# Patient Record
Sex: Female | Born: 1935 | Race: White | Hispanic: No | Marital: Married | Smoking: Former smoker
Health system: Southern US, Community
[De-identification: ages and names within clinical notes are randomized; demographics above are authoritative.]

## PROBLEM LIST (undated history)

## (undated) DIAGNOSIS — E039 Hypothyroidism, unspecified: Secondary | ICD-10-CM

## (undated) DIAGNOSIS — K589 Irritable bowel syndrome without diarrhea: Secondary | ICD-10-CM

## (undated) DIAGNOSIS — K649 Unspecified hemorrhoids: Secondary | ICD-10-CM

## (undated) DIAGNOSIS — N3281 Overactive bladder: Secondary | ICD-10-CM

## (undated) DIAGNOSIS — M1990 Unspecified osteoarthritis, unspecified site: Secondary | ICD-10-CM

## (undated) DIAGNOSIS — M7120 Synovial cyst of popliteal space [Baker], unspecified knee: Secondary | ICD-10-CM

## (undated) DIAGNOSIS — E785 Hyperlipidemia, unspecified: Secondary | ICD-10-CM

## (undated) DIAGNOSIS — K635 Polyp of colon: Secondary | ICD-10-CM

## (undated) DIAGNOSIS — S83209A Unspecified tear of unspecified meniscus, current injury, unspecified knee, initial encounter: Secondary | ICD-10-CM

## (undated) DIAGNOSIS — I509 Heart failure, unspecified: Secondary | ICD-10-CM

## (undated) DIAGNOSIS — K219 Gastro-esophageal reflux disease without esophagitis: Secondary | ICD-10-CM

## (undated) DIAGNOSIS — R011 Cardiac murmur, unspecified: Secondary | ICD-10-CM

## (undated) DIAGNOSIS — M461 Sacroiliitis, not elsewhere classified: Secondary | ICD-10-CM

## (undated) DIAGNOSIS — M47816 Spondylosis without myelopathy or radiculopathy, lumbar region: Secondary | ICD-10-CM

## (undated) DIAGNOSIS — I341 Nonrheumatic mitral (valve) prolapse: Secondary | ICD-10-CM

## (undated) DIAGNOSIS — K2970 Gastritis, unspecified, without bleeding: Secondary | ICD-10-CM

## (undated) DIAGNOSIS — N952 Postmenopausal atrophic vaginitis: Secondary | ICD-10-CM

## (undated) DIAGNOSIS — D219 Benign neoplasm of connective and other soft tissue, unspecified: Secondary | ICD-10-CM

## (undated) DIAGNOSIS — E041 Nontoxic single thyroid nodule: Secondary | ICD-10-CM

## (undated) DIAGNOSIS — E079 Disorder of thyroid, unspecified: Secondary | ICD-10-CM

## (undated) DIAGNOSIS — J45909 Unspecified asthma, uncomplicated: Secondary | ICD-10-CM

## (undated) HISTORY — DX: Synovial cyst of popliteal space (Baker), unspecified knee: M71.20

## (undated) HISTORY — DX: Unspecified tear of unspecified meniscus, current injury, unspecified knee, initial encounter: S83.209A

## (undated) HISTORY — DX: Nonrheumatic mitral (valve) prolapse: I34.1

## (undated) HISTORY — DX: Benign neoplasm of connective and other soft tissue, unspecified: D21.9

## (undated) HISTORY — DX: Polyp of colon: K63.5

## (undated) HISTORY — PX: EYE SURGERY: SHX253

## (undated) HISTORY — DX: Gastritis, unspecified, without bleeding: K29.70

## (undated) HISTORY — DX: Gastro-esophageal reflux disease without esophagitis: K21.9

## (undated) HISTORY — DX: Unspecified hemorrhoids: K64.9

## (undated) HISTORY — DX: Spondylosis without myelopathy or radiculopathy, lumbar region: M47.816

## (undated) HISTORY — DX: Overactive bladder: N32.81

## (undated) HISTORY — PX: TONSILLECTOMY: SUR1361

## (undated) HISTORY — DX: Postmenopausal atrophic vaginitis: N95.2

## (undated) HISTORY — DX: Cardiac murmur, unspecified: R01.1

## (undated) HISTORY — DX: Irritable bowel syndrome, unspecified: K58.9

## (undated) HISTORY — DX: Hyperlipidemia, unspecified: E78.5

## (undated) HISTORY — DX: Unspecified osteoarthritis, unspecified site: M19.90

## (undated) HISTORY — DX: Sacroiliitis, not elsewhere classified: M46.1

## (undated) HISTORY — DX: Nontoxic single thyroid nodule: E04.1

## (undated) HISTORY — PX: CATARACT EXTRACTION, BILATERAL: SHX1313

## (undated) HISTORY — DX: Disorder of thyroid, unspecified: E07.9

---

## 1993-10-05 ENCOUNTER — Encounter (INDEPENDENT_AMBULATORY_CARE_PROVIDER_SITE_OTHER): Payer: Self-pay | Admitting: *Deleted

## 1998-03-11 ENCOUNTER — Encounter (INDEPENDENT_AMBULATORY_CARE_PROVIDER_SITE_OTHER): Payer: Self-pay | Admitting: *Deleted

## 1999-01-12 ENCOUNTER — Other Ambulatory Visit: Admission: RE | Admit: 1999-01-12 | Discharge: 1999-01-12 | Payer: Self-pay | Admitting: Obstetrics and Gynecology

## 1999-04-21 ENCOUNTER — Ambulatory Visit (HOSPITAL_COMMUNITY): Admission: RE | Admit: 1999-04-21 | Discharge: 1999-04-21 | Payer: Self-pay | Admitting: Gastroenterology

## 1999-04-21 ENCOUNTER — Encounter: Payer: Self-pay | Admitting: Gastroenterology

## 1999-06-11 ENCOUNTER — Encounter (INDEPENDENT_AMBULATORY_CARE_PROVIDER_SITE_OTHER): Payer: Self-pay | Admitting: *Deleted

## 2000-01-01 ENCOUNTER — Other Ambulatory Visit: Admission: RE | Admit: 2000-01-01 | Discharge: 2000-01-01 | Payer: Self-pay | Admitting: Obstetrics and Gynecology

## 2000-03-17 ENCOUNTER — Encounter: Admission: RE | Admit: 2000-03-17 | Discharge: 2000-03-17 | Payer: Self-pay | Admitting: Obstetrics and Gynecology

## 2000-03-17 ENCOUNTER — Encounter: Payer: Self-pay | Admitting: Obstetrics and Gynecology

## 2001-01-03 ENCOUNTER — Other Ambulatory Visit: Admission: RE | Admit: 2001-01-03 | Discharge: 2001-01-03 | Payer: Self-pay | Admitting: Obstetrics and Gynecology

## 2001-03-20 ENCOUNTER — Encounter: Payer: Self-pay | Admitting: Obstetrics and Gynecology

## 2001-03-20 ENCOUNTER — Encounter: Admission: RE | Admit: 2001-03-20 | Discharge: 2001-03-20 | Payer: Self-pay | Admitting: Obstetrics and Gynecology

## 2001-06-27 ENCOUNTER — Encounter (INDEPENDENT_AMBULATORY_CARE_PROVIDER_SITE_OTHER): Payer: Self-pay | Admitting: Gastroenterology

## 2002-01-08 ENCOUNTER — Other Ambulatory Visit: Admission: RE | Admit: 2002-01-08 | Discharge: 2002-01-08 | Payer: Self-pay | Admitting: Obstetrics and Gynecology

## 2002-03-22 ENCOUNTER — Encounter: Admission: RE | Admit: 2002-03-22 | Discharge: 2002-03-22 | Payer: Self-pay | Admitting: Obstetrics and Gynecology

## 2002-03-22 ENCOUNTER — Encounter: Payer: Self-pay | Admitting: Obstetrics and Gynecology

## 2002-08-24 ENCOUNTER — Encounter (INDEPENDENT_AMBULATORY_CARE_PROVIDER_SITE_OTHER): Payer: Self-pay | Admitting: *Deleted

## 2002-08-24 ENCOUNTER — Encounter: Admission: RE | Admit: 2002-08-24 | Discharge: 2002-08-24 | Payer: Self-pay | Admitting: Gastroenterology

## 2002-08-24 ENCOUNTER — Encounter: Payer: Self-pay | Admitting: Gastroenterology

## 2002-09-04 ENCOUNTER — Encounter (INDEPENDENT_AMBULATORY_CARE_PROVIDER_SITE_OTHER): Payer: Self-pay | Admitting: Gastroenterology

## 2002-10-22 ENCOUNTER — Encounter: Admission: RE | Admit: 2002-10-22 | Discharge: 2002-10-22 | Payer: Self-pay | Admitting: Neurology

## 2002-10-22 ENCOUNTER — Encounter: Payer: Self-pay | Admitting: Neurology

## 2002-11-15 ENCOUNTER — Encounter: Admission: RE | Admit: 2002-11-15 | Discharge: 2002-12-13 | Payer: Self-pay | Admitting: Neurology

## 2003-03-12 ENCOUNTER — Other Ambulatory Visit: Admission: RE | Admit: 2003-03-12 | Discharge: 2003-03-12 | Payer: Self-pay | Admitting: Obstetrics and Gynecology

## 2003-03-26 ENCOUNTER — Encounter: Payer: Self-pay | Admitting: Obstetrics and Gynecology

## 2003-03-26 ENCOUNTER — Encounter: Admission: RE | Admit: 2003-03-26 | Discharge: 2003-03-26 | Payer: Self-pay | Admitting: Obstetrics and Gynecology

## 2003-04-25 ENCOUNTER — Observation Stay (HOSPITAL_COMMUNITY): Admission: EM | Admit: 2003-04-25 | Discharge: 2003-04-25 | Payer: Self-pay | Admitting: Emergency Medicine

## 2003-04-25 ENCOUNTER — Encounter: Payer: Self-pay | Admitting: Emergency Medicine

## 2004-03-17 ENCOUNTER — Other Ambulatory Visit: Admission: RE | Admit: 2004-03-17 | Discharge: 2004-03-17 | Payer: Self-pay | Admitting: Obstetrics and Gynecology

## 2004-03-31 ENCOUNTER — Encounter: Admission: RE | Admit: 2004-03-31 | Discharge: 2004-03-31 | Payer: Self-pay | Admitting: Obstetrics and Gynecology

## 2004-10-19 ENCOUNTER — Ambulatory Visit: Payer: Self-pay | Admitting: Internal Medicine

## 2004-12-16 ENCOUNTER — Ambulatory Visit: Payer: Self-pay | Admitting: Gastroenterology

## 2005-01-08 ENCOUNTER — Ambulatory Visit (HOSPITAL_COMMUNITY): Admission: RE | Admit: 2005-01-08 | Discharge: 2005-01-08 | Payer: Self-pay | Admitting: Gastroenterology

## 2005-01-08 ENCOUNTER — Encounter (INDEPENDENT_AMBULATORY_CARE_PROVIDER_SITE_OTHER): Payer: Self-pay | Admitting: Gastroenterology

## 2005-04-02 ENCOUNTER — Ambulatory Visit: Payer: Self-pay | Admitting: Internal Medicine

## 2005-04-06 ENCOUNTER — Ambulatory Visit: Payer: Self-pay | Admitting: Internal Medicine

## 2005-04-20 ENCOUNTER — Encounter: Admission: RE | Admit: 2005-04-20 | Discharge: 2005-04-20 | Payer: Self-pay | Admitting: Obstetrics and Gynecology

## 2005-05-03 ENCOUNTER — Ambulatory Visit: Payer: Self-pay | Admitting: Internal Medicine

## 2005-05-07 ENCOUNTER — Ambulatory Visit: Payer: Self-pay | Admitting: Internal Medicine

## 2005-05-10 ENCOUNTER — Ambulatory Visit: Payer: Self-pay | Admitting: Internal Medicine

## 2005-05-10 ENCOUNTER — Ambulatory Visit: Payer: Self-pay | Admitting: Cardiology

## 2005-05-13 ENCOUNTER — Ambulatory Visit: Payer: Self-pay | Admitting: Endocrinology

## 2005-05-17 ENCOUNTER — Ambulatory Visit: Payer: Self-pay | Admitting: Endocrinology

## 2005-05-17 ENCOUNTER — Ambulatory Visit: Payer: Self-pay | Admitting: Internal Medicine

## 2005-06-07 ENCOUNTER — Ambulatory Visit: Payer: Self-pay | Admitting: Endocrinology

## 2005-06-07 ENCOUNTER — Ambulatory Visit: Payer: Self-pay | Admitting: Internal Medicine

## 2005-06-07 ENCOUNTER — Other Ambulatory Visit: Admission: RE | Admit: 2005-06-07 | Discharge: 2005-06-07 | Payer: Self-pay | Admitting: Obstetrics and Gynecology

## 2005-06-14 ENCOUNTER — Ambulatory Visit: Payer: Self-pay | Admitting: Internal Medicine

## 2005-06-30 ENCOUNTER — Ambulatory Visit: Payer: Self-pay | Admitting: Gastroenterology

## 2005-09-29 ENCOUNTER — Ambulatory Visit: Payer: Self-pay | Admitting: Internal Medicine

## 2005-10-04 ENCOUNTER — Ambulatory Visit: Payer: Self-pay | Admitting: Internal Medicine

## 2006-05-11 ENCOUNTER — Encounter: Admission: RE | Admit: 2006-05-11 | Discharge: 2006-05-11 | Payer: Self-pay | Admitting: Obstetrics and Gynecology

## 2006-05-30 ENCOUNTER — Ambulatory Visit: Payer: Self-pay | Admitting: Internal Medicine

## 2006-06-08 ENCOUNTER — Other Ambulatory Visit: Admission: RE | Admit: 2006-06-08 | Discharge: 2006-06-08 | Payer: Self-pay | Admitting: Obstetrics and Gynecology

## 2006-06-08 ENCOUNTER — Ambulatory Visit: Payer: Self-pay | Admitting: Internal Medicine

## 2006-07-14 ENCOUNTER — Ambulatory Visit: Payer: Self-pay | Admitting: *Deleted

## 2006-08-12 ENCOUNTER — Encounter (INDEPENDENT_AMBULATORY_CARE_PROVIDER_SITE_OTHER): Payer: Self-pay | Admitting: *Deleted

## 2006-08-12 ENCOUNTER — Ambulatory Visit (HOSPITAL_BASED_OUTPATIENT_CLINIC_OR_DEPARTMENT_OTHER): Admission: RE | Admit: 2006-08-12 | Discharge: 2006-08-12 | Payer: Self-pay | Admitting: Plastic Surgery

## 2006-09-14 ENCOUNTER — Ambulatory Visit: Payer: Self-pay | Admitting: Gastroenterology

## 2006-09-26 ENCOUNTER — Ambulatory Visit: Payer: Self-pay | Admitting: Internal Medicine

## 2006-11-10 ENCOUNTER — Ambulatory Visit: Payer: Self-pay | Admitting: Gastroenterology

## 2006-11-10 LAB — HM COLONOSCOPY

## 2006-12-05 ENCOUNTER — Ambulatory Visit: Payer: Self-pay | Admitting: Internal Medicine

## 2006-12-05 LAB — CONVERTED CEMR LAB
BUN: 19 mg/dL (ref 6–23)
Chol/HDL Ratio, serum: 2.6
Cholesterol: 135 mg/dL (ref 0–200)
Creatinine, Ser: 0.7 mg/dL (ref 0.4–1.2)
Glucose, Bld: 97 mg/dL (ref 70–99)
HDL: 51.1 mg/dL (ref >39.0)
Hgb A1c MFr Bld: 6 % (ref 4.6–6.0)
LDL Cholesterol: 63 mg/dL (ref 0–99)
Potassium: 3.9 meq/L (ref 3.5–5.1)
Triglyceride fasting, serum: 105 mg/dL (ref 0–149)
VLDL: 21 mg/dL (ref 0–40)

## 2006-12-09 ENCOUNTER — Ambulatory Visit: Payer: Self-pay | Admitting: Internal Medicine

## 2006-12-12 ENCOUNTER — Ambulatory Visit: Payer: Self-pay | Admitting: Internal Medicine

## 2006-12-12 LAB — CONVERTED CEMR LAB
ALT: 31 units/L (ref 0–40)
AST: 34 units/L (ref 0–37)
Bilirubin Urine: NEGATIVE
Hemoglobin, Urine: NEGATIVE
Ketones, ur: NEGATIVE mg/dL
Leukocytes, UA: NEGATIVE
Nitrite: NEGATIVE
Specific Gravity, Urine: 1.02 (ref 1.000–1.03)
TSH: 3.89 microintl units/mL (ref 0.35–5.50)
Total Bilirubin: 0.7 mg/dL (ref 0.3–1.2)
Total Protein, Urine: NEGATIVE mg/dL
Urine Glucose: NEGATIVE mg/dL
Urobilinogen, UA: 0.2 (ref 0.0–1.0)
Vit D, 1,25-Dihydroxy: 54 (ref 20–57)
pH: 6 (ref 5.0–8.0)

## 2006-12-15 ENCOUNTER — Ambulatory Visit: Payer: Self-pay | Admitting: *Deleted

## 2007-03-28 ENCOUNTER — Ambulatory Visit: Payer: Self-pay | Admitting: *Deleted

## 2007-03-30 ENCOUNTER — Ambulatory Visit: Payer: Self-pay

## 2007-04-26 ENCOUNTER — Ambulatory Visit: Payer: Self-pay | Admitting: *Deleted

## 2007-04-26 LAB — CONVERTED CEMR LAB
ALT: 22 units/L (ref 0–40)
AST: 26 units/L (ref 0–37)
Albumin: 3.6 g/dL (ref 3.5–5.2)
Alkaline Phosphatase: 39 units/L (ref 39–117)
Bilirubin, Direct: 0.1 mg/dL (ref 0.0–0.3)
Cholesterol: 178 mg/dL (ref 0–200)
HDL: 47.4 mg/dL (ref >39.0)
LDL Cholesterol: 111 mg/dL — ABNORMAL HIGH (ref 0–99)
Total Bilirubin: 0.6 mg/dL (ref 0.3–1.2)
Total CHOL/HDL Ratio: 3.8
Total Protein: 7 g/dL (ref 6.0–8.3)
Triglycerides: 96 mg/dL (ref 0–149)
VLDL: 19 mg/dL (ref 0–40)

## 2007-05-03 ENCOUNTER — Ambulatory Visit: Payer: Self-pay | Admitting: *Deleted

## 2007-05-17 ENCOUNTER — Encounter: Admission: RE | Admit: 2007-05-17 | Discharge: 2007-05-17 | Payer: Self-pay | Admitting: Obstetrics and Gynecology

## 2007-06-14 ENCOUNTER — Other Ambulatory Visit: Admission: RE | Admit: 2007-06-14 | Discharge: 2007-06-14 | Payer: Self-pay | Admitting: Obstetrics and Gynecology

## 2007-08-03 ENCOUNTER — Ambulatory Visit: Payer: Self-pay | Admitting: Internal Medicine

## 2007-08-03 LAB — CONVERTED CEMR LAB
ALT: 29 units/L (ref 0–35)
AST: 28 units/L (ref 0–37)
Albumin: 3.8 g/dL (ref 3.5–5.2)
Alkaline Phosphatase: 41 units/L (ref 39–117)
Bilirubin, Direct: 0.1 mg/dL (ref 0.0–0.3)
Cholesterol: 164 mg/dL (ref 0–200)
HDL: 50.1 mg/dL (ref >39.0)
LDL Cholesterol: 98 mg/dL (ref 0–99)
Total Bilirubin: 0.7 mg/dL (ref 0.3–1.2)
Total CHOL/HDL Ratio: 3.3
Total Protein: 7.5 g/dL (ref 6.0–8.3)
Triglycerides: 82 mg/dL (ref 0–149)
VLDL: 16 mg/dL (ref 0–40)

## 2007-08-08 ENCOUNTER — Ambulatory Visit: Payer: Self-pay | Admitting: Internal Medicine

## 2007-08-08 LAB — CONVERTED CEMR LAB
ALT: 26 units/L (ref 0–35)
AST: 26 units/L (ref 0–37)
Albumin: 4 g/dL (ref 3.5–5.2)
Alkaline Phosphatase: 47 units/L (ref 39–117)
BUN: 21 mg/dL (ref 6–23)
Basophils Absolute: 0 10*3/uL (ref 0.0–0.1)
Basophils Relative: 0.6 % (ref 0.0–1.0)
Bilirubin Urine: NEGATIVE
Bilirubin, Direct: 0.1 mg/dL (ref 0.0–0.3)
CO2: 31 meq/L (ref 19–32)
Calcium: 9.8 mg/dL (ref 8.4–10.5)
Chloride: 102 meq/L (ref 96–112)
Creatinine, Ser: 0.7 mg/dL (ref 0.4–1.2)
Crystals: NEGATIVE
Eosinophils Absolute: 0.1 10*3/uL (ref 0.0–0.6)
Eosinophils Relative: 1.8 % (ref 0.0–5.0)
FSH: 60 milliintl units/mL
GFR calc Af Amer: 107 mL/min
GFR calc non Af Amer: 88 mL/min
Glucose, Bld: 91 mg/dL (ref 70–99)
HCT: 37.7 % (ref 36.0–46.0)
Hemoglobin, Urine: NEGATIVE
Hemoglobin: 13 g/dL (ref 12.0–15.0)
Ketones, ur: NEGATIVE mg/dL
Leukocytes, UA: NEGATIVE
Lymphocytes Relative: 25.7 % (ref 12.0–46.0)
MCHC: 34.5 g/dL (ref 30.0–36.0)
MCV: 92 fL (ref 78.0–100.0)
Monocytes Absolute: 0.6 10*3/uL (ref 0.2–0.7)
Monocytes Relative: 9.4 % (ref 3.0–11.0)
Mucus, UA: NEGATIVE
Neutro Abs: 4.4 10*3/uL (ref 1.4–7.7)
Neutrophils Relative %: 62.5 % (ref 43.0–77.0)
Nitrite: NEGATIVE
Platelets: 292 10*3/uL (ref 150–400)
Potassium: 3.9 meq/L (ref 3.5–5.1)
RBC / HPF: NONE SEEN
RBC: 4.1 M/uL (ref 3.87–5.11)
RDW: 12.2 % (ref 11.5–14.6)
Sed Rate: 23 mm/hr (ref 0–25)
Sodium: 141 meq/L (ref 135–145)
Specific Gravity, Urine: 1.015 (ref 1.000–1.03)
TSH: 4.18 microintl units/mL (ref 0.35–5.50)
Total Bilirubin: 0.7 mg/dL (ref 0.3–1.2)
Total Protein, Urine: NEGATIVE mg/dL
Total Protein: 7.7 g/dL (ref 6.0–8.3)
Urine Glucose: NEGATIVE mg/dL
Urobilinogen, UA: 0.2 (ref 0.0–1.0)
WBC, UA: NONE SEEN cells/hpf
WBC: 6.8 10*3/uL (ref 4.5–10.5)
pH: 6.5 (ref 5.0–8.0)

## 2007-09-13 ENCOUNTER — Ambulatory Visit: Payer: Self-pay | Admitting: Internal Medicine

## 2007-09-20 ENCOUNTER — Ambulatory Visit: Payer: Self-pay

## 2007-09-29 ENCOUNTER — Ambulatory Visit: Payer: Self-pay | Admitting: Internal Medicine

## 2007-11-20 ENCOUNTER — Encounter: Payer: Self-pay | Admitting: Internal Medicine

## 2007-11-20 DIAGNOSIS — M810 Age-related osteoporosis without current pathological fracture: Secondary | ICD-10-CM

## 2007-11-20 DIAGNOSIS — K219 Gastro-esophageal reflux disease without esophagitis: Secondary | ICD-10-CM

## 2007-11-20 DIAGNOSIS — E739 Lactose intolerance, unspecified: Secondary | ICD-10-CM

## 2007-11-20 DIAGNOSIS — E785 Hyperlipidemia, unspecified: Secondary | ICD-10-CM | POA: Insufficient documentation

## 2007-11-20 HISTORY — DX: Lactose intolerance, unspecified: E73.9

## 2007-11-20 HISTORY — DX: Gastro-esophageal reflux disease without esophagitis: K21.9

## 2007-11-20 HISTORY — DX: Age-related osteoporosis without current pathological fracture: M81.0

## 2008-01-01 ENCOUNTER — Encounter: Payer: Self-pay | Admitting: Internal Medicine

## 2008-02-20 ENCOUNTER — Telehealth: Payer: Self-pay | Admitting: Internal Medicine

## 2008-02-21 ENCOUNTER — Ambulatory Visit: Payer: Self-pay | Admitting: Internal Medicine

## 2008-02-21 DIAGNOSIS — R61 Generalized hyperhidrosis: Secondary | ICD-10-CM | POA: Insufficient documentation

## 2008-02-21 LAB — CONVERTED CEMR LAB
ALT: 27 units/L (ref 0–35)
AST: 28 units/L (ref 0–37)
Albumin: 3.8 g/dL (ref 3.5–5.2)
Alkaline Phosphatase: 38 units/L — ABNORMAL LOW (ref 39–117)
BUN: 18 mg/dL (ref 6–23)
Bilirubin, Direct: 0.1 mg/dL (ref 0.0–0.3)
CO2: 30 meq/L (ref 19–32)
Calcium: 9.1 mg/dL (ref 8.4–10.5)
Chloride: 107 meq/L (ref 96–112)
Cholesterol: 144 mg/dL (ref 0–200)
Creatinine, Ser: 0.8 mg/dL (ref 0.4–1.2)
GFR calc Af Amer: 91 mL/min
GFR calc non Af Amer: 76 mL/min
Glucose, Bld: 97 mg/dL (ref 70–99)
HDL: 43.8 mg/dL (ref >39.0)
LDL Cholesterol: 81 mg/dL (ref 0–99)
Potassium: 4.5 meq/L (ref 3.5–5.1)
Sodium: 141 meq/L (ref 135–145)
TSH: 3.98 microintl units/mL (ref 0.35–5.50)
Total Bilirubin: 0.7 mg/dL (ref 0.3–1.2)
Total CHOL/HDL Ratio: 3.3
Total Protein: 7.2 g/dL (ref 6.0–8.3)
Triglycerides: 94 mg/dL (ref 0–149)
VLDL: 19 mg/dL (ref 0–40)

## 2008-02-25 DIAGNOSIS — M1990 Unspecified osteoarthritis, unspecified site: Secondary | ICD-10-CM

## 2008-02-25 HISTORY — DX: Unspecified osteoarthritis, unspecified site: M19.90

## 2008-03-20 ENCOUNTER — Ambulatory Visit: Payer: Self-pay | Admitting: Sports Medicine

## 2008-03-20 DIAGNOSIS — M76829 Posterior tibial tendinitis, unspecified leg: Secondary | ICD-10-CM | POA: Insufficient documentation

## 2008-03-20 HISTORY — DX: Posterior tibial tendinitis, unspecified leg: M76.829

## 2008-03-29 ENCOUNTER — Ambulatory Visit: Payer: Self-pay | Admitting: Sports Medicine

## 2008-04-10 ENCOUNTER — Ambulatory Visit: Payer: Self-pay | Admitting: Internal Medicine

## 2008-05-20 ENCOUNTER — Encounter: Admission: RE | Admit: 2008-05-20 | Discharge: 2008-05-20 | Payer: Self-pay | Admitting: Obstetrics and Gynecology

## 2008-05-30 ENCOUNTER — Encounter: Payer: Self-pay | Admitting: Internal Medicine

## 2008-06-17 ENCOUNTER — Other Ambulatory Visit: Admission: RE | Admit: 2008-06-17 | Discharge: 2008-06-17 | Payer: Self-pay | Admitting: Obstetrics and Gynecology

## 2008-08-21 ENCOUNTER — Ambulatory Visit: Payer: Self-pay | Admitting: Internal Medicine

## 2008-08-22 ENCOUNTER — Ambulatory Visit: Payer: Self-pay | Admitting: Internal Medicine

## 2008-08-23 LAB — CONVERTED CEMR LAB
ALT: 26 units/L (ref 0–35)
AST: 30 units/L (ref 0–37)
Albumin: 3.9 g/dL (ref 3.5–5.2)
Alkaline Phosphatase: 43 units/L (ref 39–117)
BUN: 18 mg/dL (ref 6–23)
Basophils Absolute: 0 10*3/uL (ref 0.0–0.1)
Basophils Relative: 0.4 % (ref 0.0–3.0)
Bilirubin Urine: NEGATIVE
Bilirubin, Direct: 0.2 mg/dL (ref 0.0–0.3)
CO2: 30 meq/L (ref 19–32)
Calcium: 9.2 mg/dL (ref 8.4–10.5)
Chloride: 107 meq/L (ref 96–112)
Cholesterol: 175 mg/dL (ref 0–200)
Creatinine, Ser: 0.8 mg/dL (ref 0.4–1.2)
Eosinophils Absolute: 0.2 10*3/uL (ref 0.0–0.7)
Eosinophils Relative: 3.1 % (ref 0.0–5.0)
GFR calc Af Amer: 91 mL/min
GFR calc non Af Amer: 75 mL/min
Glucose, Bld: 93 mg/dL (ref 70–99)
HCT: 40.6 % (ref 36.0–46.0)
HDL: 53.8 mg/dL (ref >39.0)
Hemoglobin, Urine: NEGATIVE
Hemoglobin: 13.9 g/dL (ref 12.0–15.0)
Ketones, ur: NEGATIVE mg/dL
LDL Cholesterol: 100 mg/dL — ABNORMAL HIGH (ref 0–99)
Leukocytes, UA: NEGATIVE
Lymphocytes Relative: 15.8 % (ref 12.0–46.0)
MCHC: 34.1 g/dL (ref 30.0–36.0)
MCV: 92.6 fL (ref 78.0–100.0)
Monocytes Absolute: 0.7 10*3/uL (ref 0.1–1.0)
Monocytes Relative: 10.5 % (ref 3.0–12.0)
Neutro Abs: 4.5 10*3/uL (ref 1.4–7.7)
Neutrophils Relative %: 70.2 % (ref 43.0–77.0)
Nitrite: NEGATIVE
Platelets: 275 10*3/uL (ref 150–400)
Potassium: 4.4 meq/L (ref 3.5–5.1)
RBC: 4.39 M/uL (ref 3.87–5.11)
RDW: 13 % (ref 11.5–14.6)
Sodium: 142 meq/L (ref 135–145)
Specific Gravity, Urine: <=1.005 (ref 1.000–1.03)
TSH: 4.06 microintl units/mL (ref 0.35–5.50)
Total Bilirubin: 0.8 mg/dL (ref 0.3–1.2)
Total CHOL/HDL Ratio: 3.3
Total Protein, Urine: NEGATIVE mg/dL
Total Protein: 7.1 g/dL (ref 6.0–8.3)
Triglycerides: 105 mg/dL (ref 0–149)
Urine Glucose: NEGATIVE mg/dL
Urobilinogen, UA: 0.2 (ref 0.0–1.0)
VLDL: 21 mg/dL (ref 0–40)
WBC: 6.4 10*3/uL (ref 4.5–10.5)
pH: 7 (ref 5.0–8.0)

## 2008-09-03 DIAGNOSIS — K2940 Chronic atrophic gastritis without bleeding: Secondary | ICD-10-CM

## 2008-09-03 DIAGNOSIS — K2980 Duodenitis without bleeding: Secondary | ICD-10-CM | POA: Insufficient documentation

## 2008-09-03 HISTORY — DX: Chronic atrophic gastritis without bleeding: K29.40

## 2008-09-03 HISTORY — DX: Duodenitis without bleeding: K29.80

## 2008-09-04 ENCOUNTER — Ambulatory Visit: Payer: Self-pay | Admitting: Internal Medicine

## 2008-09-04 DIAGNOSIS — Z860100 Personal history of colon polyps, unspecified: Secondary | ICD-10-CM

## 2008-09-04 DIAGNOSIS — K589 Irritable bowel syndrome without diarrhea: Secondary | ICD-10-CM | POA: Insufficient documentation

## 2008-09-04 HISTORY — DX: Personal history of colon polyps, unspecified: Z86.0100

## 2008-09-04 HISTORY — DX: Irritable bowel syndrome, unspecified: K58.9

## 2008-10-01 ENCOUNTER — Encounter: Payer: Self-pay | Admitting: Internal Medicine

## 2008-10-23 ENCOUNTER — Ambulatory Visit: Payer: Self-pay | Admitting: Obstetrics and Gynecology

## 2008-10-25 ENCOUNTER — Ambulatory Visit: Payer: Self-pay | Admitting: Internal Medicine

## 2008-11-04 ENCOUNTER — Ambulatory Visit: Payer: Self-pay

## 2008-11-04 ENCOUNTER — Encounter: Payer: Self-pay | Admitting: Internal Medicine

## 2008-12-19 ENCOUNTER — Telehealth: Payer: Self-pay | Admitting: Internal Medicine

## 2008-12-19 ENCOUNTER — Ambulatory Visit: Payer: Self-pay | Admitting: Internal Medicine

## 2008-12-19 DIAGNOSIS — H9209 Otalgia, unspecified ear: Secondary | ICD-10-CM | POA: Insufficient documentation

## 2008-12-19 DIAGNOSIS — J309 Allergic rhinitis, unspecified: Secondary | ICD-10-CM | POA: Insufficient documentation

## 2009-02-12 ENCOUNTER — Ambulatory Visit: Payer: Self-pay | Admitting: Internal Medicine

## 2009-02-13 LAB — CONVERTED CEMR LAB
ALT: 30 units/L (ref 0–35)
AST: 31 units/L (ref 0–37)
Albumin: 3.9 g/dL (ref 3.5–5.2)
Alkaline Phosphatase: 45 units/L (ref 39–117)
BUN: 19 mg/dL (ref 6–23)
Basophils Absolute: 0.1 10*3/uL (ref 0.0–0.1)
Basophils Relative: 1.1 % (ref 0.0–3.0)
Bilirubin Urine: NEGATIVE
Bilirubin, Direct: 0.1 mg/dL (ref 0.0–0.3)
CO2: 30 meq/L (ref 19–32)
Calcium: 9.2 mg/dL (ref 8.4–10.5)
Chloride: 104 meq/L (ref 96–112)
Cholesterol: 174 mg/dL (ref 0–200)
Creatinine, Ser: 0.7 mg/dL (ref 0.4–1.2)
Eosinophils Absolute: 0.1 10*3/uL (ref 0.0–0.7)
Eosinophils Relative: 2.7 % (ref 0.0–5.0)
GFR calc non Af Amer: 87.77 mL/min (ref >60)
Glucose, Bld: 111 mg/dL — ABNORMAL HIGH (ref 70–99)
HCT: 39.5 % (ref 36.0–46.0)
HDL: 47.4 mg/dL (ref >39.00)
Hemoglobin, Urine: NEGATIVE
Hemoglobin: 13.4 g/dL (ref 12.0–15.0)
Ketones, ur: NEGATIVE mg/dL
LDL Cholesterol: 109 mg/dL — ABNORMAL HIGH (ref 0–99)
Leukocytes, UA: NEGATIVE
Lymphocytes Relative: 21.1 % (ref 12.0–46.0)
Lymphs Abs: 1.1 10*3/uL (ref 0.7–4.0)
MCHC: 33.8 g/dL (ref 30.0–36.0)
MCV: 93.9 fL (ref 78.0–100.0)
Monocytes Absolute: 0.5 10*3/uL (ref 0.1–1.0)
Monocytes Relative: 9.9 % (ref 3.0–12.0)
Neutro Abs: 3.3 10*3/uL (ref 1.4–7.7)
Neutrophils Relative %: 65.2 % (ref 43.0–77.0)
Nitrite: NEGATIVE
Platelets: 234 10*3/uL (ref 150.0–400.0)
Potassium: 4.6 meq/L (ref 3.5–5.1)
RBC: 4.21 M/uL (ref 3.87–5.11)
RDW: 12.3 % (ref 11.5–14.6)
Sodium: 140 meq/L (ref 135–145)
Specific Gravity, Urine: 1.02 (ref 1.000–1.030)
TSH: 3.88 microintl units/mL (ref 0.35–5.50)
Total Bilirubin: 0.6 mg/dL (ref 0.3–1.2)
Total CHOL/HDL Ratio: 4
Total Protein, Urine: NEGATIVE mg/dL
Total Protein: 7.3 g/dL (ref 6.0–8.3)
Triglycerides: 88 mg/dL (ref 0.0–149.0)
Urine Glucose: NEGATIVE mg/dL
Urobilinogen, UA: 0.2 (ref 0.0–1.0)
VLDL: 17.6 mg/dL (ref 0.0–40.0)
WBC: 5.1 10*3/uL (ref 4.5–10.5)
pH: 5.5 (ref 5.0–8.0)

## 2009-02-19 ENCOUNTER — Ambulatory Visit: Payer: Self-pay | Admitting: Internal Medicine

## 2009-02-19 DIAGNOSIS — R05 Cough: Secondary | ICD-10-CM | POA: Insufficient documentation

## 2009-02-19 DIAGNOSIS — R059 Cough, unspecified: Secondary | ICD-10-CM | POA: Insufficient documentation

## 2009-02-27 ENCOUNTER — Encounter: Payer: Self-pay | Admitting: Internal Medicine

## 2009-05-21 ENCOUNTER — Encounter: Admission: RE | Admit: 2009-05-21 | Discharge: 2009-05-21 | Payer: Self-pay | Admitting: Obstetrics and Gynecology

## 2009-05-29 ENCOUNTER — Encounter: Admission: RE | Admit: 2009-05-29 | Discharge: 2009-05-29 | Payer: Self-pay | Admitting: Obstetrics and Gynecology

## 2009-07-15 ENCOUNTER — Telehealth: Payer: Self-pay | Admitting: Internal Medicine

## 2009-07-21 ENCOUNTER — Ambulatory Visit: Payer: Self-pay | Admitting: Internal Medicine

## 2009-07-21 DIAGNOSIS — R143 Flatulence: Secondary | ICD-10-CM

## 2009-07-21 DIAGNOSIS — R142 Eructation: Secondary | ICD-10-CM | POA: Insufficient documentation

## 2009-07-21 DIAGNOSIS — R141 Gas pain: Secondary | ICD-10-CM

## 2009-09-09 ENCOUNTER — Telehealth: Payer: Self-pay | Admitting: Internal Medicine

## 2009-09-29 ENCOUNTER — Encounter: Payer: Self-pay | Admitting: Internal Medicine

## 2009-10-02 ENCOUNTER — Encounter: Admission: RE | Admit: 2009-10-02 | Discharge: 2009-10-02 | Payer: Self-pay | Admitting: Neurosurgery

## 2009-10-03 ENCOUNTER — Encounter: Payer: Self-pay | Admitting: Internal Medicine

## 2009-10-27 ENCOUNTER — Encounter: Payer: Self-pay | Admitting: Cardiovascular Disease

## 2009-10-27 ENCOUNTER — Ambulatory Visit: Payer: Self-pay

## 2009-10-27 DIAGNOSIS — M79609 Pain in unspecified limb: Secondary | ICD-10-CM | POA: Insufficient documentation

## 2010-02-04 ENCOUNTER — Other Ambulatory Visit: Admission: RE | Admit: 2010-02-04 | Discharge: 2010-02-04 | Payer: Self-pay | Admitting: Obstetrics and Gynecology

## 2010-02-04 ENCOUNTER — Ambulatory Visit: Payer: Self-pay | Admitting: Obstetrics and Gynecology

## 2010-02-17 ENCOUNTER — Ambulatory Visit: Payer: Self-pay | Admitting: Obstetrics and Gynecology

## 2010-02-24 ENCOUNTER — Ambulatory Visit: Payer: Self-pay | Admitting: Internal Medicine

## 2010-02-26 ENCOUNTER — Ambulatory Visit: Payer: Self-pay | Admitting: Internal Medicine

## 2010-02-27 LAB — CONVERTED CEMR LAB
ALT: 21 units/L (ref 0–35)
AST: 26 units/L (ref 0–37)
Albumin: 3.9 g/dL (ref 3.5–5.2)
Alkaline Phosphatase: 42 units/L (ref 39–117)
BUN: 14 mg/dL (ref 6–23)
Basophils Absolute: 0.1 10*3/uL (ref 0.0–0.1)
Basophils Relative: 1 % (ref 0.0–3.0)
Bilirubin Urine: NEGATIVE
Bilirubin, Direct: 0.1 mg/dL (ref 0.0–0.3)
CO2: 28 meq/L (ref 19–32)
Calcium: 9 mg/dL (ref 8.4–10.5)
Chloride: 103 meq/L (ref 96–112)
Cholesterol: 158 mg/dL (ref 0–200)
Creatinine, Ser: 0.7 mg/dL (ref 0.4–1.2)
Eosinophils Absolute: 0.2 10*3/uL (ref 0.0–0.7)
Eosinophils Relative: 2.8 % (ref 0.0–5.0)
GFR calc non Af Amer: 87.51 mL/min (ref >60)
Glucose, Bld: 101 mg/dL — ABNORMAL HIGH (ref 70–99)
HCT: 41.4 % (ref 36.0–46.0)
HDL: 56.8 mg/dL (ref >39.00)
Hemoglobin, Urine: NEGATIVE
Hemoglobin: 13.7 g/dL (ref 12.0–15.0)
Ketones, ur: NEGATIVE mg/dL
LDL Cholesterol: 85 mg/dL (ref 0–99)
Leukocytes, UA: NEGATIVE
Lymphocytes Relative: 19.3 % (ref 12.0–46.0)
Lymphs Abs: 1.2 10*3/uL (ref 0.7–4.0)
MCHC: 33 g/dL (ref 30.0–36.0)
MCV: 95 fL (ref 78.0–100.0)
Monocytes Absolute: 0.8 10*3/uL (ref 0.1–1.0)
Monocytes Relative: 12.5 % — ABNORMAL HIGH (ref 3.0–12.0)
Neutro Abs: 3.7 10*3/uL (ref 1.4–7.7)
Neutrophils Relative %: 64.4 % (ref 43.0–77.0)
Nitrite: NEGATIVE
Platelets: 264 10*3/uL (ref 150.0–400.0)
Potassium: 3.8 meq/L (ref 3.5–5.1)
RBC: 4.36 M/uL (ref 3.87–5.11)
RDW: 12.2 % (ref 11.5–14.6)
Sodium: 139 meq/L (ref 135–145)
Specific Gravity, Urine: 1.02 (ref 1.000–1.030)
TSH: 6.08 microintl units/mL — ABNORMAL HIGH (ref 0.35–5.50)
Total Bilirubin: 0.6 mg/dL (ref 0.3–1.2)
Total CHOL/HDL Ratio: 3
Total Protein, Urine: NEGATIVE mg/dL
Total Protein: 7 g/dL (ref 6.0–8.3)
Triglycerides: 82 mg/dL (ref 0.0–149.0)
Urine Glucose: NEGATIVE mg/dL
Urobilinogen, UA: 0.2 (ref 0.0–1.0)
VLDL: 16.4 mg/dL (ref 0.0–40.0)
WBC: 6 10*3/uL (ref 4.5–10.5)
pH: 5.5 (ref 5.0–8.0)

## 2010-03-03 ENCOUNTER — Ambulatory Visit: Payer: Self-pay | Admitting: Internal Medicine

## 2010-03-03 DIAGNOSIS — E039 Hypothyroidism, unspecified: Secondary | ICD-10-CM | POA: Insufficient documentation

## 2010-03-11 ENCOUNTER — Telehealth: Payer: Self-pay | Admitting: Internal Medicine

## 2010-03-18 ENCOUNTER — Ambulatory Visit: Payer: Self-pay | Admitting: Internal Medicine

## 2010-03-18 ENCOUNTER — Telehealth (INDEPENDENT_AMBULATORY_CARE_PROVIDER_SITE_OTHER): Payer: Self-pay | Admitting: *Deleted

## 2010-04-09 ENCOUNTER — Ambulatory Visit: Payer: Self-pay | Admitting: Internal Medicine

## 2010-04-14 ENCOUNTER — Ambulatory Visit: Payer: Self-pay | Admitting: Internal Medicine

## 2010-04-15 LAB — CONVERTED CEMR LAB
Free T4: 1.2 ng/dL (ref 0.6–1.6)
T3, Free: 2.6 pg/mL (ref 2.3–4.2)
TSH: 3.26 microintl units/mL (ref 0.35–5.50)

## 2010-05-05 ENCOUNTER — Telehealth: Payer: Self-pay | Admitting: Internal Medicine

## 2010-05-06 ENCOUNTER — Telehealth: Payer: Self-pay | Admitting: Internal Medicine

## 2010-06-02 ENCOUNTER — Ambulatory Visit: Payer: Self-pay | Admitting: Internal Medicine

## 2010-06-03 ENCOUNTER — Encounter: Admission: RE | Admit: 2010-06-03 | Discharge: 2010-06-03 | Payer: Self-pay | Admitting: Endocrinology

## 2010-06-03 LAB — HM MAMMOGRAPHY

## 2010-06-10 ENCOUNTER — Encounter: Payer: Self-pay | Admitting: Internal Medicine

## 2010-08-05 ENCOUNTER — Ambulatory Visit: Payer: Self-pay | Admitting: Obstetrics and Gynecology

## 2010-08-17 ENCOUNTER — Ambulatory Visit: Payer: Self-pay | Admitting: Obstetrics and Gynecology

## 2010-08-25 ENCOUNTER — Ambulatory Visit: Payer: Self-pay | Admitting: Internal Medicine

## 2010-08-25 LAB — CONVERTED CEMR LAB
ALT: 24 units/L (ref 0–35)
AST: 28 units/L (ref 0–37)
Albumin: 4.1 g/dL (ref 3.5–5.2)
Alkaline Phosphatase: 43 units/L (ref 39–117)
BUN: 18 mg/dL (ref 6–23)
Bilirubin, Direct: 0.1 mg/dL (ref 0.0–0.3)
CO2: 28 meq/L (ref 19–32)
Calcium: 9 mg/dL (ref 8.4–10.5)
Chloride: 107 meq/L (ref 96–112)
Cholesterol: 172 mg/dL (ref 0–200)
Creatinine, Ser: 0.8 mg/dL (ref 0.4–1.2)
GFR calc non Af Amer: 76 mL/min (ref >60)
Glucose, Bld: 94 mg/dL (ref 70–99)
HDL: 54.6 mg/dL (ref >39.00)
LDL Cholesterol: 99 mg/dL (ref 0–99)
Potassium: 4.5 meq/L (ref 3.5–5.1)
Sodium: 140 meq/L (ref 135–145)
TSH: 5.79 microintl units/mL — ABNORMAL HIGH (ref 0.35–5.50)
Total Bilirubin: 0.8 mg/dL (ref 0.3–1.2)
Total CHOL/HDL Ratio: 3
Total Protein: 7.2 g/dL (ref 6.0–8.3)
Triglycerides: 91 mg/dL (ref 0.0–149.0)
VLDL: 18.2 mg/dL (ref 0.0–40.0)

## 2010-09-02 ENCOUNTER — Ambulatory Visit: Payer: Self-pay | Admitting: Internal Medicine

## 2010-09-02 ENCOUNTER — Telehealth: Payer: Self-pay | Admitting: Internal Medicine

## 2010-09-02 DIAGNOSIS — N3281 Overactive bladder: Secondary | ICD-10-CM | POA: Insufficient documentation

## 2010-09-02 DIAGNOSIS — N318 Other neuromuscular dysfunction of bladder: Secondary | ICD-10-CM | POA: Insufficient documentation

## 2010-09-02 HISTORY — DX: Overactive bladder: N32.81

## 2010-11-11 ENCOUNTER — Encounter: Payer: Self-pay | Admitting: Internal Medicine

## 2010-11-11 ENCOUNTER — Ambulatory Visit: Payer: Self-pay

## 2010-11-11 ENCOUNTER — Ambulatory Visit (HOSPITAL_COMMUNITY)
Admission: RE | Admit: 2010-11-11 | Discharge: 2010-11-11 | Payer: Self-pay | Source: Home / Self Care | Attending: Internal Medicine | Admitting: Internal Medicine

## 2010-12-02 ENCOUNTER — Telehealth: Payer: Self-pay | Admitting: Internal Medicine

## 2010-12-20 ENCOUNTER — Encounter: Payer: Self-pay | Admitting: Neurosurgery

## 2010-12-31 NOTE — Assessment & Plan Note (Signed)
Summary: YEARLY CK-UP/AWARE OF FEE/LEF   Vital Signs:  Patient Profile:   75 Years Old Female Temp:     96.8 degrees F oral Pulse rate:   76 / minute BP sitting:   109 / 67  (left arm)  Vitals Entered By: Tora Perches (February 21, 2008 3:36 PM)             Is Patient Diabetic? No     Chief Complaint:  preventive Care  (pt refused weight).  History of Present Illness: The patient presents for a follow up of  hyperlipidemia, GERD. F/u on sweating.     Current Allergies (reviewed today): PRILOSEC ZANTAC  Past Medical History:    Reviewed history from 11/20/2007 and no changes required:       GERD       Osteoporosis       Hyperlipidemia       Osteoarthritis   Family History:    Reviewed history and no changes required:       M CAD  Social History:    Reviewed history and no changes required:       Married       Former Smoker       Regular exercise-yes   Risk Factors:  Tobacco use:  quit Exercise:  yes   Review of Systems  The patient denies fever, weight loss, chest pain, dyspnea on exhertion, peripheral edema, prolonged cough, abdominal pain, hematuria, difficulty walking, depression, unusual weight change, abnormal bleeding, and enlarged lymph nodes.         GERD   Physical Exam  General:     Well-developed,well-nourished,in no acute distress; alert,appropriate and cooperative throughout examination Head:     Normocephalic and atraumatic without obvious abnormalities. No apparent alopecia or balding. Eyes:     No corneal or conjunctival inflammation noted. EOMI. Perrla. Funduscopic exam benign, without hemorrhages, exudates or papilledema. Vision grossly normal. Ears:     External ear exam shows no significant lesions or deformities.  Otoscopic examination reveals clear canals, tympanic membranes are intact bilaterally without bulging, retraction, inflammation or discharge. Hearing is grossly normal bilaterally. Nose:     External nasal  examination shows no deformity or inflammation. Nasal mucosa are pink and moist without lesions or exudates. Mouth:     Oral mucosa and oropharynx without lesions or exudates.  Teeth in good repair. Neck:     No deformities, masses, or tenderness noted. Lungs:     Normal respiratory effort, chest expands symmetrically. Lungs are clear to auscultation, no crackles or wheezes. Heart:     Normal rate and regular rhythm. S1 and S2 normal without gallop, murmur, click, rub or other extra sounds. Abdomen:     Bowel sounds positive,abdomen soft and non-tender without masses, organomegaly or hernias noted. Msk:     No deformity or scoliosis noted of thoracic or lumbar spine.   Neurologic:     No cranial nerve deficits noted. Station and gait are normal. Plantar reflexes are down-going bilaterally. DTRs are symmetrical throughout. Sensory, motor and coordinative functions appear intact. Skin:     Intact without suspicious lesions or rashes Psych:     Cognition and judgment appear intact. Alert and cooperative with normal attention span and concentration. No apparent delusions, illusions, hallucinations    Impression & Recommendations:  Problem # 1:  DYSLIPIDEMIA (ICD-272.4) Assessment: Improved  Her updated medication list for this problem includes:    Zocor 20 Mg Tabs (Simvastatin) .[name]..[name] Two times a day   Problem #  2:  SWEATING (ICD-780.8) Assessment: Improved Poss menopausal - try soy suppl.  Problem # 3:  GERD (ICD-530.81) Assessment: Deteriorated  The following medications were removed from the medication list:    Zantac 75 75 Mg Tabs (Ranitidine hcl) .[name]... At bedtime  Her updated medication list for this problem includes:    Aciphex 20 Mg Tbec (Rabeprazole sodium) ..... Once daily or    Protonix 40 Mg Tbec (Pantoprazole sodium) ..... Once daily  The following medications were removed from the medication list:    Zantac 75 75 Mg Tabs (Ranitidine hcl) .[name]... At bedtime  Her  updated medication list for this problem includes:    Aciphex 20 Mg Tbec (Rabeprazole sodium) ..... Once daily    Protonix 40 Mg Tbec (Pantoprazole sodium) ..... Once daily   Complete Medication List: 1)  Folic Acid 1 Mg Tabs (Folic acid) .... Once daily 2)  Allerx Df30 4-2.5 & 8-2.5 Mg Misc (Chlorpheniramine-methscop) .... Two times a day 3)  Atrovent Hfa 17 Mcg/act Aers (Ipratropium bromide hfa) 4)  Aciphex 20 Mg Tbec (Rabeprazole sodium) .... Once daily 5)  Aspirin 81 Mg Tabs (Aspirin) .... Once daily 6)  Zocor 20 Mg Tabs (Simvastatin) .... Two times a day 7)  Calcium 600 1500 Mg Tabs (Calcium carbonate) .... Once daily 8)  Vitamin D 1000 Unit Tabs (Cholecalciferol) .... Once daily 9)  Celebrex 200 Mg Caps (Celecoxib) .... Once daily 10)  Protonix 40 Mg Tbec (Pantoprazole sodium) .... Once daily   Patient Instructions: 1)  Please schedule a follow-up appointment in 6 months for wellness. 2)  BMP prior to visit, ICD-9: v70.0 3)  Hepatic Panel prior to visit, ICD-9: 4)  Lipid Panel prior to visit, ICD-9: 5)  TSH prior to visit, ICD-9: 6)  CBC w/ Diff prior to visit, ICD-9: 7)  Urine-dip prior to visit, ICD-9: 8)  Increse Soy intake    Prescriptions: PROTONIX 40 MG  TBEC (PANTOPRAZOLE SODIUM) once daily  #30 x 12   Entered and Authorized by:   [name] MD   Signed by:   [name] MD on 02/21/2008   Method used:   Print then Give to Patient   RxID:   [id number]  ]

## 2010-12-31 NOTE — Progress Notes (Signed)
Summary: ear ache  Phone Note Call from Patient   Summary of Call: pt called and left vm that she had a horrible ear ache in her left ear all night and wants to be squeezed in for an appt because she is going out of the country tomorrow. Dr. Posey Rea had a cancelation at 10 am so pt was put in that slot. Called pt back and she is aware states she will be here. Initial call taken by: Windell Norfolk,  December 19, 2008 8:15 AM

## 2010-12-31 NOTE — Assessment & Plan Note (Signed)
Summary: NP PER KRAMER/ORTHOTICS/NM    Vital Signs:  Patient Profile:   75 Years Old Female Pulse rate:   81 / minute BP sitting:   120 / 78  Vitals Entered By: Lillia Pauls CMA (March 20, 2008 11:18 AM)                 Chief Complaint:  NP PER KRAMER WITH R ARCH PAIN X 75 YEAR.  History of Present Illness: 75 y/o F, referred by Dr. Thurston Hole for eval of medial right foot pain, tendinopathy.  pain began 2-3 months ago and has prevented patient from walking--  she is eager to resume walking regimen.  Has been seeing [facility], where she's having iontophoresis, U/S  (just concluded 12 sessions) was given HEP with heel cord stretching, inversion/eversion theraband, admits to poor compliance  activity: pilates, bicycle, elliptical, and resistance training mult times weekly. wants to get back to walking.   x-rays, MRI done at [facility]-- per patient report, no stress fracture, but chronic post tib tendon changes at insertion.    Current Allergies: PRILOSEC ZANTAC  Past Medical History:    GERD    Osteoporosis    Hyperlipidemia    Osteoarthritis        celebrex 200 mg daily, some improvement for foot pain.        see intake sheet for full social/medical hx.      Physical Exam  General:     NAD Msk:     LE: mild global decrease in core/hip strength nl hip ROM, no pain on passive IR/ER nl knee ROM, no signif varus/valgus deformity  bilat feet: insp: forefoot breakdown L>R, hallux valgus L, splay toe and lateral rotational deformity of 4/5th toes. early morton's callus. pt with pes planus L > R nl calcaneal alignment  focal ttp at medial cuneiform, 1st MTP joint, and posterior along course of post tib tendon. no tendinous ttp at ankle level    Impression & Recommendations:  Problem # 1:  TIBIALIS TENDINITIS (ICD-726.72) recommend trial of custom orthotics suspect posterior tibialis tendinopathy 2ary to overpronation with walking exercise.  continue HEP per PT,  and cross training. this should allow return to walking  orthotics fabrication: pt positioned in orthotic stand in subtalar neutral position. molded to red size 8 orthotic blank.  Blue 1/4" EVA base applied, and ground to proper size/alignment.  due to patient's time constraints, unable to do fit analysis-- pt to return for fit analysis on Friday, 01 May at 1:30 pm   Orders: [facility]- [facility] [phone])   Complete Medication List: 1)  Folic Acid 1 Mg Tabs (Folic acid) .... Once daily 2)  Allerx Df30 4-2.5 & 8-2.5 Mg Misc (Chlorpheniramine-methscop) .... Two times a day 3)  Atrovent Hfa 17 Mcg/act Aers (Ipratropium bromide hfa) 4)  Aciphex 20 Mg Tbec (Rabeprazole sodium) .... Once daily 5)  Aspirin 81 Mg Tabs (Aspirin) .... Once daily 6)  Zocor 20 Mg Tabs (Simvastatin) .... Two times a day 7)  Calcium 600 1500 Mg Tabs (Calcium carbonate) .... Once daily 8)  Vitamin D 1000 Unit Tabs (Cholecalciferol) .... Once daily 9)  Celebrex 200 Mg Caps (Celecoxib) .... Once daily 10)  Protonix 40 Mg Tbec (Pantoprazole sodium) .... Once daily     ]

## 2010-12-31 NOTE — Assessment & Plan Note (Signed)
Summary: LAB   Vital Signs:  Patient Profile:   75 Years Old Female Temp:     97 degrees F oral BP sitting:   132 / 80  (left arm)  Vitals Entered By: Tora Perches (August 21, 2008 10:52 AM)                 Chief Complaint:  Multiple medical problems or concerns.  History of Present Illness: The patient presents for a wellness examination     Current Allergies (reviewed today): PRILOSEC ZANTAC  Past Medical History:    Reviewed history from 03/20/2008 and no changes required:       GERD       Osteoporosis       Hyperlipidemia       Osteoarthritis              celebrex 200 mg daily, some improvement for foot pain.              see intake sheet for full social/medical hx.   Family History:    Reviewed history from 02/21/2008 and no changes required:       M CAD  Social History:    Reviewed history from 02/21/2008 and no changes required:       Married       Former Smoker       Regular exercise-yes    Review of Systems  The patient denies anorexia, fever, weight loss, weight gain, vision loss, decreased hearing, hoarseness, chest pain, syncope, dyspnea on exertion, peripheral edema, prolonged cough, headaches, hemoptysis, abdominal pain, melena, hematochezia, severe indigestion/heartburn, hematuria, incontinence, genital sores, muscle weakness, suspicious skin lesions, transient blindness, difficulty walking, depression, unusual weight change, abnormal bleeding, enlarged lymph nodes, angioedema, and breast masses.         PAP 2 mo ago   Physical Exam  General:     Well-developed,well-nourished,in no acute distress; alert,appropriate and cooperative throughout examination Head:     Normocephalic and atraumatic without obvious abnormalities. No apparent alopecia or balding. Eyes:     No corneal or conjunctival inflammation noted. EOMI. Perrla. Funduscopic exam benign, without hemorrhages, exudates or papilledema. Vision grossly normal. Ears:  External ear exam shows no significant lesions or deformities.  Otoscopic examination reveals clear canals, tympanic membranes are intact bilaterally without bulging, retraction, inflammation or discharge. Hearing is grossly normal bilaterally. Nose:     External nasal examination shows no deformity or inflammation. Nasal mucosa are pink and moist without lesions or exudates. Mouth:     Oral mucosa and oropharynx without lesions or exudates.  Teeth in good repair. Neck:     No deformities, masses, or tenderness noted. Lungs:     Normal respiratory effort, chest expands symmetrically. Lungs are clear to auscultation, no crackles or wheezes. Heart:     Normal rate and regular rhythm. S1 and S2 normal without gallop, murmur, click, rub or other extra sounds. Abdomen:     Bowel sounds positive,abdomen soft and non-tender without masses, organomegaly or hernias noted. Msk:     No deformity or scoliosis noted of thoracic or lumbar spine.   Extremities:     No clubbing, cyanosis, edema, or deformity noted with normal full range of motion of all joints.   Neurologic:     No cranial nerve deficits noted. Station and gait are normal. Plantar reflexes are down-going bilaterally. DTRs are symmetrical throughout. Sensory, motor and coordinative functions appear intact. Skin:     Intact without suspicious lesions or rashes Cervical  Nodes:     No lymphadenopathy noted Inguinal Nodes:     No significant adenopathy Psych:     Cognition and judgment appear intact. Alert and cooperative with normal attention span and concentration. No apparent delusions, illusions, hallucinations    Impression & Recommendations:  Problem # 1:  WELL ADULT EXAM (ICD-V70.0) Assessment: Unchanged Take less asa due to bruising  OK to d/c folic acid Flu shot Zpac for travel Get labs Had an EKG in 2009 and a CXR in 2008  Problem # 2:  SWEATING (ICD-780.8) Assessment: Comment Only Resolved  Problem # 3:   OSTEOARTHRITIS (ICD-715.90) Assessment: Improved  Her updated medication list for this problem includes:    Aspirin 81 Mg Tabs (Aspirin) ..... Once daily    Celebrex 200 Mg Caps (Celecoxib) ..... Once daily   Problem # 4:  DYSLIPIDEMIA (ICD-272.4) Assessment: Improved  Her updated medication list for this problem includes:    Zocor 20 Mg Tabs (Simvastatin) .[name]..[name] Two times a day   Complete Medication List: 1)  Folic Acid 1 Mg Tabs (Folic acid) .... Once daily 2)  Allerx Df30 4-2.5 & 8-2.5 Mg Misc (Chlorpheniramine-methscop) .... Two times a day 3)  Atrovent Hfa 17 Mcg/act Aers (Ipratropium bromide hfa) 4)  Aciphex 20 Mg Tbec (Rabeprazole sodium) .... Once daily 5)  Aspirin 81 Mg Tabs (Aspirin) .... Once daily 6)  Zocor 20 Mg Tabs (Simvastatin) .... Two times a day 7)  Calcium 600 1500 Mg Tabs (Calcium carbonate) .... Once daily 8)  Vitamin D 1000 Unit Tabs (Cholecalciferol) .... Once daily 9)  Celebrex 200 Mg Caps (Celecoxib) .... Once daily  Other Orders: Future Orders: Flu Vaccine 53yrs + (81191) ... 08/23/2008 Administration Flu vaccine ([id number]) ... 08/23/2008   Patient Instructions: 1)  Please schedule a follow-up appointment in 6 months. 2)  Labs tomorrow: 3)  v70.0  272.0 995.2 4)  BMP prior to visit, ICD-9: 5)  Hepatic Panel prior to visit, ICD-9: 6)  Lipid Panel prior to visit, ICD-9: 7)  TSH prior to visit, ICD-9: 8)  CBC w/ Diff prior to visit, ICD-9: 9)  Urine-dip prior to visit, ICD-9:   Prescriptions: ZITHROMAX Z-PAK 250 MG  TABS (AZITHROMYCIN) Use as directed  #1 x 0   Entered and Authorized by:   [name] MD   Signed by:   [name] MD on 08/21/2008   Method used:   Electronically to        [facility]* (retail)       [address]       [place], [place]  [address]       Ph: [phone]       Fax: [phone]   RxID:   [id number]  Flu Vaccine Consent Questions     Do you have a history of severe allergic  reactions to this vaccine? no    Any prior history of allergic reactions to egg and/or gelatin? no    Do you have a sensitivity to the preservative Thimersol? no    Do you have a past history of Guillan-Barre Syndrome? no    Do you currently have an acute febrile illness? no    Have you ever had a severe reaction to latex? no    Vaccine information given and explained to patient? yes    Are you currently pregnant? no    Lot Number:[id number]   Site Given  Left Deltoid [id number]  ]     .medflu

## 2010-12-31 NOTE — Progress Notes (Signed)
Summary: pt wants echo results- [facility]   Phone Note Call from Patient   Caller: Patient [phone] Reason for Call: Talk to Nurse, Lab or Test Results Summary of Call: pt calling for echo results Initial call taken by: Glynda Jaeger,  December 02, 2010 9:59 AM  Follow-up for Phone Call        [facility] for echo results. Whitney Maeola Sarah RN  December 02, 2010 10:06 AM  patient aware of test results. Whitney Maeola Sarah RN  December 02, 2010 10:59 AM  Follow-up by: Whitney Maeola Sarah RN,  December 02, 2010 10:06 AM

## 2010-12-31 NOTE — Progress Notes (Signed)
  Phone Note Call from Patient Call back at [phone]   Caller: Patient Summary of Call: Patient called stating she is travelling to [place] and will need an rx for zpak sent in for her to have. She request this to be sent to [facility] Initial call taken by: Rock Nephew CMA,  May 05, 2010 10:56 AM  Follow-up for Phone Call        ok Follow-up by: [name] MD,  May 05, 2010 1:03 PM  Additional Follow-up for Phone Call Additional follow up Details #1::        Patient notified.Marland KitchenMarland KitchenAlvy Beal Archie CMA  May 05, 2010 1:42 PM     New/Updated Medications: ZITHROMAX Z-PAK 250 MG TABS (AZITHROMYCIN) as dirrected Prescriptions: ZITHROMAX Z-PAK 250 MG TABS (AZITHROMYCIN) as dirrected  #1 x 0   Entered and Authorized by:   [name] MD   Signed by:   Rock Nephew CMA on 05/05/2010   Method used:   Electronically to        [facility]* (retail)       [address]       [place], [place]  [id number]       Ph: [phone]       Fax: [phone]   RxID:   [id number]

## 2010-12-31 NOTE — Progress Notes (Signed)
Summary: triage   Phone Note Call from Patient Call back at [phone]   Caller: Patient Call For: Alexey Rhoads Reason for Call: Talk to Nurse Summary of Call: Patient states that everytime she eats she goes to the bathroom, feels bloated, does not have a lot of appetite, states that she's not feeling right. Altamese Deguire would like to be seen before first available appt 9-20 Initial call taken by: Tawni Levy,  July 15, 2009 9:25 AM  Follow-up for Phone Call        Given appt. with Dr.Aser Nylund for 07/21/09. Follow-up by: Teryl Lucy RN,  July 15, 2009 10:33 AM

## 2010-12-31 NOTE — Letter (Signed)
Summary: [facility]  [facility]   Imported By: Sherian Rein 11/05/2009 11:57:40  _____________________________________________________________________  External Attachment:    Type:   Image     Comment:   External Document

## 2010-12-31 NOTE — Assessment & Plan Note (Signed)
Summary: ear pain/triage a/ cd   Vital Signs:  Patient Profile:   75 Years Old Female Temp:     97.5 degrees F oral Pulse rate:   88 / minute BP sitting:   132 / 82  (left arm)  Vitals Entered By: Tora Perches (December 19, 2008 10:19 AM)                 PCP:  Sonda Primes, MD  Chief Complaint:  multiple medical problems or concerns    pt refused to weigh/vg.  History of Present Illness: Pain in L ear since last night severe at times and lancinating off and on; better now    Prior Medications Reviewed Using: Patient Recall  Current Allergies (reviewed today): PRILOSEC  Past Medical History:    Reviewed history from 03/20/2008 and no changes required:       GERD       Osteoporosis       Hyperlipidemia       Osteoarthritis       celebrex 200 mg daily, some improvement for foot pain.       see intake sheet for full social/medical hx.       Allergic rhinitis     Review of Systems  The patient denies fever and prolonged cough.     Physical Exam  General:     Well-developed,well-nourished,in no acute distress; alert,appropriate and cooperative throughout examination Head:     Normocephalic and atraumatic without obvious abnormalities. No apparent alopecia or balding. Eyes:     No corneal or conjunctival inflammation noted. EOMI. Perrla. Funduscopic exam benign, without hemorrhages, exudates or papilledema. Vision grossly normal. Ears:     External ear exam shows no significant lesions or deformities.  Otoscopic examination reveals clear canals, tympanic membranes are intact bilaterally without bulging, retraction, inflammation or discharge. Hearing is grossly normal bilaterally. Nose:     External nasal examination shows no deformity or inflammation. Nasal mucosa are pink and moist without lesions or exudates. Mouth:     Oral mucosa and oropharynx without lesions or exudates.  Teeth in good repair. Lungs:     Normal respiratory effort, chest expands  symmetrically. Lungs are clear to auscultation, no crackles or wheezes. Neurologic:     No cranial nerve deficits noted. Station and gait are normal. Plantar reflexes are down-going bilaterally. DTRs are symmetrical throughout. Sensory, motor and coordinative functions appear intact.    Impression & Recommendations:  Problem # 1:  EAR PAIN (ICD-388.70) L poss trigem. neuralgia Assessment: New Since she is leaving tomorrow Her updated medication list for this problem includes:    Zithromax Z-pak 250 Mg Tabs (Azithromycin) ..... Use as directed if sinus symptoms Acyclovir x 7 d if rash Advil 2 three times a day pc prn  Her updated medication list for this problem includes:    Zithromax Z-pak 250 Mg Tabs (Azithromycin) ..... Use as directed   Complete Medication List: 1)  Folic Acid 1 Mg Tabs (Folic acid) .... Once daily 2)  Allerx Df30 4-2.5 & 8-2.5 Mg Misc (Chlorpheniramine-methscop) .... Two times a day 3)  Atrovent Hfa 17 Mcg/act Aers (Ipratropium bromide hfa) 4)  Aspirin 81 Mg Tabs (Aspirin) .... Once daily 5)  Zocor 20 Mg Tabs (Simvastatin) .... Two times a day 6)  Calcium 600 1500 Mg Tabs (Calcium carbonate) .... Once daily 7)  Vitamin D 1000 Unit Tabs (Cholecalciferol) .... Once daily 8)  Celebrex 200 Mg Caps (Celecoxib) .... Once daily 9)  Kapidex 60 Mg  Cpdr (Dexlansoprazole) .... Take 1 p.o. q.d. 10)  Rhinocort Aqua 32 Mcg/act Susp (Budesonide (nasal)) .[name].. 1-2 spr each nostr qd 11)  Zithromax Z-pak 250 Mg Tabs (Azithromycin) .... Use as directed 12)  Acyclovir 800 Mg Tabs (Acyclovir) .[name].. 1 by mouth 5 times a day   Patient Instructions: 1)  Call if you are not better in a reasonable ammount of time or if worse.    Prescriptions: ACYCLOVIR 800 MG TABS (ACYCLOVIR) 1 by mouth 5 times a day  #35 x 0   Entered and Authorized by:   [name] MD   Signed by:   [name] MD on 12/19/2008   Method used:   Print then Give to Patient   RxID:    [id number] ZITHROMAX Z-PAK 250 MG  TABS (AZITHROMYCIN) Use as directed  #1 x 0   Entered and Authorized by:   [name] MD   Signed by:   [name] MD on 12/19/2008   Method used:   Print then Give to Patient   RxID:   [id number] RHINOCORT AQUA 32 MCG/ACT SUSP (BUDESONIDE (NASAL)) 1-2 spr each nostr qd  #1 x 6   Entered and Authorized by:   [name] MD   Signed by:   [name] MD on 12/19/2008   Method used:   Print then Give to Patient   RxID:   [id number] ACYCLOVIR 800 MG TABS (ACYCLOVIR) 1 by mouth 5 times a day  #35 x 0   Entered and Authorized by:   [name] MD   Signed by:   [name] MD on 12/19/2008   Method used:   Electronically to        [facility]* (retail)       [address]       [place], [place]  [address]       Ph: [phone]       Fax: [phone]   RxID:   [id number] ZITHROMAX Z-PAK 250 MG  TABS (AZITHROMYCIN) Use as directed  #1 x 0   Entered and Authorized by:   [name] MD   Signed by:   [name] MD on 12/19/2008   Method used:   Electronically to        [facility]* (retail)       [address]       [place], [place]  [id number]       Ph: [phone]       Fax: [phone]   RxID:   [id number] RHINOCORT AQUA 32 MCG/ACT SUSP (BUDESONIDE (NASAL)) 1-2 spr each nostr qd  #1 x 6   Entered and Authorized by:   [name] MD   Signed by:   [name] MD on 12/19/2008   Method used:   Electronically to        [facility]* (retail)       [address]       [place], [place]  [id number]       Ph: [phone]       Fax: [phone]   RxID:   [id number]

## 2010-12-31 NOTE — Progress Notes (Signed)
Summary: LABS Tomorrow  Phone Note Call from Patient Call back at Home Phone [phone]   Summary of Call: Patient is requesting to come in tomorrow am for labs prior to afternoon apt. Initial call taken by: Lamar Sprinkles,  February 20, 2008 11:16 AM  Follow-up for Phone Call        OK Lipids, hepat panel, tsh 272.0 Follow-up by: [name] MD,  February 20, 2008 1:27 PM  Additional Follow-up for Phone Call Additional follow up Details #1::        Lf mess for pt, in IDX Additional Follow-up by: Lamar Sprinkles,  February 20, 2008 3:42 PM

## 2010-12-31 NOTE — Procedures (Signed)
Summary: Colonoscopy   Colonoscopy  Procedure date:  11/10/2006  Findings:      Results: Diverticulosis.       Location:  [facility].    Procedures Next Due Date:    Colonoscopy: 10/2014 Patient Name: [name], [name] MRN:  Procedure Procedures: Colonoscopy CPT: 16109.  Personnel: Endoscopist: Ulyess Mort, MD.  Exam Location: Exam performed in Outpatient Clinic. Outpatient  Patient Consent: Procedure, Alternatives, Risks and Benefits discussed, consent obtained, from patient. Consent was obtained by the RN.  Indications Symptoms: Abdominal pain / bloating.  Surveillance of: Adenomatous Polyp(s).  History  Current Medications: Patient is not currently taking Coumadin.  Pre-Exam Physical: Performed Sep 04, 2002. Entire physical exam was normal. Abdominal exam, Extremity exam, Mental status exam WNL.  Comments: Pt. history reviewed/updated, physical exam performed prior to initiation of sedation? Exam Exam: Extent of exam reached: Cecum, extent intended: Cecum.  The cecum was identified by appendiceal orifice and IC valve. Colon retroflexion performed. Images taken. ASA Classification: II. Tolerance: good.  Monitoring: Pulse and BP monitoring, Oximetry used. Supplemental O2 given.  Colon Prep Prep results: fair, adequate exam.  Sedation Meds: Patient assessed and found to be appropriate for moderate (conscious) sedation. Fentanyl 75 mcg. given IV. Versed 9 mg. given IV.  Findings - DIVERTICULOSIS: Descending Colon to Sigmoid Colon. ICD9: Diverticulosis, Colon: 562.10. Comments: minnimal.  - NOT SEEN ON EXAM: Cecum to Rectum. Polyps, AVM's, Colitis, Tumors, Melanosis, Crohn's, Hemorrhoids,   Assessment Abnormal examination, see findings above.  Diagnoses: 562.10: Diverticulosis, Colon.   Events  Unplanned Interventions: No intervention was required.  Unplanned Events: There were no complications. Plans Medication Plan: Continue current  medications.  Patient Education: Patient given standard instructions for: Diverticulosis. Patient instructed to get routine colonoscopy every 8 years.  Disposition: After procedure patient sent to recovery. After recovery patient sent home.    cc: Linda Hedges. Plotnikov, MD   This report was created from the original endoscopy report, which was reviewed and signed by the above listed endoscopist.

## 2010-12-31 NOTE — Assessment & Plan Note (Signed)
Summary: 6 MOS F/U  CD   Vital Signs:  Patient profile:   75 year old female Height:      62 inches Temp:     97.5 degrees F oral Pulse rate:   75 / minute BP sitting:   120 / 80  (left arm)  Vitals Entered By: Tora Perches (February 19, 2009 10:53 AM) Is Patient Diabetic? No Comments pt refuse to weigh/vg   Primary Care Provider:  Sonda Primes, MD   History of Present Illness: The patient presents for a follow up of hypertension, OA, hyperlipidemia   Problems Prior to Update: 1)  Allergic Rhinitis  (ICD-477.9) 2)  Ear Pain  (ICD-388.70) 3)  Personal Hx Colonic Polyps  (ICD-V12.72) 4)  Irritable Bowel Syndrome  (ICD-564.1) 5)  Gastritis, Chronic  (ICD-535.10) 6)  Duodenitis, Without Hemorrhage  (ICD-535.60) 7)  Well Adult Exam  (ICD-V70.0) 8)  Tibialis Tendinitis  (ICD-726.72) 9)  Osteoarthritis  (ICD-715.90) 10)  Hyperlipidemia  (ICD-272.4) 11)  Sweating  (ICD-780.8) 12)  Dyslipidemia  (ICD-272.4) 13)  Lactose Intolerance  (ICD-271.3) 14)  Osteoporosis  (ICD-733.00) 15)  Gerd  (ICD-530.81)  Medications Prior to Update: 1)  Folic Acid 1 Mg  Tabs (Folic Acid) .... Once Daily 2)  Allerx Df30 4-2.5 & 8-2.5 Mg  Misc (Chlorpheniramine-Methscop) .... Two Times A Day 3)  Atrovent Hfa 17 Mcg/act  Aers (Ipratropium Bromide Hfa) 4)  Aspirin 81 Mg  Tabs (Aspirin) .... Once Daily 5)  Zocor 20 Mg  Tabs (Simvastatin) .... Two Times A Day 6)  Calcium 600 1500 Mg  Tabs (Calcium Carbonate) .... Once Daily 7)  Vitamin D 1000 Unit  Tabs (Cholecalciferol) .... Once Daily 8)  Celebrex 200 Mg  Caps (Celecoxib) .... Once Daily 9)  Kapidex 60 Mg Cpdr (Dexlansoprazole) .... Take 1 P.o. Q.d. 10)  Rhinocort Aqua 32 Mcg/act Susp (Budesonide (Nasal)) .[name].. 1-2 Spr Each Nostr Qd 11)  Acyclovir 800 Mg Tabs (Acyclovir) .[name].. 1 By Mouth 5 Times A Day  Allergies: 1)  Prilosec  Family History:    Reviewed history from 09/03/2008 and no changes required:       M CAD       Family History of  Pancreatic Cancer:Father  Social History:    Reviewed history from 02/21/2008 and no changes required:       Married       Former Smoker       Regular exercise-yes  Review of Systems  The patient denies fever, chest pain, syncope, dyspnea on exertion, and abdominal pain.    Physical Exam  General:  Well-developed,well-nourished,in no acute distress; alert,appropriate and cooperative throughout examination Ears:  External ear exam shows no significant lesions or deformities.  Otoscopic examination reveals clear canals, tympanic membranes are intact bilaterally without bulging, retraction, inflammation or discharge. Hearing is grossly normal bilaterally. Nose:  External nasal examination shows no deformity or inflammation. Nasal mucosa are pink and moist without lesions or exudates. Mouth:  Oral mucosa and oropharynx without lesions or exudates.  Teeth in good repair. Neck:  No deformities, masses, or tenderness noted. Lungs:  Normal respiratory effort, chest expands symmetrically. Lungs are clear to auscultation, no crackles or wheezes. Heart:  Regular rate and rhythm; no murmurs, rubs,  or bruits. Abdomen:  Soft, nontender and nondistended. No masses, hepatosplenomegaly or hernias noted. Normal bowel sounds. Msk:  Symmetrical with no gross deformities. Normal posture. Neurologic:  No cranial nerve deficits noted. Station and gait are normal. Plantar reflexes are down-going bilaterally. DTRs are  symmetrical throughout. Sensory, motor and coordinative functions appear intact. Skin:  Intact without significant lesions or rashes. Psych:  Alert and cooperative. Normal mood and affect.   Impression & Recommendations:  Problem # 1:  EAR PAIN (ICD-388.70) trigeminal neuralgia Assessment Comment Only Better  Problem # 2:  HYPERLIPIDEMIA (ICD-272.4) Assessment: Improved  Her updated medication list for this problem includes:    Zocor 20 Mg Tabs (Simvastatin) .[name]..[name] Two times a day   Problem # 3:  OSTEOARTHRITIS (ICD-715.90) Assessment: Unchanged  Her updated medication list for this problem includes:    Aspirin 81 Mg Tabs (Aspirin) ..... Once daily    Celebrex 200 Mg Caps (Celecoxib) ..... Once daily  Problem # 4:  GERD (ICD-530.81) Assessment: Unchanged  Her updated medication list for this problem includes:    Kapidex 60 Mg Cpdr (Dexlansoprazole) .[name]... Take 1 p.o. q.d.  Complete Medication List: 1)  Folic Acid 1 Mg Tabs (Folic acid) .... Once daily 2)  Allerx Df30 4-2.5 & 8-2.5 Mg Misc (Chlorpheniramine-methscop) .... Two times a day 3)  Atrovent Hfa 17 Mcg/act Aers (Ipratropium bromide hfa) 4)  Aspirin 81 Mg Tabs (Aspirin) .... Once daily 5)  Zocor 20 Mg Tabs (Simvastatin) .... Two times a day 6)  Calcium 600 1500 Mg Tabs (Calcium carbonate) .... Once daily 7)  Vitamin D 1000 Unit Tabs (Cholecalciferol) .... Once daily 8)  Celebrex 200 Mg Caps (Celecoxib) .... Once daily 9)  Kapidex 60 Mg Cpdr (Dexlansoprazole) .... Take 1 p.o. q.d. 10)  Rhinocort Aqua 32 Mcg/act Susp (Budesonide (nasal)) .[name].. 1-2 spr each nostr qd  Other Orders: T-2 View CXR, Same Day (71020.5TC)  Patient Instructions: 1)  Please schedule a follow-up appointment in 6 months well w/labs. Prescriptions: RHINOCORT AQUA 32 MCG/ACT SUSP (BUDESONIDE (NASAL)) 1-2 spr each nostr qd  #90 x 3   Entered and Authorized by:   [name] MD   Signed by:   [name] MD on 02/19/2009   Method used:   Print then Give to Patient   RxID:   [id number] KAPIDEX 60 MG CPDR (DEXLANSOPRAZOLE) Take 1 p.o. q.d.  #90 x 3   Entered and Authorized by:   [name] MD   Signed by:   [name] MD on 02/19/2009   Method used:   Print then Give to Patient   RxID:   [id number] ZOCOR 20 MG  TABS (SIMVASTATIN) two times a day  #90 x 3   Entered and Authorized by:   [name] MD   Signed by:   [name] MD on 02/19/2009   Method used:   Print then  Give to Patient   RxID:   [id number] ATROVENT HFA 17 MCG/ACT  AERS (IPRATROPIUM BROMIDE HFA)   #3 x 3   Entered and Authorized by:   [name] MD   Signed by:   [name] MD on 02/19/2009   Method used:   Print then Give to Patient   RxID:   [id number] QQVZDG DF30 4-2.5 & 8-2.5 MG  MISC (CHLORPHENIRAMINE-METHSCOP) two times a day  #180 x 3   Entered and Authorized by:   [name] MD   Signed by:   [name] MD on 02/19/2009   Method used:   Print then Give to Patient   RxID:   [id number] FOLIC ACID 1 MG  TABS (FOLIC ACID) once daily  #90 x 3   Entered and Authorized by:   Georgina Quint  Dalila Arca MD   Signed by:   [name] MD on 02/19/2009   Method used:   Print then Give to Patient   RxID:   [id number]

## 2010-12-31 NOTE — Assessment & Plan Note (Signed)
Summary: f/u per pt/#/cd   Vital Signs:  Patient profile:   75 year old female Height:      62 inches O2 Sat:      95 % on Room air Temp:     97.8 degrees F oral Pulse rate:   73 / minute BP sitting:   120 / 70  (left arm) Cuff size:   regular  Vitals Entered By: Lucious Groves (Apr 14, 2010 9:37 AM)  O2 Flow:  Room air CC: F/U ov and discuss synthroid./kb Is Patient Diabetic? No   Primary Care Provider:  Sula Soda, MD   CC:  F/U ov and discuss synthroid./kb.  History of Present Illness: F/u abn thyroid tests, dyslipidemia and GERD. Feeling much better afteershe stopped thyroid med  Current Medications (verified): 1)  Folic Acid 1 Mg  Tabs (Folic Acid) .... Once Daily 2)  Atrovent Hfa 17 Mcg/act  Aers (Ipratropium Bromide Hfa) 3)  Aspirin 81 Mg  Tabs (Aspirin) .... Once Daily 4)  Simvastatin 20 Mg Tabs (Simvastatin) .... Two Tablets Daily 5)  Calcium 600 1500 Mg  Tabs (Calcium Carbonate) .... Once Daily 6)  Vitamin D 1000 Unit  Tabs (Cholecalciferol) .... Once Daily 7)  Rhinocort Aqua 32 Mcg/act Susp (Budesonide (Nasal)) .[name].. 1-2 Spr Each Nostr Qd 8)  Aciphex 20 Mg Tbec (Rabeprazole Sodium) .... One Tablet By Mouth Once Daily 9)  Zyrtec Allergy 10 Mg Caps (Cetirizine Hcl) .... One Tablet By Mouth Once Daily 10)  Pennsaid 1.5 % Soln (Diclofenac Sodium) .... 3-5 Gtt To Skin Three Times Daily 11)  Synthroid 25 Mcg Tabs (Levothyroxine Sodium) .[name].. 1 By Mouth Once Daily For Thyroid 12)  Voltaren 1 % Gel (Diclofenac Sodium) .... As Needed For 3 Weeks  Allergies (verified): 1)  Prilosec  Past History:  Past Medical History: GERD Osteoporosis Hyperlipidemia Osteoarthritis celebrex 200 mg daily, some improvement for foot pain. see intake sheet for full social/medical hx. Allergic rhinitis L Baker cyst Dr Lequita Halt Torn menisci B kneees Falsely elev TSH ( no Hypothyroidism) 2011  Review of Systems  The patient denies fever, chest pain, dyspnea on exertion, abdominal  pain, and depression.    Physical Exam  General:  Well developed, well nourished, no acute distress. Nose:  No deformity, discharge,  or lesions. Mouth:  No deformity or lesions, dentition normal. Lungs:  Normal respiratory effort, chest expands symmetrically. Lungs are clear to auscultation, no crackles or wheezes. Heart:  Normal rate and regular rhythm. S1 and S2 normal without gallop, murmur, click, rub or other extra sounds. Abdomen:  Bowel sounds positive,abdomen soft and non-tender without masses, organomegaly or hernias noted. Msk:  No deformity or scoliosis noted of thoracic or lumbar spine.   Extremities:  No clubbing, cyanosis, edema, or deformity noted with normal full range of motion of all joints.   Neurologic:  No cranial nerve deficits noted. Station and gait are normal. Plantar reflexes are down-going bilaterally. DTRs are symmetrical throughout. Sensory, motor and coordinative functions appear intact. Skin:  Intact without suspicious lesions or rashes Psych:  Cognition and judgment appear intact. Alert and cooperative with normal attention span and concentration. No apparent delusions, illusions, hallucinations   Impression & Recommendations:  Problem # 1:  HYPOTHYROIDISM (ICD-244.9) - false pos test Assessment Comment Only The labs were reviewed with the patient.  The following medications were removed from the medication list:    Synthroid 25 Mcg Tabs (Levothyroxine sodium) .[name]... 1 by mouth once daily for thyroid  Problem # 2:  DYSLIPIDEMIA (ICD-272.4) Assessment: Comment Only  Her updated medication list for this problem includes:    Simvastatin 20 Mg Tabs (Simvastatin) .[name]..[name] Two tablets daily  Labs Reviewed: SGOT: 26 (02/26/2010)   SGPT: 21 (02/26/2010)   HDL:56.80 (02/26/2010), 47.40 (02/12/2009)  LDL:85 (02/26/2010), 109 (84/69/6295)  Chol:158 (02/26/2010), 174 (02/12/2009)  Trig:82.0 (02/26/2010), 88.0 (02/12/2009)  Problem # 3:  GERD  (ICD-530.81) Assessment: Unchanged  Her updated medication list for this problem includes:    Aciphex 20 Mg Tbec (Rabeprazole sodium) ..... One tablet by mouth once daily  Complete Medication List: 1)  Folic Acid 1 Mg Tabs (Folic acid) .... Once daily 2)  Atrovent Hfa 17 Mcg/act Aers (Ipratropium bromide hfa) 3)  Aspirin 81 Mg Tabs (Aspirin) .... Once daily 4)  Simvastatin 20 Mg Tabs (Simvastatin) .... Two tablets daily 5)  Calcium 600 1500 Mg Tabs (Calcium carbonate) .... Once daily 6)  Vitamin D 1000 Unit Tabs (Cholecalciferol) .... Once daily 7)  Rhinocort Aqua 32 Mcg/act Susp (Budesonide (nasal)) .[name].. 1-2 spr each nostr qd 8)  Aciphex 20 Mg Tbec (Rabeprazole sodium) .... One tablet by mouth once daily 9)  Zyrtec Allergy 10 Mg Caps (Cetirizine hcl) .... One tablet by mouth once daily 10)  Pennsaid 1.5 % Soln (Diclofenac sodium) .... 3-5 gtt to skin three times daily 11)  Voltaren 1 % Gel (Diclofenac sodium) .... As needed for 3 weeks 12)  Lorazepam 0.5 Mg Tabs (Lorazepam) .[name].. 1 by mouth two times a day as needed anxiety  Other Orders: TD Toxoids IM 7 YR + (28413) Admin 1st Vaccine (24401) Admin 1st Vaccine [facility]) [phone])  Patient Instructions: 1)  Please schedule a follow-up appointment in 6 months. 2)  TSH prior to visit, ICD-9: 3)  FT3, FT4  995.20 4)  Hepatic Panel prior to visit, ICD-9: 5)  Lipid Panel prior to visit, ICD-9: 272.20 Prescriptions: LORAZEPAM 0.5 MG TABS (LORAZEPAM) 1 by mouth two times a day as needed anxiety  #180 x 1   Entered and Authorized by:   [name] MD   Signed by:   [name] MD on 04/14/2010   Method used:   Print then Give to Patient   RxID:   [id number] SIMVASTATIN 20 MG TABS (SIMVASTATIN) two tablets daily  #180 x 3   Entered and Authorized by:   [name] MD   Signed by:   [name] MD on 04/14/2010   Method used:   Electronically to        [facility]* (retail)       [address]       [place], [place]  [id number]       Ph: [phone]       Fax: [phone]   RxID:   [id number]    Tetanus/Td Vaccine    Vaccine Type: Td    Site: right deltoid    Mfr: Sanofi Pasteur    Dose: 0.5 ml    Route: IM    Given by: Lucious Groves    Exp. Date: 12/12/2011    Lot #: [id number]    VIS given: 10/17/07 version given Apr 14, 2010.

## 2010-12-31 NOTE — Progress Notes (Signed)
Summary: LABS  Phone Note Call from Patient   Summary of Call: Very tired on Synthroid Initial call taken by: [name] MD,  March 18, 2010 5:49 PM  Follow-up for Phone Call        Stop Synthroid (she is aware). Check TSH, FT3, FT4 in mid May 244.8 Follow-up by: [name] MD,  March 18, 2010 5:49 PM  Additional Follow-up for Phone Call Additional follow up Details #1::        Please put labs in [place], [place] Additional Follow-up by: Lamar Sprinkles, CMA,  March 18, 2010 5:52 PM    Additional Follow-up for Phone Call Additional follow up Details #2::    orders in [facility]. Follow-up by: Verdell Face,  March 19, 2010 8:11 AM

## 2010-12-31 NOTE — Assessment & Plan Note (Signed)
Summary: 6 month follow-lb   Vital Signs:  Patient profile:   75 year old female Height:      62 inches Temp:     98.4 degrees F oral Pulse rate:   85 / minute Pulse rhythm:   regular BP sitting:   102 / 60  (left arm) Cuff size:   regular  Vitals Entered By: Lanier Prude, CMA(AAMA) (September 02, 2010 9:23 AM) CC: 6 mo f/u Is Patient Diabetic? No   Primary Care Provider:  Sula Soda, MD   CC:  6 mo f/u.  History of Present Illness: C/o urinary urgency at night only Dr Eda Paschal did [place] and UA - given Gala Murdoch She lost 10 lbs on diet F/u MVP  Current Medications (verified): 1)  Atrovent Hfa 17 Mcg/act  Aers (Ipratropium Bromide Hfa) 2)  Aspirin 81 Mg  Tabs (Aspirin) .... Once Daily 3)  Simvastatin 20 Mg Tabs (Simvastatin) .... Two Tablets Daily 4)  Calcium 600 1500 Mg  Tabs (Calcium Carbonate) .... Once Daily 5)  Vitamin D 1000 Unit  Tabs (Cholecalciferol) .... Once Daily 6)  Rhinocort Aqua 32 Mcg/act Susp (Budesonide (Nasal)) .[name].. 1-2 Spr Each Nostr As Needed 7)  Aciphex 20 Mg Tbec (Rabeprazole Sodium) .... One Tablet By Mouth Once Daily 8)  Zyrtec Allergy 10 Mg Caps (Cetirizine Hcl) .... One Tablet By Mouth Once Daily 9)  Pennsaid 1.5 % Soln (Diclofenac Sodium) .... 3-5 Gtt To Skin Three Times Daily As Needed 10)  Voltaren 1 % Gel (Diclofenac Sodium) .... As Needed 11)  Lorazepam 0.5 Mg Tabs (Lorazepam) .[name].. 1 By Mouth Two Times A Day As Needed Anxiety  Allergies (verified): 1)  Prilosec  Past History:  Past Surgical History: Last updated: 09/04/2008 Unremarkable  Family History: Last updated: 07/21/2009 M CAD Family History of Pancreatic Cancer:Father No FH of Colon Cancer:  Social History: Last updated: 07/21/2009 Married Former Smoker Regular exercise-yes Occupation: Retired   Past Medical History: 1. GERD 2. Osteoporosis 3. Hyperlipidemia 4. Osteoarthritis 5. celebrex 200 mg daily, some improvement for foot pain. 6. see intake sheet for full  social/medical hx. 7. Allergic rhinitis 8. L Baker cyst Dr Lequita Halt 9. Torn menisci B kneees 10. Falsely elev TSH ( no Hypothyroidism) 2011 11. Mitral Valve Prolapse 12. Cataract L>R Dr Dione Booze 13 Overactive bladder  Review of Systems  The patient denies fever, chest pain, and dyspnea on exertion.         Lost wt on diet - 10 lbs  Physical Exam  General:  Well developed, well nourished, no acute distress. Ears:  External ear exam shows no significant lesions or deformities.  Otoscopic examination reveals clear canals, tympanic membranes are intact bilaterally without bulging, retraction, inflammation or discharge. Hearing is grossly normal bilaterally. Nose:  No deformity, discharge,  or lesions. Mouth:  No deformity or lesions, dentition normal. Lungs:  Normal respiratory effort, chest expands symmetrically. Lungs are clear to auscultation, no crackles or wheezes. Heart:  Normal rate and regular rhythm. S1 and S2 normal without gallop, murmur, click, rub or other extra sounds. Abdomen:  Bowel sounds positive,abdomen soft and non-tender without masses, organomegaly or hernias noted. Msk:  No deformity or scoliosis noted of thoracic or lumbar spine.   Extremities:  No clubbing, cyanosis, edema, or deformity noted with normal full range of motion of all joints.   Neurologic:  No cranial nerve deficits noted. Station and gait are normal. Plantar reflexes are down-going bilaterally. DTRs are symmetrical throughout. Sensory, motor and coordinative functions appear  intact. Skin:  Intact without suspicious lesions or rashes Psych:  Cognition and judgment appear intact. Alert and cooperative with normal attention span and concentration. No apparent delusions, illusions, hallucinations   Impression & Recommendations:  Problem # 1:  HYPOTHYROIDISM (ICD-244.9) not on Rx Assessment Unchanged Euthyroid and intol of Thyroxin - will monitor  Problem # 2:  OVERACTIVE BLADDER  (ICD-596.51) Assessment: New She found 1 advil at hs was curing her symptoms - OK to cont  Problem # 3:  HYPERLIPIDEMIA (ICD-272.4) Assessment: Improved  Her updated medication list for this problem includes:    Simvastatin 20 Mg Tabs (Simvastatin) .[name]..[name] Two tablets daily  Problem # 4:  ALLERGIC RHINITIS (ICD-477.9) Assessment: Unchanged  Her updated medication list for this problem includes:    Rhinocort Aqua 32 Mcg/act Susp (Budesonide (nasal)) .[name]... 1-2 spr each nostr as needed    Zyrtec Allergy 10 Mg Caps (Cetirizine hcl) ..... One tablet by mouth once daily  Problem # 5:  MITRAL VALVE DISORDERS (ICD-424.0) Assessment: Unchanged No need for antibiotic prophylaxis Her updated medication list for this problem includes:    Aspirin 81 Mg Tabs (Aspirin) ..... Once daily  Complete Medication List: 1)  Atrovent Hfa 17 Mcg/act Aers (Ipratropium bromide hfa) 2)  Aspirin 81 Mg Tabs (Aspirin) .... Once daily 3)  Simvastatin 20 Mg Tabs (Simvastatin) .... Two tablets daily 4)  Calcium 600 1500 Mg Tabs (Calcium carbonate) .... Once daily 5)  Vitamin D 1000 Unit Tabs (Cholecalciferol) .... Once daily 6)  Rhinocort Aqua 32 Mcg/act Susp (Budesonide (nasal)) .[name].. 1-2 spr each nostr as needed 7)  Aciphex 20 Mg Tbec (Rabeprazole sodium) .... One tablet by mouth once daily 8)  Zyrtec Allergy 10 Mg Caps (Cetirizine hcl) .... One tablet by mouth once daily 9)  Pennsaid 1.5 % Soln (Diclofenac sodium) .... 3-5 gtt to skin three times daily as needed 10)  Voltaren 1 % Gel (Diclofenac sodium) .... As needed 11)  Lorazepam 0.5 Mg Tabs (Lorazepam) .[name].. 1 by mouth two times a day as needed anxiety  Other Orders: Flu Vaccine 72yrs + MEDICARE PATIENTS (F6213) Administration Flu vaccine - MCR (Y8657)  Patient Instructions: 1)  Please schedule a follow-up appointment in 6 months well w/labs. Prescriptions: LORAZEPAM 0.5 MG TABS (LORAZEPAM) 1 by mouth two times a day as needed anxiety  #180 x 1   Entered  and Authorized by:   [name] MD   Signed by:   [name] MD on 09/02/2010   Method used:   Print then Give to Patient   RxID:   [id number] RHINOCORT AQUA 32 MCG/ACT SUSP (BUDESONIDE (NASAL)) 1-2 spr each nostr as needed  #1 x 6   Entered and Authorized by:   [name] MD   Signed by:   [name] MD on 09/02/2010   Method used:   Print then Give to Patient   RxID:   [id number]    Flu Vaccine Consent Questions     Do you have a history of severe allergic reactions to this vaccine? no    Any prior history of allergic reactions to egg and/or gelatin? no    Do you have a sensitivity to the preservative Thimersol? no    Do you have a past history of Guillan-Barre Syndrome? no    Do you currently have an acute febrile illness? no    Have you ever had a severe reaction to latex? no    Vaccine information given and explained to patient?  yes    Are you currently pregnant? no    Lot Number:[id number]   Exp Date:05/29/2011   Site Given  Left Deltoid IM Lanier Prude, [facility])  September 02, 2010 9:27 AM

## 2010-12-31 NOTE — Progress Notes (Signed)
Summary: soon appt than 7/27   Phone Note Call from Patient Call back at Home Phone [phone]   Caller: Patient Reason for Call: Talk to Nurse Summary of Call: pt on the bumplist for dr. Gala Romney, pt offer to come in the office on 6/23 pt refused. next avail will be 7/27. pt very upset" states she shouldn't have to wait that long to see dr. Gala Romney . would like a call back Initial call taken by: Lorne Skeens,  May 06, 2010 3:48 PM  Follow-up for Phone Call        appt sch for 7/5 Follow-up by: Meredith Staggers, RN,  May 07, 2010 11:19 AM

## 2010-12-31 NOTE — Assessment & Plan Note (Signed)
Summary: /Dr. Doreatha Martin pt. before/cl    History of Present Illness Visit Type: new patient Primary GI MD: Yancey Flemings MD Primary Provider: Sonda Primes, MD Chief Complaint: acid reflux History of Present Illness:   75 year old female, former patient of Dr. Victorino Dike, who presents today regarding reflux disease. She has been followed by Dr. Corinda Gubler for gastroesophageal reflux disease as well as irritable bowel syndrome. She has undergone prior colonoscopy and upper endoscopy. Her last colonoscopy, for history of colon polyps was performed in December of 2007. Diverticulosis noted. Followup in 8 years recommended. Her last upper endoscopy was performed in October of 2003. Mild duodenitis and chronic gastritis noted. A 3 cm hiatal hernia. Otherwise normal. Testing for Helicobacter pylori was negative. She reports to me that she has been doing good on b.i.d. AcipHex. However, due to rising cost she switched to over-the-counter Prilosec. Once or twice daily this was ineffective. She has also been using twice daily H2 receptor antagonists without success. When resuming AcipHex b.i.d., her symptoms were controlled. She will use antacids on occasion. She has had some water brash. No dysphagia or significant nocturnal symptoms. No bowel pain. No active problems with that irritable bowel. Recent complete physical examination with Dr. Posey Rea was negative. Screening laboratories and routine laboratories unremarkable.             Prior Medications Reviewed Using: List Brought by Patient  Updated Prior Medication List: FOLIC ACID 1 MG  TABS (FOLIC ACID) once daily ALLERX DF30 4-2.5 & 8-2.5 MG  MISC (CHLORPHENIRAMINE-METHSCOP) two times a day ATROVENT HFA 17 MCG/ACT  AERS (IPRATROPIUM BROMIDE HFA)  ACIPHEX 20 MG  TBEC (RABEPRAZOLE SODIUM) once daily ASPIRIN 81 MG  TABS (ASPIRIN) once daily ZOCOR 20 MG  TABS (SIMVASTATIN) two times a day CALCIUM 600 1500 MG  TABS (CALCIUM CARBONATE) once daily VITAMIN  D 1000 UNIT  TABS (CHOLECALCIFEROL) once daily CELEBREX 200 MG  CAPS (CELECOXIB) once daily ZANTAC 150 MG TABS (RANITIDINE HCL) once daily  Current Allergies (reviewed today): PRILOSEC  Past Medical History:    Reviewed history from 03/20/2008 and no changes required:       GERD       Osteoporosis       Hyperlipidemia       Osteoarthritis              celebrex 200 mg daily, some improvement for foot pain.              see intake sheet for full social/medical hx.  Past Surgical History:    Unremarkable   Family History:    Reviewed history from 09/03/2008 and no changes required:       M CAD       Family History of Pancreatic Cancer:Father  Social History:    Reviewed history from 02/21/2008 and no changes required:       Married       Former Smoker       Regular exercise-yes     Vital Signs:  Patient Profile:   75 Years Old Female Pulse rate:   100 / minute Pulse rhythm:   regular BP sitting:   112 / 68  (left arm)  Vitals Entered By: June McMurray CMA (September 04, 2008 11:16 AM)                  Physical Exam  General:     Well developed, well nourished, no acute distress. Head:     Normocephalic and atraumatic.  Eyes:     PERRLA, no icterus. Nose:     No deformity, discharge,  or lesions. Mouth:     No deformity or lesions, dentition normal. Neck:     Supple; no masses or thyromegaly. Lungs:     Clear throughout to auscultation. Heart:     Regular rate and rhythm; no murmurs, rubs,  or bruits. Abdomen:     Soft, nontender and nondistended. No masses, hepatosplenomegaly or hernias noted. Normal bowel sounds. Msk:     Symmetrical with no gross deformities. Normal posture. Pulses:     Normal pulses noted. Extremities:     No clubbing, cyanosis, edema or deformities noted. Neurologic:     Alert and  oriented x4;  grossly normal neurologically. Skin:     Intact without significant lesions or rashes. Psych:     Alert and cooperative. Normal  mood and affect.    Impression & Recommendations:  Problem # 1:  GERD (ICD-530.81) chronic gastroesophageal reflux disease. Recent exacerbation after changing proton pump inhibitor therapy. No worrisome or alarm features. Normal esophagus on prior endoscopy.  Plan: #1. Trial of Kapidex 60 mg daily. Multiple samples have been provided to cover at least one month #2. List of all known proton pump inhibitors as well as generic alternatives. She has been asked to check with her insurance company regarding variable costs as we hope to find the most cost effective proton pump inhibitor that controls her symptoms. #3. Discussion and accompanying literature on reflux disease as well as reflux precautions and reflux diet all provided  Problem # 2:  IRRITABLE BOWEL SYNDROME (ICD-564.1) stable  Problem # 3:  PERSONAL HX COLONIC POLYPS (ICD-V12.72) due for routine followup in 2015   Patient Instructions: 1)  Kapidex samples given to patient with instructions go call office if she needs Rx.  2)  GI reflux brochures given.    ]

## 2010-12-31 NOTE — Progress Notes (Signed)
Summary: updates  Phone Note Call from Patient   Caller: Patient Summary of Call: Patient notified of xray results and states that she would like to know when her last conoloscopy was ans when she will be due again? Per EMR flowsheet, last screening was 11/09/2006 and due again 10/2014. Please confirm Thanks Initial call taken by: Rock Nephew CMA,  March 11, 2010 2:10 PM  Follow-up for Phone Call        Colon due 2015 Pneumovax due 2012 Follow-up by: [name] MD,  March 12, 2010 8:26 AM  Additional Follow-up for Phone Call Additional follow up Details #1::        Pt informed  Additional Follow-up by: Lamar Sprinkles, CMA,  March 12, 2010 10:43 AM

## 2010-12-31 NOTE — Progress Notes (Signed)
Summary: cartact surgery on 10/10   Phone Note Call from Patient Call back at Home Phone [phone]   Caller: Patient Reason for Call: Talk to Nurse Summary of Call: pt having cartact surgery on 10/10. should pt take 4 antibiotic  prior to surgery.  Initial call taken by: Lorne Skeens,  September 02, 2010 12:41 PM  Follow-up for Phone Call        per Dr Gala Romney does not need antibiotics, pt is aware Meredith Staggers, RN  September 02, 2010 6:43 PM

## 2010-12-31 NOTE — Consult Note (Signed)
Summary: [facility]   Imported By: Maryln Gottron 01/17/2008 13:18:12  _____________________________________________________________________  External Attachment:    Type:   Image     Comment:   External Document

## 2010-12-31 NOTE — Assessment & Plan Note (Signed)
Summary: Gas / GERD / Change in bowels    History of Present Illness Visit Type: follow up  Primary GI MD: Yancey Flemings MD Primary Provider: Sula Soda, MD  Requesting Provider: self Chief Complaint: Pt states diarrhea and loss of appetite. History of Present Illness:   [name] is a 75 year old with hyperlipidemia, osteoporosis, GERD, and irritable bowel. She presents today with complaints of increased intestinal gas, altered bowel habits, and reflux. She tells me that she has had increased intestinal gas over the past few months. This is manifested mostly as flatus. Some increased bloating as well. Also, her bowels have been alternating between soft consistency and constipation. She denies weight loss, GI bleeding, or abdominal pain. Finally, she has noted some increased regurgitation. She has self medicated with Pepto-Bismol which helps. She had previously been on Kapidex and changed herself  to AcipHex (which she had been on previously). She feels that this has helped. She states her symptoms have been better over the past few days. She also has some health related anxiety reported to me that an acquaintance, age 31, died of esophageal cancer. She inquires about her last colonoscopy and endoscopy as well as followup dates. Last colonoscopy was in 2007 with followup recommended in 2015. Her last upper endoscopy was in 2003. No Barrett's esophagus.. Reflux type symptoms or regurgitation is clearly worse with dietary indiscretion and clearly better when adherent reflux precautions.   GI Review of Systems    Reports acid reflux, belching, bloating, and  loss of appetite.      Denies abdominal pain, chest pain, dysphagia with liquids, dysphagia with solids, heartburn, nausea, vomiting, vomiting blood, weight loss, and  weight gain.      Reports change in bowel habits, constipation, diarrhea, and  irritable bowel syndrome.     Denies anal fissure, black tarry stools, diverticulosis, fecal  incontinence, heme positive stool, hemorrhoids, jaundice, light color stool, liver problems, rectal bleeding, and  rectal pain.    Current Medications (verified): 1)  Folic Acid 1 Mg  Tabs (Folic Acid) .... Once Daily 2)  Atrovent Hfa 17 Mcg/act  Aers (Ipratropium Bromide Hfa) 3)  Aspirin 81 Mg  Tabs (Aspirin) .... Once Daily 4)  Simvastatin 20 Mg Tabs (Simvastatin) .... Two Tablets Daily 5)  Calcium 600 1500 Mg  Tabs (Calcium Carbonate) .... Once Daily 6)  Vitamin D 1000 Unit  Tabs (Cholecalciferol) .... Once Daily 7)  Rhinocort Aqua 32 Mcg/act Susp (Budesonide (Nasal)) .[name].. 1-2 Spr Each Nostr Qd 8)  Aciphex 20 Mg Tbec (Rabeprazole Sodium) .... One Tablet By Mouth Once Daily 9)  Zyrtec Allergy 10 Mg Caps (Cetirizine Hcl) .... One Tablet By Mouth Once Daily  Allergies (verified): 1)  Prilosec  Past History:  Past Medical History: Reviewed history from 12/19/2008 and no changes required. GERD Osteoporosis Hyperlipidemia Osteoarthritis celebrex 200 mg daily, some improvement for foot pain. see intake sheet for full social/medical hx. Allergic rhinitis  Past Surgical History: Reviewed history from 09/04/2008 and no changes required. Unremarkable  Family History: M CAD Family History of Pancreatic Cancer:Father No FH of Colon Cancer:  Social History: Married Former Smoker Regular exercise-yes Occupation: Retired   Review of Systems       sinus allergies, joint aches,. All other systems reviewed and reported as negative  Vital Signs:  Patient profile:   75 year old female Height:      62 inches Pulse rate:   72 / minute Pulse rhythm:   regular BP sitting:   118 /  80  (left arm) Cuff size:   regular  Vitals Entered By: Ok Anis CMA (July 21, 2009 2:10 PM)   Physical Exam  General:  Well developed, well nourished, no acute distress. Head:  Normocephalic and atraumatic. Eyes:  PERRLA, no icterus. Nose:  No deformity, discharge,  or lesions. Mouth:  No  deformity or lesions, dentition normal. Neck:  Supple; no masses or thyromegaly. Lungs:  Clear throughout to auscultation. Heart:  Regular rate and rhythm; no murmurs, rubs,  or bruits. Abdomen:  Soft, nontender and nondistended. No masses, hepatosplenomegaly or hernias noted. Normal bowel sounds. Msk:  Symmetrical with no gross deformities. Normal posture. Pulses:  Normal pulses noted. Extremities:  No clubbing, cyanosis, edema or deformities noted. Neurologic:  Alert and  oriented x4;   Skin:  Intact without significant lesions or rashes. Cervical Nodes:  No significant cervical adenopathy.no supraclavicular adenopathy Psych:  Alert and cooperative. Normal mood and affect.   Impression & Recommendations:  Problem # 1:  IRRITABLE BOWEL SYNDROME (ICD-564.1) Problems today with bloating and alternating bowel habits most consistent with exacerbation of known irritable bowel syndrome. No worrisome features by history or physical exam. Nonspecifically better.  Plan #1. Reassurance #2. Empiric trial of probiotic align one daily for 2 weeks. Samples given  Problem # 2:  FLATULENCE-GAS-BLOATING (ICD-787.3) increased intestinal gas.  Plan #1. Gas and flatulence dietary sheet #2. Probiotic trial  Problem # 3:  GERD (ICD-530.81) Heartburn controlled with proton pump inhibitor therapy. She feels that there a list GI symptoms with AcipHex.  Plan #1 prescribed AcipHex 20 mg daily. Also, samples given. #2. Reflux precautions with attention to dietary measures #3. Followup p.r.n.  Weight was not documented today as patient declined; BMI could not be calculated.  Patient Instructions: 1)  Please pick up Aciphex at your pharmacy. 2)  Samples of Align given.  Take one daily for 2 weeks. 3)  The medication list was reviewed and reconciled.  All changed / newly prescribed medications were explained.  A complete medication list was provided to the patient / caregiver. Prescriptions: ACIPHEX  20 MG TBEC (RABEPRAZOLE SODIUM) one tablet by mouth once daily  #30 x 11   Entered by:   Ashok Cordia RN   Authorized by:   Hilarie Fredrickson MD   Signed by:   Ashok Cordia RN on 07/21/2009   Method used:   Electronically to        [facility]* (retail)       [address]       [place], [place]  [address]       Ph: [phone]       Fax: [phone]   RxID:   [id number]   Appended Document: Gas / GERD / Change in bowels ADDENDUM: Review of outside laboratories from March 2010 reveal normal CBC and comprehensive metabolic panel

## 2010-12-31 NOTE — Procedures (Signed)
Summary: EGD   EGD  Procedure date:  09/04/2002  Findings:      Findings: Duodenitis  Findings: Gastritis  Location: [facility]   Patient Name: [name], [name] MRN:  Procedure Procedures: Panendoscopy (EGD) CPT: 43235.    with biopsy(s)/brushing(s). CPT: D1846139.  Personnel: Endoscopist: Ulyess Mort, MD.  Exam Location: Exam performed in Outpatient Clinic. Outpatient  Patient Consent: Procedure, Alternatives, Risks and Benefits discussed, consent obtained, from patient. Consent was obtained by the RN.  Indications  Evaluation of Suspected: Upper GI bleed.  Symptoms: Nausea. Dyspepsia, Abdominal pain,  History  Pre-Exam Physical: Performed Sep 04, 2002  Abdominal exam, Extremity exam, Mental status exam WNL.  Exam Exam Info: Maximum depth of insertion Duodenum, intended Duodenum. Vocal cords visualized. Gastric retroflexion performed. Images taken. ASA Classification: II. Tolerance: good.  Sedation Meds: Patient assessed and found to be appropriate for moderate (conscious) sedation. Fentanyl 50 mcg. given IV. Versed 7 mg. given IV. Cetacaine Spray 2 sprays given aerosolized.  Monitoring: BP and pulse monitoring done. Oximetry used. Supplemental O2 given  Findings - HIATAL HERNIA: 3 cms. in length. ICD9: GERD: 530.81. - MUCOSAL ABNORMALITY: Body to Antrum. Granular mucosa. RUT done, results pending. ICD9: Gastritis, Chronic: 535.10. Comment: minnimal.  - MUCOSAL ABNORMALITY: Duodenal Bulb to Jejunum. Granular mucosa. Biopsy/Mucosal Abn taken. ICD9: Duodenitis without Hemorrhage: 535. 60.   Assessment Abnormal examination, see findings above.  Diagnoses: 530.81: GERD.  535.60: Duodenitis without Hemorrhage.  535.10: Gastritis, Chronic.   Events  Unplanned Intervention: No unplanned interventions were required.  Unplanned Events: There were no complications. Plans Medication(s): Continue current medications. PPI: Esomeprazole/Nexium 40  mg   Patient Education: Patient given standard instructions for: Hiatal Hernia. Mucosal Abnormality.  Disposition: After procedure patient sent to recovery. After recovery patient sent home.    cc: Linda Hedges. Plotnikov,MD    This report was created from the original endoscopy report, which was reviewed and signed by the above listed endoscopist.

## 2010-12-31 NOTE — Miscellaneous (Signed)
Summary: Orders Update  Clinical Lists Changes  Medications: Added new medication of KAPIDEX 60 MG CPDR (DEXLANSOPRAZOLE) Take 1 p.o. q.d. - Signed Rx of KAPIDEX 60 MG CPDR (DEXLANSOPRAZOLE) Take 1 p.o. q.d.;  #30 x 11;  Signed;  Entered by: Teryl Lucy RN;  Authorized by: Hilarie Fredrickson MD;  Method used: Electronically to [facility]*, [address] [place], [place]  [id number], Ph: [phone], Fax: [phone]    Prescriptions: KAPIDEX 60 MG CPDR (DEXLANSOPRAZOLE) Take 1 p.o. q.d.  #30 x 11   Entered by:   Teryl Lucy RN   Authorized by:   Hilarie Fredrickson MD   Signed by:   Teryl Lucy RN on 10/01/2008   Method used:   Electronically to        [facility]* (retail)       [address]       [place], [place]  [id number]       Ph: [phone]       Fax: [phone]   RxID:   [id number]

## 2010-12-31 NOTE — Assessment & Plan Note (Signed)
Summary: 1:30 SM appt per Dr. Hurman Horn     History of Present Illness: patient returns for orthotic fit testing  orthotics: Left with lower arch molding than Right, as expected given pt with pes planus L > R.  Medial forefoot posting 1/8" EVA applied on left.  Will try to make modifications to her tolerance.  she really wants to return to walking program and to lessen shin pain.     Current Allergies: PRILOSEC ZANTAC      Physical Exam  Msk:     with orthotic, pt has correction of pronation tendency, adequate calcaneal/midfoot alignment, actually in slight supination on L.  feels balanced in these orthotics.    Impression & Recommendations:  Problem # 1:  TIBIALIS TENDINITIS (ICD-726.72) we will add metatarsal pads, medium, given significant forefoot breakdown.  Pt tolerates this addition.  Return as needed for orthotic adjustment. pt voices understanding. Orders: Est Level 1- [facility] (04540)   Complete Medication List: 1)  Folic Acid 1 Mg Tabs (Folic acid) .... Once daily 2)  Allerx Df30 4-2.5 & 8-2.5 Mg Misc (Chlorpheniramine-methscop) .... Two times a day 3)  Atrovent Hfa 17 Mcg/act Aers (Ipratropium bromide hfa) 4)  Aciphex 20 Mg Tbec (Rabeprazole sodium) .... Once daily 5)  Aspirin 81 Mg Tabs (Aspirin) .... Once daily 6)  Zocor 20 Mg Tabs (Simvastatin) .... Two times a day 7)  Calcium 600 1500 Mg Tabs (Calcium carbonate) .... Once daily 8)  Vitamin D 1000 Unit Tabs (Cholecalciferol) .... Once daily 9)  Celebrex 200 Mg Caps (Celecoxib) .... Once daily 10)  Protonix 40 Mg Tbec (Pantoprazole sodium) .... Once daily     ]

## 2010-12-31 NOTE — Miscellaneous (Signed)
Summary: Orders Update  Clinical Lists Changes  Problems: Added new problem of CALF PAIN, LEFT (ICD-729.5) Orders: Added new Test order of Venous Duplex Lower Extremity (Venous Duplex Lower) - Signed

## 2010-12-31 NOTE — Miscellaneous (Signed)
Summary: celebrex   Clinical Lists Changes  Medications: Rx of CELEBREX 200 MG  CAPS (CELECOXIB) once daily;  #30 x 6;  Signed;  Entered by: Tora Perches;  Authorized by: [name] MD;  Method used: Electronic    Prescriptions: CELEBREX 200 MG  CAPS (CELECOXIB) once daily  #30 x 6   Entered by:   Tora Perches   Authorized by:   [name] MD   Signed by:   Tora Perches on 05/30/2008   Method used:   Electronically sent to ...       [facility]*       [address]       [place], [place]  [id number]       Ph: [phone]       Fax: [phone]   RxID:   [id number]

## 2010-12-31 NOTE — Assessment & Plan Note (Signed)
Summary: 9:15  Medications Added RHINOCORT AQUA 32 MCG/ACT SUSP (BUDESONIDE (NASAL)) 1-2 spr each nostr as needed PENNSAID 1.5 % SOLN (DICLOFENAC SODIUM) 3-5 gtt to skin three times daily as needed VOLTAREN 1 % GEL (DICLOFENAC SODIUM) as needed      Allergies Added:   Visit Type:  Follow-up Referring Provider:  n/a Primary Provider:  Sula Soda, MD   CC:  no complaints.  History of Present Illness: [name] is a 75 year old woman with a history of hyperlipidemia, mitral valve prolapse with mild mitral regurgitation (echo 12/109).  She has undergone CT angiogram at the [facility] few years ago which was totally normal.    She returns today for routine followup.  She is doing great.  She continues to walk, exercise with a trainer and do Pilates without any chest pain or dyspnea.  She has not had any palpitations.   Current Medications (verified): 1)  Atrovent Hfa 17 Mcg/act  Aers (Ipratropium Bromide Hfa) 2)  Aspirin 81 Mg  Tabs (Aspirin) .... Once Daily 3)  Simvastatin 20 Mg Tabs (Simvastatin) .... Two Tablets Daily 4)  Calcium 600 1500 Mg  Tabs (Calcium Carbonate) .... Once Daily 5)  Vitamin D 1000 Unit  Tabs (Cholecalciferol) .... Once Daily 6)  Rhinocort Aqua 32 Mcg/act Susp (Budesonide (Nasal)) .[name].. 1-2 Spr Each Nostr As Needed 7)  Aciphex 20 Mg Tbec (Rabeprazole Sodium) .... One Tablet By Mouth Once Daily 8)  Zyrtec Allergy 10 Mg Caps (Cetirizine Hcl) .... One Tablet By Mouth Once Daily 9)  Pennsaid 1.5 % Soln (Diclofenac Sodium) .... 3-5 Gtt To Skin Three Times Daily As Needed 10)  Voltaren 1 % Gel (Diclofenac Sodium) .... As Needed 11)  Lorazepam 0.5 Mg Tabs (Lorazepam) .[name].. 1 By Mouth Two Times A Day As Needed Anxiety  Allergies (verified): 1)  Prilosec  Past History:  Past Medical History: Last updated: 05/25/2010 1. GERD 2. Osteoporosis 3. Hyperlipidemia 4. Osteoarthritis 5. celebrex 200 mg daily, some improvement for foot pain. 6. see intake sheet for full  social/medical hx. 7. Allergic rhinitis 8. L Baker cyst Dr Lequita Halt 9. Torn menisci B kneees 10. Falsely elev TSH ( no Hypothyroidism) 2011 11. Mitral Valve Prolapse  Review of Systems       As per HPI and past medical history; otherwise all systems negative.   Vital Signs:  Patient profile:   75 year old female Height:      62 inches Pulse rate:   82 / minute BP sitting:   125 / 78  (left arm) Cuff size:   regular  Vitals Entered By: Burnett Kanaris, CNA (June 02, 2010 9:40 AM)  Physical Exam  General:  Well appearing. no resp difficulty HEENT: normal Neck: supple. no JVD. Carotids 2+ bilat; no bruits. No lymphadenopathy or thryomegaly appreciated. Cor: PMI nondisplaced. Regular rate & rhythm. No rubs, gallops, murmur. No click. Lungs: clear Abdomen: soft, nontender, nondistended. No hepatosplenomegaly. No bruits or masses. Good bowel sounds. Extremities: no cyanosis, clubbing, rash, edema Neuro: alert & orientedx3, cranial nerves grossly intact. moves all 4 extremities w/o difficulty. affect pleasant  Ears:  deformity of R pinna.     Impression & Recommendations:  Problem # 1:  MITRAL VALVE DISORDERS (ICD-424.0) Doing well. No symptoms. No significant murmur on exam. Due for q2 year echo in December.   Orders: Echocardiogram (Echo)  Patient Instructions: 1)  Your physician recommends that you schedule a follow-up appointment in: 12 months 2)  Your physician has requested that you have  an echocardiogram.  Echocardiography is a painless test that uses sound waves to create images of your heart. It provides your doctor with information about the size and shape of your heart and how well your heart's chambers and valves are working.  This procedure takes approximately one hour. There are no restrictions for this procedure. To be done in 6 months

## 2010-12-31 NOTE — Assessment & Plan Note (Signed)
Summary: yearly   stc   Vital Signs:  Patient profile:   75 year old female Height:      62 inches O2 Sat:      92 % on Room air Temp:     97.4 degrees F oral Pulse rate:   102 / minute BP sitting:   110 / 72  (left arm) Cuff size:   regular  Vitals Entered By: Lucious Groves (March 03, 2010 8:50 AM)  O2 Flow:  Room air CC: CPX--c/o neck pain and tightness./kb Is Patient Diabetic? No Pain Assessment Patient in pain? yes     Location: neck Intensity: 2 Type: sharp   Primary Care Provider:  Sula Soda, MD   CC:  CPX--c/o neck pain and tightness./kb.  History of Present Illness: The patient presents for a wellness examination Diet: Heart healthy   Physical activity - active.  Depression/mood screen: Negative.  Hearing: Intact  bilateral.  Visual Acuity: Grossly normal w/glasses.  ADL's: capable. Fall risk: None.  Home safety: good.  End of LifePlanning/Advanced directive - Full code. I agree. Discussed the need for AD.    Current Medications (verified): 1)  Folic Acid 1 Mg  Tabs (Folic Acid) .... Once Daily 2)  Atrovent Hfa 17 Mcg/act  Aers (Ipratropium Bromide Hfa) 3)  Aspirin 81 Mg  Tabs (Aspirin) .... Once Daily 4)  Simvastatin 20 Mg Tabs (Simvastatin) .... Two Tablets Daily 5)  Calcium 600 1500 Mg  Tabs (Calcium Carbonate) .... Once Daily 6)  Vitamin D 1000 Unit  Tabs (Cholecalciferol) .... Once Daily 7)  Rhinocort Aqua 32 Mcg/act Susp (Budesonide (Nasal)) .[name].. 1-2 Spr Each Nostr Qd 8)  Aciphex 20 Mg Tbec (Rabeprazole Sodium) .... One Tablet By Mouth Once Daily 9)  Zyrtec Allergy 10 Mg Caps (Cetirizine Hcl) .... One Tablet By Mouth Once Daily 10)  Pennsaid 1.5 % Soln (Diclofenac Sodium) .... 3-5 Gtt To Skin Three Times Daily  Allergies (verified): 1)  Prilosec  Past History:  Past Surgical History: Last updated: 09/04/2008 Unremarkable  Family History: Last updated: 07/21/2009 M CAD Family History of Pancreatic Cancer:Father No FH of Colon  Cancer:  Social History: Last updated: 07/21/2009 Married Former Smoker Regular exercise-yes Occupation: Retired   Past Medical History: GERD Osteoporosis Hyperlipidemia Osteoarthritis celebrex 200 mg daily, some improvement for foot pain. see intake sheet for full social/medical hx. Allergic rhinitis L Baker cyst Dr Lequita Halt Torn menisci B kneees Hypothyroidism 2011  Review of Systems  The patient denies anorexia, fever, weight loss, weight gain, vision loss, decreased hearing, hoarseness, chest pain, syncope, dyspnea on exertion, peripheral edema, prolonged cough, headaches, hemoptysis, abdominal pain, melena, hematochezia, severe indigestion/heartburn, hematuria, incontinence, genital sores, muscle weakness, suspicious skin lesions, transient blindness, difficulty walking, depression, unusual weight change, abnormal bleeding, enlarged lymph nodes, angioedema, and breast masses.    Physical Exam  General:  Well developed, well nourished, no acute distress. Ears:  External ear exam shows no significant lesions or deformities.  Otoscopic examination reveals clear canals, tympanic membranes are intact bilaterally without bulging, retraction, inflammation or discharge. Hearing is grossly normal bilaterally. Nose:  No deformity, discharge,  or lesions. Mouth:  No deformity or lesions, dentition normal.   Impression & Recommendations:  Problem # 1:  PHYSICAL EXAMINATION (ICD-V70.0) Assessment New Health and age related issues were discussed. Available screening tests and vaccinations were discussed as well. Healthy life style including good diet and execise was discussed. The labs were reviewed with the patient. Colon due 2015. Pneumovax due 2012 Orders:  EKG w/ Interpretation (93000) T-2 View CXR, Same Day (71020.5TC)  Problem # 2:  CALF PAIN, LEFT (ICD-729.5) Assessment: Comment Only  Orders: EKG w/ Interpretation (93000) T-2 View CXR, Same Day (71020.5TC)  Problem # 3:   HYPERLIPIDEMIA (ICD-272.4)  Her updated medication list for this problem includes:    Simvastatin 20 Mg Tabs (Simvastatin) .[name]..[name] Two tablets daily  Problem # 4:  GERD (ICD-530.81)  Her updated medication list for this problem includes:    Aciphex 20 Mg Tbec (Rabeprazole sodium) ..... One tablet by mouth once daily  Problem # 5:  HYPOTHYROIDISM (ICD-244.9) Assessment: New  Her updated medication list for this problem includes:    Synthroid 25 Mcg Tabs (Levothyroxine sodium) .[name]... 1 by mouth once daily for thyroid  Orders: EKG w/ Interpretation (93000) T-2 View CXR, Same Day (71020.5TC)  Complete Medication List: 1)  Folic Acid 1 Mg Tabs (Folic acid) .... Once daily 2)  Atrovent Hfa 17 Mcg/act Aers (Ipratropium bromide hfa) 3)  Aspirin 81 Mg Tabs (Aspirin) .... Once daily 4)  Simvastatin 20 Mg Tabs (Simvastatin) .... Two tablets daily 5)  Calcium 600 1500 Mg Tabs (Calcium carbonate) .... Once daily 6)  Vitamin D 1000 Unit Tabs (Cholecalciferol) .... Once daily 7)  Rhinocort Aqua 32 Mcg/act Susp (Budesonide (nasal)) .[name].. 1-2 spr each nostr qd 8)  Aciphex 20 Mg Tbec (Rabeprazole sodium) .... One tablet by mouth once daily 9)  Zyrtec Allergy 10 Mg Caps (Cetirizine hcl) .... One tablet by mouth once daily 10)  Pennsaid 1.5 % Soln (Diclofenac sodium) .... 3-5 gtt to skin three times daily 11)  Synthroid 25 Mcg Tabs (Levothyroxine sodium) .[name].. 1 by mouth once daily for thyroid  Patient Instructions: 1)  Please schedule a follow-up appointment in 6 months. 2)  TSH prior to visit, ICD-9: 244.8 in 1 months  3)  BMP prior to visit, ICD-9: in 6 mo 4)  Hepatic Panel prior to visit, ICD-9: 5)  Lipid Panel prior to visit, ICD-9: 6)  TSH prior to visit, ICD-9: Prescriptions: SYNTHROID 25 MCG TABS (LEVOTHYROXINE SODIUM) 1 by mouth once daily for thyroid  #90 x 3   Entered and Authorized by:   [name] MD   Signed by:   [name] MD on 03/03/2010   Method used:    Electronically to        [facility]* (retail)       [address]       [place], [place]  [id number]       Ph: [phone]       Fax: [phone]   RxID:   [id number]

## 2011-01-05 ENCOUNTER — Telehealth (INDEPENDENT_AMBULATORY_CARE_PROVIDER_SITE_OTHER): Payer: Self-pay | Admitting: *Deleted

## 2011-01-13 ENCOUNTER — Telehealth: Payer: Self-pay | Admitting: Internal Medicine

## 2011-01-14 NOTE — Progress Notes (Signed)
   Phone Note Call from Patient   Summary of Call: Requesting a refill on Aciphex given 2 refills and scheduled r.o.v.appt as she has not been in office since 06/2009. Initial call taken by: Teryl Lucy RN,  January 05, 2011 9:32 AM    Prescriptions: ACIPHEX 20 MG TBEC (RABEPRAZOLE SODIUM) one tablet by mouth once daily  #30 x 1   Entered by:   Teryl Lucy RN   Authorized by:   Hilarie Fredrickson MD   Signed by:   Teryl Lucy RN on 01/05/2011   Method used:   Electronically to        [facility]* (retail)       [address]       [place], [place]  [id number]       Ph: [phone]       Fax: [phone]   RxID:   [id number]

## 2011-01-20 NOTE — Progress Notes (Signed)
Summary: sooner appt   Phone Note Call from Patient Call back at Home Phone [phone]   Caller: Patient Call For: Dr Marina Goodell Reason for Call: Talk to Nurse Summary of Call: patient wants to be seen sooner than her appt 3-6 for reflux. Initial call taken by: Tawni Levy,  January 13, 2011 4:08 PM  Follow-up for Phone Call        Patient states she wants to be seen sooner than her 02/02/11 appointment for reflux. Offered appointment with [facility]level but she wants to see Dr. Marina Goodell. Dr. Marina Goodell has no openings. Note sent to Dr. Marina Goodell to see when to schedule patient. Follow-up by: Selinda Michaels RN,  January 13, 2011 4:21 PM  Additional Follow-up for Phone Call Additional follow up Details #1::        I can work her in [facility] Feb 24 @11 :30 am.  Additional Follow-up by: Hilarie Fredrickson MD,  January 13, 2011 5:00 PM    Additional Follow-up for Phone Call Additional follow up Details #2::    Patient aware of appointment date and time. Follow-up by: Selinda Michaels RN,  January 14, 2011 8:23 AM

## 2011-01-22 ENCOUNTER — Encounter: Payer: Self-pay | Admitting: Internal Medicine

## 2011-01-22 ENCOUNTER — Ambulatory Visit (INDEPENDENT_AMBULATORY_CARE_PROVIDER_SITE_OTHER): Payer: Medicare Other | Admitting: Internal Medicine

## 2011-01-22 DIAGNOSIS — K219 Gastro-esophageal reflux disease without esophagitis: Secondary | ICD-10-CM

## 2011-01-22 DIAGNOSIS — R141 Gas pain: Secondary | ICD-10-CM

## 2011-01-22 DIAGNOSIS — R143 Flatulence: Secondary | ICD-10-CM

## 2011-01-26 NOTE — Assessment & Plan Note (Signed)
Summary: [name]  and gas    History of Present Illness Visit Type: Follow-up Visit Primary GI MD: Yancey Flemings MD Primary Provider: Sonda Primes, MD Requesting Provider: self Chief Complaint: Patient c/o 3 weeks worsened GERD. She does state that as long as she is careful about what she eats, her reflux becomes better. There has been excessive belching and bloating as well as chest discomfort. Patient also c/o increased urgency to have BM and this occurs after eating. History of Present Illness:    75 year old with hyperlipidemia, osteoporosis, GERD,  and irritable bowel syndrome. She presents today with a chief complaint of worsening reflux , change in bowel habits, and increased intestinal gas. Last seen in August of 2010 for similar complaints. She tells me that she was vacationing in [place] for about one month. Significant dietary indiscretion. Worsening regurgitation, belching, and occasional pyrosis. Has been using AcipHex 20 mg daily occasionally needs additional dosage or Pepcid AC. Previous treatment with probiotic helpful with regards to gas. She request of refill of her prescription. Also with postprandial urgency in the morning. No fevers, weight loss, dysphagia, melena, hematochezia, or abdominal pain. Last colonoscopy was in 2007. Followup recommended in 2015. Last upper endoscopy in 2003 did not show Barrett's. She has been doing somewhat better recently with improvement in her diet   GI Review of Systems    Reports acid reflux, belching, bloating, chest pain, and  heartburn.      Denies abdominal pain, dysphagia with liquids, dysphagia with solids, loss of appetite, nausea, vomiting, vomiting blood, weight loss, and  weight gain.      Reports change in bowel habits and  irritable bowel syndrome.     Denies anal fissure, black tarry stools, constipation, diarrhea, diverticulosis, fecal incontinence, heme positive stool, hemorrhoids, jaundice, light color stool, liver problems, rectal  bleeding, and  rectal pain.    Current Medications (verified): 1)  Ipratropium Bromide Nasal Spray .... Use 2 Sprays Once-Twice Daily As Needed 2)  Aspirin 81 Mg  Tabs (Aspirin) .... Once Daily 3)  Simvastatin 20 Mg Tabs (Simvastatin) .... Two Tablets Daily 4)  Calcium 600 1500 Mg  Tabs (Calcium Carbonate) .... Once Daily 5)  Vitamin D 400 Unit Caps (Cholecalciferol) .... Take 1 Capsule By Mouth Once Daily 6)  Rhinocort Aqua 32 Mcg/act Susp (Budesonide (Nasal)) .[name].. 1-2 Spr Each Nostr As Needed 7)  Aciphex 20 Mg Tbec (Rabeprazole Sodium) .... One Tablet By Mouth Once Daily 8)  Zyrtec Allergy 10 Mg Caps (Cetirizine Hcl) .... One Tablet By Mouth Once Daily 9)  Pennsaid 1.5 % Soln (Diclofenac Sodium) .... 3-5 Gtt To Skin Three Times Daily As Needed 10)  Voltaren 1 % Gel (Diclofenac Sodium) .... As Needed 11)  Pepcid 20 Mg Tabs (Famotidine) .... Take 1 Tablet By Mouth Once Daily As Needed 12)  Gas-X Extra Strength 125 Mg Caps (Simethicone) .... Take 1-2 Capsules By Mouth As Needed  Allergies (verified): 1)  ! * Polysporin/neosporin 2)  Prilosec  Past History:  Past Medical History: Reviewed history from 09/02/2010 and no changes required. 1. GERD 2. Osteoporosis 3. Hyperlipidemia 4. Osteoarthritis 5. celebrex 200 mg daily, some improvement for foot pain. 6. see intake sheet for full social/medical hx. 7. Allergic rhinitis 8. L Baker cyst Dr Lequita Halt 9. Torn menisci B kneees 10. Falsely elev TSH ( no Hypothyroidism) 2011 11. Mitral Valve Prolapse 12. Cataract L>R Dr Dione Booze 13 Overactive bladder  Past Surgical History: Bilateral Cataract Extraction  Family History: M CAD Family History of  Pancreatic Cancer:Father No FH of Colon Cancer: Family History of Heart Disease: Mother  Social History: Married Former Smoker-stopped 40 years ago Regular exercise-yes Occupation: Retired  Alcohol Use - yes-socially Daily Caffeine Use  Review of Systems       The patient complains  of anxiety-new, change in vision, and night sweats.  The patient denies allergy/sinus, anemia, arthritis/joint pain, back pain, blood in urine, breast changes/lumps, confusion, cough, coughing up blood, depression-new, fainting, fatigue, fever, headaches-new, hearing problems, heart murmur, heart rhythm changes, itching, menstrual pain, muscle pains/cramps, nosebleeds, pregnancy symptoms, shortness of breath, skin rash, sleeping problems, sore throat, swelling of feet/legs, swollen lymph glands, thirst - excessive , urination - excessive , urination changes/pain, urine leakage, vision changes, and voice change.    Vital Signs:  Patient profile:   75 year old female Height:      62 inches Pulse rate:   84 / minute Pulse rhythm:   regular BP sitting:   120 / 70  (left arm)  Vitals Entered By: Lamona Curl CMA Duncan Dull) (January 22, 2011 11:46 AM) CC: Patient c/o 3 weeks worsened GERD. She does state that as long as she is careful about what she eats, her reflux becomes better. There has been excessive belching and bloating as well as chest discomfort. Patient also c/o increased urgency to have BM and this occurs after eating. Comments Patient refused weight check today.   Physical Exam  General:  Well developed, well nourished, no acute distress. Head:  Normocephalic and atraumatic. Eyes:   strabismus. No icterus Nose:  No deformity, discharge,  or lesions. Mouth:  No deformity or lesions. Neck:  Supple; no masses or thyromegaly. Lungs:  Clear throughout to auscultation. Heart:  Regular rate and rhythm; no murmurs, rubs,  or bruits. Abdomen:  Soft, nontender and nondistended. No masses, hepatosplenomegaly or hernias noted. Normal bowel sounds. Msk:  Symmetrical with no gross deformities. Normal posture. Pulses:  Normal pulses noted. Extremities:   no edema Neurologic:  Alert and  oriented x4. Skin:  Intact without significant lesions or rashes. Psych:  Alert and cooperative. Normal  mood and affect.   Impression & Recommendations:  Problem # 1:  GERD (ICD-530.81)  worse with dietary indiscretion.   plan  #1. reflux precautions  #2. refill AcipHex 20 mg daily  Problem # 2:  FLATULENCE-GAS-BLOATING (ICD-787.3)  no worrisome or alarm features. Chronic.   Plan : #1. probiotic Align one daily for 2 weeks.  May use on demand. 4 weeks samples given  Problem # 3:  IRRITABLE BOWEL SYNDROME (ICD-564.1)  ongoing. Reassurance.  Patient Instructions: 1)  Aciphex #90 x 3 RFs sent to [facility] 2)  Samples of Align given for you to take 1 by mouth once daily  3)  Copy sent to : Sonda Primes, MD 4)  The medication list was reviewed and reconciled.  All changed / newly prescribed medications were explained.  A complete medication list was provided to the patient / caregiver. Prescriptions: ACIPHEX 20 MG TBEC (RABEPRAZOLE SODIUM) one tablet by mouth once daily  #90 x 3   Entered by:   Milford Cage NCMA   Authorized by:   Hilarie Fredrickson MD   Signed by:   Milford Cage NCMA on 01/22/2011   Method used:   Electronically to        [facility]* (retail)       [address]       [place], [place]  [id number]  Ph: [phone]       Fax: [phone]   RxID:   [id number]

## 2011-02-02 ENCOUNTER — Ambulatory Visit: Payer: Self-pay | Admitting: Internal Medicine

## 2011-02-04 NOTE — Procedures (Signed)
Summary: Upper Endoscopy/[facility]  Upper Endoscopy/[facility]   Imported By: Sherian Rein 01/25/2011 14:37:35  _____________________________________________________________________  External Attachment:    Type:   Image     Comment:   External Document

## 2011-02-04 NOTE — Procedures (Signed)
Summary: [facility]   Imported By: Sherian Rein 01/25/2011 14:34:04  _____________________________________________________________________  External Attachment:    Type:   Image     Comment:   External Document

## 2011-02-04 NOTE — Procedures (Signed)
Summary: [facility]   Imported By: Sherian Rein 01/25/2011 14:35:17  _____________________________________________________________________  External Attachment:    Type:   Image     Comment:   External Document

## 2011-02-10 ENCOUNTER — Encounter: Payer: Self-pay | Admitting: Obstetrics and Gynecology

## 2011-02-25 ENCOUNTER — Encounter (INDEPENDENT_AMBULATORY_CARE_PROVIDER_SITE_OTHER): Payer: Medicare Other | Admitting: Obstetrics and Gynecology

## 2011-02-25 DIAGNOSIS — M899 Disorder of bone, unspecified: Secondary | ICD-10-CM

## 2011-02-25 DIAGNOSIS — M949 Disorder of cartilage, unspecified: Secondary | ICD-10-CM

## 2011-02-25 DIAGNOSIS — D259 Leiomyoma of uterus, unspecified: Secondary | ICD-10-CM

## 2011-02-25 DIAGNOSIS — N949 Unspecified condition associated with female genital organs and menstrual cycle: Secondary | ICD-10-CM

## 2011-02-25 DIAGNOSIS — N951 Menopausal and female climacteric states: Secondary | ICD-10-CM

## 2011-02-25 DIAGNOSIS — N952 Postmenopausal atrophic vaginitis: Secondary | ICD-10-CM

## 2011-03-04 DIAGNOSIS — Z1211 Encounter for screening for malignant neoplasm of colon: Secondary | ICD-10-CM

## 2011-03-10 ENCOUNTER — Other Ambulatory Visit: Payer: Self-pay | Admitting: Plastic Surgery

## 2011-03-15 ENCOUNTER — Encounter: Payer: Self-pay | Admitting: Internal Medicine

## 2011-03-15 ENCOUNTER — Ambulatory Visit (INDEPENDENT_AMBULATORY_CARE_PROVIDER_SITE_OTHER): Payer: Medicare Other | Admitting: Internal Medicine

## 2011-03-15 DIAGNOSIS — Z0000 Encounter for general adult medical examination without abnormal findings: Secondary | ICD-10-CM | POA: Insufficient documentation

## 2011-03-15 DIAGNOSIS — E782 Mixed hyperlipidemia: Secondary | ICD-10-CM

## 2011-03-15 DIAGNOSIS — M8580 Other specified disorders of bone density and structure, unspecified site: Secondary | ICD-10-CM

## 2011-03-15 DIAGNOSIS — K219 Gastro-esophageal reflux disease without esophagitis: Secondary | ICD-10-CM

## 2011-03-15 DIAGNOSIS — E785 Hyperlipidemia, unspecified: Secondary | ICD-10-CM

## 2011-03-15 HISTORY — DX: Mixed hyperlipidemia: E78.2

## 2011-03-15 MED ORDER — RANITIDINE HCL 300 MG PO TABS: 300.0000 mg | ORAL_TABLET | Freq: Every day | ORAL | Status: DC

## 2011-03-15 NOTE — Assessment & Plan Note (Signed)
On Rx 

## 2011-03-15 NOTE — Assessment & Plan Note (Signed)
The patient is here for annual Medicare wellness examination and management of other chronic and acute problems.   The risk factors are reflected in the social history.  The roster of all physicians providing medical care to patient - is listed in the Snapshot section of the chart.  Activities of daily living:  The patient is 100% inedpendent in all ADLs: dressing, toileting, feeding as well as independent mobility  Home safety : The patient has smoke detectors in the home. They wear seatbelts.No firearms at home ( firearms are present in the home, kept in a safe fashion). There is no violence in the home.   There is no risks for hepatitis, STDs or HIV. There is no   history of blood transfusion. They have no travel history to infectious disease endemic areas of the world.  The patient has (has not) seen their dentist in the last six month. They have (not) seen their eye doctor in the last year. They deny (admit to) any hearing difficulty and have not had audiologic testing in the last year.  They do not  have excessive sun exposure. Discussed the need for sun protection: hats, long sleeves and use of sunscreen if there is significant sun exposure.   Diet: the importance of a healthy diet is discussed. They do have a healthy (unhealthy-high fat/fast food) diet.  The patient has a regular exercise program: ______1_ , _h___duration, __5___per week.  The benefits of regular aerobic exercise were discussed.  Depression screen: there are no signs or vegative symptoms of depression- irritability, change in appetite, anhedonia, sadness/tearfullness.  Cognitive assessment: the patient manages all their financial and personal affairs and is actively engaged. They could relate day,date,year and events; recalled 3/3 objects at 3 minutes; performed clock-face test normally.  The following portions of the patient's history were reviewed and updated as appropriate: allergies, current medications, past family  history, past medical history,  past surgical history, past social history  and problem list.  Vision, hearing, body mass index were assessed and reviewed.   During the course of the visit the patient was educated and counseled about appropriate screening and preventive services including : fall prevention , diabetes screening, nutrition counseling, colorectal cancer screening, and recommended immunizations.

## 2011-03-15 NOTE — Assessment & Plan Note (Signed)
Will add Ranitidine at hs

## 2011-03-15 NOTE — Progress Notes (Signed)
Subjective:    Patient ID: [name], female    DOB: Jun 16, 1936, 75 y.o.   MRN: [id number]  HPI  The patient is here for a wellness exam. The patient has been doing well overall without major physical or psychological issues going on lately. The patient needs to address her chronic hypertension that has been well controlled with medicines; to address chronic  hyperlipidemia controlled with medicines as well; and  GERD, controlled with medical treatment and diet.   Review of Systems  Constitutional: Negative.  Negative for fever, chills, diaphoresis, activity change, appetite change, fatigue and unexpected weight change.  HENT: Negative for hearing loss, ear pain, nosebleeds, congestion, sore throat, facial swelling, rhinorrhea, sneezing, mouth sores, trouble swallowing, neck pain, neck stiffness, postnasal drip, sinus pressure and tinnitus.   Eyes: Negative for pain, discharge, redness, itching and visual disturbance.  Respiratory: Negative for cough, chest tightness, shortness of breath, wheezing and stridor.   Cardiovascular: Negative for chest pain, palpitations and leg swelling.  Gastrointestinal: Negative for nausea, diarrhea, constipation, blood in stool, abdominal distention, anal bleeding and rectal pain.       GERD sx's  Genitourinary: Negative for dysuria, urgency, frequency, hematuria, flank pain, vaginal bleeding, vaginal discharge, difficulty urinating, genital sores and pelvic pain.  Musculoskeletal: Negative for back pain, joint swelling, arthralgias and gait problem.  Skin: Negative.  Negative for rash.  Neurological: Negative for dizziness, tremors, seizures, syncope, speech difficulty, weakness, numbness and headaches.  Hematological: Negative for adenopathy. Does not bruise/bleed easily.  Psychiatric/Behavioral: Negative for suicidal ideas, behavioral problems, sleep disturbance, dysphoric mood and decreased concentration. The patient is not nervous/anxious.          Objective:   Physical Exam  Constitutional: She appears well-developed and well-nourished. No distress.  HENT:  Head: Normocephalic.  Right Ear: External ear normal.  Left Ear: External ear normal.  Nose: Nose normal.  Mouth/Throat: Oropharynx is clear and moist.  Eyes: Conjunctivae are normal. Pupils are equal, round, and reactive to light. Right eye exhibits no discharge. Left eye exhibits no discharge.  Neck: Normal range of motion. Neck supple. No JVD present. No tracheal deviation present. No thyromegaly present.  Cardiovascular: Normal rate, regular rhythm and normal heart sounds.   Pulmonary/Chest: No stridor. No respiratory distress. She has no wheezes.  Abdominal: Soft. Bowel sounds are normal. She exhibits no distension and no mass. There is no tenderness. There is no rebound and no guarding.  Musculoskeletal: She exhibits no edema and no tenderness.  Lymphadenopathy:    She has no cervical adenopathy.  Neurological: She displays normal reflexes. No cranial nerve deficit. She exhibits normal muscle tone. Coordination normal.  Skin: No rash noted. No erythema.  Psychiatric: She has a normal mood and affect. Her behavior is normal. Judgment and thought content normal.          Assess ment & Plan:  GERD Will add Ranitidine at hs  Well adult exam The patient is here for annual Medicare wellness examination and management of other chronic and acute problems.   The risk factors are reflected in the social history.  The roster of all physicians providing medical care to patient - is listed in the Snapshot section of the chart.  Activities of daily living:  The patient is 100% inedpendent in all ADLs: dressing, toileting, feeding as well as independent mobility  Home safety : The patient has smoke detectors in the home. They wear seatbelts.No firearms at home ( firearms are present in the home, kept in  a safe fashion). There is no violence in the home.   There is no  risks for hepatitis, STDs or HIV. There is no   history of blood transfusion. They have no travel history to infectious disease endemic areas of the world.  The patient has (has not) seen their dentist in the last six month. They have (not) seen their eye doctor in the last year. They deny (admit to) any hearing difficulty and have not had audiologic testing in the last year.  They do not  have excessive sun exposure. Discussed the need for sun protection: hats, long sleeves and use of sunscreen if there is significant sun exposure.   Diet: the importance of a healthy diet is discussed. They do have a healthy (unhealthy-high fat/fast food) diet.  The patient has a regular exercise program: ______1_ , _h___duration, __5___per week.  The benefits of regular aerobic exercise were discussed.  Depression screen: there are no signs or vegative symptoms of depression- irritability, change in appetite, anhedonia, sadness/tearfullness.  Cognitive assessment: the patient manages all their financial and personal affairs and is actively engaged. They could relate day,date,year and events; recalled 3/3 objects at 3 minutes; performed clock-face test normally.  The following portions of the patient's history were reviewed and updated as appropriate: allergies, current medications, past family history, past medical history,  past surgical history, past social history  and problem list.  Vision, hearing, body mass index were assessed and reviewed.   During the course of the visit the patient was educated and counseled about appropriate screening and preventive services including : fall prevention , diabetes screening, nutrition counseling, colorectal cancer screening, and recommended immunizations.   Hyperlipemia, mixed On Rx

## 2011-03-16 ENCOUNTER — Other Ambulatory Visit (INDEPENDENT_AMBULATORY_CARE_PROVIDER_SITE_OTHER): Payer: Medicare Other | Admitting: Internal Medicine

## 2011-03-16 ENCOUNTER — Other Ambulatory Visit (INDEPENDENT_AMBULATORY_CARE_PROVIDER_SITE_OTHER): Payer: Medicare Other

## 2011-03-16 DIAGNOSIS — E785 Hyperlipidemia, unspecified: Secondary | ICD-10-CM

## 2011-03-16 DIAGNOSIS — M8580 Other specified disorders of bone density and structure, unspecified site: Secondary | ICD-10-CM

## 2011-03-16 DIAGNOSIS — Z0000 Encounter for general adult medical examination without abnormal findings: Secondary | ICD-10-CM

## 2011-03-16 LAB — CBC WITH DIFFERENTIAL/PLATELET
Basophils Absolute: 0.1 10*3/uL (ref 0.0–0.1)
Basophils Relative: 0.7 % (ref 0.0–3.0)
Eosinophils Absolute: 0.1 10*3/uL (ref 0.0–0.7)
Eosinophils Relative: 1.9 % (ref 0.0–5.0)
HCT: 39.5 % (ref 36.0–46.0)
Hemoglobin: 13.5 g/dL (ref 12.0–15.0)
Lymphocytes Relative: 23.9 % (ref 12.0–46.0)
Lymphs Abs: 1.7 10*3/uL (ref 0.7–4.0)
MCHC: 34.1 g/dL (ref 30.0–36.0)
MCV: 93.9 fl (ref 78.0–100.0)
Monocytes Absolute: 0.6 10*3/uL (ref 0.1–1.0)
Monocytes Relative: 8 % (ref 3.0–12.0)
Neutro Abs: 4.6 10*3/uL (ref 1.4–7.7)
Neutrophils Relative %: 65.5 % (ref 43.0–77.0)
Platelets: 251 10*3/uL (ref 150.0–400.0)
RBC: 4.2 Mil/uL (ref 3.87–5.11)
RDW: 13.7 % (ref 11.5–14.6)
WBC: 7 10*3/uL (ref 4.5–10.5)

## 2011-03-16 LAB — TSH: TSH: 3.23 u[IU]/mL (ref 0.35–5.50)

## 2011-03-16 LAB — LIPID PANEL
Cholesterol: 190 mg/dL (ref 0–200)
HDL: 69.7 mg/dL (ref >39.00)
LDL Cholesterol: 105 mg/dL — ABNORMAL HIGH (ref 0–99)
Total CHOL/HDL Ratio: 3
Triglycerides: 75 mg/dL (ref 0.0–149.0)
VLDL: 15 mg/dL (ref 0.0–40.0)

## 2011-03-16 LAB — COMPREHENSIVE METABOLIC PANEL
ALT: 29 U/L (ref 0–35)
AST: 27 U/L (ref 0–37)
Albumin: 3.9 g/dL (ref 3.5–5.2)
Alkaline Phosphatase: 42 U/L (ref 39–117)
BUN: 18 mg/dL (ref 6–23)
CO2: 30 mEq/L (ref 19–32)
Calcium: 9.2 mg/dL (ref 8.4–10.5)
Chloride: 104 mEq/L (ref 96–112)
Creatinine, Ser: 0.7 mg/dL (ref 0.4–1.2)
GFR: 93.38 mL/min (ref >60.00)
Glucose, Bld: 88 mg/dL (ref 70–99)
Potassium: 4.5 mEq/L (ref 3.5–5.1)
Sodium: 140 mEq/L (ref 135–145)
Total Bilirubin: 0.3 mg/dL (ref 0.3–1.2)
Total Protein: 7.4 g/dL (ref 6.0–8.3)

## 2011-03-16 LAB — URINALYSIS, ROUTINE W REFLEX MICROSCOPIC
Bilirubin Urine: NEGATIVE
Hgb urine dipstick: NEGATIVE
Ketones, ur: NEGATIVE
Nitrite: NEGATIVE
Specific Gravity, Urine: 1.015 (ref 1.000–1.030)
Total Protein, Urine: NEGATIVE
Urine Glucose: NEGATIVE
Urobilinogen, UA: 0.2 (ref 0.0–1.0)
pH: 6 (ref 5.0–8.0)

## 2011-03-17 ENCOUNTER — Telehealth: Payer: Self-pay | Admitting: Internal Medicine

## 2011-03-17 LAB — VITAMIN D 25 HYDROXY (VIT D DEFICIENCY, FRACTURES): Vit D, 25-Hydroxy: 41 ng/mL (ref 30–89)

## 2011-03-17 NOTE — Telephone Encounter (Signed)
[name] , please, inform the patient:  the labs are ok  Please, mail the labs to pt  Thank you !

## 2011-03-18 NOTE — Telephone Encounter (Signed)
Pt informed/ copies mailed.  

## 2011-03-22 ENCOUNTER — Telehealth: Payer: Self-pay | Admitting: *Deleted

## 2011-03-22 NOTE — Telephone Encounter (Signed)
Patient requesting call from MD. She is upset over some of the results she just received in the mail.

## 2011-03-23 NOTE — Telephone Encounter (Signed)
Called. Left a VM. Labs OK

## 2011-04-07 ENCOUNTER — Other Ambulatory Visit: Payer: Self-pay | Admitting: Internal Medicine

## 2011-04-13 NOTE — Assessment & Plan Note (Signed)
[facility]                            CARDIOLOGY OFFICE NOTE   NAME:COHENZinnia, Tindall                          MRN:          [id number]  DATE:04/10/2008                            DOB:          May 16, 1936    PRIMARY CARE PHYSICIAN:  Georgina Quint. Plotnikov, M.D.   INTERVAL HISTORY:  Gwynneth is a delightful 75 year old woman with a  history of hyperlipidemia, mitral valve prolapse with only trivial  mitral regurgitation.  She has undergone a CT angiogram at the [facility] recently which was totally normal.  She returns today for routine  follow-up.  She is doing just great.  She is walking multiple days a  week and also doing palates with a Systems analyst.  She has not had  any symptoms.  No chest pain or shortness of breath.  No palpitations.  No heart failure.  She is not having any problems with her Zocor. 10.   REVIEW OF SYSTEMS:  Review of systems is notable for some mild arthritis  but otherwise all systems negative.  She is having some night sweats at  night similar to her previous perimenopausal symptoms.   CURRENT MEDICATIONS:  1. Ipratropium nasal spray.  2. Folic acid.  3. Aciphex.  4. Zantac 75 at bedtime.  5. Aspirin 81 a day.  6. Boniva.  7. Simvastatin 20 b.i.d.  8. Calcium.  9. Vitamin D.  10.Celebrex 200.   PHYSICAL EXAMINATION:  GENERAL:  She is vigorous-appearing.  Looks much  younger than her stated age.  Ambulates around the clinic without  respiratory difficulty.  VITAL SIGNS:  Blood pressure is 112/68, heart rate 85, weights 122.  HEENT is normal.  NECK:  Supple.  No JVD.  Carotid 2+ bilaterally bruits.  There is no  lymphadenopathy or thyromegaly.  CARDIAC:  PMI is nondisplaced.  Regular rate and rhythm.  No murmurs,  rubs or gallops.  I did not hear a click.  LUNGS:  Clear.  ABDOMEN:  Soft, nontender, nondistended.  No hepatosplenomegaly, no  bruits, no masses.  Good bowel sounds.  EXTREMITIES:  Warm with  cyanosis,  clubbing or edema.  No rash.  NEURO:  Alert and x3.  Cranial nerves II-XII intact.  Moves all four  extremities without difficulty.  Affect is very pleasant.   LABORATORY DATA:  Normal sinus rhythm at rate 82, no ST-T wave  abnormalities.  A total cholesterol is 144, HDL is 44, LDL is 81.  Triglycerides are 94.  LFTs are normal.   ASSESSMENT/PLAN:  1. Mitral valve prolapse.  This is just minimal.  She is doing well      without any significant palpitations.  We will get an      echocardiogram about every 2 to 3 years to follow.  Chronic      hyperlipidemia.  LDL is at goal.  Continue current therapy.  Will      see her every 6 to 9 months for routine follow-up.  2. Night sweats.  These sound like perimenopausal symptoms.  We did      discuss the  possibility of starting something like Effexor but this      be followed by Dr. Posey Rea.     Bevelyn Buckles. Bensimhon, MD  Electronically Signed    DRB/MedQ  DD: 04/10/2008  DT: 04/10/2008  Job #: [id number]

## 2011-04-13 NOTE — Assessment & Plan Note (Signed)
[facility]                            CARDIOLOGY OFFICE NOTE   NAME:[name], [name]                          MRN:          [id number]  DATE:05/03/2007                            DOB:          04-05-1936    HISTORY OF PRESENT ILLNESS:  [name] is a very pleasant 75 year old  white married female with strong family history of coronary artery  disease, hyperlipidemia, as well as mitral valve prolapse. In February  2004, the patient had chest pain and negative Cardiolite. Because of  recurrent pain, she subsequently underwent angiography, which revealed  normal coronary arteries. She has also been evaluated last year at the  [facility] where CT angiogram was normal. She was seen on March 28, 2007 with atypical chest pain. The symptoms did awaken her from sleep.  She underwent a stress Myoview, which was normal and she is now feeling  well. It should also be noted that she has significant hyperlipidemia  with LDL elevation up to 195. Started on Vytorin 10/20. She was  concerned about the Vytorin after recent reports and we changed it to  Simvastatin 20 and recent LDL was 111.   MEDICATIONS:  As previously outlined.   PHYSICAL EXAMINATION:  VITAL SIGNS:  Blood pressure 102/64, pulse 76.  CARDIOVASCULAR:  Normal sinus rhythm.  GENERAL:  Appearance normal.  LUNGS:  Clear.   IMPRESSION:  1. No evidence of coronary artery disease.  2. Hyperlipidemia with recent elevation with change in medicine from      Vytorin 10/20 to Simvastatin 20.   PLAN:  We plan to increase her Simvastatin to 40. Will check her lipid  and LFT's in 3 months and have her see Dr. Nicholes [name] for continued  followup in 6 months.     [name] Cranker, MD, [name]  Electronically Signed    EJL/MedQ  DD: 05/03/2007  DT: 05/03/2007  Job #: [id number]

## 2011-04-13 NOTE — Assessment & Plan Note (Signed)
[facility]                            CARDIOLOGY OFFICE NOTE   NAME:COHENNorabelle, Kondo                          MRN:          [id number]  DATE:10/25/2008                            DOB:          1936/10/01    PRIMARY CARE PHYSICIAN:  Georgina Quint. Plotnikov, MD   HISTORY:  [name] is a 75 year old woman with a history of hyperlipidemia,  mitral valve prolapse with trivial mitral regurgitation.  She has  undergone CT angiogram at the [facility] few years ago which was  totally normal.  She returns today for routine followup.  She is doing  great.  She continues to walk and do parodies without any chest pain or  dyspnea.  She has not had any palpitations.   CURRENT MEDICATION:  AlleRx, Atrovent nasal spray, folic acid, aspirin  81, simvastatin 20 b.i.d., Celebrex 200 a day, calcium and vitamin D.   PHYSICAL EXAMINATION:  GENERAL:  She is vigorous appearing, looks much  younger than stated age, ambulates around the clinic without respiratory  difficulty.  HEENT:  Normal.  NECK:  Supple.  No JVD.  Carotids are 2+ bilaterally without bruits.  There is no lymphadenopathy or thyromegaly.  CARDIAC:  PMI is nondisplaced.  She has a regular rate and rhythm, very  soft systolic murmur at the apex.  LUNGS:  Clear.  ABDOMEN:  Soft, nontender, nondistended, no hepatosplenomegaly, no  bruits, no masses.  Good bowel sounds.  EXTREMITIES:  Warm with no cyanosis, clubbing, or edema.  No rash.  NEURO:  Alert and oriented x3.  Cranial nerves II through XII are  intact.  Moves all 4 extremities without difficulty.  Affect is  pleasant.   EKG shows normal sinus rhythm at a rate of 87.  No ST-T wave  abnormalities.   ASSESSMENT AND PLAN:  1. Mitral valve prolapse.  We will get echocardiogram to reevaluate      the valve.  This has always been mild.  We will follow every 2      years.  2. Hyperlipidemia.  It is followed by Dr. Posey Rea.  Continue statin.     Bevelyn Buckles.  Bensimhon, MD  Electronically Signed    DRB/MedQ  DD: 10/25/2008  DT: 10/25/2008  Job #: [id number]   cc:   Georgina Quint. Plotnikov, MD

## 2011-04-13 NOTE — Assessment & Plan Note (Signed)
[facility]                            CARDIOLOGY OFFICE NOTE   NAME:COHENMarveen, Donlon                          MRN:          [id number]  DATE:09/13/2007                            DOB:          04-15-1936    INTERVAL HISTORY:  [name] is a delightful 75 year old woman  previously followed by Dr. Corinda Gubler.  She has a history of hyperlipidemia  and mitral valve prolapse with only a trivial mitral regurgitation.  She  has recently been to the [facility] where she had a CT angiogram which  was totally normal.  She returns today just for routine followup.   She has been doing great.  She walks 3 miles four to five times a week,  does Pilates and exercises with a personal trainer, and is feeling quite  well.  No chest pain or shortness of breath.  She is interested in  decreasing her dose of Zocor if possible back to 20 mg a day.  She  denies any significant myalgias, however.   CURRENT MEDICATIONS:  1. Ipratropium nasal spray.  2. Folic acid.  3. AcipHex.  4. Zantac 75 a day.  5. Aspirin 81 a day.  6. Boniva every month.  7. Align.  8. Simvastatin 20 b.i.d.  9. Calcium.  10.Vitamin D.   PHYSICAL EXAMINATION:  She is vigorous-appearing, in no acute distress.  Ambulates around the clinic without any respiratory difficulty.  Blood  pressure is 114/78, heart rate is 83.  She refused to be weighed.  HEENT:  Normal.  NECK:  Supple.  No JVD.  Carotids are 2+ bilaterally without any bruits.  There is no lymphadenopathy or thyromegaly.  CARDIAC:  PMI is not displaced.  She has a regular rate and rhythm.  No  murmurs, rubs, or gallops.  I do not hear a click.  LUNGS:  Clear.  ABDOMEN:  Soft, nontender, nondistended.  There is no  hepatosplenomegaly, no bruits, no masses, good bowel sounds.  EXTREMITIES:  Warm with no cyanosis, clubbing or edema.  No rash.  NEUROLOGIC:  She is alert and oriented x3.  Cranial nerves II-XII are  intact.  Moves all four  extremities without difficulty.  Affect is very  pleasant.   EKG shows normal sinus rhythm at a rate of 83 with no ST-T wave  abnormalities, normal axis and intervals.  In September, total  cholesterol 164, triglycerides 82, HDL 50 and LDL 98, down from 111.   ASSESSMENT AND PLAN:  1. Mitral valve prolapse.  This is stable.  Will get a routine      followup echocardiogram as she has not had one in about 5 years.  2. Hyperlipidemia.  Goal LDL is under 100.  Will keep her Zocor just      where it is at.   DISPOSITION:  We will see her back in 6 months for a routine followup.     Bevelyn Buckles. Bensimhon, MD  Electronically Signed    DRB/MedQ  DD: 09/13/2007  DT: 09/14/2007  Job #: [id number]

## 2011-04-16 NOTE — H&P (Signed)
NAME:  [name], [name]                           ACCOUNT NO.:  [id number]   MEDICAL RECORD NO.:  [id number]                   PATIENT TYPE:  INP   LOCATION:  1824                                 FACILITY:  [facility]   PHYSICIAN:  Olga Millers, M.D.                DATE OF BIRTH:  December 27, 1935   DATE OF ADMISSION:  04/25/2003  DATE OF DISCHARGE:                                HISTORY & PHYSICAL   HISTORY OF PRESENT ILLNESS:  [name] is a very pleasant 75 year old female  with a past medical history of question mitral valve prolapse,  hyperlipidemia, gastroesophageal reflux disease, and arthritis who presents  with chest pain.  She has no prior cardiac history.  Over the past two  weeks, she complains of pain in her left breast area that occurs with  exertion and is relieved with rest.  It does not radiate nor is it  associated with nausea, vomiting, shortness of breath, or diaphoresis.  The  pain is not related to position or food.  It typically lasts about 10  minutes and resolves with rest.  She did apparently have a nuclear study in  the office on Apr 19, 2003, that showed no ischemia.  She was seen by Dr.  Glennon Hamilton on Apr 23, 2003, and she was scheduled to see Shelby Dubin in the  [facility], and also an echocardiogram was ordered.  It was felt that if  she had recurrent exertional chest discomfort that cardiac catheterization  would be indicated.  She did have pain this morning and is now pain-free in  the emergency room.   MEDICATIONS:  1. Folate.  2. Allegra.  3. Nexium.  4. Lipitor.  5. Fosamax.  6. Calcium.   ALLERGIES:  She is allergic to [facility] and PENICILLIN.   PAST MEDICAL HISTORY:  1. Significant for hyperlipidemia, but there is no diabetes mellitus or     hypertension.  2. She also has gastroesophageal reflux disease.  3. Arthritis.   PAST SURGICAL HISTORY:  She has had a prior tonsillectomy.   FAMILY HISTORY:  Positive for coronary artery disease.   SOCIAL HISTORY:  She does not smoke, but she does consume alcohol.   REVIEW OF SYSTEMS:  She denies any headaches or fevers or chills.  There is  no productive cough or hemoptysis.  There is no dysphagia, odynophagia,  melena, or hematochezia.  There is no dysuria or hematuria.  There is no  rash or seizure activity.  She denies any orthopnea, PND, or pedal edema.  The remainder of systems is negative.   PHYSICAL EXAMINATION:  VITAL SIGNS:  Her physical exam today shows a blood  pressure of 133/63, and her pulse is 82.  She is afebrile.  GENERAL:  She is well developed and well nourished, in no acute distress.  Her skin is warm and dry.  She does not appear to be  depressed.  There is no  peripheral clubbing.  HEENT:  Unremarkable with no abnormalities.  NECK:  Supple and moves bilaterally, and no bruits are noted.  There is no  jugular venous distention or thyromegaly.  CHEST:  Clear to auscultation in all fields.  CARDIOVASCULAR EXAM:  Reveals a regular rate and rhythm, normal S1 and S2.  There are no murmurs, rubs, or gallops noted.  ABDOMINAL EXAM:  Nontender, nondistended.  Positive bowel sounds.  No  hepatosplenomegaly.  No masses appreciated.  There is no abdominal bruit.  She has 2+ femoral pulses bilaterally and no bruits.  EXTREMITIES:  She has no edema that I can palpate.  No cords.  She has 2+  dorsalis pedis pulses bilaterally.  NEUROLOGICAL EXAM:  Grossly intact.   LABORATORY DATA:  Her electrocardiogram shows a normal sinus rhythm at a  rate of 69.  The axes are normal.  There are no ST changes.  Her chest x-ray  shows no active disease.  Her labs are pending at the time of this  dictation.   DIAGNOSES:  1. Chest pain.  2. Recent negative nuclear study.  3. Hyperlipidemia.  4. Gastroesophageal reflux disease.  5. History of arthritis.   PLAN:  [name] presents with recurrent chest pain that sounds concerning  for coronary artery disease.  Her symptoms occur with  exertion and are  relieved with rest.  As per Dr. Gabriel Rung, we will plan to proceed with a cardiac  catheterization; the risks and benefits have been discussed, and she agrees  to proceed.  We will also cycle enzymes to exclude ischemia.  We will also  treat with aspirin, heparin, nitroglycerin, and low-dose beta blockade.  She  will need followup liver functions and cholesterol panel in the future, as  Dr. Gabriel Rung recently initiated her on a statin.  We will also plan to proceed  with an echocardiogram as an outpatient, per Dr. Gabriel Rung, to rule out mitral  valve prolapse.                                               Olga Millers, M.D.    BC/MEDQ  D:  04/25/2003  T:  04/25/2003  Job:  [id number]

## 2011-04-16 NOTE — Assessment & Plan Note (Signed)
[facility]                            CARDIOLOGY OFFICE NOTE   NAME:Carstens, CELESTINA                          MRN:          [id number]  DATE:03/28/2007                            DOB:          11-May-1936    [name] is a very pleasant 75 year old white married female with a  strong family history of coronary artery disease and hyperlipidemia as  well as mitral valve prolapse.   In February 2004, the patient had chest pain and negative Cardiolite.   Because of recurrent chest pain, she had subsequent coronary  angiography, which revealed normal coronary arteries.   She was also evaluated last year at the [facility], where CT angiogram  apparently was normal.   Over the past 5 days, she has had several episodes of left arm tingling  and pain radiating up to the left shoulder; there has been no chest  discomfort. Prior symptoms were described more as a chest pressure.  She  has had no associated shortness of breath, diaphoresis, dizziness,  nausea.  She has had marked hyperlipidemia with LDL falling from 193 to  63 on Vytorin 10/20.   PHYSICAL EXAMINATION:  Blood pressure 126/84, pulse 84, normal sinus  rhythm.  GENERAL APPEARANCE:  Normal.  JVP is not elevated.  Carotid pulses are palpable and equal without  bruits.  LUNGS:  Are clear.  CARDIAC:  No murmur.  EXTREMITIES:  Normal.   EKG is normal.   IMPRESSION:  Atypical chest pain with previously normal coronary  angiography and CT angiography.   Symptoms are somewhat concerning because she has been awakened from  sleep with this.   I have suggested the patient have a stress Myoview.  Should she have  recurrent symptoms, I recommended hospitalization.   I plan to see her for followup in a few weeks.     Cecil Cranker, MD, [facility]  Electronically Signed    EJL/MedQ  DD: 03/28/2007  DT: 03/29/2007  Job #: [id number]

## 2011-04-16 NOTE — Cardiovascular Report (Signed)
NAME:  [name], [name]                           ACCOUNT NO.:  [id number]   MEDICAL RECORD NO.:  [id number]                   PATIENT TYPE:  INP   LOCATION:  1824                                 FACILITY:  [facility]   PHYSICIAN:  Charlies Constable, M.D.                  DATE OF BIRTH:  1936/11/27   DATE OF PROCEDURE:  DATE OF DISCHARGE:  04/25/2003                              CARDIAC CATHETERIZATION   CLINICAL HISTORY:  The patient is 75 years old, and has no prior history of  known heart disease, but she does have a positive family history for  coronary heart disease, and recently-diagnosed hyperlipidemia.  She has  developed left-sided chest pain with walking.  Dr. Corinda Gubler did a Cardiolite  scan which showed no evidence of ischemia.  Because of persistent symptoms  which recurred today while she was walking, she was seen in the emergency  room and admitted for evaluation of angiography.   PROCEDURE:  The procedure was performed via the right femoral artery with an  arterial sheath and  6-French preformed coronary catheters.  A front-walled  arterial punch was performed and Omnipaque contrast was used.  A distal  aortogram was performed to rule out abdominal aortic aneurysm.  The right  femoral artery was closed with Perclose at the end of the procedure.  The  patient tolerated the procedure well, and left the laboratory in  satisfactory condition.   RESULTS:  1. Left main coronary artery:  The left main coronary artery is free of     significant disease.  2. Left anterior descending:  The left descending artery gave rise to two     large septal perforators and three diagonal branches.  These and the left     anterior descending proper are free of significant disease.  3. Circumflex artery:  The circumflex artery gave rise to an intermediate     branch, two marginal branches, and a posterolateral branch.  These are     free of significant disease.   RIGHT CORONARY ARTERY:  A  moderate-sized vessel that gave rise to a conus  branch, two right ventricular branches, a posterior descending and a small  posterolateral branch.  These vessels were free of significant disease.   LEFT VENTRICULOGRAM:  The left ventriculogram was performed in the ROA  projection.  It showed good wall motion with no areas of hypokinesis.  The  estimated ejection fraction was 60%.  There was prominent mitral valve  prolapse without regurgitation.   DISTAL AORTOGRAM:  A distal aortogram was performed which showed patent  renal arteries and no significant aortoiliac obstruction.   The aortic pressure was 133/67 with a mean of 96, and left ventricular  pressure was 133/14.    CONCLUSION:  Normal coronary angiography and left ventricular wall motion.   RECOMMENDATION:  Reassurance.  Charlies Constable, M.D.    BB/MEDQ  D:  04/25/2003  T:  04/26/2003  Job:  [id number]   cc:   Cardiopulmonary lab   Georgina Quint. Plotnikov, M.D. [facility]

## 2011-04-16 NOTE — Op Note (Signed)
NAME:  [name], [name] NO.:  [id number]   MEDICAL RECORD NO.:  [id number]          PATIENT TYPE:  AMB   LOCATION:  DSC                          FACILITY:  [facility]   PHYSICIAN:  Etter Sjogren, M.D.     DATE OF BIRTH:  [birth year]   DATE OF PROCEDURE:  08/12/2006  DATE OF DISCHARGE:                                 OPERATIVE REPORT   PREOPERATIVE DIAGNOSES:  1. 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  2. Vascular lesion of the right lower neck.   POSTOPERATIVE DIAGNOSES:  1. 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  2. Vascular lesion of the right lower neck.  3. Complicated open wound, right hip, 2.5 cm.  4. Complicated open wound of the neck, greater than 1.0 cm.   PROCEDURES PERFORMED:  1. Excision of squamous cell carcinoma, greater than 1.0 cm, right hip.  2. Excision of vascular lesion, right lower neck.  3. Complex wound closure, right hip, 2.5 cm.  4. Complex wound collector, right lower neck, greater than 1.0 cm.   SURGEON:  Etter Sjogren, M.D.   ANESTHESIA:  Xylocaine 1% with epinephrine, plus bicarbonate.   CLINICAL NOTE:  A 75 year old woman, who has squamous cell carcinoma,  biopsied by her dermatologist, and referred for a wider excision of this  area.  This is located on the right hip.  She also has a vascular lesion of  the right lower neck that the dermatologist felt should be excised.  The  nature of the procedure and the risks were discussed, and she understood  these and wished to proceed.   DESCRIPTION OF PROCEDURE:  The patient was placed supine.  The area around  the previous biopsy site of the right hip was marked with generous margins  of normal skin and oriented transversely.  The vascular lesion on the right  lower neck was also marked transversely for excision.  The markings having  been placed, Betadine prep, draped with sterile drapes.  Satisfactory local  anesthesia achieved, and the  excisions performed.  A separate scalpel was  used for the lesions.  The wounds were irrigated thoroughly and a layered  closure using 4-0 Monocryl with interrupted, inverted deep sutures and deep  dermal sutures, and a 4-0 Monocryl running subcuticular suture.  Steri-  Strips and a sterile dressing were applied.  Tolerated it well.  Recheck in  the office in 10 days.     Etter Sjogren, M.D.  Electronically Signed    DB/MEDQ  D:  08/12/2006  T:  08/12/2006  Job:  [id number]

## 2011-04-16 NOTE — Assessment & Plan Note (Signed)
[facility]                            CARDIOLOGY OFFICE NOTE   NAME:[name], [name]                          MRN:          [id number]  DATE:12/15/2006                            DOB:          November 03, 1936    Ms. [name] is a very pleasant 75 year old white, married female with  strong family history of coronary artery disease and hyperlipidemia as  well as mitral valve prolapse.  In 2004 the patient had chest pain and a  negative Cardiolite.  Because of recurrent pain subsequent coronary  angiography revealed normal coronary arteries.  She was also evaluated  at the [facility] [place]  She is feeling quite well with no cardiac symptoms.  She has been on Vytorin 10/20 with a marked reduction in LDL from 193  down to 63, total from 250 to 135.   OTHER MEDICATIONS:  1. AcipHex.  2. Folic acid.  3. Vitamins.  4. Aspirin 81 mg.   Blood pressure is 102/72, pulse 80, normal sinus rhythm.  GENERAL APPEARANCE:  Normal, JVP not elevated, carotid pumps were  palpable and equal without bruits.  LUNGS:  Clear.  CARDIO:  Reveals a systolic sound but no murmur.  ABDOMINAL:  Unremarkable.  EXTREMITIES:  Normal.   IMPRESSION:  1. Hyperlipidemia responding to Vytorin 10/22.  2. Strong family history of coronary disease.  3. Mitral valve prolapse with mitral regurgitation.   The patient is concerned about the Vytorin and I suggested we change her  to the simvastatin 20.  We will plan to check her lipids and LFTs in 3  months.  I will see her back at that time and then refer her to one of  my associates.  Should note that her EKG today is within normal limits.     Cecil Cranker, MD, [facility]  Electronically Signed    EJL/MedQ  DD: 12/15/2006  DT: 12/15/2006  Job #: [id number]

## 2011-04-16 NOTE — Assessment & Plan Note (Signed)
[facility]                              CARDIOLOGY OFFICE NOTE   NAME:COHENMiliani, Deike                          MRN:          [id number]  DATE:07/14/2006                            DOB:          1936-01-23    HISTORY OF PRESENT ILLNESS:  Ms. Sublette is a pleasant 75 year old white  married female with a strong family history of coronary artery disease and  hyperlipidemia.  In 2004, she had some typical chest pain and a Cardiolite  was negative.  Subsequent coronary angiography revealed normal coronary  arteries and mitral valve prolapse.  She had mitral valve auscultation and 2-  D echocardiogram revealed some thickening in her mitral valve and mitral  valve prolapse.  She had not been seen in 2 years.  She had been feeling  quite well.  The reason for her visit at this time is because of an LDL of  195.  She had been on Vytorin in the past but because of some leg aches not  severe this was discontinued.   MEDICATIONS:  1. Aciphex.  2. Folic acid.  3. Zantac 75 mg.  4. __________ spray.  5. Actonel.   LABORATORY DATA:  Recent lipids reveal a total cholesterol of 250, HDL of  51, triglycerides 914, LDL 193.  Renal profile was normal.  LFTs were  normal.   PHYSICAL EXAMINATION:  VITAL SIGNS:  Blood pressure 113/77, pulse normal  sinus rhythm.  GENERAL:  Normal appearance.  NECK:  Carotid pulses palpable and equal without bruits.  LUNGS:  Clear.  CARDIAC:  Exam is unremarkable.  She has had a click audible in the past.   IMPRESSION:  1. Hyperlipidemia.  2. Strong family history of coronary disease.  3. Mitral valve prolapse with significant mitral regurgitation.   PLAN:  I have suggested that the patient restarted Vytorin at 10/20.  She  may have been on 1/2 tablet a day in the past but I think we will try the  10/20 as the LDL is higher.  Also, she will start aspirin 81 mg.  We will  recheck the lipids and LFTS in 6 weeks.   I should note  that her EKG today is within normal limits.  We will see her  back in 4 months.                              Cecil Cranker, MD, [facility]    EJL/MedQ  DD:  07/14/2006  DT:  07/14/2006  Job #:  [id number]

## 2011-05-05 ENCOUNTER — Other Ambulatory Visit: Payer: Self-pay | Admitting: Internal Medicine

## 2011-05-05 NOTE — Telephone Encounter (Signed)
Ok to Rf? Med is not active 

## 2011-05-06 ENCOUNTER — Telehealth: Payer: Self-pay | Admitting: *Deleted

## 2011-05-06 NOTE — Telephone Encounter (Signed)
rec rf req for Lorazepam 0.5mg  1 po bid # 180. Last filled 01-28-11. Ok to Rf?

## 2011-05-06 NOTE — Telephone Encounter (Signed)
OK to fill this prescription with additional refills x1 Thank you!  

## 2011-05-07 MED ORDER — LORAZEPAM 0.5 MG PO TABS: 0.5000 mg | ORAL_TABLET | Freq: Two times a day (BID) | ORAL | Status: DC

## 2011-05-13 ENCOUNTER — Other Ambulatory Visit: Payer: Self-pay | Admitting: Obstetrics and Gynecology

## 2011-05-13 DIAGNOSIS — Z1231 Encounter for screening mammogram for malignant neoplasm of breast: Secondary | ICD-10-CM

## 2011-06-16 ENCOUNTER — Telehealth: Payer: Self-pay | Admitting: Internal Medicine

## 2011-06-16 ENCOUNTER — Ambulatory Visit
Admission: RE | Admit: 2011-06-16 | Discharge: 2011-06-16 | Disposition: A | Discharge status: Home / Self Care | Payer: Medicare Other | Source: Ambulatory Visit | Attending: Obstetrics and Gynecology | Admitting: Obstetrics and Gynecology

## 2011-06-16 DIAGNOSIS — Z1231 Encounter for screening mammogram for malignant neoplasm of breast: Secondary | ICD-10-CM

## 2011-06-16 NOTE — Telephone Encounter (Signed)
Pt called to set up her appt. And he does not have anything before December.  She is upset that it is this long for an appt.

## 2011-06-29 ENCOUNTER — Telehealth: Payer: Self-pay | Admitting: *Deleted

## 2011-06-29 NOTE — Telephone Encounter (Signed)
Request additional labs to be ordered for 09/07/11 OV: TIBC, T3, T4, Hepatic Panel [in addition to regularly ordered TSH, Vit D, UA, Lipid, CMP, CBC w/diff]

## 2011-06-30 NOTE — Telephone Encounter (Signed)
OK w/me, however, Mx Timberman needs to be aware that some of them may not be covered by [facility] and should sign a waiver. Thx.

## 2011-07-07 NOTE — Telephone Encounter (Signed)
Spoke w/pt's wife will discuss w/DB and call back by early next week w/appts

## 2011-07-07 NOTE — Telephone Encounter (Signed)
Pt very upset about the wait for bensimhon for her and her husband Madie Reno, they were seen, July and June respectively of last year, does not want to wait until January, although they aren't having any problems at this time, upset too that she left a message 7-18 regarding this and was not called back, requesting a call today

## 2011-07-15 NOTE — Telephone Encounter (Signed)
appt sch for her and her husband on 8/28 at 11:30 and 11:45 in HF clinic

## 2011-07-15 NOTE — Telephone Encounter (Signed)
Pt returning to call back to heather regarding appt for her / husband.

## 2011-07-26 DIAGNOSIS — N760 Acute vaginitis: Secondary | ICD-10-CM | POA: Insufficient documentation

## 2011-07-26 DIAGNOSIS — N952 Postmenopausal atrophic vaginitis: Secondary | ICD-10-CM | POA: Insufficient documentation

## 2011-07-27 ENCOUNTER — Ambulatory Visit (HOSPITAL_COMMUNITY)
Admission: RE | Admit: 2011-07-27 | Discharge: 2011-07-27 | Disposition: A | Discharge status: Home / Self Care | Payer: Medicare Other | Source: Ambulatory Visit | Attending: Internal Medicine | Admitting: Internal Medicine

## 2011-07-27 VITALS — BP 142/70 | HR 67

## 2011-07-27 DIAGNOSIS — I059 Rheumatic mitral valve disease, unspecified: Secondary | ICD-10-CM

## 2011-07-27 DIAGNOSIS — I10 Essential (primary) hypertension: Secondary | ICD-10-CM

## 2011-07-27 HISTORY — DX: Essential (primary) hypertension: I10

## 2011-07-27 MED ORDER — AMOXICILLIN 500 MG PO CAPS: ORAL_CAPSULE | ORAL | Status: DC

## 2011-07-27 NOTE — Progress Notes (Signed)
Encounter addended by: Noralee Space, RN on: 07/27/2011  3:08 PM<BR>     Documentation filed: Patient Instructions Section, Orders

## 2011-07-27 NOTE — Assessment & Plan Note (Signed)
Echo reviewed. Very mild MVP/MR. We discussed SBE prophylaxis and updated guidelines however she would like to continue her SBE prophylaxis.

## 2011-07-27 NOTE — Assessment & Plan Note (Signed)
BP slightly up  -likely due to prednisone. Will continue to watch BP at home if remains up we can start low-dose anti-hypertensive.

## 2011-07-27 NOTE — Patient Instructions (Signed)
Your physician wants you to follow-up in:  6 months. You will receive a reminder letter in the mail two months in advance. If you don't receive a letter, please call our office to schedule the follow-up appointment.   

## 2011-07-27 NOTE — Progress Notes (Signed)
HPI:  Lanora is a 75 year old woman with a history of hyperlipidemia, mild mitral valve prolapse with mild mitral regurgitation (echo 12/11).  She has undergone CT angiogram at the [facility] few years ago which was totally normal.    She returns today for routine followup.  She is doing great.  She continues to walk, exercise with a trainer and do Pilates without any chest pain or dyspnea.  She has not had any palpitations. Recently has had shingles for 2.5 weeks and been taking prednisone. BP mildly elevated with prednisone but was well controlled prior.   Lipids from TC 190 TG 75 HDL 70 LDL 105    ROS: All systems negative except as listed in HPI, PMH and Problem List.  Past Medical History  Diagnosis Date  . GERD (gastroesophageal reflux disease)   . Osteoporosis   . Hyperlipidemia   . OA (osteoarthritis)   . Allergic rhinitis   . Baker's cyst     Left-Dr. Aluisio  . Torn meniscus     bilateral  . MVP (mitral valve prolapse)   . Cataract     Dr. Dione Booze  . Overactive bladder   . Atrophic vaginitis   . Fibroid     Current Outpatient Prescriptions  Medication Sig Dispense Refill  . aspirin 81 MG tablet Take 81 mg by mouth daily.        . Calcium Carbonate-Vitamin D (CALCIUM-VITAMIN D) 500-200 MG-UNIT per tablet Take 3 tablets by mouth 2 (two) times daily with a meal.        . Cetirizine HCl (ZYRTEC ALLERGY) 10 MG CAPS Take 1 capsule by mouth daily.        . Cholecalciferol (VITAMIN D) 400 UNITS capsule Take 400 Units by mouth daily.        [name] estradiol (ESTRACE) 0.1 MG/GM vaginal cream Place 1 g vaginally daily.        [name] gabapentin (NEURONTIN) 300 MG capsule Take 300 mg by mouth 3 (three) times daily.        [name] ipratropium (ATROVENT) 0.06 % nasal spray 2 sprays by Nasal route 2 (two) times daily at 10 AM and 5 PM.        . LORazepam (ATIVAN) 0.5 MG tablet TAKE 1 TABLET TWICE DAILYAS NEEDED FOR ANXIETY.  60 tablet  2  . mometasone (NASONEX) 50 MCG/ACT nasal spray Place 2 sprays  into the nose daily as needed.        . predniSONE (DELTASONE) 20 MG tablet Take 20 mg by mouth daily. Tapering dose       . Probiotic Product (ALIGN) 4 MG CAPS Take 1 capsule by mouth daily.        . RABEprazole (ACIPHEX) 20 MG tablet Take 20 mg by mouth daily.        . ranitidine (ZANTAC) 300 MG tablet Take 1 tablet (300 mg total) by mouth at bedtime.  90 tablet  3  . ZOCOR 20 MG tablet TAKE (2) TABLETS DAILY.  180 each  2  . budesonide (RHINOCORT AQUA) 32 MCG/ACT nasal spray 1-2 sprays by Nasal route daily as needed.        . Diclofenac Sodium (PENNSAID) 1.5 % SOLN Place 3-5 drops onto the skin 3 (three) times daily as needed.        . diclofenac sodium (VOLTAREN) 1 % GEL Apply 1 application topically as needed.        [name] LORazepam (ATIVAN) 0.5 MG tablet Take 1 tablet (0.5 mg total) by  mouth 2 (two) times daily.  180 tablet  1  . simethicone (MYLICON) 125 MG chewable tablet Chew 125-250 mg by mouth as needed.           PHYSICAL EXAM: Filed Vitals:   07/27/11 1213  BP: 142/70  Pulse: 67   General:  Well appearing. No resp difficulty HEENT: normal Neck: supple. JVP flat. Carotids 2+ bilaterally; no bruits. No lymphadenopathy or thryomegaly appreciated. Cor: PMI normal. Regular rate & rhythm. No rubs, gallops or murmurs. Lungs: clear Abdomen: soft, nontender, nondistended. No hepatosplenomegaly. No bruits or masses. Good bowel sounds. Extremities: no cyanosis, clubbing, rash, edema Neuro: alert & orientedx3, cranial nerves grossly intact. Moves all 4 extremities w/o difficulty. Affect pleasant.      ASSESSMENT & PLAN:

## 2011-07-28 ENCOUNTER — Telehealth (HOSPITAL_COMMUNITY): Payer: Self-pay | Admitting: *Deleted

## 2011-07-28 NOTE — Telephone Encounter (Signed)
Pt called this AM to report her BP was elevated at 153/80 and she was concerned, Dr Gala Romney is aware and doesn't want to make any changes at this time, pt is aware and continue to monitor will let me know next week if it is still elevated

## 2011-08-04 ENCOUNTER — Encounter: Payer: Self-pay | Admitting: Obstetrics and Gynecology

## 2011-08-04 ENCOUNTER — Other Ambulatory Visit: Payer: Medicare Other

## 2011-08-04 ENCOUNTER — Other Ambulatory Visit: Payer: Self-pay

## 2011-08-04 ENCOUNTER — Ambulatory Visit (INDEPENDENT_AMBULATORY_CARE_PROVIDER_SITE_OTHER): Payer: Medicare Other | Admitting: Obstetrics and Gynecology

## 2011-08-04 VITALS — BP 148/76

## 2011-08-04 DIAGNOSIS — N859 Noninflammatory disorder of uterus, unspecified: Secondary | ICD-10-CM

## 2011-08-04 DIAGNOSIS — D219 Benign neoplasm of connective and other soft tissue, unspecified: Secondary | ICD-10-CM

## 2011-08-04 DIAGNOSIS — N6019 Diffuse cystic mastopathy of unspecified breast: Secondary | ICD-10-CM

## 2011-08-04 DIAGNOSIS — D259 Leiomyoma of uterus, unspecified: Secondary | ICD-10-CM

## 2011-08-04 DIAGNOSIS — N83339 Acquired atrophy of ovary and fallopian tube, unspecified side: Secondary | ICD-10-CM

## 2011-08-04 DIAGNOSIS — D251 Intramural leiomyoma of uterus: Secondary | ICD-10-CM

## 2011-08-04 DIAGNOSIS — N6459 Other signs and symptoms in breast: Secondary | ICD-10-CM

## 2011-08-04 NOTE — Progress Notes (Signed)
The patient came to see me today both for an ultrasound of her breast exam. The reason for the ultrasound was abdominal bloating and persistent urinary frequency. She was given Gala Murdoch last year but has elected not to take it. On ultrasound today she has an anteverted uterus with 2 small myomas. One is 2.5 cm and one is 1.6 cm. They are stable from previous ultrasounds. Her endometrial echo is slightly enlarged with a significant amount of fluid retention within the endometrial cavity probably due to cervical stenosis. There is no intrauterine lesion. She's had no vaginal bleeding. Her right ovary is normal. Her left ovary has a thin wall echo-free cystic area of 7 mm. It is negative for power flow Doppler. Her cul-de-sac is free of fluid.  Breasts were carefully examined both in the sitting and lying position. There are no masses, or skin changes. She is up-to-date on mammograms.  Assessment: 1. Fibroids 2. Urinary frequency 3. Small cyst left ovary 4. Fibrocystic breasts without mass. 5. Slightly elevated blood pressure today.  Plan: 1. Patient reassured about her ultrasound 2. Patient reassured about her breast exam.3. Patient to follow up with her cardiologist regarding her blood pressure. She just finished taking steroids for herpes zoster and that may be the cause of the above.

## 2011-08-17 ENCOUNTER — Other Ambulatory Visit (HOSPITAL_COMMUNITY): Payer: Self-pay | Admitting: Otolaryngology

## 2011-08-17 DIAGNOSIS — E041 Nontoxic single thyroid nodule: Secondary | ICD-10-CM

## 2011-08-24 ENCOUNTER — Ambulatory Visit (HOSPITAL_COMMUNITY)
Admission: RE | Admit: 2011-08-24 | Discharge: 2011-08-24 | Disposition: A | Discharge status: Home / Self Care | Payer: Medicare Other | Source: Ambulatory Visit | Attending: Otolaryngology | Admitting: Otolaryngology

## 2011-08-24 DIAGNOSIS — E041 Nontoxic single thyroid nodule: Secondary | ICD-10-CM

## 2011-08-24 DIAGNOSIS — E049 Nontoxic goiter, unspecified: Secondary | ICD-10-CM | POA: Insufficient documentation

## 2011-09-01 ENCOUNTER — Other Ambulatory Visit (INDEPENDENT_AMBULATORY_CARE_PROVIDER_SITE_OTHER): Payer: Medicare Other

## 2011-09-01 ENCOUNTER — Other Ambulatory Visit: Payer: Self-pay | Admitting: *Deleted

## 2011-09-01 DIAGNOSIS — E039 Hypothyroidism, unspecified: Secondary | ICD-10-CM

## 2011-09-01 DIAGNOSIS — E785 Hyperlipidemia, unspecified: Secondary | ICD-10-CM

## 2011-09-01 DIAGNOSIS — I10 Essential (primary) hypertension: Secondary | ICD-10-CM

## 2011-09-01 LAB — CBC WITH DIFFERENTIAL/PLATELET
Basophils Absolute: 0 10*3/uL (ref 0.0–0.1)
Basophils Relative: 0.8 % (ref 0.0–3.0)
Eosinophils Absolute: 0.2 10*3/uL (ref 0.0–0.7)
Eosinophils Relative: 3.5 % (ref 0.0–5.0)
HCT: 40.1 % (ref 36.0–46.0)
Hemoglobin: 13.4 g/dL (ref 12.0–15.0)
Lymphocytes Relative: 27.3 % (ref 12.0–46.0)
Lymphs Abs: 1.4 10*3/uL (ref 0.7–4.0)
MCHC: 33.4 g/dL (ref 30.0–36.0)
MCV: 95.9 fl (ref 78.0–100.0)
Monocytes Absolute: 0.6 10*3/uL (ref 0.1–1.0)
Monocytes Relative: 12.5 % — ABNORMAL HIGH (ref 3.0–12.0)
Neutro Abs: 2.8 10*3/uL (ref 1.4–7.7)
Neutrophils Relative %: 55.9 % (ref 43.0–77.0)
Platelets: 256 10*3/uL (ref 150.0–400.0)
RBC: 4.18 Mil/uL (ref 3.87–5.11)
RDW: 15 % — ABNORMAL HIGH (ref 11.5–14.6)
WBC: 5 10*3/uL (ref 4.5–10.5)

## 2011-09-01 LAB — BASIC METABOLIC PANEL
BUN: 18 mg/dL (ref 6–23)
CO2: 27 mEq/L (ref 19–32)
Calcium: 8.9 mg/dL (ref 8.4–10.5)
Chloride: 107 mEq/L (ref 96–112)
Creatinine, Ser: 0.7 mg/dL (ref 0.4–1.2)
GFR: 83.02 mL/min (ref >60.00)
Glucose, Bld: 103 mg/dL — ABNORMAL HIGH (ref 70–99)
Potassium: 4 mEq/L (ref 3.5–5.1)
Sodium: 141 mEq/L (ref 135–145)

## 2011-09-01 LAB — LIPID PANEL
Cholesterol: 157 mg/dL (ref 0–200)
HDL: 48.3 mg/dL (ref >39.00)
LDL Cholesterol: 85 mg/dL (ref 0–99)
Total CHOL/HDL Ratio: 3
Triglycerides: 118 mg/dL (ref 0.0–149.0)
VLDL: 23.6 mg/dL (ref 0.0–40.0)

## 2011-09-01 LAB — TSH: TSH: 3.6 u[IU]/mL (ref 0.35–5.50)

## 2011-09-02 ENCOUNTER — Encounter: Payer: Self-pay | Admitting: Obstetrics and Gynecology

## 2011-09-07 ENCOUNTER — Ambulatory Visit (INDEPENDENT_AMBULATORY_CARE_PROVIDER_SITE_OTHER): Payer: Medicare Other | Admitting: Internal Medicine

## 2011-09-07 ENCOUNTER — Encounter: Payer: Self-pay | Admitting: Internal Medicine

## 2011-09-07 DIAGNOSIS — E039 Hypothyroidism, unspecified: Secondary | ICD-10-CM

## 2011-09-07 DIAGNOSIS — Z0000 Encounter for general adult medical examination without abnormal findings: Secondary | ICD-10-CM

## 2011-09-07 DIAGNOSIS — K219 Gastro-esophageal reflux disease without esophagitis: Secondary | ICD-10-CM

## 2011-09-07 DIAGNOSIS — B029 Zoster without complications: Secondary | ICD-10-CM

## 2011-09-07 DIAGNOSIS — E782 Mixed hyperlipidemia: Secondary | ICD-10-CM

## 2011-09-07 DIAGNOSIS — M810 Age-related osteoporosis without current pathological fracture: Secondary | ICD-10-CM

## 2011-09-07 DIAGNOSIS — I10 Essential (primary) hypertension: Secondary | ICD-10-CM

## 2011-09-07 HISTORY — DX: Zoster without complications: B02.9

## 2011-09-07 MED ORDER — BUDESONIDE 32 MCG/ACT NA SUSP: 1.0000 | Freq: Every day | NASAL | Status: DC | PRN

## 2011-09-07 MED ORDER — AZITHROMYCIN 250 MG PO TABS: ORAL_TABLET | ORAL | Status: AC

## 2011-09-07 MED ORDER — RABEPRAZOLE SODIUM 20 MG PO TBEC: 20.0000 mg | DELAYED_RELEASE_TABLET | Freq: Every day | ORAL | Status: DC

## 2011-09-07 MED ORDER — AZELASTINE HCL 0.15 % NA SOLN: 1.0000 | NASAL | Status: DC

## 2011-09-07 NOTE — Assessment & Plan Note (Signed)
Continue with current prescription therapy as reflected on the Med list.  

## 2011-09-07 NOTE — Progress Notes (Signed)
  Subjective:    Patient ID: [name], female    DOB: [birth year], 75 y.o.   MRN: [id number]  HPI  The patient presents for a follow-up of  chronic hypertension, chronic dyslipidemia, type 2 diabetes controlled with medicines    Review of Systems  Constitutional: Negative for chills, activity change, appetite change, fatigue and unexpected weight change.  HENT: Negative for congestion, mouth sores and sinus pressure.   Eyes: Negative for visual disturbance.  Respiratory: Negative for cough and chest tightness.   Gastrointestinal: Negative for nausea and abdominal pain.  Genitourinary: Negative for frequency, difficulty urinating and vaginal pain.  Musculoskeletal: Negative for back pain and gait problem.  Skin: Negative for pallor and rash.  Neurological: Negative for dizziness, tremors, weakness, numbness and headaches.  Psychiatric/Behavioral: Negative for confusion and sleep disturbance.       Objective:   Physical Exam  Constitutional: She appears well-developed and well-nourished. No distress.  HENT:  Head: Normocephalic.  Right Ear: External ear normal.  Left Ear: External ear normal.  Nose: Nose normal.  Mouth/Throat: Oropharynx is clear and moist.  Eyes: Conjunctivae are normal. Pupils are equal, round, and reactive to light. Right eye exhibits no discharge. Left eye exhibits no discharge.  Neck: Normal range of motion. Neck supple. No JVD present. No tracheal deviation present. No thyromegaly present.  Cardiovascular: Normal rate, regular rhythm and normal heart sounds.   Pulmonary/Chest: No stridor. No respiratory distress. She has no wheezes.  Abdominal: Soft. Bowel sounds are normal. She exhibits no distension and no mass. There is no tenderness. There is no rebound and no guarding.  Musculoskeletal: She exhibits no edema and no tenderness.  Lymphadenopathy:    She has no cervical adenopathy.  Neurological: She displays normal reflexes. No cranial nerve deficit.  She exhibits normal muscle tone. Coordination normal.  Skin: No rash noted. No erythema.  Psychiatric: She has a normal mood and affect. Her behavior is normal. Judgment and thought content normal.     Lab Results  Component Value Date   WBC 5.0 09/01/2011   HGB 13.4 09/01/2011   HCT 40.1 09/01/2011   PLT 256.0 09/01/2011   GLUCOSE 103* 09/01/2011   CHOL 157 09/01/2011   TRIG 118.0 09/01/2011   HDL 48.30 09/01/2011   LDLCALC 85 09/01/2011   ALT 29 03/16/2011   AST 27 03/16/2011   NA 141 09/01/2011   K 4.0 09/01/2011   CL 107 09/01/2011   CREATININE 0.7 09/01/2011   BUN 18 09/01/2011   CO2 27 09/01/2011   TSH 3.60 09/01/2011   HGBA1C 6.0 12/05/2006       Assessment & Plan:

## 2011-09-07 NOTE — Assessment & Plan Note (Signed)
On Vit D 

## 2011-09-07 NOTE — Assessment & Plan Note (Signed)
Zoster was treated

## 2011-09-08 ENCOUNTER — Ambulatory Visit: Payer: Medicare Other | Admitting: Internal Medicine

## 2011-10-01 ENCOUNTER — Encounter (INDEPENDENT_AMBULATORY_CARE_PROVIDER_SITE_OTHER): Payer: Self-pay | Admitting: Surgery

## 2011-10-04 ENCOUNTER — Encounter (INDEPENDENT_AMBULATORY_CARE_PROVIDER_SITE_OTHER): Payer: Self-pay | Admitting: Surgery

## 2011-10-04 ENCOUNTER — Ambulatory Visit (INDEPENDENT_AMBULATORY_CARE_PROVIDER_SITE_OTHER): Payer: Medicare Other | Admitting: Surgery

## 2011-10-04 VITALS — BP 114/82 | HR 76 | Temp 99.0°F | Resp 16 | Ht 62.5 in | Wt 130.6 lb

## 2011-10-04 DIAGNOSIS — E042 Nontoxic multinodular goiter: Secondary | ICD-10-CM

## 2011-10-04 HISTORY — DX: Nontoxic multinodular goiter: E04.2

## 2011-10-04 MED ORDER — SYNTHROID 50 MCG PO TABS: 50.0000 ug | ORAL_TABLET | Freq: Every day | ORAL | Status: DC

## 2011-10-04 NOTE — Progress Notes (Signed)
Chief Complaint  Patient presents with  . Thyroid Nodule    bilateral thyroid nodules - referral by Dr. Ermalinda Barrios    HISTORY: Patient is a 75 year old white female referred by her ENT for newly diagnosed thyroid nodules. Patient was found on physical exam to have a small thyroid nodule in September 2012. She underwent thyroid ultrasound showing a normal-sized thyroid gland with the right lobe measuring 3.8 cm and left lobe measuring 3.0 cm. The thyroid parenchyma was inhomogeneous. 3 nodules were noted with the largest being in the right superior pole measuring 10 mm in size. Recent thyroid function tests show a TSH level of 3.60. Patient has never been on thyroid medication. She has never had any prior head or neck surgery.  There is no family history of thyroid disease and specifically no history of thyroid cancer. There is no family history of other endocrine operative.  Patient does note significant fatigue recently. She is also noted mild depression.   Past Medical History  Diagnosis Date  . GERD (gastroesophageal reflux disease)   . Osteoporosis   . Hyperlipidemia   . OA (osteoarthritis)   . Allergic rhinitis   . Baker's cyst     Left-Dr. Aluisio  . Torn meniscus     bilateral  . MVP (mitral valve prolapse)   . Cataract     Dr. Dione Booze  . Overactive bladder   . Atrophic vaginitis   . Fibroid   . Thyroid nodule   . Heart murmur      Current Outpatient Prescriptions  Medication Sig Dispense Refill  . amoxicillin (AMOXIL) 500 MG capsule Take 4 tabs 1 hour prior to dental work  4 capsule  6  . aspirin 81 MG tablet Take 81 mg by mouth daily.        . Azelastine HCl (ASTEPRO) 0.15 % SOLN Place 1 Act into the nose 1 day or 1 dose.  30 mL  11  . budesonide (RHINOCORT AQUA) 32 MCG/ACT nasal spray Place 1-2 sprays into the nose daily as needed.  1 Bottle  11  . Calcium Carbonate-Vitamin D (CALCIUM-VITAMIN D) 500-200 MG-UNIT per tablet Take 3 tablets by mouth 2 (two) times daily  with a meal.        . Cetirizine HCl (ZYRTEC ALLERGY) 10 MG CAPS Take 1 capsule by mouth daily.        . Cholecalciferol (VITAMIN D) 400 UNITS capsule Take 400 Units by mouth daily.        . diclofenac sodium (VOLTAREN) 1 % GEL Apply 1 application topically as needed.        [name] estradiol (ESTRACE) 0.1 MG/GM vaginal cream Place 1 g vaginally daily.        [name] ipratropium (ATROVENT) 0.06 % nasal spray 2 sprays by Nasal route 2 (two) times daily at 10 AM and 5 PM.        . LORazepam (ATIVAN) 0.5 MG tablet Take 1 tablet (0.5 mg total) by mouth 2 (two) times daily.  180 tablet  1  . Probiotic Product (ALIGN) 4 MG CAPS Take 1 capsule by mouth daily.        . RABEprazole (ACIPHEX) 20 MG tablet Take 1 tablet (20 mg total) by mouth daily.  90 tablet  3  . ranitidine (ZANTAC) 300 MG tablet Take 1 tablet (300 mg total) by mouth at bedtime.  90 tablet  3  . ZOCOR 20 MG tablet TAKE (2) TABLETS DAILY.  180 each  2  . SYNTHROID  50 MCG tablet Take 1 tablet (50 mcg total) by mouth daily.  30 tablet  6     Allergies  Allergen Reactions  . Omeprazole     REACTION: did not work     Family History  Problem Relation Age of Onset  . Heart disease Mother   . Hypertension Mother   . Osteoporosis Mother   . Cancer Father     Pancreatic  . Coronary artery disease Other      History   Social History  . Marital Status: Married    Spouse Name: N/A    Number of Children: N/A  . Years of Education: N/A   Occupational History  . retired    Social History Main Topics  . Smoking status: Former Games developer  . Smokeless tobacco: None  . Alcohol Use: Yes  . Drug Use: No  . Sexually Active: Yes    Birth Control/ Protection: Post-menopausal   Other Topics Concern  . None   Social History Narrative   Regular exercise-yesDaily caffeine use     REVIEW OF SYSTEMS - PERTINENT POSITIVES ONLY: Denies tremors. Denies palpitations. Does note moderate fatigue. Does note mild depression.   EXAM: Filed Vitals:     10/04/11 1347  BP: 114/82  Pulse: 76  Temp: 99 F (37.2 C)  Resp: 16    HEENT: normocephalic; pupils equal and reactive; sclerae clear; dentition good; mucous membranes moist NECK:  Slightly nodular right thyroid lobe without discrete or dominant mass; isthmus is normal; left lobe without dominant nodule; symmetric on extension; no palpable anterior or posterior cervical lymphadenopathy; no supraclavicular masses; no tenderness CHEST: clear to auscultation bilaterally without rales, rhonchi, or wheezes CARDIAC: regular rate and rhythm without significant murmur; peripheral pulses are full EXT:  non-tender without edema; no deformity NEURO: no gross focal deficits; no sign of tremor   LABORATORY RESULTS: See E-Chart for most recent results   RADIOLOGY RESULTS: See E-Chart or I-Site for most recent results   IMPRESSION: #1 bilateral small thyroid nodules without worrisome features #2 borderline hypothyroidism #3 probable Hashimoto's thyroiditis   PLAN: The patient and I discussed all of the above findings. I think she would benefit from low dose thyroid hormone replacement as treatment for her underlying hypothyroidism and has suppression for her bilateral thyroid nodules. At this point her nodules are small and without any worrisome findings and do not require fine needle aspiration biopsy. She does require observation and we will repeat a thyroid ultrasound in 6 months.  I'm going to prescribe Synthroid 50 mcg daily. We will check her TSH level in 6 weeks.  The patient will return for followup in 6 months.   Velora Heckler, MD, FACS General & Endocrine Surgery [facility]      Visit Diagnoses: 1. Multinodular goiter (nontoxic)     Primary Care Physician: Sonda Primes, MD, MD

## 2011-11-10 ENCOUNTER — Other Ambulatory Visit (INDEPENDENT_AMBULATORY_CARE_PROVIDER_SITE_OTHER): Payer: Self-pay | Admitting: Surgery

## 2011-11-11 LAB — TSH: TSH: 2.631 u[IU]/mL (ref 0.350–4.500)

## 2011-12-15 ENCOUNTER — Other Ambulatory Visit: Payer: Self-pay | Admitting: Internal Medicine

## 2011-12-15 ENCOUNTER — Telehealth (HOSPITAL_COMMUNITY): Payer: Self-pay | Admitting: *Deleted

## 2011-12-15 NOTE — Telephone Encounter (Signed)
[name] requested a cholesterol level be put in for her for the first week of Feburary.  She has other blood work that she will be doing, she wants to be sure that her cholesterol is checked as well. Thanks!

## 2011-12-15 NOTE — Telephone Encounter (Signed)
Spoke w/pt Dr Posey Rea has put in for her labs including lipid, she will have done

## 2011-12-31 ENCOUNTER — Other Ambulatory Visit: Payer: Self-pay | Admitting: *Deleted

## 2011-12-31 MED ORDER — SIMVASTATIN 20 MG PO TABS: 40.0000 mg | ORAL_TABLET | Freq: Every day | ORAL | Status: DC

## 2012-01-07 ENCOUNTER — Telehealth: Payer: Self-pay | Admitting: Internal Medicine

## 2012-01-07 ENCOUNTER — Other Ambulatory Visit (INDEPENDENT_AMBULATORY_CARE_PROVIDER_SITE_OTHER): Payer: Medicare Other

## 2012-01-07 DIAGNOSIS — E042 Nontoxic multinodular goiter: Secondary | ICD-10-CM

## 2012-01-07 DIAGNOSIS — Z0000 Encounter for general adult medical examination without abnormal findings: Secondary | ICD-10-CM | POA: Diagnosis not present

## 2012-01-07 DIAGNOSIS — E559 Vitamin D deficiency, unspecified: Secondary | ICD-10-CM | POA: Diagnosis not present

## 2012-01-07 LAB — HEPATIC FUNCTION PANEL
ALT: 22 U/L (ref 0–35)
AST: 23 U/L (ref 0–37)
Albumin: 3.7 g/dL (ref 3.5–5.2)
Alkaline Phosphatase: 36 U/L — ABNORMAL LOW (ref 39–117)
Bilirubin, Direct: 0.1 mg/dL (ref 0.0–0.3)
Total Bilirubin: 0.6 mg/dL (ref 0.3–1.2)
Total Protein: 7 g/dL (ref 6.0–8.3)

## 2012-01-07 LAB — CBC WITH DIFFERENTIAL/PLATELET
Basophils Absolute: 0 10*3/uL (ref 0.0–0.1)
Basophils Relative: 0.5 % (ref 0.0–3.0)
Eosinophils Absolute: 0.2 10*3/uL (ref 0.0–0.7)
Eosinophils Relative: 4.1 % (ref 0.0–5.0)
HCT: 39.7 % (ref 36.0–46.0)
Hemoglobin: 13.6 g/dL (ref 12.0–15.0)
Lymphocytes Relative: 26.2 % (ref 12.0–46.0)
Lymphs Abs: 1.5 10*3/uL (ref 0.7–4.0)
MCHC: 34.3 g/dL (ref 30.0–36.0)
MCV: 91.7 fl (ref 78.0–100.0)
Monocytes Absolute: 0.6 10*3/uL (ref 0.1–1.0)
Monocytes Relative: 10.9 % (ref 3.0–12.0)
Neutro Abs: 3.3 10*3/uL (ref 1.4–7.7)
Neutrophils Relative %: 58.3 % (ref 43.0–77.0)
Platelets: 250 10*3/uL (ref 150.0–400.0)
RBC: 4.33 Mil/uL (ref 3.87–5.11)
RDW: 13.5 % (ref 11.5–14.6)
WBC: 5.7 10*3/uL (ref 4.5–10.5)

## 2012-01-07 LAB — BASIC METABOLIC PANEL
BUN: 18 mg/dL (ref 6–23)
CO2: 29 mEq/L (ref 19–32)
Calcium: 9.1 mg/dL (ref 8.4–10.5)
Chloride: 104 mEq/L (ref 96–112)
Creatinine, Ser: 0.7 mg/dL (ref 0.4–1.2)
GFR: 82.94 mL/min (ref >60.00)
Glucose, Bld: 101 mg/dL — ABNORMAL HIGH (ref 70–99)
Potassium: 4.1 mEq/L (ref 3.5–5.1)
Sodium: 139 mEq/L (ref 135–145)

## 2012-01-07 LAB — URINALYSIS
Bilirubin Urine: NEGATIVE
Hgb urine dipstick: NEGATIVE
Ketones, ur: NEGATIVE
Leukocytes, UA: NEGATIVE
Nitrite: NEGATIVE
Specific Gravity, Urine: 1.02 (ref 1.000–1.030)
Total Protein, Urine: NEGATIVE
Urine Glucose: NEGATIVE
Urobilinogen, UA: 0.2 (ref 0.0–1.0)
pH: 6 (ref 5.0–8.0)

## 2012-01-07 LAB — LIPID PANEL
Cholesterol: 169 mg/dL (ref 0–200)
HDL: 51.6 mg/dL (ref >39.00)
LDL Cholesterol: 97 mg/dL (ref 0–99)
Total CHOL/HDL Ratio: 3
Triglycerides: 104 mg/dL (ref 0.0–149.0)
VLDL: 20.8 mg/dL (ref 0.0–40.0)

## 2012-01-07 LAB — TSH: TSH: 3.65 u[IU]/mL (ref 0.35–5.50)

## 2012-01-07 NOTE — Telephone Encounter (Signed)
OK to fill this prescription with additional refills x3 Thank you!  

## 2012-01-07 NOTE — Telephone Encounter (Signed)
Pt req rx Lorazepam 0.5mg 

## 2012-01-08 LAB — VITAMIN D 25 HYDROXY (VIT D DEFICIENCY, FRACTURES): Vit D, 25-Hydroxy: 45 ng/mL (ref 30–89)

## 2012-01-10 MED ORDER — LORAZEPAM 0.5 MG PO TABS: 0.5000 mg | ORAL_TABLET | Freq: Two times a day (BID) | ORAL | Status: DC

## 2012-01-25 ENCOUNTER — Other Ambulatory Visit: Payer: Self-pay | Admitting: Internal Medicine

## 2012-02-02 DIAGNOSIS — M779 Enthesopathy, unspecified: Secondary | ICD-10-CM | POA: Diagnosis not present

## 2012-02-02 DIAGNOSIS — L84 Corns and callosities: Secondary | ICD-10-CM | POA: Diagnosis not present

## 2012-02-03 ENCOUNTER — Ambulatory Visit (HOSPITAL_COMMUNITY)
Admission: RE | Admit: 2012-02-03 | Discharge: 2012-02-03 | Disposition: A | Discharge status: Home / Self Care | Payer: Medicare Other | Source: Ambulatory Visit | Attending: Internal Medicine | Admitting: Internal Medicine

## 2012-02-03 VITALS — BP 106/64 | HR 75

## 2012-02-03 DIAGNOSIS — I059 Rheumatic mitral valve disease, unspecified: Secondary | ICD-10-CM | POA: Insufficient documentation

## 2012-02-03 DIAGNOSIS — M810 Age-related osteoporosis without current pathological fracture: Secondary | ICD-10-CM | POA: Insufficient documentation

## 2012-02-03 DIAGNOSIS — E782 Mixed hyperlipidemia: Secondary | ICD-10-CM | POA: Diagnosis not present

## 2012-02-03 DIAGNOSIS — M1990 Unspecified osteoarthritis, unspecified site: Secondary | ICD-10-CM | POA: Diagnosis not present

## 2012-02-03 DIAGNOSIS — Z79899 Other long term (current) drug therapy: Secondary | ICD-10-CM | POA: Diagnosis not present

## 2012-02-03 DIAGNOSIS — Z7982 Long term (current) use of aspirin: Secondary | ICD-10-CM | POA: Insufficient documentation

## 2012-02-03 DIAGNOSIS — I10 Essential (primary) hypertension: Secondary | ICD-10-CM | POA: Insufficient documentation

## 2012-02-03 NOTE — Assessment & Plan Note (Signed)
Looks good. Continue simvastatin. Can stop ASA.

## 2012-02-03 NOTE — Assessment & Plan Note (Signed)
Blood pressure well controlled. Continue current regimen.  

## 2012-02-03 NOTE — Assessment & Plan Note (Signed)
Trivial murmur on exam. Repeat echo next year.

## 2012-02-03 NOTE — Progress Notes (Signed)
HPI:  [name] is a 76 year old woman with a history of hyperlipidemia, mild mitral valve prolapse with mild mitral regurgitation (echo 12/11).  She has undergone CT angiogram at the [facility] few years ago which was totally normal.    She returns today for routine followup.  She is doing great.  She continues to walk, exercise with a trainer and do Pilates without any chest pain or dyspnea.  She has not had any palpitations. BP well controlled. Having pain in low back. Bruises a lot with ASA.   Lab Results  Component Value Date   CHOL 169 01/07/2012   HDL 51.60 01/07/2012   LDLCALC 97 01/07/2012   TRIG 104.0 01/07/2012   CHOLHDL 3 01/07/2012    ROS: All systems negative except as listed in HPI, PMH and Problem List.  Past Medical History  Diagnosis Date  . GERD (gastroesophageal reflux disease)   . Osteoporosis   . Hyperlipidemia   . OA (osteoarthritis)   . Allergic rhinitis   . Baker's cyst     Left-Dr. Aluisio  . Torn meniscus     bilateral  . MVP (mitral valve prolapse)   . Cataract     Dr. Dione Booze  . Overactive bladder   . Atrophic vaginitis   . Fibroid   . Thyroid nodule   . Heart murmur     Current Outpatient Prescriptions  Medication Sig Dispense Refill  . ACIPHEX 20 MG tablet TAKE 1 TABLET ONCE DAILY.  90 each  3  . amoxicillin (AMOXIL) 500 MG capsule Take 4 tabs 1 hour prior to dental work  4 capsule  6  . aspirin 81 MG tablet Take 81 mg by mouth daily.        . Azelastine HCl (ASTEPRO) 0.15 % SOLN Place 1 Act into the nose 1 day or 1 dose.  30 mL  11  . budesonide (RHINOCORT AQUA) 32 MCG/ACT nasal spray Place 1-2 sprays into the nose daily as needed.  1 Bottle  11  . Calcium Carbonate-Vitamin D (CALCIUM-VITAMIN D) 500-200 MG-UNIT per tablet Take 3 tablets by mouth 2 (two) times daily with a meal.        . Cetirizine HCl (ZYRTEC ALLERGY) 10 MG CAPS Take 1 capsule by mouth daily.        . Cholecalciferol (VITAMIN D) 400 UNITS capsule Take 400 Units by mouth daily.        .  diclofenac sodium (VOLTAREN) 1 % GEL Apply 1 application topically as needed.        [name] estradiol (ESTRACE) 0.1 MG/GM vaginal cream Place 1 g vaginally daily.        [name] ipratropium (ATROVENT) 0.06 % nasal spray Place 2 sprays into the nose 2 (two) times daily.  45 mL  1  . LORazepam (ATIVAN) 0.5 MG tablet Take 1 tablet (0.5 mg total) by mouth 2 (two) times daily.  180 tablet  3  . Probiotic Product (ALIGN) 4 MG CAPS Take 1 capsule by mouth daily.        . ranitidine (ZANTAC) 300 MG tablet Take 1 tablet (300 mg total) by mouth at bedtime.  90 tablet  3  . simvastatin (ZOCOR) 20 MG tablet Take 2 tablets (40 mg total) by mouth daily.  180 tablet  3  . SYNTHROID 50 MCG tablet Take 1 tablet (50 mcg total) by mouth daily.  30 tablet  6     PHYSICAL EXAM: Filed Vitals:   02/03/12 1148  BP: 106/64  Pulse: 75   General:  Well appearing. No resp difficulty HEENT: normal Neck: supple. JVP flat. Carotids 2+ bilaterally; no bruits. No lymphadenopathy or thryomegaly appreciated. Cor: PMI normal. Regular rate & rhythm. No rubs, gallops. Trivial murmur at apex.  Lungs: clear Abdomen: soft, nontender, nondistended. No hepatosplenomegaly. No bruits or masses. Good bowel sounds. Extremities: no cyanosis, clubbing, rash, edema Neuro: alert & orientedx3, cranial nerves grossly intact. Moves all 4 extremities w/o difficulty. Affect pleasant.      ASSESSMENT & PLAN:

## 2012-02-03 NOTE — Patient Instructions (Signed)
Your physician has requested that you have an echocardiogram. Echocardiography is a painless test that uses sound waves to create images of your heart. It provides your doctor with information about the size and shape of your heart and how well your heart's chambers and valves are working. This procedure takes approximately one hour. There are no restrictions for this procedure.  NEEDS IN 1 YEAR  We will contact you in 1 year to schedule your next appointment.

## 2012-02-04 ENCOUNTER — Telehealth: Payer: Self-pay | Admitting: *Deleted

## 2012-02-04 MED ORDER — IPRATROPIUM BROMIDE 0.06 % NA SOLN: 2.0000 | Freq: Two times a day (BID) | NASAL | Status: DC

## 2012-02-04 NOTE — Telephone Encounter (Signed)
Rec fax from pharmacy stating pt has been using Atrovent nasal spray- 2 prays in a nostril tid instead of bid. She would like a Rf for this sig. Please advise.

## 2012-02-04 NOTE — Telephone Encounter (Signed)
Patient informed. 

## 2012-02-04 NOTE — Telephone Encounter (Signed)
Done escript 

## 2012-03-01 ENCOUNTER — Other Ambulatory Visit (INDEPENDENT_AMBULATORY_CARE_PROVIDER_SITE_OTHER): Payer: Medicare Other

## 2012-03-01 ENCOUNTER — Other Ambulatory Visit: Payer: Self-pay | Admitting: Internal Medicine

## 2012-03-01 DIAGNOSIS — M1990 Unspecified osteoarthritis, unspecified site: Secondary | ICD-10-CM

## 2012-03-01 DIAGNOSIS — E785 Hyperlipidemia, unspecified: Secondary | ICD-10-CM

## 2012-03-01 DIAGNOSIS — E039 Hypothyroidism, unspecified: Secondary | ICD-10-CM

## 2012-03-01 DIAGNOSIS — I10 Essential (primary) hypertension: Secondary | ICD-10-CM

## 2012-03-01 DIAGNOSIS — H04129 Dry eye syndrome of unspecified lacrimal gland: Secondary | ICD-10-CM | POA: Diagnosis not present

## 2012-03-01 DIAGNOSIS — E559 Vitamin D deficiency, unspecified: Secondary | ICD-10-CM | POA: Diagnosis not present

## 2012-03-01 DIAGNOSIS — E782 Mixed hyperlipidemia: Secondary | ICD-10-CM | POA: Diagnosis not present

## 2012-03-01 DIAGNOSIS — Z0000 Encounter for general adult medical examination without abnormal findings: Secondary | ICD-10-CM

## 2012-03-01 DIAGNOSIS — H5000 Unspecified esotropia: Secondary | ICD-10-CM | POA: Diagnosis not present

## 2012-03-01 DIAGNOSIS — Z961 Presence of intraocular lens: Secondary | ICD-10-CM | POA: Diagnosis not present

## 2012-03-01 LAB — HEPATIC FUNCTION PANEL
ALT: 23 U/L (ref 0–35)
AST: 27 U/L (ref 0–37)
Albumin: 4.1 g/dL (ref 3.5–5.2)
Alkaline Phosphatase: 41 U/L (ref 39–117)
Bilirubin, Direct: 0 mg/dL (ref 0.0–0.3)
Total Bilirubin: 0.5 mg/dL (ref 0.3–1.2)
Total Protein: 7.7 g/dL (ref 6.0–8.3)

## 2012-03-01 LAB — CBC WITH DIFFERENTIAL/PLATELET
Basophils Absolute: 0 10*3/uL (ref 0.0–0.1)
Basophils Relative: 0.8 % (ref 0.0–3.0)
Eosinophils Absolute: 0.2 10*3/uL (ref 0.0–0.7)
Eosinophils Relative: 2.6 % (ref 0.0–5.0)
HCT: 40.3 % (ref 36.0–46.0)
Hemoglobin: 13.6 g/dL (ref 12.0–15.0)
Lymphocytes Relative: 23.7 % (ref 12.0–46.0)
Lymphs Abs: 1.4 10*3/uL (ref 0.7–4.0)
MCHC: 33.7 g/dL (ref 30.0–36.0)
MCV: 93.9 fl (ref 78.0–100.0)
Monocytes Absolute: 0.6 10*3/uL (ref 0.1–1.0)
Monocytes Relative: 9.6 % (ref 3.0–12.0)
Neutro Abs: 3.7 10*3/uL (ref 1.4–7.7)
Neutrophils Relative %: 63.3 % (ref 43.0–77.0)
Platelets: 245 10*3/uL (ref 150.0–400.0)
RBC: 4.29 Mil/uL (ref 3.87–5.11)
RDW: 14.2 % (ref 11.5–14.6)
WBC: 5.9 10*3/uL (ref 4.5–10.5)

## 2012-03-01 LAB — BASIC METABOLIC PANEL
BUN: 20 mg/dL (ref 6–23)
CO2: 26 mEq/L (ref 19–32)
Calcium: 9.5 mg/dL (ref 8.4–10.5)
Chloride: 100 mEq/L (ref 96–112)
Creatinine, Ser: 0.7 mg/dL (ref 0.4–1.2)
GFR: 81.62 mL/min (ref >60.00)
Glucose, Bld: 85 mg/dL (ref 70–99)
Potassium: 5.1 mEq/L (ref 3.5–5.1)
Sodium: 139 mEq/L (ref 135–145)

## 2012-03-01 LAB — LIPID PANEL
Cholesterol: 177 mg/dL (ref 0–200)
HDL: 58.9 mg/dL (ref >39.00)
LDL Cholesterol: 99 mg/dL (ref 0–99)
Total CHOL/HDL Ratio: 3
Triglycerides: 96 mg/dL (ref 0.0–149.0)
VLDL: 19.2 mg/dL (ref 0.0–40.0)

## 2012-03-01 LAB — TSH: TSH: 2.28 u[IU]/mL (ref 0.35–5.50)

## 2012-03-02 LAB — VITAMIN D 25 HYDROXY (VIT D DEFICIENCY, FRACTURES): Vit D, 25-Hydroxy: 51 ng/mL (ref 30–89)

## 2012-03-03 ENCOUNTER — Telehealth: Payer: Self-pay | Admitting: Internal Medicine

## 2012-03-03 NOTE — Telephone Encounter (Signed)
Stacey, please, inform patient that all labs are normal   Please, mail the labs to the patient.    Thx  

## 2012-03-03 NOTE — Telephone Encounter (Signed)
Left detailed mess informing pt- copies mailed

## 2012-03-06 DIAGNOSIS — L84 Corns and callosities: Secondary | ICD-10-CM | POA: Diagnosis not present

## 2012-03-08 ENCOUNTER — Encounter: Payer: Self-pay | Admitting: Obstetrics and Gynecology

## 2012-03-08 ENCOUNTER — Ambulatory Visit (INDEPENDENT_AMBULATORY_CARE_PROVIDER_SITE_OTHER): Payer: Medicare Other | Admitting: Obstetrics and Gynecology

## 2012-03-08 ENCOUNTER — Ambulatory Visit (INDEPENDENT_AMBULATORY_CARE_PROVIDER_SITE_OTHER)
Admission: RE | Admit: 2012-03-08 | Discharge: 2012-03-08 | Disposition: A | Discharge status: Home / Self Care | Payer: Medicare Other | Source: Ambulatory Visit | Attending: Internal Medicine | Admitting: Internal Medicine

## 2012-03-08 ENCOUNTER — Ambulatory Visit (INDEPENDENT_AMBULATORY_CARE_PROVIDER_SITE_OTHER): Payer: Medicare Other | Admitting: Internal Medicine

## 2012-03-08 ENCOUNTER — Other Ambulatory Visit (HOSPITAL_COMMUNITY)
Admission: RE | Admit: 2012-03-08 | Discharge: 2012-03-08 | Disposition: A | Discharge status: Home / Self Care | Payer: Medicare Other | Source: Ambulatory Visit | Attending: Obstetrics and Gynecology | Admitting: Obstetrics and Gynecology

## 2012-03-08 ENCOUNTER — Encounter: Payer: Self-pay | Admitting: Internal Medicine

## 2012-03-08 VITALS — BP 98/68 | HR 80 | Temp 98.1°F | Resp 16

## 2012-03-08 VITALS — BP 112/60 | Ht 61.0 in | Wt 121.0 lb

## 2012-03-08 DIAGNOSIS — Z124 Encounter for screening for malignant neoplasm of cervix: Secondary | ICD-10-CM

## 2012-03-08 DIAGNOSIS — M549 Dorsalgia, unspecified: Secondary | ICD-10-CM

## 2012-03-08 DIAGNOSIS — R198 Other specified symptoms and signs involving the digestive system and abdomen: Secondary | ICD-10-CM

## 2012-03-08 DIAGNOSIS — R059 Cough, unspecified: Secondary | ICD-10-CM

## 2012-03-08 DIAGNOSIS — R143 Flatulence: Secondary | ICD-10-CM

## 2012-03-08 DIAGNOSIS — L299 Pruritus, unspecified: Secondary | ICD-10-CM | POA: Diagnosis not present

## 2012-03-08 DIAGNOSIS — D219 Benign neoplasm of connective and other soft tissue, unspecified: Secondary | ICD-10-CM

## 2012-03-08 DIAGNOSIS — R350 Frequency of micturition: Secondary | ICD-10-CM | POA: Diagnosis not present

## 2012-03-08 DIAGNOSIS — K219 Gastro-esophageal reflux disease without esophagitis: Secondary | ICD-10-CM

## 2012-03-08 DIAGNOSIS — M8580 Other specified disorders of bone density and structure, unspecified site: Secondary | ICD-10-CM

## 2012-03-08 DIAGNOSIS — R05 Cough: Secondary | ICD-10-CM

## 2012-03-08 DIAGNOSIS — D259 Leiomyoma of uterus, unspecified: Secondary | ICD-10-CM

## 2012-03-08 DIAGNOSIS — R141 Gas pain: Secondary | ICD-10-CM

## 2012-03-08 DIAGNOSIS — E039 Hypothyroidism, unspecified: Secondary | ICD-10-CM

## 2012-03-08 DIAGNOSIS — M949 Disorder of cartilage, unspecified: Secondary | ICD-10-CM | POA: Diagnosis not present

## 2012-03-08 DIAGNOSIS — N952 Postmenopausal atrophic vaginitis: Secondary | ICD-10-CM

## 2012-03-08 DIAGNOSIS — M899 Disorder of bone, unspecified: Secondary | ICD-10-CM

## 2012-03-08 DIAGNOSIS — I10 Essential (primary) hypertension: Secondary | ICD-10-CM

## 2012-03-08 DIAGNOSIS — E042 Nontoxic multinodular goiter: Secondary | ICD-10-CM

## 2012-03-08 DIAGNOSIS — R194 Change in bowel habit: Secondary | ICD-10-CM

## 2012-03-08 DIAGNOSIS — I341 Nonrheumatic mitral (valve) prolapse: Secondary | ICD-10-CM | POA: Insufficient documentation

## 2012-03-08 MED ORDER — TRIAMCINOLONE ACETONIDE 0.5 % EX CREA: TOPICAL_CREAM | Freq: Three times a day (TID) | CUTANEOUS | Status: DC

## 2012-03-08 MED ORDER — ESTRADIOL 0.1 MG/GM VA CREA: 1.0000 g | TOPICAL_CREAM | Freq: Every day | VAGINAL | Status: DC

## 2012-03-08 NOTE — Progress Notes (Signed)
Patient ID: [name], female   DOB: 1936/11/20, 76 y.o.   MRN: [id number]  Subjective:    Patient ID: [name], female    DOB: 28-Jun-1936, 76 y.o.   MRN: [id number]  HPI  The patient presents for a follow-up of  chronic hypertension, chronic dyslipidemia, type 2 diabetes controlled with medicines C/o itching in the mid-back  BP Readings from Last 3 Encounters:  03/08/12 98/68  02/03/12 106/64  10/04/11 114/82   Wt Readings from Last 3 Encounters:  10/04/11 130 lb 9.6 oz (59.24 kg)         Review of Systems  Constitutional: Negative for chills, activity change, appetite change, fatigue and unexpected weight change.  HENT: Negative for congestion, mouth sores and sinus pressure.   Eyes: Negative for visual disturbance.  Respiratory: Negative for cough and chest tightness.   Gastrointestinal: Negative for nausea and abdominal pain.  Genitourinary: Negative for frequency, difficulty urinating and vaginal pain.  Musculoskeletal: Negative for back pain and gait problem.  Skin: Negative for pallor and rash.  Neurological: Negative for dizziness, tremors, weakness, numbness and headaches.  Psychiatric/Behavioral: Negative for confusion and sleep disturbance.       Objective:   Physical Exam  Constitutional: She appears well-developed and well-nourished. No distress.  HENT:  Head: Normocephalic.  Right Ear: External ear normal.  Left Ear: External ear normal.  Nose: Nose normal.  Mouth/Throat: Oropharynx is clear and moist.  Eyes: Conjunctivae are normal. Pupils are equal, round, and reactive to light. Right eye exhibits no discharge. Left eye exhibits no discharge.  Neck: Normal range of motion. Neck supple. No JVD present. No tracheal deviation present. No thyromegaly present.       Mild goiter  Cardiovascular: Normal rate, regular rhythm and normal heart sounds.   Pulmonary/Chest: No stridor. No respiratory distress. She has no wheezes.  Abdominal: Soft. Bowel sounds  are normal. She exhibits no distension and no mass. There is no tenderness. There is no rebound and no guarding.  Musculoskeletal: She exhibits no edema and no tenderness.  Lymphadenopathy:    She has no cervical adenopathy.  Neurological: She displays normal reflexes. No cranial nerve deficit. She exhibits normal muscle tone. Coordination normal.  Skin: No rash noted. No erythema.       Somewhat dry skin on back; no rash  Psychiatric: She has a normal mood and affect. Her behavior is normal. Judgment and thought content normal.     Lab Results  Component Value Date   WBC 5.9 03/01/2012   HGB 13.6 03/01/2012   HCT 40.3 03/01/2012   PLT 245.0 03/01/2012   GLUCOSE 85 03/01/2012   CHOL 177 03/01/2012   TRIG 96.0 03/01/2012   HDL 58.90 03/01/2012   LDLCALC 99 03/01/2012   ALT 23 03/01/2012   AST 27 03/01/2012   NA 139 03/01/2012   K 5.1 03/01/2012   CL 100 03/01/2012   CREATININE 0.7 03/01/2012   BUN 20 03/01/2012   CO2 26 03/01/2012   TSH 2.28 03/01/2012   HGBA1C 6.0 12/05/2006       Assessment & Plan:

## 2012-03-08 NOTE — Patient Instructions (Signed)
Schedule  bone density in late July.

## 2012-03-08 NOTE — Progress Notes (Signed)
Patient came back to see me today for further follow up. We have treated [name] for a long period of time with biphosphonate's for osteopenia. She has been on drug holiday for over 4 years. [name] bone density was stable in 2011. She takes calcium and vitamin D. She has a Systems analyst. She has had no fractures. She is up-to-date on mammograms. She's having no vaginal bleeding. She is having no pelvic pain. She continues with Estrace cream for atrophic vaginitis with excellent results. We have watched [name] with small fibroids. She has a focal area in [name] back which itches. She's been aware of [name] over one year. She saw [name] dermatologist who saw no lesion and suggested she discussed it with [name] internist. She discussed it with [name] PCP today who put [name] on a steroid cream. He did not think she needed imaging for malignancy. She wanted my opinion. She does have some urinary frequency. She does not have dysuria, urgency or incontinence.  ROS: 12 system review done. Pertinent positives above. Other positives include hypothyroidism, mitral valve prolapse requiring antibiotics when she goes to the dentist, hyperlipidemia, lactose intolerance, multinodular goiter and GERD.  Physical examination:[name] present. HEENT within normal limits. Neck: Thyroid not large. No masses. Supraclavicular nodes: not enlarged. Breasts: Examined in both sitting and lying  position. No skin changes and no masses. Abdomen: Soft no guarding rebound or masses or hernia. Pelvic: External: Within normal limits. BUS: Within normal limits. Vaginal:within normal limits. Good estrogen effect. No evidence of cystocele rectocele or enterocele. Cervix: clean. Uterus: Normal size and shape. Adnexa: No masses. Rectovaginal exam: Confirmatory and negative. Extremities: Within normal limits. Skin: no lesion on back.  Assessment: #1. Atrophic vaginitis #2. Osteopenia on drug holiday #3. Fibroids nonpalpable today #4. Focal area of itching on  back.  Plan: Continue yearly mammograms. Recheck breast in 6 months. Bone density this summer. I told patient I did not think the itching was due to would GYN problem.

## 2012-03-09 ENCOUNTER — Telehealth: Payer: Self-pay | Admitting: Internal Medicine

## 2012-03-09 LAB — URINALYSIS W MICROSCOPIC + REFLEX CULTURE
Bacteria, UA: NONE SEEN
Bilirubin Urine: NEGATIVE
Casts: NONE SEEN
Crystals: NONE SEEN
Glucose, UA: NEGATIVE mg/dL
Hgb urine dipstick: NEGATIVE
Ketones, ur: NEGATIVE mg/dL
Leukocytes, UA: NEGATIVE
Nitrite: NEGATIVE
Protein, ur: NEGATIVE mg/dL
Specific Gravity, Urine: 1.017 (ref 1.005–1.030)
Squamous Epithelial / LPF: NONE SEEN
Urobilinogen, UA: 0.2 mg/dL (ref 0.0–1.0)
pH: 6 (ref 5.0–8.0)

## 2012-03-09 NOTE — Telephone Encounter (Signed)
Left message for pt to call back  °

## 2012-03-09 NOTE — Telephone Encounter (Signed)
[name], please, inform patient that her cxr was nl Thx

## 2012-03-10 NOTE — Telephone Encounter (Signed)
I assume no bleeding or unexplained anemia. If not, have her add a daily fiber supplement (citrucel or metamucil 1-2 tablespoons in 12 oz water or juice) and make a follow up appointment is this doesn't help.

## 2012-03-10 NOTE — Telephone Encounter (Signed)
Spoke with pt and she is aware of Dr. Lamar Sprinkles recommendations. She knows to call if this does not help.

## 2012-03-10 NOTE — Telephone Encounter (Signed)
Pt last OV with Dr. Marina Goodell 01/22/11, last colon with Dr. Corinda Gubler 10/2006-due for recall 10/2014. Pt states that she saw Dr. Posey Rea and she has had some changes in her bowel habits. Pt states her bowel movements are soft at times and then hard. She has a lot of bowel movements sometimes and then may have some constipation. Pt states Dr. Posey Rea mentioned that she may need to have the colon done sooner than her recall date. Dr. Marina Goodell please advise.

## 2012-03-10 NOTE — Telephone Encounter (Signed)
Pt informed

## 2012-03-11 ENCOUNTER — Encounter: Payer: Self-pay | Admitting: Internal Medicine

## 2012-03-11 DIAGNOSIS — L299 Pruritus, unspecified: Secondary | ICD-10-CM

## 2012-03-11 HISTORY — DX: Pruritus, unspecified: L29.9

## 2012-03-11 NOTE — Assessment & Plan Note (Signed)
Continue with current prescription therapy as reflected on the Med list.  

## 2012-03-11 NOTE — Assessment & Plan Note (Signed)
Chronic. Better on Gluten free diet OK to d/c Ranitidine

## 2012-03-11 NOTE — Assessment & Plan Note (Signed)
2013 she was started on Synthroid - tol well

## 2012-03-11 NOTE — Assessment & Plan Note (Signed)
Resolved on Gluten free diet

## 2012-03-11 NOTE — Assessment & Plan Note (Signed)
S/p derm eval. ?dry skin D/c ranitidine Triamc cream CXR was ok, labs

## 2012-03-11 NOTE — Assessment & Plan Note (Signed)
She was started on Synthroid - tolerating well now

## 2012-03-11 NOTE — Assessment & Plan Note (Signed)
CXR was ok 

## 2012-04-03 ENCOUNTER — Ambulatory Visit
Admission: RE | Admit: 2012-04-03 | Discharge: 2012-04-03 | Disposition: A | Discharge status: Home / Self Care | Payer: Medicare Other | Source: Ambulatory Visit | Attending: Surgery | Admitting: Surgery

## 2012-04-03 DIAGNOSIS — E042 Nontoxic multinodular goiter: Secondary | ICD-10-CM | POA: Diagnosis not present

## 2012-04-11 ENCOUNTER — Encounter: Payer: Self-pay | Admitting: Internal Medicine

## 2012-04-27 ENCOUNTER — Ambulatory Visit (INDEPENDENT_AMBULATORY_CARE_PROVIDER_SITE_OTHER): Payer: Medicare Other | Admitting: Surgery

## 2012-04-27 ENCOUNTER — Encounter (INDEPENDENT_AMBULATORY_CARE_PROVIDER_SITE_OTHER): Payer: Self-pay | Admitting: Surgery

## 2012-04-27 ENCOUNTER — Other Ambulatory Visit (INDEPENDENT_AMBULATORY_CARE_PROVIDER_SITE_OTHER): Payer: Self-pay

## 2012-04-27 VITALS — BP 90/56 | HR 80 | Temp 98.4°F | Resp 14 | Ht 62.0 in | Wt 122.6 lb

## 2012-04-27 DIAGNOSIS — E042 Nontoxic multinodular goiter: Secondary | ICD-10-CM | POA: Diagnosis not present

## 2012-04-27 MED ORDER — SYNTHROID 50 MCG PO TABS: 50.0000 ug | ORAL_TABLET | Freq: Every day | ORAL | Status: DC

## 2012-04-27 NOTE — Progress Notes (Signed)
Addended by: Joanette Gula on: 04/27/2012 02:32 PM   Modules accepted: Orders

## 2012-04-27 NOTE — Progress Notes (Signed)
Visit Diagnoses: 1. Multinodular goiter (nontoxic)     HISTORY: Patient returns for scheduled followup of bilateral thyroid nodules. Patient is currently taking Synthroid 50 mcg daily. Recent TSH level was normal at 2.28. She is not having any significant side effects.  Ultrasound performed Apr 03, 2012 demonstrates a stable thyroid gland. The gland is inhomogeneous. There appears to be a new nodule in the upper left lobe measuring 1.1 cm. This was relatively ill-defined. Other nodules have remained essentially stable in size. There are no worrisome features.  PERTINENT REVIEW OF SYSTEMS: Denies new mass or discomfort. Denies tremor. Denies palpitations. Denies dysphagia. Denies venous changes.  EXAM: HEENT: normocephalic; pupils equal and reactive; sclerae clear; dentition good; mucous membranes moist NECK:  No palpable nodules; symmetric on extension; no palpable anterior or posterior cervical lymphadenopathy; no supraclavicular masses; no tenderness CHEST: clear to auscultation bilaterally without rales, rhonchi, or wheezes CARDIAC: regular rate and rhythm without significant murmur; peripheral pulses are full EXT:  non-tender without edema; no deformity NEURO: no gross focal deficits; no sign of tremor   IMPRESSION: Bilateral thyroid nodules, dominant nodule left upper lobe 1.1 cm  PLAN: In view of the fact that there is a newly developing nodule in the left upper lobe, I am going to repeat her thyroid ultrasound in one year. She will remain on thyroid hormone supplementation and her present dosage. We will repeat her TSH level prior to her next office visit.  Velora Heckler, MD, FACS General & Endocrine Surgery [facility]

## 2012-05-04 DIAGNOSIS — L280 Lichen simplex chronicus: Secondary | ICD-10-CM | POA: Diagnosis not present

## 2012-05-04 DIAGNOSIS — D239 Other benign neoplasm of skin, unspecified: Secondary | ICD-10-CM | POA: Diagnosis not present

## 2012-05-04 DIAGNOSIS — L821 Other seborrheic keratosis: Secondary | ICD-10-CM | POA: Diagnosis not present

## 2012-05-04 DIAGNOSIS — Z85828 Personal history of other malignant neoplasm of skin: Secondary | ICD-10-CM | POA: Diagnosis not present

## 2012-05-04 DIAGNOSIS — D485 Neoplasm of uncertain behavior of skin: Secondary | ICD-10-CM | POA: Diagnosis not present

## 2012-05-17 ENCOUNTER — Other Ambulatory Visit: Payer: Self-pay | Admitting: Internal Medicine

## 2012-05-17 DIAGNOSIS — Z1231 Encounter for screening mammogram for malignant neoplasm of breast: Secondary | ICD-10-CM

## 2012-05-24 ENCOUNTER — Encounter: Payer: Self-pay | Admitting: Internal Medicine

## 2012-05-24 ENCOUNTER — Ambulatory Visit (INDEPENDENT_AMBULATORY_CARE_PROVIDER_SITE_OTHER): Payer: Medicare Other | Admitting: Internal Medicine

## 2012-05-24 VITALS — BP 90/60 | HR 76 | Ht 62.0 in | Wt 122.4 lb

## 2012-05-24 DIAGNOSIS — R140 Abdominal distension (gaseous): Secondary | ICD-10-CM

## 2012-05-24 DIAGNOSIS — K589 Irritable bowel syndrome without diarrhea: Secondary | ICD-10-CM | POA: Diagnosis not present

## 2012-05-24 DIAGNOSIS — R141 Gas pain: Secondary | ICD-10-CM | POA: Diagnosis not present

## 2012-05-24 DIAGNOSIS — K219 Gastro-esophageal reflux disease without esophagitis: Secondary | ICD-10-CM | POA: Diagnosis not present

## 2012-05-24 DIAGNOSIS — R142 Eructation: Secondary | ICD-10-CM | POA: Diagnosis not present

## 2012-05-24 DIAGNOSIS — Z86010 Personal history of colonic polyps: Secondary | ICD-10-CM | POA: Diagnosis not present

## 2012-05-24 MED ORDER — MOVIPREP 100 G PO SOLR: 1.0000 | Freq: Once | ORAL | Status: DC

## 2012-05-24 MED ORDER — LANSOPRAZOLE 30 MG PO CPDR: 30.0000 mg | DELAYED_RELEASE_CAPSULE | Freq: Every day | ORAL | Status: DC

## 2012-05-24 NOTE — Patient Instructions (Addendum)
  We have sent the following medications to your pharmacy for you to pick up at your convenience:  Prevacid  You have been scheduled for a colonoscopy with propofol. Please follow written instructions given to you at your visit today.  Please pick up your prep kit at the pharmacy within the next 1-3 days.

## 2012-05-24 NOTE — Progress Notes (Signed)
HISTORY OF PRESENT ILLNESS:  [name] is a 76 y.o. female with hyperlipidemia, osteoporosis, GERD, colon polyps,and irritable bowel syndrome. She presents today regarding her GI problems, alternating bowel habits, and requests colonoscopy. She was last seen in the office February 2012. See that dictation. For GERD she is continued on AcipHex. She states this helps. However, she complains about the cost and request an alternative therapy. Next, for problems with gas she was placed on probiotic Align. She states this resulted in too frequent of bowel movements. On the advice of her primary provider and cardiologist, she tells me that she began a gluten-free diet several months ago. On this diet she states she has had improved control of her reflux and less problems with gas. She is lost 10 pounds. In terms of her bowel habits. Alternate between hard stool balls and soft stools. Still with some chronic intermittent right-sided abdominal discomfort. Her last colonoscopy with Dr. Corinda Gubler was in December 2007. Diverticulosis but no polyps. Followup in 8 years recommended. No family history of colon cancer. Review of outside blood work from April 2013 finds a normal CBC, comprehensive metabolic panel, thyroid studies, and liver profile.  REVIEW OF SYSTEMS:  All non-GI ROS negative except for arthritis  Past Medical History  Diagnosis Date  . GERD (gastroesophageal reflux disease)   . Osteoporosis   . Hyperlipidemia   . OA (osteoarthritis)   . Allergic rhinitis   . Baker's cyst     Left-Dr. Aluisio  . Torn meniscus     bilateral  . MVP (mitral valve prolapse)     Antibiotics required for dental procedures  . Cataract     Dr. Dione Booze  . Overactive bladder   . Atrophic vaginitis   . Fibroid   . Thyroid nodule   . Heart murmur   . Thyroid disease   . IBS (irritable bowel syndrome)     Past Surgical History  Procedure Date  . Cataract extraction, bilateral   . Tonsillectomy     Social  History [name]  reports that she has quit smoking. She has never used smokeless tobacco. She reports that she drinks about 2.5 ounces of alcohol per week. She reports that she does not use illicit drugs.  family history includes Heart disease in her mother; Hypertension in her mother; Osteoporosis in her mother; and Pancreatic cancer in her father.  There is no history of Colon cancer.  Allergies  Allergen Reactions  . Omeprazole     REACTION: did not work       PHYSICAL EXAMINATION: Vital signs: BP 90/60  Pulse 76  Ht 5\' 2"  (1.575 m)  Wt 122 lb 6.4 oz (55.52 kg)  BMI 22.39 kg/m2  Constitutional: generally well-appearing, no acute distress Psychiatric: alert and oriented x3, cooperative Eyes: extraocular movements intact, anicteric, conjunctiva pink Mouth: oral pharynx moist, no lesions Neck: supple no lymphadenopathy Cardiovascular: heart regular rate and rhythm, no murmur Lungs: clear to auscultation bilaterally Abdomen: soft, nontender, nondistended, no obvious ascites, no peritoneal signs, normal bowel sounds, no organomegaly Rectal:for until colonoscopy Extremities: no lower extremity edema bilaterally Skin: no lesions on visible extremities Neuro: No focal deficits.   ASSESSMENT:  #1. GERD. Symptoms currently well controlled with AcipHex. Request alternative therapy due to cost #2. Irritable bowel syndrome. Ongoing problems with alternating bowel habits, discomfort, bloating. Seems to have had some improvement in problems with gas on gluten-free diet #3. History of colon polyps. Last colonoscopy 2007.   PLAN:  #1. Okay to continue  with gluten free diet if she wishes #2. List of all known PPIs provided. She may choose one. She would like to start with Prevacid . One-month supply prescribed #3. Surveillance colonoscopy.The nature of the procedure, as well as the risks, benefits, and alternatives were carefully and thoroughly reviewed with the patient. Ample time  for discussion and questions allowed. The patient understood, was satisfied, and agreed to proceed.  #4. Movi prep prescribed. Patient instructed on its use

## 2012-05-25 DIAGNOSIS — H5000 Unspecified esotropia: Secondary | ICD-10-CM | POA: Diagnosis not present

## 2012-06-08 DIAGNOSIS — L989 Disorder of the skin and subcutaneous tissue, unspecified: Secondary | ICD-10-CM | POA: Diagnosis not present

## 2012-06-08 DIAGNOSIS — T148XXA Other injury of unspecified body region, initial encounter: Secondary | ICD-10-CM | POA: Diagnosis not present

## 2012-06-21 ENCOUNTER — Other Ambulatory Visit: Payer: Self-pay | Admitting: Internal Medicine

## 2012-06-21 ENCOUNTER — Ambulatory Visit
Admission: RE | Admit: 2012-06-21 | Discharge: 2012-06-21 | Disposition: A | Discharge status: Home / Self Care | Payer: Medicare Other | Source: Ambulatory Visit | Attending: Internal Medicine | Admitting: Internal Medicine

## 2012-06-21 DIAGNOSIS — Z1231 Encounter for screening mammogram for malignant neoplasm of breast: Secondary | ICD-10-CM | POA: Diagnosis not present

## 2012-06-21 DIAGNOSIS — C44621 Squamous cell carcinoma of skin of unspecified upper limb, including shoulder: Secondary | ICD-10-CM | POA: Diagnosis not present

## 2012-06-21 DIAGNOSIS — D485 Neoplasm of uncertain behavior of skin: Secondary | ICD-10-CM | POA: Diagnosis not present

## 2012-06-21 DIAGNOSIS — L821 Other seborrheic keratosis: Secondary | ICD-10-CM | POA: Diagnosis not present

## 2012-06-28 ENCOUNTER — Encounter: Payer: Self-pay | Admitting: Internal Medicine

## 2012-06-28 ENCOUNTER — Ambulatory Visit (AMBULATORY_SURGERY_CENTER): Payer: Medicare Other | Admitting: Internal Medicine

## 2012-06-28 VITALS — BP 133/70 | HR 65 | Temp 97.1°F | Resp 25 | Ht 62.0 in | Wt 122.0 lb

## 2012-06-28 DIAGNOSIS — R141 Gas pain: Secondary | ICD-10-CM

## 2012-06-28 DIAGNOSIS — Z1211 Encounter for screening for malignant neoplasm of colon: Secondary | ICD-10-CM

## 2012-06-28 DIAGNOSIS — K219 Gastro-esophageal reflux disease without esophagitis: Secondary | ICD-10-CM

## 2012-06-28 DIAGNOSIS — K589 Irritable bowel syndrome without diarrhea: Secondary | ICD-10-CM

## 2012-06-28 DIAGNOSIS — R198 Other specified symptoms and signs involving the digestive system and abdomen: Secondary | ICD-10-CM | POA: Diagnosis not present

## 2012-06-28 DIAGNOSIS — Z86010 Personal history of colonic polyps: Secondary | ICD-10-CM

## 2012-06-28 MED ORDER — SODIUM CHLORIDE 0.9 % IV SOLN: 500.0000 mL | INTRAVENOUS | Status: DC

## 2012-06-28 NOTE — Patient Instructions (Addendum)
YOU HAD AN ENDOSCOPIC PROCEDURE TODAY AT THE [facility]: Refer to the procedure report that was given to you for any specific questions about what was found during the examination.  If the procedure report does not answer your questions, please call your gastroenterologist to clarify.  If you requested that your care partner not be given the details of your procedure findings, then the procedure report has been included in a sealed envelope for you to review at your convenience later.  YOU SHOULD EXPECT: Some feelings of bloating in the abdomen. Passage of more gas than usual.  Walking can help get rid of the air that was put into your GI tract during the procedure and reduce the bloating. If you had a lower endoscopy (such as a colonoscopy or flexible sigmoidoscopy) you may notice spotting of blood in your stool or on the toilet paper. If you underwent a bowel prep for your procedure, then you may not have a normal bowel movement for a few days.  DIET: Your first meal following the procedure should be a light meal and then it is ok to progress to your normal diet.  A half-sandwich or bowl of soup is an example of a good first meal.  Heavy or fried foods are harder to digest and may make you feel nauseous or bloated.  Likewise meals heavy in dairy and vegetables can cause extra gas to form and this can also increase the bloating.  Drink plenty of fluids but you should avoid alcoholic beverages for 24 hours.  ACTIVITY: Your care partner should take you home directly after the procedure.  You should plan to take it easy, moving slowly for the rest of the day.  You can resume normal activity the day after the procedure however you should NOT DRIVE or use heavy machinery for 24 hours (because of the sedation medicines used during the test).    SYMPTOMS TO REPORT IMMEDIATELY: A gastroenterologist can be reached at any hour.  During normal business hours, 8:30 AM to 5:00 PM Monday through Friday,  call [phone].  After hours and on weekends, please call the GI answering service at [phone] who will take a message and have the physician on call contact you.   Following lower endoscopy (colonoscopy or flexible sigmoidoscopy):  Excessive amounts of blood in the stool  Significant tenderness or worsening of abdominal pains  Swelling of the abdomen that is new, acute  Fever of 100F or higher  Following upper endoscopy (EGD)  Vomiting of blood or coffee ground material  New chest pain or pain under the shoulder blades  Painful or persistently difficult swallowing  New shortness of breath  Fever of 100F or higher  Black, tarry-looking stools  FOLLOW UP: If any biopsies were taken you will be contacted by phone or by letter within the next 1-3 weeks.  Call your gastroenterologist if you have not heard about the biopsies in 3 weeks.  Our staff will call the home number listed on your records the next business day following your procedure to check on you and address any questions or concerns that you may have at that time regarding the information given to you following your procedure. This is a courtesy call and so if there is no answer at the home number and we have not heard from you through the emergency physician on call, we will assume that you have returned to your regular daily activities without incident.  SIGNATURES/CONFIDENTIALITY: You and/or your care   partner have signed paperwork which will be entered into your electronic medical record.  These signatures attest to the fact that that the information above on your After Visit Summary has been reviewed and is understood.  Full responsibility of the confidentiality of this discharge information lies with you and/or your care-partner.  

## 2012-06-28 NOTE — Progress Notes (Signed)
Patient did not experience any of the following events: a burn prior to discharge; a fall within the facility; wrong site/side/patient/procedure/implant event; or a hospital transfer or hospital admission upon discharge from the facility. (G8907) Patient did not have preoperative order for IV antibiotic SSI prophylaxis. (G8918)  

## 2012-06-28 NOTE — Progress Notes (Signed)
Abdominal pressure by the tech to aid the scope advancement to reach the cecum. Maw   

## 2012-06-28 NOTE — Progress Notes (Signed)
16:08 Andrey Farmer, CRNA applied bag to assist breathing at 15 liter d/t pt hypoxic - swallow breaths.  Sats increased to above 90%. Maw  Dr. Florene Glen. maw  16:10 Per Andrey Farmer, CRNA pt back on nasal canula at 4 liter sats sats 96%. maw

## 2012-06-28 NOTE — Op Note (Signed)
[facility] [address] [place], [place]  [address]  COLONOSCOPY PROCEDURE REPORT  PATIENT:  [name], [name]  MR#:  [id number] BIRTHDATE:  [birth year], 75 yrs. old  GENDER:  female ENDOSCOPIST:  Wilhemina Bonito. Eda Keys, MD REF. BY:  Office PROCEDURE DATE:  06/28/2012 PROCEDURE:  Surveillance Colonoscopy ASA CLASS:  Class II INDICATIONS:  history of polyps, surveillance and high-risk screening ; multiple prior colonoscopies [facility]) dating back to 1994 (polyps); most recent 2007 (negative) MEDICATIONS:   MAC sedation, administered by CRNA, propofol (Diprivan) 220 mg IV  DESCRIPTION OF PROCEDURE:   After the risks benefits and alternatives of the procedure were thoroughly explained, informed consent was obtained.  Digital rectal exam was performed and revealed no abnormalities.   The LB CF-[id number] endoscope was introduced through the anus and advanced to the cecum, which was identified by both the appendix and ileocecal valve, without limitations.  The quality of the prep was good, using MoviPrep. The instrument was then slowly withdrawn as the colon was fully examined. <<PROCEDUREIMAGES>>  FINDINGS:  A few Scattered diverticula were found.  Otherwise normal colonoscopy without  polyps, masses, vascular ectasias, or inflammatory changes.   Retroflexed views in the rectum revealed internal hemorrhoids.    The time to cecum =  5:02  minutes. The scope was then withdrawn in  12:28  minutes from the cecum and the procedure completed.  COMPLICATIONS:  None  ENDOSCOPIC IMPRESSION: 1) Diverticula, a few scattered 2) Otherwise normal colonoscopy 3) Small Internal hemorrhoids  RECOMMENDATIONS: 1) Return to the care of your primary provider for your routine care. GI follow up as needed  ______________________________ Wilhemina Bonito. Eda Keys, MD  CC:  Linda Hedges. Plotnikov, MD;  The Patient  n. eSIGNED:   Wilhemina Bonito. Eda Keys at 06/28/2012 04:30 PM  Casilda Carls, [phone]

## 2012-06-28 NOTE — Progress Notes (Signed)
16:19 Andrey Farmer, CRNA hung 2nd bag of n.s. 0.9% 500 ml. Maw

## 2012-06-29 ENCOUNTER — Telehealth: Payer: Self-pay

## 2012-06-29 NOTE — Telephone Encounter (Signed)
Left a message on the pt's answering machine to call us if she has any questions or concerns. Maw   

## 2012-07-18 ENCOUNTER — Other Ambulatory Visit (INDEPENDENT_AMBULATORY_CARE_PROVIDER_SITE_OTHER): Payer: Medicare Other

## 2012-07-18 DIAGNOSIS — R05 Cough: Secondary | ICD-10-CM | POA: Diagnosis not present

## 2012-07-18 DIAGNOSIS — R059 Cough, unspecified: Secondary | ICD-10-CM

## 2012-07-18 DIAGNOSIS — M549 Dorsalgia, unspecified: Secondary | ICD-10-CM | POA: Diagnosis not present

## 2012-07-18 DIAGNOSIS — E039 Hypothyroidism, unspecified: Secondary | ICD-10-CM | POA: Diagnosis not present

## 2012-07-18 LAB — BASIC METABOLIC PANEL
BUN: 16 mg/dL (ref 6–23)
CO2: 30 mEq/L (ref 19–32)
Calcium: 9.2 mg/dL (ref 8.4–10.5)
Chloride: 104 mEq/L (ref 96–112)
Creatinine, Ser: 0.8 mg/dL (ref 0.4–1.2)
GFR: 72.42 mL/min (ref >60.00)
Glucose, Bld: 91 mg/dL (ref 70–99)
Potassium: 4.7 mEq/L (ref 3.5–5.1)
Sodium: 139 mEq/L (ref 135–145)

## 2012-07-18 LAB — HEPATIC FUNCTION PANEL
ALT: 21 U/L (ref 0–35)
AST: 25 U/L (ref 0–37)
Albumin: 3.9 g/dL (ref 3.5–5.2)
Alkaline Phosphatase: 36 U/L — ABNORMAL LOW (ref 39–117)
Bilirubin, Direct: 0.1 mg/dL (ref 0.0–0.3)
Total Bilirubin: 1 mg/dL (ref 0.3–1.2)
Total Protein: 7.3 g/dL (ref 6.0–8.3)

## 2012-07-18 LAB — URINALYSIS, ROUTINE W REFLEX MICROSCOPIC
Bilirubin Urine: NEGATIVE
Hgb urine dipstick: NEGATIVE
Ketones, ur: NEGATIVE
Nitrite: NEGATIVE
Specific Gravity, Urine: 1.01 (ref 1.000–1.030)
Total Protein, Urine: NEGATIVE
Urine Glucose: NEGATIVE
Urobilinogen, UA: 0.2 (ref 0.0–1.0)
pH: 7 (ref 5.0–8.0)

## 2012-07-18 LAB — TSH: TSH: 3.55 u[IU]/mL (ref 0.35–5.50)

## 2012-07-19 ENCOUNTER — Ambulatory Visit (INDEPENDENT_AMBULATORY_CARE_PROVIDER_SITE_OTHER): Payer: Medicare Other | Admitting: Internal Medicine

## 2012-07-19 ENCOUNTER — Encounter: Payer: Self-pay | Admitting: Internal Medicine

## 2012-07-19 VITALS — BP 100/60 | HR 84 | Temp 97.4°F | Resp 16 | Wt 120.0 lb

## 2012-07-19 DIAGNOSIS — I10 Essential (primary) hypertension: Secondary | ICD-10-CM

## 2012-07-19 DIAGNOSIS — E782 Mixed hyperlipidemia: Secondary | ICD-10-CM

## 2012-07-19 DIAGNOSIS — E785 Hyperlipidemia, unspecified: Secondary | ICD-10-CM | POA: Diagnosis not present

## 2012-07-19 DIAGNOSIS — K589 Irritable bowel syndrome without diarrhea: Secondary | ICD-10-CM

## 2012-07-19 DIAGNOSIS — E039 Hypothyroidism, unspecified: Secondary | ICD-10-CM

## 2012-07-19 DIAGNOSIS — Z0000 Encounter for general adult medical examination without abnormal findings: Secondary | ICD-10-CM

## 2012-07-19 DIAGNOSIS — K219 Gastro-esophageal reflux disease without esophagitis: Secondary | ICD-10-CM

## 2012-07-19 DIAGNOSIS — J309 Allergic rhinitis, unspecified: Secondary | ICD-10-CM | POA: Diagnosis not present

## 2012-07-19 DIAGNOSIS — L905 Scar conditions and fibrosis of skin: Secondary | ICD-10-CM | POA: Diagnosis not present

## 2012-07-19 DIAGNOSIS — C44621 Squamous cell carcinoma of skin of unspecified upper limb, including shoulder: Secondary | ICD-10-CM | POA: Diagnosis not present

## 2012-07-19 NOTE — Assessment & Plan Note (Signed)
Continue with current prescription therapy as reflected on the Med list.  

## 2012-07-19 NOTE — Assessment & Plan Note (Signed)
Continue with current prescription therapy as reflected on the Med list. If leg cramps - hold Simvastatin

## 2012-07-19 NOTE — Assessment & Plan Note (Signed)
Chronic  Marina Goodell MD, Wilhemina Bonito 2013 - much better on gluten free diet

## 2012-07-19 NOTE — Progress Notes (Signed)
   Subjective:    Patient ID: [name], female    DOB: 1936/11/26, 76 y.o.   MRN: [id number]  HPI  The patient presents for a follow-up of  chronic hypertension, chronic dyslipidemia, type 2 diabetes controlled with medicines   BP Readings from Last 3 Encounters:  07/19/12 100/60  06/28/12 133/70  05/24/12 90/60   Wt Readings from Last 3 Encounters:  07/19/12 120 lb (54.432 kg)  06/28/12 122 lb (55.339 kg)  05/24/12 122 lb 6.4 oz (55.52 kg)         Review of Systems  Constitutional: Negative for chills, activity change, appetite change, fatigue and unexpected weight change.  HENT: Negative for congestion, mouth sores and sinus pressure.   Eyes: Negative for visual disturbance.  Respiratory: Negative for cough and chest tightness.   Gastrointestinal: Negative for nausea and abdominal pain.  Genitourinary: Negative for frequency, difficulty urinating and vaginal pain.  Musculoskeletal: Negative for back pain and gait problem.  Skin: Negative for pallor and rash.  Neurological: Negative for dizziness, tremors, weakness, numbness and headaches.  Psychiatric/Behavioral: Negative for confusion and disturbed wake/sleep cycle.       Objective:   Physical Exam  Constitutional: She appears well-developed and well-nourished. No distress.  HENT:  Head: Normocephalic.  Right Ear: External ear normal.  Left Ear: External ear normal.  Nose: Nose normal.  Mouth/Throat: Oropharynx is clear and moist.  Eyes: Conjunctivae are normal. Pupils are equal, round, and reactive to light. Right eye exhibits no discharge. Left eye exhibits no discharge.  Neck: Normal range of motion. Neck supple. No JVD present. No tracheal deviation present. No thyromegaly present.       Mild goiter  Cardiovascular: Normal rate, regular rhythm and normal heart sounds.   Pulmonary/Chest: No stridor. No respiratory distress. She has no wheezes.  Abdominal: Soft. Bowel sounds are normal. She exhibits no  distension and no mass. There is no tenderness. There is no rebound and no guarding.  Musculoskeletal: She exhibits no edema and no tenderness.  Lymphadenopathy:    She has no cervical adenopathy.  Neurological: She displays normal reflexes. No cranial nerve deficit. She exhibits normal muscle tone. Coordination normal.  Skin: No rash noted. No erythema.       Somewhat dry skin on back; no rash  Psychiatric: She has a normal mood and affect. Her behavior is normal. Judgment and thought content normal.     Lab Results  Component Value Date   WBC 5.9 03/01/2012   HGB 13.6 03/01/2012   HCT 40.3 03/01/2012   PLT 245.0 03/01/2012   GLUCOSE 91 07/18/2012   CHOL 177 03/01/2012   TRIG 96.0 03/01/2012   HDL 58.90 03/01/2012   LDLCALC 99 03/01/2012   ALT 21 07/18/2012   AST 25 07/18/2012   NA 139 07/18/2012   K 4.7 07/18/2012   CL 104 07/18/2012   CREATININE 0.8 07/18/2012   BUN 16 07/18/2012   CO2 30 07/18/2012   TSH 3.55 07/18/2012   HGBA1C 6.0 12/05/2006       Assessment & Plan:

## 2012-07-19 NOTE — Assessment & Plan Note (Signed)
Chronic  Marina Goodell MD, Wilhemina Bonito 2013 - much better on gluten free diet - on Prevacid now

## 2012-07-20 ENCOUNTER — Ambulatory Visit (INDEPENDENT_AMBULATORY_CARE_PROVIDER_SITE_OTHER): Payer: Medicare Other

## 2012-07-20 DIAGNOSIS — M899 Disorder of bone, unspecified: Secondary | ICD-10-CM | POA: Diagnosis not present

## 2012-07-20 DIAGNOSIS — M949 Disorder of cartilage, unspecified: Secondary | ICD-10-CM

## 2012-07-20 DIAGNOSIS — M8580 Other specified disorders of bone density and structure, unspecified site: Secondary | ICD-10-CM

## 2012-07-28 ENCOUNTER — Encounter: Payer: Self-pay | Admitting: Obstetrics and Gynecology

## 2012-08-02 ENCOUNTER — Ambulatory Visit (INDEPENDENT_AMBULATORY_CARE_PROVIDER_SITE_OTHER): Payer: Medicare Other | Admitting: Internal Medicine

## 2012-08-02 ENCOUNTER — Telehealth: Payer: Self-pay | Admitting: Internal Medicine

## 2012-08-02 ENCOUNTER — Encounter: Payer: Self-pay | Admitting: Internal Medicine

## 2012-08-02 VITALS — BP 110/62 | HR 74 | Temp 97.5°F | Resp 16

## 2012-08-02 DIAGNOSIS — J069 Acute upper respiratory infection, unspecified: Secondary | ICD-10-CM

## 2012-08-02 MED ORDER — PROMETHAZINE-CODEINE 6.25-10 MG/5ML PO SYRP: 5.0000 mL | ORAL_SOLUTION | ORAL | Status: AC | PRN

## 2012-08-02 MED ORDER — AZITHROMYCIN 250 MG PO TABS: ORAL_TABLET | ORAL | Status: AC

## 2012-08-02 NOTE — Telephone Encounter (Signed)
Pt informed to be here at 11 am today.

## 2012-08-02 NOTE — Patient Instructions (Addendum)
Use over-the-counter  "cold" medicines  such as  "Afrin" nasal spray for nasal congestion as directed instead. Use" Delsym" or" Robitussin" cough syrup varietis for cough.  You can use plain "Tylenol" or "Advil" for fever, chills and achyness. Use Mucinex   "Common cold" symptoms are usually triggered by a virus.  The antibiotics are usually not necessary. On average, a" viral cold" illness would take 4-7 days to resolve. Please, make an appointment if you are not better or if you're worse.  VOICE REST

## 2012-08-02 NOTE — Telephone Encounter (Signed)
C/o URI sx's [name], pls work her in today Thx

## 2012-08-03 ENCOUNTER — Encounter: Payer: Self-pay | Admitting: Internal Medicine

## 2012-08-03 DIAGNOSIS — J069 Acute upper respiratory infection, unspecified: Secondary | ICD-10-CM

## 2012-08-03 HISTORY — DX: Acute upper respiratory infection, unspecified: J06.9

## 2012-08-03 MED ORDER — MUCINEX DM 30-600 MG PO TB12: ORAL_TABLET | ORAL | Status: DC

## 2012-08-07 ENCOUNTER — Encounter: Payer: Self-pay | Admitting: Internal Medicine

## 2012-08-07 ENCOUNTER — Ambulatory Visit (INDEPENDENT_AMBULATORY_CARE_PROVIDER_SITE_OTHER): Payer: Medicare Other | Admitting: Internal Medicine

## 2012-08-07 ENCOUNTER — Ambulatory Visit (INDEPENDENT_AMBULATORY_CARE_PROVIDER_SITE_OTHER)
Admission: RE | Admit: 2012-08-07 | Discharge: 2012-08-07 | Disposition: A | Discharge status: Home / Self Care | Payer: Medicare Other | Source: Ambulatory Visit | Attending: Internal Medicine | Admitting: Internal Medicine

## 2012-08-07 VITALS — BP 110/66 | HR 68 | Temp 97.1°F | Resp 16

## 2012-08-07 DIAGNOSIS — R05 Cough: Secondary | ICD-10-CM

## 2012-08-07 DIAGNOSIS — J309 Allergic rhinitis, unspecified: Secondary | ICD-10-CM

## 2012-08-07 DIAGNOSIS — R0789 Other chest pain: Secondary | ICD-10-CM | POA: Diagnosis not present

## 2012-08-07 DIAGNOSIS — J069 Acute upper respiratory infection, unspecified: Secondary | ICD-10-CM

## 2012-08-07 DIAGNOSIS — R059 Cough, unspecified: Secondary | ICD-10-CM | POA: Diagnosis not present

## 2012-08-07 MED ORDER — PREDNISONE 20 MG PO TABS: 20.0000 mg | ORAL_TABLET | Freq: Two times a day (BID) | ORAL | Status: AC

## 2012-08-07 NOTE — Assessment & Plan Note (Signed)
I think she has a post-viral URI cough, I will check a CXR today to see if she has PNA and she will start steroids for symptom relief

## 2012-08-07 NOTE — Assessment & Plan Note (Signed)
She will continue/complete her current meds and will try steroids for additional symptom relief

## 2012-08-07 NOTE — Patient Instructions (Signed)

## 2012-08-07 NOTE — Assessment & Plan Note (Signed)
She'll try steroids for relief of her symptoms

## 2012-08-07 NOTE — Progress Notes (Signed)
  Subjective:    Patient ID: [name], female    DOB: 1935-12-12, 76 y.o.   MRN: [id number]  Cough This is a recurrent problem. The current episode started in the past 7 days. The problem has been gradually improving. The problem occurs every few hours. The cough is productive of purulent sputum. Associated symptoms include nasal congestion, postnasal drip and rhinorrhea. Pertinent negatives include no chest pain, chills, ear congestion, ear pain, fever, headaches, heartburn, hemoptysis, myalgias, rash, sore throat, shortness of breath, sweats, weight loss or wheezing. Nothing aggravates the symptoms. She has tried OTC cough suppressant and prescription cough suppressant for the symptoms. The treatment provided mild relief. Her past medical history is significant for asthma and environmental allergies.      Review of Systems  Constitutional: Negative for fever, chills, weight loss, diaphoresis, activity change, appetite change, fatigue and unexpected weight change.  HENT: Positive for congestion, rhinorrhea, sneezing and postnasal drip. Negative for hearing loss, ear pain, nosebleeds, sore throat, facial swelling, trouble swallowing, neck pain, sinus pressure and tinnitus.   Eyes: Negative.   Respiratory: Positive for cough. Negative for apnea, hemoptysis, choking, chest tightness, shortness of breath, wheezing and stridor.   Cardiovascular: Negative for chest pain, palpitations and leg swelling.  Gastrointestinal: Negative.  Negative for heartburn.  Genitourinary: Negative.   Musculoskeletal: Negative for myalgias, back pain, joint swelling, arthralgias and gait problem.  Skin: Negative for color change, pallor, rash and wound.  Neurological: Negative.  Negative for headaches.  Hematological: Positive for environmental allergies. Negative for adenopathy. Does not bruise/bleed easily.  Psychiatric/Behavioral: Negative.        Objective:   Physical Exam  Vitals reviewed. Constitutional:  She is oriented to person, place, and time. She appears well-developed and well-nourished.  Non-toxic appearance. She does not have a sickly appearance. She does not appear ill. No distress.  HENT:  Head: Normocephalic and atraumatic.  Mouth/Throat: Oropharynx is clear and moist. No oropharyngeal exudate.  Eyes: Conjunctivae are normal. Right eye exhibits no discharge. Left eye exhibits no discharge. No scleral icterus.  Neck: Normal range of motion. Neck supple. No JVD present. No tracheal deviation present. No thyromegaly present.  Cardiovascular: Normal rate, regular rhythm, normal heart sounds and intact distal pulses.  Exam reveals no gallop and no friction rub.   No murmur heard. Pulmonary/Chest: Effort normal and breath sounds normal. No stridor. No respiratory distress. She has no wheezes. She has no rales. She exhibits no tenderness.  Abdominal: Soft. Bowel sounds are normal. She exhibits no distension and no mass. There is no tenderness. There is no rebound and no guarding.  Musculoskeletal: Normal range of motion. She exhibits no edema and no tenderness.  Lymphadenopathy:    She has no cervical adenopathy.  Neurological: She is oriented to person, place, and time.  Skin: Skin is warm and dry. No rash noted. She is not diaphoretic. No erythema. No pallor.  Psychiatric: She has a normal mood and affect. Her behavior is normal. Judgment and thought content normal.          Assessment & Plan:

## 2012-08-11 ENCOUNTER — Encounter: Payer: Self-pay | Admitting: Internal Medicine

## 2012-08-11 NOTE — Assessment & Plan Note (Signed)
Zpac Prom w/cod cough syr CXR if needed

## 2012-08-11 NOTE — Progress Notes (Signed)
   Subjective:    Patient ID: [name], female    DOB: 1936/10/06, 76 y.o.   MRN: [id number]  HPI   C/o URI sx's x 2-3 d - cough and hoarsness. C/o ST. No n/v  BP Readings from Last 3 Encounters:  08/07/12 110/66  08/02/12 110/62  07/19/12 100/60   Wt Readings from Last 3 Encounters:  07/19/12 120 lb (54.432 kg)  06/28/12 122 lb (55.339 kg)  05/24/12 122 lb 6.4 oz (55.52 kg)         Review of Systems  Constitutional: Negative for chills, activity change, appetite change, fatigue and unexpected weight change.  HENT: Negative for congestion, mouth sores and sinus pressure.   Eyes: Negative for visual disturbance.  Respiratory: Negative for cough and chest tightness.   Gastrointestinal: Negative for nausea and abdominal pain.  Genitourinary: Negative for frequency, difficulty urinating and vaginal pain.  Musculoskeletal: Negative for back pain and gait problem.  Skin: Negative for pallor and rash.  Neurological: Negative for dizziness, tremors, weakness, numbness and headaches.  Psychiatric/Behavioral: Negative for confusion and disturbed wake/sleep cycle.       Objective:   Physical Exam  Constitutional: She appears well-developed and well-nourished. No distress.  HENT:  Head: Normocephalic.  Right Ear: External ear normal.  Left Ear: External ear normal.  Nose: Nose normal.  Mouth/Throat: Oropharynx is clear and moist.  Eyes: Conjunctivae normal are normal. Pupils are equal, round, and reactive to light. Right eye exhibits no discharge. Left eye exhibits no discharge.  Neck: Normal range of motion. Neck supple. No JVD present. No tracheal deviation present. No thyromegaly present.       Mild goiter  Cardiovascular: Normal rate, regular rhythm and normal heart sounds.   Pulmonary/Chest: No stridor. No respiratory distress. She has no wheezes.  Abdominal: Soft. Bowel sounds are normal. She exhibits no distension and no mass. There is no tenderness. There is no  rebound and no guarding.  Musculoskeletal: She exhibits no edema and no tenderness.  Lymphadenopathy:    She has no cervical adenopathy.  Neurological: She displays normal reflexes. No cranial nerve deficit. She exhibits normal muscle tone. Coordination normal.  Skin: No rash noted. No erythema.       Somewhat dry skin on back; no rash  Psychiatric: She has a normal mood and affect. Her behavior is normal. Judgment and thought content normal.     Lab Results  Component Value Date   WBC 5.9 03/01/2012   HGB 13.6 03/01/2012   HCT 40.3 03/01/2012   PLT 245.0 03/01/2012   GLUCOSE 91 07/18/2012   CHOL 177 03/01/2012   TRIG 96.0 03/01/2012   HDL 58.90 03/01/2012   LDLCALC 99 03/01/2012   ALT 21 07/18/2012   AST 25 07/18/2012   NA 139 07/18/2012   K 4.7 07/18/2012   CL 104 07/18/2012   CREATININE 0.8 07/18/2012   BUN 16 07/18/2012   CO2 30 07/18/2012   TSH 3.55 07/18/2012   HGBA1C 6.0 12/05/2006       Assessment & Plan:

## 2012-08-14 ENCOUNTER — Encounter: Payer: Self-pay | Admitting: Internal Medicine

## 2012-08-14 ENCOUNTER — Telehealth: Payer: Self-pay | Admitting: Internal Medicine

## 2012-08-14 ENCOUNTER — Ambulatory Visit (INDEPENDENT_AMBULATORY_CARE_PROVIDER_SITE_OTHER): Payer: Medicare Other | Admitting: Internal Medicine

## 2012-08-14 VITALS — BP 122/82 | HR 80 | Temp 97.8°F | Resp 16 | Wt 120.0 lb

## 2012-08-14 DIAGNOSIS — J069 Acute upper respiratory infection, unspecified: Secondary | ICD-10-CM

## 2012-08-14 MED ORDER — PSEUDOEPHEDRINE HCL 30 MG PO TABS: 30.0000 mg | ORAL_TABLET | Freq: Two times a day (BID) | ORAL | Status: DC | PRN

## 2012-08-14 NOTE — Telephone Encounter (Signed)
Caller: Kaysha/Patient; Patient Name: [name]; PCP: Sonda Primes (Adults only); Best Callback Phone Number: [phone]. States that she was seen in office by Dr. Posey Rea 2 weeks ago for respiratory virus. Placed on Z-Pack. Came back to office on Monday, 08/07/12 and saw Dr. Yetta Barre for same symptoms and was placed on Prednisone. Throat is still sore with slight dry cough. States that all symptoms are the same as when last seen. Wants an appointment with Dr. Posey Rea for today. Afebrile. Sore throat is the worst symptom. All emergent symptoms per Sore Throat protocol ruled out. Home care advice given. Advised appointment within 24 hours per guidelines. Appointment scheduled for today at 1030AM with Dr. Posey Rea.

## 2012-08-14 NOTE — Assessment & Plan Note (Signed)
added Sudafed Last day of Predn taper today

## 2012-08-14 NOTE — Progress Notes (Signed)
   Subjective:    Patient ID: [name], female    DOB: 10-07-1936, 76 y.o.   MRN: [id number]  HPI  C/o ST, hoarseness -- overall 50 % better. C/o post-nasal drainage  Review of Systems  Constitutional: Negative for diaphoresis, activity change, appetite change, fatigue and unexpected weight change.  HENT: Positive for congestion, sore throat and voice change. Negative for hearing loss, nosebleeds, facial swelling, sneezing, trouble swallowing, neck pain, sinus pressure and tinnitus.   Eyes: Negative.   Respiratory: Negative for apnea, cough, choking, chest tightness and stridor.   Cardiovascular: Negative for palpitations and leg swelling.  Gastrointestinal: Negative.   Genitourinary: Negative.   Musculoskeletal: Negative for back pain, joint swelling, arthralgias and gait problem.  Skin: Negative for color change, pallor and wound.  Neurological: Negative.   Hematological: Negative for adenopathy. Does not bruise/bleed easily.  Psychiatric/Behavioral: Negative.        Objective:   Physical Exam  Vitals reviewed. Constitutional: She is oriented to person, place, and time. She appears well-developed and well-nourished.  Non-toxic appearance. She does not have a sickly appearance. She does not appear ill. No distress.  HENT:  Head: Normocephalic and atraumatic.  Mouth/Throat: Oropharynx is clear and moist. No oropharyngeal exudate.       Slight hoarsness and a slight erythema, no thrush  Eyes: Conjunctivae normal are normal. Right eye exhibits no discharge. Left eye exhibits no discharge. No scleral icterus.  Neck: Normal range of motion. Neck supple. No JVD present. No tracheal deviation present. No thyromegaly present.  Cardiovascular: Normal rate, regular rhythm, normal heart sounds and intact distal pulses.  Exam reveals no gallop and no friction rub.   No murmur heard. Pulmonary/Chest: Effort normal and breath sounds normal. No stridor. No respiratory distress. She has no  wheezes. She has no rales. She exhibits no tenderness.  Abdominal: Soft. Bowel sounds are normal. She exhibits no distension and no mass. There is no tenderness. There is no rebound and no guarding.  Musculoskeletal: Normal range of motion. She exhibits no edema and no tenderness.  Lymphadenopathy:    She has no cervical adenopathy.  Neurological: She is oriented to person, place, and time.  Skin: Skin is warm and dry. No rash noted. She is not diaphoretic. No erythema. No pallor.  Psychiatric: She has a normal mood and affect. Her behavior is normal. Judgment and thought content normal.          Assessment & Plan:

## 2012-08-21 ENCOUNTER — Telehealth: Payer: Self-pay | Admitting: Internal Medicine

## 2012-08-21 DIAGNOSIS — J392 Other diseases of pharynx: Secondary | ICD-10-CM

## 2012-08-21 NOTE — Telephone Encounter (Signed)
ST x 3 wks Willl sch ENT consult Done

## 2012-08-25 ENCOUNTER — Telehealth: Payer: Self-pay | Admitting: Internal Medicine

## 2012-08-25 NOTE — Telephone Encounter (Signed)
The ref has been in the works. Gavin Pound will call Valarie Farace

## 2012-08-25 NOTE — Telephone Encounter (Signed)
Caller: Jozi/Patient; Patient Name: [name]; PCP: Sonda Primes (Adults only); Best Callback Phone Number: [phone].  Patient calling in followup regarding referral to throat specialist.  States Dr. Posey Rea told her 08/21/12 that he was going to refer her to someone regarding ongoing hoarseness and sore throat,  but has not heard from office.  Declines new triage; states issue has been present x 4 weeks and wants referral as soon as possible.  Per Epic, Dr. Posey Rea has ordered the referral; RN unable to see if referral completed.  Info to office for staff follow up/callback.   May reach patient [phone].

## 2012-08-26 DIAGNOSIS — Z23 Encounter for immunization: Secondary | ICD-10-CM | POA: Diagnosis not present

## 2012-08-29 DIAGNOSIS — J387 Other diseases of larynx: Secondary | ICD-10-CM | POA: Diagnosis not present

## 2012-08-29 DIAGNOSIS — R490 Dysphonia: Secondary | ICD-10-CM | POA: Diagnosis not present

## 2012-08-29 DIAGNOSIS — K219 Gastro-esophageal reflux disease without esophagitis: Secondary | ICD-10-CM | POA: Diagnosis not present

## 2012-09-06 ENCOUNTER — Ambulatory Visit (INDEPENDENT_AMBULATORY_CARE_PROVIDER_SITE_OTHER): Payer: Medicare Other | Admitting: Obstetrics and Gynecology

## 2012-09-06 DIAGNOSIS — N6019 Diffuse cystic mastopathy of unspecified breast: Secondary | ICD-10-CM

## 2012-09-06 DIAGNOSIS — M8580 Other specified disorders of bone density and structure, unspecified site: Secondary | ICD-10-CM

## 2012-09-06 DIAGNOSIS — M899 Disorder of bone, unspecified: Secondary | ICD-10-CM

## 2012-09-06 DIAGNOSIS — M949 Disorder of cartilage, unspecified: Secondary | ICD-10-CM | POA: Diagnosis not present

## 2012-09-06 NOTE — Patient Instructions (Addendum)
Return in 6 months

## 2012-09-06 NOTE — Progress Notes (Signed)
Patient came back to see me today for followup breast exam. We have been seeing her every 6 months because of very lumpy breast tissue making self-exam very difficult. She currently is not having mastodynia. She has had no nipple discharge. She does not do self exam because of lumpy tissue. She had a normal mammogram this Summer. She just had a bone density. She is on drug holiday. Her bone density was stable. She works out daily. She takes calcium and vitamin D. She has had no fractures.  Exam: [name] present. Both her breasts were carefully examined in the sitting and lying position. There are no skin changes. There are no masses or dominant lesions.  Assessment: #1. Fibrocystic breasts #2. Osteopenia on drug holiday from biphosphonate's.  Plan: Continue yearly mammogram. Annual exam in 6 months. Continue drug holiday from biphosphonate's.

## 2012-09-07 DIAGNOSIS — M779 Enthesopathy, unspecified: Secondary | ICD-10-CM | POA: Diagnosis not present

## 2012-09-07 DIAGNOSIS — L84 Corns and callosities: Secondary | ICD-10-CM | POA: Diagnosis not present

## 2012-09-20 ENCOUNTER — Telehealth: Payer: Self-pay | Admitting: Internal Medicine

## 2012-09-20 MED ORDER — LORAZEPAM 0.5 MG PO TABS: 0.5000 mg | ORAL_TABLET | Freq: Two times a day (BID) | ORAL | Status: DC

## 2012-09-20 NOTE — Telephone Encounter (Signed)
Caller: [name]/Patient; Patient Name: [name]; PCP: Sonda Primes (Adults only); [facility] Callback Phone Number: [phone]; Call regarding: Lorazepam; last OV 08/14/12; said she was asked if she needed a new rx, but she thought she had plenty of refills; pharmacy has sent over 2 requests; said she is going out of town and needs a new rx before she leaves 09/21/12; uses [facility]

## 2012-09-20 NOTE — Telephone Encounter (Signed)
I phoned it in Thx

## 2012-12-29 ENCOUNTER — Other Ambulatory Visit: Payer: Self-pay | Admitting: Internal Medicine

## 2013-01-18 ENCOUNTER — Other Ambulatory Visit (INDEPENDENT_AMBULATORY_CARE_PROVIDER_SITE_OTHER): Payer: Medicare Other

## 2013-01-18 DIAGNOSIS — K219 Gastro-esophageal reflux disease without esophagitis: Secondary | ICD-10-CM | POA: Diagnosis not present

## 2013-01-18 DIAGNOSIS — K589 Irritable bowel syndrome without diarrhea: Secondary | ICD-10-CM | POA: Diagnosis not present

## 2013-01-18 DIAGNOSIS — J309 Allergic rhinitis, unspecified: Secondary | ICD-10-CM

## 2013-01-18 DIAGNOSIS — E039 Hypothyroidism, unspecified: Secondary | ICD-10-CM

## 2013-01-18 DIAGNOSIS — I10 Essential (primary) hypertension: Secondary | ICD-10-CM

## 2013-01-18 DIAGNOSIS — E782 Mixed hyperlipidemia: Secondary | ICD-10-CM

## 2013-01-18 DIAGNOSIS — Z0000 Encounter for general adult medical examination without abnormal findings: Secondary | ICD-10-CM

## 2013-01-18 DIAGNOSIS — E785 Hyperlipidemia, unspecified: Secondary | ICD-10-CM | POA: Diagnosis not present

## 2013-01-18 LAB — CBC WITH DIFFERENTIAL/PLATELET
Basophils Absolute: 0 10*3/uL (ref 0.0–0.1)
Basophils Relative: 1 % (ref 0.0–3.0)
Eosinophils Absolute: 0.2 10*3/uL (ref 0.0–0.7)
Eosinophils Relative: 5 % (ref 0.0–5.0)
HCT: 39.8 % (ref 36.0–46.0)
Hemoglobin: 13.5 g/dL (ref 12.0–15.0)
Lymphocytes Relative: 22.4 % (ref 12.0–46.0)
Lymphs Abs: 1.1 10*3/uL (ref 0.7–4.0)
MCHC: 33.8 g/dL (ref 30.0–36.0)
MCV: 92.8 fl (ref 78.0–100.0)
Monocytes Absolute: 0.5 10*3/uL (ref 0.1–1.0)
Monocytes Relative: 10.2 % (ref 3.0–12.0)
Neutro Abs: 3.1 10*3/uL (ref 1.4–7.7)
Neutrophils Relative %: 61.4 % (ref 43.0–77.0)
Platelets: 260 10*3/uL (ref 150.0–400.0)
RBC: 4.3 Mil/uL (ref 3.87–5.11)
RDW: 13.3 % (ref 11.5–14.6)
WBC: 5 10*3/uL (ref 4.5–10.5)

## 2013-01-18 LAB — URINALYSIS
Bilirubin Urine: NEGATIVE
Hgb urine dipstick: NEGATIVE
Ketones, ur: NEGATIVE
Leukocytes, UA: NEGATIVE
Nitrite: NEGATIVE
Specific Gravity, Urine: 1.015 (ref 1.000–1.030)
Total Protein, Urine: NEGATIVE
Urine Glucose: NEGATIVE
Urobilinogen, UA: 0.2 (ref 0.0–1.0)
pH: 6.5 (ref 5.0–8.0)

## 2013-01-18 LAB — COMPREHENSIVE METABOLIC PANEL
ALT: 26 U/L (ref 0–35)
AST: 30 U/L (ref 0–37)
Albumin: 3.9 g/dL (ref 3.5–5.2)
Alkaline Phosphatase: 41 U/L (ref 39–117)
BUN: 17 mg/dL (ref 6–23)
CO2: 28 mEq/L (ref 19–32)
Calcium: 9 mg/dL (ref 8.4–10.5)
Chloride: 106 mEq/L (ref 96–112)
Creatinine, Ser: 0.7 mg/dL (ref 0.4–1.2)
GFR: 85.4 mL/min (ref >60.00)
Glucose, Bld: 101 mg/dL — ABNORMAL HIGH (ref 70–99)
Potassium: 4.7 mEq/L (ref 3.5–5.1)
Sodium: 140 mEq/L (ref 135–145)
Total Bilirubin: 0.9 mg/dL (ref 0.3–1.2)
Total Protein: 7.2 g/dL (ref 6.0–8.3)

## 2013-01-18 LAB — LIPID PANEL
Cholesterol: 172 mg/dL (ref 0–200)
HDL: 61.8 mg/dL (ref >39.00)
LDL Cholesterol: 96 mg/dL (ref 0–99)
Total CHOL/HDL Ratio: 3
Triglycerides: 70 mg/dL (ref 0.0–149.0)
VLDL: 14 mg/dL (ref 0.0–40.0)

## 2013-01-18 LAB — TSH: TSH: 1.64 u[IU]/mL (ref 0.35–5.50)

## 2013-01-24 ENCOUNTER — Encounter: Payer: Self-pay | Admitting: Internal Medicine

## 2013-01-24 ENCOUNTER — Ambulatory Visit (INDEPENDENT_AMBULATORY_CARE_PROVIDER_SITE_OTHER): Payer: Medicare Other | Admitting: Internal Medicine

## 2013-01-24 VITALS — BP 110/66 | HR 68 | Temp 96.9°F | Resp 16 | Wt 121.0 lb

## 2013-01-24 DIAGNOSIS — E039 Hypothyroidism, unspecified: Secondary | ICD-10-CM | POA: Diagnosis not present

## 2013-01-24 DIAGNOSIS — G4700 Insomnia, unspecified: Secondary | ICD-10-CM

## 2013-01-24 DIAGNOSIS — K589 Irritable bowel syndrome without diarrhea: Secondary | ICD-10-CM | POA: Diagnosis not present

## 2013-01-24 DIAGNOSIS — R059 Cough, unspecified: Secondary | ICD-10-CM

## 2013-01-24 DIAGNOSIS — K219 Gastro-esophageal reflux disease without esophagitis: Secondary | ICD-10-CM | POA: Diagnosis not present

## 2013-01-24 DIAGNOSIS — R05 Cough: Secondary | ICD-10-CM | POA: Diagnosis not present

## 2013-01-24 DIAGNOSIS — R202 Paresthesia of skin: Secondary | ICD-10-CM

## 2013-01-24 DIAGNOSIS — R209 Unspecified disturbances of skin sensation: Secondary | ICD-10-CM

## 2013-01-24 HISTORY — DX: Insomnia, unspecified: G47.00

## 2013-01-24 MED ORDER — AZITHROMYCIN 250 MG PO TABS: ORAL_TABLET | ORAL | Status: DC

## 2013-01-24 MED ORDER — LORAZEPAM 0.5 MG PO TABS: 0.5000 mg | ORAL_TABLET | Freq: Two times a day (BID) | ORAL | Status: DC

## 2013-01-24 MED ORDER — AMOXICILLIN 500 MG PO CAPS: ORAL_CAPSULE | ORAL | Status: DC

## 2013-01-24 MED ORDER — EPINEPHRINE 0.3 MG/0.3ML IJ DEVI: 0.3000 mg | Freq: Once | INTRAMUSCULAR | Status: DC

## 2013-01-24 NOTE — Assessment & Plan Note (Signed)
Better  

## 2013-01-24 NOTE — Progress Notes (Signed)
   Subjective:    HPI  F/u bloating - resolved on gluten free diet F/u HTN, dyslipidemia, allergies  Wt Readings from Last 3 Encounters:  01/24/13 121 lb (54.885 kg)  08/14/12 120 lb (54.432 kg)  07/19/12 120 lb (54.432 kg)   BP Readings from Last 3 Encounters:  01/24/13 110/66  08/14/12 122/82  08/07/12 110/66       Review of Systems  Constitutional: Negative for diaphoresis, activity change, appetite change, fatigue and unexpected weight change.  HENT: Positive for congestion, sore throat and voice change. Negative for hearing loss, nosebleeds, facial swelling, sneezing, trouble swallowing, neck pain, sinus pressure and tinnitus.   Eyes: Negative.   Respiratory: Negative for apnea, cough, choking, chest tightness and stridor.   Cardiovascular: Negative for palpitations and leg swelling.  Gastrointestinal: Negative.   Genitourinary: Negative.   Musculoskeletal: Negative for back pain, joint swelling, arthralgias and gait problem.  Skin: Negative for color change, pallor and wound.  Neurological: Negative.   Hematological: Negative for adenopathy. Does not bruise/bleed easily.  Psychiatric/Behavioral: Negative.        Objective:   Physical Exam  Vitals reviewed. Constitutional: She is oriented to person, place, and time. She appears well-developed and well-nourished.  Non-toxic appearance. She does not have a sickly appearance. She does not appear ill. No distress.  HENT:  Head: Normocephalic and atraumatic.  Mouth/Throat: Oropharynx is clear and moist. No oropharyngeal exudate.  Slight hoarsness and a slight erythema, no thrush  Eyes: Conjunctivae are normal. Right eye exhibits no discharge. Left eye exhibits no discharge. No scleral icterus.  Neck: Normal range of motion. Neck supple. No JVD present. No tracheal deviation present. No thyromegaly present.  Cardiovascular: Normal rate, regular rhythm, normal heart sounds and intact distal pulses.  Exam reveals no  gallop and no friction rub.   No murmur heard. Pulmonary/Chest: Effort normal and breath sounds normal. No stridor. No respiratory distress. She has no wheezes. She has no rales. She exhibits no tenderness.  Abdominal: Soft. Bowel sounds are normal. She exhibits no distension and no mass. There is no tenderness. There is no rebound and no guarding.  Musculoskeletal: Normal range of motion. She exhibits no edema and no tenderness.  Lymphadenopathy:    She has no cervical adenopathy.  Neurological: She is oriented to person, place, and time.  Skin: Skin is warm and dry. No rash noted. She is not diaphoretic. No erythema. No pallor.  Psychiatric: She has a normal mood and affect. Her behavior is normal. Judgment and thought content normal.   Lab Results  Component Value Date   WBC 5.0 01/18/2013   HGB 13.5 01/18/2013   HCT 39.8 01/18/2013   PLT 260.0 01/18/2013   GLUCOSE 101* 01/18/2013   CHOL 172 01/18/2013   TRIG 70.0 01/18/2013   HDL 61.80 01/18/2013   LDLCALC 96 01/18/2013   ALT 26 01/18/2013   AST 30 01/18/2013   NA 140 01/18/2013   K 4.7 01/18/2013   CL 106 01/18/2013   CREATININE 0.7 01/18/2013   BUN 17 01/18/2013   CO2 28 01/18/2013   TSH 1.64 01/18/2013   HGBA1C 6.0 12/05/2006          Assessment & Plan:

## 2013-01-24 NOTE — Assessment & Plan Note (Signed)
Continue with current prescription therapy as reflected on the Med list.  

## 2013-01-24 NOTE — Patient Instructions (Addendum)
Malarone 

## 2013-01-24 NOTE — Assessment & Plan Note (Signed)
2013 - much better on gluten free diet

## 2013-02-23 ENCOUNTER — Ambulatory Visit (INDEPENDENT_AMBULATORY_CARE_PROVIDER_SITE_OTHER): Payer: Medicare Other | Admitting: Internal Medicine

## 2013-02-23 DIAGNOSIS — Z23 Encounter for immunization: Secondary | ICD-10-CM

## 2013-02-23 DIAGNOSIS — Z0000 Encounter for general adult medical examination without abnormal findings: Secondary | ICD-10-CM

## 2013-02-23 DIAGNOSIS — Z789 Other specified health status: Secondary | ICD-10-CM | POA: Diagnosis not present

## 2013-02-23 MED ORDER — ATOVAQUONE-PROGUANIL HCL 250-100 MG PO TABS: 1.0000 | ORAL_TABLET | Freq: Every day | ORAL | Status: DC

## 2013-02-23 MED ORDER — AZITHROMYCIN 250 MG PO TABS: ORAL_TABLET | ORAL | Status: DC

## 2013-02-23 NOTE — Progress Notes (Signed)
[facility] NOTE  RFV: pretravel counseling and vaccines for [place] Subjective:    Patient ID: [name], female    DOB: 01/10/1936, 77 y.o.   MRN: [id number]  HPI [name] is a 77yo F with hx of multinodular goiter, GERD, HLD who is going on a 2 wk trip with her husband, group tour to [place]. Itinerary includes flying out of [place] to [place] then [place] town leaving may 4th; Spend 4 days in [place] town, [place] days [place], and [place] [employer].  Previously had hav, hbv 23yrs ago tetanus in the last 60yrs; received flu vaccine last year Travelled to [place], [place], [place], [place]  [id number] Current Outpatient Prescriptions on File Prior to Visit  Medication Sig Dispense Refill  . amoxicillin (AMOXIL) 500 MG capsule Take 4 tabs 1 hour prior to dental work  4 capsule  6  . aspirin 81 MG tablet Take 81 mg by mouth daily.        . budesonide (RHINOCORT AQUA) 32 MCG/ACT nasal spray Place 1-2 sprays into the nose daily as needed.  1 Bottle  11  . Calcium Carbonate-Vitamin D (CALCIUM-VITAMIN D) 500-200 MG-UNIT per tablet Take 3 tablets by mouth daily.       . Cetirizine HCl (ZYRTEC ALLERGY) 10 MG CAPS Take 1 capsule by mouth daily.        . Cholecalciferol (VITAMIN D) 400 UNITS capsule Take 400 Units by mouth daily.        [name] Dextromethorphan-Guaifenesin (MUCINEX DM) 30-600 MG TB12 1 po bid prn  28 each  0  . EPINEPHrine (EPIPEN) 0.3 mg/0.3 mL DEVI Inject 0.3 mLs (0.3 mg total) into the muscle once.  1 Device  1  . estradiol (ESTRACE) 0.1 MG/GM vaginal cream Place 0.25 Applicatorfuls vaginally daily.  42.5 g  5  . ipratropium (ATROVENT) 0.06 % nasal spray Place 2 sprays into the nose 2 (two) times daily.  45 mL  2  . lansoprazole (PREVACID) 30 MG capsule       . LORazepam (ATIVAN) 0.5 MG tablet Take 1 tablet (0.5 mg total) by mouth 2 (two) times daily.  180 tablet  1  . mometasone (NASONEX) 50 MCG/ACT nasal spray Place 2 sprays into the nose daily.      .  mupirocin ointment (BACTROBAN) 2 %       . pseudoephedrine (SUDAFED) 30 MG tablet Take 1-2 tablets (30-60 mg total) by mouth 2 (two) times daily as needed for congestion.  60 tablet  1  . SALINE NASAL SPRAY NA Place into the nose.      . simvastatin (ZOCOR) 20 MG tablet TAKE (2) TABLETS DAILY.  180 tablet  2  . SYNTHROID 50 MCG tablet Take 1 tablet (50 mcg total) by mouth daily.  30 tablet  12  . triamcinolone cream (KENALOG) 0.5 % Apply topically 3 (three) times daily as needed.       No current facility-administered medications on file prior to visit.     Review of Systems     Objective:   Physical Exam        Assessment & Plan:  1) pre-travel vaccine = will give typhoid inj,   2) traveller's diarrhea = handout plus give rx for azithromycin  3) malaria proph = will start 2 days prior to [place] on [place] to take daily #13 tabs.

## 2013-03-01 ENCOUNTER — Telehealth: Payer: Self-pay | Admitting: Internal Medicine

## 2013-03-01 MED ORDER — DEXLANSOPRAZOLE 60 MG PO CPDR: 60.0000 mg | DELAYED_RELEASE_CAPSULE | Freq: Every day | ORAL | Status: DC

## 2013-03-01 NOTE — Telephone Encounter (Signed)
Pt states that she has been taking prevacid 30mg  BID for quite some time. States the medication is not working as well as it used to. Pt reports she is having some heartburn, a little reflux, and it feels like there is phlegm in the back of his throat. Pt wants to know if she can try something else for reflux. Please advise.

## 2013-03-01 NOTE — Telephone Encounter (Signed)
Dexilant 60 mg daily instead of Prevacid

## 2013-03-01 NOTE — Telephone Encounter (Signed)
Spoke with pt and she is aware, script sent to the pharmacy. 

## 2013-03-08 ENCOUNTER — Encounter (HOSPITAL_COMMUNITY): Payer: Self-pay | Admitting: Cardiology

## 2013-03-14 ENCOUNTER — Encounter: Payer: Self-pay | Admitting: Gynecology

## 2013-03-14 ENCOUNTER — Ambulatory Visit (INDEPENDENT_AMBULATORY_CARE_PROVIDER_SITE_OTHER): Payer: Medicare Other | Admitting: Gynecology

## 2013-03-14 VITALS — BP 110/70 | Ht 61.0 in | Wt 121.0 lb

## 2013-03-14 DIAGNOSIS — D259 Leiomyoma of uterus, unspecified: Secondary | ICD-10-CM | POA: Diagnosis not present

## 2013-03-14 DIAGNOSIS — M899 Disorder of bone, unspecified: Secondary | ICD-10-CM | POA: Diagnosis not present

## 2013-03-14 DIAGNOSIS — N952 Postmenopausal atrophic vaginitis: Secondary | ICD-10-CM | POA: Diagnosis not present

## 2013-03-14 DIAGNOSIS — M8580 Other specified disorders of bone density and structure, unspecified site: Secondary | ICD-10-CM

## 2013-03-14 MED ORDER — ESTRADIOL 0.1 MG/GM VA CREA: 1.0000 g | TOPICAL_CREAM | Freq: Every day | VAGINAL | Status: DC

## 2013-03-14 NOTE — Progress Notes (Signed)
[name] Jan 25, 1936 [id number]        77 y.o.  [id number] for followup exam.  Former patient of Dr. Eda Paschal. Several issues noted below.  Past medical history,surgical history, medications, allergies, family history and social history were all reviewed and documented in the EPIC chart. ROS:  Was performed and pertinent positives and negatives are included in the history.  Exam: Kim assistant Filed Vitals:   03/14/13 0939  BP: 110/70  Height: 5\' 1"  (1.549 m)  Weight: 121 lb (54.885 kg)   General appearance  Normal Skin grossly normal Head/Neck normal with no cervical or supraclavicular adenopathy thyroid normal Lungs  clear Cardiac RR, without RMG Abdominal  soft, nontender, without masses, organomegaly or hernia Breasts  examined lying and sitting without masses, retractions, discharge or axillary adenopathy. Pelvic  Ext/BUS/vagina  normal with atrophic changes  Cervix  normal with atrophic changes  Uterus  axial to anteverted, normal size, shape and contour, midline and mobile nontender   Adnexa  Without masses or tenderness    Anus and perineum  normal   Rectovaginal  normal sphincter tone without palpated masses or tenderness.    Assessment/Plan:  77 y.o. [id number] female for annual exam.   1. Atrophic vaginitis. Patient uses Estrace cream twice weekly and does well with this. I reviewed the issues of absorption and risks to include stroke heart attack DVT breast and uterine stimulation. Patient's comfortable with continuing and I refilled her times a year. Patient does report any vaginal bleeding. 2. Osteopenia. DEXA 06/2012 with T score -2.2. History of prior Fosamax use for a number of years but has been off of this now for 5 years. Bone density stable per Dr. Verl Dicker note. We'll plan repeat 2015 at 2 year interval. Increase calcium vitamin D reviewed. 3. History of leiomyoma. Ultrasound 2012 showed several small myomas the largest 26 mm. Exam today is normal. We'll continue  to monitor with yearly exams. 4. Pap smear 2013. No Pap smear done today. No history of abnormal Pap smears previously. Reviewed current screening guidelines options to stop screening altogether she is over the age of 29 versus less frequent screening intervals reviewed. We'll readdress on an annual basis. 5. Colonoscopy 2013. Repeat at their recommended interval. 6. Health maintenance. Sees Dr. Posey Rea routinely and no blood work was done. Followup in 6 months for breast exam as this is her routine due to a history of dense breasts and she prefers every 6 month exams.    Dara Lords MD, 10:17 AM 03/14/2013

## 2013-03-14 NOTE — Patient Instructions (Signed)
Followup in 6 months for breast examination. Otherwise 1 year for full examination

## 2013-03-15 LAB — URINALYSIS W MICROSCOPIC + REFLEX CULTURE
Bacteria, UA: NONE SEEN
Bilirubin Urine: NEGATIVE
Casts: NONE SEEN
Crystals: NONE SEEN
Glucose, UA: NEGATIVE mg/dL
Hgb urine dipstick: NEGATIVE
Ketones, ur: NEGATIVE mg/dL
Leukocytes, UA: NEGATIVE
Nitrite: NEGATIVE
Protein, ur: NEGATIVE mg/dL
Specific Gravity, Urine: 1.025 (ref 1.005–1.030)
Squamous Epithelial / LPF: NONE SEEN
Urobilinogen, UA: 0.2 mg/dL (ref 0.0–1.0)
pH: 5 (ref 5.0–8.0)

## 2013-03-26 DIAGNOSIS — L84 Corns and callosities: Secondary | ICD-10-CM | POA: Diagnosis not present

## 2013-03-26 DIAGNOSIS — M779 Enthesopathy, unspecified: Secondary | ICD-10-CM | POA: Diagnosis not present

## 2013-04-17 ENCOUNTER — Ambulatory Visit
Admission: RE | Admit: 2013-04-17 | Discharge: 2013-04-17 | Disposition: A | Discharge status: Home / Self Care | Payer: Medicare Other | Source: Ambulatory Visit | Attending: Surgery | Admitting: Surgery

## 2013-04-17 ENCOUNTER — Other Ambulatory Visit (INDEPENDENT_AMBULATORY_CARE_PROVIDER_SITE_OTHER): Payer: Self-pay | Admitting: Surgery

## 2013-04-17 DIAGNOSIS — E042 Nontoxic multinodular goiter: Secondary | ICD-10-CM | POA: Diagnosis not present

## 2013-04-18 LAB — TSH: TSH: 2.636 u[IU]/mL (ref 0.350–4.500)

## 2013-04-19 ENCOUNTER — Encounter (HOSPITAL_COMMUNITY): Payer: Self-pay

## 2013-04-19 ENCOUNTER — Ambulatory Visit (HOSPITAL_COMMUNITY)
Admission: RE | Admit: 2013-04-19 | Discharge: 2013-04-19 | Disposition: A | Discharge status: Home / Self Care | Payer: Medicare Other | Source: Ambulatory Visit | Attending: Internal Medicine | Admitting: Internal Medicine

## 2013-04-19 ENCOUNTER — Ambulatory Visit (HOSPITAL_BASED_OUTPATIENT_CLINIC_OR_DEPARTMENT_OTHER)
Admission: RE | Admit: 2013-04-19 | Discharge: 2013-04-19 | Disposition: A | Discharge status: Home / Self Care | Payer: Medicare Other | Source: Ambulatory Visit | Attending: Internal Medicine | Admitting: Internal Medicine

## 2013-04-19 ENCOUNTER — Other Ambulatory Visit (HOSPITAL_COMMUNITY): Payer: Self-pay | Admitting: Internal Medicine

## 2013-04-19 VITALS — BP 120/70 | HR 80 | Wt 124.8 lb

## 2013-04-19 DIAGNOSIS — I519 Heart disease, unspecified: Secondary | ICD-10-CM

## 2013-04-19 DIAGNOSIS — K219 Gastro-esophageal reflux disease without esophagitis: Secondary | ICD-10-CM | POA: Diagnosis not present

## 2013-04-19 DIAGNOSIS — I059 Rheumatic mitral valve disease, unspecified: Secondary | ICD-10-CM | POA: Insufficient documentation

## 2013-04-19 DIAGNOSIS — I359 Nonrheumatic aortic valve disorder, unspecified: Secondary | ICD-10-CM | POA: Insufficient documentation

## 2013-04-19 DIAGNOSIS — I350 Nonrheumatic aortic (valve) stenosis: Secondary | ICD-10-CM

## 2013-04-19 DIAGNOSIS — I10 Essential (primary) hypertension: Secondary | ICD-10-CM | POA: Diagnosis not present

## 2013-04-19 DIAGNOSIS — E782 Mixed hyperlipidemia: Secondary | ICD-10-CM | POA: Diagnosis not present

## 2013-04-19 DIAGNOSIS — I509 Heart failure, unspecified: Secondary | ICD-10-CM | POA: Diagnosis not present

## 2013-04-19 NOTE — Progress Notes (Signed)
  Echocardiogram 2D Echocardiogram has been performed.  [name] 04/19/2013, 10:43 AM

## 2013-04-19 NOTE — Assessment & Plan Note (Signed)
Reviewed echo with her. Appears very mild. Will repeat in 1 year.

## 2013-04-19 NOTE — Assessment & Plan Note (Signed)
Lipids look great. Continue simva and exercise.

## 2013-04-19 NOTE — Assessment & Plan Note (Signed)
Blood pressure well controlled. Continue current regimen.  

## 2013-04-19 NOTE — Progress Notes (Signed)
Patient ID: [name], female   DOB: 1936-10-02, 77 y.o.   MRN: [id number] HPI:  [name] is a 77 year old woman with a history of hyperlipidemia, mild mitral valve prolapse with mild mitral regurgitation (echo 12/11).  She has undergone CT angiogram at the [facility] few years ago which was totally normal.    She returns today for routine followup.  She just came back from [place] in [place]. [place]. She is doing great.  She continues to walk, exercise with a trainer and do Pilates without any chest pain or dyspnea.  She has not had any palpitations.  Preliminary echo today (i reviewed personally) EF 60% calcified AoV appears slighty worse than 2011 but still mild (gradient not available yet)  Lab Results  Component Value Date   CHOL 172 01/18/2013   HDL 61.80 01/18/2013   LDLCALC 96 01/18/2013   TRIG 70.0 01/18/2013   CHOLHDL 3 01/18/2013       ROS: All systems negative except as listed in HPI, PMH and Problem List.  Past Medical History  Diagnosis Date  . GERD (gastroesophageal reflux disease)   . Osteoporosis   . Hyperlipidemia   . OA (osteoarthritis)   . Allergic rhinitis   . Baker's cyst     Left-Dr. Aluisio  . Torn meniscus     bilateral  . MVP (mitral valve prolapse)     Antibiotics required for dental procedures  . Cataract     Dr. Dione Booze  . Overactive bladder   . Atrophic vaginitis   . Fibroid   . Thyroid nodule   . Heart murmur   . Thyroid disease   . IBS (irritable bowel syndrome)     Current Outpatient Prescriptions  Medication Sig Dispense Refill  . aspirin 81 MG tablet Take 81 mg by mouth daily.        [name] atovaquone-proguanil (MALARONE) 250-100 MG TABS Take 1 tablet by mouth daily. Start taking on 5/12, take 1 tab dailyl until complete with meal  13 tablet  0  . azithromycin (ZITHROMAX Z-PAK) 250 MG tablet As directed  6 each  0  . Calcium Carbonate-Vitamin D (CALCIUM-VITAMIN D) 500-200 MG-UNIT per tablet Take 3 tablets by mouth daily.       . Cetirizine HCl  (ZYRTEC ALLERGY) 10 MG CAPS Take 1 capsule by mouth daily.        . Cholecalciferol (VITAMIN D) 400 UNITS capsule Take 400 Units by mouth daily.        [name] dexlansoprazole (DEXILANT) 60 MG capsule Take 1 capsule (60 mg total) by mouth daily.  30 capsule  3  . Dextromethorphan-Guaifenesin (MUCINEX DM) 30-600 MG TB12 1 po bid prn  28 each  0  . EPINEPHrine (EPIPEN) 0.3 mg/0.3 mL DEVI Inject 0.3 mLs (0.3 mg total) into the muscle once.  1 Device  1  . estradiol (ESTRACE) 0.1 MG/GM vaginal cream Place 0.25 Applicatorfuls vaginally daily.  42.5 g  5  . LORazepam (ATIVAN) 0.5 MG tablet Take 1 tablet (0.5 mg total) by mouth 2 (two) times daily.  180 tablet  1  . mupirocin ointment (BACTROBAN) 2 %       . pseudoephedrine (SUDAFED) 30 MG tablet Take 1-2 tablets (30-60 mg total) by mouth 2 (two) times daily as needed for congestion.  60 tablet  1  . SALINE NASAL SPRAY NA Place into the nose.      . simvastatin (ZOCOR) 20 MG tablet TAKE (2) TABLETS DAILY.  180 tablet  2  .  SYNTHROID 50 MCG tablet Take 1 tablet (50 mcg total) by mouth daily.  30 tablet  12   No current facility-administered medications for this encounter.     PHYSICAL EXAM: Filed Vitals:   04/19/13 1000  BP: 120/70  Pulse: 80   General:  Well appearing. No resp difficulty HEENT: normal Neck: supple. JVP flat. Carotids 2+ bilaterally; no bruits. No lymphadenopathy or thryomegaly appreciated. Cor: PMI normal. Regular rate & rhythm. No rubs, gallops. Minimal AS murmur. S2 crisp Lungs: clear Abdomen: soft, nontender, nondistended. No hepatosplenomegaly. No bruits or masses. Good bowel sounds. Extremities: no cyanosis, clubbing, rash, edema Neuro: alert & orientedx3, cranial nerves grossly intact. Moves all 4 extremities w/o difficulty. Affect pleasant.   ASSESSMENT & PLAN:

## 2013-04-19 NOTE — Patient Instructions (Addendum)
We will contact you in 1 year to schedule your next appointment and echocardiogram  

## 2013-05-04 DIAGNOSIS — H532 Diplopia: Secondary | ICD-10-CM | POA: Diagnosis not present

## 2013-05-04 DIAGNOSIS — Z961 Presence of intraocular lens: Secondary | ICD-10-CM | POA: Diagnosis not present

## 2013-05-04 DIAGNOSIS — H04129 Dry eye syndrome of unspecified lacrimal gland: Secondary | ICD-10-CM | POA: Diagnosis not present

## 2013-05-08 ENCOUNTER — Ambulatory Visit (INDEPENDENT_AMBULATORY_CARE_PROVIDER_SITE_OTHER): Payer: Medicare Other | Admitting: Surgery

## 2013-05-08 ENCOUNTER — Encounter (INDEPENDENT_AMBULATORY_CARE_PROVIDER_SITE_OTHER): Payer: Self-pay | Admitting: Surgery

## 2013-05-08 VITALS — BP 102/63 | HR 66 | Temp 98.0°F

## 2013-05-08 DIAGNOSIS — E039 Hypothyroidism, unspecified: Secondary | ICD-10-CM

## 2013-05-08 DIAGNOSIS — E042 Nontoxic multinodular goiter: Secondary | ICD-10-CM | POA: Diagnosis not present

## 2013-05-08 NOTE — Patient Instructions (Signed)
Thyroid-Stimulating Hormone The amount of thyroid-stimulating hormone (TSH) or thyrotropin can be measured from a sample of blood. The TSH level can help diagnose thyroid gland or pituitary gland disorders, or monitor treatment of hypothyroidism and hyperthyroidism. TSH is produced by the pituitary gland, a tiny organ located below the brain. The pituitary gland is part of the body's feedback system to maintain stable levels of thyroid hormones released into the blood. Thyroid hormones help control the rate at which the body uses energy. The pituitary gland monitors the level of thyroid hormones released by the thyroid gland. The thyroid gland is a small butterfly-shaped gland that lies flat against the windpipe. If the thyroid gland does not release enough thyroid hormones, the pituitary gland detects the reduced thyroid hormone levels. The pituitary gland then makes more TSH to trigger the thyroid gland to produce more thyroid hormones. This increase in TSH is an effort to return the low thyroid hormones to normal levels. The increased TSH level is caused by the low thyroid hormone levels of an underactive thyroid (hypothyroidism). Symptoms of hypothyroidism include menstrual irregularities in women, weight gain, dry skin, constipation, cold intolerance, and fatigue. Rarely, a high TSH level can indicate a problem with the pituitary gland. A high TSH level could also occur when there is an insufficient level of thyroid hormone medication in individuals receiving that medication. If the thyroid gland releases too much thyroid hormones, the pituitary gland detects the increased thyroid hormone levels. The pituitary gland then makes less TSH to slow the thyroid gland from producing thyroid hormones. This decrease in TSH is an effort to return the increased thyroid hormones to normal levels. The decreased TSH level is caused by the excess thyroid hormone levels of an overactive thyroid gland (hyperthyroidism).  Symptoms associated with hyperthyroidism include rapid heart rate, weight loss, nervousness, hand tremors, irritated eyes, and difficulty sleeping. Rarely, a low TSH level can indicate a problem with the pituitary gland. PREPARATION FOR TEST No specific preparation is required for this blood test. A blood sample is obtained from a needle placed in a vein in your arm or from pricking the heel of an infant.  NORMAL FINDINGS  Adult: 0.5-5 milli-international Units/L (0.5-5 mIU/L)  Newborn: 3-20 milli-international Units/L (3-20 mIU/L)  Cord: 3-12 milli-international Units/L (3-12 mIU/L) Ranges for normal findings may vary among different laboratories and hospitals. You should always check with your doctor after having lab work or other tests done to discuss the meaning of your test results and whether your values are considered within normal limits. MEANING OF TEST  Your caregiver will go over the test results with you and discuss the importance and meaning of your results, as well as treatment options and the need for additional tests if necessary. OBTAINING THE TEST RESULTS It is your responsibility to obtain your test results. Ask the lab or department performing the test when and how you will get your results. Document Released: 12/10/2004 Document Revised: 02/07/2012 Document Reviewed: 10/27/2008 Exit[employer] Patient Information 2014 [employer].  

## 2013-05-08 NOTE — Progress Notes (Signed)
General Surgery [facility]  Visit Diagnoses: 1. Multiple thyroid nodules   2. HYPOTHYROIDISM     HISTORY: Patient is a 77 year old female who returns for follow-up of bilateral thyroid nodules. These have not been followed for several years it has remained clinically stable. At my request she underwent ultrasound on 04/17/2013. This was compared to her prior studies. There are bilateral thyroid nodules which all measuring less than 1 cm in size. These are stable to slightly decreased in size compared to prior studies. There are no worrisome features. There is no indication for biopsy.  TSH level is normal at 2.636 on her present dosage of Synthroid 50 mcg daily.  PERTINENT REVIEW OF SYSTEMS: Denies tremor. Denies palpitation. Denies compressive symptoms. Denies new masses.  EXAM: HEENT: normocephalic; pupils equal and reactive; sclerae clear; dentition good; mucous membranes moist NECK:  Slight bilateral thyroid nodularity without discrete or dominant mass; symmetric on extension; no palpable anterior or posterior cervical lymphadenopathy; no supraclavicular masses; no tenderness CHEST: clear to auscultation bilaterally without rales, rhonchi, or wheezes CARDIAC: regular rate and rhythm without significant murmur; peripheral pulses are full EXT:  non-tender without edema; no deformity NEURO: no gross focal deficits; no sign of tremor   IMPRESSION: Bilateral subcentimeter thyroid nodules, clinically stable  PLAN: The patient and I discussed the above findings and reviewed her most recent studies. I think at this point it is safe to allow her to continue follow-up with her primary care physician on an annual basis. She does not require additional ultrasound studies and less there is a change in her physical examination. She should have her TSH level monitored at least a newly. She will remain on her present dose of Synthroid.  There is no role at present for surgical  intervention. Patient will return for surgical care as needed.  Velora Heckler, MD, [facility] Office: [phone]

## 2013-05-16 DIAGNOSIS — L821 Other seborrheic keratosis: Secondary | ICD-10-CM | POA: Diagnosis not present

## 2013-05-16 DIAGNOSIS — I781 Nevus, non-neoplastic: Secondary | ICD-10-CM | POA: Diagnosis not present

## 2013-05-16 DIAGNOSIS — D239 Other benign neoplasm of skin, unspecified: Secondary | ICD-10-CM | POA: Diagnosis not present

## 2013-05-16 DIAGNOSIS — Z85828 Personal history of other malignant neoplasm of skin: Secondary | ICD-10-CM | POA: Diagnosis not present

## 2013-05-18 ENCOUNTER — Other Ambulatory Visit: Payer: Self-pay

## 2013-05-18 DIAGNOSIS — Z1231 Encounter for screening mammogram for malignant neoplasm of breast: Secondary | ICD-10-CM

## 2013-05-22 ENCOUNTER — Telehealth: Payer: Self-pay | Admitting: *Deleted

## 2013-05-22 NOTE — Telephone Encounter (Signed)
Kathlene November from Pharmacy called requesting additional medication for pt.  Pt forgot Lorazepam medication in [place] and she is now out of town.  Pt is requesting 15 additional tablets while she is gone.  Please advise Kathlene November at [phone]

## 2013-05-23 NOTE — Telephone Encounter (Signed)
Ok Thx 

## 2013-06-02 ENCOUNTER — Ambulatory Visit (INDEPENDENT_AMBULATORY_CARE_PROVIDER_SITE_OTHER): Payer: Medicare Other | Admitting: Family Medicine

## 2013-06-02 VITALS — BP 122/70 | HR 81 | Temp 97.9°F | Resp 18 | Ht 61.0 in | Wt 126.0 lb

## 2013-06-02 DIAGNOSIS — IMO0001 Reserved for inherently not codable concepts without codable children: Secondary | ICD-10-CM

## 2013-06-02 DIAGNOSIS — S40862A Insect bite (nonvenomous) of left upper arm, initial encounter: Secondary | ICD-10-CM

## 2013-06-02 DIAGNOSIS — W57XXXA Bitten or stung by nonvenomous insect and other nonvenomous arthropods, initial encounter: Secondary | ICD-10-CM

## 2013-06-02 MED ORDER — METHYLPREDNISOLONE ACETATE 80 MG/ML IJ SUSP
120.0000 mg | Freq: Once | INTRAMUSCULAR | Status: AC
  Administered 2013-06-02: 120 mg via INTRAMUSCULAR

## 2013-06-02 NOTE — Progress Notes (Signed)
[facility] [address], [place] [address] [phone]  Date:  06/02/2013   Name:  [name]   DOB:  December 14, 1935   MRN:  [id number]  PCP:  Sonda Primes, MD    Chief Complaint: Insect Bite   History of Present Illness:  [name] is a 77 y.o. very pleasant female patient who presents with the following:  Here today with an insect sting.  Shewas gardening yesterday when a yellowjacket stung her on the left arm.  She has developed some swelling, burning and pain around the area. No wheezing, hives, angioedema or SOB   No known history of osteoporosis, DM, etc.  She has been treated with IM depo- medrol in the past with success.   She is otherwise generally healthy, and has an epi- pen in her purse.   Patient Active Problem List   Diagnosis Date Noted  . Aortic stenosis 04/19/2013  . Insomnia 01/24/2013  . Cough 08/07/2012  . URI (upper respiratory infection) 08/03/2012  . Pruritus of skin 03/11/2012  . MVP (mitral valve prolapse)   . Multiple thyroid nodules 10/04/2011  . Herpes zoster 09/07/2011  . Essential hypertension, benign 07/27/2011  . Atrophic vaginitis   . Fibroid   . Well adult exam 03/15/2011  . Hyperlipemia, mixed 03/15/2011  . OVERACTIVE BLADDER 09/02/2010  . HYPOTHYROIDISM 03/03/2010  . CALF PAIN, LEFT 10/27/2009  . FLATULENCE-GAS-BLOATING 07/21/2009  . EAR PAIN 12/19/2008  . ALLERGIC RHINITIS 12/19/2008  . Irritable bowel syndrome 09/04/2008  . PERSONAL HX COLONIC POLYPS 09/04/2008  . GASTRITIS, CHRONIC 09/03/2008  . DUODENITIS, WITHOUT HEMORRHAGE 09/03/2008  . TIBIALIS TENDINITIS 03/20/2008  . OSTEOARTHRITIS 02/25/2008  . SWEATING 02/21/2008  . LACTOSE INTOLERANCE 11/20/2007  . Other and unspecified hyperlipidemia 11/20/2007  . GERD 11/20/2007  . OSTEOPOROSIS 11/20/2007    Past Medical History  Diagnosis Date  . GERD (gastroesophageal reflux disease)   . Osteoporosis   . Hyperlipidemia   . OA (osteoarthritis)   .  Allergic rhinitis   . Baker's cyst     Left-Dr. Aluisio  . Torn meniscus     bilateral  . MVP (mitral valve prolapse)     Antibiotics required for dental procedures  . Cataract     Dr. Dione Booze  . Overactive bladder   . Atrophic vaginitis   . Fibroid   . Thyroid nodule   . Heart murmur   . Thyroid disease   . IBS (irritable bowel syndrome)     Past Surgical History  Procedure Laterality Date  . Cataract extraction, bilateral    . Tonsillectomy      History  Substance Use Topics  . Smoking status: Former Games developer  . Smokeless tobacco: Never Used  . Alcohol Use: 2.5 oz/week    5 drink(s) per week     Comment: Socially    Family History  Problem Relation Age of Onset  . Heart disease Mother   . Hypertension Mother   . Osteoporosis Mother   . Congestive Heart Failure Mother   . Pancreatic cancer Father   . Cancer Father     Prostate  . Colon cancer Neg Hx   . Colon polyps Neg Hx   . Rectal cancer Neg Hx   . Stomach cancer Neg Hx   . Heart attack Brother     Allergies  Allergen Reactions  . Neosporin (Neomycin-Bacitracin Zn-Polymyx)     Medication list has been reviewed and updated.  Current Outpatient Prescriptions on File Prior  to Visit  Medication Sig Dispense Refill  . aspirin 81 MG tablet Take 81 mg by mouth daily.        . Calcium Carbonate-Vitamin D (CALCIUM-VITAMIN D) 500-200 MG-UNIT per tablet Take 3 tablets by mouth daily.       . simvastatin (ZOCOR) 20 MG tablet TAKE (2) TABLETS DAILY.  180 tablet  2  . SALINE NASAL SPRAY NA Place into the nose.      [name] SYNTHROID 50 MCG tablet Take 1 tablet (50 mcg total) by mouth daily.  30 tablet  12   No current facility-administered medications on file prior to visit.    Review of Systems:  As per HPI- otherwise negative.   Physical Examination: Filed Vitals:   06/02/13 1358  BP: 122/70  Pulse: 81  Temp: 97.9 F (36.6 C)  Resp: 18   Filed Vitals:   06/02/13 1358  Height: 5\' 1"  (1.549 m)  Weight:  126 lb (57.153 kg)   Body mass index is 23.82 kg/(m^2). Ideal Body Weight: Weight in (lb) to have BMI = 25: 132  GEN: WDWN, NAD, Non-toxic, A & O x 3 HEENT: Atraumatic, Normocephalic. Neck supple. No masses, No LAD.  No angioedema Ears and Nose: No external deformity. CV: RRR, No M/G/R. No JVD. No thrill. No extra heart sounds. PULM: CTA B, no wheezes, crackles, rhonchi. No retractions. No resp. distress. No accessory muscle use. EXTR: No c/c/e.  No hives  NEURO Normal gait.  PSYCH: Normally interactive. Conversant. Not depressed or anxious appearing.  Calm demeanor.  Left arm; over the lateral left mid- arm there is an area of swelling, warmth and tenderness.  It measures about 6 X4 inches.  The elbow, shoulder, wrist and hand are all normal   Discussed risks and benefits of depo- medrol; she would like to receive this treatment.  Reports she actually called her allergist who recommended that she be treated with depo- medrol 120 mg  Assessment and Plan: Insect bite of arm, left, initial encounter - Plan: methylPREDNISolone acetate (DEPO-MEDROL) injection 120 mg  Treat with depo- medrol.  Keep epi- pen handy. Topicals as needed for sx.  If not better please let us know.   See patient instructions for more details.     Signed Abbe Amsterdam, MD

## 2013-06-02 NOTE — Patient Instructions (Addendum)
Keep your epipen handy- if you need to use it also call [phone]!  You may continue to use topical medications and benadryl as needed.  If your sting area is not getting better- or if it is getting worse- please let me know,

## 2013-06-27 ENCOUNTER — Ambulatory Visit
Admission: RE | Admit: 2013-06-27 | Discharge: 2013-06-27 | Disposition: A | Discharge status: Home / Self Care | Payer: Medicare Other | Source: Ambulatory Visit

## 2013-06-27 DIAGNOSIS — Z1231 Encounter for screening mammogram for malignant neoplasm of breast: Secondary | ICD-10-CM

## 2013-06-29 ENCOUNTER — Other Ambulatory Visit: Payer: Self-pay | Admitting: Internal Medicine

## 2013-07-02 ENCOUNTER — Other Ambulatory Visit: Payer: Self-pay | Admitting: *Deleted

## 2013-07-02 NOTE — Telephone Encounter (Signed)
OK to fill this prescription with additional refills x5 Thank you!  

## 2013-07-02 NOTE — Addendum Note (Signed)
Addended by: [name] on: 07/02/2013 05:39 PM   Modules accepted: Orders, Medications

## 2013-07-02 NOTE — Telephone Encounter (Signed)
Re-done. See Rx pls Thx

## 2013-07-02 NOTE — Telephone Encounter (Signed)
Pt called requesting refill on Lorazepam.  Please advise

## 2013-07-02 NOTE — Telephone Encounter (Signed)
Lorazepam is not on Pt medication list.

## 2013-07-03 ENCOUNTER — Other Ambulatory Visit: Payer: Self-pay | Admitting: *Deleted

## 2013-07-03 ENCOUNTER — Telehealth (INDEPENDENT_AMBULATORY_CARE_PROVIDER_SITE_OTHER): Payer: Self-pay

## 2013-07-03 DIAGNOSIS — E042 Nontoxic multinodular goiter: Secondary | ICD-10-CM

## 2013-07-03 MED ORDER — LORAZEPAM 0.5 MG PO TABS
0.5000 mg | ORAL_TABLET | Freq: Two times a day (BID) | ORAL | Status: DC | PRN
Start: 2013-07-03 — End: 2013-07-25

## 2013-07-03 MED ORDER — LEVOTHYROXINE SODIUM 50 MCG PO TABS: 50.0000 ug | ORAL_TABLET | Freq: Every day | ORAL | Status: DC

## 2013-07-03 NOTE — Telephone Encounter (Signed)
Patient is asking for synthroid 50 mcg refill advised per Arline Asp smithey LPN patient is to be followed by her PCP for thyroid labs/medication. Patient aware

## 2013-07-03 NOTE — Telephone Encounter (Signed)
msg sent to Dr Gerrit Friends

## 2013-07-04 MED ORDER — LORAZEPAM 0.5 MG PO TABS: 0.5000 mg | ORAL_TABLET | Freq: Two times a day (BID) | ORAL | Status: DC | PRN

## 2013-07-09 ENCOUNTER — Ambulatory Visit (INDEPENDENT_AMBULATORY_CARE_PROVIDER_SITE_OTHER): Payer: Medicare Other | Admitting: Internal Medicine

## 2013-07-09 ENCOUNTER — Encounter: Payer: Self-pay | Admitting: Internal Medicine

## 2013-07-09 VITALS — BP 150/100 | HR 80 | Temp 97.4°F | Resp 16 | Wt 121.0 lb

## 2013-07-09 DIAGNOSIS — K589 Irritable bowel syndrome without diarrhea: Secondary | ICD-10-CM | POA: Diagnosis not present

## 2013-07-09 DIAGNOSIS — E039 Hypothyroidism, unspecified: Secondary | ICD-10-CM

## 2013-07-09 DIAGNOSIS — B029 Zoster without complications: Secondary | ICD-10-CM

## 2013-07-09 DIAGNOSIS — R51 Headache: Secondary | ICD-10-CM

## 2013-07-09 MED ORDER — VALACYCLOVIR HCL 1 G PO TABS: 1000.0000 mg | ORAL_TABLET | Freq: Two times a day (BID) | ORAL | Status: DC

## 2013-07-09 MED ORDER — GABAPENTIN 100 MG PO CAPS: 100.0000 mg | ORAL_CAPSULE | Freq: Three times a day (TID) | ORAL | Status: DC

## 2013-07-09 NOTE — Assessment & Plan Note (Signed)
Valtrex Gabapentin

## 2013-07-09 NOTE — Progress Notes (Signed)
   Subjective:    HPI  C/o HA x 5 d C/o R scalp pain and itching F/u bloating - resolved on gluten free diet F/u HTN, dyslipidemia, allergies  Wt Readings from Last 3 Encounters:  07/09/13 121 lb (54.885 kg)  06/02/13 126 lb (57.153 kg)  04/19/13 124 lb 12.8 oz (56.609 kg)   BP Readings from Last 3 Encounters:  07/09/13 150/100  06/02/13 122/70  05/08/13 102/63       Review of Systems  Constitutional: Negative for diaphoresis, activity change, appetite change, fatigue and unexpected weight change.  HENT: Positive for congestion, sore throat and voice change. Negative for hearing loss, nosebleeds, facial swelling, sneezing, trouble swallowing, neck pain, sinus pressure and tinnitus.   Eyes: Negative.   Respiratory: Negative for apnea, cough, choking, chest tightness and stridor.   Cardiovascular: Negative for palpitations and leg swelling.  Gastrointestinal: Negative.   Genitourinary: Negative.   Musculoskeletal: Negative for back pain, joint swelling, arthralgias and gait problem.  Skin: Negative for color change, pallor and wound.  Neurological: Negative.   Hematological: Negative for adenopathy. Does not bruise/bleed easily.  Psychiatric/Behavioral: Negative.        Objective:   Physical Exam  Vitals reviewed. Constitutional: She is oriented to person, place, and time. She appears well-developed and well-nourished.  Non-toxic appearance. She does not have a sickly appearance. She does not appear ill. No distress.  HENT:  Head: Normocephalic and atraumatic.  Mouth/Throat: Oropharynx is clear and moist. No oropharyngeal exudate.  Slight hoarsness and a slight erythema, no thrush  Eyes: Conjunctivae are normal. Right eye exhibits no discharge. Left eye exhibits no discharge. No scleral icterus.  Neck: Normal range of motion. Neck supple. No JVD present. No tracheal deviation present. No thyromegaly present.  Cardiovascular: Normal rate, regular rhythm, normal heart  sounds and intact distal pulses.  Exam reveals no gallop and no friction rub.   No murmur heard. Pulmonary/Chest: Effort normal and breath sounds normal. No stridor. No respiratory distress. She has no wheezes. She has no rales. She exhibits no tenderness.  Abdominal: Soft. Bowel sounds are normal. She exhibits no distension and no mass. There is no tenderness. There is no rebound and no guarding.  Musculoskeletal: Normal range of motion. She exhibits no edema and no tenderness.  Lymphadenopathy:    She has no cervical adenopathy.  Neurological: She is oriented to person, place, and time.  Skin: Skin is warm and dry. No rash noted. She is not diaphoretic. No erythema. No pallor.  Psychiatric: She has a normal mood and affect. Her behavior is normal. Judgment and thought content normal.  3 mm papule or an early blister on R forehead  Subjective:        Assessment & Plan:           Assessment & Plan:

## 2013-07-10 DIAGNOSIS — R519 Headache, unspecified: Secondary | ICD-10-CM

## 2013-07-10 HISTORY — DX: Headache, unspecified: R51.9

## 2013-07-10 NOTE — Assessment & Plan Note (Signed)
Poss due to H zoster Ibuprofen prn

## 2013-07-10 NOTE — Assessment & Plan Note (Signed)
much better on gluten free diet

## 2013-07-10 NOTE — Assessment & Plan Note (Signed)
Continue with current prescription therapy as reflected on the Med list.  

## 2013-07-11 DIAGNOSIS — B0230 Zoster ocular disease, unspecified: Secondary | ICD-10-CM | POA: Diagnosis not present

## 2013-07-11 DIAGNOSIS — Z961 Presence of intraocular lens: Secondary | ICD-10-CM | POA: Diagnosis not present

## 2013-07-11 DIAGNOSIS — H04129 Dry eye syndrome of unspecified lacrimal gland: Secondary | ICD-10-CM | POA: Diagnosis not present

## 2013-07-11 DIAGNOSIS — H1045 Other chronic allergic conjunctivitis: Secondary | ICD-10-CM | POA: Diagnosis not present

## 2013-07-16 ENCOUNTER — Telehealth: Payer: Self-pay | Admitting: Internal Medicine

## 2013-07-16 ENCOUNTER — Encounter: Payer: Self-pay | Admitting: Internal Medicine

## 2013-07-16 ENCOUNTER — Ambulatory Visit (INDEPENDENT_AMBULATORY_CARE_PROVIDER_SITE_OTHER): Payer: Medicare Other | Admitting: Internal Medicine

## 2013-07-16 ENCOUNTER — Ambulatory Visit: Payer: Medicare Other | Admitting: Internal Medicine

## 2013-07-16 ENCOUNTER — Other Ambulatory Visit (INDEPENDENT_AMBULATORY_CARE_PROVIDER_SITE_OTHER): Payer: Medicare Other

## 2013-07-16 VITALS — BP 120/72 | HR 76 | Temp 97.5°F | Resp 16

## 2013-07-16 DIAGNOSIS — B029 Zoster without complications: Secondary | ICD-10-CM

## 2013-07-16 DIAGNOSIS — H6120 Impacted cerumen, unspecified ear: Secondary | ICD-10-CM | POA: Insufficient documentation

## 2013-07-16 DIAGNOSIS — R51 Headache: Secondary | ICD-10-CM

## 2013-07-16 DIAGNOSIS — H6121 Impacted cerumen, right ear: Secondary | ICD-10-CM

## 2013-07-16 DIAGNOSIS — L299 Pruritus, unspecified: Secondary | ICD-10-CM

## 2013-07-16 HISTORY — DX: Impacted cerumen, right ear: H61.21

## 2013-07-16 LAB — CBC WITH DIFFERENTIAL/PLATELET
Basophils Absolute: 0 10*3/uL (ref 0.0–0.1)
Basophils Relative: 0.6 % (ref 0.0–3.0)
Eosinophils Absolute: 0.1 10*3/uL (ref 0.0–0.7)
Eosinophils Relative: 1.4 % (ref 0.0–5.0)
HCT: 40.3 % (ref 36.0–46.0)
Hemoglobin: 13.8 g/dL (ref 12.0–15.0)
Lymphocytes Relative: 17.1 % (ref 12.0–46.0)
Lymphs Abs: 1.1 10*3/uL (ref 0.7–4.0)
MCHC: 34.2 g/dL (ref 30.0–36.0)
MCV: 92.9 fl (ref 78.0–100.0)
Monocytes Absolute: 0.6 10*3/uL (ref 0.1–1.0)
Monocytes Relative: 9.2 % (ref 3.0–12.0)
Neutro Abs: 4.4 10*3/uL (ref 1.4–7.7)
Neutrophils Relative %: 71.7 % (ref 43.0–77.0)
Platelets: 281 10*3/uL (ref 150.0–400.0)
RBC: 4.34 Mil/uL (ref 3.87–5.11)
RDW: 13.8 % (ref 11.5–14.6)
WBC: 6.1 10*3/uL (ref 4.5–10.5)

## 2013-07-16 LAB — BASIC METABOLIC PANEL
BUN: 14 mg/dL (ref 6–23)
CO2: 26 mEq/L (ref 19–32)
Calcium: 9.3 mg/dL (ref 8.4–10.5)
Chloride: 105 mEq/L (ref 96–112)
Creatinine, Ser: 0.7 mg/dL (ref 0.4–1.2)
GFR: 94.44 mL/min (ref >60.00)
Glucose, Bld: 94 mg/dL (ref 70–99)
Potassium: 4.4 mEq/L (ref 3.5–5.1)
Sodium: 137 mEq/L (ref 135–145)

## 2013-07-16 LAB — HEPATIC FUNCTION PANEL
ALT: 28 U/L (ref 0–35)
AST: 27 U/L (ref 0–37)
Albumin: 3.9 g/dL (ref 3.5–5.2)
Alkaline Phosphatase: 36 U/L — ABNORMAL LOW (ref 39–117)
Bilirubin, Direct: 0.1 mg/dL (ref 0.0–0.3)
Total Bilirubin: 0.6 mg/dL (ref 0.3–1.2)
Total Protein: 7.3 g/dL (ref 6.0–8.3)

## 2013-07-16 LAB — SEDIMENTATION RATE: Sed Rate: 25 mm/hr — ABNORMAL HIGH (ref 0–22)

## 2013-07-16 NOTE — Assessment & Plan Note (Addendum)
No new rash. Finish Valtrex  After wax removal: poss 1-2 mm hemorragic vesicles at 4 and 5 o'clock of the ear canal

## 2013-07-16 NOTE — Telephone Encounter (Signed)
Received 1 page from [facility]. Sent to Dr. Posey Rea. 07/16/13/ss

## 2013-07-16 NOTE — Assessment & Plan Note (Addendum)
Poss H zoster 8/14 Labs  After wax removal: poss 1-2 mm hemorragic vesicles at 4 and 5 o'clock of the ear canal

## 2013-07-16 NOTE — Assessment & Plan Note (Signed)
Scalp - likely due to zoster 8/14 labs

## 2013-07-16 NOTE — Progress Notes (Signed)
   Subjective:    HPI  C/o HA x 5 d C/o R scalp pain and itching F/u bloating - resolved on gluten free diet F/u HTN, dyslipidemia, allergies  Wt Readings from Last 3 Encounters:  07/09/13 121 lb (54.885 kg)  06/02/13 126 lb (57.153 kg)  04/19/13 124 lb 12.8 oz (56.609 kg)   BP Readings from Last 3 Encounters:  07/16/13 120/72  07/09/13 150/100  06/02/13 122/70    Review of Systems  Constitutional: Negative for diaphoresis, activity change, appetite change, fatigue and unexpected weight change.  HENT: Positive for congestion, sore throat and voice change. Negative for hearing loss, nosebleeds, facial swelling, sneezing, trouble swallowing, neck pain, sinus pressure and tinnitus.   Eyes: Negative.   Respiratory: Negative for apnea, cough, choking, chest tightness and stridor.   Cardiovascular: Negative for palpitations and leg swelling.  Gastrointestinal: Negative.   Genitourinary: Negative.   Musculoskeletal: Negative for back pain, joint swelling, arthralgias and gait problem.  Skin: Negative for color change, pallor and wound.  Neurological: Negative.   Hematological: Negative for adenopathy. Does not bruise/bleed easily.  Psychiatric/Behavioral: Negative.        Objective:   Physical Exam  Vitals reviewed. Constitutional: She is oriented to person, place, and time. She appears well-developed and well-nourished.  Non-toxic appearance. She does not have a sickly appearance. She does not appear ill. No distress.  HENT:  Head: Normocephalic and atraumatic.  Mouth/Throat: Oropharynx is clear and moist. No oropharyngeal exudate.  Slight hoarsness and a slight erythema, no thrush  Eyes: Conjunctivae are normal. Right eye exhibits no discharge. Left eye exhibits no discharge. No scleral icterus.  Neck: Normal range of motion. Neck supple. No JVD present. No tracheal deviation present. No thyromegaly present.  Cardiovascular: Normal rate, regular rhythm, normal heart sounds  and intact distal pulses.  Exam reveals no gallop and no friction rub.   No murmur heard. Pulmonary/Chest: Effort normal and breath sounds normal. No stridor. No respiratory distress. She has no wheezes. She has no rales. She exhibits no tenderness.  Abdominal: Soft. Bowel sounds are normal. She exhibits no distension and no mass. There is no tenderness. There is no rebound and no guarding.  Musculoskeletal: Normal range of motion. She exhibits no edema and no tenderness.  Lymphadenopathy:    She has no cervical adenopathy.  Neurological: She is oriented to person, place, and time.  Skin: Skin is warm and dry. No rash noted. She is not diaphoretic. No erythema. No pallor.  Psychiatric: She has a normal mood and affect. Her behavior is normal. Judgment and thought content normal.  R ear wax. After wax removal: poss 1-2 mm hemorragic vesicles at 4 and 5 o'clock of the ear canal   Procedure Note :     Procedure :  Ear irrigation   Indication:  Cerumen impaction R   Risks, including pain, dizziness, eardrum perforation, bleeding, infection and others as well as benefits were explained to the patient in detail. Verbal consent was obtained and the patient agreed to proceed.    We used "The Elephant Ear Irrigation Device" filled with lukewarm water for irrigation. A large amount wax was recovered. Procedure has also required manual wax removal with an ear loop.   Tolerated well. Complications: None.   Postprocedure instructions :  Call if problems.    Subjective:        Assessment & Plan:           Assessment & Plan:

## 2013-07-16 NOTE — Assessment & Plan Note (Signed)
See procedure 

## 2013-07-25 ENCOUNTER — Ambulatory Visit (INDEPENDENT_AMBULATORY_CARE_PROVIDER_SITE_OTHER): Payer: Medicare Other | Admitting: Internal Medicine

## 2013-07-25 ENCOUNTER — Encounter: Payer: Self-pay | Admitting: Internal Medicine

## 2013-07-25 VITALS — BP 120/72 | HR 68 | Temp 97.5°F | Resp 16 | Ht 62.5 in

## 2013-07-25 DIAGNOSIS — Z0000 Encounter for general adult medical examination without abnormal findings: Secondary | ICD-10-CM

## 2013-07-25 DIAGNOSIS — R51 Headache: Secondary | ICD-10-CM

## 2013-07-25 DIAGNOSIS — E039 Hypothyroidism, unspecified: Secondary | ICD-10-CM | POA: Diagnosis not present

## 2013-07-25 DIAGNOSIS — B029 Zoster without complications: Secondary | ICD-10-CM

## 2013-07-25 MED ORDER — VALACYCLOVIR HCL 1 G PO TABS: 1000.0000 mg | ORAL_TABLET | Freq: Three times a day (TID) | ORAL | Status: DC

## 2013-07-25 MED ORDER — LORAZEPAM 1 MG PO TABS: 1.0000 mg | ORAL_TABLET | Freq: Two times a day (BID) | ORAL | Status: DC | PRN

## 2013-07-25 NOTE — Patient Instructions (Signed)
Contour pillow  

## 2013-07-25 NOTE — Assessment & Plan Note (Signed)
Continue with current prescription therapy as reflected on the Med list.  

## 2013-07-25 NOTE — Assessment & Plan Note (Signed)
Here for medicare wellness/physical  Diet: heart healthy  Physical activity: not sedentary, very active  Depression/mood screen: negative  Hearing: intact to whispered voice  Visual acuity: grossly normal, performs annual eye exam  ADLs: capable  Fall risk: none  Home safety: good  Cognitive evaluation: intact to orientation, naming, recall and repetition  EOL planning: adv directives, full code/ I agree  I have personally reviewed and have noted  1. The patient's medical and social history  2. Their use of alcohol, tobacco or illicit drugs  3. Their current medications and supplements  4. The patient's functional ability including ADL's, fall risks, home safety risks and hearing or visual impairment.  5. Diet and physical activities  6. Evidence for depression or mood disorders    Today patient counseled on age appropriate routine health concerns for screening and prevention, each reviewed and up to date or declined. Immunizations reviewed and up to date or declined. Labs ordered and reviewed. Risk factors for depression reviewed and negative. Hearing function and visual acuity are intact. ADLs screened and addressed as needed. Functional ability and level of safety reviewed and appropriate. Education, counseling and referrals performed based on assessed risks today. Patient provided with a copy of personalized plan for preventive services.       

## 2013-07-25 NOTE — Assessment & Plan Note (Signed)
Resolved

## 2013-07-25 NOTE — Progress Notes (Signed)
   Subjective:    HPI  The patient is here for a wellness exam. The patient has been doing well overall without major physical or psychological issues going on lately.  F/u HA - 90% better F/u R scalp pain and itching - better F/u bloating - resolved on gluten free diet F/u HTN, dyslipidemia, allergies  Wt Readings from Last 3 Encounters:  07/09/13 121 lb (54.885 kg)  06/02/13 126 lb (57.153 kg)  04/19/13 124 lb 12.8 oz (56.609 kg)   BP Readings from Last 3 Encounters:  07/25/13 120/72  07/16/13 120/72  07/09/13 150/100    Review of Systems  Constitutional: Negative for diaphoresis, activity change, appetite change, fatigue and unexpected weight change.  HENT: Positive for congestion, sore throat and voice change. Negative for hearing loss, nosebleeds, facial swelling, sneezing, trouble swallowing, neck pain, sinus pressure and tinnitus.   Eyes: Negative.   Respiratory: Negative for apnea, cough, choking, chest tightness and stridor.   Cardiovascular: Negative for palpitations and leg swelling.  Gastrointestinal: Negative.   Genitourinary: Negative.   Musculoskeletal: Negative for back pain, joint swelling, arthralgias and gait problem.  Skin: Negative for color change, pallor and wound.  Neurological: Negative.   Hematological: Negative for adenopathy. Does not bruise/bleed easily.  Psychiatric/Behavioral: Negative.        Objective:   Physical Exam  Vitals reviewed. Constitutional: She is oriented to person, place, and time. She appears well-developed and well-nourished.  Non-toxic appearance. She does not have a sickly appearance. She does not appear ill. No distress.  HENT:  Head: Normocephalic and atraumatic.  Mouth/Throat: Oropharynx is clear and moist. No oropharyngeal exudate.  Slight hoarsness and a slight erythema, no thrush  Eyes: Conjunctivae are normal. Right eye exhibits no discharge. Left eye exhibits no discharge. No scleral icterus.  Neck: Normal  range of motion. Neck supple. No JVD present. No tracheal deviation present. No thyromegaly present.  Cardiovascular: Normal rate, regular rhythm, normal heart sounds and intact distal pulses.  Exam reveals no gallop and no friction rub.   No murmur heard. Pulmonary/Chest: Effort normal and breath sounds normal. No stridor. No respiratory distress. She has no wheezes. She has no rales. She exhibits no tenderness.  Abdominal: Soft. Bowel sounds are normal. She exhibits no distension and no mass. There is no tenderness. There is no rebound and no guarding.  Musculoskeletal: Normal range of motion. She exhibits no edema and no tenderness.  Lymphadenopathy:    She has no cervical adenopathy.  Neurological: She is oriented to person, place, and time.  Skin: Skin is warm and dry. No rash noted. She is not diaphoretic. No erythema. No pallor.  Psychiatric: She has a normal mood and affect. Her behavior is normal. Judgment and thought content normal.   1-2 mm hemorragic vesicles at 4 and 5 o'clock of the ear canal - healing       Subjective:        Assessment & Plan:           Assessment & Plan:

## 2013-07-26 NOTE — Assessment & Plan Note (Signed)
Better  

## 2013-08-07 NOTE — Telephone Encounter (Signed)
Addressed on 8/27 Thx

## 2013-08-13 ENCOUNTER — Ambulatory Visit (INDEPENDENT_AMBULATORY_CARE_PROVIDER_SITE_OTHER): Payer: Medicare Other | Admitting: Internal Medicine

## 2013-08-13 ENCOUNTER — Encounter: Payer: Self-pay | Admitting: Internal Medicine

## 2013-08-13 VITALS — BP 120/70 | HR 80 | Temp 98.3°F | Resp 16

## 2013-08-13 DIAGNOSIS — J309 Allergic rhinitis, unspecified: Secondary | ICD-10-CM

## 2013-08-13 DIAGNOSIS — J069 Acute upper respiratory infection, unspecified: Secondary | ICD-10-CM | POA: Diagnosis not present

## 2013-08-13 MED ORDER — PREDNISONE 10 MG PO TABS: ORAL_TABLET | ORAL | Status: DC

## 2013-08-13 MED ORDER — PROMETHAZINE-CODEINE 6.25-10 MG/5ML PO SYRP: 5.0000 mL | ORAL_SOLUTION | ORAL | Status: DC | PRN

## 2013-08-13 MED ORDER — METHYLPREDNISOLONE ACETATE 80 MG/ML IJ SUSP
80.0000 mg | Freq: Once | INTRAMUSCULAR | Status: AC
  Administered 2013-08-13: 80 mg via INTRAMUSCULAR

## 2013-08-13 MED ORDER — AZITHROMYCIN 250 MG PO TABS: ORAL_TABLET | ORAL | Status: DC

## 2013-08-13 NOTE — Assessment & Plan Note (Signed)
Zpac Depomedrol Prom-cod

## 2013-08-13 NOTE — Assessment & Plan Note (Signed)
Worse Depomedrol 80 mg im 

## 2013-08-13 NOTE — Progress Notes (Signed)
   Subjective:    Cough This is a new problem. The current episode started in the past 7 days. The problem has been gradually worsening. The cough is non-productive. Associated symptoms include a sore throat and wheezing. The treatment provided no relief.      F/u HA - 90% better F/u R scalp pain and itching - better F/u bloating - resolved on gluten free diet F/u HTN, dyslipidemia, allergies  Wt Readings from Last 3 Encounters:  07/09/13 121 lb (54.885 kg)  06/02/13 126 lb (57.153 kg)  04/19/13 124 lb 12.8 oz (56.609 kg)   BP Readings from Last 3 Encounters:  08/13/13 120/70  07/25/13 120/72  07/16/13 120/72    Review of Systems  Constitutional: Negative for diaphoresis, activity change, appetite change, fatigue and unexpected weight change.  HENT: Positive for congestion, sore throat and voice change. Negative for hearing loss, nosebleeds, facial swelling, sneezing, trouble swallowing, neck pain, sinus pressure and tinnitus.   Eyes: Negative.   Respiratory: Positive for wheezing. Negative for apnea, cough, choking, chest tightness and stridor.   Cardiovascular: Negative for palpitations and leg swelling.  Gastrointestinal: Negative.   Genitourinary: Negative.   Musculoskeletal: Negative for back pain, joint swelling, arthralgias and gait problem.  Skin: Negative for color change, pallor and wound.  Neurological: Negative.   Hematological: Negative for adenopathy. Does not bruise/bleed easily.  Psychiatric/Behavioral: Negative.        Objective:   Physical Exam  Vitals reviewed. Constitutional: She is oriented to person, place, and time. She appears well-developed and well-nourished.  Non-toxic appearance. She does not have a sickly appearance. She does not appear ill. No distress.  HENT:  Head: Normocephalic and atraumatic.  Mouth/Throat: Oropharynx is clear and moist. No oropharyngeal exudate.  Slight hoarsness and a slight erythema, no thrush  Eyes: Conjunctivae  are normal. Right eye exhibits no discharge. Left eye exhibits no discharge. No scleral icterus.  Neck: Normal range of motion. Neck supple. No JVD present. No tracheal deviation present. No thyromegaly present.  Cardiovascular: Normal rate, regular rhythm, normal heart sounds and intact distal pulses.  Exam reveals no gallop and no friction rub.   No murmur heard. Pulmonary/Chest: Effort normal and breath sounds normal. No stridor. No respiratory distress. She has no wheezes. She has no rales. She exhibits no tenderness.  Abdominal: Soft. Bowel sounds are normal. She exhibits no distension and no mass. There is no tenderness. There is no rebound and no guarding.  Musculoskeletal: Normal range of motion. She exhibits no edema and no tenderness.  Lymphadenopathy:    She has no cervical adenopathy.  Neurological: She is oriented to person, place, and time.  Skin: Skin is warm and dry. No rash noted. She is not diaphoretic. No erythema. No pallor.  Psychiatric: She has a normal mood and affect. Her behavior is normal. Judgment and thought content normal.         Subjective:        Assessment & Plan:           Assessment & Plan:

## 2013-08-20 ENCOUNTER — Encounter: Payer: Self-pay | Admitting: Internal Medicine

## 2013-08-20 ENCOUNTER — Ambulatory Visit (INDEPENDENT_AMBULATORY_CARE_PROVIDER_SITE_OTHER)
Admission: RE | Admit: 2013-08-20 | Discharge: 2013-08-20 | Disposition: A | Discharge status: Home / Self Care | Payer: Medicare Other | Source: Ambulatory Visit | Attending: Internal Medicine | Admitting: Internal Medicine

## 2013-08-20 ENCOUNTER — Ambulatory Visit (INDEPENDENT_AMBULATORY_CARE_PROVIDER_SITE_OTHER): Payer: Medicare Other | Admitting: Internal Medicine

## 2013-08-20 VITALS — BP 124/74 | HR 88 | Temp 97.1°F | Resp 16

## 2013-08-20 DIAGNOSIS — R059 Cough, unspecified: Secondary | ICD-10-CM

## 2013-08-20 DIAGNOSIS — R05 Cough: Secondary | ICD-10-CM

## 2013-08-20 DIAGNOSIS — J069 Acute upper respiratory infection, unspecified: Secondary | ICD-10-CM | POA: Diagnosis not present

## 2013-08-20 DIAGNOSIS — J42 Unspecified chronic bronchitis: Secondary | ICD-10-CM | POA: Diagnosis not present

## 2013-08-20 MED ORDER — BUDESONIDE-FORMOTEROL FUMARATE 80-4.5 MCG/ACT IN AERO: 2.0000 | INHALATION_SPRAY | Freq: Two times a day (BID) | RESPIRATORY_TRACT | Status: DC

## 2013-08-20 MED ORDER — ALBUTEROL SULFATE HFA 108 (90 BASE) MCG/ACT IN AERS: 2.0000 | INHALATION_SPRAY | Freq: Four times a day (QID) | RESPIRATORY_TRACT | Status: DC | PRN

## 2013-08-20 NOTE — Progress Notes (Signed)
Patient ID: [name], female   DOB: December 27, 1935, 77 y.o.   MRN: [id number]   Subjective:    Cough This is a new problem. The current episode started in the past 7 days. The problem has been gradually worsening. The cough is non-productive. Associated symptoms include a sore throat and wheezing. The treatment provided no relief.    Wt Readings from Last 3 Encounters:  07/09/13 121 lb (54.885 kg)  06/02/13 126 lb (57.153 kg)  04/19/13 124 lb 12.8 oz (56.609 kg)   BP Readings from Last 3 Encounters:  08/20/13 124/74  08/13/13 120/70  07/25/13 120/72    Review of Systems  Constitutional: Negative for diaphoresis, activity change, appetite change, fatigue and unexpected weight change.  HENT: Positive for congestion, sore throat and voice change. Negative for hearing loss, nosebleeds, facial swelling, sneezing, trouble swallowing, neck pain, sinus pressure and tinnitus.   Eyes: Negative.   Respiratory: Positive for wheezing. Negative for apnea, cough, choking, chest tightness and stridor.   Cardiovascular: Negative for palpitations and leg swelling.  Gastrointestinal: Negative.   Genitourinary: Negative.   Musculoskeletal: Negative for back pain, joint swelling, arthralgias and gait problem.  Skin: Negative for color change, pallor and wound.  Neurological: Negative.   Hematological: Negative for adenopathy. Does not bruise/bleed easily.  Psychiatric/Behavioral: Negative.        Objective:   Physical Exam  Vitals reviewed. Constitutional: She is oriented to person, place, and time. She appears well-developed and well-nourished.  Non-toxic appearance. She does not have a sickly appearance. She does not appear ill. No distress.  HENT:  Head: Normocephalic and atraumatic.  Mouth/Throat: Oropharynx is clear and moist. No oropharyngeal exudate.  Slight hoarsness and a slight erythema, no thrush  Eyes: Conjunctivae are normal. Right eye exhibits no discharge. Left eye exhibits no  discharge. No scleral icterus.  Neck: Normal range of motion. Neck supple. No JVD present. No tracheal deviation present. No thyromegaly present.  Cardiovascular: Normal rate, regular rhythm, normal heart sounds and intact distal pulses.  Exam reveals no gallop and no friction rub.   No murmur heard. Pulmonary/Chest: Effort normal and breath sounds normal. No stridor. No respiratory distress. She has no wheezes. She has no rales. She exhibits no tenderness.  Abdominal: Soft. Bowel sounds are normal. She exhibits no distension and no mass. There is no tenderness. There is no rebound and no guarding.  Musculoskeletal: Normal range of motion. She exhibits no edema and no tenderness.  Lymphadenopathy:    She has no cervical adenopathy.  Neurological: She is oriented to person, place, and time.  Skin: Skin is warm and dry. No rash noted. She is not diaphoretic. No erythema. No pallor.  Psychiatric: She has a normal mood and affect. Her behavior is normal. Judgment and thought content normal.      I personally provided Symbicort inhaler use teaching. After the teaching patient was able to demonstrate it's use effectively. All questions were answered    Subjective:        Assessment & Plan:           Assessment & Plan:

## 2013-08-22 NOTE — Assessment & Plan Note (Addendum)
  Zpac Due to URI - some degree of tracheitis and bronchospasm are present Symbicort MDI added

## 2013-08-23 NOTE — Assessment & Plan Note (Signed)
Due to URI - some degree of tracheitis and bronchospasm are present Symbicort MDI added

## 2013-09-03 ENCOUNTER — Other Ambulatory Visit: Payer: Self-pay | Admitting: Internal Medicine

## 2013-09-03 DIAGNOSIS — Z23 Encounter for immunization: Secondary | ICD-10-CM | POA: Diagnosis not present

## 2013-09-03 MED ORDER — FLUTICASONE PROPIONATE 50 MCG/ACT NA SUSP: 2.0000 | Freq: Every day | NASAL | Status: DC

## 2013-09-12 ENCOUNTER — Ambulatory Visit (INDEPENDENT_AMBULATORY_CARE_PROVIDER_SITE_OTHER): Payer: Medicare Other | Admitting: Gynecology

## 2013-09-12 ENCOUNTER — Encounter: Payer: Self-pay | Admitting: Gynecology

## 2013-09-12 DIAGNOSIS — N6019 Diffuse cystic mastopathy of unspecified breast: Secondary | ICD-10-CM | POA: Diagnosis not present

## 2013-09-12 NOTE — Progress Notes (Signed)
Patient presents for breast exam. She has dense fibrocystic breasts and has been getting every 6 month breast exams per Dr. Eda Paschal.  Had routine mammography in July which was normal. Had questions about doing 3-D mammography.  Exam of [employer] Both breasts examined lying and sitting without masses retractions discharge adenopathy.  Assessment and plan: Dense breasts on six-month breast exam schedule as agreed upon by Dr. Eda Paschal. Normal exam today.  Return in 6 months for annual exam.  Recommend 3-D mammography when she gets her next mammogram. Reviewed that I could not guarantee that she did not have an early carcinoma at this point but did not feel was necessary to go in now for 3-D mammography.

## 2013-09-12 NOTE — Patient Instructions (Signed)
followup in 6 months for annual exam.

## 2013-09-13 ENCOUNTER — Telehealth: Payer: Self-pay | Admitting: Internal Medicine

## 2013-09-13 DIAGNOSIS — B029 Zoster without complications: Secondary | ICD-10-CM | POA: Diagnosis not present

## 2013-09-13 NOTE — Telephone Encounter (Signed)
Pt thinks her shingles is coming back.  Headache. Itching around eye, on forehead and ears. She wants a call.  Should she start taking the medicine again.

## 2013-09-13 NOTE — Telephone Encounter (Signed)
I'm sorry! Yes, please take Rx I'll make a ref for [name] to see an Infectious Disease doctor (Dr Ninetta Lights) for a consultation Thx

## 2013-09-13 NOTE — Telephone Encounter (Signed)
Left detailed mess informing pt of below.  

## 2013-09-13 NOTE — Telephone Encounter (Signed)
Attempted to call pt at 11:40 and message left.

## 2013-09-20 ENCOUNTER — Ambulatory Visit (INDEPENDENT_AMBULATORY_CARE_PROVIDER_SITE_OTHER): Payer: Medicare Other | Admitting: Internal Medicine

## 2013-09-20 ENCOUNTER — Encounter: Payer: Self-pay | Admitting: Internal Medicine

## 2013-09-20 VITALS — BP 136/90 | HR 77 | Temp 97.6°F

## 2013-09-20 DIAGNOSIS — J309 Allergic rhinitis, unspecified: Secondary | ICD-10-CM

## 2013-09-20 DIAGNOSIS — H9209 Otalgia, unspecified ear: Secondary | ICD-10-CM | POA: Diagnosis not present

## 2013-09-20 DIAGNOSIS — L299 Pruritus, unspecified: Secondary | ICD-10-CM

## 2013-09-20 DIAGNOSIS — B029 Zoster without complications: Secondary | ICD-10-CM | POA: Diagnosis not present

## 2013-09-20 MED ORDER — DESLORATADINE 5 MG PO TABS: 5.0000 mg | ORAL_TABLET | Freq: Every day | ORAL | Status: DC

## 2013-09-20 MED ORDER — VALACYCLOVIR HCL 500 MG PO TABS: 500.0000 mg | ORAL_TABLET | Freq: Two times a day (BID) | ORAL | Status: DC

## 2013-09-20 NOTE — Assessment & Plan Note (Signed)
S/p H zoster vaccination Relapsing 8/12 - on face 8/14 - R ear/face 10/14 ?  Re-start Gabapentin

## 2013-09-20 NOTE — Progress Notes (Signed)
   Subjective:    Headache  This is a recurrent problem. The current episode started in the past 7 days. The problem occurs intermittently. The pain is located in the right unilateral region. The pain is moderate. Associated symptoms include ear pain and a sore throat. Pertinent negatives include no back pain, coughing, hearing loss, neck pain, sinus pressure, tinnitus or weakness. The treatment provided mild relief.    C/o R HA x 7 d relapsed C/o R scalp pain and itching - relapsed F/u bloating - resolved on gluten free diet F/u HTN, dyslipidemia, allergies  Wt Readings from Last 3 Encounters:  07/09/13 121 lb (54.885 kg)  06/02/13 126 lb (57.153 kg)  04/19/13 124 lb 12.8 oz (56.609 kg)   BP Readings from Last 3 Encounters:  09/20/13 136/90  08/20/13 124/74  08/13/13 120/70    Review of Systems  Constitutional: Negative for diaphoresis, activity change, appetite change, fatigue and unexpected weight change.  HENT: Positive for congestion, ear pain, sore throat and voice change. Negative for facial swelling, hearing loss, nosebleeds, sinus pressure, sneezing, tinnitus and trouble swallowing.   Eyes: Negative.   Respiratory: Negative for apnea, cough, choking, chest tightness and stridor.   Cardiovascular: Negative for palpitations and leg swelling.  Gastrointestinal: Negative.   Genitourinary: Negative.   Musculoskeletal: Negative for arthralgias, back pain, gait problem, joint swelling and neck pain.  Skin: Negative for color change, pallor and wound.  Neurological: Positive for headaches. Negative for weakness.  Hematological: Negative for adenopathy. Does not bruise/bleed easily.  Psychiatric/Behavioral: Negative.        Objective:   Physical Exam  Vitals reviewed. Constitutional: She is oriented to person, place, and time. She appears well-developed and well-nourished.  Non-toxic appearance. She does not have a sickly appearance. She does not appear ill. No distress.   HENT:  Head: Normocephalic and atraumatic.  Mouth/Throat: Oropharynx is clear and moist. No oropharyngeal exudate.  Slight hoarsness and a slight erythema, no thrush  Eyes: Conjunctivae are normal. Right eye exhibits no discharge. Left eye exhibits no discharge. No scleral icterus.  Neck: Normal range of motion. Neck supple. No JVD present. No tracheal deviation present. No thyromegaly present.  Cardiovascular: Normal rate, regular rhythm, normal heart sounds and intact distal pulses.  Exam reveals no gallop and no friction rub.   No murmur heard. Pulmonary/Chest: Effort normal and breath sounds normal. No stridor. No respiratory distress. She has no wheezes. She has no rales. She exhibits no tenderness.  Abdominal: Soft. Bowel sounds are normal. She exhibits no distension and no mass. There is no tenderness. There is no rebound and no guarding.  Musculoskeletal: Normal range of motion. She exhibits no edema and no tenderness.  Lymphadenopathy:    She has no cervical adenopathy.  Neurological: She is oriented to person, place, and time.  Skin: Skin is warm and dry. No rash noted. She is not diaphoretic. No erythema. No pallor.  Psychiatric: She has a normal mood and affect. Her behavior is normal. Judgment and thought content normal.  No rash        Subjective:        Assessment & Plan:           Assessment & Plan:

## 2013-09-20 NOTE — Assessment & Plan Note (Addendum)
S/p H zoster vaccination Relapsing 8/12 - on face 8/14 - R ear/face 10/14 ? 

## 2013-09-20 NOTE — Progress Notes (Signed)
Patient refused to have weight taken today

## 2013-09-20 NOTE — Assessment & Plan Note (Signed)
S/p H zoster vaccination Relapsing 8/12 - on face 8/14 - R ear/face 10/14 ?

## 2013-09-20 NOTE — Assessment & Plan Note (Signed)
Allergy consult

## 2013-09-27 ENCOUNTER — Ambulatory Visit: Payer: Medicare Other | Admitting: Internal Medicine

## 2013-09-27 ENCOUNTER — Ambulatory Visit: Payer: Medicare Other | Admitting: Podiatry

## 2013-09-27 ENCOUNTER — Encounter: Payer: Self-pay | Admitting: Podiatry

## 2013-09-27 VITALS — BP 116/67 | HR 82 | Resp 12

## 2013-09-27 DIAGNOSIS — M779 Enthesopathy, unspecified: Secondary | ICD-10-CM | POA: Diagnosis not present

## 2013-09-27 DIAGNOSIS — L84 Corns and callosities: Secondary | ICD-10-CM

## 2013-09-27 MED ORDER — TRIAMCINOLONE ACETONIDE 10 MG/ML IJ SUSP
5.0000 mg | Freq: Once | INTRAMUSCULAR | Status: AC
Start: 2013-09-27 — End: 2013-09-27
  Administered 2013-09-27: 5 mg via INTRA_ARTICULAR

## 2013-09-30 NOTE — Progress Notes (Signed)
Subjective:     Patient ID: [name], female   DOB: 03-12-1936, 77 y.o.   MRN: [id number]  HPI patient presents complaining of pain between the fourth and fifth toes with fluid buildup inflammation and inability to take care of herself   Review of Systems     Objective:   Physical Exam  Nursing note and vitals reviewed. Constitutional: She is oriented to person, place, and time.  Cardiovascular: Intact distal pulses.   Musculoskeletal: Normal range of motion.  Neurological: She is oriented to person, place, and time.   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 Numbness occurred and debridement of lesion with no iatrogenic bleeding noted

## 2013-10-01 ENCOUNTER — Ambulatory Visit: Payer: Medicare Other | Admitting: Internal Medicine

## 2013-10-01 ENCOUNTER — Other Ambulatory Visit: Payer: Self-pay | Admitting: Internal Medicine

## 2013-10-01 DIAGNOSIS — B029 Zoster without complications: Secondary | ICD-10-CM | POA: Diagnosis not present

## 2013-10-02 ENCOUNTER — Ambulatory Visit (INDEPENDENT_AMBULATORY_CARE_PROVIDER_SITE_OTHER): Payer: Medicare Other | Admitting: Internal Medicine

## 2013-10-02 ENCOUNTER — Other Ambulatory Visit: Payer: Self-pay | Admitting: Internal Medicine

## 2013-10-02 ENCOUNTER — Encounter: Payer: Self-pay | Admitting: Internal Medicine

## 2013-10-02 VITALS — BP 124/79 | HR 84 | Temp 97.8°F | Ht 62.4 in | Wt 132.8 lb

## 2013-10-02 DIAGNOSIS — B0229 Other postherpetic nervous system involvement: Secondary | ICD-10-CM | POA: Diagnosis not present

## 2013-10-02 HISTORY — DX: Other postherpetic nervous system involvement: B02.29

## 2013-10-02 MED ORDER — GABAPENTIN 300 MG PO CAPS: 300.0000 mg | ORAL_CAPSULE | Freq: Three times a day (TID) | ORAL | Status: DC

## 2013-10-02 NOTE — Progress Notes (Signed)
Patient ID: [name], female   DOB: 07/14/1936, 77 y.o.   MRN: [id number]         [facility] for Infectious Disease  Reason for Consult: Postherpetic neuralgia Referring Physician: Dr. Sonda Primes  Patient Active Problem List   Diagnosis Date Noted  . Postherpetic neuralgia 10/02/2013    Priority: High  . Impacted cerumen of right ear 07/16/2013  . Headache(784.0) 07/10/2013  . Aortic stenosis 04/19/2013  . Insomnia 01/24/2013  . Cough 08/07/2012  . URI (upper respiratory infection) 08/03/2012  . Pruritus of skin 03/11/2012  . MVP (mitral valve prolapse)   . Multiple thyroid nodules 10/04/2011  . Herpes zoster 09/07/2011  . Essential hypertension, benign 07/27/2011  . Atrophic vaginitis   . Fibroid   . Well adult exam 03/15/2011  . Hyperlipemia, mixed 03/15/2011  . OVERACTIVE BLADDER 09/02/2010  . HYPOTHYROIDISM 03/03/2010  . CALF PAIN, LEFT 10/27/2009  . FLATULENCE-GAS-BLOATING 07/21/2009  . EAR PAIN 12/19/2008  . ALLERGIC RHINITIS 12/19/2008  . Irritable bowel syndrome 09/04/2008  . PERSONAL HX COLONIC POLYPS 09/04/2008  . GASTRITIS, CHRONIC 09/03/2008  . DUODENITIS, WITHOUT HEMORRHAGE 09/03/2008  . TIBIALIS TENDINITIS 03/20/2008  . OSTEOARTHRITIS 02/25/2008  . SWEATING 02/21/2008  . LACTOSE INTOLERANCE 11/20/2007  . Other and unspecified hyperlipidemia 11/20/2007  . GERD 11/20/2007  . OSTEOPOROSIS 11/20/2007    Patient's Medications  New Prescriptions   No medications on file  Previous Medications   ALBUTEROL (PROVENTIL HFA;VENTOLIN HFA) 108 (90 BASE) MCG/ACT INHALER    Inhale 2 puffs into the lungs every 6 (six) hours as needed for wheezing.   ASPIRIN 81 MG TABLET    Take 81 mg by mouth daily.     BUDESONIDE-FORMOTEROL (SYMBICORT) 80-4.5 MCG/ACT INHALER    Inhale 2 puffs into the lungs 2 (two) times daily.   CALCIUM CARBONATE-VITAMIN D (CALCIUM-VITAMIN D) 500-200 MG-UNIT PER TABLET    Take 3 tablets by mouth daily.    CHOLECALCIFEROL (VITAMIN D)  400 UNITS CAPSULE    Take 400 Units by mouth daily.   DESLORATADINE (CLARINEX) 5 MG TABLET    Take 1 tablet (5 mg total) by mouth daily.   DEXILANT 60 MG CAPSULE    TAKE (1) CAPSULE DAILY.   DEXTROMETHORPHAN-GUAIFENESIN (MUCINEX DM) 30-600 MG PER 12 HR TABLET    Take 1 tablet by mouth every 6 (six) hours.   FLUTICASONE (FLONASE) 50 MCG/ACT NASAL SPRAY    Place 2 sprays into the nose daily.   LEVOTHYROXINE (SYNTHROID, LEVOTHROID) 50 MCG TABLET    Take 1 tablet (50 mcg total) by mouth daily before breakfast.   LORAZEPAM (ATIVAN) 1 MG TABLET    Take 1-2 tablets (1-2 mg total) by mouth 2 (two) times daily as needed for anxiety.   SALINE NASAL SPRAY NA    Place into the nose.   SIMVASTATIN (ZOCOR) 20 MG TABLET    TAKE (2) TABLETS DAILY.  Modified Medications   Modified Medication Previous Medication   GABAPENTIN (NEURONTIN) 300 MG CAPSULE gabapentin (NEURONTIN) 300 MG capsule      Take 1 capsule (300 mg total) by mouth 3 (three) times daily.    Take 300 mg by mouth 3 (three) times daily.   Discontinued Medications   VALACYCLOVIR (VALTREX) 1000 MG TABLET    Take 1 tablet (1,000 mg total) by mouth 3 (three) times daily. For shingles   VALACYCLOVIR (VALTREX) 500 MG TABLET    Take 1 tablet (500 mg total) by mouth 2 (two) times daily.    Recommendations:  1. Continue gabapentin 2. Discontinue valacyclovir   Assessment: [name] has late onset postherpetic neuralgia. Her 2 recent bouts of headache and itching occurred without rash and this is not a true recurrence of shingles. Her improvement has been temporally related to taking gabapentin and I wouldn't urge her to continue this. I have given her written information about postherpetic neuralgia.   HPI: [name] is a 77 y.o. female with a remote history of chickenpox as a child. She received shingles vaccine many years ago but 2 years ago developed classic shingles in the right trigeminal nerve, branch 1 distribution. She had a vesicular rash over  the right side of her scalp involving her year. There was no eye involvement. She had headache and itching of the right side of her scalp and forehead at that time. She was treated with valacyclovir and her rash resolved within 2 weeks and the headache and itching resolved within one month. She had no further problems until August of this year when she had some return of similar headache and itching of her scalp. Sometimes the itching will involve the left side of her scalp. She has not noted any rash at all. She did change her products recently but that occurred after she had a recurrence of the pain and itching. She was started on gabapentin and valacyclovir and improved. She then stopped the medications and headache and itching recurred about one month ago. She restarted valacyclovir but did not notice any improvement. She started back on gabapentin and noted improvement in the headache and itching shortly thereafter.  Review of Systems: Constitutional: negative Eyes: negative Ears, nose, mouth, throat, and face: negative for hearing loss and tinnitus Respiratory: negative Cardiovascular: negative Gastrointestinal: negative Genitourinary:negative    Past Medical History  Diagnosis Date  . GERD (gastroesophageal reflux disease)   . Osteoporosis   . Hyperlipidemia   . OA (osteoarthritis)   . Allergic rhinitis   . Baker's cyst     Left-Dr. Aluisio  . Torn meniscus     bilateral  . MVP (mitral valve prolapse)     Antibiotics required for dental procedures  . Cataract     Dr. Dione Booze  . Overactive bladder   . Atrophic vaginitis   . Fibroid   . Thyroid nodule   . Heart murmur   . Thyroid disease   . IBS (irritable bowel syndrome)     History  Substance Use Topics  . Smoking status: Former Games developer  . Smokeless tobacco: Never Used  . Alcohol Use: 2.5 oz/week    5 drink(s) per week     Comment: Socially    Family History  Problem Relation Age of Onset  . Heart disease Mother     . Hypertension Mother   . Osteoporosis Mother   . Congestive Heart Failure Mother   . Pancreatic cancer Father   . Cancer Father     Prostate  . Colon cancer Neg Hx   . Colon polyps Neg Hx   . Rectal cancer Neg Hx   . Stomach cancer Neg Hx   . Heart attack Brother    Allergies  Allergen Reactions  . Neosporin [Neomycin-Bacitracin Zn-Polymyx]     OBJECTIVE: Blood pressure 124/79, pulse 84, temperature 97.8 F (36.6 C), temperature source Oral, height 5' 2.4" (1.585 m), weight 132 lb 12 oz (60.215 kg). General: She is well dressed and in no distress  Skin: No rash, erythema or other skin lesions Ears: Normal external canals bilaterally Lungs: Clear  Cor: Regular S1 and S2 with no murmurs   Microbiology: No results found for this or any previous visit (from the past 240 hour(s)).  Cliffton Asters, MD [facility]ious Disease [facility] [phone] pager   [phone] cell 10/02/2013, 10:00 AM

## 2013-10-03 ENCOUNTER — Telehealth: Payer: Self-pay | Admitting: *Deleted

## 2013-10-03 NOTE — Telephone Encounter (Signed)
Patient called and advised that the Gabapentin is not working for her. She says she still has the headache and itching and it is not getting any better and she thought Dr Orvan Falconer was going to give her something to make that all go away. Advised her that is one of the meds we usually use to treat shingles pain. She wants Dr Orvan Falconer to call her as she has more questions and I advised her I will tell him her concerns and I will call her back once he gives me an answer as it may be something as simple as adjusting the dose or if he feels that will not help she may need pain management but he will have to decide and he is not available this morning so it may be this afternoon but she will hear back.

## 2013-10-03 NOTE — Telephone Encounter (Signed)
I spoke with Mrs. Rohr today and return I prescribed gabapentin 300 mg to be taken 3 times daily. She has not started that yet. If that dose does not control her post herpetic neuralgia of symptoms there is leeway to increase up to 1800 mg daily.

## 2013-10-10 ENCOUNTER — Ambulatory Visit (INDEPENDENT_AMBULATORY_CARE_PROVIDER_SITE_OTHER): Payer: Medicare Other | Admitting: Podiatry

## 2013-10-10 ENCOUNTER — Encounter: Payer: Self-pay | Admitting: Podiatry

## 2013-10-10 VITALS — BP 121/73 | HR 73 | Resp 15 | Ht 62.0 in | Wt 124.0 lb

## 2013-10-10 DIAGNOSIS — L03039 Cellulitis of unspecified toe: Secondary | ICD-10-CM

## 2013-10-10 DIAGNOSIS — J3089 Other allergic rhinitis: Secondary | ICD-10-CM | POA: Diagnosis not present

## 2013-10-10 DIAGNOSIS — L02619 Cutaneous abscess of unspecified foot: Secondary | ICD-10-CM | POA: Diagnosis not present

## 2013-10-10 MED ORDER — AZITHROMYCIN 250 MG PO TABS: ORAL_TABLET | ORAL | Status: DC

## 2013-10-10 NOTE — Progress Notes (Signed)
Pt states since the last visit, when Dr Charlsie Merles cleaned the area out it has not closed up.

## 2013-10-10 NOTE — Progress Notes (Signed)
Subjective:     Patient ID: [name], female   DOB: Apr 28, 1936, 77 y.o.   MRN: [id number]  Toe Pain    patient states his toe is still hurting me it was red and it was swollen   Review of Systems     Objective:   Physical Exam  Nursing note and vitals reviewed. Constitutional: She is oriented to person, place, and time.  Cardiovascular: Intact distal pulses.   Musculoskeletal: Normal range of motion.  Neurological: She is oriented to person, place, and time.   Patient has a small opening left fourth interspace with keratotic tissue on the outside of the toe it is localized with no proximal erythema edema or lymph node distention    Assessment:     Trauma to the left fourth interspace with possible infection    Plan:     Reviewed condition and using sterile instruments debrided tissue. Advised on soaks and placed on Z-Pak and discuss possibility for surgery if this does not improve

## 2013-10-15 ENCOUNTER — Other Ambulatory Visit: Payer: Self-pay | Admitting: Dermatology

## 2013-10-15 DIAGNOSIS — D485 Neoplasm of uncertain behavior of skin: Secondary | ICD-10-CM | POA: Diagnosis not present

## 2013-10-15 DIAGNOSIS — L821 Other seborrheic keratosis: Secondary | ICD-10-CM | POA: Diagnosis not present

## 2013-10-15 DIAGNOSIS — L988 Other specified disorders of the skin and subcutaneous tissue: Secondary | ICD-10-CM | POA: Diagnosis not present

## 2013-10-23 NOTE — Telephone Encounter (Signed)
OK to fill this prescription with additional refills x5 Thank you!  

## 2013-10-26 NOTE — Telephone Encounter (Signed)
RX called into [place] 

## 2013-10-29 ENCOUNTER — Encounter: Payer: Self-pay | Admitting: Podiatry

## 2013-10-29 ENCOUNTER — Ambulatory Visit (INDEPENDENT_AMBULATORY_CARE_PROVIDER_SITE_OTHER): Payer: Medicare Other | Admitting: Podiatry

## 2013-10-29 ENCOUNTER — Ambulatory Visit (INDEPENDENT_AMBULATORY_CARE_PROVIDER_SITE_OTHER): Payer: Medicare Other

## 2013-10-29 VITALS — BP 131/72 | HR 70 | Resp 12

## 2013-10-29 DIAGNOSIS — R52 Pain, unspecified: Secondary | ICD-10-CM | POA: Diagnosis not present

## 2013-10-29 DIAGNOSIS — M779 Enthesopathy, unspecified: Secondary | ICD-10-CM | POA: Diagnosis not present

## 2013-10-29 DIAGNOSIS — M2040 Other hammer toe(s) (acquired), unspecified foot: Secondary | ICD-10-CM

## 2013-10-29 DIAGNOSIS — L84 Corns and callosities: Secondary | ICD-10-CM

## 2013-10-29 MED ORDER — TRIAMCINOLONE ACETONIDE 10 MG/ML IJ SUSP
10.0000 mg | Freq: Once | INTRAMUSCULAR | Status: AC
  Administered 2013-10-29: 10 mg

## 2013-10-31 ENCOUNTER — Ambulatory Visit: Payer: Medicare Other | Admitting: Podiatry

## 2013-10-31 ENCOUNTER — Other Ambulatory Visit: Payer: Self-pay | Admitting: Internal Medicine

## 2013-10-31 NOTE — Progress Notes (Signed)
Subjective:     Patient ID: [name], female   DOB: April 29, 1936, 77 y.o.   MRN: [id number]  HPI patient presents stating that my fourth and fifth toes are really bothering me and they did not seem to respond to the treatment that done so far. She has not noted any drainage swelling or redness   Review of Systems     Objective:   Physical Exam    neurovascular status intact with inflamed area between the fourth and fifth toe with no active drainage or indications of infection Assessment:     Chronic interspace lesion fourth interspace left which has failed so far to respond conservatively    Plan:     Reviewed her travel schedule and we can not do surgery on this until January. Today I did do a small capsular injection 1 mg Kenalog 5 mg Xylocaine and after complete numbness I debrided the area which seemed to give her relief. The tentative plan is to fix this the beginning of January depending on how it feels

## 2013-11-30 NOTE — Telephone Encounter (Signed)
No other info °

## 2013-12-07 ENCOUNTER — Ambulatory Visit: Payer: Medicare Other | Admitting: Podiatry

## 2013-12-07 ENCOUNTER — Ambulatory Visit (INDEPENDENT_AMBULATORY_CARE_PROVIDER_SITE_OTHER): Payer: Medicare Other

## 2013-12-07 ENCOUNTER — Encounter: Payer: Self-pay | Admitting: Podiatry

## 2013-12-07 VITALS — BP 126/60 | HR 73 | Resp 16

## 2013-12-07 DIAGNOSIS — M2040 Other hammer toe(s) (acquired), unspecified foot: Secondary | ICD-10-CM | POA: Diagnosis not present

## 2013-12-07 DIAGNOSIS — M79609 Pain in unspecified limb: Secondary | ICD-10-CM

## 2013-12-07 DIAGNOSIS — M79672 Pain in left foot: Secondary | ICD-10-CM

## 2013-12-07 DIAGNOSIS — L03039 Cellulitis of unspecified toe: Secondary | ICD-10-CM | POA: Diagnosis not present

## 2013-12-07 DIAGNOSIS — L02619 Cutaneous abscess of unspecified foot: Secondary | ICD-10-CM | POA: Diagnosis not present

## 2013-12-07 MED ORDER — CEPHALEXIN 500 MG PO CAPS: 500.0000 mg | ORAL_CAPSULE | Freq: Four times a day (QID) | ORAL | Status: DC

## 2013-12-07 NOTE — Progress Notes (Signed)
   Subjective:    Patient ID: [name], female    DOB: October 21, 1936, 78 y.o.   MRN: [id number]  HPI Comments: Been seeing dr Paulla Dolly for these toes and i think the fourth one has got worse, left #4 toe, ready to discuss surgery      Review of Systems     Objective:   Physical Exam        Assessment & Plan:

## 2013-12-09 NOTE — Progress Notes (Signed)
Subjective:     Patient ID: [name], female   DOB: Apr 10, 1936, 78 y.o.   MRN: [id number]  HPI patient presents stating that this area has started to hurt me again over the last few days and I know I'm going to need surgery on. States the fourth and fifth toes had been bothering her   Review of Systems     Objective:   Physical Exam Neurovascular status is intact and there is an area of breakdown in the fourth interspace left with mild edema of the fourth and fifth toes with no proximal edema erythema or lymph node distention noted    Assessment:     Probable sealing of this area creating infection which has been localized to the fourth and fifth toes    Plan:     X-ray taken is precautionary measure and today I did debride the area and opened it up with no area for me to culture and I then placed her on antibiotics cephalexin 500 mg 3 times a day for 10 days. I would like to do a partial soft tissue syndactylization with bone removal but want to make sure the area is clear first

## 2013-12-13 ENCOUNTER — Other Ambulatory Visit: Payer: Self-pay | Admitting: Internal Medicine

## 2013-12-13 DIAGNOSIS — L03039 Cellulitis of unspecified toe: Secondary | ICD-10-CM

## 2013-12-13 DIAGNOSIS — L02619 Cutaneous abscess of unspecified foot: Secondary | ICD-10-CM

## 2013-12-13 MED ORDER — AZITHROMYCIN 250 MG PO TABS
ORAL_TABLET | ORAL | Status: DC
Start: 2013-12-13 — End: 2014-03-20

## 2013-12-26 DIAGNOSIS — J209 Acute bronchitis, unspecified: Secondary | ICD-10-CM | POA: Diagnosis not present

## 2013-12-26 DIAGNOSIS — J3089 Other allergic rhinitis: Secondary | ICD-10-CM | POA: Diagnosis not present

## 2013-12-30 ENCOUNTER — Other Ambulatory Visit: Payer: Self-pay | Admitting: Internal Medicine

## 2014-01-09 ENCOUNTER — Encounter: Payer: Self-pay | Admitting: Internal Medicine

## 2014-01-09 ENCOUNTER — Ambulatory Visit: Payer: Medicare Other | Admitting: Internal Medicine

## 2014-01-09 ENCOUNTER — Ambulatory Visit (INDEPENDENT_AMBULATORY_CARE_PROVIDER_SITE_OTHER): Payer: Medicare Other | Admitting: Internal Medicine

## 2014-01-09 VITALS — BP 120/70 | HR 80 | Temp 97.4°F | Resp 16

## 2014-01-09 DIAGNOSIS — B029 Zoster without complications: Secondary | ICD-10-CM

## 2014-01-09 DIAGNOSIS — Z23 Encounter for immunization: Secondary | ICD-10-CM | POA: Diagnosis not present

## 2014-01-09 DIAGNOSIS — E039 Hypothyroidism, unspecified: Secondary | ICD-10-CM

## 2014-01-09 DIAGNOSIS — R51 Headache: Secondary | ICD-10-CM | POA: Diagnosis not present

## 2014-01-09 DIAGNOSIS — E041 Nontoxic single thyroid nodule: Secondary | ICD-10-CM

## 2014-01-09 DIAGNOSIS — K219 Gastro-esophageal reflux disease without esophagitis: Secondary | ICD-10-CM | POA: Diagnosis not present

## 2014-01-09 MED ORDER — LEVOTHYROXINE SODIUM 50 MCG PO TABS: 50.0000 ug | ORAL_TABLET | Freq: Every day | ORAL | Status: DC

## 2014-01-09 NOTE — Progress Notes (Signed)
Pre visit review using our clinic review tool, if applicable. No additional management support is needed unless otherwise documented below in the visit note. 

## 2014-01-09 NOTE — Progress Notes (Signed)
   Subjective:    Headache  This is a recurrent problem. The current episode started in the past 7 days. The problem occurs intermittently. The pain is located in the right unilateral region. The pain is moderate. Associated symptoms include ear pain and a sore throat. Pertinent negatives include no back pain, coughing, hearing loss, neck pain, sinus pressure, tinnitus or weakness. The treatment provided mild relief.     F/u R scalp pain and itching - relapsed F/u bloating - resolved on gluten free diet F/u HTN, dyslipidemia, allergies  Wt Readings from Last 3 Encounters:  10/10/13 124 lb (56.246 kg)  10/02/13 132 lb 12 oz (60.215 kg)  07/09/13 121 lb (54.885 kg)   BP Readings from Last 3 Encounters:  01/09/14 120/70  12/07/13 126/60  10/29/13 131/72    Review of Systems  Constitutional: Negative for diaphoresis, activity change, appetite change, fatigue and unexpected weight change.  HENT: Positive for congestion, ear pain, sore throat and voice change. Negative for facial swelling, hearing loss, nosebleeds, sinus pressure, sneezing, tinnitus and trouble swallowing.   Eyes: Negative.   Respiratory: Negative for apnea, cough, choking, chest tightness and stridor.   Cardiovascular: Negative for palpitations and leg swelling.  Gastrointestinal: Negative.   Genitourinary: Negative.   Musculoskeletal: Negative for arthralgias, back pain, gait problem, joint swelling and neck pain.  Skin: Negative for color change, pallor and wound.  Neurological: Positive for headaches. Negative for weakness.  Hematological: Negative for adenopathy. Does not bruise/bleed easily.  Psychiatric/Behavioral: Negative.        Objective:   Physical Exam  Vitals reviewed. Constitutional: She is oriented to person, place, and time. She appears well-developed and well-nourished.  Non-toxic appearance. She does not have a sickly appearance. She does not appear ill. No distress.  HENT:  Head:  Normocephalic and atraumatic.  Mouth/Throat: Oropharynx is clear and moist. No oropharyngeal exudate.  Slight hoarsness and a slight erythema, no thrush  Eyes: Conjunctivae are normal. Right eye exhibits no discharge. Left eye exhibits no discharge. No scleral icterus.  Neck: Normal range of motion. Neck supple. No JVD present. No tracheal deviation present. No thyromegaly present.  Cardiovascular: Normal rate, regular rhythm, normal heart sounds and intact distal pulses.  Exam reveals no gallop and no friction rub.   No murmur heard. Pulmonary/Chest: Effort normal and breath sounds normal. No stridor. No respiratory distress. She has no wheezes. She has no rales. She exhibits no tenderness.  Abdominal: Soft. Bowel sounds are normal. She exhibits no distension and no mass. There is no tenderness. There is no rebound and no guarding.  Musculoskeletal: Normal range of motion. She exhibits no edema and no tenderness.  Lymphadenopathy:    She has no cervical adenopathy.  Neurological: She is oriented to person, place, and time.  Skin: Skin is warm and dry. No rash noted. She is not diaphoretic. No erythema. No pallor.  Psychiatric: She has a normal mood and affect. Her behavior is normal. Judgment and thought content normal.  No rash   Subjective:        Assessment & Plan:           Assessment & Plan:

## 2014-01-09 NOTE — Assessment & Plan Note (Signed)
No relapse 

## 2014-01-09 NOTE — Assessment & Plan Note (Signed)
Continue with current diet prescription therapy as reflected on the Med list.

## 2014-01-09 NOTE — Assessment & Plan Note (Signed)
On Gabapentin 

## 2014-01-09 NOTE — Assessment & Plan Note (Signed)
Continue with current prescription therapy as reflected on the Med list. Labs  

## 2014-01-10 ENCOUNTER — Ambulatory Visit (INDEPENDENT_AMBULATORY_CARE_PROVIDER_SITE_OTHER): Payer: Medicare Other | Admitting: Podiatry

## 2014-01-10 ENCOUNTER — Encounter: Payer: Self-pay | Admitting: Podiatry

## 2014-01-10 ENCOUNTER — Ambulatory Visit: Payer: Medicare Other

## 2014-01-10 VITALS — BP 131/76 | HR 76 | Resp 16 | Ht 62.0 in | Wt 128.0 lb

## 2014-01-10 DIAGNOSIS — L851 Acquired keratosis [keratoderma] palmaris et plantaris: Secondary | ICD-10-CM

## 2014-01-10 DIAGNOSIS — K219 Gastro-esophageal reflux disease without esophagitis: Secondary | ICD-10-CM

## 2014-01-10 DIAGNOSIS — M2040 Other hammer toe(s) (acquired), unspecified foot: Secondary | ICD-10-CM | POA: Diagnosis not present

## 2014-01-10 DIAGNOSIS — E039 Hypothyroidism, unspecified: Secondary | ICD-10-CM

## 2014-01-10 DIAGNOSIS — R51 Headache: Secondary | ICD-10-CM | POA: Diagnosis not present

## 2014-01-10 DIAGNOSIS — B029 Zoster without complications: Secondary | ICD-10-CM | POA: Diagnosis not present

## 2014-01-10 LAB — HEPATIC FUNCTION PANEL
ALT: 30 U/L (ref 0–35)
AST: 26 U/L (ref 0–37)
Albumin: 3.8 g/dL (ref 3.5–5.2)
Alkaline Phosphatase: 44 U/L (ref 39–117)
Bilirubin, Direct: 0.1 mg/dL (ref 0.0–0.3)
Total Bilirubin: 1 mg/dL (ref 0.3–1.2)
Total Protein: 7.4 g/dL (ref 6.0–8.3)

## 2014-01-10 LAB — LIPID PANEL
Cholesterol: 185 mg/dL (ref 0–200)
HDL: 58.8 mg/dL (ref >39.00)
LDL Cholesterol: 105 mg/dL — ABNORMAL HIGH (ref 0–99)
Total CHOL/HDL Ratio: 3
Triglycerides: 105 mg/dL (ref 0.0–149.0)
VLDL: 21 mg/dL (ref 0.0–40.0)

## 2014-01-10 LAB — BASIC METABOLIC PANEL
BUN: 14 mg/dL (ref 6–23)
CO2: 26 mEq/L (ref 19–32)
Calcium: 8.9 mg/dL (ref 8.4–10.5)
Chloride: 103 mEq/L (ref 96–112)
Creatinine, Ser: 0.7 mg/dL (ref 0.4–1.2)
GFR: 82.49 mL/min (ref >60.00)
Glucose, Bld: 101 mg/dL — ABNORMAL HIGH (ref 70–99)
Potassium: 4.1 mEq/L (ref 3.5–5.1)
Sodium: 137 mEq/L (ref 135–145)

## 2014-01-10 LAB — URINALYSIS
Bilirubin Urine: NEGATIVE
Ketones, ur: NEGATIVE
Leukocytes, UA: NEGATIVE
Nitrite: NEGATIVE
Specific Gravity, Urine: 1.01 (ref 1.000–1.030)
Total Protein, Urine: NEGATIVE
Urine Glucose: NEGATIVE
Urobilinogen, UA: 0.2 (ref 0.0–1.0)
pH: 7.5 (ref 5.0–8.0)

## 2014-01-10 LAB — TSH: TSH: 2.14 u[IU]/mL (ref 0.35–5.50)

## 2014-01-10 NOTE — Progress Notes (Signed)
Subjective:     Patient ID: [name], female   DOB: [birth year], 78 y.o.   MRN: [id number]  HPI patient presents stating I am ready to get this foot fixed it has really been feeling better and is not infected and I think I have a chance to get this done. Patient states to be a couple weeks when she gets back into town but she would like to schedule surgery   Review of Systems     Objective:   Physical Exam Neurovascular status intact with keratotic lesion between the fourth and fifth toes left which has been chronic in nature with periods of breakdown    Assessment:     Chronic soft tissue lesion fourth interspace left foot hammertoe deformity fifth left    Plan:     Reviewed condition and allow patient to read a consent form explaining all possible complications with surgery and the fact there is no long-term guarantees as far as success and that we will do a soft tissue partial syndactylization along with arthroplasty the fifth toe after reading consent form she signed after review and is scheduled for outpatient surgery in the next 3 weeks. Understands total recovery. Can take up to 6 months for any kind of foot surgery

## 2014-01-10 NOTE — Progress Notes (Signed)
Pt presents for consultation surgery of left foot.

## 2014-01-11 ENCOUNTER — Telehealth: Payer: Self-pay | Admitting: *Deleted

## 2014-01-11 NOTE — Telephone Encounter (Signed)
Pt states she left the [facility] pamphlet and would like their phone number.  I gave [phone] to pt.

## 2014-01-15 ENCOUNTER — Encounter: Payer: Self-pay | Admitting: Internal Medicine

## 2014-01-17 ENCOUNTER — Telehealth: Payer: Self-pay | Admitting: *Deleted

## 2014-01-17 NOTE — Telephone Encounter (Signed)
PA for Lorazepam initiated, response within 24-48 hours.

## 2014-01-18 ENCOUNTER — Telehealth: Payer: Self-pay | Admitting: *Deleted

## 2014-01-18 NOTE — Telephone Encounter (Signed)
Lorazepam 1 mg PA is approved through 11/28/2014.

## 2014-02-04 ENCOUNTER — Encounter: Payer: Medicare Other | Admitting: Podiatry

## 2014-02-22 ENCOUNTER — Ambulatory Visit: Payer: Medicare Other | Admitting: Podiatry

## 2014-02-27 ENCOUNTER — Telehealth: Payer: Self-pay | Admitting: *Deleted

## 2014-02-27 ENCOUNTER — Other Ambulatory Visit: Payer: Self-pay | Admitting: Internal Medicine

## 2014-02-27 NOTE — Telephone Encounter (Signed)
I'm scheduled to have surgery on Tuesday.  I have questions I'd like to ask.  The nurse called from the surgical center and made me a little crazy.  I left her a message to call us back tomorrow.

## 2014-02-28 NOTE — Telephone Encounter (Signed)
Spoke to patient-she called this morning. She was concerned about the anesthesia. I informed her typically they do IV sedation with local anesthesia. But if she had any other detailed question regarding what type of anesthesia, she needed to call the surgical center.

## 2014-03-05 ENCOUNTER — Other Ambulatory Visit (HOSPITAL_COMMUNITY): Payer: Self-pay | Admitting: Cardiology

## 2014-03-05 ENCOUNTER — Telehealth (HOSPITAL_COMMUNITY): Payer: Self-pay

## 2014-03-05 DIAGNOSIS — M79609 Pain in unspecified limb: Secondary | ICD-10-CM | POA: Diagnosis not present

## 2014-03-05 DIAGNOSIS — I359 Nonrheumatic aortic valve disorder, unspecified: Secondary | ICD-10-CM

## 2014-03-05 NOTE — Telephone Encounter (Signed)
Called to confirm [name]'s appts for tomorrow at 9 and 10 for echos and 10 and 11 for clinic.  Mrs. Mandler called and asked [place] to confirm.  I left a message with the information.

## 2014-03-06 ENCOUNTER — Ambulatory Visit (HOSPITAL_COMMUNITY)
Admission: RE | Admit: 2014-03-06 | Discharge: 2014-03-06 | Disposition: A | Discharge status: Home / Self Care | Payer: Medicare Other | Source: Ambulatory Visit | Attending: Internal Medicine | Admitting: Internal Medicine

## 2014-03-06 ENCOUNTER — Ambulatory Visit (HOSPITAL_BASED_OUTPATIENT_CLINIC_OR_DEPARTMENT_OTHER)
Admission: RE | Admit: 2014-03-06 | Discharge: 2014-03-06 | Disposition: A | Discharge status: Home / Self Care | Payer: Medicare Other | Source: Ambulatory Visit | Attending: Internal Medicine | Admitting: Internal Medicine

## 2014-03-06 VITALS — BP 122/68 | HR 70

## 2014-03-06 DIAGNOSIS — I359 Nonrheumatic aortic valve disorder, unspecified: Secondary | ICD-10-CM | POA: Insufficient documentation

## 2014-03-06 DIAGNOSIS — I350 Nonrheumatic aortic (valve) stenosis: Secondary | ICD-10-CM

## 2014-03-06 DIAGNOSIS — I341 Nonrheumatic mitral (valve) prolapse: Secondary | ICD-10-CM

## 2014-03-06 DIAGNOSIS — I059 Rheumatic mitral valve disease, unspecified: Secondary | ICD-10-CM

## 2014-03-06 NOTE — Patient Instructions (Signed)
We will contact you in 1 year to schedule your next appointment and echocardiogram  

## 2014-03-06 NOTE — Progress Notes (Signed)
  Echocardiogram 2D Echocardiogram has been performed.  Alyse Low F Kawhi Diebold 03/06/2014, 10:35 AM

## 2014-03-06 NOTE — Addendum Note (Signed)
Encounter addended by: Scarlette Calico, RN on: 03/06/2014 11:27 AM<BR>     Documentation filed: Vitals Section

## 2014-03-06 NOTE — Addendum Note (Signed)
Encounter addended by: Scarlette Calico, RN on: 03/06/2014 11:15 AM<BR>     Documentation filed: Patient Instructions Section

## 2014-03-06 NOTE — Progress Notes (Signed)
Patient ID: [name], female   DOB: 1936-01-06, 78 y.o.   MRN: [id number]  HPI:  [name] is a 78 year old woman with a history of hyperlipidemia, mild mitral valve prolapse with mild mitral regurgitation (echo 12/11).  She has undergone CT [facility] about 10 years ago which was totally normal.   She returns today for routine followup.  Still exercising with trainer. Doing Pilates. No CP, SOB or edema.   Echo today (I reviewed personally) EF 60% mildly calcified AoV. Without AS. Mild MVP trivial MR  Lab Results  Component Value Date   CHOL 185 01/10/2014   HDL 58.80 01/10/2014   LDLCALC 105* 01/10/2014   TRIG 105.0 01/10/2014   CHOLHDL 3 01/10/2014    ROS: All systems negative except as listed in HPI, PMH and Problem List.  Past Medical History  Diagnosis Date  . GERD (gastroesophageal reflux disease)   . Osteoporosis   . Hyperlipidemia   . OA (osteoarthritis)   . Allergic rhinitis   . Baker's cyst     Left-Dr. Aluisio  . Torn meniscus     bilateral  . MVP (mitral valve prolapse)     Antibiotics required for dental procedures  . Cataract     Dr. Dione Booze  . Overactive bladder   . Atrophic vaginitis   . Fibroid   . Thyroid nodule   . Heart murmur   . Thyroid disease   . IBS (irritable bowel syndrome)     Current Outpatient Prescriptions  Medication Sig Dispense Refill  . albuterol (PROVENTIL HFA;VENTOLIN HFA) 108 (90 BASE) MCG/ACT inhaler Inhale 2 puffs into the lungs every 6 (six) hours as needed for wheezing.  1 Inhaler  3  . aspirin 81 MG tablet Take 81 mg by mouth daily.        [name] azithromycin (ZITHROMAX Z-PAK) 250 MG tablet Take as directed  6 each  0  . budesonide-formoterol (SYMBICORT) 80-4.5 MCG/ACT inhaler Inhale 2 puffs into the lungs 2 (two) times daily.  1 Inhaler  5  . Calcium Carbonate-Vitamin D (CALCIUM-VITAMIN D) 500-200 MG-UNIT per tablet Take 3 tablets by mouth daily.       . cephALEXin (KEFLEX) 500 MG capsule Take 1 capsule (500 mg total)  by mouth 4 (four) times daily.  30 capsule  1  . Cholecalciferol (VITAMIN D) 400 UNITS capsule Take 400 Units by mouth daily.      [name] desloratadine (CLARINEX) 5 MG tablet Take 1 tablet (5 mg total) by mouth daily.  1 tablet  11  . DEXILANT 60 MG capsule TAKE (1) CAPSULE DAILY.  30 capsule  3  . dextromethorphan-guaiFENesin (MUCINEX DM) 30-600 MG per 12 hr tablet Take 1 tablet by mouth every 6 (six) hours.      . fluticasone (FLONASE) 50 MCG/ACT nasal spray Place 2 sprays into the nose daily.  16 g  6  . gabapentin (NEURONTIN) 300 MG capsule Take 1 capsule (300 mg total) by mouth 3 (three) times daily.  90 capsule  11  . levothyroxine (SYNTHROID) 50 MCG tablet Take 1 tablet (50 mcg total) by mouth daily before breakfast.  30 tablet  11  . LORazepam (ATIVAN) 1 MG tablet Take 1-2 tablets (1-2 mg total) by mouth 2 (two) times daily as needed for anxiety.  100 tablet  5  . SALINE NASAL SPRAY NA Place into the nose.      . simvastatin (ZOCOR) 20 MG tablet TAKE (2) TABLETS DAILY.  180 tablet  1  No current facility-administered medications for this encounter.     PHYSICAL EXAM: There were no vitals filed for this visit. General:  Well appearing. No resp difficulty HEENT: normal Neck: supple. JVP flat. Carotids 2+ bilaterally; no bruits. No lymphadenopathy or thryomegaly appreciated. Cor: PMI normal. Regular rate & rhythm. No rubs, gallops. Minimal AS murmur. S2 crisp Lungs: clear Abdomen: soft, nontender, nondistended. No hepatosplenomegaly. No bruits or masses. Good bowel sounds. Extremities: no cyanosis, clubbing, rash, edema Neuro: alert & orientedx3, cranial nerves grossly intact. Moves all 4 extremities w/o difficulty. Affect pleasant.   ASSESSMENT & PLAN: 1. Aortic valve thickening  2. MVP, mild  Echo reviewed personally and discussed. Aortic valve is stable. No significant AS. MVP is very mild with trivial MR. Will continue to follow with yearly echos. Coniinue exercise. Lipids look  good.   Jerusalem Brownstein R Wave Calzada,MD 11:12 AM

## 2014-03-08 DIAGNOSIS — M25569 Pain in unspecified knee: Secondary | ICD-10-CM | POA: Diagnosis not present

## 2014-03-08 DIAGNOSIS — M5137 Other intervertebral disc degeneration, lumbosacral region: Secondary | ICD-10-CM | POA: Diagnosis not present

## 2014-03-11 ENCOUNTER — Encounter: Payer: Medicare Other | Admitting: Podiatry

## 2014-03-15 ENCOUNTER — Encounter: Payer: Medicare Other | Admitting: Gynecology

## 2014-03-18 ENCOUNTER — Ambulatory Visit (INDEPENDENT_AMBULATORY_CARE_PROVIDER_SITE_OTHER): Payer: Medicare Other | Admitting: Podiatry

## 2014-03-18 ENCOUNTER — Encounter: Payer: Self-pay | Admitting: Podiatry

## 2014-03-18 VITALS — BP 126/75 | HR 76 | Resp 18

## 2014-03-18 DIAGNOSIS — M2040 Other hammer toe(s) (acquired), unspecified foot: Secondary | ICD-10-CM | POA: Diagnosis not present

## 2014-03-18 NOTE — Progress Notes (Signed)
° °  Subjective:    Patient ID: [name], female    DOB: Mar 04, 1936, 78 y.o.   MRN: [id number]  HPI I just want to talk to Dr Paulla Dolly about how to take care of this left foot    Review of Systems     Objective:   Physical Exam        Assessment & Plan:

## 2014-03-18 NOTE — Progress Notes (Signed)
Subjective:     Patient ID: [name], female   DOB: [birth year], 78 y.o.   MRN: [id number]  HPI patient states that toes are doing better and just trying to prevent surgery at this time   Review of Systems     Objective:   Physical Exam Neurovascular status intact no health history changes noted with area between the fourth and fifth toes left keratotic but not broken down    Assessment:     Chronic interspace lesion left    Plan:     H&P performed and discussed continuation of conservative care with considerations for surgery if this gives her problems again patient wants to go this route and will be seen back as needed

## 2014-03-20 ENCOUNTER — Ambulatory Visit (INDEPENDENT_AMBULATORY_CARE_PROVIDER_SITE_OTHER): Payer: Medicare Other | Admitting: Gynecology

## 2014-03-20 ENCOUNTER — Encounter: Payer: Self-pay | Admitting: Gynecology

## 2014-03-20 VITALS — BP 120/76 | Ht 61.0 in

## 2014-03-20 DIAGNOSIS — M899 Disorder of bone, unspecified: Secondary | ICD-10-CM

## 2014-03-20 DIAGNOSIS — N6019 Diffuse cystic mastopathy of unspecified breast: Secondary | ICD-10-CM

## 2014-03-20 DIAGNOSIS — M949 Disorder of cartilage, unspecified: Secondary | ICD-10-CM | POA: Diagnosis not present

## 2014-03-20 DIAGNOSIS — N952 Postmenopausal atrophic vaginitis: Secondary | ICD-10-CM | POA: Diagnosis not present

## 2014-03-20 DIAGNOSIS — D251 Intramural leiomyoma of uterus: Secondary | ICD-10-CM | POA: Diagnosis not present

## 2014-03-20 DIAGNOSIS — M8580 Other specified disorders of bone density and structure, unspecified site: Secondary | ICD-10-CM

## 2014-03-20 MED ORDER — ESTRADIOL 0.1 MG/GM VA CREA: 1.0000 | TOPICAL_CREAM | VAGINAL | Status: DC

## 2014-03-20 NOTE — Patient Instructions (Signed)
Followup for bone density as scheduled. Followup in 6 months for breast exam. Followup sooner if any issues.

## 2014-03-20 NOTE — Progress Notes (Signed)
[name] 04-06-1936 [id number]        78 y.o.  [id number] for followup exam.  Several issues noted below.  Past medical history,surgical history, problem list, medications, allergies, family history and social history were all reviewed and documented as reviewed in the EPIC chart.  ROS:  12 system ROS performed with pertinent positives and negatives included in the history, assessment and plan.  Included Systems: General, HEENT, Neck, Cardiovascular, Pulmonary, Gastrointestinal, Genitourinary, Musculoskeletal, Dermatologic, Endocrine, Hematological, Neurologic, Psychiatric Additional significant findings : Seasonal allergy symptoms   Exam: Kim assistant Filed Vitals:   03/20/14 0901  BP: 120/76  Height: 5\' 1"  (1.549 m)   General appearance:  Normal affect, orientation and appearance. Skin: Grossly normal HEENT: Normal without gross oral lesions, cervical or supraclavicular adenopathy. Thyroid normal.  Lungs:  Clear without wheezing, rales or rhonchi Cardiac: RR, without RMG Abdominal:  Soft, nontender, without masses, guarding, rebound, organomegaly or hernia Breasts:  Examined lying and sitting without masses, retractions, discharge or axillary adenopathy. Pelvic:  Ext/BUS/vagina with generalized atrophic changes  Cervix atrophic  Uterus grossly normal size midline mobile nontender  Adnexa  Without masses or tenderness    Anus and perineum  Normal   Rectovaginal  Normal sphincter tone without palpated masses or tenderness.    Assessment/Plan:  78 y.o. [name] for followup exam.   1. Atrophic vaginitis/postmenopausal. Patient uses Estrace cream twice weekly and does well with this. I have reviewed the issues of absorption and risks to include stroke heart attack DVT breast and uterine stimulation. Patient's comfortable with continuing and I refilled her times a year. Not having significant hot flashes or night sweats. No vaginal bleeding. Patient knows to report any vaginal  bleeding. 2. Osteopenia. DEXA 06/2012 with T score -2.2. History of prior Fosamax use for a number of years but has been off of this now for 6 years. Repeat DEXA now and patient will followup for this. Increased calcium vitamin D reviewed. 3. History of leiomyoma. Ultrasound 2012 showed several small myomas the largest 26 mm. Exam today is normal. We'll continue to monitor with yearly exams. 4. Pap smear 2013. No Pap smear done today. No history of abnormal Pap smears previously. Reviewed current screening guidelines options to stop screening altogether as she is over the age of 12 versus less frequent screening intervals reviewed. Patient is uncomfortable with stopping altogether. We'll plan a repeat Pap smear next year at 3 year interval. 5. Mammography 05/2013. Plans 3-D mammogram this coming summer. SBE monthly review. She does have very dense breasts and is more comfortable with every 6 month exams. 6. Colonoscopy 2013. Repeat at their recommended interval. 7. Health maintenance. Sees Dr. Alain Marion routinely and no blood work was done. Followup in 6 months for breast exam.  Note: This document was prepared with digital dictation and possible smart phrase technology. Any transcriptional errors that result from this process are unintentional.   [name] Auerbach MD, 9:27 AM 03/20/2014

## 2014-03-21 LAB — URINALYSIS W MICROSCOPIC + REFLEX CULTURE
Bacteria, UA: NONE SEEN
Bilirubin Urine: NEGATIVE
Casts: NONE SEEN
Crystals: NONE SEEN
Glucose, UA: NEGATIVE mg/dL
Hgb urine dipstick: NEGATIVE
Ketones, ur: NEGATIVE mg/dL
Leukocytes, UA: NEGATIVE
Nitrite: NEGATIVE
Protein, ur: NEGATIVE mg/dL
Specific Gravity, Urine: 1.028 (ref 1.005–1.030)
Urobilinogen, UA: 0.2 mg/dL (ref 0.0–1.0)
pH: 5.5 (ref 5.0–8.0)

## 2014-03-28 DIAGNOSIS — Z961 Presence of intraocular lens: Secondary | ICD-10-CM | POA: Diagnosis not present

## 2014-04-03 ENCOUNTER — Ambulatory Visit
Admission: RE | Admit: 2014-04-03 | Discharge: 2014-04-03 | Disposition: A | Discharge status: Home / Self Care | Payer: Medicare Other | Source: Ambulatory Visit | Attending: Internal Medicine | Admitting: Internal Medicine

## 2014-04-03 DIAGNOSIS — E0789 Other specified disorders of thyroid: Secondary | ICD-10-CM | POA: Diagnosis not present

## 2014-04-12 ENCOUNTER — Telehealth (HOSPITAL_COMMUNITY): Payer: Self-pay | Admitting: Cardiology

## 2014-04-12 NOTE — Telephone Encounter (Signed)
Opened in error

## 2014-04-13 ENCOUNTER — Other Ambulatory Visit: Payer: Self-pay | Admitting: Internal Medicine

## 2014-05-14 ENCOUNTER — Encounter: Payer: Self-pay | Admitting: Podiatry

## 2014-05-14 NOTE — Progress Notes (Signed)
Dr Paulla Dolly performed left foot 5th toe hammertoe repair, and left foot webbing procedure on 03/05/2014.

## 2014-05-21 ENCOUNTER — Other Ambulatory Visit: Payer: Self-pay

## 2014-05-21 DIAGNOSIS — Z1231 Encounter for screening mammogram for malignant neoplasm of breast: Secondary | ICD-10-CM

## 2014-05-22 DIAGNOSIS — I781 Nevus, non-neoplastic: Secondary | ICD-10-CM | POA: Diagnosis not present

## 2014-05-22 DIAGNOSIS — L821 Other seborrheic keratosis: Secondary | ICD-10-CM | POA: Diagnosis not present

## 2014-05-22 DIAGNOSIS — D239 Other benign neoplasm of skin, unspecified: Secondary | ICD-10-CM | POA: Diagnosis not present

## 2014-05-22 DIAGNOSIS — Z85828 Personal history of other malignant neoplasm of skin: Secondary | ICD-10-CM | POA: Diagnosis not present

## 2014-05-28 ENCOUNTER — Telehealth: Payer: Self-pay | Admitting: *Deleted

## 2014-05-28 NOTE — Telephone Encounter (Signed)
Rf req for Lorazepam 1 mg 1-2 po bid prn. #100. Last filled 04/01/14 ok to Rf?

## 2014-05-28 NOTE — Telephone Encounter (Signed)
OK to fill this prescription with additional refills x1 Thank you!  

## 2014-05-29 MED ORDER — LORAZEPAM 1 MG PO TABS: 1.0000 mg | ORAL_TABLET | Freq: Two times a day (BID) | ORAL | Status: DC | PRN

## 2014-05-29 NOTE — Telephone Encounter (Signed)
Pt called requesting status of refill on her lorazepam. MD has ok refill will send to [facility]. Notified pt

## 2014-05-30 ENCOUNTER — Other Ambulatory Visit: Payer: Self-pay | Admitting: Gynecology

## 2014-05-30 ENCOUNTER — Ambulatory Visit (INDEPENDENT_AMBULATORY_CARE_PROVIDER_SITE_OTHER): Payer: Medicare Other

## 2014-05-30 ENCOUNTER — Telehealth: Payer: Self-pay | Admitting: *Deleted

## 2014-05-30 DIAGNOSIS — M810 Age-related osteoporosis without current pathological fracture: Secondary | ICD-10-CM

## 2014-05-30 DIAGNOSIS — M67919 Unspecified disorder of synovium and tendon, unspecified shoulder: Secondary | ICD-10-CM | POA: Diagnosis not present

## 2014-05-30 DIAGNOSIS — M25519 Pain in unspecified shoulder: Secondary | ICD-10-CM | POA: Diagnosis not present

## 2014-05-30 DIAGNOSIS — M542 Cervicalgia: Secondary | ICD-10-CM | POA: Diagnosis not present

## 2014-05-30 DIAGNOSIS — M8580 Other specified disorders of bone density and structure, unspecified site: Secondary | ICD-10-CM

## 2014-05-30 NOTE — Telephone Encounter (Signed)
Rf req for Lorazepam 1 mg 1-2 po bid prn # 100. Last filled 04/01/14. Ok to Rf?

## 2014-05-31 NOTE — Telephone Encounter (Signed)
Ok Thx 

## 2014-06-03 ENCOUNTER — Encounter: Payer: Self-pay | Admitting: Gynecology

## 2014-06-03 MED ORDER — LORAZEPAM 1 MG PO TABS: 1.0000 mg | ORAL_TABLET | Freq: Two times a day (BID) | ORAL | Status: DC | PRN

## 2014-06-03 NOTE — Telephone Encounter (Signed)
Done

## 2014-06-07 ENCOUNTER — Telehealth: Payer: Self-pay

## 2014-06-07 NOTE — Telephone Encounter (Signed)
Patient said she received bone density report by mail and she "doesn't understand anything about it".  She said she was told someone would call her about it.  She wants someone to call and explain it to her.

## 2014-06-11 ENCOUNTER — Telehealth: Payer: Self-pay | Admitting: *Deleted

## 2014-06-11 NOTE — Telephone Encounter (Signed)
(  pt aware you are out of the office)  Pt received her Dexa report and noticed it said Osteoporosis, she states her last dexa said osteopenia. Pt would like to know the following:  1. Where the osteoporosis is located?  2. What does significant increase in bone mineral density T-Score spine and hip mean?  3. Should she be doing anything about the osteoporosis (per dexa report it does say repeat in 2 years, I did explain this to patient)

## 2014-06-11 NOTE — Telephone Encounter (Signed)
I would be glad to discuss results in detail.  Recommend OV to discuss.

## 2014-06-11 NOTE — Telephone Encounter (Signed)
Pt informed with the below. 

## 2014-06-11 NOTE — Telephone Encounter (Signed)
Pt called with questions regarding bone density, I left message on pt voicemail to call.

## 2014-06-27 ENCOUNTER — Telehealth: Payer: Self-pay | Admitting: Internal Medicine

## 2014-06-27 ENCOUNTER — Other Ambulatory Visit: Payer: Self-pay | Admitting: Internal Medicine

## 2014-06-27 ENCOUNTER — Other Ambulatory Visit: Payer: Self-pay | Admitting: *Deleted

## 2014-06-27 MED ORDER — SIMVASTATIN 40 MG PO TABS: ORAL_TABLET | ORAL | Status: DC

## 2014-06-27 MED ORDER — IPRATROPIUM BROMIDE 0.06 % NA SOLN: 1.0000 | Freq: Three times a day (TID) | NASAL | Status: DC | PRN

## 2014-06-27 NOTE — Telephone Encounter (Signed)
See 06/11/14 telephone encounter KW CMA

## 2014-06-27 NOTE — Telephone Encounter (Signed)
Left msg on triage stating she is going out of town will need to get refill on her simvastatin & Ipratropium and dexilant. Dr. Henrene Pastor rx the dexilant might have to call them. Called pt no answer LMOM will send the simvastatin & nasal spray to [facility]. Pt will have to contact Dr. Henrene Pastor office to get dexilant...[name]

## 2014-06-27 NOTE — Telephone Encounter (Signed)
Dexilant rx sent to [facility]

## 2014-06-28 ENCOUNTER — Ambulatory Visit: Payer: Medicare Other | Admitting: Gynecology

## 2014-07-03 ENCOUNTER — Ambulatory Visit: Payer: Medicare Other

## 2014-07-16 DIAGNOSIS — Z961 Presence of intraocular lens: Secondary | ICD-10-CM | POA: Diagnosis not present

## 2014-07-16 DIAGNOSIS — H532 Diplopia: Secondary | ICD-10-CM | POA: Diagnosis not present

## 2014-07-16 DIAGNOSIS — H04129 Dry eye syndrome of unspecified lacrimal gland: Secondary | ICD-10-CM | POA: Diagnosis not present

## 2014-07-16 DIAGNOSIS — H1045 Other chronic allergic conjunctivitis: Secondary | ICD-10-CM | POA: Diagnosis not present

## 2014-07-16 DIAGNOSIS — H5000 Unspecified esotropia: Secondary | ICD-10-CM | POA: Diagnosis not present

## 2014-07-17 ENCOUNTER — Encounter: Payer: Self-pay | Admitting: Women's Health

## 2014-07-17 ENCOUNTER — Ambulatory Visit (INDEPENDENT_AMBULATORY_CARE_PROVIDER_SITE_OTHER): Payer: Medicare Other | Admitting: Women's Health

## 2014-07-17 ENCOUNTER — Ambulatory Visit
Admission: RE | Admit: 2014-07-17 | Discharge: 2014-07-17 | Disposition: A | Discharge status: Home / Self Care | Payer: Medicare Other | Source: Ambulatory Visit

## 2014-07-17 DIAGNOSIS — Z1231 Encounter for screening mammogram for malignant neoplasm of breast: Secondary | ICD-10-CM

## 2014-07-17 DIAGNOSIS — N390 Urinary tract infection, site not specified: Secondary | ICD-10-CM

## 2014-07-17 LAB — URINALYSIS W MICROSCOPIC + REFLEX CULTURE
Bilirubin Urine: NEGATIVE
Glucose, UA: NEGATIVE mg/dL
Hgb urine dipstick: NEGATIVE
Ketones, ur: NEGATIVE mg/dL
Leukocytes, UA: NEGATIVE
Nitrite: NEGATIVE
Protein, ur: NEGATIVE mg/dL
Specific Gravity, Urine: 1.015 (ref 1.005–1.030)
Urobilinogen, UA: 0.2 mg/dL (ref 0.0–1.0)
pH: 7 (ref 5.0–8.0)

## 2014-07-17 MED ORDER — PHENAZOPYRIDINE HCL 100 MG PO TABS: 100.0000 mg | ORAL_TABLET | Freq: Three times a day (TID) | ORAL | Status: DC | PRN

## 2014-07-17 MED ORDER — CIPROFLOXACIN HCL 250 MG PO TABS: 250.0000 mg | ORAL_TABLET | Freq: Two times a day (BID) | ORAL | Status: DC

## 2014-07-17 NOTE — Progress Notes (Signed)
Patient ID: [name], female   DOB: 10/18/1936, 78 y.o.   MRN: [id number] Presents with complaint of increased urinary frequency with low abdominal bladder pressure for greater than one week. Denies pain or burning with urination or pain at end of stream. Denies vaginal discharge. Fever. Postmenopausal  with no bleeding. Going to [place] on Friday and is fearful symptoms will increase while a way.  Exam: Appears well. UA negative. External genitalia within normal limits, speculum exam no visible erythema, discharge, atrophic. Bimanual no tenderness mostly bladder area. No CVAT  Urinary frequency with bladder pressure  Plan: Urine culture pending. Pyridium 100mg   3 times daily, prescription, proper use given and reviewed. Prescription for Cipro 250 twice daily for 3 days given, take only if symptoms increase.

## 2014-07-17 NOTE — Patient Instructions (Signed)

## 2014-07-18 ENCOUNTER — Telehealth: Payer: Self-pay

## 2014-07-18 ENCOUNTER — Other Ambulatory Visit: Payer: Self-pay | Admitting: Gynecology

## 2014-07-18 DIAGNOSIS — R928 Other abnormal and inconclusive findings on diagnostic imaging of breast: Secondary | ICD-10-CM

## 2014-07-18 LAB — URINE CULTURE
Colony Count: NO GROWTH
Organism ID, Bacteria: NO GROWTH

## 2014-07-18 NOTE — Telephone Encounter (Signed)
[name], I did not get this call before lunch. This was a note from [employer].  Not sure if triage got this call before lunch but pt is calling  Back and wanted to ask nancy what to do in regards  To the meds "cipro" she gave her yest....wanted to let nancy  Know she is feeling worse today with pressure   Headed out of town tomorrow and would like to talk to  [place] before taking this medication

## 2014-07-18 NOTE — Telephone Encounter (Signed)
Telephone call, states pressure sensation has increased, states minimal relief with Pyridium, pressure symptom returns immediately after voiding. Instructed to take Cipro 250 twice daily will start today, is going out of town tomorrow. Culture results not back. Instructed to call if symptoms persist.

## 2014-07-24 ENCOUNTER — Ambulatory Visit: Payer: Medicare Other | Admitting: Gynecology

## 2014-07-24 ENCOUNTER — Ambulatory Visit
Admission: RE | Admit: 2014-07-24 | Discharge: 2014-07-24 | Disposition: A | Discharge status: Home / Self Care | Payer: Medicare Other | Source: Ambulatory Visit | Attending: Gynecology | Admitting: Gynecology

## 2014-07-24 DIAGNOSIS — N6009 Solitary cyst of unspecified breast: Secondary | ICD-10-CM | POA: Diagnosis not present

## 2014-07-24 DIAGNOSIS — R928 Other abnormal and inconclusive findings on diagnostic imaging of breast: Secondary | ICD-10-CM

## 2014-07-25 ENCOUNTER — Other Ambulatory Visit: Payer: Self-pay | Admitting: Women's Health

## 2014-07-25 ENCOUNTER — Telehealth: Payer: Self-pay | Admitting: *Deleted

## 2014-07-25 DIAGNOSIS — R102 Pelvic and perineal pain: Secondary | ICD-10-CM

## 2014-07-25 NOTE — Telephone Encounter (Signed)
Please refer to urologist.

## 2014-07-25 NOTE — Telephone Encounter (Signed)
Appointment on 08/13/14 at 11:15 am with Dr.Tannebaum, notes faxed.

## 2014-07-25 NOTE — Telephone Encounter (Signed)
Pt was treat wit Cipro on 07/18/14 along with Pyridium c/o pressure and urgency. urine culture was negative, pt would like recommendations? Please advise

## 2014-07-25 NOTE — Telephone Encounter (Signed)
Kim called back and said the below the doctor is off. Pt appointment on 08/27/14 @ 3:30 pm

## 2014-07-26 ENCOUNTER — Telehealth: Payer: Self-pay | Admitting: *Deleted

## 2014-07-26 ENCOUNTER — Other Ambulatory Visit: Payer: Self-pay | Admitting: Women's Health

## 2014-07-26 DIAGNOSIS — R350 Frequency of micturition: Secondary | ICD-10-CM

## 2014-07-26 NOTE — Telephone Encounter (Signed)
Pt has rx, I left the below on pt voicemail to contact front desk to schedule.order placed.

## 2014-07-26 NOTE — Telephone Encounter (Signed)
Pt informed with appointment time below, pt is still having pressure and urgency asked if the pyridium will help with this?

## 2014-07-26 NOTE — Telephone Encounter (Signed)
Pyridium may help with symptoms, she was given a prescription at office visit, if she would like another prescription please call in for her pyridium 100 3 times daily #30. May be best to get gyn [place] also. Please have her schedule here.

## 2014-07-26 NOTE — Telephone Encounter (Signed)
Telephone call to review urinary frequency with pressure problem. Had a negative urine culture. States had relief after Cipro 250 for 3 days for several days but frequency with urinary pressure returned. Has scheduled GYN ultrasound for September 10, would like it to be earlier. Will leave a note for front desk to call if any [place] cancellations. Appointment with Dr. Phineas Real on September 3 to discuss DEXA, we'll recheck clean-catch UA.

## 2014-07-26 NOTE — Telephone Encounter (Signed)
Pt asked if you would call her on her cell# pt is fully aware you are the only provider here today. She has ultrasound scheduled on 08/08/14. I told her everything in previous telephone encounter and she still request to speak with you. Please advise

## 2014-08-01 ENCOUNTER — Ambulatory Visit (INDEPENDENT_AMBULATORY_CARE_PROVIDER_SITE_OTHER): Payer: Medicare Other | Admitting: Gynecology

## 2014-08-01 ENCOUNTER — Encounter: Payer: Self-pay | Admitting: Gynecology

## 2014-08-01 DIAGNOSIS — M810 Age-related osteoporosis without current pathological fracture: Secondary | ICD-10-CM | POA: Diagnosis not present

## 2014-08-01 NOTE — Progress Notes (Signed)
[name] [birth year] [id number]        78 y.o.  [id number] presents to discuss her recent bone density which shows osteoporosis T score -2.5 distal third of the radius. Statistically significant improvement at both hips and spine recognizing L1/L4 measured, L2/L3 discarded due to degenerative changes. Patient actively exercises several times weekly and is active throughout the week.  Past medical history,surgical history, problem list, medications, allergies, family history and social history were all reviewed and documented in the EPIC chart.  Directed ROS with pertinent positives and negatives documented in the history of present illness/assessment and plan.   Assessment/Plan:  78 y.o. [id number] with osteoporosis as measured in the distal third of the radius, osteopenia at both femoral necks although compared to prior bone densities improved. Issues as to whether to treat her based on the radius which is first time measured this year or to monitor and repeat DEXA in 2 years. Increased fracture risk reviewed. Risks versus benefits of treatment also discussed. Ultimately we both agree on monitoring at present checking her vitamin D level today and repeat her DEXA in 2 years.   Note: This document was prepared with digital dictation and possible smart phrase technology. Any transcriptional errors that result from this process are unintentional.   Anastasio Auerbach MD, 10:54 AM 08/01/2014

## 2014-08-01 NOTE — Patient Instructions (Signed)
Followup for annual exam April 2016

## 2014-08-02 ENCOUNTER — Ambulatory Visit (INDEPENDENT_AMBULATORY_CARE_PROVIDER_SITE_OTHER): Payer: Medicare Other | Admitting: Women's Health

## 2014-08-02 ENCOUNTER — Ambulatory Visit (INDEPENDENT_AMBULATORY_CARE_PROVIDER_SITE_OTHER): Payer: Medicare Other

## 2014-08-02 ENCOUNTER — Other Ambulatory Visit: Payer: Self-pay | Admitting: Women's Health

## 2014-08-02 DIAGNOSIS — R102 Pelvic and perineal pain: Secondary | ICD-10-CM

## 2014-08-02 DIAGNOSIS — N859 Noninflammatory disorder of uterus, unspecified: Secondary | ICD-10-CM

## 2014-08-02 DIAGNOSIS — D251 Intramural leiomyoma of uterus: Secondary | ICD-10-CM | POA: Diagnosis not present

## 2014-08-02 DIAGNOSIS — N9489 Other specified conditions associated with female genital organs and menstrual cycle: Secondary | ICD-10-CM | POA: Diagnosis not present

## 2014-08-02 DIAGNOSIS — N858 Other specified noninflammatory disorders of uterus: Secondary | ICD-10-CM

## 2014-08-02 DIAGNOSIS — N83339 Acquired atrophy of ovary and fallopian tube, unspecified side: Secondary | ICD-10-CM

## 2014-08-02 DIAGNOSIS — N83319 Acquired atrophy of ovary, unspecified side: Secondary | ICD-10-CM

## 2014-08-02 DIAGNOSIS — R350 Frequency of micturition: Secondary | ICD-10-CM | POA: Diagnosis not present

## 2014-08-02 LAB — URINALYSIS W MICROSCOPIC + REFLEX CULTURE
Bilirubin Urine: NEGATIVE
Glucose, UA: NEGATIVE mg/dL
Hgb urine dipstick: NEGATIVE
Ketones, ur: NEGATIVE mg/dL
Leukocytes, UA: NEGATIVE
Nitrite: NEGATIVE
Protein, ur: NEGATIVE mg/dL
Specific Gravity, Urine: 1.015 (ref 1.005–1.030)
Urobilinogen, UA: 0.2 mg/dL (ref 0.0–1.0)
pH: 8.5 — ABNORMAL HIGH (ref 5.0–8.0)

## 2014-08-02 LAB — VITAMIN D 25 HYDROXY (VIT D DEFICIENCY, FRACTURES): Vit D, 25-Hydroxy: 41 ng/mL (ref 30–89)

## 2014-08-02 NOTE — Progress Notes (Signed)
Patient ID: [name], female   DOB: 1936/01/29, 78 y.o.   MRN: [id number] Presents for ultrasound. Problem with persistent urinary/bladder pressure with nocturia with a negative urine. Pressure and nocturia are somewhat relieved. History of 2 small fibroids. Postmenopausal with no bleeding no HRT.  Exam: Appears well. UA negative Ultrasound: Transvaginal anteverted fibroid uterus 28 x 20 mm, 27 x 25 mm. Fluid-filled endometrium cavity 22 x 8 mm. Endometrium 5.2 mm. Right and left ovary normal, atrophic. Negative cul-de-sac. Ultrasound 2012 fibroids unchanged, endometrium had been 7 mm.  Bladder/urinary pressure Small fibroid uterus ultrasound unchanged  Plan: Keep scheduled appointment for followup with urologist 08/19/2014.

## 2014-08-05 ENCOUNTER — Encounter: Payer: Self-pay | Admitting: Women's Health

## 2014-08-05 DIAGNOSIS — R531 Weakness: Secondary | ICD-10-CM

## 2014-08-07 ENCOUNTER — Ambulatory Visit (INDEPENDENT_AMBULATORY_CARE_PROVIDER_SITE_OTHER): Payer: Medicare Other | Admitting: Podiatry

## 2014-08-07 ENCOUNTER — Encounter: Payer: Self-pay | Admitting: Podiatry

## 2014-08-07 DIAGNOSIS — L84 Corns and callosities: Secondary | ICD-10-CM

## 2014-08-07 DIAGNOSIS — M779 Enthesopathy, unspecified: Secondary | ICD-10-CM

## 2014-08-07 MED ORDER — TRIAMCINOLONE ACETONIDE 10 MG/ML IJ SUSP: 10.0000 mg | Freq: Once | INTRAMUSCULAR | Status: DC

## 2014-08-08 ENCOUNTER — Ambulatory Visit: Payer: Medicare Other | Admitting: Women's Health

## 2014-08-08 ENCOUNTER — Other Ambulatory Visit: Payer: Medicare Other

## 2014-08-08 NOTE — Progress Notes (Signed)
Subjective:     Patient ID: [name], female   DOB: July 14, 1936, 78 y.o.   MRN: [id number]  HPI patient presents with inflammation and pain between the fourth and fifth toes of the left foot with keratotic lesion formation that has become hard to wear shoe gear with   Review of Systems     Objective:   Physical Exam Neurovascular status intact muscle strength adequate with inflammation and pain in the fourth interspace left with fluid buildup noted and keratotic lesions that are painful in the fourth interspace    Assessment:     Chronic lesion formation with interphalangeal capsulitis fourth digit and fourth MPJ    Plan:     Injected the fourth MPJ 3 mg dexamethasone Kenalog 5 mg Xylocaine and then did deep debridement of lesions with no iatrogenic bleeding noted

## 2014-08-19 DIAGNOSIS — R350 Frequency of micturition: Secondary | ICD-10-CM

## 2014-08-19 DIAGNOSIS — R3989 Other symptoms and signs involving the genitourinary system: Secondary | ICD-10-CM | POA: Diagnosis not present

## 2014-08-19 DIAGNOSIS — R3915 Urgency of urination: Secondary | ICD-10-CM | POA: Diagnosis not present

## 2014-08-19 DIAGNOSIS — N390 Urinary tract infection, site not specified: Secondary | ICD-10-CM | POA: Diagnosis not present

## 2014-08-19 HISTORY — DX: Other symptoms and signs involving the genitourinary system: R39.89

## 2014-08-19 HISTORY — DX: Frequency of micturition: R35.0

## 2014-08-19 HISTORY — DX: Urgency of urination: R39.15

## 2014-08-26 ENCOUNTER — Ambulatory Visit (INDEPENDENT_AMBULATORY_CARE_PROVIDER_SITE_OTHER): Payer: Medicare Other | Admitting: Podiatry

## 2014-08-26 ENCOUNTER — Encounter: Payer: Self-pay | Admitting: Podiatry

## 2014-08-26 DIAGNOSIS — M2040 Other hammer toe(s) (acquired), unspecified foot: Secondary | ICD-10-CM | POA: Diagnosis not present

## 2014-08-26 DIAGNOSIS — L84 Corns and callosities: Secondary | ICD-10-CM

## 2014-08-26 MED ORDER — CEPHALEXIN 500 MG PO CAPS: 500.0000 mg | ORAL_CAPSULE | Freq: Four times a day (QID) | ORAL | Status: DC

## 2014-08-28 ENCOUNTER — Encounter: Payer: Self-pay | Admitting: Gastroenterology

## 2014-08-28 ENCOUNTER — Other Ambulatory Visit: Payer: Self-pay | Admitting: Internal Medicine

## 2014-08-28 NOTE — Progress Notes (Signed)
Subjective:     Patient ID: [name], female   DOB: 07/27/1936, 78 y.o.   MRN: [id number]  HPI patient points to between the fourth and fifth toes on the left foot stating that it has been sore between those toes and she's not sure what is causing   Review of Systems     Objective:   Physical Exam Neurovascular status intact with no other health history changes noted and inflammation with keratotic lesion fourth interspace left foot both on the fourth and fifth does noted    Assessment:     Keratotic lesion secondary to abnormal pressure between the fourth and fifth toes    Plan:     Debridement of tissue and advised on soaks wider shoes and reappoint if symptoms persist

## 2014-08-29 ENCOUNTER — Encounter: Payer: Self-pay | Admitting: Gastroenterology

## 2014-08-29 NOTE — Telephone Encounter (Signed)
Ok to Rf in PCP absence? 

## 2014-08-29 NOTE — Telephone Encounter (Signed)
Done hardcopy to robin  

## 2014-08-30 NOTE — Telephone Encounter (Signed)
Forwarded hardcopy to PCP's CMA.

## 2014-08-30 NOTE — Telephone Encounter (Signed)
Rx faxed to pharmacy  

## 2014-09-02 ENCOUNTER — Telehealth: Payer: Self-pay | Admitting: Internal Medicine

## 2014-09-02 ENCOUNTER — Other Ambulatory Visit (INDEPENDENT_AMBULATORY_CARE_PROVIDER_SITE_OTHER): Payer: Medicare Other

## 2014-09-02 ENCOUNTER — Other Ambulatory Visit: Payer: Self-pay | Admitting: *Deleted

## 2014-09-02 DIAGNOSIS — R531 Weakness: Secondary | ICD-10-CM

## 2014-09-02 DIAGNOSIS — I10 Essential (primary) hypertension: Secondary | ICD-10-CM

## 2014-09-02 DIAGNOSIS — Z0000 Encounter for general adult medical examination without abnormal findings: Secondary | ICD-10-CM

## 2014-09-02 DIAGNOSIS — E038 Other specified hypothyroidism: Secondary | ICD-10-CM

## 2014-09-02 LAB — CBC WITH DIFFERENTIAL/PLATELET
Basophils Absolute: 0 10*3/uL (ref 0.0–0.1)
Basophils Relative: 0.4 % (ref 0.0–3.0)
Eosinophils Absolute: 0.2 10*3/uL (ref 0.0–0.7)
Eosinophils Relative: 3.1 % (ref 0.0–5.0)
HCT: 40.7 % (ref 36.0–46.0)
Hemoglobin: 13.6 g/dL (ref 12.0–15.0)
Lymphocytes Relative: 19.4 % (ref 12.0–46.0)
Lymphs Abs: 1.2 10*3/uL (ref 0.7–4.0)
MCHC: 33.4 g/dL (ref 30.0–36.0)
MCV: 93.8 fl (ref 78.0–100.0)
Monocytes Absolute: 0.6 10*3/uL (ref 0.1–1.0)
Monocytes Relative: 9.7 % (ref 3.0–12.0)
Neutro Abs: 4 10*3/uL (ref 1.4–7.7)
Neutrophils Relative %: 67.4 % (ref 43.0–77.0)
Platelets: 287 10*3/uL (ref 150.0–400.0)
RBC: 4.34 Mil/uL (ref 3.87–5.11)
RDW: 13.9 % (ref 11.5–15.5)
WBC: 6 10*3/uL (ref 4.0–10.5)

## 2014-09-02 LAB — URINALYSIS, ROUTINE W REFLEX MICROSCOPIC
Bilirubin Urine: NEGATIVE
Hgb urine dipstick: NEGATIVE
Ketones, ur: NEGATIVE
Leukocytes, UA: NEGATIVE
Nitrite: NEGATIVE
RBC / HPF: NONE SEEN (ref 0–?)
Specific Gravity, Urine: 1.015 (ref 1.000–1.030)
Total Protein, Urine: NEGATIVE
Urine Glucose: NEGATIVE
Urobilinogen, UA: 0.2 (ref 0.0–1.0)
pH: 6.5 (ref 5.0–8.0)

## 2014-09-02 NOTE — Telephone Encounter (Signed)
Patient is requesting to be worked in Architectural technologist to go over labs.

## 2014-09-02 NOTE — Telephone Encounter (Signed)
appt is set for 09/03/14.

## 2014-09-02 NOTE — Telephone Encounter (Signed)
Ok Thx 

## 2014-09-03 ENCOUNTER — Encounter: Payer: Self-pay | Admitting: Internal Medicine

## 2014-09-03 ENCOUNTER — Telehealth: Payer: Self-pay | Admitting: Internal Medicine

## 2014-09-03 ENCOUNTER — Ambulatory Visit (INDEPENDENT_AMBULATORY_CARE_PROVIDER_SITE_OTHER): Payer: Medicare Other | Admitting: Internal Medicine

## 2014-09-03 VITALS — BP 140/72 | HR 83 | Temp 98.4°F | Resp 18 | Ht 60.0 in

## 2014-09-03 DIAGNOSIS — E039 Hypothyroidism, unspecified: Secondary | ICD-10-CM | POA: Diagnosis not present

## 2014-09-03 DIAGNOSIS — K2100 Gastro-esophageal reflux disease with esophagitis, without bleeding: Secondary | ICD-10-CM

## 2014-09-03 DIAGNOSIS — I10 Essential (primary) hypertension: Secondary | ICD-10-CM

## 2014-09-03 DIAGNOSIS — B029 Zoster without complications: Secondary | ICD-10-CM

## 2014-09-03 DIAGNOSIS — R5383 Other fatigue: Secondary | ICD-10-CM

## 2014-09-03 LAB — HEPATIC FUNCTION PANEL
ALT: 25 U/L (ref 0–35)
AST: 27 U/L (ref 0–37)
Albumin: 4.1 g/dL (ref 3.5–5.2)
Alkaline Phosphatase: 43 U/L (ref 39–117)
Bilirubin, Direct: 0.1 mg/dL (ref 0.0–0.3)
Total Bilirubin: 0.7 mg/dL (ref 0.2–1.2)
Total Protein: 7.6 g/dL (ref 6.0–8.3)

## 2014-09-03 LAB — BASIC METABOLIC PANEL
BUN: 16 mg/dL (ref 6–23)
CO2: 30 mEq/L (ref 19–32)
Calcium: 9.2 mg/dL (ref 8.4–10.5)
Chloride: 101 mEq/L (ref 96–112)
Creatinine, Ser: 0.8 mg/dL (ref 0.4–1.2)
GFR: 72.01 mL/min (ref >60.00)
Glucose, Bld: 51 mg/dL — ABNORMAL LOW (ref 70–99)
Potassium: 4 mEq/L (ref 3.5–5.1)
Sodium: 138 mEq/L (ref 135–145)

## 2014-09-03 NOTE — Telephone Encounter (Signed)
emmi emailed °

## 2014-09-03 NOTE — Progress Notes (Signed)
Pre visit review using our clinic review tool, if applicable. No additional management support is needed unless otherwise documented below in the visit note. 

## 2014-09-03 NOTE — Progress Notes (Signed)
   Subjective:    HPI  C/o fatigue x 3-4 weeks  F/u R scalp pain and itching - relapsed  F/u bloating - resolved on gluten free diet. He had a pelvic US. Now is having cystoscopy and kidney US F/u HTN, dyslipidemia, allergies  Wt Readings from Last 3 Encounters:  01/10/14 128 lb (58.06 kg)  10/10/13 124 lb (56.246 kg)  10/02/13 132 lb 12 oz (60.215 kg)   BP Readings from Last 3 Encounters:  09/03/14 140/72  03/20/14 120/76  03/18/14 126/75    Review of Systems  Constitutional: Negative for diaphoresis, activity change, appetite change, fatigue and unexpected weight change.  HENT: Positive for congestion and voice change. Negative for facial swelling, nosebleeds, sneezing and trouble swallowing.   Eyes: Negative.   Respiratory: Negative for apnea, choking, chest tightness and stridor.   Cardiovascular: Negative for palpitations and leg swelling.  Gastrointestinal: Negative.   Genitourinary: Negative.   Musculoskeletal: Negative for arthralgias, gait problem and joint swelling.  Skin: Negative for color change, pallor and wound.  Hematological: Negative for adenopathy. Does not bruise/bleed easily.  Psychiatric/Behavioral: Negative.        Objective:   Physical Exam  Vitals reviewed. Constitutional: She is oriented to person, place, and time. She appears well-developed and well-nourished.  Non-toxic appearance. She does not have a sickly appearance. She does not appear ill. No distress.  HENT:  Head: Normocephalic and atraumatic.  Mouth/Throat: Oropharynx is clear and moist. No oropharyngeal exudate.  Slight hoarsness and a slight erythema, no thrush  Eyes: Conjunctivae are normal. Right eye exhibits no discharge. Left eye exhibits no discharge. No scleral icterus.  Neck: Normal range of motion. Neck supple. No JVD present. No tracheal deviation present. No thyromegaly present.  Cardiovascular: Normal rate, regular rhythm, normal heart sounds and intact distal pulses.   Exam reveals no gallop and no friction rub.   No murmur heard. Pulmonary/Chest: Effort normal and breath sounds normal. No stridor. No respiratory distress. She has no wheezes. She has no rales. She exhibits no tenderness.  Abdominal: Soft. Bowel sounds are normal. She exhibits no distension and no mass. There is no tenderness. There is no rebound and no guarding.  Musculoskeletal: Normal range of motion. She exhibits no edema and no tenderness.  Lymphadenopathy:    She has no cervical adenopathy.  Neurological: She is oriented to person, place, and time.  Skin: Skin is warm and dry. No rash noted. She is not diaphoretic. No erythema. No pallor.  Psychiatric: She has a normal mood and affect. Her behavior is normal. Judgment and thought content normal.  No rash  Lab Results  Component Value Date   WBC 6.1 07/16/2013   HGB 13.8 07/16/2013   HCT 40.3 07/16/2013   PLT 281.0 07/16/2013   GLUCOSE 101* 01/10/2014   CHOL 185 01/10/2014   TRIG 105.0 01/10/2014   HDL 58.80 01/10/2014   LDLCALC 105* 01/10/2014   ALT 30 01/10/2014   AST 26 01/10/2014   NA 137 01/10/2014   K 4.1 01/10/2014   CL 103 01/10/2014   CREATININE 0.7 01/10/2014   BUN 14 01/10/2014   CO2 26 01/10/2014   TSH 2.14 01/10/2014   HGBA1C 6.0 12/05/2006     Subjective:        Assessment & Plan:           Assessment & Plan:

## 2014-09-03 NOTE — Patient Instructions (Signed)
Take Gabapentin at night only Take Tylenol as needed

## 2014-09-04 LAB — TSH: TSH: 1.55 u[IU]/mL (ref 0.35–4.50)

## 2014-09-05 ENCOUNTER — Ambulatory Visit: Payer: Medicare Other | Admitting: Internal Medicine

## 2014-09-05 DIAGNOSIS — R3989 Other symptoms and signs involving the genitourinary system: Secondary | ICD-10-CM | POA: Diagnosis not present

## 2014-09-05 DIAGNOSIS — R350 Frequency of micturition: Secondary | ICD-10-CM | POA: Diagnosis not present

## 2014-09-05 DIAGNOSIS — R3915 Urgency of urination: Secondary | ICD-10-CM | POA: Diagnosis not present

## 2014-09-08 DIAGNOSIS — R5383 Other fatigue: Secondary | ICD-10-CM | POA: Insufficient documentation

## 2014-09-08 HISTORY — DX: Other fatigue: R53.83

## 2014-09-08 NOTE — Assessment & Plan Note (Signed)
Labs ?post-viral

## 2014-09-08 NOTE — Assessment & Plan Note (Signed)
No relapse 

## 2014-09-08 NOTE — Assessment & Plan Note (Signed)
Continue with current prescription therapy as reflected on the Med list.  

## 2014-09-08 NOTE — Assessment & Plan Note (Signed)
Continue with current prescription therapy as reflected on the Med list. Labs  

## 2014-09-11 ENCOUNTER — Ambulatory Visit: Payer: Medicare Other | Admitting: Gynecology

## 2014-09-16 ENCOUNTER — Other Ambulatory Visit: Payer: Self-pay | Admitting: Orthopedic Surgery

## 2014-09-16 DIAGNOSIS — M75101 Unspecified rotator cuff tear or rupture of right shoulder, not specified as traumatic: Secondary | ICD-10-CM | POA: Diagnosis not present

## 2014-09-16 DIAGNOSIS — M25511 Pain in right shoulder: Secondary | ICD-10-CM

## 2014-09-22 ENCOUNTER — Ambulatory Visit
Admission: RE | Admit: 2014-09-22 | Discharge: 2014-09-22 | Disposition: A | Discharge status: Home / Self Care | Payer: Medicare Other | Source: Ambulatory Visit | Attending: Orthopedic Surgery | Admitting: Orthopedic Surgery

## 2014-09-22 ENCOUNTER — Other Ambulatory Visit: Payer: Medicare Other

## 2014-09-22 DIAGNOSIS — M7521 Bicipital tendinitis, right shoulder: Secondary | ICD-10-CM | POA: Diagnosis not present

## 2014-09-22 DIAGNOSIS — M7591 Shoulder lesion, unspecified, right shoulder: Secondary | ICD-10-CM | POA: Diagnosis not present

## 2014-09-22 DIAGNOSIS — M12811 Other specific arthropathies, not elsewhere classified, right shoulder: Secondary | ICD-10-CM | POA: Diagnosis not present

## 2014-09-22 DIAGNOSIS — M25511 Pain in right shoulder: Secondary | ICD-10-CM

## 2014-09-22 DIAGNOSIS — M75111 Incomplete rotator cuff tear or rupture of right shoulder, not specified as traumatic: Secondary | ICD-10-CM | POA: Diagnosis not present

## 2014-09-24 DIAGNOSIS — M75101 Unspecified rotator cuff tear or rupture of right shoulder, not specified as traumatic: Secondary | ICD-10-CM | POA: Diagnosis not present

## 2014-09-25 ENCOUNTER — Encounter: Payer: Self-pay | Admitting: Gynecology

## 2014-09-25 ENCOUNTER — Ambulatory Visit (INDEPENDENT_AMBULATORY_CARE_PROVIDER_SITE_OTHER): Payer: Medicare Other | Admitting: Gynecology

## 2014-09-25 DIAGNOSIS — N6019 Diffuse cystic mastopathy of unspecified breast: Secondary | ICD-10-CM | POA: Diagnosis not present

## 2014-09-25 NOTE — Patient Instructions (Signed)
Follow up in 6 months when you're due for your annual exam.

## 2014-09-25 NOTE — Progress Notes (Signed)
[name] 1936/03/02 [id number]        78 y.o.  [id number] presents for 6 month breast exam. Patient has very dense breasts and prefers a provider exam every 6 months. Recent mammography negative August 2015.  Past medical history,surgical history, problem list, medications, allergies, family history and social history were all reviewed and documented in the EPIC chart.  Directed ROS with pertinent positives and negatives documented in the history of present illness/assessment and plan.  Exam: Kim assistant General appearance:  Normal Both breast examined lying and sitting without masses, retractions, discharge, adenopathy  Assessment/Plan:  78 y.o. [id number] with fibrocystic breast, normal exam, negative recent mammogram. Follow up in 6 months when she is due for her annual exam.     Anastasio Auerbach MD, 8:26 AM 09/25/2014

## 2014-09-26 DIAGNOSIS — M6281 Muscle weakness (generalized): Secondary | ICD-10-CM | POA: Diagnosis not present

## 2014-09-26 DIAGNOSIS — M75101 Unspecified rotator cuff tear or rupture of right shoulder, not specified as traumatic: Secondary | ICD-10-CM | POA: Diagnosis not present

## 2014-09-26 DIAGNOSIS — M25511 Pain in right shoulder: Secondary | ICD-10-CM | POA: Diagnosis not present

## 2014-09-26 DIAGNOSIS — M25611 Stiffness of right shoulder, not elsewhere classified: Secondary | ICD-10-CM | POA: Diagnosis not present

## 2014-09-30 ENCOUNTER — Encounter: Payer: Self-pay | Admitting: Gynecology

## 2014-10-01 ENCOUNTER — Other Ambulatory Visit: Payer: Self-pay | Admitting: Internal Medicine

## 2014-10-01 DIAGNOSIS — M25611 Stiffness of right shoulder, not elsewhere classified: Secondary | ICD-10-CM | POA: Diagnosis not present

## 2014-10-01 DIAGNOSIS — M75101 Unspecified rotator cuff tear or rupture of right shoulder, not specified as traumatic: Secondary | ICD-10-CM | POA: Diagnosis not present

## 2014-10-01 DIAGNOSIS — M25511 Pain in right shoulder: Secondary | ICD-10-CM | POA: Diagnosis not present

## 2014-10-01 DIAGNOSIS — M6281 Muscle weakness (generalized): Secondary | ICD-10-CM | POA: Diagnosis not present

## 2014-10-02 ENCOUNTER — Other Ambulatory Visit: Payer: Self-pay | Admitting: Internal Medicine

## 2014-10-02 ENCOUNTER — Telehealth: Payer: Self-pay | Admitting: Internal Medicine

## 2014-10-02 MED ORDER — DEXLANSOPRAZOLE 60 MG PO CPDR: 60.0000 mg | DELAYED_RELEASE_CAPSULE | Freq: Every day | ORAL | Status: DC

## 2014-10-02 NOTE — Telephone Encounter (Signed)
Refilled Dexilant 

## 2014-10-03 DIAGNOSIS — M25611 Stiffness of right shoulder, not elsewhere classified: Secondary | ICD-10-CM | POA: Diagnosis not present

## 2014-10-03 DIAGNOSIS — M6281 Muscle weakness (generalized): Secondary | ICD-10-CM | POA: Diagnosis not present

## 2014-10-03 DIAGNOSIS — M75101 Unspecified rotator cuff tear or rupture of right shoulder, not specified as traumatic: Secondary | ICD-10-CM | POA: Diagnosis not present

## 2014-10-03 DIAGNOSIS — M25511 Pain in right shoulder: Secondary | ICD-10-CM | POA: Diagnosis not present

## 2014-10-14 DIAGNOSIS — R350 Frequency of micturition: Secondary | ICD-10-CM | POA: Diagnosis not present

## 2014-10-15 ENCOUNTER — Other Ambulatory Visit: Payer: Self-pay | Admitting: Internal Medicine

## 2014-10-15 DIAGNOSIS — M6281 Muscle weakness (generalized): Secondary | ICD-10-CM | POA: Diagnosis not present

## 2014-10-15 DIAGNOSIS — M25611 Stiffness of right shoulder, not elsewhere classified: Secondary | ICD-10-CM | POA: Diagnosis not present

## 2014-10-15 DIAGNOSIS — M25511 Pain in right shoulder: Secondary | ICD-10-CM | POA: Diagnosis not present

## 2014-10-15 DIAGNOSIS — M75101 Unspecified rotator cuff tear or rupture of right shoulder, not specified as traumatic: Secondary | ICD-10-CM | POA: Diagnosis not present

## 2014-10-17 DIAGNOSIS — H10413 Chronic giant papillary conjunctivitis, bilateral: Secondary | ICD-10-CM | POA: Diagnosis not present

## 2014-10-17 DIAGNOSIS — M25611 Stiffness of right shoulder, not elsewhere classified: Secondary | ICD-10-CM | POA: Diagnosis not present

## 2014-10-17 DIAGNOSIS — H01021 Squamous blepharitis right upper eyelid: Secondary | ICD-10-CM | POA: Diagnosis not present

## 2014-10-17 DIAGNOSIS — M75101 Unspecified rotator cuff tear or rupture of right shoulder, not specified as traumatic: Secondary | ICD-10-CM | POA: Diagnosis not present

## 2014-10-17 DIAGNOSIS — H01022 Squamous blepharitis right lower eyelid: Secondary | ICD-10-CM | POA: Diagnosis not present

## 2014-10-17 DIAGNOSIS — M6281 Muscle weakness (generalized): Secondary | ICD-10-CM | POA: Diagnosis not present

## 2014-10-17 DIAGNOSIS — H04123 Dry eye syndrome of bilateral lacrimal glands: Secondary | ICD-10-CM | POA: Diagnosis not present

## 2014-10-17 DIAGNOSIS — H01024 Squamous blepharitis left upper eyelid: Secondary | ICD-10-CM | POA: Diagnosis not present

## 2014-10-17 DIAGNOSIS — H01025 Squamous blepharitis left lower eyelid: Secondary | ICD-10-CM | POA: Diagnosis not present

## 2014-10-17 DIAGNOSIS — M25511 Pain in right shoulder: Secondary | ICD-10-CM | POA: Diagnosis not present

## 2014-10-17 DIAGNOSIS — H0011 Chalazion right upper eyelid: Secondary | ICD-10-CM | POA: Diagnosis not present

## 2014-10-18 NOTE — Telephone Encounter (Signed)
Call refill into pharmacy spoke with Santiago Glad gave md authorization...[name]

## 2014-10-29 DIAGNOSIS — M75101 Unspecified rotator cuff tear or rupture of right shoulder, not specified as traumatic: Secondary | ICD-10-CM | POA: Diagnosis not present

## 2014-10-29 DIAGNOSIS — M25511 Pain in right shoulder: Secondary | ICD-10-CM | POA: Diagnosis not present

## 2014-10-29 DIAGNOSIS — M25611 Stiffness of right shoulder, not elsewhere classified: Secondary | ICD-10-CM | POA: Diagnosis not present

## 2014-10-29 DIAGNOSIS — M6281 Muscle weakness (generalized): Secondary | ICD-10-CM | POA: Diagnosis not present

## 2014-11-05 DIAGNOSIS — M6281 Muscle weakness (generalized): Secondary | ICD-10-CM | POA: Diagnosis not present

## 2014-11-05 DIAGNOSIS — M25511 Pain in right shoulder: Secondary | ICD-10-CM | POA: Diagnosis not present

## 2014-11-05 DIAGNOSIS — M75101 Unspecified rotator cuff tear or rupture of right shoulder, not specified as traumatic: Secondary | ICD-10-CM | POA: Diagnosis not present

## 2014-11-05 DIAGNOSIS — M25611 Stiffness of right shoulder, not elsewhere classified: Secondary | ICD-10-CM | POA: Diagnosis not present

## 2014-11-07 DIAGNOSIS — M6281 Muscle weakness (generalized): Secondary | ICD-10-CM | POA: Diagnosis not present

## 2014-11-07 DIAGNOSIS — M25611 Stiffness of right shoulder, not elsewhere classified: Secondary | ICD-10-CM | POA: Diagnosis not present

## 2014-11-07 DIAGNOSIS — M25511 Pain in right shoulder: Secondary | ICD-10-CM | POA: Diagnosis not present

## 2014-11-07 DIAGNOSIS — M75101 Unspecified rotator cuff tear or rupture of right shoulder, not specified as traumatic: Secondary | ICD-10-CM | POA: Diagnosis not present

## 2014-11-12 DIAGNOSIS — M75101 Unspecified rotator cuff tear or rupture of right shoulder, not specified as traumatic: Secondary | ICD-10-CM | POA: Diagnosis not present

## 2014-11-12 DIAGNOSIS — M6281 Muscle weakness (generalized): Secondary | ICD-10-CM | POA: Diagnosis not present

## 2014-11-12 DIAGNOSIS — M25611 Stiffness of right shoulder, not elsewhere classified: Secondary | ICD-10-CM | POA: Diagnosis not present

## 2014-11-12 DIAGNOSIS — M25511 Pain in right shoulder: Secondary | ICD-10-CM | POA: Diagnosis not present

## 2014-11-14 ENCOUNTER — Other Ambulatory Visit: Payer: Self-pay | Admitting: Internal Medicine

## 2014-11-14 DIAGNOSIS — J069 Acute upper respiratory infection, unspecified: Secondary | ICD-10-CM | POA: Diagnosis not present

## 2014-11-14 DIAGNOSIS — J4520 Mild intermittent asthma, uncomplicated: Secondary | ICD-10-CM | POA: Diagnosis not present

## 2014-11-14 DIAGNOSIS — J3089 Other allergic rhinitis: Secondary | ICD-10-CM | POA: Diagnosis not present

## 2014-11-14 DIAGNOSIS — T63441A Toxic effect of venom of bees, accidental (unintentional), initial encounter: Secondary | ICD-10-CM | POA: Diagnosis not present

## 2014-11-15 DIAGNOSIS — M25511 Pain in right shoulder: Secondary | ICD-10-CM | POA: Diagnosis not present

## 2014-11-15 DIAGNOSIS — M6281 Muscle weakness (generalized): Secondary | ICD-10-CM | POA: Diagnosis not present

## 2014-11-15 DIAGNOSIS — M75101 Unspecified rotator cuff tear or rupture of right shoulder, not specified as traumatic: Secondary | ICD-10-CM | POA: Diagnosis not present

## 2014-11-15 DIAGNOSIS — M25611 Stiffness of right shoulder, not elsewhere classified: Secondary | ICD-10-CM | POA: Diagnosis not present

## 2014-12-02 DIAGNOSIS — M25511 Pain in right shoulder: Secondary | ICD-10-CM | POA: Diagnosis not present

## 2014-12-02 DIAGNOSIS — M75101 Unspecified rotator cuff tear or rupture of right shoulder, not specified as traumatic: Secondary | ICD-10-CM | POA: Diagnosis not present

## 2014-12-02 DIAGNOSIS — M6281 Muscle weakness (generalized): Secondary | ICD-10-CM | POA: Diagnosis not present

## 2014-12-02 DIAGNOSIS — M25611 Stiffness of right shoulder, not elsewhere classified: Secondary | ICD-10-CM | POA: Diagnosis not present

## 2014-12-04 ENCOUNTER — Encounter: Payer: Self-pay | Admitting: Internal Medicine

## 2014-12-04 ENCOUNTER — Ambulatory Visit (INDEPENDENT_AMBULATORY_CARE_PROVIDER_SITE_OTHER): Payer: Medicare Other | Admitting: Internal Medicine

## 2014-12-04 VITALS — BP 122/62 | HR 84 | Ht 61.5 in | Wt 128.0 lb

## 2014-12-04 DIAGNOSIS — K589 Irritable bowel syndrome without diarrhea: Secondary | ICD-10-CM

## 2014-12-04 DIAGNOSIS — K219 Gastro-esophageal reflux disease without esophagitis: Secondary | ICD-10-CM

## 2014-12-04 MED ORDER — DEXLANSOPRAZOLE 60 MG PO CPDR: 60.0000 mg | DELAYED_RELEASE_CAPSULE | Freq: Every day | ORAL | Status: DC

## 2014-12-04 NOTE — Patient Instructions (Signed)
We have sent the following medications to your pharmacy for you to pick up at your convenience:  Dexilant  Please follow up with Dr. Henrene Pastor in 2 years

## 2014-12-04 NOTE — Progress Notes (Signed)
HISTORY OF PRESENT ILLNESS:  [name] is a 79 y.o. female  with past medical history as listed below. She has been followed in this office for GERD and IBS. She presents today for routine follow-up and requests medication refill. The patient was last evaluated in the office 05/24/2012. See that dictation. She subsequently underwent surveillance colonoscopy 06/28/2012. The examination was normal except for a few scattered diverticula and small internal hemorrhoids. No routine follow-up planned. At that time, she had been on self-imposed gluten-free diet. This made her feel better. Recently has gone off gluten and noticed more dyspeptic and bloating-type complaints. She also notices bloating with certain foods. Her GERD symptoms are controlled with Dexilant 60 mg daily. She denies dysphagia. Her weight is well controlled. Gen. medical problems are stable. Laboratories from October have been reviewed and these are unremarkable including comprehensive metabolic panel and CBC and TSH. Last EGD with Dr. Velora Heckler 2003 without significant abnormality  REVIEW OF SYSTEMS:  All non-GI ROS negative upon comprehensive and complete review  Past Medical History  Diagnosis Date  . GERD (gastroesophageal reflux disease)   . Osteoporosis 05/2014    T score distal third radius -2.5  Spine, left and right hip all show statistically significant improvement.  . Hyperlipidemia   . OA (osteoarthritis)   . Allergic rhinitis   . Baker's cyst     Left-Dr. Aluisio  . Torn meniscus     bilateral  . MVP (mitral valve prolapse)     Antibiotics required for dental procedures  . Cataract     Dr. Katy Fitch  . Overactive bladder   . Atrophic vaginitis   . Fibroid   . Thyroid nodule   . Heart murmur   . Thyroid disease   . IBS (irritable bowel syndrome)   . Colon polyps   . Gastritis     chronic    Past Surgical History  Procedure Laterality Date  . Cataract extraction, bilateral    . Tonsillectomy      Social  History [name]  reports that she has quit smoking. She has never used smokeless tobacco. She reports that she drinks about 2.5 oz of alcohol per week. She reports that she does not use illicit drugs.  family history includes Congestive Heart Failure in her mother; Heart attack in her brother; Heart disease in her mother; Hypertension in her mother; Osteoporosis in her mother; Pancreatic cancer in her father. There is no history of Colon cancer, Colon polyps, Rectal cancer, or Stomach cancer.  Allergies  Allergen Reactions  . Bee Venom Itching, Swelling and Rash    Itching, swelling and rash with bee stings, patient has epi pen  . Neosporin [Neomycin-Bacitracin Zn-Polymyx]        PHYSICAL EXAMINATION: Vital signs: BP 122/62 mmHg  Pulse 84  Ht 5' 1.5" (1.562 m)  Wt 128 lb (58.06 kg)  BMI 23.80 kg/m2 General: Well-developed, well-nourished, no acute distress HEENT: Sclerae are anicteric, conjunctiva pink. Oral mucosa intact Lungs: Clear Heart: Regular Abdomen: soft, nontender, nondistended, no obvious ascites, no peritoneal signs, normal bowel sounds. No organomegaly. Extremities: No edema Psychiatric: alert and oriented x3. Cooperative  ASSESSMENT:  #1. GERD. Symptoms controlled with Dexilant #2. IBS. Stable #3. Colon cancer screening. Up-to-date. Aged out of surveillance   PLAN:  #1. Reflux precautions #2. Continue Dexilant. Prescription refilled #3. Routine GI follow-up 2 years. Sooner if needed

## 2014-12-06 DIAGNOSIS — M25511 Pain in right shoulder: Secondary | ICD-10-CM | POA: Diagnosis not present

## 2014-12-06 DIAGNOSIS — M25611 Stiffness of right shoulder, not elsewhere classified: Secondary | ICD-10-CM | POA: Diagnosis not present

## 2014-12-06 DIAGNOSIS — M75101 Unspecified rotator cuff tear or rupture of right shoulder, not specified as traumatic: Secondary | ICD-10-CM | POA: Diagnosis not present

## 2014-12-06 DIAGNOSIS — M6281 Muscle weakness (generalized): Secondary | ICD-10-CM | POA: Diagnosis not present

## 2014-12-09 DIAGNOSIS — M75101 Unspecified rotator cuff tear or rupture of right shoulder, not specified as traumatic: Secondary | ICD-10-CM | POA: Diagnosis not present

## 2014-12-12 ENCOUNTER — Telehealth: Payer: Self-pay | Admitting: *Deleted

## 2014-12-12 NOTE — Telephone Encounter (Signed)
Pt feels certain she received this season's flu vaccine. I advised her I could document the vaccine historically and be done or she could come in for a nurse visit. She is going to think about it and call me back if she decides to come get Flu vaccine.

## 2014-12-12 NOTE — Telephone Encounter (Signed)
No, it won't harm to get another one Thx

## 2014-12-12 NOTE — Telephone Encounter (Signed)
Pt feels certain she received her flu shot at 09/03/2014 OV. However, there is no documentation. She wants to know if MD thinks she should come get flu vaccine now and will it harm her if she has already had this season's vaccine??

## 2014-12-17 ENCOUNTER — Ambulatory Visit: Payer: Medicare Other

## 2014-12-17 DIAGNOSIS — Z23 Encounter for immunization: Secondary | ICD-10-CM

## 2014-12-31 ENCOUNTER — Encounter: Payer: Medicare Other | Admitting: Internal Medicine

## 2015-01-01 ENCOUNTER — Ambulatory Visit (HOSPITAL_COMMUNITY)
Admission: RE | Admit: 2015-01-01 | Discharge: 2015-01-01 | Disposition: A | Discharge status: Home / Self Care | Payer: Medicare Other | Source: Ambulatory Visit | Attending: Orthopedic Surgery | Admitting: Orthopedic Surgery

## 2015-01-01 ENCOUNTER — Other Ambulatory Visit: Payer: Self-pay | Admitting: Orthopedic Surgery

## 2015-01-01 ENCOUNTER — Encounter (HOSPITAL_COMMUNITY): Payer: Self-pay

## 2015-01-01 ENCOUNTER — Encounter: Payer: Medicare Other | Admitting: Internal Medicine

## 2015-01-01 DIAGNOSIS — R1012 Left upper quadrant pain: Principal | ICD-10-CM

## 2015-01-01 DIAGNOSIS — S2232XA Fracture of one rib, left side, initial encounter for closed fracture: Secondary | ICD-10-CM | POA: Diagnosis not present

## 2015-01-01 DIAGNOSIS — G8929 Other chronic pain: Secondary | ICD-10-CM

## 2015-01-01 DIAGNOSIS — M25511 Pain in right shoulder: Secondary | ICD-10-CM | POA: Diagnosis not present

## 2015-01-01 DIAGNOSIS — M75101 Unspecified rotator cuff tear or rupture of right shoulder, not specified as traumatic: Secondary | ICD-10-CM | POA: Diagnosis not present

## 2015-01-01 DIAGNOSIS — S3600XA Unspecified injury of spleen, initial encounter: Secondary | ICD-10-CM | POA: Diagnosis not present

## 2015-01-01 MED ORDER — IOHEXOL 300 MG/ML  SOLN
100.0000 mL | Freq: Once | INTRAMUSCULAR | Status: AC | PRN
  Administered 2015-01-01: 100 mL via INTRAVENOUS

## 2015-01-02 ENCOUNTER — Ambulatory Visit (HOSPITAL_COMMUNITY): Payer: Medicare Other

## 2015-01-03 ENCOUNTER — Other Ambulatory Visit (INDEPENDENT_AMBULATORY_CARE_PROVIDER_SITE_OTHER): Payer: Medicare Other

## 2015-01-03 ENCOUNTER — Other Ambulatory Visit: Payer: Self-pay | Admitting: Internal Medicine

## 2015-01-03 ENCOUNTER — Other Ambulatory Visit: Payer: Self-pay | Admitting: *Deleted

## 2015-01-03 DIAGNOSIS — E782 Mixed hyperlipidemia: Secondary | ICD-10-CM

## 2015-01-03 DIAGNOSIS — I10 Essential (primary) hypertension: Secondary | ICD-10-CM | POA: Diagnosis not present

## 2015-01-03 DIAGNOSIS — E039 Hypothyroidism, unspecified: Secondary | ICD-10-CM | POA: Diagnosis not present

## 2015-01-03 DIAGNOSIS — Z0000 Encounter for general adult medical examination without abnormal findings: Secondary | ICD-10-CM | POA: Diagnosis not present

## 2015-01-03 DIAGNOSIS — R350 Frequency of micturition: Secondary | ICD-10-CM

## 2015-01-03 LAB — LIPID PANEL
Cholesterol: 197 mg/dL (ref 0–200)
HDL: 57.1 mg/dL (ref >39.00)
LDL Cholesterol: 103 mg/dL — ABNORMAL HIGH (ref 0–99)
NonHDL: 139.9
Total CHOL/HDL Ratio: 3
Triglycerides: 186 mg/dL — ABNORMAL HIGH (ref 0.0–149.0)
VLDL: 37.2 mg/dL (ref 0.0–40.0)

## 2015-01-03 LAB — HEPATIC FUNCTION PANEL
ALT: 30 U/L (ref 0–35)
AST: 25 U/L (ref 0–37)
Albumin: 4.2 g/dL (ref 3.5–5.2)
Alkaline Phosphatase: 55 U/L (ref 39–117)
Bilirubin, Direct: 0.1 mg/dL (ref 0.0–0.3)
Total Bilirubin: 0.7 mg/dL (ref 0.2–1.2)
Total Protein: 7.6 g/dL (ref 6.0–8.3)

## 2015-01-03 LAB — URINALYSIS, ROUTINE W REFLEX MICROSCOPIC
Bilirubin Urine: NEGATIVE
Hgb urine dipstick: NEGATIVE
Ketones, ur: NEGATIVE
Leukocytes, UA: NEGATIVE
Nitrite: NEGATIVE
Specific Gravity, Urine: 1.01 (ref 1.000–1.030)
Total Protein, Urine: NEGATIVE
Urine Glucose: NEGATIVE
Urobilinogen, UA: 0.2 (ref 0.0–1.0)
pH: 7.5 (ref 5.0–8.0)

## 2015-01-03 LAB — BASIC METABOLIC PANEL
BUN: 14 mg/dL (ref 6–23)
CO2: 25 mEq/L (ref 19–32)
Calcium: 9.6 mg/dL (ref 8.4–10.5)
Chloride: 104 mEq/L (ref 96–112)
Creatinine, Ser: 0.79 mg/dL (ref 0.40–1.20)
GFR: 75.1 mL/min (ref >60.00)
Glucose, Bld: 92 mg/dL (ref 70–99)
Potassium: 4.3 mEq/L (ref 3.5–5.1)
Sodium: 141 mEq/L (ref 135–145)

## 2015-01-03 LAB — TSH: TSH: 4.87 u[IU]/mL — ABNORMAL HIGH (ref 0.35–4.50)

## 2015-01-03 LAB — CBC WITH DIFFERENTIAL/PLATELET
Basophils Absolute: 0 10*3/uL (ref 0.0–0.1)
Basophils Relative: 0.7 % (ref 0.0–3.0)
Eosinophils Absolute: 0.2 10*3/uL (ref 0.0–0.7)
Eosinophils Relative: 3.5 % (ref 0.0–5.0)
HCT: 41.3 % (ref 36.0–46.0)
Hemoglobin: 14.1 g/dL (ref 12.0–15.0)
Lymphocytes Relative: 16.6 % (ref 12.0–46.0)
Lymphs Abs: 0.9 10*3/uL (ref 0.7–4.0)
MCHC: 34.1 g/dL (ref 30.0–36.0)
MCV: 91.8 fl (ref 78.0–100.0)
Monocytes Absolute: 0.6 10*3/uL (ref 0.1–1.0)
Monocytes Relative: 10.6 % (ref 3.0–12.0)
Neutro Abs: 3.7 10*3/uL (ref 1.4–7.7)
Neutrophils Relative %: 68.6 % (ref 43.0–77.0)
Platelets: 240 10*3/uL (ref 150.0–400.0)
RBC: 4.5 Mil/uL (ref 3.87–5.11)
RDW: 14 % (ref 11.5–15.5)
WBC: 5.4 10*3/uL (ref 4.0–10.5)

## 2015-01-03 NOTE — Telephone Encounter (Signed)
Refill done.  

## 2015-01-06 ENCOUNTER — Other Ambulatory Visit: Payer: Medicare Other

## 2015-01-06 DIAGNOSIS — J069 Acute upper respiratory infection, unspecified: Secondary | ICD-10-CM | POA: Diagnosis not present

## 2015-01-06 DIAGNOSIS — J4520 Mild intermittent asthma, uncomplicated: Secondary | ICD-10-CM | POA: Diagnosis not present

## 2015-01-06 DIAGNOSIS — J3089 Other allergic rhinitis: Secondary | ICD-10-CM | POA: Diagnosis not present

## 2015-01-06 DIAGNOSIS — T63441A Toxic effect of venom of bees, accidental (unintentional), initial encounter: Secondary | ICD-10-CM | POA: Diagnosis not present

## 2015-01-06 LAB — T4, FREE: Free T4: 1.11 ng/dL (ref 0.60–1.60)

## 2015-01-08 ENCOUNTER — Encounter: Payer: Self-pay | Admitting: Internal Medicine

## 2015-01-08 ENCOUNTER — Ambulatory Visit (INDEPENDENT_AMBULATORY_CARE_PROVIDER_SITE_OTHER): Payer: Medicare Other | Admitting: Internal Medicine

## 2015-01-08 VITALS — BP 144/92 | HR 87 | Temp 97.5°F | Resp 16 | Ht 62.0 in

## 2015-01-08 DIAGNOSIS — S20212D Contusion of left front wall of thorax, subsequent encounter: Secondary | ICD-10-CM | POA: Diagnosis not present

## 2015-01-08 DIAGNOSIS — Z0000 Encounter for general adult medical examination without abnormal findings: Secondary | ICD-10-CM | POA: Diagnosis not present

## 2015-01-08 DIAGNOSIS — R5383 Other fatigue: Secondary | ICD-10-CM | POA: Diagnosis not present

## 2015-01-08 DIAGNOSIS — I10 Essential (primary) hypertension: Secondary | ICD-10-CM

## 2015-01-08 MED ORDER — LOSARTAN POTASSIUM 50 MG PO TABS: 50.0000 mg | ORAL_TABLET | Freq: Every day | ORAL | Status: DC

## 2015-01-08 NOTE — Progress Notes (Signed)
Subjective:    HPI    The patient is here for a wellness exam.    F/u bloating - resolved on gluten free diet. He had a pelvic US. Now is having cystoscopy and kidney US F/u HTN, dyslipidemia, allergies  Tajae fell and broke a rib #8 on the L last week, she had a rib X ray and a CT (Dr Noemi Chapel). Not feeling well x a few days. On Sun used Flonase and lips felt tingly, mouth was dry. It happened twice. She saw Dr Doristine Johns (Allergy)  Wt Readings from Last 3 Encounters:  12/04/14 128 lb (58.06 kg)  01/10/14 128 lb (58.06 kg)  10/10/13 124 lb (56.246 kg)   BP Readings from Last 3 Encounters:  01/08/15 144/92  12/04/14 122/62  09/03/14 140/72    Review of Systems  Constitutional: Negative for diaphoresis, activity change, appetite change, fatigue and unexpected weight change.  HENT: Positive for congestion and voice change. Negative for facial swelling, nosebleeds, sneezing and trouble swallowing.   Eyes: Negative.   Respiratory: Negative for apnea, choking, chest tightness and stridor.   Cardiovascular: Negative for palpitations and leg swelling.  Gastrointestinal: Negative.   Genitourinary: Negative.   Musculoskeletal: Negative for joint swelling, arthralgias and gait problem.  Skin: Negative for color change, pallor and wound.  Hematological: Negative for adenopathy. Does not bruise/bleed easily.  Psychiatric/Behavioral: Negative.        Objective:   Physical Exam  Constitutional: She is oriented to person, place, and time. She appears well-developed and well-nourished.  Non-toxic appearance. She does not have a sickly appearance. She does not appear ill. No distress.  HENT:  Head: Normocephalic and atraumatic.  Mouth/Throat: Oropharynx is clear and moist. No oropharyngeal exudate.  Slight hoarsness and a slight erythema, no thrush  Eyes: Conjunctivae are normal. Right eye exhibits no discharge. Left eye exhibits no discharge. No scleral icterus.  Neck: Normal range  of motion. Neck supple. No JVD present. No tracheal deviation present. No thyromegaly present.  Cardiovascular: Normal rate, regular rhythm, normal heart sounds and intact distal pulses.  Exam reveals no gallop and no friction rub.   No murmur heard. Pulmonary/Chest: Effort normal and breath sounds normal. No stridor. No respiratory distress. She has no wheezes. She has no rales. She exhibits no tenderness.  Abdominal: Soft. Bowel sounds are normal. She exhibits no distension and no mass. There is no tenderness. There is no rebound and no guarding.  Musculoskeletal: Normal range of motion. She exhibits no edema or tenderness.  Lymphadenopathy:    She has no cervical adenopathy.  Neurological: She is oriented to person, place, and time.  Skin: Skin is warm and dry. No rash noted. She is not diaphoretic. No erythema. No pallor.  Psychiatric: She has a normal mood and affect. Her behavior is normal. Judgment and thought content normal.  Vitals reviewed. No rash Bruised L flank - tender  Lab Results  Component Value Date   WBC 5.4 01/03/2015   HGB 14.1 01/03/2015   HCT 41.3 01/03/2015   PLT 240.0 01/03/2015   GLUCOSE 92 01/03/2015   CHOL 197 01/03/2015   TRIG 186.0* 01/03/2015   HDL 57.10 01/03/2015   LDLCALC 103* 01/03/2015   ALT 30 01/03/2015   AST 25 01/03/2015   NA 141 01/03/2015   K 4.3 01/03/2015   CL 104 01/03/2015   CREATININE 0.79 01/03/2015   BUN 14 01/03/2015   CO2 25 01/03/2015   TSH 4.87* 01/03/2015   HGBA1C 6.0 12/05/2006  Subjective:        Assessment & Plan:           Assessment & Plan:

## 2015-01-08 NOTE — Progress Notes (Signed)
Pre visit review using our clinic review tool, if applicable. No additional management support is needed unless otherwise documented below in the visit note. 

## 2015-01-09 ENCOUNTER — Encounter: Payer: Self-pay | Admitting: Internal Medicine

## 2015-01-09 ENCOUNTER — Other Ambulatory Visit: Payer: Self-pay | Admitting: Internal Medicine

## 2015-01-09 DIAGNOSIS — S20212A Contusion of left front wall of thorax, initial encounter: Secondary | ICD-10-CM

## 2015-01-09 HISTORY — DX: Contusion of left front wall of thorax, initial encounter: S20.212A

## 2015-01-09 NOTE — Assessment & Plan Note (Signed)
Poss due to a chest wall contusion Will watch

## 2015-01-09 NOTE — Assessment & Plan Note (Signed)
Continue with current prescription therapy as reflected on the Med list.  

## 2015-01-09 NOTE — Assessment & Plan Note (Signed)
Here for medicare wellness/physical  Diet: heart healthy  Physical activity: not sedentary - very active Depression/mood screen: negative  Hearing: intact to whispered voice  Visual acuity: grossly normal, performs annual eye exam  ADLs: capable  Fall risk: none  Home safety: good  Cognitive evaluation: intact to orientation, naming, recall and repetition  EOL planning: adv directives, full code/ I agree  I have personally reviewed and have noted  1. The patient's medical and social history  2. Their use of alcohol, tobacco or illicit drugs  3. Their current medications and supplements  4. The patient's functional ability including ADL's, fall risks, home safety risks and hearing or visual impairment.  5. Diet and physical activities  6. Evidence for depression or mood disorders    Today patient counseled on age appropriate routine health concerns for screening and prevention, each reviewed and up to date or declined. Immunizations reviewed and up to date or declined. Labs ordered and reviewed. Risk factors for depression reviewed and negative. Hearing function and visual acuity are intact. ADLs screened and addressed as needed. Functional ability and level of safety reviewed and appropriate. Education, counseling and referrals performed based on assessed risks today. Patient provided with a copy of personalized plan for preventive services.

## 2015-01-09 NOTE — Assessment & Plan Note (Signed)
[name] fell and broke a rib #8 on the L last week, she had a rib X ray and a CT (Dr Noemi Chapel).  Rib belt

## 2015-02-10 ENCOUNTER — Encounter: Payer: Self-pay | Admitting: Internal Medicine

## 2015-02-10 ENCOUNTER — Ambulatory Visit (INDEPENDENT_AMBULATORY_CARE_PROVIDER_SITE_OTHER): Payer: Medicare Other | Admitting: Internal Medicine

## 2015-02-10 VITALS — BP 108/78 | HR 93 | Temp 97.7°F

## 2015-02-10 DIAGNOSIS — R142 Eructation: Secondary | ICD-10-CM | POA: Diagnosis not present

## 2015-02-10 DIAGNOSIS — E782 Mixed hyperlipidemia: Secondary | ICD-10-CM

## 2015-02-10 DIAGNOSIS — G4451 Hemicrania continua: Secondary | ICD-10-CM | POA: Diagnosis not present

## 2015-02-10 DIAGNOSIS — R141 Gas pain: Secondary | ICD-10-CM

## 2015-02-10 DIAGNOSIS — R143 Flatulence: Secondary | ICD-10-CM

## 2015-02-10 DIAGNOSIS — I10 Essential (primary) hypertension: Secondary | ICD-10-CM | POA: Diagnosis not present

## 2015-02-10 DIAGNOSIS — K219 Gastro-esophageal reflux disease without esophagitis: Secondary | ICD-10-CM | POA: Diagnosis not present

## 2015-02-10 DIAGNOSIS — E785 Hyperlipidemia, unspecified: Secondary | ICD-10-CM | POA: Diagnosis not present

## 2015-02-10 NOTE — Assessment & Plan Note (Signed)
Stop Zocor to see if GERD is better Labs in 1 mo

## 2015-02-10 NOTE — Patient Instructions (Addendum)
Hold Simvastatin Take aspirin w/food

## 2015-02-10 NOTE — Assessment & Plan Note (Signed)
Go back on gluten free diet Hold Simvastatin F/u w/Dr Henrene Pastor

## 2015-02-10 NOTE — Assessment & Plan Note (Signed)
Re-start gluten free diet Hold Zocor F/u w/Dr Henrene Pastor

## 2015-02-10 NOTE — Progress Notes (Signed)
Subjective:    HPI      C/o GERD sx's x 2-4 weeks  F/u bloating - resolved on gluten free diet. He had a pelvic US. Now is having cystoscopy and kidney US F/u HTN - not taking Losartan (BP is ok), dyslipidemia, allergies  Etherine fell and broke a rib #8 on the L 1 mo ago, she had a rib X ray and a CT (Dr Noemi Chapel). Not feeling well x a few days. On Sun used Flonase and lips felt tingly, mouth was dry. It happened twice. She saw Dr Orvil Feil (Allergy)  Wt Readings from Last 3 Encounters:  12/04/14 128 lb (58.06 kg)  01/10/14 128 lb (58.06 kg)  10/10/13 124 lb (56.246 kg)   BP Readings from Last 3 Encounters:  02/10/15 108/78  01/08/15 144/92  12/04/14 122/62    Review of Systems  Constitutional: Negative for diaphoresis, activity change, appetite change, fatigue and unexpected weight change.  HENT: Positive for congestion and voice change. Negative for facial swelling, nosebleeds, sneezing and trouble swallowing.   Eyes: Negative.   Respiratory: Negative for apnea, choking, chest tightness and stridor.   Cardiovascular: Negative for palpitations and leg swelling.  Gastrointestinal: Negative.   Genitourinary: Negative.   Musculoskeletal: Negative for joint swelling, arthralgias and gait problem.  Skin: Negative for color change, pallor and wound.  Hematological: Negative for adenopathy. Does not bruise/bleed easily.  Psychiatric/Behavioral: Negative.        Objective:   Physical Exam  Constitutional: She is oriented to person, place, and time. She appears well-developed and well-nourished.  Non-toxic appearance. She does not have a sickly appearance. She does not appear ill. No distress.  HENT:  Head: Normocephalic and atraumatic.  Mouth/Throat: Oropharynx is clear and moist. No oropharyngeal exudate.  Eyes: Conjunctivae are normal. Right eye exhibits no discharge. Left eye exhibits no discharge. No scleral icterus.  Neck: Normal range of motion. Neck supple. No JVD  present. No tracheal deviation present. No thyromegaly present.  Cardiovascular: Normal rate, regular rhythm, normal heart sounds and intact distal pulses.  Exam reveals no gallop and no friction rub.   No murmur heard. Pulmonary/Chest: Effort normal and breath sounds normal. No stridor. No respiratory distress. She has no wheezes. She has no rales. She exhibits no tenderness.  Abdominal: Soft. Bowel sounds are normal. She exhibits no distension and no mass. There is no tenderness. There is no rebound and no guarding.  Musculoskeletal: Normal range of motion. She exhibits no edema or tenderness.  Lymphadenopathy:    She has no cervical adenopathy.  Neurological: She is oriented to person, place, and time.  Skin: Skin is warm and dry. No rash noted. She is not diaphoretic. No erythema. No pallor.  Psychiatric: She has a normal mood and affect. Her behavior is normal. Judgment and thought content normal.  Vitals reviewed. No rash Bruised L flank - tender  Lab Results  Component Value Date   WBC 5.4 01/03/2015   HGB 14.1 01/03/2015   HCT 41.3 01/03/2015   PLT 240.0 01/03/2015   GLUCOSE 92 01/03/2015   CHOL 197 01/03/2015   TRIG 186.0* 01/03/2015   HDL 57.10 01/03/2015   LDLCALC 103* 01/03/2015   ALT 30 01/03/2015   AST 25 01/03/2015   NA 141 01/03/2015   K 4.3 01/03/2015   CL 104 01/03/2015   CREATININE 0.79 01/03/2015   BUN 14 01/03/2015   CO2 25 01/03/2015   TSH 4.87* 01/03/2015   HGBA1C 6.0 12/05/2006  Subjective:        Assessment & Plan:           Assessment & Plan:

## 2015-02-10 NOTE — Assessment & Plan Note (Signed)
Not taking Losartan - BP is nl

## 2015-02-10 NOTE — Assessment & Plan Note (Signed)
OK to go back to Gabapentin as before

## 2015-02-10 NOTE — Progress Notes (Signed)
Pre visit review using our clinic review tool, if applicable. No additional management support is needed unless otherwise documented below in the visit note. 

## 2015-02-11 ENCOUNTER — Telehealth: Payer: Self-pay | Admitting: Internal Medicine

## 2015-02-11 NOTE — Telephone Encounter (Signed)
Discussed with pt that her appt is the 1st available with Dr. Henrene Pastor and there is nothing sooner. States she only wants to see him and will leave appt as scheduled.

## 2015-02-19 ENCOUNTER — Encounter: Payer: Self-pay | Admitting: Internal Medicine

## 2015-02-19 ENCOUNTER — Ambulatory Visit (INDEPENDENT_AMBULATORY_CARE_PROVIDER_SITE_OTHER): Payer: Medicare Other | Admitting: Internal Medicine

## 2015-02-19 VITALS — BP 110/66 | HR 88 | Ht 62.0 in | Wt 124.0 lb

## 2015-02-19 DIAGNOSIS — T63441A Toxic effect of venom of bees, accidental (unintentional), initial encounter: Secondary | ICD-10-CM | POA: Diagnosis not present

## 2015-02-19 DIAGNOSIS — R1031 Right lower quadrant pain: Secondary | ICD-10-CM | POA: Diagnosis not present

## 2015-02-19 DIAGNOSIS — K5901 Slow transit constipation: Secondary | ICD-10-CM

## 2015-02-19 DIAGNOSIS — K219 Gastro-esophageal reflux disease without esophagitis: Secondary | ICD-10-CM

## 2015-02-19 DIAGNOSIS — J4520 Mild intermittent asthma, uncomplicated: Secondary | ICD-10-CM | POA: Diagnosis not present

## 2015-02-19 DIAGNOSIS — K589 Irritable bowel syndrome without diarrhea: Secondary | ICD-10-CM

## 2015-02-19 DIAGNOSIS — J3089 Other allergic rhinitis: Secondary | ICD-10-CM | POA: Diagnosis not present

## 2015-02-19 DIAGNOSIS — J209 Acute bronchitis, unspecified: Secondary | ICD-10-CM | POA: Diagnosis not present

## 2015-02-19 MED ORDER — OMEPRAZOLE 40 MG PO CPDR: 40.0000 mg | DELAYED_RELEASE_CAPSULE | Freq: Every day | ORAL | Status: DC

## 2015-02-19 NOTE — Patient Instructions (Signed)
Please follow up with Dr. Perry as needed 

## 2015-02-19 NOTE — Progress Notes (Signed)
HISTORY OF PRESENT ILLNESS:  [name] is a 79 y.o. female followed in this office for GERD and IBS. She was last evaluated 12/04/2014. Set dictation for details. She presents today with complaints of worsening reflux disease as well as problems with right sided abdominal discomfort. For reflux she had been taking Dexilant 60 mg daily. She describes worsening pyrosis and regurgitation. She relates this to diet. No dysphagia. She also reports right-sided abdominal discomfort. She states she has had this in the past periodically. She did have a CT scan of the abdomen in February after trauma. No intra-abdominal problems. Last colonoscopy 2013 with a few diverticula and hemorrhoids. She denies weight loss or bleeding. She didn't he institute gluten-free diet as well as avoidance of caffeine and alcohol. She states that this has helped significantly. She does mention development of constipation since starting Vesicare.  REVIEW OF SYSTEMS:  All non-GI ROS negative except for sinus and allergy, arthritis, fatigue  Past Medical History  Diagnosis Date  . GERD (gastroesophageal reflux disease)   . Osteoporosis 05/2014    T score distal third radius -2.5  Spine, left and right hip all show statistically significant improvement.  . Hyperlipidemia   . OA (osteoarthritis)   . Allergic rhinitis   . Baker's cyst     Left-Dr. Aluisio  . Torn meniscus     bilateral  . MVP (mitral valve prolapse)     Antibiotics required for dental procedures  . Cataract     Dr. Katy Fitch  . Overactive bladder   . Atrophic vaginitis   . Fibroid   . Thyroid nodule   . Heart murmur   . Thyroid disease   . IBS (irritable bowel syndrome)   . Colon polyps   . Gastritis     chronic  . Hemorrhoids     Past Surgical History  Procedure Laterality Date  . Cataract extraction, bilateral    . Tonsillectomy      Social History [employer]  reports that she has quit smoking. She has never used smokeless tobacco. She  reports that she drinks about 3.0 oz of alcohol per week. She reports that she does not use illicit drugs.  family history includes Congestive Heart Failure in her mother; Heart attack in her brother; Heart disease in her mother; Hypertension in her mother; Osteoporosis in her mother; Pancreatic cancer in her father. There is no history of Colon cancer, Colon polyps, Rectal cancer, or Stomach cancer.  Allergies  Allergen Reactions  . Bee Venom Itching, Swelling and Rash    Itching, swelling and rash with bee stings, patient has epi pen  . Neosporin [Neomycin-Bacitracin Zn-Polymyx]        PHYSICAL EXAMINATION: Vital signs: BP 110/66 mmHg  Pulse 88  Ht 5\' 2"  (1.575 m)  Wt 124 lb (56.246 kg)  BMI 22.67 kg/m2 General: Well-developed, well-nourished, no acute distress HEENT: Sclerae are anicteric, conjunctiva pink. Oral mucosa intact Lungs: Clear Heart: Regular Abdomen: soft, mild right mid abdominal discomfort with deep palpation, nondistended, no obvious ascites, no peritoneal signs, normal bowel sounds. No organomegaly. Extremities: No edema Psychiatric: alert and oriented x3. Cooperative   ASSESSMENT:  #1. GERD. Recent exacerbation of symptoms despite PPI compliance #2. Right-sided abdominal discomfort likely related to IBS  #3. Constipation. Likely secondary to Vesicare #4. Colonoscopy 2013 with minimal findings   PLAN:  #1. Reinstitution of gluten-free diet and reflux precautions as this has helped her previously #2. Recommend Prilosec OTC for 5-7 days in the evening if  break through reflux were to recur #3. Increase water for constipation #4. GI follow-up in 1-2 years. Sooner if needed

## 2015-02-20 DIAGNOSIS — H5000 Unspecified esotropia: Secondary | ICD-10-CM | POA: Diagnosis not present

## 2015-02-20 DIAGNOSIS — H524 Presbyopia: Secondary | ICD-10-CM | POA: Diagnosis not present

## 2015-02-20 DIAGNOSIS — Z961 Presence of intraocular lens: Secondary | ICD-10-CM | POA: Diagnosis not present

## 2015-03-07 ENCOUNTER — Other Ambulatory Visit (INDEPENDENT_AMBULATORY_CARE_PROVIDER_SITE_OTHER): Payer: Medicare Other

## 2015-03-07 ENCOUNTER — Other Ambulatory Visit (HOSPITAL_COMMUNITY): Payer: Self-pay | Admitting: Cardiology

## 2015-03-07 DIAGNOSIS — E785 Hyperlipidemia, unspecified: Secondary | ICD-10-CM | POA: Diagnosis not present

## 2015-03-07 DIAGNOSIS — I10 Essential (primary) hypertension: Secondary | ICD-10-CM | POA: Diagnosis not present

## 2015-03-07 DIAGNOSIS — I5022 Chronic systolic (congestive) heart failure: Secondary | ICD-10-CM

## 2015-03-07 LAB — LIPID PANEL
Cholesterol: 240 mg/dL — ABNORMAL HIGH (ref 0–200)
HDL: 61 mg/dL (ref >39.00)
LDL Cholesterol: 146 mg/dL — ABNORMAL HIGH (ref 0–99)
NonHDL: 179
Total CHOL/HDL Ratio: 4
Triglycerides: 164 mg/dL — ABNORMAL HIGH (ref 0.0–149.0)
VLDL: 32.8 mg/dL (ref 0.0–40.0)

## 2015-03-07 LAB — HEPATIC FUNCTION PANEL
ALT: 32 U/L (ref 0–35)
AST: 21 U/L (ref 0–37)
Albumin: 4.1 g/dL (ref 3.5–5.2)
Alkaline Phosphatase: 54 U/L (ref 39–117)
Bilirubin, Direct: 0.1 mg/dL (ref 0.0–0.3)
Total Bilirubin: 0.5 mg/dL (ref 0.2–1.2)
Total Protein: 7.6 g/dL (ref 6.0–8.3)

## 2015-03-07 LAB — BASIC METABOLIC PANEL
BUN: 17 mg/dL (ref 6–23)
CO2: 26 mEq/L (ref 19–32)
Calcium: 9.4 mg/dL (ref 8.4–10.5)
Chloride: 104 mEq/L (ref 96–112)
Creatinine, Ser: 1.17 mg/dL (ref 0.40–1.20)
GFR: 47.71 mL/min — ABNORMAL LOW (ref >60.00)
Glucose, Bld: 89 mg/dL (ref 70–99)
Potassium: 4.2 mEq/L (ref 3.5–5.1)
Sodium: 136 mEq/L (ref 135–145)

## 2015-03-12 ENCOUNTER — Encounter (HOSPITAL_COMMUNITY): Payer: Self-pay

## 2015-03-12 ENCOUNTER — Ambulatory Visit (HOSPITAL_COMMUNITY)
Admission: RE | Admit: 2015-03-12 | Discharge: 2015-03-12 | Disposition: A | Discharge status: Home / Self Care | Payer: Medicare Other | Source: Ambulatory Visit | Attending: Internal Medicine | Admitting: Internal Medicine

## 2015-03-12 ENCOUNTER — Ambulatory Visit (HOSPITAL_BASED_OUTPATIENT_CLINIC_OR_DEPARTMENT_OTHER)
Admission: RE | Admit: 2015-03-12 | Discharge: 2015-03-12 | Disposition: A | Discharge status: Home / Self Care | Payer: Medicare Other | Source: Ambulatory Visit | Attending: Internal Medicine | Admitting: Internal Medicine

## 2015-03-12 VITALS — BP 130/78 | HR 79

## 2015-03-12 DIAGNOSIS — E782 Mixed hyperlipidemia: Secondary | ICD-10-CM | POA: Diagnosis not present

## 2015-03-12 DIAGNOSIS — I358 Other nonrheumatic aortic valve disorders: Secondary | ICD-10-CM

## 2015-03-12 DIAGNOSIS — I341 Nonrheumatic mitral (valve) prolapse: Secondary | ICD-10-CM | POA: Diagnosis not present

## 2015-03-12 DIAGNOSIS — I5022 Chronic systolic (congestive) heart failure: Secondary | ICD-10-CM | POA: Insufficient documentation

## 2015-03-12 DIAGNOSIS — I509 Heart failure, unspecified: Secondary | ICD-10-CM | POA: Diagnosis not present

## 2015-03-12 HISTORY — DX: Other nonrheumatic aortic valve disorders: I35.8

## 2015-03-12 NOTE — Patient Instructions (Signed)
Have fasting lipid panel drawn at [facility] next month. Address: [address], [place], [place] [address]  [phone]  Follow up 1 year.

## 2015-03-12 NOTE — Progress Notes (Signed)
Patient ID: [name], female   DOB: 1936-08-11, 79 y.o.   MRN: [id number]  HPI:  [name] is a 79 year old woman with a history of hyperlipidemia, mild mitral valve prolapse with mild mitral regurgitation (echo 12/11).  She has undergone CT angiogram at the [facility] about 10 years ago which was totally normal.   She returns today for routine followup.  Still exercising with trainer. Doing Pilates. No CP, SOB or edema. BP well controlled. Was having stomach problems so simva stopped. Cholesterol levels came back and was up and simva restarted.   Echo today (I reviewed personally) EF 60% mildly calcified AoV. Without AS. Trivial MVP trivial MR  Lab Results  Component Value Date   CHOL 240* 03/07/2015   HDL 61.00 03/07/2015   LDLCALC 146* 03/07/2015   TRIG 164.0* 03/07/2015   CHOLHDL 4 03/07/2015    ROS: All systems negative except as listed in HPI, PMH and Problem List.  Past Medical History  Diagnosis Date  . GERD (gastroesophageal reflux disease)   . Osteoporosis 05/2014    T score distal third radius -2.5  Spine, left and right hip all show statistically significant improvement.  . Hyperlipidemia   . OA (osteoarthritis)   . Allergic rhinitis   . Baker's cyst     Left-Dr. Aluisio  . Torn meniscus     bilateral  . MVP (mitral valve prolapse)     Antibiotics required for dental procedures  . Cataract     Dr. Dione Booze  . Overactive bladder   . Atrophic vaginitis   . Fibroid   . Thyroid nodule   . Heart murmur   . Thyroid disease   . IBS (irritable bowel syndrome)   . Colon polyps   . Gastritis     chronic  . Hemorrhoids     Current Outpatient Prescriptions  Medication Sig Dispense Refill  . aspirin 81 MG tablet Take 81 mg by mouth daily.      . Calcium Carbonate-Vitamin D (CALCIUM-VITAMIN D) 500-200 MG-UNIT per tablet Take 3 tablets by mouth daily.     . Cholecalciferol (VITAMIN D) 400 UNITS capsule Take 400 Units by mouth daily.    [name] desloratadine (CLARINEX) 5 MG  tablet Take 1 tablet (5 mg total) by mouth daily. (Patient taking differently: Take 5 mg by mouth as needed. PRN ONLY) 1 tablet 11  . dexlansoprazole (DEXILANT) 60 MG capsule Take 1 capsule (60 mg total) by mouth daily. 30 capsule 11  . EPIPEN 2-PAK 0.3 MG/0.3ML SOAJ injection Inject 0.3 mg as directed as needed.     [name] estradiol (ESTRACE VAGINAL) 0.1 MG/GM vaginal cream Place 1 Applicatorful vaginally 3 (three) times a week. 42.5 g 3  . fluticasone (FLONASE) 50 MCG/ACT nasal spray USE 2 SPRAYS IN EACH NOSTRIL ONCE DAILY 16 g 5  . gabapentin (NEURONTIN) 300 MG capsule Take 1 capsule (300 mg total) by mouth 3 (three) times daily. (Patient taking differently: Take 300 mg by mouth as needed. ) 90 capsule 11  . ipratropium (ATROVENT) 0.06 % nasal spray Place 1 spray into the nose 3 (three) times daily as needed for rhinitis. 15 mL 0  . LORazepam (ATIVAN) 1 MG tablet TAKE 1 OR 2 TABLETS TWICE A DAY AS NEEDED FOR ANXIETY. (Patient taking differently: TAKE 1 OR 2 TABLETS TWICE A DAY daily) 100 tablet 3  . montelukast (SINGULAIR) 10 MG tablet Take 10 mg by mouth at bedtime.     [name] omeprazole (PRILOSEC) 40 MG capsule Take 1  capsule (40 mg total) by mouth daily. 30 capsule 3  . simvastatin (ZOCOR) 40 MG tablet TAKE 1 TABLET ONCE DAILY. 90 tablet 1  . solifenacin (VESICARE) 5 MG tablet Take 5 mg by mouth daily.    [name] SYNTHROID 50 MCG tablet TAKE 1 TABLET ONCE DAILY BEFORE BREAKFAST. 30 tablet 5  . albuterol (PROVENTIL HFA;VENTOLIN HFA) 108 (90 BASE) MCG/ACT inhaler Inhale 2 puffs into the lungs every 6 (six) hours as needed for wheezing. (Patient not taking: Reported on 03/12/2015) 1 Inhaler 3  . budesonide-formoterol (SYMBICORT) 80-4.5 MCG/ACT inhaler Inhale 2 puffs into the lungs 2 (two) times daily. (Patient not taking: Reported on 03/12/2015) 1 Inhaler 5  . losartan (COZAAR) 50 MG tablet Take 1 tablet (50 mg total) by mouth daily. (Patient not taking: Reported on 03/12/2015) 30 tablet 11  . SALINE NASAL SPRAY NA  Place into the nose.     Current Facility-Administered Medications  Medication Dose Route Frequency Provider Last Rate Last Dose  . triamcinolone acetonide (KENALOG) 10 MG/ML injection 10 mg  10 mg Other Once Lenn Sink, [name]         PHYSICAL EXAM: Filed Vitals:   03/12/15 0957  BP: 130/78  Pulse: 79   General:  Well appearing. No resp difficulty HEENT: normal Neck: supple. JVP flat. Carotids 2+ bilaterally; no bruits. No lymphadenopathy or thryomegaly appreciated. Cor: PMI normal. Regular rate & rhythm. No rubs, gallops. Minimal AS murmur. S2 crisp Lungs: clear Abdomen: soft, nontender, nondistended. No hepatosplenomegaly. No bruits or masses. Good bowel sounds. Extremities: no cyanosis, clubbing, rash, edema Neuro: alert & orientedx3, cranial nerves grossly intact. Moves all 4 extremities w/o difficulty. Affect pleasant.   ASSESSMENT & PLAN: 1. Aortic valve thickening  2. MVP, mild 3. Hyperlipidemia  Echo reviewed personally and discussed. Aortic valve is stable. No significant AS. MVP is very mild with trivial MR. Will continue to follow with yearly echos. Continue exercise. Lipids are high. Simva restarted. Would recheck lipids in a month. May need higher potency statin.   Darlys Buis,MD 10:12 AM

## 2015-03-12 NOTE — Addendum Note (Signed)
Encounter addended by: Effie Berkshire, RN on: 03/12/2015 10:38 AM<BR>     Documentation filed: Dx Association, Patient Instructions Section, Orders

## 2015-03-12 NOTE — Progress Notes (Signed)
Echocardiogram 2D Echocardiogram has been performed.  [name] 03/12/2015, 9:57 AM

## 2015-03-14 ENCOUNTER — Other Ambulatory Visit: Payer: Self-pay | Admitting: Internal Medicine

## 2015-03-17 NOTE — Telephone Encounter (Signed)
Called [facility] spoke with angela gave md approval.../lmb

## 2015-03-24 ENCOUNTER — Ambulatory Visit (INDEPENDENT_AMBULATORY_CARE_PROVIDER_SITE_OTHER): Payer: Medicare Other | Admitting: Gynecology

## 2015-03-24 ENCOUNTER — Other Ambulatory Visit (HOSPITAL_COMMUNITY)
Admission: RE | Admit: 2015-03-24 | Discharge: 2015-03-24 | Disposition: A | Discharge status: Home / Self Care | Payer: Medicare Other | Source: Ambulatory Visit | Attending: Gynecology | Admitting: Gynecology

## 2015-03-24 ENCOUNTER — Encounter: Payer: Self-pay | Admitting: Gynecology

## 2015-03-24 VITALS — BP 120/76 | Ht 61.0 in

## 2015-03-24 DIAGNOSIS — M810 Age-related osteoporosis without current pathological fracture: Secondary | ICD-10-CM | POA: Diagnosis not present

## 2015-03-24 DIAGNOSIS — N952 Postmenopausal atrophic vaginitis: Secondary | ICD-10-CM | POA: Diagnosis not present

## 2015-03-24 DIAGNOSIS — R9230 Dense breasts, unspecified: Secondary | ICD-10-CM

## 2015-03-24 DIAGNOSIS — Z124 Encounter for screening for malignant neoplasm of cervix: Secondary | ICD-10-CM | POA: Diagnosis not present

## 2015-03-24 DIAGNOSIS — R922 Inconclusive mammogram: Secondary | ICD-10-CM | POA: Diagnosis not present

## 2015-03-24 DIAGNOSIS — Z01419 Encounter for gynecological examination (general) (routine) without abnormal findings: Secondary | ICD-10-CM | POA: Diagnosis not present

## 2015-03-24 NOTE — Progress Notes (Signed)
[name] 06/30/1936 [id number]        78 y.o.  [id number] for breast and pelvic exam. Several issues noted below.  Past medical history,surgical history, problem list, medications, allergies, family history and social history were all reviewed and documented as reviewed in the EPIC chart.  ROS:  Performed with pertinent positives and negatives included in the history, assessment and plan.   Additional significant findings :  none   Exam: Kim Counsellor Vitals:   03/24/15 1017  BP: 120/76  Height: 5\' 1"  (1.549 m)   General appearance:  Normal affect, orientation and appearance. Skin: Grossly normal HEENT: Without gross lesions.  No cervical or supraclavicular adenopathy. Thyroid normal.  Lungs:  Clear without wheezing, rales or rhonchi Cardiac: RR, without RMG Abdominal:  Soft, nontender, without masses, guarding, rebound, organomegaly or hernia Breasts:  Examined lying and sitting without masses, retractions, discharge or axillary adenopathy. Pelvic:  Ext/BUS/vagina with generalized atrophic changes  Cervix with atrophic changes. Pap smear done  Uterus anteverted, normal size, shape and contour, midline and mobile nontender   Adnexa  Without masses or tenderness    Anus and perineum  Normal   Rectovaginal  Normal sphincter tone without palpated masses or tenderness.    Assessment/Plan:  79 y.o. [id number] female for breast and pelvic exam..   1. Postmenopausal/atrophic genital changes. Patient without significant symptoms of hot flashes, vaginal dryness, night sweats. No vaginal bleeding. Continue to monitor and report any vaginal bleeding. 2. Osteoporosis.  DEXA 05/2014 distal third radius -2.5. Spine, left and right hips with statistically significant improvement from last bone density. Had been on Fosamax for years with discontinuation 7 years ago.  Per our prior discussion 07/2014 she elected not to be treated but to monitor and repeat her bone density a two-year interval.  Increased calcium vitamin D reviewed. Recheck bone density next year. 3. Leiomyoma. Ultrasound 2012 showed several small myomas largest 26 mm. Exam shows normal uterine size. Will monitor annually with physical exams. 4. Mammography 06/2014. Continue with annual mammography. Patient does have dense breasts. She prefers biannual breast exams and will follow up in 6 months for exam. 5. Pap smear 2013. Pap smear done today. No history of significant abnormal Pap smears. Patient uncomfortable with stop screening recommendations based on age and she prefers to be screened. 6. Colonoscopy 2013. Repeat at their recommended interval. 7. Health maintenance. No routine lab work done as she has this done at her primary physician's office. Follow up 6 months for breast exam otherwise annual exam, sooner as needed.     Anastasio Auerbach MD, 10:56 AM 03/24/2015

## 2015-03-24 NOTE — Addendum Note (Signed)
Addended by: Nelva Nay on: 03/24/2015 12:22 PM   Modules accepted: Orders

## 2015-03-24 NOTE — Patient Instructions (Signed)
Follow up in 6 months for breast reexamination.  You may obtain a copy of any labs that were done today by logging onto MyChart as outlined in the instructions provided with your AVS (after visit summary). The office will not call with normal lab results but certainly if there are any significant abnormalities then we will contact you.   Health Maintenance, Female A healthy lifestyle and preventative care can promote health and wellness.  Maintain regular health, dental, and eye exams.  Eat a healthy diet. Foods like vegetables, fruits, whole grains, low-fat dairy products, and lean protein foods contain the nutrients you need without too many calories. Decrease your intake of foods high in solid fats, added sugars, and salt. Get information about a proper diet from your caregiver, if necessary.  Regular physical exercise is one of the most important things you can do for your health. Most adults should get at least 150 minutes of moderate-intensity exercise (any activity that increases your heart rate and causes you to sweat) each week. In addition, most adults need muscle-strengthening exercises on 2 or more days a week.   Maintain a healthy weight. The body mass index (BMI) is a screening tool to identify possible weight problems. It provides an estimate of body fat based on height and weight. Your caregiver can help determine your BMI, and can help you achieve or maintain a healthy weight. For adults 20 years and older:  A BMI below 18.5 is considered underweight.  A BMI of 18.5 to 24.9 is normal.  A BMI of 25 to 29.9 is considered overweight.  A BMI of 30 and above is considered obese.  Maintain normal blood lipids and cholesterol by exercising and minimizing your intake of saturated fat. Eat a balanced diet with plenty of fruits and vegetables. Blood tests for lipids and cholesterol should begin at age 36 and be repeated every 5 years. If your lipid or cholesterol levels are high, you  are over 50, or you are a high risk for heart disease, you may need your cholesterol levels checked more frequently.Ongoing high lipid and cholesterol levels should be treated with medicines if diet and exercise are not effective.  If you smoke, find out from your caregiver how to quit. If you do not use tobacco, do not start.  Lung cancer screening is recommended for adults aged 45 80 years who are at high risk for developing lung cancer because of a history of smoking. Yearly low-dose computed tomography (CT) is recommended for people who have at least a 30-pack-year history of smoking and are a current smoker or have quit within the past 15 years. A pack year of smoking is smoking an average of 1 pack of cigarettes a day for 1 year (for example: 1 pack a day for 30 years or 2 packs a day for 15 years). Yearly screening should continue until the smoker has stopped smoking for at least 15 years. Yearly screening should also be stopped for people who develop a health problem that would prevent them from having lung cancer treatment.  If you are pregnant, do not drink alcohol. If you are breastfeeding, be very cautious about drinking alcohol. If you are not pregnant and choose to drink alcohol, do not exceed 1 drink per day. One drink is considered to be 12 ounces (355 mL) of beer, 5 ounces (148 mL) of wine, or 1.5 ounces (44 mL) of liquor.  Avoid use of street drugs. Do not share needles with anyone. Ask for  help if you need support or instructions about stopping the use of drugs.  High blood pressure causes heart disease and increases the risk of stroke. Blood pressure should be checked at least every 1 to 2 years. Ongoing high blood pressure should be treated with medicines, if weight loss and exercise are not effective.  If you are 94 to 77 years old, ask your caregiver if you should take aspirin to prevent strokes.  Diabetes screening involves taking a blood sample to check your fasting blood  sugar level. This should be done once every 3 years, after age 56, if you are within normal weight and without risk factors for diabetes. Testing should be considered at a younger age or be carried out more frequently if you are overweight and have at least 1 risk factor for diabetes.  Breast cancer screening is essential preventative care for women. You should practice "breast self-awareness." This means understanding the normal appearance and feel of your breasts and may include breast self-examination. Any changes detected, no matter how small, should be reported to a caregiver. Women in their 40s and 30s should have a clinical breast exam (CBE) by a caregiver as part of a regular health exam every 1 to 3 years. After age 59, women should have a CBE every year. Starting at age 18, women should consider having a mammogram (breast X-ray) every year. Women who have a family history of breast cancer should talk to their caregiver about genetic screening. Women at a high risk of breast cancer should talk to their caregiver about having an MRI and a mammogram every year.  Breast cancer gene (BRCA)-related cancer risk assessment is recommended for women who have family members with BRCA-related cancers. BRCA-related cancers include breast, ovarian, tubal, and peritoneal cancers. Having family members with these cancers may be associated with an increased risk for harmful changes (mutations) in the breast cancer genes BRCA1 and BRCA2. Results of the assessment will determine the need for genetic counseling and BRCA1 and BRCA2 testing.  The Pap test is a screening test for cervical cancer. Women should have a Pap test starting at age 33. Between ages 59 and 29, Pap tests should be repeated every 2 years. Beginning at age 11, you should have a Pap test every 3 years as long as the past 3 Pap tests have been normal. If you had a hysterectomy for a problem that was not cancer or a condition that could lead to cancer,  then you no longer need Pap tests. If you are between ages 67 and 28, and you have had normal Pap tests going back 10 years, you no longer need Pap tests. If you have had past treatment for cervical cancer or a condition that could lead to cancer, you need Pap tests and screening for cancer for at least 20 years after your treatment. If Pap tests have been discontinued, risk factors (such as a new sexual partner) need to be reassessed to determine if screening should be resumed. Some women have medical problems that increase the chance of getting cervical cancer. In these cases, your caregiver may recommend more frequent screening and Pap tests.  The human papillomavirus (HPV) test is an additional test that may be used for cervical cancer screening. The HPV test looks for the virus that can cause the cell changes on the cervix. The cells collected during the Pap test can be tested for HPV. The HPV test could be used to screen women aged 17 years and older,  and should be used in women of any age who have unclear Pap test results. After the age of 29, women should have HPV testing at the same frequency as a Pap test.  Colorectal cancer can be detected and often prevented. Most routine colorectal cancer screening begins at the age of 55 and continues through age 11. However, your caregiver may recommend screening at an earlier age if you have risk factors for colon cancer. On a yearly basis, your caregiver may provide home test kits to check for hidden blood in the stool. Use of a small camera at the end of a tube, to directly examine the colon (sigmoidoscopy or colonoscopy), can detect the earliest forms of colorectal cancer. Talk to your caregiver about this at age 74, when routine screening begins. Direct examination of the colon should be repeated every 5 to 10 years through age 55, unless early forms of pre-cancerous polyps or small growths are found.  Hepatitis C blood testing is recommended for all people  born from [birth year] through 1965 and any individual with known risks for hepatitis C.  Practice safe sex. Use condoms and avoid high-risk sexual practices to reduce the spread of sexually transmitted infections (STIs). Sexually active women aged 56 and younger should be checked for Chlamydia, which is a common sexually transmitted infection. Older women with new or multiple partners should also be tested for Chlamydia. Testing for other STIs is recommended if you are sexually active and at increased risk.  Osteoporosis is a disease in which the bones lose minerals and strength with aging. This can result in serious bone fractures. The risk of osteoporosis can be identified using a bone density scan. Women ages 96 and over and women at risk for fractures or osteoporosis should discuss screening with their caregivers. Ask your caregiver whether you should be taking a calcium supplement or vitamin D to reduce the rate of osteoporosis.  Menopause can be associated with physical symptoms and risks. Hormone replacement therapy is available to decrease symptoms and risks. You should talk to your caregiver about whether hormone replacement therapy is right for you.  Use sunscreen. Apply sunscreen liberally and repeatedly throughout the day. You should seek shade when your shadow is shorter than you. Protect yourself by wearing long sleeves, pants, a wide-brimmed hat, and sunglasses year round, whenever you are outdoors.  Notify your caregiver of new moles or changes in moles, especially if there is a change in shape or color. Also notify your caregiver if a mole is larger than the size of a pencil eraser.  Stay current with your immunizations. Document Released: 05/31/2011 Document Revised: 03/12/2013 Document Reviewed: 05/31/2011 [facility] Patient Information 2014 [place].

## 2015-03-26 LAB — CYTOLOGY - PAP

## 2015-04-03 ENCOUNTER — Ambulatory Visit (INDEPENDENT_AMBULATORY_CARE_PROVIDER_SITE_OTHER): Payer: Medicare Other | Admitting: Podiatry

## 2015-04-03 DIAGNOSIS — L84 Corns and callosities: Secondary | ICD-10-CM | POA: Diagnosis not present

## 2015-04-03 NOTE — Progress Notes (Signed)
Subjective:     Patient ID: [name], female   DOB: [birth year], 79 y.o.   MRN: [id number]  HPI patient presents with pain between the fourth and fifth toes on the left foot make it hard to walk comfortably   Review of Systems     Objective:   Physical Exam Harlow Asa status intact with lesions on the fourth fifth and second toe left    Assessment:     Keratotic lesions    Plan:     Debride lesions on both feet with no iatrogenic bleeding noted

## 2015-04-10 ENCOUNTER — Other Ambulatory Visit (INDEPENDENT_AMBULATORY_CARE_PROVIDER_SITE_OTHER): Payer: Medicare Other

## 2015-04-10 DIAGNOSIS — E786 Lipoprotein deficiency: Secondary | ICD-10-CM | POA: Diagnosis not present

## 2015-04-10 DIAGNOSIS — E782 Mixed hyperlipidemia: Secondary | ICD-10-CM

## 2015-04-10 DIAGNOSIS — R7989 Other specified abnormal findings of blood chemistry: Secondary | ICD-10-CM

## 2015-04-10 LAB — LIPID PANEL
Cholesterol: 178 mg/dL (ref 0–200)
HDL: 49.3 mg/dL (ref >39.00)
LDL Cholesterol: 91 mg/dL (ref 0–99)
NonHDL: 128.7
Total CHOL/HDL Ratio: 4
Triglycerides: 189 mg/dL — ABNORMAL HIGH (ref 0.0–149.0)
VLDL: 37.8 mg/dL (ref 0.0–40.0)

## 2015-04-22 ENCOUNTER — Telehealth (HOSPITAL_COMMUNITY): Payer: Self-pay | Admitting: *Deleted

## 2015-04-22 MED ORDER — CVS FISH OIL 1000 MG PO CAPS: 1.0000 | ORAL_CAPSULE | Freq: Every day | ORAL | Status: DC

## 2015-04-22 NOTE — Telephone Encounter (Signed)
Pt aware and agreeable.  

## 2015-04-22 NOTE — Telephone Encounter (Signed)
-----   Message from Jolaine Artist, MD sent at 04/11/2015 11:12 PM EDT ----- Ok. TGs slightly elevated. Can take fish oil 1g per day.

## 2015-04-29 DIAGNOSIS — M75101 Unspecified rotator cuff tear or rupture of right shoulder, not specified as traumatic: Secondary | ICD-10-CM | POA: Diagnosis not present

## 2015-05-14 ENCOUNTER — Other Ambulatory Visit: Payer: Self-pay | Admitting: Internal Medicine

## 2015-06-23 DIAGNOSIS — D225 Melanocytic nevi of trunk: Secondary | ICD-10-CM | POA: Diagnosis not present

## 2015-06-23 DIAGNOSIS — Z85828 Personal history of other malignant neoplasm of skin: Secondary | ICD-10-CM | POA: Diagnosis not present

## 2015-06-23 DIAGNOSIS — L539 Erythematous condition, unspecified: Secondary | ICD-10-CM | POA: Diagnosis not present

## 2015-06-23 DIAGNOSIS — L821 Other seborrheic keratosis: Secondary | ICD-10-CM | POA: Diagnosis not present

## 2015-06-23 DIAGNOSIS — L719 Rosacea, unspecified: Secondary | ICD-10-CM | POA: Diagnosis not present

## 2015-06-26 ENCOUNTER — Other Ambulatory Visit: Payer: Self-pay

## 2015-06-26 DIAGNOSIS — Z1231 Encounter for screening mammogram for malignant neoplasm of breast: Secondary | ICD-10-CM

## 2015-07-14 ENCOUNTER — Other Ambulatory Visit: Payer: Self-pay | Admitting: Internal Medicine

## 2015-07-29 ENCOUNTER — Other Ambulatory Visit (INDEPENDENT_AMBULATORY_CARE_PROVIDER_SITE_OTHER): Payer: Medicare Other

## 2015-07-29 ENCOUNTER — Telehealth: Payer: Self-pay

## 2015-07-29 DIAGNOSIS — R5383 Other fatigue: Secondary | ICD-10-CM

## 2015-07-29 DIAGNOSIS — E041 Nontoxic single thyroid nodule: Secondary | ICD-10-CM

## 2015-07-29 LAB — T4, FREE: Free T4: 1.06 ng/dL (ref 0.60–1.60)

## 2015-07-29 LAB — T3, FREE: T3, Free: 2.9 pg/mL (ref 2.3–4.2)

## 2015-07-29 LAB — TSH: TSH: 3.26 u[IU]/mL (ref 0.35–4.50)

## 2015-07-29 NOTE — Telephone Encounter (Signed)
Patient called and wanted labs

## 2015-07-30 ENCOUNTER — Ambulatory Visit (INDEPENDENT_AMBULATORY_CARE_PROVIDER_SITE_OTHER): Payer: Medicare Other | Admitting: Internal Medicine

## 2015-07-30 ENCOUNTER — Encounter: Payer: Self-pay | Admitting: Internal Medicine

## 2015-07-30 VITALS — BP 128/80 | HR 76

## 2015-07-30 DIAGNOSIS — H524 Presbyopia: Secondary | ICD-10-CM | POA: Diagnosis not present

## 2015-07-30 DIAGNOSIS — H538 Other visual disturbances: Secondary | ICD-10-CM | POA: Diagnosis not present

## 2015-07-30 DIAGNOSIS — E042 Nontoxic multinodular goiter: Secondary | ICD-10-CM | POA: Diagnosis not present

## 2015-07-30 DIAGNOSIS — E034 Atrophy of thyroid (acquired): Secondary | ICD-10-CM

## 2015-07-30 DIAGNOSIS — Z23 Encounter for immunization: Secondary | ICD-10-CM

## 2015-07-30 DIAGNOSIS — E038 Other specified hypothyroidism: Secondary | ICD-10-CM

## 2015-07-30 DIAGNOSIS — R5383 Other fatigue: Secondary | ICD-10-CM

## 2015-07-30 DIAGNOSIS — Z961 Presence of intraocular lens: Secondary | ICD-10-CM | POA: Diagnosis not present

## 2015-07-30 DIAGNOSIS — I10 Essential (primary) hypertension: Secondary | ICD-10-CM

## 2015-07-30 NOTE — Assessment & Plan Note (Signed)
US ordered

## 2015-07-30 NOTE — Assessment & Plan Note (Signed)
Doing fair 

## 2015-07-30 NOTE — Progress Notes (Signed)
Subjective:  Patient ID: [name], female    DOB: [birth year]  Age: 79 y.o. MRN: [id number]  CC: No chief complaint on file.   HPI Labrea Eccleston presents for HTN, asthma,   Outpatient Prescriptions Prior to Visit  Medication Sig Dispense Refill  . albuterol (PROVENTIL HFA;VENTOLIN HFA) 108 (90 BASE) MCG/ACT inhaler Inhale 2 puffs into the lungs every 6 (six) hours as needed for wheezing. 1 Inhaler 3  . aspirin 81 MG tablet Take 81 mg by mouth daily.      . budesonide-formoterol (SYMBICORT) 80-4.5 MCG/ACT inhaler Inhale 2 puffs into the lungs 2 (two) times daily. 1 Inhaler 5  . Calcium Carbonate-Vitamin D (CALCIUM-VITAMIN D) 500-200 MG-UNIT per tablet Take 3 tablets by mouth daily.     . Cholecalciferol (VITAMIN D) 400 UNITS capsule Take 400 Units by mouth daily.    [name] dexlansoprazole (DEXILANT) 60 MG capsule Take 1 capsule (60 mg total) by mouth daily. 30 capsule 11  . EPIPEN 2-PAK 0.3 MG/0.3ML SOAJ injection Inject 0.3 mg as directed as needed.     [name] estradiol (ESTRACE VAGINAL) 0.1 MG/GM vaginal cream Place 1 Applicatorful vaginally 3 (three) times a week. 42.5 g 3  . fluticasone (FLONASE) 50 MCG/ACT nasal spray Place 2 sprays into both nostrils daily. 16 g 3  . gabapentin (NEURONTIN) 300 MG capsule Take 1 capsule (300 mg total) by mouth 3 (three) times daily. (Patient taking differently: Take 300 mg by mouth as needed. ) 90 capsule 11  . ipratropium (ATROVENT) 0.06 % nasal spray Place 1 spray into the nose 3 (three) times daily as needed for rhinitis. 15 mL 0  . LORazepam (ATIVAN) 1 MG tablet TAKE 1 OR 2 TABLETS TWICE A DAY AS NEEDED FOR ANXIETY. 100 tablet 3  . montelukast (SINGULAIR) 10 MG tablet Take 10 mg by mouth at bedtime.     . Omega-3 Fatty Acids ([employer] FISH OIL) 1000 MG CAPS Take 1 capsule by mouth daily. 90 capsule   . omeprazole (PRILOSEC) 40 MG capsule Take 1 capsule (40 mg total) by mouth daily. 30 capsule 3  . predniSONE (DELTASONE) 10 MG tablet As needed    . SALINE NASAL  SPRAY NA Place into the nose.    . simvastatin (ZOCOR) 40 MG tablet TAKE 1 TABLET ONCE DAILY. 90 tablet 1  . SYNTHROID 50 MCG tablet TAKE 1 TABLET ONCE DAILY BEFORE BREAKFAST. 30 tablet 11  . azithromycin (ZITHROMAX) 250 MG tablet As needed    . solifenacin (VESICARE) 5 MG tablet Take 5 mg by mouth daily.    [name] amoxicillin (AMOXIL) 500 MG capsule As needed for dental work    . losartan (COZAAR) 50 MG tablet Take 1 tablet (50 mg total) by mouth daily. (Patient not taking: Reported on 07/30/2015) 30 tablet 11  . desloratadine (CLARINEX) 5 MG tablet Take 1 tablet (5 mg total) by mouth daily. (Patient not taking: Reported on 07/30/2015) 1 tablet 11   Facility-Administered Medications Prior to Visit  Medication Dose Route Frequency Provider Last Rate Last Dose  . triamcinolone acetonide (KENALOG) 10 MG/ML injection 10 mg  10 mg Other Once [name], [name]        ROS Review of Systems  Objective:  BP 128/80 mmHg  Pulse 76  Wt   SpO2 97%  BP Readings from Last 3 Encounters:  07/30/15 128/80  03/24/15 120/76  03/12/15 130/78    Wt Readings from Last 3 Encounters:  02/19/15 124 lb (56.246 kg)  12/04/14 128  lb (58.06 kg)  01/10/14 128 lb (58.06 kg)    Physical Exam  Lab Results  Component Value Date   WBC 5.4 01/03/2015   HGB 14.1 01/03/2015   HCT 41.3 01/03/2015   PLT 240.0 01/03/2015   GLUCOSE 89 03/07/2015   CHOL 178 04/10/2015   TRIG 189.0* 04/10/2015   HDL 49.30 04/10/2015   LDLCALC 91 04/10/2015   ALT 32 03/07/2015   AST 21 03/07/2015   NA 136 03/07/2015   K 4.2 03/07/2015   CL 104 03/07/2015   CREATININE 1.17 03/07/2015   BUN 17 03/07/2015   CO2 26 03/07/2015   TSH 3.26 07/29/2015   HGBA1C 6.0 12/05/2006    No results found.  Assessment & Plan:   There are no diagnoses linked to this encounter. I have discontinued Ms. Miles's desloratadine, solifenacin, and azithromycin. I am also having her maintain her aspirin, calcium-vitamin D, SALINE NASAL SPRAY NA,  Vitamin D, budesonide-formoterol, albuterol, gabapentin, estradiol, ipratropium, EPIPEN 2-PAK, dexlansoprazole, simvastatin, montelukast, losartan, omeprazole, LORazepam, amoxicillin, predniSONE, [employer] FISH OIL, fluticasone, SYNTHROID, loratadine, and VESICARE. We will continue to administer triamcinolone acetonide.  Meds ordered this encounter  Medications  . loratadine (CLARITIN) 10 MG tablet    Sig: Take 10 mg by mouth daily.  . VESICARE 10 MG tablet    Sig: Take 1 tablet by mouth daily.     Follow-up: No Follow-up on file.  Walker Kehr, MD

## 2015-07-30 NOTE — Assessment & Plan Note (Signed)
On Rx 

## 2015-07-30 NOTE — Assessment & Plan Note (Signed)
Chronic On Levothroid - 

## 2015-07-30 NOTE — Progress Notes (Signed)
Pre visit review using our clinic review tool, if applicable. No additional management support is needed unless otherwise documented below in the visit note. 

## 2015-08-05 ENCOUNTER — Ambulatory Visit
Admission: RE | Admit: 2015-08-05 | Discharge: 2015-08-05 | Disposition: A | Discharge status: Home / Self Care | Payer: Medicare Other | Source: Ambulatory Visit | Attending: Internal Medicine | Admitting: Internal Medicine

## 2015-08-05 DIAGNOSIS — E042 Nontoxic multinodular goiter: Secondary | ICD-10-CM | POA: Diagnosis not present

## 2015-08-06 ENCOUNTER — Ambulatory Visit
Admission: RE | Admit: 2015-08-06 | Discharge: 2015-08-06 | Disposition: A | Discharge status: Home / Self Care | Payer: Medicare Other | Source: Ambulatory Visit

## 2015-08-06 DIAGNOSIS — Z1231 Encounter for screening mammogram for malignant neoplasm of breast: Secondary | ICD-10-CM | POA: Diagnosis not present

## 2015-08-11 ENCOUNTER — Other Ambulatory Visit: Payer: Self-pay | Admitting: Gynecology

## 2015-08-12 ENCOUNTER — Ambulatory Visit (INDEPENDENT_AMBULATORY_CARE_PROVIDER_SITE_OTHER): Payer: Medicare Other

## 2015-08-12 DIAGNOSIS — Z23 Encounter for immunization: Secondary | ICD-10-CM

## 2015-08-15 ENCOUNTER — Other Ambulatory Visit: Payer: Self-pay | Admitting: Internal Medicine

## 2015-09-17 ENCOUNTER — Ambulatory Visit: Payer: Medicare Other | Admitting: Gynecology

## 2015-09-19 ENCOUNTER — Ambulatory Visit (INDEPENDENT_AMBULATORY_CARE_PROVIDER_SITE_OTHER): Payer: Medicare Other | Admitting: Gynecology

## 2015-09-19 ENCOUNTER — Ambulatory Visit: Payer: Medicare Other | Admitting: Gynecology

## 2015-09-19 ENCOUNTER — Encounter: Payer: Self-pay | Admitting: Gynecology

## 2015-09-19 VITALS — BP 114/60

## 2015-09-19 DIAGNOSIS — R922 Inconclusive mammogram: Secondary | ICD-10-CM | POA: Diagnosis not present

## 2015-09-19 NOTE — Patient Instructions (Signed)
Follow up in 6 months when you're due for your annual exam

## 2015-09-19 NOTE — Progress Notes (Signed)
[name] 1936/08/07 [id number]        79 y.o.  [id number] Presents for her 6 month breast exam. She is very dense breasts and feels most comfortable with every six-month provider exams. No complaints today.  Mammogram 07/2015 normal.  Past medical history,surgical history, problem list, medications, allergies, family history and social history were all reviewed and documented in the EPIC chart.  Directed ROS with pertinent positives and negatives documented in the history of present illness/assessment and plan.  Exam: Kim assistant Filed Vitals:   09/19/15 1019  BP: 114/60   General appearance:  Normal Both breast examined lying to sitting without masses, retractions, discharge or adenopathy.  Assessment/Plan:  79 y.o. [id number] history abnormal breast exam and history of very dense breasts. Follow up in 6 months for annual exam. Sooner if any issues.    Anastasio Auerbach MD, 10:36 AM 09/19/2015

## 2015-09-24 ENCOUNTER — Ambulatory Visit: Payer: Medicare Other | Admitting: Gynecology

## 2015-10-06 ENCOUNTER — Other Ambulatory Visit: Payer: Self-pay | Admitting: Internal Medicine

## 2015-10-14 ENCOUNTER — Other Ambulatory Visit: Payer: Self-pay | Admitting: Internal Medicine

## 2015-11-12 DIAGNOSIS — T63441A Toxic effect of venom of bees, accidental (unintentional), initial encounter: Secondary | ICD-10-CM | POA: Diagnosis not present

## 2015-11-12 DIAGNOSIS — S80812A Abrasion, left lower leg, initial encounter: Secondary | ICD-10-CM | POA: Diagnosis not present

## 2015-11-12 DIAGNOSIS — J3089 Other allergic rhinitis: Secondary | ICD-10-CM | POA: Diagnosis not present

## 2015-11-12 DIAGNOSIS — J4520 Mild intermittent asthma, uncomplicated: Secondary | ICD-10-CM | POA: Diagnosis not present

## 2015-11-25 ENCOUNTER — Other Ambulatory Visit: Payer: Self-pay | Admitting: Internal Medicine

## 2015-12-10 DIAGNOSIS — L570 Actinic keratosis: Secondary | ICD-10-CM | POA: Diagnosis not present

## 2015-12-10 DIAGNOSIS — Z23 Encounter for immunization: Secondary | ICD-10-CM | POA: Diagnosis not present

## 2015-12-10 DIAGNOSIS — L821 Other seborrheic keratosis: Secondary | ICD-10-CM | POA: Diagnosis not present

## 2015-12-12 DIAGNOSIS — R350 Frequency of micturition: Secondary | ICD-10-CM | POA: Diagnosis not present

## 2016-01-26 ENCOUNTER — Other Ambulatory Visit: Payer: Self-pay | Admitting: Internal Medicine

## 2016-01-28 ENCOUNTER — Ambulatory Visit (INDEPENDENT_AMBULATORY_CARE_PROVIDER_SITE_OTHER): Payer: Medicare Other | Admitting: Internal Medicine

## 2016-01-28 ENCOUNTER — Encounter: Payer: Self-pay | Admitting: Internal Medicine

## 2016-01-28 VITALS — BP 128/74 | HR 79 | Ht 61.0 in | Wt 127.0 lb

## 2016-01-28 DIAGNOSIS — E034 Atrophy of thyroid (acquired): Secondary | ICD-10-CM

## 2016-01-28 DIAGNOSIS — Z0000 Encounter for general adult medical examination without abnormal findings: Secondary | ICD-10-CM | POA: Diagnosis not present

## 2016-01-28 DIAGNOSIS — J069 Acute upper respiratory infection, unspecified: Secondary | ICD-10-CM

## 2016-01-28 DIAGNOSIS — I10 Essential (primary) hypertension: Secondary | ICD-10-CM

## 2016-01-28 DIAGNOSIS — E038 Other specified hypothyroidism: Secondary | ICD-10-CM

## 2016-01-28 DIAGNOSIS — H5000 Unspecified esotropia: Secondary | ICD-10-CM | POA: Diagnosis not present

## 2016-01-28 DIAGNOSIS — Z961 Presence of intraocular lens: Secondary | ICD-10-CM | POA: Diagnosis not present

## 2016-01-28 DIAGNOSIS — E042 Nontoxic multinodular goiter: Secondary | ICD-10-CM | POA: Diagnosis not present

## 2016-01-28 MED ORDER — AZITHROMYCIN 250 MG PO TABS: ORAL_TABLET | ORAL | Status: DC

## 2016-01-28 NOTE — Assessment & Plan Note (Signed)
[place] due in 9/17

## 2016-01-28 NOTE — Assessment & Plan Note (Signed)
Mild.

## 2016-01-28 NOTE — Progress Notes (Signed)
Subjective:  Patient ID: [name], female    DOB: 1935/12/28  Age: 80 y.o. MRN: [id number]  CC: No chief complaint on file.   HPI [name] presents for a well exam C/o sinus pain x 1 d C/o B ears itching at times x 2 mo   Outpatient Prescriptions Prior to Visit  Medication Sig Dispense Refill  . albuterol (PROVENTIL HFA;VENTOLIN HFA) 108 (90 BASE) MCG/ACT inhaler Inhale 2 puffs into the lungs every 6 (six) hours as needed for wheezing. 1 Inhaler 3  . amoxicillin (AMOXIL) 500 MG capsule As needed for dental work    . aspirin 81 MG tablet Take 81 mg by mouth daily.      . budesonide-formoterol (SYMBICORT) 80-4.5 MCG/ACT inhaler Inhale 2 puffs into the lungs 2 (two) times daily. 1 Inhaler 5  . Calcium Carbonate-Vitamin D (CALCIUM-VITAMIN D) 500-200 MG-UNIT per tablet Take 3 tablets by mouth daily.     . Cholecalciferol (VITAMIN D) 400 UNITS capsule Take 400 Units by mouth daily.    [name] DEXILANT 60 MG capsule TAKE (1) CAPSULE DAILY. 30 capsule 0  . EPIPEN 2-PAK 0.3 MG/0.3ML SOAJ injection Inject 0.3 mg as directed as needed.     [name] ESTRACE VAGINAL 0.1 MG/GM vaginal cream PLACE 1 APPLICATORFUL VAGINALLY 3 TIMES A WEEK. 42.5 g 3  . fluticasone (FLONASE) 50 MCG/ACT nasal spray USE 2 SPRAYS IN EACH NOSTRIL ONCE DAILY 16 g 0  . ipratropium (ATROVENT) 0.06 % nasal spray Place 1 spray into the nose 3 (three) times daily as needed for rhinitis. 15 mL 0  . loratadine (CLARITIN) 10 MG tablet Take 10 mg by mouth daily.    [name] LORazepam (ATIVAN) 1 MG tablet TAKE 1 OR 2 TABLETS TWICE A DAY AS NEEDED FOR ANXIETY. 100 tablet 3  . montelukast (SINGULAIR) 10 MG tablet Take 10 mg by mouth at bedtime.     . Omega-3 Fatty Acids ([employer] FISH OIL) 1000 MG CAPS Take 1 capsule by mouth daily. 90 capsule   . omeprazole (PRILOSEC) 40 MG capsule TAKE (1) CAPSULE DAILY. 30 capsule 3  . SALINE NASAL SPRAY NA Place into the nose.    . simvastatin (ZOCOR) 40 MG tablet TAKE 1 TABLET ONCE DAILY. 90 tablet 3  . SYNTHROID  50 MCG tablet TAKE 1 TABLET ONCE DAILY BEFORE BREAKFAST. 30 tablet 11  . VESICARE 10 MG tablet Take 1 tablet by mouth daily.     Facility-Administered Medications Prior to Visit  Medication Dose Route Frequency Provider Last Rate Last Dose  . triamcinolone acetonide (KENALOG) 10 MG/ML injection 10 mg  10 mg Other Once [name], [name]        ROS Review of Systems  Constitutional: Positive for fever. Negative for chills, activity change, appetite change, fatigue and unexpected weight change.  HENT: Positive for sinus pressure. Negative for congestion, dental problem, ear discharge, ear pain, mouth sores, sore throat and voice change.   Eyes: Negative for visual disturbance.  Respiratory: Negative for cough and chest tightness.   Gastrointestinal: Negative for nausea, vomiting and abdominal pain.  Genitourinary: Negative for frequency, difficulty urinating and vaginal pain.  Musculoskeletal: Negative for back pain and gait problem.  Skin: Negative for pallor and rash.  Neurological: Negative for dizziness, tremors, weakness, numbness and headaches.  Psychiatric/Behavioral: Negative for suicidal ideas, confusion and sleep disturbance.    Objective:  BP 128/74 mmHg  Pulse 79  Ht 5\' 1"  (1.549 m)  Wt 127 lb (57.607 kg)  BMI 24.01  kg/m2  SpO2 94%  BP Readings from Last 3 Encounters:  01/28/16 128/74  09/19/15 114/60  07/30/15 128/80    Wt Readings from Last 3 Encounters:  01/28/16 127 lb (57.607 kg)  02/19/15 124 lb (56.246 kg)  12/04/14 128 lb (58.06 kg)    Physical Exam  Constitutional: She appears well-developed. No distress.  HENT:  Head: Normocephalic.  Right Ear: External ear normal.  Left Ear: External ear normal.  Nose: Nose normal.  Mouth/Throat: Oropharynx is clear and moist.  Eyes: Conjunctivae are normal. Pupils are equal, round, and reactive to light. Right eye exhibits no discharge. Left eye exhibits no discharge.  Neck: Normal range of motion. Neck  supple. No JVD present. No tracheal deviation present. No thyromegaly present.  Cardiovascular: Normal rate, regular rhythm and normal heart sounds.   Pulmonary/Chest: No stridor. No respiratory distress. She has no wheezes.  Abdominal: Soft. Bowel sounds are normal. She exhibits no distension and no mass. There is no tenderness. There is no rebound and no guarding.  Musculoskeletal: She exhibits no edema or tenderness.  Lymphadenopathy:    She has no cervical adenopathy.  Neurological: She displays normal reflexes. No cranial nerve deficit. She exhibits normal muscle tone. Coordination normal.  Skin: No rash noted. No erythema.  Psychiatric: She has a normal mood and affect. Her behavior is normal. Judgment and thought content normal.  face - tender over cheek bones Wax B Mild goiter  Lab Results  Component Value Date   WBC 5.4 01/03/2015   HGB 14.1 01/03/2015   HCT 41.3 01/03/2015   PLT 240.0 01/03/2015   GLUCOSE 89 03/07/2015   CHOL 178 04/10/2015   TRIG 189.0* 04/10/2015   HDL 49.30 04/10/2015   LDLCALC 91 04/10/2015   ALT 32 03/07/2015   AST 21 03/07/2015   NA 136 03/07/2015   K 4.2 03/07/2015   CL 104 03/07/2015   CREATININE 1.17 03/07/2015   BUN 17 03/07/2015   CO2 26 03/07/2015   TSH 3.26 07/29/2015   HGBA1C 6.0 12/05/2006    Mm Screening Breast Tomo Bilateral  08/06/2015  CLINICAL DATA:  Screening. EXAM: DIGITAL SCREENING BILATERAL MAMMOGRAM WITH 3D TOMO WITH CAD COMPARISON:  Previous exam(s). ACR Breast Density Category b: There are scattered areas of fibroglandular density. FINDINGS: There are no findings suspicious for malignancy. Images were processed with CAD. IMPRESSION: No mammographic evidence of malignancy. A result letter of this screening mammogram will be mailed directly to the patient. RECOMMENDATION: Screening mammogram in one year. (Code:SM-B-01Y) BI-RADS CATEGORY  1: Negative. Electronically Signed   By: Altamese Cabal M.D.   On: 08/06/2015 14:02     Assessment & Plan:   Diagnoses and all orders for this visit:  Well adult exam  Multiple thyroid nodules  Hypothyroidism due to acquired atrophy of thyroid  Essential hypertension, benign  Other orders -     azithromycin (ZITHROMAX) 250 MG tablet; As directed  I am having [name] start on azithromycin. I am also having her maintain her aspirin, calcium-vitamin D, SALINE NASAL SPRAY NA, Vitamin D, budesonide-formoterol, albuterol, ipratropium, EPIPEN 2-PAK, montelukast, amoxicillin, [employer] FISH OIL, SYNTHROID, loratadine, VESICARE, ESTRACE VAGINAL, simvastatin, LORazepam, fluticasone, omeprazole, and DEXILANT. We will continue to administer triamcinolone acetonide.  Meds ordered this encounter  Medications  . azithromycin (ZITHROMAX) 250 MG tablet    Sig: As directed    Dispense:  6 tablet    Refill:  0     Follow-up: Return for a follow-up visit.  Walker Kehr, MD

## 2016-01-28 NOTE — Progress Notes (Signed)
Pre visit review using our clinic review tool, if applicable. No additional management support is needed unless otherwise documented below in the visit note. 

## 2016-01-28 NOTE — Patient Instructions (Signed)
Preventive Care for Adults, Female A healthy lifestyle and preventive care can promote health and wellness. Preventive health guidelines for women include the following key practices.  A routine yearly physical is a good way to check with your health care provider about your health and preventive screening. It is a chance to share any concerns and updates on your health and to receive a thorough exam.  Visit your dentist for a routine exam and preventive care every 6 months. Brush your teeth twice a day and floss once a day. Good oral hygiene prevents tooth decay and gum disease.  The frequency of eye exams is based on your age, health, family medical history, use of contact lenses, and other factors. Follow your health care provider's recommendations for frequency of eye exams.  Eat a healthy diet. Foods like vegetables, fruits, whole grains, low-fat dairy products, and lean protein foods contain the nutrients you need without too many calories. Decrease your intake of foods high in solid fats, added sugars, and salt. Eat the right amount of calories for you.Get information about a proper diet from your health care provider, if necessary.  Regular physical exercise is one of the most important things you can do for your health. Most adults should get at least 150 minutes of moderate-intensity exercise (any activity that increases your heart rate and causes you to sweat) each week. In addition, most adults need muscle-strengthening exercises on 2 or more days a week.  Maintain a healthy weight. The body mass index (BMI) is a screening tool to identify possible weight problems. It provides an estimate of body fat based on height and weight. Your health care provider can find your BMI and can help you achieve or maintain a healthy weight.For adults 20 years and older:  A BMI below 18.5 is considered underweight.  A BMI of 18.5 to 24.9 is normal.  A BMI of 25 to 29.9 is considered overweight.  A  BMI of 30 and above is considered obese.  Maintain normal blood lipids and cholesterol levels by exercising and minimizing your intake of saturated fat. Eat a balanced diet with plenty of fruit and vegetables. Blood tests for lipids and cholesterol should begin at age 45 and be repeated every 5 years. If your lipid or cholesterol levels are high, you are over 50, or you are at high risk for heart disease, you may need your cholesterol levels checked more frequently.Ongoing high lipid and cholesterol levels should be treated with medicines if diet and exercise are not working.  If you smoke, find out from your health care provider how to quit. If you do not use tobacco, do not start.  Lung cancer screening is recommended for adults aged 45-80 years who are at high risk for developing lung cancer because of a history of smoking. A yearly low-dose CT scan of the lungs is recommended for people who have at least a 30-pack-year history of smoking and are a current smoker or have quit within the past 15 years. A pack year of smoking is smoking an average of 1 pack of cigarettes a day for 1 year (for example: 1 pack a day for 30 years or 2 packs a day for 15 years). Yearly screening should continue until the smoker has stopped smoking for at least 15 years. Yearly screening should be stopped for people who develop a health problem that would prevent them from having lung cancer treatment.  If you are pregnant, do not drink alcohol. If you are  breastfeeding, be very cautious about drinking alcohol. If you are not pregnant and choose to drink alcohol, do not have more than 1 drink per day. One drink is considered to be 12 ounces (355 mL) of beer, 5 ounces (148 mL) of wine, or 1.5 ounces (44 mL) of liquor.  Avoid use of street drugs. Do not share needles with anyone. Ask for help if you need support or instructions about stopping the use of drugs.  High blood pressure causes heart disease and increases the risk  of stroke. Your blood pressure should be checked at least every 1 to 2 years. Ongoing high blood pressure should be treated with medicines if weight loss and exercise do not work.  If you are 55-79 years old, ask your health care provider if you should take aspirin to prevent strokes.  Diabetes screening is done by taking a blood sample to check your blood glucose level after you have not eaten for a certain period of time (fasting). If you are not overweight and you do not have risk factors for diabetes, you should be screened once every 3 years starting at age 45. If you are overweight or obese and you are 40-70 years of age, you should be screened for diabetes every year as part of your cardiovascular risk assessment.  Breast cancer screening is essential preventive care for women. You should practice "breast self-awareness." This means understanding the normal appearance and feel of your breasts and may include breast self-examination. Any changes detected, no matter how small, should be reported to a health care provider. Women in their 20s and 30s should have a clinical breast exam (CBE) by a health care provider as part of a regular health exam every 1 to 3 years. After age 40, women should have a CBE every year. Starting at age 40, women should consider having a mammogram (breast X-ray test) every year. Women who have a family history of breast cancer should talk to their health care provider about genetic screening. Women at a high risk of breast cancer should talk to their health care providers about having an MRI and a mammogram every year.  Breast cancer gene (BRCA)-related cancer risk assessment is recommended for women who have family members with BRCA-related cancers. BRCA-related cancers include breast, ovarian, tubal, and peritoneal cancers. Having family members with these cancers may be associated with an increased risk for harmful changes (mutations) in the breast cancer genes BRCA1 and  BRCA2. Results of the assessment will determine the need for genetic counseling and BRCA1 and BRCA2 testing.  Your health care provider may recommend that you be screened regularly for cancer of the pelvic organs (ovaries, uterus, and vagina). This screening involves a pelvic examination, including checking for microscopic changes to the surface of your cervix (Pap test). You may be encouraged to have this screening done every 3 years, beginning at age 21.  For women ages 30-65, health care providers may recommend pelvic exams and Pap testing every 3 years, or they may recommend the Pap and pelvic exam, combined with testing for human papilloma virus (HPV), every 5 years. Some types of HPV increase your risk of cervical cancer. Testing for HPV may also be done on women of any age with unclear Pap test results.  Other health care providers may not recommend any screening for nonpregnant women who are considered low risk for pelvic cancer and who do not have symptoms. Ask your health care provider if a screening pelvic exam is right for   you.  If you have had past treatment for cervical cancer or a condition that could lead to cancer, you need Pap tests and screening for cancer for at least 20 years after your treatment. If Pap tests have been discontinued, your risk factors (such as having a new sexual partner) need to be reassessed to determine if screening should resume. Some women have medical problems that increase the chance of getting cervical cancer. In these cases, your health care provider may recommend more frequent screening and Pap tests.  Colorectal cancer can be detected and often prevented. Most routine colorectal cancer screening begins at the age of 50 years and continues through age 75 years. However, your health care provider may recommend screening at an earlier age if you have risk factors for colon cancer. On a yearly basis, your health care provider may provide home test kits to check  for hidden blood in the stool. Use of a small camera at the end of a tube, to directly examine the colon (sigmoidoscopy or colonoscopy), can detect the earliest forms of colorectal cancer. Talk to your health care provider about this at age 50, when routine screening begins. Direct exam of the colon should be repeated every 5-10 years through age 75 years, unless early forms of precancerous polyps or small growths are found.  People who are at an increased risk for hepatitis B should be screened for this virus. You are considered at high risk for hepatitis B if:  You were born in a country where hepatitis B occurs often. Talk with your health care provider about which countries are considered high risk.  Your parents were born in a high-risk country and you have not received a shot to protect against hepatitis B (hepatitis B vaccine).  You have HIV or AIDS.  You use needles to inject street drugs.  You live with, or have sex with, someone who has hepatitis B.  You get hemodialysis treatment.  You take certain medicines for conditions like cancer, organ transplantation, and autoimmune conditions.  Hepatitis C blood testing is recommended for all people born from 1945 through 1965 and any individual with known risks for hepatitis C.  Practice safe sex. Use condoms and avoid high-risk sexual practices to reduce the spread of sexually transmitted infections (STIs). STIs include gonorrhea, chlamydia, syphilis, trichomonas, herpes, HPV, and human immunodeficiency virus (HIV). Herpes, HIV, and HPV are viral illnesses that have no cure. They can result in disability, cancer, and death.  You should be screened for sexually transmitted illnesses (STIs) including gonorrhea and chlamydia if:  You are sexually active and are younger than 24 years.  You are older than 24 years and your health care provider tells you that you are at risk for this type of infection.  Your sexual activity has changed  since you were last screened and you are at an increased risk for chlamydia or gonorrhea. Ask your health care provider if you are at risk.  If you are at risk of being infected with HIV, it is recommended that you take a prescription medicine daily to prevent HIV infection. This is called preexposure prophylaxis (PrEP). You are considered at risk if:  You are sexually active and do not regularly use condoms or know the HIV status of your partner(s).  You take drugs by injection.  You are sexually active with a partner who has HIV.  Talk with your health care provider about whether you are at high risk of being infected with HIV. If   you choose to begin PrEP, you should first be tested for HIV. You should then be tested every 3 months for as long as you are taking PrEP.  Osteoporosis is a disease in which the bones lose minerals and strength with aging. This can result in serious bone fractures or breaks. The risk of osteoporosis can be identified using a bone density scan. Women ages 67 years and over and women at risk for fractures or osteoporosis should discuss screening with their health care providers. Ask your health care provider whether you should take a calcium supplement or vitamin D to reduce the rate of osteoporosis.  Menopause can be associated with physical symptoms and risks. Hormone replacement therapy is available to decrease symptoms and risks. You should talk to your health care provider about whether hormone replacement therapy is right for you.  Use sunscreen. Apply sunscreen liberally and repeatedly throughout the day. You should seek shade when your shadow is shorter than you. Protect yourself by wearing long sleeves, pants, a wide-brimmed hat, and sunglasses year round, whenever you are outdoors.  Once a month, do a whole body skin exam, using a mirror to look at the skin on your back. Tell your health care provider of new moles, moles that have irregular borders, moles that  are larger than a pencil eraser, or moles that have changed in shape or color.  Stay current with required vaccines (immunizations).  Influenza vaccine. All adults should be immunized every year.  Tetanus, diphtheria, and acellular pertussis (Td, Tdap) vaccine. Pregnant women should receive 1 dose of Tdap vaccine during each pregnancy. The dose should be obtained regardless of the length of time since the last dose. Immunization is preferred during the 27th-36th week of gestation. An adult who has not previously received Tdap or who does not know her vaccine status should receive 1 dose of Tdap. This initial dose should be followed by tetanus and diphtheria toxoids (Td) booster doses every 10 years. Adults with an unknown or incomplete history of completing a 3-dose immunization series with Td-containing vaccines should begin or complete a primary immunization series including a Tdap dose. Adults should receive a Td booster every 10 years.  Varicella vaccine. An adult without evidence of immunity to varicella should receive 2 doses or a second dose if she has previously received 1 dose. Pregnant females who do not have evidence of immunity should receive the first dose after pregnancy. This first dose should be obtained before leaving the health care facility. The second dose should be obtained 4-8 weeks after the first dose.  Human papillomavirus (HPV) vaccine. Females aged 13-26 years who have not received the vaccine previously should obtain the 3-dose series. The vaccine is not recommended for use in pregnant females. However, pregnancy testing is not needed before receiving a dose. If a female is found to be pregnant after receiving a dose, no treatment is needed. In that case, the remaining doses should be delayed until after the pregnancy. Immunization is recommended for any person with an immunocompromised condition through the age of 61 years if she did not get any or all doses earlier. During the  3-dose series, the second dose should be obtained 4-8 weeks after the first dose. The third dose should be obtained 24 weeks after the first dose and 16 weeks after the second dose.  Zoster vaccine. One dose is recommended for adults aged 30 years or older unless certain conditions are present.  Measles, mumps, and rubella (MMR) vaccine. Adults born  before 1957 generally are considered immune to measles and mumps. Adults born in 1957 or later should have 1 or more doses of MMR vaccine unless there is a contraindication to the vaccine or there is laboratory evidence of immunity to each of the three diseases. A routine second dose of MMR vaccine should be obtained at least 28 days after the first dose for students attending postsecondary schools, health care workers, or international travelers. People who received inactivated measles vaccine or an unknown type of measles vaccine during 1963-1967 should receive 2 doses of MMR vaccine. People who received inactivated mumps vaccine or an unknown type of mumps vaccine before 1979 and are at high risk for mumps infection should consider immunization with 2 doses of MMR vaccine. For females of childbearing age, rubella immunity should be determined. If there is no evidence of immunity, females who are not pregnant should be vaccinated. If there is no evidence of immunity, females who are pregnant should delay immunization until after pregnancy. Unvaccinated health care workers born before 1957 who lack laboratory evidence of measles, mumps, or rubella immunity or laboratory confirmation of disease should consider measles and mumps immunization with 2 doses of MMR vaccine or rubella immunization with 1 dose of MMR vaccine.  Pneumococcal 13-valent conjugate (PCV13) vaccine. When indicated, a person who is uncertain of his immunization history and has no record of immunization should receive the PCV13 vaccine. All adults 65 years of age and older should receive this  vaccine. An adult aged 19 years or older who has certain medical conditions and has not been previously immunized should receive 1 dose of PCV13 vaccine. This PCV13 should be followed with a dose of pneumococcal polysaccharide (PPSV23) vaccine. Adults who are at high risk for pneumococcal disease should obtain the PPSV23 vaccine at least 8 weeks after the dose of PCV13 vaccine. Adults older than 78 years of age who have normal immune system function should obtain the PPSV23 vaccine dose at least 1 year after the dose of PCV13 vaccine.  Pneumococcal polysaccharide (PPSV23) vaccine. When PCV13 is also indicated, PCV13 should be obtained first. All adults aged 65 years and older should be immunized. An adult younger than age 65 years who has certain medical conditions should be immunized. Any person who resides in a nursing home or long-term care facility should be immunized. An adult smoker should be immunized. People with an immunocompromised condition and certain other conditions should receive both PCV13 and PPSV23 vaccines. People with human immunodeficiency virus (HIV) infection should be immunized as soon as possible after diagnosis. Immunization during chemotherapy or radiation therapy should be avoided. Routine use of PPSV23 vaccine is not recommended for American Indians, [place] Natives, or people younger than 65 years unless there are medical conditions that require PPSV23 vaccine. When indicated, people who have unknown immunization and have no record of immunization should receive PPSV23 vaccine. One-time revaccination 5 years after the first dose of PPSV23 is recommended for people aged 19-64 years who have chronic kidney failure, nephrotic syndrome, asplenia, or immunocompromised conditions. People who received 1-2 doses of PPSV23 before age 65 years should receive another dose of PPSV23 vaccine at age 65 years or later if at least 5 years have passed since the previous dose. Doses of PPSV23 are not  needed for people immunized with PPSV23 at or after age 65 years.  Meningococcal vaccine. Adults with asplenia or persistent complement component deficiencies should receive 2 doses of quadrivalent meningococcal conjugate (MenACWY-D) vaccine. The doses should be obtained   at least 2 months apart. Microbiologists working with certain meningococcal bacteria, [place] recruits, people at risk during an outbreak, and people who travel to or live in countries with a high rate of meningitis should be immunized. A first-year college student up through age 34 years who is living in a residence hall should receive a dose if she did not receive a dose on or after her 16th birthday. Adults who have certain high-risk conditions should receive one or more doses of vaccine.  Hepatitis A vaccine. Adults who wish to be protected from this disease, have certain high-risk conditions, work with hepatitis A-infected animals, work in hepatitis A research labs, or travel to or work in countries with a high rate of hepatitis A should be immunized. Adults who were previously unvaccinated and who anticipate close contact with an international adoptee during the first 60 days after arrival in the [place] from a country with a high rate of hepatitis A should be immunized.  Hepatitis B vaccine. Adults who wish to be protected from this disease, have certain high-risk conditions, may be exposed to blood or other infectious body fluids, are household contacts or sex partners of hepatitis B positive people, are clients or workers in certain care facilities, or travel to or work in countries with a high rate of hepatitis B should be immunized.  Haemophilus influenzae type b (Hib) vaccine. A previously unvaccinated person with asplenia or sickle cell disease or having a scheduled splenectomy should receive 1 dose of Hib vaccine. Regardless of previous immunization, a recipient of a hematopoietic stem cell transplant should receive a  3-dose series 6-12 months after her successful transplant. Hib vaccine is not recommended for adults with HIV infection. Preventive Services / Frequency Ages 35 to 4 years  Blood pressure check.** / Every 3-5 years.  Lipid and cholesterol check.** / Every 5 years beginning at age 60.  Clinical breast exam.** / Every 3 years for women in their 71s and 10s.  BRCA-related cancer risk assessment.** / For women who have family members with a BRCA-related cancer (breast, ovarian, tubal, or peritoneal cancers).  Pap test.** / Every 2 years from ages 76 through 26. Every 3 years starting at age 61 through age 76 or 93 with a history of 3 consecutive normal Pap tests.  HPV screening.** / Every 3 years from ages 37 through ages 60 to 51 with a history of 3 consecutive normal Pap tests.  Hepatitis C blood test.** / For any individual with known risks for hepatitis C.  Skin self-exam. / Monthly.  Influenza vaccine. / Every year.  Tetanus, diphtheria, and acellular pertussis (Tdap, Td) vaccine.** / Consult your health care provider. Pregnant women should receive 1 dose of Tdap vaccine during each pregnancy. 1 dose of Td every 10 years.  Varicella vaccine.** / Consult your health care provider. Pregnant females who do not have evidence of immunity should receive the first dose after pregnancy.  HPV vaccine. / 3 doses over 6 months, if 93 and younger. The vaccine is not recommended for use in pregnant females. However, pregnancy testing is not needed before receiving a dose.  Measles, mumps, rubella (MMR) vaccine.** / You need at least 1 dose of MMR if you were born in 1957 or later. You may also need a 2nd dose. For females of childbearing age, rubella immunity should be determined. If there is no evidence of immunity, females who are not pregnant should be vaccinated. If there is no evidence of immunity, females who are  pregnant should delay immunization until after pregnancy.  Pneumococcal  13-valent conjugate (PCV13) vaccine.** / Consult your health care provider.  Pneumococcal polysaccharide (PPSV23) vaccine.** / 1 to 2 doses if you smoke cigarettes or if you have certain conditions.  Meningococcal vaccine.** / 1 dose if you are age 68 to 8 years and a Market researcher living in a residence hall, or have one of several medical conditions, you need to get vaccinated against meningococcal disease. You may also need additional booster doses.  Hepatitis A vaccine.** / Consult your health care provider.  Hepatitis B vaccine.** / Consult your health care provider.  Haemophilus influenzae type b (Hib) vaccine.** / Consult your health care provider. Ages 7 to 53 years  Blood pressure check.** / Every year.  Lipid and cholesterol check.** / Every 5 years beginning at age 25 years.  Lung cancer screening. / Every year if you are aged 11-80 years and have a 30-pack-year history of smoking and currently smoke or have quit within the past 15 years. Yearly screening is stopped once you have quit smoking for at least 15 years or develop a health problem that would prevent you from having lung cancer treatment.  Clinical breast exam.** / Every year after age 48 years.  BRCA-related cancer risk assessment.** / For women who have family members with a BRCA-related cancer (breast, ovarian, tubal, or peritoneal cancers).  Mammogram.** / Every year beginning at age 41 years and continuing for as long as you are in good health. Consult with your health care provider.  Pap test.** / Every 3 years starting at age 65 years through age 37 or 70 years with a history of 3 consecutive normal Pap tests.  HPV screening.** / Every 3 years from ages 72 years through ages 60 to 40 years with a history of 3 consecutive normal Pap tests.  Fecal occult blood test (FOBT) of stool. / Every year beginning at age 21 years and continuing until age 5 years. You may not need to do this test if you get  a colonoscopy every 10 years.  Flexible sigmoidoscopy or colonoscopy.** / Every 5 years for a flexible sigmoidoscopy or every 10 years for a colonoscopy beginning at age 35 years and continuing until age 48 years.  Hepatitis C blood test.** / For all people born from [birth year] through 1965 and any individual with known risks for hepatitis C.  Skin self-exam. / Monthly.  Influenza vaccine. / Every year.  Tetanus, diphtheria, and acellular pertussis (Tdap/Td) vaccine.** / Consult your health care provider. Pregnant women should receive 1 dose of Tdap vaccine during each pregnancy. 1 dose of Td every 10 years.  Varicella vaccine.** / Consult your health care provider. Pregnant females who do not have evidence of immunity should receive the first dose after pregnancy.  Zoster vaccine.** / 1 dose for adults aged 30 years or older.  Measles, mumps, rubella (MMR) vaccine.** / You need at least 1 dose of MMR if you were born in 1957 or later. You may also need a second dose. For females of childbearing age, rubella immunity should be determined. If there is no evidence of immunity, females who are not pregnant should be vaccinated. If there is no evidence of immunity, females who are pregnant should delay immunization until after pregnancy.  Pneumococcal 13-valent conjugate (PCV13) vaccine.** / Consult your health care provider.  Pneumococcal polysaccharide (PPSV23) vaccine.** / 1 to 2 doses if you smoke cigarettes or if you have certain conditions.  Meningococcal vaccine.** /  Consult your health care provider.  Hepatitis A vaccine.** / Consult your health care provider.  Hepatitis B vaccine.** / Consult your health care provider.  Haemophilus influenzae type b (Hib) vaccine.** / Consult your health care provider. Ages 64 years and over  Blood pressure check.** / Every year.  Lipid and cholesterol check.** / Every 5 years beginning at age 23 years.  Lung cancer screening. / Every year if you  are aged 16-80 years and have a 30-pack-year history of smoking and currently smoke or have quit within the past 15 years. Yearly screening is stopped once you have quit smoking for at least 15 years or develop a health problem that would prevent you from having lung cancer treatment.  Clinical breast exam.** / Every year after age 74 years.  BRCA-related cancer risk assessment.** / For women who have family members with a BRCA-related cancer (breast, ovarian, tubal, or peritoneal cancers).  Mammogram.** / Every year beginning at age 44 years and continuing for as long as you are in good health. Consult with your health care provider.  Pap test.** / Every 3 years starting at age 58 years through age 22 or 39 years with 3 consecutive normal Pap tests. Testing can be stopped between 65 and 70 years with 3 consecutive normal Pap tests and no abnormal Pap or HPV tests in the past 10 years.  HPV screening.** / Every 3 years from ages 64 years through ages 70 or 61 years with a history of 3 consecutive normal Pap tests. Testing can be stopped between 65 and 70 years with 3 consecutive normal Pap tests and no abnormal Pap or HPV tests in the past 10 years.  Fecal occult blood test (FOBT) of stool. / Every year beginning at age 40 years and continuing until age 27 years. You may not need to do this test if you get a colonoscopy every 10 years.  Flexible sigmoidoscopy or colonoscopy.** / Every 5 years for a flexible sigmoidoscopy or every 10 years for a colonoscopy beginning at age 7 years and continuing until age 32 years.  Hepatitis C blood test.** / For all people born from [birth year] through 1965 and any individual with known risks for hepatitis C.  Osteoporosis screening.** / A one-time screening for women ages 30 years and over and women at risk for fractures or osteoporosis.  Skin self-exam. / Monthly.  Influenza vaccine. / Every year.  Tetanus, diphtheria, and acellular pertussis (Tdap/Td)  vaccine.** / 1 dose of Td every 10 years.  Varicella vaccine.** / Consult your health care provider.  Zoster vaccine.** / 1 dose for adults aged 35 years or older.  Pneumococcal 13-valent conjugate (PCV13) vaccine.** / Consult your health care provider.  Pneumococcal polysaccharide (PPSV23) vaccine.** / 1 dose for all adults aged 46 years and older.  Meningococcal vaccine.** / Consult your health care provider.  Hepatitis A vaccine.** / Consult your health care provider.  Hepatitis B vaccine.** / Consult your health care provider.  Haemophilus influenzae type b (Hib) vaccine.** / Consult your health care provider. ** Family history and personal history of risk and conditions may change your health care provider's recommendations.   This information is not intended to replace advice given to you by your health care provider. Make sure you discuss any questions you have with your health care provider.   Document Released: 01/11/2002 Document Revised: 12/06/2014 Document Reviewed: 04/12/2011 Elsevier Interactive Patient Education [employer].

## 2016-01-28 NOTE — Assessment & Plan Note (Signed)
Here for medicare wellness/physical  Diet: heart healthy  Physical activity: not sedentary  Depression/mood screen: negative  Hearing: intact to whispered voice  Visual acuity: grossly normal, performs annual eye exam  ADLs: capable  Fall risk:low to none  Home safety: good  Cognitive evaluation: intact to orientation, naming, recall and repetition  EOL planning: adv directives, full code/ I agree  I have personally reviewed and have noted  1. The patient's medical, surgical and social history  2. Their use of alcohol, tobacco or illicit drugs  3. Their current medications and supplements  4. The patient's functional ability including ADL's, fall risks, home safety risks and hearing or visual impairment.  5. Diet and physical activities  6. Evidence for depression or mood disorders 7. The roster of all physicians providing medical care to patient - is listed in the Snapshot section of the chart and reviewed today.    Today patient counseled on age appropriate routine health concerns for screening and prevention, each reviewed and up to date or declined. Immunizations reviewed and up to date or declined. Labs ordered and reviewed. Risk factors for depression reviewed and negative. Hearing function and visual acuity are intact. ADLs screened and addressed as needed. Functional ability and level of safety reviewed and appropriate. Education, counseling and referrals performed based on assessed risks today. Patient provided with a copy of personalized plan for preventive services.     

## 2016-01-28 NOTE — Assessment & Plan Note (Signed)
Labs

## 2016-01-29 NOTE — Assessment & Plan Note (Signed)
z pac 

## 2016-02-17 ENCOUNTER — Other Ambulatory Visit (INDEPENDENT_AMBULATORY_CARE_PROVIDER_SITE_OTHER): Payer: Medicare Other

## 2016-02-17 DIAGNOSIS — I10 Essential (primary) hypertension: Secondary | ICD-10-CM | POA: Diagnosis not present

## 2016-02-17 DIAGNOSIS — R5383 Other fatigue: Secondary | ICD-10-CM | POA: Diagnosis not present

## 2016-02-17 DIAGNOSIS — E042 Nontoxic multinodular goiter: Secondary | ICD-10-CM

## 2016-02-17 DIAGNOSIS — E034 Atrophy of thyroid (acquired): Secondary | ICD-10-CM | POA: Diagnosis not present

## 2016-02-17 DIAGNOSIS — E038 Other specified hypothyroidism: Secondary | ICD-10-CM

## 2016-02-17 LAB — CBC WITH DIFFERENTIAL/PLATELET
Basophils Absolute: 0 10*3/uL (ref 0.0–0.1)
Basophils Relative: 0.5 % (ref 0.0–3.0)
Eosinophils Absolute: 0.1 10*3/uL (ref 0.0–0.7)
Eosinophils Relative: 2.4 % (ref 0.0–5.0)
HCT: 40 % (ref 36.0–46.0)
Hemoglobin: 13.6 g/dL (ref 12.0–15.0)
Lymphocytes Relative: 20.4 % (ref 12.0–46.0)
Lymphs Abs: 1.1 10*3/uL (ref 0.7–4.0)
MCHC: 34.1 g/dL (ref 30.0–36.0)
MCV: 91.1 fl (ref 78.0–100.0)
Monocytes Absolute: 0.5 10*3/uL (ref 0.1–1.0)
Monocytes Relative: 10.1 % (ref 3.0–12.0)
Neutro Abs: 3.5 10*3/uL (ref 1.4–7.7)
Neutrophils Relative %: 66.6 % (ref 43.0–77.0)
Platelets: 273 10*3/uL (ref 150.0–400.0)
RBC: 4.4 Mil/uL (ref 3.87–5.11)
RDW: 13.6 % (ref 11.5–15.5)
WBC: 5.3 10*3/uL (ref 4.0–10.5)

## 2016-02-17 LAB — LIPID PANEL
Cholesterol: 184 mg/dL (ref 0–200)
HDL: 49.3 mg/dL (ref >39.00)
LDL Cholesterol: 103 mg/dL — ABNORMAL HIGH (ref 0–99)
NonHDL: 134.83
Total CHOL/HDL Ratio: 4
Triglycerides: 160 mg/dL — ABNORMAL HIGH (ref 0.0–149.0)
VLDL: 32 mg/dL (ref 0.0–40.0)

## 2016-02-17 LAB — BASIC METABOLIC PANEL
BUN: 14 mg/dL (ref 6–23)
CO2: 28 mEq/L (ref 19–32)
Calcium: 9.3 mg/dL (ref 8.4–10.5)
Chloride: 103 mEq/L (ref 96–112)
Creatinine, Ser: 0.75 mg/dL (ref 0.40–1.20)
GFR: 79.51 mL/min (ref >60.00)
Glucose, Bld: 108 mg/dL — ABNORMAL HIGH (ref 70–99)
Potassium: 4.1 mEq/L (ref 3.5–5.1)
Sodium: 139 mEq/L (ref 135–145)

## 2016-02-17 LAB — HEPATIC FUNCTION PANEL
ALT: 22 U/L (ref 0–35)
AST: 22 U/L (ref 0–37)
Albumin: 4.3 g/dL (ref 3.5–5.2)
Alkaline Phosphatase: 46 U/L (ref 39–117)
Bilirubin, Direct: 0.2 mg/dL (ref 0.0–0.3)
Total Bilirubin: 0.7 mg/dL (ref 0.2–1.2)
Total Protein: 7.4 g/dL (ref 6.0–8.3)

## 2016-02-17 LAB — URINALYSIS
Bilirubin Urine: NEGATIVE
Hgb urine dipstick: NEGATIVE
Ketones, ur: NEGATIVE
Leukocytes, UA: NEGATIVE
Nitrite: NEGATIVE
Specific Gravity, Urine: 1.01 (ref 1.000–1.030)
Total Protein, Urine: NEGATIVE
Urine Glucose: NEGATIVE
Urobilinogen, UA: 0.2 (ref 0.0–1.0)
pH: 6 (ref 5.0–8.0)

## 2016-02-17 LAB — T3: T3, Total: 91 ng/dL (ref 76–181)

## 2016-02-17 LAB — TSH: TSH: 2.13 u[IU]/mL (ref 0.35–4.50)

## 2016-02-17 LAB — T4, FREE: Free T4: 1.06 ng/dL (ref 0.60–1.60)

## 2016-02-24 ENCOUNTER — Other Ambulatory Visit: Payer: Self-pay | Admitting: Internal Medicine

## 2016-03-24 ENCOUNTER — Encounter: Payer: Medicare Other | Admitting: Gynecology

## 2016-03-31 ENCOUNTER — Encounter: Payer: Self-pay | Admitting: Gynecology

## 2016-03-31 ENCOUNTER — Ambulatory Visit (INDEPENDENT_AMBULATORY_CARE_PROVIDER_SITE_OTHER): Payer: Medicare Other | Admitting: Gynecology

## 2016-03-31 VITALS — BP 118/74 | Ht 60.5 in

## 2016-03-31 DIAGNOSIS — Z01419 Encounter for gynecological examination (general) (routine) without abnormal findings: Secondary | ICD-10-CM | POA: Diagnosis not present

## 2016-03-31 DIAGNOSIS — Z124 Encounter for screening for malignant neoplasm of cervix: Secondary | ICD-10-CM | POA: Diagnosis not present

## 2016-03-31 DIAGNOSIS — M810 Age-related osteoporosis without current pathological fracture: Secondary | ICD-10-CM

## 2016-03-31 DIAGNOSIS — N952 Postmenopausal atrophic vaginitis: Secondary | ICD-10-CM

## 2016-03-31 NOTE — Addendum Note (Signed)
Addended by: Nelva Nay on: 03/31/2016 11:46 AM   Modules accepted: Orders

## 2016-03-31 NOTE — Progress Notes (Signed)
    Dava Eppler Nov 29, 1936 [id number]        80 y.o.  [id number]  for breast and pelvic exam. Doing well without complaints.  Past medical history,surgical history, problem list, medications, allergies, family history and social history were all reviewed and documented as reviewed in the EPIC chart.  ROS:  Performed with pertinent positives and negatives included in the history, assessment and plan.   Additional significant findings :  none   Exam: [name] assistant Filed Vitals:   03/31/16 1105  BP: 118/74  Height: 5' 0.5" (1.537 m)   General appearance:  Normal affect, orientation and appearance. Skin: Grossly normal HEENT: Without gross lesions.  No cervical or supraclavicular adenopathy. Thyroid normal.  Lungs:  Clear without wheezing, rales or rhonchi Cardiac: RR, without RMG Abdominal:  Soft, nontender, without masses, guarding, rebound, organomegaly or hernia Breasts:  Examined lying and sitting without masses, retractions, discharge or axillary adenopathy. Pelvic:  Ext/BUS/vagina with atrophic changes  Cervix with atrophic changes. Pap smear done  Uterus anteverted, normal size, shape and contour, midline and mobile nontender   Adnexa without masses or tenderness    Anus and perineum normal   Rectovaginal normal sphincter tone without palpated masses or tenderness.    Assessment/Plan:  80 y.o. [id number] female for breast and pelvic exam.   1. Postmenopausal/atrophic genital changes. Without significant hot flushes, night sweats, vaginal dryness or any vaginal bleeding. Continue monitoring report any issues or vaginal bleeding. 2. Osteoporosis 05/2014. T score distal radius -2.5. Spine, left and right hips statistically improved. Had been on Fosamax for years but discontinued 8 years ago. Had discussed treatment options and she declined.  Follow up DEXA July 2018 and she will schedule. 3. History of leiomyoma on ultrasound 2012. Largest measuring 2.6 cm. Exam shows uterus to  be normal size. Indeed a monitor with annual exams. 4. Pap smear 2016. Pap smear done today because patient is extremely uncomfortable with less frequent screening intervals or stop screening altogether. No history of abnormal Pap smears previously. 5. Colonoscopy 2013. Repeat at their recommended interval. 6. Mammography 07/2015. Continue with annual mammography when due. SBE monthly reviewed. 7. Health maintenance. No routine lab work done as this is done at her primary physician's office. Follow up 1 year, sooner as needed.   Anastasio Auerbach MD, 11:31 AM 03/31/2016

## 2016-03-31 NOTE — Patient Instructions (Signed)
Follow up for bone density as scheduled in July.  You may obtain a copy of any labs that were done today by logging onto MyChart as outlined in the instructions provided with your AVS (after visit summary). The office will not call with normal lab results but certainly if there are any significant abnormalities then we will contact you.   Health Maintenance Adopting a healthy lifestyle and getting preventive care can go a long way to promote health and wellness. Talk with your health care provider about what schedule of regular examinations is right for you. This is a good chance for you to check in with your provider about disease prevention and staying healthy. In between checkups, there are plenty of things you can do on your own. Experts have done a lot of research about which lifestyle changes and preventive measures are most likely to keep you healthy. Ask your health care provider for more information. WEIGHT AND DIET  Eat a healthy diet  Be sure to include plenty of vegetables, fruits, low-fat dairy products, and lean protein.  Do not eat a lot of foods high in solid fats, added sugars, or salt.  Get regular exercise. This is one of the most important things you can do for your health.  Most adults should exercise for at least 150 minutes each week. The exercise should increase your heart rate and make you sweat (moderate-intensity exercise).  Most adults should also do strengthening exercises at least twice a week. This is in addition to the moderate-intensity exercise.  Maintain a healthy weight  Body mass index (BMI) is a measurement that can be used to identify possible weight problems. It estimates body fat based on height and weight. Your health care provider can help determine your BMI and help you achieve or maintain a healthy weight.  For females 23 years of age and older:   A BMI below 18.5 is considered underweight.  A BMI of 18.5 to 24.9 is normal.  A BMI of 25 to  29.9 is considered overweight.  A BMI of 30 and above is considered obese.  Watch levels of cholesterol and blood lipids  You should start having your blood tested for lipids and cholesterol at 78 years of age, then have this test every 5 years.  You may need to have your cholesterol levels checked more often if:  Your lipid or cholesterol levels are high.  You are older than 78 years of age.  You are at high risk for heart disease.  CANCER SCREENING   Lung Cancer  Lung cancer screening is recommended for adults 2-12 years old who are at high risk for lung cancer because of a history of smoking.  A yearly low-dose CT scan of the lungs is recommended for people who:  Currently smoke.  Have quit within the past 15 years.  Have at least a 30-pack-year history of smoking. A pack year is smoking an average of one pack of cigarettes a day for 1 year.  Yearly screening should continue until it has been 15 years since you quit.  Yearly screening should stop if you develop a health problem that would prevent you from having lung cancer treatment.  Breast Cancer  Practice breast self-awareness. This means understanding how your breasts normally appear and feel.  It also means doing regular breast self-exams. Let your health care provider know about any changes, no matter how small.  If you are in your 20s or 30s, you should have a clinical  breast exam (CBE) by a health care provider every 1-3 years as part of a regular health exam.  If you are 40 or older, have a CBE every year. Also consider having a breast X-ray (mammogram) every year.  If you have a family history of breast cancer, talk to your health care provider about genetic screening.  If you are at high risk for breast cancer, talk to your health care provider about having an MRI and a mammogram every year.  Breast cancer gene (BRCA) assessment is recommended for women who have family members with BRCA-related  cancers. BRCA-related cancers include:  Breast.  Ovarian.  Tubal.  Peritoneal cancers.  Results of the assessment will determine the need for genetic counseling and BRCA1 and BRCA2 testing. Cervical Cancer Routine pelvic examinations to screen for cervical cancer are no longer recommended for nonpregnant women who are considered low risk for cancer of the pelvic organs (ovaries, uterus, and vagina) and who do not have symptoms. A pelvic examination may be necessary if you have symptoms including those associated with pelvic infections. Ask your health care provider if a screening pelvic exam is right for you.   The Pap test is the screening test for cervical cancer for women who are considered at risk.  If you had a hysterectomy for a problem that was not cancer or a condition that could lead to cancer, then you no longer need Pap tests.  If you are older than 65 years, and you have had normal Pap tests for the past 10 years, you no longer need to have Pap tests.  If you have had past treatment for cervical cancer or a condition that could lead to cancer, you need Pap tests and screening for cancer for at least 20 years after your treatment.  If you no longer get a Pap test, assess your risk factors if they change (such as having a new sexual partner). This can affect whether you should start being screened again.  Some women have medical problems that increase their chance of getting cervical cancer. If this is the case for you, your health care provider may recommend more frequent screening and Pap tests.  The human papillomavirus (HPV) test is another test that may be used for cervical cancer screening. The HPV test looks for the virus that can cause cell changes in the cervix. The cells collected during the Pap test can be tested for HPV.  The HPV test can be used to screen women 30 years of age and older. Getting tested for HPV can extend the interval between normal Pap tests from  three to five years.  An HPV test also should be used to screen women of any age who have unclear Pap test results.  After 78 years of age, women should have HPV testing as often as Pap tests.  Colorectal Cancer  This type of cancer can be detected and often prevented.  Routine colorectal cancer screening usually begins at 78 years of age and continues through 78 years of age.  Your health care provider may recommend screening at an earlier age if you have risk factors for colon cancer.  Your health care provider may also recommend using home test kits to check for hidden blood in the stool.  A small camera at the end of a tube can be used to examine your colon directly (sigmoidoscopy or colonoscopy). This is done to check for the earliest forms of colorectal cancer.  Routine screening usually begins at age   50.  Direct examination of the colon should be repeated every 5-10 years through 78 years of age. However, you may need to be screened more often if early forms of precancerous polyps or small growths are found. Skin Cancer  Check your skin from head to toe regularly.  Tell your health care provider about any new moles or changes in moles, especially if there is a change in a mole's shape or color.  Also tell your health care provider if you have a mole that is larger than the size of a pencil eraser.  Always use sunscreen. Apply sunscreen liberally and repeatedly throughout the day.  Protect yourself by wearing long sleeves, pants, a wide-brimmed hat, and sunglasses whenever you are outside. HEART DISEASE, DIABETES, AND HIGH BLOOD PRESSURE   Have your blood pressure checked at least every 1-2 years. High blood pressure causes heart disease and increases the risk of stroke.  If you are between 70 years and 59 years old, ask your health care provider if you should take aspirin to prevent strokes.  Have regular diabetes screenings. This involves taking a blood sample to check  your fasting blood sugar level.  If you are at a normal weight and have a low risk for diabetes, have this test once every three years after 78 years of age.  If you are overweight and have a high risk for diabetes, consider being tested at a younger age or more often. PREVENTING INFECTION  Hepatitis B  If you have a higher risk for hepatitis B, you should be screened for this virus. You are considered at high risk for hepatitis B if:  You were born in a country where hepatitis B is common. Ask your health care provider which countries are considered high risk.  Your parents were born in a high-risk country, and you have not been immunized against hepatitis B (hepatitis B vaccine).  You have HIV or AIDS.  You use needles to inject street drugs.  You live with someone who has hepatitis B.  You have had sex with someone who has hepatitis B.  You get hemodialysis treatment.  You take certain medicines for conditions, including cancer, organ transplantation, and autoimmune conditions. Hepatitis C  Blood testing is recommended for:  Everyone born from 30 through 1965.  Anyone with known risk factors for hepatitis C. Sexually transmitted infections (STIs)  You should be screened for sexually transmitted infections (STIs) including gonorrhea and chlamydia if:  You are sexually active and are younger than 78 years of age.  You are older than 78 years of age and your health care provider tells you that you are at risk for this type of infection.  Your sexual activity has changed since you were last screened and you are at an increased risk for chlamydia or gonorrhea. Ask your health care provider if you are at risk.  If you do not have HIV, but are at risk, it may be recommended that you take a prescription medicine daily to prevent HIV infection. This is called pre-exposure prophylaxis (PrEP). You are considered at risk if:  You are sexually active and do not regularly use  condoms or know the HIV status of your partner(s).  You take drugs by injection.  You are sexually active with a partner who has HIV. Talk with your health care provider about whether you are at high risk of being infected with HIV. If you choose to begin PrEP, you should first be tested for HIV. You should  then be tested every 3 months for as long as you are taking PrEP.  PREGNANCY   If you are premenopausal and you may become pregnant, ask your health care provider about preconception counseling.  If you may become pregnant, take 400 to 800 micrograms (mcg) of folic acid every day.  If you want to prevent pregnancy, talk to your health care provider about birth control (contraception). OSTEOPOROSIS AND MENOPAUSE   Osteoporosis is a disease in which the bones lose minerals and strength with aging. This can result in serious bone fractures. Your risk for osteoporosis can be identified using a bone density scan.  If you are 53 years of age or older, or if you are at risk for osteoporosis and fractures, ask your health care provider if you should be screened.  Ask your health care provider whether you should take a calcium or vitamin D supplement to lower your risk for osteoporosis.  Menopause may have certain physical symptoms and risks.  Hormone replacement therapy may reduce some of these symptoms and risks. Talk to your health care provider about whether hormone replacement therapy is right for you.  HOME CARE INSTRUCTIONS   Schedule regular health, dental, and eye exams.  Stay current with your immunizations.   Do not use any tobacco products including cigarettes, chewing tobacco, or electronic cigarettes.  If you are pregnant, do not drink alcohol.  If you are breastfeeding, limit how much and how often you drink alcohol.  Limit alcohol intake to no more than 1 drink per day for nonpregnant women. One drink equals 12 ounces of beer, 5 ounces of wine, or 1 ounces of hard  liquor.  Do not use street drugs.  Do not share needles.  Ask your health care provider for help if you need support or information about quitting drugs.  Tell your health care provider if you often feel depressed.  Tell your health care provider if you have ever been abused or do not feel safe at home. Document Released: 05/31/2011 Document Revised: 04/01/2014 Document Reviewed: 10/17/2013 [facility] Patient Information 2015 [place], [place]. This information is not intended to replace advice given to you by your health care provider. Make sure you discuss any questions you have with your health care provider.

## 2016-04-02 LAB — PAP IG W/ RFLX HPV ASCU

## 2016-04-14 ENCOUNTER — Ambulatory Visit: Payer: Medicare Other | Admitting: Podiatry

## 2016-04-15 ENCOUNTER — Encounter: Payer: Self-pay | Admitting: Podiatry

## 2016-04-15 ENCOUNTER — Ambulatory Visit (INDEPENDENT_AMBULATORY_CARE_PROVIDER_SITE_OTHER): Payer: Medicare Other | Admitting: Podiatry

## 2016-04-15 DIAGNOSIS — L84 Corns and callosities: Secondary | ICD-10-CM

## 2016-04-15 NOTE — Progress Notes (Signed)
Subjective:     Patient ID: [name], female   DOB: Aug 31, 1936, 80 y.o.   MRN: [id number]  HPI patient presents with painful lesions on both feet   Review of Systems     Objective:   Physical Exam Neurovascular status intact with keratotic lesions bilateral    Assessment:     Lesion secondary to bone pressure    Plan:     Debride lesions bilateral with no iatrogenic bleeding noted

## 2016-04-20 ENCOUNTER — Encounter: Payer: Self-pay | Admitting: Internal Medicine

## 2016-04-20 ENCOUNTER — Ambulatory Visit (INDEPENDENT_AMBULATORY_CARE_PROVIDER_SITE_OTHER): Payer: Medicare Other | Admitting: Internal Medicine

## 2016-04-20 ENCOUNTER — Telehealth: Payer: Self-pay | Admitting: Internal Medicine

## 2016-04-20 VITALS — BP 140/70 | HR 75 | Temp 97.4°F

## 2016-04-20 DIAGNOSIS — R21 Rash and other nonspecific skin eruption: Secondary | ICD-10-CM

## 2016-04-20 DIAGNOSIS — K219 Gastro-esophageal reflux disease without esophagitis: Secondary | ICD-10-CM | POA: Diagnosis not present

## 2016-04-20 MED ORDER — METHYLPREDNISOLONE ACETATE 80 MG/ML IJ SUSP
80.0000 mg | Freq: Once | INTRAMUSCULAR | Status: AC
  Administered 2016-04-20: 80 mg via INTRAMUSCULAR

## 2016-04-20 MED ORDER — HALOBETASOL PROPIONATE 0.05 % EX CREA: TOPICAL_CREAM | Freq: Two times a day (BID) | CUTANEOUS | Status: DC

## 2016-04-20 NOTE — Telephone Encounter (Signed)
Got scheduled  °

## 2016-04-20 NOTE — Progress Notes (Signed)
Pre visit review using our clinic review tool, if applicable. No additional management support is needed unless otherwise documented below in the visit note. 

## 2016-04-20 NOTE — Telephone Encounter (Signed)
Ok 4:30 Thx 

## 2016-04-20 NOTE — Telephone Encounter (Signed)
I have scheduled patient to come in tomorrow for a rash on her chest.  She would like to know if Dr. Camila Li could work her in today.

## 2016-04-20 NOTE — Patient Instructions (Signed)
Benadryl at night as needed

## 2016-04-20 NOTE — Progress Notes (Signed)
Subjective:  Patient ID: [name], female    DOB: 07/07/1936  Age: 80 y.o. MRN: [id number]  CC: Rash   HPI Alleyna Bolthouse presents for a skin rash on the chest x 2 d w/itching. Denelle ate a soft shell crab. No new meds. C/o some itching on scalp, around ears.  Outpatient Prescriptions Prior to Visit  Medication Sig Dispense Refill  . albuterol (PROVENTIL HFA;VENTOLIN HFA) 108 (90 BASE) MCG/ACT inhaler Inhale 2 puffs into the lungs every 6 (six) hours as needed for wheezing. 1 Inhaler 3  . amoxicillin (AMOXIL) 500 MG capsule Reported on 03/31/2016    . aspirin 81 MG tablet Take 81 mg by mouth daily.      . budesonide-formoterol (SYMBICORT) 80-4.5 MCG/ACT inhaler Inhale 2 puffs into the lungs 2 (two) times daily. 1 Inhaler 5  . Calcium Carbonate-Vitamin D (CALCIUM-VITAMIN D) 500-200 MG-UNIT per tablet Take 3 tablets by mouth daily.     . Cholecalciferol (VITAMIN D) 400 UNITS capsule Take 400 Units by mouth daily.    [name] DEXILANT 60 MG capsule TAKE (1) CAPSULE DAILY. 30 capsule 3  . EPIPEN 2-PAK 0.3 MG/0.3ML SOAJ injection Inject 0.3 mg as directed as needed.     [name] ESTRACE VAGINAL 0.1 MG/GM vaginal cream PLACE 1 APPLICATORFUL VAGINALLY 3 TIMES A WEEK. 42.5 g 3  . fluticasone (FLONASE) 50 MCG/ACT nasal spray USE 2 SPRAYS IN EACH NOSTRIL ONCE DAILY 16 g 0  . ipratropium (ATROVENT) 0.06 % nasal spray Place 1 spray into the nose 3 (three) times daily as needed for rhinitis. 15 mL 0  . loratadine (CLARITIN) 10 MG tablet Take 10 mg by mouth daily.    [name] LORazepam (ATIVAN) 1 MG tablet TAKE 1 OR 2 TABLETS TWICE A DAY AS NEEDED FOR ANXIETY. 100 tablet 3  . montelukast (SINGULAIR) 10 MG tablet Take 10 mg by mouth at bedtime.     . Omega-3 Fatty Acids ([employer] FISH OIL) 1000 MG CAPS Take 1 capsule by mouth daily. 90 capsule   . SALINE NASAL SPRAY NA Place into the nose.    . simvastatin (ZOCOR) 40 MG tablet TAKE 1 TABLET ONCE DAILY. 90 tablet 3  . SYNTHROID 50 MCG tablet TAKE 1 TABLET ONCE DAILY BEFORE  BREAKFAST. 30 tablet 11  . VESICARE 10 MG tablet Take 1 tablet by mouth daily.    [name] omeprazole (PRILOSEC) 40 MG capsule TAKE (1) CAPSULE DAILY. 30 capsule 3   Facility-Administered Medications Prior to Visit  Medication Dose Route Frequency Provider Last Rate Last Dose  . triamcinolone acetonide (KENALOG) 10 MG/ML injection 10 mg  10 mg Other Once [name], [name]        ROS Review of Systems  Constitutional: Negative for chills, activity change, appetite change, fatigue and unexpected weight change.  HENT: Negative for congestion, mouth sores and sinus pressure.   Eyes: Negative for visual disturbance.  Respiratory: Negative for cough and chest tightness.   Gastrointestinal: Negative for nausea and abdominal pain.  Genitourinary: Negative for frequency, difficulty urinating and vaginal pain.  Musculoskeletal: Negative for back pain and gait problem.  Skin: Positive for rash. Negative for pallor.  Neurological: Negative for dizziness, tremors, weakness, numbness and headaches.  Psychiatric/Behavioral: Negative for suicidal ideas, confusion and sleep disturbance.    Objective:  BP 140/70 mmHg  Pulse 75  Temp(Src) 97.4 F (36.3 C) (Oral)  SpO2 97%  BP Readings from Last 3 Encounters:  04/20/16 140/70  03/31/16 118/74  01/28/16 128/74  Wt Readings from Last 3 Encounters:  01/28/16 127 lb (57.607 kg)  02/19/15 124 lb (56.246 kg)  12/04/14 128 lb (58.06 kg)    Physical Exam  Constitutional: She appears well-developed. No distress.  HENT:  Head: Normocephalic.  Right Ear: External ear normal.  Left Ear: External ear normal.  Nose: Nose normal.  Mouth/Throat: Oropharynx is clear and moist.  Eyes: Conjunctivae are normal. Pupils are equal, round, and reactive to light. Right eye exhibits no discharge. Left eye exhibits no discharge.  Neck: Normal range of motion. Neck supple. No JVD present. No tracheal deviation present. No thyromegaly present.  Cardiovascular:  Normal rate, regular rhythm and normal heart sounds.   Pulmonary/Chest: No stridor. No respiratory distress. She has no wheezes.  Abdominal: Soft. Bowel sounds are normal. She exhibits no distension and no mass. There is no tenderness. There is no rebound and no guarding.  Musculoskeletal: She exhibits no edema or tenderness.  Lymphadenopathy:    She has no cervical adenopathy.  Neurological: She displays normal reflexes. No cranial nerve deficit. She exhibits normal muscle tone. Coordination normal.  Skin: Rash noted. There is erythema.  Psychiatric: She has a normal mood and affect. Her behavior is normal. Judgment and thought content normal.  eryth papular rash on V-line ??faint rash on scalp  Lab Results  Component Value Date   WBC 5.3 02/17/2016   HGB 13.6 02/17/2016   HCT 40.0 02/17/2016   PLT 273.0 02/17/2016   GLUCOSE 108* 02/17/2016   CHOL 184 02/17/2016   TRIG 160.0* 02/17/2016   HDL 49.30 02/17/2016   LDLCALC 103* 02/17/2016   ALT 22 02/17/2016   AST 22 02/17/2016   NA 139 02/17/2016   K 4.1 02/17/2016   CL 103 02/17/2016   CREATININE 0.75 02/17/2016   BUN 14 02/17/2016   CO2 28 02/17/2016   TSH 2.13 02/17/2016   HGBA1C 6.0 12/05/2006    Mm Screening Breast Tomo Bilateral  08/06/2015  CLINICAL DATA:  Screening. EXAM: DIGITAL SCREENING BILATERAL MAMMOGRAM WITH 3D TOMO WITH CAD COMPARISON:  Previous exam(s). ACR Breast Density Category b: There are scattered areas of fibroglandular density. FINDINGS: There are no findings suspicious for malignancy. Images were processed with CAD. IMPRESSION: No mammographic evidence of malignancy. A result letter of this screening mammogram will be mailed directly to the patient. RECOMMENDATION: Screening mammogram in one year. (Code:SM-B-01Y) BI-RADS CATEGORY  1: Negative. Electronically Signed   By: Altamese Cabal M.D.   On: 08/06/2015 14:02    Assessment & Plan:   Pauli was seen today for rash.  Diagnoses and all orders for  this visit:  Rash and nonspecific skin eruption -     methylPREDNISolone acetate (DEPO-MEDROL) injection 80 mg; Inject 1 mL (80 mg total) into the muscle once.  Other orders -     halobetasol (ULTRAVATE) 0.05 % cream; Apply topically 2 (two) times daily.   I have discontinued Ms. Hymes's omeprazole. I am also having her start on halobetasol. Additionally, I am having her maintain her aspirin, calcium-vitamin D, SALINE NASAL SPRAY NA, Vitamin D, budesonide-formoterol, albuterol, ipratropium, EPIPEN 2-PAK, montelukast, amoxicillin, CVS FISH OIL, SYNTHROID, loratadine, VESICARE, ESTRACE VAGINAL, simvastatin, LORazepam, fluticasone, and DEXILANT. We administered methylPREDNISolone acetate. We will continue to administer triamcinolone acetonide.  Meds ordered this encounter  Medications  . halobetasol (ULTRAVATE) 0.05 % cream    Sig: Apply topically 2 (two) times daily.    Dispense:  50 g    Refill:  1  . methylPREDNISolone acetate (DEPO-MEDROL) injection  80 mg    Sig:      Follow-up: Return for a follow-up visit.  Walker Kehr, MD

## 2016-04-20 NOTE — Assessment & Plan Note (Addendum)
5/17 Chest and scalp A reaction due to a ?soft shell crab food allergy ?med allergy - ?Dexilant D/c Dexilant if re-occurs Rx: ultravate cream, Benadryl at hs, Claritin Depoedrol IM 80 mg

## 2016-04-20 NOTE — Assessment & Plan Note (Signed)
D/c Dexilant if rash re-occurs Will use something else instead if needed GERD foamwedge

## 2016-04-21 ENCOUNTER — Ambulatory Visit: Payer: Medicare Other | Admitting: Internal Medicine

## 2016-04-21 DIAGNOSIS — L821 Other seborrheic keratosis: Secondary | ICD-10-CM | POA: Diagnosis not present

## 2016-04-21 DIAGNOSIS — L309 Dermatitis, unspecified: Secondary | ICD-10-CM | POA: Diagnosis not present

## 2016-04-21 DIAGNOSIS — L814 Other melanin hyperpigmentation: Secondary | ICD-10-CM | POA: Diagnosis not present

## 2016-04-27 ENCOUNTER — Other Ambulatory Visit: Payer: Self-pay | Admitting: Internal Medicine

## 2016-04-27 NOTE — Telephone Encounter (Signed)
Called refill into pharmacy had to leave msg on pharmacy vm.Marland KitchenJohny [name]

## 2016-04-27 NOTE — Telephone Encounter (Signed)
Pt left msg on vm stating she is completely out of her Lorazepam. Requesting refill ASAP...[name]

## 2016-04-27 NOTE — Telephone Encounter (Signed)
OK to fill this prescription with additional refills x3 Thank you!  

## 2016-04-30 ENCOUNTER — Telehealth: Payer: Self-pay

## 2016-04-30 MED ORDER — AZITHROMYCIN 250 MG PO TABS: ORAL_TABLET | ORAL | Status: DC

## 2016-04-30 NOTE — Telephone Encounter (Signed)
Patient [place] and would like a rx called for a zpack. She states Dr. Camila Li is aware she is going out of the country.

## 2016-04-30 NOTE — Telephone Encounter (Signed)
Done. Thx.

## 2016-05-03 NOTE — Telephone Encounter (Signed)
Left detailed mess informing pt.  

## 2016-05-22 ENCOUNTER — Ambulatory Visit (INDEPENDENT_AMBULATORY_CARE_PROVIDER_SITE_OTHER): Payer: Medicare Other | Admitting: Internal Medicine

## 2016-05-22 ENCOUNTER — Encounter: Payer: Self-pay | Admitting: Internal Medicine

## 2016-05-22 VITALS — BP 120/68 | HR 88 | Temp 98.0°F | Resp 12

## 2016-05-22 DIAGNOSIS — R0989 Other specified symptoms and signs involving the circulatory and respiratory systems: Secondary | ICD-10-CM | POA: Diagnosis not present

## 2016-05-22 DIAGNOSIS — Z8709 Personal history of other diseases of the respiratory system: Secondary | ICD-10-CM

## 2016-05-22 DIAGNOSIS — J988 Other specified respiratory disorders: Secondary | ICD-10-CM | POA: Diagnosis not present

## 2016-05-22 MED ORDER — METHYLPREDNISOLONE ACETATE 40 MG/ML IJ SUSP
40.0000 mg | Freq: Once | INTRAMUSCULAR | Status: AC
  Administered 2016-05-22: 40 mg via INTRAMUSCULAR

## 2016-05-22 MED ORDER — METHYLPREDNISOLONE ACETATE 80 MG/ML IJ SUSP
80.0000 mg | Freq: Once | INTRAMUSCULAR | Status: AC
  Administered 2016-05-22: 80 mg via INTRAMUSCULAR

## 2016-05-22 MED ORDER — BUDESONIDE-FORMOTEROL FUMARATE 80-4.5 MCG/ACT IN AERO: 2.0000 | INHALATION_SPRAY | Freq: Two times a day (BID) | RESPIRATORY_TRACT | Status: DC

## 2016-05-22 NOTE — Progress Notes (Signed)
Pre visit review using our clinic review tool, if applicable. No additional management support is needed unless otherwise documented below in the visit note. 

## 2016-05-22 NOTE — Patient Instructions (Addendum)
Chest exam is reassuring .   Restart the symbicort and take for a month to avoid ashtma flare.  Caution with   Preventive systemic  Steroids  Bone effect side effects .  Suggest begin your controller med  Ever time you get a head or chest cold.  Fu with pcp if  persistent or progressive .

## 2016-05-22 NOTE — Progress Notes (Signed)
Chief Complaint  Patient presents with  . Cough  . Nasal Congestion    HPI: [name] 80 y.o.  Patient comes in today for SDA Saturday clinic for  new problem evaluation.  [place]   [place] ago  Caught / got chest cold on plane  And on chest and sinuses  And  Clear and now yellow    Went on z pack  Per dr Mamie Nick   But     In past    Would get  Asthma  ( dr Velora Heckler)   Used to get depmedrol in past.  To help  w with asthma that she fears will be flaring .   Not on symbicort controller at this time and  Has albuterol at home if needed .  ROS: See pertinent positives and negatives per HPI. No cp sob now no hemoptysis or fever chills   Sinus pain better today .   Past Medical History  Diagnosis Date  . GERD (gastroesophageal reflux disease)   . Osteoporosis 05/2014    T score distal third radius -2.5  Spine, left and right hip all show statistically significant improvement.  . Hyperlipidemia   . OA (osteoarthritis)   . Allergic rhinitis   . Baker's cyst     Left-Dr. Aluisio  . Torn meniscus     bilateral  . MVP (mitral valve prolapse)     Antibiotics required for dental procedures  . Cataract     Dr. Katy Fitch  . Overactive bladder   . Atrophic vaginitis   . Fibroid   . Thyroid nodule   . Heart murmur   . Thyroid disease   . IBS (irritable bowel syndrome)   . Colon polyps   . Gastritis     chronic  . Hemorrhoids     Family History  Problem Relation Age of Onset  . Heart disease Mother   . Hypertension Mother   . Osteoporosis Mother   . Congestive Heart Failure Mother   . Pancreatic cancer Father   . Colon cancer Neg Hx   . Colon polyps Neg Hx   . Rectal cancer Neg Hx   . Stomach cancer Neg Hx   . Heart attack Brother     Social History   Social History  . Marital Status: Married    Spouse Name: N/A  . Number of Children: N/A  . Years of Education: N/A   Occupational History  . retired    Social History Main Topics  . Smoking  status: Former Research scientist (life sciences)  . Smokeless tobacco: Never Used  . Alcohol Use: 3.0 oz/week    5 Standard drinks or equivalent per week     Comment: Socially  . Drug Use: No  . Sexual Activity: Yes    Birth Control/ Protection: Post-menopausal     Comment: 1st intercourse 80 yo-Fewer than 5 partners   Other Topics Concern  . None   Social History Narrative   Regular exercise-yes   Daily caffeine use    Outpatient Prescriptions Prior to Visit  Medication Sig Dispense Refill  . albuterol (PROVENTIL HFA;VENTOLIN HFA) 108 (90 BASE) MCG/ACT inhaler Inhale 2 puffs into the lungs every 6 (six) hours as needed for wheezing. 1 Inhaler 3  . amoxicillin (AMOXIL) 500 MG capsule Prior to dental procedure    . aspirin 81 MG tablet Take 81 mg by mouth daily.      [name] azithromycin (ZITHROMAX) 250 MG tablet As directed  6 tablet 0  . Calcium Carbonate-Vitamin D (CALCIUM-VITAMIN D) 500-200 MG-UNIT per tablet Take 3 tablets by mouth daily.     . Cholecalciferol (VITAMIN D) 400 UNITS capsule Take 400 Units by mouth daily.    [name] DEXILANT 60 MG capsule TAKE (1) CAPSULE DAILY. 30 capsule 3  . EPIPEN 2-PAK 0.3 MG/0.3ML SOAJ injection Inject 0.3 mg as directed as needed.     [name] ESTRACE VAGINAL 0.1 MG/GM vaginal cream PLACE 1 APPLICATORFUL VAGINALLY 3 TIMES A WEEK. 42.5 g 3  . fluticasone (FLONASE) 50 MCG/ACT nasal spray USE 2 SPRAYS IN EACH NOSTRIL ONCE DAILY 16 g 0  . halobetasol (ULTRAVATE) 0.05 % cream Apply topically 2 (two) times daily. 50 g 1  . ipratropium (ATROVENT) 0.06 % nasal spray Place 1 spray into the nose 3 (three) times daily as needed for rhinitis. 15 mL 0  . loratadine (CLARITIN) 10 MG tablet Take 10 mg by mouth daily.    [name] LORazepam (ATIVAN) 1 MG tablet TAKE 1 OR 2 TABLETS TWICE A DAY AS NEEDED FOR ANXIETY. 100 tablet 3  . montelukast (SINGULAIR) 10 MG tablet Take 10 mg by mouth at bedtime.     . Omega-3 Fatty Acids ([employer] FISH OIL) 1000 MG CAPS Take 1 capsule by mouth daily. 90 capsule   . SALINE  NASAL SPRAY NA Place into the nose.    . simvastatin (ZOCOR) 40 MG tablet TAKE 1 TABLET ONCE DAILY. 90 tablet 3  . SYNTHROID 50 MCG tablet TAKE 1 TABLET ONCE DAILY BEFORE BREAKFAST. 30 tablet 11  . VESICARE 10 MG tablet Take 1 tablet by mouth daily.    . budesonide-formoterol (SYMBICORT) 80-4.5 MCG/ACT inhaler Inhale 2 puffs into the lungs 2 (two) times daily. (Patient taking differently: Inhale 2 puffs into the lungs 2 (two) times daily as needed. ) 1 Inhaler 5   Facility-Administered Medications Prior to Visit  Medication Dose Route Frequency Provider Last Rate Last Dose  . triamcinolone acetonide (KENALOG) 10 MG/ML injection 10 mg  10 mg Other Once [employer], [name]         EXAM:  BP 120/68 mmHg  Pulse 88  Temp(Src) 98 F (36.7 C) (Oral)  Resp 12  Wt   SpO2 96%  There is no weight on file to calculate BMI.  GENERAL: vitals reviewed and listed above, alert, oriented, appears well hydrated and in no acute distress mid congstion no resp disress and non toxic  HEENT: atraumatic, conjunctiva  clear, no obvious abnormalities on inspection of external nose and ears tms clear face non tender  OP : no lesion edema or exudate  Cobblestoning noted  NECK: no obvious masses on inspection palpation  LUNGS: clear to auscultation bilaterally, no wheezes, rales or rhonchi,  CV: HRRR, no clubbing cyanosis or  peripheral edema nl cap refill  MS: moves all extremities without noticeable focal  abnormality PSYCH: pleasant and cooperative, no obvious depression or anxiety  ASSESSMENT AND PLAN:  Discussed the following assessment and plan:  Chest congestion - Plan: methylPREDNISolone acetate (DEPO-MEDROL) injection 80 mg, methylPREDNISolone acetate (DEPO-MEDROL) injection 40 mg  RTI (respiratory tract infection)  Hx of extrinsic asthma Hx of asthma  Advised prefer to use preventive  Injection and disc  Prefer to use pred and inhalers but will proceed  As patient request  With  Disc  But  future should be on preventive inhalers at onset of any trigger to avoid  Metabolic se of systemic steroids if possible.  Pt understands  Plan  Exam  Is good today . Has risk  -Patient advised to return or notify health care team  if symptoms worsen ,persist or new concerns arise.  Patient Instructions  Chest exam is reassuring .   Restart the symbicort and take for a month to avoid ashtma flare.  Caution with   Preventive systemic  Steroids  Bone effect side effects .  Suggest begin your controller med  Ever time you get a head or chest cold.  Fu with pcp if  persistent or progressive      Mariann Laster K. Panosh M.D.

## 2016-05-27 ENCOUNTER — Telehealth: Payer: Self-pay | Admitting: *Deleted

## 2016-05-27 NOTE — Telephone Encounter (Signed)
Pt aware of normal pap smear results from OV 03/31/16

## 2016-06-08 ENCOUNTER — Ambulatory Visit (INDEPENDENT_AMBULATORY_CARE_PROVIDER_SITE_OTHER): Payer: Medicare Other

## 2016-06-08 DIAGNOSIS — M810 Age-related osteoporosis without current pathological fracture: Secondary | ICD-10-CM | POA: Diagnosis not present

## 2016-06-09 ENCOUNTER — Telehealth: Payer: Self-pay | Admitting: Gynecology

## 2016-06-09 NOTE — Telephone Encounter (Signed)
Pt informed with the below note, will schedule OV to discuss.

## 2016-06-09 NOTE — Telephone Encounter (Signed)
Tell patient there has been some bone loss from her recent DEXA in the hip area. Question is whether we should reinitiate treatment. Recommend office visit to discuss.

## 2016-06-09 NOTE — Telephone Encounter (Signed)
Left message for pt to call.

## 2016-06-11 ENCOUNTER — Ambulatory Visit (INDEPENDENT_AMBULATORY_CARE_PROVIDER_SITE_OTHER): Payer: Medicare Other | Admitting: Gynecology

## 2016-06-11 ENCOUNTER — Encounter: Payer: Self-pay | Admitting: Gynecology

## 2016-06-11 VITALS — BP 118/72

## 2016-06-11 DIAGNOSIS — M810 Age-related osteoporosis without current pathological fracture: Secondary | ICD-10-CM

## 2016-06-11 MED ORDER — RISEDRONATE SODIUM 150 MG PO TABS: 150.0000 mg | ORAL_TABLET | ORAL | Status: DC

## 2016-06-11 NOTE — Patient Instructions (Signed)
Calcium Carbonate; Risedronate tablets What is this medicine? CALCIUM CARBONATE; RISEDRONATE (KAL see um KAR bon ate; ris ED roe nate) reduces calcium loss from bones. It is used to prevent and to treat osteoporosis. This medicine may be used for other purposes; ask your health care provider or pharmacist if you have questions. What should I tell my health care provider before I take this medicine? They need to know if you have any of these conditions: -dental disease -esophageal, stomach, or intestinal problems, like acid reflux or GERD -hyperparathyroidism -kidney stones or disease -low or high blood calcium -problems sitting or standing for 30 minutes -trouble swallowing -an unusual or allergic reaction to calcium, risedronate, other bisphosphonates or medicines, foods, dyes, or preservatives -pregnant or trying to get pregnant -breast-feeding How should I use this medicine? Follow the directions on the prescription label. Take risedronate on the same day of the week, every week. Take calcium on the other 6 days of the week. On the day you take the risedronate: You must take this medicine exactly as directly or you will lower the amount of the medicine that you absorb into your body or you may cause yourself harm. Take this medicine by mouth first thing in the morning, after you are up for the day. Do not eat or drink anything before you take this medicine. Swallow the tablet with a full glass (6 to 8 ounces) of plain water. Do not take this medicine with any other drink. Do not chew or crush the tablet. After taking this medicine, do not eat breakfast, drink, or take any other medicines or vitamins for at least 30 minutes. Stand or sit up for at least 30 minutes after you take this medicine; do not lie down. Take this medicine on the same day every week. Do not take your medicine more often than directed. On the days you take the calcium: Take your calcium tablet with food on the 6 days of  the week that you do not take risedronate. Do not take other medicines at the same time as your calcium. Talk to your pediatrician regarding the use of this medicine in children. Special care may be needed. Overdosage: If you think you have taken too much of this medicine contact a poison control center or emergency room at once. NOTE: This medicine is only for you. Do not share this medicine with others. What if I miss a dose? If you miss a dose of risedronate, take the dose on the morning after you remember. Then take your next dose on your regular scheduled day of the week. Never take 2 tablets on the same day. Do not take double or extra doses. If you miss a dose of calcium, take it as soon as you remember with your next meal. Do not take 2 tablets with the same meal. Do not take calcium and risedronate tablets at the same time. What may interact with this medicine? Do not take this medicine with any of the following medications: -ammonium chloride -methenamine This medicine may also interact with the following medications: -antacids like aluminum hydroxide or magnesium hydroxide -antibiotics like ciprofloxacin, tetracycline and others -aspirin -calcium supplements -drugs for inflammation like ibuprofen, naproxen, and others -iron supplements -magnesium supplements -NSAIDs, medicines for pain and inflammation, like ibuprofen or naproxen -steroid medicines like prednisone or cortisone -thiazide diuretics -thyroid hormones -vitamins with minerals This list may not describe all possible interactions. Give your health care provider a list of all the medicines, herbs, non-prescription drugs, or  dietary supplements you use. Also tell them if you smoke, drink alcohol, or use illegal drugs. Some items may interact with your medicine. What should I watch for while using this medicine? Visit your doctor or health care professional for regular check ups. It may be some time before you see the  benefit from this medicine. Your doctor may order blood tests or other tests to see how you are doing. You should make sure that you get enough calcium and vitamin D while you are taking this medicine. Discuss the foods you eat and the vitamins you take with your health care professional. Some people who take this medicine have severe bone, joint, and/or muscle pain. This medicine may also increase your risk for a broken thigh bone. Tell your doctor right away if you have pain in your upper leg or groin. Tell your doctor if you have any pain that does not go away or that gets worse. What side effects may I notice from receiving this medicine? Side effects that you should report to your doctor as soon as possible: -allergic reactions like skin rash, itching or hives, swelling of the face, lips, or tongue -breathing problems -black or tarry stools -changes in vision -heartburn or stomach pain -jaw pain, especially after dental work -pain or difficulty when swallowing -redness, blistering, peeling, or loosening of the skin, including inside the mouth -unusually weak or tired Side effects that usually do not require medical attention (report to your doctor if they continue or are bothersome): -bone, muscle, or joint pain -changes in taste -diarrhea or constipation -eye pain or itching -headache -nausea or vomiting -stomach gas or fullness This list may not describe all possible side effects. Call your doctor for medical advice about side effects. You may report side effects to FDA at 1-800-FDA-1088. Where should I keep my medicine? Keep out of the reach of children. Store at room temperature between 15 and 30 degrees C (59 and 86 degrees F). Throw away any unused medicine after the expiration date. NOTE: This sheet is a summary. It may not cover all possible information. If you have questions about this medicine, talk to your doctor, pharmacist, or health care provider.    2016,  Elsevier/Gold Standard. (2011-05-17 09:47:05)

## 2016-06-11 NOTE — Progress Notes (Signed)
    [name] [birth year] [id number]        79 y.o.  [id number] presents for discussion with most recent bone density showing osteoporosis. T score -3 forearm, -2.5 right femoral neck, -2.2 left femoral neck. Overall stable from prior exam excepting the femoral necks where 2015 T score -2.0 right femoral neck 2.1 left femoral neck.  Does have a history of Fosamax use a number of years ago but has been off of it for at least 8 years.  Past medical history,surgical history, problem list, medications, allergies, family history and social history were all reviewed and documented in the EPIC chart.  Directed ROS with pertinent positives and negatives documented in the history of present illness/assessment and plan.  Exam: Filed Vitals:   06/11/16 0925  BP: 118/72   General appearance:  Normal   Assessment/Plan:  80 y.o. [id number] Patient and I reviewed her situation. When looking back at 2011 her right femoral neck T score was -2.0. Her left femoral neck was -2.1. She seems to have had a decline in her right femoral neck. She also has a family history of osteoporosis with fracture and her mother. The question is whether to treat now or not based on slight loss of bone over time. I reviewed treatment options with her to include Fosamax, Actonel, Boniva, Prolia. Risks and benefits to include GERD, osteonecrosis of the jaw, atypical fractures, rashes and infections. The issue of prolonged use in that she has used bisphosphonates previously although she has been off of it for a number of years whether this would increase the risk of atypical fractures also reviewed. Options for observation with recheck of the bone density in 2 years excepting the risk of progressive bone loss and high risk of fracture discussed. Ultimately we both agree to start a trial of Actonel 150 mg monthly. How to take the medication and side effects reviewed. Patient will call if she has any issues with this. Otherwise we'll plan on follow  up density in 2 years. She is unsure when her last vitamin D level was checked we'll go ahead and check a vitamin D level today.  Greater than 50% of my time was spent in direct face to face counseling and coordination of care with the patient.   Anastasio Auerbach MD, 9:43 AM 06/11/2016

## 2016-06-14 ENCOUNTER — Other Ambulatory Visit: Payer: Medicare Other

## 2016-06-14 ENCOUNTER — Telehealth: Payer: Self-pay | Admitting: *Deleted

## 2016-06-14 NOTE — Telephone Encounter (Signed)
Pt called stating Actonel is not covered by her insurance company,i advised pt to see if there is a form that could be filled out, pt will check and call back.

## 2016-06-15 NOTE — Telephone Encounter (Signed)
Prolia ok

## 2016-06-15 NOTE — Telephone Encounter (Signed)
Will route to Pam to start

## 2016-06-15 NOTE — Telephone Encounter (Signed)
Dr.Fontaine pt called and asked if she could start prolia injections appears this is covered by her insurance. Please advise

## 2016-06-16 ENCOUNTER — Telehealth: Payer: Self-pay | Admitting: Gynecology

## 2016-06-16 DIAGNOSIS — H01022 Squamous blepharitis right lower eyelid: Secondary | ICD-10-CM | POA: Diagnosis not present

## 2016-06-16 DIAGNOSIS — Z961 Presence of intraocular lens: Secondary | ICD-10-CM | POA: Diagnosis not present

## 2016-06-16 DIAGNOSIS — H01024 Squamous blepharitis left upper eyelid: Secondary | ICD-10-CM | POA: Diagnosis not present

## 2016-06-16 DIAGNOSIS — H01025 Squamous blepharitis left lower eyelid: Secondary | ICD-10-CM | POA: Diagnosis not present

## 2016-06-16 DIAGNOSIS — H532 Diplopia: Secondary | ICD-10-CM | POA: Diagnosis not present

## 2016-06-16 DIAGNOSIS — H01021 Squamous blepharitis right upper eyelid: Secondary | ICD-10-CM | POA: Diagnosis not present

## 2016-06-16 DIAGNOSIS — H10413 Chronic giant papillary conjunctivitis, bilateral: Secondary | ICD-10-CM | POA: Diagnosis not present

## 2016-06-16 DIAGNOSIS — H04123 Dry eye syndrome of bilateral lacrimal glands: Secondary | ICD-10-CM | POA: Diagnosis not present

## 2016-06-16 NOTE — Telephone Encounter (Signed)
PC from pt to [name], I returned PC . Pt would like for me to check benefits for Prolia injection. She is interested in Prolia in place of oral medication. I explained injection every 6 months for 5 years is protocol. Calcium level 9.3 on 02/17/16. Past history of dental implants she will check with dentist to inform him in regards to injection. I told her I would let Dr Phineas Real know about this phone call for his approval.

## 2016-06-17 ENCOUNTER — Other Ambulatory Visit: Payer: Medicare Other

## 2016-06-17 DIAGNOSIS — M810 Age-related osteoporosis without current pathological fracture: Secondary | ICD-10-CM | POA: Diagnosis not present

## 2016-06-17 NOTE — Telephone Encounter (Signed)
okay

## 2016-06-18 LAB — VITAMIN D 25 HYDROXY (VIT D DEFICIENCY, FRACTURES): Vit D, 25-Hydroxy: 31 ng/mL (ref 30–100)

## 2016-06-22 ENCOUNTER — Other Ambulatory Visit: Payer: Self-pay | Admitting: Internal Medicine

## 2016-06-23 DIAGNOSIS — J3089 Other allergic rhinitis: Secondary | ICD-10-CM | POA: Diagnosis not present

## 2016-06-23 DIAGNOSIS — T63441A Toxic effect of venom of bees, accidental (unintentional), initial encounter: Secondary | ICD-10-CM | POA: Diagnosis not present

## 2016-06-23 DIAGNOSIS — J4520 Mild intermittent asthma, uncomplicated: Secondary | ICD-10-CM | POA: Diagnosis not present

## 2016-06-23 DIAGNOSIS — J0110 Acute frontal sinusitis, unspecified: Secondary | ICD-10-CM | POA: Diagnosis not present

## 2016-06-30 ENCOUNTER — Encounter: Payer: Self-pay | Admitting: Internal Medicine

## 2016-06-30 DIAGNOSIS — D225 Melanocytic nevi of trunk: Secondary | ICD-10-CM | POA: Diagnosis not present

## 2016-06-30 DIAGNOSIS — Z85828 Personal history of other malignant neoplasm of skin: Secondary | ICD-10-CM | POA: Diagnosis not present

## 2016-06-30 DIAGNOSIS — Z411 Encounter for cosmetic surgery: Secondary | ICD-10-CM | POA: Diagnosis not present

## 2016-06-30 DIAGNOSIS — L821 Other seborrheic keratosis: Secondary | ICD-10-CM | POA: Diagnosis not present

## 2016-06-30 DIAGNOSIS — L299 Pruritus, unspecified: Secondary | ICD-10-CM | POA: Diagnosis not present

## 2016-06-30 DIAGNOSIS — L719 Rosacea, unspecified: Secondary | ICD-10-CM | POA: Diagnosis not present

## 2016-07-01 ENCOUNTER — Other Ambulatory Visit: Payer: Self-pay | Admitting: Internal Medicine

## 2016-07-01 DIAGNOSIS — R7309 Other abnormal glucose: Secondary | ICD-10-CM

## 2016-07-01 DIAGNOSIS — E785 Hyperlipidemia, unspecified: Secondary | ICD-10-CM

## 2016-07-05 NOTE — Telephone Encounter (Signed)
Insurance information received Deductible [id number] (met). 20% co insurance $420. Secondary coverage 100% , cost to pt $0. Calcium level 9.3 02/17/16. Pt will check with dentist due to hx implants and call back to schedule.

## 2016-07-07 ENCOUNTER — Encounter: Payer: Self-pay | Admitting: Internal Medicine

## 2016-07-07 ENCOUNTER — Ambulatory Visit (INDEPENDENT_AMBULATORY_CARE_PROVIDER_SITE_OTHER): Payer: Medicare Other | Admitting: Internal Medicine

## 2016-07-07 DIAGNOSIS — K219 Gastro-esophageal reflux disease without esophagitis: Secondary | ICD-10-CM

## 2016-07-07 DIAGNOSIS — R142 Eructation: Secondary | ICD-10-CM

## 2016-07-07 DIAGNOSIS — R059 Cough, unspecified: Secondary | ICD-10-CM

## 2016-07-07 DIAGNOSIS — R05 Cough: Secondary | ICD-10-CM

## 2016-07-07 DIAGNOSIS — I10 Essential (primary) hypertension: Secondary | ICD-10-CM | POA: Diagnosis not present

## 2016-07-07 DIAGNOSIS — E782 Mixed hyperlipidemia: Secondary | ICD-10-CM | POA: Diagnosis not present

## 2016-07-07 DIAGNOSIS — R143 Flatulence: Secondary | ICD-10-CM

## 2016-07-07 DIAGNOSIS — J309 Allergic rhinitis, unspecified: Secondary | ICD-10-CM

## 2016-07-07 DIAGNOSIS — R141 Gas pain: Secondary | ICD-10-CM

## 2016-07-07 NOTE — Patient Instructions (Signed)
GERD wedge/body pillow

## 2016-07-07 NOTE — Assessment & Plan Note (Signed)
Hemoccult tests Hold fish oil

## 2016-07-07 NOTE — Progress Notes (Signed)
Subjective:  Patient ID: [name], female    DOB: [birth year]  Age: 80 y.o. MRN: [id number]  CC: No chief complaint on file.   HPI [name] presents for dyslipidemia, GERD, HTN, allergies f/u [name] wants to start Prolia w/her GYN. C/o loose stools C/o chronic sinus congestion and thick sputum  Outpatient Medications Prior to Visit  Medication Sig Dispense Refill  . albuterol (PROVENTIL HFA;VENTOLIN HFA) 108 (90 BASE) MCG/ACT inhaler Inhale 2 puffs into the lungs every 6 (six) hours as needed for wheezing. 1 Inhaler 3  . amoxicillin (AMOXIL) 500 MG capsule Reported on 06/11/2016    . aspirin 81 MG tablet Take 81 mg by mouth daily.      . budesonide-formoterol (SYMBICORT) 80-4.5 MCG/ACT inhaler Inhale 2 puffs into the lungs 2 (two) times daily. 1 Inhaler 0  . Calcium Carbonate-Vitamin D (CALCIUM-VITAMIN D) 500-200 MG-UNIT per tablet Take 3 tablets by mouth daily.     . Cholecalciferol (VITAMIN D) 400 UNITS capsule Take 400 Units by mouth daily.    [name] DEXILANT 60 MG capsule TAKE (1) CAPSULE DAILY. 30 capsule 3  . EPIPEN 2-PAK 0.3 MG/0.3ML SOAJ injection Inject 0.3 mg as directed as needed.     [name] ESTRACE VAGINAL 0.1 MG/GM vaginal cream PLACE 1 APPLICATORFUL VAGINALLY 3 TIMES A WEEK. 42.5 g 3  . fluticasone (FLONASE) 50 MCG/ACT nasal spray USE 2 SPRAYS IN EACH NOSTRIL ONCE DAILY 16 g 0  . halobetasol (ULTRAVATE) 0.05 % cream Apply topically 2 (two) times daily. 50 g 1  . ipratropium (ATROVENT) 0.06 % nasal spray Place 1 spray into the nose 3 (three) times daily as needed for rhinitis. 15 mL 0  . loratadine (CLARITIN) 10 MG tablet Take 10 mg by mouth daily.    [name] LORazepam (ATIVAN) 1 MG tablet TAKE 1 OR 2 TABLETS TWICE A DAY AS NEEDED FOR ANXIETY. 100 tablet 3  . montelukast (SINGULAIR) 10 MG tablet Take 10 mg by mouth at bedtime.     . Omega-3 Fatty Acids ([employer] FISH OIL) 1000 MG CAPS Take 1 capsule by mouth daily. 90 capsule   . risedronate (ACTONEL) 150 MG tablet Take 1 tablet (150 mg  total) by mouth every 30 (thirty) days. with water on empty stomach, nothing by mouth or lie down for next 30 minutes. 1 tablet 12  . SALINE NASAL SPRAY NA Place into the nose.    . simvastatin (ZOCOR) 40 MG tablet TAKE 1 TABLET ONCE DAILY. 90 tablet 3  . SYNTHROID 50 MCG tablet TAKE 1 TABLET ONCE DAILY BEFORE BREAKFAST. 30 tablet 11  . VESICARE 10 MG tablet Take 1 tablet by mouth daily.     Facility-Administered Medications Prior to Visit  Medication Dose Route Frequency Provider Last Rate Last Dose  . triamcinolone acetonide (KENALOG) 10 MG/ML injection 10 mg  10 mg Other Once [name], [name]        ROS Review of Systems  Constitutional: Negative for activity change, appetite change, chills, fatigue and unexpected weight change.  HENT: Negative for congestion, mouth sores and sinus pressure.   Eyes: Negative for visual disturbance.  Respiratory: Negative for cough and chest tightness.   Gastrointestinal: Negative for abdominal pain and nausea.  Genitourinary: Negative for difficulty urinating, frequency and vaginal pain.  Musculoskeletal: Negative for back pain and gait problem.  Skin: Negative for pallor and rash.  Neurological: Negative for dizziness, tremors, weakness, numbness and headaches.  Psychiatric/Behavioral: Negative for confusion and sleep disturbance.  GERD  Objective:  BP 128/76   Pulse 83   SpO2 96%   BP Readings from Last 3 Encounters:  07/07/16 128/76  06/11/16 118/72  05/22/16 120/68    Wt Readings from Last 3 Encounters:  01/28/16 127 lb (57.6 kg)  02/19/15 124 lb (56.2 kg)  12/04/14 128 lb (58.1 kg)    Physical Exam  Constitutional: She appears well-developed. No distress.  HENT:  Head: Normocephalic.  Right Ear: External ear normal.  Left Ear: External ear normal.  Nose: Nose normal.  Mouth/Throat: Oropharynx is clear and moist.  Eyes: Conjunctivae are normal. Pupils are equal, round, and reactive to light. Right eye exhibits no discharge.  Left eye exhibits no discharge.  Neck: Normal range of motion. Neck supple. No JVD present. No tracheal deviation present. No thyromegaly present.  Cardiovascular: Normal rate, regular rhythm and normal heart sounds.   Pulmonary/Chest: No stridor. No respiratory distress. She has no wheezes.  Abdominal: Soft. Bowel sounds are normal. She exhibits no distension and no mass. There is no tenderness. There is no rebound and no guarding.  Musculoskeletal: She exhibits no edema or tenderness.  Lymphadenopathy:    She has no cervical adenopathy.  Neurological: She displays normal reflexes. No cranial nerve deficit. She exhibits normal muscle tone. Coordination normal.  Skin: No rash noted. No erythema.  Psychiatric: She has a normal mood and affect. Her behavior is normal. Judgment and thought content normal.    Lab Results  Component Value Date   WBC 5.3 02/17/2016   HGB 13.6 02/17/2016   HCT 40.0 02/17/2016   PLT 273.0 02/17/2016   GLUCOSE 108 (H) 02/17/2016   CHOL 184 02/17/2016   TRIG 160.0 (H) 02/17/2016   HDL 49.30 02/17/2016   LDLCALC 103 (H) 02/17/2016   ALT 22 02/17/2016   AST 22 02/17/2016   NA 139 02/17/2016   K 4.1 02/17/2016   CL 103 02/17/2016   CREATININE 0.75 02/17/2016   BUN 14 02/17/2016   CO2 28 02/17/2016   TSH 2.13 02/17/2016   HGBA1C 6.0 12/05/2006    Mm Screening Breast Tomo Bilateral  Result Date: 08/06/2015 CLINICAL DATA:  Screening. EXAM: DIGITAL SCREENING BILATERAL MAMMOGRAM WITH 3D TOMO WITH CAD COMPARISON:  Previous exam(s). ACR Breast Density Category b: There are scattered areas of fibroglandular density. FINDINGS: There are no findings suspicious for malignancy. Images were processed with CAD. IMPRESSION: No mammographic evidence of malignancy. A result letter of this screening mammogram will be mailed directly to the patient. RECOMMENDATION: Screening mammogram in one year. (Code:SM-B-01Y) BI-RADS CATEGORY  1: Negative. Electronically Signed   By:  Altamese Cabal M.D.   On: 08/06/2015 14:02    Assessment & Plan:   There are no diagnoses linked to this encounter. I am having [name] maintain her aspirin, calcium-vitamin D, SALINE NASAL SPRAY NA, Vitamin D, albuterol, ipratropium, EPIPEN 2-PAK, montelukast, amoxicillin, [employer] FISH OIL, SYNTHROID, loratadine, VESICARE, ESTRACE VAGINAL, simvastatin, fluticasone, halobetasol, LORazepam, budesonide-formoterol, risedronate, and DEXILANT. We will continue to administer triamcinolone acetonide.  No orders of the defined types were placed in this encounter.    Follow-up: No Follow-up on file.  Walker Kehr, MD

## 2016-07-07 NOTE — Assessment & Plan Note (Signed)
NAS diet 

## 2016-07-07 NOTE — Assessment & Plan Note (Signed)
Dexilant  Potential benefits of a long term PPI  use as well as potential risks  and complications were explained to the patient and were aknowledged. GERD wedge 

## 2016-07-07 NOTE — Progress Notes (Signed)
Pre visit review using our clinic review tool, if applicable. No additional management support is needed unless otherwise documented below in the visit note. 

## 2016-07-07 NOTE — Assessment & Plan Note (Signed)
Labs

## 2016-07-08 ENCOUNTER — Other Ambulatory Visit (INDEPENDENT_AMBULATORY_CARE_PROVIDER_SITE_OTHER): Payer: Medicare Other

## 2016-07-08 ENCOUNTER — Ambulatory Visit: Payer: Medicare Other | Admitting: Internal Medicine

## 2016-07-08 DIAGNOSIS — R7309 Other abnormal glucose: Secondary | ICD-10-CM

## 2016-07-08 DIAGNOSIS — E785 Hyperlipidemia, unspecified: Secondary | ICD-10-CM | POA: Diagnosis not present

## 2016-07-08 LAB — BASIC METABOLIC PANEL
BUN: 16 mg/dL (ref 6–23)
CO2: 29 mEq/L (ref 19–32)
Calcium: 9.1 mg/dL (ref 8.4–10.5)
Chloride: 103 mEq/L (ref 96–112)
Creatinine, Ser: 0.68 mg/dL (ref 0.40–1.20)
GFR: 88.94 mL/min (ref >60.00)
Glucose, Bld: 97 mg/dL (ref 70–99)
Potassium: 4.2 mEq/L (ref 3.5–5.1)
Sodium: 139 mEq/L (ref 135–145)

## 2016-07-08 LAB — TSH: TSH: 1.73 u[IU]/mL (ref 0.35–4.50)

## 2016-07-08 LAB — HEPATIC FUNCTION PANEL
ALT: 42 U/L — ABNORMAL HIGH (ref 0–35)
AST: 23 U/L (ref 0–37)
Albumin: 4.1 g/dL (ref 3.5–5.2)
Alkaline Phosphatase: 39 U/L (ref 39–117)
Bilirubin, Direct: 0.1 mg/dL (ref 0.0–0.3)
Total Bilirubin: 0.6 mg/dL (ref 0.2–1.2)
Total Protein: 7.3 g/dL (ref 6.0–8.3)

## 2016-07-08 LAB — LIPID PANEL
Cholesterol: 220 mg/dL — ABNORMAL HIGH (ref 0–200)
HDL: 63.6 mg/dL (ref >39.00)
LDL Cholesterol: 130 mg/dL — ABNORMAL HIGH (ref 0–99)
NonHDL: 156.7
Total CHOL/HDL Ratio: 3
Triglycerides: 136 mg/dL (ref 0.0–149.0)
VLDL: 27.2 mg/dL (ref 0.0–40.0)

## 2016-07-08 LAB — HEMOGLOBIN A1C: Hgb A1c MFr Bld: 6 % (ref 4.6–6.5)

## 2016-07-08 NOTE — Assessment & Plan Note (Signed)
Flonase, Atrovent nasal, Singulair, Claritin 

## 2016-07-08 NOTE — Assessment & Plan Note (Signed)
GERD wedge Netty pot

## 2016-07-09 ENCOUNTER — Encounter: Payer: Self-pay | Admitting: Internal Medicine

## 2016-07-12 ENCOUNTER — Encounter: Payer: Self-pay | Admitting: Internal Medicine

## 2016-07-20 ENCOUNTER — Encounter: Payer: Self-pay | Admitting: Gynecology

## 2016-07-20 NOTE — Telephone Encounter (Signed)
PC to schedule she has talked to dentist he okd for her Prolia due to hx implants, scheduled her for  07/28/16 at 10am Nurse only. $0 cost to pt

## 2016-07-26 ENCOUNTER — Other Ambulatory Visit: Payer: Self-pay | Admitting: Internal Medicine

## 2016-07-26 DIAGNOSIS — Z1231 Encounter for screening mammogram for malignant neoplasm of breast: Secondary | ICD-10-CM

## 2016-07-28 ENCOUNTER — Other Ambulatory Visit: Payer: Medicare Other

## 2016-07-28 ENCOUNTER — Ambulatory Visit: Payer: Medicare Other

## 2016-07-29 ENCOUNTER — Telehealth: Payer: Self-pay | Admitting: *Deleted

## 2016-07-29 DIAGNOSIS — R142 Eructation: Principal | ICD-10-CM

## 2016-07-29 DIAGNOSIS — R141 Gas pain: Secondary | ICD-10-CM

## 2016-07-29 DIAGNOSIS — R143 Flatulence: Principal | ICD-10-CM

## 2016-07-29 NOTE — Telephone Encounter (Signed)
[name] in lab requesting orders for hemoccult cards.  Lab order placed.

## 2016-07-30 ENCOUNTER — Other Ambulatory Visit (INDEPENDENT_AMBULATORY_CARE_PROVIDER_SITE_OTHER): Payer: Medicare Other

## 2016-07-30 DIAGNOSIS — R141 Gas pain: Secondary | ICD-10-CM | POA: Diagnosis not present

## 2016-07-30 DIAGNOSIS — R143 Flatulence: Secondary | ICD-10-CM | POA: Diagnosis not present

## 2016-07-30 DIAGNOSIS — R142 Eructation: Secondary | ICD-10-CM

## 2016-07-30 LAB — HEMOCCULT SLIDES (X 3 CARDS)
Fecal Occult Blood: NEGATIVE
OCCULT 1: NEGATIVE
OCCULT 2: NEGATIVE
OCCULT 3: NEGATIVE
OCCULT 4: NEGATIVE
OCCULT 5: NEGATIVE

## 2016-08-03 ENCOUNTER — Other Ambulatory Visit: Payer: Self-pay

## 2016-08-03 NOTE — Telephone Encounter (Signed)
Pt r/s  Prolia to 08/04/16 due to sickness per Dr Toney Rakes

## 2016-08-04 ENCOUNTER — Ambulatory Visit (INDEPENDENT_AMBULATORY_CARE_PROVIDER_SITE_OTHER): Payer: Medicare Other | Admitting: Gynecology

## 2016-08-04 ENCOUNTER — Other Ambulatory Visit: Payer: Self-pay | Admitting: Internal Medicine

## 2016-08-04 DIAGNOSIS — M810 Age-related osteoporosis without current pathological fracture: Secondary | ICD-10-CM | POA: Diagnosis not present

## 2016-08-04 MED ORDER — DENOSUMAB 60 MG/ML ~~LOC~~ SOLN
60.0000 mg | Freq: Once | SUBCUTANEOUS | Status: AC
  Administered 2016-08-04: 60 mg via SUBCUTANEOUS

## 2016-08-04 NOTE — Telephone Encounter (Signed)
PROLIA GIVEN 08/04/16   NEXT INJECTION DUE AFTER 02/02/17

## 2016-08-06 ENCOUNTER — Ambulatory Visit (INDEPENDENT_AMBULATORY_CARE_PROVIDER_SITE_OTHER): Payer: Medicare Other | Admitting: Internal Medicine

## 2016-08-06 ENCOUNTER — Encounter: Payer: Self-pay | Admitting: Internal Medicine

## 2016-08-06 VITALS — BP 130/80 | HR 79 | Temp 97.8°F

## 2016-08-06 DIAGNOSIS — E038 Other specified hypothyroidism: Secondary | ICD-10-CM

## 2016-08-06 DIAGNOSIS — R5383 Other fatigue: Secondary | ICD-10-CM | POA: Diagnosis not present

## 2016-08-06 DIAGNOSIS — E034 Atrophy of thyroid (acquired): Secondary | ICD-10-CM

## 2016-08-06 DIAGNOSIS — E042 Nontoxic multinodular goiter: Secondary | ICD-10-CM | POA: Diagnosis not present

## 2016-08-06 DIAGNOSIS — Z23 Encounter for immunization: Secondary | ICD-10-CM | POA: Diagnosis not present

## 2016-08-06 DIAGNOSIS — J320 Chronic maxillary sinusitis: Secondary | ICD-10-CM

## 2016-08-06 DIAGNOSIS — J329 Chronic sinusitis, unspecified: Secondary | ICD-10-CM

## 2016-08-06 DIAGNOSIS — J309 Allergic rhinitis, unspecified: Secondary | ICD-10-CM

## 2016-08-06 HISTORY — DX: Chronic sinusitis, unspecified: J32.9

## 2016-08-06 MED ORDER — DENOSUMAB 60 MG/ML ~~LOC~~ SOLN: 60.0000 mg | Freq: Once | SUBCUTANEOUS | 0 refills | Status: AC

## 2016-08-06 NOTE — Progress Notes (Signed)
Pre visit review using our clinic review tool, if applicable. No additional management support is needed unless otherwise documented below in the visit note. 

## 2016-08-06 NOTE — Assessment & Plan Note (Signed)
Chronic. 

## 2016-08-06 NOTE — Assessment & Plan Note (Signed)
On Levothroid 

## 2016-08-06 NOTE — Progress Notes (Signed)
Subjective:  Patient ID: [name], female    DOB: 09/11/1936  Age: 80 y.o. MRN: [id number]  CC: No chief complaint on file.   HPI [name] presents for fatigue, swelling of the face, sinus congestion  Outpatient Medications Prior to Visit  Medication Sig Dispense Refill  . albuterol (PROVENTIL HFA;VENTOLIN HFA) 108 (90 BASE) MCG/ACT inhaler Inhale 2 puffs into the lungs every 6 (six) hours as needed for wheezing. 1 Inhaler 3  . amoxicillin (AMOXIL) 500 MG capsule Reported on 06/11/2016    . aspirin 81 MG tablet Take 81 mg by mouth daily.      . budesonide-formoterol (SYMBICORT) 80-4.5 MCG/ACT inhaler Inhale 2 puffs into the lungs 2 (two) times daily. 1 Inhaler 0  . Calcium Carbonate-Vitamin D (CALCIUM-VITAMIN D) 500-200 MG-UNIT per tablet Take 3 tablets by mouth daily.     . Cholecalciferol (VITAMIN D) 400 UNITS capsule Take 400 Units by mouth daily.    [name] DEXILANT 60 MG capsule TAKE (1) CAPSULE DAILY. 30 capsule 3  . EPIPEN 2-PAK 0.3 MG/0.3ML SOAJ injection Inject 0.3 mg as directed as needed.     [name] ESTRACE VAGINAL 0.1 MG/GM vaginal cream PLACE 1 APPLICATORFUL VAGINALLY 3 TIMES A WEEK. 42.5 g 3  . fluticasone (FLONASE) 50 MCG/ACT nasal spray USE 2 SPRAYS IN EACH NOSTRIL ONCE DAILY 16 g 0  . halobetasol (ULTRAVATE) 0.05 % cream Apply topically 2 (two) times daily. 50 g 1  . ipratropium (ATROVENT) 0.06 % nasal spray Place 1 spray into the nose 3 (three) times daily as needed for rhinitis. 15 mL 0  . loratadine (CLARITIN) 10 MG tablet Take 10 mg by mouth daily.    [name] LORazepam (ATIVAN) 1 MG tablet TAKE 1 OR 2 TABLETS TWICE A DAY AS NEEDED FOR ANXIETY. 100 tablet 3  . montelukast (SINGULAIR) 10 MG tablet Take 10 mg by mouth at bedtime.     . Omega-3 Fatty Acids ([employer] FISH OIL) 1000 MG CAPS Take 1 capsule by mouth daily. 90 capsule   . risedronate (ACTONEL) 150 MG tablet Take 1 tablet (150 mg total) by mouth every 30 (thirty) days. with water on empty stomach, nothing by mouth or lie down  for next 30 minutes. 1 tablet 12  . SALINE NASAL SPRAY NA Place into the nose.    . simvastatin (ZOCOR) 40 MG tablet TAKE 1 TABLET ONCE DAILY. 90 tablet 3  . SYNTHROID 50 MCG tablet TAKE 1 TABLET ONCE DAILY BEFORE BREAKFAST. 30 tablet 11  . VESICARE 10 MG tablet Take 1 tablet by mouth daily.     Facility-Administered Medications Prior to Visit  Medication Dose Route Frequency Provider Last Rate Last Dose  . triamcinolone acetonide (KENALOG) 10 MG/ML injection 10 mg  10 mg Other Once [name], [name]        ROS Review of Systems  Constitutional: Positive for fatigue. Negative for activity change, appetite change, chills and unexpected weight change.  HENT: Positive for postnasal drip, rhinorrhea and voice change. Negative for congestion, mouth sores, nosebleeds and sinus pressure.   Eyes: Negative for visual disturbance.  Respiratory: Negative for cough and chest tightness.   Gastrointestinal: Negative for abdominal pain and nausea.  Genitourinary: Negative for difficulty urinating, frequency and vaginal pain.  Musculoskeletal: Negative for back pain and gait problem.  Skin: Negative for pallor and rash.  Neurological: Negative for dizziness, tremors, weakness, numbness and headaches.  Psychiatric/Behavioral: Negative for confusion and sleep disturbance.    Objective:  BP 130/80  Pulse 79   Temp 97.8 F (36.6 C) (Oral)   SpO2 95%   BP Readings from Last 3 Encounters:  08/06/16 130/80  07/07/16 128/76  06/11/16 118/72    Wt Readings from Last 3 Encounters:  01/28/16 127 lb (57.6 kg)  02/19/15 124 lb (56.2 kg)  12/04/14 128 lb (58.1 kg)    Physical Exam  Constitutional: She appears well-developed. No distress.  HENT:  Head: Normocephalic.  Right Ear: External ear normal.  Left Ear: External ear normal.  Nose: Nose normal.  Mouth/Throat: Oropharynx is clear and moist.  Eyes: Conjunctivae are normal. Pupils are equal, round, and reactive to light. Right eye exhibits  no discharge. Left eye exhibits no discharge.  Neck: Normal range of motion. Neck supple. No JVD present. No tracheal deviation present. No thyromegaly present.  Cardiovascular: Normal rate, regular rhythm and normal heart sounds.   Pulmonary/Chest: No stridor. No respiratory distress. She has no wheezes.  Abdominal: Soft. Bowel sounds are normal. She exhibits no distension and no mass. There is no tenderness. There is no rebound and no guarding.  Musculoskeletal: She exhibits no edema or tenderness.  Lymphadenopathy:    She has no cervical adenopathy.  Neurological: She displays normal reflexes. No cranial nerve deficit. She exhibits normal muscle tone. Coordination normal.  Skin: No rash noted. No erythema.  Psychiatric: She has a normal mood and affect. Her behavior is normal. Judgment and thought content normal.  hoarse  Lab Results  Component Value Date   WBC 5.3 02/17/2016   HGB 13.6 02/17/2016   HCT 40.0 02/17/2016   PLT 273.0 02/17/2016   GLUCOSE 97 07/08/2016   CHOL 220 (H) 07/08/2016   TRIG 136.0 07/08/2016   HDL 63.60 07/08/2016   LDLCALC 130 (H) 07/08/2016   ALT 42 (H) 07/08/2016   AST 23 07/08/2016   NA 139 07/08/2016   K 4.2 07/08/2016   CL 103 07/08/2016   CREATININE 0.68 07/08/2016   BUN 16 07/08/2016   CO2 29 07/08/2016   TSH 1.73 07/08/2016   HGBA1C 6.0 07/08/2016    Mm Screening Breast Tomo Bilateral  Result Date: 08/06/2015 CLINICAL DATA:  Screening. EXAM: DIGITAL SCREENING BILATERAL MAMMOGRAM WITH 3D TOMO WITH CAD COMPARISON:  Previous exam(s). ACR Breast Density Category b: There are scattered areas of fibroglandular density. FINDINGS: There are no findings suspicious for malignancy. Images were processed with CAD. IMPRESSION: No mammographic evidence of malignancy. A result letter of this screening mammogram will be mailed directly to the patient. RECOMMENDATION: Screening mammogram in one year. (Code:SM-B-01Y) BI-RADS CATEGORY  1: Negative. Electronically  Signed   By: Altamese Cabal M.D.   On: 08/06/2015 14:02    Assessment & Plan:   There are no diagnoses linked to this encounter. I am having Ms. Breau maintain her aspirin, calcium-vitamin D, SALINE NASAL SPRAY NA, Vitamin D, albuterol, ipratropium, EPIPEN 2-PAK, montelukast, amoxicillin, [employer] FISH OIL, loratadine, VESICARE, ESTRACE VAGINAL, simvastatin, fluticasone, halobetasol, LORazepam, budesonide-formoterol, risedronate, DEXILANT, and SYNTHROID. We will continue to administer triamcinolone acetonide.  No orders of the defined types were placed in this encounter.    Follow-up: No Follow-up on file.  Walker Kehr, MD

## 2016-08-06 NOTE — Assessment & Plan Note (Signed)
ENT ref Dr Thornell Mule CT sinuses

## 2016-08-06 NOTE — Assessment & Plan Note (Signed)
US

## 2016-08-06 NOTE — Assessment & Plan Note (Signed)
Worse Flonase, Atrovent nasal, Singulair, Claritin ENT ref Dr Thornell Mule

## 2016-08-07 ENCOUNTER — Ambulatory Visit (INDEPENDENT_AMBULATORY_CARE_PROVIDER_SITE_OTHER): Payer: Medicare Other | Admitting: Family Medicine

## 2016-08-07 ENCOUNTER — Encounter: Payer: Self-pay | Admitting: Family Medicine

## 2016-08-07 VITALS — BP 109/72 | HR 86 | Temp 97.9°F | Resp 16

## 2016-08-07 DIAGNOSIS — S80812A Abrasion, left lower leg, initial encounter: Secondary | ICD-10-CM

## 2016-08-07 NOTE — Progress Notes (Signed)
Pre visit review using our clinic review tool, if applicable. No additional management support is needed unless otherwise documented below in the visit note. 

## 2016-08-07 NOTE — Progress Notes (Signed)
[name] , 05/18/1936, 80 y.o., female MRN: [id number] Patient Care Team    Relationship Specialty Notifications Start End  [name] Anger, MD PCP - General   11/11/10    Comment: Merged  [name] Laos, MD (Inactive)  Obstetrics and Gynecology  03/15/11   [name] Shipper, MD  Gastroenterology  03/15/11   [name] Artist, MD  Cardiology  03/15/11     CC: Abrasion  Subjective: Pt presents for an acute OV with complaints of left shin abrasion that occurred a few hours ago this morning. She states she was climbing in/out of hot tub and tripped, dragging her shin over the marble edging on the hot tub. She immediatley cleaned the area with warm water and applied a clean wet cloth over it, and came straight to the clinic. She denies any other injuries from the trip/event. She is able to walk without pain. She is not on blood thinners.   Allergies  Allergen Reactions  . Bee Venom Itching, Swelling and Rash    Itching, swelling and rash with bee stings, patient has epi pen  . Neosporin [Neomycin-Bacitracin Zn-Polymyx]    Social History  Substance Use Topics  . Smoking status: Former Research scientist (life sciences)  . Smokeless tobacco: Never Used  . Alcohol use 3.0 oz/week    5 Standard drinks or equivalent per week     Comment: Socially   Past Medical History:  Diagnosis Date  . Allergic rhinitis   . Atrophic vaginitis   . Baker's cyst    Left-Dr. Aluisio  . Cataract    Dr. Katy Fitch  . Colon polyps   . Fibroid   . Gastritis    chronic  . GERD (gastroesophageal reflux disease)   . Heart murmur   . Hemorrhoids   . Hyperlipidemia   . IBS (irritable bowel syndrome)   . MVP (mitral valve prolapse)    Antibiotics required for dental procedures  . OA (osteoarthritis)   . Osteoporosis 05/2014   T score distal third radius -2.5  Spine, left and right hip all show statistically significant improvement.  . Overactive bladder   . Thyroid disease   . Thyroid nodule   . Torn meniscus    bilateral    Past Surgical History:  Procedure Laterality Date  . CATARACT EXTRACTION, BILATERAL    . TONSILLECTOMY     Family History  Problem Relation Age of Onset  . Heart disease Mother   . Hypertension Mother   . Osteoporosis Mother   . Congestive Heart Failure Mother   . Pancreatic cancer Father   . Heart attack Brother   . Colon cancer Neg Hx   . Colon polyps Neg Hx   . Rectal cancer Neg Hx   . Stomach cancer Neg Hx      Medication List       Accurate as of 08/07/16  1:01 PM. Always use your most recent med list.          albuterol 108 (90 Base) MCG/ACT inhaler Commonly known as:  PROVENTIL HFA;VENTOLIN HFA Inhale 2 puffs into the lungs every 6 (six) hours as needed for wheezing.   amoxicillin 500 MG capsule Commonly known as:  AMOXIL Reported on 06/11/2016   aspirin 81 MG tablet Take 81 mg by mouth daily.   budesonide-formoterol 80-4.5 MCG/ACT inhaler Commonly known as:  SYMBICORT Inhale 2 puffs into the lungs 2 (two) times daily.   calcium-vitamin D 500-200 MG-UNIT tablet Take 3 tablets by mouth daily.  DEXILANT 60 MG capsule Generic drug:  dexlansoprazole TAKE (1) CAPSULE DAILY.   EPIPEN 2-PAK 0.3 mg/0.3 mL Soaj injection Generic drug:  EPINEPHrine Inject 0.3 mg as directed as needed.   ESTRACE VAGINAL 0.1 MG/GM vaginal cream Generic drug:  estradiol PLACE 1 APPLICATORFUL VAGINALLY 3 TIMES A WEEK.   fluticasone 50 MCG/ACT nasal spray Commonly known as:  FLONASE USE 2 SPRAYS IN EACH NOSTRIL ONCE DAILY   halobetasol 0.05 % cream Commonly known as:  ULTRAVATE Apply topically 2 (two) times daily.   ipratropium 0.06 % nasal spray Commonly known as:  ATROVENT Place 1 spray into the nose 3 (three) times daily as needed for rhinitis.   loratadine 10 MG tablet Commonly known as:  CLARITIN Take 10 mg by mouth daily.   LORazepam 1 MG tablet Commonly known as:  ATIVAN TAKE 1 OR 2 TABLETS TWICE A DAY AS NEEDED FOR ANXIETY.   montelukast 10 MG  tablet Commonly known as:  SINGULAIR Take 10 mg by mouth at bedtime.   SALINE NASAL SPRAY NA Place into the nose.   simvastatin 40 MG tablet Commonly known as:  ZOCOR TAKE 1 TABLET ONCE DAILY.   SYNTHROID 50 MCG tablet Generic drug:  levothyroxine TAKE 1 TABLET ONCE DAILY BEFORE BREAKFAST.   VESICARE 10 MG tablet Generic drug:  solifenacin Take 1 tablet by mouth daily.   Vitamin D 400 units capsule Take 400 Units by mouth daily.       No results found for this or any previous visit (from the past 24 hour(s)). No results found.   ROS: Negative, with the exception of above mentioned in HPI   Objective:  BP 109/72   Pulse 86   Temp 97.9 F (36.6 C) (Oral)   Resp 16   SpO2 98%  There is no height or weight on file to calculate BMI. Gen: Afebrile. No acute distress. Nontoxic in appearance, well developed, well nourished.  HENT: AT. [place]. MMM Eyes:Pupils Equal Round Reactive to light, Extraocular movements intact,  Conjunctiva without redness, discharge or icterus. Skin: Large open abrasion left anterior shin ~ 5 in length by ~2.5 in width. Bleeding controlled. No FB visualized.    Assessment/Plan: [name] is a 80 y.o. female present for acute OV for  Abrasion of anterior left lower leg, initial encounter - Wound cleansed thoroughly with peroxide and saline.  - Discussed with patient and her husband, this well take some time to heal by granulation. - Pt is allergic to abx ointments. Wet to dry pressure dressing applied in office. Pt was encouraged to clean area daily with warm soapy water. Apply clean pressure dressing daily with bag balm ointment on wound.  - no standing water emersion (bath tub/pools etcs) - tdap UTD.  - F/U next week with PCP, sooner if area appears red, warm, shows signs of pus-like drainage or she becomes feverish.   electronically signed by:  Howard Pouch, DO  [place]

## 2016-08-07 NOTE — Patient Instructions (Addendum)
Keep area clean and dry.  Watch daily with soapy water.  Cover with BAG BALM and TELFA (non-adherent dressing). Over telfa either put guaze and ACE wrap or stretch guaze(cotton wrap). Keep elevated when able.   Watch for signs of infection increase redness, fever, chills, pus-like drainage.   Follow up this week with your PCP.

## 2016-08-08 ENCOUNTER — Encounter: Payer: Self-pay | Admitting: Internal Medicine

## 2016-08-08 ENCOUNTER — Telehealth: Payer: Self-pay | Admitting: Internal Medicine

## 2016-08-08 NOTE — Telephone Encounter (Signed)
[name],  Please call Vaneisha Horky: any change of plans? Should I ref [name] to see a neurologist? Thx

## 2016-08-10 DIAGNOSIS — Z961 Presence of intraocular lens: Secondary | ICD-10-CM | POA: Diagnosis not present

## 2016-08-10 DIAGNOSIS — H10413 Chronic giant papillary conjunctivitis, bilateral: Secondary | ICD-10-CM | POA: Diagnosis not present

## 2016-08-10 DIAGNOSIS — H01022 Squamous blepharitis right lower eyelid: Secondary | ICD-10-CM | POA: Diagnosis not present

## 2016-08-10 DIAGNOSIS — H01025 Squamous blepharitis left lower eyelid: Secondary | ICD-10-CM | POA: Diagnosis not present

## 2016-08-10 DIAGNOSIS — H01021 Squamous blepharitis right upper eyelid: Secondary | ICD-10-CM | POA: Diagnosis not present

## 2016-08-10 DIAGNOSIS — H01024 Squamous blepharitis left upper eyelid: Secondary | ICD-10-CM | POA: Diagnosis not present

## 2016-08-10 DIAGNOSIS — H04123 Dry eye syndrome of bilateral lacrimal glands: Secondary | ICD-10-CM | POA: Diagnosis not present

## 2016-08-11 ENCOUNTER — Ambulatory Visit
Admission: RE | Admit: 2016-08-11 | Discharge: 2016-08-11 | Disposition: A | Discharge status: Home / Self Care | Payer: Medicare Other | Source: Ambulatory Visit | Attending: Internal Medicine | Admitting: Internal Medicine

## 2016-08-11 DIAGNOSIS — Z1231 Encounter for screening mammogram for malignant neoplasm of breast: Secondary | ICD-10-CM | POA: Diagnosis not present

## 2016-08-11 NOTE — Telephone Encounter (Signed)
OK. Thx

## 2016-08-11 NOTE — Telephone Encounter (Signed)
I called Mrs. Cataldi- she states you should Mr. Covalt to neurologist.

## 2016-08-12 ENCOUNTER — Ambulatory Visit (INDEPENDENT_AMBULATORY_CARE_PROVIDER_SITE_OTHER): Payer: Medicare Other | Admitting: Internal Medicine

## 2016-08-12 ENCOUNTER — Encounter: Payer: Self-pay | Admitting: Internal Medicine

## 2016-08-12 ENCOUNTER — Other Ambulatory Visit: Payer: Self-pay | Admitting: Internal Medicine

## 2016-08-12 DIAGNOSIS — S8001XD Contusion of right knee, subsequent encounter: Secondary | ICD-10-CM

## 2016-08-12 DIAGNOSIS — S80812D Abrasion, left lower leg, subsequent encounter: Secondary | ICD-10-CM | POA: Diagnosis not present

## 2016-08-12 DIAGNOSIS — S8001XA Contusion of right knee, initial encounter: Secondary | ICD-10-CM

## 2016-08-12 DIAGNOSIS — S80819A Abrasion, unspecified lower leg, initial encounter: Secondary | ICD-10-CM | POA: Insufficient documentation

## 2016-08-12 HISTORY — DX: Contusion of right knee, initial encounter: S80.01XA

## 2016-08-12 NOTE — Assessment & Plan Note (Signed)
Local care and Tylenol

## 2016-08-12 NOTE — Progress Notes (Signed)
Pre visit review using our clinic review tool, if applicable. No additional management support is needed unless otherwise documented below in the visit note. 

## 2016-08-12 NOTE — Progress Notes (Signed)
Subjective:  Patient ID: [name], female    DOB: May 06, 1936  Age: 80 y.o. MRN: [id number]  CC: No chief complaint on file.   HPI Amrita Nuckolls presents for a LLE abrasion f/u  Outpatient Medications Prior to Visit  Medication Sig Dispense Refill  . albuterol (PROVENTIL HFA;VENTOLIN HFA) 108 (90 BASE) MCG/ACT inhaler Inhale 2 puffs into the lungs every 6 (six) hours as needed for wheezing. 1 Inhaler 3  . amoxicillin (AMOXIL) 500 MG capsule Reported on 06/11/2016    . aspirin 81 MG tablet Take 81 mg by mouth daily.      . budesonide-formoterol (SYMBICORT) 80-4.5 MCG/ACT inhaler Inhale 2 puffs into the lungs 2 (two) times daily. 1 Inhaler 0  . Calcium Carbonate-Vitamin D (CALCIUM-VITAMIN D) 500-200 MG-UNIT per tablet Take 3 tablets by mouth daily.     . Cholecalciferol (VITAMIN D) 400 UNITS capsule Take 400 Units by mouth daily.    [name] DEXILANT 60 MG capsule TAKE (1) CAPSULE DAILY. 30 capsule 3  . EPIPEN 2-PAK 0.3 MG/0.3ML SOAJ injection Inject 0.3 mg as directed as needed.     [name] ESTRACE VAGINAL 0.1 MG/GM vaginal cream PLACE 1 APPLICATORFUL VAGINALLY 3 TIMES A WEEK. 42.5 g 3  . fluticasone (FLONASE) 50 MCG/ACT nasal spray USE 2 SPRAYS IN EACH NOSTRIL ONCE DAILY 16 g 0  . halobetasol (ULTRAVATE) 0.05 % cream Apply topically 2 (two) times daily. 50 g 1  . ipratropium (ATROVENT) 0.06 % nasal spray Place 1 spray into the nose 3 (three) times daily as needed for rhinitis. 15 mL 0  . loratadine (CLARITIN) 10 MG tablet Take 10 mg by mouth daily.    [name] LORazepam (ATIVAN) 1 MG tablet TAKE 1 OR 2 TABLETS TWICE A DAY AS NEEDED FOR ANXIETY. 100 tablet 3  . montelukast (SINGULAIR) 10 MG tablet Take 10 mg by mouth at bedtime.     [name] SALINE NASAL SPRAY NA Place into the nose.    . simvastatin (ZOCOR) 40 MG tablet TAKE 1 TABLET ONCE DAILY. 90 tablet 3  . SYNTHROID 50 MCG tablet TAKE 1 TABLET ONCE DAILY BEFORE BREAKFAST. 30 tablet 11  . VESICARE 10 MG tablet Take 1 tablet by mouth every other day.       Facility-Administered Medications Prior to Visit  Medication Dose Route Frequency Provider Last Rate Last Dose  . triamcinolone acetonide (KENALOG) 10 MG/ML injection 10 mg  10 mg Other Once [name], [name]        ROS Review of Systems  Constitutional: Negative for activity change, appetite change, chills, fatigue and unexpected weight change.  HENT: Negative for congestion, mouth sores and sinus pressure.   Eyes: Negative for visual disturbance.  Respiratory: Negative for cough and chest tightness.   Gastrointestinal: Negative for abdominal pain and nausea.  Genitourinary: Negative for difficulty urinating, frequency and vaginal pain.  Musculoskeletal: Negative for back pain and gait problem.  Skin: Negative for pallor and rash.  Neurological: Negative for dizziness, tremors, weakness, numbness and headaches.  Psychiatric/Behavioral: Negative for confusion, sleep disturbance and suicidal ideas.    Objective:  BP 120/84   Pulse 89   Wt 127 lb (57.6 kg)   SpO2 96%   BMI 24.39 kg/m   BP Readings from Last 3 Encounters:  08/12/16 120/84  08/07/16 109/72  08/06/16 130/80    Wt Readings from Last 3 Encounters:  08/12/16 127 lb (57.6 kg)  01/28/16 127 lb (57.6 kg)  02/19/15 124 lb (56.2 kg)  Physical Exam  Constitutional: She appears well-developed. No distress.  HENT:  Head: Normocephalic.  Right Ear: External ear normal.  Left Ear: External ear normal.  Nose: Nose normal.  Mouth/Throat: Oropharynx is clear and moist.  Eyes: Conjunctivae are normal. Pupils are equal, round, and reactive to light. Right eye exhibits no discharge. Left eye exhibits no discharge.  Neck: Normal range of motion. Neck supple. No JVD present. No tracheal deviation present. No thyromegaly present.  Cardiovascular: Normal rate, regular rhythm and normal heart sounds.   Pulmonary/Chest: No stridor. No respiratory distress. She has no wheezes.  Abdominal: Soft. Bowel sounds are normal.  She exhibits no distension and no mass. There is no tenderness. There is no rebound and no guarding.  Musculoskeletal: She exhibits no edema or tenderness.  Lymphadenopathy:    She has no cervical adenopathy.  Neurological: She displays normal reflexes. No cranial nerve deficit. She exhibits normal muscle tone. Coordination normal.  Skin: No rash noted. No erythema.  Psychiatric: She has a normal mood and affect. Her behavior is normal. Judgment and thought content normal.   a large LLE abrasion - no signs of infection Lab Results  Component Value Date   WBC 5.3 02/17/2016   HGB 13.6 02/17/2016   HCT 40.0 02/17/2016   PLT 273.0 02/17/2016   GLUCOSE 97 07/08/2016   CHOL 220 (H) 07/08/2016   TRIG 136.0 07/08/2016   HDL 63.60 07/08/2016   LDLCALC 130 (H) 07/08/2016   ALT 42 (H) 07/08/2016   AST 23 07/08/2016   NA 139 07/08/2016   K 4.2 07/08/2016   CL 103 07/08/2016   CREATININE 0.68 07/08/2016   BUN 16 07/08/2016   CO2 29 07/08/2016   TSH 1.73 07/08/2016   HGBA1C 6.0 07/08/2016    No results found.  Assessment & Plan:   There are no diagnoses linked to this encounter. I am having Ms. Bascom maintain her aspirin, calcium-vitamin D, SALINE NASAL SPRAY NA, Vitamin D, albuterol, ipratropium, EPIPEN 2-PAK, montelukast, amoxicillin, loratadine, VESICARE, ESTRACE VAGINAL, simvastatin, fluticasone, halobetasol, LORazepam, budesonide-formoterol, DEXILANT, and SYNTHROID. We will continue to administer triamcinolone acetonide.  No orders of the defined types were placed in this encounter.    Follow-up: No Follow-up on file.  Walker Kehr, MD

## 2016-08-12 NOTE — Assessment & Plan Note (Signed)
Wound care discussed

## 2016-08-13 ENCOUNTER — Ambulatory Visit
Admission: RE | Admit: 2016-08-13 | Discharge: 2016-08-13 | Disposition: A | Discharge status: Home / Self Care | Payer: Medicare Other | Source: Ambulatory Visit | Attending: Internal Medicine | Admitting: Internal Medicine

## 2016-08-13 DIAGNOSIS — J329 Chronic sinusitis, unspecified: Secondary | ICD-10-CM | POA: Diagnosis not present

## 2016-08-13 DIAGNOSIS — Z23 Encounter for immunization: Secondary | ICD-10-CM

## 2016-08-13 DIAGNOSIS — J320 Chronic maxillary sinusitis: Secondary | ICD-10-CM

## 2016-08-13 DIAGNOSIS — E039 Hypothyroidism, unspecified: Secondary | ICD-10-CM | POA: Diagnosis not present

## 2016-08-15 ENCOUNTER — Encounter: Payer: Self-pay | Admitting: Internal Medicine

## 2016-08-16 ENCOUNTER — Encounter: Payer: Self-pay | Admitting: Internal Medicine

## 2016-08-18 ENCOUNTER — Ambulatory Visit (INDEPENDENT_AMBULATORY_CARE_PROVIDER_SITE_OTHER): Payer: Medicare Other | Admitting: Internal Medicine

## 2016-08-18 ENCOUNTER — Encounter: Payer: Self-pay | Admitting: Internal Medicine

## 2016-08-18 DIAGNOSIS — R21 Rash and other nonspecific skin eruption: Secondary | ICD-10-CM

## 2016-08-18 DIAGNOSIS — S80812D Abrasion, left lower leg, subsequent encounter: Secondary | ICD-10-CM

## 2016-08-18 DIAGNOSIS — R5383 Other fatigue: Secondary | ICD-10-CM | POA: Diagnosis not present

## 2016-08-18 MED ORDER — TRIAMCINOLONE ACETONIDE 0.5 % EX OINT: 1.0000 "application " | TOPICAL_OINTMENT | Freq: Three times a day (TID) | CUTANEOUS | 0 refills | Status: DC

## 2016-08-18 MED ORDER — CEPHALEXIN 500 MG PO CAPS: 500.0000 mg | ORAL_CAPSULE | Freq: Four times a day (QID) | ORAL | 1 refills | Status: DC

## 2016-08-18 MED ORDER — DOXYCYCLINE HYCLATE 100 MG PO TABS: 100.0000 mg | ORAL_TABLET | Freq: Two times a day (BID) | ORAL | 0 refills | Status: DC

## 2016-08-18 NOTE — Assessment & Plan Note (Signed)
Doxy if worse 

## 2016-08-18 NOTE — Assessment & Plan Note (Addendum)
?  med allergy to bag balm Triamcinolone tid x 1 day D/c bag balm Use Vaseline

## 2016-08-18 NOTE — Assessment & Plan Note (Signed)
Periodic fatigue of ?etiology Hold Zocor x 1 month

## 2016-08-18 NOTE — Progress Notes (Signed)
Pre visit review using our clinic review tool, if applicable. No additional management support is needed unless otherwise documented below in the visit note. 

## 2016-08-18 NOTE — Progress Notes (Signed)
Subjective:  Patient ID: [name], female    DOB: August 10, 1936  Age: 80 y.o. MRN: [id number]  CC: No chief complaint on file.   HPI [name] presents for L leg itching and redness around the wound C/o fatigue x 2 months  Outpatient Medications Prior to Visit  Medication Sig Dispense Refill  . albuterol (PROVENTIL HFA;VENTOLIN HFA) 108 (90 BASE) MCG/ACT inhaler Inhale 2 puffs into the lungs every 6 (six) hours as needed for wheezing. 1 Inhaler 3  . amoxicillin (AMOXIL) 500 MG capsule Reported on 06/11/2016    . aspirin 81 MG tablet Take 81 mg by mouth daily.      . budesonide-formoterol (SYMBICORT) 80-4.5 MCG/ACT inhaler Inhale 2 puffs into the lungs 2 (two) times daily. 1 Inhaler 0  . Calcium Carbonate-Vitamin D (CALCIUM-VITAMIN D) 500-200 MG-UNIT per tablet Take 3 tablets by mouth daily.     . Cholecalciferol (VITAMIN D) 400 UNITS capsule Take 400 Units by mouth daily.    [name] DEXILANT 60 MG capsule TAKE (1) CAPSULE DAILY. 30 capsule 3  . EPIPEN 2-PAK 0.3 MG/0.3ML SOAJ injection Inject 0.3 mg as directed as needed.     [name] ESTRACE VAGINAL 0.1 MG/GM vaginal cream PLACE 1 APPLICATORFUL VAGINALLY 3 TIMES A WEEK. 42.5 g 3  . fluticasone (FLONASE) 50 MCG/ACT nasal spray USE 2 SPRAYS IN EACH NOSTRIL ONCE DAILY 16 g 0  . halobetasol (ULTRAVATE) 0.05 % cream Apply topically 2 (two) times daily. 50 g 1  . ipratropium (ATROVENT) 0.06 % nasal spray Place 1 spray into the nose 3 (three) times daily as needed for rhinitis. 15 mL 0  . loratadine (CLARITIN) 10 MG tablet Take 10 mg by mouth daily.    [name] LORazepam (ATIVAN) 1 MG tablet TAKE 1 OR 2 TABLETS TWICE A DAY AS NEEDED FOR ANXIETY. 100 tablet 3  . montelukast (SINGULAIR) 10 MG tablet Take 10 mg by mouth at bedtime.     [name] SALINE NASAL SPRAY NA Place into the nose.    . simvastatin (ZOCOR) 40 MG tablet TAKE 1 TABLET ONCE DAILY. 90 tablet 3  . SYNTHROID 50 MCG tablet TAKE 1 TABLET ONCE DAILY BEFORE BREAKFAST. 30 tablet 11  . VESICARE 10 MG tablet  Take 1 tablet by mouth every other day.      Facility-Administered Medications Prior to Visit  Medication Dose Route Frequency Provider Last Rate Last Dose  . triamcinolone acetonide (KENALOG) 10 MG/ML injection 10 mg  10 mg Other Once [name], [name]        ROS Review of Systems  Constitutional: Positive for fatigue. Negative for activity change, appetite change, chills and unexpected weight change.  HENT: Negative for congestion, mouth sores and sinus pressure.   Eyes: Negative for visual disturbance.  Respiratory: Negative for cough and chest tightness.   Gastrointestinal: Negative for abdominal pain and nausea.  Genitourinary: Negative for difficulty urinating, frequency and vaginal pain.  Musculoskeletal: Negative for back pain and gait problem.  Skin: Positive for color change and wound. Negative for pallor and rash.  Neurological: Negative for dizziness, tremors, weakness, numbness and headaches.  Psychiatric/Behavioral: Negative for confusion and sleep disturbance.    Objective:  BP 130/72   Pulse 82   Temp 98.2 F (36.8 C) (Oral)   SpO2 97%   BP Readings from Last 3 Encounters:  08/18/16 130/72  08/12/16 120/84  08/07/16 109/72    Wt Readings from Last 3 Encounters:  08/12/16 127 lb (57.6 kg)  01/28/16 127 lb (  57.6 kg)  02/19/15 124 lb (56.2 kg)    Physical Exam  Musculoskeletal: She exhibits no edema or tenderness.  Skin: There is erythema.  LLE: slight erythema at some adges of the wounds - overall looks better; no signs of infection  Lab Results  Component Value Date   WBC 5.3 02/17/2016   HGB 13.6 02/17/2016   HCT 40.0 02/17/2016   PLT 273.0 02/17/2016   GLUCOSE 97 07/08/2016   CHOL 220 (H) 07/08/2016   TRIG 136.0 07/08/2016   HDL 63.60 07/08/2016   LDLCALC 130 (H) 07/08/2016   ALT 42 (H) 07/08/2016   AST 23 07/08/2016   NA 139 07/08/2016   K 4.2 07/08/2016   CL 103 07/08/2016   CREATININE 0.68 07/08/2016   BUN 16 07/08/2016   CO2 29  07/08/2016   TSH 1.73 07/08/2016   HGBA1C 6.0 07/08/2016    US Soft Tissue Head/neck  Result Date: 08/13/2016 CLINICAL DATA:  80 year old female with chronic hypothyroidism on thyroid hormone replacement therapy. History of thyroid nodules. EXAM: THYROID ULTRASOUND TECHNIQUE: Ultrasound examination of the thyroid gland and adjacent soft tissues was performed. COMPARISON:  Prior thyroid ultrasound 08/05/2015 and 08/24/2011 FINDINGS: Parenchymal Echotexture: Markedly heterogenous Estimated total number of nodules >/= 1 cm: 0 Number of spongiform nodules > 2 cm not described below (TR1): 0 Number of mixed cystic and solid nodules > 1.5 cm not described below ([place]): 0 _________________________________________________________ Isthmus: 0.2 cm No discrete nodules are identified within the thyroid isthmus. _________________________________________________________ Right lobe: 3.2 x 1.3 x 1.3 cm Diffusely heterogeneous thyroid parenchyma. Small isoechoic nodule versus pseudo nodule in the lower pole does not meet consensus criteria for dedicated follow-up or biopsy. _________________________________________________________ Left lobe: 4.1 x 1.4 x 1.1 cm Diffusely heterogeneous thyroid parenchyma. Tiny hypoechoic nodule in the upper gland is insignificantly changed compared to prior and requires no follow-up. IMPRESSION: Diffusely heterogeneous thyroid gland consistent with the clinical history of chronic hypothyroidism and thyroid hormone replacement therapy. Incidentally noted tiny thyroid nodule remains unchanged and does not meet consensus criteria for dedicated imaging follow-up. The above is in keeping with the ACR TI-RADS recommendations - J Am Coll Radiol 2017;14:587-595. Electronically Signed   By: Jacqulynn Cadet M.D.   On: 08/13/2016 16:23   Ct Maxillofacial Wo Cm  Result Date: 08/13/2016 CLINICAL DATA:  Chronic maxillary sinusitis. EXAM: CT MAXILLOFACIAL WITHOUT CONTRAST TECHNIQUE: Multidetector CT  imaging of the maxillofacial structures was performed. Multiplanar CT image reconstructions were also generated. A small metallic BB was placed on the right temple in order to reliably differentiate right from left. COMPARISON:  None. FINDINGS: Osseous: No fracture or mandibular dislocation. No destructive process. Bilateral facet arthropathy at C2-3. Orbits: Negative. No traumatic or inflammatory finding. Sinuses: No air-fluid level. Tiny mucous retention cyst in the left maxillary sinus. Otherwise clear paranasal sinuses. Patent ostiomeatal complexes. Soft tissues: Negative. Limited intracranial: No significant or unexpected finding. IMPRESSION: 1. No evidence of sinusitis. Electronically Signed   By: Kathreen Devoid   On: 08/13/2016 15:23    Assessment & Plan:   Diagnoses and all orders for this visit:  Leg abrasion, left, subsequent encounter  Rash and nonspecific skin eruption  Other orders -     Discontinue: doxycycline (VIBRA-TABS) 100 MG tablet; Take 1 tablet (100 mg total) by mouth 2 (two) times daily. -     Discontinue: triamcinolone ointment (KENALOG) 0.5 %; Apply 1 application topically 3 (three) times daily. -     cephALEXin (KEFLEX) 500 MG capsule; Take 1 capsule (  500 mg total) by mouth 4 (four) times daily. -     triamcinolone ointment (KENALOG) 0.5 %; Apply 1 application topically 3 (three) times daily.   I have discontinued [name]'s doxycycline. I am also having her start on cephALEXin. Additionally, I am having her maintain her aspirin, calcium-vitamin D, SALINE NASAL SPRAY NA, Vitamin D, albuterol, ipratropium, EPIPEN 2-PAK, montelukast, amoxicillin, loratadine, VESICARE, ESTRACE VAGINAL, fluticasone, halobetasol, LORazepam, budesonide-formoterol, DEXILANT, SYNTHROID, simvastatin, and triamcinolone ointment. We will continue to administer triamcinolone acetonide.  Meds ordered this encounter  Medications  . DISCONTD: doxycycline (VIBRA-TABS) 100 MG tablet    Sig: Take 1  tablet (100 mg total) by mouth 2 (two) times daily.    Dispense:  20 tablet    Refill:  0  . DISCONTD: triamcinolone ointment (KENALOG) 0.5 %    Sig: Apply 1 application topically 3 (three) times daily.    Dispense:  30 g    Refill:  0  . cephALEXin (KEFLEX) 500 MG capsule    Sig: Take 1 capsule (500 mg total) by mouth 4 (four) times daily.    Dispense:  28 capsule    Refill:  1  . triamcinolone ointment (KENALOG) 0.5 %    Sig: Apply 1 application topically 3 (three) times daily.    Dispense:  30 g    Refill:  0     Follow-up: Return for a follow-up visit.  Walker Kehr, MD

## 2016-08-23 ENCOUNTER — Ambulatory Visit (INDEPENDENT_AMBULATORY_CARE_PROVIDER_SITE_OTHER): Payer: Medicare Other | Admitting: Podiatry

## 2016-08-23 ENCOUNTER — Encounter: Payer: Self-pay | Admitting: Podiatry

## 2016-08-23 DIAGNOSIS — L84 Corns and callosities: Secondary | ICD-10-CM | POA: Diagnosis not present

## 2016-08-23 DIAGNOSIS — M779 Enthesopathy, unspecified: Secondary | ICD-10-CM | POA: Diagnosis not present

## 2016-08-23 DIAGNOSIS — M79672 Pain in left foot: Secondary | ICD-10-CM

## 2016-08-23 MED ORDER — TRIAMCINOLONE ACETONIDE 10 MG/ML IJ SUSP
10.0000 mg | Freq: Once | INTRAMUSCULAR | Status: AC
  Administered 2016-08-23: 10 mg

## 2016-08-25 NOTE — Progress Notes (Signed)
Subjective:     Patient ID: [name], female   DOB: 1936/11/15, 80 y.o.   MRN: [id number]  HPI patient states I'm having a lot of pain between the fourth and fifth toes on my left foot with keratotic lesion formation and fluid buildup and I cannot wear shoe gear comfortably   Review of Systems     Objective:   Physical Exam Neurovascular status intact with inflammatory changes fourth interspace left with pain noted between the toes and keratotic tissue formation    Assessment:     Inflammatory capsulitis with keratotic tissue formation    Plan:     Injected the fourth interspace 3 mg Dexon some Kenalog 5 mill grams Xylocaine and debrided the lesions and reappoint to recheck

## 2016-08-27 ENCOUNTER — Encounter: Payer: Self-pay | Admitting: Internal Medicine

## 2016-09-07 ENCOUNTER — Encounter: Payer: Self-pay | Admitting: Internal Medicine

## 2016-09-08 ENCOUNTER — Encounter: Payer: Self-pay | Admitting: Internal Medicine

## 2016-09-09 ENCOUNTER — Other Ambulatory Visit: Payer: Self-pay | Admitting: Gynecology

## 2016-10-19 ENCOUNTER — Other Ambulatory Visit: Payer: Self-pay | Admitting: Internal Medicine

## 2016-11-01 ENCOUNTER — Other Ambulatory Visit (INDEPENDENT_AMBULATORY_CARE_PROVIDER_SITE_OTHER): Payer: Medicare Other

## 2016-11-01 DIAGNOSIS — E034 Atrophy of thyroid (acquired): Secondary | ICD-10-CM | POA: Diagnosis not present

## 2016-11-01 DIAGNOSIS — E785 Hyperlipidemia, unspecified: Secondary | ICD-10-CM

## 2016-11-01 DIAGNOSIS — R5383 Other fatigue: Secondary | ICD-10-CM | POA: Diagnosis not present

## 2016-11-01 LAB — BASIC METABOLIC PANEL
BUN: 19 mg/dL (ref 6–23)
CO2: 28 mEq/L (ref 19–32)
Calcium: 8.9 mg/dL (ref 8.4–10.5)
Chloride: 102 mEq/L (ref 96–112)
Creatinine, Ser: 0.76 mg/dL (ref 0.40–1.20)
GFR: 78.16 mL/min (ref >60.00)
Glucose, Bld: 99 mg/dL (ref 70–99)
Potassium: 4.7 mEq/L (ref 3.5–5.1)
Sodium: 137 mEq/L (ref 135–145)

## 2016-11-01 LAB — CBC WITH DIFFERENTIAL/PLATELET
Basophils Absolute: 0 10*3/uL (ref 0.0–0.1)
Basophils Relative: 0.5 % (ref 0.0–3.0)
Eosinophils Absolute: 0.2 10*3/uL (ref 0.0–0.7)
Eosinophils Relative: 3.6 % (ref 0.0–5.0)
HCT: 39.4 % (ref 36.0–46.0)
Hemoglobin: 13.2 g/dL (ref 12.0–15.0)
Lymphocytes Relative: 18.3 % (ref 12.0–46.0)
Lymphs Abs: 1.1 10*3/uL (ref 0.7–4.0)
MCHC: 33.6 g/dL (ref 30.0–36.0)
MCV: 92.4 fl (ref 78.0–100.0)
Monocytes Absolute: 0.6 10*3/uL (ref 0.1–1.0)
Monocytes Relative: 10.7 % (ref 3.0–12.0)
Neutro Abs: 4 10*3/uL (ref 1.4–7.7)
Neutrophils Relative %: 66.9 % (ref 43.0–77.0)
Platelets: 294 10*3/uL (ref 150.0–400.0)
RBC: 4.26 Mil/uL (ref 3.87–5.11)
RDW: 12.9 % (ref 11.5–15.5)
WBC: 6 10*3/uL (ref 4.0–10.5)

## 2016-11-01 LAB — LIPID PANEL
Cholesterol: 168 mg/dL (ref 0–200)
HDL: 47.8 mg/dL (ref >39.00)
LDL Cholesterol: 97 mg/dL (ref 0–99)
NonHDL: 120
Total CHOL/HDL Ratio: 4
Triglycerides: 114 mg/dL (ref 0.0–149.0)
VLDL: 22.8 mg/dL (ref 0.0–40.0)

## 2016-11-01 LAB — HEPATIC FUNCTION PANEL
ALT: 16 U/L (ref 0–35)
AST: 17 U/L (ref 0–37)
Albumin: 4 g/dL (ref 3.5–5.2)
Alkaline Phosphatase: 35 U/L — ABNORMAL LOW (ref 39–117)
Bilirubin, Direct: 0.1 mg/dL (ref 0.0–0.3)
Total Bilirubin: 0.5 mg/dL (ref 0.2–1.2)
Total Protein: 7.1 g/dL (ref 6.0–8.3)

## 2016-11-01 LAB — TSH: TSH: 1.74 u[IU]/mL (ref 0.35–4.50)

## 2016-11-05 ENCOUNTER — Other Ambulatory Visit: Payer: Self-pay | Admitting: Internal Medicine

## 2016-11-08 ENCOUNTER — Telehealth: Payer: Self-pay | Admitting: Emergency Medicine

## 2016-11-08 ENCOUNTER — Ambulatory Visit: Payer: Medicare Other | Admitting: Internal Medicine

## 2016-11-08 NOTE — Telephone Encounter (Signed)
Got patient moved

## 2016-11-08 NOTE — Telephone Encounter (Signed)
Pls use 12/13 or 12/14 acute appt Thx

## 2016-11-08 NOTE — Telephone Encounter (Signed)
Pt called and stated she has had a death in the family and wants to know if you can fit her in another time this week for her follow up? You next available follow up appt is Dec 22nd? She said if not she can come today earlier that 10:15. Please advise thanks.

## 2016-11-10 ENCOUNTER — Encounter: Payer: Self-pay | Admitting: Internal Medicine

## 2016-11-10 ENCOUNTER — Ambulatory Visit (INDEPENDENT_AMBULATORY_CARE_PROVIDER_SITE_OTHER): Payer: Medicare Other | Admitting: Internal Medicine

## 2016-11-10 ENCOUNTER — Other Ambulatory Visit: Payer: Self-pay | Admitting: Internal Medicine

## 2016-11-10 DIAGNOSIS — M545 Low back pain, unspecified: Secondary | ICD-10-CM | POA: Insufficient documentation

## 2016-11-10 DIAGNOSIS — E034 Atrophy of thyroid (acquired): Secondary | ICD-10-CM

## 2016-11-10 DIAGNOSIS — K5903 Drug induced constipation: Secondary | ICD-10-CM | POA: Diagnosis not present

## 2016-11-10 DIAGNOSIS — I10 Essential (primary) hypertension: Secondary | ICD-10-CM | POA: Diagnosis not present

## 2016-11-10 DIAGNOSIS — E042 Nontoxic multinodular goiter: Secondary | ICD-10-CM

## 2016-11-10 DIAGNOSIS — G8929 Other chronic pain: Secondary | ICD-10-CM

## 2016-11-10 MED ORDER — PHOSPHATIDYLSERINE-DHA-EPA 100-19.5-6.5 MG PO CAPS: 1.0000 | ORAL_CAPSULE | Freq: Every day | ORAL | 3 refills | Status: DC

## 2016-11-10 MED ORDER — AZITHROMYCIN 250 MG PO TABS: ORAL_TABLET | ORAL | 0 refills | Status: DC

## 2016-11-10 MED ORDER — LORAZEPAM 1 MG PO TABS: 1.0000 mg | ORAL_TABLET | Freq: Two times a day (BID) | ORAL | 3 refills | Status: DC | PRN

## 2016-11-10 NOTE — Patient Instructions (Signed)
Original McKenzie Self Inflating Airback Lumbar Support

## 2016-11-10 NOTE — Progress Notes (Signed)
Pre visit review using our clinic review tool, if applicable. No additional management support is needed unless otherwise documented below in the visit note. 

## 2016-11-10 NOTE — Assessment & Plan Note (Signed)
Well exam

## 2016-11-10 NOTE — Progress Notes (Signed)
Subjective:  Patient ID: [name], female    DOB: 02-23-1936  Age: 80 y.o. MRN: [id number]  CC: No chief complaint on file.   HPI [name] presents for dyslipidemia, anxiety, OAB f/u    Outpatient Medications Prior to Visit  Medication Sig Dispense Refill  . albuterol (PROVENTIL HFA;VENTOLIN HFA) 108 (90 BASE) MCG/ACT inhaler Inhale 2 puffs into the lungs every 6 (six) hours as needed for wheezing. 1 Inhaler 3  . amoxicillin (AMOXIL) 500 MG capsule Reported on 06/11/2016    . aspirin 81 MG tablet Take 81 mg by mouth daily.      . budesonide-formoterol (SYMBICORT) 80-4.5 MCG/ACT inhaler Inhale 2 puffs into the lungs 2 (two) times daily. 1 Inhaler 0  . Calcium Carbonate-Vitamin D (CALCIUM-VITAMIN D) 500-200 MG-UNIT per tablet Take 3 tablets by mouth daily.     [name] DEXILANT 60 MG capsule TAKE (1) CAPSULE DAILY. 30 capsule 6  . EPIPEN 2-PAK 0.3 MG/0.3ML SOAJ injection Inject 0.3 mg as directed as needed.     [name] ESTRACE VAGINAL 0.1 MG/GM vaginal cream PLACE 1 APPLICATORFUL VAGINALLY 3 TIMES A WEEK. 42.5 g 3  . fluticasone (FLONASE) 50 MCG/ACT nasal spray USE 2 SPRAYS IN EACH NOSTRIL ONCE DAILY 16 g 0  . halobetasol (ULTRAVATE) 0.05 % cream Apply topically 2 (two) times daily. 50 g 1  . ipratropium (ATROVENT) 0.06 % nasal spray Place 1 spray into the nose 3 (three) times daily as needed for rhinitis. 15 mL 0  . loratadine (CLARITIN) 10 MG tablet Take 10 mg by mouth daily.    [name] LORazepam (ATIVAN) 1 MG tablet TAKE 1 OR 2 TABLETS TWICE A DAY AS NEEDED FOR ANXIETY. 100 tablet 3  . montelukast (SINGULAIR) 10 MG tablet Take 10 mg by mouth at bedtime.     [name] SALINE NASAL SPRAY NA Place into the nose.    . simvastatin (ZOCOR) 40 MG tablet TAKE 1 TABLET ONCE DAILY. 90 tablet 3  . SYNTHROID 50 MCG tablet TAKE 1 TABLET ONCE DAILY BEFORE BREAKFAST. 30 tablet 11  . triamcinolone ointment (KENALOG) 0.5 % Apply 1 application topically 3 (three) times daily. 30 g 0  . VESICARE 10 MG tablet Take 1 tablet  by mouth every other day.     . Cholecalciferol (VITAMIN D) 400 UNITS capsule Take 400 Units by mouth daily.    . cephALEXin (KEFLEX) 500 MG capsule Take 1 capsule (500 mg total) by mouth 4 (four) times daily. (Patient not taking: Reported on 11/10/2016) 28 capsule 1   Facility-Administered Medications Prior to Visit  Medication Dose Route Frequency Provider Last Rate Last Dose  . triamcinolone acetonide (KENALOG) 10 MG/ML injection 10 mg  10 mg Other Once [name], [name]        ROS Review of Systems  Constitutional: Negative for activity change, appetite change, chills, fatigue and unexpected weight change.  HENT: Negative for congestion, mouth sores and sinus pressure.   Eyes: Negative for visual disturbance.  Respiratory: Negative for cough and chest tightness.   Gastrointestinal: Negative for abdominal pain and nausea.  Genitourinary: Negative for difficulty urinating, frequency and vaginal pain.  Musculoskeletal: Negative for back pain and gait problem.  Skin: Negative for pallor and rash.  Neurological: Negative for dizziness, tremors, weakness, numbness and headaches.  Psychiatric/Behavioral: Negative for confusion, sleep disturbance and suicidal ideas. The patient is nervous/anxious.     Objective:  BP 118/60   Pulse 82   SpO2 98%   BP Readings from Last  3 Encounters:  11/10/16 118/60  08/18/16 130/72  08/12/16 120/84    Wt Readings from Last 3 Encounters:  08/12/16 127 lb (57.6 kg)  01/28/16 127 lb (57.6 kg)  02/19/15 124 lb (56.2 kg)    Physical Exam  Constitutional: She appears well-developed. No distress.  HENT:  Head: Normocephalic.  Right Ear: External ear normal.  Left Ear: External ear normal.  Nose: Nose normal.  Mouth/Throat: Oropharynx is clear and moist.  Eyes: Conjunctivae are normal. Pupils are equal, round, and reactive to light. Right eye exhibits no discharge. Left eye exhibits no discharge.  Neck: Normal range of motion. Neck supple. No  JVD present. No tracheal deviation present. No thyromegaly present.  Cardiovascular: Normal rate, regular rhythm and normal heart sounds.   Pulmonary/Chest: No stridor. No respiratory distress. She has no wheezes.  Abdominal: Soft. Bowel sounds are normal. She exhibits no distension and no mass. There is no tenderness. There is no rebound and no guarding.  Musculoskeletal: She exhibits no edema or tenderness.  Lymphadenopathy:    She has no cervical adenopathy.  Neurological: She displays normal reflexes. No cranial nerve deficit. She exhibits normal muscle tone. Coordination normal.  Skin: No rash noted. No erythema.  Psychiatric: She has a normal mood and affect. Her behavior is normal. Judgment and thought content normal.    Lab Results  Component Value Date   WBC 6.0 11/01/2016   HGB 13.2 11/01/2016   HCT 39.4 11/01/2016   PLT 294.0 11/01/2016   GLUCOSE 99 11/01/2016   CHOL 168 11/01/2016   TRIG 114.0 11/01/2016   HDL 47.80 11/01/2016   LDLCALC 97 11/01/2016   ALT 16 11/01/2016   AST 17 11/01/2016   NA 137 11/01/2016   K 4.7 11/01/2016   CL 102 11/01/2016   CREATININE 0.76 11/01/2016   BUN 19 11/01/2016   CO2 28 11/01/2016   TSH 1.74 11/01/2016   HGBA1C 6.0 07/08/2016    US Soft Tissue Head/neck  Result Date: 08/13/2016 CLINICAL DATA:  80 year old female with chronic hypothyroidism on thyroid hormone replacement therapy. History of thyroid nodules. EXAM: THYROID ULTRASOUND TECHNIQUE: Ultrasound examination of the thyroid gland and adjacent soft tissues was performed. COMPARISON:  Prior thyroid ultrasound 08/05/2015 and 08/24/2011 FINDINGS: Parenchymal Echotexture: Markedly heterogenous Estimated total number of nodules >/= 1 cm: 0 Number of spongiform nodules > 2 cm not described below (TR1): 0 Number of mixed cystic and solid nodules > 1.5 cm not described below ([place]): 0 _________________________________________________________ Isthmus: 0.2 cm No discrete nodules are  identified within the thyroid isthmus. _________________________________________________________ Right lobe: 3.2 x 1.3 x 1.3 cm Diffusely heterogeneous thyroid parenchyma. Small isoechoic nodule versus pseudo nodule in the lower pole does not meet consensus criteria for dedicated follow-up or biopsy. _________________________________________________________ Left lobe: 4.1 x 1.4 x 1.1 cm Diffusely heterogeneous thyroid parenchyma. Tiny hypoechoic nodule in the upper gland is insignificantly changed compared to prior and requires no follow-up. IMPRESSION: Diffusely heterogeneous thyroid gland consistent with the clinical history of chronic hypothyroidism and thyroid hormone replacement therapy. Incidentally noted tiny thyroid nodule remains unchanged and does not meet consensus criteria for dedicated imaging follow-up. The above is in keeping with the ACR TI-RADS recommendations - J Am Coll Radiol 2017;14:587-595. Electronically Signed   By: Jacqulynn Cadet M.D.   On: 08/13/2016 16:23   Ct Maxillofacial Wo Cm  Result Date: 08/13/2016 CLINICAL DATA:  Chronic maxillary sinusitis. EXAM: CT MAXILLOFACIAL WITHOUT CONTRAST TECHNIQUE: Multidetector CT imaging of the maxillofacial structures was performed. Multiplanar CT image  reconstructions were also generated. A small metallic BB was placed on the right temple in order to reliably differentiate right from left. COMPARISON:  None. FINDINGS: Osseous: No fracture or mandibular dislocation. No destructive process. Bilateral facet arthropathy at C2-3. Orbits: Negative. No traumatic or inflammatory finding. Sinuses: No air-fluid level. Tiny mucous retention cyst in the left maxillary sinus. Otherwise clear paranasal sinuses. Patent ostiomeatal complexes. Soft tissues: Negative. Limited intracranial: No significant or unexpected finding. IMPRESSION: 1. No evidence of sinusitis. Electronically Signed   By: Kathreen Devoid   On: 08/13/2016 15:23    Assessment & Plan:    There are no diagnoses linked to this encounter. I have discontinued Ms. Brookens's Vitamin D and cephALEXin. I am also having her maintain her aspirin, calcium-vitamin D, SALINE NASAL SPRAY NA, albuterol, ipratropium, EPIPEN 2-PAK, montelukast, amoxicillin, loratadine, VESICARE, fluticasone, halobetasol, LORazepam, budesonide-formoterol, SYNTHROID, simvastatin, triamcinolone ointment, ESTRACE VAGINAL, DEXILANT, and Cholecalciferol. We will continue to administer triamcinolone acetonide.  Meds ordered this encounter  Medications  . Cholecalciferol 4000 units TABS    Sig: Take 1 tablet by mouth daily.     Follow-up: No Follow-up on file.  Walker Kehr, MD

## 2016-11-11 ENCOUNTER — Encounter: Payer: Self-pay | Admitting: Internal Medicine

## 2016-11-11 DIAGNOSIS — K5900 Constipation, unspecified: Secondary | ICD-10-CM

## 2016-11-11 DIAGNOSIS — T63441A Toxic effect of venom of bees, accidental (unintentional), initial encounter: Secondary | ICD-10-CM | POA: Diagnosis not present

## 2016-11-11 DIAGNOSIS — K5909 Other constipation: Secondary | ICD-10-CM | POA: Insufficient documentation

## 2016-11-11 DIAGNOSIS — J4520 Mild intermittent asthma, uncomplicated: Secondary | ICD-10-CM | POA: Diagnosis not present

## 2016-11-11 DIAGNOSIS — J3089 Other allergic rhinitis: Secondary | ICD-10-CM | POA: Diagnosis not present

## 2016-11-11 HISTORY — DX: Constipation, unspecified: K59.00

## 2016-11-11 NOTE — Assessment & Plan Note (Signed)
[place] was OK

## 2016-11-11 NOTE — Assessment & Plan Note (Signed)
Chronic, multifactorial Miralax prn

## 2016-11-11 NOTE — Assessment & Plan Note (Signed)
On Levothroid 

## 2016-11-14 ENCOUNTER — Encounter: Payer: Self-pay | Admitting: Internal Medicine

## 2016-11-15 ENCOUNTER — Encounter: Payer: Self-pay | Admitting: Internal Medicine

## 2016-12-08 ENCOUNTER — Encounter: Payer: Self-pay | Admitting: Internal Medicine

## 2016-12-10 ENCOUNTER — Encounter: Payer: Self-pay | Admitting: Internal Medicine

## 2016-12-10 DIAGNOSIS — R3989 Other symptoms and signs involving the genitourinary system: Secondary | ICD-10-CM | POA: Diagnosis not present

## 2016-12-10 DIAGNOSIS — R3915 Urgency of urination: Secondary | ICD-10-CM | POA: Diagnosis not present

## 2016-12-10 DIAGNOSIS — R350 Frequency of micturition: Secondary | ICD-10-CM | POA: Diagnosis not present

## 2016-12-14 ENCOUNTER — Encounter: Payer: Self-pay | Admitting: Internal Medicine

## 2016-12-17 ENCOUNTER — Other Ambulatory Visit: Payer: Self-pay | Admitting: Internal Medicine

## 2016-12-17 MED ORDER — AZITHROMYCIN 250 MG PO TABS: ORAL_TABLET | ORAL | 0 refills | Status: DC

## 2016-12-18 ENCOUNTER — Encounter: Payer: Self-pay | Admitting: Internal Medicine

## 2016-12-30 ENCOUNTER — Ambulatory Visit (INDEPENDENT_AMBULATORY_CARE_PROVIDER_SITE_OTHER): Payer: Medicare Other | Admitting: Podiatry

## 2016-12-30 ENCOUNTER — Encounter: Payer: Self-pay | Admitting: Podiatry

## 2016-12-30 DIAGNOSIS — L600 Ingrowing nail: Secondary | ICD-10-CM | POA: Diagnosis not present

## 2016-12-30 NOTE — Patient Instructions (Signed)

## 2016-12-31 NOTE — Progress Notes (Signed)
Subjective:     Patient ID: [name], female   DOB: 10/29/1936, 81 y.o.   MRN: [id number]  HPI patient presents with painful ingrown toenail deformity right hallux and lesion between the fourth and fifth toes left   Review of Systems     Objective:   Physical Exam Neurovascular status intact with incurvated medial border right hallux that's painful when pressed with lesion of the fourth interspace left foot localized in nature    Assessment:     Ingrown toenail deformity right hallux medial border with lesion of the fourth interspace left    Plan:     Recommended removal of nail corner explaining procedure and risk and today I infiltrated 60 mg I can Marcaine mixture remove the medial border exposed the matrix and applied phenol 3 applications 30 seconds followed by alcohol lavage and sterile dressing. Gave instructions on soaks and debrided lesion on the left

## 2017-01-05 ENCOUNTER — Encounter: Payer: Self-pay | Admitting: Internal Medicine

## 2017-01-05 ENCOUNTER — Ambulatory Visit (INDEPENDENT_AMBULATORY_CARE_PROVIDER_SITE_OTHER): Payer: Medicare Other | Admitting: Internal Medicine

## 2017-01-05 DIAGNOSIS — R5382 Chronic fatigue, unspecified: Secondary | ICD-10-CM | POA: Diagnosis not present

## 2017-01-05 DIAGNOSIS — E034 Atrophy of thyroid (acquired): Secondary | ICD-10-CM | POA: Diagnosis not present

## 2017-01-05 DIAGNOSIS — I10 Essential (primary) hypertension: Secondary | ICD-10-CM | POA: Diagnosis not present

## 2017-01-05 NOTE — Progress Notes (Signed)
Pre visit review using our clinic review tool, if applicable. No additional management support is needed unless otherwise documented below in the visit note. 

## 2017-01-05 NOTE — Assessment & Plan Note (Signed)
No other sx [name] is managing it with short rest periods 1-2/d

## 2017-01-05 NOTE — Assessment & Plan Note (Signed)
Labs

## 2017-01-05 NOTE — Progress Notes (Signed)
Subjective:  Patient ID: [name], female    DOB: [birth year]  Age: 81 y.o. MRN: [id number]  CC: No chief complaint on file.   HPI Deici Patient presents for HTN, dyslipidemia, allergies f/u  Outpatient Medications Prior to Visit  Medication Sig Dispense Refill  . albuterol (PROVENTIL HFA;VENTOLIN HFA) 108 (90 BASE) MCG/ACT inhaler Inhale 2 puffs into the lungs every 6 (six) hours as needed for wheezing. 1 Inhaler 3  . amoxicillin (AMOXIL) 500 MG capsule Reported on 06/11/2016    . aspirin 81 MG tablet Take 81 mg by mouth daily.      [name] azithromycin (ZITHROMAX Z-PAK) 250 MG tablet As directed 6 each 0  . budesonide-formoterol (SYMBICORT) 80-4.5 MCG/ACT inhaler Inhale 2 puffs into the lungs 2 (two) times daily. 1 Inhaler 0  . Calcium Carbonate-Vitamin D (CALCIUM-VITAMIN D) 500-200 MG-UNIT per tablet Take 3 tablets by mouth daily.     . Cholecalciferol 4000 units TABS Take 1 tablet by mouth daily.    [name] DEXILANT 60 MG capsule TAKE (1) CAPSULE DAILY. 30 capsule 6  . EPIPEN 2-PAK 0.3 MG/0.3ML SOAJ injection Inject 0.3 mg as directed as needed.     [name] ESTRACE VAGINAL 0.1 MG/GM vaginal cream PLACE 1 APPLICATORFUL VAGINALLY 3 TIMES A WEEK. 42.5 g 3  . fluticasone (FLONASE) 50 MCG/ACT nasal spray USE 2 SPRAYS IN EACH NOSTRIL ONCE DAILY 16 g 0  . halobetasol (ULTRAVATE) 0.05 % cream Apply topically 2 (two) times daily. 50 g 1  . ipratropium (ATROVENT) 0.06 % nasal spray Place 1 spray into the nose 3 (three) times daily as needed for rhinitis. 15 mL 0  . loratadine (CLARITIN) 10 MG tablet Take 10 mg by mouth daily.    [name] LORazepam (ATIVAN) 1 MG tablet Take 1-2 tablets (1-2 mg total) by mouth 2 (two) times daily as needed for anxiety. 100 tablet 3  . montelukast (SINGULAIR) 10 MG tablet Take 10 mg by mouth at bedtime.     . Phosphatidylserine-DHA-EPA (VAYACOG) 100-19.5-6.5 MG CAPS Take 1 capsule by mouth daily. 90 capsule 3  . SALINE NASAL SPRAY NA Place into the nose.    . simvastatin (ZOCOR) 40 MG  tablet TAKE 1 TABLET ONCE DAILY. 90 tablet 3  . SYNTHROID 50 MCG tablet TAKE 1 TABLET ONCE DAILY BEFORE BREAKFAST. 30 tablet 11  . triamcinolone ointment (KENALOG) 0.5 % Apply 1 application topically 3 (three) times daily. 30 g 0  . VESICARE 10 MG tablet Take 1 tablet by mouth every other day.      Facility-Administered Medications Prior to Visit  Medication Dose Route Frequency Provider Last Rate Last Dose  . triamcinolone acetonide (KENALOG) 10 MG/ML injection 10 mg  10 mg Other Once [name], [name]        ROS Review of Systems  Constitutional: Positive for fatigue. Negative for activity change, appetite change, chills and unexpected weight change.  HENT: Negative for congestion, mouth sores and sinus pressure.   Eyes: Negative for visual disturbance.  Respiratory: Negative for cough and chest tightness.   Gastrointestinal: Negative for abdominal pain and nausea.  Genitourinary: Negative for difficulty urinating, frequency and vaginal pain.  Musculoskeletal: Negative for back pain and gait problem.  Skin: Negative for pallor and rash.  Neurological: Negative for dizziness, tremors, weakness, numbness and headaches.  Psychiatric/Behavioral: Negative for confusion and sleep disturbance.    Objective:  BP 128/70   Pulse 85   Temp 97.9 F (36.6 C) (Oral)   Resp 20  SpO2 95%   BP Readings from Last 3 Encounters:  01/05/17 128/70  11/10/16 118/60  08/18/16 130/72    Wt Readings from Last 3 Encounters:  08/12/16 127 lb (57.6 kg)  01/28/16 127 lb (57.6 kg)  02/19/15 124 lb (56.2 kg)    Physical Exam  Constitutional: She appears well-developed. No distress.  HENT:  Head: Normocephalic.  Right Ear: External ear normal.  Left Ear: External ear normal.  Nose: Nose normal.  Mouth/Throat: Oropharynx is clear and moist.  Eyes: Conjunctivae are normal. Pupils are equal, round, and reactive to light. Right eye exhibits no discharge. Left eye exhibits no discharge.  Neck:  Normal range of motion. Neck supple. No JVD present. No tracheal deviation present. No thyromegaly present.  Cardiovascular: Normal rate, regular rhythm and normal heart sounds.   Pulmonary/Chest: No stridor. No respiratory distress. She has no wheezes.  Abdominal: Soft. Bowel sounds are normal. She exhibits no distension and no mass. There is no tenderness. There is no rebound and no guarding.  Musculoskeletal: She exhibits no edema or tenderness.  Lymphadenopathy:    She has no cervical adenopathy.  Neurological: She displays normal reflexes. No cranial nerve deficit. She exhibits normal muscle tone. Coordination normal.  Skin: No rash noted. No erythema.  Psychiatric: She has a normal mood and affect. Her behavior is normal. Judgment and thought content normal.    Lab Results  Component Value Date   WBC 6.0 11/01/2016   HGB 13.2 11/01/2016   HCT 39.4 11/01/2016   PLT 294.0 11/01/2016   GLUCOSE 99 11/01/2016   CHOL 168 11/01/2016   TRIG 114.0 11/01/2016   HDL 47.80 11/01/2016   LDLCALC 97 11/01/2016   ALT 16 11/01/2016   AST 17 11/01/2016   NA 137 11/01/2016   K 4.7 11/01/2016   CL 102 11/01/2016   CREATININE 0.76 11/01/2016   BUN 19 11/01/2016   CO2 28 11/01/2016   TSH 1.74 11/01/2016   HGBA1C 6.0 07/08/2016    US Soft Tissue Head/neck  Result Date: 08/13/2016 CLINICAL DATA:  81 year old female with chronic hypothyroidism on thyroid hormone replacement therapy. History of thyroid nodules. EXAM: THYROID ULTRASOUND TECHNIQUE: Ultrasound examination of the thyroid gland and adjacent soft tissues was performed. COMPARISON:  Prior thyroid ultrasound 08/05/2015 and 08/24/2011 FINDINGS: Parenchymal Echotexture: Markedly heterogenous Estimated total number of nodules >/= 1 cm: 0 Number of spongiform nodules > 2 cm not described below (TR1): 0 Number of mixed cystic and solid nodules > 1.5 cm not described below ([place]): 0 _________________________________________________________  Isthmus: 0.2 cm No discrete nodules are identified within the thyroid isthmus. _________________________________________________________ Right lobe: 3.2 x 1.3 x 1.3 cm Diffusely heterogeneous thyroid parenchyma. Small isoechoic nodule versus pseudo nodule in the lower pole does not meet consensus criteria for dedicated follow-up or biopsy. _________________________________________________________ Left lobe: 4.1 x 1.4 x 1.1 cm Diffusely heterogeneous thyroid parenchyma. Tiny hypoechoic nodule in the upper gland is insignificantly changed compared to prior and requires no follow-up. IMPRESSION: Diffusely heterogeneous thyroid gland consistent with the clinical history of chronic hypothyroidism and thyroid hormone replacement therapy. Incidentally noted tiny thyroid nodule remains unchanged and does not meet consensus criteria for dedicated imaging follow-up. The above is in keeping with the ACR TI-RADS recommendations - J Am Coll Radiol 2017;14:587-595. Electronically Signed   By: Jacqulynn Cadet M.D.   On: 08/13/2016 16:23   Ct Maxillofacial Wo Cm  Result Date: 08/13/2016 CLINICAL DATA:  Chronic maxillary sinusitis. EXAM: CT MAXILLOFACIAL WITHOUT CONTRAST TECHNIQUE: Multidetector CT imaging of  the maxillofacial structures was performed. Multiplanar CT image reconstructions were also generated. A small metallic BB was placed on the right temple in order to reliably differentiate right from left. COMPARISON:  None. FINDINGS: Osseous: No fracture or mandibular dislocation. No destructive process. Bilateral facet arthropathy at C2-3. Orbits: Negative. No traumatic or inflammatory finding. Sinuses: No air-fluid level. Tiny mucous retention cyst in the left maxillary sinus. Otherwise clear paranasal sinuses. Patent ostiomeatal complexes. Soft tissues: Negative. Limited intracranial: No significant or unexpected finding. IMPRESSION: 1. No evidence of sinusitis. Electronically Signed   By: Kathreen Devoid   On: 08/13/2016  15:23    Assessment & Plan:   There are no diagnoses linked to this encounter. I am having Ms. Causey maintain her aspirin, calcium-vitamin D, SALINE NASAL SPRAY NA, albuterol, ipratropium, EPIPEN 2-PAK, montelukast, amoxicillin, loratadine, VESICARE, fluticasone, halobetasol, budesonide-formoterol, SYNTHROID, simvastatin, triamcinolone ointment, ESTRACE VAGINAL, DEXILANT, LORazepam, Cholecalciferol, Phosphatidylserine-DHA-EPA, and azithromycin. We will continue to administer triamcinolone acetonide.  No orders of the defined types were placed in this encounter.    Follow-up: No Follow-up on file.  Walker Kehr, MD

## 2017-01-05 NOTE — Assessment & Plan Note (Signed)
BP Readings from Last 3 Encounters:  01/05/17 128/70  11/10/16 118/60  08/18/16 130/72

## 2017-01-10 ENCOUNTER — Telehealth: Payer: Self-pay | Admitting: Gynecology

## 2017-01-10 ENCOUNTER — Encounter: Payer: Self-pay | Admitting: Gynecology

## 2017-01-10 ENCOUNTER — Telehealth: Payer: Self-pay | Admitting: *Deleted

## 2017-01-10 ENCOUNTER — Ambulatory Visit (INDEPENDENT_AMBULATORY_CARE_PROVIDER_SITE_OTHER): Payer: Medicare Other | Admitting: Gynecology

## 2017-01-10 VITALS — BP 118/76

## 2017-01-10 DIAGNOSIS — N3090 Cystitis, unspecified without hematuria: Secondary | ICD-10-CM | POA: Diagnosis not present

## 2017-01-10 LAB — URINALYSIS W MICROSCOPIC + REFLEX CULTURE
Bilirubin Urine: NEGATIVE
Casts: NONE SEEN [LPF]
Crystals: NONE SEEN [HPF]
Glucose, UA: NEGATIVE
Hgb urine dipstick: NEGATIVE
Ketones, ur: NEGATIVE
Nitrite: NEGATIVE
Protein, ur: NEGATIVE
RBC / HPF: NONE SEEN RBC/HPF (ref <=2)
Specific Gravity, Urine: 1.02 (ref 1.001–1.035)
Yeast: NONE SEEN [HPF]
pH: 5.5 (ref 5.0–8.0)

## 2017-01-10 MED ORDER — SULFAMETHOXAZOLE-TRIMETHOPRIM 800-160 MG PO TABS: 1.0000 | ORAL_TABLET | Freq: Two times a day (BID) | ORAL | 0 refills | Status: DC

## 2017-01-10 NOTE — Addendum Note (Signed)
Addended by: Nelva Nay on: 01/10/2017 03:50 PM   Modules accepted: Orders

## 2017-01-10 NOTE — Patient Instructions (Signed)
Take the antibiotic twice daily for 3 days.  Follow-up if your symptoms persist, worsen or recur. 

## 2017-01-10 NOTE — Telephone Encounter (Signed)
Pt called c/o frequent urination asked if she could come by to leave a u/a leaving out of town on Thursday. I advised patient OV recommended, pt transferred to front desk.

## 2017-01-10 NOTE — Progress Notes (Signed)
    [name] 04-Nov-1936 [id number]        80 y.o.  [id number] presents with 2 days of urinary frequency and mild pressure symptoms. No urgency dysuria low back pain fever or chills. No nausea vomiting diarrhea constipation. Leaving for [place] this week and wanted to be checked to make sure she does not have an infection.  Past medical history,surgical history, problem list, medications, allergies, family history and social history were all reviewed and documented in the EPIC chart.  Directed ROS with pertinent positives and negatives documented in the history of present illness/assessment and plan.  Exam: Vitals:   01/10/17 1458  BP: 118/76   General appearance:  Normal Spine straight without CVA tenderness Abdomen soft nontender without masses guarding rebound  Assessment/Plan:  81 y.o. [id number] with symptoms to suggest a very early cystitis. Urinalysis shows 6-10 WBC and few bacteria. Also does show 10-20 squamous which may mean contamination. Given that she is leaving town this week to [place] going to cover her for early UTI with Septra DS 1 by mouth twice a day 3 days. Follow up if symptoms persist, worsen or recur. We'll culture for completeness.    Anastasio Auerbach MD, 3:06 PM 01/10/2017

## 2017-01-11 LAB — URINE CULTURE: Organism ID, Bacteria: NO GROWTH

## 2017-01-11 NOTE — Telephone Encounter (Signed)
Prolia due after 02/02/17. Complete exam with TF 03/2016. Calcium 8.9  11/01/16 PC to pt told her I would call back after insurance benefits are checked.

## 2017-01-14 ENCOUNTER — Telehealth (HOSPITAL_COMMUNITY): Payer: Self-pay | Admitting: Internal Medicine

## 2017-01-14 NOTE — Telephone Encounter (Signed)
Left VM for patient to call back.  We need to reschedule 02/16/17 appt with Dr. Haroldine Laws and Echo, MD will be out of the office

## 2017-01-24 ENCOUNTER — Encounter: Payer: Self-pay | Admitting: Internal Medicine

## 2017-01-24 NOTE — Telephone Encounter (Signed)
Called and spoke with patient, she is aware Dr. Haroldine Laws is out of the office on 02/16/17 and her appt for Echo & f/u with Dr. Haroldine Laws have been rescheduled to 03/01/17.

## 2017-01-25 DIAGNOSIS — J4520 Mild intermittent asthma, uncomplicated: Secondary | ICD-10-CM | POA: Diagnosis not present

## 2017-01-25 DIAGNOSIS — J3089 Other allergic rhinitis: Secondary | ICD-10-CM | POA: Diagnosis not present

## 2017-01-25 DIAGNOSIS — J0100 Acute maxillary sinusitis, unspecified: Secondary | ICD-10-CM | POA: Diagnosis not present

## 2017-01-25 DIAGNOSIS — T63441A Toxic effect of venom of bees, accidental (unintentional), initial encounter: Secondary | ICD-10-CM | POA: Diagnosis not present

## 2017-01-31 NOTE — Telephone Encounter (Signed)
Insurance benefit  Deductible  [phone] (met),  Co insurance 20% of medicare allowable.  Secondary insurance coverage 100%  Cost pt $0.   Appointment  March 14 at 11am. Nurse

## 2017-02-02 ENCOUNTER — Encounter: Payer: Self-pay | Admitting: Gynecology

## 2017-02-09 ENCOUNTER — Ambulatory Visit (INDEPENDENT_AMBULATORY_CARE_PROVIDER_SITE_OTHER): Payer: Medicare Other | Admitting: Anesthesiology

## 2017-02-09 ENCOUNTER — Ambulatory Visit: Payer: Medicare Other

## 2017-02-09 DIAGNOSIS — M810 Age-related osteoporosis without current pathological fracture: Secondary | ICD-10-CM

## 2017-02-09 MED ORDER — DENOSUMAB 60 MG/ML ~~LOC~~ SOLN
60.0000 mg | Freq: Once | SUBCUTANEOUS | Status: AC
  Administered 2017-02-09: 60 mg via SUBCUTANEOUS

## 2017-02-10 ENCOUNTER — Ambulatory Visit (INDEPENDENT_AMBULATORY_CARE_PROVIDER_SITE_OTHER): Payer: Medicare Other | Admitting: *Deleted

## 2017-02-10 VITALS — BP 126/62 | HR 72 | Resp 20 | Ht 61.0 in

## 2017-02-10 DIAGNOSIS — Z0000 Encounter for general adult medical examination without abnormal findings: Secondary | ICD-10-CM | POA: Diagnosis not present

## 2017-02-10 NOTE — Progress Notes (Addendum)
Subjective:   [name] is a 81 y.o. female who presents for Medicare Annual (Subsequent) preventive examination.  Review of Systems:  No ROS.  Medicare Wellness Visit.  Cardiac Risk Factors include: advanced age (>87men, >27 women);hypertension Sleep patterns: no sleep issues, feels rested on waking, gets up 1-2 times nightly to void and sleeps 8 hours nightly.   Home Safety/Smoke Alarms: Feels safe in home. Smoke alarms in place.     Living environment; residence and Firearm Safety: 2-story house, no firearms. Lives with husband Seat Belt Safety/Bike Helmet: Wears seat belt.   Counseling:   Eye Exam- goes yearly Dental- every 3 months  Female:   Pap- Last 03/26/15, normal       Mammo- Patient reports she goes yearly 07/24/14 BI-RADS CATEGORY  2: Benign.      Dexa scan- Last 06/08/16, osteoporosis      CCS- Last 06/28/12, normal, follow-up with GI as needed       Objective:     Vitals: BP 126/62   Pulse 72   Resp 20   Ht 5\' 1"  (1.549 m)   SpO2 98%   There is no height or weight on file to calculate BMI.   Tobacco History  Smoking Status  . Former Smoker  Smokeless Tobacco  . Never Used     Counseling given: Not Answered   Past Medical History:  Diagnosis Date  . Allergic rhinitis   . Atrophic vaginitis   . Baker's cyst    Left-Dr. Aluisio  . Cataract    Dr. Katy Fitch  . Colon polyps   . Fibroid   . Gastritis    chronic  . GERD (gastroesophageal reflux disease)   . Heart murmur   . Hemorrhoids   . Hyperlipidemia   . IBS (irritable bowel syndrome)   . MVP (mitral valve prolapse)    Antibiotics required for dental procedures  . OA (osteoarthritis)   . Osteoporosis 05/2014   T score distal third radius -2.5  Spine, left and right hip all show statistically significant improvement.  . Overactive bladder   . Thyroid disease   . Thyroid nodule   . Torn meniscus    bilateral   Past Surgical History:  Procedure Laterality Date  . CATARACT EXTRACTION,  BILATERAL    . TONSILLECTOMY     Family History  Problem Relation Age of Onset  . Heart disease Mother   . Hypertension Mother   . Osteoporosis Mother   . Congestive Heart Failure Mother   . Pancreatic cancer Father   . Heart attack Brother   . Colon cancer Neg Hx   . Colon polyps Neg Hx   . Rectal cancer Neg Hx   . Stomach cancer Neg Hx    History  Sexual Activity  . Sexual activity: Yes  . Birth control/ protection: Post-menopausal    Comment: 1st intercourse 81 yo-Fewer than 5 partners    Outpatient Encounter Prescriptions as of 02/10/2017  Medication Sig  . albuterol (PROVENTIL HFA;VENTOLIN HFA) 108 (90 BASE) MCG/ACT inhaler Inhale 2 puffs into the lungs every 6 (six) hours as needed for wheezing.  [name] amoxicillin (AMOXIL) 500 MG capsule Reported on 06/11/2016  . aspirin 81 MG tablet Take 81 mg by mouth daily.    . Calcium Carbonate-Vitamin D (CALCIUM-VITAMIN D) 500-200 MG-UNIT per tablet Take 3 tablets by mouth daily.   . Cholecalciferol 4000 units TABS Take 1 tablet by mouth daily.  [name] DEXILANT 60 MG capsule TAKE (1) CAPSULE DAILY.  [name]  EPIPEN 2-PAK 0.3 MG/0.3ML SOAJ injection Inject 0.3 mg as directed as needed.   [name] ESTRACE VAGINAL 0.1 MG/GM vaginal cream PLACE 1 APPLICATORFUL VAGINALLY 3 TIMES A WEEK.  . fexofenadine (ALLEGRA) 180 MG tablet Take 180 mg by mouth.  . fluticasone (FLONASE) 50 MCG/ACT nasal spray USE 2 SPRAYS IN EACH NOSTRIL ONCE DAILY  . ipratropium (ATROVENT) 0.06 % nasal spray Place 1 spray into the nose 3 (three) times daily as needed for rhinitis.  [name] LORazepam (ATIVAN) 1 MG tablet Take 1-2 tablets (1-2 mg total) by mouth 2 (two) times daily as needed for anxiety.  . montelukast (SINGULAIR) 10 MG tablet Take 10 mg by mouth at bedtime.   . Phosphatidylserine-DHA-EPA (VAYACOG) 100-19.5-6.5 MG CAPS Take 1 capsule by mouth daily.  [name] SALINE NASAL SPRAY NA Place into the nose.  . simvastatin (ZOCOR) 40 MG tablet TAKE 1 TABLET ONCE DAILY.  . SYNTHROID 50 MCG tablet  TAKE 1 TABLET ONCE DAILY BEFORE BREAKFAST.  . VESICARE 10 MG tablet Take 1 tablet by mouth every other day.   . [DISCONTINUED] azithromycin (ZITHROMAX Z-PAK) 250 MG tablet As directed (Patient not taking: Reported on 01/10/2017)  . [DISCONTINUED] budesonide-formoterol (SYMBICORT) 80-4.5 MCG/ACT inhaler Inhale 2 puffs into the lungs 2 (two) times daily. (Patient not taking: Reported on 02/10/2017)  . [DISCONTINUED] fexofenadine (ALLEGRA) 30 MG tablet Take 30 mg by mouth 2 (two) times daily.  . [DISCONTINUED] halobetasol (ULTRAVATE) 0.05 % cream Apply topically 2 (two) times daily. (Patient not taking: Reported on 01/10/2017)  . [DISCONTINUED] loratadine (CLARITIN) 10 MG tablet Take 10 mg by mouth daily.  . [DISCONTINUED] sulfamethoxazole-trimethoprim (BACTRIM DS,SEPTRA DS) 800-160 MG tablet Take 1 tablet by mouth 2 (two) times daily. (Patient not taking: Reported on 02/10/2017)  . [DISCONTINUED] triamcinolone ointment (KENALOG) 0.5 % Apply 1 application topically 3 (three) times daily. (Patient not taking: Reported on 02/10/2017)   Facility-Administered Encounter Medications as of 02/10/2017  Medication  . triamcinolone acetonide (KENALOG) 10 MG/ML injection 10 mg    Activities of Daily Living In your present state of health, do you have any difficulty performing the following activities: 02/10/2017  Hearing? N  Vision? N  Difficulty concentrating or making decisions? N  Walking or climbing stairs? N  Dressing or bathing? N  Doing errands, shopping? N  Preparing Food and eating ? N  Using the Toilet? N  In the past six months, have you accidently leaked urine? N  Do you have problems with loss of bowel control? N  Managing your Medications? N  Managing your Finances? N  Housekeeping or managing your Housekeeping? N  Some recent data might be hidden    Patient Care Team: Cassandria Anger, MD as PCP - Livengood, MD (Inactive) (Obstetrics and Gynecology) Baley Shipper, MD  (Gastroenterology) Jolaine Artist, MD (Cardiology)    Assessment:    Physical assessment deferred to PCP.  Exercise Activities and Dietary recommendations Current Exercise Habits: Structured exercise class, Type of exercise: strength training/weights;yoga, Time (Minutes): 45, Frequency (Times/Week): 6, Weekly Exercise (Minutes/Week): 270, Intensity: Moderate, Exercise limited by: None identified  Diet (meal preparation, eat out, water intake, caffeinated beverages, dairy products, fruits and vegetables): in general, a "healthy" diet  , well balanced  Drinks 4-5 bottles of water per day.     Goals    . maintain current health status          Increase vegetable and fiber intake.      Fall Risk Fall Risk  02/10/2017 08/03/2016 07/07/2016 02/10/2015 10/02/2013  Falls in the past year? Yes No Yes Yes No  Number falls in past yr: - - 1 1 -  Injury with Fall? No - No Yes -   Depression Screen PHQ 2/9 Scores 02/10/2017 07/07/2016 02/10/2015 10/02/2013  PHQ - 2 Score 0 0 0 0     Cognitive Function MMSE - Mini Mental State Exam 02/10/2017  Orientation to time 5  Orientation to Place 5  Registration 3  Attention/ Calculation 5  Recall 2  Language- name 2 objects 2  Language- repeat 1  Language- follow 3 step command 3  Language- read & follow direction 1  Write a sentence 1  Copy design 1  Total score 29        Immunization History  Administered Date(s) Administered  . Influenza Whole 09/29/2007, 08/21/2008, 09/02/2010, 07/30/2012  . Influenza, High Dose Seasonal PF 09/03/2013, 08/06/2016  . Influenza,inj,Quad PF,36+ Mos 12/17/2014, 07/30/2015  . Pneumococcal Conjugate-13 01/09/2014  . Pneumococcal Polysaccharide-23 09/26/2006, 08/12/2015  . Td 04/14/2010  . Typhoid Inactivated 02/23/2013  . Zoster 12/09/2006   Screening Tests Health Maintenance  Topic Date Due  . PAP SMEAR  03/31/2018  . TETANUS/TDAP  04/14/2020  . INFLUENZA VACCINE  Addressed  . DEXA SCAN  Completed    . PNA vac Low Risk Adult  Completed      Plan:      Continue to eat heart healthy diet (full of fruits, vegetables, whole grains, lean protein, water--limit salt, fat, and sugar intake) and increase physical activity as tolerated.  Continue doing brain stimulating activities (puzzles, reading, adult coloring books, staying active) to keep memory sharp.   During the course of the visit the patient was educated and counseled about the following appropriate screening and preventive services:   Vaccines to include Pneumoccal, Influenza, Hepatitis B, Td, Zostavax, HCV  Cardiovascular Disease  Colorectal cancer screening  Bone density screening  Diabetes screening  Glaucoma screening  Mammography/PAP  Nutrition counseling   Patient Instructions (the written plan) was given to the patient.   Michiel Cowboy, RN  02/10/2017  Medical screening examination/treatment/procedure(s) were performed by non-physician practitioner and as supervising physician I was immediately available for consultation/collaboration. I agree with above. Walker Kehr, MD

## 2017-02-10 NOTE — Patient Instructions (Addendum)
Continue to eat heart healthy diet (full of fruits, vegetables, whole grains, lean protein, water--limit salt, fat, and sugar intake) and increase physical activity as tolerated.  Continue doing brain stimulating activities (puzzles, reading, adult coloring books, staying active) to keep memory sharp.    [name] , Thank you for taking time to come for your Medicare Wellness Visit. I appreciate your ongoing commitment to your health goals. Please review the following plan we discussed and let me know if I can assist you in the future.   These are the goals we discussed: Goals    . maintain current health status          Increase vegetable and fiber intake.       This is a list of the screening recommended for you and due dates:  Health Maintenance  Topic Date Due  . Pap Smear  03/31/2018  . Tetanus Vaccine  04/14/2020  . Flu Shot  Addressed  . DEXA scan (bone density measurement)  Completed  . Pneumonia vaccines  Completed    High-Fiber Diet Fiber, also called dietary fiber, is a type of carbohydrate found in fruits, vegetables, whole grains, and beans. A high-fiber diet can have many health benefits. Your health care provider may recommend a high-fiber diet to help:  Prevent constipation. Fiber can make your bowel movements more regular.  Lower your cholesterol.  Relieve hemorrhoids, uncomplicated diverticulosis, or irritable bowel syndrome.  Prevent overeating as part of a weight-loss plan.  Prevent heart disease, type 2 diabetes, and certain cancers. What is my plan? The recommended daily intake of fiber includes:  38 grams for men under age 64.  25 grams for men over age 18.  62 grams for women under age 77.  49 grams for women over age 20. You can get the recommended daily intake of dietary fiber by eating a variety of fruits, vegetables, grains, and beans. Your health care provider may also recommend a fiber supplement if it is not possible to get enough fiber  through your diet. What do I need to know about a high-fiber diet?  Fiber supplements have not been widely studied for their effectiveness, so it is better to get fiber through food sources.  Always check the fiber content on thenutrition facts label of any prepackaged food. Look for foods that contain at least 5 grams of fiber per serving.  Ask your dietitian if you have questions about specific foods that are related to your condition, especially if those foods are not listed in the following section.  Increase your daily fiber consumption gradually. Increasing your intake of dietary fiber too quickly may cause bloating, cramping, or gas.  Drink plenty of water. Water helps you to digest fiber. What foods can I eat? Grains  Whole-grain breads. Multigrain cereal. Oats and oatmeal. Brown rice. Barley. Bulgur wheat. [place]. Bran muffins. Popcorn. Rye wafer crackers. Vegetables  Sweet potatoes. Spinach. Kale. Artichokes. Cabbage. Broccoli. Green peas. Carrots. Squash. Fruits  Berries. Pears. Apples. Oranges. Avocados. Prunes and raisins. Dried figs. Meats and Other Protein Sources  [place], kidney, pinto, and soy beans. Split peas. Lentils. Nuts and seeds. Dairy  Fiber-fortified yogurt. Beverages  Fiber-fortified soy milk. Fiber-fortified orange juice. Other  Fiber bars. The items listed above may not be a complete list of recommended foods or beverages. Contact your dietitian for more options.  What foods are not recommended? Grains  White bread. Pasta made with refined flour. White rice. Vegetables  Fried potatoes. Canned vegetables. Well-cooked vegetables. Fruits  Fruit juice. Cooked, strained fruit. Meats and Other Protein Sources  Fatty cuts of meat. Fried Sales executive or fried fish. Dairy  Milk. Yogurt. Cream cheese. Sour cream. Beverages  Soft drinks. Other  Cakes and pastries. Butter and oils. The items listed above may not be a complete list of foods and beverages to avoid.  Contact your dietitian for more information.  What are some tips for including high-fiber foods in my diet?  Eat a wide variety of high-fiber foods.  Make sure that half of all grains consumed each day are whole grains.  Replace breads and cereals made from refined flour or white flour with whole-grain breads and cereals.  Replace white rice with brown rice, bulgur wheat, or millet.  Start the day with a breakfast that is high in fiber, such as a cereal that contains at least 5 grams of fiber per serving.  Use beans in place of meat in soups, salads, or pasta.  Eat high-fiber snacks, such as berries, raw vegetables, nuts, or popcorn. This information is not intended to replace advice given to you by your health care provider. Make sure you discuss any questions you have with your health care provider. Document Released: 11/15/2005 Document Revised: 04/22/2016 Document Reviewed: 04/30/2014 Elsevier Interactive Patient Education  2017 [employer].

## 2017-02-10 NOTE — Progress Notes (Signed)
Pre visit review using our clinic review tool, if applicable. No additional management support is needed unless otherwise documented below in the visit note. 

## 2017-02-16 ENCOUNTER — Encounter (HOSPITAL_COMMUNITY): Payer: Medicare Other | Admitting: Internal Medicine

## 2017-02-16 ENCOUNTER — Other Ambulatory Visit (HOSPITAL_COMMUNITY): Payer: Medicare Other

## 2017-02-21 NOTE — Progress Notes (Signed)
Medical screening examination/treatment/procedure(s) were performed by non-physician practitioner and as supervising physician I was immediately available for consultation/collaboration. I agree with above. Bradleigh Sonnen A Tamaira Ciriello, MD 

## 2017-02-21 NOTE — Telephone Encounter (Signed)
Next  prolia injection due 08/13/17

## 2017-03-01 ENCOUNTER — Ambulatory Visit (HOSPITAL_COMMUNITY)
Admission: RE | Admit: 2017-03-01 | Discharge: 2017-03-01 | Disposition: A | Discharge status: Home / Self Care | Payer: Medicare Other | Source: Ambulatory Visit | Attending: Internal Medicine | Admitting: Internal Medicine

## 2017-03-01 ENCOUNTER — Encounter (HOSPITAL_COMMUNITY): Payer: Self-pay | Admitting: Internal Medicine

## 2017-03-01 ENCOUNTER — Ambulatory Visit (HOSPITAL_BASED_OUTPATIENT_CLINIC_OR_DEPARTMENT_OTHER)
Admission: RE | Admit: 2017-03-01 | Discharge: 2017-03-01 | Disposition: A | Discharge status: Home / Self Care | Payer: Medicare Other | Source: Ambulatory Visit | Attending: Internal Medicine | Admitting: Internal Medicine

## 2017-03-01 VITALS — BP 128/64 | HR 74

## 2017-03-01 DIAGNOSIS — E785 Hyperlipidemia, unspecified: Secondary | ICD-10-CM | POA: Insufficient documentation

## 2017-03-01 DIAGNOSIS — I358 Other nonrheumatic aortic valve disorders: Secondary | ICD-10-CM

## 2017-03-01 DIAGNOSIS — Z7951 Long term (current) use of inhaled steroids: Secondary | ICD-10-CM | POA: Insufficient documentation

## 2017-03-01 DIAGNOSIS — Z7982 Long term (current) use of aspirin: Secondary | ICD-10-CM | POA: Insufficient documentation

## 2017-03-01 DIAGNOSIS — Z79899 Other long term (current) drug therapy: Secondary | ICD-10-CM | POA: Diagnosis not present

## 2017-03-01 DIAGNOSIS — K219 Gastro-esophageal reflux disease without esophagitis: Secondary | ICD-10-CM | POA: Insufficient documentation

## 2017-03-01 DIAGNOSIS — E782 Mixed hyperlipidemia: Secondary | ICD-10-CM

## 2017-03-01 DIAGNOSIS — I341 Nonrheumatic mitral (valve) prolapse: Secondary | ICD-10-CM

## 2017-03-01 DIAGNOSIS — M810 Age-related osteoporosis without current pathological fracture: Secondary | ICD-10-CM | POA: Insufficient documentation

## 2017-03-01 DIAGNOSIS — K589 Irritable bowel syndrome without diarrhea: Secondary | ICD-10-CM | POA: Diagnosis not present

## 2017-03-01 DIAGNOSIS — M1990 Unspecified osteoarthritis, unspecified site: Secondary | ICD-10-CM | POA: Diagnosis not present

## 2017-03-01 DIAGNOSIS — E079 Disorder of thyroid, unspecified: Secondary | ICD-10-CM | POA: Diagnosis not present

## 2017-03-01 DIAGNOSIS — I10 Essential (primary) hypertension: Secondary | ICD-10-CM

## 2017-03-01 DIAGNOSIS — I080 Rheumatic disorders of both mitral and aortic valves: Secondary | ICD-10-CM | POA: Insufficient documentation

## 2017-03-01 NOTE — Patient Instructions (Signed)
Follow up and Echo with Dr.Bensimhon in 12 months

## 2017-03-01 NOTE — Progress Notes (Signed)
  Echocardiogram 2D Echocardiogram has been performed.  Bobbye Charleston 03/01/2017, 12:08 PM

## 2017-03-01 NOTE — Progress Notes (Signed)
Patient ID: [name], female   DOB: January 28, 1936, 81 y.o.   MRN: [id number]  HPI:  [name] is a 81 year-old woman with a history of hyperlipidemia, mild mitral valve prolapse with mild mitral regurgitation (echo 12/11).  She has undergone CT angiogram at the [facility] about 10 years ago which was totally normal.   She returns today for routine followup.  Still exercising with trainer. Doing Pilates. No CP, SOB or edema. BP well controlled. Was having stomach problems so simva stopped. Cholesterol levels came back and was up and simva restarted.   Echo today (I reviewed personally) EF 55%Trivial MVP Mild MR Calcified Aov Moves well. Mild AI. No AS  REcent lipids 12/17  Lipid Panel     Component Value Date/Time   CHOL 168 11/01/2016 1102   TRIG 114.0 11/01/2016 1102   TRIG 105 12/05/2006 0740   HDL 47.80 11/01/2016 1102   CHOLHDL 4 11/01/2016 1102   VLDL 22.8 11/01/2016 1102   LDLCALC 97 11/01/2016 1102     Lab Results  Component Value Date   CHOL 168 11/01/2016   HDL 47.80 11/01/2016   LDLCALC 97 11/01/2016   TRIG 114.0 11/01/2016   CHOLHDL 4 11/01/2016    ROS: All systems negative except as listed in HPI, PMH and Problem List.  Past Medical History:  Diagnosis Date  . Allergic rhinitis   . Atrophic vaginitis   . Baker's cyst    Left-Dr. Aluisio  . Cataract    Dr. Dione Booze  . Colon polyps   . Fibroid   . Gastritis    chronic  . GERD (gastroesophageal reflux disease)   . Heart murmur   . Hemorrhoids   . Hyperlipidemia   . IBS (irritable bowel syndrome)   . MVP (mitral valve prolapse)    Antibiotics required for dental procedures  . OA (osteoarthritis)   . Osteoporosis 05/2014   T score distal third radius -2.5  Spine, left and right hip all show statistically significant improvement.  . Overactive bladder   . Thyroid disease   . Thyroid nodule   . Torn meniscus    bilateral    Current Outpatient Prescriptions  Medication Sig Dispense Refill  . albuterol  (PROVENTIL HFA;VENTOLIN HFA) 108 (90 BASE) MCG/ACT inhaler Inhale 2 puffs into the lungs every 6 (six) hours as needed for wheezing. 1 Inhaler 3  . aspirin 81 MG tablet Take 81 mg by mouth daily.      . Calcium Carbonate-Vitamin D (CALCIUM-VITAMIN D) 500-200 MG-UNIT per tablet Take 3 tablets by mouth daily.     . Cholecalciferol 4000 units TABS Take 1 tablet by mouth daily.    [name] DEXILANT 60 MG capsule TAKE (1) CAPSULE DAILY. 30 capsule 6  . ESTRACE VAGINAL 0.1 MG/GM vaginal cream PLACE 1 APPLICATORFUL VAGINALLY 3 TIMES A WEEK. 42.5 g 3  . fexofenadine (ALLEGRA) 180 MG tablet Take 180 mg by mouth.    . fluticasone (FLONASE) 50 MCG/ACT nasal spray USE 2 SPRAYS IN EACH NOSTRIL ONCE DAILY 16 g 0  . ipratropium (ATROVENT) 0.06 % nasal spray Place 1 spray into the nose 3 (three) times daily as needed for rhinitis. 15 mL 0  . LORazepam (ATIVAN) 1 MG tablet Take 1-2 tablets (1-2 mg total) by mouth 2 (two) times daily as needed for anxiety. 100 tablet 3  . montelukast (SINGULAIR) 10 MG tablet Take 10 mg by mouth at bedtime.     . Phosphatidylserine-DHA-EPA (VAYACOG) 100-19.5-6.5 MG CAPS Take 1 capsule by  mouth daily. 90 capsule 3  . SALINE NASAL SPRAY NA Place into the nose.    . simvastatin (ZOCOR) 40 MG tablet TAKE 1 TABLET ONCE DAILY. 90 tablet 3  . SYNTHROID 50 MCG tablet TAKE 1 TABLET ONCE DAILY BEFORE BREAKFAST. 30 tablet 11  . VESICARE 10 MG tablet Take 1 tablet by mouth every other day.     [name] amoxicillin (AMOXIL) 500 MG capsule Reported on 06/11/2016    . EPIPEN 2-PAK 0.3 MG/0.3ML SOAJ injection Inject 0.3 mg as directed as needed.      Current Facility-Administered Medications  Medication Dose Route Frequency Provider Last Rate Last Dose  . triamcinolone acetonide (KENALOG) 10 MG/ML injection 10 mg  10 mg Other Once Lenn Sink, [name]         PHYSICAL EXAM: Vitals:   03/01/17 1206  BP: 128/64  Pulse: 74   General:  Well appearing. No resp difficulty HEENT: normal Neck: supple. no  JVD. Carotids 2+ bilat; no bruits. No lymphadenopathy or thryomegaly appreciated. Cor: PMI nondisplaced. Regular rate & rhythm. No rubs, gallops or murmurs. Lungs: clear Abdomen: soft, nontender, nondistended. No hepatosplenomegaly. No bruits or masses. Good bowel sounds. Extremities: no cyanosis, clubbing, rash, edema Neuro: alert & orientedx3, cranial nerves grossly intact. moves all 4 extremities w/o difficulty. Affect pleasant   ASSESSMENT & PLAN: 1. Aortic valve calcification without AS. Now with mild AI 2. MVP with mild MR 3. Hyperlipidemia, well controlled  Overall doing very well. Remains very active and looks younger than her 81 years. Lipids improved. I reviewed echo with her today and her valvular disease is stable. Continue watchful waiting and yearly surveillance echos.   Joelyn Lover,MD 12:17 PM

## 2017-03-09 ENCOUNTER — Ambulatory Visit: Payer: Medicare Other | Admitting: Internal Medicine

## 2017-04-01 ENCOUNTER — Encounter: Payer: Self-pay | Admitting: Gynecology

## 2017-04-01 ENCOUNTER — Encounter: Payer: Medicare Other | Admitting: Gynecology

## 2017-04-01 ENCOUNTER — Ambulatory Visit (INDEPENDENT_AMBULATORY_CARE_PROVIDER_SITE_OTHER): Payer: Medicare Other | Admitting: Gynecology

## 2017-04-01 ENCOUNTER — Encounter: Payer: Self-pay | Admitting: Internal Medicine

## 2017-04-01 VITALS — BP 118/74 | Ht 60.5 in | Wt 122.0 lb

## 2017-04-01 DIAGNOSIS — Z01411 Encounter for gynecological examination (general) (routine) with abnormal findings: Secondary | ICD-10-CM

## 2017-04-01 DIAGNOSIS — M810 Age-related osteoporosis without current pathological fracture: Secondary | ICD-10-CM

## 2017-04-01 DIAGNOSIS — Z124 Encounter for screening for malignant neoplasm of cervix: Secondary | ICD-10-CM | POA: Diagnosis not present

## 2017-04-01 DIAGNOSIS — N952 Postmenopausal atrophic vaginitis: Secondary | ICD-10-CM

## 2017-04-01 DIAGNOSIS — R350 Frequency of micturition: Secondary | ICD-10-CM

## 2017-04-01 NOTE — Progress Notes (Signed)
    [name] 1935/12/27 [id number]        81 y.o.  [id number] for breast and pelvic exam.  Past medical history,surgical history, problem list, medications, allergies, family history and social history were all reviewed and documented as reviewed in the EPIC chart.  ROS:  Performed with pertinent positives and negatives included in the history, assessment and plan.   Additional significant findings :  None   Exam: [name] assistant Vitals:   04/01/17 1429  BP: 118/74  Weight: 122 lb (55.3 kg)  Height: 5' 0.5" (1.537 m)   Body mass index is 23.43 kg/m.  General appearance:  Normal affect, orientation and appearance. Skin: Grossly normal HEENT: Without gross lesions.  No cervical or supraclavicular adenopathy. Thyroid normal.  Lungs:  Clear without wheezing, rales or rhonchi Cardiac: RR, without RMG Abdominal:  Soft, nontender, without masses, guarding, rebound, organomegaly or hernia Breasts:  Examined lying and sitting without masses, retractions, discharge or axillary adenopathy. Pelvic:  Ext, BUS, Vagina: With atrophic changes  Cervix: With atrophic changes. Pap smear done  Uterus: Anteverted, normal size, shape and contour, midline and mobile nontender   Adnexa: Without masses or tenderness    Anus and perineum: Normal   Rectovaginal: Normal sphincter tone without palpated masses or tenderness.    Assessment/Plan:  81 y.o. [name] female for breast and pelvic exam  1. Post menopausal/atrophic genital changes. No significant hot flushes, night sweats, vaginal dryness or any vaginal bleeding. Report any issues or bleeding. 2. Osteoporosis. DEXA 2017 T score -3 forearm, -2.5 right femoral neck, -2.2 left femoral neck. History of Fosamax use a number of years ago and has been off for at least 9 years.  Discussed treatment options 06/11/2016. Patient elected to start Actonel but due to pricing switched over to Prolia and is doing well with his having received 2 shots. We'll  continue for now and repeat her bone density next year at 2 year interval. 3. Having some nocturia. Asked if I could check a urinalysis. Has been a chronic issue with her. We'll go ahead and check urinalysis now. Issues of voiding at night time as we age discussed. 4. Mammography 07/2016. Continue with annual mammography when due. SBE monthly reviewed. 5. Colonoscopy 2013. Repeat at their recommended interval. 6. Pap smear 2017. No history of significant abnormal Pap smears. Patient requests annual screening and Pap smear is done. 7. Health maintenance. No routine lab work done as patient does this elsewhere. Follow up for her next Prolia shot when due otherwise annual exam in one year.   Anastasio Auerbach MD, 2:54 PM 04/01/2017

## 2017-04-01 NOTE — Addendum Note (Signed)
Addended by: Nelva Nay on: 04/01/2017 03:03 PM   Modules accepted: Orders

## 2017-04-01 NOTE — Patient Instructions (Signed)
Follow up when you are due for your next Prolia shot.

## 2017-04-02 LAB — URINALYSIS W MICROSCOPIC + REFLEX CULTURE
Bacteria, UA: NONE SEEN [HPF]
Bilirubin Urine: NEGATIVE
Casts: NONE SEEN [LPF]
Crystals: NONE SEEN [HPF]
Glucose, UA: NEGATIVE
Hgb urine dipstick: NEGATIVE
Ketones, ur: NEGATIVE
Leukocytes, UA: NEGATIVE
Nitrite: NEGATIVE
Protein, ur: NEGATIVE
RBC / HPF: NONE SEEN RBC/HPF (ref <=2)
Specific Gravity, Urine: 1.017 (ref 1.001–1.035)
WBC, UA: NONE SEEN WBC/HPF (ref <=5)
Yeast: NONE SEEN [HPF]
pH: 6 (ref 5.0–8.0)

## 2017-04-05 LAB — PAP IG (IMAGE GUIDED)

## 2017-04-06 ENCOUNTER — Other Ambulatory Visit (INDEPENDENT_AMBULATORY_CARE_PROVIDER_SITE_OTHER): Payer: Medicare Other

## 2017-04-06 ENCOUNTER — Other Ambulatory Visit: Payer: Medicare Other

## 2017-04-06 ENCOUNTER — Telehealth: Payer: Self-pay

## 2017-04-06 DIAGNOSIS — E039 Hypothyroidism, unspecified: Secondary | ICD-10-CM | POA: Diagnosis not present

## 2017-04-06 DIAGNOSIS — E785 Hyperlipidemia, unspecified: Secondary | ICD-10-CM

## 2017-04-06 LAB — BASIC METABOLIC PANEL
BUN: 13 mg/dL (ref 6–23)
CO2: 28 mEq/L (ref 19–32)
Calcium: 9.4 mg/dL (ref 8.4–10.5)
Chloride: 100 mEq/L (ref 96–112)
Creatinine, Ser: 0.76 mg/dL (ref 0.40–1.20)
GFR: 78.07 mL/min (ref >60.00)
Glucose, Bld: 104 mg/dL — ABNORMAL HIGH (ref 70–99)
Potassium: 4.2 mEq/L (ref 3.5–5.1)
Sodium: 137 mEq/L (ref 135–145)

## 2017-04-06 LAB — HEPATIC FUNCTION PANEL
ALT: 19 U/L (ref 0–35)
AST: 20 U/L (ref 0–37)
Albumin: 4.4 g/dL (ref 3.5–5.2)
Alkaline Phosphatase: 38 U/L — ABNORMAL LOW (ref 39–117)
Bilirubin, Direct: 0.1 mg/dL (ref 0.0–0.3)
Total Bilirubin: 0.7 mg/dL (ref 0.2–1.2)
Total Protein: 7.5 g/dL (ref 6.0–8.3)

## 2017-04-06 LAB — LIPID PANEL
Cholesterol: 193 mg/dL (ref 0–200)
HDL: 52.9 mg/dL (ref >39.00)
LDL Cholesterol: 109 mg/dL — ABNORMAL HIGH (ref 0–99)
NonHDL: 140.03
Total CHOL/HDL Ratio: 4
Triglycerides: 156 mg/dL — ABNORMAL HIGH (ref 0.0–149.0)
VLDL: 31.2 mg/dL (ref 0.0–40.0)

## 2017-04-06 LAB — TSH: TSH: 2.62 u[IU]/mL (ref 0.35–4.50)

## 2017-04-06 NOTE — Telephone Encounter (Signed)
error 

## 2017-04-07 ENCOUNTER — Encounter: Payer: Self-pay | Admitting: Internal Medicine

## 2017-04-07 ENCOUNTER — Ambulatory Visit (INDEPENDENT_AMBULATORY_CARE_PROVIDER_SITE_OTHER): Payer: Medicare Other | Admitting: Internal Medicine

## 2017-04-07 DIAGNOSIS — E782 Mixed hyperlipidemia: Secondary | ICD-10-CM

## 2017-04-07 DIAGNOSIS — E785 Hyperlipidemia, unspecified: Secondary | ICD-10-CM | POA: Diagnosis not present

## 2017-04-07 DIAGNOSIS — G4451 Hemicrania continua: Secondary | ICD-10-CM

## 2017-04-07 DIAGNOSIS — I10 Essential (primary) hypertension: Secondary | ICD-10-CM

## 2017-04-07 DIAGNOSIS — G4700 Insomnia, unspecified: Secondary | ICD-10-CM

## 2017-04-07 MED ORDER — ZOSTER VAC RECOMB ADJUVANTED 50 MCG/0.5ML IM SUSR: 0.5000 mL | Freq: Once | INTRAMUSCULAR | 1 refills | Status: AC

## 2017-04-07 MED ORDER — LORAZEPAM 1 MG PO TABS: 1.0000 mg | ORAL_TABLET | Freq: Two times a day (BID) | ORAL | 3 refills | Status: DC | PRN

## 2017-04-07 NOTE — Assessment & Plan Note (Signed)
Lorazepam at hs

## 2017-04-07 NOTE — Assessment & Plan Note (Signed)
NAS diet 

## 2017-04-07 NOTE — Progress Notes (Signed)
Subjective:  Patient ID: [name], female    DOB: Dec 30, 1935  Age: 81 y.o. MRN: [id number]  CC: No chief complaint on file.   HPI Kaliah Haddaway presents for anxiety - worried about Irv; HTN, dyslipidemia f/u  Outpatient Medications Prior to Visit  Medication Sig Dispense Refill  . albuterol (PROVENTIL HFA;VENTOLIN HFA) 108 (90 BASE) MCG/ACT inhaler Inhale 2 puffs into the lungs every 6 (six) hours as needed for wheezing. 1 Inhaler 3  . amoxicillin (AMOXIL) 500 MG capsule Reported on 06/11/2016    . aspirin 81 MG tablet Take 81 mg by mouth daily.      . Calcium Carbonate-Vitamin D (CALCIUM-VITAMIN D) 500-200 MG-UNIT per tablet Take 3 tablets by mouth daily.     . Cholecalciferol 4000 units TABS Take 1 tablet by mouth daily.    [name] DEXILANT 60 MG capsule TAKE (1) CAPSULE DAILY. 30 capsule 6  . EPIPEN 2-PAK 0.3 MG/0.3ML SOAJ injection Inject 0.3 mg as directed as needed.     [name] ESTRACE VAGINAL 0.1 MG/GM vaginal cream PLACE 1 APPLICATORFUL VAGINALLY 3 TIMES A WEEK. 42.5 g 3  . fexofenadine (ALLEGRA) 180 MG tablet Take 180 mg by mouth.    . fluticasone (FLONASE) 50 MCG/ACT nasal spray USE 2 SPRAYS IN EACH NOSTRIL ONCE DAILY 16 g 0  . ipratropium (ATROVENT) 0.06 % nasal spray Place 1 spray into the nose 3 (three) times daily as needed for rhinitis. 15 mL 0  . LORazepam (ATIVAN) 1 MG tablet Take 1-2 tablets (1-2 mg total) by mouth 2 (two) times daily as needed for anxiety. 100 tablet 3  . montelukast (SINGULAIR) 10 MG tablet Take 10 mg by mouth at bedtime.     . Phosphatidylserine-DHA-EPA (VAYACOG) 100-19.5-6.5 MG CAPS Take 1 capsule by mouth daily. 90 capsule 3  . SALINE NASAL SPRAY NA Place into the nose.    . simvastatin (ZOCOR) 40 MG tablet TAKE 1 TABLET ONCE DAILY. 90 tablet 3  . SYNTHROID 50 MCG tablet TAKE 1 TABLET ONCE DAILY BEFORE BREAKFAST. 30 tablet 11  . VESICARE 10 MG tablet Take 1 tablet by mouth every other day.      Facility-Administered Medications Prior to Visit  Medication  Dose Route Frequency Provider Last Rate Last Dose  . triamcinolone acetonide (KENALOG) 10 MG/ML injection 10 mg  10 mg Other Once [name], [name]        ROS Review of Systems  Constitutional: Negative for activity change, appetite change, chills, fatigue and unexpected weight change.  HENT: Negative for congestion, mouth sores and sinus pressure.   Eyes: Negative for visual disturbance.  Respiratory: Negative for cough and chest tightness.   Gastrointestinal: Negative for abdominal pain and nausea.  Genitourinary: Negative for difficulty urinating, frequency and vaginal pain.  Musculoskeletal: Negative for back pain and gait problem.  Skin: Negative for pallor and rash.  Neurological: Negative for dizziness, tremors, weakness, numbness and headaches.  Psychiatric/Behavioral: Positive for sleep disturbance. Negative for confusion. The patient is nervous/anxious.     Objective:  BP 118/78 (BP Location: Left Arm, Patient Position: Sitting, Cuff Size: Normal)   Pulse 78   Temp 97.7 F (36.5 C) (Oral)   Ht 5' 0.5" (1.537 m)   Wt 125 lb (56.7 kg)   SpO2 97%   BMI 24.01 kg/m   BP Readings from Last 3 Encounters:  04/07/17 118/78  04/01/17 118/74  03/01/17 128/64    Wt Readings from Last 3 Encounters:  04/07/17 125 lb (56.7 kg)  04/01/17 122 lb (55.3 kg)  08/12/16 127 lb (57.6 kg)    Physical Exam  Constitutional: She appears well-developed. No distress.  HENT:  Head: Normocephalic.  Right Ear: External ear normal.  Left Ear: External ear normal.  Nose: Nose normal.  Mouth/Throat: Oropharynx is clear and moist.  Eyes: Conjunctivae are normal. Pupils are equal, round, and reactive to light. Right eye exhibits no discharge. Left eye exhibits no discharge.  Neck: Normal range of motion. Neck supple. No JVD present. No tracheal deviation present. No thyromegaly present.  Cardiovascular: Normal rate, regular rhythm and normal heart sounds.   Pulmonary/Chest: No stridor. No  respiratory distress. She has no wheezes.  Abdominal: Soft. Bowel sounds are normal. She exhibits no distension and no mass. There is no tenderness. There is no rebound and no guarding.  Musculoskeletal: She exhibits no edema or tenderness.  Lymphadenopathy:    She has no cervical adenopathy.  Neurological: She displays normal reflexes. No cranial nerve deficit. She exhibits normal muscle tone. Coordination normal.  Skin: No rash noted. No erythema.  Psychiatric: She has a normal mood and affect. Her behavior is normal. Judgment and thought content normal.    Lab Results  Component Value Date   WBC 6.0 11/01/2016   HGB 13.2 11/01/2016   HCT 39.4 11/01/2016   PLT 294.0 11/01/2016   GLUCOSE 104 (H) 04/06/2017   CHOL 193 04/06/2017   TRIG 156.0 (H) 04/06/2017   HDL 52.90 04/06/2017   LDLCALC 109 (H) 04/06/2017   ALT 19 04/06/2017   AST 20 04/06/2017   NA 137 04/06/2017   K 4.2 04/06/2017   CL 100 04/06/2017   CREATININE 0.76 04/06/2017   BUN 13 04/06/2017   CO2 28 04/06/2017   TSH 2.62 04/06/2017   HGBA1C 6.0 07/08/2016    No results found.  Assessment & Plan:   There are no diagnoses linked to this encounter. I am having Ms. Scarbrough maintain her aspirin, calcium-vitamin D, SALINE NASAL SPRAY NA, albuterol, ipratropium, EPIPEN 2-PAK, montelukast, amoxicillin, VESICARE, fluticasone, SYNTHROID, simvastatin, ESTRACE VAGINAL, DEXILANT, LORazepam, Cholecalciferol, Phosphatidylserine-DHA-EPA, and fexofenadine. We will continue to administer triamcinolone acetonide.  No orders of the defined types were placed in this encounter.    Follow-up: No Follow-up on file.  Walker Kehr, MD

## 2017-04-07 NOTE — Assessment & Plan Note (Signed)
On Simvastatin 

## 2017-04-07 NOTE — Assessment & Plan Note (Signed)
No relapse 

## 2017-04-11 ENCOUNTER — Encounter: Payer: Self-pay | Admitting: Gynecology

## 2017-04-12 ENCOUNTER — Encounter: Payer: Self-pay | Admitting: Internal Medicine

## 2017-04-13 ENCOUNTER — Encounter: Payer: Self-pay | Admitting: Gynecology

## 2017-04-19 ENCOUNTER — Ambulatory Visit: Payer: Medicare Other | Admitting: Internal Medicine

## 2017-05-26 ENCOUNTER — Other Ambulatory Visit: Payer: Self-pay | Admitting: Internal Medicine

## 2017-05-31 ENCOUNTER — Telehealth: Payer: Self-pay | Admitting: Internal Medicine

## 2017-05-31 ENCOUNTER — Encounter: Payer: Self-pay | Admitting: Internal Medicine

## 2017-05-31 NOTE — Telephone Encounter (Signed)
Pt called in said that she would like Dr plot to call her she said she is not feeling well right now??    That is all she would tell me

## 2017-06-02 DIAGNOSIS — N3000 Acute cystitis without hematuria: Secondary | ICD-10-CM | POA: Diagnosis not present

## 2017-06-02 NOTE — Telephone Encounter (Signed)
Patient sent mychart message, that was forwarded to Dr. Alain Marion.

## 2017-06-08 ENCOUNTER — Encounter: Payer: Self-pay | Admitting: Internal Medicine

## 2017-06-14 ENCOUNTER — Other Ambulatory Visit (INDEPENDENT_AMBULATORY_CARE_PROVIDER_SITE_OTHER): Payer: Medicare Other

## 2017-06-14 ENCOUNTER — Telehealth: Payer: Self-pay | Admitting: Internal Medicine

## 2017-06-14 ENCOUNTER — Encounter: Payer: Self-pay | Admitting: Internal Medicine

## 2017-06-14 DIAGNOSIS — N3000 Acute cystitis without hematuria: Secondary | ICD-10-CM

## 2017-06-14 LAB — URINALYSIS, ROUTINE W REFLEX MICROSCOPIC
Bilirubin Urine: NEGATIVE
Hgb urine dipstick: NEGATIVE
Ketones, ur: NEGATIVE
Leukocytes, UA: NEGATIVE
Nitrite: NEGATIVE
RBC / HPF: NONE SEEN (ref 0–?)
Specific Gravity, Urine: <=1.005 — AB (ref 1.000–1.030)
Total Protein, Urine: NEGATIVE
Urine Glucose: NEGATIVE
Urobilinogen, UA: 0.2 (ref 0.0–1.0)
pH: 6 (ref 5.0–8.0)

## 2017-06-14 NOTE — Telephone Encounter (Signed)
Notified pt through her mychart message MD ok UA has been entered in epicc...[name]

## 2017-06-14 NOTE — Telephone Encounter (Signed)
OK UA and urine Cx Thx

## 2017-06-14 NOTE — Telephone Encounter (Signed)
Patient states she had a visit with her urologist and was given a medication for UTI.  States she had mentioned that to Dr. Camila Li and he asked her to not take medication to see if would improve on its own.  Patient states she is not any better and is requesting Dr. Camila Li to enter an order for her to go to the lab to give a urine specimen to check on infection.

## 2017-06-15 ENCOUNTER — Encounter: Payer: Self-pay | Admitting: Internal Medicine

## 2017-06-20 ENCOUNTER — Encounter: Payer: Self-pay | Admitting: Internal Medicine

## 2017-06-20 NOTE — Telephone Encounter (Signed)
It looks like culture was entered incorrectly to where the lab could not see it so the did not run it.  Please advise

## 2017-06-20 NOTE — Telephone Encounter (Signed)
Pt called regarding her urine culture, she would like the results please call back

## 2017-06-21 ENCOUNTER — Encounter: Payer: Self-pay | Admitting: Internal Medicine

## 2017-06-21 DIAGNOSIS — H00022 Hordeolum internum right lower eyelid: Secondary | ICD-10-CM | POA: Diagnosis not present

## 2017-06-21 DIAGNOSIS — Z961 Presence of intraocular lens: Secondary | ICD-10-CM | POA: Diagnosis not present

## 2017-06-23 DIAGNOSIS — H00022 Hordeolum internum right lower eyelid: Secondary | ICD-10-CM | POA: Diagnosis not present

## 2017-06-30 DIAGNOSIS — H00022 Hordeolum internum right lower eyelid: Secondary | ICD-10-CM | POA: Diagnosis not present

## 2017-07-04 ENCOUNTER — Encounter: Payer: Self-pay | Admitting: Internal Medicine

## 2017-07-12 ENCOUNTER — Other Ambulatory Visit: Payer: Self-pay | Admitting: Internal Medicine

## 2017-07-12 DIAGNOSIS — Z1231 Encounter for screening mammogram for malignant neoplasm of breast: Secondary | ICD-10-CM

## 2017-07-13 DIAGNOSIS — L821 Other seborrheic keratosis: Secondary | ICD-10-CM | POA: Diagnosis not present

## 2017-07-13 DIAGNOSIS — Z85828 Personal history of other malignant neoplasm of skin: Secondary | ICD-10-CM | POA: Diagnosis not present

## 2017-07-13 DIAGNOSIS — D225 Melanocytic nevi of trunk: Secondary | ICD-10-CM | POA: Diagnosis not present

## 2017-07-13 DIAGNOSIS — L723 Sebaceous cyst: Secondary | ICD-10-CM | POA: Diagnosis not present

## 2017-07-13 DIAGNOSIS — Z411 Encounter for cosmetic surgery: Secondary | ICD-10-CM | POA: Diagnosis not present

## 2017-07-20 ENCOUNTER — Telehealth: Payer: Self-pay | Admitting: Gynecology

## 2017-07-20 NOTE — Telephone Encounter (Signed)
Prolia due after 08/13/17 PC to pt told her that I will call her back after benefits are checked.

## 2017-07-21 ENCOUNTER — Telehealth: Payer: Self-pay | Admitting: *Deleted

## 2017-07-21 NOTE — Telephone Encounter (Signed)
Pt called c/o vaginal itching only, asked if Rx could be prescribed for the weekend, pt has appointment scheduled on 07/25/17. I advised pt to use OTC monistat may use externally as well. Pt will try this and follow up on monday

## 2017-07-24 ENCOUNTER — Other Ambulatory Visit: Payer: Self-pay | Admitting: Internal Medicine

## 2017-07-25 ENCOUNTER — Encounter: Payer: Self-pay | Admitting: Gynecology

## 2017-07-25 ENCOUNTER — Other Ambulatory Visit: Payer: Self-pay | Admitting: Internal Medicine

## 2017-07-25 ENCOUNTER — Telehealth: Payer: Self-pay | Admitting: Internal Medicine

## 2017-07-25 ENCOUNTER — Ambulatory Visit (INDEPENDENT_AMBULATORY_CARE_PROVIDER_SITE_OTHER): Payer: Medicare Other | Admitting: Gynecology

## 2017-07-25 VITALS — BP 116/70

## 2017-07-25 DIAGNOSIS — N898 Other specified noninflammatory disorders of vagina: Secondary | ICD-10-CM

## 2017-07-25 DIAGNOSIS — R350 Frequency of micturition: Secondary | ICD-10-CM | POA: Diagnosis not present

## 2017-07-25 LAB — URINALYSIS W MICROSCOPIC + REFLEX CULTURE
Bilirubin Urine: NEGATIVE
Casts: NONE SEEN [LPF]
Crystals: NONE SEEN [HPF]
Glucose, UA: NEGATIVE
Hgb urine dipstick: NEGATIVE
Ketones, ur: NEGATIVE
Leukocytes, UA: NEGATIVE
Nitrite: NEGATIVE
Protein, ur: NEGATIVE
RBC / HPF: NONE SEEN RBC/HPF (ref <=2)
Specific Gravity, Urine: 1.02 (ref 1.001–1.035)
Yeast: NONE SEEN [HPF]
pH: 6 (ref 5.0–8.0)

## 2017-07-25 LAB — WET PREP FOR TRICH, YEAST, CLUE
Clue Cells Wet Prep HPF POC: NONE SEEN
Trich, Wet Prep: NONE SEEN
Yeast Wet Prep HPF POC: NONE SEEN

## 2017-07-25 MED ORDER — NYSTATIN-TRIAMCINOLONE 100000-0.1 UNIT/GM-% EX OINT: 1.0000 "application " | TOPICAL_OINTMENT | Freq: Two times a day (BID) | CUTANEOUS | 1 refills | Status: DC

## 2017-07-25 NOTE — Progress Notes (Signed)
    [name] 09/20/1936 [id number]        81 y.o.  [id number] presents complaining of some vaginal irritation and itching. Had some urinary frequency with some mild dysuria several weeks ago. Left a specimen at her urologist office. Initially was called and told she had staff and they prescribed antibiotic. She was then called back and told not to take the antibiotic. She subsequently saw the PA consider her urine was clear at that point. Since then has felt some coming and going irritation and just not feeling right. Not having urinary frequency, dysuria, urgency, low back pain, fever or chills.  Past medical history,surgical history, problem list, medications, allergies, family history and social history were all reviewed and documented in the EPIC chart.  Directed ROS with pertinent positives and negatives documented in the history of present illness/assessment and plan.  Exam: [name] assistant Vitals:   07/25/17 1525  BP: 116/70   General appearance:  Normal Abdomen soft nontender without masses guarding rebound Pelvic external BUS vagina with atrophic changes. White discharge noted. Cervix atrophic. Uterus normal size midline mobile nontender. Adnexa without masses or tenderness.  Assessment/Plan:  81 y.o. [id number] with the history as above. Wet prep was unremarkable. Urinalysis also was unremarkable. Will follow up with a urine culture. Recommended Mycolog ointment externally twice daily to the irritated areas as needed. We'll treat as a low-grade fungal for now. Patient will follow up if her symptoms persist, worsen or recur. Will follow up on the urine culture results.    Anastasio Auerbach MD, 3:34 PM 07/25/2017

## 2017-07-25 NOTE — Patient Instructions (Signed)
Apply the prescribed cream twice daily to the irritated areas. Follow up if the symptoms persist, worsen or recur.

## 2017-07-26 DIAGNOSIS — Z961 Presence of intraocular lens: Secondary | ICD-10-CM | POA: Diagnosis not present

## 2017-07-26 DIAGNOSIS — H04123 Dry eye syndrome of bilateral lacrimal glands: Secondary | ICD-10-CM | POA: Diagnosis not present

## 2017-07-26 DIAGNOSIS — H5000 Unspecified esotropia: Secondary | ICD-10-CM | POA: Diagnosis not present

## 2017-07-26 DIAGNOSIS — H532 Diplopia: Secondary | ICD-10-CM | POA: Diagnosis not present

## 2017-07-26 LAB — URINE CULTURE: Organism ID, Bacteria: NO GROWTH

## 2017-07-26 MED ORDER — DEXLANSOPRAZOLE 60 MG PO CPDR: DELAYED_RELEASE_CAPSULE | ORAL | 3 refills | Status: DC

## 2017-07-26 NOTE — Telephone Encounter (Signed)
Refilled Dexilant 

## 2017-07-27 ENCOUNTER — Encounter: Payer: Self-pay | Admitting: Gynecology

## 2017-07-28 NOTE — Telephone Encounter (Signed)
Complete exam 03/2017, Calcium 9.4 04/06/2017

## 2017-07-30 ENCOUNTER — Other Ambulatory Visit: Payer: Self-pay | Admitting: Internal Medicine

## 2017-08-02 ENCOUNTER — Other Ambulatory Visit: Payer: Self-pay | Admitting: General Practice

## 2017-08-02 MED ORDER — LEVOTHYROXINE SODIUM 50 MCG PO TABS: ORAL_TABLET | ORAL | 5 refills | Status: DC

## 2017-08-03 ENCOUNTER — Telehealth: Payer: Self-pay

## 2017-08-03 NOTE — Telephone Encounter (Signed)
Rec'd fax from [place], we have pt taking her Simvastatin 40 mg QD but pt is reporting to be taking it three times a week. Please advise on how you wish to have patient take medication.

## 2017-08-04 NOTE — Telephone Encounter (Signed)
Noted Keep instructions and # for qd Thx

## 2017-08-05 NOTE — Telephone Encounter (Signed)
Faxed response to [facility]

## 2017-08-15 ENCOUNTER — Ambulatory Visit: Payer: Medicare Other

## 2017-08-18 ENCOUNTER — Ambulatory Visit
Admission: RE | Admit: 2017-08-18 | Discharge: 2017-08-18 | Disposition: A | Discharge status: Home / Self Care | Payer: Medicare Other | Source: Ambulatory Visit | Attending: Internal Medicine | Admitting: Internal Medicine

## 2017-08-18 DIAGNOSIS — Z1231 Encounter for screening mammogram for malignant neoplasm of breast: Secondary | ICD-10-CM

## 2017-08-21 DIAGNOSIS — Z23 Encounter for immunization: Secondary | ICD-10-CM | POA: Diagnosis not present

## 2017-08-23 ENCOUNTER — Encounter: Payer: Self-pay | Admitting: Gynecology

## 2017-08-23 DIAGNOSIS — H50011 Monocular esotropia, right eye: Secondary | ICD-10-CM | POA: Diagnosis not present

## 2017-08-23 DIAGNOSIS — Z961 Presence of intraocular lens: Secondary | ICD-10-CM | POA: Diagnosis not present

## 2017-08-23 NOTE — Telephone Encounter (Signed)
PC to pt left message. Insurance benefits [place] [phone] (met), co insurance 20% approx $210 , secondary coverage in full 100% cost to pt $0. LM to call me and schedule Prolia injection Nurse only visit

## 2017-08-29 NOTE — Telephone Encounter (Signed)
Prolia appointment 08/31/17 nurse only

## 2017-08-31 ENCOUNTER — Ambulatory Visit (INDEPENDENT_AMBULATORY_CARE_PROVIDER_SITE_OTHER): Payer: Medicare Other | Admitting: Gynecology

## 2017-08-31 DIAGNOSIS — J0100 Acute maxillary sinusitis, unspecified: Secondary | ICD-10-CM | POA: Diagnosis not present

## 2017-08-31 DIAGNOSIS — M810 Age-related osteoporosis without current pathological fracture: Secondary | ICD-10-CM

## 2017-08-31 DIAGNOSIS — J4520 Mild intermittent asthma, uncomplicated: Secondary | ICD-10-CM | POA: Diagnosis not present

## 2017-08-31 DIAGNOSIS — J3089 Other allergic rhinitis: Secondary | ICD-10-CM | POA: Diagnosis not present

## 2017-08-31 DIAGNOSIS — T63441A Toxic effect of venom of bees, accidental (unintentional), initial encounter: Secondary | ICD-10-CM | POA: Diagnosis not present

## 2017-08-31 MED ORDER — DENOSUMAB 60 MG/ML ~~LOC~~ SOLN
60.0000 mg | Freq: Once | SUBCUTANEOUS | Status: AC
  Administered 2017-08-31: 60 mg via SUBCUTANEOUS

## 2017-09-05 NOTE — Telephone Encounter (Signed)
prolia given 08/31/17  Next injection after 03/02/18

## 2017-09-20 ENCOUNTER — Ambulatory Visit (INDEPENDENT_AMBULATORY_CARE_PROVIDER_SITE_OTHER): Payer: Medicare Other | Admitting: Internal Medicine

## 2017-09-20 ENCOUNTER — Encounter: Payer: Self-pay | Admitting: Internal Medicine

## 2017-09-20 VITALS — BP 118/80 | HR 70 | Ht 60.05 in

## 2017-09-20 DIAGNOSIS — A09 Infectious gastroenteritis and colitis, unspecified: Secondary | ICD-10-CM

## 2017-09-20 DIAGNOSIS — K5901 Slow transit constipation: Secondary | ICD-10-CM

## 2017-09-20 DIAGNOSIS — K219 Gastro-esophageal reflux disease without esophagitis: Secondary | ICD-10-CM

## 2017-09-20 DIAGNOSIS — R140 Abdominal distension (gaseous): Secondary | ICD-10-CM

## 2017-09-20 MED ORDER — DEXLANSOPRAZOLE 60 MG PO CPDR: DELAYED_RELEASE_CAPSULE | ORAL | 11 refills | Status: DC

## 2017-09-20 NOTE — Progress Notes (Signed)
HISTORY OF PRESENT ILLNESS:  [name] is a 81 y.o. female with past medical history as listed below who has been followed in this office for GERD and IBS. She presents today regarding ongoing management of GERD, requesting medication refill, to discuss problems with constipation bloating. As well problems over the weekend with acute self-limited diarrhea after eating out. No vomiting. No fevers. Still with a tendency toward constipation. Had been using MiraLAX daily but occasionally results in looser than desired stools. Some abdominal bloating with right-sided discomfort. She has had this chronically. She tells me that she has gotten off her gluten-free diet but is interested in resuming such. She is a non-celiac. She has had no bleeding. Review of blood work from May 2018 finds unremarkable comprehensive metabolic panel. CBC one year ago was normal. Last imaging study via CT scan of the abdomen and pelvis in February 2016 was performed to evaluate her after traumatic fall. Left bloody contusion and rib fracture noted. Last complete colonoscopy July 2013 was negative except for a few scattered diverticula and internal hemorrhoids. No routine follow-up recommended. Currently takes Dexilant for her GERD. She does well with compliant. She typically takes it regularly.  REVIEW OF SYSTEMS:  All non-GI ROS negative except for fatigue, increased thirst at night  Past Medical History:  Diagnosis Date  . Allergic rhinitis   . Atrophic vaginitis   . Baker's cyst    Left-Dr. Aluisio  . Cataract    Dr. Katy Fitch  . Colon polyps   . Fibroid   . Gastritis    chronic  . GERD (gastroesophageal reflux disease)   . Heart murmur   . Hemorrhoids   . Hyperlipidemia   . IBS (irritable bowel syndrome)   . MVP (mitral valve prolapse)    Antibiotics required for dental procedures  . OA (osteoarthritis)   . Osteoporosis 05/2014   T score distal third radius -2.5  Spine, left and right hip all show statistically  significant improvement.  . Overactive bladder   . Thyroid disease   . Thyroid nodule   . Torn meniscus    bilateral    Past Surgical History:  Procedure Laterality Date  . CATARACT EXTRACTION, BILATERAL    . TONSILLECTOMY      Social History [name]  reports that she has quit smoking. She has never used smokeless tobacco. She reports that she drinks about 3.0 oz of alcohol per week . She reports that she does not use drugs.  family history includes Congestive Heart Failure in her mother; Heart attack in her brother; Heart disease in her mother; Hypertension in her mother; Osteoporosis in her mother; Pancreatic cancer in her father.  Allergies  Allergen Reactions  . Bee Venom Itching, Swelling and Rash    Itching, swelling and rash with bee stings, patient has epi pen  . Doxycycline     ??  . Neosporin [Neomycin-Bacitracin Zn-Polymyx]        PHYSICAL EXAMINATION: Vital signs: BP 118/80   Pulse 70   Ht 5' 0.05" (1.525 m)   SpO2 99%   Constitutional: generally well-appearing, no acute distress Psychiatric: alert and oriented x3, cooperative Eyes: extraocular movements intact, anicteric, conjunctiva pink Mouth: oral pharynx moist, no lesions Neck: supple no lymphadenopathy Cardiovascular: heart regular rate and rhythm, no murmur Lungs: clear to auscultation bilaterally Abdomen: soft, nontender, nondistended, no obvious ascites, no peritoneal signs, normal bowel sounds, no organomegaly Rectal: Omitted Extremities: no clubbing, cyanosis, or lower extremity edema bilaterally Skin: no lesions on  visible extremities Neuro: No focal deficits. Cranial nerves intact   ASSESSMENT:  #1. GERD. Requires PPI for control of symptoms. Doing well #2. Recent problems with self-limited diarrhea most likely infectious gastroenteritis or transient food intolerance #3. Chronic constipation with bloating consistent with known IBS #4. Colonoscopy 2013 negative for  neoplasia   PLAN:  #1. Reflux precautions #2. Told to resume MiraLAX at a lower dose #3. Encouraged to resume gluten-free diet as this has helped her before #4. Refilled Dexilant #5. Routine GI follow-up one year  25 minutes spent face-to-face with the patient. Greater than 50% a time she is for counseling regarding her GERD, chronic problems with bloating and constipation, and reviewing dietary recommendations as well as adjusting her medications as described

## 2017-09-20 NOTE — Patient Instructions (Signed)
We have sent the following medications to your pharmacy for you to pick up at your convenience:  Dexilant  Continue to use Miralax, titrate as needed

## 2017-09-28 ENCOUNTER — Encounter: Payer: Self-pay | Admitting: Internal Medicine

## 2017-09-28 ENCOUNTER — Telehealth: Payer: Self-pay | Admitting: Internal Medicine

## 2017-09-28 DIAGNOSIS — E782 Mixed hyperlipidemia: Secondary | ICD-10-CM

## 2017-09-28 DIAGNOSIS — E034 Atrophy of thyroid (acquired): Secondary | ICD-10-CM

## 2017-09-28 DIAGNOSIS — I10 Essential (primary) hypertension: Secondary | ICD-10-CM

## 2017-09-28 NOTE — Telephone Encounter (Signed)
Please advise 

## 2017-09-28 NOTE — Telephone Encounter (Signed)
Patient is requesting to have labs done before her appointment on Nov 7. She states she is not feeling well and wants lab work again this year to go over with the doctor. I informed she should come see the doctor first so he would have more of an idea on what to order if anything needs to be ordered. She demand that labs be put in. Please advise. Thank you.

## 2017-09-29 NOTE — Telephone Encounter (Signed)
Ok to order CBC, TSH, UA, lipids, BMET, LFT Thx

## 2017-09-30 NOTE — Telephone Encounter (Signed)
orders entered.

## 2017-10-03 ENCOUNTER — Ambulatory Visit: Payer: Medicare Other | Admitting: Podiatry

## 2017-10-03 DIAGNOSIS — T63441A Toxic effect of venom of bees, accidental (unintentional), initial encounter: Secondary | ICD-10-CM | POA: Diagnosis not present

## 2017-10-03 DIAGNOSIS — J209 Acute bronchitis, unspecified: Secondary | ICD-10-CM | POA: Diagnosis not present

## 2017-10-03 DIAGNOSIS — J3089 Other allergic rhinitis: Secondary | ICD-10-CM | POA: Diagnosis not present

## 2017-10-03 DIAGNOSIS — J4520 Mild intermittent asthma, uncomplicated: Secondary | ICD-10-CM | POA: Diagnosis not present

## 2017-10-05 ENCOUNTER — Ambulatory Visit (INDEPENDENT_AMBULATORY_CARE_PROVIDER_SITE_OTHER)
Admission: RE | Admit: 2017-10-05 | Discharge: 2017-10-05 | Disposition: A | Discharge status: Home / Self Care | Payer: Medicare Other | Source: Ambulatory Visit | Attending: Internal Medicine | Admitting: Internal Medicine

## 2017-10-05 ENCOUNTER — Encounter: Payer: Self-pay | Admitting: Internal Medicine

## 2017-10-05 ENCOUNTER — Ambulatory Visit (INDEPENDENT_AMBULATORY_CARE_PROVIDER_SITE_OTHER): Payer: Medicare Other | Admitting: Internal Medicine

## 2017-10-05 VITALS — BP 118/72 | HR 83 | Temp 98.4°F | Ht 60.05 in

## 2017-10-05 DIAGNOSIS — R059 Cough, unspecified: Secondary | ICD-10-CM

## 2017-10-05 DIAGNOSIS — R05 Cough: Secondary | ICD-10-CM

## 2017-10-05 DIAGNOSIS — R079 Chest pain, unspecified: Secondary | ICD-10-CM | POA: Diagnosis not present

## 2017-10-05 DIAGNOSIS — J301 Allergic rhinitis due to pollen: Secondary | ICD-10-CM

## 2017-10-05 DIAGNOSIS — J069 Acute upper respiratory infection, unspecified: Secondary | ICD-10-CM

## 2017-10-05 MED ORDER — PROMETHAZINE-CODEINE 6.25-10 MG/5ML PO SYRP: 5.0000 mL | ORAL_SOLUTION | ORAL | 0 refills | Status: DC | PRN

## 2017-10-05 MED ORDER — LEVOFLOXACIN 500 MG PO TABS: 500.0000 mg | ORAL_TABLET | Freq: Every day | ORAL | 0 refills | Status: AC

## 2017-10-05 NOTE — Progress Notes (Signed)
Subjective:  Patient ID: [name], female    DOB: Apr 25, 1936  Age: 81 y.o. MRN: [id number]  CC: No chief complaint on file.   HPI [name] presents for URI since Sun. Jolea saw Dr Brown Human and given Zpac, Dose pac on Monday. She is 20% better. No CP, no chills    Outpatient Medications Prior to Visit  Medication Sig Dispense Refill  . albuterol (PROVENTIL HFA;VENTOLIN HFA) 108 (90 BASE) MCG/ACT inhaler Inhale 2 puffs into the lungs every 6 (six) hours as needed for wheezing. 1 Inhaler 3  . amoxicillin (AMOXIL) 500 MG capsule Reported on 06/11/2016    . aspirin 81 MG tablet Take 81 mg by mouth daily.      . Calcium Carbonate-Vitamin D (CALCIUM-VITAMIN D) 500-200 MG-UNIT per tablet Take 3 tablets by mouth daily.     . Cholecalciferol 4000 units TABS Take 1 tablet by mouth daily.    [name] dexlansoprazole (DEXILANT) 60 MG capsule TAKE (1) CAPSULE DAILY. 30 capsule 11  . EPIPEN 2-PAK 0.3 MG/0.3ML SOAJ injection Inject 0.3 mg as directed as needed.     [name] ESTRACE VAGINAL 0.1 MG/GM vaginal cream PLACE 1 APPLICATORFUL VAGINALLY 3 TIMES A WEEK. 42.5 g 3  . fexofenadine (ALLEGRA) 180 MG tablet Take 180 mg by mouth.    . fluticasone (FLONASE) 50 MCG/ACT nasal spray USE 2 SPRAYS IN EACH NOSTRIL ONCE DAILY 16 g 0  . ipratropium (ATROVENT) 0.06 % nasal spray Place 1 spray into the nose 3 (three) times daily as needed for rhinitis. 15 mL 0  . levothyroxine (SYNTHROID) 50 MCG tablet TAKE 1 TABLET ONCE DAILY BEFORE BREAKFAST. 30 tablet 5  . LORazepam (ATIVAN) 1 MG tablet Take 1-2 tablets (1-2 mg total) by mouth 2 (two) times daily as needed for anxiety. 100 tablet 3  . montelukast (SINGULAIR) 10 MG tablet Take 10 mg by mouth at bedtime.     [name] nystatin-triamcinolone ointment (MYCOLOG) Apply 1 application topically 2 (two) times daily. 30 g 1  . Phosphatidylserine-DHA-EPA (VAYACOG) 100-19.5-6.5 MG CAPS Take 1 capsule by mouth daily. 90 capsule 3  . SALINE NASAL SPRAY NA Place into the nose.    . simvastatin  (ZOCOR) 40 MG tablet TAKE 1 TABLET ONCE DAILY. 90 tablet 3  . VESICARE 10 MG tablet Take 1 tablet by mouth every other day.      Facility-Administered Medications Prior to Visit  Medication Dose Route Frequency Provider Last Rate Last Dose  . triamcinolone acetonide (KENALOG) 10 MG/ML injection 10 mg  10 mg Other Once [name], [name]        ROS Review of Systems  Constitutional: Positive for fatigue. Negative for activity change, appetite change, chills and unexpected weight change.  HENT: Negative for congestion, mouth sores and sinus pressure.   Eyes: Negative for visual disturbance.  Respiratory: Positive for cough and wheezing. Negative for chest tightness and shortness of breath.   Cardiovascular: Negative for chest pain, palpitations and leg swelling.  Gastrointestinal: Negative for abdominal pain and nausea.  Genitourinary: Negative for difficulty urinating, frequency and vaginal pain.  Musculoskeletal: Negative for back pain and gait problem.  Skin: Negative for pallor and rash.  Neurological: Positive for weakness. Negative for dizziness, tremors, numbness and headaches.  Psychiatric/Behavioral: Negative for confusion and sleep disturbance.    Objective:  BP 118/72 (BP Location: Left Arm, Patient Position: Sitting, Cuff Size: Normal)   Pulse 83   Temp 98.4 F (36.9 C) (Oral)   Ht 5' 0.05" (1.525 m)  SpO2 98%   BMI 24.37 kg/m   BP Readings from Last 3 Encounters:  10/05/17 118/72  09/20/17 118/80  07/25/17 116/70    Wt Readings from Last 3 Encounters:  04/07/17 125 lb (56.7 kg)  04/01/17 122 lb (55.3 kg)  08/12/16 127 lb (57.6 kg)    Physical Exam  Constitutional: She appears well-developed. No distress.  HENT:  Head: Normocephalic.  Right Ear: External ear normal.  Left Ear: External ear normal.  Nose: Nose normal.  Mouth/Throat: Oropharynx is clear and moist.  Eyes: Conjunctivae are normal. Pupils are equal, round, and reactive to light. Right eye  exhibits no discharge. Left eye exhibits no discharge.  Neck: Normal range of motion. Neck supple. No JVD present. No tracheal deviation present. No thyromegaly present.  Cardiovascular: Normal rate, regular rhythm and normal heart sounds.  Pulmonary/Chest: No stridor. No respiratory distress. She has no wheezes.  Abdominal: Soft. Bowel sounds are normal. She exhibits no distension and no mass. There is no tenderness. There is no rebound and no guarding.  Musculoskeletal: She exhibits no edema or tenderness.  Lymphadenopathy:    She has no cervical adenopathy.  Neurological: She displays normal reflexes. No cranial nerve deficit. She exhibits normal muscle tone. Coordination normal.  Skin: No rash noted. No erythema.  Psychiatric: She has a normal mood and affect. Her behavior is normal. Judgment and thought content normal.  L lung w/coarse BS eryth throat  Lab Results  Component Value Date   WBC 6.0 11/01/2016   HGB 13.2 11/01/2016   HCT 39.4 11/01/2016   PLT 294.0 11/01/2016   GLUCOSE 104 (H) 04/06/2017   CHOL 193 04/06/2017   TRIG 156.0 (H) 04/06/2017   HDL 52.90 04/06/2017   LDLCALC 109 (H) 04/06/2017   ALT 19 04/06/2017   AST 20 04/06/2017   NA 137 04/06/2017   K 4.2 04/06/2017   CL 100 04/06/2017   CREATININE 0.76 04/06/2017   BUN 13 04/06/2017   CO2 28 04/06/2017   TSH 2.62 04/06/2017   HGBA1C 6.0 07/08/2016    Mm Screening Breast Tomo Bilateral  Result Date: 08/18/2017 CLINICAL DATA:  Screening. EXAM: 2D DIGITAL SCREENING BILATERAL MAMMOGRAM WITH CAD AND ADJUNCT TOMO COMPARISON:  Previous exam(s). ACR Breast Density Category b: There are scattered areas of fibroglandular density. FINDINGS: There are no findings suspicious for malignancy. Images were processed with CAD. IMPRESSION: No mammographic evidence of malignancy. A result letter of this screening mammogram will be mailed directly to the patient. RECOMMENDATION: Screening mammogram in one year. (Code:SM-B-01Y)  BI-RADS CATEGORY  1: Negative. Electronically Signed   By: Claudie Revering M.D.   On: 08/18/2017 12:28    Assessment & Plan:   There are no diagnoses linked to this encounter. I am having [name] maintain her aspirin, calcium-vitamin D, SALINE NASAL SPRAY NA, albuterol, ipratropium, EPIPEN 2-PAK, montelukast, amoxicillin, VESICARE, fluticasone, simvastatin, ESTRACE VAGINAL, Cholecalciferol, Phosphatidylserine-DHA-EPA, fexofenadine, LORazepam, nystatin-triamcinolone ointment, levothyroxine, and dexlansoprazole. We will continue to administer triamcinolone acetonide.  No orders of the defined types were placed in this encounter.    Follow-up: No Follow-up on file.  Walker Kehr, MD

## 2017-10-05 NOTE — Assessment & Plan Note (Signed)
R/o L pneumonia CXR Levaquin Prom-cod

## 2017-10-05 NOTE — Patient Instructions (Signed)
You can use over-the-counter  "cold" medicines  such as Use " Delsym" or" Robitussin" cough syrup varietis for cough.  You can use plain "Tylenol" or "Advil" for fever, chills and achyness. Use Halls or Ricola cough drops.    Please, make an appointment if you are not better or if you're worse.

## 2017-10-06 ENCOUNTER — Encounter: Payer: Self-pay | Admitting: Internal Medicine

## 2017-10-07 ENCOUNTER — Encounter: Payer: Self-pay | Admitting: Internal Medicine

## 2017-10-07 ENCOUNTER — Ambulatory Visit (INDEPENDENT_AMBULATORY_CARE_PROVIDER_SITE_OTHER): Payer: Medicare Other | Admitting: Internal Medicine

## 2017-10-07 ENCOUNTER — Telehealth: Payer: Self-pay | Admitting: Internal Medicine

## 2017-10-07 VITALS — BP 124/82 | HR 75 | Temp 98.0°F | Ht 60.05 in

## 2017-10-07 DIAGNOSIS — J301 Allergic rhinitis due to pollen: Secondary | ICD-10-CM

## 2017-10-07 DIAGNOSIS — J45909 Unspecified asthma, uncomplicated: Secondary | ICD-10-CM

## 2017-10-07 DIAGNOSIS — J069 Acute upper respiratory infection, unspecified: Secondary | ICD-10-CM

## 2017-10-07 MED ORDER — BUDESONIDE-FORMOTEROL FUMARATE 160-4.5 MCG/ACT IN AERO: 2.0000 | INHALATION_SPRAY | Freq: Two times a day (BID) | RESPIRATORY_TRACT | 3 refills | Status: DC

## 2017-10-07 MED ORDER — METHYLPREDNISOLONE ACETATE 80 MG/ML IJ SUSP
80.0000 mg | Freq: Once | INTRAMUSCULAR | Status: AC
  Administered 2017-10-07: 80 mg via INTRAMUSCULAR

## 2017-10-07 NOTE — Telephone Encounter (Signed)
LVM to inform patient to be here TODAY 11/8 @ 415pm

## 2017-10-07 NOTE — Telephone Encounter (Signed)
Okay to schedule at 415

## 2017-10-07 NOTE — Patient Instructions (Addendum)
Symbicort 2 puffs twice a day. Use Ventolin as needed only (a rescue inhaler) Do not take Prednisone if feeling better tomorrow  Use [employer] Peroxicare tooth paste

## 2017-10-07 NOTE — Progress Notes (Signed)
Subjective:  Patient ID: [name], female    DOB: 10/03/1936  Age: 81 y.o. MRN: [id number]  CC: No chief complaint on file.   HPI [name] presents for a worsening wheezing. [name] finished Z pac and started Levaquin. Slept ok. No SOB. No fever  Outpatient Medications Prior to Visit  Medication Sig Dispense Refill  . albuterol (PROVENTIL HFA;VENTOLIN HFA) 108 (90 BASE) MCG/ACT inhaler Inhale 2 puffs into the lungs every 6 (six) hours as needed for wheezing. 1 Inhaler 3  . amoxicillin (AMOXIL) 500 MG capsule Reported on 06/11/2016    . aspirin 81 MG tablet Take 81 mg by mouth daily.      [name] azithromycin (ZITHROMAX) 250 MG tablet     . Calcium Carbonate-Vitamin D (CALCIUM-VITAMIN D) 500-200 MG-UNIT per tablet Take 3 tablets by mouth daily.     . Cholecalciferol 4000 units TABS Take 1 tablet by mouth daily.    [name] dexlansoprazole (DEXILANT) 60 MG capsule TAKE (1) CAPSULE DAILY. 30 capsule 11  . EPIPEN 2-PAK 0.3 MG/0.3ML SOAJ injection Inject 0.3 mg as directed as needed.     [name] ESTRACE VAGINAL 0.1 MG/GM vaginal cream PLACE 1 APPLICATORFUL VAGINALLY 3 TIMES A WEEK. 42.5 g 3  . fexofenadine (ALLEGRA) 180 MG tablet Take 180 mg by mouth.    . fluticasone (FLONASE) 50 MCG/ACT nasal spray USE 2 SPRAYS IN EACH NOSTRIL ONCE DAILY 16 g 0  . ipratropium (ATROVENT) 0.06 % nasal spray Place 1 spray into the nose 3 (three) times daily as needed for rhinitis. 15 mL 0  . levofloxacin (LEVAQUIN) 500 MG tablet Take 1 tablet (500 mg total) daily for 10 days by mouth. 10 tablet 0  . levothyroxine (SYNTHROID) 50 MCG tablet TAKE 1 TABLET ONCE DAILY BEFORE BREAKFAST. 30 tablet 5  . LORazepam (ATIVAN) 1 MG tablet Take 1-2 tablets (1-2 mg total) by mouth 2 (two) times daily as needed for anxiety. 100 tablet 3  . montelukast (SINGULAIR) 10 MG tablet Take 10 mg by mouth at bedtime.     [name] nystatin-triamcinolone ointment (MYCOLOG) Apply 1 application topically 2 (two) times daily. 30 g 1  . Phosphatidylserine-DHA-EPA  (VAYACOG) 100-19.5-6.5 MG CAPS Take 1 capsule by mouth daily. 90 capsule 3  . predniSONE (DELTASONE) 10 MG tablet     . promethazine-codeine (PHENERGAN WITH CODEINE) 6.25-10 MG/5ML syrup Take 5 mLs every 4 (four) hours as needed by mouth. 300 mL 0  . SALINE NASAL SPRAY NA Place into the nose.    . simvastatin (ZOCOR) 40 MG tablet TAKE 1 TABLET ONCE DAILY. 90 tablet 3  . VESICARE 10 MG tablet Take 1 tablet by mouth every other day.      Facility-Administered Medications Prior to Visit  Medication Dose Route Frequency Provider Last Rate Last Dose  . triamcinolone acetonide (KENALOG) 10 MG/ML injection 10 mg  10 mg Other Once [name], [name]        ROS Review of Systems  Constitutional: Positive for fatigue. Negative for activity change, appetite change, chills and unexpected weight change.  HENT: Positive for congestion. Negative for mouth sores and sinus pressure.   Eyes: Negative for visual disturbance.  Respiratory: Positive for chest tightness and wheezing. Negative for cough.   Gastrointestinal: Negative for abdominal pain and nausea.  Genitourinary: Negative for difficulty urinating, frequency and vaginal pain.  Musculoskeletal: Negative for back pain and gait problem.  Skin: Negative for pallor and rash.  Neurological: Negative for dizziness, tremors, weakness, numbness and headaches.  Psychiatric/Behavioral: Negative for confusion and sleep disturbance.    Objective:  BP 124/82 (BP Location: Left Arm, Patient Position: Sitting, Cuff Size: Normal)   Pulse 75   Temp 98 F (36.7 C) (Oral)   Ht 5' 0.05" (1.525 m)   SpO2 98%   BMI 24.37 kg/m   BP Readings from Last 3 Encounters:  10/07/17 124/82  10/05/17 118/72  09/20/17 118/80    Wt Readings from Last 3 Encounters:  04/07/17 125 lb (56.7 kg)  04/01/17 122 lb (55.3 kg)  08/12/16 127 lb (57.6 kg)    Physical Exam  Constitutional: She appears well-developed. No distress.  HENT:  Head: Normocephalic.  Right  Ear: External ear normal.  Left Ear: External ear normal.  Nose: Nose normal.  Mouth/Throat: Oropharynx is clear and moist.  Eyes: Conjunctivae are normal. Pupils are equal, round, and reactive to light. Right eye exhibits no discharge. Left eye exhibits no discharge.  Neck: Normal range of motion. Neck supple. No JVD present. No tracheal deviation present. No thyromegaly present.  Cardiovascular: Normal rate, regular rhythm and normal heart sounds.  Pulmonary/Chest: No stridor. No respiratory distress. She has wheezes. She has rales.  Abdominal: Soft. Bowel sounds are normal. She exhibits no distension and no mass. There is no tenderness. There is no rebound and no guarding.  Musculoskeletal: She exhibits no edema or tenderness.  Lymphadenopathy:    She has no cervical adenopathy.  Neurological: She displays normal reflexes. No cranial nerve deficit. She exhibits normal muscle tone. Coordination normal.  Skin: No rash noted. No erythema.  Psychiatric: She has a normal mood and affect. Her behavior is normal. Judgment and thought content normal.  Wheezing R>>L NAD Looks less tired  Lab Results  Component Value Date   WBC 6.0 11/01/2016   HGB 13.2 11/01/2016   HCT 39.4 11/01/2016   PLT 294.0 11/01/2016   GLUCOSE 104 (H) 04/06/2017   CHOL 193 04/06/2017   TRIG 156.0 (H) 04/06/2017   HDL 52.90 04/06/2017   LDLCALC 109 (H) 04/06/2017   ALT 19 04/06/2017   AST 20 04/06/2017   NA 137 04/06/2017   K 4.2 04/06/2017   CL 100 04/06/2017   CREATININE 0.76 04/06/2017   BUN 13 04/06/2017   CO2 28 04/06/2017   TSH 2.62 04/06/2017   HGBA1C 6.0 07/08/2016    Dg Chest 2 View  Result Date: 10/05/2017 CLINICAL DATA:  Cough congestion and chest pain for 1 week. EXAM: CHEST  2 VIEW COMPARISON:  01/21/2015 chest CT and 08/20/2013 chest radiograph FINDINGS: The cardiomediastinal silhouette is unremarkable. Mild peribronchial thickening again noted. There is no evidence of focal airspace disease,  pulmonary edema, suspicious pulmonary nodule/mass, pleural effusion, or pneumothorax. No acute bony abnormalities are identified. IMPRESSION: No active cardiopulmonary disease. Electronically Signed   By: Margarette Canada M.D.   On: 10/05/2017 16:13    Assessment & Plan:   There are no diagnoses linked to this encounter. I am having [name] maintain her aspirin, calcium-vitamin D, SALINE NASAL SPRAY NA, albuterol, ipratropium, EPIPEN 2-PAK, montelukast, amoxicillin, VESICARE, fluticasone, simvastatin, ESTRACE VAGINAL, Cholecalciferol, Phosphatidylserine-DHA-EPA, fexofenadine, LORazepam, nystatin-triamcinolone ointment, levothyroxine, dexlansoprazole, levofloxacin, promethazine-codeine, azithromycin, and predniSONE. We will continue to administer triamcinolone acetonide.  Meds ordered this encounter  Medications  . azithromycin (ZITHROMAX) 250 MG tablet  . predniSONE (DELTASONE) 10 MG tablet     Follow-up: No Follow-up on file.  Walker Kehr, MD

## 2017-10-07 NOTE — Assessment & Plan Note (Addendum)
asthmatic bronchitis - refractory Depo-medrol given IM 80 mg Start Symbicort samples 160/4.5 MDI Ventolin prn Levaquin po Prom-cod cough syr

## 2017-10-07 NOTE — Telephone Encounter (Signed)
Patient is requesting to come in today. She states she is not feeling any better. She has starting wheezing. We have no open shots in the office today. Is Dr. Camila Li ok with working her in?

## 2017-10-07 NOTE — Telephone Encounter (Signed)
[place] on pts phone about appt being scheduled for 415 today

## 2017-10-07 NOTE — Telephone Encounter (Signed)
Patient does not accept this. She states Dr.Plot told her he would see her if she was not better. She states she feels bad and is wheezing and the asthmas is starting she wants him to look at her chest. I informed her I could set her up at another location or on Saturday clinic. She asked me to send another message back.

## 2017-10-07 NOTE — Assessment & Plan Note (Signed)
Depo-medrol IM 

## 2017-10-07 NOTE — Telephone Encounter (Signed)
We are already overbooked today, so we are unable to add anymore patients.

## 2017-10-09 ENCOUNTER — Encounter: Payer: Self-pay | Admitting: Internal Medicine

## 2017-10-09 NOTE — Assessment & Plan Note (Signed)
Flonase, Atrovent nasal, Singulair, Claritin 

## 2017-10-10 DIAGNOSIS — J3089 Other allergic rhinitis: Secondary | ICD-10-CM | POA: Diagnosis not present

## 2017-10-10 DIAGNOSIS — T63441A Toxic effect of venom of bees, accidental (unintentional), initial encounter: Secondary | ICD-10-CM | POA: Diagnosis not present

## 2017-10-10 DIAGNOSIS — J4520 Mild intermittent asthma, uncomplicated: Secondary | ICD-10-CM | POA: Diagnosis not present

## 2017-10-10 DIAGNOSIS — R05 Cough: Secondary | ICD-10-CM | POA: Diagnosis not present

## 2017-10-12 ENCOUNTER — Ambulatory Visit: Payer: Medicare Other | Admitting: Gynecology

## 2017-10-12 ENCOUNTER — Ambulatory Visit: Payer: Medicare Other | Admitting: Podiatry

## 2017-10-13 DIAGNOSIS — J4520 Mild intermittent asthma, uncomplicated: Secondary | ICD-10-CM | POA: Diagnosis not present

## 2017-10-13 DIAGNOSIS — R05 Cough: Secondary | ICD-10-CM | POA: Diagnosis not present

## 2017-10-13 DIAGNOSIS — J3089 Other allergic rhinitis: Secondary | ICD-10-CM | POA: Diagnosis not present

## 2017-10-13 DIAGNOSIS — T63441A Toxic effect of venom of bees, accidental (unintentional), initial encounter: Secondary | ICD-10-CM | POA: Diagnosis not present

## 2017-10-24 ENCOUNTER — Ambulatory Visit (INDEPENDENT_AMBULATORY_CARE_PROVIDER_SITE_OTHER): Payer: Medicare Other | Admitting: Gynecology

## 2017-10-24 ENCOUNTER — Encounter: Payer: Self-pay | Admitting: Gynecology

## 2017-10-24 VITALS — BP 116/76

## 2017-10-24 DIAGNOSIS — N6019 Diffuse cystic mastopathy of unspecified breast: Secondary | ICD-10-CM | POA: Diagnosis not present

## 2017-10-24 NOTE — Patient Instructions (Signed)
All up in 6 months when due for annual exam.

## 2017-10-24 NOTE — Progress Notes (Signed)
    [name] 1936/08/17 [id number]        81 y.o.  [id number] with a history of fibrocystic changes in her breasts over the years.  She presents every 57-month for physician breast exam at her choice.  Recently had mammography which was negative.  No palpable abnormalities on self breast exam.  Past medical history,surgical history, problem list, medications, allergies, family history and social history were all reviewed and documented in the EPIC chart.  Directed ROS with pertinent positives and negatives documented in the history of present illness/assessment and plan.  Exam: [name] assistant Vitals:   10/24/17 0927  BP: 116/76   General appearance:  Normal HEENT normal without evidence of cervical or supraclavicular adenopathy. Both breasts examined lying and sitting without masses, retractions, discharge, adenopathy.  Assessment/Plan:  81 y.o. [id number] with normal breast exam.  Follow-up in 6 months for annual exam when due.    Anastasio Auerbach MD, 9:47 AM 10/24/2017

## 2017-10-27 ENCOUNTER — Ambulatory Visit (INDEPENDENT_AMBULATORY_CARE_PROVIDER_SITE_OTHER): Payer: Medicare Other | Admitting: Podiatry

## 2017-10-27 ENCOUNTER — Encounter: Payer: Self-pay | Admitting: Podiatry

## 2017-10-27 DIAGNOSIS — M779 Enthesopathy, unspecified: Secondary | ICD-10-CM | POA: Diagnosis not present

## 2017-10-27 DIAGNOSIS — M2042 Other hammer toe(s) (acquired), left foot: Secondary | ICD-10-CM

## 2017-10-27 DIAGNOSIS — L84 Corns and callosities: Secondary | ICD-10-CM | POA: Diagnosis not present

## 2017-10-27 MED ORDER — TRIAMCINOLONE ACETONIDE 10 MG/ML IJ SUSP: 10.0000 mg | Freq: Once | INTRAMUSCULAR | Status: DC

## 2017-10-27 NOTE — Progress Notes (Signed)
Subjective:   Patient ID: [name], female   DOB: 81 y.o.   MRN: [id number]   HPI Patient presents with inflammation pain of the fourth interspace left foot with keratotic lesion formation and inability to wear shoe gear with any degree of comfort.  Also has concerns about lifting of her right hallux nail and states it becomes occasionally tender but she has not noted drainage.   ROS      Objective:  Physical Exam  Neurovascular status was found to be intact with muscle strength adequate.  Patient is noted to have inflammatory changes in the fourth interspace left foot with fluid buildup and digital abnormality of the fifth digit pressing against the fourth toe.  Patient also has lifting of the right hallux nail that is moderately tender when pressed.     Assessment:  Inflammatory capsulitis the fourth interspace left with fluid buildup along the keratotic lesion on the fourth and fifth toes with rotational component of the fifth toe noted.     Plan:  H&P condition reviewed and at this time I injected the fourth interspace and into the capsule of the fourth MPJ with 2 mg dexamethasone Kenalog 5 mg Xylocaine debrided lesion and debrided lesion of the second digit left.  For the nail I recommended soaks and patient will be seen back on an as-needed basis for this.

## 2017-11-01 ENCOUNTER — Other Ambulatory Visit: Payer: Self-pay | Admitting: Internal Medicine

## 2017-11-03 ENCOUNTER — Encounter: Payer: Self-pay | Admitting: Internal Medicine

## 2017-11-03 DIAGNOSIS — J4520 Mild intermittent asthma, uncomplicated: Secondary | ICD-10-CM | POA: Diagnosis not present

## 2017-11-03 DIAGNOSIS — T63441A Toxic effect of venom of bees, accidental (unintentional), initial encounter: Secondary | ICD-10-CM | POA: Diagnosis not present

## 2017-11-03 DIAGNOSIS — R05 Cough: Secondary | ICD-10-CM | POA: Diagnosis not present

## 2017-11-03 DIAGNOSIS — J3089 Other allergic rhinitis: Secondary | ICD-10-CM | POA: Diagnosis not present

## 2017-11-06 ENCOUNTER — Other Ambulatory Visit: Payer: Self-pay | Admitting: Internal Medicine

## 2017-11-06 MED ORDER — AZITHROMYCIN 250 MG PO TABS: ORAL_TABLET | ORAL | 0 refills | Status: DC

## 2017-11-06 MED ORDER — METHYLPREDNISOLONE 4 MG PO TBPK: ORAL_TABLET | ORAL | 0 refills | Status: DC

## 2017-11-07 ENCOUNTER — Encounter: Payer: Self-pay | Admitting: Internal Medicine

## 2017-11-25 ENCOUNTER — Other Ambulatory Visit: Payer: Self-pay | Admitting: Internal Medicine

## 2017-11-28 ENCOUNTER — Telehealth: Payer: Self-pay | Admitting: Internal Medicine

## 2017-11-28 MED ORDER — LORAZEPAM 1 MG PO TABS: 1.0000 mg | ORAL_TABLET | Freq: Two times a day (BID) | ORAL | 3 refills | Status: DC | PRN

## 2017-11-28 NOTE — Telephone Encounter (Signed)
Done. Thx.

## 2017-11-28 NOTE — Telephone Encounter (Signed)
Copied from [place] [id number]. Topic: Inquiry >> Nov 28, 2017  8:58 AM Pricilla Handler wrote: Reason for CRM: Patient called requesting a URGENT Refill of LORazepam (ATIVAN) 1 MG tablet. Patient stated that her pharmacy sent in a request for this medication on last week, but did not receive a response. Patient stated that she took her last pill last night. Patient also stated that her cousin died on yesterday, and that she has to leave to go out of town. Patient wants her refill this morning. Please call this patient once refill has been completed. Patient stated that she is willing to come by this office personally if need be to pick up the prescription. [place], [place] [phone] (Phone)  [phone] (Fax).

## 2017-11-28 NOTE — Telephone Encounter (Signed)
Copied from [place] [id number]. Topic: Inquiry >> Nov 28, 2017  8:58 AM Pricilla Handler wrote: Reason for CRM: Patient called requesting a URGENT Refill of LORazepam (ATIVAN) 1 MG tablet. Patient stated that her pharmacy sent in a request for this medication on last week, but did not receive a response. Patient stated that she took her last pill last night. Patient also stated that her cousin died on yesterday, and that she has to leave to go out of town. Patient wants her refill this morning. Please call this patient once refill has been completed. Patient stated that she is willing to come by this office personally if need be to pick up the prescription. [place], [place] [phone] (Phone)  [phone] (Fax).

## 2017-12-07 DIAGNOSIS — J343 Hypertrophy of nasal turbinates: Secondary | ICD-10-CM | POA: Diagnosis not present

## 2017-12-07 DIAGNOSIS — J310 Chronic rhinitis: Secondary | ICD-10-CM | POA: Diagnosis not present

## 2017-12-07 DIAGNOSIS — R0982 Postnasal drip: Secondary | ICD-10-CM | POA: Diagnosis not present

## 2017-12-07 DIAGNOSIS — H6121 Impacted cerumen, right ear: Secondary | ICD-10-CM | POA: Diagnosis not present

## 2017-12-09 DIAGNOSIS — R350 Frequency of micturition: Secondary | ICD-10-CM | POA: Diagnosis not present

## 2017-12-09 DIAGNOSIS — R3989 Other symptoms and signs involving the genitourinary system: Secondary | ICD-10-CM | POA: Diagnosis not present

## 2017-12-09 DIAGNOSIS — R3915 Urgency of urination: Secondary | ICD-10-CM | POA: Diagnosis not present

## 2017-12-12 DIAGNOSIS — J301 Allergic rhinitis due to pollen: Secondary | ICD-10-CM | POA: Diagnosis not present

## 2017-12-12 DIAGNOSIS — J3089 Other allergic rhinitis: Secondary | ICD-10-CM | POA: Diagnosis not present

## 2017-12-12 DIAGNOSIS — T63441A Toxic effect of venom of bees, accidental (unintentional), initial encounter: Secondary | ICD-10-CM | POA: Diagnosis not present

## 2017-12-12 DIAGNOSIS — J4520 Mild intermittent asthma, uncomplicated: Secondary | ICD-10-CM | POA: Diagnosis not present

## 2018-01-11 ENCOUNTER — Encounter: Payer: Self-pay | Admitting: Internal Medicine

## 2018-01-12 ENCOUNTER — Other Ambulatory Visit: Payer: Self-pay | Admitting: Internal Medicine

## 2018-01-12 MED ORDER — LEVOTHYROXINE SODIUM 50 MCG PO TABS: ORAL_TABLET | ORAL | 11 refills | Status: DC

## 2018-01-12 MED ORDER — AZITHROMYCIN 250 MG PO TABS: ORAL_TABLET | ORAL | 0 refills | Status: DC

## 2018-01-12 NOTE — Telephone Encounter (Signed)
Routing to dr plotnikov, please advise, thanks 

## 2018-01-13 ENCOUNTER — Telehealth: Payer: Self-pay | Admitting: *Deleted

## 2018-01-13 NOTE — Telephone Encounter (Addendum)
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

## 2018-01-31 DIAGNOSIS — H1013 Acute atopic conjunctivitis, bilateral: Secondary | ICD-10-CM | POA: Diagnosis not present

## 2018-02-01 ENCOUNTER — Telehealth: Payer: Self-pay | Admitting: Internal Medicine

## 2018-02-01 NOTE — Telephone Encounter (Signed)
Copied from [place] [id number]. Topic: Quick Communication - See Telephone Encounter >> Feb 01, 2018  1:53 PM Burnis Medin, NT wrote: CRM for notification. See Telephone encounter for: Patient called and wanted to see if the doctor could put in orders so she can get blood work. Pt said she have been feeling tired lately and would like a call back. Pt app is scheduled on 3/20  02/01/18.

## 2018-02-02 NOTE — Telephone Encounter (Signed)
Please advise 

## 2018-02-03 NOTE — Telephone Encounter (Signed)
Pt.notified

## 2018-02-03 NOTE — Telephone Encounter (Signed)
The lab work orders were entered on 09/30/2017 Thx

## 2018-02-07 ENCOUNTER — Other Ambulatory Visit (INDEPENDENT_AMBULATORY_CARE_PROVIDER_SITE_OTHER): Payer: Medicare Other

## 2018-02-07 DIAGNOSIS — I10 Essential (primary) hypertension: Secondary | ICD-10-CM | POA: Diagnosis not present

## 2018-02-07 DIAGNOSIS — E034 Atrophy of thyroid (acquired): Secondary | ICD-10-CM

## 2018-02-07 DIAGNOSIS — E782 Mixed hyperlipidemia: Secondary | ICD-10-CM

## 2018-02-07 LAB — BASIC METABOLIC PANEL
BUN: 14 mg/dL (ref 6–23)
CO2: 30 mEq/L (ref 19–32)
Calcium: 9.3 mg/dL (ref 8.4–10.5)
Chloride: 103 mEq/L (ref 96–112)
Creatinine, Ser: 0.64 mg/dL (ref 0.40–1.20)
GFR: 94.99 mL/min (ref >60.00)
Glucose, Bld: 95 mg/dL (ref 70–99)
Potassium: 4 mEq/L (ref 3.5–5.1)
Sodium: 139 mEq/L (ref 135–145)

## 2018-02-07 LAB — CBC WITH DIFFERENTIAL/PLATELET
Basophils Absolute: 0 10*3/uL (ref 0.0–0.1)
Basophils Relative: 0.9 % (ref 0.0–3.0)
Eosinophils Absolute: 0.2 10*3/uL (ref 0.0–0.7)
Eosinophils Relative: 3.9 % (ref 0.0–5.0)
HCT: 39.1 % (ref 36.0–46.0)
Hemoglobin: 13.3 g/dL (ref 12.0–15.0)
Lymphocytes Relative: 23 % (ref 12.0–46.0)
Lymphs Abs: 1.3 10*3/uL (ref 0.7–4.0)
MCHC: 34.2 g/dL (ref 30.0–36.0)
MCV: 94.4 fl (ref 78.0–100.0)
Monocytes Absolute: 0.7 10*3/uL (ref 0.1–1.0)
Monocytes Relative: 11.6 % (ref 3.0–12.0)
Neutro Abs: 3.4 10*3/uL (ref 1.4–7.7)
Neutrophils Relative %: 60.6 % (ref 43.0–77.0)
Platelets: 255 10*3/uL (ref 150.0–400.0)
RBC: 4.14 Mil/uL (ref 3.87–5.11)
RDW: 12.7 % (ref 11.5–15.5)
WBC: 5.6 10*3/uL (ref 4.0–10.5)

## 2018-02-07 LAB — URINALYSIS
Bilirubin Urine: NEGATIVE
Hgb urine dipstick: NEGATIVE
Ketones, ur: NEGATIVE
Leukocytes, UA: NEGATIVE
Nitrite: NEGATIVE
Specific Gravity, Urine: 1.015 (ref 1.000–1.030)
Total Protein, Urine: NEGATIVE
Urine Glucose: NEGATIVE
Urobilinogen, UA: 0.2 (ref 0.0–1.0)
pH: 6.5 (ref 5.0–8.0)

## 2018-02-07 LAB — LIPID PANEL
Cholesterol: 188 mg/dL (ref 0–200)
HDL: 53.8 mg/dL (ref >39.00)
LDL Cholesterol: 105 mg/dL — ABNORMAL HIGH (ref 0–99)
NonHDL: 134.3
Total CHOL/HDL Ratio: 3
Triglycerides: 146 mg/dL (ref 0.0–149.0)
VLDL: 29.2 mg/dL (ref 0.0–40.0)

## 2018-02-07 LAB — HEPATIC FUNCTION PANEL
ALT: 19 U/L (ref 0–35)
AST: 17 U/L (ref 0–37)
Albumin: 4.1 g/dL (ref 3.5–5.2)
Alkaline Phosphatase: 36 U/L — ABNORMAL LOW (ref 39–117)
Bilirubin, Direct: 0.1 mg/dL (ref 0.0–0.3)
Total Bilirubin: 0.5 mg/dL (ref 0.2–1.2)
Total Protein: 7 g/dL (ref 6.0–8.3)

## 2018-02-07 LAB — TSH: TSH: 3.58 u[IU]/mL (ref 0.35–4.50)

## 2018-02-15 ENCOUNTER — Ambulatory Visit (INDEPENDENT_AMBULATORY_CARE_PROVIDER_SITE_OTHER): Payer: Medicare Other | Admitting: Internal Medicine

## 2018-02-15 ENCOUNTER — Encounter: Payer: Self-pay | Admitting: Internal Medicine

## 2018-02-15 DIAGNOSIS — E042 Nontoxic multinodular goiter: Secondary | ICD-10-CM

## 2018-02-15 DIAGNOSIS — R5382 Chronic fatigue, unspecified: Secondary | ICD-10-CM | POA: Diagnosis not present

## 2018-02-15 DIAGNOSIS — E034 Atrophy of thyroid (acquired): Secondary | ICD-10-CM | POA: Diagnosis not present

## 2018-02-15 DIAGNOSIS — I10 Essential (primary) hypertension: Secondary | ICD-10-CM | POA: Diagnosis not present

## 2018-02-15 NOTE — Assessment & Plan Note (Signed)
NAS diet 

## 2018-02-15 NOTE — Progress Notes (Signed)
Subjective:  Patient ID: [name], female    DOB: 04/14/1936  Age: 82 y.o. MRN: [id number]  CC: No chief complaint on file.   HPI [name] presents for HTN, hypothyroidism, anxiety C/o fatigue and achy feeling C/o post-nasal drip - chronic   Outpatient Medications Prior to Visit  Medication Sig Dispense Refill  . albuterol (PROVENTIL HFA;VENTOLIN HFA) 108 (90 BASE) MCG/ACT inhaler Inhale 2 puffs into the lungs every 6 (six) hours as needed for wheezing. 1 Inhaler 3  . amoxicillin (AMOXIL) 500 MG capsule Reported on 06/11/2016    . aspirin 81 MG tablet Take 81 mg by mouth daily.      . budesonide-formoterol (SYMBICORT) 160-4.5 MCG/ACT inhaler Inhale 2 puffs 2 (two) times daily into the lungs. 1 Inhaler 3  . Calcium Carbonate-Vitamin D (CALCIUM-VITAMIN D) 500-200 MG-UNIT per tablet Take 3 tablets by mouth daily.     . Cholecalciferol 4000 units TABS Take 1 tablet by mouth daily.    [name] dexlansoprazole (DEXILANT) 60 MG capsule TAKE (1) CAPSULE DAILY. 30 capsule 11  . EPIPEN 2-PAK 0.3 MG/0.3ML SOAJ injection Inject 0.3 mg as directed as needed.     [name] ESTRACE VAGINAL 0.1 MG/GM vaginal cream PLACE 1 APPLICATORFUL VAGINALLY 3 TIMES A WEEK. 42.5 g 3  . fexofenadine (ALLEGRA) 180 MG tablet Take 180 mg by mouth.    . fluticasone (FLONASE) 50 MCG/ACT nasal spray USE 2 SPRAYS IN EACH NOSTRIL ONCE DAILY 16 g 0  . ipratropium (ATROVENT) 0.06 % nasal spray Place 1 spray into the nose 3 (three) times daily as needed for rhinitis. 15 mL 0  . levothyroxine (SYNTHROID) 50 MCG tablet TAKE 1 TABLET ONCE DAILY BEFORE BREAKFAST. 30 tablet 11  . LORazepam (ATIVAN) 1 MG tablet Take 1-2 tablets (1-2 mg total) by mouth 2 (two) times daily as needed for anxiety. 100 tablet 3  . mometasone (NASONEX) 50 MCG/ACT nasal spray Place 2 sprays into the nose daily.    . montelukast (SINGULAIR) 10 MG tablet Take 10 mg by mouth at bedtime.     [name] nystatin-triamcinolone ointment (MYCOLOG) Apply 1 application topically 2  (two) times daily. 30 g 1  . Phosphatidylserine-DHA-EPA (VAYACOG) 100-19.5-6.5 MG CAPS Take 1 capsule by mouth daily. 90 capsule 3  . ranitidine (ZANTAC) 150 MG tablet     . SALINE NASAL SPRAY NA Place into the nose.    . simvastatin (ZOCOR) 40 MG tablet TAKE 1 TABLET ONCE DAILY. 90 tablet 1  . VESICARE 10 MG tablet Take 1 tablet by mouth every other day.     [name] azithromycin (ZITHROMAX Z-PAK) 250 MG tablet As directed 6 each 0  . methylPREDNISolone (MEDROL DOSEPAK) 4 MG TBPK tablet As directed 21 tablet 0  . promethazine-codeine (PHENERGAN WITH CODEINE) 6.25-10 MG/5ML syrup Take 5 mLs every 4 (four) hours as needed by mouth. 300 mL 0   Facility-Administered Medications Prior to Visit  Medication Dose Route Frequency Provider Last Rate Last Dose  . triamcinolone acetonide (KENALOG) 10 MG/ML injection 10 mg  10 mg Other Once Regal, Norman S, [name]      . triamcinolone acetonide (KENALOG) 10 MG/ML injection 10 mg  10 mg Other Once [name], [name]        ROS Review of Systems  Constitutional: Positive for fatigue. Negative for activity change, appetite change, chills and unexpected weight change.  HENT: Negative for congestion, mouth sores and sinus pressure.   Eyes: Negative for visual disturbance.  Respiratory: Negative for cough and  chest tightness.   Gastrointestinal: Negative for abdominal pain and nausea.  Genitourinary: Negative for difficulty urinating, frequency and vaginal pain.  Musculoskeletal: Positive for arthralgias. Negative for back pain and gait problem.  Skin: Negative for pallor and rash.  Neurological: Negative for dizziness, tremors, weakness, numbness and headaches.  Psychiatric/Behavioral: Negative for confusion and sleep disturbance.    Objective:  BP 118/74 (BP Location: Left Arm, Patient Position: Sitting, Cuff Size: Normal)   Pulse 79   Temp 98 F (36.7 C) (Oral)   Ht 5' 0.05" (1.525 m)   SpO2 98%   BMI 24.37 kg/m   BP Readings from Last 3 Encounters:   02/15/18 118/74  10/24/17 116/76  10/07/17 124/82    Wt Readings from Last 3 Encounters:  04/07/17 125 lb (56.7 kg)  04/01/17 122 lb (55.3 kg)  08/12/16 127 lb (57.6 kg)    Physical Exam  Constitutional: She appears well-developed. No distress.  HENT:  Head: Normocephalic.  Right Ear: External ear normal.  Left Ear: External ear normal.  Nose: Nose normal.  Mouth/Throat: Oropharynx is clear and moist.  Eyes: Conjunctivae are normal. Pupils are equal, round, and reactive to light. Right eye exhibits no discharge. Left eye exhibits no discharge.  Neck: Normal range of motion. Neck supple. No JVD present. No tracheal deviation present. No thyromegaly present.  Cardiovascular: Normal rate, regular rhythm and normal heart sounds.  Pulmonary/Chest: No stridor. No respiratory distress. She has no wheezes.  Abdominal: Soft. Bowel sounds are normal. She exhibits no distension and no mass. There is no tenderness. There is no rebound and no guarding.  Musculoskeletal: She exhibits no edema or tenderness.  Lymphadenopathy:    She has no cervical adenopathy.  Neurological: She displays normal reflexes. No cranial nerve deficit. She exhibits normal muscle tone. Coordination normal.  Skin: No rash noted. No erythema.  Psychiatric: She has a normal mood and affect. Her behavior is normal. Judgment and thought content normal.    Lab Results  Component Value Date   WBC 5.6 02/07/2018   HGB 13.3 02/07/2018   HCT 39.1 02/07/2018   PLT 255.0 02/07/2018   GLUCOSE 95 02/07/2018   CHOL 188 02/07/2018   TRIG 146.0 02/07/2018   HDL 53.80 02/07/2018   LDLCALC 105 (H) 02/07/2018   ALT 19 02/07/2018   AST 17 02/07/2018   NA 139 02/07/2018   K 4.0 02/07/2018   CL 103 02/07/2018   CREATININE 0.64 02/07/2018   BUN 14 02/07/2018   CO2 30 02/07/2018   TSH 3.58 02/07/2018   HGBA1C 6.0 07/08/2016    Dg Chest 2 View  Result Date: 10/05/2017 CLINICAL DATA:  Cough congestion and chest pain for 1  week. EXAM: CHEST  2 VIEW COMPARISON:  01/21/2015 chest CT and 08/20/2013 chest radiograph FINDINGS: The cardiomediastinal silhouette is unremarkable. Mild peribronchial thickening again noted. There is no evidence of focal airspace disease, pulmonary edema, suspicious pulmonary nodule/mass, pleural effusion, or pneumothorax. No acute bony abnormalities are identified. IMPRESSION: No active cardiopulmonary disease. Electronically Signed   By: Margarette Canada M.D.   On: 10/05/2017 16:13    Assessment & Plan:   There are no diagnoses linked to this encounter. I have discontinued Murray Hodgkins Stipes's promethazine-codeine, methylPREDNISolone, and azithromycin. I am also having her maintain her aspirin, calcium-vitamin D, SALINE NASAL SPRAY NA, albuterol, ipratropium, EPIPEN 2-PAK, montelukast, amoxicillin, VESICARE, fluticasone, ESTRACE VAGINAL, Cholecalciferol, Phosphatidylserine-DHA-EPA, fexofenadine, nystatin-triamcinolone ointment, dexlansoprazole, budesonide-formoterol, mometasone, simvastatin, LORazepam, levothyroxine, and ranitidine. We will continue to administer triamcinolone acetonide and  triamcinolone acetonide.  No orders of the defined types were placed in this encounter.    Follow-up: No Follow-up on file.  Walker Kehr, MD

## 2018-02-15 NOTE — Assessment & Plan Note (Signed)
TSH ok

## 2018-02-15 NOTE — Assessment & Plan Note (Signed)
US

## 2018-02-15 NOTE — Assessment & Plan Note (Signed)
Labs ok Rest more

## 2018-03-01 ENCOUNTER — Ambulatory Visit (HOSPITAL_BASED_OUTPATIENT_CLINIC_OR_DEPARTMENT_OTHER)
Admission: RE | Admit: 2018-03-01 | Discharge: 2018-03-01 | Disposition: A | Discharge status: Home / Self Care | Payer: Medicare Other | Source: Ambulatory Visit | Attending: Internal Medicine | Admitting: Internal Medicine

## 2018-03-01 ENCOUNTER — Ambulatory Visit (HOSPITAL_COMMUNITY)
Admission: RE | Admit: 2018-03-01 | Discharge: 2018-03-01 | Disposition: A | Discharge status: Home / Self Care | Payer: Medicare Other | Source: Ambulatory Visit | Attending: Internal Medicine | Admitting: Internal Medicine

## 2018-03-01 ENCOUNTER — Encounter (HOSPITAL_COMMUNITY): Payer: Self-pay | Admitting: Internal Medicine

## 2018-03-01 ENCOUNTER — Other Ambulatory Visit: Payer: Self-pay

## 2018-03-01 VITALS — BP 133/64 | HR 72

## 2018-03-01 DIAGNOSIS — E782 Mixed hyperlipidemia: Secondary | ICD-10-CM | POA: Diagnosis not present

## 2018-03-01 DIAGNOSIS — Z7982 Long term (current) use of aspirin: Secondary | ICD-10-CM | POA: Diagnosis not present

## 2018-03-01 DIAGNOSIS — N3281 Overactive bladder: Secondary | ICD-10-CM | POA: Insufficient documentation

## 2018-03-01 DIAGNOSIS — I080 Rheumatic disorders of both mitral and aortic valves: Secondary | ICD-10-CM | POA: Insufficient documentation

## 2018-03-01 DIAGNOSIS — Z86010 Personal history of colonic polyps: Secondary | ICD-10-CM | POA: Insufficient documentation

## 2018-03-01 DIAGNOSIS — K219 Gastro-esophageal reflux disease without esophagitis: Secondary | ICD-10-CM | POA: Insufficient documentation

## 2018-03-01 DIAGNOSIS — Z79899 Other long term (current) drug therapy: Secondary | ICD-10-CM | POA: Insufficient documentation

## 2018-03-01 DIAGNOSIS — K589 Irritable bowel syndrome without diarrhea: Secondary | ICD-10-CM | POA: Insufficient documentation

## 2018-03-01 DIAGNOSIS — M1990 Unspecified osteoarthritis, unspecified site: Secondary | ICD-10-CM | POA: Insufficient documentation

## 2018-03-01 DIAGNOSIS — I358 Other nonrheumatic aortic valve disorders: Secondary | ICD-10-CM

## 2018-03-01 DIAGNOSIS — I10 Essential (primary) hypertension: Secondary | ICD-10-CM

## 2018-03-01 DIAGNOSIS — E079 Disorder of thyroid, unspecified: Secondary | ICD-10-CM | POA: Insufficient documentation

## 2018-03-01 DIAGNOSIS — Z7951 Long term (current) use of inhaled steroids: Secondary | ICD-10-CM | POA: Diagnosis not present

## 2018-03-01 DIAGNOSIS — E785 Hyperlipidemia, unspecified: Secondary | ICD-10-CM | POA: Insufficient documentation

## 2018-03-01 DIAGNOSIS — Z79890 Hormone replacement therapy (postmenopausal): Secondary | ICD-10-CM | POA: Insufficient documentation

## 2018-03-01 NOTE — Addendum Note (Signed)
Encounter addended by: Harvie Junior, CMA on: 03/01/2018 10:58 AM  Actions taken: Sign clinical note

## 2018-03-01 NOTE — Progress Notes (Signed)
  Echocardiogram 2D Echocardiogram has been performed.  [name] 03/01/2018, 9:51 AM

## 2018-03-01 NOTE — Progress Notes (Signed)
Patient ID: [name], female   DOB: [birth year], 82 y.o.   MRN: [id number]  HPI:  [name] is a 82 year old woman with a history of hyperlipidemia, mild mitral valve prolapse with mild mitral regurgitation (echo 12/11).  She has undergone CT angiogram at the [facility] about 10 years ago which was totally normal.   She returns today for regular follow up. Overall doing great. Very active.  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 No CP, SOB, dizziness, palpitations. No syncope or presyncope. Compliant with medications. Checks BP occasionally at home when she has a headache and gets 140-150s. Does not otherwise check.   Echo 02/2017: EF 55%Trivial MVP Mild MR Calcified Aov Moves well. Mild AI. No AS Echo 02/2018: EF 60% Mild MR Calcified AoV Very mild AS mean gradient 4 Mild AI Grade I DD Personally reviewed   Lab Results  Component Value Date   CHOL 188 02/07/2018   HDL 53.80 02/07/2018   LDLCALC 105 (H) 02/07/2018   TRIG 146.0 02/07/2018   CHOLHDL 3 02/07/2018    ROS: All systems negative except as listed in HPI, PMH and Problem List.  Past Medical History:  Diagnosis Date  . Allergic rhinitis   . Atrophic vaginitis   . Baker's cyst    Left-Dr. Aluisio  . Cataract    Dr. Katy Fitch  . Colon polyps   . Fibroid   . Gastritis    chronic  . GERD (gastroesophageal reflux disease)   . Heart murmur   . Hemorrhoids   . Hyperlipidemia   . IBS (irritable bowel syndrome)   . MVP (mitral valve prolapse)    Antibiotics required for dental procedures  . OA (osteoarthritis)   . Osteoporosis 05/2014   T score distal third radius -2.5  Spine, left and right hip all show statistically significant improvement.  . Overactive bladder   . Thyroid disease   . Thyroid nodule   . Torn meniscus    bilateral    Current Outpatient Medications  Medication Sig Dispense Refill  . albuterol (PROVENTIL HFA;VENTOLIN HFA) 108 (90 BASE) MCG/ACT inhaler Inhale 2 puffs into the lungs  every 6 (six) hours as needed for wheezing. 1 Inhaler 3  . amoxicillin (AMOXIL) 500 MG capsule Reported on 06/11/2016    . aspirin 81 MG tablet Take 81 mg by mouth daily.      . budesonide-formoterol (SYMBICORT) 160-4.5 MCG/ACT inhaler Inhale 2 puffs 2 (two) times daily into the lungs. 1 Inhaler 3  . Calcium Carbonate-Vitamin D (CALCIUM-VITAMIN D) 500-200 MG-UNIT per tablet Take 3 tablets by mouth daily.     . Cholecalciferol 4000 units TABS Take 1 tablet by mouth daily.    [name] dexlansoprazole (DEXILANT) 60 MG capsule TAKE (1) CAPSULE DAILY. 30 capsule 11  . EPIPEN 2-PAK 0.3 MG/0.3ML SOAJ injection Inject 0.3 mg as directed as needed.     [name] ESTRACE VAGINAL 0.1 MG/GM vaginal cream PLACE 1 APPLICATORFUL VAGINALLY 3 TIMES A WEEK. 42.5 g 3  . fexofenadine (ALLEGRA) 180 MG tablet Take 180 mg by mouth.    . fluticasone (FLONASE) 50 MCG/ACT nasal spray USE 2 SPRAYS IN EACH NOSTRIL ONCE DAILY 16 g 0  . ipratropium (ATROVENT) 0.06 % nasal spray Place 1 spray into the nose 3 (three) times daily as needed for rhinitis. 15 mL 0  . levothyroxine (SYNTHROID) 50 MCG tablet TAKE 1 TABLET ONCE DAILY BEFORE BREAKFAST. 30 tablet 11  . LORazepam (ATIVAN) 1 MG tablet Take 1-2 tablets (1-2  mg total) by mouth 2 (two) times daily as needed for anxiety. 100 tablet 3  . mometasone (NASONEX) 50 MCG/ACT nasal spray Place 2 sprays into the nose daily.    . montelukast (SINGULAIR) 10 MG tablet Take 10 mg by mouth at bedtime.     [name] nystatin-triamcinolone ointment (MYCOLOG) Apply 1 application topically 2 (two) times daily. 30 g 1  . Phosphatidylserine-DHA-EPA (VAYACOG) 100-19.5-6.5 MG CAPS Take 1 capsule by mouth daily. 90 capsule 3  . ranitidine (ZANTAC) 150 MG tablet     . SALINE NASAL SPRAY NA Place into the nose.    . simvastatin (ZOCOR) 40 MG tablet TAKE 1 TABLET ONCE DAILY. 90 tablet 1  . VESICARE 10 MG tablet Take 1 tablet by mouth every other day.      Current Facility-Administered Medications  Medication Dose Route  Frequency Provider Last Rate Last Dose  . triamcinolone acetonide (KENALOG) 10 MG/ML injection 10 mg  10 mg Other Once Regal, Norman S, [name]      . triamcinolone acetonide (KENALOG) 10 MG/ML injection 10 mg  10 mg Other Once [name], [name]        Filed Weights    PHYSICAL EXAM: Vitals:   03/01/18 1005  BP: 133/64  Pulse: 72  SpO2: 100%   General: Well appearing. No resp difficulty. Looks younger than stated age  1: Normal Neck: Supple. JVP flat. Carotids 2+ bilat; no bruits. No thyromegaly or nodule noted. Cor: PMI nondisplaced. RRR, minimal AS murmur. S2 crisp Lungs: CTAB, normal effort. Abdomen: Soft, non-tender, non-distended, no HSM. No bruits or masses. +BS  Extremities: No cyanosis, clubbing, or rash. R and LLE no edema.  Neuro: Alert & orientedx3, cranial nerves grossly intact. moves all 4 extremities w/o difficulty. Affect pleasant  EKG: NSR 72. Personally reviewed.   ASSESSMENT & PLAN: 1. Aortic valve thickening with very mild AS - Mild AI on echo 02/2017  2. MVP, mild - Mild MR on echo 02/2017  3. Hyperlipidemia - LDL 105 01/2018 - Continue simvastatin 40 mg. May need to increase.   4. HTN - Slightly elevated today, but SBP typically 110-120s at previous visits - SBP 140-150s occasionally at home.   DC aspirin  Georgiana Shore, NP 10:21 AM   Patient seen and examined with the above-signed Advanced Practice Provider and/or Housestaff. I personally reviewed laboratory data, imaging studies and relevant notes. I independently examined the patient and formulated the important aspects of the plan. I have edited the note to reflect any of my changes or salient points. I have personally discussed the plan with the patient and/or family.  She is doing great. Very active. No symptoms. Echo reviewed personally. AoV is calcified but no significant AS. Will follow yearly. BP and cholesterol look good can stop ASA given recent data on primary prevention.   Glori Bickers, MD  10:54 AM

## 2018-03-01 NOTE — Addendum Note (Signed)
Encounter addended by: Jolaine Artist, MD on: 03/01/2018 10:56 AM  Actions taken: LOS modified

## 2018-03-01 NOTE — Addendum Note (Signed)
Encounter addended by: Harvie Junior, CMA on: 03/01/2018 11:00 AM  Actions taken: Order list changed, Diagnosis association updated

## 2018-03-01 NOTE — Patient Instructions (Signed)
Follow up and echocardiogram with Dr.Bensimhon in 1 year. **Please contact our office at [phone] in March of 2020 for April 2020 appointment**

## 2018-03-02 ENCOUNTER — Ambulatory Visit (INDEPENDENT_AMBULATORY_CARE_PROVIDER_SITE_OTHER): Payer: Medicare Other | Admitting: Gynecology

## 2018-03-02 DIAGNOSIS — M810 Age-related osteoporosis without current pathological fracture: Secondary | ICD-10-CM

## 2018-03-02 MED ORDER — DENOSUMAB 60 MG/ML ~~LOC~~ SOLN
60.0000 mg | Freq: Once | SUBCUTANEOUS | Status: AC
  Administered 2018-03-02: 60 mg via SUBCUTANEOUS

## 2018-03-03 ENCOUNTER — Ambulatory Visit: Payer: Medicare Other

## 2018-03-06 ENCOUNTER — Encounter: Payer: Self-pay | Admitting: Gynecology

## 2018-03-06 NOTE — Telephone Encounter (Signed)
Prolia Given 03/02/18 Next injection 09/02/18 

## 2018-03-15 ENCOUNTER — Other Ambulatory Visit: Payer: Medicare Other

## 2018-03-15 DIAGNOSIS — L309 Dermatitis, unspecified: Secondary | ICD-10-CM | POA: Diagnosis not present

## 2018-03-15 DIAGNOSIS — B029 Zoster without complications: Secondary | ICD-10-CM | POA: Diagnosis not present

## 2018-03-21 ENCOUNTER — Ambulatory Visit
Admission: RE | Admit: 2018-03-21 | Discharge: 2018-03-21 | Disposition: A | Discharge status: Home / Self Care | Payer: Medicare Other | Source: Ambulatory Visit | Attending: Internal Medicine | Admitting: Internal Medicine

## 2018-03-21 ENCOUNTER — Encounter (INDEPENDENT_AMBULATORY_CARE_PROVIDER_SITE_OTHER): Payer: Self-pay

## 2018-03-21 DIAGNOSIS — E041 Nontoxic single thyroid nodule: Secondary | ICD-10-CM | POA: Diagnosis not present

## 2018-03-22 ENCOUNTER — Encounter: Payer: Self-pay | Admitting: Internal Medicine

## 2018-03-28 ENCOUNTER — Telehealth: Payer: Self-pay | Admitting: Internal Medicine

## 2018-03-28 NOTE — Telephone Encounter (Signed)
Betty, Pls inform the pt - [place] results. Thx  Dear [name],  Your thyroid ultrasound reveals goiter unchanged from before. We will need to repeat your thyroid ultrasound in 2-3 years.  Sincerely,  Walker Kehr, MD

## 2018-03-29 NOTE — Telephone Encounter (Signed)
Pt saw results via mychart

## 2018-04-05 ENCOUNTER — Ambulatory Visit (INDEPENDENT_AMBULATORY_CARE_PROVIDER_SITE_OTHER): Payer: Medicare Other | Admitting: Gynecology

## 2018-04-05 ENCOUNTER — Encounter: Payer: Self-pay | Admitting: Gynecology

## 2018-04-05 VITALS — BP 118/74 | Ht 60.0 in

## 2018-04-05 DIAGNOSIS — Z01419 Encounter for gynecological examination (general) (routine) without abnormal findings: Secondary | ICD-10-CM | POA: Diagnosis not present

## 2018-04-05 DIAGNOSIS — N952 Postmenopausal atrophic vaginitis: Secondary | ICD-10-CM

## 2018-04-05 DIAGNOSIS — R102 Pelvic and perineal pain: Secondary | ICD-10-CM | POA: Diagnosis not present

## 2018-04-05 DIAGNOSIS — Z1272 Encounter for screening for malignant neoplasm of vagina: Secondary | ICD-10-CM

## 2018-04-05 DIAGNOSIS — M810 Age-related osteoporosis without current pathological fracture: Secondary | ICD-10-CM

## 2018-04-05 NOTE — Progress Notes (Signed)
[name] 21-Jun-1936 [id number]        82 y.o.  [id number] for breast and pelvic exam.  Patient also asked about running a urine analysis.  Having some fleeting suprapubic discomfort that comes and goes but is not lasting.  Has been going on for a long time.  No dysuria frequency urgency low back pain fever or chills.  No nausea vomiting diarrhea constipation.  Also asked about cheaper alternatives for Premarin vaginal cream that she uses once weekly for vaginal dryness.  Past medical history,surgical history, problem list, medications, allergies, family history and social history were all reviewed and documented as reviewed in the EPIC chart.  ROS:  Performed with pertinent positives and negatives included in the history, assessment and plan.   Additional significant findings : None   Exam: Kennon Portela assistant Vitals:   04/05/18 0921  BP: 118/74  Height: 5' (1.524 m)   Body mass index is 24.41 kg/m.  General appearance:  Normal affect, orientation and appearance. Skin: Grossly normal HEENT: Without gross lesions.  No cervical or supraclavicular adenopathy. Thyroid normal.  Lungs:  Clear without wheezing, rales or rhonchi Cardiac: RR, without RMG Abdominal:  Soft, nontender, without masses, guarding, rebound, organomegaly or hernia Breasts:  Examined lying and sitting without masses, retractions, discharge or axillary adenopathy. Pelvic:  Ext, BUS, Vagina: With atrophic changes  Cervix: With atrophic changes.  Pap smear done  Uterus: Anteverted, normal size, shape and contour, midline and mobile nontender   Adnexa: Without masses or tenderness    Anus and perineum: Normal   Rectovaginal: Normal sphincter tone without palpated masses or tenderness.    Assessment/Plan:  82 y.o. [id number] female for breast and pelvic exam.   1. Postmenopausal/atrophic genital changes.  Uses Premarin cream for vaginal dryness.  We discussed the issues of vaginal cream absorption and systemic  effects to include thrombosis breast and endometrial stimulation.  Alternatives to include formulated estradiol cream discussed.  Patient interested in trying this.  Requests prescription to [facility].  No significant hot flushes, night sweats or any vaginal bleeding.  She knows the need to report any vaginal bleeding. 2. Suprapubic fleeting pressure.  Exam is normal.  No associated symptoms to suggest GU or GI.  Urine analysis is negative.  As this is been chronic and not consistent we will monitor for now and she will follow-up if any more significant symptoms occur. 3. Osteoporosis.  DEXA 2017 T score -3 forearm -2.5 right femoral neck -2.2 left femoral neck.  Used Fosamax a number of years ago.  Approaching 2 years of Prolia.  Recheck DEXA at the end of the summer which will be at a 2-year interval.  Patient will schedule now for that.  She will continue on Prolia. 4. Mammography coming due in September and I reminded her to follow-up for this.  Breast exam normal today. 5. Pap smear 2018.  Pap smear done today at patient's request.  No history of abnormal Pap smears.  We reviewed current screening guidelines and options to stop screening based on age and she is uncomfortable with this. 6. Colonoscopy 2013.  Repeat at their recommended interval. 7. Health maintenance.  No routine lab work done as patient does this elsewhere.  Follow-up 1 year, sooner as needed.  Additional time in excess of her breast and pelvic exam was spent in direct face to face counseling and coordination of care in regards to her vaginal estrogen alternatives and suprapubic pressure.    [name] P  [name] Runion MD, 9:47 AM 04/05/2018

## 2018-04-05 NOTE — Patient Instructions (Signed)
Schedule your bone density for the end of the summer.  Start on the new vaginal estrogen cream as prescribed.  Call if you have any issues with that.

## 2018-04-05 NOTE — Addendum Note (Signed)
Addended by: Nelva Nay on: 04/05/2018 10:12 AM   Modules accepted: Orders

## 2018-04-06 ENCOUNTER — Telehealth: Payer: Self-pay | Admitting: *Deleted

## 2018-04-06 LAB — URINALYSIS, COMPLETE W/RFL CULTURE
Bacteria, UA: NONE SEEN /HPF
Bilirubin Urine: NEGATIVE
Glucose, UA: NEGATIVE
Hgb urine dipstick: NEGATIVE
Hyaline Cast: NONE SEEN /LPF
Ketones, ur: NEGATIVE
Leukocyte Esterase: NEGATIVE
Nitrites, Initial: NEGATIVE
Protein, ur: NEGATIVE
RBC / HPF: NONE SEEN /HPF (ref 0–2)
Specific Gravity, Urine: 1.01 (ref 1.001–1.03)
WBC, UA: NONE SEEN /HPF (ref 0–5)
pH: 5 (ref 5.0–8.0)

## 2018-04-06 LAB — NO CULTURE INDICATED

## 2018-04-06 MED ORDER — NONFORMULARY OR COMPOUNDED ITEM: 3 refills | Status: DC

## 2018-04-06 NOTE — Telephone Encounter (Signed)
Rx called in 

## 2018-04-06 NOTE — Telephone Encounter (Signed)
-----   Message from Anastasio Auerbach, MD sent at 04/05/2018 10:00 AM EDT ----- [facility] and call into [facility] estradiol prefilled syringes twice weekly 47-month supply refill x1 year

## 2018-04-07 LAB — PAP IG W/ RFLX HPV ASCU

## 2018-04-11 ENCOUNTER — Telehealth: Payer: Self-pay | Admitting: Internal Medicine

## 2018-04-11 DIAGNOSIS — I10 Essential (primary) hypertension: Secondary | ICD-10-CM

## 2018-04-11 DIAGNOSIS — E039 Hypothyroidism, unspecified: Secondary | ICD-10-CM

## 2018-04-11 NOTE — Telephone Encounter (Signed)
Patient is scheduled to see Dr Alain Marion on 06/14/2018 and would like to have labs done prior to this visit. Can these orders be put in for him?

## 2018-04-26 NOTE — Addendum Note (Signed)
Addended by: Karren Cobble on: 04/26/2018 01:18 PM   Modules accepted: Orders

## 2018-04-26 NOTE — Telephone Encounter (Signed)
Labs entered.

## 2018-05-19 ENCOUNTER — Encounter: Payer: Self-pay | Admitting: Internal Medicine

## 2018-05-19 NOTE — Telephone Encounter (Signed)
Called patient. Offered her an appointment on Monday with Dr Alain Marion. She said that she needed to be seen today and was on hold with currently with the [facility]. She said that they were looking at other offices for openings this afternoon and switched the call back to them.

## 2018-05-20 ENCOUNTER — Telehealth: Payer: Self-pay | Admitting: Internal Medicine

## 2018-05-20 ENCOUNTER — Ambulatory Visit: Payer: Medicare Other | Admitting: Family Medicine

## 2018-05-20 NOTE — Telephone Encounter (Signed)
Patient called and cancel her appointment at 10:15am  at 9:08am, Patient adv she was feeling better. See past appointment for details, Patient call back at 10am and stts she started feeling bad again and she would like to make another appointment. Patient spoke with CMA- Devontenno and was adv to go to ER. Patient decline and requested for me to make an appointment w/ PCP . Adv patient I was unable to make appointment, she can do it though MyChart for Monday.

## 2018-05-20 NOTE — Progress Notes (Deleted)
OFFICE VISIT  05/20/2018   CC: No chief complaint on file.    HPI:    Patient is a 82 y.o. Caucasian female who presents for ***  In review of EMR, all BP's have been normal at past visits.   Past Medical History:  Diagnosis Date  . Allergic rhinitis   . Atrophic vaginitis   . Baker's cyst    Left-Dr. Aluisio  . Cataract    Dr. Katy Fitch  . Colon polyps   . Fibroid   . Gastritis    chronic  . GERD (gastroesophageal reflux disease)   . Heart murmur   . Hemorrhoids   . Hyperlipidemia   . IBS (irritable bowel syndrome)   . MVP (mitral valve prolapse)    Antibiotics required for dental procedures  . OA (osteoarthritis)   . Osteoporosis 05/2014   T score distal third radius -2.5  Spine, left and right hip all show statistically significant improvement.  . Overactive bladder   . Thyroid disease   . Thyroid nodule   . Torn meniscus    bilateral    Past Surgical History:  Procedure Laterality Date  . CATARACT EXTRACTION, BILATERAL    . TONSILLECTOMY      Outpatient Medications Prior to Visit  Medication Sig Dispense Refill  . albuterol (PROVENTIL HFA;VENTOLIN HFA) 108 (90 BASE) MCG/ACT inhaler Inhale 2 puffs into the lungs every 6 (six) hours as needed for wheezing. 1 Inhaler 3  . amoxicillin (AMOXIL) 500 MG capsule Reported on 06/11/2016    . budesonide-formoterol (SYMBICORT) 160-4.5 MCG/ACT inhaler Inhale 2 puffs 2 (two) times daily into the lungs. 1 Inhaler 3  . Calcium Carbonate-Vitamin D (CALCIUM-VITAMIN D) 500-200 MG-UNIT per tablet Take 3 tablets by mouth daily.     . Cholecalciferol 4000 units TABS Take 1 tablet by mouth daily.    [name] dexlansoprazole (DEXILANT) 60 MG capsule TAKE (1) CAPSULE DAILY. 30 capsule 11  . EPIPEN 2-PAK 0.3 MG/0.3ML SOAJ injection Inject 0.3 mg as directed as needed.     [name] ESTRACE VAGINAL 0.1 MG/GM vaginal cream PLACE 1 APPLICATORFUL VAGINALLY 3 TIMES A WEEK. 42.5 g 3  . fexofenadine (ALLEGRA) 180 MG tablet Take 180 mg by mouth.    .  fluticasone (FLONASE) 50 MCG/ACT nasal spray USE 2 SPRAYS IN EACH NOSTRIL ONCE DAILY 16 g 0  . ipratropium (ATROVENT) 0.06 % nasal spray Place 1 spray into the nose 3 (three) times daily as needed for rhinitis. 15 mL 0  . levothyroxine (SYNTHROID) 50 MCG tablet TAKE 1 TABLET ONCE DAILY BEFORE BREAKFAST. 30 tablet 11  . LORazepam (ATIVAN) 1 MG tablet Take 1-2 tablets (1-2 mg total) by mouth 2 (two) times daily as needed for anxiety. 100 tablet 3  . mometasone (NASONEX) 50 MCG/ACT nasal spray Place 2 sprays into the nose daily.    . montelukast (SINGULAIR) 10 MG tablet Take 10 mg by mouth at bedtime.     . NONFORMULARY OR COMPOUNDED ITEM Estradiol 0.02% vaginal cream insert 1 applicator vaginal twice weekly 90 each 3  . nystatin-triamcinolone ointment (MYCOLOG) Apply 1 application topically 2 (two) times daily. 30 g 1  . Phosphatidylserine-DHA-EPA (VAYACOG) 100-19.5-6.5 MG CAPS Take 1 capsule by mouth daily. 90 capsule 3  . ranitidine (ZANTAC) 150 MG tablet     . SALINE NASAL SPRAY NA Place into the nose.    . simvastatin (ZOCOR) 40 MG tablet TAKE 1 TABLET ONCE DAILY. 90 tablet 1  . VESICARE 10 MG tablet Take 1 tablet by  mouth every other day.      Facility-Administered Medications Prior to Visit  Medication Dose Route Frequency Provider Last Rate Last Dose  . triamcinolone acetonide (KENALOG) 10 MG/ML injection 10 mg  10 mg Other Once Regal, Norman S, [name]      . triamcinolone acetonide (KENALOG) 10 MG/ML injection 10 mg  10 mg Other Once [name], [name]        Allergies  Allergen Reactions  . Bee Venom Itching, Swelling and Rash    Itching, swelling and rash with bee stings, patient has epi pen  . Doxycycline     ??  . Neosporin [Neomycin-Bacitracin Zn-Polymyx]     ROS As per HPI  PE: There were no vitals taken for this visit. ***  LABS:    Chemistry      Component Value Date/Time   NA 139 02/07/2018 0912   K 4.0 02/07/2018 0912   CL 103 02/07/2018 0912   CO2 30  02/07/2018 0912   BUN 14 02/07/2018 0912   CREATININE 0.64 02/07/2018 0912      Component Value Date/Time   CALCIUM 9.3 02/07/2018 0912   ALKPHOS 36 (L) 02/07/2018 0912   AST 17 02/07/2018 0912   ALT 19 02/07/2018 0912   BILITOT 0.5 02/07/2018 0912     Lab Results  Component Value Date   HGBA1C 6.0 07/08/2016   Lab Results  Component Value Date   WBC 5.6 02/07/2018   HGB 13.3 02/07/2018   HCT 39.1 02/07/2018   MCV 94.4 02/07/2018   PLT 255.0 02/07/2018   Lab Results  Component Value Date   TSH 3.58 02/07/2018    IMPRESSION AND PLAN:  No problem-specific Assessment & Plan notes found for this encounter.    An After Visit Summary was printed and given to the patient.  FOLLOW UP: No follow-ups on file.  Signed:  Crissie Sickles, MD           05/20/2018

## 2018-05-23 ENCOUNTER — Ambulatory Visit: Payer: Medicare Other | Admitting: Family

## 2018-05-24 ENCOUNTER — Ambulatory Visit
Admission: RE | Admit: 2018-05-24 | Discharge: 2018-05-24 | Disposition: A | Discharge status: Home / Self Care | Payer: Medicare Other | Source: Ambulatory Visit | Attending: *Deleted | Admitting: *Deleted

## 2018-05-24 ENCOUNTER — Other Ambulatory Visit: Payer: Self-pay | Admitting: *Deleted

## 2018-05-24 DIAGNOSIS — M5023 Other cervical disc displacement, cervicothoracic region: Secondary | ICD-10-CM | POA: Diagnosis not present

## 2018-05-24 DIAGNOSIS — M545 Low back pain: Secondary | ICD-10-CM

## 2018-05-24 DIAGNOSIS — M5127 Other intervertebral disc displacement, lumbosacral region: Secondary | ICD-10-CM | POA: Diagnosis not present

## 2018-05-24 DIAGNOSIS — M542 Cervicalgia: Secondary | ICD-10-CM

## 2018-05-25 ENCOUNTER — Ambulatory Visit: Payer: Medicare Other | Admitting: Internal Medicine

## 2018-05-26 ENCOUNTER — Other Ambulatory Visit: Payer: Self-pay | Admitting: Internal Medicine

## 2018-05-26 ENCOUNTER — Ambulatory Visit: Payer: Medicare Other | Admitting: Internal Medicine

## 2018-06-07 ENCOUNTER — Other Ambulatory Visit: Payer: Self-pay | Admitting: Internal Medicine

## 2018-06-07 ENCOUNTER — Encounter: Payer: Self-pay | Admitting: Internal Medicine

## 2018-06-07 MED ORDER — DICLOFENAC SODIUM 1 % TD GEL: 1.0000 "application " | Freq: Four times a day (QID) | TRANSDERMAL | 3 refills | Status: DC

## 2018-06-08 ENCOUNTER — Encounter: Payer: Self-pay | Admitting: Internal Medicine

## 2018-06-09 ENCOUNTER — Encounter: Payer: Self-pay | Admitting: Internal Medicine

## 2018-06-09 ENCOUNTER — Other Ambulatory Visit (INDEPENDENT_AMBULATORY_CARE_PROVIDER_SITE_OTHER): Payer: Medicare Other

## 2018-06-09 DIAGNOSIS — E039 Hypothyroidism, unspecified: Secondary | ICD-10-CM | POA: Diagnosis not present

## 2018-06-09 DIAGNOSIS — I10 Essential (primary) hypertension: Secondary | ICD-10-CM | POA: Diagnosis not present

## 2018-06-09 LAB — CBC WITH DIFFERENTIAL/PLATELET
Basophils Absolute: 0.1 10*3/uL (ref 0.0–0.1)
Basophils Relative: 0.8 % (ref 0.0–3.0)
Eosinophils Absolute: 0.2 10*3/uL (ref 0.0–0.7)
Eosinophils Relative: 2.6 % (ref 0.0–5.0)
HCT: 40.6 % (ref 36.0–46.0)
Hemoglobin: 13.9 g/dL (ref 12.0–15.0)
Lymphocytes Relative: 20 % (ref 12.0–46.0)
Lymphs Abs: 1.3 10*3/uL (ref 0.7–4.0)
MCHC: 34.4 g/dL (ref 30.0–36.0)
MCV: 92.4 fl (ref 78.0–100.0)
Monocytes Absolute: 0.7 10*3/uL (ref 0.1–1.0)
Monocytes Relative: 10.9 % (ref 3.0–12.0)
Neutro Abs: 4.4 10*3/uL (ref 1.4–7.7)
Neutrophils Relative %: 65.7 % (ref 43.0–77.0)
Platelets: 281 10*3/uL (ref 150.0–400.0)
RBC: 4.39 Mil/uL (ref 3.87–5.11)
RDW: 13.3 % (ref 11.5–15.5)
WBC: 6.7 10*3/uL (ref 4.0–10.5)

## 2018-06-09 LAB — BASIC METABOLIC PANEL
BUN: 18 mg/dL (ref 6–23)
CO2: 24 mEq/L (ref 19–32)
Calcium: 9.3 mg/dL (ref 8.4–10.5)
Chloride: 101 mEq/L (ref 96–112)
Creatinine, Ser: 0.75 mg/dL (ref 0.40–1.20)
GFR: 79.04 mL/min (ref >60.00)
Glucose, Bld: 119 mg/dL — ABNORMAL HIGH (ref 70–99)
Potassium: 4 mEq/L (ref 3.5–5.1)
Sodium: 137 mEq/L (ref 135–145)

## 2018-06-09 LAB — URINALYSIS
Bilirubin Urine: NEGATIVE
Hgb urine dipstick: NEGATIVE
Ketones, ur: NEGATIVE
Leukocytes, UA: NEGATIVE
Nitrite: NEGATIVE
Specific Gravity, Urine: 1.01 (ref 1.000–1.030)
Total Protein, Urine: NEGATIVE
Urine Glucose: NEGATIVE
Urobilinogen, UA: 0.2 (ref 0.0–1.0)
pH: 7 (ref 5.0–8.0)

## 2018-06-09 LAB — LIPID PANEL
Cholesterol: 191 mg/dL (ref 0–200)
HDL: 51.4 mg/dL (ref >39.00)
NonHDL: 139.6
Total CHOL/HDL Ratio: 4
Triglycerides: 201 mg/dL — ABNORMAL HIGH (ref 0.0–149.0)
VLDL: 40.2 mg/dL — ABNORMAL HIGH (ref 0.0–40.0)

## 2018-06-09 LAB — LDL CHOLESTEROL, DIRECT: Direct LDL: 117 mg/dL

## 2018-06-09 LAB — TSH: TSH: 2.72 u[IU]/mL (ref 0.35–4.50)

## 2018-06-10 ENCOUNTER — Ambulatory Visit (INDEPENDENT_AMBULATORY_CARE_PROVIDER_SITE_OTHER): Payer: Medicare Other | Admitting: Family Medicine

## 2018-06-10 ENCOUNTER — Encounter: Payer: Self-pay | Admitting: Family Medicine

## 2018-06-10 VITALS — BP 114/68 | HR 87 | Temp 97.7°F

## 2018-06-10 DIAGNOSIS — H01001 Unspecified blepharitis right upper eyelid: Secondary | ICD-10-CM

## 2018-06-10 DIAGNOSIS — L309 Dermatitis, unspecified: Secondary | ICD-10-CM

## 2018-06-10 MED ORDER — DESONIDE 0.05 % EX CREA: TOPICAL_CREAM | Freq: Two times a day (BID) | CUTANEOUS | 0 refills | Status: DC

## 2018-06-10 MED ORDER — ERYTHROMYCIN 5 MG/GM OP OINT: 1.0000 "application " | TOPICAL_OINTMENT | Freq: Three times a day (TID) | OPHTHALMIC | 0 refills | Status: DC

## 2018-06-10 NOTE — Progress Notes (Signed)
Patient ID: [name], female    DOB: [birth year]  Age: 82 y.o. MRN: [id number]    Subjective:  Subjective  HPI [name] presents for rash on face --- she thought it might be shingles  She has been using a cream from a dermatologist on one spot but not all and it has helped.   Review of Systems  Constitutional: Negative for activity change, appetite change, fatigue and unexpected weight change.  Respiratory: Negative for cough and shortness of breath.   Cardiovascular: Negative for chest pain and palpitations.  Skin: Positive for rash.  Psychiatric/Behavioral: Negative for behavioral problems and dysphoric mood. The patient is not nervous/anxious.     History Past Medical History:  Diagnosis Date  . Allergic rhinitis   . Atrophic vaginitis   . Baker's cyst    Left-Dr. Aluisio  . Cataract    Dr. Katy Fitch  . Colon polyps   . Fibroid   . Gastritis    chronic  . GERD (gastroesophageal reflux disease)   . Heart murmur   . Hemorrhoids   . Hyperlipidemia   . IBS (irritable bowel syndrome)   . MVP (mitral valve prolapse)    Antibiotics required for dental procedures  . OA (osteoarthritis)   . Osteoporosis 05/2014   T score distal third radius -2.5  Spine, left and right hip all show statistically significant improvement.  . Overactive bladder   . Thyroid disease   . Thyroid nodule   . Torn meniscus    bilateral    She has a past surgical history that includes Cataract extraction, bilateral and Tonsillectomy.   Her family history includes Congestive Heart Failure in her mother; Heart attack in her brother; Heart disease in her mother; Hypertension in her mother; Osteoporosis in her mother; Pancreatic cancer in her father.She reports that she has quit smoking. She has never used smokeless tobacco. She reports that she drinks about 3.0 oz of alcohol per week. She reports that she does not use drugs.  Current Outpatient Medications on File Prior to Visit  Medication Sig Dispense  Refill  . albuterol (PROVENTIL HFA;VENTOLIN HFA) 108 (90 BASE) MCG/ACT inhaler Inhale 2 puffs into the lungs every 6 (six) hours as needed for wheezing. 1 Inhaler 3  . amoxicillin (AMOXIL) 500 MG capsule Reported on 06/11/2016    . budesonide-formoterol (SYMBICORT) 160-4.5 MCG/ACT inhaler Inhale 2 puffs 2 (two) times daily into the lungs. 1 Inhaler 3  . Calcium Carbonate-Vitamin D (CALCIUM-VITAMIN D) 500-200 MG-UNIT per tablet Take 3 tablets by mouth daily.     . Cholecalciferol 4000 units TABS Take 1 tablet by mouth daily.    [name] dexlansoprazole (DEXILANT) 60 MG capsule TAKE (1) CAPSULE DAILY. 30 capsule 11  . EPIPEN 2-PAK 0.3 MG/0.3ML SOAJ injection Inject 0.3 mg as directed as needed.     [name] ESTRACE VAGINAL 0.1 MG/GM vaginal cream PLACE 1 APPLICATORFUL VAGINALLY 3 TIMES A WEEK. 42.5 g 3  . fluticasone (FLONASE) 50 MCG/ACT nasal spray USE 2 SPRAYS IN EACH NOSTRIL ONCE DAILY 16 g 0  . ipratropium (ATROVENT) 0.06 % nasal spray Place 1 spray into the nose 3 (three) times daily as needed for rhinitis. 15 mL 0  . levothyroxine (SYNTHROID) 50 MCG tablet TAKE 1 TABLET ONCE DAILY BEFORE BREAKFAST. 30 tablet 11  . LORazepam (ATIVAN) 1 MG tablet TAKE 1 OR 2 TABLETS TWICE A DAY AS NEEDED FOR ANXIETY. 100 tablet 3  . mometasone (NASONEX) 50 MCG/ACT nasal spray Place 2 sprays into the nose daily.    [name]  montelukast (SINGULAIR) 10 MG tablet Take 10 mg by mouth at bedtime.     [name] nystatin-triamcinolone ointment (MYCOLOG) Apply 1 application topically 2 (two) times daily. 30 g 1  . Phosphatidylserine-DHA-EPA (VAYACOG) 100-19.5-6.5 MG CAPS Take 1 capsule by mouth daily. 90 capsule 3  . ranitidine (ZANTAC) 150 MG tablet     . SALINE NASAL SPRAY NA Place into the nose.    . simvastatin (ZOCOR) 40 MG tablet TAKE 1 TABLET ONCE DAILY. 90 tablet 1  . VESICARE 10 MG tablet Take 1 tablet by mouth every other day.     . fexofenadine (ALLEGRA) 180 MG tablet Take 180 mg by mouth.    . NONFORMULARY OR COMPOUNDED ITEM Estradiol  0.02% vaginal cream insert 1 applicator vaginal twice weekly (Patient not taking: Reported on 06/10/2018) 90 each 3   Current Facility-Administered Medications on File Prior to Visit  Medication Dose Route Frequency Provider Last Rate Last Dose  . triamcinolone acetonide (KENALOG) 10 MG/ML injection 10 mg  10 mg Other Once Regal, Norman S, [name]      . triamcinolone acetonide (KENALOG) 10 MG/ML injection 10 mg  10 mg Other Once [name], [name]         Objective:  Objective  Physical Exam  Constitutional: She is oriented to person, place, and time. She appears well-developed and well-nourished.  HENT:  Head: Normocephalic and atraumatic.    Eyes: Conjunctivae and EOM are normal.  Neck: Normal range of motion. Neck supple. No JVD present. Carotid bruit is not present. No thyromegaly present.  Cardiovascular: Normal rate, regular rhythm and normal heart sounds.  No murmur heard. Pulmonary/Chest: Effort normal and breath sounds normal. No respiratory distress. She has no wheezes. She has no rales. She exhibits no tenderness.  Musculoskeletal: She exhibits no edema.  Neurological: She is alert and oriented to person, place, and time.  Skin: Rash noted. There is erythema.  Psychiatric: She has a normal mood and affect.  Nursing note and vitals reviewed.  BP 114/68   Pulse 87   Temp 97.7 F (36.5 C) (Oral)   SpO2 98%  Wt Readings from Last 3 Encounters:  04/07/17 125 lb (56.7 kg)  04/01/17 122 lb (55.3 kg)  08/12/16 127 lb (57.6 kg)     Lab Results  Component Value Date   WBC 6.7 06/09/2018   HGB 13.9 06/09/2018   HCT 40.6 06/09/2018   PLT 281.0 06/09/2018   GLUCOSE 119 (H) 06/09/2018   CHOL 191 06/09/2018   TRIG 201.0 (H) 06/09/2018   HDL 51.40 06/09/2018   LDLDIRECT 117.0 06/09/2018   LDLCALC 105 (H) 02/07/2018   ALT 19 02/07/2018   AST 17 02/07/2018   NA 137 06/09/2018   K 4.0 06/09/2018   CL 101 06/09/2018   CREATININE 0.75 06/09/2018   BUN 18 06/09/2018   CO2  24 06/09/2018   TSH 2.72 06/09/2018   HGBA1C 6.0 07/08/2016    Dg Cervical Spine Complete  Result Date: 05/25/2018 CLINICAL DATA:  Pt c/o neck AND low back pain x 2 years. NO hx of injury. Hx of osteoporosis. EXAM: CERVICAL SPINE - COMPLETE 4+ VIEW COMPARISON:  None. FINDINGS: Negative for fracture. Straightening of the normal cervical lordosis. No prevertebral soft tissue swelling. 2 mm anterolisthesis C3-4. Marked narrowing of interspaces C3-T1 with anterior and posterior endplate spurring. Osseous foraminal encroachment right greater than left C3-4, C4-5. Multiple dental restorations. IMPRESSION: 1. Negative for fracture or other other acute bone abnormality. 2. Multilevel degenerative disc disease  C3-T1. 3. Foraminal osseous encroachment C3-5, right greater than left. Electronically Signed   By: Lucrezia Europe M.D.   On: 05/25/2018 08:44   Dg Lumbar Spine Complete  Result Date: 05/25/2018 CLINICAL DATA:  Pt c/o neck AND low back pain x 2 years. NO hx of injury. Hx of osteoporosis. EXAM: LUMBAR SPINE - COMPLETE 4+ VIEW COMPARISON:  CT 01/01/2015 FINDINGS: Negative for fracture. Advanced degenerative disc disease L1-S1. Grade 1 anterolisthesis L4-5 and L5-S1 as before. No definite pars defects are identified. There is marked facet DJD in the lower lumbar spine. Patchy aortic calcifications without suggestion of aneurysm. IMPRESSION: 1. Negative for fracture or other acute finding. 2. Multilevel degenerative disc disease L1-S1. 3. Stable grade 1 anterolisthesis L4-5 and L5-S1, likely related to advanced facet DJD at these levels. Electronically Signed   By: Lucrezia Europe M.D.   On: 05/25/2018 08:42     Assessment & Plan:  Plan  I have discontinued [name]'s diclofenac sodium. I am also having her start on desonide and erythromycin. Additionally, I am having her maintain her calcium-vitamin D, SALINE NASAL SPRAY NA, albuterol, ipratropium, EPIPEN 2-PAK, montelukast, amoxicillin, VESICARE, fluticasone,  ESTRACE VAGINAL, Cholecalciferol, Phosphatidylserine-DHA-EPA, fexofenadine, nystatin-triamcinolone ointment, dexlansoprazole, budesonide-formoterol, mometasone, simvastatin, levothyroxine, ranitidine, NONFORMULARY OR COMPOUNDED ITEM, and LORazepam. We will continue to administer triamcinolone acetonide and triamcinolone acetonide.  Meds ordered this encounter  Medications  . desonide (DESOWEN) 0.05 % cream    Sig: Apply topically 2 (two) times daily.    Dispense:  30 g    Refill:  0  . erythromycin ophthalmic ointment    Sig: Place 1 application into the right eye 3 (three) times daily.    Dispense:  3.5 g    Refill:  0    Problem List Items Addressed This Visit    None    Visit Diagnoses    Eczema, unspecified type    -  Primary   Relevant Medications   desonide (DESOWEN) 0.05 % cream   Blepharitis of right upper eyelid, unspecified type       Relevant Medications   erythromycin ophthalmic ointment      Follow-up: Return if symptoms worsen or fail to improve.  Ann Held, DO

## 2018-06-10 NOTE — Patient Instructions (Signed)
Atopic Dermatitis Atopic dermatitis is a skin disorder that causes inflammation of the skin. This is the most common type of eczema. Eczema is a group of skin conditions that cause the skin to be itchy, red, and swollen. This condition is generally worse during the cooler winter months and often improves during the warm summer months. Symptoms can vary from person to person. Atopic dermatitis usually starts showing signs in infancy and can last through adulthood. This condition cannot be passed from one person to another (non-contagious), but it is more common in families. Atopic dermatitis may not always be present. When it is present, it is called a flare-up. What are the causes? The exact cause of this condition is not known. Flare-ups of the condition may be triggered by:  Contact with something that you are sensitive or allergic to.  Stress.  Certain foods.  Extremely hot or cold weather.  Harsh chemicals and soaps.  Dry air.  Chlorine.  What increases the risk? This condition is more likely to develop in people who have a personal history or family history of eczema, allergies, asthma, or hay fever. What are the signs or symptoms? Symptoms of this condition include:  Dry, scaly skin.  Red, itchy rash.  Itchiness, which can be severe. This may occur before the skin rash. This can make sleeping difficult.  Skin thickening and cracking that can occur over time.  How is this diagnosed? This condition is diagnosed based on your symptoms, a medical history, and a physical exam. How is this treated? There is no cure for this condition, but symptoms can usually be controlled. Treatment focuses on:  Controlling the itchiness and scratching. You may be given medicines, such as antihistamines or steroid creams.  Limiting exposure to things that you are sensitive or allergic to (allergens).  Recognizing situations that cause stress and developing a plan to manage stress.  If  your atopic dermatitis does not get better with medicines, or if it is all over your body (widespread), a treatment using a specific type of light (phototherapy) may be used. Follow these instructions at home: Skin care  Keep your skin well-moisturized. Doing this seals in moisture and helps to prevent dryness. ? Use unscented lotions that have petroleum in them. ? Avoid lotions that contain alcohol or water. They can dry the skin.  Keep baths or showers short (less than 5 minutes) in warm water. Do not use hot water. ? Use mild, unscented cleansers for bathing. Avoid soap and bubble bath. ? Apply a moisturizer to your skin right after a bath or shower.  Do not apply anything to your skin without checking with your health care provider. General instructions  Dress in clothes made of cotton or cotton blends. Dress lightly because heat increases itchiness.  When washing your clothes, rinse your clothes twice so all of the soap is removed.  Avoid any triggers that can cause a flare-up.  Try to manage your stress.  Keep your fingernails cut short.  Avoid scratching. Scratching makes the rash and itchiness worse. It may also result in a skin infection (impetigo) due to a break in the skin caused by scratching.  Take or apply over-the-counter and prescription medicines only as told by your health care provider.  Keep all follow-up visits as told by your health care provider. This is important.  Do not be around people who have cold sores or fever blisters. If you get the infection, it may cause your atopic dermatitis to worsen. Contact   a health care provider if:  Your itchiness interferes with sleep.  Your rash gets worse or it is not better within one week of starting treatment.  You have a fever.  You have a rash flare-up after having contact with someone who has cold sores or fever blisters. Get help right away if:  You develop pus or soft yellow scabs in the rash  area. Summary  This condition causes a red rash and itchy, dry, scaly skin.  Treatment focuses on controlling the itchiness and scratching, limiting exposure to things that you are sensitive or allergic to (allergens), recognizing situations that cause stress, and developing a plan to manage stress.  Keep your skin well-moisturized.  Keep baths or showers shorter than 5 minutes and use warm water. Do not use hot water. This information is not intended to replace advice given to you by your health care provider. Make sure you discuss any questions you have with your health care provider. Document Released: 11/12/2000 Document Revised: 12/17/2016 Document Reviewed: 12/17/2016 Elsevier Interactive Patient Education  2018 [employer]  

## 2018-06-11 ENCOUNTER — Other Ambulatory Visit: Payer: Self-pay | Admitting: Internal Medicine

## 2018-06-11 MED ORDER — DICLOFENAC SODIUM 1 % TD GEL: 4.0000 g | Freq: Four times a day (QID) | TRANSDERMAL | 5 refills | Status: DC

## 2018-06-13 DIAGNOSIS — L309 Dermatitis, unspecified: Secondary | ICD-10-CM | POA: Diagnosis not present

## 2018-06-13 DIAGNOSIS — R202 Paresthesia of skin: Secondary | ICD-10-CM | POA: Diagnosis not present

## 2018-06-14 ENCOUNTER — Ambulatory Visit (INDEPENDENT_AMBULATORY_CARE_PROVIDER_SITE_OTHER): Payer: Medicare Other | Admitting: Internal Medicine

## 2018-06-14 ENCOUNTER — Encounter: Payer: Self-pay | Admitting: Internal Medicine

## 2018-06-14 DIAGNOSIS — L259 Unspecified contact dermatitis, unspecified cause: Secondary | ICD-10-CM | POA: Insufficient documentation

## 2018-06-14 DIAGNOSIS — E785 Hyperlipidemia, unspecified: Secondary | ICD-10-CM

## 2018-06-14 DIAGNOSIS — I10 Essential (primary) hypertension: Secondary | ICD-10-CM

## 2018-06-14 DIAGNOSIS — E034 Atrophy of thyroid (acquired): Secondary | ICD-10-CM | POA: Diagnosis not present

## 2018-06-14 DIAGNOSIS — J301 Allergic rhinitis due to pollen: Secondary | ICD-10-CM

## 2018-06-14 HISTORY — DX: Unspecified contact dermatitis, unspecified cause: L25.9

## 2018-06-14 MED ORDER — LORAZEPAM 1 MG PO TABS
ORAL_TABLET | ORAL | 3 refills | Status: DC
Start: 2018-06-14 — End: 2018-12-11

## 2018-06-14 MED ORDER — METHYLPREDNISOLONE ACETATE 80 MG/ML IJ SUSP
80.0000 mg | Freq: Once | INTRAMUSCULAR | Status: AC
  Administered 2018-06-14: 80 mg via INTRAMUSCULAR

## 2018-06-14 NOTE — Assessment & Plan Note (Signed)
BP Readings from Last 3 Encounters:  06/14/18 120/68  06/10/18 114/68  04/05/18 118/74

## 2018-06-14 NOTE — Assessment & Plan Note (Signed)
Flonase, Atrovent nasal, Singulair, Claritin

## 2018-06-14 NOTE — Assessment & Plan Note (Signed)
Chronic On Levothroid - 

## 2018-06-14 NOTE — Patient Instructions (Addendum)
Cortaid for eyelids ?nail polish dermatitis

## 2018-06-14 NOTE — Assessment & Plan Note (Signed)
Simvastatin 

## 2018-06-14 NOTE — Progress Notes (Addendum)
Subjective:  Patient ID: [name], female    DOB: 07-Sep-1936  Age: 82 y.o. MRN: [id number]  CC: No chief complaint on file.   HPI [name] presents for skin rash on face on and off - saw her dermatologist Dr Delman Cheadle yesterday: irritant dermatitis  Outpatient Medications Prior to Visit  Medication Sig Dispense Refill  . albuterol (PROVENTIL HFA;VENTOLIN HFA) 108 (90 BASE) MCG/ACT inhaler Inhale 2 puffs into the lungs every 6 (six) hours as needed for wheezing. 1 Inhaler 3  . amoxicillin (AMOXIL) 500 MG capsule Reported on 06/11/2016    . budesonide-formoterol (SYMBICORT) 160-4.5 MCG/ACT inhaler Inhale 2 puffs 2 (two) times daily into the lungs. 1 Inhaler 3  . Calcium Carbonate-Vitamin D (CALCIUM-VITAMIN D) 500-200 MG-UNIT per tablet Take 3 tablets by mouth daily.     . Cholecalciferol 4000 units TABS Take 1 tablet by mouth daily.    [name] desonide (DESOWEN) 0.05 % cream Apply topically 2 (two) times daily. 30 g 0  . dexlansoprazole (DEXILANT) 60 MG capsule TAKE (1) CAPSULE DAILY. 30 capsule 11  . diclofenac sodium (VOLTAREN) 1 % GEL Apply 4 g topically 4 (four) times daily. 100 g 5  . EPIPEN 2-PAK 0.3 MG/0.3ML SOAJ injection Inject 0.3 mg as directed as needed.     [name] erythromycin ophthalmic ointment Place 1 application into the right eye 3 (three) times daily. 3.5 g 0  . ESTRACE VAGINAL 0.1 MG/GM vaginal cream PLACE 1 APPLICATORFUL VAGINALLY 3 TIMES A WEEK. 42.5 g 3  . fexofenadine (ALLEGRA) 180 MG tablet Take 180 mg by mouth.    . fluticasone (FLONASE) 50 MCG/ACT nasal spray USE 2 SPRAYS IN EACH NOSTRIL ONCE DAILY 16 g 0  . ipratropium (ATROVENT) 0.06 % nasal spray Place 1 spray into the nose 3 (three) times daily as needed for rhinitis. 15 mL 0  . levothyroxine (SYNTHROID) 50 MCG tablet TAKE 1 TABLET ONCE DAILY BEFORE BREAKFAST. 30 tablet 11  . LORazepam (ATIVAN) 1 MG tablet TAKE 1 OR 2 TABLETS TWICE A DAY AS NEEDED FOR ANXIETY. 100 tablet 3  . mometasone (NASONEX) 50 MCG/ACT nasal spray  Place 2 sprays into the nose daily.    . montelukast (SINGULAIR) 10 MG tablet Take 10 mg by mouth at bedtime.     . NONFORMULARY OR COMPOUNDED ITEM Estradiol 0.02% vaginal cream insert 1 applicator vaginal twice weekly 90 each 3  . nystatin-triamcinolone ointment (MYCOLOG) Apply 1 application topically 2 (two) times daily. 30 g 1  . ranitidine (ZANTAC) 150 MG tablet     . SALINE NASAL SPRAY NA Place into the nose.    . simvastatin (ZOCOR) 40 MG tablet TAKE 1 TABLET ONCE DAILY. 90 tablet 1  . VESICARE 10 MG tablet Take 1 tablet by mouth every other day.      Facility-Administered Medications Prior to Visit  Medication Dose Route Frequency Provider Last Rate Last Dose  . triamcinolone acetonide (KENALOG) 10 MG/ML injection 10 mg  10 mg Other Once Regal, Norman S, [name]      . triamcinolone acetonide (KENALOG) 10 MG/ML injection 10 mg  10 mg Other Once Regal, Norman S, [name]        ROS: Review of Systems  Constitutional: Negative for activity change, appetite change, chills, fatigue and unexpected weight change.  HENT: Negative for congestion, mouth sores and sinus pressure.   Eyes: Positive for itching. Negative for visual disturbance.  Respiratory: Negative for cough and chest tightness.   Gastrointestinal: Negative for abdominal pain and  nausea.  Genitourinary: Negative for difficulty urinating, frequency and vaginal pain.  Musculoskeletal: Negative for back pain and gait problem.  Skin: Positive for rash. Negative for pallor.  Neurological: Negative for dizziness, tremors, weakness, numbness and headaches.  Psychiatric/Behavioral: Negative for confusion and sleep disturbance.    Objective:  BP 120/68 (BP Location: Left Arm, Patient Position: Sitting, Cuff Size: Normal)   Pulse 84   Temp 98.2 F (36.8 C) (Oral)   Ht 5' (1.524 m)   SpO2 97%   BMI 24.41 kg/m   BP Readings from Last 3 Encounters:  06/14/18 120/68  06/10/18 114/68  04/05/18 118/74    Wt Readings from Last 3  Encounters:  04/07/17 125 lb (56.7 kg)  04/01/17 122 lb (55.3 kg)  08/12/16 127 lb (57.6 kg)    Physical Exam  Constitutional: She appears well-developed. No distress.  HENT:  Head: Normocephalic.  Right Ear: External ear normal.  Left Ear: External ear normal.  Nose: Nose normal.  Mouth/Throat: Oropharynx is clear and moist.  Eyes: Pupils are equal, round, and reactive to light. Conjunctivae are normal. Right eye exhibits no discharge. Left eye exhibits no discharge.  Neck: Normal range of motion. Neck supple. No JVD present. No tracheal deviation present. No thyromegaly present.  Cardiovascular: Normal rate, regular rhythm and normal heart sounds.  Pulmonary/Chest: No stridor. No respiratory distress. She has no wheezes.  Abdominal: Soft. Bowel sounds are normal. She exhibits no distension and no mass. There is no tenderness. There is no rebound and no guarding.  Musculoskeletal: She exhibits no edema or tenderness.  Lymphadenopathy:    She has no cervical adenopathy.  Neurological: She displays normal reflexes. No cranial nerve deficit. She exhibits normal muscle tone. Coordination normal.  Skin: Rash noted. No erythema.  Psychiatric: She has a normal mood and affect. Her behavior is normal. Judgment and thought content normal.  eyelids w/erythema and scaling  Lab Results  Component Value Date   WBC 6.7 06/09/2018   HGB 13.9 06/09/2018   HCT 40.6 06/09/2018   PLT 281.0 06/09/2018   GLUCOSE 119 (H) 06/09/2018   CHOL 191 06/09/2018   TRIG 201.0 (H) 06/09/2018   HDL 51.40 06/09/2018   LDLDIRECT 117.0 06/09/2018   LDLCALC 105 (H) 02/07/2018   ALT 19 02/07/2018   AST 17 02/07/2018   NA 137 06/09/2018   K 4.0 06/09/2018   CL 101 06/09/2018   CREATININE 0.75 06/09/2018   BUN 18 06/09/2018   CO2 24 06/09/2018   TSH 2.72 06/09/2018   HGBA1C 6.0 07/08/2016    Dg Cervical Spine Complete  Result Date: 05/25/2018 CLINICAL DATA:  Pt c/o neck AND low back pain x 2 years. NO  hx of injury. Hx of osteoporosis. EXAM: CERVICAL SPINE - COMPLETE 4+ VIEW COMPARISON:  None. FINDINGS: Negative for fracture. Straightening of the normal cervical lordosis. No prevertebral soft tissue swelling. 2 mm anterolisthesis C3-4. Marked narrowing of interspaces C3-T1 with anterior and posterior endplate spurring. Osseous foraminal encroachment right greater than left C3-4, C4-5. Multiple dental restorations. IMPRESSION: 1. Negative for fracture or other other acute bone abnormality. 2. Multilevel degenerative disc disease C3-T1. 3. Foraminal osseous encroachment C3-5, right greater than left. Electronically Signed   By: Lucrezia Europe M.D.   On: 05/25/2018 08:44   Dg Lumbar Spine Complete  Result Date: 05/25/2018 CLINICAL DATA:  Pt c/o neck AND low back pain x 2 years. NO hx of injury. Hx of osteoporosis. EXAM: LUMBAR SPINE - COMPLETE 4+ VIEW COMPARISON:  CT  01/01/2015 FINDINGS: Negative for fracture. Advanced degenerative disc disease L1-S1. Grade 1 anterolisthesis L4-5 and L5-S1 as before. No definite pars defects are identified. There is marked facet DJD in the lower lumbar spine. Patchy aortic calcifications without suggestion of aneurysm. IMPRESSION: 1. Negative for fracture or other acute finding. 2. Multilevel degenerative disc disease L1-S1. 3. Stable grade 1 anterolisthesis L4-5 and L5-S1, likely related to advanced facet DJD at these levels. Electronically Signed   By: Lucrezia Europe M.D.   On: 05/25/2018 08:42    Assessment & Plan:   There are no diagnoses linked to this encounter.   No orders of the defined types were placed in this encounter.    Follow-up: No follow-ups on file.  Walker Kehr, MD

## 2018-06-14 NOTE — Assessment & Plan Note (Addendum)
  Eczema vs other - 7/19 Cortaid prn Depo-medrol IM 80 mg Remove nail polish

## 2018-06-20 DIAGNOSIS — H1013 Acute atopic conjunctivitis, bilateral: Secondary | ICD-10-CM | POA: Diagnosis not present

## 2018-06-28 ENCOUNTER — Encounter: Payer: Self-pay | Admitting: Internal Medicine

## 2018-06-28 ENCOUNTER — Other Ambulatory Visit: Payer: Self-pay

## 2018-06-28 DIAGNOSIS — R300 Dysuria: Secondary | ICD-10-CM

## 2018-06-29 ENCOUNTER — Encounter: Payer: Self-pay | Admitting: Internal Medicine

## 2018-06-29 ENCOUNTER — Other Ambulatory Visit (INDEPENDENT_AMBULATORY_CARE_PROVIDER_SITE_OTHER): Payer: Medicare Other

## 2018-06-29 DIAGNOSIS — R300 Dysuria: Secondary | ICD-10-CM | POA: Diagnosis not present

## 2018-06-29 LAB — URINALYSIS
Bilirubin Urine: NEGATIVE
Hgb urine dipstick: NEGATIVE
Ketones, ur: NEGATIVE
Leukocytes, UA: NEGATIVE
Nitrite: NEGATIVE
Specific Gravity, Urine: 1.01 (ref 1.000–1.030)
Total Protein, Urine: NEGATIVE
Urine Glucose: NEGATIVE
Urobilinogen, UA: 0.2 (ref 0.0–1.0)
pH: 7 (ref 5.0–8.0)

## 2018-06-29 NOTE — Telephone Encounter (Unsigned)
Copied from [place] [id number]. Topic: Inquiry >> Jun 29, 2018  7:43 AM Pricilla Handler wrote: Reason for CRM: Patient wants to know if she can have a UTI Urine Test in the office without scheduling an appt. I advised that she may need to schedule an appt for evaluation. Patient only wants cto come in and give a urine sample. Patient would like a call back from Dr. Lucy Antigua CMA this morning. Patient is going out of town for her 82th Birthday.       Thank You!!!  >> Jun 29, 2018  7:55 AM Morphies, Isidoro Donning wrote: Please advise in Betty's absence.

## 2018-07-04 ENCOUNTER — Other Ambulatory Visit: Payer: Self-pay | Admitting: Gynecology

## 2018-07-04 ENCOUNTER — Ambulatory Visit (INDEPENDENT_AMBULATORY_CARE_PROVIDER_SITE_OTHER): Payer: Medicare Other

## 2018-07-04 DIAGNOSIS — M810 Age-related osteoporosis without current pathological fracture: Secondary | ICD-10-CM | POA: Diagnosis not present

## 2018-07-06 ENCOUNTER — Encounter: Payer: Self-pay | Admitting: Gynecology

## 2018-07-11 DIAGNOSIS — L309 Dermatitis, unspecified: Secondary | ICD-10-CM | POA: Diagnosis not present

## 2018-07-11 DIAGNOSIS — L821 Other seborrheic keratosis: Secondary | ICD-10-CM | POA: Diagnosis not present

## 2018-07-11 DIAGNOSIS — R202 Paresthesia of skin: Secondary | ICD-10-CM | POA: Diagnosis not present

## 2018-07-11 DIAGNOSIS — Z85828 Personal history of other malignant neoplasm of skin: Secondary | ICD-10-CM | POA: Diagnosis not present

## 2018-07-11 DIAGNOSIS — D225 Melanocytic nevi of trunk: Secondary | ICD-10-CM | POA: Diagnosis not present

## 2018-07-18 ENCOUNTER — Other Ambulatory Visit: Payer: Self-pay | Admitting: Internal Medicine

## 2018-07-18 DIAGNOSIS — Z1231 Encounter for screening mammogram for malignant neoplasm of breast: Secondary | ICD-10-CM

## 2018-07-19 ENCOUNTER — Ambulatory Visit (INDEPENDENT_AMBULATORY_CARE_PROVIDER_SITE_OTHER): Payer: Medicare Other | Admitting: Psychology

## 2018-07-19 DIAGNOSIS — F4320 Adjustment disorder, unspecified: Secondary | ICD-10-CM | POA: Diagnosis not present

## 2018-08-03 ENCOUNTER — Other Ambulatory Visit: Payer: Self-pay | Admitting: Gynecology

## 2018-08-03 ENCOUNTER — Telehealth: Payer: Self-pay | Admitting: *Deleted

## 2018-08-03 NOTE — Telephone Encounter (Addendum)
Deductible Individual: $185 ($185 met)  OOP MAX Value (Amount Met)  Annual exam 04/05/18 TF  Calcium  9.3           Date 06/09/18  Upcoming dental procedures  NO  Prior Authorization needed NO  Pt estimated Cost $0  Appt 09/05/18 @ 11:00     Coverage Details:: This is a Medicare Supplement Plan J and it covers the Medicare Part B deductible, co-insurance and 100% of the excess charges.

## 2018-08-21 ENCOUNTER — Ambulatory Visit: Payer: Self-pay | Admitting: *Deleted

## 2018-08-21 NOTE — Telephone Encounter (Signed)
Pt called complaining of a dull headache that started on 08/21/18; she says that she took tylenol and her headache got better; this morning she woke  156/83 left arm at 0810; 150/79 at 0812; on her right arm it was 140/90 at 0822 and 149/79; the pt says that she has used the neti pt due to sinus congestion; recommendations made per nurse triage protocol; the pt would like to be seen in the office today or tomorrow (by Dr Alain Marion only) because she is going out of town; pt offered and accepted appointment with Dr Alain Marion, [facility], 08/22/18 at 1120; she requests that he be notified so that she can be seen today; her best contact number is [phone]; will route to office for notification; spoke with Sam.    Reason for Disposition . [3] Systolic BP  >= 419 OR Diastolic >= 80 AND [3] taking BP medications  Answer Assessment - Initial Assessment Questions 1. BLOOD PRESSURE: "What is the blood pressure?" "Did you take at least two measurements 5 minutes apart?"     156/83 and 150 79 no 2. ONSET: "When did you take your blood pressure?"     0810 0812 3. HOW: "How did you obtain the blood pressure?" (e.g., visiting nurse, automatic home BP monitor)     Automatic home cuff 4. HISTORY: "Do you have a history of high blood pressure?"     yes 5. MEDICATIONS: "Are you taking any medications for blood pressure?" "Have you missed any doses recently?"     no 6. OTHER SYMPTOMS: "Do you have any symptoms?" (e.g., headache, chest pain, blurred vision, difficulty breathing, weakness)     headache 7. PREGNANCY: "Is there any chance you are pregnant?" "When was your last menstrual period?"     no  Protocols used: HIGH BLOOD PRESSURE-A-AH

## 2018-08-21 NOTE — Telephone Encounter (Signed)
Cannot see pt today, pt has appt tomorrow

## 2018-08-22 ENCOUNTER — Ambulatory Visit (INDEPENDENT_AMBULATORY_CARE_PROVIDER_SITE_OTHER): Payer: Medicare Other | Admitting: Internal Medicine

## 2018-08-22 ENCOUNTER — Encounter: Payer: Self-pay | Admitting: Internal Medicine

## 2018-08-22 ENCOUNTER — Ambulatory Visit
Admission: RE | Admit: 2018-08-22 | Discharge: 2018-08-22 | Disposition: A | Discharge status: Home / Self Care | Payer: Medicare Other | Source: Ambulatory Visit | Attending: Internal Medicine | Admitting: Internal Medicine

## 2018-08-22 ENCOUNTER — Ambulatory Visit: Payer: Self-pay

## 2018-08-22 DIAGNOSIS — I10 Essential (primary) hypertension: Secondary | ICD-10-CM

## 2018-08-22 DIAGNOSIS — F411 Generalized anxiety disorder: Secondary | ICD-10-CM | POA: Insufficient documentation

## 2018-08-22 DIAGNOSIS — Z1231 Encounter for screening mammogram for malignant neoplasm of breast: Secondary | ICD-10-CM

## 2018-08-22 DIAGNOSIS — M545 Low back pain: Secondary | ICD-10-CM | POA: Diagnosis not present

## 2018-08-22 DIAGNOSIS — J320 Chronic maxillary sinusitis: Secondary | ICD-10-CM

## 2018-08-22 DIAGNOSIS — J301 Allergic rhinitis due to pollen: Secondary | ICD-10-CM | POA: Diagnosis not present

## 2018-08-22 DIAGNOSIS — G8929 Other chronic pain: Secondary | ICD-10-CM | POA: Diagnosis not present

## 2018-08-22 DIAGNOSIS — F419 Anxiety disorder, unspecified: Secondary | ICD-10-CM | POA: Insufficient documentation

## 2018-08-22 HISTORY — DX: Generalized anxiety disorder: F41.1

## 2018-08-22 MED ORDER — ESCITALOPRAM OXALATE 5 MG PO TABS: 5.0000 mg | ORAL_TABLET | Freq: Every day | ORAL | 5 refills | Status: DC

## 2018-08-22 MED ORDER — METHYLPREDNISOLONE ACETATE 80 MG/ML IJ SUSP
80.0000 mg | Freq: Once | INTRAMUSCULAR | Status: AC
  Administered 2018-08-22: 80 mg via INTRAMUSCULAR

## 2018-08-22 NOTE — Progress Notes (Signed)
Subjective:  Patient ID: [name], female    DOB: 1936/08/29  Age: 82 y.o. MRN: [id number]  CC: No chief complaint on file.   HPI [name] presents for elevated BP. Stress w/Irv - c/o being depressed Gwendolyn saw Dr Cheryln Manly C/o back pain - worse, seeing a chiropractor   Outpatient Medications Prior to Visit  Medication Sig Dispense Refill  . albuterol (PROVENTIL HFA;VENTOLIN HFA) 108 (90 BASE) MCG/ACT inhaler Inhale 2 puffs into the lungs every 6 (six) hours as needed for wheezing. 1 Inhaler 3  . amoxicillin (AMOXIL) 500 MG capsule Reported on 06/11/2016    . azelastine (ASTELIN) 0.1 % nasal spray Place into both nostrils at bedtime. Use in each nostril as directed    . budesonide-formoterol (SYMBICORT) 160-4.5 MCG/ACT inhaler Inhale 2 puffs 2 (two) times daily into the lungs. 1 Inhaler 3  . Calcium Carbonate-Vitamin D (CALCIUM-VITAMIN D) 500-200 MG-UNIT per tablet Take 3 tablets by mouth daily.     . Cholecalciferol 4000 units TABS Take 1 tablet by mouth daily.    [name] desonide (DESOWEN) 0.05 % cream Apply topically 2 (two) times daily. 30 g 0  . dexlansoprazole (DEXILANT) 60 MG capsule TAKE (1) CAPSULE DAILY. 30 capsule 11  . diclofenac sodium (VOLTAREN) 1 % GEL Apply 4 g topically 4 (four) times daily. 100 g 5  . EPIPEN 2-PAK 0.3 MG/0.3ML SOAJ injection Inject 0.3 mg as directed as needed.     [name] erythromycin ophthalmic ointment Place 1 application into the right eye 3 (three) times daily. 3.5 g 0  . ESTRACE VAGINAL 0.1 MG/GM vaginal cream PLACE 1 APPLICATORFUL VAGINALLY 3 TIMES A WEEK. 42.5 g 3  . fexofenadine (ALLEGRA) 180 MG tablet Take 180 mg by mouth.    . fluticasone (FLONASE) 50 MCG/ACT nasal spray USE 2 SPRAYS IN EACH NOSTRIL ONCE DAILY 16 g 0  . ipratropium (ATROVENT) 0.06 % nasal spray Place 1 spray into the nose 3 (three) times daily as needed for rhinitis. 15 mL 0  . levothyroxine (SYNTHROID) 50 MCG tablet TAKE 1 TABLET ONCE DAILY BEFORE BREAKFAST. 30 tablet 11  .  LORazepam (ATIVAN) 1 MG tablet TAKE 1 OR 2 TABLETS TWICE A DAY AS NEEDED FOR ANXIETY. 100 tablet 3  . mometasone (NASONEX) 50 MCG/ACT nasal spray Place 2 sprays into the nose daily.    . montelukast (SINGULAIR) 10 MG tablet Take 10 mg by mouth at bedtime.     . NONFORMULARY OR COMPOUNDED ITEM Estradiol 0.02% vaginal cream insert 1 applicator vaginal twice weekly 90 each 3  . nystatin-triamcinolone ointment (MYCOLOG) Apply 1 application topically 2 (two) times daily. 30 g 1  . ranitidine (ZANTAC) 150 MG tablet     . SALINE NASAL SPRAY NA Place into the nose.    . simvastatin (ZOCOR) 40 MG tablet TAKE 1 TABLET ONCE DAILY. 90 tablet 1  . triamcinolone ointment (KENALOG) 0.1 % APPLY TO AFFECTED AREA TWICE A DAY. 30 g 2  . VESICARE 10 MG tablet Take 1 tablet by mouth every other day.      Facility-Administered Medications Prior to Visit  Medication Dose Route Frequency Provider Last Rate Last Dose  . triamcinolone acetonide (KENALOG) 10 MG/ML injection 10 mg  10 mg Other Once Regal, Norman S, [name]      . triamcinolone acetonide (KENALOG) 10 MG/ML injection 10 mg  10 mg Other Once Regal, Norman S, [name]        ROS: Review of Systems  Constitutional: Negative for activity change,  appetite change, chills, fatigue and unexpected weight change.  HENT: Negative for congestion, mouth sores and sinus pressure.   Eyes: Negative for visual disturbance.  Respiratory: Negative for cough and chest tightness.   Gastrointestinal: Negative for abdominal pain and nausea.  Genitourinary: Negative for difficulty urinating, frequency and vaginal pain.  Musculoskeletal: Negative for back pain and gait problem.  Skin: Negative for pallor and rash.  Neurological: Negative for dizziness, tremors, weakness, numbness and headaches.  Psychiatric/Behavioral: Negative for confusion, sleep disturbance and suicidal ideas.    Objective:  BP 130/68 (BP Location: Left Arm, Patient Position: Sitting, Cuff Size: Normal)    Pulse 82   Temp 98.2 F (36.8 C) (Oral)   Ht 5' (1.524 m)   SpO2 97%   BMI 24.41 kg/m   BP Readings from Last 3 Encounters:  08/22/18 130/68  06/14/18 120/68  06/10/18 114/68    Wt Readings from Last 3 Encounters:  04/07/17 125 lb (56.7 kg)  04/01/17 122 lb (55.3 kg)  08/12/16 127 lb (57.6 kg)    Physical Exam  Constitutional: She appears well-developed. No distress.  HENT:  Head: Normocephalic.  Right Ear: External ear normal.  Left Ear: External ear normal.  Nose: Nose normal.  Mouth/Throat: Oropharynx is clear and moist.  Eyes: Pupils are equal, round, and reactive to light. Conjunctivae are normal. Right eye exhibits no discharge. Left eye exhibits no discharge.  Neck: Normal range of motion. Neck supple. No JVD present. No tracheal deviation present. No thyromegaly present.  Cardiovascular: Normal rate, regular rhythm and normal heart sounds.  Pulmonary/Chest: No stridor. No respiratory distress. She has no wheezes.  Abdominal: Soft. Bowel sounds are normal. She exhibits no distension and no mass. There is no tenderness. There is no rebound and no guarding.  Musculoskeletal: She exhibits no edema or tenderness.  Lymphadenopathy:    She has no cervical adenopathy.  Neurological: She displays normal reflexes. No cranial nerve deficit. She exhibits normal muscle tone. Coordination normal.  Skin: No rash noted. No erythema.  Psychiatric: She has a normal mood and affect. Her behavior is normal. Judgment and thought content normal.  tearful  Lab Results  Component Value Date   WBC 6.7 06/09/2018   HGB 13.9 06/09/2018   HCT 40.6 06/09/2018   PLT 281.0 06/09/2018   GLUCOSE 119 (H) 06/09/2018   CHOL 191 06/09/2018   TRIG 201.0 (H) 06/09/2018   HDL 51.40 06/09/2018   LDLDIRECT 117.0 06/09/2018   LDLCALC 105 (H) 02/07/2018   ALT 19 02/07/2018   AST 17 02/07/2018   NA 137 06/09/2018   K 4.0 06/09/2018   CL 101 06/09/2018   CREATININE 0.75 06/09/2018   BUN 18  06/09/2018   CO2 24 06/09/2018   TSH 2.72 06/09/2018   HGBA1C 6.0 07/08/2016    No results found.  Assessment & Plan:   There are no diagnoses linked to this encounter.   No orders of the defined types were placed in this encounter.    Follow-up: No follow-ups on file.  Walker Kehr, MD

## 2018-08-22 NOTE — Assessment & Plan Note (Signed)
Depo-medrol IM Z pac if worse

## 2018-08-22 NOTE — Assessment & Plan Note (Signed)
Discussed antidepressants Lexapro 5 mg at hs

## 2018-08-22 NOTE — Assessment & Plan Note (Signed)
Cont to watch BP

## 2018-08-28 NOTE — Assessment & Plan Note (Signed)
Worse Depo-medrol 80 mg IM 

## 2018-08-28 NOTE — Assessment & Plan Note (Signed)
MSK Will watch

## 2018-08-30 DIAGNOSIS — J3089 Other allergic rhinitis: Secondary | ICD-10-CM | POA: Diagnosis not present

## 2018-08-30 DIAGNOSIS — T63441A Toxic effect of venom of bees, accidental (unintentional), initial encounter: Secondary | ICD-10-CM | POA: Diagnosis not present

## 2018-08-30 DIAGNOSIS — J4520 Mild intermittent asthma, uncomplicated: Secondary | ICD-10-CM | POA: Diagnosis not present

## 2018-08-30 DIAGNOSIS — J301 Allergic rhinitis due to pollen: Secondary | ICD-10-CM | POA: Diagnosis not present

## 2018-09-05 ENCOUNTER — Ambulatory Visit: Payer: Medicare Other

## 2018-09-08 ENCOUNTER — Telehealth: Payer: Self-pay | Admitting: Internal Medicine

## 2018-09-08 ENCOUNTER — Ambulatory Visit: Payer: Medicare Other

## 2018-09-08 NOTE — Telephone Encounter (Signed)
Patient states she has been having bad stomach pain and had diarrhea this morning. Patient is requesting a call from the nurse for advice.

## 2018-09-08 NOTE — Telephone Encounter (Signed)
Pt states she had lots of abdominal cramping and discomfort last night along with diarrhea. She took some pepto bismol and now she feels better, she only had one diarrhea stool. Offered pt an appt with PA but pt states she feels better now.

## 2018-09-11 ENCOUNTER — Ambulatory Visit (INDEPENDENT_AMBULATORY_CARE_PROVIDER_SITE_OTHER): Payer: Medicare Other | Admitting: Gynecology

## 2018-09-11 DIAGNOSIS — M810 Age-related osteoporosis without current pathological fracture: Secondary | ICD-10-CM | POA: Diagnosis not present

## 2018-09-11 MED ORDER — DENOSUMAB 60 MG/ML ~~LOC~~ SOSY
60.0000 mg | PREFILLED_SYRINGE | Freq: Once | SUBCUTANEOUS | Status: AC
  Administered 2018-09-11: 60 mg via SUBCUTANEOUS

## 2018-09-12 ENCOUNTER — Other Ambulatory Visit: Payer: Self-pay | Admitting: Internal Medicine

## 2018-09-13 NOTE — Telephone Encounter (Signed)
PROLIA GIVEN 09/11/18 NEXT INJECTION    03/14/2019

## 2018-09-20 ENCOUNTER — Telehealth: Payer: Self-pay | Admitting: Internal Medicine

## 2018-09-20 ENCOUNTER — Other Ambulatory Visit (INDEPENDENT_AMBULATORY_CARE_PROVIDER_SITE_OTHER): Payer: Medicare Other

## 2018-09-20 ENCOUNTER — Ambulatory Visit (INDEPENDENT_AMBULATORY_CARE_PROVIDER_SITE_OTHER): Payer: Medicare Other | Admitting: Internal Medicine

## 2018-09-20 ENCOUNTER — Encounter: Payer: Self-pay | Admitting: Internal Medicine

## 2018-09-20 VITALS — BP 124/66 | HR 82 | Temp 98.2°F | Ht 60.0 in

## 2018-09-20 DIAGNOSIS — K219 Gastro-esophageal reflux disease without esophagitis: Secondary | ICD-10-CM

## 2018-09-20 DIAGNOSIS — R350 Frequency of micturition: Secondary | ICD-10-CM | POA: Diagnosis not present

## 2018-09-20 DIAGNOSIS — I10 Essential (primary) hypertension: Secondary | ICD-10-CM

## 2018-09-20 DIAGNOSIS — E785 Hyperlipidemia, unspecified: Secondary | ICD-10-CM

## 2018-09-20 DIAGNOSIS — Z23 Encounter for immunization: Secondary | ICD-10-CM

## 2018-09-20 DIAGNOSIS — K588 Other irritable bowel syndrome: Secondary | ICD-10-CM | POA: Diagnosis not present

## 2018-09-20 DIAGNOSIS — E739 Lactose intolerance, unspecified: Secondary | ICD-10-CM | POA: Diagnosis not present

## 2018-09-20 DIAGNOSIS — F419 Anxiety disorder, unspecified: Secondary | ICD-10-CM | POA: Diagnosis not present

## 2018-09-20 LAB — URINALYSIS, ROUTINE W REFLEX MICROSCOPIC
Bilirubin Urine: NEGATIVE
Hgb urine dipstick: NEGATIVE
Ketones, ur: NEGATIVE
Leukocytes, UA: NEGATIVE
Nitrite: NEGATIVE
Specific Gravity, Urine: 1.015 (ref 1.000–1.030)
Total Protein, Urine: NEGATIVE
Urine Glucose: NEGATIVE
Urobilinogen, UA: 0.2 (ref 0.0–1.0)
pH: 6 (ref 5.0–8.0)

## 2018-09-20 MED ORDER — LEVOTHYROXINE SODIUM 50 MCG PO TABS: ORAL_TABLET | ORAL | 11 refills | Status: DC

## 2018-09-20 NOTE — Assessment & Plan Note (Signed)
better on gluten free diet

## 2018-09-20 NOTE — Assessment & Plan Note (Signed)
Lexapro 5 mg at hs - has a Rx - has not started it

## 2018-09-20 NOTE — Telephone Encounter (Signed)
LM notifying pt UA was ordered

## 2018-09-20 NOTE — Assessment & Plan Note (Signed)
BP Readings from Last 3 Encounters:  09/20/18 124/66  08/22/18 130/68  06/14/18 120/68

## 2018-09-20 NOTE — Telephone Encounter (Signed)
Copied from [place] [id number]. Topic: General - Other >> Sep 20, 2018  8:44 AM Nils Flack wrote: Reason for CRM: pt is having increased frequency with urination.  She would like to have come in early and leave a urine sample.  She has appt scheduled for 340 today, but wants to come in sooner to just leave sample.  Cb is [phone]

## 2018-09-20 NOTE — Assessment & Plan Note (Signed)
Dexilant  Potential benefits of a long term PPI  use as well as potential risks  and complications were explained to the patient and were aknowledged. GERD wedge

## 2018-09-20 NOTE — Progress Notes (Signed)
Subjective:  Patient ID: [name], female    DOB: 07-15-1936  Age: 82 y.o. MRN: [id number]  CC: No chief complaint on file.   HPI [name] presents for OAB, hypothyroidism, anxiety f/u  Outpatient Medications Prior to Visit  Medication Sig Dispense Refill  . albuterol (PROVENTIL HFA;VENTOLIN HFA) 108 (90 BASE) MCG/ACT inhaler Inhale 2 puffs into the lungs every 6 (six) hours as needed for wheezing. 1 Inhaler 3  . amoxicillin (AMOXIL) 500 MG capsule Reported on 06/11/2016    . azelastine (ASTELIN) 0.1 % nasal spray Place into both nostrils at bedtime. Use in each nostril as directed    . budesonide-formoterol (SYMBICORT) 160-4.5 MCG/ACT inhaler Inhale 2 puffs 2 (two) times daily into the lungs. 1 Inhaler 3  . Calcium Carbonate-Vitamin D (CALCIUM-VITAMIN D) 500-200 MG-UNIT per tablet Take 3 tablets by mouth daily.     . Cholecalciferol 4000 units TABS Take 1 tablet by mouth daily.    [name] desonide (DESOWEN) 0.05 % cream Apply topically 2 (two) times daily. 30 g 0  . dexlansoprazole (DEXILANT) 60 MG capsule TAKE (1) CAPSULE DAILY. 30 capsule 11  . diclofenac sodium (VOLTAREN) 1 % GEL Apply 4 g topically 4 (four) times daily. 100 g 5  . EPIPEN 2-PAK 0.3 MG/0.3ML SOAJ injection Inject 0.3 mg as directed as needed.     [name] erythromycin ophthalmic ointment Place 1 application into the right eye 3 (three) times daily. 3.5 g 0  . ESTRACE VAGINAL 0.1 MG/GM vaginal cream PLACE 1 APPLICATORFUL VAGINALLY 3 TIMES A WEEK. 42.5 g 3  . ipratropium (ATROVENT) 0.06 % nasal spray Place 1 spray into the nose 3 (three) times daily as needed for rhinitis. 15 mL 0  . levothyroxine (SYNTHROID) 50 MCG tablet TAKE 1 TABLET ONCE DAILY BEFORE BREAKFAST. 30 tablet 11  . LORazepam (ATIVAN) 1 MG tablet TAKE 1 OR 2 TABLETS TWICE A DAY AS NEEDED FOR ANXIETY. 100 tablet 3  . mometasone (NASONEX) 50 MCG/ACT nasal spray Place 2 sprays into the nose daily.    . montelukast (SINGULAIR) 10 MG tablet Take 10 mg by mouth at  bedtime.     . NONFORMULARY OR COMPOUNDED ITEM Estradiol 0.02% vaginal cream insert 1 applicator vaginal twice weekly 90 each 3  . nystatin-triamcinolone ointment (MYCOLOG) Apply 1 application topically 2 (two) times daily. 30 g 1  . SALINE NASAL SPRAY NA Place into the nose.    . simvastatin (ZOCOR) 40 MG tablet TAKE 1 TABLET ONCE DAILY. 90 tablet 0  . VESICARE 10 MG tablet Take 1 tablet by mouth every other day.     . escitalopram (LEXAPRO) 5 MG tablet Take 1 tablet (5 mg total) by mouth daily. 30 tablet 5  . fexofenadine (ALLEGRA) 180 MG tablet Take 180 mg by mouth.    . fluticasone (FLONASE) 50 MCG/ACT nasal spray USE 2 SPRAYS IN EACH NOSTRIL ONCE DAILY 16 g 0  . ranitidine (ZANTAC) 150 MG tablet     . triamcinolone ointment (KENALOG) 0.1 % APPLY TO AFFECTED AREA TWICE A DAY. 30 g 2   Facility-Administered Medications Prior to Visit  Medication Dose Route Frequency Provider Last Rate Last Dose  . triamcinolone acetonide (KENALOG) 10 MG/ML injection 10 mg  10 mg Other Once Regal, Norman S, [name]      . triamcinolone acetonide (KENALOG) 10 MG/ML injection 10 mg  10 mg Other Once [name], [name]        ROS: Review of Systems  Constitutional: Positive for  fatigue. Negative for activity change, appetite change, chills and unexpected weight change.  HENT: Negative for congestion, mouth sores and sinus pressure.   Eyes: Negative for visual disturbance.  Respiratory: Negative for cough and chest tightness.   Gastrointestinal: Positive for abdominal distention. Negative for abdominal pain and nausea.  Genitourinary: Positive for urgency. Negative for difficulty urinating, frequency and vaginal pain.  Musculoskeletal: Negative for back pain and gait problem.  Skin: Negative for pallor and rash.  Neurological: Negative for dizziness, tremors, weakness, numbness and headaches.  Psychiatric/Behavioral: Negative for confusion and sleep disturbance.    Objective:  BP 124/66 (BP Location:  Left Arm, Patient Position: Sitting, Cuff Size: Normal)   Pulse 82   Temp 98.2 F (36.8 C) (Oral)   Ht 5' (1.524 m)   SpO2 95%   BMI 24.41 kg/m   BP Readings from Last 3 Encounters:  09/20/18 124/66  08/22/18 130/68  06/14/18 120/68    Wt Readings from Last 3 Encounters:  04/07/17 125 lb (56.7 kg)  04/01/17 122 lb (55.3 kg)  08/12/16 127 lb (57.6 kg)    Physical Exam  Constitutional: She appears well-developed. No distress.  HENT:  Head: Normocephalic.  Right Ear: External ear normal.  Left Ear: External ear normal.  Nose: Nose normal.  Mouth/Throat: Oropharynx is clear and moist.  Eyes: Pupils are equal, round, and reactive to light. Conjunctivae are normal. Right eye exhibits no discharge. Left eye exhibits no discharge.  Neck: Normal range of motion. Neck supple. No JVD present. No tracheal deviation present. No thyromegaly present.  Cardiovascular: Normal rate, regular rhythm and normal heart sounds.  Pulmonary/Chest: No stridor. No respiratory distress. She has no wheezes.  Abdominal: Soft. Bowel sounds are normal. She exhibits no distension and no mass. There is no tenderness. There is no rebound and no guarding.  Musculoskeletal: She exhibits no edema or tenderness.  Lymphadenopathy:    She has no cervical adenopathy.  Neurological: She displays normal reflexes. No cranial nerve deficit. She exhibits normal muscle tone. Coordination normal.  Skin: No rash noted. No erythema.  Psychiatric: She has a normal mood and affect. Her behavior is normal. Judgment and thought content normal.    Lab Results  Component Value Date   WBC 6.7 06/09/2018   HGB 13.9 06/09/2018   HCT 40.6 06/09/2018   PLT 281.0 06/09/2018   GLUCOSE 119 (H) 06/09/2018   CHOL 191 06/09/2018   TRIG 201.0 (H) 06/09/2018   HDL 51.40 06/09/2018   LDLDIRECT 117.0 06/09/2018   LDLCALC 105 (H) 02/07/2018   ALT 19 02/07/2018   AST 17 02/07/2018   NA 137 06/09/2018   K 4.0 06/09/2018   CL 101  06/09/2018   CREATININE 0.75 06/09/2018   BUN 18 06/09/2018   CO2 24 06/09/2018   TSH 2.72 06/09/2018   HGBA1C 6.0 07/08/2016    Mm 3d Screen Breast Bilateral  Result Date: 08/22/2018 CLINICAL DATA:  Screening. EXAM: DIGITAL SCREENING BILATERAL MAMMOGRAM WITH TOMO AND CAD COMPARISON:  Previous exam(s). ACR Breast Density Category b: There are scattered areas of fibroglandular density. FINDINGS: There are no findings suspicious for malignancy. Images were processed with CAD. IMPRESSION: No mammographic evidence of malignancy. A result letter of this screening mammogram will be mailed directly to the patient. RECOMMENDATION: Screening mammogram in one year. (Code:SM-B-01Y) BI-RADS CATEGORY  1: Negative. Electronically Signed   By: Abelardo Diesel M.D.   On: 08/22/2018 13:00    Assessment & Plan:   There are no diagnoses linked to  this encounter.   No orders of the defined types were placed in this encounter.    Follow-up: No follow-ups on file.  Walker Kehr, MD

## 2018-09-21 LAB — URINE CULTURE
MICRO NUMBER:: 91274988
Result:: NO GROWTH
SPECIMEN QUALITY:: ADEQUATE

## 2018-09-29 ENCOUNTER — Telehealth: Payer: Self-pay

## 2018-09-29 NOTE — Telephone Encounter (Signed)
PA approved 06/29/18-09/27/19

## 2018-10-04 ENCOUNTER — Telehealth: Payer: Self-pay | Admitting: Internal Medicine

## 2018-10-04 NOTE — Telephone Encounter (Signed)
Pt calling stating she is having issues with reflux and gas. She is taking dexilant every am and had tried gas-x. She states she is waking up in the am with gas. Requesting to see Dr. Henrene Pastor. Pt was offered first available but wants to be seen sooner. Discussed with her that we often schedule with an APP if the MD does not have any sooner appt. Pt does not want to see an APP. Pt is hoping that if I "pass you a little note" you will work her in. Please advise.

## 2018-10-04 NOTE — Telephone Encounter (Signed)
Let her know that I will work her into my schedule as an add-on Wednesday, November 20 at 11:15 AM.  In the meantime, have her go to the drugstore and pick up IBgard and take 1 twice daily until her appointment

## 2018-10-04 NOTE — Telephone Encounter (Signed)
Spoke with pt and she is aware. Appt scheduled.

## 2018-10-05 ENCOUNTER — Telehealth: Payer: Self-pay | Admitting: Internal Medicine

## 2018-10-05 NOTE — Telephone Encounter (Signed)
Pt called to inform that she will not be able to make it to appt on 10/18/18, she did not realize that her husband has an important appt at neurology the same day at the same time, she stated that she could come either earlier or later that day or is requesting another day before December. She apologized and would appreciate if we can squeeze her in before December. Pls call her.

## 2018-10-05 NOTE — Telephone Encounter (Signed)
See note below and advise. 

## 2018-10-05 NOTE — Telephone Encounter (Signed)
Pt aware and scheduled to see Dr. Henrene Pastor 11/06/18@11am . Pt aware of appt.

## 2018-10-05 NOTE — Telephone Encounter (Signed)
Please let her know that unfortunately I do not have any openings at this time.  Fully booked.  I am out of town a portion of this month, then Thanksgiving, then I am in the hospital the first week of December, so less opportunities than usual.  Continue with IBgard twice daily until the eventual appointment as previously recommended.  We can look for patient cancellations in the interim in which case we would be happy to fit her in.  Thanks

## 2018-10-11 ENCOUNTER — Ambulatory Visit: Payer: Medicare Other | Admitting: Gynecology

## 2018-10-11 ENCOUNTER — Encounter: Payer: Self-pay | Admitting: Gynecology

## 2018-10-11 ENCOUNTER — Ambulatory Visit (INDEPENDENT_AMBULATORY_CARE_PROVIDER_SITE_OTHER): Payer: Medicare Other | Admitting: Gynecology

## 2018-10-11 VITALS — BP 118/70

## 2018-10-11 DIAGNOSIS — N6019 Diffuse cystic mastopathy of unspecified breast: Secondary | ICD-10-CM | POA: Diagnosis not present

## 2018-10-11 NOTE — Progress Notes (Signed)
    Mertha Clyatt 16-Jun-1936 [id number]        82 y.o.  [id number] presents for breast exam.  She has a history of fibrocystic changes of both breasts and prefers a six-month physician exam.  Most recent mammogram 07/2018 was normal.  Past medical history,surgical history, problem list, medications, allergies, family history and social history were all reviewed and documented in the EPIC chart.  Directed ROS with pertinent positives and negatives documented in the history of present illness/assessment and plan.  Exam: [name] assistant Vitals:   10/11/18 1504  BP: 118/70   General appearance:  Normal  Both breasts examined lying and sitting without masses, retractions, discharge, adenopathy.  Assessment/Plan:  82 y.o. [id number] with normal breast exam.  She will follow-up in 6 months when due for annual exam.   Anastasio Auerbach MD, 3:31 PM 10/11/2018

## 2018-10-11 NOTE — Patient Instructions (Signed)
Follow-up for your annual exam May 2020 when due.

## 2018-10-15 ENCOUNTER — Other Ambulatory Visit: Payer: Self-pay | Admitting: Internal Medicine

## 2018-10-18 ENCOUNTER — Ambulatory Visit: Payer: Self-pay | Admitting: Internal Medicine

## 2018-10-20 ENCOUNTER — Other Ambulatory Visit: Payer: Self-pay | Admitting: Internal Medicine

## 2018-10-30 DIAGNOSIS — J301 Allergic rhinitis due to pollen: Secondary | ICD-10-CM | POA: Diagnosis not present

## 2018-10-30 DIAGNOSIS — J3089 Other allergic rhinitis: Secondary | ICD-10-CM | POA: Diagnosis not present

## 2018-10-30 DIAGNOSIS — J4520 Mild intermittent asthma, uncomplicated: Secondary | ICD-10-CM | POA: Diagnosis not present

## 2018-10-30 DIAGNOSIS — T63441A Toxic effect of venom of bees, accidental (unintentional), initial encounter: Secondary | ICD-10-CM | POA: Diagnosis not present

## 2018-11-02 DIAGNOSIS — R05 Cough: Secondary | ICD-10-CM | POA: Diagnosis not present

## 2018-11-02 DIAGNOSIS — T63441A Toxic effect of venom of bees, accidental (unintentional), initial encounter: Secondary | ICD-10-CM | POA: Diagnosis not present

## 2018-11-02 DIAGNOSIS — J3089 Other allergic rhinitis: Secondary | ICD-10-CM | POA: Diagnosis not present

## 2018-11-02 DIAGNOSIS — J4520 Mild intermittent asthma, uncomplicated: Secondary | ICD-10-CM | POA: Diagnosis not present

## 2018-11-03 ENCOUNTER — Other Ambulatory Visit: Payer: Self-pay | Admitting: Internal Medicine

## 2018-11-03 ENCOUNTER — Ambulatory Visit
Admission: RE | Admit: 2018-11-03 | Discharge: 2018-11-03 | Disposition: A | Discharge status: Home / Self Care | Payer: Medicare Other | Source: Ambulatory Visit | Attending: Allergy and Immunology | Admitting: Allergy and Immunology

## 2018-11-03 ENCOUNTER — Other Ambulatory Visit: Payer: Self-pay | Admitting: Allergy and Immunology

## 2018-11-03 DIAGNOSIS — J9811 Atelectasis: Secondary | ICD-10-CM | POA: Diagnosis not present

## 2018-11-03 DIAGNOSIS — R05 Cough: Secondary | ICD-10-CM

## 2018-11-03 DIAGNOSIS — R059 Cough, unspecified: Secondary | ICD-10-CM

## 2018-11-06 ENCOUNTER — Encounter: Payer: Self-pay | Admitting: Internal Medicine

## 2018-11-06 ENCOUNTER — Ambulatory Visit (INDEPENDENT_AMBULATORY_CARE_PROVIDER_SITE_OTHER): Payer: Medicare Other | Admitting: Internal Medicine

## 2018-11-06 VITALS — BP 124/70 | HR 66 | Ht 62.0 in | Wt 125.0 lb

## 2018-11-06 DIAGNOSIS — R140 Abdominal distension (gaseous): Secondary | ICD-10-CM

## 2018-11-06 DIAGNOSIS — K589 Irritable bowel syndrome without diarrhea: Secondary | ICD-10-CM | POA: Diagnosis not present

## 2018-11-06 DIAGNOSIS — K219 Gastro-esophageal reflux disease without esophagitis: Secondary | ICD-10-CM | POA: Diagnosis not present

## 2018-11-06 NOTE — Progress Notes (Signed)
HISTORY OF PRESENT ILLNESS:  [name] is a 82 y.o. female with GERD, IBS, and functional GI complaints.  She was last evaluated in the office October 2018.  She presents today after contacting the office with complaints of worsening IBS as manifested by bloating.  Also worsening reflux.  Until her appointment I recommended IBgard which she has been using.  In addition she has been avoiding dairy somewhat.  She continues with moderate gluten restriction.  [name] is happy to report that her symptoms have significantly improved.  She is pleased.  She attributes this to IBgard and dietary change.  She takes Dexilant for her reflux.  No new or active issues though she does have a number of questions regarding her diet and recommendations moving forward.  She has aged out of screening colonoscopy  REVIEW OF SYSTEMS:  All non-GI ROS negative unless otherwise stated in the HPI except for allergies  Past Medical History:  Diagnosis Date  . Allergic rhinitis   . Atrophic vaginitis   . Baker's cyst    Left-Dr. Aluisio  . Cataract    Dr. Katy Fitch  . Colon polyps   . Fibroid   . Gastritis    chronic  . GERD (gastroesophageal reflux disease)   . Heart murmur   . Hemorrhoids   . Hyperlipidemia   . IBS (irritable bowel syndrome)   . MVP (mitral valve prolapse)    Antibiotics required for dental procedures  . OA (osteoarthritis)   . Osteoporosis 06/2018   T score -2.2 stable on Prolia  . Overactive bladder   . Thyroid disease   . Thyroid nodule   . Torn meniscus    bilateral    Past Surgical History:  Procedure Laterality Date  . CATARACT EXTRACTION, BILATERAL    . TONSILLECTOMY      Social History [name]  reports that she has quit smoking. She has never used smokeless tobacco. She reports that she drinks about 5.0 standard drinks of alcohol per week. She reports that she does not use drugs.  family history includes Congestive Heart Failure in her mother; Heart attack in her brother;  Heart disease in her mother; Hypertension in her mother; Osteoporosis in her mother; Pancreatic cancer in her father.  Allergies  Allergen Reactions  . Bee Venom Itching, Swelling and Rash    Itching, swelling and rash with bee stings, patient has epi pen  . Doxycycline     ??  . Neosporin [Neomycin-Bacitracin Zn-Polymyx]        PHYSICAL EXAMINATION: Vital signs: BP 124/70   Pulse 66   Ht 5\' 2"  (1.575 m)   Wt 125 lb (56.7 kg)   BMI 22.86 kg/m   Constitutional: generally well-appearing, no acute distress Psychiatric: alert and oriented x3, cooperative Eyes: extraocular movements intact, anicteric, conjunctiva pink Mouth: oral pharynx moist, no lesions Neck: supple no lymphadenopathy Cardiovascular: heart regular rate and rhythm, no murmur Lungs: clear to auscultation bilaterally Abdomen: soft, nontender, nondistended, no obvious ascites, no peritoneal signs, normal bowel sounds, no organomegaly Rectal: Omitted Extremities: no clubbing, cyanosis, or lower extremity edema bilaterally Skin: no lesions on visible extremities Neuro: No focal deficits.  Cranial nerves intact  ASSESSMENT:  1.  IBS and functional abdominal complaints including increased intestinal gas.  Improved with IBgard 2.  GERD.  No alarm features.  On Dexilant  PLAN:  1.  Continue IBgard for 1 month.  May use thereafter on demand 2.  Discussed dietary changes such as lactose avoidance, the use  of Lactaid with dairy products, and gluten avoidance 3.  Routine GI follow-up as needed

## 2018-11-06 NOTE — Patient Instructions (Signed)
Please follow up as needed 

## 2018-11-08 ENCOUNTER — Ambulatory Visit: Payer: Self-pay | Admitting: Internal Medicine

## 2018-11-09 DIAGNOSIS — J4520 Mild intermittent asthma, uncomplicated: Secondary | ICD-10-CM | POA: Diagnosis not present

## 2018-11-09 DIAGNOSIS — J301 Allergic rhinitis due to pollen: Secondary | ICD-10-CM | POA: Diagnosis not present

## 2018-11-09 DIAGNOSIS — T63441A Toxic effect of venom of bees, accidental (unintentional), initial encounter: Secondary | ICD-10-CM | POA: Diagnosis not present

## 2018-11-09 DIAGNOSIS — J3089 Other allergic rhinitis: Secondary | ICD-10-CM | POA: Diagnosis not present

## 2018-11-10 ENCOUNTER — Other Ambulatory Visit (INDEPENDENT_AMBULATORY_CARE_PROVIDER_SITE_OTHER): Payer: Medicare Other

## 2018-11-10 ENCOUNTER — Ambulatory Visit (INDEPENDENT_AMBULATORY_CARE_PROVIDER_SITE_OTHER): Payer: Medicare Other | Admitting: Family

## 2018-11-10 ENCOUNTER — Encounter: Payer: Self-pay | Admitting: Family

## 2018-11-10 VITALS — BP 122/70 | HR 84 | Temp 97.8°F | Ht 62.0 in

## 2018-11-10 DIAGNOSIS — E034 Atrophy of thyroid (acquired): Secondary | ICD-10-CM

## 2018-11-10 DIAGNOSIS — R5383 Other fatigue: Secondary | ICD-10-CM

## 2018-11-10 DIAGNOSIS — R079 Chest pain, unspecified: Secondary | ICD-10-CM

## 2018-11-10 DIAGNOSIS — E559 Vitamin D deficiency, unspecified: Secondary | ICD-10-CM

## 2018-11-10 DIAGNOSIS — Z13220 Encounter for screening for lipoid disorders: Secondary | ICD-10-CM | POA: Diagnosis not present

## 2018-11-10 DIAGNOSIS — R200 Anesthesia of skin: Secondary | ICD-10-CM

## 2018-11-10 DIAGNOSIS — R202 Paresthesia of skin: Secondary | ICD-10-CM

## 2018-11-10 LAB — COMPREHENSIVE METABOLIC PANEL
ALT: 29 U/L (ref 0–35)
AST: 22 U/L (ref 0–37)
Albumin: 4.3 g/dL (ref 3.5–5.2)
Alkaline Phosphatase: 36 U/L — ABNORMAL LOW (ref 39–117)
BUN: 19 mg/dL (ref 6–23)
CO2: 25 mEq/L (ref 19–32)
Calcium: 9.1 mg/dL (ref 8.4–10.5)
Chloride: 100 mEq/L (ref 96–112)
Creatinine, Ser: 0.72 mg/dL (ref 0.40–1.20)
GFR: 82.76 mL/min (ref >60.00)
Glucose, Bld: 96 mg/dL (ref 70–99)
Potassium: 3.6 mEq/L (ref 3.5–5.1)
Sodium: 135 mEq/L (ref 135–145)
Total Bilirubin: 0.6 mg/dL (ref 0.2–1.2)
Total Protein: 7.4 g/dL (ref 6.0–8.3)

## 2018-11-10 LAB — CBC WITH DIFFERENTIAL/PLATELET
Basophils Absolute: 0.1 10*3/uL (ref 0.0–0.1)
Basophils Relative: 0.7 % (ref 0.0–3.0)
Eosinophils Absolute: 0.1 10*3/uL (ref 0.0–0.7)
Eosinophils Relative: 1.5 % (ref 0.0–5.0)
HCT: 39.8 % (ref 36.0–46.0)
Hemoglobin: 13.6 g/dL (ref 12.0–15.0)
Lymphocytes Relative: 11.2 % — ABNORMAL LOW (ref 12.0–46.0)
Lymphs Abs: 0.9 10*3/uL (ref 0.7–4.0)
MCHC: 34.3 g/dL (ref 30.0–36.0)
MCV: 94.4 fl (ref 78.0–100.0)
Monocytes Absolute: 0.7 10*3/uL (ref 0.1–1.0)
Monocytes Relative: 9.2 % (ref 3.0–12.0)
Neutro Abs: 5.9 10*3/uL (ref 1.4–7.7)
Neutrophils Relative %: 77.4 % — ABNORMAL HIGH (ref 43.0–77.0)
Platelets: 268 10*3/uL (ref 150.0–400.0)
RBC: 4.22 Mil/uL (ref 3.87–5.11)
RDW: 13.2 % (ref 11.5–15.5)
WBC: 7.6 10*3/uL (ref 4.0–10.5)

## 2018-11-10 LAB — LIPID PANEL
Cholesterol: 197 mg/dL (ref 0–200)
HDL: 63.1 mg/dL (ref >39.00)
LDL Cholesterol: 114 mg/dL — ABNORMAL HIGH (ref 0–99)
NonHDL: 133.65
Total CHOL/HDL Ratio: 3
Triglycerides: 100 mg/dL (ref 0.0–149.0)
VLDL: 20 mg/dL (ref 0.0–40.0)

## 2018-11-10 LAB — VITAMIN B12: Vitamin B-12: 394 pg/mL (ref 211–911)

## 2018-11-10 LAB — TSH: TSH: 1.14 u[IU]/mL (ref 0.35–4.50)

## 2018-11-10 LAB — VITAMIN D 25 HYDROXY (VIT D DEFICIENCY, FRACTURES): VITD: 42.54 ng/mL (ref 30.00–100.00)

## 2018-11-10 NOTE — Progress Notes (Signed)
[name] is a 82 y.o. female with the following history as recorded in EpicCare:  Patient Active Problem List   Diagnosis Date Noted  . Anxiety 08/22/2018  . Contact dermatitis and eczema 06/14/2018  . Constipation 11/11/2016  . Low back pain 11/10/2016  . Leg abrasion 08/12/2016  . Contusion of right knee 08/12/2016  . Sinusitis, chronic 08/06/2016  . Rash and nonspecific skin eruption 04/20/2016  . Aortic valve sclerosis 03/12/2015  . Contusion of left chest wall 01/09/2015  . Fatigue 09/08/2014  . Postherpetic neuralgia 10/02/2013  . Impacted cerumen of right ear 07/16/2013  . Headache 07/10/2013  . Aortic stenosis 04/19/2013  . Insomnia 01/24/2013  . Cough 08/07/2012  . URI (upper respiratory infection) 08/03/2012  . Pruritus of skin 03/11/2012  . MVP (mitral valve prolapse)   . Multiple thyroid nodules 10/04/2011  . Herpes zoster 09/07/2011  . Essential hypertension, benign 07/27/2011  . Atrophic vaginitis   . Well adult exam 03/15/2011  . Hyperlipemia, mixed 03/15/2011  . OVERACTIVE BLADDER 09/02/2010  . Hypothyroidism 03/03/2010  . CALF PAIN, LEFT 10/27/2009  . FLATULENCE-GAS-BLOATING 07/21/2009  . EAR PAIN 12/19/2008  . Allergic rhinitis 12/19/2008  . Irritable bowel syndrome 09/04/2008  . PERSONAL HX COLONIC POLYPS 09/04/2008  . GASTRITIS, CHRONIC 09/03/2008  . DUODENITIS, WITHOUT HEMORRHAGE 09/03/2008  . TIBIALIS TENDINITIS 03/20/2008  . OSTEOARTHRITIS 02/25/2008  . SWEATING 02/21/2008  . LACTOSE INTOLERANCE 11/20/2007  . Dyslipidemia 11/20/2007  . GERD 11/20/2007  . OSTEOPOROSIS 11/20/2007    Current Outpatient Medications  Medication Sig Dispense Refill  . albuterol (PROVENTIL HFA;VENTOLIN HFA) 108 (90 BASE) MCG/ACT inhaler Inhale 2 puffs into the lungs every 6 (six) hours as needed for wheezing. 1 Inhaler 3  . azelastine (ASTELIN) 0.1 % nasal spray Place into both nostrils at bedtime. Use in each nostril as directed    . Azelastine HCl 0.15 % SOLN      . Calcium Carbonate-Vitamin D (CALCIUM-VITAMIN D) 500-200 MG-UNIT per tablet Take 3 tablets by mouth daily.     . Cholecalciferol 4000 units TABS Take 1 tablet by mouth daily.    [name] desonide (DESOWEN) 0.05 % cream Apply topically 2 (two) times daily. 30 g 0  . dexlansoprazole (DEXILANT) 60 MG capsule TAKE (1) CAPSULE DAILY. 30 capsule 3  . diclofenac sodium (VOLTAREN) 1 % GEL APPLY 4 GRAMS 4 TIMES DAILY. 100 g 3  . EPIPEN 2-PAK 0.3 MG/0.3ML SOAJ injection Inject 0.3 mg as directed as needed.     [name] ipratropium (ATROVENT) 0.06 % nasal spray Place 1 spray into the nose 3 (three) times daily as needed for rhinitis. (Patient taking differently: Place 1 spray into the nose 2 (two) times daily. ) 15 mL 0  . levothyroxine (SYNTHROID) 50 MCG tablet TAKE 1 TABLET ONCE DAILY BEFORE BREAKFAST. 30 tablet 11  . LORazepam (ATIVAN) 1 MG tablet TAKE 1 OR 2 TABLETS TWICE A DAY AS NEEDED FOR ANXIETY. (Patient taking differently: at bedtime. qhs) 100 tablet 3  . mometasone (NASONEX) 50 MCG/ACT nasal spray Place 2 sprays into the nose every morning.     . montelukast (SINGULAIR) 10 MG tablet Take 10 mg by mouth at bedtime.     [name] nystatin-triamcinolone ointment (MYCOLOG) Apply 1 application topically 2 (two) times daily. (Patient taking differently: Apply 1 application topically as needed. ) 30 g 1  . SALINE NASAL SPRAY NA Place into the nose.    . simvastatin (ZOCOR) 40 MG tablet TAKE 1 TABLET ONCE DAILY. 90 tablet 0  .  VESICARE 10 MG tablet Take 1 tablet by mouth as needed.     [name] amoxicillin (AMOXIL) 500 MG capsule Reported on 06/11/2016    . budesonide-formoterol (SYMBICORT) 160-4.5 MCG/ACT inhaler Inhale 2 puffs 2 (two) times daily into the lungs. 1 Inhaler 3   No current facility-administered medications for this visit.     Allergies: Bee venom; Doxycycline; and Neosporin [neomycin-bacitracin zn-polymyx]  Past Medical History:  Diagnosis Date  . Allergic rhinitis   . Atrophic vaginitis   . Baker's cyst     Left-Dr. Aluisio  . Cataract    Dr. Katy Fitch  . Colon polyps   . Fibroid   . Gastritis    chronic  . GERD (gastroesophageal reflux disease)   . Heart murmur   . Hemorrhoids   . Hyperlipidemia   . IBS (irritable bowel syndrome)   . MVP (mitral valve prolapse)    Antibiotics required for dental procedures  . OA (osteoarthritis)   . Osteoporosis 06/2018   T score -2.2 stable on Prolia  . Overactive bladder   . Thyroid disease   . Thyroid nodule   . Torn meniscus    bilateral    Past Surgical History:  Procedure Laterality Date  . CATARACT EXTRACTION, BILATERAL    . TONSILLECTOMY      Family History  Problem Relation Age of Onset  . Heart disease Mother   . Hypertension Mother   . Osteoporosis Mother   . Congestive Heart Failure Mother   . Pancreatic cancer Father   . Heart attack Brother   . Colon cancer Neg Hx   . Colon polyps Neg Hx   . Rectal cancer Neg Hx   . Stomach cancer Neg Hx     Social History   Tobacco Use  . Smoking status: Former Research scientist (life sciences)  . Smokeless tobacco: Never Used  Substance Use Topics  . Alcohol use: Yes    Alcohol/week: 5.0 standard drinks    Types: 5 Standard drinks or equivalent per week    Comment: Socially    Subjective:  Patient presents with concerns for fatigue; saw her allergist/ pulmonologist yesterday- requested that patient come back and get labs drawn including CBC, TSH; allergist also asked that patient get EKG done since she had been having atypical chest pain- thought to be GERD related; Denies any shortness of breath, blurred vision or headache.    Objective:  Vitals:   11/10/18 1122  BP: 122/70  Pulse: 84  Temp: 97.8 F (36.6 C)  TempSrc: Oral  SpO2: 95%  Height: 5' 2"  (1.575 m)    General: Well developed, well nourished, in no acute distress  Skin : Warm and dry.  Head: Normocephalic and atraumatic  Lungs: Respirations unlabored; clear to auscultation bilaterally without wheeze, rales, rhonchi  CVS exam: normal  rate and regular rhythm.  Neurologic: Alert and oriented; speech intact; face symmetrical; moves all extremities well; CNII-XII intact without focal deficit   Assessment:  1. Other fatigue   2. Hypothyroidism due to acquired atrophy of thyroid   3. Numbness and tingling   4. Vitamin D deficiency   5. Lipid screening   6. Chest pain, unspecified type     Plan:  Check EKG- NSR/ no acute ischemic changes; will update labs as requested; follow-up to be determined.   No follow-ups on file.  Orders Placed This Encounter  Procedures  . CBC w/Diff    Standing Status:   Future    Standing Expiration Date:   11/10/2019  .  Comp Met (CMET)    Standing Status:   Future    Standing Expiration Date:   11/10/2019  . TSH    Standing Status:   Future    Standing Expiration Date:   11/10/2019  . B12    Standing Status:   Future    Standing Expiration Date:   11/10/2019  . Vitamin D (25 hydroxy)    Standing Status:   Future    Standing Expiration Date:   11/10/2019  . Lipid panel    Standing Status:   Future    Standing Expiration Date:   11/11/2019  . EKG 12-Lead    Requested Prescriptions    No prescriptions requested or ordered in this encounter

## 2018-11-13 ENCOUNTER — Encounter: Payer: Self-pay | Admitting: Family

## 2018-11-15 ENCOUNTER — Ambulatory Visit: Payer: Medicare Other | Admitting: Podiatry

## 2018-12-07 ENCOUNTER — Other Ambulatory Visit: Payer: Self-pay | Admitting: Internal Medicine

## 2018-12-11 ENCOUNTER — Telehealth: Payer: Self-pay

## 2018-12-11 ENCOUNTER — Telehealth: Payer: Self-pay | Admitting: Internal Medicine

## 2018-12-11 NOTE — Telephone Encounter (Signed)
Patient called to let me know she found out that the $300 was a one time charge to meet her deductible and then the price would go back down.  She will continue to take the [place].

## 2018-12-11 NOTE — Telephone Encounter (Signed)
Lm on vm 

## 2018-12-12 ENCOUNTER — Encounter: Payer: Self-pay | Admitting: Gynecology

## 2018-12-12 ENCOUNTER — Other Ambulatory Visit: Payer: Self-pay | Admitting: Internal Medicine

## 2018-12-20 ENCOUNTER — Encounter: Payer: Self-pay | Admitting: Internal Medicine

## 2018-12-20 ENCOUNTER — Ambulatory Visit (INDEPENDENT_AMBULATORY_CARE_PROVIDER_SITE_OTHER): Payer: Medicare Other | Admitting: Internal Medicine

## 2018-12-20 ENCOUNTER — Other Ambulatory Visit (INDEPENDENT_AMBULATORY_CARE_PROVIDER_SITE_OTHER): Payer: Medicare Other

## 2018-12-20 VITALS — BP 134/78 | HR 80 | Temp 98.0°F | Ht 62.0 in

## 2018-12-20 DIAGNOSIS — I10 Essential (primary) hypertension: Secondary | ICD-10-CM | POA: Diagnosis not present

## 2018-12-20 DIAGNOSIS — G8929 Other chronic pain: Secondary | ICD-10-CM

## 2018-12-20 DIAGNOSIS — E785 Hyperlipidemia, unspecified: Secondary | ICD-10-CM | POA: Diagnosis not present

## 2018-12-20 DIAGNOSIS — M545 Low back pain, unspecified: Secondary | ICD-10-CM

## 2018-12-20 DIAGNOSIS — F419 Anxiety disorder, unspecified: Secondary | ICD-10-CM | POA: Diagnosis not present

## 2018-12-20 LAB — URINALYSIS
Bilirubin Urine: NEGATIVE
Hgb urine dipstick: NEGATIVE
Ketones, ur: NEGATIVE
Leukocytes, UA: NEGATIVE
Nitrite: NEGATIVE
Specific Gravity, Urine: 1.015 (ref 1.000–1.030)
Total Protein, Urine: NEGATIVE
Urine Glucose: NEGATIVE
Urobilinogen, UA: 0.2 (ref 0.0–1.0)
pH: 5.5 (ref 5.0–8.0)

## 2018-12-20 MED ORDER — EPINEPHRINE 0.3 MG/0.3ML IJ SOAJ: 0.3000 mg | INTRAMUSCULAR | 1 refills | Status: AC | PRN

## 2018-12-20 MED ORDER — AZITHROMYCIN 250 MG PO TABS: ORAL_TABLET | ORAL | 0 refills | Status: DC

## 2018-12-20 MED ORDER — METHYLPREDNISOLONE 4 MG PO TBPK: ORAL_TABLET | ORAL | 0 refills | Status: DC

## 2018-12-20 NOTE — Progress Notes (Signed)
Subjective:  Patient ID: [name], female    DOB: 1936/10/15  Age: 83 y.o. MRN: [id number]  CC: No chief complaint on file.   HPI [name] presents for [name] on the R x2 d, allergies. F/u asthma F/u dyslipidemia Traveling on a cruise in Feb  Outpatient Medications Prior to Visit  Medication Sig Dispense Refill  . albuterol (PROVENTIL HFA;VENTOLIN HFA) 108 (90 BASE) MCG/ACT inhaler Inhale 2 puffs into the lungs every 6 (six) hours as needed for wheezing. 1 Inhaler 3  . amoxicillin (AMOXIL) 500 MG capsule Reported on 06/11/2016    . azelastine (ASTELIN) 0.1 % nasal spray Place into both nostrils at bedtime. Use in each nostril as directed    . Azelastine HCl 0.15 % SOLN     . Calcium Carbonate-Vitamin D (CALCIUM-VITAMIN D) 500-200 MG-UNIT per tablet Take 3 tablets by mouth daily.     . Cholecalciferol 4000 units TABS Take 1 tablet by mouth daily.    [name] desonide (DESOWEN) 0.05 % cream Apply topically 2 (two) times daily. 30 g 0  . dexlansoprazole (DEXILANT) 60 MG capsule TAKE (1) CAPSULE DAILY. 30 capsule 3  . diclofenac sodium (VOLTAREN) 1 % GEL APPLY 4 GRAMS 4 TIMES DAILY. 100 g 3  . EPIPEN 2-PAK 0.3 MG/0.3ML SOAJ injection Inject 0.3 mg as directed as needed.     [name] ipratropium (ATROVENT) 0.06 % nasal spray Place 1 spray into the nose 3 (three) times daily as needed for rhinitis. (Patient taking differently: Place 1 spray into the nose 2 (two) times daily. ) 15 mL 0  . levothyroxine (SYNTHROID) 50 MCG tablet TAKE 1 TABLET ONCE DAILY BEFORE BREAKFAST. 30 tablet 11  . LORazepam (ATIVAN) 1 MG tablet TAKE 1 OR 2 TABLETS TWICE A DAY AS NEEDED FOR ANXIETY. 100 tablet 2  . mometasone (NASONEX) 50 MCG/ACT nasal spray Place 2 sprays into the nose every morning.     . montelukast (SINGULAIR) 10 MG tablet Take 10 mg by mouth at bedtime.     [name] nystatin-triamcinolone ointment (MYCOLOG) Apply 1 application topically 2 (two) times daily. (Patient taking differently: Apply 1 application topically as  needed. ) 30 g 1  . SALINE NASAL SPRAY NA Place into the nose.    . simvastatin (ZOCOR) 40 MG tablet TAKE 1 TABLET ONCE DAILY. 90 tablet 1  . VESICARE 10 MG tablet Take 1 tablet by mouth as needed.     . budesonide-formoterol (SYMBICORT) 160-4.5 MCG/ACT inhaler Inhale 2 puffs 2 (two) times daily into the lungs. 1 Inhaler 3   No facility-administered medications prior to visit.     ROS: Review of Systems  Constitutional: Negative for activity change, appetite change, chills, fatigue and unexpected weight change.  HENT: Negative for congestion, mouth sores and sinus pressure.   Eyes: Negative for visual disturbance.  Respiratory: Negative for cough and chest tightness.   Gastrointestinal: Negative for abdominal pain and nausea.  Genitourinary: Negative for difficulty urinating, frequency and vaginal pain.  Musculoskeletal: Positive for back pain. Negative for gait problem.  Skin: Negative for pallor and rash.  Neurological: Negative for dizziness, tremors, weakness, numbness and headaches.  Psychiatric/Behavioral: Negative for confusion and sleep disturbance.    Objective:  BP 134/78 (BP Location: Left Arm, Patient Position: Sitting, Cuff Size: Normal)   Pulse 80   Temp 98 F (36.7 C) (Oral)   Ht 5\' 2"  (1.575 m)   SpO2 98%   BMI 22.86 kg/m   BP Readings from Last 3 Encounters:  12/20/18  134/78  11/10/18 122/70  11/06/18 124/70    Wt Readings from Last 3 Encounters:  11/06/18 125 lb (56.7 kg)  04/07/17 125 lb (56.7 kg)  04/01/17 122 lb (55.3 kg)    Physical Exam Constitutional:      General: She is not in acute distress.    Appearance: She is well-developed.  HENT:     Head: Normocephalic.     Right Ear: External ear normal.     Left Ear: External ear normal.     Nose: Nose normal.  Eyes:     General:        Right eye: No discharge.        Left eye: No discharge.     Conjunctiva/sclera: Conjunctivae normal.     Pupils: Pupils are equal, round, and reactive to  light.  Neck:     Musculoskeletal: Normal range of motion and neck supple.     Thyroid: No thyromegaly.     Vascular: No JVD.     Trachea: No tracheal deviation.  Cardiovascular:     Rate and Rhythm: Normal rate and regular rhythm.     Heart sounds: Normal heart sounds.  Pulmonary:     Effort: No respiratory distress.     Breath sounds: No stridor. No wheezing.  Abdominal:     General: Bowel sounds are normal. There is no distension.     Palpations: Abdomen is soft. There is no mass.     Tenderness: There is no abdominal tenderness. There is no guarding or rebound.  Musculoskeletal:        General: No tenderness.  Lymphadenopathy:     Cervical: No cervical adenopathy.  Skin:    Findings: No erythema or rash.  Neurological:     Cranial Nerves: No cranial nerve deficit.     Motor: No abnormal muscle tone.     Coordination: Coordination normal.     Deep Tendon Reflexes: Reflexes normal.  Psychiatric:        Behavior: Behavior normal.        Thought Content: Thought content normal.        Judgment: Judgment normal.   R flank is a little sensitive   Lab Results  Component Value Date   WBC 7.6 11/10/2018   HGB 13.6 11/10/2018   HCT 39.8 11/10/2018   PLT 268.0 11/10/2018   GLUCOSE 96 11/10/2018   CHOL 197 11/10/2018   TRIG 100.0 11/10/2018   HDL 63.10 11/10/2018   LDLDIRECT 117.0 06/09/2018   LDLCALC 114 (H) 11/10/2018   ALT 29 11/10/2018   AST 22 11/10/2018   NA 135 11/10/2018   K 3.6 11/10/2018   CL 100 11/10/2018   CREATININE 0.72 11/10/2018   BUN 19 11/10/2018   CO2 25 11/10/2018   TSH 1.14 11/10/2018   HGBA1C 6.0 07/08/2016    Dg Chest 2 View  Result Date: 11/03/2018 CLINICAL DATA:  Productive cough. EXAM: CHEST - 2 VIEW COMPARISON:  10/05/2017. FINDINGS: Mediastinum and hilar structures normal. Mild left base subsegmental atelectasis. No pleural effusion or pneumothorax. IMPRESSION: Mild left base subsegmental atelectasis and or scarring. Electronically  Signed   By: Marcello Moores  Register   On: 11/03/2018 14:12    Assessment & Plan:   There are no diagnoses linked to this encounter.   No orders of the defined types were placed in this encounter.    Follow-up: No follow-ups on file.  Walker Kehr, MD

## 2018-12-20 NOTE — Patient Instructions (Addendum)
Weighted blanket  Cardiac CT calcium scoring test $150   Computed tomography, more commonly known as a CT or CAT scan, is a diagnostic medical imaging test. Like traditional x-rays, it produces multiple images or pictures of the inside of the body. The cross-sectional images generated during a CT scan can be reformatted in multiple planes. They can even generate three-dimensional images. These images can be viewed on a computer monitor, printed on film or by a 3D printer, or transferred to a CD or DVD. CT images of internal organs, bones, soft tissue and blood vessels provide greater detail than traditional x-rays, particularly of soft tissues and blood vessels. A cardiac CT scan for coronary calcium is a non-invasive way of obtaining information about the presence, location and extent of calcified plaque in the coronary arteries-the vessels that supply oxygen-containing blood to the heart muscle. Calcified plaque results when there is a build-up of fat and other substances under the inner layer of the artery. This material can calcify which signals the presence of atherosclerosis, a disease of the vessel wall, also called coronary artery disease (CAD). People with this disease have an increased risk for heart attacks. In addition, over time, progression of plaque build up (CAD) can narrow the arteries or even close off blood flow to the heart. The result may be chest pain, sometimes called "angina," or a heart attack. Because calcium is a marker of CAD, the amount of calcium detected on a cardiac CT scan is a helpful prognostic tool. The findings on cardiac CT are expressed as a calcium score. Another name for this test is coronary artery calcium scoring.  What are some common uses of the procedure? The goal of cardiac CT scan for calcium scoring is to determine if CAD is present and to what extent, even if there are no symptoms. It is a screening study that may be recommended by a physician for  patients with risk factors for CAD but no clinical symptoms. The major risk factors for CAD are: . high blood cholesterol levels  . family history of heart attacks  . diabetes  . high blood pressure  . cigarette smoking  . overweight or obese  . physical inactivity   A negative cardiac CT scan for calcium scoring shows no calcification within the coronary arteries. This suggests that CAD is absent or so minimal it cannot be seen by this technique. The chance of having a heart attack over the next two to five years is very low under these circumstances. A positive test means that CAD is present, regardless of whether or not the patient is experiencing any symptoms. The amount of calcification-expressed as the calcium score-may help to predict the likelihood of a myocardial infarction (heart attack) in the coming years and helps your medical doctor or cardiologist decide whether the patient may need to take preventive medicine or undertake other measures such as diet and exercise to lower the risk for heart attack. The extent of CAD is graded according to your calcium score:  Calcium Score  Presence of CAD  0 No evidence of CAD   1-10 Minimal evidence of CAD  11-100 Mild evidence of CAD  101-400 Moderate evidence of CAD  Over 400 Extensive evidence of CAD

## 2018-12-21 NOTE — Assessment & Plan Note (Signed)
UA

## 2018-12-21 NOTE — Assessment & Plan Note (Signed)
cardiac CT scan for calcium scoring offered 

## 2018-12-21 NOTE — Assessment & Plan Note (Signed)
Lexapro - not taking Weighted blanket Lorazepam prn  Potential benefits of a long term benzodiazepines  use as well as potential risks  and complications were explained to the patient and were aknowledged.

## 2018-12-25 ENCOUNTER — Ambulatory Visit (INDEPENDENT_AMBULATORY_CARE_PROVIDER_SITE_OTHER)
Admission: RE | Admit: 2018-12-25 | Discharge: 2018-12-25 | Disposition: A | Discharge status: Home / Self Care | Payer: Medicare Other | Source: Ambulatory Visit | Attending: Nurse Practitioner | Admitting: Nurse Practitioner

## 2018-12-25 ENCOUNTER — Ambulatory Visit: Payer: Self-pay | Admitting: *Deleted

## 2018-12-25 ENCOUNTER — Other Ambulatory Visit (INDEPENDENT_AMBULATORY_CARE_PROVIDER_SITE_OTHER): Payer: Medicare Other

## 2018-12-25 ENCOUNTER — Encounter: Payer: Self-pay | Admitting: Nurse Practitioner

## 2018-12-25 ENCOUNTER — Ambulatory Visit (INDEPENDENT_AMBULATORY_CARE_PROVIDER_SITE_OTHER): Payer: Medicare Other | Admitting: Nurse Practitioner

## 2018-12-25 VITALS — BP 150/70 | HR 80 | Temp 98.3°F | Ht 62.0 in

## 2018-12-25 DIAGNOSIS — R0781 Pleurodynia: Secondary | ICD-10-CM

## 2018-12-25 DIAGNOSIS — W19XXXS Unspecified fall, sequela: Secondary | ICD-10-CM

## 2018-12-25 DIAGNOSIS — R519 Headache, unspecified: Secondary | ICD-10-CM

## 2018-12-25 DIAGNOSIS — S4992XA Unspecified injury of left shoulder and upper arm, initial encounter: Secondary | ICD-10-CM | POA: Diagnosis not present

## 2018-12-25 DIAGNOSIS — M25512 Pain in left shoulder: Secondary | ICD-10-CM

## 2018-12-25 DIAGNOSIS — R51 Headache: Secondary | ICD-10-CM | POA: Diagnosis not present

## 2018-12-25 DIAGNOSIS — R42 Dizziness and giddiness: Secondary | ICD-10-CM

## 2018-12-25 DIAGNOSIS — R350 Frequency of micturition: Secondary | ICD-10-CM

## 2018-12-25 DIAGNOSIS — S299XXA Unspecified injury of thorax, initial encounter: Secondary | ICD-10-CM | POA: Diagnosis not present

## 2018-12-25 LAB — COMPREHENSIVE METABOLIC PANEL
ALT: 23 U/L (ref 0–35)
AST: 25 U/L (ref 0–37)
Albumin: 4.4 g/dL (ref 3.5–5.2)
Alkaline Phosphatase: 44 U/L (ref 39–117)
BUN: 12 mg/dL (ref 6–23)
CO2: 26 mEq/L (ref 19–32)
Calcium: 8.9 mg/dL (ref 8.4–10.5)
Chloride: 95 mEq/L — ABNORMAL LOW (ref 96–112)
Creatinine, Ser: 0.6 mg/dL (ref 0.40–1.20)
GFR: 96.07 mL/min (ref >60.00)
Glucose, Bld: 104 mg/dL — ABNORMAL HIGH (ref 70–99)
Potassium: 4.2 mEq/L (ref 3.5–5.1)
Sodium: 130 mEq/L — ABNORMAL LOW (ref 135–145)
Total Bilirubin: 0.8 mg/dL (ref 0.2–1.2)
Total Protein: 7.5 g/dL (ref 6.0–8.3)

## 2018-12-25 LAB — URINALYSIS, ROUTINE W REFLEX MICROSCOPIC
Bilirubin Urine: NEGATIVE
Hgb urine dipstick: NEGATIVE
Ketones, ur: NEGATIVE
Leukocytes, UA: NEGATIVE
Nitrite: NEGATIVE
RBC / HPF: NONE SEEN (ref 0–?)
Specific Gravity, Urine: <=1.005 — AB (ref 1.000–1.030)
Total Protein, Urine: NEGATIVE
Urine Glucose: NEGATIVE
Urobilinogen, UA: 0.2 (ref 0.0–1.0)
WBC, UA: NONE SEEN (ref 0–?)
pH: 7.5 (ref 5.0–8.0)

## 2018-12-25 LAB — CBC
HCT: 39.1 % (ref 36.0–46.0)
Hemoglobin: 13.4 g/dL (ref 12.0–15.0)
MCHC: 34.2 g/dL (ref 30.0–36.0)
MCV: 94.5 fl (ref 78.0–100.0)
Platelets: 267 10*3/uL (ref 150.0–400.0)
RBC: 4.14 Mil/uL (ref 3.87–5.11)
RDW: 14 % (ref 11.5–15.5)
WBC: 10.3 10*3/uL (ref 4.0–10.5)

## 2018-12-25 NOTE — Patient Instructions (Signed)
Head downstairs for labs  You will be contacted to schedule CT scan of your head   Dizziness Dizziness is a common problem. It makes you feel unsteady or light-headed. You may feel like you are about to pass out (faint). Dizziness can lead to getting hurt if you stumble or fall. Dizziness can be caused by many things, including:  Medicines.  Not having enough water in your body (dehydration).  Illness. Follow these instructions at home: Eating and drinking   Drink enough fluid to keep your pee (urine) clear or pale yellow. This helps to keep you from getting dehydrated. Try to drink more clear fluids, such as water.  Do not drink alcohol.  Limit how much caffeine you drink or eat, if your doctor tells you to do that.  Limit how much salt (sodium) you drink or eat, if your doctor tells you to do that. Activity   Avoid making quick movements. ? When you stand up from sitting in a chair, steady yourself until you feel okay. ? In the morning, first sit up on the side of the bed. When you feel okay, stand slowly while you hold onto something. Do this until you know that your balance is fine.  If you need to stand in one place for a long time, move your legs often. Tighten and relax the muscles in your legs while you are standing.  Do not drive or use heavy machinery if you feel dizzy.  Avoid bending down if you feel dizzy. Place items in your home so you can reach them easily without leaning over. Lifestyle  Do not use any products that contain nicotine or tobacco, such as cigarettes and e-cigarettes. If you need help quitting, ask your doctor.  Try to lower your stress level. You can do this by using methods such as yoga or meditation. Talk with your doctor if you need help. General instructions  Watch your dizziness for any changes.  Take over-the-counter and prescription medicines only as told by your doctor. Talk with your doctor if you think that you are dizzy because of  a medicine that you are taking.  Tell a friend or a family member that you are feeling dizzy. If he or she notices any changes in your behavior, have this person call your doctor.  Keep all follow-up visits as told by your doctor. This is important. Contact a doctor if:  Your dizziness does not go away.  Your dizziness or light-headedness gets worse.  You feel sick to your stomach (nauseous).  You have trouble hearing.  You have new symptoms.  You are unsteady on your feet.  You feel like the room is spinning. Get help right away if:  You throw up (vomit) or have watery poop (diarrhea), and you cannot eat or drink anything.  You have trouble: ? Talking. ? Walking. ? Swallowing. ? Using your arms, hands, or legs.  You feel generally weak.  You are not thinking clearly, or you have trouble forming sentences. A friend or family member may notice this.  You have: ? Chest pain. ? Pain in your belly (abdomen). ? Shortness of breath. ? Sweating.  Your vision changes.  You are bleeding.  You have a very bad headache.  You have neck pain or a stiff neck.  You have a fever. These symptoms may be an emergency. Do not wait to see if the symptoms will go away. Get medical help right away. Call your local emergency services (911 in the  [place] Do not drive yourself to the hospital. Summary  Dizziness makes you feel unsteady or light-headed. You may feel like you are about to pass out (faint).  Drink enough fluid to keep your pee (urine) clear or pale yellow. Do not drink alcohol.  Avoid making quick movements if you feel dizzy.  Watch your dizziness for any changes. This information is not intended to replace advice given to you by your health care provider. Make sure you discuss any questions you have with your health care provider. Document Released: 11/04/2011 Document Revised: 12/02/2016 Document Reviewed: 12/02/2016 Elsevier Interactive Patient Education  2019  [employer].

## 2018-12-25 NOTE — Telephone Encounter (Signed)
Pt called with c/o light headedness. She stated her B/p was  155/80 about a hour and half ago. She tripped last night in the bathroom and hit her left rib cage. She has taken Tylenol for that discomfort. she took it at 5 am and 9 this morning.  She also stated that she has sl headache. C/o having a dry mouth. She is drinking water. No other cardiac symptoms: no shortness of breath, chest pain, weakness on one side of the body or other, no numbness or tingling. She rechecked her b/p and now it is 160/79 HR 83.  She has taken her b/p medications this morning.  Requesting to see Dr.  Alain Marion  And would like a call back from him or his nurse to see if she can see him. Appointment scheduled with A. Shambley for this afternoon.  Advised to call back if she starts having any of the above cardiac symptoms. Pt voiced understanding. Routing to flow at [facility] at [facility] for a call back from the provider.    Reason for Disposition . [1] MODERATE dizziness (e.g., interferes with normal activities) AND [2] has NOT been evaluated by physician for this  (Exception: dizziness caused by heat exposure, sudden standing, or poor fluid intake)  Answer Assessment - Initial Assessment Questions 1. DESCRIPTION: "Describe your dizziness."     lightheaded 2. LIGHTHEADED: "Do you feel lightheaded?" (e.g., somewhat faint, woozy, weak upon standing)     yes 3. VERTIGO: "Do you feel like either you or the room is spinning or tilting?" (i.e. vertigo)     no 4. SEVERITY: "How bad is it?"  "Do you feel like you are going to faint?" "Can you stand and walk?"   - MILD - walking normally   - MODERATE - interferes with normal activities (e.g., work, school)    - SEVERE - unable to stand, requires support to walk, feels like passing out now.      Can feel it when she is sitting down 5. ONSET:  "When did the dizziness begin?"     This morning 6. AGGRAVATING FACTORS: "Does anything make it worse?" (e.g., standing, change in head  position)     no 7. HEART RATE: "Can you tell me your heart rate?" "How many beats in 15 seconds?"  (Note: not all patients can do this)       no 8. CAUSE: "What do you think is causing the dizziness?"     Not sure 9. RECURRENT SYMPTOM: "Have you had dizziness before?" If so, ask: "When was the last time?" "What happened that time?"     Had it but very slight 10. OTHER SYMPTOMS: "Do you have any other symptoms?" (e.g., fever, chest pain, vomiting, diarrhea, bleeding)       Queasy, dry mouth, slight headache 11. PREGNANCY: "Is there any chance you are pregnant?" "When was your last menstrual period?"       no  Protocols used: DIZZINESS [facility]

## 2018-12-25 NOTE — Progress Notes (Signed)
[name] is a 83 y.o. female with the following history as recorded in EpicCare:  Patient Active Problem List   Diagnosis Date Noted  . Anxiety 08/22/2018  . Contact dermatitis and eczema 06/14/2018  . Constipation 11/11/2016  . Low back pain 11/10/2016  . Leg abrasion 08/12/2016  . Contusion of right knee 08/12/2016  . Sinusitis, chronic 08/06/2016  . Rash and nonspecific skin eruption 04/20/2016  . Aortic valve sclerosis 03/12/2015  . Contusion of left chest wall 01/09/2015  . Fatigue 09/08/2014  . Postherpetic neuralgia 10/02/2013  . Impacted cerumen of right ear 07/16/2013  . Headache 07/10/2013  . Aortic stenosis 04/19/2013  . Insomnia 01/24/2013  . Cough 08/07/2012  . URI (upper respiratory infection) 08/03/2012  . Pruritus of skin 03/11/2012  . MVP (mitral valve prolapse)   . Multiple thyroid nodules 10/04/2011  . Herpes zoster 09/07/2011  . Essential hypertension, benign 07/27/2011  . Atrophic vaginitis   . Well adult exam 03/15/2011  . Hyperlipemia, mixed 03/15/2011  . OVERACTIVE BLADDER 09/02/2010  . Hypothyroidism 03/03/2010  . CALF PAIN, LEFT 10/27/2009  . FLATULENCE-GAS-BLOATING 07/21/2009  . EAR PAIN 12/19/2008  . Allergic rhinitis 12/19/2008  . Irritable bowel syndrome 09/04/2008  . PERSONAL HX COLONIC POLYPS 09/04/2008  . GASTRITIS, CHRONIC 09/03/2008  . DUODENITIS, WITHOUT HEMORRHAGE 09/03/2008  . TIBIALIS TENDINITIS 03/20/2008  . OSTEOARTHRITIS 02/25/2008  . SWEATING 02/21/2008  . LACTOSE INTOLERANCE 11/20/2007  . Dyslipidemia 11/20/2007  . GERD 11/20/2007  . OSTEOPOROSIS 11/20/2007    Current Outpatient Medications  Medication Sig Dispense Refill  . albuterol (PROVENTIL HFA;VENTOLIN HFA) 108 (90 BASE) MCG/ACT inhaler Inhale 2 puffs into the lungs every 6 (six) hours as needed for wheezing. 1 Inhaler 3  . amoxicillin (AMOXIL) 500 MG capsule Reported on 06/11/2016    . azelastine (ASTELIN) 0.1 % nasal spray Place into both nostrils at bedtime.  Use in each nostril as directed    . Azelastine HCl 0.15 % SOLN     . Calcium Carbonate-Vitamin D (CALCIUM-VITAMIN D) 500-200 MG-UNIT per tablet Take 3 tablets by mouth daily.     . Cholecalciferol 4000 units TABS Take 1 tablet by mouth daily.    [name] desonide (DESOWEN) 0.05 % cream Apply topically 2 (two) times daily. 30 g 0  . dexlansoprazole (DEXILANT) 60 MG capsule TAKE (1) CAPSULE DAILY. 30 capsule 3  . diclofenac sodium (VOLTAREN) 1 % GEL APPLY 4 GRAMS 4 TIMES DAILY. 100 g 3  . EPINEPHrine (EPIPEN 2-PAK) 0.3 mg/0.3 mL IJ SOAJ injection Inject 0.3 mLs (0.3 mg total) into the muscle as needed. 1 Device 1  . ipratropium (ATROVENT) 0.06 % nasal spray Place 1 spray into the nose 3 (three) times daily as needed for rhinitis. (Patient taking differently: Place 1 spray into the nose 2 (two) times daily. ) 15 mL 0  . levothyroxine (SYNTHROID) 50 MCG tablet TAKE 1 TABLET ONCE DAILY BEFORE BREAKFAST. 30 tablet 11  . LORazepam (ATIVAN) 1 MG tablet TAKE 1 OR 2 TABLETS TWICE A DAY AS NEEDED FOR ANXIETY. 100 tablet 2  . mometasone (NASONEX) 50 MCG/ACT nasal spray Place 2 sprays into the nose every morning.     . montelukast (SINGULAIR) 10 MG tablet Take 10 mg by mouth at bedtime.     [name] nystatin-triamcinolone ointment (MYCOLOG) Apply 1 application topically 2 (two) times daily. (Patient taking differently: Apply 1 application topically as needed. ) 30 g 1  . SALINE NASAL SPRAY NA Place into the nose.    . simvastatin (  ZOCOR) 40 MG tablet TAKE 1 TABLET ONCE DAILY. 90 tablet 1  . VESICARE 10 MG tablet Take 1 tablet by mouth as needed.     [name] azithromycin (ZITHROMAX Z-PAK) 250 MG tablet As directed (Patient not taking: Reported on 12/25/2018) 6 tablet 0  . budesonide-formoterol (SYMBICORT) 160-4.5 MCG/ACT inhaler Inhale 2 puffs 2 (two) times daily into the lungs. 1 Inhaler 3  . methylPREDNISolone (MEDROL DOSEPAK) 4 MG TBPK tablet As directed (Patient not taking: Reported on 12/25/2018) 21 tablet 0   No current  facility-administered medications for this visit.     Allergies: Bee venom; Doxycycline; and Neosporin [neomycin-bacitracin zn-polymyx]  Past Medical History:  Diagnosis Date  . Allergic rhinitis   . Atrophic vaginitis   . Baker's cyst    Left-Dr. Aluisio  . Cataract    Dr. Katy Fitch  . Colon polyps   . Fibroid   . Gastritis    chronic  . GERD (gastroesophageal reflux disease)   . Heart murmur   . Hemorrhoids   . Hyperlipidemia   . IBS (irritable bowel syndrome)   . MVP (mitral valve prolapse)    Antibiotics required for dental procedures  . OA (osteoarthritis)   . Osteoporosis 06/2018   T score -2.2 stable on Prolia  . Overactive bladder   . Thyroid disease   . Thyroid nodule   . Torn meniscus    bilateral    Past Surgical History:  Procedure Laterality Date  . CATARACT EXTRACTION, BILATERAL    . TONSILLECTOMY      Family History  Problem Relation Age of Onset  . Heart disease Mother   . Hypertension Mother   . Osteoporosis Mother   . Congestive Heart Failure Mother   . Pancreatic cancer Father   . Heart attack Brother   . Colon cancer Neg Hx   . Colon polyps Neg Hx   . Rectal cancer Neg Hx   . Stomach cancer Neg Hx     Social History   Tobacco Use  . Smoking status: Former Research scientist (life sciences)  . Smokeless tobacco: Never Used  Substance Use Topics  . Alcohol use: Yes    Alcohol/week: 5.0 standard drinks    Types: 5 Standard drinks or equivalent per week    Comment: Socially     Subjective:  [name] is here today requesting evaluation of fall, lightheadedness.  She was walking into bathroom last night, tripped and fell onto her back. She denies syncope or head injury during fall, she was able to get back up and felt okay, went to bed. She then woke this morning with pain in her left ribcage and left shoulder with bruising to both areas. She took tylenol and pain improved, but throughout the day shes felt lightheaded, and noticed headaches, excessive thirst and dry  mouth, decreased appetite, increased urination. She also noticed her BP was a little elevated at home today when she checked. She has started using CBD about 2 weeks ago for arthritis pain , but no other recent new medications/changes.  BP Readings from Last 3 Encounters:  12/25/18 (!) 150/70  12/20/18 134/78  11/10/18 122/70    Review of Systems  Constitutional: Positive for chills. Negative for fever.  HENT: Negative for hearing loss.   Eyes: Negative for blurred vision and double vision.  Respiratory: Negative for cough and shortness of breath.   Cardiovascular: Negative for chest pain, palpitations and leg swelling.  Gastrointestinal: Negative for abdominal pain, constipation, diarrhea, nausea and vomiting.  Genitourinary: Positive  for frequency. Negative for dysuria, hematuria and urgency.  Musculoskeletal: Positive for falls.  Neurological: Positive for dizziness and headaches. Negative for tingling, speech change, focal weakness, loss of consciousness and weakness.  Psychiatric/Behavioral: Negative for memory loss.    Objective:  Vitals:   12/25/18 1504  BP: (!) 150/70  Pulse: 80  Temp: 98.3 F (36.8 C)  TempSrc: Oral  SpO2: 97%  Height: 5\' 2"  (1.575 m)    General: Well developed, well nourished, in no acute distress  Skin : Warm and dry.  Head: Normocephalic and atraumatic  Eyes: Sclera and conjunctiva clear; pupils round and reactive to light; extraocular movements intact  Ears: External normal; canals clear; tympanic membranes normal  Oropharynx: Pink, supple. No suspicious lesions  Neck: Supple without thyromegaly, adenopathy  Lungs: Respirations unlabored; clear to auscultation bilaterally without wheeze, rales, rhonchi  CVS exam: normal rate and regular rhythm, S1 and S2 normal.  Abdomen: Soft; nontender; nondistended;  no masses or hepatosplenomegaly  Musculoskeletal: No deformities; no active joint inflammation  Extremities: No edema, cyanosis, clubbing   Vessels: Symmetric bilaterally  Neurologic: Alert and oriented; speech intact; face symmetrical; moves all extremities well; CNII-XII intact without focal deficit  Psychiatric: Normal mood and affect.   Assessment:  1. Fall, sequela   2. Lightheadedness   3. Acute pain of left shoulder   4. Rib pain   5. Urinary frequency   6. Nonintractable headache, unspecified chronicity pattern, unspecified headache type     Plan:   11/10/18 TSH, B12, vitamin D WNL EKG today sinus rhythm WNL, no acute abnormalities Will check labs/imaging today for further evaluation Home management, red flags and strict return precautions including when to seek immediate/emergency care discussed and printed on AVS   No follow-ups on file.  Orders Placed This Encounter  Procedures  . CULTURE, URINE COMPREHENSIVE    Standing Status:   Future    Standing Expiration Date:   01/25/2019  . DG Ribs Unilateral Left    Standing Status:   Future    Standing Expiration Date:   02/23/2020    Order Specific Question:   Reason for Exam (SYMPTOM  OR DIAGNOSIS REQUIRED)    Answer:   fall pain bruising    Order Specific Question:   Preferred imaging location?    Answer:   [facility]    Order Specific Question:   Radiology Contrast Protocol - do NOT remove file path    Answer:   \\[email or web address].[email or web address]  . DG Shoulder Left    Standing Status:   Future    Standing Expiration Date:   02/23/2020    Order Specific Question:   Reason for Exam (SYMPTOM  OR DIAGNOSIS REQUIRED)    Answer:   fall pain bruising    Order Specific Question:   Preferred imaging location?    Answer:   [facility]    Order Specific Question:   Radiology Contrast Protocol - do NOT remove file path    Answer:   \\[email or web address].[email or web address]  . CT Head Wo Contrast    Standing Status:   Future    Standing Expiration Date:   03/25/2020    Order Specific Question:   ** REASON FOR  EXAM (FREE TEXT)    Answer:   fall without known head injury followed by headaches and lightheadedness    Order Specific Question:   Preferred imaging location?    Answer:   [facility]    Order Specific Question:   Radiology Contrast Protocol -  do NOT remove file path    Answer:   \\[email or web address].[email or web address]  . CBC    Standing Status:   Future    Standing Expiration Date:   12/26/2019  . Comprehensive metabolic panel    Standing Status:   Future    Standing Expiration Date:   12/26/2019  . Urinalysis, Routine w reflex microscopic    Standing Status:   Future    Standing Expiration Date:   12/25/2019  . EKG 12-Lead    Order Specific Question:   Where should this test be performed?    Answer:   Other    Requested Prescriptions    No prescriptions requested or ordered in this encounter

## 2018-12-26 ENCOUNTER — Encounter: Payer: Self-pay | Admitting: Nurse Practitioner

## 2018-12-26 ENCOUNTER — Other Ambulatory Visit: Payer: Self-pay | Admitting: Internal Medicine

## 2018-12-26 ENCOUNTER — Ambulatory Visit (INDEPENDENT_AMBULATORY_CARE_PROVIDER_SITE_OTHER): Payer: Medicare Other

## 2018-12-26 ENCOUNTER — Telehealth: Payer: Self-pay | Admitting: *Deleted

## 2018-12-26 ENCOUNTER — Encounter

## 2018-12-26 ENCOUNTER — Encounter: Payer: Self-pay | Admitting: Family Medicine

## 2018-12-26 ENCOUNTER — Ambulatory Visit (INDEPENDENT_AMBULATORY_CARE_PROVIDER_SITE_OTHER): Payer: Medicare Other | Admitting: Family Medicine

## 2018-12-26 VITALS — BP 140/74 | HR 86 | Temp 97.4°F | Ht 62.0 in

## 2018-12-26 DIAGNOSIS — M25512 Pain in left shoulder: Secondary | ICD-10-CM

## 2018-12-26 DIAGNOSIS — M542 Cervicalgia: Secondary | ICD-10-CM

## 2018-12-26 HISTORY — DX: Cervicalgia: M54.2

## 2018-12-26 NOTE — Telephone Encounter (Signed)
I think she is requesting 12/25/18 labs and xray results. Looks like CT scan is scheduled for 12/27/18.  Copied from [place] [id number]. Topic: General - Other >> Dec 26, 2018 10:06 AM [name] wrote:  Pt called about her lab results and CT scan

## 2018-12-26 NOTE — Assessment & Plan Note (Signed)
Has little to no pain over the shoulder or the [facility] joint.  X-ray was suggestive of a small avulsion fracture.  No significant ecchymosis in this area.  Has good range of motion. -Counseled on supportive care. -Provided samples of Pennsaid.

## 2018-12-26 NOTE — Patient Instructions (Signed)
Nice to meet you  Please try heat and massage on the neck  Please try to keep compression on the ribs  Please try the rub on medicine for pain  You can also take tylenol for pain  I will call you with the results from today  Please see me back in 2-3 weeks if no better  Have fun on your trip.

## 2018-12-26 NOTE — Progress Notes (Signed)
[name] - 83 y.o. female MRN [id number]  Date of birth: [birth year]  SUBJECTIVE:  Including CC & ROS.  Chief Complaint  Patient presents with  . Pain    left shoulder pain/ xray showed left shoulder avulsion tear/ 3 days ago fell    [name] is a 83 y.o. female that is presenting with left shoulder pain, neck pain and left rib pain.  She was seen yesterday and had x-rays taken.  She is having significant bruising over the mid axillary line of the lower ribs.  This is where most of her pain is occurring.  She denies any significant pain in the left shoulder.  She denies any loss of range of motion.  She is not having any shortness of breath.  She has had weeks of left-sided neck and trapezius pain.  She fell on Sunday.  She fell onto the marble floor.  Has been using CBD oil to help with the pain.  Independent review of the left shoulder x-ray from 1/27 shows a small possible avulsion fracture next to the [facility] joint.  Independent review of the left rib x-ray from 1/27 shows nondisplaced left lower rib fracture.   Review of Systems  Constitutional: Negative for fever.  HENT: Negative for congestion.   Respiratory: Negative for cough.   Cardiovascular: Negative for chest pain.  Gastrointestinal: Negative for abdominal pain.  Musculoskeletal: Positive for arthralgias.  Skin: Positive for color change.  Neurological: Negative for weakness.  Hematological: Negative for adenopathy.  Psychiatric/Behavioral: Negative for agitation.    HISTORY: Past Medical, Surgical, Social, and Family History Reviewed & Updated per EMR.   Pertinent Historical Findings include:  Past Medical History:  Diagnosis Date  . Allergic rhinitis   . Atrophic vaginitis   . Baker's cyst    Left-Dr. Aluisio  . Cataract    Dr. Katy Fitch  . Colon polyps   . Fibroid   . Gastritis    chronic  . GERD (gastroesophageal reflux disease)   . Heart murmur   . Hemorrhoids   . Hyperlipidemia   . IBS (irritable bowel  syndrome)   . MVP (mitral valve prolapse)    Antibiotics required for dental procedures  . OA (osteoarthritis)   . Osteoporosis 06/2018   T score -2.2 stable on Prolia  . Overactive bladder   . Thyroid disease   . Thyroid nodule   . Torn meniscus    bilateral    Past Surgical History:  Procedure Laterality Date  . CATARACT EXTRACTION, BILATERAL    . TONSILLECTOMY      Allergies  Allergen Reactions  . Bee Venom Itching, Swelling and Rash    Itching, swelling and rash with bee stings, patient has epi pen  . Doxycycline     ??  . Neosporin [Neomycin-Bacitracin Zn-Polymyx]     Family History  Problem Relation Age of Onset  . Heart disease Mother   . Hypertension Mother   . Osteoporosis Mother   . Congestive Heart Failure Mother   . Pancreatic cancer Father   . Heart attack Brother   . Colon cancer Neg Hx   . Colon polyps Neg Hx   . Rectal cancer Neg Hx   . Stomach cancer Neg Hx      Social History   Socioeconomic History  . Marital status: Married    Spouse name: Not on file  . Number of children: Not on file  . Years of education: Not on file  . Highest education level: Not  on file  Occupational History  . Occupation: retired    Fish farm manager: RETIRED  Social Needs  . Financial resource strain: Not on file  . Food insecurity:    Worry: Not on file    Inability: Not on file  . Transportation needs:    Medical: Not on file    Non-medical: Not on file  Tobacco Use  . Smoking status: Former Research scientist (life sciences)  . Smokeless tobacco: Never Used  Substance and Sexual Activity  . Alcohol use: Yes    Alcohol/week: 5.0 standard drinks    Types: 5 Standard drinks or equivalent per week    Comment: Socially  . Drug use: No  . Sexual activity: Yes    Birth control/protection: Post-menopausal    Comment: 1st intercourse 83 yo-Fewer than 5 partners  Lifestyle  . Physical activity:    Days per week: Not on file    Minutes per session: Not on file  . Stress: Not on file    Relationships  . Social connections:    Talks on phone: Not on file    Gets together: Not on file    Attends religious service: Not on file    Active member of club or organization: Not on file    Attends meetings of clubs or organizations: Not on file    Relationship status: Not on file  . Intimate partner violence:    Fear of current or ex partner: Not on file    Emotionally abused: Not on file    Physically abused: Not on file    Forced sexual activity: Not on file  Other Topics Concern  . Not on file  Social History Narrative   Regular exercise-yes   Daily caffeine use     PHYSICAL EXAM:  VS: BP 140/74   Pulse 86   Temp (!) 97.4 F (36.3 C) (Oral)   Ht 5\' 2"  (1.575 m)   SpO2 95%   BMI 22.86 kg/m  Physical Exam Gen: NAD, alert, cooperative with exam, well-appearing ENT: normal lips, normal nasal mucosa,  Eye: normal EOM, normal conjunctiva and lids CV:  no edema, +2 pedal pulses   Resp: no accessory muscle use, non-labored,  Skin: no rashes, no areas of induration  Neuro: normal tone, normal sensation to touch Psych:  normal insight, alert and oriented MSK:  Left shoulder: Normal active flexion and abduction. No tenderness to palpation over the [facility] joint. Normal strength resistance with internal and external rotation. Normal empty can testing. Normal grip strength. Neck: Some tenderness to palpation over the paracervical spinal muscles. No tenderness to palpation of the midline cervical spine. No step-offs appreciated Some tenderness to palpation of the left trapezius. Left ribs:  Significant ecchymosis over the mid axillary line on the rib segments. Tenderness to palpation in this area. Neurovascular intact     ASSESSMENT & PLAN:   Acute pain of left shoulder Has little to no pain over the shoulder or the [facility] joint.  X-ray was suggestive of a small avulsion fracture.  No significant ecchymosis in this area.  Has good range of motion. -Counseled on  supportive care. -Provided samples of Pennsaid.   Neck pain Likely muscular in nature.  Does have a history of osteoporosis. -X-ray -Counseled on home exercise therapy and supportive care. -If no improvement can consider physical therapy.

## 2018-12-26 NOTE — Assessment & Plan Note (Signed)
Likely muscular in nature.  Does have a history of osteoporosis. -X-ray -Counseled on home exercise therapy and supportive care. -If no improvement can consider physical therapy.

## 2018-12-26 NOTE — Telephone Encounter (Signed)
Pt informed of 12/25/18 xray results. She is scheduled with Dr. Raeford Razor today 12/26/18 for further eval of left shoulder pain/avulsion per Jodi Mourning, NP. I also informed her the urine cx is still pending and per Mickel Baas nothing concerning with other labs from 12/25/18 other than low sodium. I advised her to return to lab in 1 week for recheck. She states she is going out of town on 01/01/19. She would like to know if she can come for repeat BMET on 12/29/18. Please advise.

## 2018-12-27 ENCOUNTER — Other Ambulatory Visit: Payer: Self-pay | Admitting: Nurse Practitioner

## 2018-12-27 ENCOUNTER — Ambulatory Visit (INDEPENDENT_AMBULATORY_CARE_PROVIDER_SITE_OTHER)
Admission: RE | Admit: 2018-12-27 | Discharge: 2018-12-27 | Disposition: A | Discharge status: Home / Self Care | Payer: Medicare Other | Source: Ambulatory Visit | Attending: Nurse Practitioner | Admitting: Nurse Practitioner

## 2018-12-27 ENCOUNTER — Telehealth: Payer: Self-pay | Admitting: Family Medicine

## 2018-12-27 DIAGNOSIS — R519 Headache, unspecified: Secondary | ICD-10-CM

## 2018-12-27 DIAGNOSIS — S0990XA Unspecified injury of head, initial encounter: Secondary | ICD-10-CM | POA: Diagnosis not present

## 2018-12-27 DIAGNOSIS — R51 Headache: Secondary | ICD-10-CM | POA: Diagnosis not present

## 2018-12-27 DIAGNOSIS — R9389 Abnormal findings on diagnostic imaging of other specified body structures: Secondary | ICD-10-CM

## 2018-12-27 DIAGNOSIS — E871 Hypo-osmolality and hyponatremia: Secondary | ICD-10-CM

## 2018-12-27 DIAGNOSIS — D329 Benign neoplasm of meninges, unspecified: Secondary | ICD-10-CM

## 2018-12-27 DIAGNOSIS — R42 Dizziness and giddiness: Secondary | ICD-10-CM

## 2018-12-27 DIAGNOSIS — W19XXXS Unspecified fall, sequela: Secondary | ICD-10-CM

## 2018-12-27 LAB — CULTURE, URINE COMPREHENSIVE
MICRO NUMBER:: 108715
RESULT:: NO GROWTH
SPECIMEN QUALITY:: ADEQUATE

## 2018-12-27 NOTE — Telephone Encounter (Signed)
Response sent in result note

## 2018-12-27 NOTE — Telephone Encounter (Signed)
Left VM for patient. If she calls back please have her speak with a nurse/CMA and inform that her xray doesn't show a fracture but does demonstrate significant degenerative changes. The [facility] can report results to patient.   If any questions then please take the best time and phone number to call and I will try to call her back.   Rosemarie Ax, MD [facility] 12/27/2018, 12:50 PM

## 2018-12-27 NOTE — Telephone Encounter (Signed)
Pt given lab results per notes of Dr. Raeford Razor on 12/27/18. Pt verbalized understanding.

## 2018-12-28 ENCOUNTER — Other Ambulatory Visit: Payer: Self-pay | Admitting: Internal Medicine

## 2018-12-28 DIAGNOSIS — D329 Benign neoplasm of meninges, unspecified: Secondary | ICD-10-CM

## 2018-12-28 NOTE — Progress Notes (Signed)
I spoke w/Edin

## 2018-12-29 ENCOUNTER — Encounter: Payer: Self-pay | Admitting: Family Medicine

## 2018-12-29 ENCOUNTER — Ambulatory Visit (INDEPENDENT_AMBULATORY_CARE_PROVIDER_SITE_OTHER): Payer: Medicare Other | Admitting: Family Medicine

## 2018-12-29 DIAGNOSIS — M542 Cervicalgia: Secondary | ICD-10-CM

## 2018-12-29 NOTE — Patient Instructions (Signed)
Good to see you  Please try heat then stretch and then ice  Take tylenol 650 mg three times a day is the best evidence based medicine we have for arthritis.  Glucosamine sulfate 750mg  twice a day is a supplement that has been shown to help moderate to severe arthritis. Vitamin D 2000 IU daily Fish oil 2 grams daily.  Tumeric 500mg  twice daily.  Capsaicin topically up to four times a day may also help with pain. Please see me back in 2-3 weeks if your pain doesn't improve

## 2018-12-29 NOTE — Assessment & Plan Note (Signed)
Pain is worsening. No improvement with modalities to date. Has severe degenerative changes in multiple levels of the cervical spine.  - trigger point injections  - counseled on HEP and supportive care - if no improvement consider facet injections or PT.

## 2018-12-29 NOTE — Progress Notes (Signed)
[name] - 83 y.o. female MRN [id number]  Date of birth: 1936-08-12  SUBJECTIVE:  Including CC & ROS.  Chief Complaint  Patient presents with  . Neck Pain    [name] is a 83 y.o. female that is  Presenting with worsening neck pain. The pain is ongoing since her fall about one week ago. Pain is located in the posterior neck. No radicular symptoms. Pain is severe. Has been with moving her neck in any direction. Pain is sharp and stabbing. No improvement with pennsaid. Has been taking tramadol for the pain.  Independent review of her cervical spine x-ray from 1/28 shows severe multilevel degenerative changes of the cervical spine.   Review of Systems  Constitutional: Negative for fever.  HENT: Negative for congestion.   Respiratory: Negative for cough.   Cardiovascular: Negative for chest pain.  Gastrointestinal: Negative for abdominal pain.  Musculoskeletal: Positive for arthralgias and neck pain.  Skin: Negative for color change.  Neurological: Negative for weakness.  Hematological: Negative for adenopathy.  Psychiatric/Behavioral: Negative for agitation.    HISTORY: Past Medical, Surgical, Social, and Family History Reviewed & Updated per EMR.   Pertinent Historical Findings include:  Past Medical History:  Diagnosis Date  . Allergic rhinitis   . Atrophic vaginitis   . Baker's cyst    Left-Dr. Aluisio  . Cataract    Dr. Katy Fitch  . Colon polyps   . Fibroid   . Gastritis    chronic  . GERD (gastroesophageal reflux disease)   . Heart murmur   . Hemorrhoids   . Hyperlipidemia   . IBS (irritable bowel syndrome)   . MVP (mitral valve prolapse)    Antibiotics required for dental procedures  . OA (osteoarthritis)   . Osteoporosis 06/2018   T score -2.2 stable on Prolia  . Overactive bladder   . Thyroid disease   . Thyroid nodule   . Torn meniscus    bilateral    Past Surgical History:  Procedure Laterality Date  . CATARACT EXTRACTION, BILATERAL    .  TONSILLECTOMY      Allergies  Allergen Reactions  . Bee Venom Itching, Swelling and Rash    Itching, swelling and rash with bee stings, patient has epi pen  . Doxycycline     ??  . Neosporin [Neomycin-Bacitracin Zn-Polymyx]     Family History  Problem Relation Age of Onset  . Heart disease Mother   . Hypertension Mother   . Osteoporosis Mother   . Congestive Heart Failure Mother   . Pancreatic cancer Father   . Heart attack Brother   . Colon cancer Neg Hx   . Colon polyps Neg Hx   . Rectal cancer Neg Hx   . Stomach cancer Neg Hx      Social History   Socioeconomic History  . Marital status: Married    Spouse name: Not on file  . Number of children: Not on file  . Years of education: Not on file  . Highest education level: Not on file  Occupational History  . Occupation: retired    Fish farm manager: RETIRED  Social Needs  . Financial resource strain: Not on file  . Food insecurity:    Worry: Not on file    Inability: Not on file  . Transportation needs:    Medical: Not on file    Non-medical: Not on file  Tobacco Use  . Smoking status: Former Research scientist (life sciences)  . Smokeless tobacco: Never Used  Substance and Sexual Activity  .  Alcohol use: Yes    Alcohol/week: 5.0 standard drinks    Types: 5 Standard drinks or equivalent per week    Comment: Socially  . Drug use: No  . Sexual activity: Yes    Birth control/protection: Post-menopausal    Comment: 1st intercourse 83 yo-Fewer than 5 partners  Lifestyle  . Physical activity:    Days per week: Not on file    Minutes per session: Not on file  . Stress: Not on file  Relationships  . Social connections:    Talks on phone: Not on file    Gets together: Not on file    Attends religious service: Not on file    Active member of club or organization: Not on file    Attends meetings of clubs or organizations: Not on file    Relationship status: Not on file  . Intimate partner violence:    Fear of current or ex partner: Not on  file    Emotionally abused: Not on file    Physically abused: Not on file    Forced sexual activity: Not on file  Other Topics Concern  . Not on file  Social History Narrative   Regular exercise-yes   Daily caffeine use     PHYSICAL EXAM:  VS: BP 128/84   Pulse (!) 102   Ht 5\' 2"  (1.575 m)   SpO2 96%   BMI 22.86 kg/m  Physical Exam Gen: NAD, alert, cooperative with exam, well-appearing ENT: normal lips, normal nasal mucosa,  Eye: normal EOM, normal conjunctiva and lids CV:  no edema, +2 pedal pulses   Resp: no accessory muscle use, non-labored,  GI: no masses or tenderness, no hernia  Skin: no rashes, no areas of induration  Neuro: normal tone, normal sensation to touch Psych:  normal insight, alert and oriented MSK:  Neck:  TTP along the left and right paraspinal cervical neck muscles  Limited flexion and extension, lateral rotation left and right  Normal strength to resistance with shrug  Normal shoulder ROM  Normal grip strength  Neurovascularly intact    Aspiration/Injection Procedure Note [name] 10/01/1936  Procedure: Injection Indications: neck pain   Procedure Details Consent: Risks of procedure as well as the alternatives and risks of each were explained to the (patient/caregiver).  Consent for procedure obtained. Time Out: Verified patient identification, verified procedure, site/side was marked, verified correct patient position, special equipment/implants available, medications/allergies/relevent history reviewed, required imaging and test results available.  Performed.  The area was cleaned with iodine and alcohol swabs.    The left and right splenius trigger points was injected using 1 cc's of 40 mg Depo-Medrol and 5 cc's of 0.5% bupivacaine with a 25 1" needle.     A sterile dressing was applied.  Patient did tolerate procedure well.        ASSESSMENT & PLAN:   Neck pain Pain is worsening. No improvement with modalities to date. Has  severe degenerative changes in multiple levels of the cervical spine.  - trigger point injections  - counseled on HEP and supportive care - if no improvement consider facet injections or PT.

## 2019-01-01 ENCOUNTER — Other Ambulatory Visit (INDEPENDENT_AMBULATORY_CARE_PROVIDER_SITE_OTHER): Payer: Medicare Other

## 2019-01-01 DIAGNOSIS — E871 Hypo-osmolality and hyponatremia: Secondary | ICD-10-CM | POA: Diagnosis not present

## 2019-01-01 LAB — BASIC METABOLIC PANEL
BUN: 15 mg/dL (ref 6–23)
CO2: 29 mEq/L (ref 19–32)
Calcium: 9.5 mg/dL (ref 8.4–10.5)
Chloride: 98 mEq/L (ref 96–112)
Creatinine, Ser: 0.64 mg/dL (ref 0.40–1.20)
GFR: 89.17 mL/min (ref >60.00)
Glucose, Bld: 85 mg/dL (ref 70–99)
Potassium: 4.7 mEq/L (ref 3.5–5.1)
Sodium: 134 mEq/L — ABNORMAL LOW (ref 135–145)

## 2019-01-03 ENCOUNTER — Telehealth: Payer: Self-pay | Admitting: Internal Medicine

## 2019-01-03 ENCOUNTER — Ambulatory Visit: Payer: Self-pay

## 2019-01-03 ENCOUNTER — Ambulatory Visit (INDEPENDENT_AMBULATORY_CARE_PROVIDER_SITE_OTHER): Payer: Medicare Other | Admitting: Family Medicine

## 2019-01-03 ENCOUNTER — Ambulatory Visit: Payer: Self-pay | Admitting: Family Medicine

## 2019-01-03 ENCOUNTER — Encounter: Payer: Self-pay | Admitting: Family Medicine

## 2019-01-03 DIAGNOSIS — M542 Cervicalgia: Secondary | ICD-10-CM | POA: Diagnosis not present

## 2019-01-03 MED ORDER — PREDNISONE 5 MG PO TABS: ORAL_TABLET | ORAL | 0 refills | Status: DC

## 2019-01-03 NOTE — Telephone Encounter (Signed)
Patient came by the office today and dropped off a form to be completed for a refund for a cruise that they were not able to go on. Patient would like a call when it is ready to be picked up. Placed in Brittany's box for completion.

## 2019-01-03 NOTE — Progress Notes (Signed)
[name] - 83 y.o. female MRN [id number]  Date of birth: 08/28/1936  SUBJECTIVE:  Including CC & ROS.  Chief Complaint  Patient presents with  . Pain    neck pain, ribs better, pt states chiropractor helps some    [name] is a 83 y.o. female that is  Presenting with worsening of her neck pain. Hasn't had any improvement with medications and injections. Has been performing the home exercises and has mild pain.  Has been to the chiropractor and receiving massages.  Pain is still localized to the posterior aspect of the neck on each side.  Pain is sharp and stabbing.  Pain is constant and severe.  Denies any radicular symptoms..   Review of Systems  Constitutional: Negative for fever.  HENT: Negative for congestion.   Respiratory: Negative for cough.   Cardiovascular: Negative for chest pain.  Gastrointestinal: Negative for abdominal pain.  Musculoskeletal: Positive for neck pain.  Skin: Negative for color change.  Neurological: Negative for weakness.  Hematological: Negative for adenopathy.  Psychiatric/Behavioral: Negative for agitation.    HISTORY: Past Medical, Surgical, Social, and Family History Reviewed & Updated per EMR.   Pertinent Historical Findings include:  Past Medical History:  Diagnosis Date  . Allergic rhinitis   . Atrophic vaginitis   . Baker's cyst    Left-Dr. Aluisio  . Cataract    Dr. Katy Fitch  . Colon polyps   . Fibroid   . Gastritis    chronic  . GERD (gastroesophageal reflux disease)   . Heart murmur   . Hemorrhoids   . Hyperlipidemia   . IBS (irritable bowel syndrome)   . MVP (mitral valve prolapse)    Antibiotics required for dental procedures  . OA (osteoarthritis)   . Osteoporosis 06/2018   T score -2.2 stable on Prolia  . Overactive bladder   . Thyroid disease   . Thyroid nodule   . Torn meniscus    bilateral    Past Surgical History:  Procedure Laterality Date  . CATARACT EXTRACTION, BILATERAL    . TONSILLECTOMY       Allergies  Allergen Reactions  . Bee Venom Itching, Swelling and Rash    Itching, swelling and rash with bee stings, patient has epi pen  . Doxycycline     ??  . Neosporin [Neomycin-Bacitracin Zn-Polymyx]     Family History  Problem Relation Age of Onset  . Heart disease Mother   . Hypertension Mother   . Osteoporosis Mother   . Congestive Heart Failure Mother   . Pancreatic cancer Father   . Heart attack Brother   . Colon cancer Neg Hx   . Colon polyps Neg Hx   . Rectal cancer Neg Hx   . Stomach cancer Neg Hx      Social History   Socioeconomic History  . Marital status: Married    Spouse name: Not on file  . Number of children: Not on file  . Years of education: Not on file  . Highest education level: Not on file  Occupational History  . Occupation: retired    Fish farm manager: RETIRED  Social Needs  . Financial resource strain: Not on file  . Food insecurity:    Worry: Not on file    Inability: Not on file  . Transportation needs:    Medical: Not on file    Non-medical: Not on file  Tobacco Use  . Smoking status: Former Research scientist (life sciences)  . Smokeless tobacco: Never Used  Substance and Sexual  Activity  . Alcohol use: Yes    Alcohol/week: 5.0 standard drinks    Types: 5 Standard drinks or equivalent per week    Comment: Socially  . Drug use: No  . Sexual activity: Yes    Birth control/protection: Post-menopausal    Comment: 1st intercourse 83 yo-Fewer than 5 partners  Lifestyle  . Physical activity:    Days per week: Not on file    Minutes per session: Not on file  . Stress: Not on file  Relationships  . Social connections:    Talks on phone: Not on file    Gets together: Not on file    Attends religious service: Not on file    Active member of club or organization: Not on file    Attends meetings of clubs or organizations: Not on file    Relationship status: Not on file  . Intimate partner violence:    Fear of current or ex partner: Not on file     Emotionally abused: Not on file    Physically abused: Not on file    Forced sexual activity: Not on file  Other Topics Concern  . Not on file  Social History Narrative   Regular exercise-yes   Daily caffeine use     PHYSICAL EXAM:  VS: BP 118/70   Pulse 89   Temp 97.7 F (36.5 C) (Oral)   Ht 5\' 2"  (1.575 m)   SpO2 95%   BMI 22.86 kg/m  Physical Exam Gen: NAD, alert, cooperative with exam, well-appearing ENT: normal lips, normal nasal mucosa,  Eye: normal EOM, normal conjunctiva and lids CV:  no edema, +2 pedal pulses   Resp: no accessory muscle use, non-labored,  Skin: no rashes, no areas of induration  Neuro: normal tone, normal sensation to touch Psych:  normal insight, alert and oriented MSK:  Neck: Some tenderness to palpation along the paraspinal cervical muscles bilaterally. Normal range of motion. No midline tenderness of the cervical spine. Pain with range of motion to the full extent Normal shoulder range of motion. Normal strength resistance with shrug. Neurovascular intact.      ASSESSMENT & PLAN:   Neck pain Has ongoing pain with limited improvement with modalities tried to date. -Try prednisone. -Counseled on supportive care. -If no improvement would consider baclofen and physical therapy.

## 2019-01-03 NOTE — Telephone Encounter (Signed)
Summary: Medication    Pt stated that Dr. Alain Marion told her to purchase Aspercreme with Lidocaine and rub it on her neck and lower back and to use heat on her neck for certain exercises however the warnings on the cream state do not apply heat to the area of use or bandages. She would like to know what to do so she is properly using the cream. [phone]    Returned patient call and contacted [facility] office where pt was seen today.Spoke with Judson Roch who will route question to Dr Raeford Razor. Pt is aware that Dr Raeford Razor has gone for the day. NT instructed pt to not use heat on top of the Aspercreme with Lidocaine. Pt was instructed to remove Aspercreme with lidocaine with soap and water prior to applying heat to area. Pt verbalized understanding of all instructions.  Reason for Disposition . Caller has NON-URGENT medication question about med that PCP prescribed and triager unable to answer question  Answer Assessment - Initial Assessment Questions 1. SYMPTOMS: "Do you have any symptoms?"     No just a question 2. SEVERITY: If symptoms are present, ask "Are they mild, moderate or severe?"     Summary: Medication    Pt stated that Dr. Alain Marion told her to purchase Aspercreme with Lidocaine and rub it on her neck and lower back and to use heat on her neck for certain exercises however the warnings on the cream state do not apply heat to the area of use or bandages. She would like to know what to do so she is properly using the cream. CB#[phone]  Protocols used: MEDICATION QUESTION CALL-A-AH

## 2019-01-03 NOTE — Patient Instructions (Signed)
Good to see you  Please try the prednisone  Please try the extra strength tylenol 500 mg. You can take two of these three times per day  You can try rubbing Aspercreme with lidocaine on the area. Please try heat on the area Please follow-up with me in a couple weeks.  If no improvement we may need to consider physical therapy.

## 2019-01-03 NOTE — Telephone Encounter (Signed)
Form has been completed & placed in providers box to review and sign.  °

## 2019-01-04 ENCOUNTER — Encounter: Payer: Self-pay | Admitting: Family Medicine

## 2019-01-04 ENCOUNTER — Encounter: Payer: Self-pay | Admitting: Nurse Practitioner

## 2019-01-04 NOTE — Assessment & Plan Note (Signed)
Has ongoing pain with limited improvement with modalities tried to date. -Try prednisone. -Counseled on supportive care. -If no improvement would consider baclofen and physical therapy.

## 2019-01-04 NOTE — Telephone Encounter (Signed)
Spoke with patient about questions.   Rosemarie Ax, MD [place] 01/04/2019, 10:49 AM

## 2019-01-05 DIAGNOSIS — M47812 Spondylosis without myelopathy or radiculopathy, cervical region: Secondary | ICD-10-CM | POA: Diagnosis not present

## 2019-01-05 DIAGNOSIS — D329 Benign neoplasm of meninges, unspecified: Secondary | ICD-10-CM | POA: Diagnosis not present

## 2019-01-05 DIAGNOSIS — M542 Cervicalgia: Secondary | ICD-10-CM | POA: Diagnosis not present

## 2019-01-05 DIAGNOSIS — M5030 Other cervical disc degeneration, unspecified cervical region: Secondary | ICD-10-CM | POA: Diagnosis not present

## 2019-01-05 HISTORY — DX: Spondylosis without myelopathy or radiculopathy, cervical region: M47.812

## 2019-01-08 ENCOUNTER — Encounter: Payer: Self-pay | Admitting: Family Medicine

## 2019-01-08 ENCOUNTER — Telehealth: Payer: Self-pay

## 2019-01-08 NOTE — Telephone Encounter (Signed)
Sent to Dr. Raeford Razor to follow up as she is seeing him and not Dr. Tamala Julian.

## 2019-01-08 NOTE — Telephone Encounter (Signed)
Copied from [place] [id number]. Topic: General - Other >> Jan 05, 2019  2:53 PM Lennox Solders wrote: Reason for CRM: pt saw dr Sherwood Gambler today and has an important  message to relate to dr Tamala Julian or his nurse concerning c -spine etc

## 2019-01-09 DIAGNOSIS — Z0279 Encounter for issue of other medical certificate: Secondary | ICD-10-CM

## 2019-01-09 NOTE — Telephone Encounter (Signed)
Left VM for patient. If she calls back please have her speak with a nurse/CMA and let her know I was wondering what the neurosurgeon said. The [facility] can report results to patient.   If any questions then please take the best time and phone number to call and I will try to call her back.   Rosemarie Ax, MD [facility] 01/09/2019, 8:19 AM

## 2019-01-12 DIAGNOSIS — H1013 Acute atopic conjunctivitis, bilateral: Secondary | ICD-10-CM | POA: Diagnosis not present

## 2019-02-08 DIAGNOSIS — Z411 Encounter for cosmetic surgery: Secondary | ICD-10-CM | POA: Diagnosis not present

## 2019-02-08 DIAGNOSIS — L503 Dermatographic urticaria: Secondary | ICD-10-CM | POA: Diagnosis not present

## 2019-02-08 DIAGNOSIS — L309 Dermatitis, unspecified: Secondary | ICD-10-CM | POA: Diagnosis not present

## 2019-02-08 DIAGNOSIS — Z23 Encounter for immunization: Secondary | ICD-10-CM | POA: Diagnosis not present

## 2019-02-08 DIAGNOSIS — L853 Xerosis cutis: Secondary | ICD-10-CM | POA: Diagnosis not present

## 2019-02-09 ENCOUNTER — Other Ambulatory Visit: Payer: Self-pay | Admitting: Internal Medicine

## 2019-02-13 ENCOUNTER — Telehealth: Payer: Self-pay | Admitting: *Deleted

## 2019-02-13 NOTE — Telephone Encounter (Signed)
Annual exam 04/05/18 TF  Calcium  9.5           Date 01/01/2019  Upcoming dental procedures NO   Prior Authorization needed NO  Pt estimated Cost $0   Due to coronavirus Pt she would like to schedule Prolia 04/04/2019 @ 11:00    Coverage Details: This is a [employer] J Plan and it covers the medicare Part B deductible and 100% of the excess charges

## 2019-02-14 ENCOUNTER — Ambulatory Visit: Payer: Medicare Other | Admitting: Psychology

## 2019-02-15 NOTE — Telephone Encounter (Signed)
Sent other mychart message about this same thing to PCP already

## 2019-02-16 NOTE — Telephone Encounter (Signed)
Similar message sent to PCP

## 2019-02-19 ENCOUNTER — Ambulatory Visit (HOSPITAL_COMMUNITY): Payer: Self-pay | Admitting: Internal Medicine

## 2019-02-19 ENCOUNTER — Ambulatory Visit (HOSPITAL_COMMUNITY): Payer: Medicare Other

## 2019-02-21 NOTE — Telephone Encounter (Signed)
irv notified of his results through his mychart

## 2019-02-26 ENCOUNTER — Other Ambulatory Visit: Payer: Self-pay | Admitting: Internal Medicine

## 2019-02-26 MED ORDER — HYDROXYCHLOROQUINE SULFATE 200 MG PO TABS: ORAL_TABLET | ORAL | 0 refills | Status: DC

## 2019-02-26 NOTE — Progress Notes (Signed)
Hydroxychloroquine request --   It has been used in high risk patient groups affected by coronavirus symptoms like high fever, body aches, cough shortness of breath, etc. for treatment. Are you willing to accept potential risks and side effects of these medicines? - yes

## 2019-03-01 ENCOUNTER — Other Ambulatory Visit: Payer: Self-pay

## 2019-03-01 MED ORDER — MONTELUKAST SODIUM 10 MG PO TABS: 10.0000 mg | ORAL_TABLET | Freq: Every day | ORAL | 1 refills | Status: DC

## 2019-03-01 NOTE — Telephone Encounter (Signed)
CE with TF 04/10/19

## 2019-03-12 ENCOUNTER — Telehealth: Payer: Self-pay | Admitting: Internal Medicine

## 2019-03-12 NOTE — Telephone Encounter (Signed)
Copied from [place] [id number]. Topic: Quick Communication - See Telephone Encounter >> Mar 12, 2019  2:01 PM Blase Mess A wrote: CRM for notification. See Telephone encounter for: 03/12/19.  Patient is calling because she would like blood work on 03/21/19 Please advise. She states that she has been taking a lot of tylenlol. Thank you [phone] (M)

## 2019-03-13 NOTE — Telephone Encounter (Signed)
Please advise 

## 2019-03-13 NOTE — Telephone Encounter (Signed)
Labs were ordered on 09/20/18 Thx

## 2019-03-14 NOTE — Telephone Encounter (Signed)
Pt.notified

## 2019-03-16 ENCOUNTER — Other Ambulatory Visit (INDEPENDENT_AMBULATORY_CARE_PROVIDER_SITE_OTHER): Payer: Medicare Other

## 2019-03-16 DIAGNOSIS — F419 Anxiety disorder, unspecified: Secondary | ICD-10-CM

## 2019-03-16 DIAGNOSIS — K588 Other irritable bowel syndrome: Secondary | ICD-10-CM | POA: Diagnosis not present

## 2019-03-16 DIAGNOSIS — E785 Hyperlipidemia, unspecified: Secondary | ICD-10-CM | POA: Diagnosis not present

## 2019-03-16 DIAGNOSIS — E739 Lactose intolerance, unspecified: Secondary | ICD-10-CM

## 2019-03-16 LAB — URINALYSIS, ROUTINE W REFLEX MICROSCOPIC
Bilirubin Urine: NEGATIVE
Hgb urine dipstick: NEGATIVE
Ketones, ur: NEGATIVE
Nitrite: NEGATIVE
RBC / HPF: NONE SEEN (ref 0–?)
Specific Gravity, Urine: 1.015 (ref 1.000–1.030)
Total Protein, Urine: NEGATIVE
Urine Glucose: NEGATIVE
Urobilinogen, UA: 0.2 (ref 0.0–1.0)
pH: 6 (ref 5.0–8.0)

## 2019-03-16 LAB — CBC WITH DIFFERENTIAL/PLATELET
Basophils Absolute: 0 10*3/uL (ref 0.0–0.1)
Basophils Relative: 1 % (ref 0.0–3.0)
Eosinophils Absolute: 0.1 10*3/uL (ref 0.0–0.7)
Eosinophils Relative: 2.8 % (ref 0.0–5.0)
HCT: 40 % (ref 36.0–46.0)
Hemoglobin: 13.7 g/dL (ref 12.0–15.0)
Lymphocytes Relative: 25.6 % (ref 12.0–46.0)
Lymphs Abs: 1.2 10*3/uL (ref 0.7–4.0)
MCHC: 34.2 g/dL (ref 30.0–36.0)
MCV: 94.5 fl (ref 78.0–100.0)
Monocytes Absolute: 0.5 10*3/uL (ref 0.1–1.0)
Monocytes Relative: 10.6 % (ref 3.0–12.0)
Neutro Abs: 2.9 10*3/uL (ref 1.4–7.7)
Neutrophils Relative %: 60 % (ref 43.0–77.0)
Platelets: 274 10*3/uL (ref 150.0–400.0)
RBC: 4.23 Mil/uL (ref 3.87–5.11)
RDW: 12.8 % (ref 11.5–15.5)
WBC: 4.9 10*3/uL (ref 4.0–10.5)

## 2019-03-16 LAB — BASIC METABOLIC PANEL
BUN: 13 mg/dL (ref 6–23)
CO2: 29 mEq/L (ref 19–32)
Calcium: 9.7 mg/dL (ref 8.4–10.5)
Chloride: 103 mEq/L (ref 96–112)
Creatinine, Ser: 0.74 mg/dL (ref 0.40–1.20)
GFR: 75.38 mL/min (ref >60.00)
Glucose, Bld: 99 mg/dL (ref 70–99)
Potassium: 4.2 mEq/L (ref 3.5–5.1)
Sodium: 141 mEq/L (ref 135–145)

## 2019-03-16 LAB — LIPID PANEL
Cholesterol: 180 mg/dL (ref 0–200)
HDL: 51 mg/dL (ref >39.00)
LDL Cholesterol: 98 mg/dL (ref 0–99)
NonHDL: 129.48
Total CHOL/HDL Ratio: 4
Triglycerides: 157 mg/dL — ABNORMAL HIGH (ref 0.0–149.0)
VLDL: 31.4 mg/dL (ref 0.0–40.0)

## 2019-03-16 LAB — TSH: TSH: 1.92 u[IU]/mL (ref 0.35–4.50)

## 2019-03-16 LAB — HEPATIC FUNCTION PANEL
ALT: 19 U/L (ref 0–35)
AST: 18 U/L (ref 0–37)
Albumin: 4.2 g/dL (ref 3.5–5.2)
Alkaline Phosphatase: 45 U/L (ref 39–117)
Bilirubin, Direct: 0.1 mg/dL (ref 0.0–0.3)
Total Bilirubin: 0.7 mg/dL (ref 0.2–1.2)
Total Protein: 7.3 g/dL (ref 6.0–8.3)

## 2019-03-19 ENCOUNTER — Telehealth: Payer: Self-pay | Admitting: *Deleted

## 2019-03-19 ENCOUNTER — Other Ambulatory Visit: Payer: Self-pay | Admitting: Internal Medicine

## 2019-03-19 ENCOUNTER — Telehealth: Payer: Self-pay | Admitting: Internal Medicine

## 2019-03-19 NOTE — Telephone Encounter (Signed)
Patient has appointment scheduled with Dr. Camila Li and Sharee Pimple on 4/22.  She expresses that she has had something going on with her ear for several weeks.  She does have access to do a Virtual Visit but prefers to come into the office.   Please advise patient in regard.

## 2019-03-19 NOTE — Telephone Encounter (Signed)
Called patient and LVM to inform them the nurse needs to either convert their upcoming AWV and provider visit to a virtual visit or reschedule the visit out into the future due to covid-19 safety measures. Nurse requested that the patient call-back and stated she would call them back at a later date.

## 2019-03-20 NOTE — Progress Notes (Signed)
Subjective:   [name] is a 83 y.o. female who presents for Medicare Annual (Subsequent) preventive examination.  I connected with patient 03/21/19 at 12:00 PM EDT by a video enabled telemedicine application and verified that I am speaking with the correct person using two identifiers. Patient stated full name and DOB. Patient gave permission to continue with virtual visit. Patient's location was at home and Nurse's location was at [place] office.   Review of Systems:  No ROS.  Medicare Wellness Visit. Additional risk factors are reflected in the social history.  Cardiac Risk Factors include: dyslipidemia;hypertension Sleep patterns: feels rested on waking, gets up 1 times nightly to void and sleeps 7-8 hours nightly.    Home Safety/Smoke Alarms: Feels safe in home. Smoke alarms in place.  Living environment; residence and Firearm Safety: 2-story house. Lives with husband, no needs for DME, good support system. Seat Belt Safety/Bike Helmet: Wears seat belt.      Objective:     Vitals: There were no vitals taken for this visit.  There is no height or weight on file to calculate BMI.  Advanced Directives 03/21/2019 02/10/2017  Does Patient Have a Medical Advance Directive? Yes Yes  Type of Paramedic of [place];Living will [place];Living will  Copy of [place] in Chart? No - copy requested No - copy requested    Tobacco Social History   Tobacco Use  Smoking Status Former Smoker  Smokeless Tobacco Never Used     Counseling given: Not Answered   Past Medical History:  Diagnosis Date  . Allergic rhinitis   . Atrophic vaginitis   . Baker's cyst    Left-Dr. Aluisio  . Cataract    Dr. Katy Fitch  . Colon polyps   . Fibroid   . Gastritis    chronic  . GERD (gastroesophageal reflux disease)   . Heart murmur   . Hemorrhoids   . Hyperlipidemia   . IBS (irritable bowel syndrome)   . MVP (mitral valve prolapse)     Antibiotics required for dental procedures  . OA (osteoarthritis)   . Osteoporosis 06/2018   T score -2.2 stable on Prolia  . Overactive bladder   . Thyroid disease   . Thyroid nodule   . Torn meniscus    bilateral   Past Surgical History:  Procedure Laterality Date  . CATARACT EXTRACTION, BILATERAL    . TONSILLECTOMY     Family History  Problem Relation Age of Onset  . Heart disease Mother   . Hypertension Mother   . Osteoporosis Mother   . Congestive Heart Failure Mother   . Pancreatic cancer Father   . Heart attack Brother   . Colon cancer Neg Hx   . Colon polyps Neg Hx   . Rectal cancer Neg Hx   . Stomach cancer Neg Hx    Social History   Socioeconomic History  . Marital status: Married    Spouse name: Not on file  . Number of children: 2  . Years of education: Not on file  . Highest education level: Not on file  Occupational History  . Occupation: retired    Fish farm manager: RETIRED  Social Needs  . Financial resource strain: Not hard at all  . Food insecurity:    Worry: Never true    Inability: Never true  . Transportation needs:    Medical: No    Non-medical: No  Tobacco Use  . Smoking status: Former Research scientist (life sciences)  .  Smokeless tobacco: Never Used  Substance and Sexual Activity  . Alcohol use: Yes    Alcohol/week: 5.0 standard drinks    Types: 5 Standard drinks or equivalent per week    Comment: Socially  . Drug use: No  . Sexual activity: Yes    Birth control/protection: Post-menopausal    Comment: 1st intercourse 83 yo-Fewer than 5 partners  Lifestyle  . Physical activity:    Days per week: 4 days    Minutes per session: 60 min  . Stress: Not on file  Relationships  . Social connections:    Talks on phone: More than three times a week    Gets together: More than three times a week    Attends religious service: 1 to 4 times per year    Active member of club or organization: Yes    Attends meetings of clubs or organizations: More than 4 times per year     Relationship status: Married  Other Topics Concern  . Not on file  Social History Narrative   Regular exercise-yes   Daily caffeine use    Outpatient Encounter Medications as of 03/21/2019  Medication Sig  . albuterol (PROVENTIL HFA;VENTOLIN HFA) 108 (90 BASE) MCG/ACT inhaler Inhale 2 puffs into the lungs every 6 (six) hours as needed for wheezing.  [name] azelastine (ASTELIN) 0.1 % nasal spray Place into both nostrils at bedtime. Use in each nostril as directed  . Azelastine HCl 0.15 % SOLN   . Calcium Carbonate-Vitamin D (CALCIUM-VITAMIN D) 500-200 MG-UNIT per tablet Take 3 tablets by mouth daily.   . Cholecalciferol 4000 units TABS Take 1 tablet by mouth daily.  [name] desonide (DESOWEN) 0.05 % cream Apply topically 2 (two) times daily.  [name] dexlansoprazole (DEXILANT) 60 MG capsule TAKE (1) CAPSULE DAILY.  [name] diclofenac sodium (VOLTAREN) 1 % GEL APPLY 4 GRAMS 4 TIMES DAILY.  [name] EPINEPHrine (EPIPEN 2-PAK) 0.3 mg/0.3 mL IJ SOAJ injection Inject 0.3 mLs (0.3 mg total) into the muscle as needed.  . hydroxychloroquine (PLAQUENIL) 200 MG tablet Take 400 mg bid x 2 days, then 200 mg daily for 5 dais if fever, cough, shortness of breath  . ipratropium (ATROVENT) 0.06 % nasal spray Place 1 spray into the nose 3 (three) times daily as needed for rhinitis. (Patient taking differently: Place 1 spray into the nose 2 (two) times daily. )  . levothyroxine (SYNTHROID) 50 MCG tablet TAKE 1 TABLET ONCE DAILY BEFORE BREAKFAST.  [name] LORazepam (ATIVAN) 1 MG tablet TAKE 1 OR 2 TABLETS TWICE A DAY AS NEEDED FOR ANXIETY.  . mometasone (NASONEX) 50 MCG/ACT nasal spray Place 2 sprays into the nose every morning.   . montelukast (SINGULAIR) 10 MG tablet Take 1 tablet (10 mg total) by mouth at bedtime.  [name] nystatin-triamcinolone ointment (MYCOLOG) Apply 1 application topically 2 (two) times daily. (Patient taking differently: Apply 1 application topically as needed. )  . SALINE NASAL SPRAY NA Place into the nose.  . simvastatin  (ZOCOR) 40 MG tablet TAKE 1 TABLET ONCE DAILY.  . VESICARE 10 MG tablet Take 1 tablet by mouth as needed.   . budesonide-formoterol (SYMBICORT) 160-4.5 MCG/ACT inhaler Inhale 2 puffs 2 (two) times daily into the lungs. (Patient taking differently: Inhale 2 puffs into the lungs as needed. )  . [DISCONTINUED] predniSONE (DELTASONE) 5 MG tablet Take 6 pills for first day, 5 pills second day, 4 pills third day, 3 pills fourth day, 2 pills the fifth day, and 1 pill sixth day. (Patient not taking:  Reported on 03/21/2019)   No facility-administered encounter medications on file as of 03/21/2019.     Activities of Daily Living In your present state of health, do you have any difficulty performing the following activities: 03/21/2019  Hearing? N  Vision? N  Difficulty concentrating or making decisions? N  Walking or climbing stairs? N  Dressing or bathing? N  Doing errands, shopping? N  Preparing Food and eating ? N  Using the Toilet? N  In the past six months, have you accidently leaked urine? N  Do you have problems with loss of bowel control? N  Managing your Medications? N  Managing your Finances? N  Housekeeping or managing your Housekeeping? N  Some recent data might be hidden    Patient Care Team: Plotnikov, Evie Lacks, MD as PCP - General Bensimhon, Shaune Pascal, MD as PCP - Advanced Heart Failure (Cardiology) Bennetta Laos, MD (Inactive) (Obstetrics and Gynecology) Mathilda Shipper, MD (Gastroenterology)    Assessment:   This is a routine wellness examination for Juliza. Physical assessment deferred to PCP.   Exercise Activities and Dietary recommendations Current Exercise Habits: Structured exercise class;Home exercise routine, Type of exercise: stretching;strength training/weights;calisthenics;walking(pilates), Time (Minutes): 50, Frequency (Times/Week): 4, Weekly Exercise (Minutes/Week): 200, Intensity: Mild, Exercise limited by: orthopedic condition(s)  Diet (meal preparation,  eat out, water intake, caffeinated beverages, dairy products, fruits and vegetables): in general, a "healthy" diet  , well balanced. eats a variety of fruits and vegetables daily, limits salt, fat/cholesterol, sugar,carbohydrates,caffeine, drinks 6-8 glasses of water daily.  Goals    . maintain current health status     Increase vegetable and fiber intake.       Fall Risk Fall Risk  03/21/2019 06/28/2018 02/10/2017 08/03/2016 07/07/2016  Falls in the past year? 0 No Yes No Yes  Comment - Emmi Telephone Survey: data to providers prior to load - Emmi Telephone Survey: data to providers prior to load -  Number falls in past yr: 0 - - - 1  Injury with Fall? - - No - No   Depression Screen PHQ 2/9 Scores 03/21/2019 09/20/2018 02/10/2017 07/07/2016  PHQ - 2 Score 0 0 0 0     Cognitive Function MMSE - Mini Mental State Exam 02/10/2017  Orientation to time 5  Orientation to Place 5  Registration 3  Attention/ Calculation 5  Recall 2  Language- name 2 objects 2  Language- repeat 1  Language- follow 3 step command 3  Language- read & follow direction 1  Write a sentence 1  Copy design 1  Total score 29       Ad8 score reviewed for issues:  Issues making decisions: no  Less interest in hobbies / activities: no  Repeats questions, stories (family complaining): no  Trouble using ordinary gadgets (microwave, computer, phone):no  Forgets the month or year: no  Mismanaging finances: no  Remembering appts: no  Daily problems with thinking and/or memory: no Ad8 score is= 0  Immunization History  Administered Date(s) Administered  . Influenza Whole 09/29/2007, 08/21/2008, 09/02/2010, 07/30/2012  . Influenza, High Dose Seasonal PF 09/03/2013, 08/06/2016, 08/20/2017, 09/20/2018  . Influenza,inj,Quad PF,6+ Mos 12/17/2014, 07/30/2015  . Pneumococcal Conjugate-13 01/09/2014  . Pneumococcal Polysaccharide-23 09/26/2006, 08/12/2015  . Td 04/14/2010  . Typhoid Inactivated 02/23/2013  .  Zoster 12/09/2006   Screening Tests Health Maintenance  Topic Date Due  . INFLUENZA VACCINE  06/30/2019  . PAP SMEAR-Modifier  04/05/2020  . TETANUS/TDAP  04/14/2020  . DEXA SCAN  Completed  .  PNA vac Low Risk Adult  Completed      Plan:   Reviewed health maintenance screenings with patient today and relevant education, vaccines, and/or referrals were provided.   Continue doing brain stimulating activities (puzzles, reading, adult coloring books, staying active) to keep memory sharp.   Continue to eat heart healthy diet (full of fruits, vegetables, whole grains, lean protein, water--limit salt, fat, and sugar intake) and increase physical activity as tolerated.  I have personally reviewed and noted the following in the patient's chart:   . Medical and social history . Use of alcohol, tobacco or illicit drugs  . Current medications and supplements . Functional ability and status . Nutritional status . Physical activity . Advanced directives . List of other physicians . Vitals . Screenings to include cognitive, depression, and falls . Referrals and appointments  In addition, I have reviewed and discussed with patient certain preventive protocols, quality metrics, and best practice recommendations. A written personalized care plan for preventive services as well as general preventive health recommendations were provided to patient.     Michiel Cowboy, RN  03/21/2019

## 2019-03-20 NOTE — Telephone Encounter (Signed)
Pt notified okay to come into office.

## 2019-03-21 ENCOUNTER — Other Ambulatory Visit: Payer: Self-pay

## 2019-03-21 ENCOUNTER — Ambulatory Visit (INDEPENDENT_AMBULATORY_CARE_PROVIDER_SITE_OTHER): Payer: Medicare Other | Admitting: Internal Medicine

## 2019-03-21 ENCOUNTER — Encounter: Payer: Self-pay | Admitting: Internal Medicine

## 2019-03-21 ENCOUNTER — Ambulatory Visit (INDEPENDENT_AMBULATORY_CARE_PROVIDER_SITE_OTHER): Payer: Medicare Other | Admitting: *Deleted

## 2019-03-21 DIAGNOSIS — Z0000 Encounter for general adult medical examination without abnormal findings: Secondary | ICD-10-CM | POA: Diagnosis not present

## 2019-03-21 DIAGNOSIS — R5382 Chronic fatigue, unspecified: Secondary | ICD-10-CM | POA: Diagnosis not present

## 2019-03-21 DIAGNOSIS — H9203 Otalgia, bilateral: Secondary | ICD-10-CM | POA: Insufficient documentation

## 2019-03-21 DIAGNOSIS — G8929 Other chronic pain: Secondary | ICD-10-CM | POA: Diagnosis not present

## 2019-03-21 DIAGNOSIS — M545 Low back pain: Secondary | ICD-10-CM

## 2019-03-21 DIAGNOSIS — E785 Hyperlipidemia, unspecified: Secondary | ICD-10-CM

## 2019-03-21 DIAGNOSIS — F419 Anxiety disorder, unspecified: Secondary | ICD-10-CM

## 2019-03-21 DIAGNOSIS — I10 Essential (primary) hypertension: Secondary | ICD-10-CM

## 2019-03-21 NOTE — Assessment & Plan Note (Addendum)
No added salt diet

## 2019-03-21 NOTE — Patient Instructions (Signed)

## 2019-03-21 NOTE — Assessment & Plan Note (Addendum)
Triglycerides were slightly elevated today.  Would be related to home confinement with COVID-19 Continue with simvastatin

## 2019-03-21 NOTE — Assessment & Plan Note (Signed)
Advil prn

## 2019-03-21 NOTE — Assessment & Plan Note (Signed)
Lorazepam prn  Potential benefits of a long term benzodiazepines  use as well as potential risks  and complications were explained to the patient and were aknowledged.  

## 2019-03-21 NOTE — Assessment & Plan Note (Addendum)
R>L serous otitis versus eustachian tube dysfunction Continue with Allegra May consider antibiotics if worse Ibuprofen 400 mg twice a day with meals should be helpful for pain Call if not better

## 2019-03-21 NOTE — Assessment & Plan Note (Signed)
Discussed recent labwork.

## 2019-03-21 NOTE — Patient Instructions (Addendum)
Continue doing brain stimulating activities (puzzles, reading, adult coloring books, staying active) to keep memory sharp.   Continue to eat heart healthy diet (full of fruits, vegetables, whole grains, lean protein, water--limit salt, fat, and sugar intake) and increase physical activity as tolerated.   Ms. [name] , Thank you for taking time to come for your Medicare Wellness Visit. I appreciate your ongoing commitment to your health goals. Please review the following plan we discussed and let me know if I can assist you in the future.   These are the goals we discussed: Goals    . maintain current health status     Increase vegetable and fiber intake.    . maintain current health status     Continue to exercise and eat healthy. Enjoy family, friends and life.       This is a list of the screening recommended for you and due dates:  Health Maintenance  Topic Date Due  . Flu Shot  06/30/2019  . Pap Smear  04/05/2020  . Tetanus Vaccine  04/14/2020  . DEXA scan (bone density measurement)  Completed  . Pneumonia vaccines  Completed    Preventive Care 43 Years and Older, Female Preventive care refers to lifestyle choices and visits with your health care provider that can promote health and wellness. What does preventive care include?  A yearly physical exam. This is also called an annual well check.  Dental exams once or twice a year.  Routine eye exams. Ask your health care provider how often you should have your eyes checked.  Personal lifestyle choices, including: ? Daily care of your teeth and gums. ? Regular physical activity. ? Eating a healthy diet. ? Avoiding tobacco and drug use. ? Limiting alcohol use. ? Practicing safe sex. ? Taking low-dose aspirin every day. ? Taking vitamin and mineral supplements as recommended by your health care provider. What happens during an annual well check? The services and screenings done by your health care provider during your  annual well check will depend on your age, overall health, lifestyle risk factors, and family history of disease. Counseling Your health care provider may ask you questions about your:  Alcohol use.  Tobacco use.  Drug use.  Emotional well-being.  Home and relationship well-being.  Sexual activity.  Eating habits.  History of falls.  Memory and ability to understand (cognition).  Work and work Statistician.  Reproductive health.  Screening You may have the following tests or measurements:  Height, weight, and BMI.  Blood pressure.  Lipid and cholesterol levels. These may be checked every 5 years, or more frequently if you are over 19 years old.  Skin check.  Lung cancer screening. You may have this screening every year starting at age 33 if you have a 30-pack-year history of smoking and currently smoke or have quit within the past 15 years.  Colorectal cancer screening. All adults should have this screening starting at age 49 and continuing until age 12. You will have tests every 1-10 years, depending on your results and the type of screening test. People at increased risk should start screening at an earlier age. Screening tests may include: ? Guaiac-based fecal occult blood testing. ? Fecal immunochemical test (FIT). ? Stool DNA test. ? Virtual colonoscopy. ? Sigmoidoscopy. During this test, a flexible tube with a tiny camera (sigmoidoscope) is used to examine your rectum and lower colon. The sigmoidoscope is inserted through your anus into your rectum and lower colon. ? Colonoscopy. During this  test, a long, thin, flexible tube with a tiny camera (colonoscope) is used to examine your entire colon and rectum.  Hepatitis C blood test.  Hepatitis B blood test.  Sexually transmitted disease (STD) testing.  Diabetes screening. This is done by checking your blood sugar (glucose) after you have not eaten for a while (fasting). You may have this done every 1-3  years.  Bone density scan. This is done to screen for osteoporosis. You may have this done starting at age 34.  Mammogram. This may be done every 1-2 years. Talk to your health care provider about how often you should have regular mammograms. Talk with your health care provider about your test results, treatment options, and if necessary, the need for more tests. Vaccines Your health care provider may recommend certain vaccines, such as:  Influenza vaccine. This is recommended every year.  Tetanus, diphtheria, and acellular pertussis (Tdap, Td) vaccine. You may need a Td booster every 10 years.  Varicella vaccine. You may need this if you have not been vaccinated.  Zoster vaccine. You may need this after age 58.  Measles, mumps, and rubella (MMR) vaccine. You may need at least one dose of MMR if you were born in 1957 or later. You may also need a second dose.  Pneumococcal 13-valent conjugate (PCV13) vaccine. One dose is recommended after age 35.  Pneumococcal polysaccharide (PPSV23) vaccine. One dose is recommended after age 61.  Meningococcal vaccine. You may need this if you have certain conditions.  Hepatitis A vaccine. You may need this if you have certain conditions or if you travel or work in places where you may be exposed to hepatitis A.  Hepatitis B vaccine. You may need this if you have certain conditions or if you travel or work in places where you may be exposed to hepatitis B.  Haemophilus influenzae type b (Hib) vaccine. You may need this if you have certain conditions. Talk to your health care provider about which screenings and vaccines you need and how often you need them. This information is not intended to replace advice given to you by your health care provider. Make sure you discuss any questions you have with your health care provider. Document Released: 12/12/2015 Document Revised: 01/05/2018 Document Reviewed: 09/16/2015 Elsevier Interactive Patient Education   2019 [employer].

## 2019-03-21 NOTE — Progress Notes (Signed)
Subjective:  Patient ID: [name], female    DOB: 07-14-1936  Age: 83 y.o. MRN: [id number]  CC: No chief complaint on file.   HPI [name] presents for dull pain in B ears L>R x 2 weeks off and on No d/c C/o R foot pain c/o itchy skin between the shoulder blades at times.  It was checked by her dermatologist.  She was suggested take Allegra and use less soap  Outpatient Medications Prior to Visit  Medication Sig Dispense Refill  . albuterol (PROVENTIL HFA;VENTOLIN HFA) 108 (90 BASE) MCG/ACT inhaler Inhale 2 puffs into the lungs every 6 (six) hours as needed for wheezing. 1 Inhaler 3  . azelastine (ASTELIN) 0.1 % nasal spray Place into both nostrils at bedtime. Use in each nostril as directed    . Azelastine HCl 0.15 % SOLN     . budesonide-formoterol (SYMBICORT) 160-4.5 MCG/ACT inhaler Inhale 2 puffs 2 (two) times daily into the lungs. (Patient taking differently: Inhale 2 puffs into the lungs as needed. ) 1 Inhaler 3  . Calcium Carbonate-Vitamin D (CALCIUM-VITAMIN D) 500-200 MG-UNIT per tablet Take 3 tablets by mouth daily.     . Cholecalciferol 4000 units TABS Take 1 tablet by mouth daily.    [name] desonide (DESOWEN) 0.05 % cream Apply topically 2 (two) times daily. 30 g 0  . dexlansoprazole (DEXILANT) 60 MG capsule TAKE (1) CAPSULE DAILY. 30 capsule 6  . diclofenac sodium (VOLTAREN) 1 % GEL APPLY 4 GRAMS 4 TIMES DAILY. 100 g 0  . EPINEPHrine (EPIPEN 2-PAK) 0.3 mg/0.3 mL IJ SOAJ injection Inject 0.3 mLs (0.3 mg total) into the muscle as needed. 1 Device 1  . hydroxychloroquine (PLAQUENIL) 200 MG tablet Take 400 mg bid x 2 days, then 200 mg daily for 5 dais if fever, cough, shortness of breath 13 tablet 0  . ipratropium (ATROVENT) 0.06 % nasal spray Place 1 spray into the nose 3 (three) times daily as needed for rhinitis. (Patient taking differently: Place 1 spray into the nose 2 (two) times daily. ) 15 mL 0  . levothyroxine (SYNTHROID) 50 MCG tablet TAKE 1 TABLET ONCE DAILY BEFORE  BREAKFAST. 30 tablet 11  . LORazepam (ATIVAN) 1 MG tablet TAKE 1 OR 2 TABLETS TWICE A DAY AS NEEDED FOR ANXIETY. 100 tablet 2  . mometasone (NASONEX) 50 MCG/ACT nasal spray Place 2 sprays into the nose every morning.     . montelukast (SINGULAIR) 10 MG tablet Take 1 tablet (10 mg total) by mouth at bedtime. 90 tablet 1  . nystatin-triamcinolone ointment (MYCOLOG) Apply 1 application topically 2 (two) times daily. (Patient taking differently: Apply 1 application topically as needed. ) 30 g 1  . SALINE NASAL SPRAY NA Place into the nose.    . simvastatin (ZOCOR) 40 MG tablet TAKE 1 TABLET ONCE DAILY. 90 tablet 1  . VESICARE 10 MG tablet Take 1 tablet by mouth as needed.      No facility-administered medications prior to visit.     ROS: Review of Systems  Constitutional: Negative for activity change, appetite change, chills, fatigue and unexpected weight change.  HENT: Negative for congestion, mouth sores and sinus pressure.   Eyes: Negative for visual disturbance.  Respiratory: Negative for cough and chest tightness.   Gastrointestinal: Negative for abdominal pain and nausea.  Genitourinary: Negative for difficulty urinating, frequency and vaginal pain.  Musculoskeletal: Positive for arthralgias and back pain. Negative for gait problem.  Skin: Negative for pallor and rash.  Neurological: Negative for  dizziness, tremors, weakness, numbness and headaches.  Psychiatric/Behavioral: Negative for confusion, sleep disturbance and suicidal ideas.    Objective:  There were no vitals taken for this visit.  BP Readings from Last 3 Encounters:  01/03/19 118/70  12/29/18 128/84  12/26/18 140/74    Wt Readings from Last 3 Encounters:  11/06/18 125 lb (56.7 kg)  04/07/17 125 lb (56.7 kg)  04/01/17 122 lb (55.3 kg)    Physical Exam Constitutional:      General: She is not in acute distress.    Appearance: She is well-developed.  HENT:     Head: Normocephalic.     Right Ear: External ear  normal.     Left Ear: External ear normal.     Nose: Nose normal.  Eyes:     General:        Right eye: No discharge.        Left eye: No discharge.     Conjunctiva/sclera: Conjunctivae normal.     Pupils: Pupils are equal, round, and reactive to light.  Neck:     Musculoskeletal: Normal range of motion and neck supple.     Thyroid: No thyromegaly.     Vascular: No JVD.     Trachea: No tracheal deviation.  Cardiovascular:     Rate and Rhythm: Normal rate and regular rhythm.     Heart sounds: Normal heart sounds.  Pulmonary:     Effort: No respiratory distress.     Breath sounds: No stridor. No wheezing.  Abdominal:     General: Bowel sounds are normal. There is no distension.     Palpations: Abdomen is soft. There is no mass.     Tenderness: There is no abdominal tenderness. There is no guarding or rebound.  Musculoskeletal:        General: Tenderness present.  Lymphadenopathy:     Cervical: No cervical adenopathy.  Skin:    Findings: No erythema or rash.  Neurological:     Cranial Nerves: No cranial nerve deficit.     Motor: No abnormal muscle tone.     Coordination: Coordination normal.     Deep Tendon Reflexes: Reflexes normal.  Psychiatric:        Behavior: Behavior normal.        Thought Content: Thought content normal.        Judgment: Judgment normal.   mild fluid accumulation behind B TMs, no erythema.  No wax  R mid-foot is tender  Lab Results  Component Value Date   WBC 4.9 03/16/2019   HGB 13.7 03/16/2019   HCT 40.0 03/16/2019   PLT 274.0 03/16/2019   GLUCOSE 99 03/16/2019   CHOL 180 03/16/2019   TRIG 157.0 (H) 03/16/2019   HDL 51.00 03/16/2019   LDLDIRECT 117.0 06/09/2018   LDLCALC 98 03/16/2019   ALT 19 03/16/2019   AST 18 03/16/2019   NA 141 03/16/2019   K 4.2 03/16/2019   CL 103 03/16/2019   CREATININE 0.74 03/16/2019   BUN 13 03/16/2019   CO2 29 03/16/2019   TSH 1.92 03/16/2019   HGBA1C 6.0 07/08/2016    Ct Head Wo Contrast  Result  Date: 12/27/2018 CLINICAL DATA:  Fall in bathroom 3 days ago. EXAM: CT HEAD WITHOUT CONTRAST TECHNIQUE: Contiguous axial images were obtained from the base of the skull through the vertex without intravenous contrast. COMPARISON:  CT maxillofacial 08/13/2016 FINDINGS: Brain: A homogeneous hyperdense dural-based lesion is present over the posterior left frontal lobe measuring 1.6 x 1.7 x  1.0 cm. No other acute infarct, hemorrhage, or mass lesion is present. Mild white matter changes are within normal limits for age. Ventricles are of normal size. There is no mass effect or midline shift. Brainstem and cerebellum are normal. Vascular: Atherosclerotic changes are present within the cavernous internal carotid arteries bilaterally. There is no hyperdense vessel. Skull: Calvarium is intact. No focal lytic or blastic lesions are present. No significant extracranial soft tissue injury is present. Sinuses/Orbits: Paranasal sinuses and mastoid air cells are clear. Globes and orbits are within normal limits. Other: None IMPRESSION: 1. No acute abnormality. 2. Dural-based lesion measuring up to 1.7 cm is indicative of a meningioma. Electronically Signed   By: San Morelle M.D.   On: 12/27/2018 14:06    Assessment & Plan:   There are no diagnoses linked to this encounter.   No orders of the defined types were placed in this encounter.    Follow-up: No follow-ups on file.  Walker Kehr, MD

## 2019-04-04 ENCOUNTER — Ambulatory Visit: Payer: Medicare Other

## 2019-04-10 ENCOUNTER — Encounter: Payer: Medicare Other | Admitting: Gynecology

## 2019-04-13 DIAGNOSIS — R3915 Urgency of urination: Secondary | ICD-10-CM | POA: Diagnosis not present

## 2019-04-13 DIAGNOSIS — R3989 Other symptoms and signs involving the genitourinary system: Secondary | ICD-10-CM | POA: Diagnosis not present

## 2019-04-13 DIAGNOSIS — R350 Frequency of micturition: Secondary | ICD-10-CM | POA: Diagnosis not present

## 2019-04-16 ENCOUNTER — Other Ambulatory Visit: Payer: Self-pay

## 2019-04-16 MED ORDER — NONFORMULARY OR COMPOUNDED ITEM: 0 refills | Status: DC

## 2019-04-20 DIAGNOSIS — H00031 Abscess of right upper eyelid: Secondary | ICD-10-CM | POA: Diagnosis not present

## 2019-04-26 ENCOUNTER — Ambulatory Visit (INDEPENDENT_AMBULATORY_CARE_PROVIDER_SITE_OTHER): Payer: Medicare Other | Admitting: Gynecology

## 2019-04-26 ENCOUNTER — Other Ambulatory Visit: Payer: Self-pay

## 2019-04-26 DIAGNOSIS — M810 Age-related osteoporosis without current pathological fracture: Secondary | ICD-10-CM | POA: Diagnosis not present

## 2019-04-26 MED ORDER — DENOSUMAB 60 MG/ML ~~LOC~~ SOSY
60.0000 mg | PREFILLED_SYRINGE | Freq: Once | SUBCUTANEOUS | Status: AC
  Administered 2019-04-26: 60 mg via SUBCUTANEOUS

## 2019-04-28 ENCOUNTER — Other Ambulatory Visit: Payer: Self-pay | Admitting: Internal Medicine

## 2019-05-02 ENCOUNTER — Ambulatory Visit (HOSPITAL_COMMUNITY): Payer: Medicare Other

## 2019-05-02 ENCOUNTER — Ambulatory Visit (HOSPITAL_COMMUNITY): Payer: Self-pay | Admitting: Internal Medicine

## 2019-05-17 NOTE — Telephone Encounter (Signed)
[place] 04/26/2019 NEXT INJECTION 10/28/2019

## 2019-05-21 ENCOUNTER — Ambulatory Visit: Payer: Medicare Other | Admitting: Psychology

## 2019-05-27 DIAGNOSIS — Z20828 Contact with and (suspected) exposure to other viral communicable diseases: Secondary | ICD-10-CM | POA: Diagnosis not present

## 2019-06-12 ENCOUNTER — Other Ambulatory Visit: Payer: Self-pay | Admitting: Internal Medicine

## 2019-06-16 ENCOUNTER — Other Ambulatory Visit: Payer: Self-pay | Admitting: Neurosurgery

## 2019-06-16 ENCOUNTER — Other Ambulatory Visit: Payer: Self-pay | Admitting: Internal Medicine

## 2019-06-16 DIAGNOSIS — D329 Benign neoplasm of meninges, unspecified: Secondary | ICD-10-CM

## 2019-06-19 ENCOUNTER — Other Ambulatory Visit (INDEPENDENT_AMBULATORY_CARE_PROVIDER_SITE_OTHER): Payer: Medicare Other

## 2019-06-19 ENCOUNTER — Other Ambulatory Visit: Payer: Self-pay | Admitting: Internal Medicine

## 2019-06-19 DIAGNOSIS — E785 Hyperlipidemia, unspecified: Secondary | ICD-10-CM

## 2019-06-19 DIAGNOSIS — R5382 Chronic fatigue, unspecified: Secondary | ICD-10-CM

## 2019-06-19 DIAGNOSIS — R42 Dizziness and giddiness: Secondary | ICD-10-CM

## 2019-06-19 LAB — LIPID PANEL
Cholesterol: 190 mg/dL (ref 0–200)
HDL: 51.1 mg/dL (ref >39.00)
NonHDL: 139.23
Total CHOL/HDL Ratio: 4
Triglycerides: 206 mg/dL — ABNORMAL HIGH (ref 0.0–149.0)
VLDL: 41.2 mg/dL — ABNORMAL HIGH (ref 0.0–40.0)

## 2019-06-19 LAB — CBC WITH DIFFERENTIAL/PLATELET
Basophils Absolute: 0.1 10*3/uL (ref 0.0–0.1)
Basophils Relative: 1.1 % (ref 0.0–3.0)
Eosinophils Absolute: 0.2 10*3/uL (ref 0.0–0.7)
Eosinophils Relative: 2.6 % (ref 0.0–5.0)
HCT: 39.9 % (ref 36.0–46.0)
Hemoglobin: 13.5 g/dL (ref 12.0–15.0)
Lymphocytes Relative: 21.4 % (ref 12.0–46.0)
Lymphs Abs: 1.3 10*3/uL (ref 0.7–4.0)
MCHC: 33.8 g/dL (ref 30.0–36.0)
MCV: 93 fl (ref 78.0–100.0)
Monocytes Absolute: 0.6 10*3/uL (ref 0.1–1.0)
Monocytes Relative: 10 % (ref 3.0–12.0)
Neutro Abs: 3.9 10*3/uL (ref 1.4–7.7)
Neutrophils Relative %: 64.9 % (ref 43.0–77.0)
Platelets: 255 10*3/uL (ref 150.0–400.0)
RBC: 4.29 Mil/uL (ref 3.87–5.11)
RDW: 13.3 % (ref 11.5–15.5)
WBC: 6 10*3/uL (ref 4.0–10.5)

## 2019-06-19 LAB — BASIC METABOLIC PANEL
BUN: 16 mg/dL (ref 6–23)
CO2: 24 mEq/L (ref 19–32)
Calcium: 8.8 mg/dL (ref 8.4–10.5)
Chloride: 103 mEq/L (ref 96–112)
Creatinine, Ser: 0.69 mg/dL (ref 0.40–1.20)
GFR: 81.66 mL/min (ref >60.00)
Glucose, Bld: 93 mg/dL (ref 70–99)
Potassium: 4 mEq/L (ref 3.5–5.1)
Sodium: 137 mEq/L (ref 135–145)

## 2019-06-19 LAB — LDL CHOLESTEROL, DIRECT: Direct LDL: 109 mg/dL

## 2019-06-19 LAB — HEPATIC FUNCTION PANEL
ALT: 16 U/L (ref 0–35)
AST: 18 U/L (ref 0–37)
Albumin: 4.3 g/dL (ref 3.5–5.2)
Alkaline Phosphatase: 36 U/L — ABNORMAL LOW (ref 39–117)
Bilirubin, Direct: 0.1 mg/dL (ref 0.0–0.3)
Total Bilirubin: 0.6 mg/dL (ref 0.2–1.2)
Total Protein: 7 g/dL (ref 6.0–8.3)

## 2019-06-19 LAB — URINALYSIS
Bilirubin Urine: NEGATIVE
Hgb urine dipstick: NEGATIVE
Ketones, ur: NEGATIVE
Leukocytes,Ua: NEGATIVE
Nitrite: NEGATIVE
Specific Gravity, Urine: 1.02 (ref 1.000–1.030)
Total Protein, Urine: NEGATIVE
Urine Glucose: NEGATIVE
Urobilinogen, UA: 0.2 (ref 0.0–1.0)
pH: 6 (ref 5.0–8.0)

## 2019-06-19 LAB — TSH: TSH: 2.09 u[IU]/mL (ref 0.35–4.50)

## 2019-06-20 ENCOUNTER — Encounter: Payer: Medicare Other | Admitting: Gynecology

## 2019-06-20 ENCOUNTER — Other Ambulatory Visit: Payer: Self-pay

## 2019-06-20 ENCOUNTER — Encounter: Payer: Self-pay | Admitting: Internal Medicine

## 2019-06-20 ENCOUNTER — Ambulatory Visit (INDEPENDENT_AMBULATORY_CARE_PROVIDER_SITE_OTHER): Payer: Medicare Other | Admitting: Internal Medicine

## 2019-06-20 VITALS — BP 120/62 | HR 81 | Temp 98.4°F | Ht 62.0 in

## 2019-06-20 DIAGNOSIS — I10 Essential (primary) hypertension: Secondary | ICD-10-CM | POA: Diagnosis not present

## 2019-06-20 DIAGNOSIS — F419 Anxiety disorder, unspecified: Secondary | ICD-10-CM

## 2019-06-20 DIAGNOSIS — E785 Hyperlipidemia, unspecified: Secondary | ICD-10-CM

## 2019-06-20 DIAGNOSIS — R5382 Chronic fatigue, unspecified: Secondary | ICD-10-CM | POA: Diagnosis not present

## 2019-06-20 MED ORDER — TRIAMCINOLONE ACETONIDE 0.1 % EX CREA: 1.0000 "application " | TOPICAL_CREAM | Freq: Three times a day (TID) | CUTANEOUS | 3 refills | Status: DC

## 2019-06-20 NOTE — Assessment & Plan Note (Signed)
Hold Simvastatin  X 1- 2 wks to see if better

## 2019-06-20 NOTE — Assessment & Plan Note (Signed)
cardiac CT scan for calcium scoring offered 1/20 

## 2019-06-20 NOTE — Progress Notes (Signed)
Subjective:  Patient ID: [name], female    DOB: [birth year]  Age: 83 y.o. MRN: [id number]  CC: No chief complaint on file.   HPI [name] presents for insomnia, anxiety,  HTN, allergies, dyslipidemia f/u C/o rash on chest off and on x 2 weeks C/o fatigue - episodic   Outpatient Medications Prior to Visit  Medication Sig Dispense Refill  . albuterol (PROVENTIL HFA;VENTOLIN HFA) 108 (90 BASE) MCG/ACT inhaler Inhale 2 puffs into the lungs every 6 (six) hours as needed for wheezing. 1 Inhaler 3  . azelastine (ASTELIN) 0.1 % nasal spray Place into both nostrils at bedtime. Use in each nostril as directed    . Azelastine HCl 0.15 % SOLN     . Calcium Carbonate-Vitamin D (CALCIUM-VITAMIN D) 500-200 MG-UNIT per tablet Take 3 tablets by mouth daily.     . Cholecalciferol 4000 units TABS Take 1 tablet by mouth daily.    [name] desonide (DESOWEN) 0.05 % cream Apply topically 2 (two) times daily. 30 g 0  . dexlansoprazole (DEXILANT) 60 MG capsule TAKE (1) CAPSULE DAILY. 30 capsule 6  . diclofenac sodium (VOLTAREN) 1 % GEL APPLY 4 GRAMS 4 TIMES DAILY. 100 g 0  . EPINEPHrine (EPIPEN 2-PAK) 0.3 mg/0.3 mL IJ SOAJ injection Inject 0.3 mLs (0.3 mg total) into the muscle as needed. 1 Device 1  . hydroxychloroquine (PLAQUENIL) 200 MG tablet Take 400 mg bid x 2 days, then 200 mg daily for 5 dais if fever, cough, shortness of breath 13 tablet 0  . ipratropium (ATROVENT) 0.06 % nasal spray Place 1 spray into the nose 3 (three) times daily as needed for rhinitis. (Patient taking differently: Place 1 spray into the nose 2 (two) times daily. ) 15 mL 0  . levothyroxine (SYNTHROID) 50 MCG tablet TAKE 1 TABLET ONCE DAILY BEFORE BREAKFAST. 30 tablet 11  . LORazepam (ATIVAN) 1 MG tablet TAKE 1 OR 2 TABLETS TWICE A DAY AS NEEDED FOR ANXIETY. 100 tablet 1  . mometasone (NASONEX) 50 MCG/ACT nasal spray Place 2 sprays into the nose every morning.     . montelukast (SINGULAIR) 10 MG tablet Take 1 tablet (10 mg total) by  mouth at bedtime. 90 tablet 1  . NONFORMULARY OR COMPOUNDED ITEM Estradiol 0.02% vaginal cream S:Insert 20ml vaginally twice a week. 90 each 0  . nystatin-triamcinolone ointment (MYCOLOG) Apply 1 application topically 2 (two) times daily. (Patient taking differently: Apply 1 application topically as needed. ) 30 g 1  . SALINE NASAL SPRAY NA Place into the nose.    . simvastatin (ZOCOR) 40 MG tablet TAKE 1 TABLET ONCE DAILY. 90 tablet 0  . VESICARE 10 MG tablet Take 1 tablet by mouth as needed.     . budesonide-formoterol (SYMBICORT) 160-4.5 MCG/ACT inhaler Inhale 2 puffs 2 (two) times daily into the lungs. (Patient taking differently: Inhale 2 puffs into the lungs as needed. ) 1 Inhaler 3   No facility-administered medications prior to visit.     ROS: Review of Systems  Constitutional: Positive for fatigue. Negative for activity change, appetite change, chills and unexpected weight change.  HENT: Negative for congestion, mouth sores and sinus pressure.   Eyes: Negative for visual disturbance.  Respiratory: Negative for cough and chest tightness.   Gastrointestinal: Negative for abdominal pain and nausea.  Genitourinary: Negative for difficulty urinating, frequency and vaginal pain.  Musculoskeletal: Positive for arthralgias. Negative for back pain and gait problem.  Skin: Negative for pallor and rash.  Neurological: Negative for dizziness,  tremors, weakness, numbness and headaches.  Psychiatric/Behavioral: Positive for sleep disturbance. Negative for confusion. The patient is nervous/anxious.     Objective:  Ht 5\' 2"  (1.575 m)   BMI 22.86 kg/m   BP Readings from Last 3 Encounters:  03/21/19 118/76  01/03/19 118/70  12/29/18 128/84    Wt Readings from Last 3 Encounters:  11/06/18 125 lb (56.7 kg)  04/07/17 125 lb (56.7 kg)  04/01/17 122 lb (55.3 kg)    Physical Exam Constitutional:      General: She is not in acute distress.    Appearance: She is well-developed.  HENT:      Head: Normocephalic.     Right Ear: External ear normal.     Left Ear: External ear normal.     Nose: Nose normal.  Eyes:     General:        Right eye: No discharge.        Left eye: No discharge.     Conjunctiva/sclera: Conjunctivae normal.     Pupils: Pupils are equal, round, and reactive to light.  Neck:     Musculoskeletal: Normal range of motion and neck supple.     Thyroid: No thyromegaly.     Vascular: No JVD.     Trachea: No tracheal deviation.  Cardiovascular:     Rate and Rhythm: Normal rate and regular rhythm.     Heart sounds: Normal heart sounds.  Pulmonary:     Effort: No respiratory distress.     Breath sounds: No stridor. No wheezing.  Abdominal:     General: Bowel sounds are normal. There is no distension.     Palpations: Abdomen is soft. There is no mass.     Tenderness: There is no abdominal tenderness. There is no guarding or rebound.  Musculoskeletal:        General: No tenderness.  Lymphadenopathy:     Cervical: No cervical adenopathy.  Skin:    Findings: No erythema or rash.  Neurological:     Cranial Nerves: No cranial nerve deficit.     Motor: No abnormal muscle tone.     Coordination: Coordination normal.     Deep Tendon Reflexes: Reflexes normal.  Psychiatric:        Behavior: Behavior normal.        Thought Content: Thought content normal.        Judgment: Judgment normal.   rash on V-neck  Lab Results  Component Value Date   WBC 6.0 06/19/2019   HGB 13.5 06/19/2019   HCT 39.9 06/19/2019   PLT 255.0 06/19/2019   GLUCOSE 93 06/19/2019   CHOL 190 06/19/2019   TRIG 206.0 (H) 06/19/2019   HDL 51.10 06/19/2019   LDLDIRECT 109.0 06/19/2019   LDLCALC 98 03/16/2019   ALT 16 06/19/2019   AST 18 06/19/2019   NA 137 06/19/2019   K 4.0 06/19/2019   CL 103 06/19/2019   CREATININE 0.69 06/19/2019   BUN 16 06/19/2019   CO2 24 06/19/2019   TSH 2.09 06/19/2019   HGBA1C 6.0 07/08/2016    Ct Head Wo Contrast  Result Date: 12/27/2018  CLINICAL DATA:  Fall in bathroom 3 days ago. EXAM: CT HEAD WITHOUT CONTRAST TECHNIQUE: Contiguous axial images were obtained from the base of the skull through the vertex without intravenous contrast. COMPARISON:  CT maxillofacial 08/13/2016 FINDINGS: Brain: A homogeneous hyperdense dural-based lesion is present over the posterior left frontal lobe measuring 1.6 x 1.7 x 1.0 cm. No other acute infarct, hemorrhage,  or mass lesion is present. Mild white matter changes are within normal limits for age. Ventricles are of normal size. There is no mass effect or midline shift. Brainstem and cerebellum are normal. Vascular: Atherosclerotic changes are present within the cavernous internal carotid arteries bilaterally. There is no hyperdense vessel. Skull: Calvarium is intact. No focal lytic or blastic lesions are present. No significant extracranial soft tissue injury is present. Sinuses/Orbits: Paranasal sinuses and mastoid air cells are clear. Globes and orbits are within normal limits. Other: None IMPRESSION: 1. No acute abnormality. 2. Dural-based lesion measuring up to 1.7 cm is indicative of a meningioma. Electronically Signed   By: San Morelle M.D.   On: 12/27/2018 14:06    Assessment & Plan:   There are no diagnoses linked to this encounter.   No orders of the defined types were placed in this encounter.    Follow-up: No follow-ups on file.  Walker Kehr, MD

## 2019-06-20 NOTE — Assessment & Plan Note (Signed)
Lorazepam prn  Potential benefits of a long term benzodiazepines  use as well as potential risks  and complications were explained to the patient and were aknowledged.  

## 2019-06-25 ENCOUNTER — Other Ambulatory Visit: Payer: Self-pay

## 2019-06-26 ENCOUNTER — Encounter: Payer: Self-pay | Admitting: Gynecology

## 2019-06-26 ENCOUNTER — Ambulatory Visit (INDEPENDENT_AMBULATORY_CARE_PROVIDER_SITE_OTHER): Payer: Medicare Other | Admitting: Gynecology

## 2019-06-26 VITALS — BP 120/78 | Ht 62.0 in

## 2019-06-26 DIAGNOSIS — R350 Frequency of micturition: Secondary | ICD-10-CM

## 2019-06-26 DIAGNOSIS — Z01419 Encounter for gynecological examination (general) (routine) without abnormal findings: Secondary | ICD-10-CM | POA: Diagnosis not present

## 2019-06-26 DIAGNOSIS — N952 Postmenopausal atrophic vaginitis: Secondary | ICD-10-CM

## 2019-06-26 DIAGNOSIS — M810 Age-related osteoporosis without current pathological fracture: Secondary | ICD-10-CM

## 2019-06-26 NOTE — Addendum Note (Signed)
Addended by: Lorine Bears on: 06/26/2019 12:39 PM   Modules accepted: Orders

## 2019-06-26 NOTE — Patient Instructions (Signed)
Follow-up this fall for your next Prolia shot when due.

## 2019-06-26 NOTE — Progress Notes (Signed)
    Jaryah Aracena Mar 31, 1936 [id number]        83 y.o.  [id number] for breast and pelvic exam.  Past medical history,surgical history, problem list, medications, allergies, family history and social history were all reviewed and documented as reviewed in the EPIC chart.  ROS:  Performed with pertinent positives and negatives included in the history, assessment and plan.   Additional significant findings : None   Exam: Wandra Scot assistant Vitals:   06/26/19 1127  BP: 120/78  Height: 5\' 2"  (1.575 m)   Body mass index is 22.86 kg/m.  General appearance:  Normal affect, orientation and appearance. Skin: Grossly normal HEENT: Without gross lesions.  No cervical or supraclavicular adenopathy. Thyroid normal.  Lungs:  Clear without wheezing, rales or rhonchi Cardiac: RR, without RMG Abdominal:  Soft, nontender, without masses, guarding, rebound, organomegaly or hernia Breasts:  Examined lying and sitting without masses, retractions, discharge or axillary adenopathy. Pelvic:  Ext, BUS, Vagina: With atrophic changes  Cervix: With atrophic changes.  Pap smear done  Uterus: Anteverted, normal size, shape and contour, midline and mobile nontender   Adnexa: Without masses or tenderness    Anus and perineum: Normal   Rectovaginal: Normal sphincter tone without palpated masses or tenderness.    Assessment/Plan:  83 y.o. [id number] female for breast and pelvic exam.  1. Postmenopausal/atrophic genital changes.  Uses estradiol prefilled syringes from [facility] twice weekly.  Is doing well with this.  No bleeding.  We discussed the issues of absorption with systemic effects and risks.  Patient comfortable continuing.  Refill x1 year provided.  Uses triamcinolone 0.1% ointment occasionally for extra irritation externally.  Has supply but will call when needs more. 2. Intermittent urinary frequency.  Has had the issue for years and has been seen by urology.  Check baseline urine today. 3.  Osteoporosis.  DEXA 06/2018 T score -2.2.  On Prolia x3 years.  Continue on Prolia and repeat DEXA next year at 2-year interval. 4. Mammography 07/2018.  Continue with annual mammography this fall.  Breast exam normal today. 5. Pap smear 2018.  Patient uncomfortable with stopping Pap smears per current screening recommendations and requests one done today. 6. Colonoscopy 2013.  Repeat at their recommended interval. 7. Health maintenance.  No routine lab work done as patient does this elsewhere.  Follow-up 1 year, sooner as needed.   Anastasio Auerbach MD, 12:25 PM 06/26/2019

## 2019-06-27 ENCOUNTER — Telehealth: Payer: Self-pay | Admitting: *Deleted

## 2019-06-27 LAB — PAP IG W/ RFLX HPV ASCU

## 2019-06-27 MED ORDER — NONFORMULARY OR COMPOUNDED ITEM: 0 refills | Status: DC

## 2019-06-27 NOTE — Telephone Encounter (Signed)
Rx called in 

## 2019-06-27 NOTE — Telephone Encounter (Signed)
-----   Message from Anastasio Auerbach, MD sent at 06/26/2019 12:35 PM EDT ----- Call in 2 [place] refill for the vaginal estradiol prefilled syringes twice weekly x1 year

## 2019-06-28 LAB — URINALYSIS, COMPLETE W/RFL CULTURE
Bilirubin Urine: NEGATIVE
Glucose, UA: NEGATIVE
Hgb urine dipstick: NEGATIVE
Hyaline Cast: NONE SEEN /LPF
Ketones, ur: NEGATIVE
Leukocyte Esterase: NEGATIVE
Nitrites, Initial: NEGATIVE
Protein, ur: NEGATIVE
RBC / HPF: NONE SEEN /HPF (ref 0–2)
Specific Gravity, Urine: 1.02 (ref 1.001–1.03)
pH: 5.5 (ref 5.0–8.0)

## 2019-06-28 LAB — URINE CULTURE
MICRO NUMBER:: 715692
Result:: NO GROWTH
SPECIMEN QUALITY:: ADEQUATE

## 2019-06-28 LAB — CULTURE INDICATED

## 2019-07-07 ENCOUNTER — Other Ambulatory Visit: Payer: Self-pay | Admitting: Internal Medicine

## 2019-07-09 ENCOUNTER — Ambulatory Visit (HOSPITAL_COMMUNITY)
Admission: RE | Admit: 2019-07-09 | Discharge: 2019-07-09 | Disposition: A | Discharge status: Home / Self Care | Payer: Medicare Other | Source: Ambulatory Visit | Attending: Internal Medicine | Admitting: Internal Medicine

## 2019-07-09 ENCOUNTER — Other Ambulatory Visit: Payer: Self-pay

## 2019-07-09 DIAGNOSIS — I080 Rheumatic disorders of both mitral and aortic valves: Secondary | ICD-10-CM | POA: Insufficient documentation

## 2019-07-09 DIAGNOSIS — I509 Heart failure, unspecified: Secondary | ICD-10-CM | POA: Diagnosis not present

## 2019-07-09 DIAGNOSIS — E785 Hyperlipidemia, unspecified: Secondary | ICD-10-CM | POA: Insufficient documentation

## 2019-07-09 DIAGNOSIS — E049 Nontoxic goiter, unspecified: Secondary | ICD-10-CM | POA: Diagnosis not present

## 2019-07-09 DIAGNOSIS — I350 Nonrheumatic aortic (valve) stenosis: Secondary | ICD-10-CM | POA: Diagnosis not present

## 2019-07-09 NOTE — Progress Notes (Signed)
  Echocardiogram 2D Echocardiogram has been performed.  Darlina Sicilian M 07/09/2019, 11:44 AM

## 2019-07-11 ENCOUNTER — Other Ambulatory Visit: Payer: Self-pay

## 2019-07-11 ENCOUNTER — Ambulatory Visit
Admission: RE | Admit: 2019-07-11 | Discharge: 2019-07-11 | Disposition: A | Discharge status: Home / Self Care | Payer: Medicare Other | Source: Ambulatory Visit | Attending: Neurosurgery | Admitting: Neurosurgery

## 2019-07-11 DIAGNOSIS — D329 Benign neoplasm of meninges, unspecified: Secondary | ICD-10-CM

## 2019-07-11 DIAGNOSIS — D320 Benign neoplasm of cerebral meninges: Secondary | ICD-10-CM | POA: Diagnosis not present

## 2019-07-11 MED ORDER — GADOBENATE DIMEGLUMINE 529 MG/ML IV SOLN
11.0000 mL | Freq: Once | INTRAVENOUS | Status: AC | PRN
  Administered 2019-07-11: 11 mL via INTRAVENOUS

## 2019-07-13 DIAGNOSIS — D329 Benign neoplasm of meninges, unspecified: Secondary | ICD-10-CM | POA: Diagnosis not present

## 2019-07-18 DIAGNOSIS — Z411 Encounter for cosmetic surgery: Secondary | ICD-10-CM | POA: Diagnosis not present

## 2019-07-18 DIAGNOSIS — Z85828 Personal history of other malignant neoplasm of skin: Secondary | ICD-10-CM | POA: Diagnosis not present

## 2019-07-18 DIAGNOSIS — D225 Melanocytic nevi of trunk: Secondary | ICD-10-CM | POA: Diagnosis not present

## 2019-07-18 DIAGNOSIS — L723 Sebaceous cyst: Secondary | ICD-10-CM | POA: Diagnosis not present

## 2019-07-18 DIAGNOSIS — L821 Other seborrheic keratosis: Secondary | ICD-10-CM | POA: Diagnosis not present

## 2019-07-18 DIAGNOSIS — L309 Dermatitis, unspecified: Secondary | ICD-10-CM | POA: Diagnosis not present

## 2019-07-20 ENCOUNTER — Telehealth (HOSPITAL_COMMUNITY): Payer: Self-pay | Admitting: *Deleted

## 2019-07-20 NOTE — Telephone Encounter (Signed)
Pt called for echo results, provided results to pt, she is thankful and appreciative

## 2019-07-23 ENCOUNTER — Other Ambulatory Visit: Payer: Self-pay | Admitting: Internal Medicine

## 2019-07-23 DIAGNOSIS — Z1231 Encounter for screening mammogram for malignant neoplasm of breast: Secondary | ICD-10-CM

## 2019-08-01 ENCOUNTER — Other Ambulatory Visit: Payer: Self-pay | Admitting: Internal Medicine

## 2019-08-01 NOTE — Telephone Encounter (Signed)
[place] Controlled Database Checked Last filled: 06/15/19 # 100 LOV w/you: 06/20/19 Next appt w/you: 09/12/19

## 2019-08-03 ENCOUNTER — Other Ambulatory Visit: Payer: Self-pay | Admitting: Internal Medicine

## 2019-08-03 MED ORDER — DICLOFENAC SODIUM 1 % TD GEL: TRANSDERMAL | 0 refills | Status: DC

## 2019-08-03 NOTE — Telephone Encounter (Signed)
Patient is calling to check the status of her medication refill request.  She stated that the pharmacy still does not have it and she has called several times.  Please advise and call patient back as soon as possible.  CB# [phone]

## 2019-08-03 NOTE — Telephone Encounter (Signed)
Medication Refill - Medication: diclofenac sodium (VOLTAREN) 1 % GEL   Has the patient contacted their pharmacy? Yes.   (Agent: If no, request that the patient contact the pharmacy for the refill.) (Agent: If yes, when and what did the pharmacy advise?)  Preferred Pharmacy (with phone number or street name):  [place], [place]. [phone] (Phone) [phone] (Fax)     Agent: Please be advised that RX refills may take up to 3 business days. We ask that you follow-up with your pharmacy.

## 2019-08-03 NOTE — Telephone Encounter (Signed)
Patient advised that this request is in dr plotnikov's basket to do---this is a controlled substance, and will require MD approval to be sent---most of the time, dr plotnikov works from home some on days he is off---he may be working on this request now---patient is going to also [employer] a message to him to request again

## 2019-08-04 ENCOUNTER — Other Ambulatory Visit: Payer: Self-pay

## 2019-08-04 ENCOUNTER — Ambulatory Visit (INDEPENDENT_AMBULATORY_CARE_PROVIDER_SITE_OTHER): Payer: Medicare Other

## 2019-08-04 DIAGNOSIS — Z23 Encounter for immunization: Secondary | ICD-10-CM | POA: Diagnosis not present

## 2019-08-17 ENCOUNTER — Other Ambulatory Visit: Payer: Self-pay | Admitting: Internal Medicine

## 2019-08-20 ENCOUNTER — Other Ambulatory Visit: Payer: Self-pay

## 2019-08-20 ENCOUNTER — Ambulatory Visit (INDEPENDENT_AMBULATORY_CARE_PROVIDER_SITE_OTHER)
Admission: RE | Admit: 2019-08-20 | Discharge: 2019-08-20 | Disposition: A | Discharge status: Home / Self Care | Payer: Medicare Other | Source: Ambulatory Visit | Attending: Internal Medicine | Admitting: Internal Medicine

## 2019-08-20 ENCOUNTER — Other Ambulatory Visit (INDEPENDENT_AMBULATORY_CARE_PROVIDER_SITE_OTHER): Payer: Medicare Other

## 2019-08-20 ENCOUNTER — Encounter: Payer: Self-pay | Admitting: Internal Medicine

## 2019-08-20 ENCOUNTER — Ambulatory Visit (INDEPENDENT_AMBULATORY_CARE_PROVIDER_SITE_OTHER): Payer: Medicare Other | Admitting: Internal Medicine

## 2019-08-20 VITALS — BP 124/74 | HR 77 | Temp 97.7°F | Ht 62.0 in

## 2019-08-20 DIAGNOSIS — M79671 Pain in right foot: Secondary | ICD-10-CM

## 2019-08-20 DIAGNOSIS — R109 Unspecified abdominal pain: Secondary | ICD-10-CM

## 2019-08-20 DIAGNOSIS — R0781 Pleurodynia: Secondary | ICD-10-CM | POA: Diagnosis not present

## 2019-08-20 DIAGNOSIS — E785 Hyperlipidemia, unspecified: Secondary | ICD-10-CM

## 2019-08-20 DIAGNOSIS — R5382 Chronic fatigue, unspecified: Secondary | ICD-10-CM

## 2019-08-20 DIAGNOSIS — I10 Essential (primary) hypertension: Secondary | ICD-10-CM | POA: Diagnosis not present

## 2019-08-20 DIAGNOSIS — M7989 Other specified soft tissue disorders: Secondary | ICD-10-CM | POA: Diagnosis not present

## 2019-08-20 LAB — CBC WITH DIFFERENTIAL/PLATELET
Basophils Absolute: 0 10*3/uL (ref 0.0–0.1)
Basophils Relative: 1 % (ref 0.0–3.0)
Eosinophils Absolute: 0.2 10*3/uL (ref 0.0–0.7)
Eosinophils Relative: 3.5 % (ref 0.0–5.0)
HCT: 39.9 % (ref 36.0–46.0)
Hemoglobin: 13.4 g/dL (ref 12.0–15.0)
Lymphocytes Relative: 21 % (ref 12.0–46.0)
Lymphs Abs: 1 10*3/uL (ref 0.7–4.0)
MCHC: 33.7 g/dL (ref 30.0–36.0)
MCV: 93.3 fl (ref 78.0–100.0)
Monocytes Absolute: 0.5 10*3/uL (ref 0.1–1.0)
Monocytes Relative: 11.1 % (ref 3.0–12.0)
Neutro Abs: 3.1 10*3/uL (ref 1.4–7.7)
Neutrophils Relative %: 63.4 % (ref 43.0–77.0)
Platelets: 266 10*3/uL (ref 150.0–400.0)
RBC: 4.27 Mil/uL (ref 3.87–5.11)
RDW: 13.2 % (ref 11.5–15.5)
WBC: 4.9 10*3/uL (ref 4.0–10.5)

## 2019-08-20 LAB — BASIC METABOLIC PANEL
BUN: 11 mg/dL (ref 6–23)
CO2: 27 mEq/L (ref 19–32)
Calcium: 9.3 mg/dL (ref 8.4–10.5)
Chloride: 102 mEq/L (ref 96–112)
Creatinine, Ser: 0.66 mg/dL (ref 0.40–1.20)
GFR: 85.93 mL/min (ref >60.00)
Glucose, Bld: 115 mg/dL — ABNORMAL HIGH (ref 70–99)
Potassium: 3.8 mEq/L (ref 3.5–5.1)
Sodium: 137 mEq/L (ref 135–145)

## 2019-08-20 LAB — HEPATIC FUNCTION PANEL
ALT: 16 U/L (ref 0–35)
AST: 18 U/L (ref 0–37)
Albumin: 4.3 g/dL (ref 3.5–5.2)
Alkaline Phosphatase: 34 U/L — ABNORMAL LOW (ref 39–117)
Bilirubin, Direct: 0 mg/dL (ref 0.0–0.3)
Total Bilirubin: 0.5 mg/dL (ref 0.2–1.2)
Total Protein: 7.2 g/dL (ref 6.0–8.3)

## 2019-08-20 LAB — URINALYSIS
Bilirubin Urine: NEGATIVE
Hgb urine dipstick: NEGATIVE
Ketones, ur: NEGATIVE
Leukocytes,Ua: NEGATIVE
Nitrite: NEGATIVE
Specific Gravity, Urine: 1.025 (ref 1.000–1.030)
Total Protein, Urine: NEGATIVE
Urine Glucose: NEGATIVE
Urobilinogen, UA: 0.2 (ref 0.0–1.0)
pH: 5.5 (ref 5.0–8.0)

## 2019-08-20 LAB — TSH: TSH: 1.97 u[IU]/mL (ref 0.35–4.50)

## 2019-08-20 NOTE — Progress Notes (Signed)
Subjective:  Patient ID: [name], female    DOB: Nov 16, 1936  Age: 83 y.o. MRN: [id number]  CC: No chief complaint on file.   HPI [name] presents for R foot pain x months, [name] - worse C/o fatigue C/o R flank and abd pain  Outpatient Medications Prior to Visit  Medication Sig Dispense Refill  . albuterol (PROVENTIL HFA;VENTOLIN HFA) 108 (90 BASE) MCG/ACT inhaler Inhale 2 puffs into the lungs every 6 (six) hours as needed for wheezing. 1 Inhaler 3  . azelastine (ASTELIN) 0.1 % nasal spray Place into both nostrils at bedtime. Use in each nostril as directed    . Azelastine HCl 0.15 % SOLN     . Calcium Carbonate-Vitamin D (CALCIUM-VITAMIN D) 500-200 MG-UNIT per tablet Take 3 tablets by mouth daily.     . Cholecalciferol 4000 units TABS Take 1 tablet by mouth daily.    [name] desonide (DESOWEN) 0.05 % cream Apply topically 2 (two) times daily. 30 g 0  . dexlansoprazole (DEXILANT) 60 MG capsule TAKE (1) CAPSULE DAILY. 30 capsule 6  . diclofenac sodium (VOLTAREN) 1 % GEL APPLY 4 GRAMS 4 TIMES DAILY. 100 g 2  . EPINEPHrine (EPIPEN 2-PAK) 0.3 mg/0.3 mL IJ SOAJ injection Inject 0.3 mLs (0.3 mg total) into the muscle as needed. 1 Device 1  . ipratropium (ATROVENT) 0.06 % nasal spray Place 1 spray into the nose 3 (three) times daily as needed for rhinitis. (Patient taking differently: Place 1 spray into the nose 2 (two) times daily. ) 15 mL 0  . levothyroxine (SYNTHROID) 50 MCG tablet TAKE 1 TABLET ONCE DAILY BEFORE BREAKFAST. 30 tablet 11  . LORazepam (ATIVAN) 1 MG tablet TAKE 1 OR 2 TABLETS TWICE A DAY AS NEEDED FOR ANXIETY. 100 tablet 1  . mometasone (NASONEX) 50 MCG/ACT nasal spray Place 2 sprays into the nose every morning.     . montelukast (SINGULAIR) 10 MG tablet Take 1 tablet (10 mg total) by mouth at bedtime. 90 tablet 1  . NONFORMULARY OR COMPOUNDED ITEM Estradiol 0.02% vaginal cream S:Insert 59ml vaginally twice a week. 90 each 0  . nystatin-triamcinolone ointment (MYCOLOG) Apply 1  application topically 2 (two) times daily. (Patient taking differently: Apply 1 application topically as needed. ) 30 g 1  . SALINE NASAL SPRAY NA Place into the nose.    . simvastatin (ZOCOR) 40 MG tablet TAKE 1 TABLET ONCE DAILY. 90 tablet 0  . triamcinolone cream (KENALOG) 0.1 % Apply 1 application topically 3 (three) times daily. 80 g 3  . VESICARE 10 MG tablet Take 1 tablet by mouth as needed.     . budesonide-formoterol (SYMBICORT) 160-4.5 MCG/ACT inhaler Inhale 2 puffs 2 (two) times daily into the lungs. (Patient taking differently: Inhale 2 puffs into the lungs as needed. ) 1 Inhaler 3   No facility-administered medications prior to visit.     ROS: Review of Systems  Constitutional: Negative for activity change, appetite change, chills, fatigue and unexpected weight change.  HENT: Negative for congestion, mouth sores and sinus pressure.   Eyes: Negative for visual disturbance.  Respiratory: Negative for cough and chest tightness.   Gastrointestinal: Negative for abdominal pain and nausea.  Genitourinary: Negative for difficulty urinating, frequency and vaginal pain.  Musculoskeletal: Positive for arthralgias. Negative for back pain and gait problem.  Skin: Negative for pallor and rash.  Neurological: Negative for dizziness, tremors, weakness, numbness and headaches.  Psychiatric/Behavioral: Negative for confusion, sleep disturbance and suicidal ideas.    Objective:  BP 124/74 (BP Location: Left Arm, Patient Position: Sitting, Cuff Size: Normal)   Pulse 77   Temp 97.7 F (36.5 C) (Oral)   Ht 5\' 2"  (1.575 m)   SpO2 97%   BMI 22.86 kg/m   BP Readings from Last 3 Encounters:  08/20/19 124/74  06/26/19 120/78  06/20/19 120/62    Wt Readings from Last 3 Encounters:  11/06/18 125 lb (56.7 kg)  04/07/17 125 lb (56.7 kg)  04/01/17 122 lb (55.3 kg)    Physical Exam Constitutional:      General: She is not in acute distress.    Appearance: She is well-developed.  HENT:      Head: Normocephalic.     Right Ear: External ear normal.     Left Ear: External ear normal.     Nose: Nose normal.  Eyes:     General:        Right eye: No discharge.        Left eye: No discharge.     Conjunctiva/sclera: Conjunctivae normal.     Pupils: Pupils are equal, round, and reactive to light.  Neck:     Musculoskeletal: Normal range of motion and neck supple.     Thyroid: No thyromegaly.     Vascular: No JVD.     Trachea: No tracheal deviation.  Cardiovascular:     Rate and Rhythm: Normal rate and regular rhythm.     Heart sounds: Normal heart sounds.  Pulmonary:     Effort: No respiratory distress.     Breath sounds: No stridor. No wheezing.  Abdominal:     General: Bowel sounds are normal. There is no distension.     Palpations: Abdomen is soft. There is no mass.     Tenderness: There is no abdominal tenderness. There is no guarding or rebound.  Musculoskeletal:        General: No tenderness.  Lymphadenopathy:     Cervical: No cervical adenopathy.  Skin:    Findings: No erythema or rash.  Neurological:     Cranial Nerves: No cranial nerve deficit.     Motor: No abnormal muscle tone.     Coordination: Coordination normal.     Deep Tendon Reflexes: Reflexes normal.  Psychiatric:        Behavior: Behavior normal.        Thought Content: Thought content normal.        Judgment: Judgment normal.     Lab Results  Component Value Date   WBC 6.0 06/19/2019   HGB 13.5 06/19/2019   HCT 39.9 06/19/2019   PLT 255.0 06/19/2019   GLUCOSE 93 06/19/2019   CHOL 190 06/19/2019   TRIG 206.0 (H) 06/19/2019   HDL 51.10 06/19/2019   LDLDIRECT 109.0 06/19/2019   LDLCALC 98 03/16/2019   ALT 16 06/19/2019   AST 18 06/19/2019   NA 137 06/19/2019   K 4.0 06/19/2019   CL 103 06/19/2019   CREATININE 0.69 06/19/2019   BUN 16 06/19/2019   CO2 24 06/19/2019   TSH 2.09 06/19/2019   HGBA1C 6.0 07/08/2016    Mr Brain W Wo Contrast  Result Date: 07/12/2019 CLINICAL  DATA:  Meningioma.  History of falls. EXAM: MRI HEAD WITHOUT AND WITH CONTRAST TECHNIQUE: Multiplanar, multiecho pulse sequences of the brain and surrounding structures were obtained without and with intravenous contrast. CONTRAST:  14mL MULTIHANCE GADOBENATE DIMEGLUMINE 529 MG/ML IV SOLN COMPARISON:  CT head 12/27/2018 FINDINGS: Brain: Left posterior frontal convexity meningioma measures 16 mm in  diameter. The lesion is calcified and shows dural based enhancement. No edema in the adjacent brain. No other mass lesion. Age appropriate atrophy. Negative for hydrocephalus. Negative for acute infarct. No significant chronic ischemic change. Negative for hemorrhage or fluid collection. Vascular: Normal arterial flow voids Skull and upper cervical spine: No focal skull lesion. Extensive degenerative changes C1-2 with prominent pannus. Three 4 mm anterolisthesis C3-4 Sinuses/Orbits: Paranasal sinuses clear. Bilateral cataract surgery. Other: None IMPRESSION: 16 mm calcified meningioma left frontal convexity unchanged from recent CT. No significant brain edema Negative for acute or chronic ischemia. Electronically Signed   By: Franchot Gallo M.D.   On: 07/12/2019 08:58    Assessment & Plan:   There are no diagnoses linked to this encounter.   No orders of the defined types were placed in this encounter.    Follow-up: No follow-ups on file.  Walker Kehr, MD

## 2019-08-20 NOTE — Patient Instructions (Signed)
  Gluten free trial for 4-6 weeks. OK to use gluten-free bread and gluten-free pasta.    Gluten-Free Diet for Celiac Disease, Adult The gluten-free diet includes all foods that do not contain gluten. Gluten is a protein that is found in wheat, rye, barley, and some other grains. Following the gluten-free diet is the only treatment for people with celiac disease. It helps to prevent damage to the intestines and improves or eliminates the symptoms of celiac disease. Following the gluten-free diet requires some planning. It can be challenging at first, but it gets easier with time and practice. There are more gluten-free options available today than ever before. If you need help finding gluten-free foods or if you have questions, talk with your diet and nutrition specialist (registered dietitian) or your health care provider. What do I need to know about a gluten-free diet?  All fruits, vegetables, and meats are safe to eat and do not contain gluten.  When grocery shopping, start by shopping in the produce, meat, and dairy sections. These sections are more likely to contain gluten-free foods. Then move to the aisles that contain packaged foods if you need to.  Read all food labels. Gluten is often added to foods. Always check the ingredient list and look for warnings, such as "may contain gluten."  Talk with your dietitian or health care provider before taking a gluten-free multivitamin or mineral supplement.  Be aware of gluten-free foods having contact with foods that contain gluten (cross-contamination). This can happen at home and with any processed foods. ? Talk with your health care provider or dietitian about how to reduce the risk of cross-contamination in your home. ? If you have questions about how a food is processed, ask the manufacturer. What key words help to identify gluten? Foods that list any of these key words on the label usually contain gluten:  Wheat, flour, enriched  flour, bromated flour, white flour, durum flour, graham flour, phosphated flour, self-rising flour, semolina, farina, barley (malt), rye, and oats.  Starch, dextrin, modified food starch, or cereal.  Thickening, fillers, or emulsifiers.  Malt flavoring, malt extract, or malt syrup.  Hydrolyzed vegetable protein.  In the [place] packaged foods that are gluten-free are required to be labeled "GF." These foods should be easy to identify and are safe to eat. In the [place] food companies are also required to list common food allergens, including wheat, on their labels. Recommended foods Grains  Amaranth, bean flours, 100% buckwheat flour, corn, millet, nut flours or nut meals, GF oats, quinoa, rice, sorghum, teff, rice wafers, pure cornmeal tortillas, popcorn, and hot cereals made from cornmeal. Hominy, rice, wild rice. Some Asian rice noodles or bean noodles. Arrowroot starch, corn bran, corn flour, corn germ, cornmeal, corn starch, potato flour, potato starch flour, and rice bran. Plain, brown, and sweet rice flours. Rice polish, soy flour, and tapioca starch. Vegetables  All plain fresh, frozen, and canned vegetables. Fruits  All plain fresh, frozen, canned, and dried fruits, and 100% fruit juices. Meats and other protein foods  All fresh beef, pork, poultry, fish, seafood, and eggs. Fish canned in water, oil, brine, or vegetable broth. Plain nuts and seeds, peanut butter. Some lunch meat and some frankfurters. Dried beans, dried peas, and lentils. Dairy  Fresh plain, dry, evaporated, or condensed milk. Cream, butter, sour cream, whipping cream, and most yogurts. Unprocessed cheese, most processed cheeses, some cottage cheese, some cream cheeses. Beverages  Coffee, tea, most herbal teas. Carbonated beverages and some root beers.   Wine, sake, and distilled spirits, such as gin, vodka, and whiskey. Most hard ciders. Fats and oils  Butter, margarine, vegetable oil, hydrogenated butter, olive  oil, shortening, lard, cream, and some mayonnaise. Some commercial salad dressings. Olives. Sweets and desserts  Sugar, honey, some syrups, molasses, jelly, and jam. Plain hard candy, marshmallows, and gumdrops. Pure cocoa powder. Plain chocolate. Custard and some pudding mixes. Gelatin desserts, sorbets, frozen ice pops, and sherbet. Cake, cookies, and other desserts prepared with allowed flours. Some commercial ice creams. Cornstarch, tapioca, and rice puddings. Seasoning and other foods  Some canned or frozen soups. Monosodium glutamate (MSG). Cider, rice, and wine vinegar. Baking soda and baking powder. Cream of tartar. Baking and nutritional yeast. Certain soy sauces made without wheat (ask your dietitian about specific brands that are allowed). Nuts, coconut, and chocolate. Salt, pepper, herbs, spices, flavoring extracts, imitation or artificial flavorings, natural flavorings, and food colorings. Some medicines and supplements. Some lip glosses and other cosmetics. Rice syrups. The items listed may not be a complete list. Talk with your dietitian about what dietary choices are best for you. Foods to avoid Grains  Barley, bran, bulgur, couscous, cracked wheat, [place], farro, graham, malt, matzo, semolina, wheat germ, and all wheat and rye cereals including spelt and kamut. Cereals containing malt as a flavoring, such as rice cereal. Noodles, spaghetti, macaroni, most packaged rice mixes, and all mixes containing wheat, rye, barley, or triticale. Vegetables  Most creamed vegetables and most vegetables canned in sauces. Some commercially prepared vegetables and salads. Fruits  Thickened or prepared fruits and some pie fillings. Some fruit snacks and fruit roll-ups. Meats and other protein foods  Any meat or meat alternative containing wheat, rye, barley, or gluten stabilizers. These are often marinated or packaged meats and lunch meats. Bread-containing products, such as Swiss steak,  croquettes, meatballs, and meatloaf. Most tuna canned in vegetable broth and turkey with hydrolyzed vegetable protein (HVP) injected as part of the basting. Seitan. Imitation fish. Eggs in sauces made from ingredients to avoid. Dairy  Commercial chocolate milk drinks and malted milk. Some non-dairy creamers. Any cheese product containing ingredients to avoid. Beverages  Certain cereal beverages. Beer, ale, malted milk, and some root beers. Some hard ciders. Some instant flavored coffees. Some herbal teas made with barley or with barley malt added. Fats and oils  Some commercial salad dressings. Sour cream containing modified food starch. Sweets and desserts  Some toffees. Chocolate-coated nuts (may be rolled in wheat flour) and some commercial candies and candy bars. Most cakes, cookies, donuts, pastries, and other baked goods. Some commercial ice cream. Ice cream cones. Commercially prepared mixes for cakes, cookies, and other desserts. Bread pudding and other puddings thickened with flour. Products containing brown rice syrup made with barley malt enzyme. Desserts and sweets made with malt flavoring. Seasoning and other foods  Some curry powders, some dry seasoning mixes, some gravy extracts, some meat sauces, some ketchups, some prepared mustards, and horseradish. Certain soy sauces. Malt vinegar. Bouillon and bouillon cubes that contain HVP. Some chip dips, and some chewing gum. Yeast extract. Brewer's yeast. Caramel color. Some medicines and supplements. Some lip glosses and other cosmetics. The items listed may not be a complete list. Talk with your dietitian about what dietary choices are best for you. Summary  Gluten is a protein that is found in wheat, rye, barley, and some other grains. The gluten-free diet includes all foods that do not contain gluten.  If you need help finding gluten-free foods or if   you have questions, talk with your diet and nutrition specialist (registered  dietitian) or your health care provider.  Read all food labels. Gluten is often added to foods. Always check the ingredient list and look for warnings, such as "may contain gluten." This information is not intended to replace advice given to you by your health care provider. Make sure you discuss any questions you have with your health care provider. Document Released: 11/15/2005 Document Revised: 08/30/2016 Document Reviewed: 08/30/2016 Elsevier Interactive Patient Education  2018 Elsevier Inc[employer]   

## 2019-08-22 ENCOUNTER — Telehealth: Payer: Self-pay | Admitting: *Deleted

## 2019-08-22 NOTE — Telephone Encounter (Signed)
Deductible $198 ($124met)  OOP MAX N/A  Annual exam 06/26/2019 TF  Calcium 9.3       Date 08/20/2019  Upcoming dental procedures   Prior Authorization needed NO  Pt estimated Cost $0   10/29/2019 @11 :30    Coverage Details: This is a [employer] F Plan and it covers the medicare Part B deductible and 100% of the excess charges

## 2019-08-26 ENCOUNTER — Encounter: Payer: Self-pay | Admitting: Internal Medicine

## 2019-08-28 ENCOUNTER — Ambulatory Visit
Admission: RE | Admit: 2019-08-28 | Discharge: 2019-08-28 | Disposition: A | Discharge status: Home / Self Care | Payer: Medicare Other | Source: Ambulatory Visit | Attending: Internal Medicine | Admitting: Internal Medicine

## 2019-08-28 DIAGNOSIS — K8020 Calculus of gallbladder without cholecystitis without obstruction: Secondary | ICD-10-CM | POA: Diagnosis not present

## 2019-08-30 ENCOUNTER — Other Ambulatory Visit: Payer: Self-pay | Admitting: Internal Medicine

## 2019-08-30 DIAGNOSIS — K8020 Calculus of gallbladder without cholecystitis without obstruction: Secondary | ICD-10-CM

## 2019-09-03 ENCOUNTER — Ambulatory Visit: Payer: Medicare Other

## 2019-09-03 ENCOUNTER — Encounter: Payer: Self-pay | Admitting: Podiatry

## 2019-09-03 ENCOUNTER — Ambulatory Visit (INDEPENDENT_AMBULATORY_CARE_PROVIDER_SITE_OTHER): Payer: Medicare Other | Admitting: Podiatry

## 2019-09-03 ENCOUNTER — Other Ambulatory Visit: Payer: Self-pay

## 2019-09-03 DIAGNOSIS — M7671 Peroneal tendinitis, right leg: Secondary | ICD-10-CM

## 2019-09-03 DIAGNOSIS — M2042 Other hammer toe(s) (acquired), left foot: Secondary | ICD-10-CM

## 2019-09-03 DIAGNOSIS — M25571 Pain in right ankle and joints of right foot: Secondary | ICD-10-CM

## 2019-09-03 DIAGNOSIS — M79671 Pain in right foot: Secondary | ICD-10-CM

## 2019-09-03 NOTE — Progress Notes (Signed)
Subjective:   Patient ID: [name], female   DOB: 83 y.o.   MRN: [id number]   HPI Patient states she is having a lot of pain on the outside of her right foot and it is been present for several months.  Does not remember specific injury and also has digital concerns with long-term hammertoe formation   ROS      Objective:  Physical Exam  Neurovascular status intact with patient noted to have inflammation pain lateral side right fifth metatarsal and also concerned about some calcification of arteries and does have digital deformities     Assessment:  Acute peroneal tendinitis right with calcification of arteries which I am not concerned about that should not be a long-term problem but she does make sure that she sees a cardiologist but I do not see the point of Dopplers at this time.  I also do think she is got continued digital deformities     Plan:  Reviewed all conditions and she brought an x-ray with her and today I did careful injection of the peroneal tendon insertion 3 mg Dexasone Kenalog 5 mg Xylocaine advised on ice and if symptoms persist will be seen back

## 2019-09-04 ENCOUNTER — Telehealth: Payer: Self-pay | Admitting: Internal Medicine

## 2019-09-05 NOTE — Telephone Encounter (Signed)
Called pt left msg w/date and time of appt.

## 2019-09-05 NOTE — Telephone Encounter (Signed)
Pt scheduled to see Dr. Henrene Pastor tomorrow at 8:40am, please let pt know about appt.

## 2019-09-06 ENCOUNTER — Encounter: Payer: Self-pay | Admitting: Internal Medicine

## 2019-09-06 ENCOUNTER — Encounter: Payer: Self-pay | Admitting: Gynecology

## 2019-09-06 ENCOUNTER — Ambulatory Visit (INDEPENDENT_AMBULATORY_CARE_PROVIDER_SITE_OTHER): Payer: Medicare Other | Admitting: Internal Medicine

## 2019-09-06 VITALS — BP 118/70 | HR 76 | Temp 97.9°F | Ht 61.5 in

## 2019-09-06 DIAGNOSIS — K5901 Slow transit constipation: Secondary | ICD-10-CM | POA: Diagnosis not present

## 2019-09-06 DIAGNOSIS — K8020 Calculus of gallbladder without cholecystitis without obstruction: Secondary | ICD-10-CM

## 2019-09-06 DIAGNOSIS — K219 Gastro-esophageal reflux disease without esophagitis: Secondary | ICD-10-CM | POA: Diagnosis not present

## 2019-09-06 DIAGNOSIS — R140 Abdominal distension (gaseous): Secondary | ICD-10-CM

## 2019-09-06 NOTE — Progress Notes (Signed)
HISTORY OF PRESENT ILLNESS:  [name] is a 83 y.o. female with GERD, IBS, and chronic functional abdominal complaints.  She was last seen in the office November 06, 2018 regarding IBS, increased intestinal gas, and GERD.  See that dictation.  She presents today with concerns over her chronic abdominal pain and recently identified gallstones on ultrasound.  Other issues today include problems with belching, breakthrough pyrosis, regurgitation, and constipation.  She tells me that she stopped using her MiraLAX regularly and has since had more issues with constipation.  She also tells me that she discontinued her gluten-free diet and is experiencing more bloating discomfort.  She has had dull right mid abdomen right upper quadrant discomfort for many many years.  This remains unchanged.  She is tells me that she is experiencing some this afternoon.  It does not wake her.  May or may not be affected by meals.  No radiation of the discomfort.  No fevers or jaundice.  No acceleration of symptoms.  Review of blood work from August 20, 2019 shows normal liver tests.  Normal CBC with hemoglobin 13.4.  Review of abdominal ultrasound from August 28, 2019 shows multiple gallstones.  Common bile duct 6.3 mm.  Otherwise unremarkable.  Last complete colonoscopy July 2013 was normal except for a few diverticula and small hemorrhoids  REVIEW OF SYSTEMS:  All non-GI ROS negative unless otherwise stated in the HPI except for arthritis, occasional excessive urination  Past Medical History:  Diagnosis Date  . Allergic rhinitis   . Atrophic vaginitis   . Baker's cyst    Left-Dr. Aluisio  . Cataract    Dr. Katy Fitch  . Colon polyps   . Fibroid   . Gastritis    chronic  . GERD (gastroesophageal reflux disease)   . Heart murmur   . Hemorrhoids   . Hyperlipidemia   . IBS (irritable bowel syndrome)   . MVP (mitral valve prolapse)    Antibiotics required for dental procedures  . OA (osteoarthritis)   .  Osteoporosis 06/2018   T score -2.2 stable on Prolia  . Overactive bladder   . Thyroid disease   . Thyroid nodule   . Torn meniscus    bilateral    Past Surgical History:  Procedure Laterality Date  . CATARACT EXTRACTION, BILATERAL    . TONSILLECTOMY      Social History [name]  reports that she has quit smoking. She has never used smokeless tobacco. She reports current alcohol use of about 5.0 standard drinks of alcohol per week. She reports that she does not use drugs.  family history includes Congestive Heart Failure in her mother; Heart attack in her brother; Heart disease in her mother; Hypertension in her mother; Osteoporosis in her mother; Pancreatic cancer in her father.  Allergies  Allergen Reactions  . Bee Venom Itching, Swelling and Rash    Itching, swelling and rash with bee stings, patient has epi pen  . Doxycycline     ??  . Neosporin [Neomycin-Bacitracin Zn-Polymyx]        PHYSICAL EXAMINATION: Vital signs: BP 118/70   Pulse 76   Temp 97.9 F (36.6 C)   Ht 5' 1.5" (1.562 m)   SpO2 96%   BMI 23.24 kg/m   Constitutional: generally well-appearing, no acute distress Psychiatric: alert and oriented x3, cooperative Eyes: extraocular movements intact, anicteric, conjunctiva pink Mouth: oral pharynx moist, no lesions Neck: supple no lymphadenopathy Cardiovascular: heart regular rate and rhythm, no murmur Lungs: clear to auscultation  bilaterally Abdomen: soft, nontender, nondistended, no obvious ascites, no peritoneal signs, normal bowel sounds, no organomegaly Rectal: Omitted Extremities: no clubbing, cyanosis, or lower extremity edema bilaterally Skin: no lesions on visible extremities Neuro: No focal deficits.  Cranial nerves intact  ASSESSMENT:  1.  Chronic abdominal discomfort.  Unchanged from the past.  Not convinced this is related to gallstones, though could. 2.  Cholelithiasis 3.  Normal laboratories including liver test 4.  GERD with  occasional breakthrough and regurgitation 5.  Chronic constipation 6.  Bloating and belching in a patient with IBS   PLAN:  1.  At this point we mutually decided to watch her abdominal complaints.  If symptoms accelerate or become more classic for symptomatic cholelithiasis, then she is agreeable to surgical referral.  She will keep me posted 2.  Resume MiraLAX for constipation 3.  Resume gluten-free diet as this helped her IBS symptoms, though she is not a true celiac 4.  Reflux precautions 5.  Continue Dexilant 6.  An acid such as Mylanta on demand for breakthrough pyrosis 7.  Routine GI follow-up 1 year.  Sooner if needed 25-minute spent face-to-face with the patient.  Greater than 50% the time used for counseling regarding her multiple GI problems as listed above and their management

## 2019-09-06 NOTE — Patient Instructions (Signed)
Please follow up as needed 

## 2019-09-09 ENCOUNTER — Encounter: Payer: Self-pay | Admitting: Internal Medicine

## 2019-09-09 DIAGNOSIS — K8020 Calculus of gallbladder without cholecystitis without obstruction: Secondary | ICD-10-CM | POA: Insufficient documentation

## 2019-09-09 HISTORY — DX: Calculus of gallbladder without cholecystitis without obstruction: K80.20

## 2019-09-10 ENCOUNTER — Other Ambulatory Visit: Payer: Self-pay | Admitting: Internal Medicine

## 2019-09-10 DIAGNOSIS — M79671 Pain in right foot: Secondary | ICD-10-CM

## 2019-09-11 ENCOUNTER — Other Ambulatory Visit: Payer: Self-pay

## 2019-09-11 ENCOUNTER — Ambulatory Visit
Admission: RE | Admit: 2019-09-11 | Discharge: 2019-09-11 | Disposition: A | Discharge status: Home / Self Care | Payer: Medicare Other | Source: Ambulatory Visit | Attending: Internal Medicine | Admitting: Internal Medicine

## 2019-09-11 DIAGNOSIS — Z1231 Encounter for screening mammogram for malignant neoplasm of breast: Secondary | ICD-10-CM | POA: Diagnosis not present

## 2019-09-12 ENCOUNTER — Ambulatory Visit (INDEPENDENT_AMBULATORY_CARE_PROVIDER_SITE_OTHER): Payer: Medicare Other | Admitting: Internal Medicine

## 2019-09-12 ENCOUNTER — Other Ambulatory Visit: Payer: Self-pay

## 2019-09-12 ENCOUNTER — Encounter: Payer: Self-pay | Admitting: Internal Medicine

## 2019-09-12 VITALS — BP 122/72 | HR 75 | Temp 98.0°F | Ht 61.5 in

## 2019-09-12 DIAGNOSIS — G4700 Insomnia, unspecified: Secondary | ICD-10-CM

## 2019-09-12 DIAGNOSIS — J3089 Other allergic rhinitis: Secondary | ICD-10-CM | POA: Diagnosis not present

## 2019-09-12 DIAGNOSIS — J301 Allergic rhinitis due to pollen: Secondary | ICD-10-CM | POA: Diagnosis not present

## 2019-09-12 DIAGNOSIS — F419 Anxiety disorder, unspecified: Secondary | ICD-10-CM | POA: Diagnosis not present

## 2019-09-12 DIAGNOSIS — E785 Hyperlipidemia, unspecified: Secondary | ICD-10-CM | POA: Diagnosis not present

## 2019-09-12 DIAGNOSIS — K8020 Calculus of gallbladder without cholecystitis without obstruction: Secondary | ICD-10-CM

## 2019-09-12 DIAGNOSIS — T63441D Toxic effect of venom of bees, accidental (unintentional), subsequent encounter: Secondary | ICD-10-CM | POA: Diagnosis not present

## 2019-09-12 DIAGNOSIS — J4520 Mild intermittent asthma, uncomplicated: Secondary | ICD-10-CM | POA: Diagnosis not present

## 2019-09-12 MED ORDER — PANCRELIPASE (LIP-PROT-AMYL) 36000-114000 UNITS PO CPEP: 36000.0000 [IU] | ORAL_CAPSULE | Freq: Three times a day (TID) | ORAL | 3 refills | Status: DC

## 2019-09-12 NOTE — Assessment & Plan Note (Signed)
Discussed -- Per Dr Henrene Pastor: "At this point we mutually decided to watch her abdominal complaints.  If symptoms accelerate or become more classic for symptomatic cholelithiasis, then she is agreeable to surgical referral.  She will keep me posted"  More pain, mild. Surg ref suggested Creon samples for loose stools

## 2019-09-12 NOTE — Assessment & Plan Note (Signed)
Lorazepam prn  Potential benefits of a long term benzodiazepines  use as well as potential risks  and complications were explained to the patient and were aknowledged.  

## 2019-09-12 NOTE — Progress Notes (Signed)
Subjective:  Patient ID: [name], female    DOB: August 30, 1936  Age: 83 y.o. MRN: [id number]  CC: No chief complaint on file.   HPI Kobee Sikkema presents for RUQ pain - recurrent mild pain; GERD, OAB   Outpatient Medications Prior to Visit  Medication Sig Dispense Refill  . albuterol (PROVENTIL HFA;VENTOLIN HFA) 108 (90 BASE) MCG/ACT inhaler Inhale 2 puffs into the lungs every 6 (six) hours as needed for wheezing. 1 Inhaler 3  . azelastine (ASTELIN) 0.1 % nasal spray Place into both nostrils at bedtime. Use in each nostril as directed    . Azelastine HCl 0.15 % SOLN     . Calcium Carbonate-Vitamin D (CALCIUM-VITAMIN D) 500-200 MG-UNIT per tablet Take 3 tablets by mouth daily.     . Cholecalciferol 4000 units TABS Take 1 tablet by mouth daily.    [name] desonide (DESOWEN) 0.05 % cream Apply topically 2 (two) times daily. 30 g 0  . dexlansoprazole (DEXILANT) 60 MG capsule TAKE (1) CAPSULE DAILY. 90 capsule 3  . diclofenac sodium (VOLTAREN) 1 % GEL APPLY 4 GRAMS 4 TIMES DAILY. 100 g 2  . EPINEPHrine (EPIPEN 2-PAK) 0.3 mg/0.3 mL IJ SOAJ injection Inject 0.3 mLs (0.3 mg total) into the muscle as needed. 1 Device 1  . ipratropium (ATROVENT) 0.06 % nasal spray Place 1 spray into the nose 3 (three) times daily as needed for rhinitis. (Patient taking differently: Place 1 spray into the nose 2 (two) times daily. ) 15 mL 0  . levothyroxine (SYNTHROID) 50 MCG tablet TAKE 1 TABLET ONCE DAILY BEFORE BREAKFAST. 30 tablet 11  . LORazepam (ATIVAN) 1 MG tablet TAKE 1 OR 2 TABLETS TWICE A DAY AS NEEDED FOR ANXIETY. 100 tablet 1  . mometasone (NASONEX) 50 MCG/ACT nasal spray Place 2 sprays into the nose every morning.     . montelukast (SINGULAIR) 10 MG tablet Take 1 tablet (10 mg total) by mouth at bedtime. 90 tablet 1  . NONFORMULARY OR COMPOUNDED ITEM Estradiol 0.02% vaginal cream S:Insert 22ml vaginally twice a week. 90 each 0  . nystatin-triamcinolone ointment (MYCOLOG) Apply 1 application topically 2 (two)  times daily. (Patient taking differently: Apply 1 application topically as needed. ) 30 g 1  . SALINE NASAL SPRAY NA Place into the nose.    . simvastatin (ZOCOR) 40 MG tablet TAKE 1 TABLET ONCE DAILY. 90 tablet 0  . triamcinolone cream (KENALOG) 0.1 % Apply 1 application topically 3 (three) times daily. 80 g 3  . VESICARE 10 MG tablet Take 1 tablet by mouth as needed.     . budesonide-formoterol (SYMBICORT) 160-4.5 MCG/ACT inhaler Inhale 2 puffs 2 (two) times daily into the lungs. (Patient taking differently: Inhale 2 puffs into the lungs as needed. ) 1 Inhaler 3   No facility-administered medications prior to visit.     ROS: Review of Systems  Constitutional: Negative for activity change, appetite change, chills, fatigue and unexpected weight change.  HENT: Negative for congestion, mouth sores and sinus pressure.   Eyes: Negative for visual disturbance.  Respiratory: Negative for cough and chest tightness.   Gastrointestinal: Positive for abdominal pain. Negative for nausea.  Genitourinary: Negative for difficulty urinating, frequency and vaginal pain.  Musculoskeletal: Negative for back pain and gait problem.  Skin: Negative for pallor and rash.  Neurological: Negative for dizziness, tremors, weakness, numbness and headaches.  Psychiatric/Behavioral: Negative for confusion, sleep disturbance and suicidal ideas.    Objective:  BP 122/72 (BP Location: Left Arm, Patient Position: Sitting,  Cuff Size: Normal)   Pulse 75   Temp 98 F (36.7 C) (Oral)   Ht 5' 1.5" (1.562 m)   SpO2 97%   BMI 23.24 kg/m   BP Readings from Last 3 Encounters:  09/12/19 122/72  09/06/19 118/70  08/20/19 124/74    Wt Readings from Last 3 Encounters:  11/06/18 125 lb (56.7 kg)  04/07/17 125 lb (56.7 kg)  04/01/17 122 lb (55.3 kg)    Physical Exam Constitutional:      General: She is not in acute distress.    Appearance: She is well-developed.  HENT:     Head: Normocephalic.     Right Ear:  External ear normal.     Left Ear: External ear normal.     Nose: Nose normal.  Eyes:     General:        Right eye: No discharge.        Left eye: No discharge.     Conjunctiva/sclera: Conjunctivae normal.     Pupils: Pupils are equal, round, and reactive to light.  Neck:     Musculoskeletal: Normal range of motion and neck supple.     Thyroid: No thyromegaly.     Vascular: No JVD.     Trachea: No tracheal deviation.  Cardiovascular:     Rate and Rhythm: Normal rate and regular rhythm.     Heart sounds: Normal heart sounds.  Pulmonary:     Effort: No respiratory distress.     Breath sounds: No stridor. No wheezing.  Abdominal:     General: Bowel sounds are normal. There is no distension.     Palpations: Abdomen is soft. There is no mass.     Tenderness: There is no abdominal tenderness. There is no guarding or rebound.  Musculoskeletal:        General: No tenderness.  Lymphadenopathy:     Cervical: No cervical adenopathy.  Skin:    Findings: No erythema or rash.  Neurological:     Mental Status: She is oriented to person, place, and time.     Cranial Nerves: No cranial nerve deficit.     Motor: No abnormal muscle tone.     Coordination: Coordination normal.     Deep Tendon Reflexes: Reflexes normal.  Psychiatric:        Behavior: Behavior normal.        Thought Content: Thought content normal.        Judgment: Judgment normal.   sensitive in RUQ  Lab Results  Component Value Date   WBC 4.9 08/20/2019   HGB 13.4 08/20/2019   HCT 39.9 08/20/2019   PLT 266.0 08/20/2019   GLUCOSE 115 (H) 08/20/2019   CHOL 190 06/19/2019   TRIG 206.0 (H) 06/19/2019   HDL 51.10 06/19/2019   LDLDIRECT 109.0 06/19/2019   LDLCALC 98 03/16/2019   ALT 16 08/20/2019   AST 18 08/20/2019   NA 137 08/20/2019   K 3.8 08/20/2019   CL 102 08/20/2019   CREATININE 0.66 08/20/2019   BUN 11 08/20/2019   CO2 27 08/20/2019   TSH 1.97 08/20/2019   HGBA1C 6.0 07/08/2016    Mm 3d Screen  Breast Bilateral  Result Date: 09/12/2019 CLINICAL DATA:  Screening. EXAM: DIGITAL SCREENING BILATERAL MAMMOGRAM WITH TOMO AND CAD COMPARISON:  Previous exam(s). ACR Breast Density Category b: There are scattered areas of fibroglandular density. FINDINGS: There are no findings suspicious for malignancy. Images were processed with CAD. IMPRESSION: No mammographic evidence of malignancy. A result letter  of this screening mammogram will be mailed directly to the patient. RECOMMENDATION: Screening mammogram in one year. (Code:SM-B-01Y) BI-RADS CATEGORY  1: Negative. Electronically Signed   By: Kristopher Oppenheim M.D.   On: 09/12/2019 12:37    Assessment & Plan:   There are no diagnoses linked to this encounter.   No orders of the defined types were placed in this encounter.    Follow-up: No follow-ups on file.  Walker Kehr, MD

## 2019-09-12 NOTE — Patient Instructions (Signed)
Per Dr Henrene Pastor: "At this point we mutually decided to watch her abdominal complaints.  If symptoms accelerate or become more classic for symptomatic cholelithiasis, then she is agreeable to surgical referral.  She will keep me posted"

## 2019-09-12 NOTE — Assessment & Plan Note (Signed)
Lexapro - not taking Weighted blanket Lorazepam prn  Potential benefits of a long term benzodiazepines  use as well as potential risks  and complications were explained to the patient and were aknowledged.

## 2019-09-15 ENCOUNTER — Encounter: Payer: Self-pay | Admitting: Internal Medicine

## 2019-09-15 NOTE — Assessment & Plan Note (Signed)
Labs Simvastatin 

## 2019-09-17 ENCOUNTER — Ambulatory Visit (HOSPITAL_COMMUNITY)
Admission: RE | Admit: 2019-09-17 | Discharge: 2019-09-17 | Disposition: A | Discharge status: Home / Self Care | Payer: Medicare Other | Source: Ambulatory Visit | Attending: Cardiology | Admitting: Cardiology

## 2019-09-17 ENCOUNTER — Other Ambulatory Visit: Payer: Self-pay

## 2019-09-17 DIAGNOSIS — M79671 Pain in right foot: Secondary | ICD-10-CM

## 2019-09-19 ENCOUNTER — Ambulatory Visit: Payer: Medicare Other | Admitting: Internal Medicine

## 2019-09-20 ENCOUNTER — Other Ambulatory Visit: Payer: Self-pay | Admitting: Internal Medicine

## 2019-09-21 ENCOUNTER — Other Ambulatory Visit: Payer: Self-pay | Admitting: Internal Medicine

## 2019-10-02 DIAGNOSIS — H52203 Unspecified astigmatism, bilateral: Secondary | ICD-10-CM | POA: Diagnosis not present

## 2019-10-02 DIAGNOSIS — Z961 Presence of intraocular lens: Secondary | ICD-10-CM | POA: Diagnosis not present

## 2019-10-02 DIAGNOSIS — H10413 Chronic giant papillary conjunctivitis, bilateral: Secondary | ICD-10-CM | POA: Diagnosis not present

## 2019-10-02 DIAGNOSIS — H5000 Unspecified esotropia: Secondary | ICD-10-CM | POA: Diagnosis not present

## 2019-10-04 ENCOUNTER — Other Ambulatory Visit: Payer: Self-pay

## 2019-10-04 ENCOUNTER — Encounter: Payer: Self-pay | Admitting: Podiatry

## 2019-10-04 ENCOUNTER — Ambulatory Visit (INDEPENDENT_AMBULATORY_CARE_PROVIDER_SITE_OTHER): Payer: Medicare Other | Admitting: Podiatry

## 2019-10-04 DIAGNOSIS — M7671 Peroneal tendinitis, right leg: Secondary | ICD-10-CM | POA: Diagnosis not present

## 2019-10-10 ENCOUNTER — Ambulatory Visit (INDEPENDENT_AMBULATORY_CARE_PROVIDER_SITE_OTHER): Payer: Medicare Other | Admitting: Psychology

## 2019-10-10 ENCOUNTER — Other Ambulatory Visit: Payer: Self-pay | Admitting: Internal Medicine

## 2019-10-10 DIAGNOSIS — F4320 Adjustment disorder, unspecified: Secondary | ICD-10-CM | POA: Diagnosis not present

## 2019-10-18 ENCOUNTER — Other Ambulatory Visit: Payer: Self-pay

## 2019-10-18 ENCOUNTER — Encounter: Payer: Self-pay | Admitting: Podiatry

## 2019-10-18 ENCOUNTER — Ambulatory Visit (INDEPENDENT_AMBULATORY_CARE_PROVIDER_SITE_OTHER): Payer: Medicare Other | Admitting: Podiatry

## 2019-10-18 ENCOUNTER — Ambulatory Visit: Payer: Medicare Other | Admitting: Podiatry

## 2019-10-18 DIAGNOSIS — M7671 Peroneal tendinitis, right leg: Secondary | ICD-10-CM

## 2019-10-18 DIAGNOSIS — M779 Enthesopathy, unspecified: Secondary | ICD-10-CM | POA: Diagnosis not present

## 2019-10-20 NOTE — Progress Notes (Signed)
Subjective:   Patient ID: [name], female   DOB: 83 y.o.   MRN: [id number]   HPI Patient presents stating he is doing much better my pain is down I am still using my boot   ROS      Objective:  Physical Exam  Neurovascular status intact with diminished discomfort in the dorsum of the right foot and into the peroneal complex with mild discomfort only upon deep palpation     Assessment:  Improvement of several different areas of pain 1 being extensor tendinitis the other being peroneal tendinitis     Plan:  Reviewed all conditions and placed into air fracture walker again which I want her to use for the next few weeks and gradually can reduce and I explained what to do if symptoms persist

## 2019-10-22 DIAGNOSIS — R350 Frequency of micturition: Secondary | ICD-10-CM | POA: Diagnosis not present

## 2019-10-22 DIAGNOSIS — R3989 Other symptoms and signs involving the genitourinary system: Secondary | ICD-10-CM | POA: Diagnosis not present

## 2019-10-22 DIAGNOSIS — N952 Postmenopausal atrophic vaginitis: Secondary | ICD-10-CM | POA: Diagnosis not present

## 2019-10-22 DIAGNOSIS — R3915 Urgency of urination: Secondary | ICD-10-CM | POA: Diagnosis not present

## 2019-10-29 ENCOUNTER — Other Ambulatory Visit: Payer: Self-pay

## 2019-10-29 ENCOUNTER — Ambulatory Visit (INDEPENDENT_AMBULATORY_CARE_PROVIDER_SITE_OTHER): Payer: Medicare Other | Admitting: Gynecology

## 2019-10-29 DIAGNOSIS — M810 Age-related osteoporosis without current pathological fracture: Secondary | ICD-10-CM | POA: Diagnosis not present

## 2019-10-29 MED ORDER — DENOSUMAB 60 MG/ML ~~LOC~~ SOSY
60.0000 mg | PREFILLED_SYRINGE | Freq: Once | SUBCUTANEOUS | Status: AC
  Administered 2019-10-29: 60 mg via SUBCUTANEOUS

## 2019-11-05 ENCOUNTER — Other Ambulatory Visit: Payer: Self-pay | Admitting: Internal Medicine

## 2019-11-07 ENCOUNTER — Ambulatory Visit: Payer: Medicare Other | Admitting: Gynecology

## 2019-11-19 ENCOUNTER — Other Ambulatory Visit: Payer: Self-pay | Admitting: Internal Medicine

## 2019-11-26 ENCOUNTER — Other Ambulatory Visit: Payer: Self-pay

## 2019-11-26 NOTE — Telephone Encounter (Signed)
Pharmacy calling to check status. Please advise

## 2019-12-03 ENCOUNTER — Other Ambulatory Visit: Payer: Self-pay | Admitting: Internal Medicine

## 2019-12-12 ENCOUNTER — Ambulatory Visit: Payer: Medicare Other | Attending: Internal Medicine

## 2019-12-12 DIAGNOSIS — Z23 Encounter for immunization: Secondary | ICD-10-CM | POA: Insufficient documentation

## 2019-12-12 NOTE — Progress Notes (Signed)
   Covid-19 Vaccination Clinic  Name:  [name]    MRN: [id number] DOB: 1936-04-08  12/12/2019  [name] was observed post Covid-19 immunization for 15 minutes without incidence. She was provided with Vaccine Information Sheet and instruction to access the V-Safe system.   [name] was instructed to call 911 with any severe reactions post vaccine: [name] Difficulty breathing  . Swelling of your face and throat  . A fast heartbeat  . A bad rash all over your body  . Dizziness and weakness    Immunizations Administered    Name Date Dose VIS Date Route   Pfizer COVID-19 Vaccine 12/12/2019 11:59 AM 0.3 mL 11/09/2019 Intramuscular   Manufacturer: [employer], [employer]   Lot: [id number]   [place]: [id number]

## 2019-12-17 ENCOUNTER — Other Ambulatory Visit: Payer: Self-pay | Admitting: Internal Medicine

## 2019-12-18 ENCOUNTER — Other Ambulatory Visit: Payer: Self-pay | Admitting: Internal Medicine

## 2019-12-18 NOTE — Telephone Encounter (Signed)
PROLIA GIVEN 10/29/2019 NEXT INJECTION 04/18/2020

## 2019-12-19 ENCOUNTER — Ambulatory Visit (INDEPENDENT_AMBULATORY_CARE_PROVIDER_SITE_OTHER): Payer: Medicare Other | Admitting: Internal Medicine

## 2019-12-19 ENCOUNTER — Other Ambulatory Visit: Payer: Self-pay

## 2019-12-19 ENCOUNTER — Encounter: Payer: Self-pay | Admitting: Internal Medicine

## 2019-12-19 DIAGNOSIS — G8929 Other chronic pain: Secondary | ICD-10-CM

## 2019-12-19 DIAGNOSIS — G4700 Insomnia, unspecified: Secondary | ICD-10-CM | POA: Diagnosis not present

## 2019-12-19 DIAGNOSIS — F419 Anxiety disorder, unspecified: Secondary | ICD-10-CM

## 2019-12-19 DIAGNOSIS — I10 Essential (primary) hypertension: Secondary | ICD-10-CM

## 2019-12-19 DIAGNOSIS — M545 Low back pain: Secondary | ICD-10-CM | POA: Diagnosis not present

## 2019-12-19 MED ORDER — LORAZEPAM 1 MG PO TABS: ORAL_TABLET | ORAL | 2 refills | Status: DC

## 2019-12-19 NOTE — Assessment & Plan Note (Addendum)
Lorazepam prn - tolerance has developed  Potential benefits of a long term benzodiazepines  use as well as potential risks  and complications were explained to the patient and were aknowledged. Stress, depression is worse

## 2019-12-19 NOTE — Progress Notes (Signed)
Subjective:  Patient ID: [name], female    DOB: 1936-08-05  Age: 84 y.o. MRN: [id number]  CC: No chief complaint on file.   HPI [name] presents for dyslipidemia, HTN F/u OAB, R foot pain, anxiety  C/o insomnia - worse Had 2 COVID 19 shots [facility] to [facility] in March  Outpatient Medications Prior to Visit  Medication Sig Dispense Refill  . albuterol (PROVENTIL HFA;VENTOLIN HFA) 108 (90 BASE) MCG/ACT inhaler Inhale 2 puffs into the lungs every 6 (six) hours as needed for wheezing. 1 Inhaler 3  . azelastine (ASTELIN) 0.1 % nasal spray Place into both nostrils at bedtime. Use in each nostril as directed    . Azelastine HCl 0.15 % SOLN     . Calcium Carbonate-Vitamin D (CALCIUM-VITAMIN D) 500-200 MG-UNIT per tablet Take 3 tablets by mouth daily.     . Cholecalciferol 4000 units TABS Take 1 tablet by mouth daily.    [name] desonide (DESOWEN) 0.05 % cream Apply topically 2 (two) times daily. 30 g 0  . dexlansoprazole (DEXILANT) 60 MG capsule TAKE (1) CAPSULE DAILY. 90 capsule 3  . diclofenac sodium (VOLTAREN) 1 % GEL APPLY 4 GRAMS 4 TIMES DAILY. 100 g 0  . diclofenac Sodium (VOLTAREN) 1 % GEL APPLY 4 GRAMS 4 TIMES DAILY. 100 g 0  . EPINEPHrine (EPIPEN 2-PAK) 0.3 mg/0.3 mL IJ SOAJ injection Inject 0.3 mLs (0.3 mg total) into the muscle as needed. 1 Device 1  . ipratropium (ATROVENT) 0.06 % nasal spray Place 1 spray into the nose 3 (three) times daily as needed for rhinitis. (Patient taking differently: Place 1 spray into the nose 2 (two) times daily. ) 15 mL 0  . levothyroxine (SYNTHROID) 50 MCG tablet TAKE 1 TABLET ONCE DAILY BEFORE BREAKFAST. 30 tablet 11  . lipase/protease/amylase (CREON) 36000 UNITS CPEP capsule Take 1 capsule (36,000 Units total) by mouth 3 (three) times daily before meals. Take with the first bite of food 180 capsule 3  . LORazepam (ATIVAN) 1 MG tablet TAKE 1 OR 2 TABLETS TWICE A DAY AS NEEDED FOR ANXIETY. 100 tablet 1  . mometasone (NASONEX) 50 MCG/ACT nasal  spray Place 2 sprays into the nose every morning.     . montelukast (SINGULAIR) 10 MG tablet Take 1 tablet (10 mg total) by mouth at bedtime. 90 tablet 1  . NONFORMULARY OR COMPOUNDED ITEM Estradiol 0.02% vaginal cream S:Insert 20ml vaginally twice a week. 90 each 0  . nystatin-triamcinolone ointment (MYCOLOG) Apply 1 application topically 2 (two) times daily. (Patient taking differently: Apply 1 application topically as needed. ) 30 g 1  . SALINE NASAL SPRAY NA Place into the nose.    . simvastatin (ZOCOR) 40 MG tablet TAKE 1 TABLET ONCE DAILY. 90 tablet 0  . triamcinolone cream (KENALOG) 0.1 % Apply 1 application topically 3 (three) times daily. 80 g 3  . VESICARE 10 MG tablet Take 1 tablet by mouth as needed.     . budesonide-formoterol (SYMBICORT) 160-4.5 MCG/ACT inhaler Inhale 2 puffs 2 (two) times daily into the lungs. (Patient taking differently: Inhale 2 puffs into the lungs as needed. ) 1 Inhaler 3   No facility-administered medications prior to visit.    ROS: Review of Systems  Constitutional: Positive for fatigue. Negative for activity change, appetite change, chills and unexpected weight change.  HENT: Negative for congestion, mouth sores and sinus pressure.   Eyes: Negative for visual disturbance.  Respiratory: Negative for cough and chest tightness.   Gastrointestinal: Negative for  abdominal pain and nausea.  Genitourinary: Positive for frequency. Negative for difficulty urinating and vaginal pain.  Musculoskeletal: Positive for arthralgias. Negative for back pain and gait problem.  Skin: Negative for pallor and rash.  Neurological: Negative for dizziness, tremors, weakness, numbness and headaches.  Psychiatric/Behavioral: Negative for confusion and sleep disturbance.    Objective:  BP 122/70 (BP Location: Left Arm, Patient Position: Sitting, Cuff Size: Normal)   Pulse 91   Temp 98.2 F (36.8 C) (Oral)   Ht 5' 1.5" (1.562 m)   SpO2 96%   BMI 23.24 kg/m   BP Readings  from Last 3 Encounters:  12/19/19 122/70  09/12/19 122/72  09/06/19 118/70    Wt Readings from Last 3 Encounters:  11/06/18 125 lb (56.7 kg)  04/07/17 125 lb (56.7 kg)  04/01/17 122 lb (55.3 kg)    Physical Exam Constitutional:      General: She is not in acute distress.    Appearance: She is well-developed.  HENT:     Head: Normocephalic.     Right Ear: External ear normal.     Left Ear: External ear normal.     Nose: Nose normal.  Eyes:     General:        Right eye: No discharge.        Left eye: No discharge.     Conjunctiva/sclera: Conjunctivae normal.     Pupils: Pupils are equal, round, and reactive to light.  Neck:     Thyroid: No thyromegaly.     Vascular: No JVD.     Trachea: No tracheal deviation.  Cardiovascular:     Rate and Rhythm: Normal rate and regular rhythm.     Heart sounds: Normal heart sounds.  Pulmonary:     Effort: No respiratory distress.     Breath sounds: No stridor. No wheezing.  Abdominal:     General: Bowel sounds are normal. There is no distension.     Palpations: Abdomen is soft. There is no mass.     Tenderness: There is no abdominal tenderness. There is no guarding or rebound.  Musculoskeletal:        General: No tenderness.     Cervical back: Normal range of motion and neck supple.  Lymphadenopathy:     Cervical: No cervical adenopathy.  Skin:    Findings: No erythema or rash.  Neurological:     Cranial Nerves: No cranial nerve deficit.     Motor: No abnormal muscle tone.     Coordination: Coordination normal.     Deep Tendon Reflexes: Reflexes normal.  Psychiatric:        Behavior: Behavior normal.        Thought Content: Thought content normal.        Judgment: Judgment normal.    OA deformities - hands, feet Lab Results  Component Value Date   WBC 4.9 08/20/2019   HGB 13.4 08/20/2019   HCT 39.9 08/20/2019   PLT 266.0 08/20/2019   GLUCOSE 115 (H) 08/20/2019   CHOL 190 06/19/2019   TRIG 206.0 (H) 06/19/2019   HDL  51.10 06/19/2019   LDLDIRECT 109.0 06/19/2019   LDLCALC 98 03/16/2019   ALT 16 08/20/2019   AST 18 08/20/2019   NA 137 08/20/2019   K 3.8 08/20/2019   CL 102 08/20/2019   CREATININE 0.66 08/20/2019   BUN 11 08/20/2019   CO2 27 08/20/2019   TSH 1.97 08/20/2019   HGBA1C 6.0 07/08/2016    VAS [place] LOWER EXT  ART SEG MULTI (SEGMENTALS & LE RAYNAUDS)  Result Date: 09/18/2019 LOWER EXTREMITY DOPPLER STUDY High Risk Factors: Hypertension, hyperlipidemia, past history of smoking. Other Factors: Patient complains of right foot pain and at time pain in the                right calf for the past few months. She was told by her physician                that he saw plaque build up on an x-ray. She denies any                claudication symptoms and no rest pain.  Performing Technologist: Wilkie Aye RVT  Examination Guidelines: A complete evaluation includes at minimum, Doppler waveform signals and systolic blood pressure reading at the level of bilateral brachial, anterior tibial, and posterior tibial arteries, when vessel segments are accessible. Bilateral testing is considered an integral part of a complete examination. Photoelectric Plethysmograph (PPG) waveforms and toe systolic pressure readings are included as required and additional duplex testing as needed. Limited examinations for reoccurring indications may be performed as noted.  ABI Findings: +---------+------------------+-----+---------+--------+ Right    Rt Pressure (mmHg)IndexWaveform Comment  +---------+------------------+-----+---------+--------+ Brachial 124                                      +---------+------------------+-----+---------+--------+ CFA                             triphasic         +---------+------------------+-----+---------+--------+ Popliteal                       triphasic         +---------+------------------+-----+---------+--------+ ATA      143               1.14 triphasic          +---------+------------------+-----+---------+--------+ PTA      149               1.19 triphasic         +---------+------------------+-----+---------+--------+ PERO     140               1.12 triphasic         +---------+------------------+-----+---------+--------+ Great Toe119               0.95 Normal            +---------+------------------+-----+---------+--------+ +---------+------------------+-----+---------+-------+ Left     Lt Pressure (mmHg)IndexWaveform Comment +---------+------------------+-----+---------+-------+ Brachial 125                                     +---------+------------------+-----+---------+-------+ CFA                             triphasic        +---------+------------------+-----+---------+-------+ Popliteal                       triphasic        +---------+------------------+-----+---------+-------+ ATA      135               1.08 triphasic        +---------+------------------+-----+---------+-------+ PTA  155               1.24 triphasic        +---------+------------------+-----+---------+-------+ PERO     137               1.10 triphasic        +---------+------------------+-----+---------+-------+ Great Toe99                0.79 Normal           +---------+------------------+-----+---------+-------+ +-------+-----------+-----------+ ABI/TBIToday's ABIToday's TBI +-------+-----------+-----------+ Right  1.19       0.95        +-------+-----------+-----------+ Left   1.24       0.79        +-------+-----------+-----------+  Summary: Right: Resting right ankle-brachial index is within normal range. No evidence of significant right lower extremity arterial disease. The right toe-brachial index is normal. Left: Resting left ankle-brachial index is within normal range. No evidence of significant left lower extremity arterial disease. The left toe-brachial index is normal.  *See table(s) above for  measurements and observations.  Electronically signed by Larae Grooms MD on 09/18/2019 at 12:39:36 PM.    Final     Assessment & Plan:    Follow-up: No follow-ups on file.  Walker Kehr, MD

## 2019-12-19 NOTE — Assessment & Plan Note (Signed)
Doing well 

## 2019-12-19 NOTE — Assessment & Plan Note (Signed)
Worse Lorazepam prn  Potential benefits of a long term benzodiazepines  use as well as potential risks  and complications were explained to the patient and were aknowledged.  

## 2019-12-19 NOTE — Assessment & Plan Note (Signed)
NAS diet 

## 2019-12-22 ENCOUNTER — Other Ambulatory Visit: Payer: Self-pay | Admitting: Internal Medicine

## 2019-12-26 ENCOUNTER — Other Ambulatory Visit (INDEPENDENT_AMBULATORY_CARE_PROVIDER_SITE_OTHER): Payer: Medicare Other

## 2019-12-26 ENCOUNTER — Other Ambulatory Visit: Payer: Self-pay

## 2019-12-26 DIAGNOSIS — J301 Allergic rhinitis due to pollen: Secondary | ICD-10-CM | POA: Diagnosis not present

## 2019-12-26 DIAGNOSIS — K8020 Calculus of gallbladder without cholecystitis without obstruction: Secondary | ICD-10-CM | POA: Diagnosis not present

## 2019-12-26 DIAGNOSIS — J3089 Other allergic rhinitis: Secondary | ICD-10-CM | POA: Diagnosis not present

## 2019-12-26 DIAGNOSIS — E785 Hyperlipidemia, unspecified: Secondary | ICD-10-CM | POA: Diagnosis not present

## 2019-12-26 DIAGNOSIS — J4520 Mild intermittent asthma, uncomplicated: Secondary | ICD-10-CM | POA: Diagnosis not present

## 2019-12-26 DIAGNOSIS — T63441D Toxic effect of venom of bees, accidental (unintentional), subsequent encounter: Secondary | ICD-10-CM | POA: Diagnosis not present

## 2019-12-26 LAB — LIPID PANEL
Cholesterol: 156 mg/dL (ref 0–200)
HDL: 40.7 mg/dL (ref >39.00)
LDL Cholesterol: 90 mg/dL (ref 0–99)
NonHDL: 115.27
Total CHOL/HDL Ratio: 4
Triglycerides: 124 mg/dL (ref 0.0–149.0)
VLDL: 24.8 mg/dL (ref 0.0–40.0)

## 2019-12-26 LAB — BASIC METABOLIC PANEL
BUN: 14 mg/dL (ref 6–23)
CO2: 26 mEq/L (ref 19–32)
Calcium: 9.2 mg/dL (ref 8.4–10.5)
Chloride: 101 mEq/L (ref 96–112)
Creatinine, Ser: 0.62 mg/dL (ref 0.40–1.20)
GFR: 92.27 mL/min (ref >60.00)
Glucose, Bld: 113 mg/dL — ABNORMAL HIGH (ref 70–99)
Potassium: 4.1 mEq/L (ref 3.5–5.1)
Sodium: 136 mEq/L (ref 135–145)

## 2019-12-26 LAB — CBC WITH DIFFERENTIAL/PLATELET
Basophils Absolute: 0.1 10*3/uL (ref 0.0–0.1)
Basophils Relative: 0.6 % (ref 0.0–3.0)
Eosinophils Absolute: 0.2 10*3/uL (ref 0.0–0.7)
Eosinophils Relative: 2.9 % (ref 0.0–5.0)
HCT: 36.5 % (ref 36.0–46.0)
Hemoglobin: 12.4 g/dL (ref 12.0–15.0)
Lymphocytes Relative: 12.7 % (ref 12.0–46.0)
Lymphs Abs: 1.1 10*3/uL (ref 0.7–4.0)
MCHC: 34 g/dL (ref 30.0–36.0)
MCV: 93.1 fl (ref 78.0–100.0)
Monocytes Absolute: 0.8 10*3/uL (ref 0.1–1.0)
Monocytes Relative: 9.9 % (ref 3.0–12.0)
Neutro Abs: 6.2 10*3/uL (ref 1.4–7.7)
Neutrophils Relative %: 73.9 % (ref 43.0–77.0)
Platelets: 463 10*3/uL — ABNORMAL HIGH (ref 150.0–400.0)
RBC: 3.92 Mil/uL (ref 3.87–5.11)
RDW: 12.9 % (ref 11.5–15.5)
WBC: 8.4 10*3/uL (ref 4.0–10.5)

## 2019-12-26 LAB — HEPATIC FUNCTION PANEL
ALT: 16 U/L (ref 0–35)
AST: 17 U/L (ref 0–37)
Albumin: 3.9 g/dL (ref 3.5–5.2)
Alkaline Phosphatase: 63 U/L (ref 39–117)
Bilirubin, Direct: 0.1 mg/dL (ref 0.0–0.3)
Total Bilirubin: 0.3 mg/dL (ref 0.2–1.2)
Total Protein: 7.5 g/dL (ref 6.0–8.3)

## 2019-12-26 LAB — TSH: TSH: 2.14 u[IU]/mL (ref 0.35–4.50)

## 2019-12-27 ENCOUNTER — Other Ambulatory Visit: Payer: Self-pay | Admitting: Internal Medicine

## 2019-12-27 DIAGNOSIS — R739 Hyperglycemia, unspecified: Secondary | ICD-10-CM

## 2019-12-27 DIAGNOSIS — I10 Essential (primary) hypertension: Secondary | ICD-10-CM

## 2019-12-27 DIAGNOSIS — D473 Essential (hemorrhagic) thrombocythemia: Secondary | ICD-10-CM

## 2019-12-27 DIAGNOSIS — D75839 Thrombocytosis, unspecified: Secondary | ICD-10-CM

## 2019-12-31 ENCOUNTER — Ambulatory Visit: Payer: Medicare Other | Attending: Internal Medicine

## 2019-12-31 ENCOUNTER — Other Ambulatory Visit: Payer: Self-pay

## 2019-12-31 DIAGNOSIS — Z23 Encounter for immunization: Secondary | ICD-10-CM | POA: Insufficient documentation

## 2019-12-31 NOTE — Progress Notes (Signed)
   Covid-19 Vaccination Clinic  Name:  [name]    MRN: [id number] DOB: 10-23-1936  12/31/2019  [name] was observed post Covid-19 immunization for 15 minutes without incidence. She was provided with Vaccine Information Sheet and instruction to access the V-Safe system.   [name] was instructed to call 911 with any severe reactions post vaccine: [name] Difficulty breathing  . Swelling of your face and throat  . A fast heartbeat  . A bad rash all over your body  . Dizziness and weakness    Immunizations Administered    Name Date Dose VIS Date Route   Pfizer COVID-19 Vaccine 12/31/2019 10:34 AM 0.3 mL 11/09/2019 Intramuscular   Manufacturer: [place]   Lot: [id number]   [place]: [id number]

## 2020-01-01 ENCOUNTER — Encounter: Payer: Self-pay | Admitting: Women's Health

## 2020-01-01 ENCOUNTER — Ambulatory Visit (INDEPENDENT_AMBULATORY_CARE_PROVIDER_SITE_OTHER): Payer: Medicare Other | Admitting: Women's Health

## 2020-01-01 VITALS — BP 122/80

## 2020-01-01 DIAGNOSIS — Z1239 Encounter for other screening for malignant neoplasm of breast: Secondary | ICD-10-CM | POA: Diagnosis not present

## 2020-01-01 DIAGNOSIS — Z0000 Encounter for general adult medical examination without abnormal findings: Secondary | ICD-10-CM

## 2020-01-01 NOTE — Progress Notes (Signed)
84 year old G3 P2 for breast exam.  Reports does not do SBEs and just wanted to make sure breast exam normal.  Denies nipple discharge, changes in appearance or pain.  3D mammogram 08/2019 normal at breast center.  On vaginal estrogen with no bleeding.  Osteoporosis on Prolia tolerating well.  Denies vaginal discharge, urinary symptoms, abdominal pain or fever.  Recently completed an antibiotic for sinus infection and is doing better.  Healthy lifestyle of regular exercise.  Medical problems include hypertension, hypothyroidism, IBS, hypercholesteremia, OAB, asthma, and arthritis.  No breast cancer history.  Exam: Appears well.  Breast examined sitting and lying position without retractions, dimpling, nipple discharge, palpable nodules or masses.  Normal breast exam  Plan: Reviewed and reassured concerning normality of breast exam.  Continue annual screening mammograms.  Encouraged to continue healthy lifestyle of regular exercise, Pilates.  Worked with a trainer 3 times a week prior to [employer] to get back to that.

## 2020-01-02 ENCOUNTER — Ambulatory Visit: Payer: Medicare Other | Admitting: Women's Health

## 2020-01-02 ENCOUNTER — Other Ambulatory Visit: Payer: Self-pay

## 2020-01-02 ENCOUNTER — Encounter: Payer: Self-pay | Admitting: Gastroenterology

## 2020-01-02 ENCOUNTER — Ambulatory Visit (INDEPENDENT_AMBULATORY_CARE_PROVIDER_SITE_OTHER): Payer: Medicare Other | Admitting: Gastroenterology

## 2020-01-02 VITALS — BP 104/64 | HR 88 | Temp 97.8°F | Ht 59.75 in

## 2020-01-02 DIAGNOSIS — R142 Eructation: Secondary | ICD-10-CM

## 2020-01-02 DIAGNOSIS — K219 Gastro-esophageal reflux disease without esophagitis: Secondary | ICD-10-CM

## 2020-01-02 DIAGNOSIS — K5909 Other constipation: Secondary | ICD-10-CM

## 2020-01-02 DIAGNOSIS — R1011 Right upper quadrant pain: Secondary | ICD-10-CM | POA: Diagnosis not present

## 2020-01-02 DIAGNOSIS — K589 Irritable bowel syndrome without diarrhea: Secondary | ICD-10-CM | POA: Diagnosis not present

## 2020-01-02 MED ORDER — FAMOTIDINE 40 MG PO TABS: 40.0000 mg | ORAL_TABLET | Freq: Every day | ORAL | 3 refills | Status: DC

## 2020-01-02 MED ORDER — PANTOPRAZOLE SODIUM 40 MG PO TBEC: 40.0000 mg | DELAYED_RELEASE_TABLET | Freq: Two times a day (BID) | ORAL | 3 refills | Status: DC

## 2020-01-02 NOTE — Patient Instructions (Signed)
Discontinue Dexilant.   Start Pantoprazole -Take twice daily. 30 mins before breakfast and 30 mins before dinner.   Start Miralax 17 g(1 capful) dissolved in at least 8 ounces of water/juice daily.   Start Pepcid 40mg  - once at bedtime.   Call back in 3-4 weeks and ask for Linda-RN(Dr.Perry)- give updates of symptoms.   We have sent the following medications to your pharmacy for you to pick up at your convenience: Pantoprazole, Pepcid  If you are age 82 or older, your body mass index should be between 23-30. Your Body mass index is 24.62 kg/m. If this is out of the aforementioned range listed, please consider follow up with your Primary Care Provider.  If you are age 65 or younger, your body mass index should be between 19-25. Your Body mass index is 24.62 kg/m. If this is out of the aformentioned range listed, please consider follow up with your Primary Care Provider.   Thank you for choosing me and [facility].  Janett Billow Zehr-PA

## 2020-01-02 NOTE — Progress Notes (Signed)
01/02/2020 [name] [id number] [birth year]   HISTORY OF PRESENT ILLNESS: This is a pleasant 84 year old female who is a patient of Dr. Blanch Media.  She follows with him for GERD, IBS, chronic right upper quadrant abdominal pain.  She was last seen by him on September 06, 2019.  Please see that dictation.  She is here today with really the same complaints that he addressed back in October.  She continues to report intermittent right upper quadrant abdominal pain and frequent belching.  She says that this morning she has not yet had anything to eat or drink, but is still having belching and also reflux or heartburn despite taking her Dexilant daily.  She says that her allergist also recently placed her on Pepcid 20 mg at bedtime.  Also reports constipation, but she has not yet resumed her MiraLAX.  She previously discontinued her gluten-free diet, but has resumed that as recommended by Dr. Henrene Pastor.  Last colonoscopy July 2013 was normal except for a few diverticula and small hemorrhoids.  Abdominal ultrasound from August 28, 2019 shows multiple gallstones with a common bile duct of 6.3 mm.  Otherwise unremarkable.  Labs have been unremarkable.    Past Medical History:  Diagnosis Date  . Allergic rhinitis   . Atrophic vaginitis   . Baker's cyst    Left-Dr. Aluisio  . Cataract    Dr. Katy Fitch  . Colon polyps   . Fibroid   . Gastritis    chronic  . GERD (gastroesophageal reflux disease)   . Heart murmur   . Hemorrhoids   . Hyperlipidemia   . IBS (irritable bowel syndrome)   . MVP (mitral valve prolapse)    Antibiotics required for dental procedures  . OA (osteoarthritis)   . Osteoporosis 06/2018   T score -2.2 stable on Prolia  . Overactive bladder   . Thyroid disease   . Thyroid nodule   . Torn meniscus    bilateral   Past Surgical History:  Procedure Laterality Date  . CATARACT EXTRACTION, BILATERAL    . TONSILLECTOMY      reports that she has quit smoking. She has never used  smokeless tobacco. She reports current alcohol use of about 5.0 standard drinks of alcohol per week. She reports that she does not use drugs. family history includes Congestive Heart Failure in her mother; Heart attack in her brother; Heart disease in her mother; Hypertension in her mother; Osteoporosis in her mother; Pancreatic cancer in her father. Allergies  Allergen Reactions  . Bee Venom Itching, Swelling and Rash    Itching, swelling and rash with bee stings, patient has epi pen  . Doxycycline     ??  . Neosporin [Neomycin-Bacitracin Zn-Polymyx]       Outpatient Encounter Medications as of 01/02/2020  Medication Sig  . albuterol (PROVENTIL HFA;VENTOLIN HFA) 108 (90 BASE) MCG/ACT inhaler Inhale 2 puffs into the lungs every 6 (six) hours as needed for wheezing.  [name] azelastine (ASTELIN) 0.1 % nasal spray Place into both nostrils at bedtime. Use in each nostril as directed  . Azelastine HCl 0.15 % SOLN   . Calcium Carbonate-Vitamin D (CALCIUM-VITAMIN D) 500-200 MG-UNIT per tablet Take 3 tablets by mouth daily.   . Cholecalciferol 4000 units TABS Take 1 tablet by mouth daily.  [name] desonide (DESOWEN) 0.05 % cream Apply topically 2 (two) times daily.  . diclofenac Sodium (VOLTAREN) 1 % GEL APPLY 4 GRAMS 4 TIMES DAILY.  [name] EPINEPHrine (EPIPEN 2-PAK) 0.3 mg/0.3 mL IJ  SOAJ injection Inject 0.3 mLs (0.3 mg total) into the muscle as needed.  [name] ipratropium (ATROVENT) 0.06 % nasal spray Place 1 spray into the nose 3 (three) times daily as needed for rhinitis. (Patient taking differently: Place 1 spray into the nose 2 (two) times daily. )  . levocetirizine (XYZAL) 5 MG tablet Take 5 mg by mouth every evening.  [name] levothyroxine (SYNTHROID) 50 MCG tablet TAKE 1 TABLET ONCE DAILY BEFORE BREAKFAST.  [name] lipase/protease/amylase (CREON) 36000 UNITS CPEP capsule Take 1 capsule (36,000 Units total) by mouth 3 (three) times daily before meals. Take with the first bite of food  . LORazepam (ATIVAN) 1 MG tablet Take  2-3 mg at hs and 1-2 mg in the daytime prn  . mometasone (NASONEX) 50 MCG/ACT nasal spray Place 2 sprays into the nose every morning.   . montelukast (SINGULAIR) 10 MG tablet Take 1 tablet (10 mg total) by mouth at bedtime.  . NONFORMULARY OR COMPOUNDED ITEM Estradiol 0.02% vaginal cream S:Insert 34ml vaginally twice a week.  . nystatin-triamcinolone ointment (MYCOLOG) Apply 1 application topically 2 (two) times daily. (Patient taking differently: Apply 1 application topically as needed. )  . SALINE NASAL SPRAY NA Place into the nose.  . simvastatin (ZOCOR) 40 MG tablet TAKE 1 TABLET ONCE DAILY.  [name] triamcinolone cream (KENALOG) 0.1 % Apply 1 application topically 3 (three) times daily.  . VESICARE 10 MG tablet Take 1 tablet by mouth as needed.   . [DISCONTINUED] dexlansoprazole (DEXILANT) 60 MG capsule TAKE (1) CAPSULE DAILY.  . [DISCONTINUED] famotidine (PEPCID) 10 MG tablet Take 10 mg by mouth at bedtime.  . budesonide-formoterol (SYMBICORT) 160-4.5 MCG/ACT inhaler Inhale 2 puffs 2 (two) times daily into the lungs. (Patient not taking: Reported on 01/02/2020)  . famotidine (PEPCID) 40 MG tablet Take 1 tablet (40 mg total) by mouth at bedtime.  . pantoprazole (PROTONIX) 40 MG tablet Take 1 tablet (40 mg total) by mouth 2 (two) times daily before a meal.   No facility-administered encounter medications on file as of 01/02/2020.     REVIEW OF SYSTEMS  : All other systems reviewed and negative except where noted in the History of Present Illness.   PHYSICAL EXAM: BP 104/64 (BP Location: Left Arm, Patient Position: Sitting, Cuff Size: Normal)   Pulse 88   Temp 97.8 F (36.6 C)   Ht 4' 11.75" (1.518 m) Comment: height measured without shoes  BMI 24.62 kg/m  General: Well developed white female in no acute distress Head: Normocephalic and atraumatic Eyes:  Sclerae anicteric, conjunctiva pink. Ears: Normal auditory acuity Lungs: Clear throughout to auscultation; no increased WOB. Heart:  Regular rate and rhythm; no M/R/G. Abdomen: Soft, non-distended.  BS present.  Minimal RUQ TTP. Musculoskeletal: Symmetrical with no gross deformities  Skin: No lesions on visible extremities Extremities: No edema  Neurological: Alert oriented x 4, grossly non-focal Psychological:  Alert and cooperative. Normal mood and affect  ASSESSMENT AND PLAN: *Chronic RUQ abdominal pain: Unchanged from the past, ? due to gallstones. *GERD with breakthrough symptoms: Has been on Dexilant for years.  We will discontinue that for now and try pantoprazole 40 mg twice daily for a few weeks and will increase her Pepcid from 20 mg at bedtime to 40 mg at bedtime.  Both of those prescriptions were sent to her pharmacy.  If symptoms improved after a few weeks then we can try to back down on her therapy.  It seems that pantoprazole may be the only PPI other than Zegerid  that she has not tried in the past. *Chronic constipation: Discontinued her MiraLAX and has not resumed that.  She was advised to do so and is agreeable.  Advised that she can mix it and some other type of liquid other than water if she desires. *Belching:  ?  If this is related to GERD or if it is also possibly related to any type of gallbladder issue.   **She will call back with an update on her symptoms in 3 to 4 weeks.  CC:  Plotnikov, Evie Lacks, MD

## 2020-01-02 NOTE — Progress Notes (Signed)
Thanks for seeing [name]

## 2020-01-11 DIAGNOSIS — J3089 Other allergic rhinitis: Secondary | ICD-10-CM | POA: Diagnosis not present

## 2020-01-11 DIAGNOSIS — T63441D Toxic effect of venom of bees, accidental (unintentional), subsequent encounter: Secondary | ICD-10-CM | POA: Diagnosis not present

## 2020-01-11 DIAGNOSIS — J301 Allergic rhinitis due to pollen: Secondary | ICD-10-CM | POA: Diagnosis not present

## 2020-01-11 DIAGNOSIS — J4520 Mild intermittent asthma, uncomplicated: Secondary | ICD-10-CM | POA: Diagnosis not present

## 2020-01-13 ENCOUNTER — Other Ambulatory Visit: Payer: Self-pay | Admitting: Internal Medicine

## 2020-01-16 ENCOUNTER — Telehealth: Payer: Self-pay

## 2020-01-16 DIAGNOSIS — R29898 Other symptoms and signs involving the musculoskeletal system: Secondary | ICD-10-CM

## 2020-01-16 DIAGNOSIS — R269 Unspecified abnormalities of gait and mobility: Secondary | ICD-10-CM

## 2020-01-16 DIAGNOSIS — M1990 Unspecified osteoarthritis, unspecified site: Secondary | ICD-10-CM

## 2020-01-16 NOTE — Telephone Encounter (Signed)
What diagnosis would you like me to use for reason pt needs lift chart?

## 2020-01-17 NOTE — Telephone Encounter (Signed)
Osteoarthritis, leg weakness, gait disorder.  Thanks

## 2020-01-18 NOTE — Addendum Note (Signed)
Addended by: Karren Cobble on: 01/18/2020 10:45 AM   Modules accepted: Orders

## 2020-01-18 NOTE — Telephone Encounter (Signed)
Pt notified ready to pick up

## 2020-01-23 IMAGING — MR MRI HEAD WITHOUT AND WITH CONTRAST
13 series · 48 of 48 positions shown · IV contrast (multihance)
Comparison: CT head 12/27/2018

CLINICAL DATA: Meningioma.  History of falls.

EXAM:
MRI HEAD WITHOUT AND WITH CONTRAST
TECHNIQUE: Multiplanar, multiecho pulse sequences of the brain and surrounding
structures were obtained without and with intravenous contrast.
CONTRAST:  11mL MULTIHANCE GADOBENATE DIMEGLUMINE 529 MG/ML IV SOLN

[Series 2: t1_se_sag · sagittal · 5.0mm · 0.45mm/px · 2 of 21 slices shown]
[im 1/21]
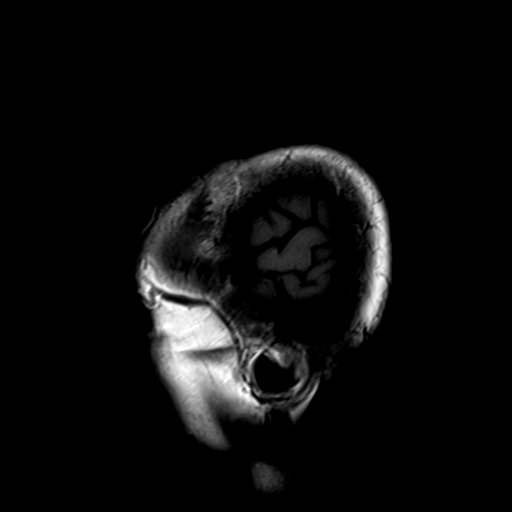
[im 21/21]
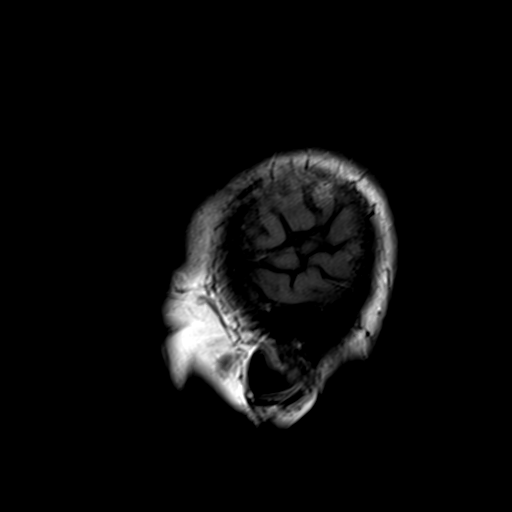

[Series 3: ep2d_diff_3 · axial · 3.0mm · 1.80mm/px · z∈[-63,+78]mm · 7 of 95 slices shown]
[im 1/95]
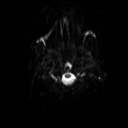
[im 16/95]
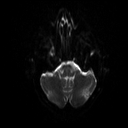
[im 32/95]
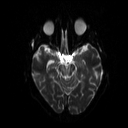
[im 48/95]
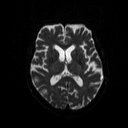
[im 63/95]
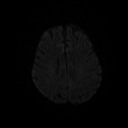
[im 79/95]
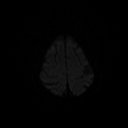
[im 95/95]
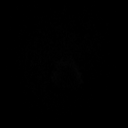

[Series 4: ep2d_diff_3_adc · axial · 3.0mm · 1.80mm/px · z∈[-63,+78]mm · 3 of 47 slices shown]
[im 1/47]
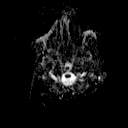
[im 24/47]
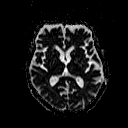
[im 47/47]
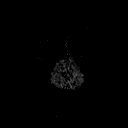

[Series 5: ep2d_diff_cor · coronal · 5.0mm · 1.77mm/px · 3 of 52 slices shown]
[im 1/52]
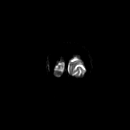
[im 26/52]
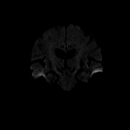
[im 52/52]
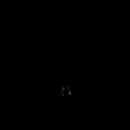

[Series 6: ep2d_diff_cor_adc · coronal · 5.0mm · 1.77mm/px · 2 of 26 slices shown]
[im 1/26]
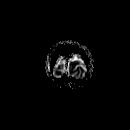
[im 26/26]
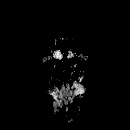

[Series 7: FLAIR · axial · 3.0mm · 0.45mm/px · 1 of 16 slices shown]
[im 1/16]
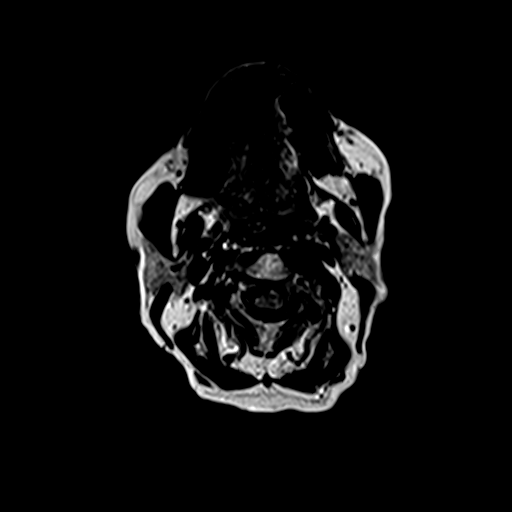

[Series 8: t2_tse_tra · axial · 5.0mm · 0.60mm/px · z∈[-69,+81]mm · 2 of 26 slices shown]
[im 1/26]
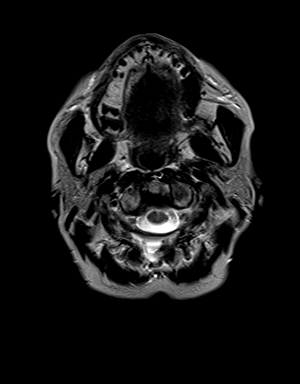
[im 26/26]
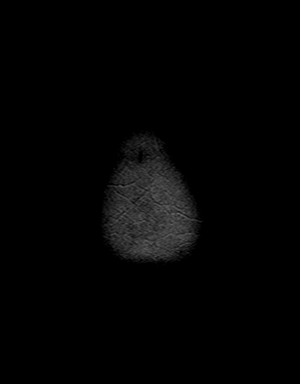

[Series 10: swi_images · axial · 2.0mm · 0.90mm/px · z∈[-75,+83]mm · 5 of 80 slices shown]
[im 1/80]
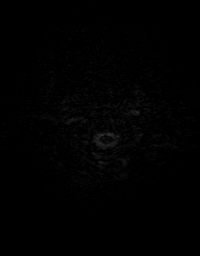
[im 20/80]
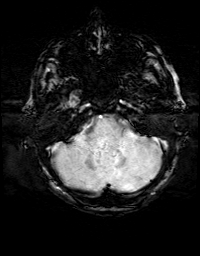
[im 40/80]
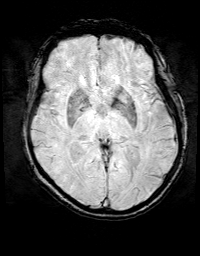
[im 60/80]
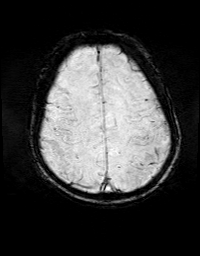
[im 80/80]
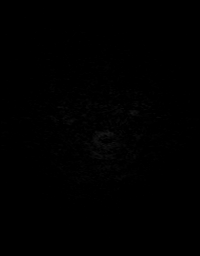

[Series 11: t1_mpr_tra · axial · 1.0mm · 0.72mm/px · z∈[-63,+80]mm · 9 of 144 slices shown]
[im 1/144]
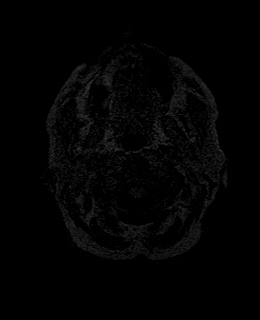
[im 18/144]
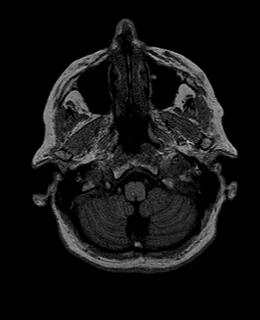
[im 36/144]
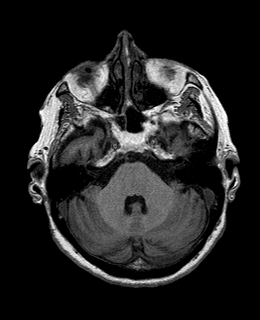
[im 54/144]
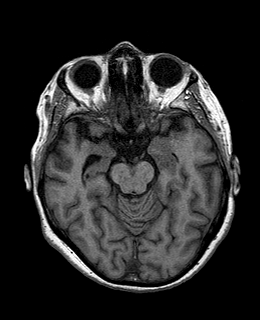
[im 72/144]
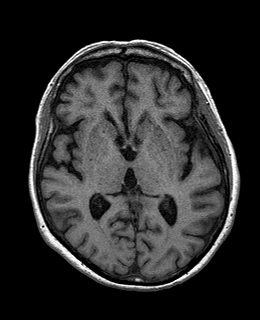
[im 90/144]
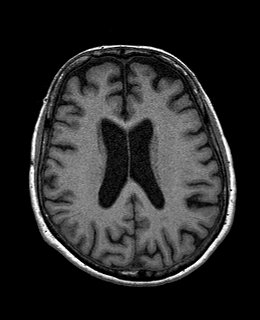
[im 108/144]
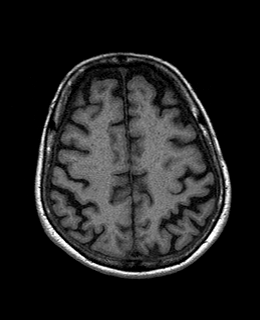
[im 126/144]
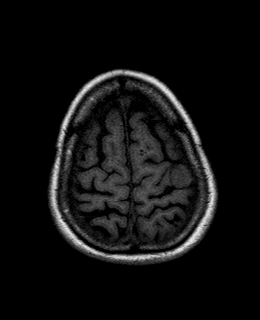
[im 144/144]
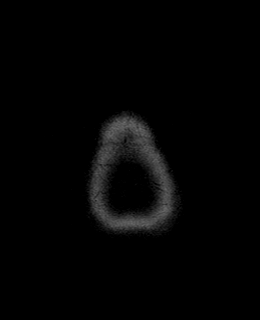

[Series 12: T2 post-contrast · coronal · 5.0mm · 0.45mm/px · 2 of 30 slices shown]
[im 1/30]
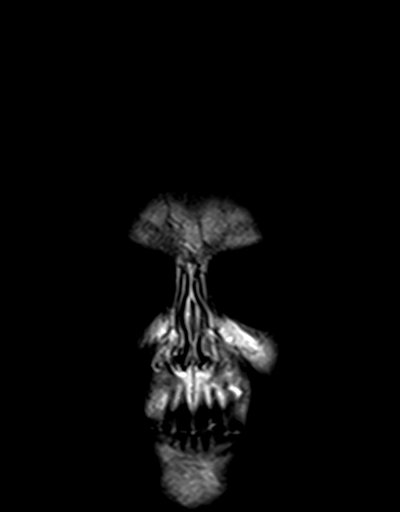
[im 30/30]
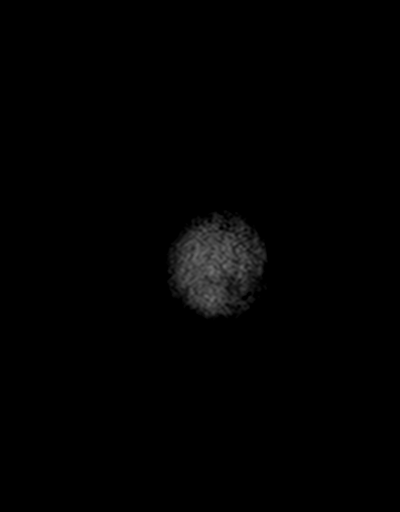

[Series 13: post t1_mpr_tra · axial · 1.0mm · 0.72mm/px · z∈[-63,+80]mm · 9 of 144 slices shown]
[im 1/144]
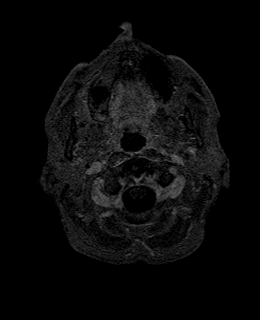
[im 18/144]
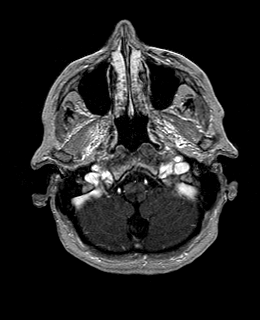
[im 36/144]
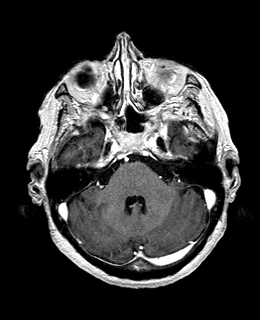
[im 54/144]
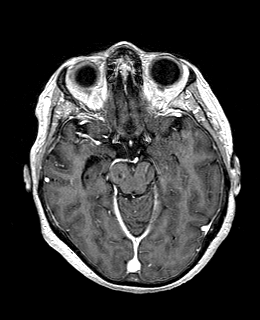
[im 72/144]
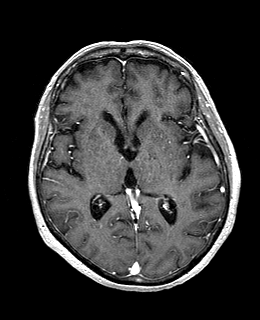
[im 90/144]
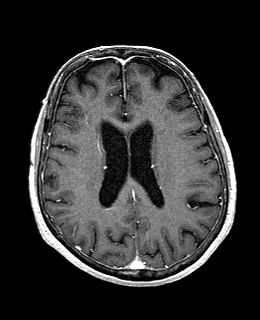
[im 108/144]
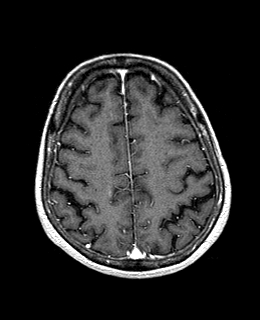
[im 126/144]
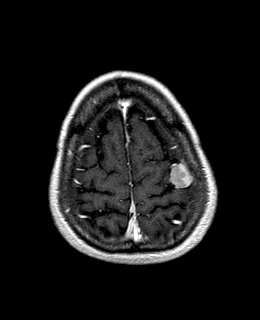
[im 144/144]
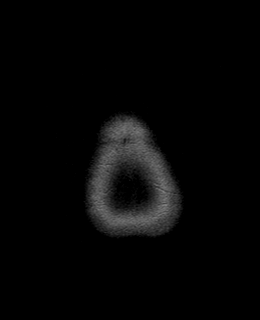

[Series 14: T1 post-contrast · coronal · 5.0mm · 0.72mm/px · 2 of 30 slices shown]
[im 1/30]
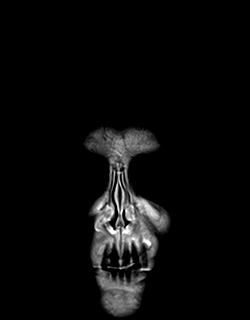
[im 30/30]
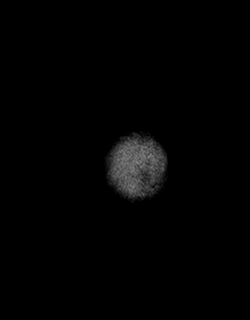

[Series 15: t1_se_sag post · sagittal · 5.0mm · 0.45mm/px · 1 of 21 slices shown]
[im 1/21]
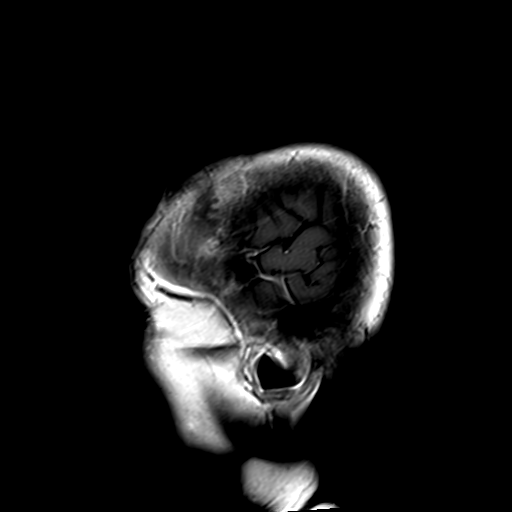

[48 of 48 positions shown; findings below may reference images not displayed]

FINDINGS: Brain: Left posterior frontal convexity meningioma measures 16 mm in
diameter. The lesion is calcified and shows dural based enhancement.
No edema in the adjacent brain. No other mass lesion.

Age appropriate atrophy. Negative for hydrocephalus. Negative for
acute infarct. No significant chronic ischemic change. Negative for
hemorrhage or fluid collection.

Vascular: Normal arterial flow voids

Skull and upper cervical spine: No focal skull lesion.

Extensive degenerative changes C1-2 with prominent pannus. Three 4
mm anterolisthesis C3-4

Sinuses/Orbits: Paranasal sinuses clear. Bilateral cataract surgery.

Other: None
IMPRESSION: 16 mm calcified meningioma left frontal convexity unchanged from
recent CT. No significant brain edema

Negative for acute or chronic ischemia.

## 2020-02-08 ENCOUNTER — Other Ambulatory Visit: Payer: Self-pay | Admitting: Neurosurgery

## 2020-02-08 DIAGNOSIS — D329 Benign neoplasm of meninges, unspecified: Secondary | ICD-10-CM

## 2020-02-22 ENCOUNTER — Other Ambulatory Visit: Payer: Self-pay

## 2020-02-22 ENCOUNTER — Ambulatory Visit (HOSPITAL_COMMUNITY)
Admission: RE | Admit: 2020-02-22 | Discharge: 2020-02-22 | Disposition: A | Discharge status: Home / Self Care | Payer: Medicare Other | Source: Ambulatory Visit | Attending: Internal Medicine | Admitting: Internal Medicine

## 2020-02-22 ENCOUNTER — Encounter (HOSPITAL_COMMUNITY): Payer: Self-pay | Admitting: Internal Medicine

## 2020-02-22 VITALS — BP 118/58 | HR 80 | Ht 59.5 in | Wt 115.4 lb

## 2020-02-22 DIAGNOSIS — I080 Rheumatic disorders of both mitral and aortic valves: Secondary | ICD-10-CM | POA: Insufficient documentation

## 2020-02-22 DIAGNOSIS — I358 Other nonrheumatic aortic valve disorders: Secondary | ICD-10-CM | POA: Diagnosis not present

## 2020-02-22 DIAGNOSIS — E079 Disorder of thyroid, unspecified: Secondary | ICD-10-CM | POA: Insufficient documentation

## 2020-02-22 DIAGNOSIS — I340 Nonrheumatic mitral (valve) insufficiency: Secondary | ICD-10-CM | POA: Diagnosis present

## 2020-02-22 DIAGNOSIS — Z86010 Personal history of colonic polyps: Secondary | ICD-10-CM | POA: Diagnosis not present

## 2020-02-22 DIAGNOSIS — Z79899 Other long term (current) drug therapy: Secondary | ICD-10-CM | POA: Insufficient documentation

## 2020-02-22 DIAGNOSIS — Z79890 Hormone replacement therapy (postmenopausal): Secondary | ICD-10-CM | POA: Insufficient documentation

## 2020-02-22 DIAGNOSIS — E785 Hyperlipidemia, unspecified: Secondary | ICD-10-CM | POA: Diagnosis not present

## 2020-02-22 DIAGNOSIS — K219 Gastro-esophageal reflux disease without esophagitis: Secondary | ICD-10-CM | POA: Insufficient documentation

## 2020-02-22 DIAGNOSIS — M1990 Unspecified osteoarthritis, unspecified site: Secondary | ICD-10-CM | POA: Insufficient documentation

## 2020-02-22 DIAGNOSIS — Z7951 Long term (current) use of inhaled steroids: Secondary | ICD-10-CM | POA: Diagnosis not present

## 2020-02-22 DIAGNOSIS — I10 Essential (primary) hypertension: Secondary | ICD-10-CM

## 2020-02-22 NOTE — Patient Instructions (Signed)
No medication changes!  Your physician recommends that you schedule a follow-up appointment in: 12 months with Dr Haroldine Laws. We will call you to schedule this appointment.   Please call office at [phone] option 2 if you have any questions or concerns.   At the [facility], you and your health needs are our priority. As part of our continuing mission to provide you with exceptional heart care, we have created designated Provider Care Teams. These Care Teams include your primary Cardiologist (physician) and Advanced Practice Providers (APPs- Physician Assistants and Nurse Practitioners) who all work together to provide you with the care you need, when you need it.   You may see any of the following providers on your designated Care Team at your next follow up: [name] Dr Glori Bickers . Dr Loralie Champagne . Darrick Grinder, NP . Lyda Jester, PA . Audry Riles, PharmD   Please be sure to bring in all your medications bottles to every appointment.

## 2020-02-22 NOTE — Progress Notes (Signed)
CARDIOLOGY CLINIC NOTE  Patient ID: [name], female   DOB: [birth year], 84 y.o.   MRN: [id number]  HPI:  [name] is a 84 year old woman with a history of hyperlipidemia, mild mitral valve prolapse with mild mitral regurgitation (echo 12/11).  She has undergone CT angiogram at the [facility] about 10 years ago which was totally normal.   She returns today for regular follow up. Moved to [place] last week so hasn't been able to exercise as much. No CP or SOB. Notes her veins are much more prominent. No edema.  Echo 8/20 EF 60-65 grade I DD Mild AI. No significant AS Personally reviewed  Echo 02/2017: EF 55%Trivial MVP Mild MR Calcified Aov Moves well. Mild AI. No AS Echo 02/2018: EF 60% Mild MR Calcified AoV Very mild AS mean gradient 4 Mild AI Grade I DD Personally reviewed   Lab Results  Component Value Date   CHOL 156 12/26/2019   HDL 40.70 12/26/2019   LDLCALC 90 12/26/2019   LDLDIRECT 109.0 06/19/2019   TRIG 124.0 12/26/2019   CHOLHDL 4 12/26/2019    ROS: All systems negative except as listed in HPI, PMH and Problem List.  Past Medical History:  Diagnosis Date  . Allergic rhinitis   . Atrophic vaginitis   . Baker's cyst    Left-Dr. Aluisio  . Cataract    Dr. Katy Fitch  . Colon polyps   . Fibroid   . Gastritis    chronic  . GERD (gastroesophageal reflux disease)   . Heart murmur   . Hemorrhoids   . Hyperlipidemia   . IBS (irritable bowel syndrome)   . MVP (mitral valve prolapse)    Antibiotics required for dental procedures  . OA (osteoarthritis)   . Osteoporosis 06/2018   T score -2.2 stable on Prolia  . Overactive bladder   . Thyroid disease   . Thyroid nodule   . Torn meniscus    bilateral    Current Outpatient Medications  Medication Sig Dispense Refill  . albuterol (PROVENTIL HFA;VENTOLIN HFA) 108 (90 BASE) MCG/ACT inhaler Inhale 2 puffs into the lungs every 6 (six) hours as needed for wheezing. 1 Inhaler 3  . azelastine (ASTELIN) 0.1 % nasal spray  Place into both nostrils at bedtime. Use in each nostril as directed    . budesonide-formoterol (SYMBICORT) 160-4.5 MCG/ACT inhaler Inhale 2 puffs 2 (two) times daily into the lungs. 1 Inhaler 3  . Calcium Carbonate-Vitamin D (CALCIUM-VITAMIN D) 500-200 MG-UNIT per tablet Take 3 tablets by mouth daily.     . Cholecalciferol 4000 units TABS Take 1 tablet by mouth daily.    [name] desonide (DESOWEN) 0.05 % cream Apply topically 2 (two) times daily. 30 g 0  . diclofenac Sodium (VOLTAREN) 1 % GEL APPLY 4 GRAMS 4 TIMES DAILY. 100 g 3  . EPINEPHrine (EPIPEN 2-PAK) 0.3 mg/0.3 mL IJ SOAJ injection Inject 0.3 mLs (0.3 mg total) into the muscle as needed. 1 Device 1  . famotidine (PEPCID) 40 MG tablet Take 1 tablet (40 mg total) by mouth at bedtime. 30 tablet 3  . ipratropium (ATROVENT) 0.06 % nasal spray Place 1 spray into the nose 3 (three) times daily as needed for rhinitis. 15 mL 0  . levocetirizine (XYZAL) 5 MG tablet Take 5 mg by mouth every evening.    [name] levothyroxine (SYNTHROID) 50 MCG tablet TAKE 1 TABLET ONCE DAILY BEFORE BREAKFAST. 30 tablet 11  . LORazepam (ATIVAN) 1 MG tablet Take 2-3 mg at hs and 1-2 mg  in the daytime prn 150 tablet 2  . mometasone (NASONEX) 50 MCG/ACT nasal spray Place 2 sprays into the nose every morning.     . montelukast (SINGULAIR) 10 MG tablet Take 1 tablet (10 mg total) by mouth at bedtime. 90 tablet 1  . NONFORMULARY OR COMPOUNDED ITEM Estradiol 0.02% vaginal cream S:Insert 94ml vaginally twice a week. 90 each 0  . nystatin-triamcinolone ointment (MYCOLOG) Apply 1 application topically 2 (two) times daily. 30 g 1  . pantoprazole (PROTONIX) 40 MG tablet Take 1 tablet (40 mg total) by mouth 2 (two) times daily before a meal. 60 tablet 3  . SALINE NASAL SPRAY NA Place into the nose.    . simvastatin (ZOCOR) 40 MG tablet TAKE 1 TABLET ONCE DAILY. 90 tablet 0  . triamcinolone cream (KENALOG) 0.1 % Apply 1 application topically 3 (three) times daily. 80 g 3  . VESICARE 10 MG  tablet Take 1 tablet by mouth as needed.      No current facility-administered medications for this encounter.    Filed Weights   02/22/20 0942  Weight: 52.3 kg (115 lb 6.4 oz)    PHYSICAL EXAM: Vitals:   02/22/20 0942  BP: (!) 118/58  Pulse: 80   General:  Well appearing. No resp difficulty HEENT: normal Neck: supple. no JVD. Carotids 2+ bilat; no bruits. No lymphadenopathy or thryomegaly appreciated. Cor: PMI nondisplaced. Regular rate & rhythm. No rubs, gallops or murmurs. Lungs: clear Abdomen: soft, nontender, nondistended. No hepatosplenomegaly. No bruits or masses. Good bowel sounds. Extremities: no cyanosis, clubbing, rash, edema. Arthritic changes Neuro: alert & orientedx3, cranial nerves grossly intact. moves all 4 extremities w/o difficulty. Affect pleasant   ASSESSMENT & PLAN:  1. Aortic valve thickening with very mild AS - Mild AI on echo 02/2017 - Echo 8/20 No significant AS  2. MVP, mild - Mild MR on echo 02/2017  3. Hyperlipidemia - Followed by PCP - Continue simvastatin 40 mg.    4. HTN - Blood pressure well controlled. Continue current regimen.   Glori Bickers, MD 10:19 AM

## 2020-03-06 ENCOUNTER — Other Ambulatory Visit: Payer: Self-pay

## 2020-03-06 ENCOUNTER — Ambulatory Visit
Admission: RE | Admit: 2020-03-06 | Discharge: 2020-03-06 | Disposition: A | Discharge status: Home / Self Care | Payer: Medicare Other | Source: Ambulatory Visit | Attending: Neurosurgery | Admitting: Neurosurgery

## 2020-03-06 DIAGNOSIS — D329 Benign neoplasm of meninges, unspecified: Secondary | ICD-10-CM | POA: Diagnosis not present

## 2020-03-06 MED ORDER — GADOBENATE DIMEGLUMINE 529 MG/ML IV SOLN
10.0000 mL | Freq: Once | INTRAVENOUS | Status: AC | PRN
  Administered 2020-03-06: 10 mL via INTRAVENOUS

## 2020-03-11 ENCOUNTER — Ambulatory Visit (INDEPENDENT_AMBULATORY_CARE_PROVIDER_SITE_OTHER): Payer: Medicare Other | Admitting: Psychology

## 2020-03-11 DIAGNOSIS — F4320 Adjustment disorder, unspecified: Secondary | ICD-10-CM

## 2020-03-13 ENCOUNTER — Telehealth: Payer: Self-pay | Admitting: *Deleted

## 2020-03-13 ENCOUNTER — Other Ambulatory Visit: Payer: Self-pay | Admitting: Internal Medicine

## 2020-03-13 NOTE — Telephone Encounter (Addendum)
Deductible $203 ($203MET)  OOP MAX N/A  Annual exam 06/26/2019 TF  Calcium  9.2           Date 12/26/2019  Upcoming dental procedures   Prior Authorization needed NO  Pt estimated Cost $0   appt 04/24/2020   Coverage Details: $0 ONE DOSE,$0 ADMIN FEE

## 2020-03-14 ENCOUNTER — Other Ambulatory Visit: Payer: Self-pay

## 2020-03-14 ENCOUNTER — Other Ambulatory Visit (INDEPENDENT_AMBULATORY_CARE_PROVIDER_SITE_OTHER): Payer: Medicare Other

## 2020-03-14 DIAGNOSIS — I10 Essential (primary) hypertension: Secondary | ICD-10-CM | POA: Diagnosis not present

## 2020-03-14 DIAGNOSIS — R739 Hyperglycemia, unspecified: Secondary | ICD-10-CM | POA: Diagnosis not present

## 2020-03-14 DIAGNOSIS — D473 Essential (hemorrhagic) thrombocythemia: Secondary | ICD-10-CM | POA: Diagnosis not present

## 2020-03-14 DIAGNOSIS — D75839 Thrombocytosis, unspecified: Secondary | ICD-10-CM

## 2020-03-14 LAB — HEMOGLOBIN A1C: Hgb A1c MFr Bld: 5.6 % (ref 4.6–6.5)

## 2020-03-14 LAB — CBC WITH DIFFERENTIAL/PLATELET
Basophils Absolute: 0.1 10*3/uL (ref 0.0–0.1)
Basophils Relative: 1 % (ref 0.0–3.0)
Eosinophils Absolute: 0.2 10*3/uL (ref 0.0–0.7)
Eosinophils Relative: 3.2 % (ref 0.0–5.0)
HCT: 40.2 % (ref 36.0–46.0)
Hemoglobin: 13.6 g/dL (ref 12.0–15.0)
Lymphocytes Relative: 23.2 % (ref 12.0–46.0)
Lymphs Abs: 1.3 10*3/uL (ref 0.7–4.0)
MCHC: 33.9 g/dL (ref 30.0–36.0)
MCV: 94.3 fl (ref 78.0–100.0)
Monocytes Absolute: 0.6 10*3/uL (ref 0.1–1.0)
Monocytes Relative: 10.6 % (ref 3.0–12.0)
Neutro Abs: 3.5 10*3/uL (ref 1.4–7.7)
Neutrophils Relative %: 62 % (ref 43.0–77.0)
Platelets: 276 10*3/uL (ref 150.0–400.0)
RBC: 4.26 Mil/uL (ref 3.87–5.11)
RDW: 15.3 % (ref 11.5–15.5)
WBC: 5.6 10*3/uL (ref 4.0–10.5)

## 2020-03-14 LAB — BASIC METABOLIC PANEL
BUN: 14 mg/dL (ref 6–23)
CO2: 29 mEq/L (ref 19–32)
Calcium: 9.6 mg/dL (ref 8.4–10.5)
Chloride: 99 mEq/L (ref 96–112)
Creatinine, Ser: 0.71 mg/dL (ref 0.40–1.20)
GFR: 78.87 mL/min (ref >60.00)
Glucose, Bld: 124 mg/dL — ABNORMAL HIGH (ref 70–99)
Potassium: 4.9 mEq/L (ref 3.5–5.1)
Sodium: 135 mEq/L (ref 135–145)

## 2020-03-18 ENCOUNTER — Other Ambulatory Visit: Payer: Self-pay

## 2020-03-18 ENCOUNTER — Encounter: Payer: Self-pay | Admitting: Internal Medicine

## 2020-03-18 ENCOUNTER — Ambulatory Visit (INDEPENDENT_AMBULATORY_CARE_PROVIDER_SITE_OTHER): Payer: Medicare Other | Admitting: Internal Medicine

## 2020-03-18 VITALS — BP 130/78 | HR 78 | Temp 98.0°F | Ht 59.5 in | Wt 118.0 lb

## 2020-03-18 DIAGNOSIS — M2550 Pain in unspecified joint: Secondary | ICD-10-CM | POA: Insufficient documentation

## 2020-03-18 DIAGNOSIS — E034 Atrophy of thyroid (acquired): Secondary | ICD-10-CM

## 2020-03-18 DIAGNOSIS — G8929 Other chronic pain: Secondary | ICD-10-CM

## 2020-03-18 DIAGNOSIS — I10 Essential (primary) hypertension: Secondary | ICD-10-CM | POA: Diagnosis not present

## 2020-03-18 DIAGNOSIS — M545 Low back pain: Secondary | ICD-10-CM

## 2020-03-18 NOTE — Assessment & Plan Note (Signed)
NAS diet 

## 2020-03-18 NOTE — Assessment & Plan Note (Signed)
Worse Rheum ref

## 2020-03-18 NOTE — Progress Notes (Signed)
Subjective:  Patient ID: [name], female    DOB: March 21, 1936  Age: 84 y.o. MRN: [id number]  CC: No chief complaint on file.   HPI [name] presents for [name], neck pain and joints pains - worse: taking Tramadol tid, tylenol tid to control it. F/u hypothyroidism, GERD, anxiety  Outpatient Medications Prior to Visit  Medication Sig Dispense Refill  . Acetaminophen (TYLENOL PO) Take by mouth.    [name] albuterol (PROVENTIL HFA;VENTOLIN HFA) 108 (90 BASE) MCG/ACT inhaler Inhale 2 puffs into the lungs every 6 (six) hours as needed for wheezing. 1 Inhaler 3  . azelastine (ASTELIN) 0.1 % nasal spray Place into both nostrils at bedtime. Use in each nostril as directed    . Calcium Carbonate-Vitamin D (CALCIUM-VITAMIN D) 500-200 MG-UNIT per tablet Take 3 tablets by mouth daily.     . Cholecalciferol 4000 units TABS Take 1 tablet by mouth daily.    [name] desonide (DESOWEN) 0.05 % cream Apply topically 2 (two) times daily. (Patient taking differently: Apply topically 2 (two) times daily as needed. ) 30 g 0  . diclofenac Sodium (VOLTAREN) 1 % GEL APPLY 4 GRAMS 4 TIMES DAILY. 100 g 0  . EPINEPHrine (EPIPEN 2-PAK) 0.3 mg/0.3 mL IJ SOAJ injection Inject 0.3 mLs (0.3 mg total) into the muscle as needed. 1 Device 1  . famotidine (PEPCID) 40 MG tablet Take 1 tablet (40 mg total) by mouth at bedtime. 30 tablet 3  . ipratropium (ATROVENT) 0.06 % nasal spray Place 1 spray into the nose 3 (three) times daily as needed for rhinitis. 15 mL 0  . levocetirizine (XYZAL) 5 MG tablet Take 5 mg by mouth every evening.    [name] levothyroxine (SYNTHROID) 50 MCG tablet TAKE 1 TABLET ONCE DAILY BEFORE BREAKFAST. 30 tablet 11  . LORazepam (ATIVAN) 1 MG tablet Take 2-3 mg at hs and 1-2 mg in the daytime prn 150 tablet 2  . mometasone (NASONEX) 50 MCG/ACT nasal spray Place 2 sprays into the nose every morning.     . montelukast (SINGULAIR) 10 MG tablet Take 1 tablet (10 mg total) by mouth at bedtime. (Patient taking differently: Take  10 mg by mouth every other day. ) 90 tablet 1  . NONFORMULARY OR COMPOUNDED ITEM Estradiol 0.02% vaginal cream S:Insert 70ml vaginally twice a week. 90 each 0  . nystatin-triamcinolone ointment (MYCOLOG) Apply 1 application topically 2 (two) times daily. (Patient taking differently: Apply 1 application topically 2 (two) times daily as needed. ) 30 g 1  . pantoprazole (PROTONIX) 40 MG tablet Take 1 tablet (40 mg total) by mouth 2 (two) times daily before a meal. (Patient taking differently: Take 40 mg by mouth daily. ) 60 tablet 3  . SALINE NASAL SPRAY NA Place into the nose as needed.     . simvastatin (ZOCOR) 40 MG tablet TAKE 1 TABLET ONCE DAILY. 90 tablet 0  . triamcinolone cream (KENALOG) 0.1 % Apply 1 application topically 3 (three) times daily. (Patient taking differently: Apply 1 application topically 3 (three) times daily as needed. ) 80 g 3  . VESICARE 10 MG tablet Take 1 tablet by mouth as needed.     . budesonide-formoterol (SYMBICORT) 160-4.5 MCG/ACT inhaler Inhale 2 puffs 2 (two) times daily into the lungs. (Patient taking differently: Inhale 2 puffs into the lungs 2 (two) times daily as needed. ) 1 Inhaler 3   No facility-administered medications prior to visit.    ROS: Review of Systems  Constitutional: Negative for activity change, appetite  change, chills, fatigue and unexpected weight change.  HENT: Negative for congestion, mouth sores and sinus pressure.   Eyes: Negative for visual disturbance.  Respiratory: Negative for cough and chest tightness.   Gastrointestinal: Negative for abdominal pain and nausea.  Genitourinary: Negative for difficulty urinating, frequency and vaginal pain.  Musculoskeletal: Positive for arthralgias, back pain, neck pain and neck stiffness. Negative for gait problem.  Skin: Negative for pallor and rash.  Neurological: Negative for dizziness, tremors, weakness, numbness and headaches.  Psychiatric/Behavioral: Negative for confusion, sleep  disturbance and suicidal ideas.    Objective:  BP 130/78 (BP Location: Left Arm, Patient Position: Sitting, Cuff Size: Normal)   Pulse 78   Temp 98 F (36.7 C) (Oral)   Ht 4' 11.5" (1.511 m)   Wt 118 lb (53.5 kg)   SpO2 98%   BMI 23.43 kg/m   BP Readings from Last 3 Encounters:  03/18/20 130/78  02/22/20 (!) 118/58  01/02/20 104/64    Wt Readings from Last 3 Encounters:  03/18/20 118 lb (53.5 kg)  02/22/20 115 lb 6.4 oz (52.3 kg)  11/06/18 125 lb (56.7 kg)    Physical Exam Constitutional:      General: She is not in acute distress.    Appearance: She is well-developed.  HENT:     Head: Normocephalic.     Right Ear: External ear normal.     Left Ear: External ear normal.     Nose: Nose normal.  Eyes:     General:        Right eye: No discharge.        Left eye: No discharge.     Conjunctiva/sclera: Conjunctivae normal.     Pupils: Pupils are equal, round, and reactive to light.  Neck:     Thyroid: No thyromegaly.     Vascular: No JVD.     Trachea: No tracheal deviation.  Cardiovascular:     Rate and Rhythm: Normal rate and regular rhythm.     Heart sounds: Normal heart sounds.  Pulmonary:     Effort: No respiratory distress.     Breath sounds: No stridor. No wheezing.  Abdominal:     General: Bowel sounds are normal. There is no distension.     Palpations: Abdomen is soft. There is no mass.     Tenderness: There is no abdominal tenderness. There is no guarding or rebound.  Musculoskeletal:        General: No tenderness.     Cervical back: Normal range of motion and neck supple.  Lymphadenopathy:     Cervical: No cervical adenopathy.  Skin:    Findings: No erythema or rash.  Neurological:     Cranial Nerves: No cranial nerve deficit.     Motor: No abnormal muscle tone.     Coordination: Coordination normal.     Deep Tendon Reflexes: Reflexes normal.  Psychiatric:        Behavior: Behavior normal.        Thought Content: Thought content normal.         Judgment: Judgment normal.     Lab Results  Component Value Date   WBC 5.6 03/14/2020   HGB 13.6 03/14/2020   HCT 40.2 03/14/2020   PLT 276.0 03/14/2020   GLUCOSE 124 (H) 03/14/2020   CHOL 156 12/26/2019   TRIG 124.0 12/26/2019   HDL 40.70 12/26/2019   LDLDIRECT 109.0 06/19/2019   LDLCALC 90 12/26/2019   ALT 16 12/26/2019   AST 17 12/26/2019  NA 135 03/14/2020   K 4.9 03/14/2020   CL 99 03/14/2020   CREATININE 0.71 03/14/2020   BUN 14 03/14/2020   CO2 29 03/14/2020   TSH 2.14 12/26/2019   HGBA1C 5.6 03/14/2020    MR BRAIN W WO CONTRAST  Result Date: 03/07/2020 CLINICAL DATA:  Meningioma. EXAM: MRI HEAD WITHOUT AND WITH CONTRAST TECHNIQUE: Multiplanar, multiecho pulse sequences of the brain and surrounding structures were obtained without and with intravenous contrast. CONTRAST:  51mL MULTIHANCE GADOBENATE DIMEGLUMINE 529 MG/ML IV SOLN COMPARISON:  MR head without and with contrast 07/11/2019 FINDINGS: Brain: Posterior left frontal meningioma is stable, measuring 16 x 16 x 9 mm. No significant associated vasogenic edema is present. No other dural-based mass lesions are present. Acute infarct, hemorrhage or parenchymal mass is present. The ventricles are of normal size. No significant extraaxial fluid collection is present. The internal auditory canals are within normal limits. The brainstem and cerebellum are within normal limits. Postcontrast images demonstrate no other pathologic enhancement. Vascular: Flow is present in the major intracranial arteries. Skull and upper cervical spine: The craniocervical junction is normal. Upper cervical spine is within normal limits. Marrow signal is unremarkable. Sinuses/Orbits: The paranasal sinuses and mastoid air cells are clear. Bilateral lens replacements are noted. Globes and orbits are otherwise unremarkable. IMPRESSION: 1. Stable left frontal meningioma. 2. Otherwise normal MRI of the brain. Electronically Signed   By: San Morelle  M.D.   On: 03/07/2020 06:04    Assessment & Plan:    Walker Kehr, MD

## 2020-03-18 NOTE — Assessment & Plan Note (Signed)
On Levothroid 

## 2020-03-18 NOTE — Assessment & Plan Note (Signed)
Labs Rheum ref

## 2020-03-20 ENCOUNTER — Ambulatory Visit: Payer: Medicare Other

## 2020-03-25 DIAGNOSIS — D329 Benign neoplasm of meninges, unspecified: Secondary | ICD-10-CM | POA: Diagnosis not present

## 2020-03-25 HISTORY — DX: Benign neoplasm of meninges, unspecified: D32.9

## 2020-04-01 ENCOUNTER — Other Ambulatory Visit: Payer: Self-pay | Admitting: Internal Medicine

## 2020-04-20 ENCOUNTER — Other Ambulatory Visit: Payer: Self-pay | Admitting: Gastroenterology

## 2020-04-24 ENCOUNTER — Ambulatory Visit (INDEPENDENT_AMBULATORY_CARE_PROVIDER_SITE_OTHER): Payer: Medicare Other | Admitting: *Deleted

## 2020-04-24 ENCOUNTER — Other Ambulatory Visit: Payer: Self-pay

## 2020-04-24 DIAGNOSIS — M810 Age-related osteoporosis without current pathological fracture: Secondary | ICD-10-CM

## 2020-04-24 MED ORDER — DENOSUMAB 60 MG/ML ~~LOC~~ SOSY
60.0000 mg | PREFILLED_SYRINGE | Freq: Once | SUBCUTANEOUS | Status: AC
  Administered 2020-04-24: 60 mg via SUBCUTANEOUS

## 2020-05-05 ENCOUNTER — Other Ambulatory Visit: Payer: Self-pay | Admitting: Internal Medicine

## 2020-05-07 NOTE — Progress Notes (Signed)
Office Visit Note  Patient: [name]             Date of Birth: Oct 08, 1936           MRN: [id number]             PCP: Cassandria Anger, MD Referring: Cassandria Anger, MD Visit Date: 05/12/2020 Occupation: @GUAROCC @  Subjective:  Lower back pain.   History of Present Illness: [name] is a 84 y.o. female with history of osteoarthritis.  She has been seen in consultation per request of her PCP.  According to the patient she has had history of lower back pain for many years.  Which has been gradually getting worse.  She brought MRI from 2019 with her lumbar spine.  She also has history of disc disease of cervical spine which causes discomfort off and on.  She states she has been experiencing discomfort in her feet and had x-rays of her right foot recently which showed osteoarthritis.  Her main concern today is her lower back pain.  She states she used to be very active prior to the pandemic and used to do Pilates classes on a regular basis and stretching.  She is to go to the chiropractor.  She recently moved to [facility] and has not been as active.  She just recently started walking.  She states she has been applying Voltaren gel 3 times a day to her lower back.  She takes Tylenol twice a day, tramadol once a day.  She uses heating pad at nighttime.  There is no history of joint swelling.  She also has osteoporosis and she is on Prolia injections.  Activities of Daily Living:  Patient reports morning stiffness for  30  minutes.   Patient Reports nocturnal pain.  Difficulty dressing/grooming: Denies Difficulty climbing stairs: Denies Difficulty getting out of chair: Denies Difficulty using hands for taps, buttons, cutlery, and/or writing: Denies  Review of Systems  Constitutional: Positive for fatigue. Negative for night sweats, weight gain and weight loss.  HENT: Positive for mouth dryness. Negative for mouth sores, trouble swallowing, trouble swallowing and nose dryness.     Eyes: Negative for pain, redness, itching, visual disturbance and dryness.  Respiratory: Negative for cough, shortness of breath and difficulty breathing.   Cardiovascular: Negative for chest pain, palpitations, hypertension, irregular heartbeat and swelling in legs/feet.  Gastrointestinal: Negative for blood in stool, constipation and diarrhea.  Endocrine: Negative for increased urination.  Genitourinary: Negative for difficulty urinating, painful urination and vaginal dryness.  Musculoskeletal: Positive for arthralgias, joint pain and morning stiffness. Negative for joint swelling, myalgias, muscle weakness, muscle tenderness and myalgias.  Skin: Negative for color change, rash, hair loss, redness, skin tightness, ulcers and sensitivity to sunlight.  Allergic/Immunologic: Negative for susceptible to infections.  Neurological: Positive for memory loss. Negative for dizziness, numbness, headaches, night sweats and weakness.  Hematological: Positive for bruising/bleeding tendency. Negative for swollen glands.  Psychiatric/Behavioral: Negative for depressed mood, confusion and sleep disturbance. The patient is not nervous/anxious.     PMFS History:  Patient Active Problem List   Diagnosis Date Noted  . Arthralgia 03/18/2020  . RUQ pain 01/02/2020  . Cholelithiasis 09/09/2019  . Ear pain, bilateral 03/21/2019  . Neck pain 12/26/2018  . Acute pain of left shoulder 12/26/2018  . Anxiety 08/22/2018  . Contact dermatitis and eczema 06/14/2018  . Other constipation 11/11/2016  . Low back pain 11/10/2016  . Leg abrasion 08/12/2016  . Contusion of right knee 08/12/2016  .  Sinusitis, chronic 08/06/2016  . Rash and nonspecific skin eruption 04/20/2016  . Aortic valve sclerosis 03/12/2015  . Contusion of left chest wall 01/09/2015  . Fatigue 09/08/2014  . Postherpetic neuralgia 10/02/2013  . Impacted cerumen of right ear 07/16/2013  . Headache 07/10/2013  . Aortic stenosis 04/19/2013  .  Insomnia 01/24/2013  . Cough 08/07/2012  . URI (upper respiratory infection) 08/03/2012  . Pruritus of skin 03/11/2012  . MVP (mitral valve prolapse)   . Multiple thyroid nodules 10/04/2011  . Herpes zoster 09/07/2011  . Essential hypertension, benign 07/27/2011  . Atrophic vaginitis   . Well adult exam 03/15/2011  . Hyperlipemia, mixed 03/15/2011  . OVERACTIVE BLADDER 09/02/2010  . Hypothyroidism 03/03/2010  . CALF PAIN, LEFT 10/27/2009  . Belching 07/21/2009  . EAR PAIN 12/19/2008  . Allergic rhinitis 12/19/2008  . Irritable bowel syndrome 09/04/2008  . PERSONAL HX COLONIC POLYPS 09/04/2008  . GASTRITIS, CHRONIC 09/03/2008  . DUODENITIS, WITHOUT HEMORRHAGE 09/03/2008  . TIBIALIS TENDINITIS 03/20/2008  . OSTEOARTHRITIS 02/25/2008  . SWEATING 02/21/2008  . LACTOSE INTOLERANCE 11/20/2007  . Dyslipidemia 11/20/2007  . GERD 11/20/2007  . OSTEOPOROSIS 11/20/2007    Past Medical History:  Diagnosis Date  . Allergic rhinitis   . Atrophic vaginitis   . Baker's cyst    Left-Dr. Aluisio  . Cataract    Dr. Katy Fitch  . Colon polyps   . Fibroid   . Gastritis    chronic  . GERD (gastroesophageal reflux disease)   . Heart murmur   . Hemorrhoids   . Hyperlipidemia   . IBS (irritable bowel syndrome)   . MVP (mitral valve prolapse)    Antibiotics required for dental procedures  . OA (osteoarthritis)   . Osteoporosis 06/2018   T score -2.2 stable on Prolia  . Overactive bladder   . Thyroid disease   . Thyroid nodule   . Torn meniscus    bilateral    Family History  Problem Relation Age of Onset  . Heart disease Mother   . Hypertension Mother   . Osteoporosis Mother   . Congestive Heart Failure Mother   . Pancreatic cancer Father   . Heart attack Brother   . Drug abuse Brother        over dose   . Healthy Son   . Healthy Son   . Colon cancer Neg Hx   . Colon polyps Neg Hx   . Rectal cancer Neg Hx   . Stomach cancer Neg Hx    Past Surgical History:  Procedure  Laterality Date  . CATARACT EXTRACTION, BILATERAL    . TONSILLECTOMY     Social History   Social History Narrative   Regular exercise-yes   Daily caffeine use   Immunization History  Administered Date(s) Administered  . Fluad Quad(high Dose 65+) 08/04/2019  . Influenza Whole 09/29/2007, 08/21/2008, 09/02/2010, 07/30/2012  . Influenza, High Dose Seasonal PF 09/03/2013, 08/06/2016, 08/20/2017, 09/20/2018  . Influenza,inj,Quad PF,6+ Mos 12/17/2014, 07/30/2015  . PFIZER SARS-COV-2 Vaccination 12/12/2019, 12/31/2019  . Pneumococcal Conjugate-13 01/09/2014  . Pneumococcal Polysaccharide-23 09/26/2006, 08/12/2015  . Td 04/14/2010  . Typhoid Inactivated 02/23/2013  . Zoster 12/09/2006     Objective: Vital Signs: BP 117/70 (BP Location: Right Arm, Patient Position: Sitting, Cuff Size: Normal)   Pulse 85   Resp 14   Ht 5' (1.524 m)   Wt 118 lb (53.5 kg)   BMI 23.05 kg/m    Physical Exam Vitals and nursing note reviewed.  Constitutional:  Appearance: She is well-developed.  HENT:     Head: Normocephalic and atraumatic.  Eyes:     Conjunctiva/sclera: Conjunctivae normal.  Cardiovascular:     Rate and Rhythm: Normal rate and regular rhythm.     Heart sounds: Normal heart sounds.  Pulmonary:     Effort: Pulmonary effort is normal.     Breath sounds: Normal breath sounds.  Abdominal:     General: Bowel sounds are normal.     Palpations: Abdomen is soft.  Musculoskeletal:     Cervical back: Normal range of motion.  Lymphadenopathy:     Cervical: No cervical adenopathy.  Skin:    General: Skin is warm and dry.     Capillary Refill: Capillary refill takes less than 2 seconds.  Neurological:     Mental Status: She is alert and oriented to person, place, and time.  Psychiatric:        Behavior: Behavior normal.      Musculoskeletal Exam: Patient is limited range of motion of her cervical spine.  She has limited range of motion of lumbar spine with discomfort in the  lower lumbar region.  She also had discomfort in the bilateral SI joint area.  Shoulder joints, elbow joints, wrist joints with good range of motion.  She has bilateral PIP and DIP thickening in her hands consistent with osteoarthritis.  Hip joints with good range of motion.  Knee joints with good range of motion.  She has bilateral pes planus and bilateral hammertoes.  CDAI Exam: CDAI Score: -- Patient Global: --; Provider Global: -- Swollen: --; Tender: -- Joint Exam 05/12/2020   No joint exam has been documented for this visit   There is currently no information documented on the homunculus. Go to the Rheumatology activity and complete the homunculus joint exam.  Investigation: No additional findings.  Imaging: XR Lumbar Spine 2-3 Views  Result Date: 05/12/2020 Levoscoliosis was noted.  Multilevel spondylosis from L1-S1 with anterior osteophytes was noted.  Severe narrowing was noted between L1 and L2.  Spondylolisthesis was noted at L5-S1.  Multilevel foraminal narrowing was noted.  Facet joint arthropathy was noted. Impression: Multilevel spondylosis of entire lumbar spine with L5-S1 significant spondylolisthesis was noted.  XR Pelvis 1-2 Views  Result Date: 05/12/2020 No SI joint sclerosis or narrowing was noted. Impression: Unremarkable x-ray of the SI joints.   Recent Labs: Lab Results  Component Value Date   WBC 5.6 03/14/2020   HGB 13.6 03/14/2020   PLT 276.0 03/14/2020   NA 135 03/14/2020   K 4.9 03/14/2020   CL 99 03/14/2020   CO2 29 03/14/2020   GLUCOSE 124 (H) 03/14/2020   BUN 14 03/14/2020   CREATININE 0.71 03/14/2020   BILITOT 0.3 12/26/2019   ALKPHOS 63 12/26/2019   AST 17 12/26/2019   ALT 16 12/26/2019   PROT 7.5 12/26/2019   ALBUMIN 3.9 12/26/2019   CALCIUM 9.6 03/14/2020   GFRAA 91 08/22/2008  X-ray of the lumbar spine from May 24, 2018 showed multilevel degenerative disc disease from L1-S1.  She had grade 1 anterolisthesis L4-5 and L5-S1.  She also  had facet joint arthropathy.  Speciality Comments: No specialty comments available.  Procedures:  No procedures performed Allergies: Bee venom, Doxycycline, and Neosporin [neomycin-bacitracin zn-polymyx]   Assessment / Plan:     Visit Diagnoses:   DDD (degenerative disc disease), cervical-patient has chronic discomfort in her cervical spine.  I reviewed x-rays of her cervical spine from December 26, 2018 which showed multilevel severe  degenerative disc disease and foraminal narrowing bilaterally.  She denies any radiculopathy.  DDD (degenerative disc disease), lumbar -she states her lower back pain has been gradually getting worse.  She is to go to Pilates and also to her chiropractor prior to the pandemic.  Now she has moved to [facility].  She states she has been walking.  I reviewed her x-rays from June 2019 which shows multilevel degenerative disc disease from L1-S1.  Anterolisthesis was also noted.  She has been experiencing some pain in the gluteal region.  This could be radiculopathy.  Plan: XR Lumbar Spine 2-3 Views.  Levoscoliosis with multilevel spondylosis and spondylolisthesis was noted.  X-ray findings were reviewed and discussed with the patient.  Have given her a prescription for physical therapy which she will do locally at [facility].  If she has no improvement from physical therapy we may consider MRI to evaluate this further.  Patient is complaining about bilateral radiculopathy.  Water exercises and Pilates were encouraged.  Chronic SI joint pain -she has been experiencing pain in bilateral SI joints.  Plan: XR Pelvis 1-2 Views.  X-ray of the SI joints are unremarkable.  Primary osteoarthritis of both feet-she has severe osteoarthritis in her feet with hammertoes.  Age-related osteoporosis without current pathological fracture-according to patient she has been on Prolia.  She is also taking calcium and vitamin D.  Essential hypertension, benign-blood pressure is  controlled.  Other medical problems are listed as follows:  History of hyperlipidemia  Aortic valve sclerosis  MVP (mitral valve prolapse)  History of gastroesophageal reflux (GERD)  History of IBS  Hx of colonic polyps  Calculus of gallbladder without cholecystitis without obstruction  Multiple thyroid nodules  Hypothyroidism due to acquired atrophy of thyroid    Orders: Orders Placed This Encounter  Procedures  . XR Lumbar Spine 2-3 Views  . XR Pelvis 1-2 Views   No orders of the defined types were placed in this encounter.     Follow-Up Instructions: Return in about 2 months (around 07/12/2020) for Lower back pain.   Bo Merino, MD  Note - This record has been created using Editor, commissioning.  Chart creation errors have been sought, but may not always  have been located. Such creation errors do not reflect on  the standard of medical care.

## 2020-05-12 ENCOUNTER — Ambulatory Visit: Payer: Self-pay

## 2020-05-12 ENCOUNTER — Ambulatory Visit (INDEPENDENT_AMBULATORY_CARE_PROVIDER_SITE_OTHER): Payer: Medicare Other | Admitting: Rheumatology

## 2020-05-12 ENCOUNTER — Other Ambulatory Visit: Payer: Self-pay

## 2020-05-12 ENCOUNTER — Encounter: Payer: Self-pay | Admitting: Rheumatology

## 2020-05-12 VITALS — BP 117/70 | HR 85 | Resp 14 | Ht 60.0 in | Wt 118.0 lb

## 2020-05-12 DIAGNOSIS — Z8719 Personal history of other diseases of the digestive system: Secondary | ICD-10-CM

## 2020-05-12 DIAGNOSIS — E034 Atrophy of thyroid (acquired): Secondary | ICD-10-CM

## 2020-05-12 DIAGNOSIS — M19041 Primary osteoarthritis, right hand: Secondary | ICD-10-CM | POA: Diagnosis not present

## 2020-05-12 DIAGNOSIS — M533 Sacrococcygeal disorders, not elsewhere classified: Secondary | ICD-10-CM

## 2020-05-12 DIAGNOSIS — Z860100 Personal history of colon polyps, unspecified: Secondary | ICD-10-CM

## 2020-05-12 DIAGNOSIS — Z8639 Personal history of other endocrine, nutritional and metabolic disease: Secondary | ICD-10-CM | POA: Diagnosis not present

## 2020-05-12 DIAGNOSIS — I10 Essential (primary) hypertension: Secondary | ICD-10-CM | POA: Diagnosis not present

## 2020-05-12 DIAGNOSIS — M810 Age-related osteoporosis without current pathological fracture: Secondary | ICD-10-CM | POA: Diagnosis not present

## 2020-05-12 DIAGNOSIS — M19072 Primary osteoarthritis, left ankle and foot: Secondary | ICD-10-CM

## 2020-05-12 DIAGNOSIS — M5030 Other cervical disc degeneration, unspecified cervical region: Secondary | ICD-10-CM | POA: Diagnosis not present

## 2020-05-12 DIAGNOSIS — I358 Other nonrheumatic aortic valve disorders: Secondary | ICD-10-CM

## 2020-05-12 DIAGNOSIS — M5136 Other intervertebral disc degeneration, lumbar region: Secondary | ICD-10-CM | POA: Diagnosis not present

## 2020-05-12 DIAGNOSIS — M51369 Other intervertebral disc degeneration, lumbar region without mention of lumbar back pain or lower extremity pain: Secondary | ICD-10-CM

## 2020-05-12 DIAGNOSIS — E042 Nontoxic multinodular goiter: Secondary | ICD-10-CM

## 2020-05-12 DIAGNOSIS — G8929 Other chronic pain: Secondary | ICD-10-CM | POA: Diagnosis not present

## 2020-05-12 DIAGNOSIS — I341 Nonrheumatic mitral (valve) prolapse: Secondary | ICD-10-CM | POA: Diagnosis not present

## 2020-05-12 DIAGNOSIS — M19071 Primary osteoarthritis, right ankle and foot: Secondary | ICD-10-CM

## 2020-05-12 DIAGNOSIS — M19042 Primary osteoarthritis, left hand: Secondary | ICD-10-CM

## 2020-05-12 DIAGNOSIS — K8020 Calculus of gallbladder without cholecystitis without obstruction: Secondary | ICD-10-CM

## 2020-05-13 ENCOUNTER — Telehealth: Payer: Self-pay | Admitting: *Deleted

## 2020-05-13 NOTE — Telephone Encounter (Signed)
Received a fax from [facility] patient's place of resident's requesting her most recent office note. Last office note faxed as requested.

## 2020-05-21 ENCOUNTER — Ambulatory Visit (INDEPENDENT_AMBULATORY_CARE_PROVIDER_SITE_OTHER): Payer: Medicare Other | Admitting: Psychology

## 2020-05-21 DIAGNOSIS — F4320 Adjustment disorder, unspecified: Secondary | ICD-10-CM

## 2020-05-29 DIAGNOSIS — M2569 Stiffness of other specified joint, not elsewhere classified: Secondary | ICD-10-CM | POA: Diagnosis not present

## 2020-05-29 DIAGNOSIS — R293 Abnormal posture: Secondary | ICD-10-CM | POA: Diagnosis not present

## 2020-05-29 DIAGNOSIS — M5136 Other intervertebral disc degeneration, lumbar region: Secondary | ICD-10-CM | POA: Diagnosis not present

## 2020-05-29 DIAGNOSIS — M4986 Spondylopathy in diseases classified elsewhere, lumbar region: Secondary | ICD-10-CM | POA: Diagnosis not present

## 2020-05-29 DIAGNOSIS — M545 Low back pain: Secondary | ICD-10-CM | POA: Diagnosis not present

## 2020-05-30 ENCOUNTER — Other Ambulatory Visit: Payer: Self-pay | Admitting: Internal Medicine

## 2020-05-31 ENCOUNTER — Other Ambulatory Visit: Payer: Self-pay | Admitting: Gastroenterology

## 2020-06-02 DIAGNOSIS — M4986 Spondylopathy in diseases classified elsewhere, lumbar region: Secondary | ICD-10-CM | POA: Diagnosis not present

## 2020-06-02 DIAGNOSIS — R293 Abnormal posture: Secondary | ICD-10-CM | POA: Diagnosis not present

## 2020-06-02 DIAGNOSIS — M2569 Stiffness of other specified joint, not elsewhere classified: Secondary | ICD-10-CM | POA: Diagnosis not present

## 2020-06-02 DIAGNOSIS — M545 Low back pain: Secondary | ICD-10-CM | POA: Diagnosis not present

## 2020-06-02 DIAGNOSIS — M5136 Other intervertebral disc degeneration, lumbar region: Secondary | ICD-10-CM | POA: Diagnosis not present

## 2020-06-05 DIAGNOSIS — R293 Abnormal posture: Secondary | ICD-10-CM | POA: Diagnosis not present

## 2020-06-05 DIAGNOSIS — M545 Low back pain: Secondary | ICD-10-CM | POA: Diagnosis not present

## 2020-06-05 DIAGNOSIS — M4986 Spondylopathy in diseases classified elsewhere, lumbar region: Secondary | ICD-10-CM | POA: Diagnosis not present

## 2020-06-05 DIAGNOSIS — M2569 Stiffness of other specified joint, not elsewhere classified: Secondary | ICD-10-CM | POA: Diagnosis not present

## 2020-06-05 DIAGNOSIS — M5136 Other intervertebral disc degeneration, lumbar region: Secondary | ICD-10-CM | POA: Diagnosis not present

## 2020-06-09 DIAGNOSIS — M2569 Stiffness of other specified joint, not elsewhere classified: Secondary | ICD-10-CM | POA: Diagnosis not present

## 2020-06-09 DIAGNOSIS — M4986 Spondylopathy in diseases classified elsewhere, lumbar region: Secondary | ICD-10-CM | POA: Diagnosis not present

## 2020-06-09 DIAGNOSIS — R293 Abnormal posture: Secondary | ICD-10-CM | POA: Diagnosis not present

## 2020-06-09 DIAGNOSIS — M5136 Other intervertebral disc degeneration, lumbar region: Secondary | ICD-10-CM | POA: Diagnosis not present

## 2020-06-09 DIAGNOSIS — M545 Low back pain: Secondary | ICD-10-CM | POA: Diagnosis not present

## 2020-06-12 ENCOUNTER — Telehealth: Payer: Self-pay

## 2020-06-12 DIAGNOSIS — M5136 Other intervertebral disc degeneration, lumbar region: Secondary | ICD-10-CM | POA: Diagnosis not present

## 2020-06-12 DIAGNOSIS — M2569 Stiffness of other specified joint, not elsewhere classified: Secondary | ICD-10-CM | POA: Diagnosis not present

## 2020-06-12 DIAGNOSIS — M545 Low back pain: Secondary | ICD-10-CM | POA: Diagnosis not present

## 2020-06-12 DIAGNOSIS — R739 Hyperglycemia, unspecified: Secondary | ICD-10-CM

## 2020-06-12 DIAGNOSIS — E034 Atrophy of thyroid (acquired): Secondary | ICD-10-CM

## 2020-06-12 DIAGNOSIS — I10 Essential (primary) hypertension: Secondary | ICD-10-CM

## 2020-06-12 DIAGNOSIS — R293 Abnormal posture: Secondary | ICD-10-CM | POA: Diagnosis not present

## 2020-06-12 DIAGNOSIS — M4986 Spondylopathy in diseases classified elsewhere, lumbar region: Secondary | ICD-10-CM | POA: Diagnosis not present

## 2020-06-12 NOTE — Telephone Encounter (Signed)
Labs orders

## 2020-06-13 ENCOUNTER — Other Ambulatory Visit: Payer: Medicare Other

## 2020-06-13 ENCOUNTER — Other Ambulatory Visit: Payer: Self-pay

## 2020-06-13 DIAGNOSIS — R739 Hyperglycemia, unspecified: Secondary | ICD-10-CM | POA: Diagnosis not present

## 2020-06-13 DIAGNOSIS — I10 Essential (primary) hypertension: Secondary | ICD-10-CM

## 2020-06-13 DIAGNOSIS — E034 Atrophy of thyroid (acquired): Secondary | ICD-10-CM | POA: Diagnosis not present

## 2020-06-14 LAB — BASIC METABOLIC PANEL
BUN: 16 mg/dL (ref 7–25)
CO2: 26 mmol/L (ref 20–32)
Calcium: 9.2 mg/dL (ref 8.6–10.4)
Chloride: 101 mmol/L (ref 98–110)
Creat: 0.69 mg/dL (ref 0.60–0.88)
Glucose, Bld: 92 mg/dL (ref 65–99)
Potassium: 5 mmol/L (ref 3.5–5.3)
Sodium: 137 mmol/L (ref 135–146)

## 2020-06-14 LAB — HEMOGLOBIN A1C
Hgb A1c MFr Bld: 5.5 % of total Hgb (ref <5.7)
Mean Plasma Glucose: 111 (calc)
eAG (mmol/L): 6.2 (calc)

## 2020-06-14 LAB — TSH: TSH: 1.98 mIU/L (ref 0.40–4.50)

## 2020-06-14 LAB — T4, FREE: Free T4: 1.5 ng/dL (ref 0.8–1.8)

## 2020-06-18 ENCOUNTER — Ambulatory Visit (INDEPENDENT_AMBULATORY_CARE_PROVIDER_SITE_OTHER): Payer: Medicare Other

## 2020-06-18 ENCOUNTER — Other Ambulatory Visit: Payer: Self-pay

## 2020-06-18 ENCOUNTER — Encounter: Payer: Self-pay | Admitting: Gynecology

## 2020-06-18 ENCOUNTER — Ambulatory Visit (INDEPENDENT_AMBULATORY_CARE_PROVIDER_SITE_OTHER): Payer: Medicare Other | Admitting: Internal Medicine

## 2020-06-18 ENCOUNTER — Encounter: Payer: Self-pay | Admitting: Internal Medicine

## 2020-06-18 VITALS — BP 118/70 | HR 98 | Temp 98.1°F | Resp 16 | Ht 60.0 in | Wt 117.6 lb

## 2020-06-18 DIAGNOSIS — M545 Low back pain: Secondary | ICD-10-CM | POA: Diagnosis not present

## 2020-06-18 DIAGNOSIS — M810 Age-related osteoporosis without current pathological fracture: Secondary | ICD-10-CM

## 2020-06-18 DIAGNOSIS — Z23 Encounter for immunization: Secondary | ICD-10-CM | POA: Diagnosis not present

## 2020-06-18 DIAGNOSIS — I10 Essential (primary) hypertension: Secondary | ICD-10-CM

## 2020-06-18 DIAGNOSIS — Z0000 Encounter for general adult medical examination without abnormal findings: Secondary | ICD-10-CM | POA: Diagnosis not present

## 2020-06-18 DIAGNOSIS — F419 Anxiety disorder, unspecified: Secondary | ICD-10-CM

## 2020-06-18 DIAGNOSIS — R739 Hyperglycemia, unspecified: Secondary | ICD-10-CM

## 2020-06-18 DIAGNOSIS — G8929 Other chronic pain: Secondary | ICD-10-CM | POA: Diagnosis not present

## 2020-06-18 DIAGNOSIS — E785 Hyperlipidemia, unspecified: Secondary | ICD-10-CM | POA: Diagnosis not present

## 2020-06-18 NOTE — Patient Instructions (Signed)
[employer] Mushroom supplement

## 2020-06-18 NOTE — Progress Notes (Addendum)
Subjective:   [name] is a 84 y.o. female who presents for Medicare Annual (Subsequent) preventive examination.  Review of Systems    NO ROS. Medicare Wellness Visit Cardiac Risk Factors include: advanced age (>2men, >74 women);dyslipidemia;family history of premature cardiovascular disease;hypertension     Objective:    Today's Vitals   06/18/20 0943  BP: 118/70  Pulse: 98  Resp: 16  Temp: 98.1 F (36.7 C)  SpO2: 97%  Weight: 117 lb 9.6 oz (53.3 kg)  Height: 5' (1.524 m)  PainSc: 0-No pain   Body mass index is 22.97 kg/m.  Advanced Directives 06/18/2020 03/21/2019 02/10/2017  Does Patient Have a Medical Advance Directive? Yes Yes Yes  Type of Advance Directive Living will;Healthcare Power of [place];Living will [place];Living will  Does patient want to make changes to medical advance directive? No - Patient declined - -  Copy of [place] in Chart? No - copy requested No - copy requested No - copy requested    Current Medications (verified) Outpatient Encounter Medications as of 06/18/2020  Medication Sig   Acetaminophen (TYLENOL PO) Take by mouth daily as needed.    albuterol (PROVENTIL HFA;VENTOLIN HFA) 108 (90 BASE) MCG/ACT inhaler Inhale 2 puffs into the lungs every 6 (six) hours as needed for wheezing.   azelastine (ASTELIN) 0.1 % nasal spray Place into both nostrils at bedtime. Use in each nostril as directed   Calcium Carbonate-Vitamin D (CALCIUM-VITAMIN D) 500-200 MG-UNIT per tablet Take 3 tablets by mouth daily.    Cholecalciferol 4000 units TABS Take 1 tablet by mouth daily.   desonide (DESOWEN) 0.05 % cream Apply topically 2 (two) times daily. (Patient taking differently: Apply topically 2 (two) times daily as needed. )   diclofenac Sodium (VOLTAREN) 1 % GEL APPLY 4 GRAMS 4 TIMES DAILY.   EPINEPHrine (EPIPEN 2-PAK) 0.3 mg/0.3 mL IJ SOAJ injection Inject 0.3 mLs (0.3 mg total) into the  muscle as needed.   famotidine (PEPCID) 40 MG tablet TAKE ONE TABLET AT BEDTIME.   ipratropium (ATROVENT) 0.06 % nasal spray Place 1 spray into the nose 3 (three) times daily as needed for rhinitis.   levocetirizine (XYZAL) 5 MG tablet Take 5 mg by mouth every evening.   levothyroxine (SYNTHROID) 50 MCG tablet TAKE 1 TABLET ONCE DAILY BEFORE BREAKFAST.   LORazepam (ATIVAN) 1 MG tablet Take 2-3 mg at hs and 1-2 mg in the daytime prn   montelukast (SINGULAIR) 10 MG tablet Take 1 tablet (10 mg total) by mouth at bedtime. (Patient taking differently: Take 10 mg by mouth every other day. )   NONFORMULARY OR COMPOUNDED ITEM Estradiol 0.02% vaginal cream S:Insert 41ml vaginally twice a week.   nystatin-triamcinolone ointment (MYCOLOG) Apply 1 application topically 2 (two) times daily. (Patient taking differently: Apply 1 application topically 2 (two) times daily as needed. )   pantoprazole (PROTONIX) 40 MG tablet Take 1 tablet (40 mg total) by mouth 2 (two) times daily before a meal. (Patient taking differently: Take 40 mg by mouth 2 (two) times daily. )   SALINE NASAL SPRAY NA Place into the nose as needed.    simvastatin (ZOCOR) 40 MG tablet TAKE 1 TABLET ONCE DAILY.   traMADol (ULTRAM) 50 MG tablet Take by mouth every 6 (six) hours as needed.   triamcinolone cream (KENALOG) 0.1 % Apply 1 application topically 3 (three) times daily. (Patient taking differently: Apply 1 application topically 3 (three) times daily as needed. )  VESICARE 10 MG tablet Take 1 tablet by mouth as needed.    [DISCONTINUED] budesonide-formoterol (SYMBICORT) 160-4.5 MCG/ACT inhaler Inhale 2 puffs 2 (two) times daily into the lungs. (Patient taking differently: Inhale 2 puffs into the lungs 2 (two) times daily as needed. )   No facility-administered encounter medications on file as of 06/18/2020.    Allergies (verified) Bee venom, Doxycycline, and Neosporin [neomycin-bacitracin zn-polymyx]   History: Past Medical History:    Diagnosis Date   Allergic rhinitis    Atrophic vaginitis    Baker's cyst    Left-Dr. Aluisio   Cataract    Dr. Katy Fitch   Colon polyps    Fibroid    Gastritis    chronic   GERD (gastroesophageal reflux disease)    Heart murmur    Hemorrhoids    Hyperlipidemia    IBS (irritable bowel syndrome)    MVP (mitral valve prolapse)    Antibiotics required for dental procedures   OA (osteoarthritis)    Osteoporosis 06/2018   T score -2.2 stable on Prolia   Overactive bladder    Thyroid disease    Thyroid nodule    Torn meniscus    bilateral   Past Surgical History:  Procedure Laterality Date   CATARACT EXTRACTION, BILATERAL     TONSILLECTOMY     Family History  Problem Relation Age of Onset   Heart disease Mother    Hypertension Mother    Osteoporosis Mother    Congestive Heart Failure Mother    Pancreatic cancer Father    Heart attack Brother    Drug abuse Brother        over dose    Healthy Son    Healthy Son    Colon cancer Neg Hx    Colon polyps Neg Hx    Rectal cancer Neg Hx    Stomach cancer Neg Hx    Social History   Socioeconomic History   Marital status: Married    Spouse name: Not on file   Number of children: 2   Years of education: Not on file   Highest education level: Not on file  Occupational History   Occupation: retired    Fish farm manager: RETIRED  Tobacco Use   Smoking status: Former Smoker   Smokeless tobacco: Never Used  Scientific laboratory technician Use: Never used  Substance and Sexual Activity   Alcohol use: Yes    Comment: Socially   Drug use: No   Sexual activity: Yes    Birth control/protection: Post-menopausal, None    Comment: 1st intercourse 84 yo-Fewer than 5 partners  Other Topics Concern   Not on file  Social History Narrative   Regular exercise-yes   Daily caffeine use   Social Determinants of Health   Financial Resource Strain: Low Risk    Difficulty of Paying Living Expenses: Not hard at all  Food Insecurity: No Food Insecurity    Worried About Charity fundraiser in the Last Year: Never true   Ran Out of Food in the Last Year: Never true  Transportation Needs: No Transportation Needs   Lack of Transportation (Medical): No   Lack of Transportation (Non-Medical): No  Physical Activity: Insufficiently Active   Days of Exercise per Week: 1 day   Minutes of Exercise per Session: 60 min  Stress: No Stress Concern Present   Feeling of Stress : Not at all  Social Connections: Socially Integrated   Frequency of Communication with Friends and Family: More than three  times a week   Frequency of Social Gatherings with Friends and Family: More than three times a week   Attends Religious Services: More than 4 times per year   Active Member of [employer] or Organizations: Yes   Attends Music therapist: More than 4 times per year   Marital Status: Married    Tobacco Counseling Counseling given: No   Clinical Intake:  Pre-visit preparation completed: Yes  Pain : No/denies pain Pain Score: 0-No pain     BMI - recorded: 22.97 Nutritional Status: BMI of 19-24  Normal Nutritional Risks: None Diabetes: No  How often do you need to have someone help you when you read instructions, pamphlets, or other written materials from your doctor or pharmacy?: 1 - Never What is the last grade level you completed in school?: 2 years of College  Diabetic? no  Interpreter Needed?: No  Information entered by :: Daishawn Lauf N. Neidra Girvan, LPN   Activities of Daily Living In your present state of health, do you have any difficulty performing the following activities: 06/18/2020  Hearing? N  Vision? N  Difficulty concentrating or making decisions? N  Walking or climbing stairs? N  Dressing or bathing? N  Doing errands, shopping? N  Preparing Food and eating ? N  Using the Toilet? N  In the past six months, have you accidently leaked urine? N  Do you have problems with loss of bowel control? N  Managing your Medications?  N  Managing your Finances? N  Housekeeping or managing your Housekeeping? N  Some recent data might be hidden    Patient Care Team: Plotnikov, Evie Lacks, MD as PCP - General Bensimhon, Shaune Pascal, MD as PCP - Advanced Heart Failure (Cardiology) Cherylann Banas Cherly Anderson, MD (Inactive) (Obstetrics and Gynecology) Lanie Shipper, MD (Gastroenterology)  Indicate any recent Medical Services you may have received from other than [facility] providers in the past year (date may be approximate).     Assessment:   This is a routine wellness examination for Aditri.  Hearing/Vision screen No exam data present  Dietary issues and exercise activities discussed: Current Exercise Habits: Home exercise routine, Time (Minutes): 60, Frequency (Times/Week): 1, Weekly Exercise (Minutes/Week): 60, Intensity: Mild, Exercise limited by: orthopedic condition(s) (seeing physical therapy once a week for exercise due to stenosis of spine)  Goals       maintain current health status      Increase vegetable and fiber intake.      maintain current health status      Continue to exercise and eat healthy. Enjoy family, friends and life.      Patient Stated (pt-stated)      I would like to get more physically active and start back swimming again.       Depression Screen PHQ 2/9 Scores 06/18/2020 03/21/2019 09/20/2018 02/10/2017 07/07/2016 02/10/2015 10/02/2013  PHQ - 2 Score 1 0 0 0 0 0 0    Fall Risk Fall Risk  06/18/2020 03/21/2019 06/28/2018 02/10/2017 08/03/2016  Falls in the past year? 0 0 No Yes No  Comment - - Emmi Telephone Survey: data to providers prior to load - Emmi Telephone Survey: data to providers prior to load  Number falls in past yr: 0 0 - - -  Injury with Fall? 0 - - No -    Any stairs in or around the home? Yes  If so, are there any without handrails? No  Home free of loose throw rugs in walkways, pet beds, electrical  cords, etc? Yes  Adequate lighting in your home to reduce risk of falls? Yes    ASSISTIVE DEVICES UTILIZED TO PREVENT FALLS:  Life alert? Yes  Use of a cane, walker or w/c? No  Grab bars in the bathroom? Yes  Shower chair or bench in shower? Yes  Elevated toilet seat or a handicapped toilet? Yes   TIMED UP AND GO:  Was the test performed? No .  Length of time to ambulate 10 feet: 0 sec.   Gait steady and fast without use of assistive device  Cognitive Function: MMSE - Mini Mental State Exam 02/10/2017  Orientation to time 5  Orientation to Place 5  Registration 3  Attention/ Calculation 5  Recall 2  Language- name 2 objects 2  Language- repeat 1  Language- follow 3 step command 3  Language- read & follow direction 1  Write a sentence 1  Copy design 1  Total score 29     6CIT Screen 06/18/2020  What Year? 0 points  What month? 0 points  What time? 0 points  Count back from 20 0 points  Months in reverse 0 points  Repeat phrase 0 points  Total Score 0    Immunizations Immunization History  Administered Date(s) Administered   Fluad Quad(high Dose 65+) 08/04/2019   Influenza Whole 09/29/2007, 08/21/2008, 09/02/2010, 07/30/2012   Influenza, High Dose Seasonal PF 09/03/2013, 08/06/2016, 08/20/2017, 09/20/2018   Influenza,inj,Quad PF,6+ Mos 12/17/2014, 07/30/2015   PFIZER SARS-COV-2 Vaccination 12/12/2019, 12/31/2019   Pneumococcal Conjugate-13 01/09/2014   Pneumococcal Polysaccharide-23 09/26/2006, 08/12/2015   Td 04/14/2010   Typhoid Inactivated 02/23/2013   Zoster 12/09/2006    TDAP status: Up to date Flu Vaccine status: Up to date Pneumococcal vaccine status: Up to date Covid-19 vaccine status: Completed vaccines  Qualifies for Shingles Vaccine? Yes   Zostavax completed Yes   Shingrix Completed?: No.    Education has been provided regarding the importance of this vaccine. Patient has been advised to call insurance company to determine out of pocket expense if they have not yet received this vaccine. Advised may also receive vaccine  at local pharmacy or Health Dept. Verbalized acceptance and understanding.  Screening Tests Health Maintenance  Topic Date Due   TETANUS/TDAP  04/14/2020   INFLUENZA VACCINE  06/29/2020   PAP SMEAR-Modifier  06/25/2021   DEXA SCAN  Completed   COVID-19 Vaccine  Completed   PNA vac Low Risk Adult  Completed    Health Maintenance  Health Maintenance Due  Topic Date Due   TETANUS/TDAP  04/14/2020    Colorectal cancer screening: No longer required.  Mammogram status: Completed 09/11/2019. Repeat every year Bone Density status: Completed 07/04/2018. Results reflect: Bone density results: OSTEOPOROSIS. Repeat every 2-3 years.  Lung Cancer Screening: (Low Dose CT Chest recommended if Age 22-80 years, 30 pack-year currently smoking OR have quit w/in 15years.) does not qualify.   Lung Cancer Screening Referral: no  Additional Screening:  Hepatitis C Screening: does not qualify; Completed no  Vision Screening: Recommended annual ophthalmology exams for early detection of glaucoma and other disorders of the eye. Is the patient up to date with their annual eye exam?  Yes  Who is the provider or what is the name of the office in which the patient attends annual eye exams? Maurie Boettcher. Sigmund, MD If pt is not established with a provider, would they like to be referred to a provider to establish care? No .   Dental Screening: Recommended annual dental exams for  proper oral hygiene  Community Resource Referral / Chronic Care Management: CRR required this visit?  No   CCM required this visit?  No      Plan:     I have personally reviewed and noted the following in the patient's chart:   Medical and social history Use of alcohol, tobacco or illicit drugs  Current medications and supplements Functional ability and status Nutritional status Physical activity Advanced directives List of other physicians Hospitalizations, surgeries, and ER visits in previous 12  months Vitals Screenings to include cognitive, depression, and falls Referrals and appointments  In addition, I have reviewed and discussed with patient certain preventive protocols, quality metrics, and best practice recommendations. A written personalized care plan for preventive services as well as general preventive health recommendations were provided to patient.     Sheral Flow, LPN   [id number]   Nurse Notes: n/a   Medical screening examination/treatment/procedure(s) were performed by non-physician practitioner and as supervising physician I was immediately available for consultation/collaboration.  I agree with above. Lew Dawes, MD

## 2020-06-18 NOTE — Progress Notes (Signed)
Subjective:  Patient ID: [name], female    DOB: Jul 18, 1936  Age: 84 y.o. MRN: [id number]  CC: No chief complaint on file.   HPI [name] presents for hypothyroidism, anxiety, insomnia f/u Getting a dog (possibly)  -  A Lindi Adie Spaniel  Outpatient Medications Prior to Visit  Medication Sig Dispense Refill  . Acetaminophen (TYLENOL PO) Take by mouth daily as needed.     [name] albuterol (PROVENTIL HFA;VENTOLIN HFA) 108 (90 BASE) MCG/ACT inhaler Inhale 2 puffs into the lungs every 6 (six) hours as needed for wheezing. 1 Inhaler 3  . azelastine (ASTELIN) 0.1 % nasal spray Place into both nostrils at bedtime. Use in each nostril as directed    . Calcium Carbonate-Vitamin D (CALCIUM-VITAMIN D) 500-200 MG-UNIT per tablet Take 3 tablets by mouth daily.     . Cholecalciferol 4000 units TABS Take 1 tablet by mouth daily.    [name] desonide (DESOWEN) 0.05 % cream Apply topically 2 (two) times daily. (Patient taking differently: Apply topically 2 (two) times daily as needed. ) 30 g 0  . diclofenac Sodium (VOLTAREN) 1 % GEL APPLY 4 GRAMS 4 TIMES DAILY. 200 g 0  . EPINEPHrine (EPIPEN 2-PAK) 0.3 mg/0.3 mL IJ SOAJ injection Inject 0.3 mLs (0.3 mg total) into the muscle as needed. 1 Device 1  . famotidine (PEPCID) 40 MG tablet TAKE ONE TABLET AT BEDTIME. 30 tablet 5  . ipratropium (ATROVENT) 0.06 % nasal spray Place 1 spray into the nose 3 (three) times daily as needed for rhinitis. 15 mL 0  . levocetirizine (XYZAL) 5 MG tablet Take 5 mg by mouth every evening.    [name] levothyroxine (SYNTHROID) 50 MCG tablet TAKE 1 TABLET ONCE DAILY BEFORE BREAKFAST. 30 tablet 11  . LORazepam (ATIVAN) 1 MG tablet Take 2-3 mg at hs and 1-2 mg in the daytime prn 150 tablet 2  . montelukast (SINGULAIR) 10 MG tablet Take 1 tablet (10 mg total) by mouth at bedtime. (Patient taking differently: Take 10 mg by mouth every other day. ) 90 tablet 1  . NONFORMULARY OR COMPOUNDED ITEM Estradiol 0.02% vaginal cream S:Insert 39ml  vaginally twice a week. 90 each 0  . nystatin-triamcinolone ointment (MYCOLOG) Apply 1 application topically 2 (two) times daily. (Patient taking differently: Apply 1 application topically 2 (two) times daily as needed. ) 30 g 1  . pantoprazole (PROTONIX) 40 MG tablet Take 1 tablet (40 mg total) by mouth 2 (two) times daily before a meal. (Patient taking differently: Take 40 mg by mouth 2 (two) times daily. ) 60 tablet 3  . SALINE NASAL SPRAY NA Place into the nose as needed.     . simvastatin (ZOCOR) 40 MG tablet TAKE 1 TABLET ONCE DAILY. 90 tablet 0  . traMADol (ULTRAM) 50 MG tablet Take by mouth every 6 (six) hours as needed.    . triamcinolone cream (KENALOG) 0.1 % Apply 1 application topically 3 (three) times daily. (Patient taking differently: Apply 1 application topically 3 (three) times daily as needed. ) 80 g 3  . VESICARE 10 MG tablet Take 1 tablet by mouth as needed.      No facility-administered medications prior to visit.    ROS: Review of Systems  Constitutional: Negative for activity change, appetite change, chills, fatigue and unexpected weight change.  HENT: Negative for congestion, mouth sores and sinus pressure.   Eyes: Negative for visual disturbance.  Respiratory: Negative for cough and chest tightness.   Gastrointestinal: Negative for abdominal pain and  nausea.  Genitourinary: Negative for difficulty urinating, frequency and vaginal pain.  Musculoskeletal: Negative for back pain and gait problem.  Skin: Negative for pallor and rash.  Neurological: Negative for dizziness, tremors, weakness, numbness and headaches.  Psychiatric/Behavioral: Negative for confusion and sleep disturbance.    Objective:  There were no vitals taken for this visit.  BP Readings from Last 3 Encounters:  06/18/20 118/70  05/12/20 117/70  03/18/20 130/78    Wt Readings from Last 3 Encounters:  06/18/20 117 lb 9.6 oz (53.3 kg)  05/12/20 118 lb (53.5 kg)  03/18/20 118 lb (53.5 kg)     Physical Exam Constitutional:      General: She is not in acute distress.    Appearance: She is well-developed.  HENT:     Head: Normocephalic.     Right Ear: External ear normal.     Left Ear: External ear normal.     Nose: Nose normal.  Eyes:     General:        Right eye: No discharge.        Left eye: No discharge.     Conjunctiva/sclera: Conjunctivae normal.     Pupils: Pupils are equal, round, and reactive to light.  Neck:     Thyroid: No thyromegaly.     Vascular: No JVD.     Trachea: No tracheal deviation.  Cardiovascular:     Rate and Rhythm: Normal rate and regular rhythm.     Heart sounds: Normal heart sounds.  Pulmonary:     Effort: No respiratory distress.     Breath sounds: No stridor. No wheezing.  Abdominal:     General: Bowel sounds are normal. There is no distension.     Palpations: Abdomen is soft. There is no mass.     Tenderness: There is no abdominal tenderness. There is no guarding or rebound.  Musculoskeletal:        General: No tenderness.     Cervical back: Normal range of motion and neck supple.  Lymphadenopathy:     Cervical: No cervical adenopathy.  Skin:    Findings: No erythema or rash.  Neurological:     Cranial Nerves: No cranial nerve deficit.     Motor: No abnormal muscle tone.     Coordination: Coordination normal.     Deep Tendon Reflexes: Reflexes normal.  Psychiatric:        Behavior: Behavior normal.        Thought Content: Thought content normal.        Judgment: Judgment normal.     Lab Results  Component Value Date   WBC 5.6 03/14/2020   HGB 13.6 03/14/2020   HCT 40.2 03/14/2020   PLT 276.0 03/14/2020   GLUCOSE 92 06/13/2020   CHOL 156 12/26/2019   TRIG 124.0 12/26/2019   HDL 40.70 12/26/2019   LDLDIRECT 109.0 06/19/2019   LDLCALC 90 12/26/2019   ALT 16 12/26/2019   AST 17 12/26/2019   NA 137 06/13/2020   K 5.0 06/13/2020   CL 101 06/13/2020   CREATININE 0.69 06/13/2020   BUN 16 06/13/2020   CO2 26  06/13/2020   TSH 1.98 06/13/2020   HGBA1C 5.5 06/13/2020    MR BRAIN W WO CONTRAST  Result Date: 03/07/2020 CLINICAL DATA:  Meningioma. EXAM: MRI HEAD WITHOUT AND WITH CONTRAST TECHNIQUE: Multiplanar, multiecho pulse sequences of the brain and surrounding structures were obtained without and with intravenous contrast. CONTRAST:  76mL MULTIHANCE GADOBENATE DIMEGLUMINE 529 MG/ML IV SOLN COMPARISON:  MR head without and with contrast 07/11/2019 FINDINGS: Brain: Posterior left frontal meningioma is stable, measuring 16 x 16 x 9 mm. No significant associated vasogenic edema is present. No other dural-based mass lesions are present. Acute infarct, hemorrhage or parenchymal mass is present. The ventricles are of normal size. No significant extraaxial fluid collection is present. The internal auditory canals are within normal limits. The brainstem and cerebellum are within normal limits. Postcontrast images demonstrate no other pathologic enhancement. Vascular: Flow is present in the major intracranial arteries. Skull and upper cervical spine: The craniocervical junction is normal. Upper cervical spine is within normal limits. Marrow signal is unremarkable. Sinuses/Orbits: The paranasal sinuses and mastoid air cells are clear. Bilateral lens replacements are noted. Globes and orbits are otherwise unremarkable. IMPRESSION: 1. Stable left frontal meningioma. 2. Otherwise normal MRI of the brain. Electronically Signed   By: San Morelle M.D.   On: 03/07/2020 06:04    Assessment & Plan:   There are no diagnoses linked to this encounter.   No orders of the defined types were placed in this encounter.    Follow-up: No follow-ups on file.  Walker Kehr, MD

## 2020-06-18 NOTE — Patient Instructions (Signed)
[name] , Thank you for taking time to come for your Medicare Wellness Visit. I appreciate your ongoing commitment to your health goals. Please review the following plan we discussed and let me know if I can assist you in the future.   Screening recommendations/referrals: Colonoscopy: no repeat due to age 84: 09/11/2019; due every 1-2 years Bone Density: 07/04/2018; due every 2-3 years Recommended yearly ophthalmology/optometry visit for glaucoma screening and checkup Recommended yearly dental visit for hygiene and checkup  Vaccinations: Influenza vaccine: 08/04/2019 Pneumococcal vaccine: completed Tdap vaccine: 04/14/2010; overdue Shingles vaccine: never done   Covid-19: completed  Advanced directives: Please bring a copy of your health care power of attorney and living will to the office at your convenience.  Conditions/risks identified: Yes; Please continue to do your personal lifestyle choices by: daily care of teeth and gums, regular physical activity (goal should be 5 days a week for 30 minutes), eat a healthy diet, avoid tobacco and drug use, limiting any alcohol intake, taking a low-dose aspirin (if not allergic or have been advised by your provider otherwise) and taking vitamins and minerals as recommended by your provider. Continue doing brain stimulating activities (puzzles, reading, adult coloring books, staying active) to keep memory sharp. Continue to eat heart healthy diet (full of fruits, vegetables, whole grains, lean protein, water--limit salt, fat, and sugar intake) and increase physical activity as tolerated.  Next appointment: Please schedule your next Medicare Wellness Visit with your Nurse Health Advisor in 1 year.  Preventive Care 7 Years and Older, Female Preventive care refers to lifestyle choices and visits with your health care provider that can promote health and wellness. What does preventive care include?  A yearly physical exam. This is also called an  annual well check.  Dental exams once or twice a year.  Routine eye exams. Ask your health care provider how often you should have your eyes checked.  Personal lifestyle choices, including:  Daily care of your teeth and gums.  Regular physical activity.  Eating a healthy diet.  Avoiding tobacco and drug use.  Limiting alcohol use.  Practicing safe sex.  Taking low-dose aspirin every day.  Taking vitamin and mineral supplements as recommended by your health care provider. What happens during an annual well check? The services and screenings done by your health care provider during your annual well check will depend on your age, overall health, lifestyle risk factors, and family history of disease. Counseling  Your health care provider may ask you questions about your:  Alcohol use.  Tobacco use.  Drug use.  Emotional well-being.  Home and relationship well-being.  Sexual activity.  Eating habits.  History of falls.  Memory and ability to understand (cognition).  Work and work Statistician.  Reproductive health. Screening  You may have the following tests or measurements:  Height, weight, and BMI.  Blood pressure.  Lipid and cholesterol levels. These may be checked every 5 years, or more frequently if you are over 86 years old.  Skin check.  Lung cancer screening. You may have this screening every year starting at age 97 if you have a 30-pack-year history of smoking and currently smoke or have quit within the past 15 years.  Fecal occult blood test (FOBT) of the stool. You may have this test every year starting at age 29.  Flexible sigmoidoscopy or colonoscopy. You may have a sigmoidoscopy every 5 years or a colonoscopy every 10 years starting at age 77.  Hepatitis C blood test.  Hepatitis  B blood test.  Sexually transmitted disease (STD) testing.  Diabetes screening. This is done by checking your blood sugar (glucose) after you have not eaten for  a while (fasting). You may have this done every 1-3 years.  Bone density scan. This is done to screen for osteoporosis. You may have this done starting at age 75.  Mammogram. This may be done every 1-2 years. Talk to your health care provider about how often you should have regular mammograms. Talk with your health care provider about your test results, treatment options, and if necessary, the need for more tests. Vaccines  Your health care provider may recommend certain vaccines, such as:  Influenza vaccine. This is recommended every year.  Tetanus, diphtheria, and acellular pertussis (Tdap, Td) vaccine. You may need a Td booster every 10 years.  Zoster vaccine. You may need this after age 25.  Pneumococcal 13-valent conjugate (PCV13) vaccine. One dose is recommended after age 77.  Pneumococcal polysaccharide (PPSV23) vaccine. One dose is recommended after age 77. Talk to your health care provider about which screenings and vaccines you need and how often you need them. This information is not intended to replace advice given to you by your health care provider. Make sure you discuss any questions you have with your health care provider. Document Released: 12/12/2015 Document Revised: 08/04/2016 Document Reviewed: 09/16/2015 Elsevier Interactive Patient Education  2017 [employer] Prevention in the Home Falls can cause injuries. They can happen to people of all ages. There are many things you can do to make your home safe and to help prevent falls. What can I do on the outside of my home?  Regularly fix the edges of walkways and driveways and fix any cracks.  Remove anything that might make you trip as you walk through a door, such as a raised step or threshold.  Trim any bushes or trees on the path to your home.  Use bright outdoor lighting.  Clear any walking paths of anything that might make someone trip, such as rocks or tools.  Regularly check to see if handrails  are loose or broken. Make sure that both sides of any steps have handrails.  Any raised decks and porches should have guardrails on the edges.  Have any leaves, snow, or ice cleared regularly.  Use sand or salt on walking paths during winter.  Clean up any spills in your garage right away. This includes oil or grease spills. What can I do in the bathroom?  Use night lights.  Install grab bars by the toilet and in the tub and shower. Do not use towel bars as grab bars.  Use non-skid mats or decals in the tub or shower.  If you need to sit down in the shower, use a plastic, non-slip stool.  Keep the floor dry. Clean up any water that spills on the floor as soon as it happens.  Remove soap buildup in the tub or shower regularly.  Attach bath mats securely with double-sided non-slip rug tape.  Do not have throw rugs and other things on the floor that can make you trip. What can I do in the bedroom?  Use night lights.  Make sure that you have a light by your bed that is easy to reach.  Do not use any sheets or blankets that are too big for your bed. They should not hang down onto the floor.  Have a firm chair that has side arms. You can use this for  support while you get dressed.  Do not have throw rugs and other things on the floor that can make you trip. What can I do in the kitchen?  Clean up any spills right away.  Avoid walking on wet floors.  Keep items that you use a lot in easy-to-reach places.  If you need to reach something above you, use a strong step stool that has a grab bar.  Keep electrical cords out of the way.  Do not use floor polish or wax that makes floors slippery. If you must use wax, use non-skid floor wax.  Do not have throw rugs and other things on the floor that can make you trip. What can I do with my stairs?  Do not leave any items on the stairs.  Make sure that there are handrails on both sides of the stairs and use them. Fix handrails  that are broken or loose. Make sure that handrails are as long as the stairways.  Check any carpeting to make sure that it is firmly attached to the stairs. Fix any carpet that is loose or worn.  Avoid having throw rugs at the top or bottom of the stairs. If you do have throw rugs, attach them to the floor with carpet tape.  Make sure that you have a light switch at the top of the stairs and the bottom of the stairs. If you do not have them, ask someone to add them for you. What else can I do to help prevent falls?  Wear shoes that:  Do not have high heels.  Have rubber bottoms.  Are comfortable and fit you well.  Are closed at the toe. Do not wear sandals.  If you use a stepladder:  Make sure that it is fully opened. Do not climb a closed stepladder.  Make sure that both sides of the stepladder are locked into place.  Ask someone to hold it for you, if possible.  Clearly mark and make sure that you can see:  Any grab bars or handrails.  First and last steps.  Where the edge of each step is.  Use tools that help you move around (mobility aids) if they are needed. These include:  Canes.  Walkers.  Scooters.  Crutches.  Turn on the lights when you go into a dark area. Replace any light bulbs as soon as they burn out.  Set up your furniture so you have a clear path. Avoid moving your furniture around.  If any of your floors are uneven, fix them.  If there are any pets around you, be aware of where they are.  Review your medicines with your doctor. Some medicines can make you feel dizzy. This can increase your chance of falling. Ask your doctor what other things that you can do to help prevent falls. This information is not intended to replace advice given to you by your health care provider. Make sure you discuss any questions you have with your health care provider. Document Released: 09/11/2009 Document Revised: 04/22/2016 Document Reviewed: 12/20/2014 Elsevier  Interactive Patient Education  2017 [employer].

## 2020-06-19 DIAGNOSIS — M545 Low back pain: Secondary | ICD-10-CM | POA: Diagnosis not present

## 2020-06-19 DIAGNOSIS — M4986 Spondylopathy in diseases classified elsewhere, lumbar region: Secondary | ICD-10-CM | POA: Diagnosis not present

## 2020-06-19 DIAGNOSIS — M5136 Other intervertebral disc degeneration, lumbar region: Secondary | ICD-10-CM | POA: Diagnosis not present

## 2020-06-19 DIAGNOSIS — R293 Abnormal posture: Secondary | ICD-10-CM | POA: Diagnosis not present

## 2020-06-19 DIAGNOSIS — M2569 Stiffness of other specified joint, not elsewhere classified: Secondary | ICD-10-CM | POA: Diagnosis not present

## 2020-06-20 DIAGNOSIS — M542 Cervicalgia: Secondary | ICD-10-CM | POA: Diagnosis not present

## 2020-06-22 NOTE — Assessment & Plan Note (Signed)
Monitor blood pressure at home.

## 2020-06-22 NOTE — Assessment & Plan Note (Signed)
Per GYN

## 2020-06-22 NOTE — Assessment & Plan Note (Addendum)
Lorazepam as needed Lorazepam  - tolerance has developed  Potential benefits of a long term benzodiazepines  use as well as potential risks  and complications were explained to the patient and were aknowledged.

## 2020-06-22 NOTE — Assessment & Plan Note (Signed)
Tramadol as needed.  Rare use.  Potential benefits of a long term opioids use as well as potential risks (i.e. addiction risk, apnea etc) and complications (i.e. Somnolence, constipation and others) were explained to the patient and were aknowledged. 

## 2020-06-22 NOTE — Assessment & Plan Note (Signed)
On simvastatin.   

## 2020-06-23 ENCOUNTER — Other Ambulatory Visit: Payer: Self-pay | Admitting: Internal Medicine

## 2020-06-25 ENCOUNTER — Other Ambulatory Visit: Payer: Self-pay | Admitting: Internal Medicine

## 2020-06-26 DIAGNOSIS — R293 Abnormal posture: Secondary | ICD-10-CM | POA: Diagnosis not present

## 2020-06-26 DIAGNOSIS — M545 Low back pain: Secondary | ICD-10-CM | POA: Diagnosis not present

## 2020-06-26 DIAGNOSIS — M4986 Spondylopathy in diseases classified elsewhere, lumbar region: Secondary | ICD-10-CM | POA: Diagnosis not present

## 2020-06-26 DIAGNOSIS — M5136 Other intervertebral disc degeneration, lumbar region: Secondary | ICD-10-CM | POA: Diagnosis not present

## 2020-06-26 DIAGNOSIS — M2569 Stiffness of other specified joint, not elsewhere classified: Secondary | ICD-10-CM | POA: Diagnosis not present

## 2020-06-27 ENCOUNTER — Other Ambulatory Visit: Payer: Self-pay | Admitting: Internal Medicine

## 2020-06-27 MED ORDER — LORAZEPAM 1 MG PO TABS: ORAL_TABLET | ORAL | 2 refills | Status: DC

## 2020-06-27 NOTE — Telephone Encounter (Signed)
[place] Controlled Database Checked Last filled: 05/12/2020 (150) LOV w/you: 06/18/2020 Next appt w/you: 09/22/2020

## 2020-06-27 NOTE — Telephone Encounter (Signed)
    Patient states she has no medication remaining

## 2020-06-27 NOTE — Telephone Encounter (Signed)
   Patient calling upset, requesting refill today for LORazepam (ATIVAN) 1 MG tablet

## 2020-06-30 ENCOUNTER — Other Ambulatory Visit: Payer: Self-pay | Admitting: Orthopedic Surgery

## 2020-06-30 DIAGNOSIS — M542 Cervicalgia: Secondary | ICD-10-CM

## 2020-06-30 NOTE — Progress Notes (Deleted)
Office Visit Note  Patient: [name]             Date of Birth: 1936/11/08           MRN: [id number]             PCP: Cassandria Anger, MD Referring: Cassandria Anger, MD Visit Date: 07/07/2020 Occupation: @GUAROCC @  Subjective:  No chief complaint on file.   History of Present Illness: [name] is a 84 y.o. female ***   Activities of Daily Living:  Patient reports morning stiffness for *** {minute/hour:19697}.   Patient {ACTIONS;DENIES/REPORTS:21021675::"Denies"} nocturnal pain.  Difficulty dressing/grooming: {ACTIONS;DENIES/REPORTS:21021675::"Denies"} Difficulty climbing stairs: {ACTIONS;DENIES/REPORTS:21021675::"Denies"} Difficulty getting out of chair: {ACTIONS;DENIES/REPORTS:21021675::"Denies"} Difficulty using hands for taps, buttons, cutlery, and/or writing: {ACTIONS;DENIES/REPORTS:21021675::"Denies"}  No Rheumatology ROS completed.   PMFS History:  Patient Active Problem List   Diagnosis Date Noted  . Arthralgia 03/18/2020  . RUQ pain 01/02/2020  . Cholelithiasis 09/09/2019  . Ear pain, bilateral 03/21/2019  . Neck pain 12/26/2018  . Acute pain of left shoulder 12/26/2018  . Anxiety 08/22/2018  . Contact dermatitis and eczema 06/14/2018  . Other constipation 11/11/2016  . Low back pain 11/10/2016  . Leg abrasion 08/12/2016  . Contusion of right knee 08/12/2016  . Sinusitis, chronic 08/06/2016  . Rash and nonspecific skin eruption 04/20/2016  . Aortic valve sclerosis 03/12/2015  . Contusion of left chest wall 01/09/2015  . Fatigue 09/08/2014  . Postherpetic neuralgia 10/02/2013  . Impacted cerumen of right ear 07/16/2013  . Headache 07/10/2013  . Aortic stenosis 04/19/2013  . Insomnia 01/24/2013  . Cough 08/07/2012  . URI (upper respiratory infection) 08/03/2012  . Pruritus of skin 03/11/2012  . MVP (mitral valve prolapse)   . Multiple thyroid nodules 10/04/2011  . Herpes zoster 09/07/2011  . Essential hypertension, benign 07/27/2011  .  Atrophic vaginitis   . Well adult exam 03/15/2011  . Hyperlipemia, mixed 03/15/2011  . OVERACTIVE BLADDER 09/02/2010  . Hypothyroidism 03/03/2010  . CALF PAIN, LEFT 10/27/2009  . Belching 07/21/2009  . EAR PAIN 12/19/2008  . Allergic rhinitis 12/19/2008  . Irritable bowel syndrome 09/04/2008  . PERSONAL HX COLONIC POLYPS 09/04/2008  . GASTRITIS, CHRONIC 09/03/2008  . DUODENITIS, WITHOUT HEMORRHAGE 09/03/2008  . TIBIALIS TENDINITIS 03/20/2008  . OSTEOARTHRITIS 02/25/2008  . SWEATING 02/21/2008  . LACTOSE INTOLERANCE 11/20/2007  . Dyslipidemia 11/20/2007  . GERD 11/20/2007  . Osteoporosis 11/20/2007    Past Medical History:  Diagnosis Date  . Allergic rhinitis   . Atrophic vaginitis   . Baker's cyst    Left-Dr. Aluisio  . Cataract    Dr. Katy Fitch  . Colon polyps   . Fibroid   . Gastritis    chronic  . GERD (gastroesophageal reflux disease)   . Heart murmur   . Hemorrhoids   . Hyperlipidemia   . IBS (irritable bowel syndrome)   . MVP (mitral valve prolapse)    Antibiotics required for dental procedures  . OA (osteoarthritis)   . Osteoporosis 06/2018   T score -2.2 stable on Prolia  . Overactive bladder   . Thyroid disease   . Thyroid nodule   . Torn meniscus    bilateral    Family History  Problem Relation Age of Onset  . Heart disease Mother   . Hypertension Mother   . Osteoporosis Mother   . Congestive Heart Failure Mother   . Pancreatic cancer Father   . Heart attack Brother   . Drug abuse Brother  over dose   . Healthy Son   . Healthy Son   . Colon cancer Neg Hx   . Colon polyps Neg Hx   . Rectal cancer Neg Hx   . Stomach cancer Neg Hx    Past Surgical History:  Procedure Laterality Date  . CATARACT EXTRACTION, BILATERAL    . TONSILLECTOMY     Social History   Social History Narrative   Regular exercise-yes   Daily caffeine use   Immunization History  Administered Date(s) Administered  . Fluad Quad(high Dose 65+) 08/04/2019  .  Influenza Whole 09/29/2007, 08/21/2008, 09/02/2010, 07/30/2012  . Influenza, High Dose Seasonal PF 09/03/2013, 08/06/2016, 08/20/2017, 09/20/2018  . Influenza,inj,Quad PF,6+ Mos 12/17/2014, 07/30/2015  . PFIZER SARS-COV-2 Vaccination 12/12/2019, 12/31/2019  . Pneumococcal Conjugate-13 01/09/2014  . Pneumococcal Polysaccharide-23 09/26/2006, 08/12/2015  . Td 04/14/2010  . Tdap 06/18/2020  . Typhoid Inactivated 02/23/2013  . Zoster 12/09/2006     Objective: Vital Signs: There were no vitals taken for this visit.   Physical Exam   Musculoskeletal Exam: ***  CDAI Exam: CDAI Score: -- Patient Global: --; Provider Global: -- Swollen: --; Tender: -- Joint Exam 07/07/2020   No joint exam has been documented for this visit   There is currently no information documented on the homunculus. Go to the Rheumatology activity and complete the homunculus joint exam.  Investigation: No additional findings.  Imaging: No results found.  Recent Labs: Lab Results  Component Value Date   WBC 5.6 03/14/2020   HGB 13.6 03/14/2020   PLT 276.0 03/14/2020   NA 137 06/13/2020   K 5.0 06/13/2020   CL 101 06/13/2020   CO2 26 06/13/2020   GLUCOSE 92 06/13/2020   BUN 16 06/13/2020   CREATININE 0.69 06/13/2020   BILITOT 0.3 12/26/2019   ALKPHOS 63 12/26/2019   AST 17 12/26/2019   ALT 16 12/26/2019   PROT 7.5 12/26/2019   ALBUMIN 3.9 12/26/2019   CALCIUM 9.2 06/13/2020   GFRAA 91 08/22/2008    Speciality Comments: No specialty comments available.  Procedures:  No procedures performed Allergies: Bee venom, Doxycycline, and Neosporin [neomycin-bacitracin zn-polymyx]   Assessment / Plan:     Visit Diagnoses: No diagnosis found.  Orders: No orders of the defined types were placed in this encounter.  No orders of the defined types were placed in this encounter.   Face-to-face time spent with patient was *** minutes. Greater than 50% of time was spent in counseling and coordination of  care.  Follow-Up Instructions: No follow-ups on file.   Bo Merino, MD  Note - This record has been created using Editor, commissioning.  Chart creation errors have been sought, but may not always  have been located. Such creation errors do not reflect on  the standard of medical care.

## 2020-07-02 ENCOUNTER — Other Ambulatory Visit: Payer: Self-pay

## 2020-07-02 ENCOUNTER — Encounter: Payer: Self-pay | Admitting: Obstetrics and Gynecology

## 2020-07-02 ENCOUNTER — Ambulatory Visit (INDEPENDENT_AMBULATORY_CARE_PROVIDER_SITE_OTHER): Payer: Medicare Other | Admitting: Obstetrics and Gynecology

## 2020-07-02 VITALS — BP 128/80 | Ht 60.0 in | Wt 116.0 lb

## 2020-07-02 DIAGNOSIS — N952 Postmenopausal atrophic vaginitis: Secondary | ICD-10-CM | POA: Diagnosis not present

## 2020-07-02 DIAGNOSIS — Z124 Encounter for screening for malignant neoplasm of cervix: Secondary | ICD-10-CM

## 2020-07-02 DIAGNOSIS — Z01419 Encounter for gynecological examination (general) (routine) without abnormal findings: Secondary | ICD-10-CM | POA: Diagnosis not present

## 2020-07-02 DIAGNOSIS — M810 Age-related osteoporosis without current pathological fracture: Secondary | ICD-10-CM | POA: Diagnosis not present

## 2020-07-02 DIAGNOSIS — R350 Frequency of micturition: Secondary | ICD-10-CM

## 2020-07-02 LAB — URINALYSIS, COMPLETE W/RFL CULTURE
Bacteria, UA: NONE SEEN /HPF
Bilirubin Urine: NEGATIVE
Glucose, UA: NEGATIVE
Hgb urine dipstick: NEGATIVE
Hyaline Cast: NONE SEEN /LPF
Ketones, ur: NEGATIVE
Leukocyte Esterase: NEGATIVE
Nitrites, Initial: NEGATIVE
Protein, ur: NEGATIVE
RBC / HPF: NONE SEEN /HPF (ref 0–2)
Specific Gravity, Urine: 1.02 (ref 1.001–1.03)
WBC, UA: NONE SEEN /HPF (ref 0–5)
pH: 7 (ref 5.0–8.0)

## 2020-07-02 LAB — NO CULTURE INDICATED

## 2020-07-02 MED ORDER — TRIAMCINOLONE ACETONIDE 0.1 % EX CREA: 1.0000 "application " | TOPICAL_CREAM | Freq: Three times a day (TID) | CUTANEOUS | 2 refills | Status: DC | PRN

## 2020-07-02 MED ORDER — NONFORMULARY OR COMPOUNDED ITEM: 3 refills | Status: DC

## 2020-07-02 NOTE — Progress Notes (Signed)
Pamla Pangle 1936-09-13 [id number]  SUBJECTIVE:  84 y.o. [id number] female for annual routine gynecologic exam and Pap smear. She has no gynecologic concerns.  Current Outpatient Medications  Medication Sig Dispense Refill  . Acetaminophen (TYLENOL PO) Take by mouth daily as needed.     [name] albuterol (PROVENTIL HFA;VENTOLIN HFA) 108 (90 BASE) MCG/ACT inhaler Inhale 2 puffs into the lungs every 6 (six) hours as needed for wheezing. 1 Inhaler 3  . azelastine (ASTELIN) 0.1 % nasal spray Place into both nostrils at bedtime. Use in each nostril as directed    . Calcium Carbonate-Vitamin D (CALCIUM-VITAMIN D) 500-200 MG-UNIT per tablet Take 3 tablets by mouth daily.     . Cholecalciferol 4000 units TABS Take 1 tablet by mouth daily.    [name] desonide (DESOWEN) 0.05 % cream Apply topically 2 (two) times daily. (Patient taking differently: Apply topically 2 (two) times daily as needed. ) 30 g 0  . diclofenac Sodium (VOLTAREN) 1 % GEL APPLY 4 GRAMS 4 TIMES DAILY. 200 g 0  . EPINEPHrine (EPIPEN 2-PAK) 0.3 mg/0.3 mL IJ SOAJ injection Inject 0.3 mLs (0.3 mg total) into the muscle as needed. 1 Device 1  . famotidine (PEPCID) 40 MG tablet TAKE ONE TABLET AT BEDTIME. 30 tablet 5  . ipratropium (ATROVENT) 0.06 % nasal spray Place 1 spray into the nose 3 (three) times daily as needed for rhinitis. 15 mL 0  . levocetirizine (XYZAL) 5 MG tablet Take 5 mg by mouth every evening.    [name] levothyroxine (SYNTHROID) 50 MCG tablet TAKE 1 TABLET ONCE DAILY BEFORE BREAKFAST. 30 tablet 11  . LORazepam (ATIVAN) 1 MG tablet Take 2-3 mg at hs and 1-2 mg in the daytime prn 150 tablet 2  . montelukast (SINGULAIR) 10 MG tablet Take 1 tablet (10 mg total) by mouth at bedtime. (Patient taking differently: Take 10 mg by mouth every other day. ) 90 tablet 1  . NONFORMULARY OR COMPOUNDED ITEM Estradiol 0.02% vaginal cream S:Insert 22ml vaginally twice a week. 90 each 0  . nystatin-triamcinolone ointment (MYCOLOG) Apply 1 application topically  2 (two) times daily. (Patient taking differently: Apply 1 application topically 2 (two) times daily as needed. ) 30 g 1  . pantoprazole (PROTONIX) 40 MG tablet Take 1 tablet (40 mg total) by mouth 2 (two) times daily before a meal. (Patient taking differently: Take 40 mg by mouth 2 (two) times daily. ) 60 tablet 3  . SALINE NASAL SPRAY NA Place into the nose as needed.     . simvastatin (ZOCOR) 40 MG tablet TAKE 1 TABLET ONCE DAILY. 90 tablet 0  . traMADol (ULTRAM) 50 MG tablet Take by mouth every 6 (six) hours as needed.    . triamcinolone cream (KENALOG) 0.1 % Apply 1 application topically 3 (three) times daily. (Patient taking differently: Apply 1 application topically 3 (three) times daily as needed. ) 80 g 3  . VESICARE 10 MG tablet Take 1 tablet by mouth as needed.      No current facility-administered medications for this visit.   Allergies: Bee venom, Doxycycline, and Neosporin [neomycin-bacitracin zn-polymyx]  No LMP recorded. Patient is postmenopausal.  Past medical history,surgical history, problem list, medications, allergies, family history and social history were all reviewed and documented as reviewed in the EPIC chart.  ROS:  Feeling well. No dyspnea or chest pain on exertion.  No abdominal pain, change in bowel habits, black or bloody stools.  No urinary tract symptoms. GYN ROS: no abnormal bleeding, pelvic pain  or discharge, no breast pain or new or enlarging lumps on self exam.No neurological complaints.   OBJECTIVE:  BP 128/80 (BP Location: Right Arm, Patient Position: Sitting, Cuff Size: Normal)   Ht 5' (1.524 m)   Wt 116 lb (52.6 kg)   BMI 22.65 kg/m  The patient appears well, alert, oriented x 3, in no distress. ENT normal.  Neck supple. No cervical or supraclavicular adenopathy or thyromegaly.  Lungs are clear, good air entry, no wheezes, rhonchi or rales. S1 and S2 normal, no murmurs, regular rate and rhythm.  Abdomen soft without tenderness, guarding, mass or  organomegaly.  Neurological is normal, no focal findings.  BREAST EXAM: breasts appear normal, no suspicious masses, no skin or nipple changes or axillary nodes  PELVIC EXAM: VULVA: normal appearing vulva with no masses, tenderness or lesions, VAGINA: normal appearing vagina with normal color and discharge, no lesions, CERVIX: normal appearing cervix without discharge or lesions, UTERUS: uterus is normal size, shape, consistency and nontender, ADNEXA: normal adnexa in size, nontender and no masses, PAP: Pap smear done today, thin-prep method  Chaperone: KimAlexis Bonham present during the examination  ASSESSMENT:  84 y.o. [id number] here for annual gynecologic exam  PLAN:   1. Postmenopausal/vaginal atrophy.  U[facility] prefilled estradiol syringes [facility] twice weekly, doing well, no vaginal bleeding.  She understands the risks of hormone absorption with systemic effects including thrombotic diseases, heart attack, stroke, DVT, PE, breast cancer issue, uterine cancer.  Refill x 1 year sent in to staff to call in.  Also uses triamcinolone 0.1% ointment for external irritation occasionally.  80 g tube with 2 refills sent in to pharmacy. 2. Pap smear 05/2019.  No significant history of abnormal Pap smears.  She is not comfortable with discontinuing Pap smear surveillance or the extended intervals, and she desires to continue to get annual Pap smears so we collected one today. 3. Mammogram 08/2019.  Normal breast exam today.  She is reminded to schedule an annual mammogram this year when due. 4. Intermittent urinary frequency.  Has had the issue for years and has been seen by urology.  Check UA today. 5. Colonoscopy 2013.  Recommended that she follow up at the recommended interval.   6. Osteoporosis.  DEXA 06/2018 with T score -2.2.  On Prolia x 4 years.  She will continue on Prolia and repeat the DEXA this year which she will plan to schedule.  The test is ordered.   7. Health  maintenance.  No labs today as she normally has these completed elsewhere.   Return annually or sooner, prn.  Joseph Pierini MD 07/02/20

## 2020-07-03 DIAGNOSIS — Z01419 Encounter for gynecological examination (general) (routine) without abnormal findings: Secondary | ICD-10-CM | POA: Diagnosis not present

## 2020-07-03 NOTE — Addendum Note (Signed)
Addended by: Lorine Bears on: 07/03/2020 08:51 AM   Modules accepted: Orders

## 2020-07-04 ENCOUNTER — Encounter: Payer: Self-pay | Admitting: Obstetrics and Gynecology

## 2020-07-04 LAB — PAP IG W/ RFLX HPV ASCU

## 2020-07-05 NOTE — Progress Notes (Signed)
Office Visit Note  Patient: [name]             Date of Birth: [birth year]           MRN: [id number]             PCP: Cassandria Anger, MD Referring: Cassandria Anger, MD Visit Date: 07/08/2020 Occupation: @GUAROCC @  Subjective:  Neck pain   History of Present Illness: [name] is a 84 y.o. female with history of degenerative disc disease and osteoarthritis.  She states 2 weeks ago she turned her head and felt sudden popping in the base of her neck.  She states she was having severe pain and was seen by Dr. Percell Miller.  To give her a prednisone taper without much relief.  She had MRI of the cervical spine yesterday.  She continues to have some lower back pain.  She states she had some physical therapy sessions and has some exercises to do at home.  She denied denies any other joint discomfort.  Activities of Daily Living:  Patient reports morning stiffness for 30 minutes.   Patient Reports nocturnal pain.  Difficulty dressing/grooming: Denies Difficulty climbing stairs: Denies Difficulty getting out of chair: Denies Difficulty using hands for taps, buttons, cutlery, and/or writing: Reports  No Rheumatology ROS completed.   PMFS History:  Patient Active Problem List   Diagnosis Date Noted  . Arthralgia 03/18/2020  . RUQ pain 01/02/2020  . Cholelithiasis 09/09/2019  . Ear pain, bilateral 03/21/2019  . Neck pain 12/26/2018  . Acute pain of left shoulder 12/26/2018  . Anxiety 08/22/2018  . Contact dermatitis and eczema 06/14/2018  . Other constipation 11/11/2016  . Low back pain 11/10/2016  . Leg abrasion 08/12/2016  . Contusion of right knee 08/12/2016  . Sinusitis, chronic 08/06/2016  . Rash and nonspecific skin eruption 04/20/2016  . Aortic valve sclerosis 03/12/2015  . Contusion of left chest wall 01/09/2015  . Fatigue 09/08/2014  . Postherpetic neuralgia 10/02/2013  . Impacted cerumen of right ear 07/16/2013  . Headache 07/10/2013  . Aortic stenosis  04/19/2013  . Insomnia 01/24/2013  . Cough 08/07/2012  . URI (upper respiratory infection) 08/03/2012  . Pruritus of skin 03/11/2012  . MVP (mitral valve prolapse)   . Multiple thyroid nodules 10/04/2011  . Herpes zoster 09/07/2011  . Essential hypertension, benign 07/27/2011  . Atrophic vaginitis   . Well adult exam 03/15/2011  . Hyperlipemia, mixed 03/15/2011  . OVERACTIVE BLADDER 09/02/2010  . Hypothyroidism 03/03/2010  . CALF PAIN, LEFT 10/27/2009  . Belching 07/21/2009  . EAR PAIN 12/19/2008  . Allergic rhinitis 12/19/2008  . Irritable bowel syndrome 09/04/2008  . PERSONAL HX COLONIC POLYPS 09/04/2008  . GASTRITIS, CHRONIC 09/03/2008  . DUODENITIS, WITHOUT HEMORRHAGE 09/03/2008  . TIBIALIS TENDINITIS 03/20/2008  . OSTEOARTHRITIS 02/25/2008  . SWEATING 02/21/2008  . LACTOSE INTOLERANCE 11/20/2007  . Dyslipidemia 11/20/2007  . GERD 11/20/2007  . Osteoporosis 11/20/2007    Past Medical History:  Diagnosis Date  . Allergic rhinitis   . Atrophic vaginitis   . Baker's cyst    Left-Dr. Aluisio  . Cataract    Dr. Katy Fitch  . Colon polyps   . Fibroid   . Gastritis    chronic  . GERD (gastroesophageal reflux disease)   . Heart murmur   . Hemorrhoids   . Hyperlipidemia   . IBS (irritable bowel syndrome)   . MVP (mitral valve prolapse)    Antibiotics required for dental procedures  . OA (osteoarthritis)   .  Osteoporosis 06/2018   T score -2.2 stable on Prolia  . Overactive bladder   . Thyroid disease   . Thyroid nodule   . Torn meniscus    bilateral    Family History  Problem Relation Age of Onset  . Heart disease Mother   . Hypertension Mother   . Osteoporosis Mother   . Congestive Heart Failure Mother   . Pancreatic cancer Father   . Heart attack Brother   . Drug abuse Brother        over dose   . Healthy Son   . Healthy Son   . Colon cancer Neg Hx   . Colon polyps Neg Hx   . Rectal cancer Neg Hx   . Stomach cancer Neg Hx    Past Surgical History:    Procedure Laterality Date  . CATARACT EXTRACTION, BILATERAL    . TONSILLECTOMY     Social History   Social History Narrative   Regular exercise-yes   Daily caffeine use   Immunization History  Administered Date(s) Administered  . Fluad Quad(high Dose 65+) 08/04/2019  . Influenza Whole 09/29/2007, 08/21/2008, 09/02/2010, 07/30/2012  . Influenza, High Dose Seasonal PF 09/03/2013, 08/06/2016, 08/20/2017, 09/20/2018  . Influenza,inj,Quad PF,6+ Mos 12/17/2014, 07/30/2015  . PFIZER SARS-COV-2 Vaccination 12/12/2019, 12/31/2019  . Pneumococcal Conjugate-13 01/09/2014  . Pneumococcal Polysaccharide-23 09/26/2006, 08/12/2015  . Td 04/14/2010  . Tdap 06/18/2020  . Typhoid Inactivated 02/23/2013  . Zoster 12/09/2006     Objective: Vital Signs: BP 125/70 (BP Location: Left Arm, Patient Position: Sitting, Cuff Size: Small)   Pulse 72   Resp 12   Ht 5' 0.05" (1.525 m)   Wt 117 lb (53.1 kg)   BMI 22.81 kg/m    Physical Exam Vitals and nursing note reviewed.  Constitutional:      Appearance: She is well-developed.  HENT:     Head: Normocephalic and atraumatic.  Eyes:     Conjunctiva/sclera: Conjunctivae normal.  Cardiovascular:     Rate and Rhythm: Normal rate and regular rhythm.     Heart sounds: Normal heart sounds.  Pulmonary:     Effort: Pulmonary effort is normal.     Breath sounds: Normal breath sounds.  Abdominal:     General: Bowel sounds are normal.     Palpations: Abdomen is soft.  Musculoskeletal:     Cervical back: Normal range of motion.  Lymphadenopathy:     Cervical: No cervical adenopathy.  Skin:    General: Skin is warm and dry.     Capillary Refill: Capillary refill takes less than 2 seconds.  Neurological:     Mental Status: She is alert and oriented to person, place, and time.  Psychiatric:        Behavior: Behavior normal.      Musculoskeletal Exam: She has very limited painful range of motion of her cervical spine.  She describes pain at the  base of her skull.  Shoulder joints and elbow joints with good range of motion.  She has some subtle thickening over MCP joints with no tenderness.  PIP and DIP thickening was noted.  Hip joints and knee joints with good range of motion.  She has severe osteoarthritic changes in her feet but no synovitis was noted. CDAI Exam: CDAI Score: -- Patient Global: --; Provider Global: -- Swollen: --; Tender: -- Joint Exam 07/08/2020   No joint exam has been documented for this visit   There is currently no information documented on the homunculus. Go to  the Rheumatology activity and complete the homunculus joint exam.  Investigation: No additional findings.  Imaging: MR CERVICAL SPINE WO CONTRAST  Result Date: 07/07/2020 CLINICAL DATA:  84 year old female with chronic neck pain, increasing over the past 6 weeks a especially with head turning to the right. EXAM: MRI CERVICAL SPINE WITHOUT CONTRAST TECHNIQUE: Multiplanar, multisequence MR imaging of the cervical spine was performed. No intravenous contrast was administered. COMPARISON:  Cervical spine radiographs 12/26/2018. Brain MRI 03/06/2020. face CT without contrast 08/13/2016. FINDINGS: Alignment: Chronic straightening of cervical lordosis with mild degenerative appearing anterolisthesis of C2 on C3, stable since 2020. Similar mild anterolisthesis of C3 on C4, although that level one which is is ankylosed (see below). Vertebrae: Altered appearance of the craniocervical junction since the April brain MRI (series 5, image 7 and series 4, image 7) appears related to a bulky fluid collection which is insinuated about the anterior C1 odontoid and posterior C1 occipital joint space. There is mild extension into the dorsal posterior fossa which is new since April (same images). Trace bilateral fluid also in the bilateral occipital condyle-C1 and C1-C2 lateral articulations, greater on the right. But despite this new fluid collection there is no associated  marrow edema at the skull base or in the upper cervical vertebrae. There is chronic appearing remodeling of the odontoid with partially calcified chronic dorsal pannus which was present on the 2017 CT. Also, the fluid collections are fairly sharply demarcated, no definite regional soft tissue inflammation associated. 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 Degenerative endplate marrow signal changes in the cervical spine but normal background bone marrow signal. No marrow edema identified. Cord: No cervical spinal cord signal abnormality despite multilevel spinal stenosis with some cord mass effect. No abnormal fluid collection within the spinal canal. Posterior Fossa, vertebral arteries, paraspinal tissues: Preserved major vascular flow voids in the neck with a partially retropharyngeal course of both carotids. No neck soft tissue inflammation identified. Negative visible lung apices. Abnormality of the posterior fossa new since 03/06/2020, but otherwise stable, negative visible brain parenchyma. Disc levels: C1-C2: Mild mass effect on the thecal sac primarily due to chronic ligamentous hypertrophy posterior to the odontoid, but no significant cervicomedullary stenosis. C2-C3: Mild spinal stenosis related to moderate posterior element hypertrophy including the ligament flavum. Mild left and moderate to severe right C3 foraminal stenosis. C3-C4: Interbody ankylosis. Mild spinal stenosis. Mild to moderate right C4 foraminal stenosis. C4-C5: Severe disc space loss. Circumferential disc osteophyte complex with mild posterior element hypertrophy. Mild to moderate spinal stenosis and spinal cord mass effect (series 7, image 11). Mild left and severe right C5 foraminal stenosis. C5-C6: Ankylosis. Rightward endplate spurring and facet hypertrophy. Mild spinal stenosis and spinal cord mass effect. Moderate to severe right C6 foraminal stenosis. C6-C7: Disc space loss with circumferential disc  osteophyte complex. Mild posterior element hypertrophy. Mild spinal stenosis. Mild if any cord mass effect. Moderate to severe bilateral C7 foraminal stenosis. C7-T1: Disc space loss. Left eccentric circumferential disc osteophyte complex. Mild to moderate posterior element hypertrophy. No spinal stenosis. Moderate to severe bilateral C8 foraminal stenosis. No visible upper thoracic spinal stenosis. IMPRESSION: 1. Bulky and insinuating new fluid collection at the cranial cervical junction since the brain MRI on 03/06/2020. There is even some extension of fluid into the posterior fossa of the posterior fossa. Favored differential considerations are large degenerative synovial cyst and/or joint effusion. Acquired Pseudomeningocele is also in the differential diagnosis. Abscess was considered but is felt unlikely given the absence of  adjacent bone marrow edema and regional soft tissue inflammation. 2. Underlying chronic degeneration and ligamentous hypertrophy about the odontoid process. But no significant cervicomedullary junction stenosis. 3. 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 Some associated spinal cord mass effect but no cord signal abnormality. 4. Moderate to severe degenerative neural foraminal stenosis at the right C3, right C5, right C6, bilateral C7 and C8 nerve levels. Electronically Signed   By: Genevie Ann M.D.   On: 07/07/2020 20:49    Recent Labs: Lab Results  Component Value Date   WBC 5.6 03/14/2020   HGB 13.6 03/14/2020   PLT 276.0 03/14/2020   NA 137 06/13/2020   K 5.0 06/13/2020   CL 101 06/13/2020   CO2 26 06/13/2020   GLUCOSE 92 06/13/2020   BUN 16 06/13/2020   CREATININE 0.69 06/13/2020   BILITOT 0.3 12/26/2019   ALKPHOS 63 12/26/2019   AST 17 12/26/2019   ALT 16 12/26/2019   PROT 7.5 12/26/2019   ALBUMIN 3.9  12/26/2019   CALCIUM 9.2 06/13/2020   GFRAA 91 08/22/2008    Speciality Comments: No specialty comments available.  Procedures:  No procedures performed Allergies: Bee venom, Doxycycline, and Neosporin [neomycin-bacitracin zn-polymyx]   Assessment / Plan:     Visit Diagnoses: DDD (degenerative disc disease), cervical -I reviewed the C-spine MRI findings with the patient.  She is having a lot of discomfort.  She had no response to the steroid taper.  She has seen Dr. Sherwood Gambler in the past.  I contacted Dr. Vertell Limber.  He kindly accepted the referral and his office will contact the patient as soon as possible.  Patient is aware of it.  Plan: Ambulatory referral to Neurosurgery  DDD (degenerative disc disease), lumbar - Advised physical therapy at [facility] at the last visit.  She states she had some physical therapy she has strengths.  She is also doing some exercises at home now.  Chronic SI joint pain - X-rays were unremarkable.  Pain in both hands -he has some second and third MCP joint thickening but no tenderness.  I will obtain labs and x-rays.  She will tomorrow for x-rays of her bilateral hands as the radiology tech was not available today.  Plan: Sedimentation rate, Uric acid, Rheumatoid factor, Cyclic citrul peptide antibody, IgG, 14-3-3 eta Protein  Primary osteoarthritis of both hands-she has osteoarthritis in her bilateral hands.  Primary osteoarthritis of both feet-she continues to have some discomfort in her feet but no synovitis was noted.  Will obtain x-ray of bilateral feet tomorrow as well.  Age-related osteoporosis without current pathological fracture - She is on Prolia, calcium and vitamin D.  Essential hypertension, benign  History of hyperlipidemia  MVP (mitral valve prolapse)  Aortic valve sclerosis  History of gastroesophageal reflux (GERD)  History of IBS  Hx of colonic polyps  Calculus of gallbladder without cholecystitis without obstruction  Multiple  thyroid nodules  Hypothyroidism due to acquired atrophy of thyroid  Orders: Orders Placed This Encounter  Procedures  . Sedimentation rate  . Uric acid  . Rheumatoid factor  . Cyclic citrul peptide antibody, IgG  . 14-3-3 eta Protein   No orders of the defined types were placed in this encounter.    Follow-Up Instructions: Return in about 4 weeks (around 08/05/2020) for DDD,OA.   Bo Merino, MD  Note - This record has been created  using Editor, commissioning.  Chart creation errors have been sought, but may not always  have been located. Such creation errors do not reflect on  the standard of medical care.

## 2020-07-07 ENCOUNTER — Ambulatory Visit: Payer: Medicare Other | Admitting: Rheumatology

## 2020-07-07 ENCOUNTER — Other Ambulatory Visit: Payer: Self-pay

## 2020-07-07 ENCOUNTER — Ambulatory Visit
Admission: RE | Admit: 2020-07-07 | Discharge: 2020-07-07 | Disposition: A | Discharge status: Home / Self Care | Payer: Medicare Other | Source: Ambulatory Visit | Attending: Orthopedic Surgery | Admitting: Orthopedic Surgery

## 2020-07-07 DIAGNOSIS — M4802 Spinal stenosis, cervical region: Secondary | ICD-10-CM | POA: Diagnosis not present

## 2020-07-07 DIAGNOSIS — M19071 Primary osteoarthritis, right ankle and foot: Secondary | ICD-10-CM

## 2020-07-07 DIAGNOSIS — Z8639 Personal history of other endocrine, nutritional and metabolic disease: Secondary | ICD-10-CM

## 2020-07-07 DIAGNOSIS — K8020 Calculus of gallbladder without cholecystitis without obstruction: Secondary | ICD-10-CM

## 2020-07-07 DIAGNOSIS — M5030 Other cervical disc degeneration, unspecified cervical region: Secondary | ICD-10-CM

## 2020-07-07 DIAGNOSIS — M810 Age-related osteoporosis without current pathological fracture: Secondary | ICD-10-CM

## 2020-07-07 DIAGNOSIS — M5136 Other intervertebral disc degeneration, lumbar region: Secondary | ICD-10-CM

## 2020-07-07 DIAGNOSIS — E034 Atrophy of thyroid (acquired): Secondary | ICD-10-CM

## 2020-07-07 DIAGNOSIS — Z86010 Personal history of colonic polyps: Secondary | ICD-10-CM

## 2020-07-07 DIAGNOSIS — Z8719 Personal history of other diseases of the digestive system: Secondary | ICD-10-CM

## 2020-07-07 DIAGNOSIS — M19041 Primary osteoarthritis, right hand: Secondary | ICD-10-CM

## 2020-07-07 DIAGNOSIS — I358 Other nonrheumatic aortic valve disorders: Secondary | ICD-10-CM

## 2020-07-07 DIAGNOSIS — E042 Nontoxic multinodular goiter: Secondary | ICD-10-CM

## 2020-07-07 DIAGNOSIS — I10 Essential (primary) hypertension: Secondary | ICD-10-CM

## 2020-07-07 DIAGNOSIS — I341 Nonrheumatic mitral (valve) prolapse: Secondary | ICD-10-CM

## 2020-07-07 DIAGNOSIS — M542 Cervicalgia: Secondary | ICD-10-CM

## 2020-07-07 DIAGNOSIS — G8929 Other chronic pain: Secondary | ICD-10-CM

## 2020-07-08 ENCOUNTER — Ambulatory Visit (INDEPENDENT_AMBULATORY_CARE_PROVIDER_SITE_OTHER): Payer: Medicare Other | Admitting: Rheumatology

## 2020-07-08 ENCOUNTER — Telehealth: Payer: Self-pay | Admitting: Rheumatology

## 2020-07-08 ENCOUNTER — Encounter: Payer: Self-pay | Admitting: Rheumatology

## 2020-07-08 VITALS — BP 125/70 | HR 72 | Resp 12 | Ht 60.05 in | Wt 117.0 lb

## 2020-07-08 DIAGNOSIS — Z860100 Personal history of colon polyps, unspecified: Secondary | ICD-10-CM

## 2020-07-08 DIAGNOSIS — M533 Sacrococcygeal disorders, not elsewhere classified: Secondary | ICD-10-CM

## 2020-07-08 DIAGNOSIS — Z8719 Personal history of other diseases of the digestive system: Secondary | ICD-10-CM

## 2020-07-08 DIAGNOSIS — M19041 Primary osteoarthritis, right hand: Secondary | ICD-10-CM | POA: Diagnosis not present

## 2020-07-08 DIAGNOSIS — M5030 Other cervical disc degeneration, unspecified cervical region: Secondary | ICD-10-CM | POA: Diagnosis not present

## 2020-07-08 DIAGNOSIS — M19072 Primary osteoarthritis, left ankle and foot: Secondary | ICD-10-CM

## 2020-07-08 DIAGNOSIS — M19071 Primary osteoarthritis, right ankle and foot: Secondary | ICD-10-CM | POA: Diagnosis not present

## 2020-07-08 DIAGNOSIS — M5136 Other intervertebral disc degeneration, lumbar region: Secondary | ICD-10-CM | POA: Diagnosis not present

## 2020-07-08 DIAGNOSIS — I341 Nonrheumatic mitral (valve) prolapse: Secondary | ICD-10-CM | POA: Diagnosis not present

## 2020-07-08 DIAGNOSIS — M810 Age-related osteoporosis without current pathological fracture: Secondary | ICD-10-CM | POA: Diagnosis not present

## 2020-07-08 DIAGNOSIS — M79641 Pain in right hand: Secondary | ICD-10-CM | POA: Diagnosis not present

## 2020-07-08 DIAGNOSIS — I10 Essential (primary) hypertension: Secondary | ICD-10-CM

## 2020-07-08 DIAGNOSIS — E042 Nontoxic multinodular goiter: Secondary | ICD-10-CM

## 2020-07-08 DIAGNOSIS — I358 Other nonrheumatic aortic valve disorders: Secondary | ICD-10-CM

## 2020-07-08 DIAGNOSIS — Z8639 Personal history of other endocrine, nutritional and metabolic disease: Secondary | ICD-10-CM | POA: Diagnosis not present

## 2020-07-08 DIAGNOSIS — E034 Atrophy of thyroid (acquired): Secondary | ICD-10-CM

## 2020-07-08 DIAGNOSIS — M51369 Other intervertebral disc degeneration, lumbar region without mention of lumbar back pain or lower extremity pain: Secondary | ICD-10-CM

## 2020-07-08 DIAGNOSIS — G8929 Other chronic pain: Secondary | ICD-10-CM

## 2020-07-08 DIAGNOSIS — K8020 Calculus of gallbladder without cholecystitis without obstruction: Secondary | ICD-10-CM

## 2020-07-08 DIAGNOSIS — M19042 Primary osteoarthritis, left hand: Secondary | ICD-10-CM

## 2020-07-08 DIAGNOSIS — M79642 Pain in left hand: Secondary | ICD-10-CM | POA: Diagnosis not present

## 2020-07-08 NOTE — Telephone Encounter (Signed)
Kassidy from Dr. Melven Sartorius office called stating they did not receive the referral for the patient.  Please refax to:  [phone]  Amalia Greenhouse states she did try to call the patient and left voicemail.  If you have have any questions, please call back at [phone]

## 2020-07-08 NOTE — Telephone Encounter (Signed)
Attempted to contact patient and left message on machine to advise patient to call Dr. Melven Sartorius office to schedule.

## 2020-07-08 NOTE — Telephone Encounter (Signed)
Re-faxed referral office note, MRI and demographics.

## 2020-07-09 ENCOUNTER — Ambulatory Visit: Payer: Medicare Other | Admitting: Rheumatology

## 2020-07-09 ENCOUNTER — Encounter: Payer: Self-pay | Admitting: Rheumatology

## 2020-07-10 DIAGNOSIS — M79641 Pain in right hand: Secondary | ICD-10-CM | POA: Diagnosis not present

## 2020-07-10 DIAGNOSIS — M19071 Primary osteoarthritis, right ankle and foot: Secondary | ICD-10-CM | POA: Diagnosis not present

## 2020-07-10 DIAGNOSIS — M19072 Primary osteoarthritis, left ankle and foot: Secondary | ICD-10-CM | POA: Diagnosis not present

## 2020-07-10 DIAGNOSIS — M79642 Pain in left hand: Secondary | ICD-10-CM | POA: Diagnosis not present

## 2020-07-11 ENCOUNTER — Ambulatory Visit (INDEPENDENT_AMBULATORY_CARE_PROVIDER_SITE_OTHER): Payer: Medicare Other | Admitting: Psychology

## 2020-07-11 ENCOUNTER — Telehealth: Payer: Self-pay | Admitting: Rheumatology

## 2020-07-11 DIAGNOSIS — F4320 Adjustment disorder, unspecified: Secondary | ICD-10-CM | POA: Diagnosis not present

## 2020-07-11 NOTE — Telephone Encounter (Signed)
Patient states she spoke with Dr. Melven Sartorius office and she has an appointment to be seen on 07/14/2020 at 1:00 pm.

## 2020-07-11 NOTE — Telephone Encounter (Signed)
[name] from [facility] Dr. Melven Sartorius office left a voicemail stating "we received the urgent request to get the patient scheduled in our office with Dr. Vertell Limber by Dr. Estanislado Pandy."  [name] states they called and got her scheduled, however, she did not show up for her appointment.  [name] states she has tried to reach out to the patient and there was no answer and no response back.  [name] states "I just wanted to make you aware that she never came in."  [name] states she can be reached at [phone] Ext [phone]

## 2020-07-11 NOTE — Telephone Encounter (Signed)
Please try reaching out to the patient and then respond to [facility]

## 2020-07-11 NOTE — Telephone Encounter (Signed)
Contacted patient about her appointment with Dr. Melven Sartorius office. Patient states she was advised her appointment is for Monday morning 07/14/2020. Patient states her son is flying in so he may attend the appointment with her. Patient is going to reach out to [place] and then call the office back.

## 2020-07-14 DIAGNOSIS — R030 Elevated blood-pressure reading, without diagnosis of hypertension: Secondary | ICD-10-CM | POA: Diagnosis not present

## 2020-07-14 DIAGNOSIS — M542 Cervicalgia: Secondary | ICD-10-CM | POA: Diagnosis not present

## 2020-07-16 ENCOUNTER — Telehealth: Payer: Self-pay | Admitting: Rheumatology

## 2020-07-16 ENCOUNTER — Ambulatory Visit: Payer: Self-pay

## 2020-07-16 ENCOUNTER — Other Ambulatory Visit: Payer: Self-pay | Admitting: Internal Medicine

## 2020-07-16 ENCOUNTER — Other Ambulatory Visit: Payer: Self-pay | Admitting: *Deleted

## 2020-07-16 DIAGNOSIS — M19042 Primary osteoarthritis, left hand: Secondary | ICD-10-CM

## 2020-07-16 DIAGNOSIS — M19041 Primary osteoarthritis, right hand: Secondary | ICD-10-CM

## 2020-07-16 DIAGNOSIS — M19072 Primary osteoarthritis, left ankle and foot: Secondary | ICD-10-CM

## 2020-07-16 DIAGNOSIS — M79642 Pain in left hand: Secondary | ICD-10-CM

## 2020-07-16 DIAGNOSIS — M79641 Pain in right hand: Secondary | ICD-10-CM

## 2020-07-16 DIAGNOSIS — M19071 Primary osteoarthritis, right ankle and foot: Secondary | ICD-10-CM

## 2020-07-16 NOTE — Telephone Encounter (Signed)
Patient came today to get x-rays of her bilateral hands and bilateral feet. X-ray right hand showed juxta-articular osteopenia narrowing of all MCP joints PIP and DIP joints.  CMC, intercarpal and radiocarpal joint space narrowing was noted.  Cystic versus erosive changes were noted in the ulnar styloid.  No comparison films were available.  Impression: These findings are consistent with rheumatoid arthritis and osteoarthritis overlap.  X-ray left hand 2 views: Juxta-articular osteopenia was noted.  Severe CMC, PIP and DIP narrowing was noted.  Erosive change of first DIP joint was noted.  Narrowing of all MCP joints was noted.  Intercarpal and radiocarpal joint space narrowing was noted.  No erosive changes were noted.  No comparison films were available.  Impression: These findings are consistent with rheumatoid arthritis and osteoarthritis overlap.   X-ray of right foot 2 views: Juxta-articular osteopenia was noted.  First MTP, PIP and DIP narrowing was noted.  Intertarsal narrowing with dorsal spurring was noted.  No tibiotalar or subtalar joint space narrowing was noted.  No erosive changes were noted.  Inferior calcaneal spur was noted.  Impression: These findings are consistent with rheumatoid arthritis and osteoarthritis overlap.  X-ray of left foot 2 views: Juxta-articular osteopenia was noted.  First MTP narrowing and subluxation was noted.  Second MTP narrowing and subluxation was noted.  Narrowing of all PIP and DIP joints was noted.  Dorsal spurring was noted.  No intertarsal, tibiotalar or subtalar joint space narrowing was noted.  Inferior calcaneal spur was noted.  No erosive changes were noted.  Radiographic progression was noted on comparison with x-rays of 2015.  Impression: These findings are consistent with rheumatoid arthritis and osteoarthritis overlap.  Bo Merino, MD

## 2020-07-17 ENCOUNTER — Other Ambulatory Visit: Payer: Medicare Other

## 2020-07-17 ENCOUNTER — Other Ambulatory Visit: Payer: Self-pay

## 2020-07-17 DIAGNOSIS — R739 Hyperglycemia, unspecified: Secondary | ICD-10-CM

## 2020-07-17 LAB — COMPLETE METABOLIC PANEL WITH GFR
AG Ratio: 1.6 (calc) (ref 1.0–2.5)
ALT: 33 U/L — ABNORMAL HIGH (ref 6–29)
AST: 25 U/L (ref 10–35)
Albumin: 4.5 g/dL (ref 3.6–5.1)
Alkaline phosphatase (APISO): 35 U/L — ABNORMAL LOW (ref 37–153)
BUN: 19 mg/dL (ref 7–25)
CO2: 24 mmol/L (ref 20–32)
Calcium: 9.3 mg/dL (ref 8.6–10.4)
Chloride: 98 mmol/L (ref 98–110)
Creat: 0.74 mg/dL (ref 0.60–0.88)
GFR, Est African American: 87 mL/min/{1.73_m2} (ref >=60)
GFR, Est Non African American: 75 mL/min/{1.73_m2} (ref >=60)
Globulin: 2.8 g/dL (calc) (ref 1.9–3.7)
Glucose, Bld: 85 mg/dL (ref 65–99)
Potassium: 4.6 mmol/L (ref 3.5–5.3)
Sodium: 133 mmol/L — ABNORMAL LOW (ref 135–146)
Total Bilirubin: 0.6 mg/dL (ref 0.2–1.2)
Total Protein: 7.3 g/dL (ref 6.1–8.1)

## 2020-07-17 NOTE — Progress Notes (Signed)
Please see x-ray results and note on 07/16/2020, bilateral hands and bilateral feet.

## 2020-07-18 LAB — CYCLIC CITRUL PEPTIDE ANTIBODY, IGG: Cyclic Citrullin Peptide Ab: <16 UNITS

## 2020-07-18 LAB — RHEUMATOID FACTOR: Rheumatoid fact SerPl-aCnc: <14 IU/mL (ref <14)

## 2020-07-18 LAB — URIC ACID: Uric Acid, Serum: 4.9 mg/dL (ref 2.5–7.0)

## 2020-07-18 LAB — SEDIMENTATION RATE: Sed Rate: 14 mm/h (ref 0–30)

## 2020-07-18 LAB — 14-3-3 ETA PROTEIN: 14-3-3 eta Protein: <0.2 ng/mL (ref <0.2)

## 2020-07-18 NOTE — Telephone Encounter (Signed)
[place] GIVEN 04/24/2020 NEXT INJECTION 10/26/2020

## 2020-07-20 ENCOUNTER — Other Ambulatory Visit: Payer: Medicare Other

## 2020-07-24 NOTE — Progress Notes (Deleted)
Office Visit Note  Patient: [name]             Date of Birth: March 02, 1936           MRN: [id number]             PCP: Cassandria Anger, MD Referring: Cassandria Anger, MD Visit Date: 08/07/2020 Occupation: @GUAROCC @  Subjective:  No chief complaint on file.   History of Present Illness: [name] is a 84 y.o. female ***   Activities of Daily Living:  Patient reports morning stiffness for *** {minute/hour:19697}.   Patient {ACTIONS;DENIES/REPORTS:21021675::"Denies"} nocturnal pain.  Difficulty dressing/grooming: {ACTIONS;DENIES/REPORTS:21021675::"Denies"} Difficulty climbing stairs: {ACTIONS;DENIES/REPORTS:21021675::"Denies"} Difficulty getting out of chair: {ACTIONS;DENIES/REPORTS:21021675::"Denies"} Difficulty using hands for taps, buttons, cutlery, and/or writing: {ACTIONS;DENIES/REPORTS:21021675::"Denies"}  No Rheumatology ROS completed.   PMFS History:  Patient Active Problem List   Diagnosis Date Noted  . Arthralgia 03/18/2020  . RUQ pain 01/02/2020  . Cholelithiasis 09/09/2019  . Ear pain, bilateral 03/21/2019  . Neck pain 12/26/2018  . Acute pain of left shoulder 12/26/2018  . Anxiety 08/22/2018  . Contact dermatitis and eczema 06/14/2018  . Other constipation 11/11/2016  . Low back pain 11/10/2016  . Leg abrasion 08/12/2016  . Contusion of right knee 08/12/2016  . Sinusitis, chronic 08/06/2016  . Rash and nonspecific skin eruption 04/20/2016  . Aortic valve sclerosis 03/12/2015  . Contusion of left chest wall 01/09/2015  . Fatigue 09/08/2014  . Postherpetic neuralgia 10/02/2013  . Impacted cerumen of right ear 07/16/2013  . Headache 07/10/2013  . Aortic stenosis 04/19/2013  . Insomnia 01/24/2013  . Cough 08/07/2012  . URI (upper respiratory infection) 08/03/2012  . Pruritus of skin 03/11/2012  . MVP (mitral valve prolapse)   . Multiple thyroid nodules 10/04/2011  . Herpes zoster 09/07/2011  . Essential hypertension, benign 07/27/2011  .  Atrophic vaginitis   . Well adult exam 03/15/2011  . Hyperlipemia, mixed 03/15/2011  . OVERACTIVE BLADDER 09/02/2010  . Hypothyroidism 03/03/2010  . CALF PAIN, LEFT 10/27/2009  . Belching 07/21/2009  . EAR PAIN 12/19/2008  . Allergic rhinitis 12/19/2008  . Irritable bowel syndrome 09/04/2008  . PERSONAL HX COLONIC POLYPS 09/04/2008  . GASTRITIS, CHRONIC 09/03/2008  . DUODENITIS, WITHOUT HEMORRHAGE 09/03/2008  . TIBIALIS TENDINITIS 03/20/2008  . OSTEOARTHRITIS 02/25/2008  . SWEATING 02/21/2008  . LACTOSE INTOLERANCE 11/20/2007  . Dyslipidemia 11/20/2007  . GERD 11/20/2007  . Osteoporosis 11/20/2007    Past Medical History:  Diagnosis Date  . Allergic rhinitis   . Atrophic vaginitis   . Baker's cyst    Left-Dr. Aluisio  . Cataract    Dr. Katy Fitch  . Colon polyps   . Fibroid   . Gastritis    chronic  . GERD (gastroesophageal reflux disease)   . Heart murmur   . Hemorrhoids   . Hyperlipidemia   . IBS (irritable bowel syndrome)   . MVP (mitral valve prolapse)    Antibiotics required for dental procedures  . OA (osteoarthritis)   . Osteoporosis 06/2018   T score -2.2 stable on Prolia  . Overactive bladder   . Thyroid disease   . Thyroid nodule   . Torn meniscus    bilateral    Family History  Problem Relation Age of Onset  . Heart disease Mother   . Hypertension Mother   . Osteoporosis Mother   . Congestive Heart Failure Mother   . Pancreatic cancer Father   . Heart attack Brother   . Drug abuse Brother  over dose   . Healthy Son   . Healthy Son   . Colon cancer Neg Hx   . Colon polyps Neg Hx   . Rectal cancer Neg Hx   . Stomach cancer Neg Hx    Past Surgical History:  Procedure Laterality Date  . CATARACT EXTRACTION, BILATERAL    . TONSILLECTOMY     Social History   Social History Narrative   Regular exercise-yes   Daily caffeine use   Immunization History  Administered Date(s) Administered  . Fluad Quad(high Dose 65+) 08/04/2019  .  Influenza Whole 09/29/2007, 08/21/2008, 09/02/2010, 07/30/2012  . Influenza, High Dose Seasonal PF 09/03/2013, 08/06/2016, 08/20/2017, 09/20/2018  . Influenza,inj,Quad PF,6+ Mos 12/17/2014, 07/30/2015  . PFIZER SARS-COV-2 Vaccination 12/12/2019, 12/31/2019  . Pneumococcal Conjugate-13 01/09/2014  . Pneumococcal Polysaccharide-23 09/26/2006, 08/12/2015  . Td 04/14/2010  . Tdap 06/18/2020  . Typhoid Inactivated 02/23/2013  . Zoster 12/09/2006     Objective: Vital Signs: There were no vitals taken for this visit.   Physical Exam   Musculoskeletal Exam: ***  CDAI Exam: CDAI Score: -- Patient Global: --; Provider Global: -- Swollen: --; Tender: -- Joint Exam 08/07/2020   No joint exam has been documented for this visit   There is currently no information documented on the homunculus. Go to the Rheumatology activity and complete the homunculus joint exam.  Investigation: No additional findings.  Imaging: MR CERVICAL SPINE WO CONTRAST  Result Date: 07/07/2020 CLINICAL DATA:  84 year old female with chronic neck pain, increasing over the past 6 weeks a especially with head turning to the right. EXAM: MRI CERVICAL SPINE WITHOUT CONTRAST TECHNIQUE: Multiplanar, multisequence MR imaging of the cervical spine was performed. No intravenous contrast was administered. COMPARISON:  Cervical spine radiographs 12/26/2018. Brain MRI 03/06/2020. face CT without contrast 08/13/2016. FINDINGS: Alignment: Chronic straightening of cervical lordosis with mild degenerative appearing anterolisthesis of C2 on C3, stable since 2020. Similar mild anterolisthesis of C3 on C4, although that level one which is is ankylosed (see below). Vertebrae: Altered appearance of the craniocervical junction since the April brain MRI (series 5, image 7 and series 4, image 7) appears related to a bulky fluid collection which is insinuated about the anterior C1 odontoid and posterior C1 occipital joint space. There is mild  extension into the dorsal posterior fossa which is new since April (same images). Trace bilateral fluid also in the bilateral occipital condyle-C1 and C1-C2 lateral articulations, greater on the right. But despite this new fluid collection there is no associated marrow edema at the skull base or in the upper cervical vertebrae. There is chronic appearing remodeling of the odontoid with partially calcified chronic dorsal pannus which was present on the 2017 CT. Also, the fluid collections are fairly sharply demarcated, no definite regional soft tissue inflammation associated. 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 Degenerative endplate marrow signal changes in the cervical spine but normal background bone marrow signal. No marrow edema identified. Cord: No cervical spinal cord signal abnormality despite multilevel spinal stenosis with some cord mass effect. No abnormal fluid collection within the spinal canal. Posterior Fossa, vertebral arteries, paraspinal tissues: Preserved major vascular flow voids in the neck with a partially retropharyngeal course of both carotids. No neck soft tissue inflammation identified. Negative visible lung apices. Abnormality of the posterior fossa new since 03/06/2020, but otherwise stable, negative visible brain parenchyma. Disc levels: C1-C2: Mild mass effect on the thecal sac primarily due to chronic ligamentous hypertrophy posterior to the odontoid, but no significant  cervicomedullary stenosis. C2-C3: Mild spinal stenosis related to moderate posterior element hypertrophy including the ligament flavum. Mild left and moderate to severe right C3 foraminal stenosis. C3-C4: Interbody ankylosis. Mild spinal stenosis. Mild to moderate right C4 foraminal stenosis. C4-C5: Severe disc space loss. Circumferential disc osteophyte complex with mild posterior element hypertrophy. Mild to moderate spinal stenosis and spinal cord mass effect (series 7, image 11). Mild  left and severe right C5 foraminal stenosis. C5-C6: Ankylosis. Rightward endplate spurring and facet hypertrophy. Mild spinal stenosis and spinal cord mass effect. Moderate to severe right C6 foraminal stenosis. C6-C7: Disc space loss with circumferential disc osteophyte complex. Mild posterior element hypertrophy. Mild spinal stenosis. Mild if any cord mass effect. Moderate to severe bilateral C7 foraminal stenosis. C7-T1: Disc space loss. Left eccentric circumferential disc osteophyte complex. Mild to moderate posterior element hypertrophy. No spinal stenosis. Moderate to severe bilateral C8 foraminal stenosis. No visible upper thoracic spinal stenosis. IMPRESSION: 1. Bulky and insinuating new fluid collection at the cranial cervical junction since the brain MRI on 03/06/2020. There is even some extension of fluid into the posterior fossa of the posterior fossa. Favored differential considerations are large degenerative synovial cyst and/or joint effusion. Acquired Pseudomeningocele is also in the differential diagnosis. Abscess was considered but is felt unlikely given the absence of adjacent bone marrow edema and regional soft tissue inflammation. 2. Underlying chronic degeneration and ligamentous hypertrophy about the odontoid process. But no significant cervicomedullary junction stenosis. 3. 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 Some associated spinal cord mass effect but no cord signal abnormality. 4. Moderate to severe degenerative neural foraminal stenosis at the right C3, right C5, right C6, bilateral C7 and C8 nerve levels. Electronically Signed   By: Genevie Ann M.D.   On: 07/07/2020 20:49    Recent Labs: Lab Results  Component Value Date   WBC 5.6 03/14/2020   HGB 13.6 03/14/2020   PLT 276.0 03/14/2020   NA 133 (L) 07/17/2020   K 4.6  07/17/2020   CL 98 07/17/2020   CO2 24 07/17/2020   GLUCOSE 85 07/17/2020   BUN 19 07/17/2020   CREATININE 0.74 07/17/2020   BILITOT 0.6 07/17/2020   ALKPHOS 63 12/26/2019   AST 25 07/17/2020   ALT 33 (H) 07/17/2020   PROT 7.3 07/17/2020   ALBUMIN 3.9 12/26/2019   CALCIUM 9.3 07/17/2020   GFRAA 87 07/17/2020    Speciality Comments: No specialty comments available.  Procedures:  No procedures performed Allergies: Bee venom, Doxycycline, and Neosporin [neomycin-bacitracin zn-polymyx]   Assessment / Plan:     Visit Diagnoses: No diagnosis found.  Orders: No orders of the defined types were placed in this encounter.  No orders of the defined types were placed in this encounter.   Face-to-face time spent with patient was *** minutes. Greater than 50% of time was spent in counseling and coordination of care.  Follow-Up Instructions: No follow-ups on file.   Earnestine Mealing, CMA  Note - This record has been created using Editor, commissioning.  Chart creation errors have been sought, but may not always  have been located. Such creation errors do not reflect on  the standard of medical care.

## 2020-07-26 ENCOUNTER — Other Ambulatory Visit: Payer: Medicare Other

## 2020-07-29 DIAGNOSIS — Z23 Encounter for immunization: Secondary | ICD-10-CM | POA: Diagnosis not present

## 2020-07-31 DIAGNOSIS — Z85828 Personal history of other malignant neoplasm of skin: Secondary | ICD-10-CM | POA: Diagnosis not present

## 2020-07-31 DIAGNOSIS — L859 Epidermal thickening, unspecified: Secondary | ICD-10-CM | POA: Diagnosis not present

## 2020-07-31 DIAGNOSIS — L821 Other seborrheic keratosis: Secondary | ICD-10-CM | POA: Diagnosis not present

## 2020-07-31 DIAGNOSIS — L723 Sebaceous cyst: Secondary | ICD-10-CM | POA: Diagnosis not present

## 2020-07-31 DIAGNOSIS — D485 Neoplasm of uncertain behavior of skin: Secondary | ICD-10-CM | POA: Diagnosis not present

## 2020-07-31 DIAGNOSIS — L089 Local infection of the skin and subcutaneous tissue, unspecified: Secondary | ICD-10-CM | POA: Diagnosis not present

## 2020-08-06 ENCOUNTER — Encounter: Payer: Self-pay | Admitting: Rheumatology

## 2020-08-06 ENCOUNTER — Ambulatory Visit (INDEPENDENT_AMBULATORY_CARE_PROVIDER_SITE_OTHER): Payer: Medicare Other | Admitting: Rheumatology

## 2020-08-06 ENCOUNTER — Other Ambulatory Visit: Payer: Self-pay

## 2020-08-06 ENCOUNTER — Ambulatory Visit: Payer: Medicare Other | Admitting: Rheumatology

## 2020-08-06 VITALS — BP 131/74 | HR 80 | Resp 13 | Ht 60.5 in | Wt 119.0 lb

## 2020-08-06 DIAGNOSIS — M5030 Other cervical disc degeneration, unspecified cervical region: Secondary | ICD-10-CM | POA: Diagnosis not present

## 2020-08-06 DIAGNOSIS — I341 Nonrheumatic mitral (valve) prolapse: Secondary | ICD-10-CM

## 2020-08-06 DIAGNOSIS — E042 Nontoxic multinodular goiter: Secondary | ICD-10-CM

## 2020-08-06 DIAGNOSIS — G8929 Other chronic pain: Secondary | ICD-10-CM

## 2020-08-06 DIAGNOSIS — M5136 Other intervertebral disc degeneration, lumbar region: Secondary | ICD-10-CM | POA: Diagnosis not present

## 2020-08-06 DIAGNOSIS — Z8719 Personal history of other diseases of the digestive system: Secondary | ICD-10-CM

## 2020-08-06 DIAGNOSIS — M19041 Primary osteoarthritis, right hand: Secondary | ICD-10-CM

## 2020-08-06 DIAGNOSIS — M19071 Primary osteoarthritis, right ankle and foot: Secondary | ICD-10-CM

## 2020-08-06 DIAGNOSIS — M532X1 Spinal instabilities, occipito-atlanto-axial region: Secondary | ICD-10-CM | POA: Diagnosis not present

## 2020-08-06 DIAGNOSIS — Z8739 Personal history of other diseases of the musculoskeletal system and connective tissue: Secondary | ICD-10-CM | POA: Diagnosis not present

## 2020-08-06 DIAGNOSIS — I358 Other nonrheumatic aortic valve disorders: Secondary | ICD-10-CM | POA: Diagnosis not present

## 2020-08-06 DIAGNOSIS — E034 Atrophy of thyroid (acquired): Secondary | ICD-10-CM

## 2020-08-06 DIAGNOSIS — M19072 Primary osteoarthritis, left ankle and foot: Secondary | ICD-10-CM

## 2020-08-06 DIAGNOSIS — Z8639 Personal history of other endocrine, nutritional and metabolic disease: Secondary | ICD-10-CM

## 2020-08-06 DIAGNOSIS — M51369 Other intervertebral disc degeneration, lumbar region without mention of lumbar back pain or lower extremity pain: Secondary | ICD-10-CM

## 2020-08-06 DIAGNOSIS — I10 Essential (primary) hypertension: Secondary | ICD-10-CM | POA: Diagnosis not present

## 2020-08-06 DIAGNOSIS — M19042 Primary osteoarthritis, left hand: Secondary | ICD-10-CM

## 2020-08-06 DIAGNOSIS — M533 Sacrococcygeal disorders, not elsewhere classified: Secondary | ICD-10-CM | POA: Diagnosis not present

## 2020-08-06 DIAGNOSIS — K8020 Calculus of gallbladder without cholecystitis without obstruction: Secondary | ICD-10-CM

## 2020-08-06 DIAGNOSIS — Z860100 Personal history of colon polyps, unspecified: Secondary | ICD-10-CM

## 2020-08-06 DIAGNOSIS — Z7189 Other specified counseling: Secondary | ICD-10-CM

## 2020-08-06 DIAGNOSIS — M810 Age-related osteoporosis without current pathological fracture: Secondary | ICD-10-CM

## 2020-08-06 NOTE — Progress Notes (Signed)
Office Visit Note  Patient: [name]             Date of Birth: [birth year]           MRN: [id number]             PCP: Cassandria Anger, MD Referring: Cassandria Anger, MD Visit Date: 08/06/2020 Occupation: @GUAROCC @  Subjective:  Neck pain , joint pain.[name]   History of Present Illness: [name] is a 84 y.o. female with seronegative inflammatory arthritis.  She continues to have pain and discomfort in her cervical spine.  She also had discomfort in her hands and feet.  After her last visit she was referred to Dr. Vertell Limber.  Dr. Vertell Limber did a thorough evaluation and some further x-rays were obtained.  He found none millimeter of subluxation of C1 on C2 on flexion.  He advised not to flex her cervical spine and also gave her a collar.  He discussed possible surgery and a follow-up in a month.  She continues to have some lower back pain.  Activities of Daily Living:  Patient reports morning stiffness for 0 minutes.   Patient Reports nocturnal pain.  Difficulty dressing/grooming: Denies Difficulty climbing stairs: Denies Difficulty getting out of chair: Denies Difficulty using hands for taps, buttons, cutlery, and/or writing: Denies  Review of Systems  Constitutional: Positive for fatigue. Negative for night sweats, weight gain and weight loss.  HENT: Negative for mouth sores, trouble swallowing, trouble swallowing, mouth dryness and nose dryness.   Eyes: Negative for pain, redness, itching, visual disturbance and dryness.  Respiratory: Negative for cough, shortness of breath and difficulty breathing.   Cardiovascular: Negative for chest pain, palpitations, hypertension, irregular heartbeat and swelling in legs/feet.  Gastrointestinal: Negative for blood in stool, constipation and diarrhea.  Endocrine: Negative for increased urination.  Genitourinary: Negative for difficulty urinating and vaginal dryness.  Musculoskeletal: Positive for arthralgias and joint pain. Negative for  joint swelling, myalgias, muscle weakness, morning stiffness, muscle tenderness and myalgias.  Skin: Negative for color change, rash, hair loss, redness, skin tightness, ulcers and sensitivity to sunlight.  Allergic/Immunologic: Negative for susceptible to infections.  Neurological: Positive for headaches. Negative for dizziness, numbness, memory loss, night sweats and weakness.  Hematological: Positive for bruising/bleeding tendency. Negative for swollen glands.  Psychiatric/Behavioral: Negative for depressed mood, confusion and sleep disturbance. The patient is not nervous/anxious.     PMFS History:  Patient Active Problem List   Diagnosis Date Noted  . Arthralgia 03/18/2020  . RUQ pain 01/02/2020  . Cholelithiasis 09/09/2019  . Ear pain, bilateral 03/21/2019  . Neck pain 12/26/2018  . Acute pain of left shoulder 12/26/2018  . Anxiety 08/22/2018  . Contact dermatitis and eczema 06/14/2018  . Other constipation 11/11/2016  . Low back pain 11/10/2016  . Leg abrasion 08/12/2016  . Contusion of right knee 08/12/2016  . Sinusitis, chronic 08/06/2016  . Rash and nonspecific skin eruption 04/20/2016  . Aortic valve sclerosis 03/12/2015  . Contusion of left chest wall 01/09/2015  . Fatigue 09/08/2014  . Postherpetic neuralgia 10/02/2013  . Impacted cerumen of right ear 07/16/2013  . Headache 07/10/2013  . Aortic stenosis 04/19/2013  . Insomnia 01/24/2013  . Cough 08/07/2012  . URI (upper respiratory infection) 08/03/2012  . Pruritus of skin 03/11/2012  . MVP (mitral valve prolapse)   . Multiple thyroid nodules 10/04/2011  . Herpes zoster 09/07/2011  . Essential hypertension, benign 07/27/2011  . Atrophic vaginitis   . Well adult exam 03/15/2011  .  Hyperlipemia, mixed 03/15/2011  . OVERACTIVE BLADDER 09/02/2010  . Hypothyroidism 03/03/2010  . CALF PAIN, LEFT 10/27/2009  . Belching 07/21/2009  . EAR PAIN 12/19/2008  . Allergic rhinitis 12/19/2008  . Irritable bowel syndrome  09/04/2008  . PERSONAL HX COLONIC POLYPS 09/04/2008  . GASTRITIS, CHRONIC 09/03/2008  . DUODENITIS, WITHOUT HEMORRHAGE 09/03/2008  . TIBIALIS TENDINITIS 03/20/2008  . OSTEOARTHRITIS 02/25/2008  . SWEATING 02/21/2008  . LACTOSE INTOLERANCE 11/20/2007  . Dyslipidemia 11/20/2007  . GERD 11/20/2007  . Osteoporosis 11/20/2007    Past Medical History:  Diagnosis Date  . Allergic rhinitis   . Atrophic vaginitis   . Baker's cyst    Left-Dr. Aluisio  . Cataract    Dr. Katy Fitch  . Colon polyps   . Fibroid   . Gastritis    chronic  . GERD (gastroesophageal reflux disease)   . Heart murmur   . Hemorrhoids   . Hyperlipidemia   . IBS (irritable bowel syndrome)   . MVP (mitral valve prolapse)    Antibiotics required for dental procedures  . OA (osteoarthritis)   . Osteoporosis 06/2018   T score -2.2 stable on Prolia  . Overactive bladder   . Thyroid disease   . Thyroid nodule   . Torn meniscus    bilateral    Family History  Problem Relation Age of Onset  . Heart disease Mother   . Hypertension Mother   . Osteoporosis Mother   . Congestive Heart Failure Mother   . Pancreatic cancer Father   . Heart attack Brother   . Drug abuse Brother        over dose   . Healthy Son   . Healthy Son   . Colon cancer Neg Hx   . Colon polyps Neg Hx   . Rectal cancer Neg Hx   . Stomach cancer Neg Hx    Past Surgical History:  Procedure Laterality Date  . CATARACT EXTRACTION, BILATERAL    . TONSILLECTOMY     Social History   Social History Narrative   Regular exercise-yes   Daily caffeine use   Immunization History  Administered Date(s) Administered  . Fluad Quad(high Dose 65+) 08/04/2019  . Influenza Whole 09/29/2007, 08/21/2008, 09/02/2010, 07/30/2012  . Influenza, High Dose Seasonal PF 09/03/2013, 08/06/2016, 08/20/2017, 09/20/2018  . Influenza,inj,Quad PF,6+ Mos 12/17/2014, 07/30/2015  . PFIZER SARS-COV-2 Vaccination 12/12/2019, 12/31/2019, 07/31/2020  . Pneumococcal  Conjugate-13 01/09/2014  . Pneumococcal Polysaccharide-23 09/26/2006, 08/12/2015  . Td 04/14/2010  . Tdap 06/18/2020  . Typhoid Inactivated 02/23/2013  . Zoster 12/09/2006     Objective: Vital Signs: BP 131/74 (BP Location: Left Arm, Patient Position: Sitting, Cuff Size: Normal)   Pulse 80   Resp 13   Ht 5' 0.5" (1.537 m)   Wt 119 lb (54 kg)   BMI 22.86 kg/m    Physical Exam Vitals and nursing note reviewed.  Constitutional:      Appearance: She is well-developed.  HENT:     Head: Normocephalic and atraumatic.  Eyes:     Conjunctiva/sclera: Conjunctivae normal.  Cardiovascular:     Rate and Rhythm: Normal rate and regular rhythm.     Heart sounds: Normal heart sounds.  Pulmonary:     Effort: Pulmonary effort is normal.     Breath sounds: Normal breath sounds.  Abdominal:     General: Bowel sounds are normal.     Palpations: Abdomen is soft.  Musculoskeletal:     Cervical back: Normal range of motion.  Lymphadenopathy:  Cervical: No cervical adenopathy.  Skin:    General: Skin is warm and dry.     Capillary Refill: Capillary refill takes less than 2 seconds.  Neurological:     Mental Status: She is alert and oriented to person, place, and time.  Psychiatric:        Behavior: Behavior normal.      Musculoskeletal Exam: She has very limited painful range of motion of her cervical spine.  She had discomfort range of motion of her lumbar spine.  Shoulder joints, elbow joints, wrist joints, MCPs PIPs and DIPs in good range of motion.  She has bilateral second and third MCP thickening with no synovitis.  She has PIP and DIP thickening as well.  Hip joints and knee joints were in good range of motion with no synovitis.  She has bilateral PIP and DIP thickening of her feet.  She has bilateral hammertoes.  CDAI Exam: CDAI Score: -- Patient Global: --; Provider Global: -- Swollen: --; Tender: -- Joint Exam 08/06/2020   No joint exam has been documented for this visit    There is currently no information documented on the homunculus. Go to the Rheumatology activity and complete the homunculus joint exam.  Investigation: No additional findings.  Imaging: No results found.  Recent Labs: Lab Results  Component Value Date   WBC 5.6 03/14/2020   HGB 13.6 03/14/2020   PLT 276.0 03/14/2020   NA 133 (L) 07/17/2020   K 4.6 07/17/2020   CL 98 07/17/2020   CO2 24 07/17/2020   GLUCOSE 85 07/17/2020   BUN 19 07/17/2020   CREATININE 0.74 07/17/2020   BILITOT 0.6 07/17/2020   ALKPHOS 63 12/26/2019   AST 25 07/17/2020   ALT 33 (H) 07/17/2020   PROT 7.3 07/17/2020   ALBUMIN 3.9 12/26/2019   CALCIUM 9.3 07/17/2020   GFRAA 87 07/17/2020  July 02, 2020 UA negative, sed rate 14, uric acid 4.9, RF negative, anti-CCP negative, 14 3 3  eta negative  Speciality Comments: No specialty comments available.  Procedures:  No procedures performed Allergies: Bee venom, Doxycycline, and Neosporin [neomycin-bacitracin zn-polymyx]   Assessment / Plan:     Visit Diagnoses: History of seronegative inflammatory arthritis - Negative RF, negative anti-CCP, -14 3 3  eta.  X-ray showed juxta-articular osteopenia, joint space narrowing and cystic versus erosive changes.  The differential diagnosis could be rheumatoid arthritis versus crystal induced arthropathy.  Although no calcification was noted.  She has no synovitis in her hands.  MRI of the cervical spine showed fluid collection at the cranial cervical junction on July 07, 2020.  We had detailed discussion regarding uncertainty of the diagnosis with negative serology.  We discussed second opinion from [place].  She was in agreement.  I will make a referral to [facility] rheumatology.  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 Vertell Limber.  She has severe neck pain.  I would like to give her a steroid taper but she recently had Covid vaccination.  I have advised her to wait for 2 weeks and then  she can do a prednisone taper starting at 20 mg and taper by 5 mg every 4 days.  DDD (degenerative disc disease), cervical - MRI consistent with degenerative disc disease, facet joint arthropathy, spinal stenosis and fluid collection at craniocervical junction.  Evaluated by Dr. Vertell Limber  DDD (degenerative disc disease), lumbar - She was referred to physical therapy at [facility].  She continues to have some lower back discomfort.  Chronic SI joint pain - X-rays were unremarkable.  Primary osteoarthritis of both hands  Primary osteoarthritis of both feet  Age-related osteoporosis without current pathological fracture - Treated with Prolia, calcium and vitamin D.  Essential hypertension, benign  History of hyperlipidemia  MVP (mitral valve prolapse)  Aortic valve sclerosis  History of gastroesophageal reflux (GERD)  History of IBS  Hx of colonic polyps  Calculus of gallbladder without cholecystitis without obstruction  Multiple thyroid nodules  Hypothyroidism due to acquired atrophy of thyroid  She is fully vaccinated against COVID-19 and also received a booster.  Use of mask, social distancing and hand hygiene was discussed.  I also advised in case she develops COVID-19 infection she might be candidate for monoclonal antibody infusion.  Orders: Orders Placed This Encounter  Procedures  . Ambulatory referral to Rheumatology   No orders of the defined types were placed in this encounter.    Follow-Up Instructions: Return in about 2 months (around 10/06/2020) for Seronegative inflammatory arthritis.   Bo Merino, MD  Note - This record has been created using Editor, commissioning.  Chart creation errors have been sought, but may not always  have been located. Such creation errors do not reflect on  the standard of medical care.

## 2020-08-07 ENCOUNTER — Ambulatory Visit: Payer: Medicare Other | Admitting: Rheumatology

## 2020-08-13 ENCOUNTER — Other Ambulatory Visit: Payer: Self-pay | Admitting: Rheumatology

## 2020-08-13 MED ORDER — PREDNISONE 5 MG PO TABS: ORAL_TABLET | ORAL | 0 refills | Status: DC

## 2020-08-13 NOTE — Telephone Encounter (Signed)
Per office note on 08/06/2020 I have advised her to wait for 2 weeks and then she can do a prednisone taper starting at 20 mg and taper by 5 mg every 4 days.

## 2020-08-13 NOTE — Telephone Encounter (Signed)
Patient called stating at her last appointment she was told a prescription of Prednisone would be sent to [facility].  Patient states she called the pharmacy and they have not received the prescription.   Patient states Dr. Estanislado Pandy wanted her to wait 2 weeks after receiving the booster vaccine before starting the medication which would be 08/20/20.  Patient is requesting a return call.

## 2020-08-15 ENCOUNTER — Telehealth: Payer: Self-pay | Admitting: Rheumatology

## 2020-08-15 NOTE — Telephone Encounter (Signed)
Patient called stating Dr. Estanislado Pandy referred her to Dr. Linward Headland at [facility] for a second opinion.  Patient states she was told that Dr. Linward Headland is leaving in one month and has no appointments available.  Patient states she was put on a cancellation list for the first available doctor that becomes available.  Patient states "I don't think Dr. Estanislado Pandy is sending me to [facility] to just see anybody" and requesting a return call.  Patient states if there is another doctor that Dr. Estanislado Pandy recommends at [facility] she would be happy to see them or if there is someone at [facility] that she recommends she would go there too.

## 2020-08-15 NOTE — Telephone Encounter (Signed)
I called [facility] and patient is now scheduled with Dr. Eloise Levels on 08/21/2020 at 10am.  Location: [address] [place].   I attempted to contact patient and left message on machine to advise patient of appointment details.

## 2020-08-18 ENCOUNTER — Encounter: Payer: Self-pay | Admitting: *Deleted

## 2020-08-18 ENCOUNTER — Telehealth: Payer: Self-pay | Admitting: Rheumatology

## 2020-08-18 NOTE — Telephone Encounter (Signed)
Attempted to contact the patient and left message for patient to call the office.   According to patient's chart the referral is for:   Audie Pinto, MD  Rheumatology  [place] [address]    Phone: [phone]  Message sent to patient in my chart.

## 2020-08-18 NOTE — Telephone Encounter (Signed)
Patient would like for you to call her with the name, address, and phone number of the doctor she is being referred to. She has missed this information. (It is not Dr. Dennison Mascot )

## 2020-08-20 DIAGNOSIS — M4312 Spondylolisthesis, cervical region: Secondary | ICD-10-CM | POA: Diagnosis not present

## 2020-08-20 DIAGNOSIS — M47812 Spondylosis without myelopathy or radiculopathy, cervical region: Secondary | ICD-10-CM | POA: Diagnosis not present

## 2020-08-20 DIAGNOSIS — M5030 Other cervical disc degeneration, unspecified cervical region: Secondary | ICD-10-CM | POA: Diagnosis not present

## 2020-08-20 DIAGNOSIS — M069 Rheumatoid arthritis, unspecified: Secondary | ICD-10-CM | POA: Insufficient documentation

## 2020-08-20 DIAGNOSIS — R030 Elevated blood-pressure reading, without diagnosis of hypertension: Secondary | ICD-10-CM | POA: Diagnosis not present

## 2020-08-20 DIAGNOSIS — M542 Cervicalgia: Secondary | ICD-10-CM | POA: Diagnosis not present

## 2020-08-21 DIAGNOSIS — M8580 Other specified disorders of bone density and structure, unspecified site: Secondary | ICD-10-CM | POA: Diagnosis not present

## 2020-08-21 DIAGNOSIS — M19042 Primary osteoarthritis, left hand: Secondary | ICD-10-CM | POA: Diagnosis not present

## 2020-08-21 DIAGNOSIS — M19072 Primary osteoarthritis, left ankle and foot: Secondary | ICD-10-CM | POA: Diagnosis not present

## 2020-08-21 DIAGNOSIS — M19041 Primary osteoarthritis, right hand: Secondary | ICD-10-CM | POA: Diagnosis not present

## 2020-08-21 DIAGNOSIS — M7732 Calcaneal spur, left foot: Secondary | ICD-10-CM | POA: Diagnosis not present

## 2020-08-21 DIAGNOSIS — M1811 Unilateral primary osteoarthritis of first carpometacarpal joint, right hand: Secondary | ICD-10-CM | POA: Diagnosis not present

## 2020-08-21 DIAGNOSIS — M2012 Hallux valgus (acquired), left foot: Secondary | ICD-10-CM | POA: Diagnosis not present

## 2020-08-21 DIAGNOSIS — M2550 Pain in unspecified joint: Secondary | ICD-10-CM | POA: Diagnosis not present

## 2020-08-21 DIAGNOSIS — M7731 Calcaneal spur, right foot: Secondary | ICD-10-CM | POA: Diagnosis not present

## 2020-08-21 DIAGNOSIS — M1812 Unilateral primary osteoarthritis of first carpometacarpal joint, left hand: Secondary | ICD-10-CM | POA: Diagnosis not present

## 2020-08-21 DIAGNOSIS — M542 Cervicalgia: Secondary | ICD-10-CM | POA: Diagnosis not present

## 2020-08-21 DIAGNOSIS — M19071 Primary osteoarthritis, right ankle and foot: Secondary | ICD-10-CM | POA: Diagnosis not present

## 2020-08-22 ENCOUNTER — Telehealth: Payer: Self-pay | Admitting: Rheumatology

## 2020-08-22 NOTE — Telephone Encounter (Signed)
I received a phone call from Dr. Sherrian Divers.  He stated he did tolerate evaluation and even did ultrasound of her hands and could not find any peripheral arthritis.  He believes that the cervical pain is coming from synovial cyst and she may or may not need surgery at this point.  We discussed possibility of CPPD which was hard to determine.  He believes the symptoms are coming from underlying DDD only.

## 2020-08-22 NOTE — Telephone Encounter (Signed)
Dr. Hoyle Sauer, Rheumatologist at [facility] called requesting to speak with Dr. Estanislado Pandy regarding [name] who is a mutual patient.  Phone [phone]

## 2020-08-22 NOTE — Telephone Encounter (Signed)
I left a message on the answering machine for patient to call back.

## 2020-08-25 ENCOUNTER — Telehealth: Payer: Self-pay

## 2020-08-25 NOTE — Telephone Encounter (Signed)
Patient called stating she was returning Dr. Deveshwar's call.   

## 2020-08-27 ENCOUNTER — Telehealth: Payer: Self-pay | Admitting: *Deleted

## 2020-08-27 NOTE — Telephone Encounter (Signed)
Please schedule a follow-up appointment on any day at 8:00 in the morning.  Please advise patient to come come in 10 to 15 minutes prior to the appointment.

## 2020-08-27 NOTE — Telephone Encounter (Signed)
Patient called, she is wondering if she should schedule a follow-up appointment to review the results after her second opinion with Dr. Sherrian Divers at [facility] or if she can review these results via telephone. Patient is scheduled for an appointment on 10/08/2020, but would like to come in sooner if possible to review the second opinion.   Patient can be reached at [phone].

## 2020-08-27 NOTE — Telephone Encounter (Signed)
Patient advised Dr. Estanislado Pandy would like to schedule a sooner appointment to discuss results from Dr. Sherrian Divers. Patient scheduled for 09/02/2020 at 8:00 am. Patient advised to arrive 10-15 minutes early.

## 2020-08-30 NOTE — Progress Notes (Signed)
Office Visit Note  Patient: [name]             Date of Birth: [birth year]           MRN: [id number]             PCP: Cassandria Anger, MD Referring: Cassandria Anger, MD Visit Date: 09/02/2020 Occupation: @GUAROCC @  Subjective:  Neck pain.   History of Present Illness: [name] is a 84 y.o. female with history of degenerative disc disease of cervical and lumbar spine.  She went to [facility] for second opinion.  She was evaluated by Dr. Sherrian Divers over there.  I had detailed conversation with Dr. Sherrian Divers after her visit.  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  He also did ultrasound of her hands and did not find any component of inflammatory arthritis.  He did not think that she has chondrocalcinosis or any inflammatory arthritis.  She continues to have discomfort in her neck and lower back.  She states the pain in her cervical spine has improved.  She is not experiencing as much nocturnal pain although the pain persists.  She will be having a follow-up appointment with Dr. Sherrian Divers in 3 months.  Activities of Daily Living:  Patient reports morning stiffness for a few minutes.   Patient Reports nocturnal pain.  Difficulty dressing/grooming: Denies Difficulty climbing stairs: Denies Difficulty getting out of chair: Denies Difficulty using hands for taps, buttons, cutlery, and/or writing: Denies  Review of Systems  Constitutional: Positive for fatigue.  HENT: Positive for mouth dryness. Negative for mouth sores and nose dryness.   Eyes: Negative for pain, itching and dryness.  Respiratory: Negative for shortness of breath and difficulty breathing.   Cardiovascular: Negative for chest pain and palpitations.  Gastrointestinal: Negative for blood in stool, constipation and diarrhea.  Endocrine: Negative for increased urination.  Genitourinary: Negative for difficulty urinating and painful urination.  Musculoskeletal:  Positive for arthralgias, joint pain, myalgias, morning stiffness and myalgias. Negative for joint swelling and muscle tenderness.  Skin: Negative for color change, rash and redness.  Allergic/Immunologic: Negative for susceptible to infections.  Neurological: Negative for dizziness, numbness, headaches, memory loss and weakness.  Hematological: Positive for bruising/bleeding tendency.  Psychiatric/Behavioral: Negative for confusion and sleep disturbance.    PMFS History:  Patient Active Problem List   Diagnosis Date Noted  . Arthralgia 03/18/2020  . RUQ pain 01/02/2020  . Cholelithiasis 09/09/2019  . Ear pain, bilateral 03/21/2019  . Neck pain 12/26/2018  . Acute pain of left shoulder 12/26/2018  . Anxiety 08/22/2018  . Contact dermatitis and eczema 06/14/2018  . Other constipation 11/11/2016  . Low back pain 11/10/2016  . Leg abrasion 08/12/2016  . Contusion of right knee 08/12/2016  . Sinusitis, chronic 08/06/2016  . Rash and nonspecific skin eruption 04/20/2016  . Aortic valve sclerosis 03/12/2015  . Contusion of left chest wall 01/09/2015  . Fatigue 09/08/2014  . Postherpetic neuralgia 10/02/2013  . Impacted cerumen of right ear 07/16/2013  . Headache 07/10/2013  . Aortic stenosis 04/19/2013  . Insomnia 01/24/2013  . Cough 08/07/2012  . URI (upper respiratory infection) 08/03/2012  . Pruritus of skin 03/11/2012  . MVP (mitral valve prolapse)   . Multiple thyroid nodules 10/04/2011  . Herpes zoster 09/07/2011  . Essential hypertension, benign 07/27/2011  . Atrophic vaginitis   . Well adult exam 03/15/2011  . Hyperlipemia, mixed 03/15/2011  . OVERACTIVE BLADDER  09/02/2010  . Hypothyroidism 03/03/2010  . CALF PAIN, LEFT 10/27/2009  . Belching 07/21/2009  . EAR PAIN 12/19/2008  . Allergic rhinitis 12/19/2008  . Irritable bowel syndrome 09/04/2008  . PERSONAL HX COLONIC POLYPS 09/04/2008  . GASTRITIS, CHRONIC 09/03/2008  . DUODENITIS, WITHOUT HEMORRHAGE 09/03/2008    . TIBIALIS TENDINITIS 03/20/2008  . OSTEOARTHRITIS 02/25/2008  . SWEATING 02/21/2008  . LACTOSE INTOLERANCE 11/20/2007  . Dyslipidemia 11/20/2007  . GERD 11/20/2007  . Osteoporosis 11/20/2007    Past Medical History:  Diagnosis Date  . Allergic rhinitis   . Atrophic vaginitis   . Baker's cyst    Left-Dr. Aluisio  . Cataract    Dr. Katy Fitch  . Colon polyps   . Fibroid   . Gastritis    chronic  . GERD (gastroesophageal reflux disease)   . Heart murmur   . Hemorrhoids   . Hyperlipidemia   . IBS (irritable bowel syndrome)   . MVP (mitral valve prolapse)    Antibiotics required for dental procedures  . OA (osteoarthritis)   . Osteoporosis 06/2018   T score -2.2 stable on Prolia  . Overactive bladder   . Thyroid disease   . Thyroid nodule   . Torn meniscus    bilateral    Family History  Problem Relation Age of Onset  . Heart disease Mother   . Hypertension Mother   . Osteoporosis Mother   . Congestive Heart Failure Mother   . Pancreatic cancer Father   . Heart attack Brother   . Drug abuse Brother        over dose   . Healthy Son   . Healthy Son   . Colon cancer Neg Hx   . Colon polyps Neg Hx   . Rectal cancer Neg Hx   . Stomach cancer Neg Hx    Past Surgical History:  Procedure Laterality Date  . CATARACT EXTRACTION, BILATERAL    . TONSILLECTOMY     Social History   Social History Narrative   Regular exercise-yes   Daily caffeine use   Immunization History  Administered Date(s) Administered  . Fluad Quad(high Dose 65+) 08/04/2019  . Influenza Whole 09/29/2007, 08/21/2008, 09/02/2010, 07/30/2012  . Influenza, High Dose Seasonal PF 09/03/2013, 08/06/2016, 08/20/2017, 09/20/2018  . Influenza,inj,Quad PF,6+ Mos 12/17/2014, 07/30/2015  . PFIZER SARS-COV-2 Vaccination 12/12/2019, 12/31/2019, 07/31/2020  . Pneumococcal Conjugate-13 01/09/2014  . Pneumococcal Polysaccharide-23 09/26/2006, 08/12/2015  . Td 04/14/2010  . Tdap 06/18/2020  . Typhoid  Inactivated 02/23/2013  . Zoster 12/09/2006     Objective: Vital Signs: BP 133/79 (BP Location: Right Arm, Patient Position: Sitting, Cuff Size: Normal)   Pulse 73   Resp 13   Ht 5' 0.5" (1.537 m)   Wt 119 lb 12.8 oz (54.3 kg)   BMI 23.01 kg/m    Physical Exam Vitals and nursing note reviewed.  Constitutional:      Appearance: She is well-developed.  HENT:     Head: Normocephalic and atraumatic.  Eyes:     Conjunctiva/sclera: Conjunctivae normal.  Cardiovascular:     Rate and Rhythm: Normal rate and regular rhythm.     Heart sounds: Normal heart sounds.  Pulmonary:     Effort: Pulmonary effort is normal.     Breath sounds: Normal breath sounds.  Abdominal:     General: Bowel sounds are normal.     Palpations: Abdomen is soft.  Musculoskeletal:     Cervical back: Normal range of motion.  Lymphadenopathy:     Cervical: No  cervical adenopathy.  Skin:    General: Skin is warm and dry.     Capillary Refill: Capillary refill takes less than 2 seconds.  Neurological:     Mental Status: She is alert and oriented to person, place, and time.  Psychiatric:        Behavior: Behavior normal.      Musculoskeletal Exam: Range of motion of her cervical spine with discomfort.  She has good range of motion for lumbar spine with discomfort.  Shoulder joints, elbow joints, wrist joints with good range of motion.  She has synovial thickening of bilateral second MCP joint with no synovitis.  PIP and DIP thickening was noted.  Hip joints and knee joints in good range of motion.  She has bilateral DIP and PIP thickening and hammertoes.  CDAI Exam: CDAI Score: -- Patient Global: --; Provider Global: -- Swollen: --; Tender: -- Joint Exam 09/02/2020   No joint exam has been documented for this visit   There is currently no information documented on the homunculus. Go to the Rheumatology activity and complete the homunculus joint exam.  Investigation: No additional  findings.  Imaging: No results found.  Recent Labs: Lab Results  Component Value Date   WBC 5.6 03/14/2020   HGB 13.6 03/14/2020   PLT 276.0 03/14/2020   NA 133 (L) 07/17/2020   K 4.6 07/17/2020   CL 98 07/17/2020   CO2 24 07/17/2020   GLUCOSE 85 07/17/2020   BUN 19 07/17/2020   CREATININE 0.74 07/17/2020   BILITOT 0.6 07/17/2020   ALKPHOS 63 12/26/2019   AST 25 07/17/2020   ALT 33 (H) 07/17/2020   PROT 7.3 07/17/2020   ALBUMIN 3.9 12/26/2019   CALCIUM 9.3 07/17/2020   GFRAA 87 07/17/2020    Speciality Comments: No specialty comments available.  Procedures:  No procedures performed Allergies: Bee venom, Doxycycline, and Neosporin [neomycin-bacitracin zn-polymyx]   Assessment / Plan:     Visit Diagnoses: DDD (degenerative disc disease), cervical - Evaluated by Dr.Chu at [facility].  He felt symptoms are related to synovial cyst and DDD.  I had detailed discussion with the patient regarding the MRI findings and also her evaluation [facility].  Have advised not to take the prednisone.  She will follow up with Dr. Vertell Limber.  Her cervical discomfort is getting better.  C1-C2 instability - MRI consistent with degenerative disc disease, facet joint arthropathy, spinal stenosis and fluid collection at craniocervical junction.  Evaluated by Dr. Vertell Limber  DDD (degenerative disc disease), lumbar - She was referred to physical therapy at [facility].  Patient decided not to do the physical therapy.  Per her request have given her a handout on back exercises.  Chronic SI joint pain-most like related to her lower back pain.  Primary osteoarthritis of both hands - Dr. Sherrian Divers performed ultrasound of bilateral hands which was negative for synovitis.  All autoimmune work-up was negative.  She has some thickening of bilateral second MCP joint.  Raises possible concern of chondrocalcinosis but no crystals were seen.  Joint protection was discussed.  A handout on hand exercises was given.  Primary osteoarthritis  of both feet-she has severe osteoarthritis with overcrowding of her toes.  Age-related osteoporosis without current pathological fracture - She is on Prolia, calcium and vitamin D.  She will be getting repeat DEXA scan.  Other medical problems are listed as follows:  Essential hypertension, benign  History of hyperlipidemia  MVP (mitral valve prolapse)  Aortic valve sclerosis  History of gastroesophageal reflux (  GERD)  History of IBS  Hx of colonic polyps  Calculus of gallbladder without cholecystitis without obstruction  Multiple thyroid nodules  Hypothyroidism due to acquired atrophy of thyroid  Educated about COVID-19 virus infection-she is fully vaccinated against COVID-19.  Use of mask, social distancing and hand hygiene was discussed.  Orders: No orders of the defined types were placed in this encounter.  No orders of the defined types were placed in this encounter.    Follow-Up Instructions: Return for DDD.   Bo Merino, MD  Note - This record has been created using Editor, commissioning.  Chart creation errors have been sought, but may not always  have been located. Such creation errors do not reflect on  the standard of medical care.

## 2020-09-02 ENCOUNTER — Other Ambulatory Visit: Payer: Self-pay

## 2020-09-02 ENCOUNTER — Encounter: Payer: Self-pay | Admitting: Rheumatology

## 2020-09-02 ENCOUNTER — Ambulatory Visit (INDEPENDENT_AMBULATORY_CARE_PROVIDER_SITE_OTHER): Payer: Medicare Other | Admitting: Rheumatology

## 2020-09-02 VITALS — BP 133/79 | HR 73 | Resp 13 | Ht 60.5 in | Wt 119.8 lb

## 2020-09-02 DIAGNOSIS — I10 Essential (primary) hypertension: Secondary | ICD-10-CM

## 2020-09-02 DIAGNOSIS — I341 Nonrheumatic mitral (valve) prolapse: Secondary | ICD-10-CM

## 2020-09-02 DIAGNOSIS — M19072 Primary osteoarthritis, left ankle and foot: Secondary | ICD-10-CM

## 2020-09-02 DIAGNOSIS — I358 Other nonrheumatic aortic valve disorders: Secondary | ICD-10-CM

## 2020-09-02 DIAGNOSIS — M19042 Primary osteoarthritis, left hand: Secondary | ICD-10-CM

## 2020-09-02 DIAGNOSIS — M532X1 Spinal instabilities, occipito-atlanto-axial region: Secondary | ICD-10-CM

## 2020-09-02 DIAGNOSIS — M19041 Primary osteoarthritis, right hand: Secondary | ICD-10-CM

## 2020-09-02 DIAGNOSIS — E034 Atrophy of thyroid (acquired): Secondary | ICD-10-CM

## 2020-09-02 DIAGNOSIS — Z8719 Personal history of other diseases of the digestive system: Secondary | ICD-10-CM | POA: Diagnosis not present

## 2020-09-02 DIAGNOSIS — M533 Sacrococcygeal disorders, not elsewhere classified: Secondary | ICD-10-CM

## 2020-09-02 DIAGNOSIS — Z86010 Personal history of colonic polyps: Secondary | ICD-10-CM

## 2020-09-02 DIAGNOSIS — Z8639 Personal history of other endocrine, nutritional and metabolic disease: Secondary | ICD-10-CM

## 2020-09-02 DIAGNOSIS — K8020 Calculus of gallbladder without cholecystitis without obstruction: Secondary | ICD-10-CM

## 2020-09-02 DIAGNOSIS — M5136 Other intervertebral disc degeneration, lumbar region: Secondary | ICD-10-CM | POA: Diagnosis not present

## 2020-09-02 DIAGNOSIS — E042 Nontoxic multinodular goiter: Secondary | ICD-10-CM

## 2020-09-02 DIAGNOSIS — M19071 Primary osteoarthritis, right ankle and foot: Secondary | ICD-10-CM

## 2020-09-02 DIAGNOSIS — Z7189 Other specified counseling: Secondary | ICD-10-CM

## 2020-09-02 DIAGNOSIS — M5030 Other cervical disc degeneration, unspecified cervical region: Secondary | ICD-10-CM

## 2020-09-02 DIAGNOSIS — G8929 Other chronic pain: Secondary | ICD-10-CM

## 2020-09-02 DIAGNOSIS — M810 Age-related osteoporosis without current pathological fracture: Secondary | ICD-10-CM

## 2020-09-02 NOTE — Patient Instructions (Signed)
Hand Exercises Hand exercises can be helpful for almost anyone. These exercises can strengthen the hands, improve flexibility and movement, and increase blood flow to the hands. These results can make work and daily tasks easier. Hand exercises can be especially helpful for people who have joint pain from arthritis or have nerve damage from overuse (carpal tunnel syndrome). These exercises can also help people who have injured a hand. Exercises Most of these hand exercises are gentle stretching and motion exercises. It is usually safe to do them often throughout the day. Warming up your hands before exercise may help to reduce stiffness. You can do this with gentle massage or by placing your hands in warm water for 10-15 minutes. It is normal to feel some stretching, pulling, tightness, or mild discomfort as you begin new exercises. This will gradually improve. Stop an exercise right away if you feel sudden, severe pain or your pain gets worse. Ask your health care provider which exercises are best for you. Knuckle bend or "claw" fist 1. Stand or sit with your arm, hand, and all five fingers pointed straight up. Make sure to keep your wrist straight during the exercise. 2. Gently bend your fingers down toward your palm until the tips of your fingers are touching the top of your palm. Keep your big knuckle straight and just bend the small knuckles in your fingers. 3. Hold this position for __________ seconds. 4. Straighten (extend) your fingers back to the starting position. Repeat this exercise 5-10 times with each hand. Full finger fist 1. Stand or sit with your arm, hand, and all five fingers pointed straight up. Make sure to keep your wrist straight during the exercise. 2. Gently bend your fingers into your palm until the tips of your fingers are touching the middle of your palm. 3. Hold this position for __________ seconds. 4. Extend your fingers back to the starting position, stretching every  joint fully. Repeat this exercise 5-10 times with each hand. Straight fist 1. Stand or sit with your arm, hand, and all five fingers pointed straight up. Make sure to keep your wrist straight during the exercise. 2. Gently bend your fingers at the big knuckle, where your fingers meet your hand, and the middle knuckle. Keep the knuckle at the tips of your fingers straight and try to touch the bottom of your palm. 3. Hold this position for __________ seconds. 4. Extend your fingers back to the starting position, stretching every joint fully. Repeat this exercise 5-10 times with each hand. Tabletop 1. Stand or sit with your arm, hand, and all five fingers pointed straight up. Make sure to keep your wrist straight during the exercise. 2. Gently bend your fingers at the big knuckle, where your fingers meet your hand, as far down as you can while keeping the small knuckles in your fingers straight. Think of forming a tabletop with your fingers. 3. Hold this position for __________ seconds. 4. Extend your fingers back to the starting position, stretching every joint fully. Repeat this exercise 5-10 times with each hand. Finger spread 1. Place your hand flat on a table with your palm facing down. Make sure your wrist stays straight as you do this exercise. 2. Spread your fingers and thumb apart from each other as far as you can until you feel a gentle stretch. Hold this position for __________ seconds. 3. Bring your fingers and thumb tight together again. Hold this position for __________ seconds. Repeat this exercise 5-10 times with each hand.   Making circles 1. Stand or sit with your arm, hand, and all five fingers pointed straight up. Make sure to keep your wrist straight during the exercise. 2. Make a circle by touching the tip of your thumb to the tip of your index finger. 3. Hold for __________ seconds. Then open your hand wide. 4. Repeat this motion with your thumb and each finger on your hand.  Repeat this exercise 5-10 times with each hand. Thumb motion 1. Sit with your forearm resting on a table and your wrist straight. Your thumb should be facing up toward the ceiling. Keep your fingers relaxed as you move your thumb. 2. Lift your thumb up as high as you can toward the ceiling. Hold for __________ seconds. 3. Bend your thumb across your palm as far as you can, reaching the tip of your thumb for the small finger (pinkie) side of your palm. Hold for __________ seconds. Repeat this exercise 5-10 times with each hand. Grip strengthening  1. Hold a stress ball or other soft ball in the middle of your hand. 2. Slowly increase the pressure, squeezing the ball as much as you can without causing pain. Think of bringing the tips of your fingers into the middle of your palm. All of your finger joints should bend when doing this exercise. 3. Hold your squeeze for __________ seconds, then relax. Repeat this exercise 5-10 times with each hand. Contact a health care provider if:  Your hand pain or discomfort gets much worse when you do an exercise.  Your hand pain or discomfort does not improve within 2 hours after you exercise. If you have any of these problems, stop doing these exercises right away. Do not do them again unless your health care provider says that you can. Get help right away if:  You develop sudden, severe hand pain or swelling. If this happens, stop doing these exercises right away. Do not do them again unless your health care provider says that you can. This information is not intended to replace advice given to you by your health care provider. Make sure you discuss any questions you have with your health care provider. Document Revised: 03/08/2019 Document Reviewed: 11/16/2018 [employer] Patient Education  2020 [employer] Back Exercises The following exercises strengthen the muscles that help to support the trunk and back. They also help to keep the lower back  flexible. Doing these exercises can help to prevent back pain or lessen existing pain.  If you have back pain or discomfort, try doing these exercises 2-3 times each day or as told by your health care provider.  As your pain improves, do them once each day, but increase the number of times that you repeat the steps for each exercise (do more repetitions).  To prevent the recurrence of back pain, continue to do these exercises once each day or as told by your health care provider. Do exercises exactly as told by your health care provider and adjust them as directed. It is normal to feel mild stretching, pulling, tightness, or discomfort as you do these exercises, but you should stop right away if you feel sudden pain or your pain gets worse. Exercises Single knee to chest Repeat these steps 3-5 times for each leg: 1. Lie on your back on a firm bed or the floor with your legs extended. 2. Bring one knee to your chest. Your other leg should stay extended and in contact with the floor. 3. Hold your knee in place by grabbing   your knee or thigh with both hands and hold. 4. Pull on your knee until you feel a gentle stretch in your lower back or buttocks. 5. Hold the stretch for 10-30 seconds. 6. Slowly release and straighten your leg. Pelvic tilt Repeat these steps 5-10 times: 1. Lie on your back on a firm bed or the floor with your legs extended. 2. Bend your knees so they are pointing toward the ceiling and your feet are flat on the floor. 3. Tighten your lower abdominal muscles to press your lower back against the floor. This motion will tilt your pelvis so your tailbone points up toward the ceiling instead of pointing to your feet or the floor. 4. With gentle tension and even breathing, hold this position for 5-10 seconds. Cat-cow Repeat these steps until your lower back becomes more flexible: 1. Get into a hands-and-knees position on a firm surface. Keep your hands under your shoulders, and  keep your knees under your hips. You may place padding under your knees for comfort. 2. Let your head hang down toward your chest. Contract your abdominal muscles and point your tailbone toward the floor so your lower back becomes rounded like the back of a cat. 3. Hold this position for 5 seconds. 4. Slowly lift your head, let your abdominal muscles relax and point your tailbone up toward the ceiling so your back forms a sagging arch like the back of a cow. 5. Hold this position for 5 seconds.  Press-ups Repeat these steps 5-10 times: 1. Lie on your abdomen (face-down) on the floor. 2. Place your palms near your head, about shoulder-width apart. 3. Keeping your back as relaxed as possible and keeping your hips on the floor, slowly straighten your arms to raise the top half of your body and lift your shoulders. Do not use your back muscles to raise your upper torso. You may adjust the placement of your hands to make yourself more comfortable. 4. Hold this position for 5 seconds while you keep your back relaxed. 5. Slowly return to lying flat on the floor.  Bridges Repeat these steps 10 times: 1. Lie on your back on a firm surface. 2. Bend your knees so they are pointing toward the ceiling and your feet are flat on the floor. Your arms should be flat at your sides, next to your body. 3. Tighten your buttocks muscles and lift your buttocks off the floor until your waist is at almost the same height as your knees. You should feel the muscles working in your buttocks and the back of your thighs. If you do not feel these muscles, slide your feet 1-2 inches farther away from your buttocks. 4. Hold this position for 3-5 seconds. 5. Slowly lower your hips to the starting position, and allow your buttocks muscles to relax completely. If this exercise is too easy, try doing it with your arms crossed over your chest. Abdominal crunches Repeat these steps 5-10 times: 1. Lie on your back on a firm bed or  the floor with your legs extended. 2. Bend your knees so they are pointing toward the ceiling and your feet are flat on the floor. 3. Cross your arms over your chest. 4. Tip your chin slightly toward your chest without bending your neck. 5. Tighten your abdominal muscles and slowly raise your trunk (torso) high enough to lift your shoulder blades a tiny bit off the floor. Avoid raising your torso higher than that because it can put too much stress on   your low back and does not help to strengthen your abdominal muscles. 6. Slowly return to your starting position. Back lifts Repeat these steps 5-10 times: 1. Lie on your abdomen (face-down) with your arms at your sides, and rest your forehead on the floor. 2. Tighten the muscles in your legs and your buttocks. 3. Slowly lift your chest off the floor while you keep your hips pressed to the floor. Keep the back of your head in line with the curve in your back. Your eyes should be looking at the floor. 4. Hold this position for 3-5 seconds. 5. Slowly return to your starting position. Contact a health care provider if:  Your back pain or discomfort gets much worse when you do an exercise.  Your worsening back pain or discomfort does not lessen within 2 hours after you exercise. If you have any of these problems, stop doing these exercises right away. Do not do them again unless your health care provider says that you can. Get help right away if:  You develop sudden, severe back pain. If this happens, stop doing the exercises right away. Do not do them again unless your health care provider says that you can. This information is not intended to replace advice given to you by your health care provider. Make sure you discuss any questions you have with your health care provider. Document Revised: 03/22/2019 Document Reviewed: 08/17/2018 [employer] Patient Education  2020 [employer]  

## 2020-09-08 ENCOUNTER — Other Ambulatory Visit: Payer: Self-pay | Admitting: Internal Medicine

## 2020-09-10 DIAGNOSIS — J3089 Other allergic rhinitis: Secondary | ICD-10-CM | POA: Diagnosis not present

## 2020-09-10 DIAGNOSIS — J301 Allergic rhinitis due to pollen: Secondary | ICD-10-CM | POA: Diagnosis not present

## 2020-09-10 DIAGNOSIS — T63441D Toxic effect of venom of bees, accidental (unintentional), subsequent encounter: Secondary | ICD-10-CM | POA: Diagnosis not present

## 2020-09-10 DIAGNOSIS — J4520 Mild intermittent asthma, uncomplicated: Secondary | ICD-10-CM | POA: Diagnosis not present

## 2020-09-17 ENCOUNTER — Other Ambulatory Visit (HOSPITAL_COMMUNITY): Payer: Self-pay | Admitting: *Deleted

## 2020-09-17 ENCOUNTER — Other Ambulatory Visit (INDEPENDENT_AMBULATORY_CARE_PROVIDER_SITE_OTHER): Payer: Medicare Other

## 2020-09-17 ENCOUNTER — Other Ambulatory Visit: Payer: Medicare Other

## 2020-09-17 ENCOUNTER — Telehealth: Payer: Self-pay | Admitting: Internal Medicine

## 2020-09-17 DIAGNOSIS — E785 Hyperlipidemia, unspecified: Secondary | ICD-10-CM

## 2020-09-17 DIAGNOSIS — I10 Essential (primary) hypertension: Secondary | ICD-10-CM

## 2020-09-17 DIAGNOSIS — I350 Nonrheumatic aortic (valve) stenosis: Secondary | ICD-10-CM

## 2020-09-17 DIAGNOSIS — Z0000 Encounter for general adult medical examination without abnormal findings: Secondary | ICD-10-CM

## 2020-09-17 NOTE — Telephone Encounter (Signed)
   Patient asking for 3 month lab orders to be entered asap. She plans to have labs today at [facility] 10/25

## 2020-09-17 NOTE — Progress Notes (Signed)
Pt request echo prior to appt in march, ok per Dr Haroldine Laws, order placed

## 2020-09-18 ENCOUNTER — Other Ambulatory Visit: Payer: Self-pay

## 2020-09-18 DIAGNOSIS — I10 Essential (primary) hypertension: Secondary | ICD-10-CM

## 2020-09-18 DIAGNOSIS — E785 Hyperlipidemia, unspecified: Secondary | ICD-10-CM

## 2020-09-18 LAB — LDL CHOLESTEROL, DIRECT: Direct LDL: 93 mg/dL

## 2020-09-18 LAB — LIPID PANEL
Cholesterol: 182 mg/dL (ref 0–200)
HDL: 53.7 mg/dL (ref >39.00)
NonHDL: 128.74
Total CHOL/HDL Ratio: 3
Triglycerides: 245 mg/dL — ABNORMAL HIGH (ref 0.0–149.0)
VLDL: 49 mg/dL — ABNORMAL HIGH (ref 0.0–40.0)

## 2020-09-18 LAB — URINALYSIS, ROUTINE W REFLEX MICROSCOPIC
Bilirubin Urine: NEGATIVE
Hgb urine dipstick: NEGATIVE
Ketones, ur: NEGATIVE
Nitrite: NEGATIVE
RBC / HPF: NONE SEEN (ref 0–?)
Specific Gravity, Urine: 1.015 (ref 1.000–1.030)
Total Protein, Urine: NEGATIVE
Urine Glucose: NEGATIVE
Urobilinogen, UA: 0.2 (ref 0.0–1.0)
pH: 6 (ref 5.0–8.0)

## 2020-09-18 LAB — CBC
HCT: 37.1 % (ref 36.0–46.0)
Hemoglobin: 12.6 g/dL (ref 12.0–15.0)
MCHC: 34.1 g/dL (ref 30.0–36.0)
MCV: 96.4 fl (ref 78.0–100.0)
Platelets: 267 10*3/uL (ref 150.0–400.0)
RBC: 3.84 Mil/uL — ABNORMAL LOW (ref 3.87–5.11)
RDW: 13.3 % (ref 11.5–15.5)
WBC: 5.3 10*3/uL (ref 4.0–10.5)

## 2020-09-18 NOTE — Telephone Encounter (Signed)
OK CMET, CBC, lipids Thx

## 2020-09-18 NOTE — Telephone Encounter (Signed)
Ok UA Thx 

## 2020-09-18 NOTE — Addendum Note (Signed)
Addended by: Elza Rafter D on: 09/18/2020 09:20 AM   Modules accepted: Orders

## 2020-09-18 NOTE — Telephone Encounter (Signed)
Patient at [place] office now and is wondering if she can get those labs ordered.  Patient would like a call when labs are ordered # [phone]

## 2020-09-18 NOTE — Addendum Note (Signed)
Addended by: Elza Rafter D on: 09/18/2020 02:11 PM   Modules accepted: Orders

## 2020-09-18 NOTE — Progress Notes (Signed)
See Phone noted from 10/20

## 2020-09-18 NOTE — Telephone Encounter (Signed)
The patient wanted to leave a urine sample as well.  Do you want to run that for anything?  I will need orders.

## 2020-09-18 NOTE — Telephone Encounter (Signed)
U/a ordered per pt request.

## 2020-09-19 ENCOUNTER — Telehealth: Payer: Self-pay | Admitting: Rheumatology

## 2020-09-19 LAB — COMPREHENSIVE METABOLIC PANEL
ALT: 17 U/L (ref 0–35)
AST: 22 U/L (ref 0–37)
Albumin: 4.2 g/dL (ref 3.5–5.2)
Alkaline Phosphatase: 36 U/L — ABNORMAL LOW (ref 39–117)
BUN: 14 mg/dL (ref 6–23)
CO2: 25 mEq/L (ref 19–32)
Calcium: 9.2 mg/dL (ref 8.4–10.5)
Chloride: 101 mEq/L (ref 96–112)
Creatinine, Ser: 0.73 mg/dL (ref 0.40–1.20)
GFR: 82 mL/min (ref >60.00)
Glucose, Bld: 87 mg/dL (ref 70–99)
Potassium: 4 mEq/L (ref 3.5–5.1)
Sodium: 135 mEq/L (ref 135–145)
Total Bilirubin: 0.6 mg/dL (ref 0.2–1.2)
Total Protein: 6.9 g/dL (ref 6.0–8.3)

## 2020-09-19 NOTE — Telephone Encounter (Signed)
There is nothing else to offer.  She may consider physical therapy.

## 2020-09-19 NOTE — Telephone Encounter (Signed)
Patient advised there is nothing else to offer.  Patient advised she may consider physical therapy. Patient will contact Dr. Vertell Limber to discuss physical therapy with him.

## 2020-09-19 NOTE — Telephone Encounter (Signed)
Per patient still having pain in lower back, and neck. Patient sees Dr. Vertell Limber per Dr. Arlean Hopping recommendation. Patient did not know if Dr. Estanislado Pandy would recommend something else for the pain. Patient uses Voltaren Gel, Ex Strength Tylenol, and Tramadol. Patient has a rov scheduled for 03/07/2021, but wants to come in sooner to discuss issues above. Please advise.

## 2020-09-22 ENCOUNTER — Other Ambulatory Visit: Payer: Self-pay

## 2020-09-22 ENCOUNTER — Encounter: Payer: Self-pay | Admitting: Internal Medicine

## 2020-09-22 ENCOUNTER — Ambulatory Visit (INDEPENDENT_AMBULATORY_CARE_PROVIDER_SITE_OTHER): Payer: Medicare Other | Admitting: Internal Medicine

## 2020-09-22 DIAGNOSIS — E034 Atrophy of thyroid (acquired): Secondary | ICD-10-CM | POA: Diagnosis not present

## 2020-09-22 DIAGNOSIS — M7061 Trochanteric bursitis, right hip: Secondary | ICD-10-CM

## 2020-09-22 DIAGNOSIS — F419 Anxiety disorder, unspecified: Secondary | ICD-10-CM

## 2020-09-22 DIAGNOSIS — G8929 Other chronic pain: Secondary | ICD-10-CM

## 2020-09-22 DIAGNOSIS — M545 Low back pain, unspecified: Secondary | ICD-10-CM

## 2020-09-22 DIAGNOSIS — E782 Mixed hyperlipidemia: Secondary | ICD-10-CM

## 2020-09-22 HISTORY — DX: Trochanteric bursitis, right hip: M70.61

## 2020-09-22 MED ORDER — TRAMADOL HCL 50 MG PO TABS: 50.0000 mg | ORAL_TABLET | Freq: Four times a day (QID) | ORAL | 3 refills | Status: DC | PRN

## 2020-09-22 NOTE — Assessment & Plan Note (Signed)
On Simvastatin 

## 2020-09-22 NOTE — Assessment & Plan Note (Signed)
Tramadol as needed.  Rare use.  Potential benefits of a long term opioids use as well as potential risks (i.e. addiction risk, apnea etc) and complications (i.e. Somnolence, constipation and others) were explained to the patient and were aknowledged.

## 2020-09-22 NOTE — Assessment & Plan Note (Signed)
  Lorazepam prn, - tolerance has developed  Potential benefits of a long term benzodiazepines  use as well as potential risks  and complications were explained to the patient and were aknowledged.

## 2020-09-22 NOTE — Progress Notes (Signed)
Subjective:  Patient ID: [name], female    DOB: [birth year]  Age: 84 y.o. MRN: [id number]  CC: Follow-up   HPI Symia Herdt presents for OA, neck pain Kitzia saw Dr Vertell Limber, Dr Estanislado Pandy -- ??RA C/o R sciatica type sx's x 5 times at night  Outpatient Medications Prior to Visit  Medication Sig Dispense Refill  . Acetaminophen (TYLENOL PO) Take by mouth 3 (three) times daily.     [name] albuterol (PROVENTIL HFA;VENTOLIN HFA) 108 (90 BASE) MCG/ACT inhaler Inhale 2 puffs into the lungs every 6 (six) hours as needed for wheezing. 1 Inhaler 3  . azelastine (ASTELIN) 0.1 % nasal spray Place into both nostrils at bedtime. Use in each nostril as directed    . Calcium Carbonate-Vitamin D (CALCIUM-VITAMIN D) 500-200 MG-UNIT per tablet Take 3 tablets by mouth daily.     . Cholecalciferol 4000 units TABS Take 1 tablet by mouth daily.    [name] desonide (DESOWEN) 0.05 % cream Apply topically 2 (two) times daily. (Patient taking differently: Apply topically 2 (two) times daily as needed. ) 30 g 0  . diclofenac Sodium (VOLTAREN) 1 % GEL APPLY 4 GRAMS 4 TIMES DAILY. 200 g 3  . EPINEPHrine (EPIPEN 2-PAK) 0.3 mg/0.3 mL IJ SOAJ injection Inject 0.3 mLs (0.3 mg total) into the muscle as needed. 1 Device 1  . famotidine (PEPCID) 40 MG tablet TAKE ONE TABLET AT BEDTIME. 30 tablet 5  . ipratropium (ATROVENT) 0.06 % nasal spray Place 1 spray into the nose 3 (three) times daily as needed for rhinitis. 15 mL 0  . levocetirizine (XYZAL) 5 MG tablet Take 5 mg by mouth every evening.    [name] levothyroxine (SYNTHROID) 50 MCG tablet TAKE 1 TABLET ONCE DAILY BEFORE BREAKFAST. 30 tablet 5  . LORazepam (ATIVAN) 1 MG tablet Take 2-3 mg at hs and 1-2 mg in the daytime prn 150 tablet 2  . montelukast (SINGULAIR) 10 MG tablet Take 1 tablet (10 mg total) by mouth at bedtime. 90 tablet 1  . NONFORMULARY OR COMPOUNDED ITEM Estradiol 0.02% vaginal cream S:Insert 69ml vaginally twice a week. 90 each 3  . nystatin-triamcinolone ointment  (MYCOLOG) Apply 1 application topically 2 (two) times daily. (Patient taking differently: Apply 1 application topically 2 (two) times daily as needed. ) 30 g 1  . pantoprazole (PROTONIX) 40 MG tablet Take 1 tablet (40 mg total) by mouth 2 (two) times daily before a meal. 60 tablet 3  . SALINE NASAL SPRAY NA Place into the nose as needed.     . simvastatin (ZOCOR) 40 MG tablet TAKE 1 TABLET ONCE DAILY. (Patient taking differently: Take 40 mg by mouth every other day. ) 90 tablet 0  . traMADol (ULTRAM) 50 MG tablet Take by mouth every 6 (six) hours as needed.    . triamcinolone cream (KENALOG) 0.1 % Apply 1 application topically 3 (three) times daily as needed. 80 g 2  . VESICARE 10 MG tablet Take 1 tablet by mouth as needed.      No facility-administered medications prior to visit.    ROS: Review of Systems  Constitutional: Negative for activity change, appetite change, chills, fatigue and unexpected weight change.  HENT: Negative for congestion, mouth sores and sinus pressure.   Eyes: Negative for visual disturbance.  Respiratory: Negative for cough and chest tightness.   Gastrointestinal: Negative for abdominal pain and nausea.  Genitourinary: Negative for difficulty urinating, frequency and vaginal pain.  Musculoskeletal: Positive for arthralgias, back pain, neck pain and neck  stiffness. Negative for gait problem.  Skin: Negative for pallor and rash.  Neurological: Negative for dizziness, tremors, weakness, numbness and headaches.  Psychiatric/Behavioral: Positive for sleep disturbance. Negative for confusion and suicidal ideas.    Objective:  BP 118/70 (BP Location: Right Arm, Patient Position: Sitting, Cuff Size: Large)   Pulse 81   Temp 97.8 F (36.6 C) (Oral)   Ht 5' 0.5" (1.537 m)   Wt 119 lb 6.4 oz (54.2 kg)   SpO2 96%   BMI 22.93 kg/m   BP Readings from Last 3 Encounters:  09/22/20 118/70  09/02/20 133/79  08/06/20 131/74    Wt Readings from Last 3 Encounters:    09/22/20 119 lb 6.4 oz (54.2 kg)  09/02/20 119 lb 12.8 oz (54.3 kg)  08/06/20 119 lb (54 kg)    Physical Exam Constitutional:      General: She is not in acute distress.    Appearance: She is well-developed.  HENT:     Head: Normocephalic.     Right Ear: External ear normal.     Left Ear: External ear normal.     Nose: Nose normal.  Eyes:     General:        Right eye: No discharge.        Left eye: No discharge.     Conjunctiva/sclera: Conjunctivae normal.     Pupils: Pupils are equal, round, and reactive to light.  Neck:     Thyroid: No thyromegaly.     Vascular: No JVD.     Trachea: No tracheal deviation.  Cardiovascular:     Rate and Rhythm: Normal rate and regular rhythm.     Heart sounds: Normal heart sounds.  Pulmonary:     Effort: No respiratory distress.     Breath sounds: No stridor. No wheezing.  Abdominal:     General: Bowel sounds are normal. There is no distension.     Palpations: Abdomen is soft. There is no mass.     Tenderness: There is no abdominal tenderness. There is no guarding or rebound.  Musculoskeletal:        General: Tenderness present.     Cervical back: Normal range of motion and neck supple.  Lymphadenopathy:     Cervical: No cervical adenopathy.  Skin:    Findings: No erythema or rash.  Neurological:     Cranial Nerves: No cranial nerve deficit.     Motor: No abnormal muscle tone.     Coordination: Coordination normal.     Deep Tendon Reflexes: Reflexes normal.  Psychiatric:        Behavior: Behavior normal.        Thought Content: Thought content normal.        Judgment: Judgment normal.   R troch major - very tender   Lab Results  Component Value Date   WBC 5.3 09/18/2020   HGB 12.6 09/18/2020   HCT 37.1 09/18/2020   PLT 267.0 09/18/2020   GLUCOSE 87 09/18/2020   CHOL 182 09/18/2020   TRIG 245.0 (H) 09/18/2020   HDL 53.70 09/18/2020   LDLDIRECT 93.0 09/18/2020   LDLCALC 90 12/26/2019   ALT 17 09/18/2020   AST 22  09/18/2020   NA 135 09/18/2020   K 4.0 09/18/2020   CL 101 09/18/2020   CREATININE 0.73 09/18/2020   BUN 14 09/18/2020   CO2 25 09/18/2020   TSH 1.98 06/13/2020   HGBA1C 5.5 06/13/2020    MR CERVICAL SPINE WO CONTRAST  Result Date:  07/07/2020 CLINICAL DATA:  84 year old female with chronic neck pain, increasing over the past 6 weeks a especially with head turning to the right. EXAM: MRI CERVICAL SPINE WITHOUT CONTRAST TECHNIQUE: Multiplanar, multisequence MR imaging of the cervical spine was performed. No intravenous contrast was administered. COMPARISON:  Cervical spine radiographs 12/26/2018. Brain MRI 03/06/2020. face CT without contrast 08/13/2016. FINDINGS: Alignment: Chronic straightening of cervical lordosis with mild degenerative appearing anterolisthesis of C2 on C3, stable since 2020. Similar mild anterolisthesis of C3 on C4, although that level one which is is ankylosed (see below). Vertebrae: Altered appearance of the craniocervical junction since the April brain MRI (series 5, image 7 and series 4, image 7) appears related to a bulky fluid collection which is insinuated about the anterior C1 odontoid and posterior C1 occipital joint space. There is mild extension into the dorsal posterior fossa which is new since April (same images). Trace bilateral fluid also in the bilateral occipital condyle-C1 and C1-C2 lateral articulations, greater on the right. But despite this new fluid collection there is no associated marrow edema at the skull base or in the upper cervical vertebrae. There is chronic appearing remodeling of the odontoid with partially calcified chronic dorsal pannus which was present on the 2017 CT. Also, the fluid collections are fairly sharply demarcated, no definite regional soft tissue inflammation associated. 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 Degenerative endplate marrow signal changes in the cervical spine but normal background bone marrow  signal. No marrow edema identified. Cord: No cervical spinal cord signal abnormality despite multilevel spinal stenosis with some cord mass effect. No abnormal fluid collection within the spinal canal. Posterior Fossa, vertebral arteries, paraspinal tissues: Preserved major vascular flow voids in the neck with a partially retropharyngeal course of both carotids. No neck soft tissue inflammation identified. Negative visible lung apices. Abnormality of the posterior fossa new since 03/06/2020, but otherwise stable, negative visible brain parenchyma. Disc levels: C1-C2: Mild mass effect on the thecal sac primarily due to chronic ligamentous hypertrophy posterior to the odontoid, but no significant cervicomedullary stenosis. C2-C3: Mild spinal stenosis related to moderate posterior element hypertrophy including the ligament flavum. Mild left and moderate to severe right C3 foraminal stenosis. C3-C4: Interbody ankylosis. Mild spinal stenosis. Mild to moderate right C4 foraminal stenosis. C4-C5: Severe disc space loss. Circumferential disc osteophyte complex with mild posterior element hypertrophy. Mild to moderate spinal stenosis and spinal cord mass effect (series 7, image 11). Mild left and severe right C5 foraminal stenosis. C5-C6: Ankylosis. Rightward endplate spurring and facet hypertrophy. Mild spinal stenosis and spinal cord mass effect. Moderate to severe right C6 foraminal stenosis. C6-C7: Disc space loss with circumferential disc osteophyte complex. Mild posterior element hypertrophy. Mild spinal stenosis. Mild if any cord mass effect. Moderate to severe bilateral C7 foraminal stenosis. C7-T1: Disc space loss. Left eccentric circumferential disc osteophyte complex. Mild to moderate posterior element hypertrophy. No spinal stenosis. Moderate to severe bilateral C8 foraminal stenosis. No visible upper thoracic spinal stenosis. IMPRESSION: 1. Bulky and insinuating new fluid collection at the cranial cervical  junction since the brain MRI on 03/06/2020. There is even some extension of fluid into the posterior fossa of the posterior fossa. Favored differential considerations are large degenerative synovial cyst and/or joint effusion. Acquired Pseudomeningocele is also in the differential diagnosis. Abscess was considered but is felt unlikely given the absence of adjacent bone marrow edema and regional soft tissue inflammation. 2. Underlying chronic degeneration and ligamentous hypertrophy about the odontoid process. But no significant cervicomedullary  junction stenosis. 3. 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 Some associated spinal cord mass effect but no cord signal abnormality. 4. Moderate to severe degenerative neural foraminal stenosis at the right C3, right C5, right C6, bilateral C7 and C8 nerve levels. Electronically Signed   By: Genevie Ann M.D.   On: 07/07/2020 20:49    Assessment & Plan:     Follow-up: No follow-ups on file.  Walker Kehr, MD

## 2020-09-22 NOTE — Assessment & Plan Note (Signed)
Labs

## 2020-09-22 NOTE — Patient Instructions (Addendum)
Bursitis  Bursitis is when the fluid-filled sac (bursa) that covers and protects a joint is swollen (inflamed). Bursitis is most common near joints such as the knees, elbows, hips, and shoulders. It can cause pain and stiffness. Follow these instructions at home: Medicines  Take over-the-counter and prescription medicines only as told by your doctor.  If you were prescribed an antibiotic medicine, take it as told by your doctor. Do not stop taking it even if you start to feel better. General instructions   Rest the affected area as told by your doctor. ? If you can, raise (elevate) the affected area above the level of your heart while you are sitting or lying down. ? Avoid doing things that make the pain worse.  Use a splint, brace, pad, or walking aid as told by your doctor.  If directed, put ice on the affected area: ? If you have a removable splint or brace, take it off as told by your doctor. ? Put ice in a plastic bag. ? Place a towel between your skin and the bag, or between the splint or brace and the bag. ? Leave the ice on for 20 minutes, 2-3 times a day.  Keep all follow-up visits as told by your doctor. This is important. Preventing symptoms Do these things to help you not have symptoms again:  Wear knee pads if you kneel often.  Wear sturdy running or walking shoes that fit you well.  Take a lot of breaks during activities that involve doing the same movements again and again.  Before you do any activity that takes a lot of effort, get your body ready by stretching.  Stay at a healthy weight or lose weight if your doctor says you should. If you need help doing this, ask your doctor.  Exercise often. If you start any new physical activity, do it slowly. Contact a doctor if you:  Have a fever.  Have chills.  Have symptoms that do not get better with treatment or home care. Summary  Bursitis is when the fluid-filled sac (bursa) that covers and protects a joint  is swollen.  Rest the affected area as told by your doctor.  Avoid doing things that make the pain worse.  Put ice on the affected area as told by your doctor. This information is not intended to replace advice given to you by your health care provider. Make sure you discuss any questions you have with your health care provider. Document Revised: 10/28/2017 Document Reviewed: 09/30/2017 Elsevier Patient Education  [place] [facility] Ask your health care provider which exercises are safe for you. Do exercises exactly as told by your health care provider and adjust them as directed. It is normal to feel mild stretching, pulling, tightness, or discomfort as you do these exercises. Stop right away if you feel sudden pain or your pain gets significantly worse. Do not begin these exercises until told by your health care provider. Stretching and range-of-motion exercises These exercises warm up your muscles and joints and improve the movement and flexibility of your hip and pelvis. Quadriceps stretch, prone  1. Lie on your abdomen on a firm surface, such as a bed or padded floor (prone position). 2. Bend your left / right knee and reach back to hold your ankle or pant leg. If you cannot reach your ankle or pant leg, loop a belt around your foot and grab the belt instead. 3. Gently pull  your heel toward your buttocks. Your knee should not slide out to the side. You should feel a stretch in the front of your thigh and knee (quadriceps). 4. Hold this position for __________ seconds. Repeat __________ times. Complete this exercise __________ times a day. Iliotibial band stretch An iliotibial band is a strong band of muscle tissue that runs from the outer side of your hip to the outer side of your thigh and knee. 1. Lie on your side with your left / right leg in the top position. 2. Bend both of your knees and grab your left / right ankle. Stretch out your  bottom arm to help you balance. 3. Slowly bring your top knee back so your thigh goes behind your trunk. 4. Slowly lower your top leg toward the floor until you feel a gentle stretch on the outside of your left / right hip and thigh. If you do not feel a stretch and your knee will not fall farther, place the heel of your other foot on top of your knee and pull your knee down toward the floor with your foot. 5. Hold this position for __________ seconds. Repeat __________ times. Complete this exercise __________ times a day. Strengthening exercises These exercises build strength and endurance in your hip and pelvis. Endurance is the ability to use your muscles for a long time, even after they get tired. Straight leg raises, side-lying This exercise strengthens the muscles that rotate the leg at the hip and move it away from your body (hip abductors). 1. Lie on your side with your left / right leg in the top position. Lie so your head, shoulder, hip, and knee line up. You may bend your bottom knee to help you balance. 2. Roll your hips slightly forward so your hips are stacked directly over each other and your left / right knee is facing forward. 3. Tense the muscles in your outer thigh and lift your top leg 4-6 inches (10-15 cm). 4. Hold this position for __________ seconds. 5. Slowly return to the starting position. Let your muscles relax completely before doing another repetition. Repeat __________ times. Complete this exercise __________ times a day. Leg raises, prone This exercise strengthens the muscles that move the hips (hip extensors). 1. Lie on your abdomen on your bed or a firm surface. You can put a pillow under your hips if that is more comfortable for your lower back. 2. Bend your left / right knee so your foot is straight up in the air. 3. Squeeze your buttocks muscles and lift your left / right thigh off the bed. Do not let your back arch. 4. Tense your thigh muscle as hard as you  can without increasing any knee pain. 5. Hold this position for __________ seconds. 6. Slowly lower your leg to the starting position and allow it to relax completely. Repeat __________ times. Complete this exercise __________ times a day. Hip hike 1. Stand sideways on a bottom step. Stand on your left / right leg with your other foot unsupported next to the step. You can hold on to the railing or wall for balance if needed. 2. Keep your knees straight and your torso square. Then lift your left / right hip up toward the ceiling. 3. Slowly let your left / right hip lower toward the floor, past the starting position. Your foot should get closer to the floor. Do not lean or bend your knees. Repeat __________ times. Complete this exercise __________ times a day. This  information is not intended to replace advice given to you by your health care provider. Make sure you discuss any questions you have with your health care provider. Document Revised: 03/08/2019 Document Reviewed: 09/06/2018 Elsevier Patient Education  [place].

## 2020-09-29 ENCOUNTER — Other Ambulatory Visit: Payer: Self-pay | Admitting: Internal Medicine

## 2020-10-01 ENCOUNTER — Telehealth: Payer: Self-pay | Admitting: *Deleted

## 2020-10-01 NOTE — Telephone Encounter (Signed)
Deductible $264met  OOP MAX N/A  Annual exam 07/02/2020 jk  Calcium  9.2           Date 09/18/2020  Upcoming dental procedures   Prior Authorization needed NO  Pt estimated Cost $0   10/29/2020 SAMAE DAY AS DEXA    Coverage Details: $0 ONE DOSE, $0 ADMIN FEE

## 2020-10-07 ENCOUNTER — Ambulatory Visit: Payer: Medicare Other | Admitting: Rheumatology

## 2020-10-08 ENCOUNTER — Ambulatory Visit: Payer: Medicare Other | Admitting: Rheumatology

## 2020-10-29 ENCOUNTER — Ambulatory Visit (INDEPENDENT_AMBULATORY_CARE_PROVIDER_SITE_OTHER): Payer: Medicare Other | Admitting: Anesthesiology

## 2020-10-29 ENCOUNTER — Other Ambulatory Visit: Payer: Self-pay | Admitting: Obstetrics and Gynecology

## 2020-10-29 ENCOUNTER — Ambulatory Visit (INDEPENDENT_AMBULATORY_CARE_PROVIDER_SITE_OTHER): Payer: Medicare Other

## 2020-10-29 ENCOUNTER — Other Ambulatory Visit: Payer: Self-pay

## 2020-10-29 DIAGNOSIS — M810 Age-related osteoporosis without current pathological fracture: Secondary | ICD-10-CM

## 2020-10-29 DIAGNOSIS — Z780 Asymptomatic menopausal state: Secondary | ICD-10-CM

## 2020-10-29 DIAGNOSIS — Z01419 Encounter for gynecological examination (general) (routine) without abnormal findings: Secondary | ICD-10-CM

## 2020-10-29 MED ORDER — DENOSUMAB 60 MG/ML ~~LOC~~ SOSY
60.0000 mg | PREFILLED_SYRINGE | Freq: Once | SUBCUTANEOUS | Status: AC
Start: 2020-10-29 — End: 2020-10-29
  Administered 2020-10-29: 60 mg via SUBCUTANEOUS

## 2020-10-30 ENCOUNTER — Encounter: Payer: Self-pay | Admitting: Podiatry

## 2020-10-30 ENCOUNTER — Other Ambulatory Visit: Payer: Self-pay | Admitting: Internal Medicine

## 2020-10-30 ENCOUNTER — Ambulatory Visit (INDEPENDENT_AMBULATORY_CARE_PROVIDER_SITE_OTHER): Payer: Medicare Other | Admitting: Podiatry

## 2020-10-30 DIAGNOSIS — M2042 Other hammer toe(s) (acquired), left foot: Secondary | ICD-10-CM | POA: Diagnosis not present

## 2020-10-30 DIAGNOSIS — L02612 Cutaneous abscess of left foot: Secondary | ICD-10-CM | POA: Diagnosis not present

## 2020-10-30 DIAGNOSIS — L03032 Cellulitis of left toe: Secondary | ICD-10-CM

## 2020-10-30 MED ORDER — CEPHALEXIN 500 MG PO CAPS: 500.0000 mg | ORAL_CAPSULE | Freq: Three times a day (TID) | ORAL | 1 refills | Status: DC

## 2020-10-31 NOTE — Progress Notes (Signed)
Subjective:   Patient ID: [name], female   DOB: 84 y.o.   MRN: [id number]   HPI Patient presents stating I wore tighter shoes and I have this area on top of my left second toe and I know I have really bad deformity of my digits and it is been painful   ROS      Objective:  Physical Exam  Neurovascular status is intact with severe forefoot derangement left with severe elevation rigid contracture digit to left with abrasion on top of the toe that is irritated with redness around the area localized      Assessment:  Tissue that is been a braised and painful secondary to pressure from shoe gear with possible localized cellulitic or abscess process     Plan:  Sterile prep and debrided the tissue and patient denied wanting an x-ray.  I discussed the possibility for bone infection or other pathology and she will wear open toed shoe soak and I placed her on cephalexin 500 mg 3 times daily as precautionary measure.  Gave her strict instructions to let us know of any changes were to occur and hopefully this will heal uneventfully

## 2020-11-03 ENCOUNTER — Other Ambulatory Visit: Payer: Self-pay | Admitting: Internal Medicine

## 2020-11-03 DIAGNOSIS — Z1231 Encounter for screening mammogram for malignant neoplasm of breast: Secondary | ICD-10-CM

## 2020-11-04 NOTE — Progress Notes (Signed)
Office Visit Note  Patient: [name]             Date of Birth: 1936/02/12           MRN: [id number]             PCP: Cassandria Anger, MD Referring: Cassandria Anger, MD Visit Date: 11/18/2020 Occupation: @GUAROCC @  Subjective:  Other (Right leg and foot pain )   History of Present Illness: [name] is a 84 y.o. female with history of degenerative disc disease, osteoarthritis and osteoporosis.  She states she has been seeing Dr. Vertell Limber for neck pain.  She still has to take tramadol and Tylenol on a regular basis to relieve pain and discomfort.  She states recently she has been having pain when she sleeping on the right side.  She states the pain starts in her right hip and radiates down her leg.  She has been also experiencing pain in her right foot.  She states her lower back pain  persist.  Activities of Daily Living:  Patient reports morning stiffness for 1 hour.   Patient Reports nocturnal pain.  Difficulty dressing/grooming: Denies Difficulty climbing stairs: Denies Difficulty getting out of chair: Denies Difficulty using hands for taps, buttons, cutlery, and/or writing: Denies  Review of Systems  Constitutional: Negative for fatigue.  HENT: Positive for mouth dryness. Negative for mouth sores and nose dryness.   Eyes: Negative for pain, itching and dryness.  Respiratory: Negative for shortness of breath and difficulty breathing.   Cardiovascular: Negative for chest pain and palpitations.  Gastrointestinal: Positive for constipation. Negative for blood in stool and diarrhea.  Endocrine: Negative for increased urination.  Genitourinary: Negative for difficulty urinating.  Musculoskeletal: Positive for arthralgias, joint pain, myalgias, morning stiffness and myalgias. Negative for joint swelling and muscle tenderness.  Skin: Negative for color change, rash and redness.  Allergic/Immunologic: Negative for susceptible to infections.  Neurological: Positive for  memory loss. Negative for dizziness, numbness, headaches and weakness.  Hematological: Positive for bruising/bleeding tendency.  Psychiatric/Behavioral: Negative for confusion.    PMFS History:  Patient Active Problem List   Diagnosis Date Noted  . Trochanteric bursitis of right hip 09/22/2020  . Arthralgia 03/18/2020  . RUQ pain 01/02/2020  . Cholelithiasis 09/09/2019  . Ear pain, bilateral 03/21/2019  . Neck pain 12/26/2018  . Acute pain of left shoulder 12/26/2018  . Anxiety 08/22/2018  . Contact dermatitis and eczema 06/14/2018  . Other constipation 11/11/2016  . Low back pain 11/10/2016  . Leg abrasion 08/12/2016  . Contusion of right knee 08/12/2016  . Sinusitis, chronic 08/06/2016  . Rash and nonspecific skin eruption 04/20/2016  . Aortic valve sclerosis 03/12/2015  . Contusion of left chest wall 01/09/2015  . Fatigue 09/08/2014  . Postherpetic neuralgia 10/02/2013  . Impacted cerumen of right ear 07/16/2013  . Headache 07/10/2013  . Aortic stenosis 04/19/2013  . Insomnia 01/24/2013  . Cough 08/07/2012  . URI (upper respiratory infection) 08/03/2012  . Pruritus of skin 03/11/2012  . MVP (mitral valve prolapse)   . Multiple thyroid nodules 10/04/2011  . Herpes zoster 09/07/2011  . Essential hypertension, benign 07/27/2011  . Atrophic vaginitis   . Well adult exam 03/15/2011  . Hyperlipemia, mixed 03/15/2011  . OVERACTIVE BLADDER 09/02/2010  . Hypothyroidism 03/03/2010  . CALF PAIN, LEFT 10/27/2009  . Belching 07/21/2009  . EAR PAIN 12/19/2008  . Allergic rhinitis 12/19/2008  . Irritable bowel syndrome 09/04/2008  . PERSONAL HX COLONIC POLYPS 09/04/2008  .  GASTRITIS, CHRONIC 09/03/2008  . DUODENITIS, WITHOUT HEMORRHAGE 09/03/2008  . TIBIALIS TENDINITIS 03/20/2008  . OSTEOARTHRITIS 02/25/2008  . SWEATING 02/21/2008  . LACTOSE INTOLERANCE 11/20/2007  . Dyslipidemia 11/20/2007  . GERD 11/20/2007  . Osteoporosis 11/20/2007    Past Medical History:   Diagnosis Date  . Allergic rhinitis   . Atrophic vaginitis   . Baker's cyst    Left-Dr. Aluisio  . Cataract    Dr. Katy Fitch  . Colon polyps   . Fibroid   . Gastritis    chronic  . GERD (gastroesophageal reflux disease)   . Heart murmur   . Hemorrhoids   . Hyperlipidemia   . IBS (irritable bowel syndrome)   . MVP (mitral valve prolapse)    Antibiotics required for dental procedures  . OA (osteoarthritis)   . Osteoporosis 06/2018   T score -2.2 stable on Prolia  . Overactive bladder   . Thyroid disease   . Thyroid nodule   . Torn meniscus    bilateral    Family History  Problem Relation Age of Onset  . Heart disease Mother   . Hypertension Mother   . Osteoporosis Mother   . Congestive Heart Failure Mother   . Pancreatic cancer Father   . Heart attack Brother   . Drug abuse Brother        over dose   . Healthy Son   . Healthy Son   . Colon cancer Neg Hx   . Colon polyps Neg Hx   . Rectal cancer Neg Hx   . Stomach cancer Neg Hx    Past Surgical History:  Procedure Laterality Date  . CATARACT EXTRACTION, BILATERAL    . TONSILLECTOMY     Social History   Social History Narrative   Regular exercise-yes   Daily caffeine use   Immunization History  Administered Date(s) Administered  . Fluad Quad(high Dose 65+) 08/04/2019  . Influenza Whole 09/29/2007, 08/21/2008, 09/02/2010, 07/30/2012  . Influenza, High Dose Seasonal PF 09/03/2013, 08/06/2016, 08/20/2017, 09/20/2018  . Influenza,inj,Quad PF,6+ Mos 12/17/2014, 07/30/2015  . PFIZER SARS-COV-2 Vaccination 12/12/2019, 12/31/2019, 07/31/2020  . Pneumococcal Conjugate-13 01/09/2014  . Pneumococcal Polysaccharide-23 09/26/2006, 08/12/2015  . Td 04/14/2010  . Tdap 06/18/2020  . Typhoid Inactivated 02/23/2013  . Zoster 12/09/2006     Objective: Vital Signs: BP 123/80 (BP Location: Left Arm, Patient Position: Sitting, Cuff Size: Normal)   Pulse 77   Resp 13   Ht 5' (1.524 m)   Wt 121 lb (54.9 kg)   BMI 23.63  kg/m    Physical Exam Vitals and nursing note reviewed.  Constitutional:      Appearance: She is well-developed and well-nourished.  HENT:     Head: Normocephalic and atraumatic.  Eyes:     Extraocular Movements: EOM normal.     Conjunctiva/sclera: Conjunctivae normal.  Cardiovascular:     Rate and Rhythm: Normal rate and regular rhythm.     Pulses: Intact distal pulses.     Heart sounds: Normal heart sounds.  Pulmonary:     Effort: Pulmonary effort is normal.     Breath sounds: Normal breath sounds.  Abdominal:     General: Bowel sounds are normal.     Palpations: Abdomen is soft.  Musculoskeletal:     Cervical back: Normal range of motion.  Lymphadenopathy:     Cervical: No cervical adenopathy.  Skin:    General: Skin is warm and dry.     Capillary Refill: Capillary refill takes less than 2 seconds.  Neurological:     Mental Status: She is alert and oriented to person, place, and time.  Psychiatric:        Mood and Affect: Mood and affect normal.        Behavior: Behavior normal.      Musculoskeletal Exam: She has limited range of motion of the cervical and lumbar spine.  Shoulder joints, elbow joints were in good range of motion.  She has bilateral DIP thickening and subluxation.  She tenderness on palpation of right trochanteric bursa.  Hip joints and knee joints with good range of motion.  She has tenderness over the right plantar fascia.  CDAI Exam: CDAI Score: -- Patient Global: --; Provider Global: -- Swollen: --; Tender: -- Joint Exam 11/18/2020   No joint exam has been documented for this visit   There is currently no information documented on the homunculus. Go to the Rheumatology activity and complete the homunculus joint exam.  Investigation: No additional findings.  Imaging: MM 3D SCREEN BREAST BILATERAL  Result Date: 11/08/2020 CLINICAL DATA:  Screening. EXAM: DIGITAL SCREENING BILATERAL MAMMOGRAM WITH TOMO AND CAD COMPARISON:  Previous exam(s).  ACR Breast Density Category b: There are scattered areas of fibroglandular density. FINDINGS: There are no findings suspicious for malignancy. Images were processed with CAD. IMPRESSION: No mammographic evidence of malignancy. A result letter of this screening mammogram will be mailed directly to the patient. RECOMMENDATION: Screening mammogram in one year. (Code:SM-B-01Y) BI-RADS CATEGORY  1: Negative. Electronically Signed   By: Margarette Canada M.D.   On: 11/08/2020 10:11    Recent Labs: Lab Results  Component Value Date   WBC 5.3 09/18/2020   HGB 12.6 09/18/2020   PLT 267.0 09/18/2020   NA 135 09/18/2020   K 4.0 09/18/2020   CL 101 09/18/2020   CO2 25 09/18/2020   GLUCOSE 87 09/18/2020   BUN 14 09/18/2020   CREATININE 0.73 09/18/2020   BILITOT 0.6 09/18/2020   ALKPHOS 36 (L) 09/18/2020   AST 22 09/18/2020   ALT 17 09/18/2020   PROT 6.9 09/18/2020   ALBUMIN 4.2 09/18/2020   CALCIUM 9.2 09/18/2020   GFRAA 87 07/17/2020    Speciality Comments: No specialty comments available.  Procedures:  Large Joint Inj: R greater trochanter on 11/18/2020 3:11 PM Indications: pain Details: 27 G 1.5 in needle, lateral approach  Arthrogram: No  Medications: 40 mg triamcinolone acetonide 40 MG/ML; 1.5 mL lidocaine 1 % Aspirate: 0 mL Outcome: tolerated well, no immediate complications Procedure, treatment alternatives, risks and benefits explained, specific risks discussed. Consent was given by the patient. Immediately prior to procedure a time out was called to verify the correct patient, procedure, equipment, support staff and site/side marked as required. Patient was prepped and draped in the usual sterile fashion.     Allergies: Bee venom, Clarithromycin, Doxycycline, and Neosporin [neomycin-bacitracin zn-polymyx]   Assessment / Plan:     Visit Diagnoses: DDD (degenerative disc disease), cervical - Evaluated by Dr.Chu at [facility].  He felt symptoms are related to synovial cyst and DDD.    C1-C2 instability - MRI consistent with degenerative disc disease, facet joint arthropathy, spinal stenosis and fluid collection at craniocervical junction.  She is followed by Dr. Vertell Limber now.  DDD (degenerative disc disease), lumbar - She was referred to physical therapy at [facility].  Patient decided not to do the physical therapy.  A handout on exercises was given.  Chronic SI joint pain-she had no SI joint tenderness.  Trochanteric bursitis of right hip-she  had tenderness over the right trochanteric bursa.  She is also experiencing nocturnal pain.  After informed consent was obtained right trochanteric bursa was injected with cortisone as described above.  She tolerated the procedure well.  A handout on exercises was given.  Primary osteoarthritis of both hands - Dr. Sherrian Divers performed ultrasound of bilateral hands which was negative for synovitis.  All autoimmune work-up was negative.  Plantar fasciitis of left foot-she has been having discomfort in the right foot fascial.  Proper fitting shoes were discussed.  Have given her a handout on exercises.  Primary osteoarthritis of both feet-she has been having increased discomfort in her feet.  Age-related osteoporosis without current pathological fracture - She is on Prolia by her GYN, calcium and vitamin D.  According to patient her BMD has increased.  Other medical problems are listed as follows:[name]  Essential hypertension, benign  MVP (mitral valve prolapse)  History of hyperlipidemia  Aortic valve sclerosis  Hx of colonic polyps  History of gastroesophageal reflux (GERD)  History of IBS  Multiple thyroid nodules  Calculus of gallbladder without cholecystitis without obstruction  Hypothyroidism due to acquired atrophy of thyroid  Orders: No orders of the defined types were placed in this encounter.  No orders of the defined types were placed in this encounter.     Follow-Up Instructions: Return in about 6 months  (around 05/19/2021) for Osteoarthritis, Osteoporosis.   Bo Merino, MD  Note - This record has been created using Editor, commissioning.  Chart creation errors have been sought, but may not always  have been located. Such creation errors do not reflect on  the standard of medical care.

## 2020-11-05 ENCOUNTER — Other Ambulatory Visit: Payer: Self-pay

## 2020-11-05 ENCOUNTER — Ambulatory Visit
Admission: RE | Admit: 2020-11-05 | Discharge: 2020-11-05 | Disposition: A | Discharge status: Home / Self Care | Payer: Medicare Other | Source: Ambulatory Visit | Attending: Internal Medicine | Admitting: Internal Medicine

## 2020-11-05 DIAGNOSIS — Z1231 Encounter for screening mammogram for malignant neoplasm of breast: Secondary | ICD-10-CM | POA: Diagnosis not present

## 2020-11-12 DIAGNOSIS — M4312 Spondylolisthesis, cervical region: Secondary | ICD-10-CM | POA: Diagnosis not present

## 2020-11-12 DIAGNOSIS — M5030 Other cervical disc degeneration, unspecified cervical region: Secondary | ICD-10-CM | POA: Diagnosis not present

## 2020-11-12 DIAGNOSIS — M47812 Spondylosis without myelopathy or radiculopathy, cervical region: Secondary | ICD-10-CM | POA: Diagnosis not present

## 2020-11-12 DIAGNOSIS — M069 Rheumatoid arthritis, unspecified: Secondary | ICD-10-CM | POA: Diagnosis not present

## 2020-11-12 DIAGNOSIS — M542 Cervicalgia: Secondary | ICD-10-CM | POA: Diagnosis not present

## 2020-11-13 ENCOUNTER — Encounter: Payer: Self-pay | Admitting: Obstetrics and Gynecology

## 2020-11-15 ENCOUNTER — Other Ambulatory Visit: Payer: Self-pay | Admitting: Internal Medicine

## 2020-11-18 ENCOUNTER — Other Ambulatory Visit: Payer: Self-pay | Admitting: Internal Medicine

## 2020-11-18 ENCOUNTER — Other Ambulatory Visit: Payer: Self-pay

## 2020-11-18 ENCOUNTER — Ambulatory Visit (INDEPENDENT_AMBULATORY_CARE_PROVIDER_SITE_OTHER): Payer: Medicare Other | Admitting: Rheumatology

## 2020-11-18 ENCOUNTER — Encounter: Payer: Self-pay | Admitting: Rheumatology

## 2020-11-18 VITALS — BP 123/80 | HR 77 | Resp 13 | Ht 60.0 in | Wt 121.0 lb

## 2020-11-18 DIAGNOSIS — E034 Atrophy of thyroid (acquired): Secondary | ICD-10-CM

## 2020-11-18 DIAGNOSIS — M533 Sacrococcygeal disorders, not elsewhere classified: Secondary | ICD-10-CM

## 2020-11-18 DIAGNOSIS — M7061 Trochanteric bursitis, right hip: Secondary | ICD-10-CM

## 2020-11-18 DIAGNOSIS — M810 Age-related osteoporosis without current pathological fracture: Secondary | ICD-10-CM | POA: Diagnosis not present

## 2020-11-18 DIAGNOSIS — M19041 Primary osteoarthritis, right hand: Secondary | ICD-10-CM | POA: Diagnosis not present

## 2020-11-18 DIAGNOSIS — M19071 Primary osteoarthritis, right ankle and foot: Secondary | ICD-10-CM | POA: Diagnosis not present

## 2020-11-18 DIAGNOSIS — M5136 Other intervertebral disc degeneration, lumbar region: Secondary | ICD-10-CM

## 2020-11-18 DIAGNOSIS — K8020 Calculus of gallbladder without cholecystitis without obstruction: Secondary | ICD-10-CM

## 2020-11-18 DIAGNOSIS — Z8639 Personal history of other endocrine, nutritional and metabolic disease: Secondary | ICD-10-CM | POA: Diagnosis not present

## 2020-11-18 DIAGNOSIS — M19042 Primary osteoarthritis, left hand: Secondary | ICD-10-CM

## 2020-11-18 DIAGNOSIS — M722 Plantar fascial fibromatosis: Secondary | ICD-10-CM | POA: Diagnosis not present

## 2020-11-18 DIAGNOSIS — M19072 Primary osteoarthritis, left ankle and foot: Secondary | ICD-10-CM

## 2020-11-18 DIAGNOSIS — G8929 Other chronic pain: Secondary | ICD-10-CM

## 2020-11-18 DIAGNOSIS — I358 Other nonrheumatic aortic valve disorders: Secondary | ICD-10-CM

## 2020-11-18 DIAGNOSIS — I10 Essential (primary) hypertension: Secondary | ICD-10-CM

## 2020-11-18 DIAGNOSIS — E042 Nontoxic multinodular goiter: Secondary | ICD-10-CM

## 2020-11-18 DIAGNOSIS — M5030 Other cervical disc degeneration, unspecified cervical region: Secondary | ICD-10-CM

## 2020-11-18 DIAGNOSIS — I341 Nonrheumatic mitral (valve) prolapse: Secondary | ICD-10-CM

## 2020-11-18 DIAGNOSIS — M532X1 Spinal instabilities, occipito-atlanto-axial region: Secondary | ICD-10-CM | POA: Diagnosis not present

## 2020-11-18 DIAGNOSIS — Z8719 Personal history of other diseases of the digestive system: Secondary | ICD-10-CM

## 2020-11-18 DIAGNOSIS — Z86010 Personal history of colonic polyps: Secondary | ICD-10-CM

## 2020-11-18 MED ORDER — TRIAMCINOLONE ACETONIDE 40 MG/ML IJ SUSP
40.0000 mg | INTRAMUSCULAR | Status: AC | PRN
  Administered 2020-11-18: 40 mg via INTRA_ARTICULAR

## 2020-11-18 MED ORDER — LIDOCAINE HCL 1 % IJ SOLN
1.5000 mL | INTRAMUSCULAR | Status: AC | PRN
  Administered 2020-11-18: 1.5 mL

## 2020-11-18 NOTE — Patient Instructions (Addendum)
Plantar Fasciitis Rehab Ask your health care provider which exercises are safe for you. Do exercises exactly as told by your health care provider and adjust them as directed. It is normal to feel mild stretching, pulling, tightness, or discomfort as you do these exercises. Stop right away if you feel sudden pain or your pain gets worse. Do not begin these exercises until told by your health care provider. Stretching and range-of-motion exercises These exercises warm up your muscles and joints and improve the movement and flexibility of your foot. These exercises also help to relieve pain. Plantar fascia stretch  1. Sit with your left / right leg crossed over your opposite knee. 2. Hold your heel with one hand with that thumb near your arch. With your other hand, hold your toes and gently pull them back toward the top of your foot. You should feel a stretch on the bottom of your toes or your foot (plantar fascia) or both. 3. Hold this stretch for__________ seconds. 4. Slowly release your toes and return to the starting position. Repeat __________ times. Complete this exercise __________ times a day. Gastrocnemius stretch, standing This exercise is also called a calf (gastroc) stretch. It stretches the muscles in the back of the upper calf. 1. Stand with your hands against a wall. 2. Extend your left / right leg behind you, and bend your front knee slightly. 3. Keeping your heels on the floor and your back knee straight, shift your weight toward the wall. Do not arch your back. You should feel a gentle stretch in your upper left / right calf. 4. Hold this position for __________ seconds. Repeat __________ times. Complete this exercise __________ times a day. Soleus stretch, standing This exercise is also called a calf (soleus) stretch. It stretches the muscles in the back of the lower calf. 1. Stand with your hands against a wall. 2. Extend your left / right leg behind you, and bend your front  knee slightly. 3. Keeping your heels on the floor, bend your back knee and shift your weight slightly over your back leg. You should feel a gentle stretch deep in your lower calf. 4. Hold this position for __________ seconds. Repeat __________ times. Complete this exercise __________ times a day. Gastroc and soleus stretch, standing step This exercise stretches the muscles in the back of the lower leg. These muscles are in the upper calf (gastrocnemius) and the lower calf (soleus). 1. Stand with the ball of your left / right foot on a step. The ball of your foot is on the walking surface, right under your toes. 2. Keep your other foot firmly on the same step. 3. Hold on to the wall or a railing for balance. 4. Slowly lift your other foot, allowing your body weight to press your left / right heel down over the edge of the step. You should feel a stretch in your left / right calf. 5. Hold this position for __________ seconds. 6. Return both feet to the step. 7. Repeat this exercise with a slight bend in your left / right knee. Repeat __________ times with your left / right knee straight and __________ times with your left / right knee bent. Complete this exercise __________ times a day. Balance exercise This exercise builds your balance and strength control of your arch to help take pressure off your plantar fascia. Single leg stand If this exercise is too easy, you can try it with your eyes closed or while standing on a pillow. 1.   Without shoes, stand near a railing or in a doorway. You may hold on to the railing or door frame as needed. 2. Stand on your left / right foot. Keep your big toe down on the floor and try to keep your arch lifted. Do not let your foot roll inward. 3. Hold this position for __________ seconds. Repeat __________ times. Complete this exercise __________ times a day. This information is not intended to replace advice given to you by your health care provider. Make sure  you discuss any questions you have with your health care provider. Document Revised: 03/08/2019 Document Reviewed: 09/13/2018 Elsevier Patient Education  Allison Band Syndrome Rehab Ask your health care provider which exercises are safe for you. Do exercises exactly as told by your health care provider and adjust them as directed. It is normal to feel mild stretching, pulling, tightness, or discomfort as you do these exercises. Stop right away if you feel sudden pain or your pain gets significantly worse. Do not begin these exercises until told by your health care provider. Stretching and range-of-motion exercises These exercises warm up your muscles and joints and improve the movement and flexibility of your hip and pelvis. Quadriceps stretch, prone  1. Lie on your abdomen on a firm surface, such as a bed or padded floor (prone position). 2. Bend your left / right knee and reach back to hold your ankle or pant leg. If you cannot reach your ankle or pant leg, loop a belt around your foot and grab the belt instead. 3. Gently pull your heel toward your buttocks. Your knee should not slide out to the side. You should feel a stretch in the front of your thigh and knee (quadriceps). 4. Hold this position for __________ seconds. Repeat __________ times. Complete this exercise __________ times a day. Iliotibial band stretch An iliotibial band is a strong band of muscle tissue that runs from the outer side of your hip to the outer side of your thigh and knee. 1. Lie on your side with your left / right leg in the top position. 2. Bend both of your knees and grab your left / right ankle. Stretch out your bottom arm to help you balance. 3. Slowly bring your top knee back so your thigh goes behind your trunk. 4. Slowly lower your top leg toward the floor until you feel a gentle stretch on the outside of your left / right hip and thigh. If you do not feel a stretch and your knee will not  fall farther, place the heel of your other foot on top of your knee and pull your knee down toward the floor with your foot. 5. Hold this position for __________ seconds. Repeat __________ times. Complete this exercise __________ times a day. Strengthening exercises These exercises build strength and endurance in your hip and pelvis. Endurance is the ability to use your muscles for a long time, even after they get tired. Straight leg raises, side-lying This exercise strengthens the muscles that rotate the leg at the hip and move it away from your body (hip abductors). 1. Lie on your side with your left / right leg in the top position. Lie so your head, shoulder, hip, and knee line up. You may bend your bottom knee to help you balance. 2. Roll your hips slightly forward so your hips are stacked directly over each other and your left / right knee is facing forward. 3. Tense the muscles in your outer thigh and  lift your top leg 4-6 inches (10-15 cm). 4. Hold this position for __________ seconds. 5. Slowly return to the starting position. Let your muscles relax completely before doing another repetition. Repeat __________ times. Complete this exercise __________ times a day. Leg raises, prone This exercise strengthens the muscles that move the hips (hip extensors). 1. Lie on your abdomen on your bed or a firm surface. You can put a pillow under your hips if that is more comfortable for your lower back. 2. Bend your left / right knee so your foot is straight up in the air. 3. Squeeze your buttocks muscles and lift your left / right thigh off the bed. Do not let your back arch. 4. Tense your thigh muscle as hard as you can without increasing any knee pain. 5. Hold this position for __________ seconds. 6. Slowly lower your leg to the starting position and allow it to relax completely. Repeat __________ times. Complete this exercise __________ times a day. Hip hike 1. Stand sideways on a bottom step.  Stand on your left / right leg with your other foot unsupported next to the step. You can hold on to the railing or wall for balance if needed. 2. Keep your knees straight and your torso square. Then lift your left / right hip up toward the ceiling. 3. Slowly let your left / right hip lower toward the floor, past the starting position. Your foot should get closer to the floor. Do not lean or bend your knees. Repeat __________ times. Complete this exercise __________ times a day. This information is not intended to replace advice given to you by your health care provider. Make sure you discuss any questions you have with your health care provider. Document Revised: 03/08/2019 Document Reviewed: 09/06/2018 Elsevier Patient Education  2020 [place]. Back Exercises The following exercises strengthen the muscles that help to support the trunk and back. They also help to keep the lower back flexible. Doing these exercises can help to prevent back pain or lessen existing pain.  If you have back pain or discomfort, try doing these exercises 2-3 times each day or as told by your health care provider.  As your pain improves, do them once each day, but increase the number of times that you repeat the steps for each exercise (do more repetitions).  To prevent the recurrence of back pain, continue to do these exercises once each day or as told by your health care provider. Do exercises exactly as told by your health care provider and adjust them as directed. It is normal to feel mild stretching, pulling, tightness, or discomfort as you do these exercises, but you should stop right away if you feel sudden pain or your pain gets worse. Exercises Single knee to chest Repeat these steps 3-5 times for each leg: 1. Lie on your back on a firm bed or the floor with your legs extended. 2. Bring one knee to your chest. Your other leg should stay extended and in contact with the floor. 3. Hold your knee in place  by grabbing your knee or thigh with both hands and hold. 4. Pull on your knee until you feel a gentle stretch in your lower back or buttocks. 5. Hold the stretch for 10-30 seconds. 6. Slowly release and straighten your leg. Pelvic tilt Repeat these steps 5-10 times: 1. Lie on your back on a firm bed or the floor with your legs extended. 2. Bend your knees so they are pointing toward the  ceiling and your feet are flat on the floor. 3. Tighten your lower abdominal muscles to press your lower back against the floor. This motion will tilt your pelvis so your tailbone points up toward the ceiling instead of pointing to your feet or the floor. 4. With gentle tension and even breathing, hold this position for 5-10 seconds. Cat-cow Repeat these steps until your lower back becomes more flexible: 1. Get into a hands-and-knees position on a firm surface. Keep your hands under your shoulders, and keep your knees under your hips. You may place padding under your knees for comfort. 2. Let your head hang down toward your chest. Contract your abdominal muscles and point your tailbone toward the floor so your lower back becomes rounded like the back of a cat. 3. Hold this position for 5 seconds. 4. Slowly lift your head, let your abdominal muscles relax and point your tailbone up toward the ceiling so your back forms a sagging arch like the back of a cow. 5. Hold this position for 5 seconds.  Press-ups Repeat these steps 5-10 times: 1. Lie on your abdomen (face-down) on the floor. 2. Place your palms near your head, about shoulder-width apart. 3. Keeping your back as relaxed as possible and keeping your hips on the floor, slowly straighten your arms to raise the top half of your body and lift your shoulders. Do not use your back muscles to raise your upper torso. You may adjust the placement of your hands to make yourself more comfortable. 4. Hold this position for 5 seconds while you keep your back  relaxed. 5. Slowly return to lying flat on the floor.  Bridges Repeat these steps 10 times: 1. Lie on your back on a firm surface. 2. Bend your knees so they are pointing toward the ceiling and your feet are flat on the floor. Your arms should be flat at your sides, next to your body. 3. Tighten your buttocks muscles and lift your buttocks off the floor until your waist is at almost the same height as your knees. You should feel the muscles working in your buttocks and the back of your thighs. If you do not feel these muscles, slide your feet 1-2 inches farther away from your buttocks. 4. Hold this position for 3-5 seconds. 5. Slowly lower your hips to the starting position, and allow your buttocks muscles to relax completely. If this exercise is too easy, try doing it with your arms crossed over your chest. Abdominal crunches Repeat these steps 5-10 times: 1. Lie on your back on a firm bed or the floor with your legs extended. 2. Bend your knees so they are pointing toward the ceiling and your feet are flat on the floor. 3. Cross your arms over your chest. 4. Tip your chin slightly toward your chest without bending your neck. 5. Tighten your abdominal muscles and slowly raise your trunk (torso) high enough to lift your shoulder blades a tiny bit off the floor. Avoid raising your torso higher than that because it can put too much stress on your low back and does not help to strengthen your abdominal muscles. 6. Slowly return to your starting position. Back lifts Repeat these steps 5-10 times: 1. Lie on your abdomen (face-down) with your arms at your sides, and rest your forehead on the floor. 2. Tighten the muscles in your legs and your buttocks. 3. Slowly lift your chest off the floor while you keep your hips pressed to the floor. Keep the  back of your head in line with the curve in your back. Your eyes should be looking at the floor. 4. Hold this position for 3-5 seconds. 5. Slowly return  to your starting position. Contact a health care provider if:  Your back pain or discomfort gets much worse when you do an exercise.  Your worsening back pain or discomfort does not lessen within 2 hours after you exercise. If you have any of these problems, stop doing these exercises right away. Do not do them again unless your health care provider says that you can. Get help right away if:  You develop sudden, severe back pain. If this happens, stop doing the exercises right away. Do not do them again unless your health care provider says that you can. This information is not intended to replace advice given to you by your health care provider. Make sure you discuss any questions you have with your health care provider. Document Revised: 03/22/2019 Document Reviewed: 08/17/2018 Elsevier Patient Education  [place].

## 2020-11-23 ENCOUNTER — Other Ambulatory Visit: Payer: Self-pay | Admitting: Gastroenterology

## 2020-11-26 ENCOUNTER — Telehealth: Payer: Self-pay | Admitting: Internal Medicine

## 2020-11-26 DIAGNOSIS — E034 Atrophy of thyroid (acquired): Secondary | ICD-10-CM

## 2020-11-26 DIAGNOSIS — E785 Hyperlipidemia, unspecified: Secondary | ICD-10-CM

## 2020-11-26 NOTE — Telephone Encounter (Signed)
Patient is requesting lab orders before her 1.26.22 OV. Please call her if orders are placed [phone].

## 2020-11-27 NOTE — Telephone Encounter (Signed)
Okay.  Done.  Thanks 

## 2020-12-01 NOTE — Telephone Encounter (Signed)
Pt has been notified.

## 2020-12-05 ENCOUNTER — Ambulatory Visit (INDEPENDENT_AMBULATORY_CARE_PROVIDER_SITE_OTHER): Payer: Medicare Other | Admitting: Psychology

## 2020-12-05 DIAGNOSIS — F411 Generalized anxiety disorder: Secondary | ICD-10-CM | POA: Diagnosis not present

## 2020-12-06 ENCOUNTER — Other Ambulatory Visit: Payer: Self-pay | Admitting: Gastroenterology

## 2020-12-18 DIAGNOSIS — R350 Frequency of micturition: Secondary | ICD-10-CM | POA: Diagnosis not present

## 2020-12-18 DIAGNOSIS — R3989 Other symptoms and signs involving the genitourinary system: Secondary | ICD-10-CM | POA: Diagnosis not present

## 2020-12-18 DIAGNOSIS — R3915 Urgency of urination: Secondary | ICD-10-CM | POA: Diagnosis not present

## 2020-12-19 ENCOUNTER — Other Ambulatory Visit (INDEPENDENT_AMBULATORY_CARE_PROVIDER_SITE_OTHER): Payer: Medicare Other

## 2020-12-19 DIAGNOSIS — E034 Atrophy of thyroid (acquired): Secondary | ICD-10-CM | POA: Diagnosis not present

## 2020-12-19 DIAGNOSIS — E785 Hyperlipidemia, unspecified: Secondary | ICD-10-CM

## 2020-12-19 LAB — TSH: TSH: 1.89 u[IU]/mL (ref 0.35–4.50)

## 2020-12-19 LAB — COMPREHENSIVE METABOLIC PANEL
ALT: 24 U/L (ref 0–35)
AST: 20 U/L (ref 0–37)
Albumin: 4.3 g/dL (ref 3.5–5.2)
Alkaline Phosphatase: 36 U/L — ABNORMAL LOW (ref 39–117)
BUN: 15 mg/dL (ref 6–23)
CO2: 30 mEq/L (ref 19–32)
Calcium: 9.3 mg/dL (ref 8.4–10.5)
Chloride: 101 mEq/L (ref 96–112)
Creatinine, Ser: 0.81 mg/dL (ref 0.40–1.20)
GFR: 67.58 mL/min (ref >60.00)
Glucose, Bld: 88 mg/dL (ref 70–99)
Potassium: 4.1 mEq/L (ref 3.5–5.1)
Sodium: 137 mEq/L (ref 135–145)
Total Bilirubin: 0.7 mg/dL (ref 0.2–1.2)
Total Protein: 7.3 g/dL (ref 6.0–8.3)

## 2020-12-19 LAB — T4, FREE: Free T4: 1.11 ng/dL (ref 0.60–1.60)

## 2020-12-23 ENCOUNTER — Other Ambulatory Visit: Payer: Self-pay

## 2020-12-24 ENCOUNTER — Ambulatory Visit (INDEPENDENT_AMBULATORY_CARE_PROVIDER_SITE_OTHER): Payer: Medicare Other | Admitting: Internal Medicine

## 2020-12-24 ENCOUNTER — Encounter: Payer: Self-pay | Admitting: Internal Medicine

## 2020-12-24 DIAGNOSIS — I10 Essential (primary) hypertension: Secondary | ICD-10-CM

## 2020-12-24 DIAGNOSIS — G8929 Other chronic pain: Secondary | ICD-10-CM | POA: Diagnosis not present

## 2020-12-24 DIAGNOSIS — M542 Cervicalgia: Secondary | ICD-10-CM | POA: Diagnosis not present

## 2020-12-24 DIAGNOSIS — E034 Atrophy of thyroid (acquired): Secondary | ICD-10-CM

## 2020-12-24 DIAGNOSIS — M545 Low back pain, unspecified: Secondary | ICD-10-CM | POA: Diagnosis not present

## 2020-12-24 NOTE — Progress Notes (Signed)
Elevated   Subjective:  Patient ID: [name], female    DOB: [birth year]  Age: 85 y.o. MRN: [id number]  CC: Follow-up (3 month f/u)   HPI [name] presents for neck pain - worse, using Tramadol. Pt saw [name] x2 F/u [facility], anxiety f/u  Outpatient Medications Prior to Visit  Medication Sig Dispense Refill  . Acetaminophen (TYLENOL PO) Take by mouth 3 (three) times daily.     [name] azelastine (ASTELIN) 0.1 % nasal spray Place into both nostrils at bedtime. Use in each nostril as directed    . Calcium Carbonate-Vitamin D (CALCIUM-VITAMIN D) 500-200 MG-UNIT per tablet Take 3 tablets by mouth daily.    . Cholecalciferol 4000 units TABS Take 1 tablet by mouth daily.    [name] desonide (DESOWEN) 0.05 % cream Apply topically 2 (two) times daily. (Patient taking differently: Apply topically 2 (two) times daily as needed.) 30 g 0  . diclofenac Sodium (VOLTAREN) 1 % GEL APPLY 4 GRAMS 4 TIMES DAILY. 200 g 3  . EPINEPHrine (EPIPEN 2-PAK) 0.3 mg/0.3 mL IJ SOAJ injection Inject 0.3 mLs (0.3 mg total) into the muscle as needed. 1 Device 1  . famotidine (PEPCID) 40 MG tablet TAKE ONE TABLET AT BEDTIME. 30 tablet 0  . ipratropium (ATROVENT) 0.06 % nasal spray Place 1 spray into the nose 3 (three) times daily as needed for rhinitis. 15 mL 0  . levocetirizine (XYZAL) 5 MG tablet Take 5 mg by mouth every evening.    [name] levothyroxine (SYNTHROID) 50 MCG tablet TAKE 1 TABLET ONCE DAILY BEFORE BREAKFAST. 30 tablet 5  . LORazepam (ATIVAN) 1 MG tablet TAKE 2-3 TABLETS AT BEDTIME AND 1-2 TABLETS IN THE DAYTIME AS NEEDED. 150 tablet 1  . montelukast (SINGULAIR) 10 MG tablet Take 1 tablet (10 mg total) by mouth at bedtime. 90 tablet 1  . NONFORMULARY OR COMPOUNDED ITEM Estradiol 0.02% vaginal cream S:Insert 48ml vaginally twice a week. 90 each 3  . nystatin-triamcinolone ointment (MYCOLOG) Apply 1 application topically 2 (two) times daily. (Patient taking differently: Apply 1 application topically 2 (two) times daily as  needed.) 30 g 1  . pantoprazole (PROTONIX) 40 MG tablet TAKE (1) TABLET TWICE A DAY BEFORE MEALS. 60 tablet 0  . SALINE NASAL SPRAY NA Place into the nose as needed.     . simvastatin (ZOCOR) 40 MG tablet TAKE 1 TABLET ONCE DAILY. 90 tablet 3  . traMADol (ULTRAM) 50 MG tablet Take 1 tablet (50 mg total) by mouth every 6 (six) hours as needed for moderate pain or severe pain. 120 tablet 3  . triamcinolone cream (KENALOG) 0.1 % Apply 1 application topically 3 (three) times daily as needed. 80 g 2  . VESICARE 10 MG tablet Take 1 tablet by mouth as needed.     [name] albuterol (PROVENTIL HFA;VENTOLIN HFA) 108 (90 BASE) MCG/ACT inhaler Inhale 2 puffs into the lungs every 6 (six) hours as needed for wheezing. (Patient not taking: Reported on 12/24/2020) 1 Inhaler 3   No facility-administered medications prior to visit.    ROS: Review of Systems  Constitutional: Positive for fatigue. Negative for activity change, appetite change, chills and unexpected weight change.  HENT: Negative for congestion, mouth sores and sinus pressure.   Eyes: Negative for visual disturbance.  Respiratory: Negative for cough and chest tightness.   Gastrointestinal: Negative for abdominal pain and nausea.  Genitourinary: Negative for difficulty urinating, frequency and vaginal pain.  Musculoskeletal: Positive for arthralgias, neck pain and neck stiffness. Negative for back  pain and gait problem.  Skin: Negative for pallor and rash.  Neurological: Negative for dizziness, tremors, weakness, numbness and headaches.  Psychiatric/Behavioral: Negative for confusion, sleep disturbance and suicidal ideas. The patient is nervous/anxious.     Objective:  BP 120/72 (BP Location: Left Arm)   Pulse 85   Temp 97.7 F (36.5 C) (Oral)   Ht 5' (1.524 m)   Wt 120 lb 12.8 oz (54.8 kg)   SpO2 95%   BMI 23.59 kg/m   BP Readings from Last 3 Encounters:  12/24/20 120/72  11/18/20 123/80  09/22/20 118/70    Wt Readings from Last 3  Encounters:  12/24/20 120 lb 12.8 oz (54.8 kg)  11/18/20 121 lb (54.9 kg)  09/22/20 119 lb 6.4 oz (54.2 kg)    Physical Exam Constitutional:      General: She is not in acute distress.    Appearance: She is well-developed.  HENT:     Head: Normocephalic.     Right Ear: External ear normal.     Left Ear: External ear normal.     Nose: Nose normal.     Mouth/Throat:     Mouth: Oropharynx is clear and moist.  Eyes:     General:        Right eye: No discharge.        Left eye: No discharge.     Conjunctiva/sclera: Conjunctivae normal.     Pupils: Pupils are equal, round, and reactive to light.  Neck:     Thyroid: No thyromegaly.     Vascular: No JVD.     Trachea: No tracheal deviation.  Cardiovascular:     Rate and Rhythm: Normal rate and regular rhythm.     Heart sounds: Normal heart sounds.  Pulmonary:     Effort: No respiratory distress.     Breath sounds: No stridor. No wheezing.  Abdominal:     General: Bowel sounds are normal. There is no distension.     Palpations: Abdomen is soft. There is no mass.     Tenderness: There is no abdominal tenderness. There is no guarding or rebound.  Musculoskeletal:        General: No tenderness or edema.     Cervical back: Normal range of motion and neck supple.  Lymphadenopathy:     Cervical: No cervical adenopathy.  Skin:    Findings: No erythema or rash.  Neurological:     Mental Status: She is oriented to person, place, and time.     Cranial Nerves: No cranial nerve deficit.     Motor: No abnormal muscle tone.     Coordination: Coordination normal.     Deep Tendon Reflexes: Reflexes normal.  Psychiatric:        Mood and Affect: Mood and affect normal.        Behavior: Behavior normal.        Thought Content: Thought content normal.        Judgment: Judgment normal.    LS tender Neck - stiff   Lab Results  Component Value Date   WBC 5.3 09/18/2020   HGB 12.6 09/18/2020   HCT 37.1 09/18/2020   PLT 267.0  09/18/2020   GLUCOSE 88 12/19/2020   CHOL 182 09/18/2020   TRIG 245.0 (H) 09/18/2020   HDL 53.70 09/18/2020   LDLDIRECT 93.0 09/18/2020   LDLCALC 90 12/26/2019   ALT 24 12/19/2020   AST 20 12/19/2020   NA 137 12/19/2020   K 4.1 12/19/2020  CL 101 12/19/2020   CREATININE 0.81 12/19/2020   BUN 15 12/19/2020   CO2 30 12/19/2020   TSH 1.89 12/19/2020   HGBA1C 5.5 06/13/2020    MM 3D SCREEN BREAST BILATERAL  Result Date: 11/08/2020 CLINICAL DATA:  Screening. EXAM: DIGITAL SCREENING BILATERAL MAMMOGRAM WITH TOMO AND CAD COMPARISON:  Previous exam(s). ACR Breast Density Category b: There are scattered areas of fibroglandular density. FINDINGS: There are no findings suspicious for malignancy. Images were processed with CAD. IMPRESSION: No mammographic evidence of malignancy. A result letter of this screening mammogram will be mailed directly to the patient. RECOMMENDATION: Screening mammogram in one year. (Code:SM-B-01Y) BI-RADS CATEGORY  1: Negative. Electronically Signed   By: Margarette Canada M.D.   On: 11/08/2020 10:11    Assessment & Plan:   Walker Kehr, MD

## 2020-12-24 NOTE — Assessment & Plan Note (Signed)
NAS diet 

## 2020-12-24 NOTE — Assessment & Plan Note (Signed)
Levothroid 

## 2020-12-24 NOTE — Assessment & Plan Note (Signed)
Tramadol as needed  Potential benefits of a long term opioids use as well as potential risks (i.e. addiction risk, apnea etc) and complications (i.e. Somnolence, constipation and others) were explained to the patient and were aknowledged. PT offered

## 2020-12-24 NOTE — Assessment & Plan Note (Signed)
Worse PT, traction, TENs unit was offered Tramadol prn  Potential benefits of a long term opioids use as well as potential risks (i.e. addiction risk, apnea etc) and complications (i.e. Somnolence, constipation and others) were explained to the patient and were aknowledged.

## 2021-01-03 ENCOUNTER — Other Ambulatory Visit: Payer: Self-pay | Admitting: Gastroenterology

## 2021-02-01 NOTE — Progress Notes (Signed)
CARDIOLOGY CLINIC NOTE  Patient ID: [name], female   DOB: Apr 03, 1936, 85 y.o.   MRN: [id number]  HPI:  [name] is a 85 year old woman with a history of hyperlipidemia, mild mitral valve prolapse with mild mitral regurgitation (echo 12/11).  She has undergone CT angiogram at the [facility] about 10 years ago which was totally normal.   She returns today for regular follow up. Doing fairly well. Not exercising as much. No CP or SOB. No edema. Gets tired faster.   Echo today 02/02/21: EF 60-65% G 1 DD AoV moderately calcified. Mild AS Mean gradient 7mm HG Mild AI. Personally reviewed   Echo 8/20 EF 60-65 grade I DD Mild AI. No significant AS   Echo 02/2017: EF 55%Trivial MVP Mild MR Calcified Aov Moves well. Mild AI. No AS Echo 02/2018: EF 60% Mild MR Calcified AoV Very mild AS mean gradient 4 Mild AI Grade I DD Personally reviewed   Lab Results  Component Value Date   CHOL 182 09/18/2020   HDL 53.70 09/18/2020   LDLCALC 90 12/26/2019   LDLDIRECT 93.0 09/18/2020   TRIG 245.0 (H) 09/18/2020   CHOLHDL 3 09/18/2020    ROS: All systems negative except as listed in HPI, PMH and Problem List.  Past Medical History:  Diagnosis Date  . Allergic rhinitis   . Atrophic vaginitis   . Baker's cyst    Left-Dr. Aluisio  . Cataract    Dr. Dione Booze  . Colon polyps   . Fibroid   . Gastritis    chronic  . GERD (gastroesophageal reflux disease)   . Heart murmur   . Hemorrhoids   . Hyperlipidemia   . IBS (irritable bowel syndrome)   . MVP (mitral valve prolapse)    Antibiotics required for dental procedures  . OA (osteoarthritis)   . Osteoporosis 06/2018   T score -2.2 stable on Prolia  . Overactive bladder   . Thyroid disease   . Thyroid nodule   . Torn meniscus    bilateral    Current Outpatient Medications  Medication Sig Dispense Refill  . Acetaminophen (TYLENOL PO) Take by mouth 3 (three) times daily.     [name] azelastine (ASTELIN) 0.1 % nasal spray Place into both nostrils at  bedtime. Use in each nostril as directed    . Calcium Carbonate-Vitamin D (CALCIUM-VITAMIN D) 500-200 MG-UNIT per tablet Take 3 tablets by mouth daily.    . Cholecalciferol 4000 units TABS Take 1 tablet by mouth daily.    [name] desonide (DESOWEN) 0.05 % cream Apply topically 2 (two) times daily. (Patient taking differently: Apply topically 2 (two) times daily as needed.) 30 g 0  . diclofenac Sodium (VOLTAREN) 1 % GEL APPLY 4 GRAMS 4 TIMES DAILY. 200 g 3  . EPINEPHrine (EPIPEN 2-PAK) 0.3 mg/0.3 mL IJ SOAJ injection Inject 0.3 mLs (0.3 mg total) into the muscle as needed. 1 Device 1  . famotidine (PEPCID) 40 MG tablet TAKE ONE TABLET AT BEDTIME. 30 tablet 0  . ipratropium (ATROVENT) 0.06 % nasal spray Place 1 spray into the nose 3 (three) times daily as needed for rhinitis. 15 mL 0  . levocetirizine (XYZAL) 5 MG tablet Take 5 mg by mouth every evening.    [name] levothyroxine (SYNTHROID) 50 MCG tablet TAKE 1 TABLET ONCE DAILY BEFORE BREAKFAST. 30 tablet 5  . LORazepam (ATIVAN) 1 MG tablet TAKE 2-3 TABLETS AT BEDTIME AND 1-2 TABLETS IN THE DAYTIME AS NEEDED. 150 tablet 1  . montelukast (SINGULAIR) 10 MG  tablet Take 1 tablet (10 mg total) by mouth at bedtime. 90 tablet 1  . NONFORMULARY OR COMPOUNDED ITEM Estradiol 0.02% vaginal cream S:Insert 1ml vaginally twice a week. 90 each 3  . nystatin-triamcinolone ointment (MYCOLOG) Apply 1 application topically 2 (two) times daily. (Patient taking differently: Apply 1 application topically 2 (two) times daily as needed.) 30 g 1  . pantoprazole (PROTONIX) 40 MG tablet TAKE (1) TABLET TWICE A DAY BEFORE MEALS. 60 tablet 0  . SALINE NASAL SPRAY NA Place into the nose as needed.     . simvastatin (ZOCOR) 40 MG tablet TAKE 1 TABLET ONCE DAILY. 90 tablet 3  . traMADol (ULTRAM) 50 MG tablet Take 1 tablet (50 mg total) by mouth every 6 (six) hours as needed for moderate pain or severe pain. 120 tablet 3  . triamcinolone cream (KENALOG) 0.1 % Apply 1 application topically 3  (three) times daily as needed. 80 g 2  . VESICARE 10 MG tablet Take 1 tablet by mouth as needed.      No current facility-administered medications for this encounter.    Filed Weights   02/02/21 1408  Weight: 55.6 kg (122 lb 9.6 oz)    PHYSICAL EXAM: Vitals:   02/02/21 1408  BP: 122/70  Pulse: 76  SpO2: 96%   General:  Well appearing. No resp difficulty HEENT: normal Neck: supple. no JVD. Carotids 2+ bilat; no bruits. No lymphadenopathy or thryomegaly appreciated. Cor: PMI nondisplaced. Regular rate & rhythm. No rubs, gallops or murmurs. Lungs: clear Abdomen: soft, nontender, nondistended. No hepatosplenomegaly. No bruits or masses. Good bowel sounds. Extremities: no cyanosis, clubbing, rash, edema Neuro: alert & orientedx3, cranial nerves grossly intact. moves all 4 extremities w/o difficulty. Affect pleasant  ASSESSMENT & PLAN:  1. Aortic valve thickening with mild AS/mild AI - Echo today 02/02/21 EF 60-65 grade I DD Mild AI. No significant AS   2. MVP, mild -  Mild MR on echo   3. Hyperlipidemia - Followed by PCP - Continue simvastatin 40 mg.    4. HTN - Blood pressure well controlled. Continue current regimen.   Arvilla Meres, MD 7:31 PM

## 2021-02-02 ENCOUNTER — Encounter (HOSPITAL_COMMUNITY): Payer: Self-pay | Admitting: Internal Medicine

## 2021-02-02 ENCOUNTER — Ambulatory Visit (HOSPITAL_COMMUNITY)
Admission: RE | Admit: 2021-02-02 | Discharge: 2021-02-02 | Disposition: A | Discharge status: Home / Self Care | Payer: Medicare Other | Source: Ambulatory Visit | Attending: Internal Medicine | Admitting: Internal Medicine

## 2021-02-02 ENCOUNTER — Other Ambulatory Visit: Payer: Self-pay

## 2021-02-02 ENCOUNTER — Ambulatory Visit (HOSPITAL_BASED_OUTPATIENT_CLINIC_OR_DEPARTMENT_OTHER)
Admission: RE | Admit: 2021-02-02 | Discharge: 2021-02-02 | Disposition: A | Discharge status: Home / Self Care | Payer: Medicare Other | Source: Ambulatory Visit | Attending: Internal Medicine | Admitting: Internal Medicine

## 2021-02-02 ENCOUNTER — Other Ambulatory Visit: Payer: Self-pay | Admitting: Gastroenterology

## 2021-02-02 VITALS — BP 122/70 | HR 76 | Wt 122.6 lb

## 2021-02-02 DIAGNOSIS — I10 Essential (primary) hypertension: Secondary | ICD-10-CM | POA: Insufficient documentation

## 2021-02-02 DIAGNOSIS — I350 Nonrheumatic aortic (valve) stenosis: Secondary | ICD-10-CM

## 2021-02-02 DIAGNOSIS — Z79890 Hormone replacement therapy (postmenopausal): Secondary | ICD-10-CM | POA: Insufficient documentation

## 2021-02-02 DIAGNOSIS — I080 Rheumatic disorders of both mitral and aortic valves: Secondary | ICD-10-CM | POA: Diagnosis not present

## 2021-02-02 DIAGNOSIS — Z791 Long term (current) use of non-steroidal anti-inflammatories (NSAID): Secondary | ICD-10-CM | POA: Diagnosis not present

## 2021-02-02 DIAGNOSIS — Z86010 Personal history of colonic polyps: Secondary | ICD-10-CM | POA: Diagnosis not present

## 2021-02-02 DIAGNOSIS — E785 Hyperlipidemia, unspecified: Secondary | ICD-10-CM | POA: Diagnosis not present

## 2021-02-02 DIAGNOSIS — Z8719 Personal history of other diseases of the digestive system: Secondary | ICD-10-CM | POA: Insufficient documentation

## 2021-02-02 DIAGNOSIS — Z79899 Other long term (current) drug therapy: Secondary | ICD-10-CM | POA: Insufficient documentation

## 2021-02-02 HISTORY — DX: Heart failure, unspecified: I50.9

## 2021-02-02 LAB — ECHOCARDIOGRAM COMPLETE
AR max vel: 1.39 cm2
AV Area VTI: 1.42 cm2
AV Area mean vel: 1.4 cm2
AV Mean grad: 7 mmHg
AV Peak grad: 14.4 mmHg
Ao pk vel: 1.9 m/s
Area-P 1/2: 2.91 cm2
Calc EF: 68.1 %
P 1/2 time: 404 msec
S' Lateral: 2.3 cm
Single Plane A2C EF: 68.6 %
Single Plane A4C EF: 68.6 %

## 2021-02-02 NOTE — Progress Notes (Signed)
  Echocardiogram 2D Echocardiogram has been performed.  [name] 02/02/2021, 1:59 PM

## 2021-02-02 NOTE — Patient Instructions (Signed)
It was great to see you today! No medication changes are needed at this time.   Your physician recommends that you schedule a follow-up appointment in: 12 months with Dr Haroldine Laws and echo  Your physician has requested that you have an echocardiogram. Echocardiography is a painless test that uses sound waves to create images of your heart. It provides your doctor with information about the size and shape of your heart and how well your heart's chambers and valves are working. This procedure takes approximately one hour. There are no restrictions for this procedure.   At the [facility], you and your health needs are our priority. As part of our continuing mission to provide you with exceptional heart care, we have created designated Provider Care Teams. These Care Teams include your primary Cardiologist (physician) and Advanced Practice Providers (APPs- Physician Assistants and Nurse Practitioners) who all work together to provide you with the care you need, when you need it.   You may see any of the following providers on your designated Care Team at your next follow up: [name] Dr Glori Bickers . Dr Loralie Champagne . Dr Vickki Muff . Darrick Grinder, NP . Lyda Jester, [place] . Audry Riles, PharmD   Please be sure to bring in all your medications bottles to every appointment.   If you have any questions or concerns before your next appointment please send [place] a message through [place] or call our office at [phone].    TO LEAVE A MESSAGE FOR THE NURSE SELECT OPTION 2, PLEASE LEAVE A MESSAGE INCLUDING: . YOUR NAME . DATE OF BIRTH . CALL BACK NUMBER . REASON FOR CALL**this is important as we prioritize the call backs  YOU WILL RECEIVE A CALL BACK THE SAME DAY AS LONG AS YOU CALL BEFORE 4:00 PM

## 2021-02-02 NOTE — Addendum Note (Signed)
Encounter addended by: Kerry Dory, CMA on: 02/02/2021 3:08 PM  Actions taken: Order list changed, Diagnosis association updated

## 2021-02-15 ENCOUNTER — Other Ambulatory Visit: Payer: Self-pay | Admitting: Gastroenterology

## 2021-02-16 ENCOUNTER — Other Ambulatory Visit: Payer: Self-pay | Admitting: Internal Medicine

## 2021-02-18 NOTE — Progress Notes (Signed)
Office Visit Note  Patient: [name]             Date of Birth: [birth year]           MRN: [id number]             PCP: Cassandria Anger, MD Referring: Cassandria Anger, MD Visit Date: 03/04/2021 Occupation: @GUAROCC @  Subjective:  Pain in multiple joints.   History of Present Illness: [name] is a 85 y.o. female with history of degenerative disc disease and osteoarthritis.  She states she has been having spine increased pain and discomfort in her neck and lower back.  She has been having increased pain at nighttime.  She states the pain from her hips goes into her lower extremities.  The hand pain is tolerable.  The cold weather increases the discomfort.  Activities of Daily Living:  Patient reports morning stiffness for 20 minutes.   Patient Reports nocturnal pain.  Difficulty dressing/grooming: Denies Difficulty climbing stairs: Denies Difficulty getting out of chair: Denies Difficulty using hands for taps, buttons, cutlery, and/or writing: Reports  Review of Systems  Constitutional: Positive for fatigue. Negative for night sweats, weight gain and weight loss.  HENT: Positive for mouth dryness. Negative for mouth sores, trouble swallowing, trouble swallowing and nose dryness.   Eyes: Negative for pain, redness, itching, visual disturbance and dryness.  Respiratory: Negative for cough, hemoptysis, shortness of breath and difficulty breathing.   Cardiovascular: Negative for chest pain, palpitations, hypertension, irregular heartbeat and swelling in legs/feet.  Gastrointestinal: Positive for constipation and diarrhea. Negative for abdominal pain and blood in stool.  Endocrine: Negative for increased urination.  Genitourinary: Negative for painful urination and vaginal dryness.  Musculoskeletal: Positive for arthralgias, joint pain, morning stiffness and muscle tenderness. Negative for joint swelling, myalgias, muscle weakness and myalgias.  Skin: Negative for color  change, rash, hair loss, redness, skin tightness, ulcers and sensitivity to sunlight.  Allergic/Immunologic: Negative for susceptible to infections.  Neurological: Positive for headaches. Negative for dizziness, numbness, memory loss, night sweats and weakness.  Hematological: Negative for swollen glands.  Psychiatric/Behavioral: Negative for depressed mood, confusion and sleep disturbance. The patient is not nervous/anxious.     PMFS History:  Patient Active Problem List   Diagnosis Date Noted  . Trochanteric bursitis of right hip 09/22/2020  . Arthralgia 03/18/2020  . RUQ pain 01/02/2020  . Cholelithiasis 09/09/2019  . Ear pain, bilateral 03/21/2019  . Neck pain 12/26/2018  . Acute pain of left shoulder 12/26/2018  . Anxiety 08/22/2018  . Contact dermatitis and eczema 06/14/2018  . Other constipation 11/11/2016  . Low back pain 11/10/2016  . Leg abrasion 08/12/2016  . Contusion of right knee 08/12/2016  . Sinusitis, chronic 08/06/2016  . Rash and nonspecific skin eruption 04/20/2016  . Aortic valve sclerosis 03/12/2015  . Contusion of left chest wall 01/09/2015  . Fatigue 09/08/2014  . Postherpetic neuralgia 10/02/2013  . Impacted cerumen of right ear 07/16/2013  . Headache 07/10/2013  . Aortic stenosis 04/19/2013  . Insomnia 01/24/2013  . Cough 08/07/2012  . URI (upper respiratory infection) 08/03/2012  . Pruritus of skin 03/11/2012  . MVP (mitral valve prolapse)   . Multiple thyroid nodules 10/04/2011  . Herpes zoster 09/07/2011  . Essential hypertension, benign 07/27/2011  . Atrophic vaginitis   . Well adult exam 03/15/2011  . Hyperlipemia, mixed 03/15/2011  . OVERACTIVE BLADDER 09/02/2010  . Hypothyroidism 03/03/2010  . CALF PAIN, LEFT 10/27/2009  . Belching 07/21/2009  . EAR PAIN  12/19/2008  . Allergic rhinitis 12/19/2008  . Irritable bowel syndrome 09/04/2008  . PERSONAL HX COLONIC POLYPS 09/04/2008  . GASTRITIS, CHRONIC 09/03/2008  . DUODENITIS, WITHOUT  HEMORRHAGE 09/03/2008  . TIBIALIS TENDINITIS 03/20/2008  . OSTEOARTHRITIS 02/25/2008  . SWEATING 02/21/2008  . LACTOSE INTOLERANCE 11/20/2007  . Dyslipidemia 11/20/2007  . GERD 11/20/2007  . Osteoporosis 11/20/2007    Past Medical History:  Diagnosis Date  . Allergic rhinitis   . Atrophic vaginitis   . Baker's cyst    Left-Dr. Aluisio  . Cataract    Dr. Katy Fitch  . CHF (congestive heart failure) ([place])   . Colon polyps   . Fibroid   . Gastritis    chronic  . GERD (gastroesophageal reflux disease)   . Heart murmur   . Hemorrhoids   . Hyperlipidemia   . IBS (irritable bowel syndrome)   . MVP (mitral valve prolapse)    Antibiotics required for dental procedures  . OA (osteoarthritis)   . Osteoporosis 06/2018   T score -2.2 stable on Prolia  . Overactive bladder   . Thyroid disease   . Thyroid nodule   . Torn meniscus    bilateral    Family History  Problem Relation Age of Onset  . Heart disease Mother   . Hypertension Mother   . Osteoporosis Mother   . Congestive Heart Failure Mother   . Pancreatic cancer Father   . Heart attack Brother   . Drug abuse Brother        over dose   . Healthy Son   . Healthy Son   . Colon cancer Neg Hx   . Colon polyps Neg Hx   . Rectal cancer Neg Hx   . Stomach cancer Neg Hx    Past Surgical History:  Procedure Laterality Date  . CATARACT EXTRACTION, BILATERAL    . TONSILLECTOMY     Social History   Social History Narrative   Regular exercise-yes   Daily caffeine use   Immunization History  Administered Date(s) Administered  . Fluad Quad(high Dose 65+) 08/04/2019  . Influenza Whole 09/29/2007, 08/21/2008, 09/02/2010, 07/30/2012  . Influenza, High Dose Seasonal PF 09/03/2013, 02/19/2015, 11/12/2015, 08/06/2016, 11/11/2016, 08/20/2017, 08/31/2017, 09/20/2018, 09/12/2019  . Influenza,inj,Quad PF,6+ Mos 12/17/2014, 07/30/2015  . PFIZER(Purple Top)SARS-COV-2 Vaccination 12/12/2019, 12/31/2019, 07/29/2020, 07/29/2020  .  Pneumococcal Conjugate-13 01/09/2014  . Pneumococcal Polysaccharide-23 09/26/2006, 08/12/2015, 11/11/2016, 08/31/2017, 09/10/2020  . Td 04/14/2010  . Tdap 06/18/2020  . Typhoid Inactivated 02/23/2013  . Zoster 12/09/2006     Objective: Vital Signs: BP 104/69 (BP Location: Left Arm, Patient Position: Sitting, Cuff Size: Normal)   Pulse 69   Ht 5' (1.524 m)   Wt 129 lb (58.5 kg)   BMI 25.19 kg/m    Physical Exam Vitals and nursing note reviewed.  Constitutional:      Appearance: She is well-developed.  HENT:     Head: Normocephalic and atraumatic.  Eyes:     Conjunctiva/sclera: Conjunctivae normal.  Cardiovascular:     Rate and Rhythm: Normal rate and regular rhythm.     Heart sounds: Normal heart sounds.  Pulmonary:     Effort: Pulmonary effort is normal.     Breath sounds: Normal breath sounds.  Abdominal:     General: Bowel sounds are normal.     Palpations: Abdomen is soft.  Musculoskeletal:     Cervical back: Normal range of motion.  Lymphadenopathy:     Cervical: No cervical adenopathy.  Skin:    General: Skin is  warm and dry.     Capillary Refill: Capillary refill takes less than 2 seconds.  Neurological:     Mental Status: She is alert and oriented to person, place, and time.  Psychiatric:        Behavior: Behavior normal.      Musculoskeletal Exam: She had limited painful range of motion of her cervical spine.  She had bilateral trapezius spasm.  She had no thoracic tenderness.  She had discomfort in the lower lumbar region.  She tenderness over SI joints.  She had tenderness over bilateral trochanteric area.  She had tenderness over IT band.  Shoulder joints and elbow joints in good range of motion.  She had bilateral PIP and DIP thickening with no synovitis.  Hip joints and knee joints with good range of motion.  She had no tenderness over ankle joints.  She had bilateral hammertoes.  CDAI Exam: CDAI Score: -- Patient Global: --; Provider Global:  -- Swollen: --; Tender: -- Joint Exam 03/04/2021   No joint exam has been documented for this visit   There is currently no information documented on the homunculus. Go to the Rheumatology activity and complete the homunculus joint exam.  Investigation: No additional findings.  Imaging: ECHOCARDIOGRAM COMPLETE  Result Date: 02/02/2021    ECHOCARDIOGRAM REPORT   Patient Name:   NARDOS PUTNAM Date of Exam: 02/02/2021 Medical Rec #:  [id number]   Height:       60.0 in Accession #:    [id number]  Weight:       120.8 lb Date of Birth:  December 03, 1935    BSA:          1.507 m Patient Age:    42 years    BP:           129/75 mmHg Patient Gender: F           HR:           80 bpm. Exam Location:  Outpatient Procedure: 2D Echo, 3D Echo, Cardiac Doppler, Color Doppler and Strain Analysis Indications:    I35.1 Nonrheumatic aortic (valve) insufficiency; I35.0                 Nonrheumatic aortic (valve) stenosis; I34.1 Nonrheumatic mitral                 (valve) prolapse  History:        Patient has prior history of Echocardiogram examinations, most                 recent 07/09/2019. Aortic Valve Disease, Mitral Valve Disease and                 Mitral Valve Prolapse; Risk Factors:Hypertension and                 Dyslipidemia.  Sonographer:    Roseanna Rainbow Referring Phys: [id number] DANIEL R BENSIMHON  Sonographer Comments: Global longitudinal strain was attempted. IMPRESSIONS  1. Left ventricular ejection fraction, by estimation, is 60 to 65%. The left ventricle has normal function. The left ventricle has no regional wall motion abnormalities. Left ventricular diastolic parameters are consistent with Grade I diastolic dysfunction (impaired relaxation).  2. Right ventricular systolic function is normal. The right ventricular size is normal. There is normal pulmonary artery systolic pressure.  3. The mitral valve is normal in structure. Mild mitral valve regurgitation. No evidence of mitral stenosis.  4. The aortic valve is tricuspid.  There is moderate calcification of the aortic  valve. Aortic valve regurgitation is mild. Mild aortic valve stenosis.  5. The inferior vena cava is normal in size with greater than 50% respiratory variability, suggesting right atrial pressure of 3 mmHg. FINDINGS  Left Ventricle: Left ventricular ejection fraction, by estimation, is 60 to 65%. The left ventricle has normal function. The left ventricle has no regional wall motion abnormalities. The left ventricular internal cavity size was normal in size. There is  no left ventricular hypertrophy. Left ventricular diastolic parameters are consistent with Grade I diastolic dysfunction (impaired relaxation). Right Ventricle: The right ventricular size is normal. No increase in right ventricular wall thickness. Right ventricular systolic function is normal. There is normal pulmonary artery systolic pressure. The tricuspid regurgitant velocity is 2.47 m/s, and  with an assumed right atrial pressure of 3 mmHg, the estimated right ventricular systolic pressure is 42.5 mmHg. Left Atrium: Left atrial size was normal in size. Right Atrium: Right atrial size was normal in size. Pericardium: There is no evidence of pericardial effusion. Mitral Valve: The mitral valve is normal in structure. Mild mitral valve regurgitation. No evidence of mitral valve stenosis. Tricuspid Valve: The tricuspid valve is normal in structure. Tricuspid valve regurgitation is trivial. No evidence of tricuspid stenosis. Aortic Valve: The aortic valve is tricuspid. There is moderate calcification of the aortic valve. Aortic valve regurgitation is mild. Aortic regurgitation PHT measures 404 msec. Mild aortic stenosis is present. Aortic valve mean gradient measures 7.0 mmHg. Aortic valve peak gradient measures 14.4 mmHg. Aortic valve area, by VTI measures 1.42 cm. Pulmonic Valve: The pulmonic valve was grossly normal. Pulmonic valve regurgitation is trivial. No evidence of pulmonic stenosis. Aorta: The  aortic root is normal in size and structure. Venous: The inferior vena cava is normal in size with greater than 50% respiratory variability, suggesting right atrial pressure of 3 mmHg. IAS/Shunts: No atrial level shunt detected by color flow Doppler.  LEFT VENTRICLE PLAX 2D LVIDd:         3.90 cm     Diastology LVIDs:         2.30 cm     LV e' medial:    5.44 cm/s LV PW:         1.20 cm     LV E/e' medial:  13.2 LV IVS:        0.80 cm     LV e' lateral:   9.25 cm/s LVOT diam:     1.80 cm     LV E/e' lateral: 7.8 LV SV:         53 LV SV Index:   35 LVOT Area:     2.54 cm  LV Volumes (MOD) LV vol d, MOD A2C: 52.8 ml LV vol d, MOD A4C: 65.3 ml LV vol s, MOD A2C: 16.6 ml LV vol s, MOD A4C: 20.5 ml LV SV MOD A2C:     36.2 ml LV SV MOD A4C:     65.3 ml LV SV MOD BP:      40.6 ml RIGHT VENTRICLE             IVC RV S prime:     10.70 cm/s  IVC diam: 1.40 cm TAPSE (M-mode): 2.6 cm LEFT ATRIUM           Index       RIGHT ATRIUM           Index LA diam:      2.90 cm 1.92 cm/m  RA Area:     14.90  cm LA Vol (A2C): 35.0 ml 23.23 ml/m RA Volume:   35.40 ml  23.50 ml/m LA Vol (A4C): 13.9 ml 9.23 ml/m  AORTIC VALVE AV Area (Vmax):    1.39 cm AV Area (Vmean):   1.40 cm AV Area (VTI):     1.42 cm AV Vmax:           190.00 cm/s AV Vmean:          119.000 cm/s AV VTI:            0.372 m AV Peak Grad:      14.4 mmHg AV Mean Grad:      7.0 mmHg LVOT Vmax:         104.00 cm/s LVOT Vmean:        65.400 cm/s LVOT VTI:          0.207 m LVOT/AV VTI ratio: 0.56 AI PHT:            404 msec  AORTA Ao Root diam: 2.90 cm Ao Asc diam:  2.90 cm MITRAL VALVE               TRICUSPID VALVE MV Area (PHT): 2.91 cm    TR Peak grad:   24.4 mmHg MV Decel Time: 261 msec    TR Vmax:        247.00 cm/s MV E velocity: 72.00 cm/s MV A velocity: 87.80 cm/s  SHUNTS MV E/A ratio:  0.82        Systemic VTI:  0.21 m                            Systemic Diam: 1.80 cm Glori Bickers MD Electronically signed by Glori Bickers MD Signature Date/Time:  02/02/2021/2:46:07 PM    Final     Recent Labs: Lab Results  Component Value Date   WBC 5.3 09/18/2020   HGB 12.6 09/18/2020   PLT 267.0 09/18/2020   NA 137 12/19/2020   K 4.1 12/19/2020   CL 101 12/19/2020   CO2 30 12/19/2020   GLUCOSE 88 12/19/2020   BUN 15 12/19/2020   CREATININE 0.81 12/19/2020   BILITOT 0.7 12/19/2020   ALKPHOS 36 (L) 12/19/2020   AST 20 12/19/2020   ALT 24 12/19/2020   PROT 7.3 12/19/2020   ALBUMIN 4.3 12/19/2020   CALCIUM 9.3 12/19/2020   GFRAA 87 07/17/2020    Speciality Comments: No specialty comments available.  Procedures:  Large Joint Inj: bilateral greater trochanter on 03/04/2021 11:33 AM Indications: pain Details: 27 G 1.5 in needle, lateral approach  Arthrogram: No  Medications (Right): 1.5 mL lidocaine 1 %; 40 mg triamcinolone acetonide 40 MG/ML Aspirate (Right): 0 mL Medications (Left): 1.5 mL lidocaine 1 %; 40 mg triamcinolone acetonide 40 MG/ML Aspirate (Left): 0 mL Outcome: tolerated well, no immediate complications Procedure, treatment alternatives, risks and benefits explained, specific risks discussed. Consent was given by the patient. Immediately prior to procedure a time out was called to verify the correct patient, procedure, equipment, support staff and site/side marked as required. Patient was prepped and draped in the usual sterile fashion.     Allergies: Bee venom, Clarithromycin, Doxycycline, Neosporin [neomycin-bacitracin zn-polymyx], and Hydrocortisone   Assessment / Plan:     Visit Diagnoses: DDD (degenerative disc disease), cervical - Evaluated by Dr[facility]Chu at [facility].  She is followed by Dr. Vertell Limber now.  She has been having increased neck pain.  She will schedule appointment with Dr. Vertell Limber.  C1-C2 instability - MRI consistent with degenerative disc disease, facet joint arthropathy, spinal stenosis and fluid collection at craniocervical junction.    DDD (degenerative disc disease), lumbar - She was referred to physical  therapy at [facility].  Patient decided not to do the physical therapy.  She continues to have lower back pain.  She does not want to go for physical therapy.  Chronic SI joint pain-she continues to have SI joint pain.  Trochanteric bursitis of bilateral hips-she has been having nocturnal pain.  She has difficulty sleeping on her side.  Different treatment options were discussed.  She does not want to try physical therapy.  Have given her a handout on IT band exercises.  She is interested in getting cortisone injection.  Indications, side effects and contraindications of cortisone were discussed.  After informed consent was obtained bilateral trochanteric bursae were injected as described above.  She tolerated the procedure well.  Postprocedure instructions were given.  Primary osteoarthritis of both hands - Dr. Sherrian Divers performed ultrasound of bilateral hands which was negative for synovitis.  All autoimmune work-up was negative.  Primary osteoarthritis of both feet-she has severe hammertoes.  Proper fitting shoes were discussed.  Age-related osteoporosis without current pathological fracture - She is on Prolia by her GYN  Essential hypertension, benign-her blood pressure is normal today.  MVP (mitral valve prolapse)  Hx of colonic polyps  Aortic valve sclerosis  History of IBS  History of gastroesophageal reflux (GERD)  History of hyperlipidemia  Multiple thyroid nodules  Hypothyroidism due to acquired atrophy of thyroid  Calculus of gallbladder without cholecystitis without obstruction  Orders: Orders Placed This Encounter  Procedures  . Large Joint Inj   No orders of the defined types were placed in this encounter.  .  Follow-Up Instructions: Return if symptoms worsen or fail to improve, for DDD. OA.   Bo Merino, MD  Note - This record has been created using Editor, commissioning.  Chart creation errors have been sought, but may not always  have been located. Such  creation errors do not reflect on  the standard of medical care.

## 2021-02-20 ENCOUNTER — Other Ambulatory Visit: Payer: Self-pay | Admitting: *Deleted

## 2021-02-20 MED ORDER — LEVOTHYROXINE SODIUM 50 MCG PO TABS: ORAL_TABLET | ORAL | 5 refills | Status: DC

## 2021-02-25 NOTE — Telephone Encounter (Signed)
PROLIA GIVEN 10/29/2020  NEXT INJECTION 04/30/2021

## 2021-02-26 ENCOUNTER — Other Ambulatory Visit: Payer: Self-pay | Admitting: Internal Medicine

## 2021-03-02 ENCOUNTER — Other Ambulatory Visit: Payer: Self-pay | Admitting: Gastroenterology

## 2021-03-04 ENCOUNTER — Ambulatory Visit (INDEPENDENT_AMBULATORY_CARE_PROVIDER_SITE_OTHER): Payer: Medicare Other | Admitting: Rheumatology

## 2021-03-04 ENCOUNTER — Other Ambulatory Visit: Payer: Self-pay

## 2021-03-04 ENCOUNTER — Telehealth: Payer: Self-pay

## 2021-03-04 ENCOUNTER — Encounter: Payer: Self-pay | Admitting: Rheumatology

## 2021-03-04 VITALS — BP 104/69 | HR 69 | Ht 60.0 in | Wt 129.0 lb

## 2021-03-04 DIAGNOSIS — M19071 Primary osteoarthritis, right ankle and foot: Secondary | ICD-10-CM | POA: Diagnosis not present

## 2021-03-04 DIAGNOSIS — M19041 Primary osteoarthritis, right hand: Secondary | ICD-10-CM

## 2021-03-04 DIAGNOSIS — M19042 Primary osteoarthritis, left hand: Secondary | ICD-10-CM

## 2021-03-04 DIAGNOSIS — Z8719 Personal history of other diseases of the digestive system: Secondary | ICD-10-CM

## 2021-03-04 DIAGNOSIS — E042 Nontoxic multinodular goiter: Secondary | ICD-10-CM

## 2021-03-04 DIAGNOSIS — M7062 Trochanteric bursitis, left hip: Secondary | ICD-10-CM

## 2021-03-04 DIAGNOSIS — M19072 Primary osteoarthritis, left ankle and foot: Secondary | ICD-10-CM

## 2021-03-04 DIAGNOSIS — M532X1 Spinal instabilities, occipito-atlanto-axial region: Secondary | ICD-10-CM

## 2021-03-04 DIAGNOSIS — Z860100 Personal history of colon polyps, unspecified: Secondary | ICD-10-CM

## 2021-03-04 DIAGNOSIS — G8929 Other chronic pain: Secondary | ICD-10-CM

## 2021-03-04 DIAGNOSIS — M5030 Other cervical disc degeneration, unspecified cervical region: Secondary | ICD-10-CM

## 2021-03-04 DIAGNOSIS — M533 Sacrococcygeal disorders, not elsewhere classified: Secondary | ICD-10-CM

## 2021-03-04 DIAGNOSIS — M722 Plantar fascial fibromatosis: Secondary | ICD-10-CM

## 2021-03-04 DIAGNOSIS — M5136 Other intervertebral disc degeneration, lumbar region: Secondary | ICD-10-CM | POA: Diagnosis not present

## 2021-03-04 DIAGNOSIS — I358 Other nonrheumatic aortic valve disorders: Secondary | ICD-10-CM | POA: Diagnosis not present

## 2021-03-04 DIAGNOSIS — I10 Essential (primary) hypertension: Secondary | ICD-10-CM

## 2021-03-04 DIAGNOSIS — M51369 Other intervertebral disc degeneration, lumbar region without mention of lumbar back pain or lower extremity pain: Secondary | ICD-10-CM

## 2021-03-04 DIAGNOSIS — M810 Age-related osteoporosis without current pathological fracture: Secondary | ICD-10-CM

## 2021-03-04 DIAGNOSIS — Z8639 Personal history of other endocrine, nutritional and metabolic disease: Secondary | ICD-10-CM

## 2021-03-04 DIAGNOSIS — I341 Nonrheumatic mitral (valve) prolapse: Secondary | ICD-10-CM | POA: Diagnosis not present

## 2021-03-04 DIAGNOSIS — E034 Atrophy of thyroid (acquired): Secondary | ICD-10-CM

## 2021-03-04 DIAGNOSIS — K8020 Calculus of gallbladder without cholecystitis without obstruction: Secondary | ICD-10-CM

## 2021-03-04 DIAGNOSIS — M7061 Trochanteric bursitis, right hip: Secondary | ICD-10-CM | POA: Diagnosis not present

## 2021-03-04 MED ORDER — TRIAMCINOLONE ACETONIDE 40 MG/ML IJ SUSP
40.0000 mg | INTRAMUSCULAR | Status: AC | PRN
  Administered 2021-03-04: 40 mg via INTRA_ARTICULAR

## 2021-03-04 MED ORDER — LIDOCAINE HCL 1 % IJ SOLN
1.5000 mL | INTRAMUSCULAR | Status: AC | PRN
  Administered 2021-03-04: 1.5 mL

## 2021-03-04 NOTE — Telephone Encounter (Signed)
Patient requested a return call to let her know the name of the medication that was in the injection she received at her appointment this morning.

## 2021-03-04 NOTE — Patient Instructions (Signed)
Iliotibial Band Syndrome Rehab Ask your health care provider which exercises are safe for you. Do exercises exactly as told by your health care provider and adjust them as directed. It is normal to feel mild stretching, pulling, tightness, or discomfort as you do these exercises. Stop right away if you feel sudden pain or your pain gets significantly worse. Do not begin these exercises until told by your health care provider. Stretching and range-of-motion exercises These exercises warm up your muscles and joints and improve the movement and flexibility of your hip and pelvis. Quadriceps stretch, prone 1. Lie on your abdomen (prone position) on a firm surface, such as a bed or padded floor. 2. Bend your left / right knee and reach back to hold your ankle or pant leg. If you cannot reach your ankle or pant leg, loop a belt around your foot and grab the belt instead. 3. Gently pull your heel toward your buttocks. Your knee should not slide out to the side. You should feel a stretch in the front of your thigh and knee (quadriceps). 4. Hold this position for __________ seconds. Repeat __________ times. Complete this exercise __________ times a day.   Iliotibial band stretch An iliotibial band is a strong band of muscle tissue that runs from the outer side of your hip to the outer side of your thigh and knee. 1. Lie on your side with your left / right leg in the top position. 2. Bend both of your knees and grab your left / right ankle. Stretch out your bottom arm to help you balance. 3. Slowly bring your top knee back so your thigh goes behind your trunk. 4. Slowly lower your top leg toward the floor until you feel a gentle stretch on the outside of your left / right hip and thigh. If you do not feel a stretch and your knee will not fall farther, place the heel of your other foot on top of your knee and pull your knee down toward the floor with your foot. 5. Hold this position for __________  seconds. Repeat __________ times. Complete this exercise __________ times a day.   Strengthening exercises These exercises build strength and endurance in your hip and pelvis. Endurance is the ability to use your muscles for a long time, even after they get tired. Straight leg raises, side-lying This exercise strengthens the muscles that rotate the leg at the hip and move it away from your body (hip abductors). 1. Lie on your side with your left / right leg in the top position. Lie so your head, shoulder, hip, and knee line up. You may bend your bottom knee to help you balance. 2. Roll your hips slightly forward so your hips are stacked directly over each other and your left / right knee is facing forward. 3. Tense the muscles in your outer thigh and lift your top leg 4-6 inches (10-15 cm). 4. Hold this position for __________ seconds. 5. Slowly lower your leg to return to the starting position. Let your muscles relax completely before doing another repetition. Repeat __________ times. Complete this exercise __________ times a day.   Leg raises, prone This exercise strengthens the muscles that move the hips backward (hip extensors). 1. Lie on your abdomen (prone position) on your bed or a firm surface. You can put a pillow under your hips if that is more comfortable for your lower back. 2. Bend your left / right knee so your foot is straight up in the air.   3. Squeeze your buttocks muscles and lift your left / right thigh off the bed. Do not let your back arch. 4. Tense your thigh muscle as hard as you can without increasing any knee pain. 5. Hold this position for __________ seconds. 6. Slowly lower your leg to return to the starting position and allow it to relax completely. Repeat __________ times. Complete this exercise __________ times a day. Hip hike 1. Stand sideways on a bottom step. Stand on your left / right leg with your other foot unsupported next to the step. You can hold on to a  railing or wall for balance if needed. 2. Keep your knees straight and your torso square. Then lift your left / right hip up toward the ceiling. 3. Slowly let your left / right hip lower toward the floor, past the starting position. Your foot should get closer to the floor. Do not lean or bend your knees. Repeat __________ times. Complete this exercise __________ times a day. This information is not intended to replace advice given to you by your health care provider. Make sure you discuss any questions you have with your health care provider. Document Revised: 01/23/2020 Document Reviewed: 01/23/2020 Elsevier Patient Education  2021 [employer]  

## 2021-03-04 NOTE — Telephone Encounter (Signed)
I called patient, I highlighted medication on her office visit that was printed for her when she left.

## 2021-03-11 DIAGNOSIS — M5030 Other cervical disc degeneration, unspecified cervical region: Secondary | ICD-10-CM | POA: Diagnosis not present

## 2021-03-11 DIAGNOSIS — M069 Rheumatoid arthritis, unspecified: Secondary | ICD-10-CM | POA: Diagnosis not present

## 2021-03-11 DIAGNOSIS — R030 Elevated blood-pressure reading, without diagnosis of hypertension: Secondary | ICD-10-CM | POA: Diagnosis not present

## 2021-03-11 DIAGNOSIS — M4312 Spondylolisthesis, cervical region: Secondary | ICD-10-CM | POA: Insufficient documentation

## 2021-03-11 DIAGNOSIS — M542 Cervicalgia: Secondary | ICD-10-CM | POA: Diagnosis not present

## 2021-03-11 HISTORY — DX: Other cervical disc degeneration, unspecified cervical region: M50.30

## 2021-03-11 HISTORY — DX: Spondylolisthesis, cervical region: M43.12

## 2021-03-12 ENCOUNTER — Encounter: Payer: Self-pay | Admitting: Podiatry

## 2021-03-12 ENCOUNTER — Other Ambulatory Visit: Payer: Self-pay

## 2021-03-12 ENCOUNTER — Ambulatory Visit (INDEPENDENT_AMBULATORY_CARE_PROVIDER_SITE_OTHER): Payer: Medicare Other | Admitting: Podiatry

## 2021-03-12 DIAGNOSIS — M779 Enthesopathy, unspecified: Secondary | ICD-10-CM

## 2021-03-12 DIAGNOSIS — M2042 Other hammer toe(s) (acquired), left foot: Secondary | ICD-10-CM | POA: Diagnosis not present

## 2021-03-12 DIAGNOSIS — M21619 Bunion of unspecified foot: Secondary | ICD-10-CM | POA: Diagnosis not present

## 2021-03-16 NOTE — Progress Notes (Signed)
Subjective:   Patient ID: [name], female   DOB: 85 y.o.   MRN: [id number]   HPI Patient presents with severe digital deformities severe structural bunion deformity elevation of the second toe pain with lesion formation and states it is a bit very tender   ROS      Objective:  Physical Exam  Neurovascular status intact with significant structural malalignment of the left forefoot with large bunion formation and continued upward movement of the second digit with probable flexor plate dislocation with lesion formation     Assessment:  Lesion and structural deformity which is causing the discomfort she continues to experience     Plan:  H&P reviewed x-ray that could be done and reviewed possible surgical intervention or digital amputation which could be done in future.  At this point debridement accomplished along with padding and this will be done on an as-needed basis.  Spent a great deal of time going over other possible scenarios and future and shoe gear modifications to make

## 2021-03-17 DIAGNOSIS — Z23 Encounter for immunization: Secondary | ICD-10-CM | POA: Diagnosis not present

## 2021-03-18 ENCOUNTER — Telehealth: Payer: Self-pay | Admitting: Internal Medicine

## 2021-03-18 DIAGNOSIS — Z20822 Contact with and (suspected) exposure to covid-19: Secondary | ICD-10-CM

## 2021-03-18 NOTE — Telephone Encounter (Signed)
Patient called and was wondering if her Covid 19 antibodies could be checked at her lab appt on 03-23-21. Please advise

## 2021-03-22 NOTE — Telephone Encounter (Signed)
We could test them, however, CDC does recommend this test to guide COVID-19 vaccination decision making process. Thanks 

## 2021-03-23 ENCOUNTER — Other Ambulatory Visit: Payer: Medicare Other

## 2021-03-23 ENCOUNTER — Other Ambulatory Visit (INDEPENDENT_AMBULATORY_CARE_PROVIDER_SITE_OTHER): Payer: Medicare Other

## 2021-03-23 ENCOUNTER — Telehealth: Payer: Self-pay | Admitting: Emergency Medicine

## 2021-03-23 DIAGNOSIS — Z20822 Contact with and (suspected) exposure to covid-19: Secondary | ICD-10-CM | POA: Diagnosis not present

## 2021-03-23 DIAGNOSIS — E034 Atrophy of thyroid (acquired): Secondary | ICD-10-CM

## 2021-03-23 LAB — COMPREHENSIVE METABOLIC PANEL
ALT: 34 U/L (ref 0–35)
AST: 21 U/L (ref 0–37)
Albumin: 3.7 g/dL (ref 3.5–5.2)
Alkaline Phosphatase: 44 U/L (ref 39–117)
BUN: 25 mg/dL — ABNORMAL HIGH (ref 6–23)
CO2: 25 mEq/L (ref 19–32)
Calcium: 8.9 mg/dL (ref 8.4–10.5)
Chloride: 102 mEq/L (ref 96–112)
Creatinine, Ser: 0.81 mg/dL (ref 0.40–1.20)
GFR: 67.46 mL/min (ref >60.00)
Glucose, Bld: 94 mg/dL (ref 70–99)
Potassium: 4.1 mEq/L (ref 3.5–5.1)
Sodium: 137 mEq/L (ref 135–145)
Total Bilirubin: 0.4 mg/dL (ref 0.2–1.2)
Total Protein: 6.9 g/dL (ref 6.0–8.3)

## 2021-03-23 LAB — LIPID PANEL
Cholesterol: 200 mg/dL (ref 0–200)
HDL: 54.6 mg/dL (ref >39.00)
LDL Cholesterol: 108 mg/dL — ABNORMAL HIGH (ref 0–99)
NonHDL: 145
Total CHOL/HDL Ratio: 4
Triglycerides: 187 mg/dL — ABNORMAL HIGH (ref 0.0–149.0)
VLDL: 37.4 mg/dL (ref 0.0–40.0)

## 2021-03-23 MED ORDER — SYNTHROID 50 MCG PO TABS: ORAL_TABLET | ORAL | 5 refills | Status: DC

## 2021-03-23 NOTE — Telephone Encounter (Signed)
Pt came this am to have labs.. MD ok to order test../lmb

## 2021-03-23 NOTE — Telephone Encounter (Signed)
Pt is requesting a refill on her Synthroid 50 MCG Tablet. Pharmacy is [facility]. Thanks.

## 2021-03-23 NOTE — Addendum Note (Signed)
Addended by: Earnstine Regal on: 03/23/2021 09:41 AM   Modules accepted: Orders

## 2021-03-23 NOTE — Telephone Encounter (Signed)
Rx has been sent.Marland KitchenJohny Chess

## 2021-03-24 ENCOUNTER — Telehealth: Payer: Self-pay | Admitting: *Deleted

## 2021-03-24 NOTE — Telephone Encounter (Signed)
Rec'd PA for Dalonda Simoni name " Synthroid". Completed via cover-my-meds w/ Key: [id number] - PA Case ID: [id number] - Rx #: [id number]. Rec'd msg " This request has been approved." Coverage Start Date:02/22/2021;Coverage End Date:03/24/2022. Faxed approval to [facility].Marland KitchenJohny Chess

## 2021-03-25 ENCOUNTER — Other Ambulatory Visit: Payer: Self-pay

## 2021-03-25 ENCOUNTER — Encounter: Payer: Self-pay | Admitting: Internal Medicine

## 2021-03-25 ENCOUNTER — Ambulatory Visit
Admission: RE | Admit: 2021-03-25 | Discharge: 2021-03-25 | Disposition: A | Discharge status: Home / Self Care | Payer: Medicare Other | Source: Ambulatory Visit | Attending: Internal Medicine | Admitting: Internal Medicine

## 2021-03-25 ENCOUNTER — Ambulatory Visit (INDEPENDENT_AMBULATORY_CARE_PROVIDER_SITE_OTHER): Payer: Medicare Other | Admitting: Internal Medicine

## 2021-03-25 VITALS — BP 152/70 | HR 78 | Temp 97.9°F

## 2021-03-25 DIAGNOSIS — W19XXXA Unspecified fall, initial encounter: Secondary | ICD-10-CM

## 2021-03-25 DIAGNOSIS — S0083XA Contusion of other part of head, initial encounter: Secondary | ICD-10-CM | POA: Insufficient documentation

## 2021-03-25 DIAGNOSIS — E042 Nontoxic multinodular goiter: Secondary | ICD-10-CM

## 2021-03-25 DIAGNOSIS — W1800XA Striking against unspecified object with subsequent fall, initial encounter: Secondary | ICD-10-CM | POA: Insufficient documentation

## 2021-03-25 DIAGNOSIS — R519 Headache, unspecified: Secondary | ICD-10-CM | POA: Diagnosis not present

## 2021-03-25 DIAGNOSIS — G459 Transient cerebral ischemic attack, unspecified: Secondary | ICD-10-CM

## 2021-03-25 DIAGNOSIS — M2550 Pain in unspecified joint: Secondary | ICD-10-CM | POA: Diagnosis not present

## 2021-03-25 DIAGNOSIS — S0003XA Contusion of scalp, initial encounter: Secondary | ICD-10-CM | POA: Diagnosis not present

## 2021-03-25 LAB — SARS-COV-2 SEMI-QUANTITATIVE TOTAL ANTIBODY, SPIKE: SARS COV2 AB, Total Spike Semi QN: >2500 U/mL — ABNORMAL HIGH (ref <0.8)

## 2021-03-25 NOTE — Assessment & Plan Note (Addendum)
TIA versus ?micturitional near-syncope vs other  Status post a fall 2 days ago, pt lost her balance in the bathroom after she finished being up and stood up: She fell.  She was not sure how she fell.  Her legs got weak.  There was no loss of consciousness.  There was no confusion. She was on the floor for 10 min struggling to get up, but was unable to.  She did not hit her head/face during the first fall.  She hit her face while struggling to get up.  Eventually she was discovered by someone and asked for help.  [name] states that when she was helped to get up she felt normal and was able to walk and get to her apartment.  They did not call ambulance. No slurred speech Will obtain Head CT

## 2021-03-25 NOTE — Assessment & Plan Note (Addendum)
Arnica Gentle heat to hematoma Will obtain head CT

## 2021-03-25 NOTE — Patient Instructions (Signed)
Arnica Gentle heat

## 2021-03-25 NOTE — Assessment & Plan Note (Signed)
Will get Thyroid US

## 2021-03-25 NOTE — Progress Notes (Incomplete)
Subjective:  Patient ID: [name], female    DOB: 01-17-1936  Age: 85 y.o. MRN: [id number]  CC: Follow-up (3 month f/u- Pt states she fell the other day and hit her head. She states she lost her balance while taking a bath)   HPI [name] presents for a fall 2 days ago, pt lost her balance in the bathroom after she finished being up and stood up: She fell.  She was not sure how she fell.  Her legs got weak.  There was no loss of consciousness.  There was no confusion. She was on the floor for 10 min struggling to get up, but was unable to.  She did not hit her head/face during the first fall.  She hit her face while struggling to get up.  Eventually she was discovered by someone and asked for help.  [name] states that when she was helped to get up she felt normal and was able to walk and get to her apartment.  They did not call ambulance. No slurred speech   Outpatient Medications Prior to Visit  Medication Sig Dispense Refill  . Acetaminophen (TYLENOL PO) Take by mouth 3 (three) times daily.     [name] acetaminophen (TYLENOL) 500 MG tablet Take 1 by mouth three times a day    . azelastine (ASTELIN) 0.1 % nasal spray Place into both nostrils at bedtime. Use in each nostril as directed    . Calcium Carbonate-Vitamin D (CALCIUM-VITAMIN D) 500-200 MG-UNIT per tablet Take 3 tablets by mouth daily.    . Cholecalciferol 4000 units TABS Take 1 tablet by mouth daily.    [name] desonide (DESOWEN) 0.05 % cream Apply 1 application topically as needed.    . diclofenac Sodium (VOLTAREN) 1 % GEL APPLY 4 GRAMS 4 TIMES DAILY. 200 g 3  . EPINEPHrine (EPIPEN 2-PAK) 0.3 mg/0.3 mL IJ SOAJ injection Inject 0.3 mLs (0.3 mg total) into the muscle as needed. 1 Device 1  . estradiol (ESTRACE) 0.1 MG/GM vaginal cream Place vaginally.    . famotidine (PEPCID) 40 MG tablet TAKE ONE TABLET AT BEDTIME. 30 tablet 0  . ipratropium (ATROVENT) 0.06 % nasal spray Place 1 spray into the nose 3 (three) times daily as needed for  rhinitis. 15 mL 0  . levocetirizine (XYZAL) 5 MG tablet Take 5 mg by mouth every evening.    [name] LORazepam (ATIVAN) 1 MG tablet TAKE 2-3 TABLETS AT BEDTIME AND 1-2 TABLETS IN THE DAYTIME AS NEEDED. 150 tablet 1  . montelukast (SINGULAIR) 10 MG tablet Take 1 tablet (10 mg total) by mouth at bedtime. 90 tablet 1  . NONFORMULARY OR COMPOUNDED ITEM Estradiol 0.02% vaginal cream S:Insert 39ml vaginally twice a week. 90 each 3  . nystatin-triamcinolone ointment (MYCOLOG) Apply 1 application topically as needed.    . pantoprazole (PROTONIX) 40 MG tablet TAKE (1) TABLET TWICE A DAY BEFORE MEALS. 60 tablet 5  . prednisoLONE acetate (PRED FORTE) 1 % ophthalmic suspension INSTILL ONE DROP IN EACH EYE 4 TIMES A DAY FOR 5 DAYS    . SALINE NASAL SPRAY NA Place into the nose as needed.     . simvastatin (ZOCOR) 40 MG tablet TAKE 1 TABLET ONCE DAILY. 90 tablet 3  . SYNTHROID 50 MCG tablet TAKE 1 TABLET ONCE DAILY BEFORE BREAKFAST. 30 tablet 5  . traMADol (ULTRAM) 50 MG tablet Take 1 tablet (50 mg total) by mouth every 6 (six) hours as needed for moderate pain or severe pain. 120 tablet 3  .  triamcinolone cream (KENALOG) 0.1 % Apply 1 application topically 3 (three) times daily as needed. 80 g 2  . VESICARE 10 MG tablet Take 1 tablet by mouth as needed.      No facility-administered medications prior to visit.    ROS: Review of Systems  Constitutional: Negative for activity change, appetite change, chills, fatigue and unexpected weight change.  HENT: Negative for congestion, mouth sores and sinus pressure.   Eyes: Negative for visual disturbance.  Respiratory: Negative for cough and chest tightness.   Gastrointestinal: Negative for abdominal pain and nausea.  Genitourinary: Negative for difficulty urinating, frequency and vaginal pain.  Musculoskeletal: Positive for gait problem. Negative for back pain.  Skin: Negative for pallor, rash and wound.  Neurological: Negative for dizziness, tremors, weakness,  numbness and headaches.  Psychiatric/Behavioral: Negative for confusion and sleep disturbance.    Objective:  BP (!) 152/70 (BP Location: Left Arm)   Pulse 78   Temp 97.9 F (36.6 C) (Oral)   SpO2 97%   BP Readings from Last 3 Encounters:  03/25/21 (!) 152/70  03/04/21 104/69  02/02/21 122/70    Wt Readings from Last 3 Encounters:  03/04/21 129 lb (58.5 kg)  02/02/21 122 lb 9.6 oz (55.6 kg)  12/24/20 120 lb 12.8 oz (54.8 kg)    Physical Exam Constitutional:      General: She is not in acute distress.    Appearance: She is well-developed.  HENT:     Head: Normocephalic.     Right Ear: External ear normal.     Left Ear: External ear normal.     Nose: Nose normal.  Eyes:     General:        Right eye: No discharge.        Left eye: No discharge.     Conjunctiva/sclera: Conjunctivae normal.     Pupils: Pupils are equal, round, and reactive to light.  Neck:     Thyroid: No thyromegaly.     Vascular: No JVD.     Trachea: No tracheal deviation.  Cardiovascular:     Rate and Rhythm: Normal rate and regular rhythm.     Heart sounds: Normal heart sounds.  Pulmonary:     Effort: No respiratory distress.     Breath sounds: No stridor. No wheezing.  Abdominal:     General: Bowel sounds are normal. There is no distension.     Palpations: Abdomen is soft. There is no mass.     Tenderness: There is no abdominal tenderness. There is no guarding or rebound.  Musculoskeletal:        General: No tenderness.     Cervical back: Normal range of motion and neck supple.  Lymphadenopathy:     Cervical: No cervical adenopathy.  Skin:    Findings: Bruising present. No erythema or rash.  Neurological:     Mental Status: She is oriented to person, place, and time.     Cranial Nerves: No cranial nerve deficit.     Motor: No abnormal muscle tone.     Coordination: Coordination normal.     Gait: Gait normal.     Deep Tendon Reflexes: Reflexes normal.  Psychiatric:        Behavior:  Behavior normal.        Thought Content: Thought content normal.        Judgment: Judgment normal.   [name] has a left forehead hematoma measuring about 2 x 1 cm and a large bruise covering her left forehead.  Her gait is fairly normal.  No obvious ataxia.  No pronator drift.  Romberg is negative.  Heel-to-toe with some effort.  No tremor.  Lab Results  Component Value Date   WBC 5.3 09/18/2020   HGB 12.6 09/18/2020   HCT 37.1 09/18/2020   PLT 267.0 09/18/2020   GLUCOSE 94 03/23/2021   CHOL 200 03/23/2021   TRIG 187.0 (H) 03/23/2021   HDL 54.60 03/23/2021   LDLDIRECT 93.0 09/18/2020   LDLCALC 108 (H) 03/23/2021   ALT 34 03/23/2021   AST 21 03/23/2021   NA 137 03/23/2021   K 4.1 03/23/2021   CL 102 03/23/2021   CREATININE 0.81 03/23/2021   BUN 25 (H) 03/23/2021   CO2 25 03/23/2021   TSH 1.89 12/19/2020   HGBA1C 5.5 06/13/2020    ECHOCARDIOGRAM COMPLETE  Result Date: 02/02/2021    ECHOCARDIOGRAM REPORT   Patient Name:   KAREENA ARRAMBIDE Date of Exam: 02/02/2021 Medical Rec #:  [id number]   Height:       60.0 in Accession #:    [id number]  Weight:       120.8 lb Date of Birth:  [birth year]    BSA:          1.507 m Patient Age:    46 years    BP:           129/75 mmHg Patient Gender: F           HR:           80 bpm. Exam Location:  Outpatient Procedure: 2D Echo, 3D Echo, Cardiac Doppler, Color Doppler and Strain Analysis Indications:    I35.1 Nonrheumatic aortic (valve) insufficiency; I35.0                 Nonrheumatic aortic (valve) stenosis; I34.1 Nonrheumatic mitral                 (valve) prolapse  History:        Patient has prior history of Echocardiogram examinations, most                 recent 07/09/2019. Aortic Valve Disease, Mitral Valve Disease and                 Mitral Valve Prolapse; Risk Factors:Hypertension and                 Dyslipidemia.  Sonographer:    Roseanna Rainbow Referring Phys: [id number] DANIEL R BENSIMHON  Sonographer Comments: Global longitudinal strain was attempted. IMPRESSIONS   1. Left ventricular ejection fraction, by estimation, is 60 to 65%. The left ventricle has normal function. The left ventricle has no regional wall motion abnormalities. Left ventricular diastolic parameters are consistent with Grade I diastolic dysfunction (impaired relaxation).  2. Right ventricular systolic function is normal. The right ventricular size is normal. There is normal pulmonary artery systolic pressure.  3. The mitral valve is normal in structure. Mild mitral valve regurgitation. No evidence of mitral stenosis.  4. The aortic valve is tricuspid. There is moderate calcification of the aortic valve. Aortic valve regurgitation is mild. Mild aortic valve stenosis.  5. The inferior vena cava is normal in size with greater than 50% respiratory variability, suggesting right atrial pressure of 3 mmHg. FINDINGS  Left Ventricle: Left ventricular ejection fraction, by estimation, is 60 to 65%. The left ventricle has normal function. The left ventricle has no regional wall motion abnormalities. The left ventricular internal cavity size was normal in size. There is  no left ventricular hypertrophy. Left ventricular diastolic parameters are consistent with Grade I diastolic dysfunction (impaired relaxation). Right Ventricle: The right ventricular size is normal. No increase in right ventricular wall thickness. Right ventricular systolic function is normal. There is normal pulmonary artery systolic pressure. The tricuspid regurgitant velocity is 2.47 m/s, and  with an assumed right atrial pressure of 3 mmHg, the estimated right ventricular systolic pressure is [id number]. Left Atrium: Left atrial size was normal in size. Right Atrium: Right atrial size was normal in size. Pericardium: There is no evidence of pericardial effusion. Mitral Valve: The mitral valve is normal in structure. Mild mitral valve regurgitation. No evidence of mitral valve stenosis. Tricuspid Valve: The tricuspid valve is normal in structure.  Tricuspid valve regurgitation is trivial. No evidence of tricuspid stenosis. Aortic Valve: The aortic valve is tricuspid. There is moderate calcification of the aortic valve. Aortic valve regurgitation is mild. Aortic regurgitation PHT measures 404 msec. Mild aortic stenosis is present. Aortic valve mean gradient measures 7.0 mmHg. Aortic valve peak gradient measures 14.4 mmHg. Aortic valve area, by VTI measures 1.42 cm. Pulmonic Valve: The pulmonic valve was grossly normal. Pulmonic valve regurgitation is trivial. No evidence of pulmonic stenosis. Aorta: The aortic root is normal in size and structure. Venous: The inferior vena cava is normal in size with greater than 50% respiratory variability, suggesting right atrial pressure of 3 mmHg. IAS/Shunts: No atrial level shunt detected by color flow Doppler.  LEFT VENTRICLE PLAX 2D LVIDd:         3.90 cm     Diastology LVIDs:         2.30 cm     LV e' medial:    5.44 cm/s LV PW:         1.20 cm     LV E/e' medial:  13.2 LV IVS:        0.80 cm     LV e' lateral:   9.25 cm/s LVOT diam:     1.80 cm     LV E/e' lateral: 7.8 LV SV:         53 LV SV Index:   35 LVOT Area:     2.54 cm  LV Volumes (MOD) LV vol d, MOD A2C: 52.8 ml LV vol d, MOD A4C: 65.3 ml LV vol s, MOD A2C: 16.6 ml LV vol s, MOD A4C: 20.5 ml LV SV MOD A2C:     36.2 ml LV SV MOD A4C:     65.3 ml LV SV MOD BP:      40.6 ml RIGHT VENTRICLE             IVC RV S prime:     10.70 cm/s  IVC diam: 1.40 cm TAPSE (M-mode): 2.6 cm LEFT ATRIUM           Index       RIGHT ATRIUM           Index LA diam:      2.90 cm 1.92 cm/m  RA Area:     14.90 cm LA Vol (A2C): 35.0 ml 23.23 ml/m RA Volume:   35.40 ml  23.50 ml/m LA Vol (A4C): 13.9 ml 9.23 ml/m  AORTIC VALVE AV Area (Vmax):    1.39 cm AV Area (Vmean):   1.40 cm AV Area (VTI):     1.42 cm AV Vmax:           190.00 cm/s AV Vmean:  119.000 cm/s AV VTI:            0.372 m AV Peak Grad:      14.4 mmHg AV Mean Grad:      7.0 mmHg LVOT Vmax:         104.00  cm/s LVOT Vmean:        65.400 cm/s LVOT VTI:          0.207 m LVOT/AV VTI ratio: 0.56 AI PHT:            404 msec  AORTA Ao Root diam: 2.90 cm Ao Asc diam:  2.90 cm MITRAL VALVE               TRICUSPID VALVE MV Area (PHT): 2.91 cm    TR Peak grad:   24.4 mmHg MV Decel Time: 261 msec    TR Vmax:        247.00 cm/s MV E velocity: 72.00 cm/s MV A velocity: 87.80 cm/s  SHUNTS MV E/A ratio:  0.82        Systemic VTI:  0.21 m                            Systemic Diam: 1.80 cm Glori Bickers MD Electronically signed by Glori Bickers MD Signature Date/Time: 02/02/2021/2:46:07 PM    Final     Assessment & Plan:    Follow-up: Return in about 3 months (around 06/24/2021) for a follow-up visit.  Walker Kehr, MD

## 2021-03-25 NOTE — Progress Notes (Signed)
Subjective:  Patient ID: [name], female    DOB: January 19, 1936  Age: 85 y.o. MRN: [id number]  CC: Follow-up (3 month f/u- Pt states she fell the other day and hit her head. She states she lost her balance while taking a bath)   HPI Artavia [name] presents for a fall 2 days ago, pt lost her balance in the bathroom after she finished being up and stood up: She fell.  She was not sure how she fell.  Her legs got weak.  There was no loss of consciousness.  There was no confusion. She was on the floor for 10 min struggling to get up, but was unable to.  She did not hit her head/face during the first fall.  She hit her face while struggling to get up.  Eventually she was discovered by someone and asked for help.  Averly states that when she was helped to get up she felt normal and was able to walk and get to her apartment.  They did not call ambulance. No slurred speech   Outpatient Medications Prior to Visit  Medication Sig Dispense Refill  . Acetaminophen (TYLENOL PO) Take by mouth 3 (three) times daily.     [name] acetaminophen (TYLENOL) 500 MG tablet Take 1 by mouth three times a day    . azelastine (ASTELIN) 0.1 % nasal spray Place into both nostrils at bedtime. Use in each nostril as directed    . Calcium Carbonate-Vitamin D (CALCIUM-VITAMIN D) 500-200 MG-UNIT per tablet Take 3 tablets by mouth daily.    . Cholecalciferol 4000 units TABS Take 1 tablet by mouth daily.    [name] desonide (DESOWEN) 0.05 % cream Apply 1 application topically as needed.    . diclofenac Sodium (VOLTAREN) 1 % GEL APPLY 4 GRAMS 4 TIMES DAILY. 200 g 3  . EPINEPHrine (EPIPEN 2-PAK) 0.3 mg/0.3 mL IJ SOAJ injection Inject 0.3 mLs (0.3 mg total) into the muscle as needed. 1 Device 1  . estradiol (ESTRACE) 0.1 MG/GM vaginal cream Place vaginally.    . famotidine (PEPCID) 40 MG tablet TAKE ONE TABLET AT BEDTIME. 30 tablet 0  . ipratropium (ATROVENT) 0.06 % nasal spray Place 1 spray into the nose 3 (three) times daily as needed for  rhinitis. 15 mL 0  . levocetirizine (XYZAL) 5 MG tablet Take 5 mg by mouth every evening.    [name] LORazepam (ATIVAN) 1 MG tablet TAKE 2-3 TABLETS AT BEDTIME AND 1-2 TABLETS IN THE DAYTIME AS NEEDED. 150 tablet 1  . montelukast (SINGULAIR) 10 MG tablet Take 1 tablet (10 mg total) by mouth at bedtime. 90 tablet 1  . NONFORMULARY OR COMPOUNDED ITEM Estradiol 0.02% vaginal cream S:Insert 13ml vaginally twice a week. 90 each 3  . nystatin-triamcinolone ointment (MYCOLOG) Apply 1 application topically as needed.    . pantoprazole (PROTONIX) 40 MG tablet TAKE (1) TABLET TWICE A DAY BEFORE MEALS. 60 tablet 5  . prednisoLONE acetate (PRED FORTE) 1 % ophthalmic suspension INSTILL ONE DROP IN EACH EYE 4 TIMES A DAY FOR 5 DAYS    . SALINE NASAL SPRAY NA Place into the nose as needed.     . simvastatin (ZOCOR) 40 MG tablet TAKE 1 TABLET ONCE DAILY. 90 tablet 3  . SYNTHROID 50 MCG tablet TAKE 1 TABLET ONCE DAILY BEFORE BREAKFAST. 30 tablet 5  . traMADol (ULTRAM) 50 MG tablet Take 1 tablet (50 mg total) by mouth every 6 (six) hours as needed for moderate pain or severe pain. 120 tablet 3  .  triamcinolone cream (KENALOG) 0.1 % Apply 1 application topically 3 (three) times daily as needed. 80 g 2  . VESICARE 10 MG tablet Take 1 tablet by mouth as needed.      No facility-administered medications prior to visit.    ROS: Review of Systems  Constitutional: Negative for activity change, appetite change, chills, fatigue and unexpected weight change.  HENT: Negative for congestion, mouth sores and sinus pressure.   Eyes: Negative for visual disturbance.  Respiratory: Negative for cough and chest tightness.   Gastrointestinal: Negative for abdominal pain and nausea.  Genitourinary: Negative for difficulty urinating, frequency and vaginal pain.  Musculoskeletal: Positive for gait problem. Negative for back pain.  Skin: Negative for pallor, rash and wound.  Neurological: Negative for dizziness, tremors, weakness,  numbness and headaches.  Psychiatric/Behavioral: Negative for confusion and sleep disturbance.    Objective:  BP (!) 152/70 (BP Location: Left Arm)   Pulse 78   Temp 97.9 F (36.6 C) (Oral)   SpO2 97%   BP Readings from Last 3 Encounters:  03/25/21 (!) 152/70  03/04/21 104/69  02/02/21 122/70    Wt Readings from Last 3 Encounters:  03/04/21 129 lb (58.5 kg)  02/02/21 122 lb 9.6 oz (55.6 kg)  12/24/20 120 lb 12.8 oz (54.8 kg)    Physical Exam Constitutional:      General: She is not in acute distress.    Appearance: She is well-developed.  HENT:     Head: Normocephalic.     Right Ear: External ear normal.     Left Ear: External ear normal.     Nose: Nose normal.  Eyes:     General:        Right eye: No discharge.        Left eye: No discharge.     Conjunctiva/sclera: Conjunctivae normal.     Pupils: Pupils are equal, round, and reactive to light.  Neck:     Thyroid: No thyromegaly.     Vascular: No JVD.     Trachea: No tracheal deviation.  Cardiovascular:     Rate and Rhythm: Normal rate and regular rhythm.     Heart sounds: Normal heart sounds.  Pulmonary:     Effort: No respiratory distress.     Breath sounds: No stridor. No wheezing.  Abdominal:     General: Bowel sounds are normal. There is no distension.     Palpations: Abdomen is soft. There is no mass.     Tenderness: There is no abdominal tenderness. There is no guarding or rebound.  Musculoskeletal:        General: No tenderness.     Cervical back: Normal range of motion and neck supple.  Lymphadenopathy:     Cervical: No cervical adenopathy.  Skin:    Findings: Bruising present. No erythema or rash.  Neurological:     Mental Status: She is oriented to person, place, and time.     Cranial Nerves: No cranial nerve deficit.     Motor: No abnormal muscle tone.     Coordination: Coordination normal.     Gait: Gait normal.     Deep Tendon Reflexes: Reflexes normal.  Psychiatric:        Behavior:  Behavior normal.        Thought Content: Thought content normal.        Judgment: Judgment normal.   Mikhaila has a left forehead hematoma measuring about 2 x 1 cm and a large bruise covering her left forehead.  Her gait is fairly normal.  No obvious ataxia.  No pronator drift.  Romberg is negative.  Heel-to-toe with some effort.  No tremor.  Lab Results  Component Value Date   WBC 5.3 09/18/2020   HGB 12.6 09/18/2020   HCT 37.1 09/18/2020   PLT 267.0 09/18/2020   GLUCOSE 94 03/23/2021   CHOL 200 03/23/2021   TRIG 187.0 (H) 03/23/2021   HDL 54.60 03/23/2021   LDLDIRECT 93.0 09/18/2020   LDLCALC 108 (H) 03/23/2021   ALT 34 03/23/2021   AST 21 03/23/2021   NA 137 03/23/2021   K 4.1 03/23/2021   CL 102 03/23/2021   CREATININE 0.81 03/23/2021   BUN 25 (H) 03/23/2021   CO2 25 03/23/2021   TSH 1.89 12/19/2020   HGBA1C 5.5 06/13/2020    ECHOCARDIOGRAM COMPLETE  Result Date: 02/02/2021    ECHOCARDIOGRAM REPORT   Patient Name:   KAREENA ARRAMBIDE Date of Exam: 02/02/2021 Medical Rec #:  [id number]   Height:       60.0 in Accession #:    [id number]  Weight:       120.8 lb Date of Birth:  [birth year]    BSA:          1.507 m Patient Age:    85 years    BP:           129/75 mmHg Patient Gender: F           HR:           80 bpm. Exam Location:  Outpatient Procedure: 2D Echo, 3D Echo, Cardiac Doppler, Color Doppler and Strain Analysis Indications:    I35.1 Nonrheumatic aortic (valve) insufficiency; I35.0                 Nonrheumatic aortic (valve) stenosis; I34.1 Nonrheumatic mitral                 (valve) prolapse  History:        Patient has prior history of Echocardiogram examinations, most                 recent 07/09/2019. Aortic Valve Disease, Mitral Valve Disease and                 Mitral Valve Prolapse; Risk Factors:Hypertension and                 Dyslipidemia.  Sonographer:    Roseanna Rainbow Referring Phys: [id number] DANIEL R BENSIMHON  Sonographer Comments: Global longitudinal strain was attempted. IMPRESSIONS   1. Left ventricular ejection fraction, by estimation, is 60 to 65%. The left ventricle has normal function. The left ventricle has no regional wall motion abnormalities. Left ventricular diastolic parameters are consistent with Grade I diastolic dysfunction (impaired relaxation).  2. Right ventricular systolic function is normal. The right ventricular size is normal. There is normal pulmonary artery systolic pressure.  3. The mitral valve is normal in structure. Mild mitral valve regurgitation. No evidence of mitral stenosis.  4. The aortic valve is tricuspid. There is moderate calcification of the aortic valve. Aortic valve regurgitation is mild. Mild aortic valve stenosis.  5. The inferior vena cava is normal in size with greater than 50% respiratory variability, suggesting right atrial pressure of 3 mmHg. FINDINGS  Left Ventricle: Left ventricular ejection fraction, by estimation, is 60 to 65%. The left ventricle has normal function. The left ventricle has no regional wall motion abnormalities. The left ventricular internal cavity size was normal in size. There is  no left ventricular hypertrophy. Left ventricular diastolic parameters are consistent with Grade I diastolic dysfunction (impaired relaxation). Right Ventricle: The right ventricular size is normal. No increase in right ventricular wall thickness. Right ventricular systolic function is normal. There is normal pulmonary artery systolic pressure. The tricuspid regurgitant velocity is 2.47 m/s, and  with an assumed right atrial pressure of 3 mmHg, the estimated right ventricular systolic pressure is [id number]. Left Atrium: Left atrial size was normal in size. Right Atrium: Right atrial size was normal in size. Pericardium: There is no evidence of pericardial effusion. Mitral Valve: The mitral valve is normal in structure. Mild mitral valve regurgitation. No evidence of mitral valve stenosis. Tricuspid Valve: The tricuspid valve is normal in structure.  Tricuspid valve regurgitation is trivial. No evidence of tricuspid stenosis. Aortic Valve: The aortic valve is tricuspid. There is moderate calcification of the aortic valve. Aortic valve regurgitation is mild. Aortic regurgitation PHT measures 404 msec. Mild aortic stenosis is present. Aortic valve mean gradient measures 7.0 mmHg. Aortic valve peak gradient measures 14.4 mmHg. Aortic valve area, by VTI measures 1.42 cm. Pulmonic Valve: The pulmonic valve was grossly normal. Pulmonic valve regurgitation is trivial. No evidence of pulmonic stenosis. Aorta: The aortic root is normal in size and structure. Venous: The inferior vena cava is normal in size with greater than 50% respiratory variability, suggesting right atrial pressure of 3 mmHg. IAS/Shunts: No atrial level shunt detected by color flow Doppler.  LEFT VENTRICLE PLAX 2D LVIDd:         3.90 cm     Diastology LVIDs:         2.30 cm     LV e' medial:    5.44 cm/s LV PW:         1.20 cm     LV E/e' medial:  13.2 LV IVS:        0.80 cm     LV e' lateral:   9.25 cm/s LVOT diam:     1.80 cm     LV E/e' lateral: 7.8 LV SV:         53 LV SV Index:   35 LVOT Area:     2.54 cm  LV Volumes (MOD) LV vol d, MOD A2C: 52.8 ml LV vol d, MOD A4C: 65.3 ml LV vol s, MOD A2C: 16.6 ml LV vol s, MOD A4C: 20.5 ml LV SV MOD A2C:     36.2 ml LV SV MOD A4C:     65.3 ml LV SV MOD BP:      40.6 ml RIGHT VENTRICLE             IVC RV S prime:     10.70 cm/s  IVC diam: 1.40 cm TAPSE (M-mode): 2.6 cm LEFT ATRIUM           Index       RIGHT ATRIUM           Index LA diam:      2.90 cm 1.92 cm/m  RA Area:     14.90 cm LA Vol (A2C): 35.0 ml 23.23 ml/m RA Volume:   35.40 ml  23.50 ml/m LA Vol (A4C): 13.9 ml 9.23 ml/m  AORTIC VALVE AV Area (Vmax):    1.39 cm AV Area (Vmean):   1.40 cm AV Area (VTI):     1.42 cm AV Vmax:           190.00 cm/s AV Vmean:  119.000 cm/s AV VTI:            0.372 m AV Peak Grad:      14.4 mmHg AV Mean Grad:      7.0 mmHg LVOT Vmax:         104.00  cm/s LVOT Vmean:        65.400 cm/s LVOT VTI:          0.207 m LVOT/AV VTI ratio: 0.56 AI PHT:            404 msec  AORTA Ao Root diam: 2.90 cm Ao Asc diam:  2.90 cm MITRAL VALVE               TRICUSPID VALVE MV Area (PHT): 2.91 cm    TR Peak grad:   24.4 mmHg MV Decel Time: 261 msec    TR Vmax:        247.00 cm/s MV E velocity: 72.00 cm/s MV A velocity: 87.80 cm/s  SHUNTS MV E/A ratio:  0.82        Systemic VTI:  0.21 m                            Systemic Diam: 1.80 cm Glori Bickers MD Electronically signed by Glori Bickers MD Signature Date/Time: 02/02/2021/2:46:07 PM    Final     Assessment & Plan:    Follow-up: Return in about 3 months (around 06/24/2021) for a follow-up visit.  Walker Kehr, MD

## 2021-03-26 DIAGNOSIS — G459 Transient cerebral ischemic attack, unspecified: Secondary | ICD-10-CM | POA: Insufficient documentation

## 2021-03-26 NOTE — Assessment & Plan Note (Signed)
Better after bilateral hip/bursa injection by rheumatology

## 2021-03-26 NOTE — Assessment & Plan Note (Signed)
TIA versus ?micturitional near-syncope vs other  Status post a fall 2 days ago, pt lost her balance in the bathroom after she finished being up and stood up: She fell.  She was not sure how she fell.  Her legs got weak.  There was no loss of consciousness.  There was no confusion. She was on the floor for 10 min struggling to get up, but was unable to.  She did not hit her head/face during the first fall.  She hit her face while struggling to get up.  Eventually she was discovered by someone and asked for help.  [name] states that when she was helped to get up she felt normal and was able to walk and get to her apartment.  They did not call ambulance. No slurred speech Will obtain Head CT We will obtain carotid Doppler ultrasound.  Start aspirin.  Continue simvastatin.  Previous brain MRI and CT reports were reviewed.

## 2021-03-30 ENCOUNTER — Other Ambulatory Visit: Payer: Self-pay | Admitting: Internal Medicine

## 2021-03-30 ENCOUNTER — Telehealth: Payer: Self-pay | Admitting: Internal Medicine

## 2021-03-30 MED ORDER — MONTELUKAST SODIUM 10 MG PO TABS: 10.0000 mg | ORAL_TABLET | Freq: Every day | ORAL | 1 refills | Status: DC

## 2021-03-30 NOTE — Progress Notes (Deleted)
Subjective:  Patient ID: [name], female    DOB: January 13, 1936  Age: 85 y.o. MRN: [id number]  CC: No chief complaint on file.   HPI [name] presents for ***  Outpatient Medications Prior to Visit  Medication Sig Dispense Refill  . Acetaminophen (TYLENOL PO) Take by mouth 3 (three) times daily.     [name] acetaminophen (TYLENOL) 500 MG tablet Take 1 by mouth three times a day    . azelastine (ASTELIN) 0.1 % nasal spray Place into both nostrils at bedtime. Use in each nostril as directed    . Calcium Carbonate-Vitamin D (CALCIUM-VITAMIN D) 500-200 MG-UNIT per tablet Take 3 tablets by mouth daily.    . Cholecalciferol 4000 units TABS Take 1 tablet by mouth daily.    [name] desonide (DESOWEN) 0.05 % cream Apply 1 application topically as needed.    . diclofenac Sodium (VOLTAREN) 1 % GEL APPLY 4 GRAMS 4 TIMES DAILY. 200 g 3  . EPINEPHrine (EPIPEN 2-PAK) 0.3 mg/0.3 mL IJ SOAJ injection Inject 0.3 mLs (0.3 mg total) into the muscle as needed. 1 Device 1  . estradiol (ESTRACE) 0.1 MG/GM vaginal cream Place vaginally.    . famotidine (PEPCID) 40 MG tablet TAKE ONE TABLET AT BEDTIME. 30 tablet 0  . ipratropium (ATROVENT) 0.06 % nasal spray Place 1 spray into the nose 3 (three) times daily as needed for rhinitis. 15 mL 0  . levocetirizine (XYZAL) 5 MG tablet Take 5 mg by mouth every evening.    [name] LORazepam (ATIVAN) 1 MG tablet TAKE 2-3 TABLETS AT BEDTIME AND 1-2 TABLETS IN THE DAYTIME AS NEEDED. 150 tablet 1  . NONFORMULARY OR COMPOUNDED ITEM Estradiol 0.02% vaginal cream S:Insert 26ml vaginally twice a week. 90 each 3  . nystatin-triamcinolone ointment (MYCOLOG) Apply 1 application topically as needed.    . pantoprazole (PROTONIX) 40 MG tablet TAKE (1) TABLET TWICE A DAY BEFORE MEALS. 60 tablet 5  . prednisoLONE acetate (PRED FORTE) 1 % ophthalmic suspension INSTILL ONE DROP IN EACH EYE 4 TIMES A DAY FOR 5 DAYS    . SALINE NASAL SPRAY NA Place into the nose as needed.     . simvastatin (ZOCOR) 40 MG  tablet TAKE 1 TABLET ONCE DAILY. 90 tablet 3  . SYNTHROID 50 MCG tablet TAKE 1 TABLET ONCE DAILY BEFORE BREAKFAST. 30 tablet 5  . traMADol (ULTRAM) 50 MG tablet Take 1 tablet (50 mg total) by mouth every 6 (six) hours as needed for moderate pain or severe pain. 120 tablet 3  . triamcinolone cream (KENALOG) 0.1 % Apply 1 application topically 3 (three) times daily as needed. 80 g 2  . VESICARE 10 MG tablet Take 1 tablet by mouth as needed.     . montelukast (SINGULAIR) 10 MG tablet Take 1 tablet (10 mg total) by mouth at bedtime. 90 tablet 1   No facility-administered medications prior to visit.    ROS: Review of Systems  Objective:  There were no vitals taken for this visit.  BP Readings from Last 3 Encounters:  03/25/21 (!) 152/70  03/04/21 104/69  02/02/21 122/70    Wt Readings from Last 3 Encounters:  03/04/21 129 lb (58.5 kg)  02/02/21 122 lb 9.6 oz (55.6 kg)  12/24/20 120 lb 12.8 oz (54.8 kg)    Physical Exam  Lab Results  Component Value Date   WBC 5.3 09/18/2020   HGB 12.6 09/18/2020   HCT 37.1 09/18/2020   PLT 267.0 09/18/2020   GLUCOSE 94 03/23/2021  CHOL 200 03/23/2021   TRIG 187.0 (H) 03/23/2021   HDL 54.60 03/23/2021   LDLDIRECT 93.0 09/18/2020   LDLCALC 108 (H) 03/23/2021   ALT 34 03/23/2021   AST 21 03/23/2021   NA 137 03/23/2021   K 4.1 03/23/2021   CL 102 03/23/2021   CREATININE 0.81 03/23/2021   BUN 25 (H) 03/23/2021   CO2 25 03/23/2021   TSH 1.89 12/19/2020   HGBA1C 5.5 06/13/2020    CT Head Wo Contrast  Result Date: 03/25/2021 CLINICAL DATA:  Pain following fall with headache EXAM: CT HEAD WITHOUT CONTRAST TECHNIQUE: Contiguous axial images were obtained from the base of the skull through the vertex without intravenous contrast. COMPARISON:  Head CT December 27, 2018; brain MRI March 06, 2020. FINDINGS: Brain: Age related volume loss is stable. There is a stable calcified left frontal meningioma measuring 1.6 x 1.3 cm without associated edema  or mass effect. No other mass evident. There is no hemorrhage, extra-axial fluid collection, or midline shift. There is slight decreased attenuation in portions of the centra semiovale bilaterally consistent with slight periventricular small vessel disease. 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 No acute infarct is appreciable. Vascular: No appreciable hyperdense vessel. There are foci of calcification in each carotid siphon region. Skull: The bony calvarium appears intact. There is a focal left frontal scalp hematoma. Sinuses/Orbits: Visualized paranasal sinuses are clear. Orbits appear symmetric bilaterally. Other: Mastoid air cells are clear. IMPRESSION: Age related volume loss with minimal periventricular small vessel disease. 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 No acute infarct. Stable calcified left frontal meningioma without associated mass effect or edema. Small left frontal scalp hematoma.  No fracture. Foci of arterial vascular calcification. Electronically Signed   By: Lowella Grip III M.D.   On: 03/25/2021 15:38    Assessment & Plan:   There are no diagnoses linked to this encounter.   Meds ordered this encounter  Medications  . montelukast (SINGULAIR) 10 MG tablet    Sig: Take 1 tablet (10 mg total) by mouth at bedtime.    Dispense:  90 tablet    Refill:  1     Follow-up: No follow-ups on file.  Walker Kehr, MD

## 2021-03-30 NOTE — Telephone Encounter (Signed)
Patient states that the pharmacy will not give her a refill of her medication. She has a very limited supply left and will need a refill soon.   Please advise.   1.Medication Requested: montelukast (SINGULAIR) 10 MG tablet 2. Pharmacy (Name, [place], [facility]): [place], [address]  Phone:  [phone]  Fax:  [phone]   3. On Med List: yes    4. Last Visit with PCP: 03/25/2021   5. Next visit date with PCP: n/a   Agent: Please be advised that RX refills may take up to 3 business days. We ask that you follow-up with your pharmacy.

## 2021-03-31 NOTE — Telephone Encounter (Signed)
Done thx

## 2021-04-06 ENCOUNTER — Ambulatory Visit
Admission: RE | Admit: 2021-04-06 | Discharge: 2021-04-06 | Disposition: A | Discharge status: Home / Self Care | Payer: Medicare Other | Source: Ambulatory Visit | Attending: Internal Medicine | Admitting: Internal Medicine

## 2021-04-06 DIAGNOSIS — E041 Nontoxic single thyroid nodule: Secondary | ICD-10-CM | POA: Diagnosis not present

## 2021-04-07 ENCOUNTER — Other Ambulatory Visit: Payer: Self-pay | Admitting: Gastroenterology

## 2021-04-08 ENCOUNTER — Other Ambulatory Visit: Payer: Self-pay | Admitting: Internal Medicine

## 2021-04-08 ENCOUNTER — Encounter: Payer: Self-pay | Admitting: Internal Medicine

## 2021-04-08 ENCOUNTER — Ambulatory Visit (INDEPENDENT_AMBULATORY_CARE_PROVIDER_SITE_OTHER): Payer: Medicare Other | Admitting: Internal Medicine

## 2021-04-08 ENCOUNTER — Other Ambulatory Visit: Payer: Self-pay

## 2021-04-08 DIAGNOSIS — G459 Transient cerebral ischemic attack, unspecified: Secondary | ICD-10-CM

## 2021-04-08 DIAGNOSIS — E785 Hyperlipidemia, unspecified: Secondary | ICD-10-CM | POA: Diagnosis not present

## 2021-04-08 DIAGNOSIS — E042 Nontoxic multinodular goiter: Secondary | ICD-10-CM

## 2021-04-08 DIAGNOSIS — I350 Nonrheumatic aortic (valve) stenosis: Secondary | ICD-10-CM | POA: Diagnosis not present

## 2021-04-08 DIAGNOSIS — D329 Benign neoplasm of meninges, unspecified: Secondary | ICD-10-CM

## 2021-04-08 MED ORDER — PAXLOVID 20 X 150 MG & 10 X 100MG PO TBPK: 3.0000 | ORAL_TABLET | Freq: Two times a day (BID) | ORAL | 0 refills | Status: DC

## 2021-04-08 MED ORDER — ASPIRIN 81 MG PO TBEC
81.0000 mg | DELAYED_RELEASE_TABLET | Freq: Every day | ORAL | 12 refills | Status: DC
Start: 2021-04-08 — End: 2021-05-13

## 2021-04-08 NOTE — Assessment & Plan Note (Signed)
No syncope, DOE

## 2021-04-08 NOTE — Progress Notes (Signed)
Subjective:  Patient ID: [name], female    DOB: 12-27-1935  Age: 85 y.o. MRN: [id number]  CC: Follow-up (2 week f/u)   HPI [name] presents for a fall and a ?TIA f/u  - no relapse F/u meningioma C/o fear of COVID,[name] is traveling to [place] for [employer]uation  Outpatient Medications Prior to Visit  Medication Sig Dispense Refill  . Acetaminophen (TYLENOL PO) Take by mouth 3 (three) times daily.     [name] acetaminophen (TYLENOL) 500 MG tablet Take 1 by mouth three times a day    . azelastine (ASTELIN) 0.1 % nasal spray Place into both nostrils at bedtime. Use in each nostril as directed    . Calcium Carbonate-Vitamin D (CALCIUM-VITAMIN D) 500-200 MG-UNIT per tablet Take 3 tablets by mouth daily.    . Cholecalciferol 4000 units TABS Take 1 tablet by mouth daily.    [name] desonide (DESOWEN) 0.05 % cream Apply 1 application topically as needed.    . diclofenac Sodium (VOLTAREN) 1 % GEL APPLY 4 GRAMS 4 TIMES DAILY. 200 g 3  . EPINEPHrine (EPIPEN 2-PAK) 0.3 mg/0.3 mL IJ SOAJ injection Inject 0.3 mLs (0.3 mg total) into the muscle as needed. 1 Device 1  . estradiol (ESTRACE) 0.1 MG/GM vaginal cream Place vaginally.    . famotidine (PEPCID) 40 MG tablet TAKE ONE TABLET AT BEDTIME. 30 tablet 0  . ipratropium (ATROVENT) 0.06 % nasal spray Place 1 spray into the nose 3 (three) times daily as needed for rhinitis. 15 mL 0  . levocetirizine (XYZAL) 5 MG tablet Take 5 mg by mouth every evening.    [name] LORazepam (ATIVAN) 1 MG tablet TAKE 2-3 TABLETS AT BEDTIME AND 1-2 TABLETS IN THE DAYTIME AS NEEDED. 150 tablet 1  . montelukast (SINGULAIR) 10 MG tablet Take 1 tablet (10 mg total) by mouth at bedtime. 90 tablet 1  . NONFORMULARY OR COMPOUNDED ITEM Estradiol 0.02% vaginal cream S:Insert 56ml vaginally twice a week. 90 each 3  . nystatin-triamcinolone ointment (MYCOLOG) Apply 1 application topically as needed.    . pantoprazole (PROTONIX) 40 MG tablet TAKE (1) TABLET TWICE A DAY BEFORE  MEALS. 60 tablet 5  . prednisoLONE acetate (PRED FORTE) 1 % ophthalmic suspension INSTILL ONE DROP IN EACH EYE 4 TIMES A DAY FOR 5 DAYS    . SALINE NASAL SPRAY NA Place into the nose as needed.     . simvastatin (ZOCOR) 40 MG tablet TAKE 1 TABLET ONCE DAILY. 90 tablet 3  . SYNTHROID 50 MCG tablet TAKE 1 TABLET ONCE DAILY BEFORE BREAKFAST. 30 tablet 5  . traMADol (ULTRAM) 50 MG tablet Take 1 tablet (50 mg total) by mouth every 6 (six) hours as needed for moderate pain or severe pain. 120 tablet 3  . triamcinolone cream (KENALOG) 0.1 % Apply 1 application topically 3 (three) times daily as needed. 80 g 2  . VESICARE 10 MG tablet Take 1 tablet by mouth as needed.      No facility-administered medications prior to visit.    ROS: Review of Systems  Constitutional: Negative for activity change, appetite change, chills, fatigue and unexpected weight change.  HENT: Negative for congestion, mouth sores and sinus pressure.   Eyes: Negative for visual disturbance.  Respiratory: Negative for cough and chest tightness.   Gastrointestinal: Negative for abdominal pain and nausea.  Genitourinary: Negative for difficulty urinating, frequency and vaginal pain.  Musculoskeletal: Negative for back pain and gait problem.  Skin: Negative for pallor and rash.  Neurological: Negative for dizziness, tremors, weakness, numbness and headaches.  Psychiatric/Behavioral: Positive for decreased concentration. Negative for confusion and sleep disturbance.    Objective:  BP 130/78 (BP Location: Left Arm)   Pulse 87   Temp (!) 97.5 F (36.4 C) (Oral)   SpO2 93%   BP Readings from Last 3 Encounters:  04/08/21 130/78  03/25/21 (!) 152/70  03/04/21 104/69    Wt Readings from Last 3 Encounters:  03/04/21 129 lb (58.5 kg)  02/02/21 122 lb 9.6 oz (55.6 kg)  12/24/20 120 lb 12.8 oz (54.8 kg)    Physical Exam Constitutional:      General: She is not in acute distress.    Appearance: She is well-developed.   HENT:     Head: Normocephalic.     Right Ear: External ear normal.     Left Ear: External ear normal.     Nose: Nose normal.  Eyes:     General:        Right eye: No discharge.        Left eye: No discharge.     Conjunctiva/sclera: Conjunctivae normal.     Pupils: Pupils are equal, round, and reactive to light.  Neck:     Thyroid: No thyromegaly.     Vascular: No JVD.     Trachea: No tracheal deviation.  Cardiovascular:     Rate and Rhythm: Normal rate and regular rhythm.     Heart sounds: Normal heart sounds.  Pulmonary:     Effort: No respiratory distress.     Breath sounds: No stridor. No wheezing.  Abdominal:     General: Bowel sounds are normal. There is no distension.     Palpations: Abdomen is soft. There is no mass.     Tenderness: There is no abdominal tenderness. There is no guarding or rebound.  Musculoskeletal:        General: No tenderness.     Cervical back: Normal range of motion and neck supple.  Lymphadenopathy:     Cervical: No cervical adenopathy.  Skin:    Findings: No erythema or rash.  Neurological:     Mental Status: She is oriented to person, place, and time.     Cranial Nerves: No cranial nerve deficit.     Motor: No weakness or abnormal muscle tone.     Coordination: Coordination normal.     Gait: Gait normal.     Deep Tendon Reflexes: Reflexes normal.  Psychiatric:        Behavior: Behavior normal.        Thought Content: Thought content normal.        Judgment: Judgment normal.    L face bruise - is resolving Neuro nonfocal No ataxia   Lab Results  Component Value Date   WBC 5.3 09/18/2020   HGB 12.6 09/18/2020   HCT 37.1 09/18/2020   PLT 267.0 09/18/2020   GLUCOSE 94 03/23/2021   CHOL 200 03/23/2021   TRIG 187.0 (H) 03/23/2021   HDL 54.60 03/23/2021   LDLDIRECT 93.0 09/18/2020   LDLCALC 108 (H) 03/23/2021   ALT 34 03/23/2021   AST 21 03/23/2021   NA 137 03/23/2021   K 4.1 03/23/2021   CL 102 03/23/2021   CREATININE 0.81  03/23/2021   BUN 25 (H) 03/23/2021   CO2 25 03/23/2021   TSH 1.89 12/19/2020   HGBA1C 5.5 06/13/2020    US THYROID  Result Date: 04/07/2021 CLINICAL DATA:  Prior ultrasound follow-up. EXAM: THYROID ULTRASOUND TECHNIQUE: Ultrasound  examination of the thyroid gland and adjacent soft tissues was performed. COMPARISON:  03/21/2018 FINDINGS: Parenchymal Echotexture: Mildly heterogenous Isthmus: 0.3 cm, previously 0.2 cm Right lobe: 3.0 x 1.3 x 1.1 cm, previously 3.2 x 1.0 x 1.3 cm Left lobe: 4.2 x 0.8 x 1.1 cm, previously 3.2 x 1.0 x 1.1 cm _________________________________________________________ Estimated total number of nodules >/= 1 cm: 1 Number of spongiform nodules >/=  2 cm not described below (TR1): 0 Number of mixed cystic and solid nodules >/= 1.5 cm not described below ([place]): 0 _________________________________________________________ Nodule # 1: Prior biopsy: No Location: Right; Superior Maximum size: 1.0 cm; Other 2 dimensions: Sero 0.8 x 0.6 cm, previously, 1.0 x 0.7 x 0.6 cm Composition: solid/almost completely solid (2) Echogenicity: isoechoic (1) Shape: not taller-than-wide (0) Margins: ill-defined (0) Echogenic foci: none (0) ACR TI-RADS total points: 3. ACR TI-RADS risk category:  TR3 (3 points). Significant change in size (>/= 20% in two dimensions and minimal increase of 2 mm): No Change in features: No Change in ACR TI-RADS risk category: No ACR TI-RADS recommendations: Given size (<1.4 cm) and appearance, this nodule does NOT meet TI-RADS criteria for biopsy or dedicated follow-up. _________________________________________________________ No cervical lymphadenopathy. IMPRESSION: Unchanged benign-apearing right superior solid thyroid nodule (labeled 1, 1.0 cm, TI-RADS category 3). This nodule does not meet criteria for dedicated ultrasound follow-up or tissue sampling. The above is in keeping with the ACR TI-RADS recommendations - J Am Coll Radiol 2017;14:587-595. Ruthann Cancer, MD [facility] Electronically Signed   By: Ruthann Cancer MD   On: 04/07/2021 09:11    Assessment & Plan:    Walker Kehr, MD

## 2021-04-08 NOTE — Assessment & Plan Note (Addendum)
No relapse ASA 81 mg/d MRI brain Carotid Dulex [place] B Neurol ref

## 2021-04-08 NOTE — Assessment & Plan Note (Signed)
On Simvastatin 

## 2021-04-08 NOTE — Assessment & Plan Note (Signed)
Repeat brain MRI Neurol consult

## 2021-04-08 NOTE — Assessment & Plan Note (Signed)
Thyr [place] OK - d/w Murray Hodgkins

## 2021-04-10 ENCOUNTER — Telehealth: Payer: Self-pay | Admitting: *Deleted

## 2021-04-10 NOTE — Telephone Encounter (Addendum)
Deductible $233 ($215met)  OOP MAX  n/a  Annual exam  07/02/2020 JK  Calcium  8.9           Date 03/23/2021  Upcoming dental procedures   Prior Authorization needed NO  Pt estimated Cost $0  appt 04/30/2021    Coverage Details: $0 ONE DOSE, $0 ADMIN FEE

## 2021-04-14 NOTE — Telephone Encounter (Signed)
According to last visit 04/08/21.[name] a referral for MRI was placed and it was sent to [facility] and they supposed to contact pt to schedule. You did mention about having a carotid U/S but no referral was placed.Marland KitchenJohny [name]

## 2021-04-15 DIAGNOSIS — D485 Neoplasm of uncertain behavior of skin: Secondary | ICD-10-CM | POA: Diagnosis not present

## 2021-04-15 DIAGNOSIS — R58 Hemorrhage, not elsewhere classified: Secondary | ICD-10-CM | POA: Diagnosis not present

## 2021-04-15 DIAGNOSIS — B079 Viral wart, unspecified: Secondary | ICD-10-CM | POA: Diagnosis not present

## 2021-04-15 DIAGNOSIS — L570 Actinic keratosis: Secondary | ICD-10-CM | POA: Diagnosis not present

## 2021-04-15 DIAGNOSIS — L578 Other skin changes due to chronic exposure to nonionizing radiation: Secondary | ICD-10-CM | POA: Diagnosis not present

## 2021-04-20 ENCOUNTER — Other Ambulatory Visit: Payer: Self-pay | Admitting: Internal Medicine

## 2021-04-20 DIAGNOSIS — G459 Transient cerebral ischemic attack, unspecified: Secondary | ICD-10-CM

## 2021-04-23 ENCOUNTER — Ambulatory Visit (HOSPITAL_COMMUNITY)
Admission: RE | Admit: 2021-04-23 | Discharge: 2021-04-23 | Disposition: A | Discharge status: Home / Self Care | Payer: Medicare Other | Source: Ambulatory Visit | Attending: Cardiology | Admitting: Cardiology

## 2021-04-23 ENCOUNTER — Other Ambulatory Visit: Payer: Self-pay

## 2021-04-23 DIAGNOSIS — G459 Transient cerebral ischemic attack, unspecified: Secondary | ICD-10-CM | POA: Insufficient documentation

## 2021-04-25 ENCOUNTER — Ambulatory Visit
Admission: RE | Admit: 2021-04-25 | Discharge: 2021-04-25 | Disposition: A | Discharge status: Home / Self Care | Payer: Medicare Other | Source: Ambulatory Visit | Attending: Internal Medicine | Admitting: Internal Medicine

## 2021-04-25 ENCOUNTER — Other Ambulatory Visit: Payer: Self-pay

## 2021-04-25 DIAGNOSIS — G319 Degenerative disease of nervous system, unspecified: Secondary | ICD-10-CM | POA: Diagnosis not present

## 2021-04-25 DIAGNOSIS — D329 Benign neoplasm of meninges, unspecified: Secondary | ICD-10-CM

## 2021-04-25 DIAGNOSIS — G939 Disorder of brain, unspecified: Secondary | ICD-10-CM | POA: Diagnosis not present

## 2021-04-25 DIAGNOSIS — G459 Transient cerebral ischemic attack, unspecified: Secondary | ICD-10-CM

## 2021-04-25 DIAGNOSIS — R220 Localized swelling, mass and lump, head: Secondary | ICD-10-CM | POA: Diagnosis not present

## 2021-04-25 MED ORDER — GADOBENATE DIMEGLUMINE 529 MG/ML IV SOLN
12.0000 mL | Freq: Once | INTRAVENOUS | Status: AC | PRN
  Administered 2021-04-25: 12 mL via INTRAVENOUS

## 2021-04-28 ENCOUNTER — Other Ambulatory Visit: Payer: Self-pay | Admitting: Internal Medicine

## 2021-04-28 ENCOUNTER — Inpatient Hospital Stay (HOSPITAL_COMMUNITY): Admission: RE | Admit: 2021-04-28 | Payer: Medicare Other | Source: Ambulatory Visit

## 2021-04-28 DIAGNOSIS — G9520 Unspecified cord compression: Secondary | ICD-10-CM

## 2021-04-28 DIAGNOSIS — M069 Rheumatoid arthritis, unspecified: Secondary | ICD-10-CM

## 2021-04-30 ENCOUNTER — Ambulatory Visit (INDEPENDENT_AMBULATORY_CARE_PROVIDER_SITE_OTHER): Payer: Medicare Other | Admitting: Gynecology

## 2021-04-30 ENCOUNTER — Other Ambulatory Visit: Payer: Self-pay | Admitting: Internal Medicine

## 2021-04-30 ENCOUNTER — Other Ambulatory Visit: Payer: Self-pay

## 2021-04-30 DIAGNOSIS — M810 Age-related osteoporosis without current pathological fracture: Secondary | ICD-10-CM | POA: Diagnosis not present

## 2021-04-30 MED ORDER — DENOSUMAB 60 MG/ML ~~LOC~~ SOSY
60.0000 mg | PREFILLED_SYRINGE | Freq: Once | SUBCUTANEOUS | Status: AC
  Administered 2021-04-30: 60 mg via SUBCUTANEOUS

## 2021-04-30 NOTE — Progress Notes (Signed)
Patient has no clinical features of rheumatoid arthritis.  She does have osteoarthritis.  I would like to see Dr. Melven Sartorius opinion.  She has appointment with Dr. Vertell Limber on Monday.  I discussed MRI results with the patient.

## 2021-04-30 NOTE — Telephone Encounter (Signed)
Last RF per controlled substance database: 01/30/21 Last OV: 04/08/21 Next OV: 05/13/21

## 2021-05-03 ENCOUNTER — Other Ambulatory Visit: Payer: Self-pay | Admitting: Gastroenterology

## 2021-05-04 ENCOUNTER — Telehealth: Payer: Self-pay | Admitting: *Deleted

## 2021-05-04 DIAGNOSIS — M069 Rheumatoid arthritis, unspecified: Secondary | ICD-10-CM | POA: Diagnosis not present

## 2021-05-04 DIAGNOSIS — M542 Cervicalgia: Secondary | ICD-10-CM | POA: Diagnosis not present

## 2021-05-04 DIAGNOSIS — M47812 Spondylosis without myelopathy or radiculopathy, cervical region: Secondary | ICD-10-CM | POA: Diagnosis not present

## 2021-05-04 DIAGNOSIS — G9520 Unspecified cord compression: Secondary | ICD-10-CM

## 2021-05-04 DIAGNOSIS — R030 Elevated blood-pressure reading, without diagnosis of hypertension: Secondary | ICD-10-CM | POA: Diagnosis not present

## 2021-05-04 DIAGNOSIS — M4312 Spondylolisthesis, cervical region: Secondary | ICD-10-CM | POA: Diagnosis not present

## 2021-05-04 HISTORY — DX: Unspecified cord compression: G95.20

## 2021-05-04 NOTE — Progress Notes (Signed)
I received a phone call from Dr. Vertell Limber.  He suggested that the best to option for [name] will be to have C1-C2 fusion and to leave the pannus alone.  Patient has no clinical features of rheumatoid arthritis.  I left a message on [name]'s answering machine for her to call me back.

## 2021-05-04 NOTE — Telephone Encounter (Signed)
Patient returned call to the office. She is requesting a return call.

## 2021-05-05 ENCOUNTER — Telehealth: Payer: Self-pay | Admitting: Internal Medicine

## 2021-05-05 NOTE — Telephone Encounter (Signed)
Patient is requesting to see PCP this week. She said that she received a message asking to come in sooner. Her appointment is 6/15. Can she be seen this week or is it okay to leave her scheduled for 6/15.  Please advise.

## 2021-05-05 NOTE — Telephone Encounter (Signed)
I returned patient's call and discussed that C1-C2 fusion will be the best option.  She was in agreement.

## 2021-05-06 NOTE — Telephone Encounter (Signed)
Called - we spoke w/Andretta about surgery

## 2021-05-08 ENCOUNTER — Other Ambulatory Visit: Payer: Self-pay | Admitting: Neurosurgery

## 2021-05-08 ENCOUNTER — Telehealth: Payer: Self-pay

## 2021-05-08 ENCOUNTER — Other Ambulatory Visit (HOSPITAL_COMMUNITY): Payer: Self-pay | Admitting: Neurosurgery

## 2021-05-08 DIAGNOSIS — M47812 Spondylosis without myelopathy or radiculopathy, cervical region: Secondary | ICD-10-CM

## 2021-05-08 NOTE — Telephone Encounter (Signed)
Patient called stating she spoke with Dr. Estanislado Pandy earlier this week regarding the surgery she is having with Dr. Vertell Limber.  Patient states she has a couple of "very important" additional questions and requested to speak with Dr. Estanislado Pandy directly ASAP.

## 2021-05-08 NOTE — Telephone Encounter (Signed)
I returned patient's call.  She is a scheduled to have surgery on next Friday.  Her question was regarding healing after surgery with the history of osteoporosis.  She is on subcutaneous Prolia for the treatment of osteoporosis.  Her T score was -2.7.  I discussed that osteoporosis does interfere with the healing.  However, as she is already on the treatment for osteoporosis she should not have that much of an issue.

## 2021-05-11 NOTE — H&P (Signed)
Patient ID:   [id number] Patient: [name]  Date of Birth: Oct 24, 1936 Visit Type: Office Visit   Date: 05/04/2021 11:45 AM Provider: Marchia Meiers. Vertell Limber MD   This 85 year old female presents for neck pain.  HISTORY OF PRESENT ILLNESS: 1.  neck pain  Patient returns for review of brain MRI ordered by her PCP after a fall.  She reports no new weakness.  She continues to enjoy some pain relief affects of cortisone injections from Dr. Estanislado Pandy 2 months ago.  MRI brain, CT brain on canopy Carotid ultrasound clear bilaterally per patient  The patient's new MRI of the cervical spine demonstrates a large pannus compressing and kinking of the upper cervical spinal cord.  The posteriorly projecting cyst at the level of C1 has in fact included to a slight degree but the overall compression of the spinal cord is increased.  Cervical radiographs appear to show less of a pronounced at [facility] to dental interval with flexion but on my review of her head CT there appears to be erosion of the anterior and posterior arches of C1.  This is why this is much more poorly visualized on plain radiographs and it was previously.  The new MRI was obtained by Dr. Alain Marion after the patient had a left frontal head injury due to loss of balance in which she fell.  After I met with the patient and her son in great detail, greater than 45 minutes in direct patient conversation and examination, I called Dr. August Luz to review her situation.  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  She believes that the patient should undergo surgical stabilization of the C1-2 level.  I am concerned about the degree of cord compression and also think that the patient would likely benefit from stabilization of the C1-2 level with a C1 laminectomy.  We will also resect the cyst.  I would like to obtain a CT scan of her cervical spine to better clarify the exact local bony  anatomy prior to proceeding with surgery.  The patient wants to discuss the situation with her husband and with her son, who was in attendance during this meeting.      Medical/Surgical/Interim History Reviewed, no change.     PAST MEDICAL HISTORY, SURGICAL HISTORY, FAMILY HISTORY, SOCIAL HISTORY AND REVIEW OF SYSTEMS I have reviewed the patient's past medical, surgical, family and social history as well as the comprehensive review of systems as included on the [facility] history form dated 07/14/2020, which I have signed.  Family History: Reviewed, no changes.    Social History: Reviewed, no changes.   MEDICATIONS: (added, continued or stopped this visit) Started Medication Directions Instruction Stopped  Dexilant 60 mg capsule, delayed release take 1 capsule by oral route  every day for 8 weeks    Dymista 137 mcg-50 mcg/spray nasal spray spray 1 spray by intranasal route 2 times every bedtime in each nostril    ipratropium bromide 42 mcg (0.06 %) nasal spray spray 2 spray by intranasal route 3 times every day in each nostril    lorazepam 1 mg tablet take 1 tablet by oral route 2 times every day    Nasacort 55 mcg nasal spray aerosol as directed    Pepcid 20 mg tablet take 1 tablet by oral route  every day    Singulair 10 mg tablet take 1 tablet by oral route  every day  in the evening    Synthroid 50 mcg tablet take 1 tablet by oral route  every day    tramadol 50 mg tablet     Tylenol Extra Strength 500 mg tablet     Vesicare 10 mg tablet take 1 tablet by oral route  every day    Voltaren Arthritis Pain 1 % topical gel     Xyzal take 1 tablet by oral route  every day in the evening    Zocor 40 mg tablet take 1 tablet by oral route  every day in the evening      ALLERGIES: Ingredient Reaction Medication Name Comment BACITRACIN  Neosporin (neo-bac-polym)  BACITRACIN  ZINC  Neosporin (neo-bac-polym)  POLYMYXIN B  Neosporin (neo-bac-polym)  TETRACYCLINE    DOXYCYCLINE    NEOMYCIN SULFATE  Neosporin (neo-bac-polym)   Reviewed, no changes.    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  Cyst formation and erosion of the C1 level.  PLAN: Posterior stabilization C1-2 surgically after obtaining cervical CT scan with C1 laminectomy.  My expectation is that by stabilizing the C1-2 level we will see diminution or involution of the pannus and that there will not be significant cord compression at this level.  My concern is that without going ahead with surgery the likelihood is that instability and cord compression will worsen and this could be life threatening.  Orders: Diagnostic Procedures: Assessment Procedure H70.263 CT C-Spine W/o Contrast Instruction(s)/Education: Assessment Instruction R03.0 Lifestyle education  Completed Orders (this encounter) Order Details Reason Side Interpretation Result Initial Treatment Date Region Lifestyle education Patient will monitor and contact primary care physician if needed.        Assessment/Plan  # Detail Type Description  1. Assessment Cervicalgia (M54.2).     2. Assessment Rheumatoid arthritis involving multiple sites, unspecified whether rheumatoid factor present (M06.9).     3. Assessment Cervical spondylosis (M47.812).     4. Assessment Cervical spinal cord compression (G95.20).     5. Assessment Spondylolisthesis, cervical region (M43.12).     6. Assessment Elevated blood-pressure reading, w/o diagnosis of htn (R03.0).       Pain Management Plan Pain Scale: 6/10. Method: Numeric Pain Intensity Scale. Location: neck. Onset:  11/29/2018. Duration: varies. Quality: discomforting. Pain management follow-up plan of care: Patient will continue medication management..              Provider:  Marchia Meiers. Vertell Limber MD  05/08/2021 11:04 AM    Dictation edited by: Marchia Meiers. [facility]    CC Providers: [facility] [address] [place],  [place]  [address]-   Aleksei Plotnikov  [place], [place] [address]-               Electronically signed by Marchia Meiers Vertell Limber MD on 05/08/2021 11:04 AM

## 2021-05-12 ENCOUNTER — Ambulatory Visit (HOSPITAL_BASED_OUTPATIENT_CLINIC_OR_DEPARTMENT_OTHER)
Admission: RE | Admit: 2021-05-12 | Discharge: 2021-05-12 | Disposition: A | Discharge status: Home / Self Care | Payer: Medicare Other | Source: Ambulatory Visit | Attending: Neurosurgery | Admitting: Neurosurgery

## 2021-05-12 ENCOUNTER — Other Ambulatory Visit: Payer: Self-pay

## 2021-05-12 DIAGNOSIS — M47812 Spondylosis without myelopathy or radiculopathy, cervical region: Secondary | ICD-10-CM | POA: Insufficient documentation

## 2021-05-13 ENCOUNTER — Encounter (HOSPITAL_COMMUNITY): Payer: Self-pay

## 2021-05-13 ENCOUNTER — Ambulatory Visit (INDEPENDENT_AMBULATORY_CARE_PROVIDER_SITE_OTHER): Payer: Medicare Other | Admitting: Internal Medicine

## 2021-05-13 ENCOUNTER — Encounter: Payer: Self-pay | Admitting: Internal Medicine

## 2021-05-13 ENCOUNTER — Encounter (HOSPITAL_COMMUNITY)
Admission: RE | Admit: 2021-05-13 | Discharge: 2021-05-13 | Disposition: A | Discharge status: Home / Self Care | Payer: Medicare Other | Source: Ambulatory Visit | Attending: Neurosurgery | Admitting: Neurosurgery

## 2021-05-13 DIAGNOSIS — G459 Transient cerebral ischemic attack, unspecified: Secondary | ICD-10-CM | POA: Diagnosis not present

## 2021-05-13 DIAGNOSIS — R030 Elevated blood-pressure reading, without diagnosis of hypertension: Secondary | ICD-10-CM | POA: Diagnosis not present

## 2021-05-13 DIAGNOSIS — M47812 Spondylosis without myelopathy or radiculopathy, cervical region: Secondary | ICD-10-CM | POA: Diagnosis not present

## 2021-05-13 DIAGNOSIS — Z881 Allergy status to other antibiotic agents status: Secondary | ICD-10-CM | POA: Diagnosis not present

## 2021-05-13 DIAGNOSIS — S12000A Unspecified displaced fracture of first cervical vertebra, initial encounter for closed fracture: Secondary | ICD-10-CM | POA: Diagnosis not present

## 2021-05-13 DIAGNOSIS — M4722 Other spondylosis with radiculopathy, cervical region: Secondary | ICD-10-CM | POA: Diagnosis not present

## 2021-05-13 DIAGNOSIS — G4700 Insomnia, unspecified: Secondary | ICD-10-CM

## 2021-05-13 DIAGNOSIS — M0579 Rheumatoid arthritis with rheumatoid factor of multiple sites without organ or systems involvement: Secondary | ICD-10-CM

## 2021-05-13 DIAGNOSIS — Z888 Allergy status to other drugs, medicaments and biological substances status: Secondary | ICD-10-CM | POA: Diagnosis not present

## 2021-05-13 DIAGNOSIS — M542 Cervicalgia: Secondary | ICD-10-CM | POA: Diagnosis not present

## 2021-05-13 DIAGNOSIS — D329 Benign neoplasm of meninges, unspecified: Secondary | ICD-10-CM | POA: Diagnosis not present

## 2021-05-13 DIAGNOSIS — M532X2 Spinal instabilities, cervical region: Secondary | ICD-10-CM

## 2021-05-13 DIAGNOSIS — G9520 Unspecified cord compression: Secondary | ICD-10-CM | POA: Diagnosis not present

## 2021-05-13 DIAGNOSIS — M4312 Spondylolisthesis, cervical region: Secondary | ICD-10-CM | POA: Diagnosis not present

## 2021-05-13 DIAGNOSIS — Z01818 Encounter for other preprocedural examination: Secondary | ICD-10-CM | POA: Insufficient documentation

## 2021-05-13 DIAGNOSIS — M4802 Spinal stenosis, cervical region: Secondary | ICD-10-CM | POA: Diagnosis not present

## 2021-05-13 DIAGNOSIS — Z79890 Hormone replacement therapy (postmenopausal): Secondary | ICD-10-CM | POA: Diagnosis not present

## 2021-05-13 DIAGNOSIS — Z20822 Contact with and (suspected) exposure to covid-19: Secondary | ICD-10-CM | POA: Insufficient documentation

## 2021-05-13 DIAGNOSIS — M0609 Rheumatoid arthritis without rheumatoid factor, multiple sites: Secondary | ICD-10-CM | POA: Diagnosis not present

## 2021-05-13 DIAGNOSIS — Z79899 Other long term (current) drug therapy: Secondary | ICD-10-CM | POA: Diagnosis not present

## 2021-05-13 DIAGNOSIS — S12001K Unspecified nondisplaced fracture of first cervical vertebra, subsequent encounter for fracture with nonunion: Secondary | ICD-10-CM | POA: Insufficient documentation

## 2021-05-13 HISTORY — DX: Unspecified asthma, uncomplicated: J45.909

## 2021-05-13 HISTORY — DX: Hypothyroidism, unspecified: E03.9

## 2021-05-13 LAB — CBC
HCT: 37.5 % (ref 36.0–46.0)
Hemoglobin: 12.6 g/dL (ref 12.0–15.0)
MCH: 32.6 pg (ref 26.0–34.0)
MCHC: 33.6 g/dL (ref 30.0–36.0)
MCV: 97.2 fL (ref 80.0–100.0)
Platelets: 245 10*3/uL (ref 150–400)
RBC: 3.86 MIL/uL — ABNORMAL LOW (ref 3.87–5.11)
RDW: 12.7 % (ref 11.5–15.5)
WBC: 7.4 10*3/uL (ref 4.0–10.5)
nRBC: 0 % (ref 0.0–0.2)

## 2021-05-13 LAB — SURGICAL PCR SCREEN
MRSA, PCR: NEGATIVE
Staphylococcus aureus: NEGATIVE

## 2021-05-13 LAB — BASIC METABOLIC PANEL
Anion gap: 7 (ref 5–15)
BUN: 15 mg/dL (ref 8–23)
CO2: 26 mmol/L (ref 22–32)
Calcium: 9.1 mg/dL (ref 8.9–10.3)
Chloride: 102 mmol/L (ref 98–111)
Creatinine, Ser: 0.67 mg/dL (ref 0.44–1.00)
GFR, Estimated: >60 mL/min (ref >60)
Glucose, Bld: 93 mg/dL (ref 70–99)
Potassium: 4.1 mmol/L (ref 3.5–5.1)
Sodium: 135 mmol/L (ref 135–145)

## 2021-05-13 LAB — TYPE AND SCREEN
ABO/RH(D): A POS
Antibody Screen: NEGATIVE

## 2021-05-13 LAB — SARS CORONAVIRUS 2 (TAT 6-24 HRS): SARS Coronavirus 2: NEGATIVE

## 2021-05-13 NOTE — Patient Instructions (Signed)
Sign up for [employer] library ( via Libby app on your phone or your ipad). If you don't have a library card  - go to any library branch. They will set you up in 15 minutes. It is free. You can check out books to read and to listen, check out magazines and newspapers, movies etc.   

## 2021-05-13 NOTE — Progress Notes (Signed)
Subjective:  Patient ID: [name], female    DOB: 29-Jun-1936  Age: 85 y.o. MRN: [id number]  CC: Follow-up (F/U on MRI)   HPI [name] presents for RA complication-she would require a C-spine surgery by Dr. Vertell Limber.  She is quite anxious about it. Pt had a recent C-spine CT by Dr. Vertell Limber: CT IMPRESSION: 1. Fracture of the anterior arch of C1 with 12 mm separation. Nondisplaced fracture posterior arch of C1. There is lateral subluxation of the lateral mass of C1 on C2 bilaterally. No other cervical spine fracture 2. Advanced multilevel spondylosis causing spinal and foraminal stenosis due to spurring as above.   Outpatient Medications Prior to Visit  Medication Sig Dispense Refill   Acetaminophen (TYLENOL PO) Take by mouth 3 (three) times daily.      acetaminophen (TYLENOL) 500 MG tablet Take 1 by mouth three times a day     aspirin 81 MG EC tablet Take 1 tablet (81 mg total) by mouth daily. Swallow whole. 30 tablet 12   azelastine (ASTELIN) 0.1 % nasal spray Place into both nostrils at bedtime. Use in each nostril as directed     Calcium Carbonate-Vitamin D (CALCIUM-VITAMIN D) 500-200 MG-UNIT per tablet Take 3 tablets by mouth daily.     Cholecalciferol 4000 units TABS Take 1 tablet by mouth daily.     desonide (DESOWEN) 0.05 % cream Apply 1 application topically as needed.     diclofenac Sodium (VOLTAREN) 1 % GEL APPLY 4 GRAMS 4 TIMES DAILY. 200 g 3   EPINEPHrine (EPIPEN 2-PAK) 0.3 mg/0.3 mL IJ SOAJ injection Inject 0.3 mLs (0.3 mg total) into the muscle as needed. 1 Device 1   estradiol (ESTRACE) 0.1 MG/GM vaginal cream Place vaginally.     famotidine (PEPCID) 40 MG tablet TAKE ONE TABLET BY MOUTH AT BEDTIME 30 tablet 0   ipratropium (ATROVENT) 0.06 % nasal spray Place 1 spray into the nose 3 (three) times daily as needed for rhinitis. 15 mL 0   levocetirizine (XYZAL) 5 MG tablet Take 5 mg by mouth every evening.     LORazepam (ATIVAN) 1 MG tablet TAKE 2-3 TABLETS AT BEDTIME AND 1-2  TABLETS IN THE DAYTIME AS NEEDED. 150 tablet 1   montelukast (SINGULAIR) 10 MG tablet Take 1 tablet (10 mg total) by mouth at bedtime. 90 tablet 1   Nirmatrelvir & Ritonavir (PAXLOVID) 20 x 150 MG & 10 x 100MG  TBPK Take 3 tablets by mouth in the morning and at bedtime. As directed on a package. Hold simvastatin for 5 days 30 tablet 0   NONFORMULARY OR COMPOUNDED ITEM Estradiol 0.02% vaginal cream S:Insert 108ml vaginally twice a week. 90 each 3   nystatin-triamcinolone ointment (MYCOLOG) Apply 1 application topically as needed.     pantoprazole (PROTONIX) 40 MG tablet TAKE (1) TABLET TWICE A DAY BEFORE MEALS. 60 tablet 5   prednisoLONE acetate (PRED FORTE) 1 % ophthalmic suspension INSTILL ONE DROP IN EACH EYE 4 TIMES A DAY FOR 5 DAYS     SALINE NASAL SPRAY NA Place into the nose as needed.      simvastatin (ZOCOR) 40 MG tablet TAKE 1 TABLET ONCE DAILY. 90 tablet 3   SYNTHROID 50 MCG tablet TAKE 1 TABLET ONCE DAILY BEFORE BREAKFAST. 30 tablet 5   traMADol (ULTRAM) 50 MG tablet TAKE 1 TABLET EVERY 6 HOURS AS NEEDED FOR MODERATE TO SEVERE PAIN. 120 tablet 3   triamcinolone cream (KENALOG) 0.1 % Apply 1 application topically 3 (three) times daily as  needed. 80 g 2   VESICARE 10 MG tablet Take 1 tablet by mouth as needed.      No facility-administered medications prior to visit.    ROS: Review of Systems  Constitutional:  Negative for activity change, appetite change, chills, fatigue and unexpected weight change.  HENT:  Negative for congestion, mouth sores and sinus pressure.   Eyes:  Negative for visual disturbance.  Respiratory:  Negative for cough and chest tightness.   Gastrointestinal:  Negative for abdominal pain and nausea.  Genitourinary:  Negative for difficulty urinating, frequency and vaginal pain.  Musculoskeletal:  Positive for arthralgias, neck pain and neck stiffness. Negative for back pain and gait problem.  Skin:  Negative for pallor and rash.  Neurological:  Negative for  dizziness, tremors, weakness, numbness and headaches.  Psychiatric/Behavioral:  Positive for sleep disturbance. Negative for confusion and suicidal ideas. The patient is nervous/anxious.    Objective:  BP (!) 141/78 (BP Location: Left Arm)   Pulse 90   Temp 98.3 F (36.8 C) (Oral)   SpO2 95%   BP Readings from Last 3 Encounters:  05/13/21 (!) 141/78  04/08/21 130/78  03/25/21 (!) 152/70    Wt Readings from Last 3 Encounters:  03/04/21 129 lb (58.5 kg)  02/02/21 122 lb 9.6 oz (55.6 kg)  12/24/20 120 lb 12.8 oz (54.8 kg)    Physical Exam Constitutional:      General: She is not in acute distress.    Appearance: She is well-developed.  HENT:     Head: Normocephalic.     Right Ear: External ear normal.     Left Ear: External ear normal.     Nose: Nose normal.  Eyes:     General:        Right eye: No discharge.        Left eye: No discharge.     Conjunctiva/sclera: Conjunctivae normal.     Pupils: Pupils are equal, round, and reactive to light.  Neck:     Thyroid: No thyromegaly.     Vascular: No JVD.     Trachea: No tracheal deviation.  Cardiovascular:     Rate and Rhythm: Normal rate and regular rhythm.     Heart sounds: Normal heart sounds.  Pulmonary:     Effort: No respiratory distress.     Breath sounds: No stridor. No wheezing.  Abdominal:     General: Bowel sounds are normal. There is no distension.     Palpations: Abdomen is soft. There is no mass.     Tenderness: There is no abdominal tenderness. There is no guarding or rebound.  Musculoskeletal:        General: No tenderness.     Cervical back: Normal range of motion and neck supple. No rigidity.  Lymphadenopathy:     Cervical: No cervical adenopathy.  Skin:    Findings: No erythema or rash.  Neurological:     Cranial Nerves: No cranial nerve deficit.     Motor: No weakness or abnormal muscle tone.     Coordination: Coordination normal.     Deep Tendon Reflexes: Reflexes normal.  Psychiatric:         Behavior: Behavior normal.        Thought Content: Thought content normal.        Judgment: Judgment normal.   Marliyah is using a soft cervical collar Lab Results  Component Value Date   WBC 5.3 09/18/2020   HGB 12.6 09/18/2020   HCT 37.1 09/18/2020  PLT 267.0 09/18/2020   GLUCOSE 94 03/23/2021   CHOL 200 03/23/2021   TRIG 187.0 (H) 03/23/2021   HDL 54.60 03/23/2021   LDLDIRECT 93.0 09/18/2020   LDLCALC 108 (H) 03/23/2021   ALT 34 03/23/2021   AST 21 03/23/2021   NA 137 03/23/2021   K 4.1 03/23/2021   CL 102 03/23/2021   CREATININE 0.81 03/23/2021   BUN 25 (H) 03/23/2021   CO2 25 03/23/2021   TSH 1.89 12/19/2020   HGBA1C 5.5 06/13/2020    CT CERVICAL SPINE WO CONTRAST  Result Date: 05/12/2021 CLINICAL DATA:  Preop cervical spine surgery C1-2. EXAM: CT CERVICAL SPINE WITHOUT CONTRAST TECHNIQUE: Multidetector CT imaging of the cervical spine was performed without intravenous contrast. Multiplanar CT image reconstructions were also generated. COMPARISON:  MRI cervical spine 07/07/2020 FINDINGS: Alignment: Fracture of the anterior and posterior arch of C1. There is lateral subluxation of the lateral mass of C1 bilaterally due to C1 fracture. 2 mm anterolisthesis C2-3.  3 mm anterolisthesis C3-4. Skull base and vertebrae: Fracture of the anterior arch of C1, with 12 mm of separation. Nondisplaced fracture of the posterior arch of C1 No other cervical spine fracture identified. Soft tissues and spinal canal: No paraspinous mass or adenopathy. Disc levels: C1-2: Fracture of the anterior and posterior arch of C1. There is degenerative change at C1-2 with surrounding soft tissue swelling around the dens and posterior pannus formation. Mild spinal stenosis. C2-3: Mild disc and mild facet degeneration. No significant stenosis C3-4: Advanced disc degeneration with disc space narrowing and uncinate spurring. Mild foraminal narrowing bilaterally. C4-5: Disc degeneration with prominent posterior  osteophyte formation causing moderate spinal stenosis. Severe right foraminal stenosis due to spurring. Left foramen patent C5-6: Disc degeneration with diffuse uncinate spurring. Moderate right foraminal stenosis. Mild spinal stenosis. C6-7: Disc degeneration with diffuse uncinate spurring. Mild spinal stenosis and moderate foraminal stenosis bilaterally C7-T1: Disc degeneration with diffuse uncinate spurring. Mild to moderate foraminal stenosis bilaterally. Upper chest: Lung apices clear bilaterally. Apical pleural scarring and calcification bilaterally. Other: None IMPRESSION: 1. Fracture of the anterior arch of C1 with 12 mm separation. Nondisplaced fracture posterior arch of C1. There is lateral subluxation of the lateral mass of C1 on C2 bilaterally. No other cervical spine fracture 2. Advanced multilevel spondylosis causing spinal and foraminal stenosis due to spurring as above. Electronically Signed   By: Franchot Gallo M.D.   On: 05/12/2021 13:41    Assessment & Plan:     Walker Kehr, MD

## 2021-05-13 NOTE — Pre-Procedure Instructions (Addendum)
[name]  05/13/2021     Your procedure is scheduled on Fri., May 15, 2021 at 12:24PM.  Report to [facility] Entrance "A" at 10:20AM  Call this number if you have problems the morning of surgery:  [phone]   Remember:  Do not eat or drink after midnight on June 16th    Take these medicines the morning of surgery with A SIP OF WATER: Pantoprazole (PROTONIX)  Simvastatin (ZOCOR)  SYNTHROID Ipratropium (ATROVENT) nasal spray  If Needed: Acetaminophen (TYLENOL PO) EPINEPHrine (EPIPEN) Pantoprazole (PROTONIX)  Triamcinolone (NASACORT ALLERGY 24HR) TraMADol (ULTRAM) VESICARE   As of today, STOP taking all Aspirin (unless instructed by your doctor) and Other Aspirin containing products, Vitamins, Fish oils, and Herbal medications. Also stop all NSAIDS i.e. Advil, Ibuprofen, Motrin, Aleve, Anaprox, Naproxen, BC, Goody Powders, and all Supplements.   No Smoking of any kind, Tobacco/Vaping, or Alcohol products 24 hours prior to your procedure. If you use a Cpap at night, you may bring all equipment for your overnight stay.    Day of Surgery:  Do not wear jewelry, make-up. Do Not wear nail polish, gel polish, artificial nails, or any other type of covering on  natural nails including finger and toenails. If patients have artificial nails, gel coating, etc. that need to be removed by a nail salon please have this removed prior to surgery or surgery may need to be canceled/delayed if the surgeon/ anesthesia feels like the patient is unable to be adequately monitored.  Do not wear lotions, powders, or perfumes, or deodorant.  Do not shave 48 hours prior to surgery.    Do not bring valuables to the hospital.  [facility] is not responsible for any belongings or valuables.  Contacts, dentures or bridgework may not be worn into surgery.    For patients admitted to the hospital, discharge time will be determined by your treatment team.  Patients discharged the day of  surgery will not be allowed to drive home, and someone age 83 and over needs to stay with them for 24 hours.   Special instructions:  [facility]- Preparing For Surgery  Oral Hygiene is also important to reduce your risk of infection.  Remember - BRUSH YOUR TEETH THE MORNING OF SURGERY WITH YOUR REGULAR TOOTHPASTE  Before surgery, you can play an important role. Because skin is not sterile, your skin needs to be as free of germs as possible. You can reduce the number of germs on your skin by washing with CHG (chlorahexidine gluconate) Soap before surgery.  CHG is an antiseptic cleaner which kills germs and bonds with the skin to continue killing germs even after washing.    Please do not use if you have an allergy to CHG or antibacterial soaps. If your skin becomes reddened/irritated stop using the CHG.  Do not shave (including legs and underarms) for at least 48 hours prior to first CHG shower. It is OK to shave your face.  Please follow these instructions carefully.   Shower the NIGHT BEFORE SURGERY and the MORNING OF SURGERY with CHG.   If you chose to wash your hair, wash your hair first as usual with your normal shampoo.  After you shampoo, rinse your hair and body thoroughly to remove the shampoo.  Use CHG as you would any other liquid soap. You can apply CHG directly to the skin and wash gently with a scrungie or a clean washcloth.   Apply the CHG Soap to your body ONLY FROM  THE NECK DOWN.  Do not use on open wounds or open sores. Avoid contact with your eyes, ears, mouth and genitals (private parts). Wash Face and genitals (private parts)  with your normal soap.  Wash thoroughly, paying special attention to the area where your surgery will be performed.  Thoroughly rinse your body with warm water from the neck down.  DO NOT shower/wash with your normal soap after using and rinsing off the CHG Soap.  Pat yourself dry with a CLEAN TOWEL.  Wear CLEAN PAJAMAS to bed the night  before surgery, wear comfortable clothes the morning of surgery  Place CLEAN SHEETS on your bed the night of your first shower and DO NOT SLEEP WITH PETS.  Reminders: Do not apply any deodorants/lotions.  Please wear clean clothes to the hospital/surgery center.   Remember to brush your teeth WITH YOUR REGULAR TOOTHPASTE.  Please read over the following fact sheets that you were given.

## 2021-05-13 NOTE — Progress Notes (Signed)
PCP - Dr. Alain Marion  Cardiologist - Dr. Haroldine Laws  Chest x-ray - Denies  EKG - 05/13/21 (E)  Stress Test - Denies  ECHO - 02/02/21 (E)  Cardiac Cath - Denies  AICD-na PM-na LOOP-na  Sleep Study - Denies CPAP - Denies  LABS- 05/13/21: CBC, BMP, T/S, PCR, COVID 05/15/21: 2nd T/S  ASA- LD- 2 weeks ago  ERAS- No  HA1C-Denies  Anesthesia- No  Pt denies having chest pain, sob, or fever at this time. All instructions explained to the pt, with a verbal understanding of the material. Pt agrees to go over the instructions while at home for a better understanding. Pt also instructed to practice social distancing and wear a mask if she has to go out after being tested for COVID-19. The opportunity to ask questions was provided.    Coronavirus Screening  Have you experienced the following symptoms:  Cough yes/no: No Fever (>100.4F)  yes/no: No Runny nose yes/no: No Sore throat yes/no: No Difficulty breathing/shortness of breath  yes/no: No  Have you or a family member traveled in the last 14 days and where? yes/no: No   If the patient indicates "YES" to the above questions, their PAT will be rescheduled to limit the exposure to others and, the surgeon will be notified. THE PATIENT WILL NEED TO BE ASYMPTOMATIC FOR 14 DAYS.   If the patient is not experiencing any of these symptoms, the PAT nurse will instruct them to NOT bring anyone with them to their appointment since they may have these symptoms or traveled as well.   Please remind your patients and families that hospital visitation restrictions are in effect and the importance of the restrictions.

## 2021-05-15 ENCOUNTER — Inpatient Hospital Stay (HOSPITAL_COMMUNITY)
Admission: RE | Admit: 2021-05-15 | Discharge: 2021-05-16 | DRG: 472 | Disposition: A | Discharge status: Home / Self Care | Payer: Medicare Other | Attending: Neurosurgery | Admitting: Neurosurgery

## 2021-05-15 ENCOUNTER — Inpatient Hospital Stay (HOSPITAL_COMMUNITY): Payer: Medicare Other | Admitting: Anesthesiology

## 2021-05-15 ENCOUNTER — Inpatient Hospital Stay (HOSPITAL_COMMUNITY): Payer: Medicare Other

## 2021-05-15 ENCOUNTER — Encounter (HOSPITAL_COMMUNITY)
Admission: RE | Disposition: A | Discharge status: Home / Self Care | Payer: Self-pay | Source: Home / Self Care | Attending: Neurosurgery

## 2021-05-15 ENCOUNTER — Other Ambulatory Visit: Payer: Self-pay

## 2021-05-15 ENCOUNTER — Encounter (HOSPITAL_COMMUNITY): Payer: Self-pay | Admitting: Neurosurgery

## 2021-05-15 DIAGNOSIS — G9529 Other cord compression: Secondary | ICD-10-CM | POA: Diagnosis not present

## 2021-05-15 DIAGNOSIS — Z881 Allergy status to other antibiotic agents status: Secondary | ICD-10-CM | POA: Diagnosis not present

## 2021-05-15 DIAGNOSIS — M4722 Other spondylosis with radiculopathy, cervical region: Secondary | ICD-10-CM | POA: Diagnosis not present

## 2021-05-15 DIAGNOSIS — Z79899 Other long term (current) drug therapy: Secondary | ICD-10-CM | POA: Diagnosis not present

## 2021-05-15 DIAGNOSIS — G9520 Unspecified cord compression: Secondary | ICD-10-CM | POA: Diagnosis present

## 2021-05-15 DIAGNOSIS — M4802 Spinal stenosis, cervical region: Principal | ICD-10-CM | POA: Diagnosis present

## 2021-05-15 DIAGNOSIS — M0609 Rheumatoid arthritis without rheumatoid factor, multiple sites: Secondary | ICD-10-CM | POA: Diagnosis present

## 2021-05-15 DIAGNOSIS — M532X2 Spinal instabilities, cervical region: Secondary | ICD-10-CM

## 2021-05-15 DIAGNOSIS — Z888 Allergy status to other drugs, medicaments and biological substances status: Secondary | ICD-10-CM | POA: Diagnosis not present

## 2021-05-15 DIAGNOSIS — Z981 Arthrodesis status: Secondary | ICD-10-CM | POA: Diagnosis not present

## 2021-05-15 DIAGNOSIS — W19XXXA Unspecified fall, initial encounter: Secondary | ICD-10-CM | POA: Diagnosis present

## 2021-05-15 DIAGNOSIS — S12090A Other displaced fracture of first cervical vertebra, initial encounter for closed fracture: Secondary | ICD-10-CM | POA: Diagnosis not present

## 2021-05-15 DIAGNOSIS — S12000A Unspecified displaced fracture of first cervical vertebra, initial encounter for closed fracture: Secondary | ICD-10-CM | POA: Diagnosis present

## 2021-05-15 DIAGNOSIS — I509 Heart failure, unspecified: Secondary | ICD-10-CM | POA: Diagnosis not present

## 2021-05-15 DIAGNOSIS — Z79890 Hormone replacement therapy (postmenopausal): Secondary | ICD-10-CM

## 2021-05-15 DIAGNOSIS — Z20822 Contact with and (suspected) exposure to covid-19: Secondary | ICD-10-CM | POA: Diagnosis present

## 2021-05-15 DIAGNOSIS — M5412 Radiculopathy, cervical region: Secondary | ICD-10-CM | POA: Diagnosis not present

## 2021-05-15 DIAGNOSIS — I110 Hypertensive heart disease with heart failure: Secondary | ICD-10-CM | POA: Diagnosis not present

## 2021-05-15 DIAGNOSIS — K219 Gastro-esophageal reflux disease without esophagitis: Secondary | ICD-10-CM | POA: Diagnosis not present

## 2021-05-15 DIAGNOSIS — Z419 Encounter for procedure for purposes other than remedying health state, unspecified: Secondary | ICD-10-CM

## 2021-05-15 DIAGNOSIS — M532X1 Spinal instabilities, occipito-atlanto-axial region: Secondary | ICD-10-CM | POA: Diagnosis not present

## 2021-05-15 DIAGNOSIS — G96198 Other disorders of meninges, not elsewhere classified: Secondary | ICD-10-CM | POA: Diagnosis not present

## 2021-05-15 HISTORY — DX: Spinal instabilities, cervical region: M53.2X2

## 2021-05-15 HISTORY — PX: POSTERIOR CERVICAL FUSION/FORAMINOTOMY: SHX5038

## 2021-05-15 LAB — ABO/RH: ABO/RH(D): A POS

## 2021-05-15 SURGERY — POSTERIOR CERVICAL FUSION/FORAMINOTOMY LEVEL 1
Anesthesia: General | Site: Spine Cervical

## 2021-05-15 MED ORDER — MENTHOL 3 MG MT LOZG: 1.0000 | LOZENGE | OROMUCOSAL | Status: DC | PRN

## 2021-05-15 MED ORDER — CHLORHEXIDINE GLUCONATE CLOTH 2 % EX PADS: 6.0000 | MEDICATED_PAD | Freq: Once | CUTANEOUS | Status: DC

## 2021-05-15 MED ORDER — LORATADINE 10 MG PO TABS
10.0000 mg | ORAL_TABLET | Freq: Every day | ORAL | Status: DC
  Administered 2021-05-15: 10 mg via ORAL
  Filled 2021-05-15: qty 1

## 2021-05-15 MED ORDER — ZOLPIDEM TARTRATE 5 MG PO TABS: 5.0000 mg | ORAL_TABLET | Freq: Every evening | ORAL | Status: DC | PRN

## 2021-05-15 MED ORDER — ONDANSETRON HCL 4 MG/2ML IJ SOLN: 4.0000 mg | Freq: Four times a day (QID) | INTRAMUSCULAR | Status: DC | PRN

## 2021-05-15 MED ORDER — CEFAZOLIN SODIUM-DEXTROSE 2-4 GM/100ML-% IV SOLN: 2.0000 g | INTRAVENOUS | Status: DC

## 2021-05-15 MED ORDER — DOCUSATE SODIUM 100 MG PO CAPS
100.0000 mg | ORAL_CAPSULE | Freq: Two times a day (BID) | ORAL | Status: DC
  Administered 2021-05-15 – 2021-05-16 (×2): 100 mg via ORAL
  Filled 2021-05-15 (×2): qty 1

## 2021-05-15 MED ORDER — OXYCODONE HCL 5 MG PO TABS
5.0000 mg | ORAL_TABLET | ORAL | Status: DC | PRN
  Administered 2021-05-15 (×2): 5 mg via ORAL
  Filled 2021-05-15 (×2): qty 1

## 2021-05-15 MED ORDER — SODIUM CHLORIDE 0.9 % IV SOLN: 250.0000 mL | INTRAVENOUS | Status: DC

## 2021-05-15 MED ORDER — ACETAMINOPHEN 10 MG/ML IV SOLN
INTRAVENOUS | Status: DC | PRN
  Administered 2021-05-15: 1000 mg via INTRAVENOUS

## 2021-05-15 MED ORDER — THROMBIN 5000 UNITS EX SOLR: OROMUCOSAL | Status: DC | PRN

## 2021-05-15 MED ORDER — ONDANSETRON HCL 4 MG PO TABS: 4.0000 mg | ORAL_TABLET | Freq: Four times a day (QID) | ORAL | Status: DC | PRN

## 2021-05-15 MED ORDER — KETOROLAC TROMETHAMINE 15 MG/ML IJ SOLN
7.5000 mg | Freq: Four times a day (QID) | INTRAMUSCULAR | Status: AC
  Administered 2021-05-15 – 2021-05-16 (×3): 7.5 mg via INTRAVENOUS
  Filled 2021-05-15 (×3): qty 1

## 2021-05-15 MED ORDER — BACITRACIN ZINC 500 UNIT/GM EX OINT
TOPICAL_OINTMENT | CUTANEOUS | Status: DC | PRN
  Administered 2021-05-15: 1 via TOPICAL

## 2021-05-15 MED ORDER — 0.9 % SODIUM CHLORIDE (POUR BTL) OPTIME
TOPICAL | Status: DC | PRN
  Administered 2021-05-15: 1000 mL

## 2021-05-15 MED ORDER — DEXAMETHASONE SODIUM PHOSPHATE 4 MG/ML IJ SOLN
4.0000 mg | Freq: Four times a day (QID) | INTRAMUSCULAR | Status: DC
  Administered 2021-05-16 (×2): 4 mg via INTRAVENOUS
  Filled 2021-05-15 (×3): qty 1

## 2021-05-15 MED ORDER — PANTOPRAZOLE SODIUM 40 MG IV SOLR
40.0000 mg | Freq: Every day | INTRAVENOUS | Status: DC
  Administered 2021-05-15: 40 mg via INTRAVENOUS
  Filled 2021-05-15: qty 40

## 2021-05-15 MED ORDER — THROMBIN 5000 UNITS EX SOLR
CUTANEOUS | Status: DC | PRN
  Administered 2021-05-15 (×2): 5000 [IU] via TOPICAL

## 2021-05-15 MED ORDER — DICLOFENAC SODIUM 1 % EX GEL
4.0000 g | Freq: Four times a day (QID) | CUTANEOUS | Status: DC
  Filled 2021-05-15: qty 100

## 2021-05-15 MED ORDER — ALBUMIN HUMAN 5 % IV SOLN: INTRAVENOUS | Status: DC | PRN

## 2021-05-15 MED ORDER — CHLORHEXIDINE GLUCONATE 0.12 % MT SOLN
OROMUCOSAL | Status: AC
  Filled 2021-05-15: qty 15

## 2021-05-15 MED ORDER — ACETAMINOPHEN 500 MG PO TABS: 500.0000 mg | ORAL_TABLET | Freq: Two times a day (BID) | ORAL | Status: DC | PRN

## 2021-05-15 MED ORDER — LIDOCAINE 2% (20 MG/ML) 5 ML SYRINGE
INTRAMUSCULAR | Status: DC | PRN
  Administered 2021-05-15: 40 mg via INTRAVENOUS

## 2021-05-15 MED ORDER — PHENYLEPHRINE 40 MCG/ML (10ML) SYRINGE FOR IV PUSH (FOR BLOOD PRESSURE SUPPORT)
PREFILLED_SYRINGE | INTRAVENOUS | Status: DC | PRN
  Administered 2021-05-15 (×2): 80 ug via INTRAVENOUS

## 2021-05-15 MED ORDER — LACTATED RINGERS IV SOLN: INTRAVENOUS | Status: DC

## 2021-05-15 MED ORDER — POLYETHYLENE GLYCOL 3350 17 G PO PACK: 17.0000 g | PACK | Freq: Every day | ORAL | Status: DC | PRN

## 2021-05-15 MED ORDER — HYDROCODONE-ACETAMINOPHEN 5-325 MG PO TABS
2.0000 | ORAL_TABLET | ORAL | Status: DC | PRN
  Administered 2021-05-15 – 2021-05-16 (×3): 2 via ORAL
  Filled 2021-05-15 (×3): qty 2

## 2021-05-15 MED ORDER — LIDOCAINE-EPINEPHRINE 1 %-1:100000 IJ SOLN
INTRAMUSCULAR | Status: AC
  Filled 2021-05-15: qty 1

## 2021-05-15 MED ORDER — LIDOCAINE-EPINEPHRINE 1 %-1:100000 IJ SOLN
INTRAMUSCULAR | Status: DC | PRN
  Administered 2021-05-15: 10 mL

## 2021-05-15 MED ORDER — DEXAMETHASONE SODIUM PHOSPHATE 10 MG/ML IJ SOLN
INTRAMUSCULAR | Status: DC | PRN
  Administered 2021-05-15: 10 mg via INTRAVENOUS

## 2021-05-15 MED ORDER — CALCIUM-VITAMIN D 500-200 MG-UNIT PO TABS: 3.0000 | ORAL_TABLET | Freq: Every day | ORAL | Status: DC

## 2021-05-15 MED ORDER — BISACODYL 10 MG RE SUPP: 10.0000 mg | Freq: Every day | RECTAL | Status: DC | PRN

## 2021-05-15 MED ORDER — DEXAMETHASONE 4 MG PO TABS
4.0000 mg | ORAL_TABLET | Freq: Four times a day (QID) | ORAL | Status: DC
  Administered 2021-05-15 – 2021-05-16 (×3): 4 mg via ORAL
  Filled 2021-05-15 (×3): qty 1

## 2021-05-15 MED ORDER — ROCURONIUM BROMIDE 10 MG/ML (PF) SYRINGE
PREFILLED_SYRINGE | INTRAVENOUS | Status: AC
  Filled 2021-05-15: qty 10

## 2021-05-15 MED ORDER — KCL IN DEXTROSE-NACL 20-5-0.45 MEQ/L-%-% IV SOLN
INTRAVENOUS | Status: DC
  Filled 2021-05-15: qty 1000

## 2021-05-15 MED ORDER — LORAZEPAM 2 MG PO TABS: 3.0000 mg | ORAL_TABLET | Freq: Every day | ORAL | Status: DC

## 2021-05-15 MED ORDER — FENTANYL CITRATE (PF) 100 MCG/2ML IJ SOLN
INTRAMUSCULAR | Status: AC
  Filled 2021-05-15: qty 2

## 2021-05-15 MED ORDER — METHOCARBAMOL 500 MG PO TABS: 500.0000 mg | ORAL_TABLET | Freq: Four times a day (QID) | ORAL | Status: DC | PRN

## 2021-05-15 MED ORDER — PHENOL 1.4 % MT LIQD: 1.0000 | OROMUCOSAL | Status: DC | PRN

## 2021-05-15 MED ORDER — PROPOFOL 10 MG/ML IV BOLUS
INTRAVENOUS | Status: DC | PRN
  Administered 2021-05-15: 130 mg via INTRAVENOUS
  Administered 2021-05-15 (×2): 20 mg via INTRAVENOUS

## 2021-05-15 MED ORDER — LIDOCAINE HCL (PF) 2 % IJ SOLN
INTRAMUSCULAR | Status: AC
  Filled 2021-05-15: qty 5

## 2021-05-15 MED ORDER — THROMBIN 5000 UNITS EX SOLR
CUTANEOUS | Status: AC
  Filled 2021-05-15: qty 10000

## 2021-05-15 MED ORDER — ONDANSETRON HCL 4 MG/2ML IJ SOLN
INTRAMUSCULAR | Status: DC | PRN
  Administered 2021-05-15: 4 mg via INTRAVENOUS

## 2021-05-15 MED ORDER — PROPOFOL 10 MG/ML IV BOLUS
INTRAVENOUS | Status: AC
  Filled 2021-05-15: qty 20

## 2021-05-15 MED ORDER — HYDROCORTISONE 1 % EX CREA: TOPICAL_CREAM | Freq: Four times a day (QID) | CUTANEOUS | Status: DC | PRN

## 2021-05-15 MED ORDER — ESTRADIOL 0.1 MG/GM VA CREA: 1.0000 | TOPICAL_CREAM | VAGINAL | Status: DC

## 2021-05-15 MED ORDER — TRIAMCINOLONE ACETONIDE 55 MCG/ACT NA AERO
2.0000 | INHALATION_SPRAY | Freq: Every day | NASAL | Status: DC
  Administered 2021-05-16: 2 via NASAL
  Filled 2021-05-15: qty 21.6

## 2021-05-15 MED ORDER — SIMVASTATIN 20 MG PO TABS
40.0000 mg | ORAL_TABLET | Freq: Every evening | ORAL | Status: DC
  Administered 2021-05-15: 40 mg via ORAL
  Filled 2021-05-15: qty 2

## 2021-05-15 MED ORDER — HYDROMORPHONE HCL 1 MG/ML IJ SOLN
0.5000 mg | INTRAMUSCULAR | Status: DC | PRN
  Administered 2021-05-15: 0.5 mg via INTRAVENOUS
  Filled 2021-05-15: qty 1

## 2021-05-15 MED ORDER — CEFAZOLIN SODIUM-DEXTROSE 2-4 GM/100ML-% IV SOLN
2.0000 g | Freq: Three times a day (TID) | INTRAVENOUS | Status: AC
  Administered 2021-05-15 (×2): 2 g via INTRAVENOUS
  Filled 2021-05-15 (×2): qty 100

## 2021-05-15 MED ORDER — BUPIVACAINE HCL (PF) 0.5 % IJ SOLN
INTRAMUSCULAR | Status: AC
  Filled 2021-05-15: qty 30

## 2021-05-15 MED ORDER — FAMOTIDINE 20 MG PO TABS
40.0000 mg | ORAL_TABLET | Freq: Every day | ORAL | Status: DC
  Administered 2021-05-15: 40 mg via ORAL
  Filled 2021-05-15: qty 2

## 2021-05-15 MED ORDER — AZELASTINE HCL 0.1 % NA SOLN
1.0000 | Freq: Every day | NASAL | Status: DC
  Administered 2021-05-15: 1 via NASAL
  Filled 2021-05-15: qty 30

## 2021-05-15 MED ORDER — TRAMADOL HCL 50 MG PO TABS: 50.0000 mg | ORAL_TABLET | Freq: Two times a day (BID) | ORAL | Status: DC | PRN

## 2021-05-15 MED ORDER — LORAZEPAM 1 MG PO TABS
3.0000 mg | ORAL_TABLET | Freq: Every day | ORAL | Status: DC
  Administered 2021-05-15: 3 mg via ORAL
  Filled 2021-05-15: qty 3

## 2021-05-15 MED ORDER — ROCURONIUM BROMIDE 10 MG/ML (PF) SYRINGE
PREFILLED_SYRINGE | INTRAVENOUS | Status: DC | PRN
  Administered 2021-05-15: 50 mg via INTRAVENOUS
  Administered 2021-05-15: 30 mg via INTRAVENOUS
  Administered 2021-05-15: 20 mg via INTRAVENOUS

## 2021-05-15 MED ORDER — BUPIVACAINE HCL (PF) 0.5 % IJ SOLN
INTRAMUSCULAR | Status: DC | PRN
  Administered 2021-05-15: 10 mL

## 2021-05-15 MED ORDER — LEVOCETIRIZINE DIHYDROCHLORIDE 5 MG PO TABS: 5.0000 mg | ORAL_TABLET | Freq: Every evening | ORAL | Status: DC

## 2021-05-15 MED ORDER — DEXAMETHASONE SODIUM PHOSPHATE 10 MG/ML IJ SOLN
INTRAMUSCULAR | Status: AC
  Filled 2021-05-15: qty 1

## 2021-05-15 MED ORDER — ONDANSETRON HCL 4 MG/2ML IJ SOLN: 4.0000 mg | Freq: Once | INTRAMUSCULAR | Status: DC | PRN

## 2021-05-15 MED ORDER — FENTANYL CITRATE (PF) 100 MCG/2ML IJ SOLN
25.0000 ug | INTRAMUSCULAR | Status: DC | PRN
  Administered 2021-05-15 (×2): 25 ug via INTRAVENOUS
  Administered 2021-05-15: 50 ug via INTRAVENOUS
  Administered 2021-05-15: 25 ug via INTRAVENOUS

## 2021-05-15 MED ORDER — DARIFENACIN HYDROBROMIDE ER 7.5 MG PO TB24
7.5000 mg | ORAL_TABLET | Freq: Every day | ORAL | Status: DC
  Filled 2021-05-15: qty 1

## 2021-05-15 MED ORDER — ORAL CARE MOUTH RINSE: 15.0000 mL | Freq: Once | OROMUCOSAL | Status: AC

## 2021-05-15 MED ORDER — EPINEPHRINE 0.3 MG/0.3ML IJ SOAJ: 0.3000 mg | INTRAMUSCULAR | Status: DC | PRN

## 2021-05-15 MED ORDER — CEFAZOLIN SODIUM-DEXTROSE 2-4 GM/100ML-% IV SOLN
INTRAVENOUS | Status: AC
  Filled 2021-05-15: qty 100

## 2021-05-15 MED ORDER — CALCIUM CARBONATE-VITAMIN D 500-200 MG-UNIT PO TABS
1.0000 | ORAL_TABLET | Freq: Every day | ORAL | Status: DC
  Filled 2021-05-15: qty 1

## 2021-05-15 MED ORDER — ACETAMINOPHEN 10 MG/ML IV SOLN
INTRAVENOUS | Status: AC
  Filled 2021-05-15: qty 100

## 2021-05-15 MED ORDER — HEMOSTATIC AGENTS (NO CHARGE) OPTIME
TOPICAL | Status: DC | PRN
  Administered 2021-05-15: 1 via TOPICAL

## 2021-05-15 MED ORDER — FENTANYL CITRATE (PF) 250 MCG/5ML IJ SOLN
INTRAMUSCULAR | Status: AC
  Filled 2021-05-15: qty 5

## 2021-05-15 MED ORDER — SODIUM CHLORIDE 0.9% FLUSH: 3.0000 mL | INTRAVENOUS | Status: DC | PRN

## 2021-05-15 MED ORDER — PHENYLEPHRINE HCL-NACL 10-0.9 MG/250ML-% IV SOLN
INTRAVENOUS | Status: DC | PRN
  Administered 2021-05-15: 25 ug/min via INTRAVENOUS

## 2021-05-15 MED ORDER — ACETAMINOPHEN 650 MG RE SUPP: 650.0000 mg | RECTAL | Status: DC | PRN

## 2021-05-15 MED ORDER — OXYCODONE HCL 5 MG/5ML PO SOLN: 5.0000 mg | Freq: Once | ORAL | Status: DC | PRN

## 2021-05-15 MED ORDER — ONDANSETRON HCL 4 MG/2ML IJ SOLN
INTRAMUSCULAR | Status: AC
  Filled 2021-05-15: qty 2

## 2021-05-15 MED ORDER — ALUM & MAG HYDROXIDE-SIMETH 200-200-20 MG/5ML PO SUSP: 30.0000 mL | Freq: Four times a day (QID) | ORAL | Status: DC | PRN

## 2021-05-15 MED ORDER — SODIUM CHLORIDE 0.9% FLUSH
3.0000 mL | Freq: Two times a day (BID) | INTRAVENOUS | Status: DC
  Administered 2021-05-15: 3 mL via INTRAVENOUS
  Administered 2021-05-15: 10 mL via INTRAVENOUS

## 2021-05-15 MED ORDER — FENTANYL CITRATE (PF) 250 MCG/5ML IJ SOLN
INTRAMUSCULAR | Status: DC | PRN
  Administered 2021-05-15 (×5): 50 ug via INTRAVENOUS

## 2021-05-15 MED ORDER — LEVOTHYROXINE SODIUM 50 MCG PO TABS
50.0000 ug | ORAL_TABLET | Freq: Every day | ORAL | Status: DC
  Administered 2021-05-16: 50 ug via ORAL
  Filled 2021-05-15: qty 1

## 2021-05-15 MED ORDER — FLEET ENEMA 7-19 GM/118ML RE ENEM: 1.0000 | ENEMA | Freq: Once | RECTAL | Status: DC | PRN

## 2021-05-15 MED ORDER — IPRATROPIUM BROMIDE 0.06 % NA SOLN
1.0000 | Freq: Three times a day (TID) | NASAL | Status: DC
  Administered 2021-05-15 – 2021-05-16 (×2): 1 via NASAL
  Filled 2021-05-15: qty 15

## 2021-05-15 MED ORDER — THROMBIN 5000 UNITS EX SOLR
CUTANEOUS | Status: AC
  Filled 2021-05-15: qty 5000

## 2021-05-15 MED ORDER — OXYCODONE HCL 5 MG PO TABS: 5.0000 mg | ORAL_TABLET | Freq: Once | ORAL | Status: DC | PRN

## 2021-05-15 MED ORDER — PHENYLEPHRINE 40 MCG/ML (10ML) SYRINGE FOR IV PUSH (FOR BLOOD PRESSURE SUPPORT)
PREFILLED_SYRINGE | INTRAVENOUS | Status: AC
  Filled 2021-05-15: qty 10

## 2021-05-15 MED ORDER — METHOCARBAMOL 1000 MG/10ML IJ SOLN
500.0000 mg | Freq: Four times a day (QID) | INTRAVENOUS | Status: DC | PRN
  Filled 2021-05-15: qty 5

## 2021-05-15 MED ORDER — PANTOPRAZOLE SODIUM 40 MG PO TBEC
40.0000 mg | DELAYED_RELEASE_TABLET | Freq: Two times a day (BID) | ORAL | Status: DC | PRN
  Administered 2021-05-15: 40 mg via ORAL
  Filled 2021-05-15: qty 1

## 2021-05-15 MED ORDER — CHLORHEXIDINE GLUCONATE 0.12 % MT SOLN
15.0000 mL | Freq: Once | OROMUCOSAL | Status: AC
  Administered 2021-05-15: 15 mL via OROMUCOSAL

## 2021-05-15 MED ORDER — BACITRACIN ZINC 500 UNIT/GM EX OINT
TOPICAL_OINTMENT | CUTANEOUS | Status: AC
  Filled 2021-05-15: qty 28.35

## 2021-05-15 MED ORDER — ACETAMINOPHEN 325 MG PO TABS
650.0000 mg | ORAL_TABLET | ORAL | Status: DC | PRN
Start: 2021-05-15 — End: 2021-05-16
  Administered 2021-05-16: 650 mg via ORAL
  Filled 2021-05-15: qty 2

## 2021-05-15 MED ORDER — LACTATED RINGERS IV SOLN: INTRAVENOUS | Status: DC | PRN

## 2021-05-15 MED ORDER — MONTELUKAST SODIUM 10 MG PO TABS
10.0000 mg | ORAL_TABLET | Freq: Every day | ORAL | Status: DC
  Administered 2021-05-15: 10 mg via ORAL
  Filled 2021-05-15: qty 1

## 2021-05-15 SURGICAL SUPPLY — 63 items
ADH SKN CLS APL DERMABOND .7 (GAUZE/BANDAGES/DRESSINGS) ×1
BIT DRILL NEURO 2X3.1 SFT TUCH (MISCELLANEOUS) IMPLANT
BLADE CLIPPER SURG (BLADE) ×1 IMPLANT
BUR PRECISION FLUTE 5.0 (BURR) IMPLANT
CANISTER SUCT 3000ML PPV (MISCELLANEOUS) ×2 IMPLANT
DERMABOND ADVANCED (GAUZE/BANDAGES/DRESSINGS) ×1
DERMABOND ADVANCED .7 DNX12 (GAUZE/BANDAGES/DRESSINGS) IMPLANT
DRAPE C-ARM 42X72 X-RAY (DRAPES) ×4 IMPLANT
DRAPE LAPAROTOMY 100X72 PEDS (DRAPES) ×2 IMPLANT
DRILL NEURO 2X3.1 SOFT TOUCH (MISCELLANEOUS)
DRSG OPSITE POSTOP 4X8 (GAUZE/BANDAGES/DRESSINGS) ×1 IMPLANT
DURAPREP 6ML APPLICATOR 50/CS (WOUND CARE) ×2 IMPLANT
ELECT BLADE 4.0 EZ CLEAN MEGAD (MISCELLANEOUS) ×2
ELECT REM PT RETURN 9FT ADLT (ELECTROSURGICAL) ×2
ELECTRODE BLDE 4.0 EZ CLN MEGD (MISCELLANEOUS) ×1 IMPLANT
ELECTRODE REM PT RTRN 9FT ADLT (ELECTROSURGICAL) ×1 IMPLANT
GAUZE 4X4 16PLY RFD (DISPOSABLE) IMPLANT
GAUZE SPONGE 4X4 12PLY STRL (GAUZE/BANDAGES/DRESSINGS) ×2 IMPLANT
GLOVE BIO SURGEON STRL SZ8 (GLOVE) ×3 IMPLANT
GLOVE BIOGEL PI IND STRL 8.5 (GLOVE) ×1 IMPLANT
GLOVE BIOGEL PI INDICATOR 8.5 (GLOVE) ×2
GLOVE ECLIPSE 8.0 STRL XLNG CF (GLOVE) ×3 IMPLANT
GLOVE SRG 8 PF TXTR STRL LF DI (GLOVE) ×1 IMPLANT
GLOVE SURG UNDER POLY LF SZ8 (GLOVE) ×2
GOWN STRL REUS W/ TWL LRG LVL3 (GOWN DISPOSABLE) IMPLANT
GOWN STRL REUS W/ TWL XL LVL3 (GOWN DISPOSABLE) IMPLANT
GOWN STRL REUS W/TWL 2XL LVL3 (GOWN DISPOSABLE) ×5 IMPLANT
GOWN STRL REUS W/TWL LRG LVL3 (GOWN DISPOSABLE)
GOWN STRL REUS W/TWL XL LVL3 (GOWN DISPOSABLE) ×8
HEMOSTAT POWDER KIT SURGIFOAM (HEMOSTASIS) ×1 IMPLANT
KIT BASIN OR (CUSTOM PROCEDURE TRAY) ×2 IMPLANT
KIT TURNOVER KIT B (KITS) ×2 IMPLANT
MARKER SKIN DUAL TIP RULER LAB (MISCELLANEOUS) ×4 IMPLANT
NDL HYPO 18GX1.5 BLUNT FILL (NEEDLE) IMPLANT
NDL HYPO 25X1 1.5 SAFETY (NEEDLE) ×1 IMPLANT
NDL SPNL 22GX3.5 QUINCKE BK (NEEDLE) ×1 IMPLANT
NEEDLE HYPO 18GX1.5 BLUNT FILL (NEEDLE) IMPLANT
NEEDLE HYPO 25X1 1.5 SAFETY (NEEDLE) ×2 IMPLANT
NEEDLE SPNL 22GX3.5 QUINCKE BK (NEEDLE) ×2 IMPLANT
NS IRRIG 1000ML POUR BTL (IV SOLUTION) ×2 IMPLANT
PACK LAMINECTOMY NEURO (CUSTOM PROCEDURE TRAY) ×2 IMPLANT
PIN MAYFIELD SKULL DISP (PIN) ×2 IMPLANT
PLATE KEEL VUEPOINTII ADJ (Plate) ×1 IMPLANT
PUTTY DBM PROPEL LRG (Putty) ×1 IMPLANT
ROD PRECURVED VPII 240MM-45 (Rod) ×1 IMPLANT
SCREW FA VUEPOINT 3.5X28 (Screw) ×1 IMPLANT
SCREW MA MM 3.5X14 (Screw) ×2 IMPLANT
SCREW MA PT VUEPOINT 3.5X34 (Screw) ×1 IMPLANT
SCREW OCC VUEPOINT 4.5X6 (Screw) ×1 IMPLANT
SCREW OCCIPITAL VPII 4.5X8MM (Screw) ×3 IMPLANT
SCREW SET THREADED (Screw) ×6 IMPLANT
SPONGE INTESTINAL PEANUT (DISPOSABLE) ×1 IMPLANT
SPONGE SURGIFOAM ABS GEL SZ50 (HEMOSTASIS) ×2 IMPLANT
STAPLER SKIN PROX WIDE 3.9 (STAPLE) ×2 IMPLANT
SUT ETHILON 3 0 FSL (SUTURE) ×2 IMPLANT
SUT VIC AB 0 CT1 18XCR BRD8 (SUTURE) ×1 IMPLANT
SUT VIC AB 0 CT1 8-18 (SUTURE) ×2
SUT VIC AB 2-0 CP2 18 (SUTURE) ×2 IMPLANT
SUT VIC AB 3-0 SH 8-18 (SUTURE) ×1 IMPLANT
TOWEL GREEN STERILE (TOWEL DISPOSABLE) ×2 IMPLANT
TOWEL GREEN STERILE FF (TOWEL DISPOSABLE) ×2 IMPLANT
TRAY FOLEY MTR SLVR 16FR STAT (SET/KITS/TRAYS/PACK) IMPLANT
WATER STERILE IRR 1000ML POUR (IV SOLUTION) ×2 IMPLANT

## 2021-05-15 NOTE — Op Note (Signed)
05/15/2021  3:04 PM  PATIENT:  [name]  85 y.o. 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, C 1 fracture, cervicalgia, cervical radiculopathy  POST-OPERATIVE DIAGNOSIS:  Cervical spinal cord compression with C 1 C 2 instability, C 1 fracture, cervicalgia, cervical radiculopathy  PROCEDURE:  Procedure(s): Cervical One Laminectomy with resection of cyst, Fixation from Occiput to Cervical Four (N/A) with occipital cervical fusion with navigation  SURGEON:  Surgeon(s) and Role:    Erline Levine, MD - Primary    * Dawley, Theodoro Doing, DO  PHYSICIAN ASSISTANT:   ASSISTANTS: Poteat, RN   ANESTHESIA:   general  EBL:  50 mL   BLOOD ADMINISTERED:none  DRAINS: none   LOCAL MEDICATIONS USED:  MARCAINE    and LIDOCAINE   SPECIMEN:  No Specimen  DISPOSITION OF SPECIMEN:  N/A  COUNTS:  YES  TOURNIQUET:  * No tourniquets in log *  DICTATION: DICTATION: DICTATION:   Indications:  Patient is a 85 year old woman with C 1 C 2 instability and pannus at C 1 C 2 causing cord compression with severe neck pain and C 1 - C 2 instability.  It was elected to take her to surgery for posterior reconstruction, hopefully with C 1 C 2 fusion, but if this wasn't possible, then fusion from Occiput through C 4 with instrumentation and with posterolateral arthrodesis and decompression of spinal cord.  Procedure:  After the smooth and uncomplicated induction of general endotracheal anesthesia with Glide scope, patient was placed in 3 pin fixation and placed prone on the [place] table.   Preoperative localizing C arm was used to confirm adequacy of positioning.  Scalp was shaved.    After prep and drape with betadine scrub and Dura prep, area of incision was infiltrated with lidocaine.  Incision was made from occiput to C 5  levels exposed and the fractured and highly eroded C 1 ring was also exposed.  Our initial plan was to place hardware at C 1 with C 1 C 2  fusion, but C 1 was extremely mobile and did not appear sufficiently stable to adequately fixate or place hardware.  We therefore elected to perform occiput to C 4 fusion.  A CT scan with O-arm was then performed with the array on C 6.  We then placed a pedicle screw at C 2 on the right (3.5 x 28 mm) and, because of the extremely small pedicle of C 2 on the left, we place a translaminar C 2 screw (3.5 x 34 mm).  We placed lateral mass screws bilaterally at C 4 (C 3 and C 4 were already auto-fused) with lateral mass screws (3.5 x 14 mm).   Occipital plate was fixated to occiput with three cortical screws in the midline (8 x 4.5 x 2 (upper and lower midline) and 6 x 4.5 mm x 1(middle midline) and lateral right (8 x 4.5 mm)).  Rods were cut and bent, then affixed to the screw heads. The C 1 lamina was then removed along with the cyst and total laminectomy was performed at this level with wide decompression of the upper cervical cord Dura.  The posterolateral region was decorticated  and packed with 10 cc DBM and local autograft.  Hemostasis was assured, then wounds were irrigated, and closed with interrupted vicryl sutures (0, 2-0, 3-0) to reapproximate the skin edges.  Sterile occlusive dressing was placed and patient was removed from pins and returned to the  OR stretcher.  Counts were correct at the end of the case.   PLAN OF CARE: Admit to inpatient   PATIENT DISPOSITION:  PACU - hemodynamically stable.   Delay start of Pharmacological VTE agent (>24hrs) due to surgical blood loss or risk of bleeding: yes

## 2021-05-15 NOTE — Interval H&P Note (Signed)
History and Physical Interval Note:  05/15/2021 7:27 AM  [name]  has presented today for surgery, with the diagnosis of Cervical spinal cord compression.  The various methods of treatment have been discussed with the patient and family. After consideration of risks, benefits and other options for treatment, the patient has consented to  Procedure(s) with comments: Cervical 1 Laminectomy with resection of cyst, Cervical 1-2 Fusion, POSS OCCIPT TO C4 FIXATION (N/A) - RM 21, NEURO ICU POST OP as a surgical intervention.  The patient's history has been reviewed, patient examined, no change in status, stable for surgery.  I have reviewed the patient's chart and labs.  Questions were answered to the patient's satisfaction.     Peggyann Shoals

## 2021-05-15 NOTE — Anesthesia Postprocedure Evaluation (Signed)
Anesthesia Post Note  Patient: [name]  Procedure(s) Performed: Cervical One Laminectomy with resection of cyst, Fixation from Occiput to Cervical Four (Spine Cervical)     Patient location during evaluation: PACU Anesthesia Type: General Level of consciousness: awake and alert Pain management: pain level controlled Vital Signs Assessment: post-procedure vital signs reviewed and stable Respiratory status: spontaneous breathing, nonlabored ventilation, respiratory function stable and patient connected to nasal cannula oxygen Cardiovascular status: blood pressure returned to baseline and stable Postop Assessment: no apparent nausea or vomiting Anesthetic complications: no   No notable events documented.  Last Vitals:  Vitals:   05/15/21 1319 05/15/21 1538  BP: (!) 147/57 (!) 144/63  Pulse: 72 72  Resp: 16 16  Temp: 36.4 C 36.6 C  SpO2: 94% 96%    Last Pain:  Vitals:   05/15/21 1737  TempSrc:   PainSc: 4                  Lorann Tani COKER

## 2021-05-15 NOTE — Anesthesia Preprocedure Evaluation (Signed)
Anesthesia Evaluation  Patient identified by MRN, date of birth, ID band Patient awake    Reviewed: Allergy & Precautions, NPO status , Patient's Chart, lab work & pertinent test results  Airway Mallampati: II  TM Distance: >3 FB Neck ROM: Full    Dental  (+) Teeth Intact, Dental Advisory Given   Pulmonary former smoker,    breath sounds clear to auscultation       Cardiovascular  Rhythm:Regular Rate:Normal     Neuro/Psych    GI/Hepatic   Endo/Other    Renal/GU      Musculoskeletal   Abdominal   Peds  Hematology   Anesthesia Other Findings   Reproductive/Obstetrics                             Anesthesia Physical Anesthesia Plan  ASA: 3  Anesthesia Plan: General   Post-op Pain Management:    Induction: Intravenous  PONV Risk Score and Plan: Ondansetron and Dexamethasone  Airway Management Planned: Oral ETT  Additional Equipment:   Intra-op Plan:   Post-operative Plan: Extubation in OR  Informed Consent: I have reviewed the patients History and Physical, chart, labs and discussed the procedure including the risks, benefits and alternatives for the proposed anesthesia with the patient or authorized representative who has indicated his/her understanding and acceptance.     Dental advisory given  Plan Discussed with: CRNA and Anesthesiologist  Anesthesia Plan Comments:         Anesthesia Quick Evaluation

## 2021-05-15 NOTE — Brief Op Note (Signed)
05/15/2021  3:04 PM  PATIENT:  [name]  85 y.o. 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, C 1 fracture, cervicalgia, cervical radiculopathy  POST-OPERATIVE DIAGNOSIS:  Cervical spinal cord compression with C 1 C 2 instability, C 1 fracture, cervicalgia, cervical radiculopathy  PROCEDURE:  Procedure(s): Cervical One Laminectomy with resection of cyst, Fixation from Occiput to Cervical Four (N/A) with occipital cervical fusion with navigation  SURGEON:  Surgeon(s) and Role:    Erline Levine, MD - Primary    * Dawley, Theodoro Doing, DO  PHYSICIAN ASSISTANT:   ASSISTANTS: Poteat, RN   ANESTHESIA:   general  EBL:  50 mL   BLOOD ADMINISTERED:none  DRAINS: none   LOCAL MEDICATIONS USED:  MARCAINE    and LIDOCAINE   SPECIMEN:  No Specimen  DISPOSITION OF SPECIMEN:  N/A  COUNTS:  YES  TOURNIQUET:  * No tourniquets in log *  DICTATION: DICTATION: DICTATION:   Indications:  Patient is a 85 year old woman with C 1 C 2 instability and pannus at C 1 C 2 causing cord compression with severe neck pain and C 1 - C 2 instability.  It was elected to take her to surgery for posterior reconstruction, hopefully with C 1 C 2 fusion, but if this wasn't possible, then fusion from Occiput through C 4 with instrumentation and with posterolateral arthrodesis and decompression of spinal cord.  Procedure:  After the smooth and uncomplicated induction of general endotracheal anesthesia with Glide scope, patient was placed in 3 pin fixation and placed prone on the [place] table.   Preoperative localizing C arm was used to confirm adequacy of positioning.  Scalp was shaved.    After prep and drape with betadine scrub and Dura prep, area of incision was infiltrated with lidocaine.  Incision was made from occiput to C 5  levels exposed and the fractured and highly eroded C 1 ring was also exposed.  Our initial plan was to place hardware at C 1 with C 1 C 2  fusion, but C 1 was extremely mobile and did not appear sufficiently stable to adequately fixate or place hardware.  We therefore elected to perform occiput to C 4 fusion.  A CT scan with O-arm was then performed with the array on C 6.  We then placed a pedicle screw at C 2 on the right (3.5 x 28 mm) and, because of the extremely small pedicle of C 2 on the left, we place a translaminar C 2 screw (3.5 x 34 mm).  We placed lateral mass screws bilaterally at C 4 (C 3 and C 4 were already auto-fused) with lateral mass screws (3.5 x 14 mm).   Occipital plate was fixated to occiput with three cortical screws in the midline (8 x 4.5 x 2 (upper and lower midline) and 6 x 4.5 mm x 1(middle midline) and lateral right (8 x 4.5 mm)).  Rods were cut and bent, then affixed to the screw heads. The C 1 lamina was then removed along with the cyst and total laminectomy was performed at this level with wide decompression of the upper cervical cord Dura.  The posterolateral region was decorticated  and packed with 10 cc DBM and local autograft.  Hemostasis was assured, then wounds were irrigated, and closed with interrupted vicryl sutures (0, 2-0, 3-0) to reapproximate the skin edges.  Sterile occlusive dressing was placed and patient was removed from pins and returned to the  OR stretcher.  Counts were correct at the end of the case.   PLAN OF CARE: Admit to inpatient   PATIENT DISPOSITION:  PACU - hemodynamically stable.   Delay start of Pharmacological VTE agent (>24hrs) due to surgical blood loss or risk of bleeding: yes

## 2021-05-15 NOTE — Transfer of Care (Signed)
Immediate Anesthesia Transfer of Care Note  Patient: [name]  Procedure(s) Performed: Cervical One Laminectomy with resection of cyst, Fixation from Occiput to Cervical Four (Spine Cervical)  Patient Location: PACU  Anesthesia Type:General  Level of Consciousness: awake and patient cooperative  Airway & Oxygen Therapy: Patient Spontanous Breathing and Patient connected to face mask oxygen  Post-op Assessment: Report given to RN and Post -op Vital signs reviewed and stable  Post vital signs: Reviewed and stable  Last Vitals:  Vitals Value Taken Time  BP 145/63 05/15/21 1104  Temp    Pulse 74 05/15/21 1109  Resp 10 05/15/21 1109  SpO2 96 % 05/15/21 1109  Vitals shown include unvalidated device data.  Last Pain:  Vitals:   05/15/21 0635  TempSrc:   PainSc: 2       Patients Stated Pain Goal: 3 (96/22/29 7989)  Complications: No notable events documented.

## 2021-05-15 NOTE — Progress Notes (Signed)
Awake, alert, conversant.  MAEW with good strength.  Patient is doing well.   

## 2021-05-15 NOTE — Progress Notes (Signed)
Orthopedic Tech Progress Note Patient Details:  [name] May 13, 1936 [id number]  Ortho Devices Type of Ortho Device: Soft collar Ortho Device/Splint Interventions: Ordered      Germaine Pomfret 05/15/2021, 12:46 PM

## 2021-05-15 NOTE — Anesthesia Procedure Notes (Signed)
Procedure Name: Intubation Date/Time: 05/15/2021 7:42 AM Performed by: Renato Shin, CRNA Pre-anesthesia Checklist: Patient identified, Emergency Drugs available, Suction available and Patient being monitored Patient Re-evaluated:Patient Re-evaluated prior to induction Oxygen Delivery Method: Circle system utilized Preoxygenation: Pre-oxygenation with 100% oxygen Induction Type: IV induction Ventilation: Mask ventilation without difficulty Laryngoscope Size: Glidescope and 3 Grade View: Grade I Tube type: Oral Tube size: 7.0 mm Number of attempts: 1 Airway Equipment and Method: Stylet and Oral airway Placement Confirmation: ETT inserted through vocal cords under direct vision, positive ETCO2 and breath sounds checked- equal and bilateral Secured at: 21 cm Tube secured with: Tape Dental Injury: Teeth and Oropharynx as per pre-operative assessment

## 2021-05-16 MED ORDER — OXYCODONE HCL 5 MG PO TABS: 5.0000 mg | ORAL_TABLET | ORAL | 0 refills | Status: DC | PRN

## 2021-05-16 MED ORDER — METHOCARBAMOL 500 MG PO TABS: 500.0000 mg | ORAL_TABLET | Freq: Four times a day (QID) | ORAL | 2 refills | Status: DC | PRN

## 2021-05-16 NOTE — Discharge Summary (Signed)
Discharge Summary  Date of Admission: 05/15/2021  Date of Discharge: 05/16/21  Attending Physician: Erline Levine, MD  Hospital Course: Patient was admitted following an uncomplicated posterior S3-1 decompression and instrumented fusion. She was recovered in PACU and transferred to 4NP. Her hospital course was uncomplicated and the patient was discharged home on 05/16/21. She will follow up in clinic with Dr. Vertell Limber in 2 weeks.  Neurologic exam at discharge:  Strength 5/5 x4, SILTx4, no drift  Discharge diagnosis: Cervical stenosis  Judith Part, MD 05/16/21 12:17 PM

## 2021-05-16 NOTE — Progress Notes (Signed)
Neurosurgery Service Progress Note  Subjective: No acute events overnight, no weakness / numbness / paresthesias   Objective: Vitals:   05/15/21 1951 05/15/21 2336 05/16/21 0317 05/16/21 0856  BP: 126/64 (!) 132/58 127/78 132/72  Pulse: 69 67 72 86  Resp:  16 20 16   Temp: (!) 97.4 F (36.3 C) 97.6 F (36.4 C) 97.8 F (36.6 C) 98 F (36.7 C)  TempSrc:  Oral Oral Oral  SpO2: 95% 96% 93% 93%  Weight:      Height:        Physical Exam: Strength 5/5 x4, SILTx4 Incision c/d/i  Assessment & Plan: 85 y.o. woman s/p posterior cervical decompression and instrumented fusion, recovering well. -discharge home today  Judith Part  05/16/21 12:18 PM

## 2021-05-16 NOTE — Evaluation (Signed)
Occupational Therapy Evaluation Patient Details Name: [name] MRN: [id number] DOB: May 24, 1936 Today's Date: 05/16/2021    History of Present Illness Pt is an 85 yo female s/p C1-2 fusion, posterior occipital to C4 fixation. PMHx: fall, RA   Clinical Impression   Pt PTA Pt living at [place]. 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 Pt currently, limited by pain and fogginess for cognition d/t pain meds. Pt reports instability with mobility without using RW. Pt briefly standing at sink for light grooming and performing toilet hygiene in sitting and standing. Pt minA overall for ADL anf minguardA to minA for mobility with RW as safest method due to unsteadiness.  Cervical handout provided and reviewed adls in detail. Pt educated on: donning/doffing of soft collar (she's been wearing one for months), set an alarm at night for medication, avoid sitting for long periods of time, correct bed positioning for sleeping, correct sequence for bed mobility, avoiding lifting more than 5 pounds and never wash directly over incision. All education is complete and patient indicates understanding. Pt would benefit from acute and post acute therapy. Pt would benefit from continued OT skilled services. OT following acutely.     Follow Up Recommendations  Home health OT;Supervision/Assistance - 24 hour (Pt from [place] and either wants to go to SNF or home with [facility]. Pt wanted time to make decision after physical therapy sees pt.)    Equipment Recommendations  3 in 1 bedside commode    Recommendations for Other Services       Precautions / Restrictions Precautions Precautions: Fall;Cervical Precaution Booklet Issued: Yes (comment) Precaution Comments: pt reviewed cervical handout and issued handout Restrictions Weight Bearing Restrictions: No      Mobility Bed Mobility Overal bed mobility: Needs Assistance Bed Mobility: Rolling;Sidelying to Sit Rolling: Min guard Sidelying to  sit: Min guard       General bed mobility comments: education for log roll    Transfers Overall transfer level: Needs assistance Equipment used: Rolling walker (2 wheeled);1 person hand held assist Transfers: Sit to/from [facility] Sit to Stand: Min assist;Min guard Stand pivot transfers: Min guard       General transfer comment: MinA initially for balance; minguardA thereafter    Balance Overall balance assessment: Needs assistance Sitting-balance support: Bilateral upper extremity supported;Feet supported Sitting balance-Leahy Scale: Fair     Standing balance support: Bilateral upper extremity supported Standing balance-Leahy Scale: Poor Standing balance comment: at least single UE supported for OOB ADL                           ADL either performed or assessed with clinical judgement   ADL Overall ADL's : Needs assistance/impaired Eating/Feeding: Set up;Sitting   Grooming: Min guard;Standing   Upper Body Bathing: Set up;Sitting   Lower Body Bathing: Min guard;Minimal assistance;Sitting/lateral leans;Sit to/from stand   Upper Body Dressing : Set up;Sitting;Cueing for safety   Lower Body Dressing: Minimal assistance;Cueing for safety;Sitting/lateral leans;Sit to/from stand Lower Body Dressing Details (indicate cue type and reason): able to perform figure 4 technique Toilet Transfer: Min Fish farm manager Details (indicate cue type and reason): cues for hand placement Toileting- Clothing Manipulation and Hygiene: Min guard;Sit to/from stand       Functional mobility during ADLs: Min guard;Rolling walker;Cueing for safety General ADL Comments: Pt limited by pain and fogginess for cognition d/t pain meds. Pt reports instability with mobility without using RW. Pt briefly standing at sink  for light grooming and performing toilet hygiene in sitting and standing.     Vision Baseline Vision/History: Wears glasses Wears Glasses: At  all times Patient Visual Report: No change from baseline Vision Assessment?: No apparent visual deficits     Perception     Praxis      Pertinent Vitals/Pain Pain Assessment: 0-10 Pain Score: 8  Pain Location: neck Pain Descriptors / Indicators: Discomfort;Sore Pain Intervention(s): Monitored during session;Repositioned     Hand Dominance Right   Extremity/Trunk Assessment Upper Extremity Assessment Upper Extremity Assessment: Overall WFL for tasks assessed   Lower Extremity Assessment Lower Extremity Assessment: Generalized weakness   Cervical / Trunk Assessment Cervical / Trunk Assessment: Other exceptions Cervical / Trunk Exceptions: s/p cervical sx   Communication Communication Communication: No difficulties   Cognition Arousal/Alertness: Awake/alert Behavior During Therapy: WFL for tasks assessed/performed Overall Cognitive Status: Within Functional Limits for tasks assessed                                     General Comments  O2 >90% HR increased 80-100 BPM with exertio    Exercises     Shoulder Instructions      Home Living Family/patient expects to be discharged to:: Private residence Living Arrangements: Spouse/significant other Available Help at Discharge: Family;Available 24 hours/day Type of Home: Independent living facility Home Access: Level entry;Elevator     Home Layout: One level     Bathroom Shower/Tub: Occupational psychologist: Standard Bathroom Accessibility: No   Home Equipment: Environmental consultant - 2 wheels;Walker - 4 wheels;Grab bars - toilet;Grab bars - tub/shower   Additional Comments: Lives in [place]      Prior Functioning/Environment Level of Independence: Independent        Comments: hired help for IADLs        OT Problem List: Decreased strength;Decreased activity tolerance;Impaired balance (sitting and/or standing);Decreased coordination;Decreased cognition;Pain;Decreased knowledge of  precautions;Decreased safety awareness      OT Treatment/Interventions: Self-care/ADL training;Therapeutic exercise;Energy conservation;DME and/or AE instruction;Therapeutic activities;Visual/perceptual remediation/compensation;Patient/family education;Balance training    OT Goals(Current goals can be found in the care plan section) Acute Rehab OT Goals Patient Stated Goal: go home to spouse Time For Goal Achievement: 06/06/21 Potential to Achieve Goals: Good ADL Goals Pt Will Perform Lower Body Dressing: with set-up;sitting/lateral leans;sit to/from stand;with adaptive equipment Pt Will Transfer to Toilet: with supervision;ambulating;bedside commode Pt Will Perform Tub/Shower Transfer: with supervision;rolling walker;grab bars;Shower transfer  OT Frequency: Min 2X/week   Barriers to D/C:            Co-evaluation              AM-PAC OT "6 Clicks" Daily Activity     Outcome Measure Help from another person eating meals?: None Help from another person taking care of personal grooming?: A Little Help from another person toileting, which includes using toliet, bedpan, or urinal?: A Little Help from another person bathing (including washing, rinsing, drying)?: A Little Help from another person to put on and taking off regular upper body clothing?: A Little Help from another person to put on and taking off regular lower body clothing?: A Little 6 Click Score: 19   End of Session Equipment Utilized During Treatment: Gait belt;Rolling walker Nurse Communication: Mobility status;Precautions  Activity Tolerance: Patient tolerated treatment well Patient left: in chair;with call bell/phone within reach;with chair alarm set  OT Visit Diagnosis: Unsteadiness on feet (R26.81);Pain;Muscle  weakness (generalized) (M62.81);Other symptoms and signs involving cognitive function                Time: 0820-0915 OT Time Calculation (min): 55 min Charges:  OT General Charges $OT Visit: 1  Visit OT Evaluation $OT Eval Moderate Complexity: 1 Mod OT Treatments $Self Care/Home Management : 23-37 mins $Therapeutic Activity: 8-22 mins  Jefferey Pica, OTR/L Acute Rehabilitation Services Pager: [phone] Office: [phone]   [id number] C 05/16/2021, 11:04 AM

## 2021-05-16 NOTE — Discharge Instructions (Signed)
Discharge Instructions   Slowly increase your activities back to normal, no lifting over 20 pounds.   Okay to shower on the day of discharge. Be gentle when cleaning your incision. Use regular soap and water. If that is uncomfortable, try using baby shampoo. Do not submerge the wound under water for 2 weeks after surgery.   Follow up with Dr. Vertell Limber in 2 weeks after discharge. If you do not already have a discharge appointment, please call his office at [phone] to schedule a follow up appointment. If you have any concerns or questions, please call the office and let us know.

## 2021-05-16 NOTE — Evaluation (Signed)
Physical Therapy Evaluation Patient Details Name: [name] MRN: [id number] DOB: Oct 19, 1936 Today's Date: 05/16/2021   History of Present Illness  Pt is an 85 yo female s/p C1-2 fusion, posterior occipital to C4 fixation. PMHx: fall, RA  Clinical Impression  Pt OOB walking independently in room upon arrival of PT, eager for evaluation at this time. Prior to admission the pt was completely independent, active in community with ambulation and attending multiple exercise classes. The pt now presents with minor limitations in functional mobility, activity tolerance, and dynamic stability due to above dx, but is safe to return home with arranged supervision and assist when medically cleared. The pt was able to demo multiple sit-stand transfers without assist or need for UE support through session without any evidence of instability. The pt also completed >200 ft of hallway ambulation that included changes in speed, direction, and dynamic balance challenges without LOB. Progressive walking program and cervical precautions were discussed with pt and her son, both verbalized understanding. Will be safe to return to [facility] with therapies there when cleared as pt is eager to return to exercise classes.      Follow Up Recommendations Outpatient PT;Supervision for mobility/OOB (for neck strengthening and graded return to exercise when cleared from MD)    Equipment Recommendations  None recommended by PT    Recommendations for Other Services       Precautions / Restrictions Precautions Precautions: Fall;Cervical Precaution Booklet Issued: Yes (comment) Precaution Comments: discussed verbally, pt able to recall and apply to activities Required Braces or Orthoses: Cervical Brace Cervical Brace: Soft collar Restrictions Weight Bearing Restrictions: No      Mobility  Bed Mobility Overal bed mobility: Needs Assistance Bed Mobility: Rolling;Sidelying to Sit Rolling: Supervision Sidelying to  sit: Min guard       General bed mobility comments: minG with cues for log roll, pt then able to complete x3 without cues    Transfers Overall transfer level: Needs assistance Equipment used: None Transfers: Sit to/from Stand Sit to Stand: Min guard;Supervision Stand pivot transfers: Min guard       General transfer comment: minG for safety, no UE support given at this time, pt with no LOB or instability with initial stand, completed x10 through session  Ambulation/Gait Ambulation/Gait assistance: Min guard Gait Distance (Feet): 300 Feet Assistive device: None Gait Pattern/deviations: Step-through pattern;Drifts right/left Gait velocity: 0.8 m/s Gait velocity interpretation: 1.31 - 2.62 ft/sec, indicative of limited community ambulator General Gait Details: pt with generally functional gait, intermittent lateral drifting but pt able to recover, max cues for turning entire body to visually scan      Balance Overall balance assessment: Needs assistance Sitting-balance support: Bilateral upper extremity supported;Feet supported Sitting balance-Leahy Scale: Fair     Standing balance support: No upper extremity supported Standing balance-Leahy Scale: Good Standing balance comment: no UE support, pt moving independently in room upon arrival of PT                             Pertinent Vitals/Pain Pain Assessment: Faces Pain Score: 2  Faces Pain Scale: Hurts a little bit Pain Location: neck Pain Descriptors / Indicators: Discomfort;Sore Pain Intervention(s): Limited activity within patient's tolerance;Monitored during session;Repositioned    Home Living Family/patient expects to be discharged to:: Private residence Living Arrangements: Spouse/significant other Available Help at Discharge: Family;Available 24 hours/day Type of Home: Independent living facility Home Access: Level entry;Elevator     Home Layout: One level  Home Equipment: [place] - 2  wheels;Walker - 4 wheels;Grab bars - toilet;Grab bars - tub/shower Additional Comments: Lives in [place]    Prior Function Level of Independence: Independent         Comments: hired help for IADLs     Hand Dominance   Dominant Hand: Right    Extremity/Trunk Assessment   Upper Extremity Assessment Upper Extremity Assessment: Overall WFL for tasks assessed    Lower Extremity Assessment Lower Extremity Assessment: Overall WFL for tasks assessed    Cervical / Trunk Assessment Cervical / Trunk Assessment: Other exceptions Cervical / Trunk Exceptions: s/p cervical sx  Communication   Communication: No difficulties  Cognition Arousal/Alertness: Awake/alert Behavior During Therapy: WFL for tasks assessed/performed Overall Cognitive Status: Within Functional Limits for tasks assessed                                        General Comments General comments (skin integrity, edema, etc.): VSS on RA     PT Assessment All further PT needs can be met in the next venue of care  PT Problem List Decreased activity tolerance;Decreased balance;Decreased mobility       PT Treatment Interventions      PT Goals (Current goals can be found in the Care Plan section)  Acute Rehab PT Goals Patient Stated Goal: to get back to exercise classes PT Goal Formulation: With patient Time For Goal Achievement: 05/30/21 Potential to Achieve Goals: Good     AM-PAC PT "6 Clicks" Mobility  Outcome Measure Help needed turning from your back to your side while in a flat bed without using bedrails?: None Help needed moving from lying on your back to sitting on the side of a flat bed without using bedrails?: A Little Help needed moving to and from a bed to a chair (including a wheelchair)?: A Little Help needed standing up from a chair using your arms (e.g., wheelchair or bedside chair)?: None Help needed to walk in hospital room?: A Little Help needed climbing 3-5 steps  with a railing? : A Little 6 Click Score: 20    End of Session Equipment Utilized During Treatment: Gait belt;Cervical collar Activity Tolerance: Patient tolerated treatment well;No increased pain Patient left: in bed;with family/visitor present (sitting EOB) Nurse Communication: Mobility status PT Visit Diagnosis: Other abnormalities of gait and mobility (R26.89)    Time: 9379-0240 PT Time Calculation (min) (ACUTE ONLY): 31 min   Charges:   PT Evaluation $PT Eval Low Complexity: 1 Low PT Treatments $Gait Training: 8-22 mins        Karma Ganja, PT, DPT   Acute Rehabilitation Department Pager #: [phone]  Otho Bellows 05/16/2021, 11:51 AM

## 2021-05-17 NOTE — Assessment & Plan Note (Signed)
Continue with lorazepam as needed

## 2021-05-17 NOTE — Assessment & Plan Note (Signed)
Follow-up with Dr. Estanislado Pandy when she recovered after surgery

## 2021-05-17 NOTE — Assessment & Plan Note (Signed)
This does not seem to produce any symptoms at present.  We will continue to observe/monitor

## 2021-05-17 NOTE — Assessment & Plan Note (Signed)
No relapse of symptoms.  The symptoms were related to C1 RA related pathology.  C-spine surgery is pending.

## 2021-05-17 NOTE — Assessment & Plan Note (Signed)
[name] is seeing Dr. Vertell Limber: plan -  posterior C1-2 decompression and instrumented fusion. MRI: Progressive soft tissue pannus at C1-2 is now creating mass effect on the craniocervical junction with distortion of the upper spinal cord. This is likely related rheumatoid arthritis C spine CT: Fracture of the anterior arch of C1 with 12 mm separation. Nondisplaced fracture posterior arch of C1. There is lateral subluxation of the lateral mass of C1 on C2 bilaterally. No other cervical spine fracture . We discussed her upcoming surgery and postop care

## 2021-05-18 ENCOUNTER — Other Ambulatory Visit: Payer: Self-pay | Admitting: Internal Medicine

## 2021-05-19 ENCOUNTER — Encounter (HOSPITAL_COMMUNITY): Payer: Self-pay | Admitting: Neurosurgery

## 2021-05-19 MED FILL — Heparin Sodium (Porcine) Inj 1000 Unit/ML: INTRAMUSCULAR | Qty: 30 | Status: AC

## 2021-05-19 MED FILL — Sodium Chloride IV Soln 0.9%: INTRAVENOUS | Qty: 1000 | Status: AC

## 2021-05-19 NOTE — OR Nursing (Signed)
Implants corrected to include an additional item that needed to be set up in Epic for documentation purposes.

## 2021-05-20 ENCOUNTER — Ambulatory Visit: Payer: Medicare Other | Admitting: Internal Medicine

## 2021-05-27 DIAGNOSIS — M542 Cervicalgia: Secondary | ICD-10-CM | POA: Diagnosis not present

## 2021-06-02 ENCOUNTER — Other Ambulatory Visit: Payer: Self-pay | Admitting: Gastroenterology

## 2021-06-04 ENCOUNTER — Other Ambulatory Visit: Payer: Self-pay | Admitting: Internal Medicine

## 2021-06-15 DIAGNOSIS — R030 Elevated blood-pressure reading, without diagnosis of hypertension: Secondary | ICD-10-CM | POA: Diagnosis not present

## 2021-06-15 DIAGNOSIS — S12001K Unspecified nondisplaced fracture of first cervical vertebra, subsequent encounter for fracture with nonunion: Secondary | ICD-10-CM | POA: Diagnosis not present

## 2021-06-15 DIAGNOSIS — M542 Cervicalgia: Secondary | ICD-10-CM | POA: Diagnosis not present

## 2021-06-16 ENCOUNTER — Ambulatory Visit (INDEPENDENT_AMBULATORY_CARE_PROVIDER_SITE_OTHER): Payer: Medicare Other | Admitting: Internal Medicine

## 2021-06-16 ENCOUNTER — Encounter: Payer: Self-pay | Admitting: Internal Medicine

## 2021-06-16 ENCOUNTER — Other Ambulatory Visit: Payer: Self-pay

## 2021-06-16 VITALS — BP 140/72 | HR 80 | Temp 98.4°F | Wt 119.6 lb

## 2021-06-16 DIAGNOSIS — K5901 Slow transit constipation: Secondary | ICD-10-CM

## 2021-06-16 DIAGNOSIS — M542 Cervicalgia: Secondary | ICD-10-CM

## 2021-06-16 DIAGNOSIS — R5382 Chronic fatigue, unspecified: Secondary | ICD-10-CM | POA: Diagnosis not present

## 2021-06-16 DIAGNOSIS — G459 Transient cerebral ischemic attack, unspecified: Secondary | ICD-10-CM

## 2021-06-16 DIAGNOSIS — E785 Hyperlipidemia, unspecified: Secondary | ICD-10-CM | POA: Diagnosis not present

## 2021-06-16 DIAGNOSIS — J301 Allergic rhinitis due to pollen: Secondary | ICD-10-CM

## 2021-06-16 DIAGNOSIS — F419 Anxiety disorder, unspecified: Secondary | ICD-10-CM

## 2021-06-16 NOTE — Assessment & Plan Note (Addendum)
Worse.  Postop constipation related to oxycodone and Robaxin.  We can discontinue oxycodone Robaxin.  The patient will use tramadol and Tylenol instead. Use MiraLAX or Senokot as to produce soft regular stools.  Discontinue Pepto-Bismol.  I think this should help with abdominal bloating complaint.

## 2021-06-16 NOTE — Assessment & Plan Note (Signed)
Postop pain.  Status post cervical fusion.  No driving Discontinue oxycodone and Robaxin Use tramadol and Tylenol as needed

## 2021-06-16 NOTE — Progress Notes (Signed)
Subjective:  Patient ID: [name], female    DOB: 06/26/1936  Age: 85 y.o. MRN: [id number]  CC: GI Problem (Pt states her stomach has been upset.[name] its a little better now since she stop the Oxycodone & Methocarbamol ) and nasal drip   HPI [name] presents for abd discomfort - ?while on Oxy and Robaxin. [name] has stopped both  --  taking Tramadol, Tylenol - better C/o fatigue. Neck stiffness C/o postnasal drip - can't use her netty pot due to stiff neck  Outpatient Medications Prior to Visit  Medication Sig Dispense Refill   acetaminophen (TYLENOL) 500 MG tablet Take 500 mg by mouth 2 (two) times daily as needed for mild pain.     azelastine (ASTELIN) 0.1 % nasal spray Place 1 spray into both nostrils at bedtime.     Calcium Carbonate-Vitamin D (CALCIUM-VITAMIN D) 500-200 MG-UNIT per tablet Take 3 tablets by mouth daily.     desonide (DESOWEN) 0.05 % cream Apply 1 application topically as needed.     diclofenac Sodium (VOLTAREN) 1 % GEL APPLY 4 GRAMS 4 TIMES DAILY. 200 g 3   EPINEPHrine (EPIPEN 2-PAK) 0.3 mg/0.3 mL IJ SOAJ injection Inject 0.3 mLs (0.3 mg total) into the muscle as needed. 1 Device 1   estradiol (ESTRACE) 0.1 MG/GM vaginal cream Place 1 Applicatorful vaginally See admin instructions. Once every few weeks     famotidine (PEPCID) 40 MG tablet TAKE ONE TABLET BY MOUTH AT BEDTIME 30 tablet 0   ipratropium (ATROVENT) 0.06 % nasal spray Place 1 spray into the nose 3 (three) times daily as needed for rhinitis. 15 mL 0   levocetirizine (XYZAL) 5 MG tablet Take 5 mg by mouth every evening.     LORazepam (ATIVAN) 1 MG tablet TAKE 2-3 TABLETS AT BEDTIME AND 1-2 TABLETS IN THE DAYTIME AS NEEDED. 150 tablet 1   montelukast (SINGULAIR) 10 MG tablet Take 1 tablet (10 mg total) by mouth at bedtime. 90 tablet 1   pantoprazole (PROTONIX) 40 MG tablet TAKE (1) TABLET TWICE A DAY BEFORE MEALS. 60 tablet 5   simvastatin (ZOCOR) 40 MG tablet TAKE 1 TABLET ONCE DAILY. 90 tablet 3    SYNTHROID 50 MCG tablet TAKE 1 TABLET ONCE DAILY BEFORE BREAKFAST. 30 tablet 5   triamcinolone (NASACORT) 55 MCG/ACT AERO nasal inhaler Place 2 sprays into the nose daily.     VESICARE 10 MG tablet Take 10 mg by mouth daily as needed (bladder).     methocarbamol (ROBAXIN) 500 MG tablet Take 1 tablet (500 mg total) by mouth every 6 (six) hours as needed for muscle spasms. (Patient not taking: Reported on 06/16/2021) 30 tablet 2   oxyCODONE (OXY IR/ROXICODONE) 5 MG immediate release tablet Take 1 tablet (5 mg total) by mouth every 3 (three) hours as needed for moderate pain ((score 4 to 6)). (Patient not taking: Reported on 06/16/2021) 30 tablet 0   No facility-administered medications prior to visit.    ROS: Review of Systems  Constitutional:  Positive for fatigue. Negative for activity change, appetite change, chills and unexpected weight change.  HENT:  Negative for congestion, mouth sores and sinus pressure.   Eyes:  Negative for visual disturbance.  Respiratory:  Negative for cough and chest tightness.   Gastrointestinal:  Positive for abdominal distention and constipation. Negative for abdominal pain, diarrhea, nausea and vomiting.  Genitourinary:  Negative for difficulty urinating, frequency and vaginal pain.  Musculoskeletal:  Positive for arthralgias, neck pain and neck stiffness. Negative for  back pain and gait problem.  Skin:  Negative for pallor and rash.  Neurological:  Negative for dizziness, tremors, weakness, numbness and headaches.  Psychiatric/Behavioral:  Positive for sleep disturbance. Negative for confusion and suicidal ideas.    Objective:  BP 140/72 (BP Location: Left Arm)   Pulse 80   Temp 98.4 F (36.9 C) (Oral)   Wt 119 lb 9.6 oz (54.3 kg)   SpO2 96%   BMI 23.36 kg/m   BP Readings from Last 3 Encounters:  06/16/21 140/72  05/16/21 140/70  05/13/21 126/82    Wt Readings from Last 3 Encounters:  06/16/21 119 lb 9.6 oz (54.3 kg)  05/15/21 122 lb (55.3 kg)   05/13/21 125 lb (56.7 kg)    Physical Exam Constitutional:      General: She is not in acute distress.    Appearance: Normal appearance. She is well-developed.  HENT:     Head: Normocephalic.     Right Ear: External ear normal.     Left Ear: External ear normal.     Nose: Nose normal.  Eyes:     General:        Right eye: No discharge.        Left eye: No discharge.     Conjunctiva/sclera: Conjunctivae normal.     Pupils: Pupils are equal, round, and reactive to light.  Neck:     Thyroid: No thyromegaly.     Vascular: No JVD.     Trachea: No tracheal deviation.  Cardiovascular:     Rate and Rhythm: Normal rate and regular rhythm.     Heart sounds: Normal heart sounds.  Pulmonary:     Effort: No respiratory distress.     Breath sounds: No stridor. No wheezing.  Abdominal:     General: Bowel sounds are normal. There is no distension.     Palpations: Abdomen is soft. There is no mass.     Tenderness: There is no abdominal tenderness. There is no guarding or rebound.  Musculoskeletal:        General: Tenderness present.     Cervical back: Normal range of motion and neck supple. No rigidity.  Lymphadenopathy:     Cervical: No cervical adenopathy.  Skin:    Findings: No erythema or rash.  Neurological:     Mental Status: She is oriented to person, place, and time.     Cranial Nerves: No cranial nerve deficit.     Motor: No abnormal muscle tone.     Coordination: Coordination normal.     Gait: Gait normal.     Deep Tendon Reflexes: Reflexes normal.  Psychiatric:        Behavior: Behavior normal.        Thought Content: Thought content normal.        Judgment: Judgment normal.  Stiff neck with postop healing scar on the posterior spine, no range of motion yet Abdomen is soft, slightly distended, no mass or hernia  Lab Results  Component Value Date   WBC 7.4 05/13/2021   HGB 12.6 05/13/2021   HCT 37.5 05/13/2021   PLT 245 05/13/2021   GLUCOSE 93 05/13/2021   CHOL  200 03/23/2021   TRIG 187.0 (H) 03/23/2021   HDL 54.60 03/23/2021   LDLDIRECT 93.0 09/18/2020   LDLCALC 108 (H) 03/23/2021   ALT 34 03/23/2021   AST 21 03/23/2021   NA 135 05/13/2021   K 4.1 05/13/2021   CL 102 05/13/2021   CREATININE 0.67 05/13/2021  BUN 15 05/13/2021   CO2 26 05/13/2021   TSH 1.89 12/19/2020   HGBA1C 5.5 06/13/2020    No results found.  Assessment & Plan:    Walker Kehr, MD

## 2021-06-16 NOTE — Assessment & Plan Note (Signed)
Take lorazepam at night as needed

## 2021-06-16 NOTE — Patient Instructions (Signed)
Try Miralax or Senakot S for constipation

## 2021-06-16 NOTE — Assessment & Plan Note (Signed)
Recovering  

## 2021-06-16 NOTE — Assessment & Plan Note (Signed)
It was not a TIA.  The symptoms were related to cervical spinal cord compression-resolved after surgery

## 2021-06-16 NOTE — Assessment & Plan Note (Signed)
Discontinue Nettie pot.  Will use Mcneal squeeze bottle for nasal irrigation

## 2021-06-19 ENCOUNTER — Ambulatory Visit: Payer: Medicare Other

## 2021-07-01 ENCOUNTER — Encounter: Payer: Medicare Other | Admitting: Women's Health

## 2021-07-01 ENCOUNTER — Encounter: Payer: Medicare Other | Admitting: Obstetrics and Gynecology

## 2021-07-09 ENCOUNTER — Other Ambulatory Visit: Payer: Self-pay | Admitting: Gastroenterology

## 2021-07-21 DIAGNOSIS — L309 Dermatitis, unspecified: Secondary | ICD-10-CM | POA: Diagnosis not present

## 2021-07-21 DIAGNOSIS — B027 Disseminated zoster: Secondary | ICD-10-CM | POA: Diagnosis not present

## 2021-07-21 DIAGNOSIS — B029 Zoster without complications: Secondary | ICD-10-CM | POA: Diagnosis not present

## 2021-08-02 ENCOUNTER — Other Ambulatory Visit: Payer: Self-pay | Admitting: Gastroenterology

## 2021-08-05 DIAGNOSIS — L570 Actinic keratosis: Secondary | ICD-10-CM | POA: Diagnosis not present

## 2021-08-05 DIAGNOSIS — F458 Other somatoform disorders: Secondary | ICD-10-CM | POA: Diagnosis not present

## 2021-08-05 DIAGNOSIS — L821 Other seborrheic keratosis: Secondary | ICD-10-CM | POA: Diagnosis not present

## 2021-08-05 DIAGNOSIS — L578 Other skin changes due to chronic exposure to nonionizing radiation: Secondary | ICD-10-CM | POA: Diagnosis not present

## 2021-08-05 DIAGNOSIS — L723 Sebaceous cyst: Secondary | ICD-10-CM | POA: Diagnosis not present

## 2021-08-05 DIAGNOSIS — K6289 Other specified diseases of anus and rectum: Secondary | ICD-10-CM | POA: Diagnosis not present

## 2021-08-05 DIAGNOSIS — Z85828 Personal history of other malignant neoplasm of skin: Secondary | ICD-10-CM | POA: Diagnosis not present

## 2021-08-07 ENCOUNTER — Encounter: Payer: Self-pay | Admitting: Obstetrics and Gynecology

## 2021-08-07 ENCOUNTER — Ambulatory Visit (INDEPENDENT_AMBULATORY_CARE_PROVIDER_SITE_OTHER): Payer: Medicare Other | Admitting: Obstetrics and Gynecology

## 2021-08-07 ENCOUNTER — Other Ambulatory Visit: Payer: Self-pay

## 2021-08-07 ENCOUNTER — Other Ambulatory Visit (HOSPITAL_COMMUNITY)
Admission: RE | Admit: 2021-08-07 | Discharge: 2021-08-07 | Disposition: A | Discharge status: Home / Self Care | Payer: Medicare Other | Source: Ambulatory Visit | Attending: Obstetrics and Gynecology | Admitting: Obstetrics and Gynecology

## 2021-08-07 VITALS — BP 136/82 | HR 88 | Ht 59.0 in | Wt 117.0 lb

## 2021-08-07 DIAGNOSIS — N952 Postmenopausal atrophic vaginitis: Secondary | ICD-10-CM

## 2021-08-07 DIAGNOSIS — J3081 Allergic rhinitis due to animal (cat) (dog) hair and dander: Secondary | ICD-10-CM | POA: Insufficient documentation

## 2021-08-07 DIAGNOSIS — Z124 Encounter for screening for malignant neoplasm of cervix: Secondary | ICD-10-CM | POA: Insufficient documentation

## 2021-08-07 DIAGNOSIS — L290 Pruritus ani: Secondary | ICD-10-CM | POA: Diagnosis not present

## 2021-08-07 DIAGNOSIS — Z8739 Personal history of other diseases of the musculoskeletal system and connective tissue: Secondary | ICD-10-CM

## 2021-08-07 DIAGNOSIS — S12000A Unspecified displaced fracture of first cervical vertebra, initial encounter for closed fracture: Secondary | ICD-10-CM | POA: Insufficient documentation

## 2021-08-07 DIAGNOSIS — R159 Full incontinence of feces: Secondary | ICD-10-CM | POA: Diagnosis not present

## 2021-08-07 DIAGNOSIS — Z01411 Encounter for gynecological examination (general) (routine) with abnormal findings: Secondary | ICD-10-CM

## 2021-08-07 DIAGNOSIS — S12100A Unspecified displaced fracture of second cervical vertebra, initial encounter for closed fracture: Secondary | ICD-10-CM | POA: Insufficient documentation

## 2021-08-07 DIAGNOSIS — J4520 Mild intermittent asthma, uncomplicated: Secondary | ICD-10-CM

## 2021-08-07 DIAGNOSIS — J301 Allergic rhinitis due to pollen: Secondary | ICD-10-CM | POA: Insufficient documentation

## 2021-08-07 HISTORY — DX: Unspecified displaced fracture of second cervical vertebra, initial encounter for closed fracture: S12.100A

## 2021-08-07 HISTORY — DX: Allergic rhinitis due to pollen: J30.1

## 2021-08-07 HISTORY — DX: Mild intermittent asthma, uncomplicated: J45.20

## 2021-08-07 HISTORY — DX: Unspecified displaced fracture of first cervical vertebra, initial encounter for closed fracture: S12.000A

## 2021-08-07 HISTORY — DX: Allergic rhinitis due to animal (cat) (dog) hair and dander: J30.81

## 2021-08-07 MED ORDER — BETAMETHASONE VALERATE 0.1 % EX OINT: 1.0000 "application " | TOPICAL_OINTMENT | Freq: Two times a day (BID) | CUTANEOUS | 0 refills | Status: DC

## 2021-08-07 MED ORDER — ESTRADIOL 0.1 MG/GM VA CREA: TOPICAL_CREAM | VAGINAL | 1 refills | Status: DC

## 2021-08-07 NOTE — Patient Instructions (Signed)
Try metamucil to bulk your stool  Kegel Exercises Kegel exercises can help strengthen your pelvic floor muscles. The pelvic floor is a group of muscles that support your rectum, small intestine, and bladder. In females, pelvic floor muscles also help support the womb (uterus). These muscles help you control the flow of urine and stool. Kegel exercises are painless and simple, and they do not require any equipment. Your provider may suggest Kegel exercises to: Improve bladder and bowel control. Improve sexual response. Improve weak pelvic floor muscles after surgery to remove the uterus (hysterectomy) or pregnancy (females). Improve weak pelvic floor muscles after prostate gland removal or surgery (males). Kegel exercises involve squeezing your pelvic floor muscles, which are the same muscles you squeeze when you try to stop the flow of urine or keep from passing gas. The exercises can be done while sitting, standing, or lying down, but it is best to vary your position. Exercises How to do Kegel exercises: Squeeze your pelvic floor muscles tight. You should feel a tight lift in your rectal area. If you are a female, you should also feel a tightness in your vaginal area. Keep your stomach, buttocks, and legs relaxed. Hold the muscles tight for up to 10 seconds. Breathe normally. Relax your muscles. Repeat as told by your health care provider. Repeat this exercise daily as told by your health care provider. Continue to do this exercise for at least 4-6 weeks, or for as long as told by your health care provider. You may be referred to a physical therapist who can help you learn more about how to do Kegel exercises. Depending on your condition, your health care provider may recommend: Varying how long you squeeze your muscles. Doing several sets of exercises every day. Doing exercises for several weeks. Making Kegel exercises a part of your regular exercise routine. This information is not  intended to replace advice given to you by your health care provider. Make sure you discuss any questions you have with your health care provider. Document Revised: 11/05/2020 Document Reviewed: 07/05/2018 Elsevier Patient Education  2022 [place]   We recommended that you start or continue a regular exercise program for good health. Physical activity is anything that gets your body moving, some is better than none. The CDC recommends 150 minutes per week of Moderate-Intensity Aerobic Activity and 2 or more days of Muscle Strengthening Activity.  Benefits of exercise are limitless: helps weight loss/weight maintenance, improves mood and energy, helps with depression and anxiety, improves sleep, tones and strengthens muscles, improves balance, improves bone density, protects from chronic conditions such as heart disease, high blood pressure and diabetes and so much more. To learn more visit: WhyNotPoker.uy  DIET: Good nutrition starts with a healthy diet of fruits, vegetables, whole grains, and lean protein sources. Drink plenty of water for hydration. Minimize empty calories, sodium, sweets. For more information about dietary recommendations visit: GeekRegister.com.ee and http://schaefer-mitchell.com/  ALCOHOL:  Women should limit their alcohol intake to no more than 7 drinks/beers/glasses of wine (combined, not each!) per week. Moderation of alcohol intake to this level decreases your risk of breast cancer and liver damage.  If you are concerned that you may have a problem, or your friends have told you they are concerned about your drinking, there are many resources to help. A well-known program that is free, effective, and available to all people all over the nation is Alcoholics Anonymous.  Check out this site to learn more: BlockTaxes.se   CALCIUM  AND VITAMIN D:  Adequate intake of calcium and  Vitamin D are recommended for bone health.  You should be getting between 1000-1200 mg of calcium and 800 units of Vitamin D daily between diet and supplements  PAP SMEARS:  Pap smears, to check for cervical cancer or precancers,  have traditionally been done yearly, scientific advances have shown that most women can have pap smears less often.  However, every woman still should have a physical exam from her gynecologist every year. It will include a breast check, inspection of the vulva and vagina to check for abnormal growths or skin changes, a visual exam of the cervix, and then an exam to evaluate the size and shape of the uterus and ovaries. We will also provide age appropriate advice regarding health maintenance, like when you should have certain vaccines, screening for sexually transmitted diseases, bone density testing, colonoscopy, mammograms, etc.   MAMMOGRAMS:  All women over 34 years old should have a routine mammogram.   COLON CANCER SCREENING: Now recommend starting at age 31. At this time colonoscopy is not covered for routine screening until 50. There are take home tests that can be done between 45-49.   COLONOSCOPY:  Colonoscopy to screen for colon cancer is recommended for all women at age 45.  We know, you hate the idea of the prep.  We agree, BUT, having colon cancer and not knowing it is worse!!  Colon cancer so often starts as a polyp that can be seen and removed at colonscopy, which can quite literally save your life!  And if your first colonoscopy is normal and you have no family history of colon cancer, most women don't have to have it again for 10 years.  Once every ten years, you can do something that may end up saving your life, right?  We will be happy to help you get it scheduled when you are ready.  Be sure to check your insurance coverage so you understand how much it will cost.  It may be covered as a preventative service at no cost, but you should check your particular  policy.      Breast Self-Awareness Breast self-awareness means being familiar with how your breasts look and feel. It involves checking your breasts regularly and reporting any changes to your health care provider. Practicing breast self-awareness is important. A change in your breasts can be a sign of a serious medical problem. Being familiar with how your breasts look and feel allows you to find any problems early, when treatment is more likely to be successful. All women should practice breast self-awareness, including women who have had breast implants. How to do a breast self-exam One way to learn what is normal for your breasts and whether your breasts are changing is to do a breast self-exam. To do a breast self-exam: Look for Changes  Remove all the clothing above your waist. Stand in front of a mirror in a room with good lighting. Put your hands on your hips. Push your hands firmly downward. Compare your breasts in the mirror. Look for differences between them (asymmetry), such as: Differences in shape. Differences in size. Puckers, dips, and bumps in one breast and not the other. Look at each breast for changes in your skin, such as: Redness. Scaly areas. Look for changes in your nipples, such as: Discharge. Bleeding. Dimpling. Redness. A change in position. Feel for Changes Carefully feel your breasts for lumps and changes. It is best to do this while  lying on your back on the floor and again while sitting or standing in the shower or tub with soapy water on your skin. Feel each breast in the following way: Place the arm on the side of the breast you are examining above your head. Feel your breast with the other hand. Start in the nipple area and make  inch (2 cm) overlapping circles to feel your breast. Use the pads of your three middle fingers to do this. Apply light pressure, then medium pressure, then firm pressure. The light pressure will allow you to feel the tissue  closest to the skin. The medium pressure will allow you to feel the tissue that is a little deeper. The firm pressure will allow you to feel the tissue close to the ribs. Continue the overlapping circles, moving downward over the breast until you feel your ribs below your breast. Move one finger-width toward the center of the body. Continue to use the  inch (2 cm) overlapping circles to feel your breast as you move slowly up toward your collarbone. Continue the up and down exam using all three pressures until you reach your armpit.  Write Down What You Find  Write down what is normal for each breast and any changes that you find. Keep a written record with breast changes or normal findings for each breast. By writing this information down, you do not need to depend only on memory for size, tenderness, or location. Write down where you are in your menstrual cycle, if you are still menstruating. If you are having trouble noticing differences in your breasts, do not get discouraged. With time you will become more familiar with the variations in your breasts and more comfortable with the exam. How often should I examine my breasts? Examine your breasts every month. If you are breastfeeding, the best time to examine your breasts is after a feeding or after using a breast pump. If you menstruate, the best time to examine your breasts is 5-7 days after your period is over. During your period, your breasts are lumpier, and it may be more difficult to notice changes. When should I see my health care provider? See your health care provider if you notice: A change in shape or size of your breasts or nipples. A change in the skin of your breast or nipples, such as a reddened or scaly area. Unusual discharge from your nipples. A lump or thick area that was not there before. Pain in your breasts. Anything that concerns you.

## 2021-08-07 NOTE — Progress Notes (Signed)
85 y.o. [id number] Married White or Caucasian Not Hispanic or Latino female here for breast and pelvic exam.     She is on vaginal estrogen, she forgets it sometimes. More comfortable when using it. Sexually active on occasion, no pain. No vaginal bleeding. No bowel or bladder c/o.   She has a couple of month h/o rectal itching and irritation, she has some leakage of soft stool or staining. Her stools are soft.   No LMP recorded. Patient is postmenopausal.          Sexually active: Yes.    The current method of family planning is post menopausal status.    Exercising: No.  The patient does not participate in regular exercise at present. Smoker:  no  Health Maintenance: Pap:  07/03/20 WNL  06/26/19 WNL  History of abnormal Pap:  no MMG:  11/05/20 density B Bi-rads 1 neg  BMD:   10/30/20 osteoporosis T-score -2.7. Increase in BMD in right hip; 07/04/18 Low T score of -2.8 in her forearm.  On Prolia for 5 years Colonoscopy: 06/28/12 normal  TDaP:  06/18/20  Gardasil: n/a   reports that she has quit smoking. She has never used smokeless tobacco. She reports current alcohol use. She reports that she does not use drugs. 4-7 glasses of wine a week. [facility]. 2 sons, 2 grandsons. She is from [place], moved her 70 years.   Past Medical History:  Diagnosis Date   Allergic rhinitis    Asthma    Atrophic vaginitis    Baker's cyst    Left-Dr. Aluisio   Cataract    Dr. Katy Fitch   CHF (congestive heart failure) (HCC)    Colon polyps    Fibroid    Gastritis    chronic   GERD (gastroesophageal reflux disease)    Heart murmur    Hemorrhoids    Hyperlipidemia    Hypothyroidism    IBS (irritable bowel syndrome)    MVP (mitral valve prolapse)    Antibiotics required for dental procedures   OA (osteoarthritis)    Osteoporosis 06/2018   T score -2.2 stable on Prolia   Overactive bladder    Thyroid disease    Thyroid nodule    Torn meniscus    bilateral    Past Surgical History:   Procedure Laterality Date   CATARACT EXTRACTION, BILATERAL     EYE SURGERY     POSTERIOR CERVICAL FUSION/FORAMINOTOMY N/A 05/15/2021   Procedure: Cervical One Laminectomy with resection of cyst, Fixation from Occiput to Cervical Four;  Surgeon: Erline Levine, MD;  Location: [facility];  Service: Neurosurgery;  Laterality: N/A;   TONSILLECTOMY      Current Outpatient Medications  Medication Sig Dispense Refill   acetaminophen (TYLENOL) 500 MG tablet Take 500 mg by mouth 2 (two) times daily as needed for mild pain.     azelastine (ASTELIN) 0.1 % nasal spray Place 1 spray into both nostrils at bedtime.     Calcium Carbonate-Vitamin D (CALCIUM-VITAMIN D) 500-200 MG-UNIT per tablet Take 3 tablets by mouth daily.     desonide (DESOWEN) 0.05 % cream Apply 1 application topically as needed.     diclofenac Sodium (VOLTAREN) 1 % GEL APPLY 4 GRAMS 4 TIMES DAILY. 200 g 3   EPINEPHrine (EPIPEN 2-PAK) 0.3 mg/0.3 mL IJ SOAJ injection Inject 0.3 mLs (0.3 mg total) into the muscle as needed. 1 Device 1   famotidine (PEPCID) 40 MG tablet Take 1 tablet (40 mg total) by mouth  at bedtime. NEEDS OFFICE VISIT FOR ADDITIONAL REFILLS 30 tablet 2   ipratropium (ATROVENT) 0.06 % nasal spray Place 1 spray into the nose 3 (three) times daily as needed for rhinitis. 15 mL 0   levocetirizine (XYZAL) 5 MG tablet Take 5 mg by mouth every evening.     LORazepam (ATIVAN) 1 MG tablet TAKE 2-3 TABLETS AT BEDTIME AND 1-2 TABLETS IN THE DAYTIME AS NEEDED. 150 tablet 1   montelukast (SINGULAIR) 10 MG tablet Take 1 tablet (10 mg total) by mouth at bedtime. 90 tablet 1   pantoprazole (PROTONIX) 40 MG tablet TAKE (1) TABLET TWICE A DAY BEFORE MEALS. 60 tablet 5   simvastatin (ZOCOR) 40 MG tablet TAKE 1 TABLET ONCE DAILY. 90 tablet 3   SYNTHROID 50 MCG tablet TAKE 1 TABLET ONCE DAILY BEFORE BREAKFAST. 30 tablet 5   traMADol (ULTRAM) 50 MG tablet Take by mouth every 6 (six) hours as needed.     triamcinolone (NASACORT) 55 MCG/ACT AERO  nasal inhaler Place 2 sprays into the nose daily.     VESICARE 10 MG tablet Take 10 mg by mouth daily as needed (bladder).     estradiol (ESTRACE) 0.1 MG/GM vaginal cream Place 1 Applicatorful vaginally See admin instructions. Once every few weeks (Patient not taking: Reported on 08/07/2021)     gabapentin (NEURONTIN) 100 MG capsule Take 100 mg by mouth at bedtime.     No current facility-administered medications for this visit.    Family History  Problem Relation Age of Onset   Heart disease Mother    Hypertension Mother    Osteoporosis Mother    Congestive Heart Failure Mother    Pancreatic cancer Father    Heart attack Brother    Drug abuse Brother        over dose    Healthy Son    Healthy Son    Colon cancer Neg Hx    Colon polyps Neg Hx    Rectal cancer Neg Hx    Stomach cancer Neg Hx     Review of Systems  Exam:   BP 136/82   Pulse 88   Ht '4\' 11"'$  (1.499 m)   Wt 117 lb (53.1 kg)   SpO2 99%   BMI 23.63 kg/m   Weight change: '@WEIGHTCHANGE'$ @ Height:   Height: '4\' 11"'$  (149.9 cm)  Ht Readings from Last 3 Encounters:  08/07/21 '4\' 11"'$  (1.499 m)  05/15/21 5' (1.524 m)  05/13/21 5' (1.524 m)    General appearance: alert, cooperative and appears stated age Head: Normocephalic, without obvious abnormality, atraumatic Neck: no adenopathy, supple, symmetrical, trachea midline and thyroid normal to inspection and palpation Breasts: normal appearance, no masses or tenderness Abdomen: soft, non-tender; non distended,  no masses,  no organomegaly Extremities: extremities normal, atraumatic, no cyanosis or edema Skin: Skin color, texture, turgor normal. No rashes or lesions Lymph nodes: Cervical, supraclavicular, and axillary nodes normal. No abnormal inguinal nodes palpated Neurologic: Grossly normal   Pelvic: External genitalia:  no lesions              Urethra:  normal appearing urethra with no masses, tenderness or lesions              Bartholins and Skenes: normal                  Vagina: atrophic appearing vagina with normal color and discharge, no lesions              Cervix: no lesions  Bimanual Exam:  Uterus:  normal size, contour, position, consistency, mobility, non-tender              Adnexa: no mass, fullness, tenderness               Rectovaginal: Confirms               Anus:  normal sphincter tone, no lesions. No perianal erythema or whitening.   Gae Dry chaperoned for the exam.  1. Encounter for gynecological examination with abnormal finding Mammogram UTD Colon cancer screening with her primary Labs with primary Discussed breast self exam Discussed calcium and vit D intake  2. Incontinence of feces, unspecified fecal incontinence type Start metamucil Kegel information given  3. Vaginal atrophy - estradiol (ESTRACE) 0.1 MG/GM vaginal cream; Place 1 gram vaginally 2 x a week at hs  Dispense: 42.5 g; Refill: 1  4. History of osteoporosis On prolia Getting calcium and vit d DEXA in 12/23

## 2021-08-10 DIAGNOSIS — G9520 Unspecified cord compression: Secondary | ICD-10-CM | POA: Diagnosis not present

## 2021-08-10 DIAGNOSIS — R030 Elevated blood-pressure reading, without diagnosis of hypertension: Secondary | ICD-10-CM | POA: Diagnosis not present

## 2021-08-10 DIAGNOSIS — S12001K Unspecified nondisplaced fracture of first cervical vertebra, subsequent encounter for fracture with nonunion: Secondary | ICD-10-CM | POA: Diagnosis not present

## 2021-08-10 DIAGNOSIS — M542 Cervicalgia: Secondary | ICD-10-CM | POA: Diagnosis not present

## 2021-08-12 LAB — CYTOLOGY - PAP: Diagnosis: NEGATIVE

## 2021-08-13 ENCOUNTER — Other Ambulatory Visit (INDEPENDENT_AMBULATORY_CARE_PROVIDER_SITE_OTHER): Payer: Medicare Other

## 2021-08-13 ENCOUNTER — Other Ambulatory Visit: Payer: Self-pay

## 2021-08-13 DIAGNOSIS — F419 Anxiety disorder, unspecified: Secondary | ICD-10-CM

## 2021-08-13 DIAGNOSIS — R5382 Chronic fatigue, unspecified: Secondary | ICD-10-CM | POA: Diagnosis not present

## 2021-08-13 DIAGNOSIS — E785 Hyperlipidemia, unspecified: Secondary | ICD-10-CM

## 2021-08-13 LAB — URINALYSIS, ROUTINE W REFLEX MICROSCOPIC
Bilirubin Urine: NEGATIVE
Hgb urine dipstick: NEGATIVE
Ketones, ur: NEGATIVE
Nitrite: NEGATIVE
RBC / HPF: NONE SEEN (ref 0–?)
Specific Gravity, Urine: 1.01 (ref 1.000–1.030)
Total Protein, Urine: NEGATIVE
Urine Glucose: NEGATIVE
Urobilinogen, UA: 0.2 (ref 0.0–1.0)
pH: 7 (ref 5.0–8.0)

## 2021-08-13 LAB — CBC WITH DIFFERENTIAL/PLATELET
Basophils Absolute: 0 10*3/uL (ref 0.0–0.1)
Basophils Relative: 0.7 % (ref 0.0–3.0)
Eosinophils Absolute: 0.2 10*3/uL (ref 0.0–0.7)
Eosinophils Relative: 2.6 % (ref 0.0–5.0)
HCT: 40.7 % (ref 36.0–46.0)
Hemoglobin: 13.6 g/dL (ref 12.0–15.0)
Lymphocytes Relative: 18.5 % (ref 12.0–46.0)
Lymphs Abs: 1.2 10*3/uL (ref 0.7–4.0)
MCHC: 33.3 g/dL (ref 30.0–36.0)
MCV: 92.8 fl (ref 78.0–100.0)
Monocytes Absolute: 0.7 10*3/uL (ref 0.1–1.0)
Monocytes Relative: 10.4 % (ref 3.0–12.0)
Neutro Abs: 4.4 10*3/uL (ref 1.4–7.7)
Neutrophils Relative %: 67.8 % (ref 43.0–77.0)
Platelets: 226 10*3/uL (ref 150.0–400.0)
RBC: 4.39 Mil/uL (ref 3.87–5.11)
RDW: 14.3 % (ref 11.5–15.5)
WBC: 6.5 10*3/uL (ref 4.0–10.5)

## 2021-08-13 LAB — COMPREHENSIVE METABOLIC PANEL
ALT: 17 U/L (ref 0–35)
AST: 21 U/L (ref 0–37)
Albumin: 4.3 g/dL (ref 3.5–5.2)
Alkaline Phosphatase: 41 U/L (ref 39–117)
BUN: 12 mg/dL (ref 6–23)
CO2: 27 mEq/L (ref 19–32)
Calcium: 9.7 mg/dL (ref 8.4–10.5)
Chloride: 101 mEq/L (ref 96–112)
Creatinine, Ser: 0.71 mg/dL (ref 0.40–1.20)
GFR: 78.8 mL/min (ref >60.00)
Glucose, Bld: 91 mg/dL (ref 70–99)
Potassium: 4.2 mEq/L (ref 3.5–5.1)
Sodium: 138 mEq/L (ref 135–145)
Total Bilirubin: 0.6 mg/dL (ref 0.2–1.2)
Total Protein: 7.5 g/dL (ref 6.0–8.3)

## 2021-08-13 LAB — LIPID PANEL
Cholesterol: 187 mg/dL (ref 0–200)
HDL: 52.9 mg/dL (ref >39.00)
NonHDL: 134.42
Total CHOL/HDL Ratio: 4
Triglycerides: 234 mg/dL — ABNORMAL HIGH (ref 0.0–149.0)
VLDL: 46.8 mg/dL — ABNORMAL HIGH (ref 0.0–40.0)

## 2021-08-13 LAB — LDL CHOLESTEROL, DIRECT: Direct LDL: 98 mg/dL

## 2021-08-13 LAB — TSH: TSH: 2.38 u[IU]/mL (ref 0.35–5.50)

## 2021-08-19 ENCOUNTER — Ambulatory Visit (INDEPENDENT_AMBULATORY_CARE_PROVIDER_SITE_OTHER): Payer: Medicare Other | Admitting: Internal Medicine

## 2021-08-19 ENCOUNTER — Encounter: Payer: Self-pay | Admitting: Internal Medicine

## 2021-08-19 ENCOUNTER — Other Ambulatory Visit: Payer: Self-pay

## 2021-08-19 DIAGNOSIS — E785 Hyperlipidemia, unspecified: Secondary | ICD-10-CM | POA: Diagnosis not present

## 2021-08-19 DIAGNOSIS — L299 Pruritus, unspecified: Secondary | ICD-10-CM | POA: Diagnosis not present

## 2021-08-19 DIAGNOSIS — I10 Essential (primary) hypertension: Secondary | ICD-10-CM | POA: Diagnosis not present

## 2021-08-19 DIAGNOSIS — M542 Cervicalgia: Secondary | ICD-10-CM

## 2021-08-19 MED ORDER — CLOBETASOL PROPIONATE 0.05 % EX SOLN: 1.0000 "application " | Freq: Two times a day (BID) | CUTANEOUS | 1 refills | Status: DC | PRN

## 2021-08-19 NOTE — Assessment & Plan Note (Signed)
Stiff neck Will start PT

## 2021-08-19 NOTE — Assessment & Plan Note (Signed)
Chronic, mild NAS diet

## 2021-08-19 NOTE — Assessment & Plan Note (Signed)
Post op Try Clobetasol sol On Gabapentin at HS

## 2021-08-19 NOTE — Assessment & Plan Note (Signed)
Cont on Simvastatin 

## 2021-08-19 NOTE — Progress Notes (Signed)
Subjective:  Patient ID: [name], female    DOB: 10-25-1936  Age: 85 y.o. MRN: [id number]  CC: Follow-up (2 MONTH F/U)   HPI [name] presents for scalp itching - off and on after neck surgery (on Gabapentin).  F/u neck pain C/o rash on R side   Outpatient Medications Prior to Visit  Medication Sig Dispense Refill   acetaminophen (TYLENOL) 500 MG tablet Take 500 mg by mouth 2 (two) times daily as needed for mild pain.     azelastine (ASTELIN) 0.1 % nasal spray Place 1 spray into both nostrils at bedtime.     betamethasone valerate ointment (VALISONE) 0.1 % Apply 1 application topically 2 (two) times daily. You can use prn itch for up to one week as needed. Not meant for daily long term use. 30 g 0   Calcium Carbonate-Vitamin D (CALCIUM-VITAMIN D) 500-200 MG-UNIT per tablet Take 3 tablets by mouth daily.     desonide (DESOWEN) 0.05 % cream Apply 1 application topically as needed.     diclofenac Sodium (VOLTAREN) 1 % GEL APPLY 4 GRAMS 4 TIMES DAILY. 200 g 3   EPINEPHrine (EPIPEN 2-PAK) 0.3 mg/0.3 mL IJ SOAJ injection Inject 0.3 mLs (0.3 mg total) into the muscle as needed. 1 Device 1   estradiol (ESTRACE) 0.1 MG/GM vaginal cream Place 1 gram vaginally 2 x a week at hs 42.5 g 1   famotidine (PEPCID) 40 MG tablet Take 1 tablet (40 mg total) by mouth at bedtime. NEEDS OFFICE VISIT FOR ADDITIONAL REFILLS 30 tablet 2   gabapentin (NEURONTIN) 100 MG capsule Take 100 mg by mouth at bedtime.     ipratropium (ATROVENT) 0.06 % nasal spray Place 1 spray into the nose 3 (three) times daily as needed for rhinitis. 15 mL 0   levocetirizine (XYZAL) 5 MG tablet Take 5 mg by mouth every evening.     LORazepam (ATIVAN) 1 MG tablet TAKE 2-3 TABLETS AT BEDTIME AND 1-2 TABLETS IN THE DAYTIME AS NEEDED. 150 tablet 1   montelukast (SINGULAIR) 10 MG tablet Take 1 tablet (10 mg total) by mouth at bedtime. 90 tablet 1   pantoprazole (PROTONIX) 40 MG tablet TAKE (1) TABLET TWICE A DAY BEFORE MEALS. 60 tablet  5   simvastatin (ZOCOR) 40 MG tablet TAKE 1 TABLET ONCE DAILY. 90 tablet 3   SYNTHROID 50 MCG tablet TAKE 1 TABLET ONCE DAILY BEFORE BREAKFAST. 30 tablet 5   traMADol (ULTRAM) 50 MG tablet Take by mouth every 6 (six) hours as needed.     triamcinolone (NASACORT) 55 MCG/ACT AERO nasal inhaler Place 2 sprays into the nose daily.     triamcinolone cream (KENALOG) 0.1 % Apply 1 application topically 2 (two) times daily. APPLY TOPICALLY TWICE A DAY     VESICARE 10 MG tablet Take 10 mg by mouth daily as needed (bladder).     No facility-administered medications prior to visit.    ROS: Review of Systems  Constitutional:  Negative for activity change, appetite change, chills, fatigue and unexpected weight change.  HENT:  Negative for congestion, mouth sores and sinus pressure.   Eyes:  Negative for visual disturbance.  Respiratory:  Negative for cough and chest tightness.   Gastrointestinal:  Negative for abdominal pain and nausea.  Genitourinary:  Negative for difficulty urinating, frequency and vaginal pain.  Musculoskeletal:  Positive for arthralgias, neck pain and neck stiffness. Negative for back pain and gait problem.  Skin:  Negative for pallor and rash.  Neurological:  Negative  for dizziness, tremors, weakness, numbness and headaches.  Psychiatric/Behavioral:  Negative for confusion, sleep disturbance and suicidal ideas. The patient is nervous/anxious.    Objective:  BP 120/70 (BP Location: Left Arm)   Pulse 91   Temp 98.1 F (36.7 C) (Oral)   Ht 4\' 11"  (1.499 m)   Wt 116 lb 9.6 oz (52.9 kg)   SpO2 94%   BMI 23.55 kg/m   BP Readings from Last 3 Encounters:  08/19/21 120/70  08/07/21 136/82  06/16/21 140/72    Wt Readings from Last 3 Encounters:  08/19/21 116 lb 9.6 oz (52.9 kg)  08/07/21 117 lb (53.1 kg)  06/16/21 119 lb 9.6 oz (54.3 kg)    Physical Exam Constitutional:      General: She is not in acute distress.    Appearance: She is well-developed.  HENT:      Head: Normocephalic.     Right Ear: External ear normal.     Left Ear: External ear normal.     Nose: Nose normal.  Eyes:     General:        Right eye: No discharge.        Left eye: No discharge.     Conjunctiva/sclera: Conjunctivae normal.     Pupils: Pupils are equal, round, and reactive to light.  Neck:     Thyroid: No thyromegaly.     Vascular: No JVD.     Trachea: No tracheal deviation.  Cardiovascular:     Rate and Rhythm: Normal rate and regular rhythm.     Heart sounds: Normal heart sounds.  Pulmonary:     Effort: No respiratory distress.     Breath sounds: No stridor. No wheezing.  Abdominal:     General: Bowel sounds are normal. There is no distension.     Palpations: Abdomen is soft. There is no mass.     Tenderness: no abdominal tenderness There is no guarding or rebound.  Musculoskeletal:        General: No tenderness.     Cervical back: Normal range of motion and neck supple. No rigidity.  Lymphadenopathy:     Cervical: No cervical adenopathy.  Skin:    Findings: No erythema or rash.  Neurological:     Mental Status: She is oriented to person, place, and time.     Cranial Nerves: No cranial nerve deficit.     Motor: No abnormal muscle tone.     Coordination: Coordination normal.     Deep Tendon Reflexes: Reflexes normal.  Psychiatric:        Behavior: Behavior normal.        Thought Content: Thought content normal.        Judgment: Judgment normal.   Stiff neck Scaly skin - upper neck  Lab Results  Component Value Date   WBC 6.5 08/13/2021   HGB 13.6 08/13/2021   HCT 40.7 08/13/2021   PLT 226.0 R 08/13/2021   GLUCOSE 91 08/13/2021   CHOL 187 08/13/2021   TRIG 234.0 (H) 08/13/2021   HDL 52.90 08/13/2021   LDLDIRECT 98.0 08/13/2021   LDLCALC 108 (H) 03/23/2021   ALT 17 08/13/2021   AST 21 08/13/2021   NA 138 08/13/2021   K 4.2 08/13/2021   CL 101 08/13/2021   CREATININE 0.71 08/13/2021   BUN 12 08/13/2021   CO2 27 08/13/2021   TSH 2.38  08/13/2021   HGBA1C 5.5 06/13/2020    No results found.  Assessment & Plan:   Problem List  Items Addressed This Visit     Dyslipidemia    Cont on Simvastatin       Essential hypertension, benign    Chronic, mild NAS diet      Neck pain    Stiff neck Will start PT       Scalp itch    Post op Try Clobetasol sol On Gabapentin at HS         Follow-up: No follow-ups on file.  Walker Kehr, MD

## 2021-08-21 DIAGNOSIS — Z23 Encounter for immunization: Secondary | ICD-10-CM | POA: Diagnosis not present

## 2021-09-07 NOTE — Progress Notes (Deleted)
Office Visit Note  Patient: [name]             Date of Birth: Aug 18, 1936           MRN: [id number]             PCP: Cassandria Anger, MD Referring: Cassandria Anger, MD Visit Date: 09/21/2021 Occupation: @GUAROCC @  Subjective:  No chief complaint on file.   History of Present Illness: [name] is a 85 y.o. female ***   Activities of Daily Living:  Patient reports morning stiffness for *** {minute/hour:19697}.   Patient {ACTIONS;DENIES/REPORTS:21021675::"Denies"} nocturnal pain.  Difficulty dressing/grooming: {ACTIONS;DENIES/REPORTS:21021675::"Denies"} Difficulty climbing stairs: {ACTIONS;DENIES/REPORTS:21021675::"Denies"} Difficulty getting out of chair: {ACTIONS;DENIES/REPORTS:21021675::"Denies"} Difficulty using hands for taps, buttons, cutlery, and/or writing: {ACTIONS;DENIES/REPORTS:21021675::"Denies"}  No Rheumatology ROS completed.   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 05/13/2021   Cervical spinal cord compression ([place]) 05/04/2021   Fall against object 03/25/2021   Hematoma of face, initial encounter 03/25/2021   Degenerative disc disease, cervical 03/11/2021   Spondylolisthesis, cervical region 03/11/2021   Trochanteric bursitis of right hip 09/22/2020   Rheumatoid arthritis involving multiple sites ([place]) 08/20/2020   Elevated blood-pressure reading, without diagnosis of hypertension 07/14/2020   Meningioma ([place]) 03/25/2020   Arthralgia 03/18/2020   RUQ pain 01/02/2020   Cholelithiasis 09/09/2019   Ear  pain, bilateral 03/21/2019   Cervical spondylosis 01/05/2019   Neck pain 12/26/2018   Acute pain of left shoulder 12/26/2018   Anxiety 08/22/2018   Contact dermatitis and eczema 06/14/2018   Constipation 11/11/2016   Low back pain 11/10/2016   Leg abrasion 08/12/2016   Contusion of right knee 08/12/2016   Sinusitis, chronic 08/06/2016   Rash and nonspecific skin eruption 04/20/2016   Aortic valve sclerosis 03/12/2015   Contusion of left chest wall 01/09/2015   Fatigue 09/08/2014   Bladder pain 08/19/2014   Increased frequency of urination 08/19/2014   Urinary urgency 08/19/2014   Postherpetic neuralgia 10/02/2013   Impacted cerumen of right ear 07/16/2013   Headache 07/10/2013   Aortic stenosis 04/19/2013   Insomnia 01/24/2013   Cough 08/07/2012   URI (upper respiratory infection) 08/03/2012   Pruritus of skin 03/11/2012   MVP (mitral valve prolapse)    Multiple thyroid nodules 10/04/2011   Herpes zoster 09/07/2011   Essential hypertension, benign 07/27/2011   Atrophic vaginitis    Well adult exam 03/15/2011   Hyperlipemia, mixed 03/15/2011   OVERACTIVE BLADDER 09/02/2010   Hypothyroidism 03/03/2010   CALF PAIN, LEFT 10/27/2009   Belching 07/21/2009   EAR PAIN 12/19/2008   Allergic rhinitis 12/19/2008   Irritable bowel syndrome 09/04/2008   PERSONAL HX COLONIC POLYPS 09/04/2008   GASTRITIS, CHRONIC 09/03/2008   DUODENITIS, WITHOUT HEMORRHAGE 09/03/2008   TIBIALIS TENDINITIS 03/20/2008   OSTEOARTHRITIS 02/25/2008   SWEATING 02/21/2008   LACTOSE INTOLERANCE 11/20/2007   Dyslipidemia 11/20/2007   GERD 11/20/2007   Osteoporosis 11/20/2007    Past Medical History:  Diagnosis Date   Allergic rhinitis    Asthma    Atrophic vaginitis    Baker's cyst  Left-Dr. Aluisio   Cataract    Dr. Katy Fitch   CHF (congestive heart failure) (HCC)    Colon polyps    Fibroid    Gastritis    chronic   GERD (gastroesophageal reflux disease)    Heart murmur    Hemorrhoids     Hyperlipidemia    Hypothyroidism    IBS (irritable bowel syndrome)    MVP (mitral valve prolapse)    Antibiotics required for dental procedures   OA (osteoarthritis)    Osteoporosis 06/2018   T score -2.2 stable on Prolia   Overactive bladder    Thyroid disease    Thyroid nodule    Torn meniscus    bilateral    Family History  Problem Relation Age of Onset   Heart disease Mother    Hypertension Mother    Osteoporosis Mother    Congestive Heart Failure Mother    Pancreatic cancer Father    Heart attack Brother    Drug abuse Brother        over dose    Healthy Son    Healthy Son    Colon cancer Neg Hx    Colon polyps Neg Hx    Rectal cancer Neg Hx    Stomach cancer Neg Hx    Past Surgical History:  Procedure Laterality Date   CATARACT EXTRACTION, BILATERAL     EYE SURGERY     POSTERIOR CERVICAL FUSION/FORAMINOTOMY N/A 05/15/2021   Procedure: Cervical One Laminectomy with resection of cyst, Fixation from Occiput to Cervical Four;  Surgeon: Erline Levine, MD;  Location: [facility];  Service: Neurosurgery;  Laterality: N/A;   TONSILLECTOMY     Social History   Social History Narrative   Regular exercise-yes   Daily caffeine use   Immunization History  Administered Date(s) Administered   Fluad Quad(high Dose 65+) 08/04/2019   Influenza Whole 09/29/2007, 08/21/2008, 09/02/2010, 07/30/2012   Influenza, High Dose Seasonal PF 09/03/2013, 02/19/2015, 11/12/2015, 08/06/2016, 11/11/2016, 08/20/2017, 08/31/2017, 09/20/2018, 09/12/2019   Influenza,inj,Quad PF,6+ Mos 12/17/2014, 07/30/2015   PFIZER(Purple Top)SARS-COV-2 Vaccination 12/12/2019, 12/31/2019, 07/29/2020, 07/29/2020   Pneumococcal Conjugate-13 01/09/2014   Pneumococcal Polysaccharide-23 09/26/2006, 08/12/2015, 11/11/2016, 08/31/2017, 09/10/2020   Td 04/14/2010   Tdap 06/18/2020   Typhoid Inactivated 02/23/2013   Zoster, Live 12/09/2006     Objective: Vital Signs: There were no vitals taken for this visit.    Physical Exam   Musculoskeletal Exam: ***  CDAI Exam: CDAI Score: -- Patient Global: --; Provider Global: -- Swollen: --; Tender: -- Joint Exam 09/21/2021   No joint exam has been documented for this visit   There is currently no information documented on the homunculus. Go to the Rheumatology activity and complete the homunculus joint exam.  Investigation: No additional findings.  Imaging: No results found.  Recent Labs: Lab Results  Component Value Date   WBC 6.5 08/13/2021   HGB 13.6 08/13/2021   PLT 226.0 R 08/13/2021   NA 138 08/13/2021   K 4.2 08/13/2021   CL 101 08/13/2021   CO2 27 08/13/2021   GLUCOSE 91 08/13/2021   BUN 12 08/13/2021   CREATININE 0.71 08/13/2021   BILITOT 0.6 08/13/2021   ALKPHOS 41 08/13/2021   AST 21 08/13/2021   ALT 17 08/13/2021   PROT 7.5 08/13/2021   ALBUMIN 4.3 08/13/2021   CALCIUM 9.7 08/13/2021   GFRAA 87 07/17/2020    Speciality Comments: No specialty comments available.  Procedures:  No procedures performed Allergies: Bee venom, Clarithromycin, Doxycycline, Neosporin [neomycin-bacitracin zn-polymyx], and  Hydrocortisone   Assessment / Plan:     Visit Diagnoses: No diagnosis found.  Orders: No orders of the defined types were placed in this encounter.  No orders of the defined types were placed in this encounter.   Face-to-face time spent with patient was *** minutes. Greater than 50% of time was spent in counseling and coordination of care.  Follow-Up Instructions: No follow-ups on file.   Earnestine Mealing, CMA  Note - This record has been created using Editor, commissioning.  Chart creation errors have been sought, but may not always  have been located. Such creation errors do not reflect on  the standard of medical care.

## 2021-09-10 DIAGNOSIS — T63441D Toxic effect of venom of bees, accidental (unintentional), subsequent encounter: Secondary | ICD-10-CM | POA: Diagnosis not present

## 2021-09-10 DIAGNOSIS — J4520 Mild intermittent asthma, uncomplicated: Secondary | ICD-10-CM | POA: Diagnosis not present

## 2021-09-10 DIAGNOSIS — J3089 Other allergic rhinitis: Secondary | ICD-10-CM | POA: Diagnosis not present

## 2021-09-10 DIAGNOSIS — J301 Allergic rhinitis due to pollen: Secondary | ICD-10-CM | POA: Diagnosis not present

## 2021-09-11 NOTE — Progress Notes (Deleted)
Office Visit Note  Patient: [name]             Date of Birth: [birth year]           MRN: [id number]             PCP: Cassandria Anger, MD Referring: Cassandria Anger, MD Visit Date: 09/15/2021 Occupation: @GUAROCC @  Subjective:  No chief complaint on file.   History of Present Illness: [name] is a 85 y.o. female ***   Activities of Daily Living:  Patient reports morning stiffness for *** {minute/hour:19697}.   Patient {ACTIONS;DENIES/REPORTS:21021675::"Denies"} nocturnal pain.  Difficulty dressing/grooming: {ACTIONS;DENIES/REPORTS:21021675::"Denies"} Difficulty climbing stairs: {ACTIONS;DENIES/REPORTS:21021675::"Denies"} Difficulty getting out of chair: {ACTIONS;DENIES/REPORTS:21021675::"Denies"} Difficulty using hands for taps, buttons, cutlery, and/or writing: {ACTIONS;DENIES/REPORTS:21021675::"Denies"}  No Rheumatology ROS completed.   PMFS History:  Patient Active Problem List   Diagnosis Date Noted   Scalp itch 08/19/2021   Allergic rhinitis due to animal (cat) (dog) hair and dander 08/07/2021   Allergic rhinitis due to pollen 08/07/2021   Mild intermittent asthma 08/07/2021   Closed fracture of first cervical vertebra ([place]) 08/07/2021   Closed fracture of second cervical vertebra ([place]) 08/07/2021   Cervical spine instability 05/15/2021   Closed 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 05/13/2021   Cervical spinal cord compression ([place]) 05/04/2021   Fall against object 03/25/2021   Hematoma of face, initial encounter 03/25/2021   Degenerative disc disease, cervical 03/11/2021   Spondylolisthesis, cervical region 03/11/2021   Trochanteric bursitis of right hip 09/22/2020   Rheumatoid arthritis involving multiple sites ([place]) 08/20/2020   Elevated blood-pressure reading, without diagnosis of hypertension 07/14/2020   Meningioma ([place]) 03/25/2020   Arthralgia 03/18/2020   RUQ pain 01/02/2020   Cholelithiasis 09/09/2019   Ear  pain, bilateral 03/21/2019   Cervical spondylosis 01/05/2019   Neck pain 12/26/2018   Acute pain of left shoulder 12/26/2018   Anxiety 08/22/2018   Contact dermatitis and eczema 06/14/2018   Constipation 11/11/2016   Low back pain 11/10/2016   Leg abrasion 08/12/2016   Contusion of right knee 08/12/2016   Sinusitis, chronic 08/06/2016   Rash and nonspecific skin eruption 04/20/2016   Aortic valve sclerosis 03/12/2015   Contusion of left chest wall 01/09/2015   Fatigue 09/08/2014   Bladder pain 08/19/2014   Increased frequency of urination 08/19/2014   Urinary urgency 08/19/2014   Postherpetic neuralgia 10/02/2013   Impacted cerumen of right ear 07/16/2013   Headache 07/10/2013   Aortic stenosis 04/19/2013   Insomnia 01/24/2013   Cough 08/07/2012   URI (upper respiratory infection) 08/03/2012   Pruritus of skin 03/11/2012   MVP (mitral valve prolapse)    Multiple thyroid nodules 10/04/2011   Herpes zoster 09/07/2011   Essential hypertension, benign 07/27/2011   Atrophic vaginitis    Well adult exam 03/15/2011   Hyperlipemia, mixed 03/15/2011   OVERACTIVE BLADDER 09/02/2010   Hypothyroidism 03/03/2010   CALF PAIN, LEFT 10/27/2009   Belching 07/21/2009   EAR PAIN 12/19/2008   Allergic rhinitis 12/19/2008   Irritable bowel syndrome 09/04/2008   PERSONAL HX COLONIC POLYPS 09/04/2008   GASTRITIS, CHRONIC 09/03/2008   DUODENITIS, WITHOUT HEMORRHAGE 09/03/2008   TIBIALIS TENDINITIS 03/20/2008   OSTEOARTHRITIS 02/25/2008   SWEATING 02/21/2008   LACTOSE INTOLERANCE 11/20/2007   Dyslipidemia 11/20/2007   GERD 11/20/2007   Osteoporosis 11/20/2007    Past Medical History:  Diagnosis Date   Allergic rhinitis    Asthma    Atrophic vaginitis    Baker's cyst  Left-Dr. Aluisio   Cataract    Dr. Katy Fitch   CHF (congestive heart failure) (HCC)    Colon polyps    Fibroid    Gastritis    chronic   GERD (gastroesophageal reflux disease)    Heart murmur    Hemorrhoids     Hyperlipidemia    Hypothyroidism    IBS (irritable bowel syndrome)    MVP (mitral valve prolapse)    Antibiotics required for dental procedures   OA (osteoarthritis)    Osteoporosis 06/2018   T score -2.2 stable on Prolia   Overactive bladder    Thyroid disease    Thyroid nodule    Torn meniscus    bilateral    Family History  Problem Relation Age of Onset   Heart disease Mother    Hypertension Mother    Osteoporosis Mother    Congestive Heart Failure Mother    Pancreatic cancer Father    Heart attack Brother    Drug abuse Brother        over dose    Healthy Son    Healthy Son    Colon cancer Neg Hx    Colon polyps Neg Hx    Rectal cancer Neg Hx    Stomach cancer Neg Hx    Past Surgical History:  Procedure Laterality Date   CATARACT EXTRACTION, BILATERAL     EYE SURGERY     POSTERIOR CERVICAL FUSION/FORAMINOTOMY N/A 05/15/2021   Procedure: Cervical One Laminectomy with resection of cyst, Fixation from Occiput to Cervical Four;  Surgeon: Erline Levine, MD;  Location: [facility];  Service: Neurosurgery;  Laterality: N/A;   TONSILLECTOMY     Social History   Social History Narrative   Regular exercise-yes   Daily caffeine use   Immunization History  Administered Date(s) Administered   Fluad Quad(high Dose 65+) 08/04/2019   Influenza Whole 09/29/2007, 08/21/2008, 09/02/2010, 07/30/2012   Influenza, High Dose Seasonal PF 09/03/2013, 02/19/2015, 11/12/2015, 08/06/2016, 11/11/2016, 08/20/2017, 08/31/2017, 09/20/2018, 09/12/2019   Influenza,inj,Quad PF,6+ Mos 12/17/2014, 07/30/2015   PFIZER(Purple Top)SARS-COV-2 Vaccination 12/12/2019, 12/31/2019, 07/29/2020, 07/29/2020   Pneumococcal Conjugate-13 01/09/2014   Pneumococcal Polysaccharide-23 09/26/2006, 08/12/2015, 11/11/2016, 08/31/2017, 09/10/2020   Td 04/14/2010   Tdap 06/18/2020   Typhoid Inactivated 02/23/2013   Zoster, Live 12/09/2006     Objective: Vital Signs: There were no vitals taken for this visit.    Physical Exam   Musculoskeletal Exam: ***  CDAI Exam: CDAI Score: -- Patient Global: --; Provider Global: -- Swollen: --; Tender: -- Joint Exam 09/15/2021   No joint exam has been documented for this visit   There is currently no information documented on the homunculus. Go to the Rheumatology activity and complete the homunculus joint exam.  Investigation: No additional findings.  Imaging: No results found.  Recent Labs: Lab Results  Component Value Date   WBC 6.5 08/13/2021   HGB 13.6 08/13/2021   PLT 226.0 R 08/13/2021   NA 138 08/13/2021   K 4.2 08/13/2021   CL 101 08/13/2021   CO2 27 08/13/2021   GLUCOSE 91 08/13/2021   BUN 12 08/13/2021   CREATININE 0.71 08/13/2021   BILITOT 0.6 08/13/2021   ALKPHOS 41 08/13/2021   AST 21 08/13/2021   ALT 17 08/13/2021   PROT 7.5 08/13/2021   ALBUMIN 4.3 08/13/2021   CALCIUM 9.7 08/13/2021   GFRAA 87 07/17/2020    Speciality Comments: No specialty comments available.  Procedures:  No procedures performed Allergies: Bee venom, Clarithromycin, Doxycycline, Neosporin [neomycin-bacitracin zn-polymyx], and  Hydrocortisone   Assessment / Plan:     Visit Diagnoses: No diagnosis found.  Orders: No orders of the defined types were placed in this encounter.  No orders of the defined types were placed in this encounter.   Face-to-face time spent with patient was *** minutes. Greater than 50% of time was spent in counseling and coordination of care.  Follow-Up Instructions: No follow-ups on file.   Earnestine Mealing, CMA  Note - This record has been created using Editor, commissioning.  Chart creation errors have been sought, but may not always  have been located. Such creation errors do not reflect on  the standard of medical care.

## 2021-09-15 ENCOUNTER — Encounter: Payer: Self-pay | Admitting: Rheumatology

## 2021-09-15 ENCOUNTER — Other Ambulatory Visit: Payer: Self-pay

## 2021-09-15 ENCOUNTER — Ambulatory Visit (INDEPENDENT_AMBULATORY_CARE_PROVIDER_SITE_OTHER): Payer: Medicare Other | Admitting: Rheumatology

## 2021-09-15 VITALS — BP 118/75 | HR 78 | Resp 12 | Ht <= 58 in | Wt 119.0 lb

## 2021-09-15 DIAGNOSIS — M532X1 Spinal instabilities, occipito-atlanto-axial region: Secondary | ICD-10-CM

## 2021-09-15 DIAGNOSIS — M533 Sacrococcygeal disorders, not elsewhere classified: Secondary | ICD-10-CM | POA: Diagnosis not present

## 2021-09-15 DIAGNOSIS — M19072 Primary osteoarthritis, left ankle and foot: Secondary | ICD-10-CM

## 2021-09-15 DIAGNOSIS — E042 Nontoxic multinodular goiter: Secondary | ICD-10-CM

## 2021-09-15 DIAGNOSIS — Z860100 Personal history of colon polyps, unspecified: Secondary | ICD-10-CM

## 2021-09-15 DIAGNOSIS — Z8719 Personal history of other diseases of the digestive system: Secondary | ICD-10-CM

## 2021-09-15 DIAGNOSIS — I341 Nonrheumatic mitral (valve) prolapse: Secondary | ICD-10-CM | POA: Diagnosis not present

## 2021-09-15 DIAGNOSIS — M810 Age-related osteoporosis without current pathological fracture: Secondary | ICD-10-CM

## 2021-09-15 DIAGNOSIS — M7062 Trochanteric bursitis, left hip: Secondary | ICD-10-CM | POA: Diagnosis not present

## 2021-09-15 DIAGNOSIS — M5030 Other cervical disc degeneration, unspecified cervical region: Secondary | ICD-10-CM

## 2021-09-15 DIAGNOSIS — K8020 Calculus of gallbladder without cholecystitis without obstruction: Secondary | ICD-10-CM

## 2021-09-15 DIAGNOSIS — M19042 Primary osteoarthritis, left hand: Secondary | ICD-10-CM

## 2021-09-15 DIAGNOSIS — M7061 Trochanteric bursitis, right hip: Secondary | ICD-10-CM | POA: Diagnosis not present

## 2021-09-15 DIAGNOSIS — M19071 Primary osteoarthritis, right ankle and foot: Secondary | ICD-10-CM | POA: Diagnosis not present

## 2021-09-15 DIAGNOSIS — I10 Essential (primary) hypertension: Secondary | ICD-10-CM | POA: Diagnosis not present

## 2021-09-15 DIAGNOSIS — M51369 Other intervertebral disc degeneration, lumbar region without mention of lumbar back pain or lower extremity pain: Secondary | ICD-10-CM

## 2021-09-15 DIAGNOSIS — Z8639 Personal history of other endocrine, nutritional and metabolic disease: Secondary | ICD-10-CM

## 2021-09-15 DIAGNOSIS — M19041 Primary osteoarthritis, right hand: Secondary | ICD-10-CM

## 2021-09-15 DIAGNOSIS — M5136 Other intervertebral disc degeneration, lumbar region: Secondary | ICD-10-CM | POA: Diagnosis not present

## 2021-09-15 DIAGNOSIS — E034 Atrophy of thyroid (acquired): Secondary | ICD-10-CM | POA: Diagnosis not present

## 2021-09-15 DIAGNOSIS — G8929 Other chronic pain: Secondary | ICD-10-CM

## 2021-09-15 DIAGNOSIS — I358 Other nonrheumatic aortic valve disorders: Secondary | ICD-10-CM

## 2021-09-15 NOTE — Progress Notes (Signed)
Office Visit Note  Patient: [name]             Date of Birth: [birth year]           MRN: [id number]             PCP: Cassandria Anger, MD Referring: Cassandria Anger, MD Visit Date: 09/15/2021 Occupation: @GUAROCC @  Subjective:  Pain in lower back and both hips.   History of Present Illness: [name] is a 85 y.o. female with a history of degenerative disc disease, osteoarthritis and osteoporosis.  She underwent C-spine fusion in June 2022 by Dr. Vertell Limber for C1-C2 instability and disc disease.  She is gradually recovering from that.  She still has limited range of motion.  She started physical therapy but has not had much results so far.  She states that she has been having lower back pain which she describes in the SI joints and trochanteric area.  She states she had good response to cortisone injection in April 2022.  She continues to have some discomfort in her hands and her feet.  She has been taking Prolia injections for osteoporosis.  Activities of Daily Living:  Patient reports morning stiffness for 0 minute.   Patient Reports nocturnal pain.  Difficulty dressing/grooming: Denies Difficulty climbing stairs: Denies Difficulty getting out of chair: Denies Difficulty using hands for taps, buttons, cutlery, and/or writing: Denies  Review of Systems  Constitutional:  Positive for fatigue. Negative for night sweats, weight gain and weight loss.  HENT:  Positive for mouth dryness. Negative for mouth sores, trouble swallowing, trouble swallowing and nose dryness.   Eyes:  Negative for pain, redness, visual disturbance and dryness.  Respiratory:  Negative for cough, shortness of breath and difficulty breathing.   Cardiovascular:  Negative for chest pain, palpitations, hypertension, irregular heartbeat and swelling in legs/feet.  Gastrointestinal:  Negative for blood in stool, constipation and diarrhea.  Endocrine: Negative for increased urination.  Genitourinary:  Negative  for vaginal dryness.  Musculoskeletal:  Positive for joint pain, joint pain, myalgias and myalgias. Negative for joint swelling, muscle weakness, morning stiffness and muscle tenderness.  Skin:  Negative for color change, rash, hair loss, skin tightness, ulcers and sensitivity to sunlight.  Allergic/Immunologic: Negative for susceptible to infections.  Neurological:  Negative for dizziness, memory loss, night sweats and weakness.  Hematological:  Negative for swollen glands.  Psychiatric/Behavioral:  Negative for depressed mood and sleep disturbance. The patient is not nervous/anxious.    PMFS History:  Patient Active Problem List   Diagnosis Date Noted   Scalp itch 08/19/2021   Allergic rhinitis due to animal (cat) (dog) hair and dander 08/07/2021   Allergic rhinitis due to pollen 08/07/2021   Mild intermittent asthma 08/07/2021   Closed fracture of first cervical vertebra ([place]) 08/07/2021   Closed fracture of second cervical vertebra ([place]) 08/07/2021   Cervical spine instability 05/15/2021   Closed 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 05/13/2021   Cervical spinal cord compression ([place]) 05/04/2021   Fall against object 03/25/2021   Hematoma of face, initial encounter 03/25/2021   Degenerative disc disease, cervical 03/11/2021   Spondylolisthesis, cervical region 03/11/2021   Trochanteric bursitis of right hip 09/22/2020   Rheumatoid arthritis involving multiple sites ([place]) 08/20/2020   Elevated blood-pressure reading, without diagnosis of hypertension 07/14/2020   Meningioma ([place]) 03/25/2020   Arthralgia 03/18/2020   RUQ pain 01/02/2020   Cholelithiasis 09/09/2019   Ear pain, bilateral 03/21/2019   Cervical spondylosis 01/05/2019  Neck pain 12/26/2018   Acute pain of left shoulder 12/26/2018   Anxiety 08/22/2018   Contact dermatitis and eczema 06/14/2018   Constipation 11/11/2016   Low back pain 11/10/2016   Leg abrasion 08/12/2016   Contusion of  right knee 08/12/2016   Sinusitis, chronic 08/06/2016   Rash and nonspecific skin eruption 04/20/2016   Aortic valve sclerosis 03/12/2015   Contusion of left chest wall 01/09/2015   Fatigue 09/08/2014   Bladder pain 08/19/2014   Increased frequency of urination 08/19/2014   Urinary urgency 08/19/2014   Postherpetic neuralgia 10/02/2013   Impacted cerumen of right ear 07/16/2013   Headache 07/10/2013   Aortic stenosis 04/19/2013   Insomnia 01/24/2013   Cough 08/07/2012   URI (upper respiratory infection) 08/03/2012   Pruritus of skin 03/11/2012   MVP (mitral valve prolapse)    Multiple thyroid nodules 10/04/2011   Herpes zoster 09/07/2011   Essential hypertension, benign 07/27/2011   Atrophic vaginitis    Well adult exam 03/15/2011   Hyperlipemia, mixed 03/15/2011   OVERACTIVE BLADDER 09/02/2010   Hypothyroidism 03/03/2010   CALF PAIN, LEFT 10/27/2009   Belching 07/21/2009   EAR PAIN 12/19/2008   Allergic rhinitis 12/19/2008   Irritable bowel syndrome 09/04/2008   PERSONAL HX COLONIC POLYPS 09/04/2008   GASTRITIS, CHRONIC 09/03/2008   DUODENITIS, WITHOUT HEMORRHAGE 09/03/2008   TIBIALIS TENDINITIS 03/20/2008   OSTEOARTHRITIS 02/25/2008   SWEATING 02/21/2008   LACTOSE INTOLERANCE 11/20/2007   Dyslipidemia 11/20/2007   GERD 11/20/2007   Osteoporosis 11/20/2007    Past Medical History:  Diagnosis Date   Allergic rhinitis    Asthma    Atrophic vaginitis    Baker's cyst    Left-Dr. Aluisio   Cataract    Dr. Katy Fitch   CHF (congestive heart failure) (HCC)    Colon polyps    Fibroid    Gastritis    chronic   GERD (gastroesophageal reflux disease)    Heart murmur    Hemorrhoids    Hyperlipidemia    Hypothyroidism    IBS (irritable bowel syndrome)    MVP (mitral valve prolapse)    Antibiotics required for dental procedures   OA (osteoarthritis)    Osteoporosis 06/2018   T score -2.2 stable on Prolia   Overactive bladder    Thyroid disease    Thyroid nodule     Torn meniscus    bilateral    Family History  Problem Relation Age of Onset   Heart disease Mother    Hypertension Mother    Osteoporosis Mother    Congestive Heart Failure Mother    Pancreatic cancer Father    Heart attack Brother    Drug abuse Brother        over dose    Healthy Son    Healthy Son    Colon cancer Neg Hx    Colon polyps Neg Hx    Rectal cancer Neg Hx    Stomach cancer Neg Hx    Past Surgical History:  Procedure Laterality Date   CATARACT EXTRACTION, BILATERAL     EYE SURGERY     POSTERIOR CERVICAL FUSION/FORAMINOTOMY N/A 05/15/2021   Procedure: Cervical One Laminectomy with resection of cyst, Fixation from Occiput to Cervical Four;  Surgeon: Erline Levine, MD;  Location: [facility];  Service: Neurosurgery;  Laterality: N/A;   TONSILLECTOMY     Social History   Social History Narrative   Regular exercise-yes   Daily caffeine use   Immunization History  Administered Date(s) Administered  Fluad Quad(high Dose 65+) 08/04/2019   Influenza Whole 09/29/2007, 08/21/2008, 09/02/2010, 07/30/2012   Influenza, High Dose Seasonal PF 09/03/2013, 02/19/2015, 11/12/2015, 08/06/2016, 11/11/2016, 08/20/2017, 08/31/2017, 09/20/2018, 09/12/2019   Influenza,inj,Quad PF,6+ Mos 12/17/2014, 07/30/2015   PFIZER(Purple Top)SARS-COV-2 Vaccination 12/12/2019, 12/31/2019, 07/29/2020, 07/29/2020   Pneumococcal Conjugate-13 01/09/2014   Pneumococcal Polysaccharide-23 09/26/2006, 08/12/2015, 11/11/2016, 08/31/2017, 09/10/2020   Td 04/14/2010   Tdap 06/18/2020   Typhoid Inactivated 02/23/2013   Zoster, Live 12/09/2006     Objective: Vital Signs: BP 118/75 (BP Location: Left Arm, Patient Position: Sitting, Cuff Size: Small)   Pulse 78   Resp 12   Ht 4\' 9"  (1.448 m)   Wt 119 lb (54 kg)   BMI 25.75 kg/m    Physical Exam Vitals and nursing note reviewed.  Constitutional:      Appearance: She is well-developed.  HENT:     Head: Normocephalic and atraumatic.  Eyes:      Conjunctiva/sclera: Conjunctivae normal.  Cardiovascular:     Rate and Rhythm: Normal rate and regular rhythm.     Heart sounds: Normal heart sounds.  Pulmonary:     Effort: Pulmonary effort is normal.     Breath sounds: Normal breath sounds.  Abdominal:     General: Bowel sounds are normal.     Palpations: Abdomen is soft.  Musculoskeletal:     Cervical back: Normal range of motion.  Lymphadenopathy:     Cervical: No cervical adenopathy.  Skin:    General: Skin is warm and dry.     Capillary Refill: Capillary refill takes less than 2 seconds.  Neurological:     Mental Status: She is alert and oriented to person, place, and time.  Psychiatric:        Behavior: Behavior normal.     Musculoskeletal Exam: She had very limited lateral rotation.  She had bilateral trapezius spasm.  She had limited range of motion of her lumbar spine.  She tenderness over bilateral SI joints and bilateral trochanteric area.  Shoulder joints, elbow joints were distress with good range of motion.  She had bilateral PIP and DIP thickening.  Hip joints are in good range of motion.  There is no tenderness over knee joints.  There is no tenderness over ankles or MTPs.  CDAI Exam: CDAI Score: -- Patient Global: --; Provider Global: -- Swollen: --; Tender: -- Joint Exam 09/15/2021   No joint exam has been documented for this visit   There is currently no information documented on the homunculus. Go to the Rheumatology activity and complete the homunculus joint exam.  Investigation: No additional findings.  Imaging: No results found.  Recent Labs: Lab Results  Component Value Date   WBC 6.5 08/13/2021   HGB 13.6 08/13/2021   PLT 226.0 R 08/13/2021   NA 138 08/13/2021   K 4.2 08/13/2021   CL 101 08/13/2021   CO2 27 08/13/2021   GLUCOSE 91 08/13/2021   BUN 12 08/13/2021   CREATININE 0.71 08/13/2021   BILITOT 0.6 08/13/2021   ALKPHOS 41 08/13/2021   AST 21 08/13/2021   ALT 17 08/13/2021   PROT  7.5 08/13/2021   ALBUMIN 4.3 08/13/2021   CALCIUM 9.7 08/13/2021   GFRAA 87 07/17/2020    Speciality Comments: No specialty comments available.  Procedures:  Large Joint Inj: bilateral greater trochanter on 09/15/2021 3:24 PM Indications: pain Details: 27 G 1.5 in needle, lateral approach  Arthrogram: No  Medications (Right): 1.5 mL lidocaine 1 %; 40 mg triamcinolone acetonide 40 MG/ML  Aspirate (Right): 0 mL Medications (Left): 1.5 mL lidocaine 1 %; 40 mg triamcinolone acetonide 40 MG/ML Aspirate (Left): 0 mL Outcome: tolerated well, no immediate complications Procedure, treatment alternatives, risks and benefits explained, specific risks discussed. Consent was given by the patient. Immediately prior to procedure a time out was called to verify the correct patient, procedure, equipment, support staff and site/side marked as required. Patient was prepped and draped in the usual sterile fashion.    Allergies: Bee venom, Clarithromycin, Doxycycline, Neosporin [neomycin-bacitracin zn-polymyx], and Hydrocortisone   Assessment / Plan:     Visit Diagnoses: DDD (degenerative disc disease), cervical - S/p fusion for C1-C2 instability.  She has limited range of motion and it started physical therapy.  She had bilateral trapezius spasm.  She states she is getting ultrasound treatment but not getting much relief so far.  She will continue physical therapy.  I discussed possible trigger point injections in the future.  DDD (degenerative disc disease), lumbar-she continues to have some lower back pain.  Chronic SI joint pain-she had tenderness over bilateral SI joints.  Stretching exercises were demonstrated.  Use of Salonpas patches were discussed.  Trochanteric bursitis of both hips-she had tenderness over bilateral trochanteric bursa.  She states she has discomfort sleeping on her side at night.  Different treatment options were discussed.  Patient had good response with trochanteric bursa  injections in the past.  She requested repeat trochanteric bursa injections which were performed as  described above.  She tolerated the procedure well.  Postprocedure instructions were given.  Primary osteoarthritis of both hands - Dr. Sherrian Divers performed ultrasound of bilateral hands which was negative for synovitis.  All autoimmune work-up was negative.  She continues to have some stiffness in her hands.  Joint protection was discussed.  Primary osteoarthritis of both feet-she is off-and-on discomfort in her feet.  Age-related osteoporosis without current pathological fracture - She is on Prolia by her GYN.  Diet rich in calcium and vitamin D was discussed.  Essential hypertension, benign-blood pressure was normal today.  Other medical problems are listed as follows:  Hx of colonic polyps  MVP (mitral valve prolapse)  Multiple thyroid nodules  Hypothyroidism due to acquired atrophy of thyroid  History of gastroesophageal reflux (GERD)  History of IBS  Aortic valve sclerosis  History of hyperlipidemia  Calculus of gallbladder without cholecystitis without obstruction  Orders: No orders of the defined types were placed in this encounter.  No orders of the defined types were placed in this encounter.   Face-to-face time spent with patient was 30 minutes. Greater than 50% of time was spent in counseling and coordination of care.  Follow-Up Instructions: Return in about 2 months (around 11/15/2021) for Osteoarthritis.   Bo Merino, MD  Note - This record has been created using Editor, commissioning.  Chart creation errors have been sought, but may not always  have been located. Such creation errors do not reflect on  the standard of medical care.

## 2021-09-16 ENCOUNTER — Ambulatory Visit (INDEPENDENT_AMBULATORY_CARE_PROVIDER_SITE_OTHER): Payer: Medicare Other | Admitting: *Deleted

## 2021-09-16 DIAGNOSIS — Z23 Encounter for immunization: Secondary | ICD-10-CM | POA: Diagnosis not present

## 2021-09-17 DIAGNOSIS — H524 Presbyopia: Secondary | ICD-10-CM | POA: Diagnosis not present

## 2021-09-17 DIAGNOSIS — H10413 Chronic giant papillary conjunctivitis, bilateral: Secondary | ICD-10-CM | POA: Diagnosis not present

## 2021-09-17 DIAGNOSIS — Z961 Presence of intraocular lens: Secondary | ICD-10-CM | POA: Diagnosis not present

## 2021-09-18 ENCOUNTER — Telehealth: Payer: Self-pay | Admitting: *Deleted

## 2021-09-18 NOTE — Telephone Encounter (Signed)
Prolia insurance verification has been sent awaiting Summary of benefits  

## 2021-09-18 NOTE — Telephone Encounter (Signed)
Prolia given 04/30/2021 Next injection 10/31/2021

## 2021-09-21 ENCOUNTER — Other Ambulatory Visit: Payer: Self-pay | Admitting: Internal Medicine

## 2021-09-21 ENCOUNTER — Ambulatory Visit: Payer: Medicare Other | Admitting: Rheumatology

## 2021-09-21 DIAGNOSIS — I358 Other nonrheumatic aortic valve disorders: Secondary | ICD-10-CM

## 2021-09-21 DIAGNOSIS — M5136 Other intervertebral disc degeneration, lumbar region: Secondary | ICD-10-CM

## 2021-09-21 DIAGNOSIS — I10 Essential (primary) hypertension: Secondary | ICD-10-CM

## 2021-09-21 DIAGNOSIS — Z8639 Personal history of other endocrine, nutritional and metabolic disease: Secondary | ICD-10-CM

## 2021-09-21 DIAGNOSIS — M7061 Trochanteric bursitis, right hip: Secondary | ICD-10-CM

## 2021-09-21 DIAGNOSIS — Z86010 Personal history of colonic polyps: Secondary | ICD-10-CM

## 2021-09-21 DIAGNOSIS — M810 Age-related osteoporosis without current pathological fracture: Secondary | ICD-10-CM

## 2021-09-21 DIAGNOSIS — M5030 Other cervical disc degeneration, unspecified cervical region: Secondary | ICD-10-CM

## 2021-09-21 DIAGNOSIS — I341 Nonrheumatic mitral (valve) prolapse: Secondary | ICD-10-CM

## 2021-09-21 DIAGNOSIS — M532X1 Spinal instabilities, occipito-atlanto-axial region: Secondary | ICD-10-CM

## 2021-09-21 DIAGNOSIS — G8929 Other chronic pain: Secondary | ICD-10-CM

## 2021-09-21 DIAGNOSIS — E042 Nontoxic multinodular goiter: Secondary | ICD-10-CM

## 2021-09-21 DIAGNOSIS — M19041 Primary osteoarthritis, right hand: Secondary | ICD-10-CM

## 2021-09-21 DIAGNOSIS — M19071 Primary osteoarthritis, right ankle and foot: Secondary | ICD-10-CM

## 2021-09-21 DIAGNOSIS — K8020 Calculus of gallbladder without cholecystitis without obstruction: Secondary | ICD-10-CM

## 2021-09-21 DIAGNOSIS — E034 Atrophy of thyroid (acquired): Secondary | ICD-10-CM

## 2021-09-21 DIAGNOSIS — Z8719 Personal history of other diseases of the digestive system: Secondary | ICD-10-CM

## 2021-09-22 NOTE — Telephone Encounter (Signed)
Deductible $233 Met of $233 Required  OOP MAX Individual: No (No) Family: No (No)    Annual exam 08/07/2021  Calcium  9.7           Date 08/13/2021  Upcoming dental procedures no  Prior Authorization needed no  Pt estimated Cost $0   11/01/2021    Coverage Details: This is a Medicare [place] and it covers the Medicare Part B deductible, co-insurance and 100% of the excess charges.

## 2021-09-23 MED ORDER — TRIAMCINOLONE ACETONIDE 40 MG/ML IJ SUSP
40.0000 mg | INTRAMUSCULAR | Status: AC | PRN
  Administered 2021-09-15: 40 mg via INTRA_ARTICULAR

## 2021-09-23 MED ORDER — LIDOCAINE HCL 1 % IJ SOLN
1.5000 mL | INTRAMUSCULAR | Status: AC | PRN
  Administered 2021-09-15: 1.5 mL

## 2021-09-23 NOTE — Telephone Encounter (Signed)
Patient calling in to check status of refill request  Says she is completely out & needs this medication refilled as soon as possible

## 2021-09-30 ENCOUNTER — Other Ambulatory Visit: Payer: Self-pay | Admitting: Internal Medicine

## 2021-10-05 ENCOUNTER — Ambulatory Visit (INDEPENDENT_AMBULATORY_CARE_PROVIDER_SITE_OTHER): Payer: Medicare Other

## 2021-10-05 ENCOUNTER — Other Ambulatory Visit: Payer: Self-pay

## 2021-10-05 VITALS — BP 110/68 | HR 81 | Temp 98.0°F | Ht 60.0 in | Wt 108.0 lb

## 2021-10-05 DIAGNOSIS — Z0000 Encounter for general adult medical examination without abnormal findings: Secondary | ICD-10-CM

## 2021-10-05 NOTE — Patient Instructions (Signed)
[name] , Thank you for taking time to come for your Medicare Wellness Visit. I appreciate your ongoing commitment to your health goals. Please review the following plan we discussed and let me know if I can assist you in the future.   Screening recommendations/referrals: Colonoscopy: no longer required  Mammogram: no longer required  Bone Density: 10/29/2020 Recommended yearly ophthalmology/optometry visit for glaucoma screening and checkup Recommended yearly dental visit for hygiene and checkup  Vaccinations: Influenza vaccine: completed  Pneumococcal vaccine: completed  Tdap vaccine: 06/18/2020 Shingles vaccine: will consider     Advanced directives: will provide copies   Conditions/risks identified: none   Next appointment: none    Preventive Care 83 Years and Older, Female Preventive care refers to lifestyle choices and visits with your health care provider that can promote health and wellness. What does preventive care include? A yearly physical exam. This is also called an annual well check. Dental exams once or twice a year. Routine eye exams. Ask your health care provider how often you should have your eyes checked. Personal lifestyle choices, including: Daily care of your teeth and gums. Regular physical activity. Eating a healthy diet. Avoiding tobacco and drug use. Limiting alcohol use. Practicing safe sex. Taking low-dose aspirin every day. Taking vitamin and mineral supplements as recommended by your health care provider. What happens during an annual well check? The services and screenings done by your health care provider during your annual well check will depend on your age, overall health, lifestyle risk factors, and family history of disease. Counseling  Your health care provider may ask you questions about your: Alcohol use. Tobacco use. Drug use. Emotional well-being. Home and relationship well-being. Sexual activity. Eating habits. History of  falls. Memory and ability to understand (cognition). Work and work Statistician. Reproductive health. Screening  You may have the following tests or measurements: Height, weight, and BMI. Blood pressure. Lipid and cholesterol levels. These may be checked every 5 years, or more frequently if you are over 62 years old. Skin check. Lung cancer screening. You may have this screening every year starting at age 83 if you have a 30-pack-year history of smoking and currently smoke or have quit within the past 15 years. Fecal occult blood test (FOBT) of the stool. You may have this test every year starting at age 84. Flexible sigmoidoscopy or colonoscopy. You may have a sigmoidoscopy every 5 years or a colonoscopy every 10 years starting at age 83. Hepatitis C blood test. Hepatitis B blood test. Sexually transmitted disease (STD) testing. Diabetes screening. This is done by checking your blood sugar (glucose) after you have not eaten for a while (fasting). You may have this done every 1-3 years. Bone density scan. This is done to screen for osteoporosis. You may have this done starting at age 39. Mammogram. This may be done every 1-2 years. Talk to your health care provider about how often you should have regular mammograms. Talk with your health care provider about your test results, treatment options, and if necessary, the need for more tests. Vaccines  Your health care provider may recommend certain vaccines, such as: Influenza vaccine. This is recommended every year. Tetanus, diphtheria, and acellular pertussis (Tdap, Td) vaccine. You may need a Td booster every 10 years. Zoster vaccine. You may need this after age 51. Pneumococcal 13-valent conjugate (PCV13) vaccine. One dose is recommended after age 58. Pneumococcal polysaccharide (PPSV23) vaccine. One dose is recommended after age 59. Talk to your health care provider about which  screenings and vaccines you need and how often you need  them. This information is not intended to replace advice given to you by your health care provider. Make sure you discuss any questions you have with your health care provider. Document Released: 12/12/2015 Document Revised: 08/04/2016 Document Reviewed: 09/16/2015 Elsevier Interactive Patient Education  2017 [place] Prevention in the Home Falls can cause injuries. They can happen to people of all ages. There are many things you can do to make your home safe and to help prevent falls. What can I do on the outside of my home? Regularly fix the edges of walkways and driveways and fix any cracks. Remove anything that might make you trip as you walk through a door, such as a raised step or threshold. Trim any bushes or trees on the path to your home. Use bright outdoor lighting. Clear any walking paths of anything that might make someone trip, such as rocks or tools. Regularly check to see if handrails are loose or broken. Make sure that both sides of any steps have handrails. Any raised decks and porches should have guardrails on the edges. Have any leaves, snow, or ice cleared regularly. Use sand or salt on walking paths during winter. Clean up any spills in your garage right away. This includes oil or grease spills. What can I do in the bathroom? Use night lights. Install grab bars by the toilet and in the tub and shower. Do not use towel bars as grab bars. Use non-skid mats or decals in the tub or shower. If you need to sit down in the shower, use a plastic, non-slip stool. Keep the floor dry. Clean up any water that spills on the floor as soon as it happens. Remove soap buildup in the tub or shower regularly. Attach bath mats securely with double-sided non-slip rug tape. Do not have throw rugs and other things on the floor that can make you trip. What can I do in the bedroom? Use night lights. Make sure that you have a light by your bed that is easy to reach. Do not use  any sheets or blankets that are too big for your bed. They should not hang down onto the floor. Have a firm chair that has side arms. You can use this for support while you get dressed. Do not have throw rugs and other things on the floor that can make you trip. What can I do in the kitchen? Clean up any spills right away. Avoid walking on wet floors. Keep items that you use a lot in easy-to-reach places. If you need to reach something above you, use a strong step stool that has a grab bar. Keep electrical cords out of the way. Do not use floor polish or wax that makes floors slippery. If you must use wax, use non-skid floor wax. Do not have throw rugs and other things on the floor that can make you trip. What can I do with my stairs? Do not leave any items on the stairs. Make sure that there are handrails on both sides of the stairs and use them. Fix handrails that are broken or loose. Make sure that handrails are as long as the stairways. Check any carpeting to make sure that it is firmly attached to the stairs. Fix any carpet that is loose or worn. Avoid having throw rugs at the top or bottom of the stairs. If you do have throw rugs, attach them to the floor with carpet  tape. Make sure that you have a light switch at the top of the stairs and the bottom of the stairs. If you do not have them, ask someone to add them for you. What else can I do to help prevent falls? Wear shoes that: Do not have high heels. Have rubber bottoms. Are comfortable and fit you well. Are closed at the toe. Do not wear sandals. If you use a stepladder: Make sure that it is fully opened. Do not climb a closed stepladder. Make sure that both sides of the stepladder are locked into place. Ask someone to hold it for you, if possible. Clearly mark and make sure that you can see: Any grab bars or handrails. First and last steps. Where the edge of each step is. Use tools that help you move around (mobility aids)  if they are needed. These include: Canes. Walkers. Scooters. Crutches. Turn on the lights when you go into a dark area. Replace any light bulbs as soon as they burn out. Set up your furniture so you have a clear path. Avoid moving your furniture around. If any of your floors are uneven, fix them. If there are any pets around you, be aware of where they are. Review your medicines with your doctor. Some medicines can make you feel dizzy. This can increase your chance of falling. Ask your doctor what other things that you can do to help prevent falls. This information is not intended to replace advice given to you by your health care provider. Make sure you discuss any questions you have with your health care provider. Document Released: 09/11/2009 Document Revised: 04/22/2016 Document Reviewed: 12/20/2014 Elsevier Interactive Patient Education  2017 [employer].

## 2021-10-05 NOTE — Progress Notes (Addendum)
Subjective:   [name] is a 85 y.o. female who presents for Medicare Annual (Subsequent) preventive examination.  Review of Systems           Objective:    Today's Vitals   10/05/21 1527  BP: 110/68  Pulse: 81  Temp: 98 F (36.7 C)  SpO2: 96%  Weight: 108 lb (49 kg)  Height: 5' (1.524 m)   Body mass index is 21.09 kg/m.  Advanced Directives 10/05/2021 05/13/2021 06/18/2020 03/21/2019 02/10/2017  Does Patient Have a Medical Advance Directive? Yes Yes Yes Yes Yes  Type of Paramedic of [place];Living will Healthcare Power of Attorney Living will;Healthcare Power of [place];Living will [place];Living will  Does patient want to make changes to medical advance directive? - No - Patient declined No - Patient declined - -  Copy of [place] in Chart? No - copy requested Yes - validated most recent copy scanned in chart (See row information) No - copy requested No - copy requested No - copy requested    Current Medications (verified) Outpatient Encounter Medications as of 10/05/2021  Medication Sig   acetaminophen (TYLENOL) 500 MG tablet Take 500 mg by mouth 2 (two) times daily as needed for mild pain.   azelastine (ASTELIN) 0.1 % nasal spray Place 1 spray into both nostrils at bedtime.   betamethasone valerate ointment (VALISONE) 0.1 % Apply 1 application topically 2 (two) times daily. You can use prn itch for up to one week as needed. Not meant for daily long term use.   Calcium Carbonate-Vitamin D (CALCIUM-VITAMIN D) 500-200 MG-UNIT per tablet Take 3 tablets by mouth daily.   clobetasol (TEMOVATE) 0.05 % external solution Apply 1 application topically 2 (two) times daily as needed (on scalp).   desonide (DESOWEN) 0.05 % cream Apply 1 application topically as needed.   diclofenac Sodium (VOLTAREN) 1 % GEL APPLY 4 GRAMS 4 TIMES DAILY.   EPINEPHrine (EPIPEN 2-PAK) 0.3 mg/0.3 mL IJ SOAJ  injection Inject 0.3 mLs (0.3 mg total) into the muscle as needed.   estradiol (ESTRACE) 0.1 MG/GM vaginal cream Place 1 gram vaginally 2 x a week at hs   famotidine (PEPCID) 40 MG tablet Take 1 tablet (40 mg total) by mouth at bedtime. NEEDS OFFICE VISIT FOR ADDITIONAL REFILLS   gabapentin (NEURONTIN) 100 MG capsule Take 100 mg by mouth at bedtime.   ipratropium (ATROVENT) 0.06 % nasal spray Place 1 spray into the nose 3 (three) times daily as needed for rhinitis.   levocetirizine (XYZAL) 5 MG tablet Take 5 mg by mouth every evening.   LORazepam (ATIVAN) 1 MG tablet TAKE 2-3 TABLETS AT BEDTIME AND 1-2 TABLETS IN THE DAYTIME AS NEEDED.   montelukast (SINGULAIR) 10 MG tablet Take 1 tablet (10 mg total) by mouth at bedtime.   pantoprazole (PROTONIX) 40 MG tablet TAKE (1) TABLET TWICE A DAY BEFORE MEALS.   simvastatin (ZOCOR) 40 MG tablet TAKE 1 TABLET ONCE DAILY.   SYNTHROID 50 MCG tablet TAKE 1 TABLET ONCE DAILY BEFORE BREAKFAST.   traMADol (ULTRAM) 50 MG tablet Take by mouth every 6 (six) hours as needed.   triamcinolone (NASACORT) 55 MCG/ACT AERO nasal inhaler Place 2 sprays into the nose daily.   triamcinolone cream (KENALOG) 0.1 % Apply 1 application topically 2 (two) times daily. APPLY TOPICALLY TWICE A DAY   VESICARE 10 MG tablet Take 10 mg by mouth daily as needed (bladder).   No facility-administered encounter  medications on file as of 10/05/2021.    Allergies (verified) Bee venom, Clarithromycin, Doxycycline, Neosporin [neomycin-bacitracin zn-polymyx], and Hydrocortisone   History: Past Medical History:  Diagnosis Date   Allergic rhinitis    Asthma    Atrophic vaginitis    Baker's cyst    Left-Dr. Aluisio   Cataract    Dr. Katy Fitch   CHF (congestive heart failure) (HCC)    Colon polyps    Fibroid    Gastritis    chronic   GERD (gastroesophageal reflux disease)    Heart murmur    Hemorrhoids    Hyperlipidemia    Hypothyroidism    IBS (irritable bowel syndrome)    MVP  (mitral valve prolapse)    Antibiotics required for dental procedures   OA (osteoarthritis)    Osteoporosis 06/2018   T score -2.2 stable on Prolia   Overactive bladder    Thyroid disease    Thyroid nodule    Torn meniscus    bilateral   Past Surgical History:  Procedure Laterality Date   CATARACT EXTRACTION, BILATERAL     EYE SURGERY     POSTERIOR CERVICAL FUSION/FORAMINOTOMY N/A 05/15/2021   Procedure: Cervical One Laminectomy with resection of cyst, Fixation from Occiput to Cervical Four;  Surgeon: Erline Levine, MD;  Location: [facility];  Service: Neurosurgery;  Laterality: N/A;   TONSILLECTOMY     Family History  Problem Relation Age of Onset   Heart disease Mother    Hypertension Mother    Osteoporosis Mother    Congestive Heart Failure Mother    Pancreatic cancer Father    Heart attack Brother    Drug abuse Brother        over dose    Healthy Son    Healthy Son    Colon cancer Neg Hx    Colon polyps Neg Hx    Rectal cancer Neg Hx    Stomach cancer Neg Hx    Social History   Socioeconomic History   Marital status: Married    Spouse name: Not on file   Number of children: 2   Years of education: Not on file   Highest education level: Not on file  Occupational History   Occupation: retired    Fish farm manager: RETIRED  Tobacco Use   Smoking status: Former   Smokeless tobacco: Never  Scientific laboratory technician Use: Never used  Substance and Sexual Activity   Alcohol use: Yes    Comment: Socially   Drug use: No   Sexual activity: Yes    Birth control/protection: Post-menopausal, None    Comment: 1st intercourse 85 yo-Fewer than 5 partners  Other Topics Concern   Not on file  Social History Narrative   Regular exercise-yes   Daily caffeine use   Social Determinants of Health   Financial Resource Strain: Low Risk    Difficulty of Paying Living Expenses: Not hard at all  Food Insecurity: No Food Insecurity   Worried About Charity fundraiser in the Last Year: Never  true   [place] in the Last Year: Never true  Transportation Needs: No Transportation Needs   Lack of Transportation (Medical): No   Lack of Transportation (Non-Medical): No  Physical Activity: Sufficiently Active   Days of Exercise per Week: 5 days   Minutes of Exercise per Session: 40 min  Stress: No Stress Concern Present   Feeling of Stress : Not at all  Social Connections: Socially Integrated   Frequency of  Communication with Friends and Family: Twice a week   Frequency of Social Gatherings with Friends and Family: Twice a week   Attends Religious Services: More than 4 times per year   Active Member of [employer] or Organizations: Yes   Attends Music therapist: More than 4 times per year   Marital Status: Married    Tobacco Counseling Counseling given: Not Answered   Clinical Intake:  Pre-visit preparation completed: Yes  Pain : No/denies pain     Nutritional Risks: None Diabetes: No  How often do you need to have someone help you when you read instructions, pamphlets, or other written materials from your doctor or pharmacy?: 1 - Never What is the last grade level you completed in school?: college  Diabetic?no   Interpreter Needed?: No  Information entered by :: [place] of Daily Living In your present state of health, do you have any difficulty performing the following activities: 05/13/2021  Hearing? N  Vision? N  Difficulty concentrating or making decisions? Y  Walking or climbing stairs? N  Dressing or bathing? N  Doing errands, shopping? N  Some recent data might be hidden    Patient Care Team: Plotnikov, Evie Lacks, MD as PCP - General Bensimhon, Shaune Pascal, MD as PCP - Advanced Heart Failure (Cardiology) Cherylann Banas Cherly Anderson, MD (Inactive) (Obstetrics and Gynecology) Kayliana Shipper, MD (Gastroenterology) Sharyne Peach, MD as Consulting Physician (Ophthalmology) Bo Merino, MD as Consulting Physician  (Rheumatology) Erline Levine, MD as Consulting Physician (Neurosurgery)  Indicate any recent Medical Services you may have received from other than [facility] providers in the past year (date may be approximate).     Assessment:   This is a routine wellness examination for [place].  Hearing/Vision screen Vision Screening - Comments:: Annual eye exams wears glasses   Dietary issues and exercise activities discussed:     Goals Addressed             This Visit's Progress    maintain current health status   On track    Increase vegetable and fiber intake.       Depression Screen PHQ 2/9 Scores 10/05/2021 08/19/2021 06/18/2020 03/21/2019 09/20/2018 02/10/2017 07/07/2016  PHQ - 2 Score 0 0 1 0 0 0 0  PHQ- 9 Score - 3 - - - - -    Fall Risk Fall Risk  10/05/2021 08/19/2021 06/18/2020 03/21/2019 06/28/2018  Falls in the past year? 1 1 0 0 No  Comment - - - - [place]i Telephone Survey: data to providers prior to load  Number falls in past yr: 0 1 0 0 -  Injury with Fall? 0 1 0 - -  Risk for fall due to : - History of fall(s);Impaired balance/gait - - -  Follow up Falls evaluation completed - - - -    FALL RISK PREVENTION PERTAINING TO THE HOME:  Any stairs in or around the home? No  If so, are there any without handrails? Yes  Home free of loose throw rugs in walkways, pet beds, electrical cords, etc? Yes  Adequate lighting in your home to reduce risk of falls? Yes   ASSISTIVE DEVICES UTILIZED TO PREVENT FALLS:  Life alert? Yes  Use of a cane, walker or w/c? No  Grab bars in the bathroom? Yes  Shower chair or bench in shower? Yes  Elevated toilet seat or a handicapped toilet? Yes   TIMED UP AND GO:  Was the test performed? Yes .  Length of  time to ambulate 10 feet: 10 sec.   Gait steady and fast without use of assistive device  Cognitive Function:Normal cognitive status assessed by direct observation by this Nurse Health Advisor. No abnormalities found.   MMSE - Mini Mental State  Exam 02/10/2017  Orientation to time 5  Orientation to Place 5  Registration 3  Attention/ Calculation 5  Recall 2  Language- name 2 objects 2  Language- repeat 1  Language- follow 3 step command 3  Language- read & follow direction 1  Write a sentence 1  Copy design 1  Total score 29     6CIT Screen 06/18/2020  What Year? 0 points  What month? 0 points  What time? 0 points  Count back from 20 0 points  Months in reverse 0 points  Repeat phrase 0 points  Total Score 0    Immunizations Immunization History  Administered Date(s) Administered   Fluad Quad(high Dose 65+) 08/04/2019, 09/16/2021   Influenza Whole 09/29/2007, 08/21/2008, 09/02/2010, 07/30/2012   Influenza, High Dose Seasonal PF 09/03/2013, 02/19/2015, 11/12/2015, 08/06/2016, 11/11/2016, 08/20/2017, 08/31/2017, 09/20/2018, 09/12/2019   Influenza,inj,Quad PF,6+ Mos 12/17/2014, 07/30/2015   PFIZER(Purple Top)SARS-COV-2 Vaccination 12/12/2019, 12/31/2019, 07/29/2020, 07/29/2020   Pneumococcal Conjugate-13 01/09/2014   Pneumococcal Polysaccharide-23 09/26/2006, 08/12/2015, 11/11/2016, 08/31/2017, 09/10/2020, 09/10/2021   Td 04/14/2010   Tdap 06/18/2020   Typhoid Inactivated 02/23/2013   Zoster, Live 12/09/2006    TDAP status: Up to date  Flu Vaccine status: Up to date  Pneumococcal vaccine status: Up to date  Covid-19 vaccine status: Completed vaccines  Qualifies for Shingles Vaccine? Yes   Zostavax completed No   Shingrix Completed?: No.    Education has been provided regarding the importance of this vaccine. Patient has been advised to call insurance company to determine out of pocket expense if they have not yet received this vaccine. Advised may also receive vaccine at local pharmacy or Health Dept. Verbalized acceptance and understanding.  Screening Tests Health Maintenance  Topic Date Due   Zoster Vaccines- Shingrix (1 of 2) Never done   COVID-19 Vaccine (4 - Booster for Pfizer series) 09/23/2020    PAP SMEAR-Modifier  08/08/2023   TETANUS/TDAP  06/18/2030   Pneumonia Vaccine 59+ Years old  Completed   INFLUENZA VACCINE  Completed   DEXA SCAN  Completed   HPV VACCINES  Aged Out    Health Maintenance  Health Maintenance Due  Topic Date Due   Zoster Vaccines- Shingrix (1 of 2) Never done   COVID-19 Vaccine (4 - Booster for Pfizer series) 09/23/2020    Colorectal cancer screening: No longer required.   Mammogram status: No longer required due to age.  Bone Density status: Completed 10/29/2020. Results reflect: Bone density results: OSTEOPENIA. Repeat every 5 years.  Lung Cancer Screening: (Low Dose CT Chest recommended if Age 20-80 years, 30 pack-year currently smoking OR have quit w/in 15years.) does not qualify.   Lung Cancer Screening Referral: n/a  Additional Screening:  Hepatitis C Screening: does not qualify;   Vision Screening: Recommended annual ophthalmology exams for early detection of glaucoma and other disorders of the eye. Is the patient up to date with their annual eye exam?  Yes  Who is the provider or what is the name of the office in which the patient attends annual eye exams? Dr.Gould  If pt is not established with a provider, would they like to be referred to a provider to establish care? Yes .   Dental Screening: Recommended annual dental exams for proper oral  hygiene  Community Resource Referral / Chronic Care Management: CRR required this visit?  No   CCM required this visit?  No      Plan:     I have personally reviewed and noted the following in the patient's chart:   Medical and social history Use of alcohol, tobacco or illicit drugs  Current medications and supplements including opioid prescriptions.  Functional ability and status Nutritional status Physical activity Advanced directives List of other physicians Hospitalizations, surgeries, and ER visits in previous 12 months Vitals Screenings to include cognitive, depression, and  falls Referrals and appointments  In addition, I have reviewed and discussed with patient certain preventive protocols, quality metrics, and best practice recommendations. A written personalized care plan for preventive services as well as general preventive health recommendations were provided to patient.     Randel Pigg, LPN   67/12/917   Nurse Notes: none   Medical screening examination/treatment/procedure(s) were performed by non-physician practitioner and as supervising physician I was immediately available for consultation/collaboration.  I agree with above. Lew Dawes, MD

## 2021-10-12 DIAGNOSIS — R030 Elevated blood-pressure reading, without diagnosis of hypertension: Secondary | ICD-10-CM | POA: Diagnosis not present

## 2021-10-13 DIAGNOSIS — H00012 Hordeolum externum right lower eyelid: Secondary | ICD-10-CM | POA: Diagnosis not present

## 2021-10-13 DIAGNOSIS — H10413 Chronic giant papillary conjunctivitis, bilateral: Secondary | ICD-10-CM | POA: Diagnosis not present

## 2021-10-13 NOTE — Progress Notes (Deleted)
Office Visit Note  Patient: [name]             Date of Birth: [birth year]           MRN: [id number]             PCP: Cassandria Anger, MD Referring: Cassandria Anger, MD Visit Date: 10/27/2021 Occupation: @GUAROCC @  Subjective:  No chief complaint on file.   History of Present Illness: [name] is a 85 y.o. female ***   Activities of Daily Living:  Patient reports morning stiffness for *** {minute/hour:19697}.   Patient {ACTIONS;DENIES/REPORTS:21021675::"Denies"} nocturnal pain.  Difficulty dressing/grooming: {ACTIONS;DENIES/REPORTS:21021675::"Denies"} Difficulty climbing stairs: {ACTIONS;DENIES/REPORTS:21021675::"Denies"} Difficulty getting out of chair: {ACTIONS;DENIES/REPORTS:21021675::"Denies"} Difficulty using hands for taps, buttons, cutlery, and/or writing: {ACTIONS;DENIES/REPORTS:21021675::"Denies"}  No Rheumatology ROS completed.   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 05/13/2021   Cervical spinal cord compression ([place]) 05/04/2021   Fall against object 03/25/2021   Hematoma of face, initial encounter 03/25/2021   Degenerative disc disease, cervical 03/11/2021   Spondylolisthesis, cervical region 03/11/2021   Trochanteric bursitis of right hip 09/22/2020   Rheumatoid arthritis involving multiple sites ([place]) 08/20/2020   Elevated blood-pressure reading, without diagnosis of hypertension 07/14/2020   Meningioma ([place]) 03/25/2020   Arthralgia 03/18/2020   RUQ pain 01/02/2020   Cholelithiasis 09/09/2019   Ear  pain, bilateral 03/21/2019   Cervical spondylosis 01/05/2019   Neck pain 12/26/2018   Acute pain of left shoulder 12/26/2018   Anxiety 08/22/2018   Contact dermatitis and eczema 06/14/2018   Constipation 11/11/2016   Low back pain 11/10/2016   Leg abrasion 08/12/2016   Contusion of right knee 08/12/2016   Sinusitis, chronic 08/06/2016   Rash and nonspecific skin eruption 04/20/2016   Aortic valve sclerosis 03/12/2015   Contusion of left chest wall 01/09/2015   Fatigue 09/08/2014   Bladder pain 08/19/2014   Increased frequency of urination 08/19/2014   Urinary urgency 08/19/2014   Postherpetic neuralgia 10/02/2013   Impacted cerumen of right ear 07/16/2013   Headache 07/10/2013   Aortic stenosis 04/19/2013   Insomnia 01/24/2013   Cough 08/07/2012   URI (upper respiratory infection) 08/03/2012   Pruritus of skin 03/11/2012   MVP (mitral valve prolapse)    Multiple thyroid nodules 10/04/2011   Herpes zoster 09/07/2011   Essential hypertension, benign 07/27/2011   Atrophic vaginitis    Well adult exam 03/15/2011   Hyperlipemia, mixed 03/15/2011   OVERACTIVE BLADDER 09/02/2010   Hypothyroidism 03/03/2010   CALF PAIN, LEFT 10/27/2009   Belching 07/21/2009   EAR PAIN 12/19/2008   Allergic rhinitis 12/19/2008   Irritable bowel syndrome 09/04/2008   PERSONAL HX COLONIC POLYPS 09/04/2008   GASTRITIS, CHRONIC 09/03/2008   DUODENITIS, WITHOUT HEMORRHAGE 09/03/2008   TIBIALIS TENDINITIS 03/20/2008   OSTEOARTHRITIS 02/25/2008   SWEATING 02/21/2008   LACTOSE INTOLERANCE 11/20/2007   Dyslipidemia 11/20/2007   GERD 11/20/2007   Osteoporosis 11/20/2007    Past Medical History:  Diagnosis Date   Allergic rhinitis    Asthma    Atrophic vaginitis    Baker's cyst  Left-Dr. Aluisio   Cataract    Dr. Katy Fitch   CHF (congestive heart failure) (HCC)    Colon polyps    Fibroid    Gastritis    chronic   GERD (gastroesophageal reflux disease)    Heart murmur    Hemorrhoids     Hyperlipidemia    Hypothyroidism    IBS (irritable bowel syndrome)    MVP (mitral valve prolapse)    Antibiotics required for dental procedures   OA (osteoarthritis)    Osteoporosis 06/2018   T score -2.2 stable on Prolia   Overactive bladder    Thyroid disease    Thyroid nodule    Torn meniscus    bilateral    Family History  Problem Relation Age of Onset   Heart disease Mother    Hypertension Mother    Osteoporosis Mother    Congestive Heart Failure Mother    Pancreatic cancer Father    Heart attack Brother    Drug abuse Brother        over dose    Healthy Son    Healthy Son    Colon cancer Neg Hx    Colon polyps Neg Hx    Rectal cancer Neg Hx    Stomach cancer Neg Hx    Past Surgical History:  Procedure Laterality Date   CATARACT EXTRACTION, BILATERAL     EYE SURGERY     POSTERIOR CERVICAL FUSION/FORAMINOTOMY N/A 05/15/2021   Procedure: Cervical One Laminectomy with resection of cyst, Fixation from Occiput to Cervical Four;  Surgeon: Erline Levine, MD;  Location: [facility];  Service: Neurosurgery;  Laterality: N/A;   TONSILLECTOMY     Social History   Social History Narrative   Regular exercise-yes   Daily caffeine use   Immunization History  Administered Date(s) Administered   Fluad Quad(high Dose 65+) 08/04/2019, 09/16/2021   Influenza Whole 09/29/2007, 08/21/2008, 09/02/2010, 07/30/2012   Influenza, High Dose Seasonal PF 09/03/2013, 02/19/2015, 11/12/2015, 08/06/2016, 11/11/2016, 08/20/2017, 08/31/2017, 09/20/2018, 09/12/2019   Influenza,inj,Quad PF,6+ Mos 12/17/2014, 07/30/2015   PFIZER(Purple Top)SARS-COV-2 Vaccination 12/12/2019, 12/31/2019, 07/29/2020, 07/29/2020   Pneumococcal Conjugate-13 01/09/2014   Pneumococcal Polysaccharide-23 09/26/2006, 08/12/2015, 11/11/2016, 08/31/2017, 09/10/2020, 09/10/2021   Td 04/14/2010   Tdap 06/18/2020   Typhoid Inactivated 02/23/2013   Zoster, Live 12/09/2006     Objective: Vital Signs: There were no vitals taken  for this visit.   Physical Exam   Musculoskeletal Exam: ***  CDAI Exam: CDAI Score: -- Patient Global: --; Provider Global: -- Swollen: --; Tender: -- Joint Exam 10/27/2021   No joint exam has been documented for this visit   There is currently no information documented on the homunculus. Go to the Rheumatology activity and complete the homunculus joint exam.  Investigation: No additional findings.  Imaging: No results found.  Recent Labs: Lab Results  Component Value Date   WBC 6.5 08/13/2021   HGB 13.6 08/13/2021   PLT 226.0 R 08/13/2021   NA 138 08/13/2021   K 4.2 08/13/2021   CL 101 08/13/2021   CO2 27 08/13/2021   GLUCOSE 91 08/13/2021   BUN 12 08/13/2021   CREATININE 0.71 08/13/2021   BILITOT 0.6 08/13/2021   ALKPHOS 41 08/13/2021   AST 21 08/13/2021   ALT 17 08/13/2021   PROT 7.5 08/13/2021   ALBUMIN 4.3 08/13/2021   CALCIUM 9.7 08/13/2021   GFRAA 87 07/17/2020    Speciality Comments: No specialty comments available.  Procedures:  No procedures performed Allergies: Bee venom, Clarithromycin, Doxycycline, Neosporin [neomycin-bacitracin  zn-polymyx], and Hydrocortisone   Assessment / Plan:     Visit Diagnoses: No diagnosis found.  Orders: No orders of the defined types were placed in this encounter.  No orders of the defined types were placed in this encounter.   Face-to-face time spent with patient was *** minutes. Greater than 50% of time was spent in counseling and coordination of care.  Follow-Up Instructions: No follow-ups on file.   Earnestine Mealing, CMA  Note - This record has been created using Editor, commissioning.  Chart creation errors have been sought, but may not always  have been located. Such creation errors do not reflect on  the standard of medical care.

## 2021-10-16 ENCOUNTER — Other Ambulatory Visit: Payer: Self-pay | Admitting: Internal Medicine

## 2021-10-16 ENCOUNTER — Telehealth: Payer: Self-pay | Admitting: Internal Medicine

## 2021-10-16 DIAGNOSIS — Z1231 Encounter for screening mammogram for malignant neoplasm of breast: Secondary | ICD-10-CM

## 2021-10-16 DIAGNOSIS — F419 Anxiety disorder, unspecified: Secondary | ICD-10-CM

## 2021-10-16 DIAGNOSIS — E785 Hyperlipidemia, unspecified: Secondary | ICD-10-CM

## 2021-10-16 NOTE — Telephone Encounter (Signed)
Pt has lab appt 12/19 for PCP visit 12/21. Please enter lab orders.

## 2021-10-16 NOTE — Telephone Encounter (Signed)
MD is out of the office until dec 1st. Will hold for him to order. No labs in comp.Marland KitchenJohny Chess

## 2021-10-21 DIAGNOSIS — R102 Pelvic and perineal pain: Secondary | ICD-10-CM | POA: Diagnosis not present

## 2021-10-21 DIAGNOSIS — Z743 Need for continuous supervision: Secondary | ICD-10-CM | POA: Diagnosis not present

## 2021-10-21 DIAGNOSIS — S069X9A Unspecified intracranial injury with loss of consciousness of unspecified duration, initial encounter: Secondary | ICD-10-CM | POA: Diagnosis not present

## 2021-10-21 DIAGNOSIS — W19XXXA Unspecified fall, initial encounter: Secondary | ICD-10-CM | POA: Diagnosis not present

## 2021-10-21 DIAGNOSIS — S3993XA Unspecified injury of pelvis, initial encounter: Secondary | ICD-10-CM | POA: Diagnosis not present

## 2021-10-21 DIAGNOSIS — T1490XA Injury, unspecified, initial encounter: Secondary | ICD-10-CM | POA: Diagnosis not present

## 2021-10-21 DIAGNOSIS — S0990XA Unspecified injury of head, initial encounter: Secondary | ICD-10-CM | POA: Diagnosis not present

## 2021-10-21 DIAGNOSIS — S0181XA Laceration without foreign body of other part of head, initial encounter: Secondary | ICD-10-CM | POA: Diagnosis not present

## 2021-10-21 DIAGNOSIS — S199XXA Unspecified injury of neck, initial encounter: Secondary | ICD-10-CM | POA: Diagnosis not present

## 2021-10-21 DIAGNOSIS — S299XXA Unspecified injury of thorax, initial encounter: Secondary | ICD-10-CM | POA: Diagnosis not present

## 2021-10-22 NOTE — Telephone Encounter (Signed)
Ok CMET, TSH Thx

## 2021-10-26 ENCOUNTER — Emergency Department (HOSPITAL_BASED_OUTPATIENT_CLINIC_OR_DEPARTMENT_OTHER): Payer: Medicare Other

## 2021-10-26 ENCOUNTER — Emergency Department (HOSPITAL_BASED_OUTPATIENT_CLINIC_OR_DEPARTMENT_OTHER)
Admission: EM | Admit: 2021-10-26 | Discharge: 2021-10-26 | Disposition: A | Discharge status: Home / Self Care | Payer: Medicare Other | Attending: Emergency Medicine | Admitting: Emergency Medicine

## 2021-10-26 ENCOUNTER — Other Ambulatory Visit: Payer: Self-pay

## 2021-10-26 DIAGNOSIS — Z79899 Other long term (current) drug therapy: Secondary | ICD-10-CM | POA: Insufficient documentation

## 2021-10-26 DIAGNOSIS — R519 Headache, unspecified: Secondary | ICD-10-CM | POA: Insufficient documentation

## 2021-10-26 DIAGNOSIS — Z87891 Personal history of nicotine dependence: Secondary | ICD-10-CM | POA: Diagnosis not present

## 2021-10-26 DIAGNOSIS — E039 Hypothyroidism, unspecified: Secondary | ICD-10-CM | POA: Diagnosis not present

## 2021-10-26 DIAGNOSIS — J4520 Mild intermittent asthma, uncomplicated: Secondary | ICD-10-CM | POA: Diagnosis not present

## 2021-10-26 DIAGNOSIS — I110 Hypertensive heart disease with heart failure: Secondary | ICD-10-CM | POA: Diagnosis not present

## 2021-10-26 DIAGNOSIS — I509 Heart failure, unspecified: Secondary | ICD-10-CM | POA: Diagnosis not present

## 2021-10-26 MED ORDER — ONDANSETRON HCL 4 MG PO TABS: 4.0000 mg | ORAL_TABLET | Freq: Four times a day (QID) | ORAL | 0 refills | Status: DC

## 2021-10-26 MED ORDER — ONDANSETRON 4 MG PO TBDP
4.0000 mg | ORAL_TABLET | Freq: Once | ORAL | Status: AC
  Administered 2021-10-26: 13:00:00 4 mg via ORAL
  Filled 2021-10-26: qty 1

## 2021-10-26 NOTE — ED Notes (Signed)
Patient transported to CT 

## 2021-10-26 NOTE — ED Provider Notes (Signed)
[facility] EMERGENCY DEPT Provider Note   CSN: [id number] Arrival date & time: 10/26/21  1038     History Chief Complaint  Patient presents with   fall follow up    [name] is a 85 y.o. female.  The history is provided by the patient.  Headache Pain location:  Generalized Quality:  Dull Radiates to:  Does not radiate Onset quality:  Gradual Duration:  3 days Timing:  Intermittent Progression:  Waxing and waning Context comment:  Headache after hitting her head several days ago.  Went to emergency department at that time and had negative head, neck CTs Relieved by:  Nothing Worsened by:  Nothing Associated symptoms: no abdominal pain, no back pain, no blurred vision, no congestion, no cough, no diarrhea, no dizziness, no drainage, no ear pain, no eye pain, no facial pain, no fatigue, no fever, no focal weakness, no hearing loss, no loss of balance, no myalgias, no nausea, no near-syncope, no neck pain, no neck stiffness, no numbness, no paresthesias, no photophobia, no seizures, no sinus pressure, no sore throat, no swollen glands, no syncope, no tingling, no URI, no visual change, no vomiting and no weakness       Past Medical History:  Diagnosis Date   Allergic rhinitis    Asthma    Atrophic vaginitis    Baker's cyst    Left-Dr. Aluisio   Cataract    Dr. Katy Fitch   CHF (congestive heart failure) (HCC)    Colon polyps    Fibroid    Gastritis    chronic   GERD (gastroesophageal reflux disease)    Heart murmur    Hemorrhoids    Hyperlipidemia    Hypothyroidism    IBS (irritable bowel syndrome)    MVP (mitral valve prolapse)    Antibiotics required for dental procedures   OA (osteoarthritis)    Osteoporosis 06/2018   T score -2.2 stable on Prolia   Overactive bladder    Thyroid disease    Thyroid nodule    Torn meniscus    bilateral    Patient Active Problem List   Diagnosis Date Noted   Scalp itch 08/19/2021   Allergic rhinitis due to  animal (cat) (dog) hair and dander 08/07/2021   Allergic rhinitis due to pollen 08/07/2021   Mild intermittent asthma 08/07/2021   Closed fracture of first cervical vertebra ([place]) 08/07/2021   Closed fracture of second cervical vertebra ([place]) 08/07/2021   Cervical spine instability 05/15/2021   Closed 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 05/13/2021   Cervical spinal cord compression ([place]) 05/04/2021   Fall against object 03/25/2021   Hematoma of face, initial encounter 03/25/2021   Degenerative disc disease, cervical 03/11/2021   Spondylolisthesis, cervical region 03/11/2021   Trochanteric bursitis of right hip 09/22/2020   Rheumatoid arthritis involving multiple sites ([place]) 08/20/2020   Elevated blood-pressure reading, without diagnosis of hypertension 07/14/2020   Meningioma ([place]) 03/25/2020   Arthralgia 03/18/2020   RUQ pain 01/02/2020   Cholelithiasis 09/09/2019   Ear pain, bilateral 03/21/2019   Cervical spondylosis 01/05/2019   Neck pain 12/26/2018   Acute pain of left shoulder 12/26/2018   Anxiety 08/22/2018   Contact dermatitis and eczema 06/14/2018   Constipation 11/11/2016   Low back pain 11/10/2016   Leg abrasion 08/12/2016   Contusion of right knee 08/12/2016   Sinusitis, chronic 08/06/2016   Rash and nonspecific skin eruption 04/20/2016   Aortic valve sclerosis 03/12/2015   Contusion of left chest wall  01/09/2015   Fatigue 09/08/2014   Bladder pain 08/19/2014   Increased frequency of urination 08/19/2014   Urinary urgency 08/19/2014   Postherpetic neuralgia 10/02/2013   Impacted cerumen of right ear 07/16/2013   Headache 07/10/2013   Aortic stenosis 04/19/2013   Insomnia 01/24/2013   Cough 08/07/2012   URI (upper respiratory infection) 08/03/2012   Pruritus of skin 03/11/2012   MVP (mitral valve prolapse)    Multiple thyroid nodules 10/04/2011   Herpes zoster 09/07/2011   Essential hypertension, benign 07/27/2011   Atrophic  vaginitis    Well adult exam 03/15/2011   Hyperlipemia, mixed 03/15/2011   OVERACTIVE BLADDER 09/02/2010   Hypothyroidism 03/03/2010   CALF PAIN, LEFT 10/27/2009   Belching 07/21/2009   EAR PAIN 12/19/2008   Allergic rhinitis 12/19/2008   Irritable bowel syndrome 09/04/2008   PERSONAL HX COLONIC POLYPS 09/04/2008   GASTRITIS, CHRONIC 09/03/2008   DUODENITIS, WITHOUT HEMORRHAGE 09/03/2008   TIBIALIS TENDINITIS 03/20/2008   OSTEOARTHRITIS 02/25/2008   SWEATING 02/21/2008   LACTOSE INTOLERANCE 11/20/2007   Dyslipidemia 11/20/2007   GERD 11/20/2007   Osteoporosis 11/20/2007    Past Surgical History:  Procedure Laterality Date   CATARACT EXTRACTION, BILATERAL     EYE SURGERY     POSTERIOR CERVICAL FUSION/FORAMINOTOMY N/A 05/15/2021   Procedure: Cervical One Laminectomy with resection of cyst, Fixation from Occiput to Cervical Four;  Surgeon: Erline Levine, MD;  Location: [facility];  Service: Neurosurgery;  Laterality: N/A;   TONSILLECTOMY       OB History     Gravida  3   Para  2   Term  2   Preterm      AB  1   Living  2      SAB  1   IAB      Ectopic      Multiple      Live Births              Family History  Problem Relation Age of Onset   Heart disease Mother    Hypertension Mother    Osteoporosis Mother    Congestive Heart Failure Mother    Pancreatic cancer Father    Heart attack Brother    Drug abuse Brother        over dose    Healthy Son    Healthy Son    Colon cancer Neg Hx    Colon polyps Neg Hx    Rectal cancer Neg Hx    Stomach cancer Neg Hx     Social History   Tobacco Use   Smoking status: Former   Smokeless tobacco: Never  Scientific laboratory technician Use: Never used  Substance Use Topics   Alcohol use: Yes    Comment: Socially   Drug use: No    Home Medications Prior to Admission medications   Medication Sig Start Date End Date Taking? Authorizing Provider  ondansetron (ZOFRAN) 4 MG tablet Take 1 tablet (4 mg total) by  mouth every 6 (six) hours. 10/26/21  Yes Airis Barbee, DO  acetaminophen (TYLENOL) 500 MG tablet Take 500 mg by mouth 2 (two) times daily as needed for mild pain.    [provider]  azelastine (ASTELIN) 0.1 % nasal spray Place 1 spray into both nostrils at bedtime.    [provider]  betamethasone valerate ointment (VALISONE) 0.1 % Apply 1 application topically 2 (two) times daily. You can use prn itch for up to one week as needed. Not  meant for daily long term use. 08/07/21   Salvadore Dom, MD  Calcium Carbonate-Vitamin D (CALCIUM-VITAMIN D) 500-200 MG-UNIT per tablet Take 3 tablets by mouth daily.    [provider]  clobetasol (TEMOVATE) 0.05 % external solution Apply 1 application topically 2 (two) times daily as needed (on scalp). 08/19/21   Plotnikov, Evie Lacks, MD  desonide (DESOWEN) 0.05 % cream Apply 1 application topically as needed.    [provider]  diclofenac Sodium (VOLTAREN) 1 % GEL APPLY 4 GRAMS 4 TIMES DAILY. 05/18/21   Plotnikov, Evie Lacks, MD  EPINEPHrine (EPIPEN 2-PAK) 0.3 mg/0.3 mL IJ SOAJ injection Inject 0.3 mLs (0.3 mg total) into the muscle as needed. 12/20/18   Plotnikov, Evie Lacks, MD  estradiol (ESTRACE) 0.1 MG/GM vaginal cream Place 1 gram vaginally 2 x a week at hs 08/07/21   Salvadore Dom, MD  famotidine (PEPCID) 40 MG tablet Take 1 tablet (40 mg total) by mouth at bedtime. NEEDS OFFICE VISIT FOR ADDITIONAL REFILLS 08/03/21   Zehr, Janett Billow D, PA-C  gabapentin (NEURONTIN) 100 MG capsule Take 100 mg by mouth at bedtime. 08/05/21   [provider]  ipratropium (ATROVENT) 0.06 % nasal spray Place 1 spray into the nose 3 (three) times daily as needed for rhinitis. 06/27/14   Plotnikov, Evie Lacks, MD  levocetirizine (XYZAL) 5 MG tablet Take 5 mg by mouth every evening.    [provider]  LORazepam (ATIVAN) 1 MG tablet TAKE 2-3 TABLETS AT BEDTIME AND 1-2 TABLETS IN THE DAYTIME AS NEEDED. 09/23/21   Plotnikov, Evie Lacks, MD  montelukast (SINGULAIR) 10 MG tablet Take 1 tablet (10 mg total) by mouth at bedtime. 03/30/21   Plotnikov, Evie Lacks, MD  pantoprazole (PROTONIX) 40 MG tablet TAKE (1) TABLET TWICE A DAY BEFORE MEALS. 02/16/21   Plotnikov, Evie Lacks, MD  simvastatin (ZOCOR) 40 MG tablet TAKE 1 TABLET ONCE DAILY. 10/01/21   Plotnikov, Evie Lacks, MD  SYNTHROID 50 MCG tablet TAKE 1 TABLET ONCE DAILY BEFORE BREAKFAST. 03/23/21   Plotnikov, Evie Lacks, MD  traMADol (ULTRAM) 50 MG tablet Take by mouth every 6 (six) hours as needed.    [provider]  triamcinolone (NASACORT) 55 MCG/ACT AERO nasal inhaler Place 2 sprays into the nose daily.    [provider]  triamcinolone cream (KENALOG) 0.1 % Apply 1 application topically 2 (two) times daily. APPLY TOPICALLY TWICE A DAY 08/12/21   [provider]  VESICARE 10 MG tablet Take 10 mg by mouth daily as needed (bladder). 07/26/15   [provider]    Allergies    Bee venom, Clarithromycin, Doxycycline, Neosporin [neomycin-bacitracin zn-polymyx], and Hydrocortisone  Review of Systems   Review of Systems  Constitutional:  Negative for chills, fatigue and fever.  HENT:  Negative for congestion, ear pain, hearing loss, postnasal drip, sinus pressure and sore throat.   Eyes:  Negative for blurred vision, photophobia, pain and visual disturbance.  Respiratory:  Negative for cough and shortness of breath.   Cardiovascular:  Negative for chest pain, palpitations, syncope and near-syncope.  Gastrointestinal:  Negative for abdominal pain, diarrhea, nausea and vomiting.  Genitourinary:  Negative for dysuria and hematuria.  Musculoskeletal:  Negative for arthralgias, back pain, myalgias, neck pain and neck stiffness.  Skin:  Negative for color change and rash.  Neurological:  Positive for headaches. Negative for dizziness, focal weakness, seizures, syncope, weakness, numbness, paresthesias and loss of balance.  All other systems reviewed and  are negative.  Physical  Exam Updated Vital Signs BP (!) 166/71   Pulse 72   Resp 16   SpO2 98%   Physical Exam Vitals and nursing note reviewed.  Constitutional:      General: She is not in acute distress.    Appearance: She is well-developed. She is not ill-appearing.  HENT:     Head: Normocephalic and atraumatic.     Nose: Nose normal.  Eyes:     Extraocular Movements: Extraocular movements intact.     Conjunctiva/sclera: Conjunctivae normal.     Pupils: Pupils are equal, round, and reactive to light.  Cardiovascular:     Rate and Rhythm: Normal rate and regular rhythm.     Pulses: Normal pulses.     Heart sounds: Normal heart sounds. No murmur heard. Pulmonary:     Effort: Pulmonary effort is normal. No respiratory distress.     Breath sounds: Normal breath sounds.  Abdominal:     Palpations: Abdomen is soft.     Tenderness: There is no abdominal tenderness.  Musculoskeletal:        General: No swelling or tenderness.     Cervical back: Normal range of motion and neck supple.     Comments: No midline spinal tenderness  Skin:    General: Skin is warm and dry.     Capillary Refill: Capillary refill takes less than 2 seconds.  Neurological:     General: No focal deficit present.     Mental Status: She is alert and oriented to person, place, and time.     Cranial Nerves: No cranial nerve deficit.     Sensory: No sensory deficit.     Motor: No weakness.     Coordination: Coordination normal.     Comments: 5+ out of 5 strength throughout, normal sensation, no drift, normal finger-nose-finger, normal speech  Psychiatric:        Mood and Affect: Mood normal.    ED Results / Procedures / Treatments   Labs (all labs ordered are listed, but only abnormal results are displayed) Labs Reviewed - No data to display  EKG None  Radiology CT Head Wo Contrast  Result Date: 10/26/2021 CLINICAL DATA:  Headache.  Nausea. EXAM: CT HEAD WITHOUT CONTRAST TECHNIQUE: Contiguous  axial images were obtained from the base of the skull through the vertex without intravenous contrast. COMPARISON:  03/25/2021 in the brain MR of 04/25/2021. FINDINGS: Brain: A left frontal meningioma of 1.6 cm without significant mass effect or surrounding edema is similar including on 22/2. Otherwise, no mass lesion, hemorrhage, hydrocephalus, acute infarct, intra-axial, or extra-axial fluid collection. Vascular: No hyperdense vessel or unexpected calcification. Skull: No significant soft tissue swelling. Interval craniotomy at the posterior skull base. Sinuses/Orbits: Normal imaged portions of the orbits and globes. Hypoplastic frontal sinuses. Clear paranasal sinuses and mastoid air cells. Other: None. IMPRESSION: 1. No acute intracranial abnormality. 2. Interval posterior skull base craniotomy. 3. Similar left frontal meningioma. Electronically Signed   By: [name] M.D.   On: 10/26/2021 13:07    Procedures Procedures   Medications Ordered in ED Medications  ondansetron (ZOFRAN-ODT) disintegrating tablet 4 mg (4 mg Oral Given 10/26/21 1301)    ED Course  I have reviewed the triage vital signs and the nursing notes.  Pertinent labs & imaging results that were available during my care of the patient were reviewed by me and considered in my medical decision making (see chart for details).    MDM Rules/Calculators/A&P  Anagabriela Jokerst is an 85 year old female with history of reflux, IBS, heart failure, status post cervical surgery has presented to the ED with headache.  Had a fall several days ago and has had a headache since.  At the time of the fall she did have head and neck CT that were unremarkable.  She has a known meningioma.  She continues to have some daily headaches but no weakness or numbness.  She is on blood thinner.  Shared decision was made to get a repeat head CT as headache is persisting and may be slightly worse.  This was unremarkable.  Overall suspect  that she likely has concussion.  Recommend Zofran, Tylenol as needed.  We will have her follow-up with her primary care doctor.  She has no midline spinal pain.  Surgical hardware was intact on CT scan that was done.  I was able to read the reports of the CT scans.  She is not having any numbness or tingling in her upper extremities.  Given reassurance and discharged in the ED in good condition.  She understands return precautions.  She had staples placed at that time but they will need to come out in 7 days.  This chart was dictated using voice recognition software.  Despite best efforts to proofread,  errors can occur which can change the documentation meaning.  Final Clinical Impression(s) / ED Diagnoses Final diagnoses:  Nonintractable headache, unspecified chronicity pattern, unspecified headache type    Rx / DC Orders ED Discharge Orders          Ordered    ondansetron (ZOFRAN) 4 MG tablet  Every 6 hours        10/26/21 Sun, [place], DO 10/26/21 1334

## 2021-10-26 NOTE — Telephone Encounter (Signed)
MD as entered labs.Marland KitchenJohny [name]

## 2021-10-26 NOTE — ED Triage Notes (Signed)
Pt via pov from home (independent living at [facility]) after a fall in [place] last week. Pt states she had a ct scan at the hospital there and had staples in her head. The ct scan showed that her recent surgery on her neck was intact and that she should follow up with her surgeon for further problems. Pt states she began to have headache and nausea on Saturday and tried to call surgeon's office this morning but has not received a call back. Pt alert & oriented, nad noted.

## 2021-10-26 NOTE — ED Notes (Signed)
Pt d/c home per MD order. Discharge summary reviewed with pt, pt verbalizes understanding. Ambulatory off unit. No s/s of acute distress noted.

## 2021-10-27 ENCOUNTER — Ambulatory Visit: Payer: Medicare Other | Admitting: Rheumatology

## 2021-10-27 DIAGNOSIS — E042 Nontoxic multinodular goiter: Secondary | ICD-10-CM

## 2021-10-27 DIAGNOSIS — M810 Age-related osteoporosis without current pathological fracture: Secondary | ICD-10-CM

## 2021-10-27 DIAGNOSIS — I10 Essential (primary) hypertension: Secondary | ICD-10-CM

## 2021-10-27 DIAGNOSIS — M5030 Other cervical disc degeneration, unspecified cervical region: Secondary | ICD-10-CM

## 2021-10-27 DIAGNOSIS — M19041 Primary osteoarthritis, right hand: Secondary | ICD-10-CM

## 2021-10-27 DIAGNOSIS — K8020 Calculus of gallbladder without cholecystitis without obstruction: Secondary | ICD-10-CM

## 2021-10-27 DIAGNOSIS — G8929 Other chronic pain: Secondary | ICD-10-CM

## 2021-10-27 DIAGNOSIS — I341 Nonrheumatic mitral (valve) prolapse: Secondary | ICD-10-CM

## 2021-10-27 DIAGNOSIS — M19071 Primary osteoarthritis, right ankle and foot: Secondary | ICD-10-CM

## 2021-10-27 DIAGNOSIS — M5136 Other intervertebral disc degeneration, lumbar region: Secondary | ICD-10-CM

## 2021-10-27 DIAGNOSIS — Z86010 Personal history of colonic polyps: Secondary | ICD-10-CM

## 2021-10-27 DIAGNOSIS — I358 Other nonrheumatic aortic valve disorders: Secondary | ICD-10-CM

## 2021-10-27 DIAGNOSIS — E034 Atrophy of thyroid (acquired): Secondary | ICD-10-CM

## 2021-10-27 DIAGNOSIS — Z8719 Personal history of other diseases of the digestive system: Secondary | ICD-10-CM

## 2021-10-27 DIAGNOSIS — M7061 Trochanteric bursitis, right hip: Secondary | ICD-10-CM

## 2021-10-27 DIAGNOSIS — Z8639 Personal history of other endocrine, nutritional and metabolic disease: Secondary | ICD-10-CM

## 2021-10-29 ENCOUNTER — Encounter: Payer: Self-pay | Admitting: Internal Medicine

## 2021-10-29 ENCOUNTER — Other Ambulatory Visit: Payer: Self-pay

## 2021-10-29 ENCOUNTER — Telehealth: Payer: Self-pay | Admitting: *Deleted

## 2021-10-29 ENCOUNTER — Ambulatory Visit (INDEPENDENT_AMBULATORY_CARE_PROVIDER_SITE_OTHER): Payer: Medicare Other | Admitting: Internal Medicine

## 2021-10-29 DIAGNOSIS — S0101XS Laceration without foreign body of scalp, sequela: Secondary | ICD-10-CM | POA: Diagnosis not present

## 2021-10-29 DIAGNOSIS — S060X1A Concussion with loss of consciousness of 30 minutes or less, initial encounter: Secondary | ICD-10-CM

## 2021-10-29 DIAGNOSIS — S060XAA Concussion with loss of consciousness status unknown, initial encounter: Secondary | ICD-10-CM | POA: Insufficient documentation

## 2021-10-29 HISTORY — DX: Concussion with loss of consciousness status unknown, initial encounter: S06.0XAA

## 2021-10-29 NOTE — Telephone Encounter (Signed)
Notified find pt.. made appt for 4:20.Marland KitchenJohny [name]

## 2021-10-29 NOTE — Telephone Encounter (Signed)
-----   Message from Cassandria Anger, MD sent at 10/29/2021  7:11 AM EST ----- Regarding: add on Kailly Richoux, Please add her to my schedule for 4:20 p.m. today and call her.  Diagnosis-concussion. Thank you  PS: Happy birthday!!!!

## 2021-10-29 NOTE — Progress Notes (Signed)
Subjective:  Patient ID: [name], female    DOB: March 13, 1936  Age: 85 y.o. MRN: [id number]  CC: Altered Mental Status   HPI [name] presents for a hard fall after she rushed and slipped on the wet marble floor while visiting family in [place] a couple week ago.  She hit the side of her skull, had a skin laceration bled quite a bit.  She was dazed and according to her son Nicki Reaper had a loss of consciousness of under a minute duration.  She felt dazed.  She was taken to ER.  2 staples were applied to her skin laceration on the left occipital area.  CT scan of the C-spine and the brain did not show any bleeding.  She is complaining of some headaches, lack of focus.  No confusion.  Mild fatigue, dizziness.  No new falls.  Repeat head CT scan in [place] was negative as well.     Outpatient Medications Prior to Visit  Medication Sig Dispense Refill   acetaminophen (TYLENOL) 500 MG tablet Take 500 mg by mouth 2 (two) times daily as needed for mild pain.     albuterol (VENTOLIN HFA) 108 (90 Base) MCG/ACT inhaler Take 2 puffs every 4-6 hours as needed     Azelastine HCl 0.15 % SOLN Place into both nostrils. Place into both nostrils.     Calcium Carbonate-Vitamin D (CALCIUM-VITAMIN D) 500-200 MG-UNIT per tablet Take 3 tablets by mouth daily.     clobetasol (TEMOVATE) 0.05 % external solution Apply 1 application topically 2 (two) times daily as needed (on scalp). 50 mL 1   desonide (DESOWEN) 0.05 % cream Apply 1 application topically as needed.     diclofenac Sodium (VOLTAREN) 1 % GEL APPLY 4 GRAMS 4 TIMES DAILY. 200 g 3   EPINEPHrine (EPIPEN 2-PAK) 0.3 mg/0.3 mL IJ SOAJ injection Inject 0.3 mLs (0.3 mg total) into the muscle as needed. 1 Device 1   estradiol (ESTRACE) 0.1 MG/GM vaginal cream Place 1 gram vaginally 2 x a week at hs 42.5 g 1   famotidine (PEPCID) 40 MG tablet Take 1 tablet (40 mg total) by mouth at bedtime. NEEDS OFFICE VISIT FOR ADDITIONAL REFILLS 30 tablet 2   gabapentin  (NEURONTIN) 100 MG capsule Take 100 mg by mouth at bedtime.     ipratropium (ATROVENT) 0.06 % nasal spray Place 1 spray into the nose 3 (three) times daily as needed for rhinitis. 15 mL 0   levocetirizine (XYZAL) 5 MG tablet Take 5 mg by mouth every evening.     LORazepam (ATIVAN) 1 MG tablet TAKE 2-3 TABLETS AT BEDTIME AND 1-2 TABLETS IN THE DAYTIME AS NEEDED. 150 tablet 1   montelukast (SINGULAIR) 10 MG tablet Take 1 tablet (10 mg total) by mouth at bedtime. 90 tablet 1   ondansetron (ZOFRAN) 4 MG tablet Take 1 tablet (4 mg total) by mouth every 6 (six) hours. 12 tablet 0   pantoprazole (PROTONIX) 40 MG tablet TAKE (1) TABLET TWICE A DAY BEFORE MEALS. 60 tablet 5   simvastatin (ZOCOR) 40 MG tablet TAKE 1 TABLET ONCE DAILY. 90 tablet 3   SYNTHROID 50 MCG tablet TAKE 1 TABLET ONCE DAILY BEFORE BREAKFAST. 30 tablet 5   traMADol (ULTRAM) 50 MG tablet Take by mouth every 6 (six) hours as needed.     triamcinolone (NASACORT) 55 MCG/ACT AERO nasal inhaler Place 2 sprays into the nose daily.     triamcinolone cream (KENALOG) 0.1 % Apply 1 application topically 2 (two)  times daily. APPLY TOPICALLY TWICE A DAY     VESICARE 10 MG tablet Take 10 mg by mouth daily as needed (bladder).     azelastine (ASTELIN) 0.1 % nasal spray Place 1 spray into both nostrils at bedtime. (Patient not taking: Reported on 10/29/2021)     betamethasone valerate ointment (VALISONE) 0.1 % Apply 1 application topically 2 (two) times daily. You can use prn itch for up to one week as needed. Not meant for daily long term use. (Patient not taking: Reported on 10/29/2021) 30 g 0   No facility-administered medications prior to visit.    ROS: Review of Systems  Constitutional:  Positive for fatigue. Negative for activity change, appetite change, chills and unexpected weight change.  HENT:  Negative for congestion, mouth sores and sinus pressure.   Eyes:  Negative for visual disturbance.  Respiratory:  Negative for cough and chest  tightness.   Gastrointestinal:  Negative for abdominal pain and nausea.  Genitourinary:  Negative for difficulty urinating, frequency and vaginal pain.  Musculoskeletal:  Negative for back pain and gait problem.  Skin:  Positive for wound. Negative for pallor and rash.  Neurological:  Positive for headaches. Negative for dizziness, tremors, weakness and numbness.  Hematological:  Does not bruise/bleed easily.  Psychiatric/Behavioral:  Positive for decreased concentration. Negative for confusion and sleep disturbance.    Objective:  BP (!) 152/80 (BP Location: Left Arm)   Pulse 80   Temp 97.8 F (36.6 C) (Oral)   SpO2 96%   BP Readings from Last 3 Encounters:  10/29/21 (!) 152/80  10/26/21 (!) 158/71  10/05/21 110/68    Wt Readings from Last 3 Encounters:  10/05/21 108 lb (49 kg)  09/15/21 119 lb (54 kg)  08/19/21 116 lb 9.6 oz (52.9 kg)    Physical Exam Constitutional:      General: She is not in acute distress.    Appearance: Normal appearance. She is well-developed.  HENT:     Head: Normocephalic.     Right Ear: External ear normal.     Left Ear: External ear normal.     Nose: Nose normal.  Eyes:     General:        Right eye: No discharge.        Left eye: No discharge.     Conjunctiva/sclera: Conjunctivae normal.     Pupils: Pupils are equal, round, and reactive to light.  Neck:     Thyroid: No thyromegaly.     Vascular: No JVD.     Trachea: No tracheal deviation.  Cardiovascular:     Rate and Rhythm: Normal rate and regular rhythm.     Heart sounds: Normal heart sounds.  Pulmonary:     Effort: No respiratory distress.     Breath sounds: No stridor. No wheezing.  Abdominal:     General: Bowel sounds are normal. There is no distension.     Palpations: Abdomen is soft. There is no mass.     Tenderness: There is no abdominal tenderness. There is no guarding or rebound.  Musculoskeletal:        General: No tenderness.     Cervical back: Normal range of  motion and neck supple. No rigidity.  Lymphadenopathy:     Cervical: No cervical adenopathy.  Skin:    Findings: No erythema or rash.  Neurological:     Mental Status: She is oriented to person, place, and time.     Cranial Nerves: No cranial nerve deficit.  Motor: No weakness or abnormal muscle tone.     Coordination: Coordination normal.     Gait: Gait normal.     Deep Tendon Reflexes: Reflexes normal.  Psychiatric:        Behavior: Behavior normal.        Thought Content: Thought content normal.        Judgment: Judgment normal.  [name] appears normal.  Left occipital area with a yellow bruise and healing laceration with 2 staples.  Both staples were removed. Neck stiff with postop scar.  Range of motion is reasonable Pupils reactive.  Romberg negative.  Balance, muscle strength are acceptable  Lab Results  Component Value Date   WBC 6.5 08/13/2021   HGB 13.6 08/13/2021   HCT 40.7 08/13/2021   PLT 226.0 R 08/13/2021   GLUCOSE 91 08/13/2021   CHOL 187 08/13/2021   TRIG 234.0 (H) 08/13/2021   HDL 52.90 08/13/2021   LDLDIRECT 98.0 08/13/2021   LDLCALC 108 (H) 03/23/2021   ALT 17 08/13/2021   AST 21 08/13/2021   NA 138 08/13/2021   K 4.2 08/13/2021   CL 101 08/13/2021   CREATININE 0.71 08/13/2021   BUN 12 08/13/2021   CO2 27 08/13/2021   TSH 2.38 08/13/2021   HGBA1C 5.5 06/13/2020    CT Head Wo Contrast  Result Date: 10/26/2021 CLINICAL DATA:  Headache.  Nausea. EXAM: CT HEAD WITHOUT CONTRAST TECHNIQUE: Contiguous axial images were obtained from the base of the skull through the vertex without intravenous contrast. COMPARISON:  03/25/2021 in the brain MR of 04/25/2021. FINDINGS: Brain: A left frontal meningioma of 1.6 cm without significant mass effect or surrounding edema is similar including on 22/2. Otherwise, no mass lesion, hemorrhage, hydrocephalus, acute infarct, intra-axial, or extra-axial fluid collection. Vascular: No hyperdense vessel or unexpected  calcification. Skull: No significant soft tissue swelling. Interval craniotomy at the posterior skull base. Sinuses/Orbits: Normal imaged portions of the orbits and globes. Hypoplastic frontal sinuses. Clear paranasal sinuses and mastoid air cells. Other: None. IMPRESSION: 1. No acute intracranial abnormality. 2. Interval posterior skull base craniotomy. 3. Similar left frontal meningioma. Electronically Signed   By: Abigail Miyamoto M.D.   On: 10/26/2021 13:07    Assessment & Plan:   Problem List Items Addressed This Visit     Concussion    S/p a hard fall after she rushed and slipped on the wet marble floor while visiting family in [place] a couple week ago.  She hit the side of her skull, had a skin laceration bled quite a bit.  She was dazed and according to her son Nicki Reaper had a loss of consciousness of under a minute duration.  She felt dazed.  She was taken to ER.  2 staples were applied to her skin laceration on the left occipital area.  CT scan of the C-spine and the brain did not show any bleeding.  She is complaining of some headaches, lack of focus.  No confusion.  Mild fatigue, dizziness.  No new falls.  Repeat head CT scan in [place] was negative as well.    Concussion natural history was discussed.  Outside records reviewed with the patient.  Rest more.  Activity- mild as tolerated.  Call with problems.      Scalp laceration, sequela      No orders of the defined types were placed in this encounter.     Follow-up: Return in about 4 weeks (around 11/26/2021) for a follow-up visit.  Walker Kehr, MD

## 2021-10-30 NOTE — Progress Notes (Signed)
Office Visit Note  Patient: [name]             Date of Birth: 31-Jul-1936           MRN: [id number]             PCP: Cassandria Anger, MD Referring: Cassandria Anger, MD Visit Date: 11/12/2021 Occupation: @GUAROCC @  Subjective:  Lower back pain   History of Present Illness: [name] is a 85 y.o. female with a history of osteoarthritis and degenerative disc disease.  She states in November she was visiting her family in [place] and fell face forward.  She hit her head on the marble floor.  She states that she had CT scan of her head in [place] which was consistent with concussion.  She came back to [facility] and had another CT scan of her head.  She did not have any increased discomfort in her cervical spine after the fall.  She had cervical spine fusion in the past.  She states that she had good response to the trochanteric bursa injection and she is not having any trochanteric pain.  She states for the last 1 month she has been experiencing increased lower back pain.  The pain is localized and she does not have any radiculopathy.  None of the other joints are painful.  Activities of Daily Living:  Patient reports morning stiffness for 20 minutes.   Patient Reports nocturnal pain.  Difficulty dressing/grooming: Denies Difficulty climbing stairs: Denies Difficulty getting out of chair: Denies Difficulty using hands for taps, buttons, cutlery, and/or writing: Denies  Review of Systems  Constitutional:  Negative for fatigue, night sweats, weight gain and weight loss.  HENT:  Negative for mouth sores, trouble swallowing, trouble swallowing, mouth dryness and nose dryness.   Eyes:  Positive for itching and dryness. Negative for pain, redness and visual disturbance.  Respiratory:  Negative for cough, shortness of breath and difficulty breathing.   Cardiovascular:  Negative for chest pain, palpitations, hypertension, irregular heartbeat and swelling in legs/feet.   Gastrointestinal:  Negative for blood in stool, constipation and diarrhea.  Endocrine: Positive for increased urination.  Genitourinary:  Negative for difficulty urinating and vaginal dryness.  Musculoskeletal:  Positive for joint pain, joint pain and morning stiffness. Negative for joint swelling, myalgias, muscle weakness, muscle tenderness and myalgias.  Skin:  Negative for color change, rash, hair loss, redness, skin tightness, ulcers and sensitivity to sunlight.  Allergic/Immunologic: Negative for susceptible to infections.  Neurological:  Positive for headaches. Negative for dizziness, numbness, memory loss, night sweats and weakness.  Hematological:  Positive for bruising/bleeding tendency. Negative for swollen glands.  Psychiatric/Behavioral:  Negative for depressed mood, confusion and sleep disturbance. The patient is not nervous/anxious.    PMFS History:  Patient Active Problem List   Diagnosis Date Noted   Scalp laceration, sequela 11/02/2021   Concussion 10/29/2021   Scalp itch 08/19/2021   Allergic rhinitis due to animal (cat) (dog) hair and dander 08/07/2021   Allergic rhinitis due to pollen 08/07/2021   Mild intermittent asthma 08/07/2021   Closed fracture of first cervical vertebra ([place]) 08/07/2021   Closed fracture of second cervical vertebra ([place]) 08/07/2021   Cervical spine instability 05/15/2021   Closed 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 05/13/2021   Cervical spinal cord compression ([place]) 05/04/2021   Fall against object 03/25/2021   Hematoma of face, initial encounter 03/25/2021   Degenerative disc disease, cervical 03/11/2021   Spondylolisthesis, cervical region 03/11/2021  Trochanteric bursitis of right hip 09/22/2020   Rheumatoid arthritis involving multiple sites ([place]) 08/20/2020   Elevated blood-pressure reading, without diagnosis of hypertension 07/14/2020   Meningioma ([place]) 03/25/2020   Arthralgia 03/18/2020   RUQ pain  01/02/2020   Cholelithiasis 09/09/2019   Ear pain, bilateral 03/21/2019   Cervical spondylosis 01/05/2019   Neck pain 12/26/2018   Acute pain of left shoulder 12/26/2018   Anxiety 08/22/2018   Contact dermatitis and eczema 06/14/2018   Constipation 11/11/2016   Low back pain 11/10/2016   Leg abrasion 08/12/2016   Contusion of right knee 08/12/2016   Sinusitis, chronic 08/06/2016   Rash and nonspecific skin eruption 04/20/2016   Aortic valve sclerosis 03/12/2015   Contusion of left chest wall 01/09/2015   Fatigue 09/08/2014   Bladder pain 08/19/2014   Increased frequency of urination 08/19/2014   Urinary urgency 08/19/2014   Postherpetic neuralgia 10/02/2013   Impacted cerumen of right ear 07/16/2013   Headache 07/10/2013   Aortic stenosis 04/19/2013   Insomnia 01/24/2013   Cough 08/07/2012   URI (upper respiratory infection) 08/03/2012   Pruritus of skin 03/11/2012   MVP (mitral valve prolapse)    Multiple thyroid nodules 10/04/2011   Herpes zoster 09/07/2011   Essential hypertension, benign 07/27/2011   Atrophic vaginitis    Well adult exam 03/15/2011   Hyperlipemia, mixed 03/15/2011   OVERACTIVE BLADDER 09/02/2010   Hypothyroidism 03/03/2010   CALF PAIN, LEFT 10/27/2009   Belching 07/21/2009   EAR PAIN 12/19/2008   Allergic rhinitis 12/19/2008   Irritable bowel syndrome 09/04/2008   PERSONAL HX COLONIC POLYPS 09/04/2008   GASTRITIS, CHRONIC 09/03/2008   DUODENITIS, WITHOUT HEMORRHAGE 09/03/2008   TIBIALIS TENDINITIS 03/20/2008   OSTEOARTHRITIS 02/25/2008   SWEATING 02/21/2008   LACTOSE INTOLERANCE 11/20/2007   Dyslipidemia 11/20/2007   GERD 11/20/2007   Osteoporosis 11/20/2007    Past Medical History:  Diagnosis Date   Allergic rhinitis    Asthma    Atrophic vaginitis    Baker's cyst    Left-Dr. Aluisio   Cataract    Dr. Katy Fitch   CHF (congestive heart failure) (HCC)    Colon polyps    Fibroid    Gastritis    chronic   GERD (gastroesophageal reflux  disease)    Heart murmur    Hemorrhoids    Hyperlipidemia    Hypothyroidism    IBS (irritable bowel syndrome)    MVP (mitral valve prolapse)    Antibiotics required for dental procedures   OA (osteoarthritis)    Osteoporosis 06/2018   T score -2.2 stable on Prolia   Overactive bladder    Thyroid disease    Thyroid nodule    Torn meniscus    bilateral    Family History  Problem Relation Age of Onset   Heart disease Mother    Hypertension Mother    Osteoporosis Mother    Congestive Heart Failure Mother    Pancreatic cancer Father    Heart attack Brother    Drug abuse Brother        over dose    Healthy Son    Healthy Son    Colon cancer Neg Hx    Colon polyps Neg Hx    Rectal cancer Neg Hx    Stomach cancer Neg Hx    Past Surgical History:  Procedure Laterality Date   CATARACT EXTRACTION, BILATERAL     EYE SURGERY     POSTERIOR CERVICAL FUSION/FORAMINOTOMY N/A 05/15/2021   Procedure: Cervical One Laminectomy with  resection of cyst, Fixation from Occiput to Cervical Four;  Surgeon: Erline Levine, MD;  Location: [facility];  Service: Neurosurgery;  Laterality: N/A;   TONSILLECTOMY     Social History   Social History Narrative   Regular exercise-yes   Daily caffeine use   Immunization History  Administered Date(s) Administered   Fluad Quad(high Dose 65+) 08/04/2019, 09/16/2021   Influenza Whole 09/29/2007, 08/21/2008, 09/02/2010, 07/30/2012   Influenza, High Dose Seasonal PF 09/03/2013, 02/19/2015, 11/12/2015, 08/06/2016, 11/11/2016, 08/20/2017, 08/31/2017, 09/20/2018, 09/12/2019   Influenza,inj,Quad PF,6+ Mos 12/17/2014, 07/30/2015   PFIZER(Purple Top)SARS-COV-2 Vaccination 12/12/2019, 12/31/2019, 07/29/2020, 07/29/2020, 07/31/2020   Pneumococcal Conjugate-13 01/09/2014   Pneumococcal Polysaccharide-23 09/26/2006, 08/12/2015, 11/11/2016, 08/31/2017, 09/10/2020, 09/10/2021   Td 04/14/2010   Tdap 06/18/2020   Typhoid Inactivated 02/23/2013   Zoster, Live 12/09/2006      Objective: Vital Signs: BP 136/74 (BP Location: Right Arm, Patient Position: Sitting, Cuff Size: Normal)   Pulse 80   Ht 5' (1.524 m)   Wt 115 lb (52.2 kg)   BMI 22.46 kg/m    Physical Exam Vitals and nursing note reviewed.  Constitutional:      Appearance: She is well-developed.  HENT:     Head: Normocephalic and atraumatic.  Eyes:     Conjunctiva/sclera: Conjunctivae normal.  Cardiovascular:     Rate and Rhythm: Normal rate and regular rhythm.     Heart sounds: Normal heart sounds.  Pulmonary:     Effort: Pulmonary effort is normal.     Breath sounds: Normal breath sounds.  Abdominal:     General: Bowel sounds are normal.     Palpations: Abdomen is soft.  Musculoskeletal:     Cervical back: Normal range of motion.  Lymphadenopathy:     Cervical: No cervical adenopathy.  Skin:    General: Skin is warm and dry.     Capillary Refill: Capillary refill takes less than 2 seconds.  Neurological:     Mental Status: She is alert and oriented to person, place, and time.  Psychiatric:        Behavior: Behavior normal.     Musculoskeletal Exam: She had very limited lateral rotation in her cervical spine.  She had good range of motion of her lumbar spine with discomfort in the lumbar region.  There was no point tenderness.  Shoulder joints, elbow joints, wrist joints with good range of motion.  She had bilateral CMC PIP and DIP thickening with no synovitis.  Hip joints and knee joints with good range of motion.  There was no tenderness over ankles or MTPs.  CDAI Exam: CDAI Score: -- Patient Global: --; Provider Global: -- Swollen: --; Tender: -- Joint Exam 11/12/2021   No joint exam has been documented for this visit   There is currently no information documented on the homunculus. Go to the Rheumatology activity and complete the homunculus joint exam.  Investigation: No additional findings.  Imaging: CT Head Wo Contrast  Result Date: 10/26/2021 CLINICAL DATA:   Headache.  Nausea. EXAM: CT HEAD WITHOUT CONTRAST TECHNIQUE: Contiguous axial images were obtained from the base of the skull through the vertex without intravenous contrast. COMPARISON:  03/25/2021 in the brain MR of 04/25/2021. FINDINGS: Brain: A left frontal meningioma of 1.6 cm without significant mass effect or surrounding edema is similar including on 22/2. Otherwise, no mass lesion, hemorrhage, hydrocephalus, acute infarct, intra-axial, or extra-axial fluid collection. Vascular: No hyperdense vessel or unexpected calcification. Skull: No significant soft tissue swelling. Interval craniotomy at the posterior skull base.  Sinuses/Orbits: Normal imaged portions of the orbits and globes. Hypoplastic frontal sinuses. Clear paranasal sinuses and mastoid air cells. Other: None. IMPRESSION: 1. No acute intracranial abnormality. 2. Interval posterior skull base craniotomy. 3. Similar left frontal meningioma. Electronically Signed   By: Abigail Miyamoto M.D.   On: 10/26/2021 13:07   XR Lumbar Spine 2-3 Views  Result Date: 11/12/2021 Multilevel spondylosis with severe narrowing between L1-L2, L2-L3, L3-L4 and L4-L5 was noted.  Spondylolisthesis between L1-L2 and L4-L5 was noted.  Facet joint arthropathy with foraminal narrowing was noted.  No interval change was noted when compared to the x-rays of 2021.  Lumbar scoliosis was noted. Impression: These findings are consistent with multilevel spondylosis, spondylolisthesis and facet joint arthropathy.   Recent Labs: Lab Results  Component Value Date   WBC 6.5 08/13/2021   HGB 13.6 08/13/2021   PLT 226.0 R 08/13/2021   NA 138 08/13/2021   K 4.2 08/13/2021   CL 101 08/13/2021   CO2 27 08/13/2021   GLUCOSE 91 08/13/2021   BUN 12 08/13/2021   CREATININE 0.71 08/13/2021   BILITOT 0.6 08/13/2021   ALKPHOS 41 08/13/2021   AST 21 08/13/2021   ALT 17 08/13/2021   PROT 7.5 08/13/2021   ALBUMIN 4.3 08/13/2021   CALCIUM 9.7 08/13/2021   GFRAA 87 07/17/2020     Speciality Comments: No specialty comments available.  Procedures:  No procedures performed Allergies: Bee venom, Clarithromycin, Doxycycline, Neosporin [neomycin-bacitracin zn-polymyx], and Hydrocortisone   Assessment / Plan:     Visit Diagnoses: DDD (degenerative disc disease), cervical - S/p fusion for C1-C2 instability.  She had limited range of motion of her cervical spine without discomfort.  Chronic midline low back pain without sciatica -she has been experiencing increased lower back pain since her fall in November.  She had no point tenderness.  Plan: XR Lumbar Spine 2-3 Views.  X-rays obtained today did not show any vertebral fracture.  She has severe multilevel spondylosis with a spondylolisthesis or scoliosis.  She also has facet joint arthropathy with foraminal narrowing.  There was no interval change when compared to the x-rays of 2021.  X-ray findings were reviewed with the patient.  I referred her to physical therapy.  DDD (degenerative disc disease), lumbar-chronic pain.  Chronic SI joint pain-she denies any discomfort today.  Trochanteric bursitis of both hips-the discomfort in her trochanteric bursa resolved after the cortisone injection.  Primary osteoarthritis of both hands - Dr. Sherrian Divers performed ultrasound of bilateral hands which was negative for synovitis.  All autoimmune work-up was negative.  She continues to have pain and discomfort in her hands.  Joint protection muscle strengthening was discussed.  Primary osteoarthritis of both feet-she is currently not having discomfort.  Age-related osteoporosis without current pathological fracture - She is on Prolia by her GYN.   Essential hypertension, benign-her blood pressure was normal today.  Other medical problems are listed as follows:  MVP (mitral valve prolapse)  Aortic valve sclerosis  History of gastroesophageal reflux (GERD)  History of hyperlipidemia  History of IBS  Hx of colonic  polyps  Multiple thyroid nodules  Hypothyroidism due to acquired atrophy of thyroid  Calculus of gallbladder without cholecystitis without obstruction  Orders: Orders Placed This Encounter  Procedures   XR Lumbar Spine 2-3 Views    No orders of the defined types were placed in this encounter.   Face-to-face time spent with patient was 30 minutes. Greater than 50% of time was spent in counseling and coordination of care.  Follow-Up Instructions: Return in about 6 months (around 05/13/2022).   Bo Merino, MD  Note - This record has been created using Editor, commissioning.  Chart creation errors have been sought, but may not always  have been located. Such creation errors do not reflect on  the standard of medical care.

## 2021-11-02 ENCOUNTER — Ambulatory Visit: Payer: Medicare Other

## 2021-11-02 ENCOUNTER — Encounter: Payer: Self-pay | Admitting: Internal Medicine

## 2021-11-02 DIAGNOSIS — S0101XS Laceration without foreign body of scalp, sequela: Secondary | ICD-10-CM | POA: Insufficient documentation

## 2021-11-02 NOTE — Progress Notes (Deleted)
Patient in today for seventh Prolia injection. Patient's initial calcium level was obtained on 08/13/21 .  Result: 9.2.  Last AEX: 07/02/20 Last BMD: 10/29/20  Injection given in {RIGHT/LEFT:20294} ***.  Patient tolerated injection well.  Routed to provider for review.

## 2021-11-02 NOTE — Assessment & Plan Note (Signed)
S/p a hard fall after she rushed and slipped on the wet marble floor while visiting family in [place] a couple week ago.  She hit the side of her skull, had a skin laceration bled quite a bit.  She was dazed and according to her son Nicki Reaper had a loss of consciousness of under a minute duration.  She felt dazed.  She was taken to ER.  2 staples were applied to her skin laceration on the left occipital area.  CT scan of the C-spine and the brain did not show any bleeding.  She is complaining of some headaches, lack of focus.  No confusion.  Mild fatigue, dizziness.  No new falls.  Repeat head CT scan in [place] was negative as well.    Concussion natural history was discussed.  Outside records reviewed with the patient.  Rest more.  Activity- mild as tolerated.  Call with problems.

## 2021-11-03 ENCOUNTER — Other Ambulatory Visit: Payer: Self-pay

## 2021-11-03 ENCOUNTER — Ambulatory Visit: Payer: Medicare Other

## 2021-11-03 DIAGNOSIS — R3989 Other symptoms and signs involving the genitourinary system: Secondary | ICD-10-CM | POA: Diagnosis not present

## 2021-11-03 DIAGNOSIS — R350 Frequency of micturition: Secondary | ICD-10-CM | POA: Diagnosis not present

## 2021-11-05 NOTE — Telephone Encounter (Signed)
Patient scheduled for 11-16-21 at 1130.

## 2021-11-05 NOTE — Telephone Encounter (Signed)
Detailed message left for patient to return call to office to reschedule nurse visit for Prolia injection.

## 2021-11-11 ENCOUNTER — Telehealth: Payer: Self-pay | Admitting: Internal Medicine

## 2021-11-11 NOTE — Telephone Encounter (Signed)
1.Medication Requested: LORazepam (ATIVAN) 1 MG tablet  2. Pharmacy (Name, [place], [facility]): [place], [address] [place]  Phone:  [phone] Fax:  [phone]   3. On Med List: yes  4. Last Visit with PCP: 12.01.22  5. Next visit date with PCP: 12.21.22   Agent: Please be advised that RX refills may take up to 3 business days. We ask that you follow-up with your pharmacy.

## 2021-11-12 ENCOUNTER — Encounter: Payer: Self-pay | Admitting: Rheumatology

## 2021-11-12 ENCOUNTER — Ambulatory Visit: Payer: Self-pay

## 2021-11-12 ENCOUNTER — Ambulatory Visit (INDEPENDENT_AMBULATORY_CARE_PROVIDER_SITE_OTHER): Payer: Medicare Other | Admitting: Rheumatology

## 2021-11-12 ENCOUNTER — Other Ambulatory Visit: Payer: Self-pay

## 2021-11-12 VITALS — BP 136/74 | HR 80 | Ht 60.0 in | Wt 115.0 lb

## 2021-11-12 DIAGNOSIS — M19072 Primary osteoarthritis, left ankle and foot: Secondary | ICD-10-CM

## 2021-11-12 DIAGNOSIS — M5136 Other intervertebral disc degeneration, lumbar region: Secondary | ICD-10-CM | POA: Diagnosis not present

## 2021-11-12 DIAGNOSIS — E034 Atrophy of thyroid (acquired): Secondary | ICD-10-CM

## 2021-11-12 DIAGNOSIS — M533 Sacrococcygeal disorders, not elsewhere classified: Secondary | ICD-10-CM | POA: Diagnosis not present

## 2021-11-12 DIAGNOSIS — K8020 Calculus of gallbladder without cholecystitis without obstruction: Secondary | ICD-10-CM

## 2021-11-12 DIAGNOSIS — M7061 Trochanteric bursitis, right hip: Secondary | ICD-10-CM | POA: Diagnosis not present

## 2021-11-12 DIAGNOSIS — Z8639 Personal history of other endocrine, nutritional and metabolic disease: Secondary | ICD-10-CM

## 2021-11-12 DIAGNOSIS — I341 Nonrheumatic mitral (valve) prolapse: Secondary | ICD-10-CM | POA: Diagnosis not present

## 2021-11-12 DIAGNOSIS — I358 Other nonrheumatic aortic valve disorders: Secondary | ICD-10-CM | POA: Diagnosis not present

## 2021-11-12 DIAGNOSIS — G8929 Other chronic pain: Secondary | ICD-10-CM

## 2021-11-12 DIAGNOSIS — Z8719 Personal history of other diseases of the digestive system: Secondary | ICD-10-CM | POA: Diagnosis not present

## 2021-11-12 DIAGNOSIS — I10 Essential (primary) hypertension: Secondary | ICD-10-CM

## 2021-11-12 DIAGNOSIS — M810 Age-related osteoporosis without current pathological fracture: Secondary | ICD-10-CM

## 2021-11-12 DIAGNOSIS — H10413 Chronic giant papillary conjunctivitis, bilateral: Secondary | ICD-10-CM | POA: Diagnosis not present

## 2021-11-12 DIAGNOSIS — M7062 Trochanteric bursitis, left hip: Secondary | ICD-10-CM

## 2021-11-12 DIAGNOSIS — M5030 Other cervical disc degeneration, unspecified cervical region: Secondary | ICD-10-CM | POA: Diagnosis not present

## 2021-11-12 DIAGNOSIS — M545 Low back pain, unspecified: Secondary | ICD-10-CM | POA: Diagnosis not present

## 2021-11-12 DIAGNOSIS — Z86010 Personal history of colonic polyps: Secondary | ICD-10-CM

## 2021-11-12 DIAGNOSIS — M19071 Primary osteoarthritis, right ankle and foot: Secondary | ICD-10-CM

## 2021-11-12 DIAGNOSIS — M19041 Primary osteoarthritis, right hand: Secondary | ICD-10-CM

## 2021-11-12 DIAGNOSIS — E042 Nontoxic multinodular goiter: Secondary | ICD-10-CM

## 2021-11-12 DIAGNOSIS — M19042 Primary osteoarthritis, left hand: Secondary | ICD-10-CM

## 2021-11-12 NOTE — Patient Instructions (Signed)

## 2021-11-13 MED ORDER — LORAZEPAM 1 MG PO TABS: ORAL_TABLET | ORAL | 2 refills | Status: DC

## 2021-11-13 NOTE — Telephone Encounter (Signed)
Okay.  Done.  Thanks 

## 2021-11-16 ENCOUNTER — Ambulatory Visit (INDEPENDENT_AMBULATORY_CARE_PROVIDER_SITE_OTHER): Payer: Medicare Other

## 2021-11-16 ENCOUNTER — Other Ambulatory Visit: Payer: Self-pay

## 2021-11-16 ENCOUNTER — Other Ambulatory Visit (INDEPENDENT_AMBULATORY_CARE_PROVIDER_SITE_OTHER): Payer: Medicare Other

## 2021-11-16 ENCOUNTER — Telehealth: Payer: Self-pay | Admitting: *Deleted

## 2021-11-16 DIAGNOSIS — E785 Hyperlipidemia, unspecified: Secondary | ICD-10-CM | POA: Diagnosis not present

## 2021-11-16 DIAGNOSIS — N3000 Acute cystitis without hematuria: Secondary | ICD-10-CM

## 2021-11-16 DIAGNOSIS — F419 Anxiety disorder, unspecified: Secondary | ICD-10-CM

## 2021-11-16 DIAGNOSIS — M810 Age-related osteoporosis without current pathological fracture: Secondary | ICD-10-CM

## 2021-11-16 LAB — COMPREHENSIVE METABOLIC PANEL
ALT: 16 U/L (ref 0–35)
AST: 20 U/L (ref 0–37)
Albumin: 4.3 g/dL (ref 3.5–5.2)
Alkaline Phosphatase: 34 U/L — ABNORMAL LOW (ref 39–117)
BUN: 11 mg/dL (ref 6–23)
CO2: 25 mEq/L (ref 19–32)
Calcium: 9.5 mg/dL (ref 8.4–10.5)
Chloride: 101 mEq/L (ref 96–112)
Creatinine, Ser: 0.72 mg/dL (ref 0.40–1.20)
GFR: 77.35 mL/min (ref >60.00)
Glucose, Bld: 99 mg/dL (ref 70–99)
Potassium: 3.8 mEq/L (ref 3.5–5.1)
Sodium: 136 mEq/L (ref 135–145)
Total Bilirubin: 0.6 mg/dL (ref 0.2–1.2)
Total Protein: 7.4 g/dL (ref 6.0–8.3)

## 2021-11-16 LAB — URINALYSIS, ROUTINE W REFLEX MICROSCOPIC
Bilirubin Urine: NEGATIVE
Hgb urine dipstick: NEGATIVE
Ketones, ur: NEGATIVE
Leukocytes,Ua: NEGATIVE
Nitrite: NEGATIVE
RBC / HPF: NONE SEEN (ref 0–?)
Specific Gravity, Urine: 1.02 (ref 1.000–1.030)
Total Protein, Urine: NEGATIVE
Urine Glucose: NEGATIVE
Urobilinogen, UA: 0.2 (ref 0.0–1.0)
pH: 6 (ref 5.0–8.0)

## 2021-11-16 LAB — TSH: TSH: 1.32 u[IU]/mL (ref 0.35–5.50)

## 2021-11-16 MED ORDER — DENOSUMAB 60 MG/ML ~~LOC~~ SOSY
60.0000 mg | PREFILLED_SYRINGE | Freq: Once | SUBCUTANEOUS | Status: AC
Start: 2021-11-16 — End: 2021-11-16
  Administered 2021-11-16: 11:00:00 60 mg via SUBCUTANEOUS

## 2021-11-16 NOTE — Progress Notes (Signed)
Patient in today for a Prolia injection. Patient's initial calcium level was obtained on 08/13/21.  Result: 9.7.  Last AEX: 07/31/21  Last BMD: 10/29/20   Injection given in left Arm.  Patient tolerated injection well.  Routed to provider for review.

## 2021-11-16 NOTE — Telephone Encounter (Signed)
Rec;d inbox from [place] stating.Marland KitchenMarland KitchenMarland Kitchen[10:41 AM] [name] [name] [id number] is down here for a lab appt and wants her UA done, has UTI symptoms. Will he add this for her? Ask Dr. Camila Li he ok UA.[name] Placed order and notified Almyra Free.Marland KitchenJohny Chess

## 2021-11-18 ENCOUNTER — Other Ambulatory Visit: Payer: Self-pay

## 2021-11-18 ENCOUNTER — Encounter: Payer: Self-pay | Admitting: Internal Medicine

## 2021-11-18 ENCOUNTER — Ambulatory Visit (INDEPENDENT_AMBULATORY_CARE_PROVIDER_SITE_OTHER): Payer: Medicare Other

## 2021-11-18 ENCOUNTER — Ambulatory Visit (INDEPENDENT_AMBULATORY_CARE_PROVIDER_SITE_OTHER): Payer: Medicare Other | Admitting: Internal Medicine

## 2021-11-18 ENCOUNTER — Ambulatory Visit: Payer: Medicare Other

## 2021-11-18 VITALS — BP 120/68 | HR 85 | Temp 98.3°F | Ht 60.0 in | Wt 115.0 lb

## 2021-11-18 DIAGNOSIS — K5900 Constipation, unspecified: Secondary | ICD-10-CM | POA: Diagnosis not present

## 2021-11-18 DIAGNOSIS — R198 Other specified symptoms and signs involving the digestive system and abdomen: Secondary | ICD-10-CM | POA: Diagnosis not present

## 2021-11-18 DIAGNOSIS — F419 Anxiety disorder, unspecified: Secondary | ICD-10-CM

## 2021-11-18 DIAGNOSIS — S060X1A Concussion with loss of consciousness of 30 minutes or less, initial encounter: Secondary | ICD-10-CM

## 2021-11-18 DIAGNOSIS — I10 Essential (primary) hypertension: Secondary | ICD-10-CM | POA: Diagnosis not present

## 2021-11-18 DIAGNOSIS — E785 Hyperlipidemia, unspecified: Secondary | ICD-10-CM

## 2021-11-18 DIAGNOSIS — Z1211 Encounter for screening for malignant neoplasm of colon: Secondary | ICD-10-CM

## 2021-11-18 NOTE — Telephone Encounter (Signed)
Patient received Prolia on 11-16-21. Summary of benefits scanned into Epic.  Encounter closed.

## 2021-11-18 NOTE — Assessment & Plan Note (Signed)
R/o constipation - abx X ray Labs were ok

## 2021-11-18 NOTE — Assessment & Plan Note (Signed)
Post-concussion HAs. Discussed

## 2021-11-18 NOTE — Assessment & Plan Note (Signed)
Cont on Simvastatin 

## 2021-11-18 NOTE — Assessment & Plan Note (Signed)
NAS diet Cardiac CT scan for calcium scoring offered

## 2021-11-18 NOTE — Assessment & Plan Note (Signed)
Lexapro - not taking Weighted blanket

## 2021-11-18 NOTE — Progress Notes (Signed)
Subjective:  Patient ID: [name], female    DOB: Feb 22, 1936  Age: 85 y.o. MRN: [id number]  CC: Follow-up (3 month f/u)   HPI [name] presents for post-concussion HAs, fatigues C/o hard stools - erratic; sometimes unexpected small BM w/pressure  Outpatient Medications Prior to Visit  Medication Sig Dispense Refill   acetaminophen (TYLENOL) 500 MG tablet Take 500 mg by mouth 2 (two) times daily as needed for mild pain.     albuterol (VENTOLIN HFA) 108 (90 Base) MCG/ACT inhaler Take 2 puffs every 4-6 hours as needed     Azelastine HCl 0.15 % SOLN Place into both nostrils. Place into both nostrils.     Calcium Carbonate-Vitamin D (CALCIUM-VITAMIN D) 500-200 MG-UNIT per tablet Take 3 tablets by mouth daily.     clobetasol (TEMOVATE) 0.05 % external solution Apply 1 application topically 2 (two) times daily as needed (on scalp). 50 mL 1   desonide (DESOWEN) 0.05 % cream Apply 1 application topically as needed.     diclofenac Sodium (VOLTAREN) 1 % GEL APPLY 4 GRAMS 4 TIMES DAILY. 200 g 3   EPINEPHrine (EPIPEN 2-PAK) 0.3 mg/0.3 mL IJ SOAJ injection Inject 0.3 mLs (0.3 mg total) into the muscle as needed. 1 Device 1   estradiol (ESTRACE) 0.1 MG/GM vaginal cream Place 1 gram vaginally 2 x a week at hs 42.5 g 1   famotidine (PEPCID) 40 MG tablet Take 1 tablet (40 mg total) by mouth at bedtime. NEEDS OFFICE VISIT FOR ADDITIONAL REFILLS 30 tablet 2   gabapentin (NEURONTIN) 100 MG capsule Take 100 mg by mouth at bedtime.     ipratropium (ATROVENT) 0.06 % nasal spray Place 1 spray into the nose 3 (three) times daily as needed for rhinitis. 15 mL 0   levocetirizine (XYZAL) 5 MG tablet Take 5 mg by mouth every evening.     LORazepam (ATIVAN) 1 MG tablet Take 2-3 tablets at bedtime and 1-2 tablets in the daytime as needed. 150 tablet 2   montelukast (SINGULAIR) 10 MG tablet Take 1 tablet (10 mg total) by mouth at bedtime. 90 tablet 1   ondansetron (ZOFRAN) 4 MG tablet Take 1 tablet  (4 mg total) by mouth every 6 (six) hours. 12 tablet 0   pantoprazole (PROTONIX) 40 MG tablet TAKE (1) TABLET TWICE A DAY BEFORE MEALS. 60 tablet 5   simvastatin (ZOCOR) 40 MG tablet TAKE 1 TABLET ONCE DAILY. 90 tablet 3   SYNTHROID 50 MCG tablet TAKE 1 TABLET ONCE DAILY BEFORE BREAKFAST. 30 tablet 5   traMADol (ULTRAM) 50 MG tablet Take by mouth every 6 (six) hours as needed.     triamcinolone (NASACORT) 55 MCG/ACT AERO nasal inhaler Place 2 sprays into the nose daily.     triamcinolone cream (KENALOG) 0.1 % Apply 1 application topically 2 (two) times daily. APPLY TOPICALLY TWICE A DAY     VESICARE 10 MG tablet Take 10 mg by mouth daily as needed (bladder).     No facility-administered medications prior to visit.    ROS: Review of Systems  Constitutional:  Positive for fatigue. Negative for activity change, appetite change, chills and unexpected weight change.  HENT:  Negative for congestion, mouth sores and sinus pressure.   Eyes:  Negative for visual disturbance.  Respiratory:  Negative for cough and chest tightness.   Gastrointestinal:  Negative for abdominal pain and nausea.  Genitourinary:  Negative for difficulty urinating, frequency and vaginal pain.  Musculoskeletal:  Positive for neck pain and neck stiffness.  Negative for back pain and gait problem.  Skin:  Negative for pallor and rash.  Neurological:  Positive for headaches. Negative for dizziness, tremors, weakness and numbness.  Psychiatric/Behavioral:  Negative for confusion and sleep disturbance.    Objective:  BP 120/68 (BP Location: Left Arm)    Pulse 85    Temp 98.3 F (36.8 C) (Oral)    Ht 5' (1.524 m)    Wt 115 lb (52.2 kg)    SpO2 96%    BMI 22.46 kg/m   BP Readings from Last 3 Encounters:  11/18/21 120/68  11/12/21 136/74  10/29/21 (!) 152/80    Wt Readings from Last 3 Encounters:  11/18/21 115 lb (52.2 kg)  11/12/21 115 lb (52.2 kg)  10/05/21 108 lb (49 kg)    Physical Exam Constitutional:       General: She is not in acute distress.    Appearance: She is well-developed.  HENT:     Head: Normocephalic.     Right Ear: External ear normal.     Left Ear: External ear normal.     Nose: Nose normal.  Eyes:     General:        Right eye: No discharge.        Left eye: No discharge.     Conjunctiva/sclera: Conjunctivae normal.     Pupils: Pupils are equal, round, and reactive to light.  Neck:     Thyroid: No thyromegaly.     Vascular: No JVD.     Trachea: No tracheal deviation.  Cardiovascular:     Rate and Rhythm: Normal rate and regular rhythm.     Heart sounds: Normal heart sounds.  Pulmonary:     Effort: No respiratory distress.     Breath sounds: No stridor. No wheezing.  Abdominal:     General: Bowel sounds are normal. There is no distension.     Palpations: Abdomen is soft. There is no mass.     Tenderness: There is no abdominal tenderness. There is no guarding or rebound.  Musculoskeletal:        General: No tenderness.     Cervical back: Normal range of motion and neck supple. No rigidity.  Lymphadenopathy:     Cervical: No cervical adenopathy.  Skin:    Findings: No erythema or rash.  Neurological:     Mental Status: She is oriented to person, place, and time.     Cranial Nerves: No cranial nerve deficit.     Motor: No abnormal muscle tone.     Coordination: Coordination normal.     Deep Tendon Reflexes: Reflexes normal.  Psychiatric:        Behavior: Behavior normal.        Thought Content: Thought content normal.        Judgment: Judgment normal.    Lab Results  Component Value Date   WBC 6.5 08/13/2021   HGB 13.6 08/13/2021   HCT 40.7 08/13/2021   PLT 226.0 R 08/13/2021   GLUCOSE 99 11/16/2021   CHOL 187 08/13/2021   TRIG 234.0 (H) 08/13/2021   HDL 52.90 08/13/2021   LDLDIRECT 98.0 08/13/2021   LDLCALC 108 (H) 03/23/2021   ALT 16 11/16/2021   AST 20 11/16/2021   NA 136 11/16/2021   K 3.8 11/16/2021   CL 101 11/16/2021   CREATININE 0.72  11/16/2021   BUN 11 11/16/2021   CO2 25 11/16/2021   TSH 1.32 11/16/2021   HGBA1C 5.5 06/13/2020    CT  Head Wo Contrast  Result Date: 10/26/2021 CLINICAL DATA:  Headache.  Nausea. EXAM: CT HEAD WITHOUT CONTRAST TECHNIQUE: Contiguous axial images were obtained from the base of the skull through the vertex without intravenous contrast. COMPARISON:  03/25/2021 in the brain MR of 04/25/2021. FINDINGS: Brain: A left frontal meningioma of 1.6 cm without significant mass effect or surrounding edema is similar including on 22/2. Otherwise, no mass lesion, hemorrhage, hydrocephalus, acute infarct, intra-axial, or extra-axial fluid collection. Vascular: No hyperdense vessel or unexpected calcification. Skull: No significant soft tissue swelling. Interval craniotomy at the posterior skull base. Sinuses/Orbits: Normal imaged portions of the orbits and globes. Hypoplastic frontal sinuses. Clear paranasal sinuses and mastoid air cells. Other: None. IMPRESSION: 1. No acute intracranial abnormality. 2. Interval posterior skull base craniotomy. 3. Similar left frontal meningioma. Electronically Signed   By: Abigail Miyamoto M.D.   On: 10/26/2021 13:07    Assessment & Plan:   Problem List Items Addressed This Visit     Anxiety    Lexapro - not taking Weighted blanket      Concussion    Post-concussion HAs. Discussed      Dyslipidemia    Cont on Simvastatin       Essential hypertension, benign    NAS diet Cardiac CT scan for calcium scoring offered       Irregular bowel habits    R/o constipation - abx X ray Labs were ok      Relevant Orders   DG Abd 2 Views   Other Visit Diagnoses     Colon cancer screening    -  Primary   Relevant Orders   Cologuard         No orders of the defined types were placed in this encounter.     Follow-up: No follow-ups on file.  Walker Kehr, MD

## 2021-11-19 ENCOUNTER — Telehealth: Payer: Self-pay | Admitting: Internal Medicine

## 2021-11-19 ENCOUNTER — Ambulatory Visit: Payer: Medicare Other

## 2021-11-19 DIAGNOSIS — R3915 Urgency of urination: Secondary | ICD-10-CM

## 2021-11-19 DIAGNOSIS — E785 Hyperlipidemia, unspecified: Secondary | ICD-10-CM

## 2021-11-19 DIAGNOSIS — G4451 Hemicrania continua: Secondary | ICD-10-CM

## 2021-11-19 DIAGNOSIS — F419 Anxiety disorder, unspecified: Secondary | ICD-10-CM

## 2021-11-19 NOTE — Telephone Encounter (Signed)
Patient requesting order for labs prior to 02-24-2022 18mo f/u  Advised patient due to her medicare insurance, labs are recommended to be done during or after ov to cover cost  Patient understood and stated she would still prefer to have labs done prior to [facility]

## 2021-11-23 MED ORDER — POLYETHYLENE GLYCOL 3350 17 GM/SCOOP PO POWD: 17.0000 g | Freq: Two times a day (BID) | ORAL | 3 refills | Status: DC | PRN

## 2021-11-23 NOTE — Telephone Encounter (Signed)
Okay.  Thanks.

## 2021-11-24 ENCOUNTER — Telehealth: Payer: Self-pay | Admitting: Internal Medicine

## 2021-11-24 ENCOUNTER — Telehealth (INDEPENDENT_AMBULATORY_CARE_PROVIDER_SITE_OTHER): Payer: Medicare Other | Admitting: Family Medicine

## 2021-11-24 ENCOUNTER — Encounter: Payer: Self-pay | Admitting: Family Medicine

## 2021-11-24 VITALS — Ht 60.0 in

## 2021-11-24 DIAGNOSIS — U071 COVID-19: Secondary | ICD-10-CM

## 2021-11-24 MED ORDER — NIRMATRELVIR/RITONAVIR (PAXLOVID)TABLET: 3.0000 | ORAL_TABLET | Freq: Two times a day (BID) | ORAL | 0 refills | Status: AC

## 2021-11-24 NOTE — Telephone Encounter (Signed)
Patient calling in  Covid+ 12.27.22.. has vv scheduled w/ Dr. Martinique this afternoon  Patient wants to make Dr. Alain Marion aware & is requesting a cb from nurse in regards to this [phone]

## 2021-11-24 NOTE — Progress Notes (Signed)
Virtual Visit via Telephone Note I connected with [name] on 11/24/21 at  4:00 PM EST by telephone and verified that I am speaking with the correct person using two identifiers.   I discussed the limitations, risks, security and privacy concerns of performing an evaluation and management service by telephone and the availability of in person appointments. I also discussed with the patient that there may be a patient responsible charge related to this service. The patient expressed understanding and agreed to proceed.  Location patient: home Location provider: work office Participants present for the call: patient, provider Patient did not have a visit in the prior 7 days to address this/these issue(s).  Chief Complaint  Patient presents with   Covid Positive   History of Present Illness: [name] is a 85 y.o.female with hx of dyslipidemia, OA,allergies,and anxiety c/o a day of respiratory symptoms. Started feeling "bed" last night. Frontal pressure headache, "not terrible." + Fatigue, decreased appetite, rhinorrhea, nasal congestion, and postnasal drainage. Negative for chest pain but she has felt "heavy" chest sensation.  Negative for fever, chills, anosmia,ageusia,sore throat, cough, wheezing, dyspnea, abdominal pain, nausea, vomiting, changes in bowel habits, body aches, urinary symptoms, or skin rash.  She has taken Tylenol and applying vick vapor rub on chest.  Her husband diagnosed with COVID-19 infection recently, he is on Paxlovid, she would like a prescription send to her pharmacy.  Observations/Objective: Patient sounds cheerful and well on the phone. I do not appreciate any SOB,cough,or wheezing. Speech and thought processing are grossly intact. Patient reported vitals:Ht 5' (1.524 m)    BMI 22.46 kg/m   Assessment and Plan:  1. COVID-19 virus infection We discussed Dx,possible complications and treatment options. She has a mild case with risk for  complications. We discussed oral antiviral options and side effects. She would like to start Paxlovid. Medications reviewed and the risk of med interaction discussed. She was instructed to hold on Simvastatin and vesicare while on antiviral medication. Symptomatic treatment with plenty of fluids,rest,tylenol 500 mg 3-4 times per day prn. Monitor for new symptoms. 7 days of quarantine. Throat lozenges if sore throat starts. Explained that she may develop cough. Clearly instructed about warning signs.  - nirmatrelvir/ritonavir EUA (PAXLOVID) 20 x 150 MG & 10 x 100MG  TABS; Take 3 tablets by mouth 2 (two) times daily for 5 days. (Take nirmatrelvir 150 mg two tablets twice daily for 5 days and ritonavir 100 mg one tablet twice daily for 5 days) Patient GFR is 77 in 10/2021.  Dispense: 30 tablet; Refill: 0  Follow Up Instructions:  Return if symptoms worsen or fail to improve.  I did not refer this patient for an OV in the next 24 hours for this/these issue(s).  I discussed the assessment and treatment plan with the patient. Ms. Moisan was provided an opportunity to ask questions and all were answered. She agreed with the plan and demonstrated an understanding of the instructions.   The patient was advised to call back or seek an in-person evaluation if the symptoms worsen or if the condition fails to improve as anticipated.  I provided  9 minutes of non-face-to-face time during this encounter. Betty G. Martinique, MD  [facility]. [place] office.

## 2021-11-25 ENCOUNTER — Ambulatory Visit: Payer: Medicare Other

## 2021-12-10 DIAGNOSIS — Z1211 Encounter for screening for malignant neoplasm of colon: Secondary | ICD-10-CM | POA: Diagnosis not present

## 2021-12-14 ENCOUNTER — Other Ambulatory Visit: Payer: Self-pay | Admitting: Internal Medicine

## 2021-12-16 LAB — COLOGUARD: COLOGUARD: NEGATIVE

## 2022-01-05 DIAGNOSIS — H10413 Chronic giant papillary conjunctivitis, bilateral: Secondary | ICD-10-CM | POA: Diagnosis not present

## 2022-01-06 ENCOUNTER — Ambulatory Visit
Admission: RE | Admit: 2022-01-06 | Discharge: 2022-01-06 | Disposition: A | Discharge status: Home / Self Care | Payer: Medicare Other | Source: Ambulatory Visit | Attending: Internal Medicine | Admitting: Internal Medicine

## 2022-01-06 DIAGNOSIS — Z1231 Encounter for screening mammogram for malignant neoplasm of breast: Secondary | ICD-10-CM | POA: Diagnosis not present

## 2022-01-12 ENCOUNTER — Telehealth: Payer: Self-pay

## 2022-01-12 NOTE — Telephone Encounter (Signed)
Spoke with Ria Comment and advised we have faxed 2 sets of paperwork on Friday that we received confirmation they went through. Advised we are about to fax a third set of paperwork.

## 2022-01-12 NOTE — Telephone Encounter (Signed)
Ria Comment from therapy at [facility] left a voicemail stating we faxed updated paperwork for Dr. Estanislado Pandy to complete for billing certification since we didn't get it signed within the month span required.  Please call back to confirm it has been received.   Phone # [phone]

## 2022-01-21 DIAGNOSIS — H524 Presbyopia: Secondary | ICD-10-CM | POA: Diagnosis not present

## 2022-01-21 DIAGNOSIS — H10413 Chronic giant papillary conjunctivitis, bilateral: Secondary | ICD-10-CM | POA: Diagnosis not present

## 2022-01-22 DIAGNOSIS — M5451 Vertebrogenic low back pain: Secondary | ICD-10-CM | POA: Diagnosis not present

## 2022-01-29 DIAGNOSIS — M542 Cervicalgia: Secondary | ICD-10-CM | POA: Diagnosis not present

## 2022-02-04 DIAGNOSIS — M5416 Radiculopathy, lumbar region: Secondary | ICD-10-CM | POA: Diagnosis not present

## 2022-02-09 DIAGNOSIS — I781 Nevus, non-neoplastic: Secondary | ICD-10-CM | POA: Diagnosis not present

## 2022-02-09 DIAGNOSIS — Z23 Encounter for immunization: Secondary | ICD-10-CM | POA: Diagnosis not present

## 2022-02-09 DIAGNOSIS — L509 Urticaria, unspecified: Secondary | ICD-10-CM | POA: Diagnosis not present

## 2022-02-09 DIAGNOSIS — L219 Seborrheic dermatitis, unspecified: Secondary | ICD-10-CM | POA: Diagnosis not present

## 2022-02-17 ENCOUNTER — Other Ambulatory Visit (INDEPENDENT_AMBULATORY_CARE_PROVIDER_SITE_OTHER): Payer: Medicare Other

## 2022-02-17 ENCOUNTER — Other Ambulatory Visit: Payer: Self-pay

## 2022-02-17 DIAGNOSIS — F419 Anxiety disorder, unspecified: Secondary | ICD-10-CM | POA: Diagnosis not present

## 2022-02-17 DIAGNOSIS — G4451 Hemicrania continua: Secondary | ICD-10-CM | POA: Diagnosis not present

## 2022-02-17 DIAGNOSIS — R3915 Urgency of urination: Secondary | ICD-10-CM

## 2022-02-17 DIAGNOSIS — E785 Hyperlipidemia, unspecified: Secondary | ICD-10-CM | POA: Diagnosis not present

## 2022-02-17 LAB — CBC WITH DIFFERENTIAL/PLATELET
Basophils Absolute: 0.1 10*3/uL (ref 0.0–0.1)
Basophils Relative: 0.9 % (ref 0.0–3.0)
Eosinophils Absolute: 0.2 10*3/uL (ref 0.0–0.7)
Eosinophils Relative: 2.8 % (ref 0.0–5.0)
HCT: 38.1 % (ref 36.0–46.0)
Hemoglobin: 12.7 g/dL (ref 12.0–15.0)
Lymphocytes Relative: 23.1 % (ref 12.0–46.0)
Lymphs Abs: 1.6 10*3/uL (ref 0.7–4.0)
MCHC: 33.5 g/dL (ref 30.0–36.0)
MCV: 93.4 fl (ref 78.0–100.0)
Monocytes Absolute: 0.8 10*3/uL (ref 0.1–1.0)
Monocytes Relative: 11.1 % (ref 3.0–12.0)
Neutro Abs: 4.3 10*3/uL (ref 1.4–7.7)
Neutrophils Relative %: 62.1 % (ref 43.0–77.0)
Platelets: 279 10*3/uL (ref 150.0–400.0)
RBC: 4.07 Mil/uL (ref 3.87–5.11)
RDW: 14 % (ref 11.5–15.5)
WBC: 6.9 10*3/uL (ref 4.0–10.5)

## 2022-02-17 LAB — URINALYSIS
Bilirubin Urine: NEGATIVE
Hgb urine dipstick: NEGATIVE
Ketones, ur: NEGATIVE
Leukocytes,Ua: NEGATIVE
Nitrite: NEGATIVE
Specific Gravity, Urine: 1.01 (ref 1.000–1.030)
Total Protein, Urine: NEGATIVE
Urine Glucose: NEGATIVE
Urobilinogen, UA: 0.2 (ref 0.0–1.0)
pH: 6.5 (ref 5.0–8.0)

## 2022-02-17 LAB — COMPREHENSIVE METABOLIC PANEL
ALT: 14 U/L (ref 0–35)
AST: 17 U/L (ref 0–37)
Albumin: 4.4 g/dL (ref 3.5–5.2)
Alkaline Phosphatase: 33 U/L — ABNORMAL LOW (ref 39–117)
BUN: 16 mg/dL (ref 6–23)
CO2: 29 mEq/L (ref 19–32)
Calcium: 9.4 mg/dL (ref 8.4–10.5)
Chloride: 100 mEq/L (ref 96–112)
Creatinine, Ser: 0.82 mg/dL (ref 0.40–1.20)
GFR: 66.05 mL/min (ref >60.00)
Glucose, Bld: 85 mg/dL (ref 70–99)
Potassium: 4.9 mEq/L (ref 3.5–5.1)
Sodium: 137 mEq/L (ref 135–145)
Total Bilirubin: 0.4 mg/dL (ref 0.2–1.2)
Total Protein: 7.1 g/dL (ref 6.0–8.3)

## 2022-02-17 LAB — LIPID PANEL
Cholesterol: 186 mg/dL (ref 0–200)
HDL: 59.3 mg/dL (ref >39.00)
LDL Cholesterol: 99 mg/dL (ref 0–99)
NonHDL: 127.03
Total CHOL/HDL Ratio: 3
Triglycerides: 141 mg/dL (ref 0.0–149.0)
VLDL: 28.2 mg/dL (ref 0.0–40.0)

## 2022-02-17 LAB — TSH: TSH: 2.35 u[IU]/mL (ref 0.35–5.50)

## 2022-02-19 DIAGNOSIS — M5451 Vertebrogenic low back pain: Secondary | ICD-10-CM | POA: Diagnosis not present

## 2022-02-22 ENCOUNTER — Ambulatory Visit (INDEPENDENT_AMBULATORY_CARE_PROVIDER_SITE_OTHER): Payer: Medicare Other | Admitting: Internal Medicine

## 2022-02-22 ENCOUNTER — Other Ambulatory Visit: Payer: Self-pay

## 2022-02-22 ENCOUNTER — Encounter: Payer: Self-pay | Admitting: Internal Medicine

## 2022-02-22 DIAGNOSIS — L509 Urticaria, unspecified: Secondary | ICD-10-CM

## 2022-02-22 DIAGNOSIS — L219 Seborrheic dermatitis, unspecified: Secondary | ICD-10-CM

## 2022-02-22 DIAGNOSIS — M545 Low back pain, unspecified: Secondary | ICD-10-CM

## 2022-02-22 DIAGNOSIS — F419 Anxiety disorder, unspecified: Secondary | ICD-10-CM

## 2022-02-22 DIAGNOSIS — G8929 Other chronic pain: Secondary | ICD-10-CM

## 2022-02-22 DIAGNOSIS — D225 Melanocytic nevi of trunk: Secondary | ICD-10-CM | POA: Insufficient documentation

## 2022-02-22 DIAGNOSIS — M542 Cervicalgia: Secondary | ICD-10-CM

## 2022-02-22 DIAGNOSIS — Z85828 Personal history of other malignant neoplasm of skin: Secondary | ICD-10-CM | POA: Insufficient documentation

## 2022-02-22 DIAGNOSIS — R269 Unspecified abnormalities of gait and mobility: Secondary | ICD-10-CM

## 2022-02-22 HISTORY — DX: Melanocytic nevi of trunk: D22.5

## 2022-02-22 HISTORY — DX: Urticaria, unspecified: L50.9

## 2022-02-22 HISTORY — DX: Seborrheic dermatitis, unspecified: L21.9

## 2022-02-22 MED ORDER — METHOCARBAMOL 500 MG PO TABS: 500.0000 mg | ORAL_TABLET | Freq: Every evening | ORAL | 1 refills | Status: DC | PRN

## 2022-02-22 MED ORDER — TRIAMCINOLONE ACETONIDE 0.1 % EX CREA: 1.0000 "application " | TOPICAL_CREAM | Freq: Two times a day (BID) | CUTANEOUS | 1 refills | Status: DC

## 2022-02-22 MED ORDER — TRAMADOL HCL 50 MG PO TABS: 25.0000 mg | ORAL_TABLET | Freq: Four times a day (QID) | ORAL | 1 refills | Status: DC | PRN

## 2022-02-22 MED ORDER — BLUE-EMU MAXIMUM STRENGTH 2.5 % EX LIQD
CUTANEOUS | 3 refills | Status: DC
Start: 2022-02-22 — End: 2022-09-29

## 2022-02-22 NOTE — Assessment & Plan Note (Addendum)
Worse ?Try Blue Emu cream ?[name] went to see Dr Rolena Infante and Dr Nelva Bush - pt had epidurals ?[name] takes Tramadol when pain is bad ?Tramadol as needed.  Rare use - ok to use more. ? Potential benefits of a long term opioids use as well as potential risks (i.e. addiction risk, apnea etc) and complications (i.e. Somnolence, constipation and others) were explained to the patient and were aknowledged. ?Starting dry needling ?Robaxin to consider ?

## 2022-02-22 NOTE — Assessment & Plan Note (Signed)
Chronic  ?Lexapro - not taking ?Weighted blanket ?Lorazepam prn, - tolerance has developed ? Potential benefits of a long term benzodiazepines  use as well as potential risks  and complications were explained to the patient and were aknowledged. ?

## 2022-02-22 NOTE — Patient Instructions (Signed)
Try Blue Emu cream ?

## 2022-02-22 NOTE — Progress Notes (Signed)
? ?Subjective:  ?Patient ID: [name], female    DOB: 1936/02/07  Age: 86 y.o. MRN: [id number] ? ?CC: No chief complaint on file. ? ? ?HPI ?[name] presents for [name] - severe ?[name] went to see Dr Rolena Infante and Dr Nelva Bush - pt had epidurals ?[name] takes Tramadol when pain is bad ? ?10/10 on many days ? ?[name] will see Dr Nelva Bush in May again ? ?Outpatient Medications Prior to Visit  ?Medication Sig Dispense Refill  ? acetaminophen (TYLENOL) 500 MG tablet Take 500 mg by mouth 2 (two) times daily as needed for mild pain.    ? albuterol (VENTOLIN HFA) 108 (90 Base) MCG/ACT inhaler Take 2 puffs every 4-6 hours as needed    ? Azelastine HCl 0.15 % SOLN Place into both nostrils. Place into both nostrils.    ? Calcium Carbonate-Vitamin D (CALCIUM-VITAMIN D) 500-200 MG-UNIT per tablet Take 3 tablets by mouth daily.    ? clobetasol (TEMOVATE) 0.05 % external solution Apply 1 application topically 2 (two) times daily as needed (on scalp). 50 mL 1  ? desonide (DESOWEN) 0.05 % cream Apply 1 application topically as needed.    ? diclofenac Sodium (VOLTAREN) 1 % GEL APPLY 4 GRAMS 4 TIMES DAILY. 200 g 3  ? EPINEPHrine (EPIPEN 2-PAK) 0.3 mg/0.3 mL IJ SOAJ injection Inject 0.3 mLs (0.3 mg total) into the muscle as needed. 1 Device 1  ? estradiol (ESTRACE) 0.1 MG/GM vaginal cream Place 1 gram vaginally 2 x a week at hs 42.5 g 1  ? famotidine (PEPCID) 40 MG tablet Take 1 tablet (40 mg total) by mouth at bedtime. NEEDS OFFICE VISIT FOR ADDITIONAL REFILLS 30 tablet 2  ? gabapentin (NEURONTIN) 100 MG capsule Take 100 mg by mouth at bedtime.    ? ipratropium (ATROVENT) 0.06 % nasal spray Place 1 spray into the nose 3 (three) times daily as needed for rhinitis. 15 mL 0  ? levocetirizine (XYZAL) 5 MG tablet Take 5 mg by mouth every evening.    ? LORazepam (ATIVAN) 1 MG tablet Take 2-3 tablets at bedtime and 1-2 tablets in the daytime as needed. 150 tablet 2  ? montelukast (SINGULAIR) 10 MG tablet Take 1 tablet (10 mg total) by mouth at  bedtime. 90 tablet 1  ? pantoprazole (PROTONIX) 40 MG tablet TAKE (1) TABLET TWICE A DAY BEFORE MEALS. 60 tablet 5  ? polyethylene glycol powder (GLYCOLAX/MIRALAX) 17 GM/SCOOP powder Take 17 g by mouth 2 (two) times daily as needed for moderate constipation. 500 g 3  ? simvastatin (ZOCOR) 40 MG tablet TAKE 1 TABLET ONCE DAILY. 90 tablet 3  ? SYNTHROID 50 MCG tablet TAKE 1 TABLET ONCE DAILY BEFORE BREAKFAST. 30 tablet 5  ? triamcinolone (NASACORT) 55 MCG/ACT AERO nasal inhaler Place 2 sprays into the nose daily.    ? VESICARE 10 MG tablet Take 10 mg by mouth daily as needed (bladder).    ? traMADol (ULTRAM) 50 MG tablet Take by mouth every 6 (six) hours as needed.    ? triamcinolone cream (KENALOG) 0.1 % Apply 1 application topically 2 (two) times daily. APPLY TOPICALLY TWICE A DAY    ? ?No facility-administered medications prior to visit.  ? ? ?ROS: ?Review of Systems  ?Constitutional:  Negative for activity change, appetite change, chills, fatigue and unexpected weight change.  ?HENT:  Negative for congestion, mouth sores and sinus pressure.   ?Eyes:  Negative for visual disturbance.  ?Respiratory:  Negative for cough and chest tightness.   ?  Gastrointestinal:  Negative for abdominal pain and nausea.  ?Genitourinary:  Negative for difficulty urinating, frequency and vaginal pain.  ?Musculoskeletal:  Positive for arthralgias, back pain and gait problem.  ?Skin:  Negative for pallor and rash.  ?Neurological:  Negative for dizziness, tremors, weakness, numbness and headaches.  ?Psychiatric/Behavioral:  Negative for confusion and sleep disturbance.   ? ?Objective:  ?BP 120/70 (BP Location: Left Arm, Patient Position: Sitting, Cuff Size: Large)   Pulse 83   Temp 98.7 ?F (37.1 ?C) (Oral)   Ht 5' (1.524 m)   Wt 114 lb (51.7 kg)   SpO2 94%   BMI 22.26 kg/m?  ? ?BP Readings from Last 3 Encounters:  ?02/22/22 120/70  ?11/18/21 120/68  ?11/12/21 136/74  ? ? ?Wt Readings from Last 3 Encounters:  ?02/22/22 114 lb (51.7 kg)   ?11/18/21 115 lb (52.2 kg)  ?11/12/21 115 lb (52.2 kg)  ? ? ?Physical Exam ?Constitutional:   ?   General: [name] is not in acute distress. ?   Appearance: [name] is well-developed.  ?HENT:  ?   Head: Normocephalic.  ?   Right Ear: External ear normal.  ?   Left Ear: External ear normal.  ?   Nose: Nose normal.  ?Eyes:  ?   General:     ?   Right eye: No discharge.     ?   Left eye: No discharge.  ?   Conjunctiva/sclera: Conjunctivae normal.  ?   Pupils: Pupils are equal, round, and reactive to light.  ?Neck:  ?   Thyroid: No thyromegaly.  ?   Vascular: No JVD.  ?   Trachea: No tracheal deviation.  ?Cardiovascular:  ?   Rate and Rhythm: Normal rate and regular rhythm.  ?   Heart sounds: Normal heart sounds.  ?Pulmonary:  ?   Effort: No respiratory distress.  ?   Breath sounds: No stridor. No wheezing.  ?Abdominal:  ?   General: Bowel sounds are normal. There is no distension.  ?   Palpations: Abdomen is soft. There is no mass.  ?   Tenderness: There is no abdominal tenderness. There is no guarding or rebound.  ?Musculoskeletal:     ?   General: No tenderness.  ?   Cervical back: Normal range of motion and neck supple. No rigidity.  ?Lymphadenopathy:  ?   Cervical: No cervical adenopathy.  ?Skin: ?   Findings: No erythema or rash.  ?Neurological:  ?   Mental Status: [name] is oriented to person, place, and time.  ?   Cranial Nerves: No cranial nerve deficit.  ?   Motor: No abnormal muscle tone.  ?   Coordination: Coordination normal.  ?   Deep Tendon Reflexes: Reflexes normal.  ?Psychiatric:     ?   Behavior: Behavior normal.     ?   Thought Content: Thought content normal.     ?   Judgment: Judgment normal.  ?Spastic  neck , LS muscles ? ?Lab Results  ?Component Value Date  ? WBC 6.9 02/17/2022  ? HGB 12.7 02/17/2022  ? HCT 38.1 02/17/2022  ? PLT 279.0 02/17/2022  ? GLUCOSE 85 02/17/2022  ? CHOL 186 02/17/2022  ? TRIG 141.0 02/17/2022  ? HDL 59.30 02/17/2022  ? LDLDIRECT 98.0 08/13/2021  ? [place] 99 02/17/2022  ? ALT 14  02/17/2022  ? AST 17 02/17/2022  ? NA 137 02/17/2022  ? K 4.9 02/17/2022  ? CL 100 02/17/2022  ? CREATININE 0.82 02/17/2022  ?  BUN 16 02/17/2022  ? CO2 29 02/17/2022  ? TSH 2.35 02/17/2022  ? HGBA1C 5.5 06/13/2020  ? ? ?MM 3D SCREEN BREAST BILATERAL ? ?Result Date: 01/07/2022 ?CLINICAL DATA:  Screening. EXAM: DIGITAL SCREENING BILATERAL MAMMOGRAM WITH TOMOSYNTHESIS AND CAD TECHNIQUE: Bilateral screening digital craniocaudal and mediolateral oblique mammograms were obtained. Bilateral screening digital breast tomosynthesis was performed. The images were evaluated with computer-aided detection. COMPARISON:  Previous exam(s). ACR Breast Density Category b: There are scattered areas of fibroglandular density. FINDINGS: There are no findings suspicious for malignancy. IMPRESSION: No mammographic evidence of malignancy. A result letter of this screening mammogram will be mailed directly to the patient. RECOMMENDATION: Screening mammogram in one year. (Code:SM-B-01Y) BI-RADS CATEGORY  1: Negative. Electronically Signed   By: Lajean Manes M.D.   On: 01/07/2022 10:30  ? ? ?Assessment & Plan:  ? ?Problem List Items Addressed This Visit   ? ? Low back pain  ?  Worse ?Try Blue Emu cream ?Aleayah went to see Dr Rolena Infante and Dr Nelva Bush - pt had epidurals ?Ernestina takes Tramadol when pain is bad ?Tramadol as needed.  Rare use - ok to use more. ? Potential benefits of a long term opioids use as well as potential risks (i.e. addiction risk, apnea etc) and complications (i.e. Somnolence, constipation and others) were explained to the patient and were aknowledged. ?Starting dry needling ?Robaxin to consider ?  ?  ? Relevant Medications  ? methocarbamol (ROBAXIN) 500 MG tablet  ? traMADol (ULTRAM) 50 MG tablet  ? Anxiety  ?  Chronic  ?Lexapro - not taking ?Weighted blanket ?Lorazepam prn, - tolerance has developed ? Potential benefits of a long term benzodiazepines  use as well as potential risks  and complications were explained to the patient  and were aknowledged. ?  ?  ? Neck pain  ?  Tramadol prn ? Potential benefits of a long term opioids use as well as potential risks (i.e. addiction risk, apnea etc) and complications (i.e. Somnolence, constipati

## 2022-02-22 NOTE — Assessment & Plan Note (Signed)
Tramadol prn ° Potential benefits of a long term opioids use as well as potential risks (i.e. addiction risk, apnea etc) and complications (i.e. Somnolence, constipation and others) were explained to the patient and were aknowledged. ° ° °

## 2022-02-24 ENCOUNTER — Ambulatory Visit: Payer: Medicare Other | Admitting: Internal Medicine

## 2022-02-26 NOTE — Progress Notes (Signed)
? ?Office Visit Note ? ?Patient: [name]             ?Date of Birth: 1936-02-24           ?MRN: [id number]             ?PCP: Plotnikov, Evie Lacks, MD ?Referring: Cassandria Anger, MD ?Visit Date: 03/02/2022 ?Occupation: '@GUAROCC'$ @ ? ?Subjective:  ?Lower back pain ? ?History of Present Illness: [name] is a 86 y.o. female with history of arthritis, degenerative disc disease involving cervical and lumbar spine and she states she continues to have lower back pain.  Recently the pain has been radiating into her bilateral lower extremities.  She was referred to Dr. Rolena Infante.  She states Dr. Rolena Infante did x-rays and referred her to Dr. Nelva Bush.  She had epidural injection by Dr. Nelva Bush which relieved her symptoms for about 5 days.  The symptoms have come back.  She is going for physical therapy as well.  She continues to have some discomfort in her trochanteric area.  She denies any discomfort in her hands today.  She had some discomfort on the top of her feet. ? ?Activities of Daily Living:  ?Patient reports morning stiffness for 1 hour.   ?Patient Reports nocturnal pain.  ?Difficulty dressing/grooming: Denies ?Difficulty climbing stairs: Denies ?Difficulty getting out of chair: Denies ?Difficulty using hands for taps, buttons, cutlery, and/or writing: Denies ? ?Review of Systems  ?Constitutional:  Positive for fatigue.  ?HENT:  Negative for mouth sores, mouth dryness and nose dryness.   ?Eyes:  Positive for itching. Negative for pain and dryness.  ?Respiratory:  Negative for shortness of breath and difficulty breathing.   ?Cardiovascular:  Negative for chest pain and palpitations.  ?Gastrointestinal:  Negative for blood in stool, constipation and diarrhea.  ?Endocrine: Negative for increased urination.  ?Genitourinary:  Negative for difficulty urinating.  ?Musculoskeletal:  Positive for joint pain, joint pain, myalgias, morning stiffness, muscle tenderness and myalgias. Negative for joint swelling.  ?Skin:   Negative for color change, rash and redness.  ?Allergic/Immunologic: Negative for susceptible to infections.  ?Neurological:  Negative for dizziness, numbness, headaches, memory loss and weakness.  ?Hematological:  Positive for bruising/bleeding tendency.  ?Psychiatric/Behavioral:  Negative for confusion.   ? ?PMFS History:  ?Patient Active Problem List  ? Diagnosis Date Noted  ? Gait disorder 02/28/2022  ? History of malignant neoplasm of skin 02/22/2022  ? Melanocytic nevi of trunk 02/22/2022  ? Seborrheic dermatitis 02/22/2022  ? Urticaria 02/22/2022  ? Irregular bowel habits 11/18/2021  ? Scalp laceration, sequela 11/02/2021  ? Concussion 10/29/2021  ? Scalp itch 08/19/2021  ? Allergic rhinitis due to animal (cat) (dog) hair and dander 08/07/2021  ? Allergic rhinitis due to pollen 08/07/2021  ? Mild intermittent asthma 08/07/2021  ? Closed fracture of first cervical vertebra ([place]) 08/07/2021  ? Closed fracture of second cervical vertebra ([place]) 08/07/2021  ? Cervical spine instability 05/15/2021  ? Closed 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 05/13/2021  ? Cervical spinal cord compression ([place]) 05/04/2021  ? Fall against object 03/25/2021  ? Hematoma of face, initial encounter 03/25/2021  ? Degenerative disc disease, cervical 03/11/2021  ? Spondylolisthesis, cervical region 03/11/2021  ? Trochanteric bursitis of right hip 09/22/2020  ? Elevated blood-pressure reading, without diagnosis of hypertension 07/14/2020  ? Meningioma ([place]) 03/25/2020  ? Arthralgia 03/18/2020  ? RUQ pain 01/02/2020  ? Cholelithiasis 09/09/2019  ? Ear pain, bilateral 03/21/2019  ? Cervical spondylosis 01/05/2019  ? Neck  pain 12/26/2018  ? Acute pain of left shoulder 12/26/2018  ? Anxiety 08/22/2018  ? Contact dermatitis and eczema 06/14/2018  ? Constipation 11/11/2016  ? Low back pain 11/10/2016  ? Leg abrasion 08/12/2016  ? Contusion of right knee 08/12/2016  ? Sinusitis, chronic 08/06/2016  ? Rash and nonspecific  skin eruption 04/20/2016  ? Aortic valve sclerosis 03/12/2015  ? Contusion of left chest wall 01/09/2015  ? Fatigue 09/08/2014  ? Bladder pain 08/19/2014  ? Increased frequency of urination 08/19/2014  ? Urinary urgency 08/19/2014  ? Postherpetic neuralgia 10/02/2013  ? Impacted cerumen of right ear 07/16/2013  ? Headache 07/10/2013  ? Aortic stenosis 04/19/2013  ? Insomnia 01/24/2013  ? Cough 08/07/2012  ? URI (upper respiratory infection) 08/03/2012  ? Pruritus of skin 03/11/2012  ? MVP (mitral valve prolapse)   ? Multiple thyroid nodules 10/04/2011  ? Herpes zoster 09/07/2011  ? Essential hypertension, benign 07/27/2011  ? Atrophic vaginitis   ? Well adult exam 03/15/2011  ? Hyperlipemia, mixed 03/15/2011  ? OVERACTIVE BLADDER 09/02/2010  ? Hypothyroidism 03/03/2010  ? CALF PAIN, LEFT 10/27/2009  ? Belching 07/21/2009  ? EAR PAIN 12/19/2008  ? Allergic rhinitis 12/19/2008  ? Irritable bowel syndrome 09/04/2008  ? PERSONAL HX COLONIC POLYPS 09/04/2008  ? GASTRITIS, CHRONIC 09/03/2008  ? DUODENITIS, WITHOUT HEMORRHAGE 09/03/2008  ? TIBIALIS TENDINITIS 03/20/2008  ? OSTEOARTHRITIS 02/25/2008  ? SWEATING 02/21/2008  ? LACTOSE INTOLERANCE 11/20/2007  ? Dyslipidemia 11/20/2007  ? GERD 11/20/2007  ? Osteoporosis 11/20/2007  ?  ?Past Medical History:  ?Diagnosis Date  ? Allergic rhinitis   ? Asthma   ? Atrophic vaginitis   ? Baker's cyst   ? Left-Dr. Aluisio  ? Cataract   ? Dr. Katy Fitch  ? CHF (congestive heart failure) ([place])   ? Colon polyps   ? Fibroid   ? Gastritis   ? chronic  ? GERD (gastroesophageal reflux disease)   ? Heart murmur   ? Hemorrhoids   ? Hyperlipidemia   ? Hypothyroidism   ? IBS (irritable bowel syndrome)   ? MVP (mitral valve prolapse)   ? Antibiotics required for dental procedures  ? OA (osteoarthritis)   ? Osteoporosis 06/2018  ? T score -2.2 stable on Prolia  ? Overactive bladder   ? Thyroid disease   ? Thyroid nodule   ? Torn meniscus   ? bilateral  ?  ?Family History  ?Problem Relation Age of Onset   ? Heart disease Mother   ? Hypertension Mother   ? Osteoporosis Mother   ? Congestive Heart Failure Mother   ? Pancreatic cancer Father   ? Heart attack Brother   ? Drug abuse Brother   ?     over dose   ? Healthy Son   ? Healthy Son   ? Colon cancer Neg Hx   ? Colon polyps Neg Hx   ? Rectal cancer Neg Hx   ? Stomach cancer Neg Hx   ? ?Past Surgical History:  ?Procedure Laterality Date  ? CATARACT EXTRACTION, BILATERAL    ? EYE SURGERY    ? POSTERIOR CERVICAL FUSION/FORAMINOTOMY N/A 05/15/2021  ? Procedure: Cervical One Laminectomy with resection of cyst, Fixation from Occiput to Cervical Four;  Surgeon: Erline Levine, MD;  Location: [facility];  Service: Neurosurgery;  Laterality: N/A;  ? TONSILLECTOMY    ? ?Social History  ? ?Social History Narrative  ? Regular exercise-yes  ? Daily caffeine use  ? ?Immunization History  ?Administered Date(s) Administered  ?  Fluad Quad(high Dose 65+) 08/04/2019, 09/16/2021  ? Influenza Whole 09/29/2007, 08/21/2008, 09/02/2010, 07/30/2012  ? Influenza, High Dose Seasonal PF 09/03/2013, 02/19/2015, 11/12/2015, 08/06/2016, 11/11/2016, 08/20/2017, 08/31/2017, 09/20/2018, 09/12/2019  ? Influenza,inj,Quad PF,6+ Mos 12/17/2014, 07/30/2015  ? PFIZER(Purple Top)SARS-COV-2 Vaccination 12/12/2019, 12/31/2019, 07/29/2020, 07/29/2020, 07/31/2020  ? Pneumococcal Conjugate-13 01/09/2014  ? Pneumococcal Polysaccharide-23 09/26/2006, 08/12/2015, 11/11/2016, 08/31/2017, 09/10/2020, 09/10/2021  ? Td 04/14/2010  ? Tdap 06/18/2020  ? Typhoid Inactivated 02/23/2013  ? Zoster, Live 12/09/2006  ?  ? ?Objective: ?Vital Signs: BP 114/74 (BP Location: Left Arm, Patient Position: Sitting, Cuff Size: Normal)   Pulse 88   Ht 5' (1.524 m)   Wt 114 lb 6.4 oz (51.9 kg)   BMI 22.34 kg/m?   ? ?Physical Exam ?Vitals and nursing note reviewed.  ?Constitutional:   ?   Appearance: She is well-developed.  ?HENT:  ?   Head: Normocephalic and atraumatic.  ?Eyes:  ?   Conjunctiva/sclera: Conjunctivae normal.   ?Cardiovascular:  ?   Rate and Rhythm: Normal rate and regular rhythm.  ?   Heart sounds: Normal heart sounds.  ?Pulmonary:  ?   Effort: Pulmonary effort is normal.  ?   Breath sounds: Normal breath sounds.  ?Abdominal:  ?

## 2022-02-28 DIAGNOSIS — R269 Unspecified abnormalities of gait and mobility: Secondary | ICD-10-CM

## 2022-02-28 HISTORY — DX: Unspecified abnormalities of gait and mobility: R26.9

## 2022-02-28 NOTE — Assessment & Plan Note (Signed)
Worse ?Multifactorial ?Cont w/PT ?

## 2022-03-02 ENCOUNTER — Ambulatory Visit (INDEPENDENT_AMBULATORY_CARE_PROVIDER_SITE_OTHER): Payer: Medicare Other | Admitting: Rheumatology

## 2022-03-02 ENCOUNTER — Encounter: Payer: Self-pay | Admitting: Rheumatology

## 2022-03-02 VITALS — BP 114/74 | HR 88 | Ht 60.0 in | Wt 114.4 lb

## 2022-03-02 DIAGNOSIS — I341 Nonrheumatic mitral (valve) prolapse: Secondary | ICD-10-CM | POA: Diagnosis not present

## 2022-03-02 DIAGNOSIS — M19072 Primary osteoarthritis, left ankle and foot: Secondary | ICD-10-CM

## 2022-03-02 DIAGNOSIS — Z8719 Personal history of other diseases of the digestive system: Secondary | ICD-10-CM

## 2022-03-02 DIAGNOSIS — G8929 Other chronic pain: Secondary | ICD-10-CM

## 2022-03-02 DIAGNOSIS — M5136 Other intervertebral disc degeneration, lumbar region: Secondary | ICD-10-CM | POA: Diagnosis not present

## 2022-03-02 DIAGNOSIS — M7062 Trochanteric bursitis, left hip: Secondary | ICD-10-CM

## 2022-03-02 DIAGNOSIS — M19041 Primary osteoarthritis, right hand: Secondary | ICD-10-CM

## 2022-03-02 DIAGNOSIS — K8020 Calculus of gallbladder without cholecystitis without obstruction: Secondary | ICD-10-CM

## 2022-03-02 DIAGNOSIS — E034 Atrophy of thyroid (acquired): Secondary | ICD-10-CM

## 2022-03-02 DIAGNOSIS — M5030 Other cervical disc degeneration, unspecified cervical region: Secondary | ICD-10-CM | POA: Diagnosis not present

## 2022-03-02 DIAGNOSIS — M7061 Trochanteric bursitis, right hip: Secondary | ICD-10-CM

## 2022-03-02 DIAGNOSIS — Z8639 Personal history of other endocrine, nutritional and metabolic disease: Secondary | ICD-10-CM

## 2022-03-02 DIAGNOSIS — I10 Essential (primary) hypertension: Secondary | ICD-10-CM | POA: Diagnosis not present

## 2022-03-02 DIAGNOSIS — E042 Nontoxic multinodular goiter: Secondary | ICD-10-CM

## 2022-03-02 DIAGNOSIS — M19042 Primary osteoarthritis, left hand: Secondary | ICD-10-CM

## 2022-03-02 DIAGNOSIS — Z86010 Personal history of colonic polyps: Secondary | ICD-10-CM

## 2022-03-02 DIAGNOSIS — M810 Age-related osteoporosis without current pathological fracture: Secondary | ICD-10-CM | POA: Diagnosis not present

## 2022-03-02 DIAGNOSIS — M532X1 Spinal instabilities, occipito-atlanto-axial region: Secondary | ICD-10-CM

## 2022-03-02 DIAGNOSIS — I358 Other nonrheumatic aortic valve disorders: Secondary | ICD-10-CM

## 2022-03-02 DIAGNOSIS — M19071 Primary osteoarthritis, right ankle and foot: Secondary | ICD-10-CM

## 2022-03-02 MED ORDER — TRIAMCINOLONE ACETONIDE 40 MG/ML IJ SUSP
40.0000 mg | INTRAMUSCULAR | Status: AC | PRN
  Administered 2022-03-02: 40 mg via INTRA_ARTICULAR

## 2022-03-02 MED ORDER — LIDOCAINE HCL 1 % IJ SOLN
1.5000 mL | INTRAMUSCULAR | Status: AC | PRN
  Administered 2022-03-02: 1.5 mL

## 2022-03-10 ENCOUNTER — Telehealth: Payer: Self-pay | Admitting: *Deleted

## 2022-03-10 DIAGNOSIS — M542 Cervicalgia: Secondary | ICD-10-CM | POA: Diagnosis not present

## 2022-03-10 NOTE — Telephone Encounter (Signed)
Rec'd PA for pt synthroid 50 mg... Proceeded to do in cover-my-meds w/ Key: [id number]. Rec;d msg's stating " Drug is covered by current benefit plan. No further PA activity needed".Marland KitchenJohny Chess ?

## 2022-03-14 ENCOUNTER — Other Ambulatory Visit: Payer: Self-pay | Admitting: Internal Medicine

## 2022-03-15 DIAGNOSIS — M542 Cervicalgia: Secondary | ICD-10-CM | POA: Diagnosis not present

## 2022-03-24 DIAGNOSIS — M542 Cervicalgia: Secondary | ICD-10-CM | POA: Diagnosis not present

## 2022-03-26 DIAGNOSIS — M542 Cervicalgia: Secondary | ICD-10-CM | POA: Diagnosis not present

## 2022-03-29 ENCOUNTER — Encounter: Payer: Self-pay | Admitting: Internal Medicine

## 2022-03-29 ENCOUNTER — Ambulatory Visit (INDEPENDENT_AMBULATORY_CARE_PROVIDER_SITE_OTHER): Payer: Medicare Other | Admitting: Internal Medicine

## 2022-03-29 DIAGNOSIS — R3989 Other symptoms and signs involving the genitourinary system: Secondary | ICD-10-CM

## 2022-03-29 DIAGNOSIS — E785 Hyperlipidemia, unspecified: Secondary | ICD-10-CM

## 2022-03-29 DIAGNOSIS — R269 Unspecified abnormalities of gait and mobility: Secondary | ICD-10-CM

## 2022-03-29 DIAGNOSIS — F419 Anxiety disorder, unspecified: Secondary | ICD-10-CM | POA: Diagnosis not present

## 2022-03-29 DIAGNOSIS — R5382 Chronic fatigue, unspecified: Secondary | ICD-10-CM

## 2022-03-29 DIAGNOSIS — E034 Atrophy of thyroid (acquired): Secondary | ICD-10-CM

## 2022-03-29 DIAGNOSIS — M1990 Unspecified osteoarthritis, unspecified site: Secondary | ICD-10-CM

## 2022-03-29 LAB — URINALYSIS, ROUTINE W REFLEX MICROSCOPIC
Bilirubin Urine: NEGATIVE
Hgb urine dipstick: NEGATIVE
Ketones, ur: NEGATIVE
Nitrite: NEGATIVE
RBC / HPF: NONE SEEN
Specific Gravity, Urine: 1.015 (ref 1.000–1.030)
Total Protein, Urine: NEGATIVE
Urine Glucose: NEGATIVE
Urobilinogen, UA: 0.2 (ref 0.0–1.0)
pH: 5.5 (ref 5.0–8.0)

## 2022-03-29 MED ORDER — POLYETHYLENE GLYCOL 3350 17 GM/SCOOP PO POWD: 17.0000 g | Freq: Two times a day (BID) | ORAL | 3 refills | Status: AC | PRN

## 2022-03-29 MED ORDER — VESICARE 10 MG PO TABS: 10.0000 mg | ORAL_TABLET | Freq: Every day | ORAL | 3 refills | Status: DC | PRN

## 2022-03-29 NOTE — Assessment & Plan Note (Signed)
In the balance class ?

## 2022-03-29 NOTE — Assessment & Plan Note (Signed)
Rest more ?

## 2022-03-29 NOTE — Assessment & Plan Note (Signed)
Blue-Emu cream was recommended to use 2-3 times a day ? ?

## 2022-03-29 NOTE — Assessment & Plan Note (Signed)
Check UA 

## 2022-03-29 NOTE — Assessment & Plan Note (Signed)
Cont on Lorazepam prn, - tolerance has developed ? Potential benefits of a long term benzodiazepines  use as well as potential risks  and complications were explained to the patient and were aknowledged. ?

## 2022-03-29 NOTE — Progress Notes (Signed)
Subjective:  Patient ID: [name], female    DOB: 05-Aug-1936  Age: 86 y.o. MRN: [id number]  CC: No chief complaint on file.   HPI HELENA SARDO presents for HTN, anxiety, GERD, hypothyroidism  Outpatient Medications Prior to Visit  Medication Sig Dispense Refill   acetaminophen (TYLENOL) 500 MG tablet Take 500 mg by mouth 2 (two) times daily as needed for mild pain.     albuterol (VENTOLIN HFA) 108 (90 Base) MCG/ACT inhaler Take 2 puffs every 4-6 hours as needed     Azelastine HCl 0.15 % SOLN Place into both nostrils. Place into both nostrils.     Calcium Carbonate-Vitamin D (CALCIUM-VITAMIN D) 500-200 MG-UNIT per tablet Take 3 tablets by mouth daily.     clobetasol (TEMOVATE) 0.05 % external solution Apply 1 application topically 2 (two) times daily as needed (on scalp). 50 mL 1   desonide (DESOWEN) 0.05 % cream Apply 1 application topically as needed.     diclofenac Sodium (VOLTAREN) 1 % GEL APPLY 4 GRAMS 4 TIMES DAILY. 200 g 3   EPINEPHrine (EPIPEN 2-PAK) 0.3 mg/0.3 mL IJ SOAJ injection Inject 0.3 mLs (0.3 mg total) into the muscle as needed. 1 Device 1   estradiol (ESTRACE) 0.1 MG/GM vaginal cream Place 1 gram vaginally 2 x a week at hs 42.5 g 1   famotidine (PEPCID) 40 MG tablet Take 1 tablet (40 mg total) by mouth at bedtime. NEEDS OFFICE VISIT FOR ADDITIONAL REFILLS 30 tablet 2   gabapentin (NEURONTIN) 100 MG capsule Take 100 mg by mouth as needed.     ipratropium (ATROVENT) 0.06 % nasal spray Place 1 spray into the nose 3 (three) times daily as needed for rhinitis. 15 mL 0   levocetirizine (XYZAL) 5 MG tablet Take 5 mg by mouth every evening.     levothyroxine (SYNTHROID) 50 MCG tablet TAKE ONE TABLET BY MOUTH DAILY BEFORE BREAKFAST. 30 tablet 5   LORazepam (ATIVAN) 1 MG tablet Take 2-3 tablets at bedtime and 1-2 tablets in the daytime as needed. 150 tablet 2   methocarbamol (ROBAXIN) 500 MG tablet Take 1 tablet (500 mg total) by mouth at bedtime as needed for muscle spasms.  30 tablet 1   montelukast (SINGULAIR) 10 MG tablet Take 1 tablet (10 mg total) by mouth at bedtime. 90 tablet 1   pantoprazole (PROTONIX) 40 MG tablet TAKE (1) TABLET TWICE A DAY BEFORE MEALS. (Patient taking differently: as needed.) 60 tablet 5   simvastatin (ZOCOR) 40 MG tablet TAKE 1 TABLET ONCE DAILY. 90 tablet 3   traMADol (ULTRAM) 50 MG tablet Take 0.5-1 tablets (25-50 mg total) by mouth every 6 (six) hours as needed for severe pain. 120 tablet 1   triamcinolone (NASACORT) 55 MCG/ACT AERO nasal inhaler Place 2 sprays into the nose daily.     triamcinolone cream (KENALOG) 0.1 % Apply 1 application. topically 2 (two) times daily. APPLY TOPICALLY TWICE A DAY 45 g 1   polyethylene glycol powder (GLYCOLAX/MIRALAX) 17 GM/SCOOP powder Take 17 g by mouth 2 (two) times daily as needed for moderate constipation. 500 g 3   VESICARE 10 MG tablet Take 10 mg by mouth daily as needed (bladder).     BLUE-EMU MAXIMUM STRENGTH 2.5 % LIQD Use bid (Patient not taking: Reported on 03/02/2022) 60 mL 3   No facility-administered medications prior to visit.    ROS: Review of Systems  Constitutional:  Positive for fatigue. Negative for activity change, appetite change, chills and unexpected weight change.  HENT:  Negative for congestion, mouth sores and sinus pressure.   Eyes:  Negative for visual disturbance.  Respiratory:  Negative for cough and chest tightness.   Gastrointestinal:  Negative for abdominal pain and nausea.  Genitourinary:  Negative for difficulty urinating, frequency and vaginal pain.  Musculoskeletal:  Positive for arthralgias and back pain. Negative for gait problem.  Skin:  Negative for pallor and rash.  Neurological:  Negative for dizziness, tremors, weakness, numbness and headaches.  Psychiatric/Behavioral:  Negative for confusion, sleep disturbance and suicidal ideas. The patient is nervous/anxious.    Objective:  BP 120/62 (BP Location: Left Arm, Patient Position: Sitting, Cuff Size:  Normal)   Pulse 86   Temp 97.9 F (36.6 C) (Oral)   Ht 5' (1.524 m)   Wt 111 lb (50.3 kg)   SpO2 95%   BMI 21.68 kg/m   BP Readings from Last 3 Encounters:  03/29/22 120/62  03/02/22 114/74  02/22/22 120/70    Wt Readings from Last 3 Encounters:  03/29/22 111 lb (50.3 kg)  03/02/22 114 lb 6.4 oz (51.9 kg)  02/22/22 114 lb (51.7 kg)    Physical Exam Constitutional:      General: She is not in acute distress.    Appearance: She is well-developed.  HENT:     Head: Normocephalic.     Right Ear: External ear normal.     Left Ear: External ear normal.     Nose: Nose normal.  Eyes:     General:        Right eye: No discharge.        Left eye: No discharge.     Conjunctiva/sclera: Conjunctivae normal.     Pupils: Pupils are equal, round, and reactive to light.  Neck:     Thyroid: No thyromegaly.     Vascular: No JVD.     Trachea: No tracheal deviation.  Cardiovascular:     Rate and Rhythm: Normal rate and regular rhythm.     Heart sounds: Normal heart sounds.  Pulmonary:     Effort: No respiratory distress.     Breath sounds: No stridor. No wheezing.  Abdominal:     General: Bowel sounds are normal. There is no distension.     Palpations: Abdomen is soft. There is no mass.     Tenderness: There is no abdominal tenderness. There is no guarding or rebound.  Musculoskeletal:        General: No tenderness.     Cervical back: Normal range of motion and neck supple. No rigidity.  Lymphadenopathy:     Cervical: No cervical adenopathy.  Skin:    Findings: No erythema or rash.  Neurological:     Mental Status: She is oriented to person, place, and time.     Cranial Nerves: No cranial nerve deficit.     Motor: No abnormal muscle tone.     Coordination: Coordination normal.     Deep Tendon Reflexes: Reflexes normal.  Psychiatric:        Behavior: Behavior normal.        Thought Content: Thought content normal.        Judgment: Judgment normal.    Lab Results   Component Value Date   WBC 6.9 02/17/2022   HGB 12.7 02/17/2022   HCT 38.1 02/17/2022   PLT 279.0 02/17/2022   GLUCOSE 85 02/17/2022   CHOL 186 02/17/2022   TRIG 141.0 02/17/2022   HDL 59.30 02/17/2022   LDLDIRECT 98.0 08/13/2021   LDLCALC  99 02/17/2022   ALT 14 02/17/2022   AST 17 02/17/2022   NA 137 02/17/2022   K 4.9 02/17/2022   CL 100 02/17/2022   CREATININE 0.82 02/17/2022   BUN 16 02/17/2022   CO2 29 02/17/2022   TSH 2.35 02/17/2022   HGBA1C 5.5 06/13/2020    MM 3D SCREEN BREAST BILATERAL  Result Date: 01/07/2022 CLINICAL DATA:  Screening. EXAM: DIGITAL SCREENING BILATERAL MAMMOGRAM WITH TOMOSYNTHESIS AND CAD TECHNIQUE: Bilateral screening digital craniocaudal and mediolateral oblique mammograms were obtained. Bilateral screening digital breast tomosynthesis was performed. The images were evaluated with computer-aided detection. COMPARISON:  Previous exam(s). ACR Breast Density Category b: There are scattered areas of fibroglandular density. FINDINGS: There are no findings suspicious for malignancy. IMPRESSION: No mammographic evidence of malignancy. A result letter of this screening mammogram will be mailed directly to the patient. RECOMMENDATION: Screening mammogram in one year. (Code:SM-B-01Y) BI-RADS CATEGORY  1: Negative. Electronically Signed   By: Lajean Manes M.D.   On: 01/07/2022 10:30    Assessment & Plan:   Problem List Items Addressed This Visit     Hypothyroidism    On Levothroid brand      Dyslipidemia    Cont on Simvastatin       Osteoarthritis    Blue-Emu cream was recommended to use 2-3 times a day        Fatigue    Rest more       Anxiety    Cont on Lorazepam prn, - tolerance has developed  Potential benefits of a long term benzodiazepines  use as well as potential risks  and complications were explained to the patient and were aknowledged.       Bladder pain    Check UA       Relevant Orders   Urinalysis   Gait disorder     In the balance class          Meds ordered this encounter  Medications   VESICARE 10 MG tablet    Sig: Take 1 tablet (10 mg total) by mouth daily as needed (bladder).    Dispense:  90 tablet    Refill:  3   polyethylene glycol powder (GLYCOLAX/MIRALAX) 17 GM/SCOOP powder    Sig: Take 17 g by mouth 2 (two) times daily as needed for moderate constipation.    Dispense:  500 g    Refill:  3      Follow-up: Return in about 3 months (around 06/29/2022) for a follow-up visit.  Walker Kehr, MD

## 2022-03-29 NOTE — Assessment & Plan Note (Signed)
Cont on Simvastatin 

## 2022-03-29 NOTE — Assessment & Plan Note (Signed)
On Levothroid brand 

## 2022-03-30 DIAGNOSIS — M542 Cervicalgia: Secondary | ICD-10-CM | POA: Diagnosis not present

## 2022-04-05 DIAGNOSIS — M47816 Spondylosis without myelopathy or radiculopathy, lumbar region: Secondary | ICD-10-CM | POA: Diagnosis not present

## 2022-04-07 DIAGNOSIS — M542 Cervicalgia: Secondary | ICD-10-CM | POA: Diagnosis not present

## 2022-04-10 DIAGNOSIS — M47816 Spondylosis without myelopathy or radiculopathy, lumbar region: Secondary | ICD-10-CM | POA: Diagnosis not present

## 2022-04-13 DIAGNOSIS — M542 Cervicalgia: Secondary | ICD-10-CM | POA: Diagnosis not present

## 2022-04-14 ENCOUNTER — Encounter: Payer: Self-pay | Admitting: Internal Medicine

## 2022-04-27 DIAGNOSIS — M542 Cervicalgia: Secondary | ICD-10-CM | POA: Diagnosis not present

## 2022-04-29 DIAGNOSIS — M47816 Spondylosis without myelopathy or radiculopathy, lumbar region: Secondary | ICD-10-CM | POA: Diagnosis not present

## 2022-04-29 NOTE — Progress Notes (Signed)
Office Visit Note  Patient: [name]             Date of Birth: Apr 18, 1936           MRN: [id number]             PCP: Cassandria Anger, MD Referring: Cassandria Anger, MD Visit Date: 05/12/2022 Occupation: '@GUAROCC'$ @  Subjective:  Left hip pain  History of Present Illness: [name] is a 86 y.o. female history of osteoarthritis and degenerative disc disease.  She states she had an appointment with the neurosurgeon this morning.  She has limited range of motion of her cervical spine but she has been doing well.  She continues to have some lower back discomfort.  She states she was evaluated by Dr. Rolena Infante in had an injection by Dr. Nelva Bush without much relief.  She states that Dr. Nelva Bush suggested radiofrequency ablation but at this time her pain is manageable with medications.  She has been having discomfort in her right trochanteric area for the last few months.  She states the pain is not improved.  The pain has been radiating to her right thigh.  She also has nocturnal discomfort.  She had good response to a cortisone injection in the past.  She is interested in getting a repeat cortisone injection.  Activities of Daily Living:  Patient reports morning stiffness for 15 minutes.   Patient Reports nocturnal pain.  Difficulty dressing/grooming: Denies Difficulty climbing stairs: Denies Difficulty getting out of chair: Denies Difficulty using hands for taps, buttons, cutlery, and/or writing: Denies  Review of Systems  Constitutional:  Positive for fatigue.  HENT:  Positive for mouth dryness.   Eyes:  Negative for dryness.  Respiratory:  Negative for shortness of breath.   Cardiovascular:  Negative for swelling in legs/feet.  Gastrointestinal:  Negative for constipation.  Endocrine: Positive for increased urination.  Genitourinary:  Negative for difficulty urinating.  Musculoskeletal:  Positive for joint pain, gait problem, joint pain, muscle weakness and morning stiffness.   Skin:  Negative for rash.  Allergic/Immunologic: Negative for susceptible to infections.  Neurological:  Negative for numbness.  Hematological:  Positive for bruising/bleeding tendency.  Psychiatric/Behavioral:  Negative for sleep disturbance.     PMFS History:  Patient Active Problem List   Diagnosis Date Noted   Gait disorder 02/28/2022   History of malignant neoplasm of skin 02/22/2022   Melanocytic nevi of trunk 02/22/2022   Seborrheic dermatitis 02/22/2022   Urticaria 02/22/2022   Irregular bowel habits 11/18/2021   Scalp laceration, sequela 11/02/2021   Concussion 10/29/2021   Scalp itch 08/19/2021   Allergic rhinitis due to animal (cat) (dog) hair and dander 08/07/2021   Allergic rhinitis due to pollen 08/07/2021   Mild intermittent asthma 08/07/2021   Closed fracture of first cervical vertebra ([place]) 08/07/2021   Closed fracture of second cervical vertebra ([place]) 08/07/2021   Cervical spine instability 05/15/2021   Closed 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 05/13/2021   Cervical spinal cord compression ([place]) 05/04/2021   Fall against object 03/25/2021   Hematoma of face, initial encounter 03/25/2021   Degenerative disc disease, cervical 03/11/2021   Spondylolisthesis, cervical region 03/11/2021   Trochanteric bursitis of right hip 09/22/2020   Elevated blood-pressure reading, without diagnosis of hypertension 07/14/2020   Meningioma ([place]) 03/25/2020   Arthralgia 03/18/2020   RUQ pain 01/02/2020   Cholelithiasis 09/09/2019   Ear pain, bilateral 03/21/2019   Cervical spondylosis 01/05/2019   Neck pain 12/26/2018  Acute pain of left shoulder 12/26/2018   Anxiety 08/22/2018   Contact dermatitis and eczema 06/14/2018   Constipation 11/11/2016   Low back pain 11/10/2016   Leg abrasion 08/12/2016   Contusion of right knee 08/12/2016   Sinusitis, chronic 08/06/2016   Rash and nonspecific skin eruption 04/20/2016   Aortic valve sclerosis  03/12/2015   Contusion of left chest wall 01/09/2015   Fatigue 09/08/2014   Bladder pain 08/19/2014   Increased frequency of urination 08/19/2014   Urinary urgency 08/19/2014   Postherpetic neuralgia 10/02/2013   Impacted cerumen of right ear 07/16/2013   Headache 07/10/2013   Aortic stenosis 04/19/2013   Insomnia 01/24/2013   Cough 08/07/2012   URI (upper respiratory infection) 08/03/2012   Pruritus of skin 03/11/2012   MVP (mitral valve prolapse)    Multiple thyroid nodules 10/04/2011   Herpes zoster 09/07/2011   Essential hypertension, benign 07/27/2011   Atrophic vaginitis    Well adult exam 03/15/2011   Hyperlipemia, mixed 03/15/2011   OVERACTIVE BLADDER 09/02/2010   Hypothyroidism 03/03/2010   CALF PAIN, LEFT 10/27/2009   Belching 07/21/2009   EAR PAIN 12/19/2008   Allergic rhinitis 12/19/2008   Irritable bowel syndrome 09/04/2008   PERSONAL HX COLONIC POLYPS 09/04/2008   GASTRITIS, CHRONIC 09/03/2008   DUODENITIS, WITHOUT HEMORRHAGE 09/03/2008   TIBIALIS TENDINITIS 03/20/2008   Osteoarthritis 02/25/2008   SWEATING 02/21/2008   LACTOSE INTOLERANCE 11/20/2007   Dyslipidemia 11/20/2007   GERD 11/20/2007   Osteoporosis 11/20/2007    Past Medical History:  Diagnosis Date   Allergic rhinitis    Asthma    Atrophic vaginitis    Baker's cyst    Left-Dr. Aluisio   Cataract    Dr. Katy Fitch   CHF (congestive heart failure) (HCC)    Colon polyps    Fibroid    Gastritis    chronic   GERD (gastroesophageal reflux disease)    Heart murmur    Hemorrhoids    Hyperlipidemia    Hypothyroidism    IBS (irritable bowel syndrome)    MVP (mitral valve prolapse)    Antibiotics required for dental procedures   OA (osteoarthritis)    Osteoporosis 06/2018   T score -2.2 stable on Prolia   Overactive bladder    Thyroid disease    Thyroid nodule    Torn meniscus    bilateral    Family History  Problem Relation Age of Onset   Heart disease Mother    Hypertension Mother     Osteoporosis Mother    Congestive Heart Failure Mother    Pancreatic cancer Father    Heart attack Brother    Drug abuse Brother        over dose    Healthy Son    Healthy Son    Colon cancer Neg Hx    Colon polyps Neg Hx    Rectal cancer Neg Hx    Stomach cancer Neg Hx    Past Surgical History:  Procedure Laterality Date   CATARACT EXTRACTION, BILATERAL     EYE SURGERY     POSTERIOR CERVICAL FUSION/FORAMINOTOMY N/A 05/15/2021   Procedure: Cervical One Laminectomy with resection of cyst, Fixation from Occiput to Cervical Four;  Surgeon: Erline Levine, MD;  Location: [facility];  Service: Neurosurgery;  Laterality: N/A;   TONSILLECTOMY     Social History   Social History Narrative   Regular exercise-yes   Daily caffeine use   Immunization History  Administered Date(s) Administered   Fluad Quad(high Dose 65+)  08/04/2019, 09/16/2021   Influenza Whole 09/29/2007, 08/21/2008, 09/02/2010, 07/30/2012   Influenza, High Dose Seasonal PF 09/03/2013, 02/19/2015, 11/12/2015, 08/06/2016, 11/11/2016, 08/20/2017, 08/31/2017, 09/20/2018, 09/12/2019   Influenza,inj,Quad PF,6+ Mos 12/17/2014, 07/30/2015   PFIZER(Purple Top)SARS-COV-2 Vaccination 12/12/2019, 12/31/2019, 07/29/2020, 07/29/2020, 07/31/2020   Pneumococcal Conjugate-13 01/09/2014   Pneumococcal Polysaccharide-23 09/26/2006, 08/12/2015, 11/11/2016, 08/31/2017, 09/10/2020, 09/10/2021   Td 04/14/2010   Tdap 06/18/2020   Typhoid Inactivated 02/23/2013   Zoster, Live 12/09/2006     Objective: Vital Signs: BP 124/76 (BP Location: Left Arm, Patient Position: Sitting, Cuff Size: Normal)   Pulse 77   Resp 16   Ht 5' (1.524 m)   Wt 112 lb (50.8 kg)   BMI 21.87 kg/m    Physical Exam Vitals and nursing note reviewed.  Constitutional:      Appearance: She is well-developed.  HENT:     Head: Normocephalic and atraumatic.  Eyes:     Conjunctiva/sclera: Conjunctivae normal.  Cardiovascular:     Rate and Rhythm: Normal rate and  regular rhythm.     Heart sounds: Normal heart sounds.  Pulmonary:     Effort: Pulmonary effort is normal.     Breath sounds: Normal breath sounds.  Abdominal:     General: Bowel sounds are normal.     Palpations: Abdomen is soft.  Musculoskeletal:     Cervical back: Normal range of motion.  Lymphadenopathy:     Cervical: No cervical adenopathy.  Skin:    General: Skin is warm and dry.     Capillary Refill: Capillary refill takes less than 2 seconds.  Neurological:     Mental Status: She is alert and oriented to person, place, and time.  Psychiatric:        Behavior: Behavior normal.      Musculoskeletal Exam: She had very limited range of motion of her cervical spine with lateral rotation.  She had discomfort range of motion of her lumbar spine.  Thoracic kyphosis was noted.  Shoulder joints, elbow joints, wrist joints with good range of motion.  She had bilateral PIP and DIP thickening.  Hip joints with good range of motion.  She had tenderness over right trochanteric bursa.  There was no tenderness on palpation of bilateral knee joints, ankles or MTPs.  No synovitis was noted.  CDAI Exam: CDAI Score: -- Patient Global: --; Provider Global: -- Swollen: --; Tender: -- Joint Exam 05/12/2022   No joint exam has been documented for this visit   There is currently no information documented on the homunculus. Go to the Rheumatology activity and complete the homunculus joint exam.  Investigation: No additional findings.  Imaging: No results found.  Recent Labs: Lab Results  Component Value Date   WBC 6.9 02/17/2022   HGB 12.7 02/17/2022   PLT 279.0 02/17/2022   NA 137 02/17/2022   K 4.9 02/17/2022   CL 100 02/17/2022   CO2 29 02/17/2022   GLUCOSE 85 02/17/2022   BUN 16 02/17/2022   CREATININE 0.82 02/17/2022   BILITOT 0.4 02/17/2022   ALKPHOS 33 (L) 02/17/2022   AST 17 02/17/2022   ALT 14 02/17/2022   PROT 7.1 02/17/2022   ALBUMIN 4.4 02/17/2022   CALCIUM 9.4  02/17/2022   GFRAA 87 07/17/2020    Speciality Comments: No specialty comments available.  Procedures:  Large Joint Inj: R greater trochanter on 05/12/2022 1:33 PM Indications: pain Details: 27 G 1.5 in needle, lateral approach  Arthrogram: No  Medications: 1.5 mL lidocaine 1 %; 40 mg triamcinolone  acetonide 40 MG/ML Aspirate: 0 mL Outcome: tolerated well, no immediate complications Procedure, treatment alternatives, risks and benefits explained, specific risks discussed. Consent was given by the patient. Immediately prior to procedure a time out was called to verify the correct patient, procedure, equipment, support staff and site/side marked as required. Patient was prepped and draped in the usual sterile fashion.     Allergies: Bee venom, Bacitracin-polymyxin b, Clarithromycin, Doxycycline, Neosporin [neomycin-bacitracin zn-polymyx], and Hydrocortisone   Assessment / Plan:     Visit Diagnoses: DDD (degenerative disc disease), cervical - S/p fusion for C1-C2 instability on June 14, 2021 by Dr. Vertell Limber.  She is followed by Dr. Reatha Armour now.  She has limited range of motion but not much discomfort.  DDD (degenerative disc disease), lumbar-she continues to have back discomfort.  She had an inadequate response to cortisone injection.  She is trying medications for now.  Trochanteric bursitis of left hip-she was having pain and discomfort over the right trochanteric region.  She is describes nocturnal pain and discomfort walking.  She states the pain radiates into the right lower extremity.  She had good response to cortisone injection in the past.  She requested repeat cortisone injection.  After informed consent was obtained and side effects were discussed the right trochanteric bursa was injected with lidocaine and cortisone as described above.  She tolerated the procedure well.  Postprocedure instructions were given.  Primary osteoarthritis of both hands - U/s negative for synovitis.   autoimmune workup negative.  She had bilateral PIP and DIP thickening consistent with osteoarthritis.  Joint protection was discussed.  Primary osteoarthritis of both feet-she has severe osteoarthritis in her feet with hammertoes.  Proper fitting shoes with arch support were advised.  I discussed possible use of braces for her toes.  Age-related osteoporosis without current pathological fracture - She is on Prolia by her GYN.   Essential hypertension, benign-her blood pressure was normal today.  Other medical problems are listed as follows:  MVP (mitral valve prolapse)  History of gastroesophageal reflux (GERD)  Aortic valve sclerosis  History of IBS  History of hyperlipidemia  Hypothyroidism due to acquired atrophy of thyroid  Hx of colonic polyps  Orders: Orders Placed This Encounter  Procedures   Large Joint Inj   No orders of the defined types were placed in this encounter.    Follow-Up Instructions: Return in about 6 months (around 11/11/2022) for Osteoarthritis, Osteoporosis.   Bo Merino, MD  Note - This record has been created using Editor, commissioning.  Chart creation errors have been sought, but may not always  have been located. Such creation errors do not reflect on  the standard of medical care.

## 2022-04-30 ENCOUNTER — Telehealth: Payer: Self-pay | Admitting: *Deleted

## 2022-04-30 NOTE — Telephone Encounter (Signed)
Deductible $226 ($260mt)  OOP MAX n/a  Annual exam 08/07/2021  Calcium   9.4          Date 02/17/2022  Upcoming dental procedures no  Prior Authorization needed no  Pt estimated Cost $0  Appt 05/19/2022     Coverage Details:: For the Secondary MD Purchase access option, this is a Medicare Supplement Plan J and it covers the Medicare Part B deductible, co-insurance and 100% of the excess charges.

## 2022-05-04 ENCOUNTER — Encounter: Payer: Self-pay | Admitting: Internal Medicine

## 2022-05-09 DIAGNOSIS — G894 Chronic pain syndrome: Secondary | ICD-10-CM | POA: Diagnosis not present

## 2022-05-12 ENCOUNTER — Encounter: Payer: Self-pay | Admitting: Rheumatology

## 2022-05-12 ENCOUNTER — Ambulatory Visit (INDEPENDENT_AMBULATORY_CARE_PROVIDER_SITE_OTHER): Payer: Medicare Other | Admitting: Rheumatology

## 2022-05-12 VITALS — BP 124/76 | HR 77 | Resp 16 | Ht 60.0 in | Wt 112.0 lb

## 2022-05-12 DIAGNOSIS — I358 Other nonrheumatic aortic valve disorders: Secondary | ICD-10-CM

## 2022-05-12 DIAGNOSIS — M19072 Primary osteoarthritis, left ankle and foot: Secondary | ICD-10-CM

## 2022-05-12 DIAGNOSIS — E034 Atrophy of thyroid (acquired): Secondary | ICD-10-CM

## 2022-05-12 DIAGNOSIS — Z8719 Personal history of other diseases of the digestive system: Secondary | ICD-10-CM | POA: Diagnosis not present

## 2022-05-12 DIAGNOSIS — Z8639 Personal history of other endocrine, nutritional and metabolic disease: Secondary | ICD-10-CM | POA: Diagnosis not present

## 2022-05-12 DIAGNOSIS — I10 Essential (primary) hypertension: Secondary | ICD-10-CM

## 2022-05-12 DIAGNOSIS — M19041 Primary osteoarthritis, right hand: Secondary | ICD-10-CM

## 2022-05-12 DIAGNOSIS — M19071 Primary osteoarthritis, right ankle and foot: Secondary | ICD-10-CM

## 2022-05-12 DIAGNOSIS — M5030 Other cervical disc degeneration, unspecified cervical region: Secondary | ICD-10-CM

## 2022-05-12 DIAGNOSIS — M5136 Other intervertebral disc degeneration, lumbar region: Secondary | ICD-10-CM | POA: Diagnosis not present

## 2022-05-12 DIAGNOSIS — M810 Age-related osteoporosis without current pathological fracture: Secondary | ICD-10-CM

## 2022-05-12 DIAGNOSIS — Z86010 Personal history of colonic polyps: Secondary | ICD-10-CM

## 2022-05-12 DIAGNOSIS — I341 Nonrheumatic mitral (valve) prolapse: Secondary | ICD-10-CM

## 2022-05-12 DIAGNOSIS — M7062 Trochanteric bursitis, left hip: Secondary | ICD-10-CM | POA: Diagnosis not present

## 2022-05-12 DIAGNOSIS — M19042 Primary osteoarthritis, left hand: Secondary | ICD-10-CM

## 2022-05-12 DIAGNOSIS — M542 Cervicalgia: Secondary | ICD-10-CM | POA: Diagnosis not present

## 2022-05-12 MED ORDER — TRIAMCINOLONE ACETONIDE 40 MG/ML IJ SUSP
40.0000 mg | INTRAMUSCULAR | Status: AC | PRN
  Administered 2022-05-12: 40 mg via INTRA_ARTICULAR

## 2022-05-12 MED ORDER — LIDOCAINE HCL 1 % IJ SOLN
1.5000 mL | INTRAMUSCULAR | Status: AC | PRN
  Administered 2022-05-12: 1.5 mL

## 2022-05-12 NOTE — Patient Instructions (Signed)
Iliotibial Band Syndrome Rehab Ask your health care provider which exercises are safe for you. Do exercises exactly as told by your health care provider and adjust them as directed. It is normal to feel mild stretching, pulling, tightness, or discomfort as you do these exercises. Stop right away if you feel sudden pain or your pain gets significantly worse. Do not begin these exercises until told by your health care provider. Stretching and range-of-motion exercises These exercises warm up your muscles and joints and improve the movement and flexibility of your hip and pelvis. Quadriceps stretch, prone  Lie on your abdomen (prone position) on a firm surface, such as a bed or padded floor. Bend your left / right knee and reach back to hold your ankle or pant leg. If you cannot reach your ankle or pant leg, loop a belt around your foot and grab the belt instead. Gently pull your heel toward your buttocks. Your knee should not slide out to the side. You should feel a stretch in the front of your thigh and knee (quadriceps). Hold this position for __________ seconds. Repeat __________ times. Complete this exercise __________ times a day. Iliotibial band stretch An iliotibial band is a strong band of muscle tissue that runs from the outer side of your hip to the outer side of your thigh and knee. Lie on your side with your left / right leg in the top position. Bend both of your knees and grab your left / right ankle. Stretch out your bottom arm to help you balance. Slowly bring your top knee back so your thigh goes behind your trunk. Slowly lower your top leg toward the floor until you feel a gentle stretch on the outside of your left / right hip and thigh. If you do not feel a stretch and your knee will not fall farther, place the heel of your other foot on top of your knee and pull your knee down toward the floor with your foot. Hold this position for __________ seconds. Repeat __________ times.  Complete this exercise __________ times a day. Strengthening exercises These exercises build strength and endurance in your hip and pelvis. Endurance is the ability to use your muscles for a long time, even after they get tired. Straight leg raises, side-lying This exercise strengthens the muscles that rotate the leg at the hip and move it away from your body (hip abductors). Lie on your side with your left / right leg in the top position. Lie so your head, shoulder, hip, and knee line up. You may bend your bottom knee to help you balance. Roll your hips slightly forward so your hips are stacked directly over each other and your left / right knee is facing forward. Tense the muscles in your outer thigh and lift your top leg 4-6 inches (10-15 cm). Hold this position for __________ seconds. Slowly lower your leg to return to the starting position. Let your muscles relax completely before doing another repetition. Repeat __________ times. Complete this exercise __________ times a day. Leg raises, prone This exercise strengthens the muscles that move the hips backward (hip extensors). Lie on your abdomen (prone position) on your bed or a firm surface. You can put a pillow under your hips if that is more comfortable for your lower back. Bend your left / right knee so your foot is straight up in the air. Squeeze your buttocks muscles and lift your left / right thigh off the bed. Do not let your back arch. Tense   your thigh muscle as hard as you can without increasing any knee pain. Hold this position for __________ seconds. Slowly lower your leg to return to the starting position and allow it to relax completely. Repeat __________ times. Complete this exercise __________ times a day. Hip hike Stand sideways on a bottom step. Stand on your left / right leg with your other foot unsupported next to the step. You can hold on to a railing or wall for balance if needed. Keep your knees straight and your  torso square. Then lift your left / right hip up toward the ceiling. Slowly let your left / right hip lower toward the floor, past the starting position. Your foot should get closer to the floor. Do not lean or bend your knees. Repeat __________ times. Complete this exercise __________ times a day. This information is not intended to replace advice given to you by your health care provider. Make sure you discuss any questions you have with your health care provider. Document Revised: 01/23/2020 Document Reviewed: 01/23/2020 Elsevier Patient Education  2023 [employer]  

## 2022-05-19 ENCOUNTER — Ambulatory Visit: Payer: Medicare Other

## 2022-05-24 ENCOUNTER — Ambulatory Visit (INDEPENDENT_AMBULATORY_CARE_PROVIDER_SITE_OTHER): Payer: Medicare Other

## 2022-05-24 DIAGNOSIS — M810 Age-related osteoporosis without current pathological fracture: Secondary | ICD-10-CM

## 2022-05-24 MED ORDER — DENOSUMAB 60 MG/ML ~~LOC~~ SOSY
60.0000 mg | PREFILLED_SYRINGE | Freq: Once | SUBCUTANEOUS | Status: AC
  Administered 2022-05-24: 60 mg via SUBCUTANEOUS

## 2022-05-28 ENCOUNTER — Other Ambulatory Visit: Payer: Self-pay | Admitting: Internal Medicine

## 2022-05-31 ENCOUNTER — Telehealth: Payer: Self-pay | Admitting: Internal Medicine

## 2022-05-31 NOTE — Telephone Encounter (Signed)
1.Medication Requested: LORazepam (ATIVAN) 1 MG tablet 2. Pharmacy (Name, [place], [facility]): [address], [address] Phone:  [phone]  Fax:  [phone]     3. On Med List: yes  4. Last Visit with PCP:  5. Next visit date with PCP:   Agent: Please be advised that RX refills may take up to 3 business days. We ask that you follow-up with your pharmacy.

## 2022-06-02 ENCOUNTER — Telehealth: Payer: Self-pay | Admitting: Rheumatology

## 2022-06-02 NOTE — Telephone Encounter (Signed)
Patient received an injection on 05/12/2022. Patient advised it is to soon to receive another injection. Patient states she has been using Voltaren Gel and Tylenol with little to no relief. Patient states the pain is in the same stop and now running her leg constantly. Patient states the pain is worse than when she was in the office on 05/12/2022. Please advise.

## 2022-06-02 NOTE — Telephone Encounter (Signed)
Patient called stating she is experiencing right hip pain that is shooting down her leg.  Patient states at her last appointment on 05/12/22 Dr. Estanislado Pandy told her if she had any trouble to call the office and we would squeeze her in for a cortisone injection.  Patient states she is in a lot of pain and requesting a return call ASAP.

## 2022-06-02 NOTE — Telephone Encounter (Signed)
I would recommend an evaluation by her orthopedist for further evaluation if there was no response to the cortisone injection.

## 2022-06-02 NOTE — Telephone Encounter (Signed)
Done on 7/3.  Thanks

## 2022-06-02 NOTE — Telephone Encounter (Signed)
Attempted to contact the patient and left message for patient to call the office.  

## 2022-06-02 NOTE — Telephone Encounter (Signed)
Patient advised [name] would recommend an evaluation by her orthopedist for further evaluation if there was no response to the cortisone injection. Patient expressed understanding.

## 2022-06-03 NOTE — Telephone Encounter (Signed)
Patient called the office stating she called yesterday about some pain in her hip and it isn't quite where she stated originally. Patient states the hip pain is more in the middle of her "back side". Patient hopes this information will help Dr. Estanislado Pandy more understand her issue.

## 2022-06-03 NOTE — Telephone Encounter (Signed)
Patient would like to come in for an appointment for evaluation.  Please schedule an appointment.

## 2022-06-07 ENCOUNTER — Ambulatory Visit (INDEPENDENT_AMBULATORY_CARE_PROVIDER_SITE_OTHER): Payer: Medicare Other

## 2022-06-07 ENCOUNTER — Ambulatory Visit: Payer: Medicare Other | Admitting: Rheumatology

## 2022-06-07 ENCOUNTER — Ambulatory Visit (INDEPENDENT_AMBULATORY_CARE_PROVIDER_SITE_OTHER): Payer: Medicare Other | Admitting: Rheumatology

## 2022-06-07 ENCOUNTER — Encounter: Payer: Self-pay | Admitting: Rheumatology

## 2022-06-07 VITALS — BP 130/71 | HR 81 | Ht 60.0 in | Wt 111.8 lb

## 2022-06-07 DIAGNOSIS — M533 Sacrococcygeal disorders, not elsewhere classified: Secondary | ICD-10-CM | POA: Diagnosis not present

## 2022-06-07 DIAGNOSIS — M19042 Primary osteoarthritis, left hand: Secondary | ICD-10-CM

## 2022-06-07 DIAGNOSIS — M898X5 Other specified disorders of bone, thigh: Secondary | ICD-10-CM

## 2022-06-07 DIAGNOSIS — Z8639 Personal history of other endocrine, nutritional and metabolic disease: Secondary | ICD-10-CM

## 2022-06-07 DIAGNOSIS — M19072 Primary osteoarthritis, left ankle and foot: Secondary | ICD-10-CM

## 2022-06-07 DIAGNOSIS — M19041 Primary osteoarthritis, right hand: Secondary | ICD-10-CM | POA: Diagnosis not present

## 2022-06-07 DIAGNOSIS — Z8719 Personal history of other diseases of the digestive system: Secondary | ICD-10-CM

## 2022-06-07 DIAGNOSIS — G8929 Other chronic pain: Secondary | ICD-10-CM

## 2022-06-07 DIAGNOSIS — M51369 Other intervertebral disc degeneration, lumbar region without mention of lumbar back pain or lower extremity pain: Secondary | ICD-10-CM

## 2022-06-07 DIAGNOSIS — M7062 Trochanteric bursitis, left hip: Secondary | ICD-10-CM

## 2022-06-07 DIAGNOSIS — M25551 Pain in right hip: Secondary | ICD-10-CM

## 2022-06-07 DIAGNOSIS — M810 Age-related osteoporosis without current pathological fracture: Secondary | ICD-10-CM

## 2022-06-07 DIAGNOSIS — E034 Atrophy of thyroid (acquired): Secondary | ICD-10-CM

## 2022-06-07 DIAGNOSIS — M5136 Other intervertebral disc degeneration, lumbar region: Secondary | ICD-10-CM

## 2022-06-07 DIAGNOSIS — M5030 Other cervical disc degeneration, unspecified cervical region: Secondary | ICD-10-CM

## 2022-06-07 DIAGNOSIS — M19071 Primary osteoarthritis, right ankle and foot: Secondary | ICD-10-CM

## 2022-06-07 DIAGNOSIS — I10 Essential (primary) hypertension: Secondary | ICD-10-CM

## 2022-06-07 MED ORDER — TRIAMCINOLONE ACETONIDE 40 MG/ML IJ SUSP
40.0000 mg | INTRAMUSCULAR | Status: AC | PRN
  Administered 2022-06-07: 40 mg via INTRAMUSCULAR

## 2022-06-07 MED ORDER — LIDOCAINE HCL 1 % IJ SOLN
1.5000 mL | INTRAMUSCULAR | Status: AC | PRN
  Administered 2022-06-07: 1.5 mL

## 2022-06-07 NOTE — Progress Notes (Signed)
Office Visit Note  Patient: [name]             Date of Birth: 05/27/1936           MRN: [id number]             PCP: Cassandria Anger, MD Referring: Cassandria Anger, MD Visit Date: 06/07/2022 Occupation: '@GUAROCC'$ @  Subjective:  Right hip pain  History of Present Illness: [name] is a 86 y.o. female with history of osteoarthritis and degenerative disc disease.  She states for the last 2 weeks she has been having pain and discomfort in her right hip.  She states the pain intermittently goes down into her leg and sometimes into her foot.  There is no history of fall or an injury.  She also has intermittent tingling in her right foot.  She denies lower back pain.  Her neck pain is better with dry needling.  She was evaluated by neurosurgeon recently and had x-rays of her cervical spine.  She states that left trochanteric bursitis is better.  Activities of Daily Living:  Patient reports morning stiffness for 0 minutes.   Patient Reports nocturnal pain.  Difficulty dressing/grooming: Denies Difficulty climbing stairs: Denies Difficulty getting out of chair: Denies Difficulty using hands for taps, buttons, cutlery, and/or writing: Denies  Review of Systems  Constitutional:  Positive for fatigue.  HENT:  Positive for mouth dryness. Negative for mouth sores and nose dryness.   Eyes:  Negative for pain, itching and dryness.  Respiratory:  Negative for shortness of breath and difficulty breathing.   Cardiovascular:  Negative for chest pain and palpitations.  Gastrointestinal:  Negative for blood in stool, constipation and diarrhea.  Endocrine: Negative for increased urination.  Genitourinary:  Negative for difficulty urinating.  Musculoskeletal:  Positive for joint pain, joint pain, myalgias and myalgias. Negative for joint swelling and morning stiffness.  Skin:  Negative for color change, rash and redness.  Allergic/Immunologic: Negative for susceptible to infections.   Neurological:  Positive for parasthesias. Negative for dizziness, numbness, headaches, memory loss and weakness.  Hematological:  Negative for bruising/bleeding tendency.  Psychiatric/Behavioral:  Negative for confusion.     PMFS History:  Patient Active Problem List   Diagnosis Date Noted   Gait disorder 02/28/2022   History of malignant neoplasm of skin 02/22/2022   Melanocytic nevi of trunk 02/22/2022   Seborrheic dermatitis 02/22/2022   Urticaria 02/22/2022   Irregular bowel habits 11/18/2021   Scalp laceration, sequela 11/02/2021   Concussion 10/29/2021   Scalp itch 08/19/2021   Allergic rhinitis due to animal (cat) (dog) hair and dander 08/07/2021   Allergic rhinitis due to pollen 08/07/2021   Mild intermittent asthma 08/07/2021   Closed fracture of first cervical vertebra ([place]) 08/07/2021   Closed fracture of second cervical vertebra ([place]) 08/07/2021   Cervical spine instability 05/15/2021   Closed 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 05/13/2021   Cervical spinal cord compression ([place]) 05/04/2021   Fall against object 03/25/2021   Hematoma of face, initial encounter 03/25/2021   Degenerative disc disease, cervical 03/11/2021   Spondylolisthesis, cervical region 03/11/2021   Trochanteric bursitis of right hip 09/22/2020   Elevated blood-pressure reading, without diagnosis of hypertension 07/14/2020   Meningioma ([place]) 03/25/2020   Arthralgia 03/18/2020   RUQ pain 01/02/2020   Cholelithiasis 09/09/2019   Ear pain, bilateral 03/21/2019   Cervical spondylosis 01/05/2019   Neck pain 12/26/2018   Acute pain of left shoulder 12/26/2018  Anxiety 08/22/2018   Contact dermatitis and eczema 06/14/2018   Constipation 11/11/2016   Low back pain 11/10/2016   Leg abrasion 08/12/2016   Contusion of right knee 08/12/2016   Sinusitis, chronic 08/06/2016   Rash and nonspecific skin eruption 04/20/2016   Aortic valve sclerosis 03/12/2015   Contusion of  left chest wall 01/09/2015   Fatigue 09/08/2014   Bladder pain 08/19/2014   Increased frequency of urination 08/19/2014   Urinary urgency 08/19/2014   Postherpetic neuralgia 10/02/2013   Impacted cerumen of right ear 07/16/2013   Headache 07/10/2013   Aortic stenosis 04/19/2013   Insomnia 01/24/2013   Cough 08/07/2012   URI (upper respiratory infection) 08/03/2012   Pruritus of skin 03/11/2012   MVP (mitral valve prolapse)    Multiple thyroid nodules 10/04/2011   Herpes zoster 09/07/2011   Essential hypertension, benign 07/27/2011   Atrophic vaginitis    Well adult exam 03/15/2011   Hyperlipemia, mixed 03/15/2011   OVERACTIVE BLADDER 09/02/2010   Hypothyroidism 03/03/2010   CALF PAIN, LEFT 10/27/2009   Belching 07/21/2009   EAR PAIN 12/19/2008   Allergic rhinitis 12/19/2008   Irritable bowel syndrome 09/04/2008   PERSONAL HX COLONIC POLYPS 09/04/2008   GASTRITIS, CHRONIC 09/03/2008   DUODENITIS, WITHOUT HEMORRHAGE 09/03/2008   TIBIALIS TENDINITIS 03/20/2008   Osteoarthritis 02/25/2008   SWEATING 02/21/2008   LACTOSE INTOLERANCE 11/20/2007   Dyslipidemia 11/20/2007   GERD 11/20/2007   Osteoporosis 11/20/2007    Past Medical History:  Diagnosis Date   Allergic rhinitis    Asthma    Atrophic vaginitis    Baker's cyst    Left-Dr. Aluisio   Cataract    Dr. Katy Fitch   CHF (congestive heart failure) (HCC)    Colon polyps    Fibroid    Gastritis    chronic   GERD (gastroesophageal reflux disease)    Heart murmur    Hemorrhoids    Hyperlipidemia    Hypothyroidism    IBS (irritable bowel syndrome)    MVP (mitral valve prolapse)    Antibiotics required for dental procedures   OA (osteoarthritis)    Osteoporosis 06/2018   T score -2.2 stable on Prolia   Overactive bladder    Thyroid disease    Thyroid nodule    Torn meniscus    bilateral    Family History  Problem Relation Age of Onset   Heart disease Mother    Hypertension Mother    Osteoporosis Mother     Congestive Heart Failure Mother    Pancreatic cancer Father    Heart attack Brother    Drug abuse Brother        over dose    Healthy Son    Healthy Son    Colon cancer Neg Hx    Colon polyps Neg Hx    Rectal cancer Neg Hx    Stomach cancer Neg Hx    Past Surgical History:  Procedure Laterality Date   CATARACT EXTRACTION, BILATERAL     EYE SURGERY     POSTERIOR CERVICAL FUSION/FORAMINOTOMY N/A 05/15/2021   Procedure: Cervical One Laminectomy with resection of cyst, Fixation from Occiput to Cervical Four;  Surgeon: Erline Levine, MD;  Location: [facility];  Service: Neurosurgery;  Laterality: N/A;   TONSILLECTOMY     Social History   Social History Narrative   Regular exercise-yes   Daily caffeine use   Immunization History  Administered Date(s) Administered   Fluad Quad(high Dose 65+) 08/04/2019, 09/16/2021   Influenza Whole 09/29/2007, 08/21/2008,  09/02/2010, 07/30/2012   Influenza, High Dose Seasonal PF 09/03/2013, 02/19/2015, 11/12/2015, 08/06/2016, 11/11/2016, 08/20/2017, 08/31/2017, 09/20/2018, 09/12/2019   Influenza,inj,Quad PF,6+ Mos 12/17/2014, 07/30/2015   PFIZER(Purple Top)SARS-COV-2 Vaccination 12/12/2019, 12/31/2019, 07/29/2020, 07/29/2020, 07/31/2020   Pneumococcal Conjugate-13 01/09/2014   Pneumococcal Polysaccharide-23 09/26/2006, 08/12/2015, 11/11/2016, 08/31/2017, 09/10/2020, 09/10/2021   Td 04/14/2010   Tdap 06/18/2020   Typhoid Inactivated 02/23/2013   Zoster, Live 12/09/2006     Objective: Vital Signs: BP 130/71 (BP Location: Left Arm, Patient Position: Sitting, Cuff Size: Normal)   Pulse 81   Ht 5' (1.524 m)   Wt 111 lb 12.8 oz (50.7 kg)   BMI 21.83 kg/m    Physical Exam Vitals and nursing note reviewed.  Constitutional:      Appearance: She is well-developed.  HENT:     Head: Normocephalic and atraumatic.  Eyes:     Conjunctiva/sclera: Conjunctivae normal.  Cardiovascular:     Rate and Rhythm: Normal rate and regular rhythm.     Heart sounds:  Normal heart sounds.  Pulmonary:     Effort: Pulmonary effort is normal.     Breath sounds: Normal breath sounds.  Abdominal:     General: Bowel sounds are normal.     Palpations: Abdomen is soft.  Musculoskeletal:     Cervical back: Normal range of motion.  Lymphadenopathy:     Cervical: No cervical adenopathy.  Skin:    General: Skin is warm and dry.     Capillary Refill: Capillary refill takes less than 2 seconds.  Neurological:     Mental Status: She is alert and oriented to person, place, and time.  Psychiatric:        Behavior: Behavior normal.      Musculoskeletal Exam: Very limited range of motion of the cervical spine.  Shoulder joints, elbow joints, wrist joints in good range of motion.  She had bilateral PIP and DIP thickening.  She had good range of motion of bilateral hip joints with tenderness over right piriformis region.  She also had tenderness over the distal part of her femur.  Knee joints with good range of motion.  There was no tenderness over ankles or MTPs.  CDAI Exam: CDAI Score: -- Patient Global: --; Provider Global: -- Swollen: --; Tender: -- Joint Exam 06/07/2022   No joint exam has been documented for this visit   There is currently no information documented on the homunculus. Go to the Rheumatology activity and complete the homunculus joint exam.  Investigation: No additional findings.  Imaging: XR HIP UNILAT W OR W/O PELVIS 2-3 VIEWS RIGHT  Result Date: 06/07/2022 Degenerative change changes were noted in the SI joints.  L4-L5, L5-S1 narrowing was noted.  Extra-articular calcification was noted in the hip joint.  No significant hip joint narrowing was noted. Impression: Unremarkable x-ray of the hip joint.  Degenerative changes of the lumbar spine were noted.  XR FEMUR, MIN 2 VIEWS RIGHT  Result Date: 06/07/2022 No acute fracture was noted.  No cortical defect was noted.  No bony pathology or soft tissue swelling was noted. Impression:  Negative for acute bony abnormality   Recent Labs: Lab Results  Component Value Date   WBC 6.9 02/17/2022   HGB 12.7 02/17/2022   PLT 279.0 02/17/2022   NA 137 02/17/2022   K 4.9 02/17/2022   CL 100 02/17/2022   CO2 29 02/17/2022   GLUCOSE 85 02/17/2022   BUN 16 02/17/2022   CREATININE 0.82 02/17/2022   BILITOT 0.4 02/17/2022   ALKPHOS  33 (L) 02/17/2022   AST 17 02/17/2022   ALT 14 02/17/2022   PROT 7.1 02/17/2022   ALBUMIN 4.4 02/17/2022   CALCIUM 9.4 02/17/2022   GFRAA 87 07/17/2020    Speciality Comments: No specialty comments available.  Procedures:  Trigger Point Inj  Date/Time: 06/07/2022 12:05 PM  Performed by: Bo Merino, MD Authorized by: Bo Merino, MD   Consent Given by:  Patient Site marked: the procedure site was marked   Timeout: prior to procedure the correct patient, procedure, and site was verified   Indications:  Pain and therapeutic Total # of Trigger Points:  1 Location: lower extremity   Needle Size:  27 G Approach:  Dorsal Medications #1:  1.5 mL lidocaine 1 %; 40 mg triamcinolone acetonide 40 MG/ML Patient tolerance:  Patient tolerated the procedure well with no immediate complications  Allergies: Bee venom, Bacitracin-polymyxin b, Clarithromycin, Doxycycline, Neosporin [neomycin-bacitracin zn-polymyx], and Hydrocortisone   Assessment / Plan:     Visit Diagnoses: Pain in right hip -planes of pain and discomfort in her right hip for the last 2 weeks.  There is no history of fall or injury.  She had tenderness over the right gluteal and piriformis region.  She states the pain radiates down into her right thigh.  Plan: XR HIP UNILAT W OR W/O PELVIS 2-3 VIEWS RIGHT.  X-ray of the hip joint was unremarkable except for some degenerative changes in the lumbar spine and extra-articular calcification around the hip joint was noted.  X-ray findings were discussed with the patient.  Per patient's request right piriformis region was injected as  described above.  She tolerated the procedure well.  Postprocedure instructions were given.  I advised patient to schedule appointment with Dr. Reatha Armour if her symptoms persist as the pain could be coming from her lumbar spine.  Pain in right femur -she also had tenderness over the distal femur.  Plan: XR FEMUR, MIN 2 VIEWS RIGHT.  X-ray of the femur was unremarkable.  X-ray findings were discussed with the patient.  Chronic SI joint pain-she had intermittent SI joint pain in the past.  She had no SI joint tenderness on examination today.  Primary osteoarthritis of both hands-she had bilateral PIP and DIP thickening with no increased discomfort.  Primary osteoarthritis of both feet-she continues to have some bilateral feet discomfort.  DDD (degenerative disc disease), cervical-she continues to have limited range of motion of the cervical spine due to fusion.  She had improvement with dry needling.  She had fusion in the past by Dr. Reatha Armour.  She states she recently had x-rays of her cervical spine which was stable.  DDD (degenerative disc disease), lumbar-she denies any increased lower back pain.  Age-related osteoporosis without current pathological fracture-she is on Prolia by her GYN.  Other medical problems are listed as follows:  Essential hypertension, benign  History of gastroesophageal reflux (GERD)  History of IBS  History of hyperlipidemia  Hypothyroidism due to acquired atrophy of thyroid  Orders: Orders Placed This Encounter  Procedures   Trigger Point Inj   XR HIP UNILAT W OR W/O PELVIS 2-3 VIEWS RIGHT   XR FEMUR, MIN 2 VIEWS RIGHT   No orders of the defined types were placed in this encounter.    Follow-Up Instructions: Return for OA.   Bo Merino, MD  Note - This record has been created using Editor, commissioning.  Chart creation errors have been sought, but may not always  have been located. Such creation errors do not  reflect on  the standard of medical  care.

## 2022-06-09 ENCOUNTER — Telehealth: Payer: Self-pay | Admitting: Internal Medicine

## 2022-06-09 DIAGNOSIS — E034 Atrophy of thyroid (acquired): Secondary | ICD-10-CM

## 2022-06-09 DIAGNOSIS — R5382 Chronic fatigue, unspecified: Secondary | ICD-10-CM

## 2022-06-09 DIAGNOSIS — F419 Anxiety disorder, unspecified: Secondary | ICD-10-CM

## 2022-06-09 NOTE — Telephone Encounter (Signed)
No labs orders in system pls advise.Marland KitchenJohny [name]

## 2022-06-09 NOTE — Telephone Encounter (Signed)
Pt has appt with PCP 8/7 and also scheduled for "bloodwork" with lab appt on 7/31.  Please enter orders for labs.

## 2022-06-10 NOTE — Telephone Encounter (Signed)
OK. Thx

## 2022-06-18 ENCOUNTER — Other Ambulatory Visit (HOSPITAL_COMMUNITY): Payer: Self-pay

## 2022-06-18 DIAGNOSIS — I350 Nonrheumatic aortic (valve) stenosis: Secondary | ICD-10-CM

## 2022-06-18 NOTE — Progress Notes (Signed)
Orders Placed This Encounter  Procedures   ECHOCARDIOGRAM COMPLETE    Standing Status:   Future    Standing Expiration Date:   06/19/2023    Order Specific Question:   Where should this test be performed    Answer:   [facility]    Order Specific Question:   Perflutren DEFINITY (image enhancing agent) should be administered unless hypersensitivity or allergy exist    Answer:   Administer Perflutren    Order Specific Question:   Reason for exam-Echo    Answer:   Aortic stenosis I35.0    Order Specific Question:   Release to patient    Answer:   Immediate

## 2022-06-24 ENCOUNTER — Other Ambulatory Visit (HOSPITAL_COMMUNITY): Payer: Medicare Other

## 2022-06-24 ENCOUNTER — Encounter (HOSPITAL_COMMUNITY): Payer: Medicare Other

## 2022-06-25 ENCOUNTER — Encounter (HOSPITAL_COMMUNITY): Payer: Self-pay

## 2022-06-25 ENCOUNTER — Ambulatory Visit (HOSPITAL_COMMUNITY)
Admission: RE | Admit: 2022-06-25 | Discharge: 2022-06-25 | Disposition: A | Discharge status: Home / Self Care | Payer: Medicare Other | Source: Ambulatory Visit | Attending: Internal Medicine | Admitting: Internal Medicine

## 2022-06-25 ENCOUNTER — Ambulatory Visit (HOSPITAL_BASED_OUTPATIENT_CLINIC_OR_DEPARTMENT_OTHER)
Admission: RE | Admit: 2022-06-25 | Discharge: 2022-06-25 | Disposition: A | Discharge status: Home / Self Care | Payer: Medicare Other | Source: Ambulatory Visit | Attending: Family Medicine | Admitting: Family Medicine

## 2022-06-25 VITALS — BP 118/60 | HR 78 | Ht 60.0 in | Wt 109.8 lb

## 2022-06-25 DIAGNOSIS — I351 Nonrheumatic aortic (valve) insufficiency: Secondary | ICD-10-CM

## 2022-06-25 DIAGNOSIS — R202 Paresthesia of skin: Secondary | ICD-10-CM | POA: Diagnosis not present

## 2022-06-25 DIAGNOSIS — I509 Heart failure, unspecified: Secondary | ICD-10-CM | POA: Insufficient documentation

## 2022-06-25 DIAGNOSIS — I080 Rheumatic disorders of both mitral and aortic valves: Secondary | ICD-10-CM | POA: Diagnosis not present

## 2022-06-25 DIAGNOSIS — E785 Hyperlipidemia, unspecified: Secondary | ICD-10-CM | POA: Insufficient documentation

## 2022-06-25 DIAGNOSIS — Z79899 Other long term (current) drug therapy: Secondary | ICD-10-CM | POA: Diagnosis not present

## 2022-06-25 DIAGNOSIS — I110 Hypertensive heart disease with heart failure: Secondary | ICD-10-CM | POA: Diagnosis not present

## 2022-06-25 DIAGNOSIS — I350 Nonrheumatic aortic (valve) stenosis: Secondary | ICD-10-CM

## 2022-06-25 DIAGNOSIS — I10 Essential (primary) hypertension: Secondary | ICD-10-CM | POA: Diagnosis not present

## 2022-06-25 DIAGNOSIS — I341 Nonrheumatic mitral (valve) prolapse: Secondary | ICD-10-CM | POA: Diagnosis not present

## 2022-06-25 LAB — ECHOCARDIOGRAM COMPLETE
AR max vel: 1.97 cm2
AV Mean grad: 5 mmHg
AV Peak grad: 9.8 mmHg
Ao pk vel: 1.57 m/s
Area-P 1/2: 3.21 cm2
P 1/2 time: 418 msec
S' Lateral: 1.8 cm

## 2022-06-25 NOTE — Progress Notes (Signed)
CARDIOLOGY CLINIC NOTE  Patient ID: [name], female   DOB: 31-Oct-1936, 86 y.o.   MRN: [id number]  HPI: [name] is a. 86 y.o. woman with a history of hyperlipidemia, mild mitral valve prolapse with mild mitral regurgitation (echo 12/11).  She has undergone CT angiogram at the [facility] about 10 years ago which was totally normal.   Echo 02/02/21: EF 60-65% G 1 DD AoV moderately calcified. Mild AS Mean gradient 12m HG Mild AI.   Today she returns for yearly cardiology follow up. Overall feeling fine. Main complaint is tingling in right lower leg, recently had steroid injection in back. No falls or weakness in RLE. Denies increasing SOB, palpitations, CP, dizziness, edema, or PND/Orthopnea. Appetite ok. No fever or chills. Taking all medications.   Echo today (06/25/22), results pending  Cardiac Studies: - Echo 8/20 EF 60-65 grade I DD Mild AI. No significant AS   - Echo 02/2018: EF 60% Mild MR Calcified AoV Very mild AS mean gradient 4 Mild AI Grade I DD   - Echo 02/2017: EF 55%Trivial MVP Mild MR Calcified Aov Moves well. Mild AI. No AS  ROS: All systems negative except as listed in HPI, PMH and Problem List.  Past Medical History:  Diagnosis Date   Allergic rhinitis    Asthma    Atrophic vaginitis    Baker's cyst    Left-Dr. Aluisio   Cataract    Dr. GKaty [name]  CHF (congestive heart failure) (HCC)    Colon polyps    Fibroid    Gastritis    chronic   GERD (gastroesophageal reflux disease)    Heart murmur    Hemorrhoids    Hyperlipidemia    Hypothyroidism    IBS (irritable bowel syndrome)    MVP (mitral valve prolapse)    Antibiotics required for dental procedures   OA (osteoarthritis)    Osteoporosis 06/2018   T score -2.2 stable on Prolia   Overactive bladder    Thyroid disease    Thyroid nodule    Torn meniscus    bilateral    Current Outpatient Medications  Medication Sig Dispense Refill   acetaminophen (TYLENOL) 500 MG tablet Take 1,000 mg by mouth every  6 (six) hours as needed for mild pain.     albuterol (VENTOLIN HFA) 108 (90 Base) MCG/ACT inhaler Take 2 puffs every 4-6 hours as needed     Azelastine HCl 0.15 % SOLN Place into both nostrils. Place into both nostrils.     BLUE-EMU MAXIMUM STRENGTH 2.5 % LIQD Use bid 60 mL 3   Calcium Carbonate-Vitamin D (CALCIUM-VITAMIN D) 500-200 MG-UNIT per tablet Take 3 tablets by mouth daily.     clobetasol (TEMOVATE) 0.05 % external solution Apply 1 application topically 2 (two) times daily as needed (on scalp). 50 mL 1   desonide (DESOWEN) 0.05 % cream Apply 1 application topically as needed.     diclofenac Sodium (VOLTAREN) 1 % GEL APPLY 4 GRAMS 4 TIMES DAILY. 200 g 3   EPINEPHrine (EPIPEN 2-PAK) 0.3 mg/0.3 mL IJ SOAJ injection Inject 0.3 mLs (0.3 mg total) into the muscle as needed. 1 Device 1   estradiol (ESTRACE) 0.1 MG/GM vaginal cream Place 1 gram vaginally 2 x a week at hs 42.5 g 1   famotidine (PEPCID) 40 MG tablet Take 1 tablet (40 mg total) by mouth at bedtime. NEEDS OFFICE VISIT FOR ADDITIONAL REFILLS 30 tablet 2   gabapentin (NEURONTIN) 100 MG capsule Take  100 mg by mouth as needed.     ipratropium (ATROVENT) 0.06 % nasal spray Place 1 spray into the nose 3 (three) times daily as needed for rhinitis. 15 mL 0   levocetirizine (XYZAL) 5 MG tablet Take 5 mg by mouth every evening.     levothyroxine (SYNTHROID) 50 MCG tablet TAKE ONE TABLET BY MOUTH DAILY BEFORE BREAKFAST. 30 tablet 5   LORazepam (ATIVAN) 1 MG tablet Take 2-3 tablets at bedtime and 1-2 tablets in the daytime as needed. 150 tablet 2   methocarbamol (ROBAXIN) 500 MG tablet Take 1 tablet (500 mg total) by mouth at bedtime as needed for muscle spasms. 30 tablet 1   montelukast (SINGULAIR) 10 MG tablet Take 1 tablet (10 mg total) by mouth at bedtime. 90 tablet 1   pantoprazole (PROTONIX) 40 MG tablet TAKE (1) TABLET TWICE A DAY BEFORE MEALS. (Patient taking differently: as needed.) 60 tablet 5   polyethylene glycol powder  (GLYCOLAX/MIRALAX) 17 GM/SCOOP powder Take 17 g by mouth 2 (two) times daily as needed for moderate constipation. 500 g 3   simvastatin (ZOCOR) 40 MG tablet TAKE 1 TABLET ONCE DAILY. 90 tablet 3   traMADol (ULTRAM) 50 MG tablet Take 0.5-1 tablets (25-50 mg total) by mouth every 6 (six) hours as needed for severe pain. 120 tablet 1   triamcinolone (NASACORT) 55 MCG/ACT AERO nasal inhaler Place 2 sprays into the nose as needed.     triamcinolone cream (KENALOG) 0.1 % Apply 1 application. topically 2 (two) times daily. APPLY TOPICALLY TWICE A DAY 45 g 1   VESICARE 10 MG tablet Take 1 tablet (10 mg total) by mouth daily as needed (bladder). 90 tablet 3   No current facility-administered medications for this encounter.    BP 118/60   Pulse 78   Ht 5' (1.524 m)   Wt 49.8 kg (109 lb 12.8 oz)   SpO2 98%   BMI 21.44 kg/m   Wt Readings from Last 3 Encounters:  06/25/22 49.8 kg (109 lb 12.8 oz)  06/07/22 50.7 kg (111 lb 12.8 oz)  05/12/22 50.8 kg (112 lb)   PHYSICAL EXAM: General:  NAD. No resp difficulty, walked into clinic. HEENT: Normal Neck: Supple. No JVD. Carotids 2+ bilat; no bruits. No lymphadenopathy or thryomegaly appreciated. Cor: PMI nondisplaced. Regular rate & rhythm. No rubs, gallops or murmurs. Clear S2 Lungs: Clear Abdomen: Soft, nontender, nondistended. No hepatosplenomegaly. No bruits or masses. Good bowel sounds. Extremities: No cyanosis, clubbing, rash, edema Neuro: Alert & oriented x 3, cranial nerves grossly intact. Moves all 4 extremities w/o difficulty. Affect pleasant.  ECG (personally reviewed): NSR 77 bpm  ASSESSMENT & PLAN:  1. Aortic valve thickening with mild AS/mild AI - Echo 02/02/21 EF 60-65 grade I DD Mild AI. No significant AS  - Echo today (06/25/22): results pending.  2. MVP, mild -  Mild MR on echo   3. Hyperlipidemia - Followed by PCP - Continue simvastatin 40 mg.   - LDL 99 (3/23)  4. HTN - Blood pressure well controlled.  - Continue  current regimen.  Follow up in 1 year with Dr. Haroldine [name].  Rafael Bihari, FNP 1:59 PM

## 2022-06-25 NOTE — Patient Instructions (Signed)
Follow up in 1  year with Dr.Bensimhon   Do the following things EVERYDAY: Weigh yourself in the morning before breakfast. Write it down and keep it in a log. Take your medicines as prescribed Eat low salt foods--Limit salt (sodium) to 2000 mg per day.  Stay as active as you can everyday Limit all fluids for the day to less than 2 liters

## 2022-06-28 ENCOUNTER — Other Ambulatory Visit (INDEPENDENT_AMBULATORY_CARE_PROVIDER_SITE_OTHER): Payer: Medicare Other

## 2022-06-28 DIAGNOSIS — E034 Atrophy of thyroid (acquired): Secondary | ICD-10-CM | POA: Diagnosis not present

## 2022-06-28 DIAGNOSIS — R5382 Chronic fatigue, unspecified: Secondary | ICD-10-CM | POA: Diagnosis not present

## 2022-06-28 LAB — CBC WITH DIFFERENTIAL/PLATELET
Basophils Absolute: 0 10*3/uL (ref 0.0–0.1)
Basophils Relative: 0.8 % (ref 0.0–3.0)
Eosinophils Absolute: 0.2 10*3/uL (ref 0.0–0.7)
Eosinophils Relative: 3 % (ref 0.0–5.0)
HCT: 36.8 % (ref 36.0–46.0)
Hemoglobin: 12.3 g/dL (ref 12.0–15.0)
Lymphocytes Relative: 14.6 % (ref 12.0–46.0)
Lymphs Abs: 0.9 10*3/uL (ref 0.7–4.0)
MCHC: 33.5 g/dL (ref 30.0–36.0)
MCV: 97 fl (ref 78.0–100.0)
Monocytes Absolute: 0.8 10*3/uL (ref 0.1–1.0)
Monocytes Relative: 12.2 % — ABNORMAL HIGH (ref 3.0–12.0)
Neutro Abs: 4.4 10*3/uL (ref 1.4–7.7)
Neutrophils Relative %: 69.4 % (ref 43.0–77.0)
Platelets: 230 10*3/uL (ref 150.0–400.0)
RBC: 3.79 Mil/uL — ABNORMAL LOW (ref 3.87–5.11)
RDW: 13.3 % (ref 11.5–15.5)
WBC: 6.4 10*3/uL (ref 4.0–10.5)

## 2022-06-28 LAB — COMPREHENSIVE METABOLIC PANEL
ALT: 24 U/L (ref 0–35)
AST: 19 U/L (ref 0–37)
Albumin: 4.1 g/dL (ref 3.5–5.2)
Alkaline Phosphatase: 37 U/L — ABNORMAL LOW (ref 39–117)
BUN: 14 mg/dL (ref 6–23)
CO2: 27 mEq/L (ref 19–32)
Calcium: 8.8 mg/dL (ref 8.4–10.5)
Chloride: 101 mEq/L (ref 96–112)
Creatinine, Ser: 0.73 mg/dL (ref 0.40–1.20)
GFR: 75.75 mL/min (ref >60.00)
Glucose, Bld: 93 mg/dL (ref 70–99)
Potassium: 4.2 mEq/L (ref 3.5–5.1)
Sodium: 136 mEq/L (ref 135–145)
Total Bilirubin: 0.6 mg/dL (ref 0.2–1.2)
Total Protein: 7.2 g/dL (ref 6.0–8.3)

## 2022-06-29 ENCOUNTER — Telehealth (HOSPITAL_COMMUNITY): Payer: Self-pay | Admitting: *Deleted

## 2022-06-29 NOTE — Telephone Encounter (Signed)
Pt called for echo results she is aware that Dr.Bensimhon is on vacation and we will call with results.

## 2022-06-30 ENCOUNTER — Other Ambulatory Visit: Payer: Self-pay | Admitting: Neurological Surgery

## 2022-06-30 ENCOUNTER — Ambulatory Visit: Payer: Medicare Other | Admitting: Family Medicine

## 2022-06-30 DIAGNOSIS — M542 Cervicalgia: Secondary | ICD-10-CM | POA: Diagnosis not present

## 2022-06-30 DIAGNOSIS — G521 Disorders of glossopharyngeal nerve: Secondary | ICD-10-CM | POA: Diagnosis not present

## 2022-06-30 DIAGNOSIS — M5416 Radiculopathy, lumbar region: Secondary | ICD-10-CM

## 2022-07-05 ENCOUNTER — Telehealth (HOSPITAL_COMMUNITY): Payer: Self-pay

## 2022-07-05 ENCOUNTER — Ambulatory Visit (INDEPENDENT_AMBULATORY_CARE_PROVIDER_SITE_OTHER): Payer: Medicare Other | Admitting: Internal Medicine

## 2022-07-05 ENCOUNTER — Encounter: Payer: Self-pay | Admitting: Internal Medicine

## 2022-07-05 DIAGNOSIS — G521 Disorders of glossopharyngeal nerve: Secondary | ICD-10-CM

## 2022-07-05 DIAGNOSIS — G8929 Other chronic pain: Secondary | ICD-10-CM

## 2022-07-05 DIAGNOSIS — M545 Low back pain, unspecified: Secondary | ICD-10-CM

## 2022-07-05 DIAGNOSIS — E034 Atrophy of thyroid (acquired): Secondary | ICD-10-CM | POA: Diagnosis not present

## 2022-07-05 DIAGNOSIS — M532X2 Spinal instabilities, cervical region: Secondary | ICD-10-CM | POA: Diagnosis not present

## 2022-07-05 DIAGNOSIS — F419 Anxiety disorder, unspecified: Secondary | ICD-10-CM | POA: Diagnosis not present

## 2022-07-05 HISTORY — DX: Disorders of glossopharyngeal nerve: G52.1

## 2022-07-05 NOTE — Assessment & Plan Note (Signed)
On Levothroid brand

## 2022-07-05 NOTE — Telephone Encounter (Signed)
Please annotate on patients echo

## 2022-07-05 NOTE — Progress Notes (Signed)
Subjective:  Patient ID: [name], female    DOB: [birth year]  Age: 86 y.o. MRN: [id number]  CC: Follow-up (3 month f/u)   HPI [name] presents for R buttock/RLE pain, tingling - on Gabapentin per Dr Estanislado Pandy, had injection. LS MRI was ordered by Dr Reatha Armour.  She did not have it done yet. F/u n HTN, insomnia  Outpatient Medications Prior to Visit  Medication Sig Dispense Refill   acetaminophen (TYLENOL) 500 MG tablet Take 1,000 mg by mouth every 6 (six) hours as needed for mild pain.     albuterol (VENTOLIN HFA) 108 (90 Base) MCG/ACT inhaler Take 2 puffs every 4-6 hours as needed     Azelastine HCl 0.15 % SOLN Place into both nostrils. Place into both nostrils.     BLUE-EMU MAXIMUM STRENGTH 2.5 % LIQD Use bid 60 mL 3   Calcium Carbonate-Vitamin D (CALCIUM-VITAMIN D) 500-200 MG-UNIT per tablet Take 3 tablets by mouth daily.     clobetasol (TEMOVATE) 0.05 % external solution Apply 1 application topically 2 (two) times daily as needed (on scalp). 50 mL 1   desonide (DESOWEN) 0.05 % cream Apply 1 application topically as needed.     diclofenac Sodium (VOLTAREN) 1 % GEL APPLY 4 GRAMS 4 TIMES DAILY. 200 g 3   EPINEPHrine (EPIPEN 2-PAK) 0.3 mg/0.3 mL IJ SOAJ injection Inject 0.3 mLs (0.3 mg total) into the muscle as needed. 1 Device 1   estradiol (ESTRACE) 0.1 MG/GM vaginal cream Place 1 gram vaginally 2 x a week at hs 42.5 g 1   famotidine (PEPCID) 40 MG tablet Take 1 tablet (40 mg total) by mouth at bedtime. NEEDS OFFICE VISIT FOR ADDITIONAL REFILLS 30 tablet 2   gabapentin (NEURONTIN) 100 MG capsule Take 100 mg by mouth as needed.     ipratropium (ATROVENT) 0.06 % nasal spray Place 1 spray into the nose 3 (three) times daily as needed for rhinitis. 15 mL 0   levocetirizine (XYZAL) 5 MG tablet Take 5 mg by mouth every evening.     levothyroxine (SYNTHROID) 50 MCG tablet TAKE ONE TABLET BY MOUTH DAILY BEFORE BREAKFAST. 30 tablet 5   LORazepam (ATIVAN) 1 MG tablet Take 2-3 tablets at  bedtime and 1-2 tablets in the daytime as needed. 150 tablet 2   methocarbamol (ROBAXIN) 500 MG tablet Take 1 tablet (500 mg total) by mouth at bedtime as needed for muscle spasms. 30 tablet 1   montelukast (SINGULAIR) 10 MG tablet Take 1 tablet (10 mg total) by mouth at bedtime. 90 tablet 1   pantoprazole (PROTONIX) 40 MG tablet TAKE (1) TABLET TWICE A DAY BEFORE MEALS. (Patient taking differently: as needed.) 60 tablet 5   polyethylene glycol powder (GLYCOLAX/MIRALAX) 17 GM/SCOOP powder Take 17 g by mouth 2 (two) times daily as needed for moderate constipation. 500 g 3   simvastatin (ZOCOR) 40 MG tablet TAKE 1 TABLET ONCE DAILY. 90 tablet 3   traMADol (ULTRAM) 50 MG tablet Take 0.5-1 tablets (25-50 mg total) by mouth every 6 (six) hours as needed for severe pain. 120 tablet 1   triamcinolone (NASACORT) 55 MCG/ACT AERO nasal inhaler Place 2 sprays into the nose as needed.     triamcinolone cream (KENALOG) 0.1 % Apply 1 application. topically 2 (two) times daily. APPLY TOPICALLY TWICE A DAY 45 g 1   VESICARE 10 MG tablet Take 1 tablet (10 mg total) by mouth daily as needed (bladder). 90 tablet 3   No facility-administered medications prior to  visit.    ROS: Review of Systems  Constitutional:  Negative for activity change, appetite change, chills, fatigue and unexpected weight change.  HENT:  Negative for congestion, mouth sores and sinus pressure.   Eyes:  Negative for visual disturbance.  Respiratory:  Negative for cough and chest tightness.   Gastrointestinal:  Negative for abdominal pain and nausea.  Genitourinary:  Negative for difficulty urinating, frequency and vaginal pain.  Musculoskeletal:  Positive for arthralgias, back pain, gait problem and neck stiffness.  Skin:  Negative for pallor and rash.  Neurological:  Negative for dizziness, tremors, weakness, numbness and headaches.  Psychiatric/Behavioral:  Positive for decreased concentration. Negative for confusion, sleep disturbance  and suicidal ideas. The patient is nervous/anxious.     Objective:  BP 118/62 (BP Location: Left Arm, Patient Position: Sitting, Cuff Size: Normal)   Pulse 90   Temp 97.7 F (36.5 C) (Oral)   Ht 5' (1.524 m)   Wt 111 lb 3.2 oz (50.4 kg)   SpO2 96%   BMI 21.72 kg/m   BP Readings from Last 3 Encounters:  07/05/22 118/62  06/25/22 118/60  06/07/22 130/71    Wt Readings from Last 3 Encounters:  07/05/22 111 lb 3.2 oz (50.4 kg)  06/25/22 109 lb 12.8 oz (49.8 kg)  06/07/22 111 lb 12.8 oz (50.7 kg)    Physical Exam Constitutional:      General: She is not in acute distress.    Appearance: Normal appearance. She is well-developed.  HENT:     Head: Normocephalic.     Right Ear: External ear normal.     Left Ear: External ear normal.     Nose: Nose normal.  Eyes:     General:        Right eye: No discharge.        Left eye: No discharge.     Conjunctiva/sclera: Conjunctivae normal.     Pupils: Pupils are equal, round, and reactive to light.  Neck:     Thyroid: No thyromegaly.     Vascular: No JVD.     Trachea: No tracheal deviation.  Cardiovascular:     Rate and Rhythm: Normal rate and regular rhythm.     Heart sounds: Normal heart sounds.  Pulmonary:     Effort: No respiratory distress.     Breath sounds: No stridor. No wheezing.  Abdominal:     General: Bowel sounds are normal. There is no distension.     Palpations: Abdomen is soft. There is no mass.     Tenderness: There is no abdominal tenderness. There is no guarding or rebound.  Musculoskeletal:        General: Tenderness present.     Cervical back: Normal range of motion and neck supple. No rigidity.  Lymphadenopathy:     Cervical: No cervical adenopathy.  Skin:    Findings: No erythema or rash.  Neurological:     Cranial Nerves: No cranial nerve deficit.     Motor: No abnormal muscle tone.     Coordination: Coordination normal.     Deep Tendon Reflexes: Reflexes normal.  Psychiatric:        Behavior:  Behavior normal.        Thought Content: Thought content normal.        Judgment: Judgment normal.    Right buttock is tender.  Both hips are with normal range of motion.  Straight leg elevation is negative bilaterally today.  Lumbar spine is painful with range of motion.  Cervical spine is stiff with very limited range of motion.   Lab Results  Component Value Date   WBC 6.4 06/28/2022   HGB 12.3 06/28/2022   HCT 36.8 06/28/2022   PLT 230.0 06/28/2022   GLUCOSE 93 06/28/2022   CHOL 186 02/17/2022   TRIG 141.0 02/17/2022   HDL 59.30 02/17/2022   LDLDIRECT 98.0 08/13/2021   LDLCALC 99 02/17/2022   ALT 24 06/28/2022   AST 19 06/28/2022   NA 136 06/28/2022   K 4.2 06/28/2022   CL 101 06/28/2022   CREATININE 0.73 06/28/2022   BUN 14 06/28/2022   CO2 27 06/28/2022   TSH 2.35 02/17/2022   HGBA1C 5.5 06/13/2020    ECHOCARDIOGRAM COMPLETE  Result Date: 06/25/2022    ECHOCARDIOGRAM REPORT   Patient Name:   [name] Date of Exam: 06/25/2022 Medical Rec #:  [id number]     Height:       60.0 in Accession #:    [id number]    Weight:       111.8 lb Date of Birth:  1936/07/29      BSA:          1.458 m Patient Age:    50 years      BP:           143/83 mmHg Patient Gender: F             HR:           79 bpm. Exam Location:  Outpatient Procedure: 2D Echo, Cardiac Doppler and Color Doppler Indications:    Aortic stenosis  History:        Patient has prior history of Echocardiogram examinations, most                 recent 02/02/2021. Mitral valve prolapse.  Sonographer:    Jefferey Pica Referring Phys: [id number] DANIEL R BENSIMHON IMPRESSIONS  1. Left ventricular ejection fraction, by estimation, is 65 to 70%. The left ventricle has normal function. The left ventricle has no regional wall motion abnormalities. Left ventricular diastolic parameters are consistent with Grade I diastolic dysfunction (impaired relaxation).  2. Right ventricular systolic function is normal. The right ventricular size is  normal. There is normal pulmonary artery systolic pressure. The estimated right ventricular systolic pressure is 27.2 mmHg.  3. The mitral valve was not well visualized. Mild mitral valve regurgitation.  4. The aortic valve is tricuspid. Aortic valve regurgitation is mild. Aortic valve sclerosis/calcification is present, without any evidence of aortic stenosis. Aortic regurgitation PHT measures 418 msec. Aortic valve mean gradient measures 5.0 mmHg.  5. The inferior vena cava is normal in size with greater than 50% respiratory variability, suggesting right atrial pressure of 3 mmHg. Comparison(s): Changes from prior study are noted. 02/02/2021: LVEF 60-65%, grade 1 DD, mild MR, mild AS. FINDINGS  Left Ventricle: Left ventricular ejection fraction, by estimation, is 65 to 70%. The left ventricle has normal function. The left ventricle has no regional wall motion abnormalities. The left ventricular internal cavity size was normal in size. There is  no left ventricular hypertrophy. Left ventricular diastolic parameters are consistent with Grade I diastolic dysfunction (impaired relaxation). Indeterminate filling pressures. Right Ventricle: The right ventricular size is normal. No increase in right ventricular wall thickness. Right ventricular systolic function is normal. There is normal pulmonary artery systolic pressure. The tricuspid regurgitant velocity is 2.55 m/s, and  with an assumed right atrial pressure of 3 mmHg, the estimated right ventricular systolic pressure  is 29.0 mmHg. Left Atrium: Left atrial size was normal in size. Right Atrium: Right atrial size was normal in size. Pericardium: There is no evidence of pericardial effusion. Mitral Valve: The mitral valve was not well visualized. There is mild calcification of the anterior and posterior mitral valve leaflet(s). Mild mitral valve regurgitation. Tricuspid Valve: The tricuspid valve is grossly normal. Tricuspid valve regurgitation is mild. Aortic Valve:  The aortic valve is tricuspid. Aortic valve regurgitation is mild. Aortic regurgitation PHT measures 418 msec. Aortic valve sclerosis/calcification is present, without any evidence of aortic stenosis. Aortic valve mean gradient measures 5.0  mmHg. Aortic valve peak gradient measures 9.8 mmHg. Pulmonic Valve: The pulmonic valve was grossly normal. Pulmonic valve regurgitation is mild. Aorta: The aortic root and ascending aorta are structurally normal, with no evidence of dilitation. Venous: The inferior vena cava is normal in size with greater than 50% respiratory variability, suggesting right atrial pressure of 3 mmHg. IAS/Shunts: No atrial level shunt detected by color flow Doppler.  LEFT VENTRICLE PLAX 2D LVIDd:         4.20 cm   Diastology LVIDs:         1.80 cm   LV e' medial:    7.18 cm/s LV PW:         0.90 cm   LV E/e' medial:  6.1 LV IVS:        0.80 cm   LV e' lateral:   6.53 cm/s LVOT diam:     2.00 cm   LV E/e' lateral: 6.7 LV SV:         59 LV SV Index:   41 LVOT Area:     3.14 cm  RIGHT VENTRICLE             IVC RV Basal diam:  2.60 cm     IVC diam: 1.50 cm RV S prime:     13.20 cm/s TAPSE (M-mode): 1.5 cm LEFT ATRIUM           Index        RIGHT ATRIUM           Index LA diam:      3.00 cm 2.06 cm/m   RA Area:     12.70 cm LA Vol (A2C): 29.1 ml 19.96 ml/m  RA Volume:   28.10 ml  19.28 ml/m LA Vol (A4C): 15.5 ml 10.63 ml/m  AORTIC VALVE                 PULMONIC VALVE AV Area (Vmax): 1.97 cm     PV Vmax:       0.64 m/s AV Vmax:        156.67 cm/s  PV Peak grad:  1.6 mmHg AV Peak Grad:   9.8 mmHg AV Mean Grad:   5.0 mmHg LVOT Vmax:      98.40 cm/s LVOT Vmean:     61.950 cm/s LVOT VTI:       0.188 m AI PHT:         418 msec  AORTA Ao Root diam: 3.00 cm Ao Asc diam:  3.10 cm MITRAL VALVE               TRICUSPID VALVE MV Area (PHT): 3.21 cm    TR Peak grad:   26.0 mmHg MV Decel Time: 236 msec    TR Vmax:        255.00 cm/s MV E velocity: 43.50 cm/s MV A velocity: 86.50 cm/s  SHUNTS MV  E/A ratio:   0.50        Systemic VTI:  0.19 m                            Systemic Diam: 2.00 cm Lyman Bishop MD Electronically signed by Lyman Bishop MD Signature Date/Time: 06/25/2022/4:03:41 PM    Final     Assessment & Plan:   Problem List Items Addressed This Visit     Anxiety    Lorazepam prn, - tolerance has developed  Potential benefits of a long term benzodiazepines  use as well as potential risks  and complications were explained to the patient and were aknowledged.      Cervical spine instability    S/p titanium rods/screws fusion. Should be ok for an MRI - will check w/Dr Dawley      Hypothyroidism    On Levothroid brand      Low back pain    Worse LS MRI pending S/p cervical rods/screws fusion. Should be ok for an LS spine MRI - will check w/Dr Dawley On Gabapentin, Tramadol prn      Relevant Orders   CBC with Differential/Platelet   Comprehensive metabolic panel      No orders of the defined types were placed in this encounter.     Follow-up: Return in about 3 months (around 10/05/2022) for a follow-up visit.  Walker Kehr, MD

## 2022-07-05 NOTE — Assessment & Plan Note (Signed)
Worse LS MRI pending S/p cervical rods/screws fusion. Should be ok for an LS spine MRI - will check w/Dr Dawley On Gabapentin, Tramadol prn

## 2022-07-05 NOTE — Telephone Encounter (Signed)
Patient advised and verbalized understanding 

## 2022-07-05 NOTE — Assessment & Plan Note (Signed)
S/p titanium rods/screws fusion. Should be ok for an MRI - will check w/Dr Dawley

## 2022-07-06 ENCOUNTER — Other Ambulatory Visit: Payer: Self-pay | Admitting: Internal Medicine

## 2022-07-06 ENCOUNTER — Ambulatory Visit: Payer: Medicare Other | Admitting: Internal Medicine

## 2022-07-06 MED ORDER — TRAMADOL HCL 50 MG PO TABS: 25.0000 mg | ORAL_TABLET | Freq: Four times a day (QID) | ORAL | 3 refills | Status: DC | PRN

## 2022-07-06 NOTE — Assessment & Plan Note (Signed)
Lorazepam prn, - tolerance has developed  Potential benefits of a long term benzodiazepines  use as well as potential risks  and complications were explained to the patient and were aknowledged. 

## 2022-07-08 ENCOUNTER — Emergency Department (HOSPITAL_BASED_OUTPATIENT_CLINIC_OR_DEPARTMENT_OTHER): Payer: Medicare Other | Admitting: Radiology

## 2022-07-08 ENCOUNTER — Emergency Department (HOSPITAL_BASED_OUTPATIENT_CLINIC_OR_DEPARTMENT_OTHER)
Admission: EM | Admit: 2022-07-08 | Discharge: 2022-07-08 | Disposition: A | Discharge status: Home / Self Care | Payer: Medicare Other | Attending: Emergency Medicine | Admitting: Emergency Medicine

## 2022-07-08 ENCOUNTER — Other Ambulatory Visit: Payer: Self-pay

## 2022-07-08 ENCOUNTER — Encounter (HOSPITAL_BASED_OUTPATIENT_CLINIC_OR_DEPARTMENT_OTHER): Payer: Self-pay | Admitting: Emergency Medicine

## 2022-07-08 ENCOUNTER — Emergency Department (HOSPITAL_BASED_OUTPATIENT_CLINIC_OR_DEPARTMENT_OTHER): Payer: Medicare Other

## 2022-07-08 DIAGNOSIS — W19XXXA Unspecified fall, initial encounter: Secondary | ICD-10-CM | POA: Diagnosis not present

## 2022-07-08 DIAGNOSIS — S80211A Abrasion, right knee, initial encounter: Secondary | ICD-10-CM | POA: Insufficient documentation

## 2022-07-08 DIAGNOSIS — M25561 Pain in right knee: Secondary | ICD-10-CM | POA: Diagnosis not present

## 2022-07-08 DIAGNOSIS — S81011A Laceration without foreign body, right knee, initial encounter: Secondary | ICD-10-CM | POA: Diagnosis not present

## 2022-07-08 DIAGNOSIS — S80919A Unspecified superficial injury of unspecified knee, initial encounter: Secondary | ICD-10-CM | POA: Diagnosis not present

## 2022-07-08 DIAGNOSIS — S0181XA Laceration without foreign body of other part of head, initial encounter: Secondary | ICD-10-CM | POA: Insufficient documentation

## 2022-07-08 DIAGNOSIS — S0990XA Unspecified injury of head, initial encounter: Secondary | ICD-10-CM | POA: Diagnosis not present

## 2022-07-08 DIAGNOSIS — W010XXA Fall on same level from slipping, tripping and stumbling without subsequent striking against object, initial encounter: Secondary | ICD-10-CM | POA: Insufficient documentation

## 2022-07-08 DIAGNOSIS — R519 Headache, unspecified: Secondary | ICD-10-CM | POA: Diagnosis not present

## 2022-07-08 DIAGNOSIS — S199XXA Unspecified injury of neck, initial encounter: Secondary | ICD-10-CM | POA: Diagnosis not present

## 2022-07-08 NOTE — ED Provider Notes (Addendum)
[facility] Provider Note   CSN: [id number] Arrival date & time: 07/08/22  2111     History  Chief Complaint  Patient presents with   Fall   Laceration    [name] is a 86 y.o. female.  Patient here after mechanical fall.  Had some wine tonight.  Patient was walking and tripped over her feet and fell and hit the front of her face.  Has a laceration to the left side of her forehead.  Has abrasion to her right knee.  She has been ambulatory since the fall.  Nothing makes it worse or better.  She is not on blood thinners.  No major medical problems.  No neck pain.  No headache.  No other extremity pain.  The history is provided by the patient.       Home Medications Prior to Admission medications   Medication Sig Start Date End Date Taking? Authorizing Provider  acetaminophen (TYLENOL) 500 MG tablet Take 1,000 mg by mouth every 6 (six) hours as needed for mild pain.    [provider]  albuterol (VENTOLIN HFA) 108 (90 Base) MCG/ACT inhaler Take 2 puffs every 4-6 hours as needed 10/26/21   [provider]  Azelastine HCl 0.15 % SOLN Place into both nostrils. Place into both nostrils. 10/26/21   [provider]  BLUE-EMU MAXIMUM STRENGTH 2.5 % LIQD Use bid 02/22/22   Plotnikov, Evie Lacks, MD  Calcium Carbonate-Vitamin D (CALCIUM-VITAMIN D) 500-200 MG-UNIT per tablet Take 3 tablets by mouth daily.    [provider]  clobetasol (TEMOVATE) 0.05 % external solution Apply 1 application topically 2 (two) times daily as needed (on scalp). 08/19/21   Plotnikov, Evie Lacks, MD  desonide (DESOWEN) 0.05 % cream Apply 1 application topically as needed.    [provider]  diclofenac Sodium (VOLTAREN) 1 % GEL APPLY 4 GRAMS 4 TIMES DAILY. 05/18/21   Plotnikov, Evie Lacks, MD  EPINEPHrine (EPIPEN 2-PAK) 0.3 mg/0.3 mL IJ SOAJ injection Inject 0.3 mLs (0.3 mg total) into the muscle as needed. 12/20/18   Plotnikov, Evie Lacks, MD   estradiol (ESTRACE) 0.1 MG/GM vaginal cream Place 1 gram vaginally 2 x a week at hs 08/07/21   Salvadore Dom, MD  famotidine (PEPCID) 40 MG tablet Take 1 tablet (40 mg total) by mouth at bedtime. NEEDS OFFICE VISIT FOR ADDITIONAL REFILLS 08/03/21   Zehr, Janett Billow D, PA-C  gabapentin (NEURONTIN) 100 MG capsule Take 100 mg by mouth as needed. 08/05/21   [provider]  ipratropium (ATROVENT) 0.06 % nasal spray Place 1 spray into the nose 3 (three) times daily as needed for rhinitis. 06/27/14   Plotnikov, Evie Lacks, MD  levocetirizine (XYZAL) 5 MG tablet Take 5 mg by mouth every evening.    [provider]  levothyroxine (SYNTHROID) 50 MCG tablet TAKE ONE TABLET BY MOUTH DAILY BEFORE BREAKFAST. 03/15/22   Plotnikov, Evie Lacks, MD  LORazepam (ATIVAN) 1 MG tablet Take 2-3 tablets at bedtime and 1-2 tablets in the daytime as needed. 05/31/22   Plotnikov, Evie Lacks, MD  methocarbamol (ROBAXIN) 500 MG tablet Take 1 tablet (500 mg total) by mouth at bedtime as needed for muscle spasms. 02/22/22   Plotnikov, Evie Lacks, MD  montelukast (SINGULAIR) 10 MG tablet Take 1 tablet (10 mg total) by mouth at bedtime. 03/30/21   Plotnikov, Evie Lacks, MD  pantoprazole (PROTONIX) 40 MG tablet TAKE (1) TABLET TWICE A DAY BEFORE MEALS. Patient taking differently: as needed. 12/14/21  Plotnikov, Evie Lacks, MD  polyethylene glycol powder (GLYCOLAX/MIRALAX) 17 GM/SCOOP powder Take 17 g by mouth 2 (two) times daily as needed for moderate constipation. 03/29/22   Plotnikov, Evie Lacks, MD  simvastatin (ZOCOR) 40 MG tablet TAKE 1 TABLET ONCE DAILY. 10/01/21   Plotnikov, Evie Lacks, MD  traMADol (ULTRAM) 50 MG tablet Take 0.5-1 tablets (25-50 mg total) by mouth every 6 (six) hours as needed for severe pain. 07/06/22   Plotnikov, Evie Lacks, MD  triamcinolone (NASACORT) 55 MCG/ACT AERO nasal inhaler Place 2 sprays into the nose as needed.    [provider]  triamcinolone cream (KENALOG) 0.1 % Apply 1 application.  topically 2 (two) times daily. APPLY TOPICALLY TWICE A DAY 02/22/22   Plotnikov, Evie Lacks, MD  VESICARE 10 MG tablet Take 1 tablet (10 mg total) by mouth daily as needed (bladder). 03/29/22   Plotnikov, Evie Lacks, MD      Allergies    Bee venom, Bacitracin-polymyxin b, Clarithromycin, Doxycycline, Neosporin [neomycin-bacitracin zn-polymyx], and Hydrocortisone    Review of Systems   Review of Systems  Physical Exam Updated Vital Signs BP (!) 141/71 (BP Location: Right Arm)   Pulse 68   Temp 97.9 F (36.6 C) (Oral)   Resp 16   Ht 5' (1.524 m)   Wt 49.9 kg   SpO2 96%   BMI 21.48 kg/m  Physical Exam Vitals and nursing note reviewed.  Constitutional:      General: She is not in acute distress.    Appearance: She is well-developed. She is not ill-appearing.  HENT:     Head: Normocephalic and atraumatic.     Nose: Nose normal.     Mouth/Throat:     Mouth: Mucous membranes are moist.  Eyes:     Extraocular Movements: Extraocular movements intact.     Conjunctiva/sclera: Conjunctivae normal.     Pupils: Pupils are equal, round, and reactive to light.  Cardiovascular:     Rate and Rhythm: Normal rate and regular rhythm.     Pulses: Normal pulses.     Heart sounds: Normal heart sounds. No murmur heard. Pulmonary:     Effort: Pulmonary effort is normal. No respiratory distress.     Breath sounds: Normal breath sounds.  Abdominal:     Palpations: Abdomen is soft.     Tenderness: There is no abdominal tenderness.  Musculoskeletal:        General: No swelling or tenderness.     Cervical back: Normal range of motion and neck supple. No tenderness.  Skin:    General: Skin is warm and dry.     Capillary Refill: Capillary refill takes less than 2 seconds.     Comments: 4 cm laceration to left side of the forehead is hemostatic fairly superficial, right knee abrasion   Neurological:     General: No focal deficit present.     Mental Status: She is alert and oriented to person, place,  and time.     Cranial Nerves: No cranial nerve deficit.     Sensory: No sensory deficit.     Motor: No weakness.     Coordination: Coordination normal.  Psychiatric:        Mood and Affect: Mood normal.     ED Results / Procedures / Treatments   Labs (all labs ordered are listed, but only abnormal results are displayed) Labs Reviewed - No data to display  EKG None  Radiology CT Head Wo Contrast  Result Date: 07/08/2022 CLINICAL DATA:  Head trauma, moderate-severe; Neck trauma (Age >= 65y). PTA pt fell hitting head HX spinal fusion EXAM: CT HEAD WITHOUT CONTRAST CT CERVICAL SPINE WITHOUT CONTRAST TECHNIQUE: Multidetector CT imaging of the head and cervical spine was performed following the standard protocol without intravenous contrast. Multiplanar CT image reconstructions of the cervical spine were also generated. RADIATION DOSE REDUCTION: This exam was performed according to the departmental dose-optimization program which includes automated exposure control, adjustment of the mA and/or kV according to patient size and/or use of iterative reconstruction technique. COMPARISON:  MRI head 07/11/2019 FINDINGS: CT HEAD FINDINGS BRAIN: BRAIN Cerebral ventricle sizes are concordant with the degree of cerebral volume loss. No evidence of large-territorial acute infarction. No parenchymal hemorrhage. No mass lesion. No extra-axial collection. Redemonstration of a 1.7 x 1.1 cm similar-appearing calcified meningioma along the left frontal calvarial convexity. No mass effect or midline shift. No hydrocephalus. Basilar cisterns are patent. Vascular: No hyperdense vessel. Skull: No acute fracture or focal lesion. Sinuses/Orbits: Paranasal sinuses and mastoid air cells are clear. Bilateral lens replacement. Otherwise the orbits are unremarkable. Other: None. CT CERVICAL SPINE FINDINGS Alignment: Grade 1 anterolisthesis C2 on C3 and C3 on C4. Straightening of the normal cervical lordosis likely due to  positioning, surgical hardware, and degenerative changes. Skull base and vertebrae: Craniocervical surgical hardware. No CT findings suggest surgical hardware complication. Multilevel severe degenerative changes spine. No acute fracture. No aggressive appearing focal osseous lesion or focal pathologic process. Soft tissues and spinal canal: No prevertebral fluid or swelling. No visible canal hematoma. Upper chest: Biapical pleural/pulmonary scarring. Other: None. IMPRESSION: 1. No acute intracranial abnormality in a patient with a known stable left frontal calcified meningioma. 2. No acute displaced fracture or traumatic listhesis of the cervical spine. Electronically Signed   By: Iven Finn M.D.   On: 07/08/2022 21:51   CT Cervical Spine Wo Contrast  Result Date: 07/08/2022 CLINICAL DATA:  Head trauma, moderate-severe; Neck trauma (Age >= 65y). PTA pt fell hitting head HX spinal fusion EXAM: CT HEAD WITHOUT CONTRAST CT CERVICAL SPINE WITHOUT CONTRAST TECHNIQUE: Multidetector CT imaging of the head and cervical spine was performed following the standard protocol without intravenous contrast. Multiplanar CT image reconstructions of the cervical spine were also generated. RADIATION DOSE REDUCTION: This exam was performed according to the departmental dose-optimization program which includes automated exposure control, adjustment of the mA and/or kV according to patient size and/or use of iterative reconstruction technique. COMPARISON:  MRI head 07/11/2019 FINDINGS: CT HEAD FINDINGS BRAIN: BRAIN Cerebral ventricle sizes are concordant with the degree of cerebral volume loss. No evidence of large-territorial acute infarction. No parenchymal hemorrhage. No mass lesion. No extra-axial collection. Redemonstration of a 1.7 x 1.1 cm similar-appearing calcified meningioma along the left frontal calvarial convexity. No mass effect or midline shift. No hydrocephalus. Basilar cisterns are patent. Vascular: No hyperdense  vessel. Skull: No acute fracture or focal lesion. Sinuses/Orbits: Paranasal sinuses and mastoid air cells are clear. Bilateral lens replacement. Otherwise the orbits are unremarkable. Other: None. CT CERVICAL SPINE FINDINGS Alignment: Grade 1 anterolisthesis C2 on C3 and C3 on C4. Straightening of the normal cervical lordosis likely due to positioning, surgical hardware, and degenerative changes. Skull base and vertebrae: Craniocervical surgical hardware. No CT findings suggest surgical hardware complication. Multilevel severe degenerative changes spine. No acute fracture. No aggressive appearing focal osseous lesion or focal pathologic process. Soft tissues and spinal canal: No prevertebral fluid or swelling. No visible canal hematoma. Upper chest: Biapical pleural/pulmonary scarring. Other: None. IMPRESSION: 1. No  acute intracranial abnormality in a patient with a known stable left frontal calcified meningioma. 2. No acute displaced fracture or traumatic listhesis of the cervical spine. Electronically Signed   By: Iven Finn M.D.   On: 07/08/2022 21:51   DG Knee Complete 4 Views Right  Result Date: 07/08/2022 CLINICAL DATA:  Mechanical fall.  Skin tear of the right knee. EXAM: RIGHT KNEE - COMPLETE 4 VIEW COMPARISON:  None Available. FINDINGS: Right knee is located. No effusion is present. No acute fractures are present. Extensive chondrocalcinosis is noted. IMPRESSION: 1. No acute abnormality. 2. Extensive chondrocalcinosis. Question CPPD. Electronically Signed   By: San Morelle M.D.   On: 07/08/2022 21:47    Procedures .Marland KitchenLaceration Repair  Date/Time: 07/08/2022 10:01 PM  Performed by: Lennice Sites, DO Authorized by: Lennice Sites, DO   Consent:    Consent obtained:  Verbal   Consent given by:  Patient   Risks, benefits, and alternatives were discussed: yes     Risks discussed:  Infection, need for additional repair, nerve damage, poor wound healing, poor cosmetic result, pain,  tendon damage, retained foreign body and vascular damage   Alternatives discussed:  No treatment Universal protocol:    Procedure explained and questions answered to patient or proxy's satisfaction: yes     Relevant documents present and verified: yes     Test results available: yes     Patient identity confirmed:  Verbally with patient Anesthesia:    Anesthesia method:  None Laceration details:    Location: forhead.   Length (cm):  4   Depth (mm):  2 Pre-procedure details:    Preparation:  Patient was prepped and draped in usual sterile fashion Exploration:    Limited defect created (wound extended): no     Imaging outcome: foreign body not noted     Wound exploration: wound explored through full range of motion and entire depth of wound visualized     Wound extent: no areolar tissue violation noted, no fascia violation noted, no foreign bodies/material noted, no muscle damage noted, no nerve damage noted, no tendon damage noted, no underlying fracture noted and no vascular damage noted     Contaminated: no   Treatment:    Area cleansed with:  Shur-Clens   Amount of cleaning:  Extensive Skin repair:    Repair method:  Steri-Strips and tissue adhesive   Number of Steri-Strips:  6 Approximation:    Approximation:  Close Repair type:    Repair type:  Simple Post-procedure details:    Dressing:  Open (no dressing)   Procedure completion:  Tolerated     Medications Ordered in ED Medications - No data to display  ED Course/ Medical Decision Making/ A&P                           Medical Decision Making Amount and/or Complexity of Data Reviewed Radiology: ordered.   [name] is here after fall.  Mechanical fall.  Normal vitals.  No fever.  Not on blood thinners.  Laceration to left side of the forehead that was repaired with Dermabond and Steri-Strips.  She states that her tetanus shot is up-to-date.  She has abrasions to the right knee.  She has been ambulatory since  the fall.  Will get a CT scan of the head, neck, x-ray of the right knee.  She is neurovascular neuromuscular intact.  Neurologically she is intact.  Well-appearing.  Head CT and neck CT  unremarkable.  No acute injuries.  X-ray of the right knee shows no fracture or malalignment.  Recommend ice, Tylenol, ibuprofen.  Wound care instructions given.  Patient discharged in good condition.  This chart was dictated using voice recognition software.  Despite best efforts to proofread,  errors can occur which can change the documentation meaning.         Final Clinical Impression(s) / ED Diagnoses Final diagnoses:  Laceration of forehead, initial encounter  Abrasion of right knee, initial encounter    Rx / DC Orders ED Discharge Orders     None         Lennice Sites, DO 07/08/22 Crowley, Parker, DO 07/08/22 2202

## 2022-07-08 NOTE — ED Triage Notes (Signed)
  Patient BIB EMS after mechanical fall at living facility.  Patient tripped over something in floor and hit head causing approximately 1 inch laceration above left eye.  Patient also has quarter sized skin tear on R knee.  No LOC and patient does not take blood thinners.  No pain at this time.

## 2022-07-08 NOTE — Discharge Instructions (Signed)
Please keep your laceration site clean and dry.  Do not get any water or soap or scrub aggressively in this area for at least the next 3 to 4 days.  Dermabond and Steri-Strips will fall off on their own.  For your right knee abrasion you can use Neosporin or bacitracin twice a day.  Your images today show no acute injuries.  Overall suspect bone bruise to your right knee.

## 2022-07-12 ENCOUNTER — Emergency Department (HOSPITAL_BASED_OUTPATIENT_CLINIC_OR_DEPARTMENT_OTHER)
Admission: EM | Admit: 2022-07-12 | Discharge: 2022-07-12 | Disposition: A | Discharge status: Home / Self Care | Payer: Medicare Other | Attending: Emergency Medicine | Admitting: Emergency Medicine

## 2022-07-12 ENCOUNTER — Encounter (HOSPITAL_BASED_OUTPATIENT_CLINIC_OR_DEPARTMENT_OTHER): Payer: Self-pay

## 2022-07-12 ENCOUNTER — Other Ambulatory Visit: Payer: Self-pay

## 2022-07-12 DIAGNOSIS — Z79899 Other long term (current) drug therapy: Secondary | ICD-10-CM | POA: Diagnosis not present

## 2022-07-12 DIAGNOSIS — Z5189 Encounter for other specified aftercare: Secondary | ICD-10-CM

## 2022-07-12 DIAGNOSIS — Z4801 Encounter for change or removal of surgical wound dressing: Secondary | ICD-10-CM | POA: Diagnosis not present

## 2022-07-12 DIAGNOSIS — Z4800 Encounter for change or removal of nonsurgical wound dressing: Secondary | ICD-10-CM | POA: Diagnosis not present

## 2022-07-12 NOTE — ED Provider Notes (Signed)
[facility] Provider Note   CSN: [id number] Arrival date & time: 07/12/22  1038     History  Chief Complaint  Patient presents with   Follow-up    [name] is a 86 y.o. female.  86 year old female presents today for follow-up visit following a fall that occurred 4 days ago.  She had a laceration to her forehead that was repaired with Steri-Strips, Dermabond.  She also had an abrasion to her right knee.  Patient reports no issues since being discharged.  States she has been ambulating without difficulty.  Denies nausea, vomiting, lightheadedness, headache or other complaints.  She states she was told to return in about 3 to 4 days to have this reevaluated.  She denies any fever, erythema, drainage from the wounds.  The history is provided by the patient. No language interpreter was used.       Home Medications Prior to Admission medications   Medication Sig Start Date End Date Taking? Authorizing Provider  acetaminophen (TYLENOL) 500 MG tablet Take 1,000 mg by mouth every 6 (six) hours as needed for mild pain.    [provider]  albuterol (VENTOLIN HFA) 108 (90 Base) MCG/ACT inhaler Take 2 puffs every 4-6 hours as needed 10/26/21   [provider]  Azelastine HCl 0.15 % SOLN Place into both nostrils. Place into both nostrils. 10/26/21   [provider]  BLUE-EMU MAXIMUM STRENGTH 2.5 % LIQD Use bid 02/22/22   Plotnikov, Evie Lacks, MD  Calcium Carbonate-Vitamin D (CALCIUM-VITAMIN D) 500-200 MG-UNIT per tablet Take 3 tablets by mouth daily.    [provider]  clobetasol (TEMOVATE) 0.05 % external solution Apply 1 application topically 2 (two) times daily as needed (on scalp). 08/19/21   Plotnikov, Evie Lacks, MD  desonide (DESOWEN) 0.05 % cream Apply 1 application topically as needed.    [provider]  diclofenac Sodium (VOLTAREN) 1 % GEL APPLY 4 GRAMS 4 TIMES DAILY. 05/18/21   Plotnikov, Evie Lacks, MD   EPINEPHrine (EPIPEN 2-PAK) 0.3 mg/0.3 mL IJ SOAJ injection Inject 0.3 mLs (0.3 mg total) into the muscle as needed. 12/20/18   Plotnikov, Evie Lacks, MD  estradiol (ESTRACE) 0.1 MG/GM vaginal cream Place 1 gram vaginally 2 x a week at hs 08/07/21   Salvadore Dom, MD  famotidine (PEPCID) 40 MG tablet Take 1 tablet (40 mg total) by mouth at bedtime. NEEDS OFFICE VISIT FOR ADDITIONAL REFILLS 08/03/21   Zehr, Janett Billow D, PA-C  gabapentin (NEURONTIN) 100 MG capsule Take 100 mg by mouth as needed. 08/05/21   [provider]  ipratropium (ATROVENT) 0.06 % nasal spray Place 1 spray into the nose 3 (three) times daily as needed for rhinitis. 06/27/14   Plotnikov, Evie Lacks, MD  levocetirizine (XYZAL) 5 MG tablet Take 5 mg by mouth every evening.    [provider]  levothyroxine (SYNTHROID) 50 MCG tablet TAKE ONE TABLET BY MOUTH DAILY BEFORE BREAKFAST. 03/15/22   Plotnikov, Evie Lacks, MD  LORazepam (ATIVAN) 1 MG tablet Take 2-3 tablets at bedtime and 1-2 tablets in the daytime as needed. 05/31/22   Plotnikov, Evie Lacks, MD  methocarbamol (ROBAXIN) 500 MG tablet Take 1 tablet (500 mg total) by mouth at bedtime as needed for muscle spasms. 02/22/22   Plotnikov, Evie Lacks, MD  montelukast (SINGULAIR) 10 MG tablet Take 1 tablet (10 mg total) by mouth at bedtime. 03/30/21   Plotnikov, Evie Lacks, MD  pantoprazole (PROTONIX) 40 MG tablet TAKE (1) TABLET TWICE A  DAY BEFORE MEALS. Patient taking differently: as needed. 12/14/21   Plotnikov, Evie Lacks, MD  polyethylene glycol powder (GLYCOLAX/MIRALAX) 17 GM/SCOOP powder Take 17 g by mouth 2 (two) times daily as needed for moderate constipation. 03/29/22   Plotnikov, Evie Lacks, MD  simvastatin (ZOCOR) 40 MG tablet TAKE 1 TABLET ONCE DAILY. 10/01/21   Plotnikov, Evie Lacks, MD  traMADol (ULTRAM) 50 MG tablet Take 0.5-1 tablets (25-50 mg total) by mouth every 6 (six) hours as needed for severe pain. 07/06/22   Plotnikov, Evie Lacks, MD  triamcinolone (NASACORT) 55  MCG/ACT AERO nasal inhaler Place 2 sprays into the nose as needed.    [provider]  triamcinolone cream (KENALOG) 0.1 % Apply 1 application. topically 2 (two) times daily. APPLY TOPICALLY TWICE A DAY 02/22/22   Plotnikov, Evie Lacks, MD  VESICARE 10 MG tablet Take 1 tablet (10 mg total) by mouth daily as needed (bladder). 03/29/22   Plotnikov, Evie Lacks, MD      Allergies    Bee venom, Bacitracin-polymyxin b, Clarithromycin, Doxycycline, Neosporin [neomycin-bacitracin zn-polymyx], and Hydrocortisone    Review of Systems   Review of Systems  Constitutional:  Negative for fever.  Eyes:  Negative for visual disturbance.  Gastrointestinal:  Negative for nausea and vomiting.  Skin:  Positive for wound.  Neurological:  Negative for syncope, weakness, light-headedness and headaches.  All other systems reviewed and are negative.   Physical Exam Updated Vital Signs BP 126/77 (BP Location: Right Arm)   Pulse 96   Temp 98.5 F (36.9 C)   Resp 14   SpO2 96%  Physical Exam Vitals and nursing note reviewed.  Constitutional:      General: She is not in acute distress.    Appearance: Normal appearance. She is not ill-appearing.  HENT:     Head: Normocephalic and atraumatic.     Nose: Nose normal.  Eyes:     Extraocular Movements: Extraocular movements intact.     Conjunctiva/sclera: Conjunctivae normal.     Pupils: Pupils are equal, round, and reactive to light.  Pulmonary:     Effort: Pulmonary effort is normal. No respiratory distress.  Musculoskeletal:        General: No deformity.  Skin:    Findings: No rash.  Neurological:     Mental Status: She is alert.     Comments: Full range of motion in all extremities with 5/5 strength in extensor and flexor muscle groups..  Cranial nerves III through XII intact.  Pupils equal round reactive to light.  EOMs intact.     ED Results / Procedures / Treatments   Labs (all labs ordered are listed, but only abnormal results are  displayed) Labs Reviewed - No data to display  EKG None  Radiology No results found.  Procedures Procedures    Medications Ordered in ED Medications - No data to display  ED Course/ Medical Decision Making/ A&P                           Medical Decision Making  86 year old female returns for follow-up.  Patient had a fall and a laceration to her left forehead, abrasion to right knee.  X-ray, CT imaging of the head and cervical spine from that visit were without acute concern.  Patient's laceration appears to be healing well.  Steri-Strips still in place.  Discussed management of the wound.  It is appropriate for discharge.  Discharged in stable condition.  Neurological exam  without focal deficits.  Without any complaints since being discharged 4 days ago.   Final Clinical Impression(s) / ED Diagnoses Final diagnoses:  Visit for wound check    Rx / DC Orders ED Discharge Orders     None         Evlyn Courier, PA-C 07/12/22 1143    Tegeler, Gwenyth Allegra, MD 07/12/22 1201

## 2022-07-12 NOTE — ED Triage Notes (Signed)
Pt presents POV from [place] for a follow up from a fall Thursday. Pt seen here for fall, steri strips and bandage placed during that visit. Discharge instructions suggested pt to f[facility]u at [facility] in 3-4 days.  Pt is scheduled for an MRI tomorrow and is requesting to have the f/u visit today to make sure everything is okay

## 2022-07-12 NOTE — Discharge Instructions (Addendum)
Your exam today was overall reassuring.  Continue to keep the Steri-Strips on until they fall off on their own.  If you have any worsening headache, lightheadedness, nausea, vomiting please return for evaluation.  Otherwise follow-up with your primary care provider as you need to.  I have attached information on how to care for wounds that have been repaired with Dermabond.

## 2022-07-13 ENCOUNTER — Ambulatory Visit
Admission: RE | Admit: 2022-07-13 | Discharge: 2022-07-13 | Disposition: A | Discharge status: Home / Self Care | Payer: Medicare Other | Source: Ambulatory Visit | Attending: Neurological Surgery | Admitting: Neurological Surgery

## 2022-07-13 DIAGNOSIS — M48061 Spinal stenosis, lumbar region without neurogenic claudication: Secondary | ICD-10-CM | POA: Diagnosis not present

## 2022-07-13 DIAGNOSIS — M4316 Spondylolisthesis, lumbar region: Secondary | ICD-10-CM | POA: Diagnosis not present

## 2022-07-13 DIAGNOSIS — M545 Low back pain, unspecified: Secondary | ICD-10-CM | POA: Diagnosis not present

## 2022-07-13 DIAGNOSIS — M4807 Spinal stenosis, lumbosacral region: Secondary | ICD-10-CM | POA: Diagnosis not present

## 2022-07-13 DIAGNOSIS — M5416 Radiculopathy, lumbar region: Secondary | ICD-10-CM

## 2022-07-19 DIAGNOSIS — M5416 Radiculopathy, lumbar region: Secondary | ICD-10-CM | POA: Diagnosis not present

## 2022-07-21 ENCOUNTER — Encounter: Payer: Self-pay | Admitting: Internal Medicine

## 2022-07-21 ENCOUNTER — Ambulatory Visit (INDEPENDENT_AMBULATORY_CARE_PROVIDER_SITE_OTHER): Payer: Medicare Other | Admitting: Internal Medicine

## 2022-07-21 DIAGNOSIS — S0180XD Unspecified open wound of other part of head, subsequent encounter: Secondary | ICD-10-CM

## 2022-07-21 DIAGNOSIS — G8929 Other chronic pain: Secondary | ICD-10-CM

## 2022-07-21 DIAGNOSIS — M545 Low back pain, unspecified: Secondary | ICD-10-CM

## 2022-07-21 DIAGNOSIS — S0180XA Unspecified open wound of other part of head, initial encounter: Secondary | ICD-10-CM | POA: Insufficient documentation

## 2022-07-21 DIAGNOSIS — G4700 Insomnia, unspecified: Secondary | ICD-10-CM

## 2022-07-21 DIAGNOSIS — F439 Reaction to severe stress, unspecified: Secondary | ICD-10-CM | POA: Diagnosis not present

## 2022-07-21 NOTE — Assessment & Plan Note (Addendum)
[name] had an MRI w/ Dr Red Christians dx w/ pinch nerve - on Gabapentin. She may need to reduce gabapentin to nighttime only due to lightheadedness

## 2022-07-21 NOTE — Progress Notes (Signed)
Subjective:  Patient ID: [name], female    DOB: Jan 21, 1936  Age: 86 y.o. MRN: [id number]  CC: Follow-up (ER F/U Pt states she fell last thurs.. went to [facility] has a scar/cut over (L) eye) and Pinched nerve (Had a MRI w/ Dr Red Christians dx w/ pinch nerve)   HPI [name] presents for ER F/U Pt states she fell last thurs.. went to [facility] has a scar/cut over (L) eye) and Pinched nerve (  C/o L forehead strips  Per hx: "[name] is here after fall.  Mechanical fall.  Normal vitals.  No fever.  Not on blood thinners.  Laceration to left side of the forehead that was repaired with Dermabond and Steri-Strips.  She states that her tetanus shot is up-to-date.  She has abrasions to the right knee.  She has been ambulatory since the fall.  Will get a CT scan of the head, neck, x-ray of the right knee.  She is neurovascular neuromuscular intact.  Neurologically she is intact.  Well-appearing.   Head CT and neck CT unremarkable.  No acute injuries.  X-ray of the right knee shows no fracture or malalignment.  Recommend ice, Tylenol, ibuprofen.  Wound care instructions given.  Patient discharged in good condition"  Outpatient Medications Prior to Visit  Medication Sig Dispense Refill   acetaminophen (TYLENOL) 500 MG tablet Take 1,000 mg by mouth every 6 (six) hours as needed for mild pain.     albuterol (VENTOLIN HFA) 108 (90 Base) MCG/ACT inhaler Take 2 puffs every 4-6 hours as needed     Azelastine HCl 0.15 % SOLN Place into both nostrils. Place into both nostrils.     BLUE-EMU MAXIMUM STRENGTH 2.5 % LIQD Use bid 60 mL 3   Calcium Carbonate-Vitamin D (CALCIUM-VITAMIN D) 500-200 MG-UNIT per tablet Take 3 tablets by mouth daily.     clobetasol (TEMOVATE) 0.05 % external solution Apply 1 application topically 2 (two) times daily as needed (on scalp). 50 mL 1   desonide (DESOWEN) 0.05 % cream Apply 1 application topically as needed.     diclofenac Sodium (VOLTAREN) 1 % GEL APPLY 4 GRAMS 4  TIMES DAILY. 200 g 3   EPINEPHrine (EPIPEN 2-PAK) 0.3 mg/0.3 mL IJ SOAJ injection Inject 0.3 mLs (0.3 mg total) into the muscle as needed. 1 Device 1   estradiol (ESTRACE) 0.1 MG/GM vaginal cream Place 1 gram vaginally 2 x a week at hs 42.5 g 1   famotidine (PEPCID) 40 MG tablet Take 1 tablet (40 mg total) by mouth at bedtime. NEEDS OFFICE VISIT FOR ADDITIONAL REFILLS 30 tablet 2   gabapentin (NEURONTIN) 100 MG capsule Take 100 mg by mouth as needed.     ipratropium (ATROVENT) 0.06 % nasal spray Place 1 spray into the nose 3 (three) times daily as needed for rhinitis. 15 mL 0   levocetirizine (XYZAL) 5 MG tablet Take 5 mg by mouth every evening.     levothyroxine (SYNTHROID) 50 MCG tablet TAKE ONE TABLET BY MOUTH DAILY BEFORE BREAKFAST. 30 tablet 5   LORazepam (ATIVAN) 1 MG tablet Take 2-3 tablets at bedtime and 1-2 tablets in the daytime as needed. 150 tablet 2   methocarbamol (ROBAXIN) 500 MG tablet Take 1 tablet (500 mg total) by mouth at bedtime as needed for muscle spasms. 30 tablet 1   montelukast (SINGULAIR) 10 MG tablet Take 1 tablet (10 mg total) by mouth at bedtime. 90 tablet 1   pantoprazole (PROTONIX) 40 MG tablet TAKE (  1) TABLET TWICE A DAY BEFORE MEALS. (Patient taking differently: as needed.) 60 tablet 5   polyethylene glycol powder (GLYCOLAX/MIRALAX) 17 GM/SCOOP powder Take 17 g by mouth 2 (two) times daily as needed for moderate constipation. 500 g 3   simvastatin (ZOCOR) 40 MG tablet TAKE 1 TABLET ONCE DAILY. 90 tablet 3   triamcinolone (NASACORT) 55 MCG/ACT AERO nasal inhaler Place 2 sprays into the nose as needed.     triamcinolone cream (KENALOG) 0.1 % Apply 1 application. topically 2 (two) times daily. APPLY TOPICALLY TWICE A DAY 45 g 1   VESICARE 10 MG tablet Take 1 tablet (10 mg total) by mouth daily as needed (bladder). 90 tablet 3   traMADol (ULTRAM) 50 MG tablet Take 0.5-1 tablets (25-50 mg total) by mouth every 6 (six) hours as needed for severe pain. (Patient not taking:  Reported on 07/21/2022) 120 tablet 3   No facility-administered medications prior to visit.    ROS: Review of Systems  Constitutional:  Negative for activity change, appetite change, chills, fatigue and unexpected weight change.  HENT:  Negative for congestion, mouth sores and sinus pressure.   Eyes:  Negative for visual disturbance.  Respiratory:  Negative for cough and chest tightness.   Gastrointestinal:  Negative for abdominal pain and nausea.  Genitourinary:  Negative for difficulty urinating, frequency and vaginal pain.  Musculoskeletal:  Positive for back pain and neck stiffness. Negative for gait problem.  Skin:  Negative for pallor and rash.  Neurological:  Negative for dizziness, tremors, weakness, numbness and headaches.  Psychiatric/Behavioral:  Negative for confusion and sleep disturbance.     Objective:  BP 130/76 (BP Location: Left Arm)   Pulse 77   Temp 98 F (36.7 C) (Oral)   Ht 5' (1.524 m)   Wt 111 lb 12.8 oz (50.7 kg)   SpO2 97%   BMI 21.83 kg/m   BP Readings from Last 3 Encounters:  07/21/22 130/76  07/12/22 131/70  07/08/22 (!) 141/71    Wt Readings from Last 3 Encounters:  07/21/22 111 lb 12.8 oz (50.7 kg)  07/08/22 110 lb (49.9 kg)  07/05/22 111 lb 3.2 oz (50.4 kg)    Physical Exam Constitutional:      General: She is not in acute distress.    Appearance: Normal appearance. She is well-developed.  HENT:     Head: Normocephalic.     Right Ear: External ear normal.     Left Ear: External ear normal.     Nose: Nose normal.  Eyes:     General:        Right eye: No discharge.        Left eye: No discharge.     Conjunctiva/sclera: Conjunctivae normal.     Pupils: Pupils are equal, round, and reactive to light.  Neck:     Thyroid: No thyromegaly.     Vascular: No JVD.     Trachea: No tracheal deviation.  Cardiovascular:     Rate and Rhythm: Normal rate and regular rhythm.     Heart sounds: Normal heart sounds.  Pulmonary:     Effort: No  respiratory distress.     Breath sounds: No stridor. No wheezing.  Abdominal:     General: Bowel sounds are normal. There is no distension.     Palpations: Abdomen is soft. There is no mass.     Tenderness: There is no abdominal tenderness. There is no guarding or rebound.  Musculoskeletal:  General: Tenderness present.     Cervical back: Normal range of motion and neck supple. No rigidity.  Lymphadenopathy:     Cervical: No cervical adenopathy.  Skin:    Findings: No erythema, lesion or rash.  Neurological:     Cranial Nerves: No cranial nerve deficit.     Motor: No abnormal muscle tone.     Coordination: Coordination normal.     Gait: Gait abnormal.     Deep Tendon Reflexes: Reflexes normal.  Psychiatric:        Behavior: Behavior normal.        Thought Content: Thought content normal.        Judgment: Judgment normal.   A healed wound on the left forehead covered with Dermabond glue and Steri-Strips.  Removed Steri-Strips partially.  Annslee was instructed to use antibiotic ointment to help to detach Steri-Strips/glue.  Lab Results  Component Value Date   WBC 6.4 06/28/2022   HGB 12.3 06/28/2022   HCT 36.8 06/28/2022   PLT 230.0 06/28/2022   GLUCOSE 93 06/28/2022   CHOL 186 02/17/2022   TRIG 141.0 02/17/2022   HDL 59.30 02/17/2022   LDLDIRECT 98.0 08/13/2021   LDLCALC 99 02/17/2022   ALT 24 06/28/2022   AST 19 06/28/2022   NA 136 06/28/2022   K 4.2 06/28/2022   CL 101 06/28/2022   CREATININE 0.73 06/28/2022   BUN 14 06/28/2022   CO2 27 06/28/2022   TSH 2.35 02/17/2022   HGBA1C 5.5 06/13/2020    MR LUMBAR SPINE WO CONTRAST  Result Date: 07/15/2022 CLINICAL DATA:  Low back pain and burning radiation to the right hip and lower extremity for 3-4 months EXAM: MRI LUMBAR SPINE WITHOUT CONTRAST TECHNIQUE: Multiplanar, multisequence MR imaging of the lumbar spine was performed. No intravenous contrast was administered. COMPARISON:  Lumbar spine radiographs  11/12/2021, CT chest/abdomen/pelvis 01/01/2015, lumbar spine MRI 10/03/2009 FINDINGS: Segmentation: Standard; the lowest formed disc space is designated L5-S1. Alignment: There is grade 1 anterolisthesis of T11 on T12,. These findings are overall similar to 2010, though exaggerated lumbar lordosis is slightly increased. Grade 1 retrolisthesis of L1 on L2, L2 on L3, and L3 on L4, and grade 1 anterolisthesis of L4 on L5 and L5 on S1 Vertebrae: Vertebral body heights are preserved. Background marrow signal is normal. There is no suspicious marrow signal abnormality or marrow edema. Conus medullaris and cauda equina: Conus extends to the L1-L2 level. Conus and cauda equina appear normal. Paraspinal and other soft tissues: Unremarkable. Disc levels: There is advanced multilevel disc desiccation and narrowing throughout the lumbar spine which is overall progressed since 2010, most advanced at L5-S1. T11-T12: Only imaged in the sagittal plane. There is a disc bulge and ligamentum flavum thickening resulting in moderate spinal canal stenosis with indentation of the dorsal cord surface and mild right neural foraminal stenosis. The spinal canal stenosis is new since 2010. T12-L1: There is a disc bulge and moderate left worse than right facet arthropathy with ligamentum flavum thickening resulting in mild spinal canal stenosis without significant neural foraminal stenosis. The spinal canal stenosis is increased since 2010. L1-L2: There is grade 1 retrolisthesis with a mild disc bulge, degenerative endplate change, and mild facet arthropathy resulting in moderate right worse than left neural foraminal stenosis without significant spinal canal stenosis, slightly progressed since 2010. L2-L3: There is a mild disc bulge, degenerative endplate change, and facet arthropathy resulting in moderate right worse than left neural foraminal stenosis without significant spinal canal stenosis, slightly progressed since  2010. L3-L4: There is  a mild disc bulge, degenerative endplate change, and mild bilateral facet arthropathy resulting in mild narrowing of the left subarticular zone without evidence of frank nerve root impingement and moderate right worse than left neural foraminal stenosis, stable to slightly progressed since 2010. L4-L5: There is grade 1 anterolisthesis with uncovering of the disc posteriorly and associated moderate facet arthropathy resulting in moderate spinal canal stenosis just below the level of the disc space with bilateral subarticular zone narrowing, left worse than right, and severe left and mild right neural foraminal stenosis. These findings are similar to 2010 L5-S1: There is grade 1 anterolisthesis with uncovering of the disc posteriorly and associated advanced bilateral facet arthropathy resulting in severe bilateral neural foraminal stenosis without significant spinal canal stenosis. The neural foraminal stenosis has increased since 2010 IMPRESSION: 1. Multilevel grade 1 spondylolisthesis as detailed above is overall not significantly changed since 2010; however, associated multilevel disc degeneration has progressed. 2. Advanced facet arthropathy at L4-L5 and L5-S1 results in severe neural foraminal stenosis on the left at L4-L5 and bilaterally at L5-S1, and moderate spinal canal stenosis just below the level of the L4-L5 disc space. The foraminal stenosis at L5-S1 has worsened since 2010. 3. Moderate spinal canal stenosis at T11-T12 is new since 2000. 4. Moderate right worse than left neural foraminal stenosis at L1-L2 through L3-L4. Electronically Signed   By: Valetta Mole M.D.   On: 07/15/2022 10:00    Assessment & Plan:   Problem List Items Addressed This Visit     Insomnia    Gabapentin should help Harnoor to sleep better.  Stress reduction discussed.  She will continue with lorazepam.      Low back pain    Sira had an MRI w/ Dr Red Christians dx w/ pinch nerve - on Gabapentin. She may need to reduce  gabapentin to nighttime only due to lightheadedness      Open forehead wound    A healed wound on the left forehead covered with Dermabond glue and Steri-Strips.  Removed Steri-Strips partially.  Morganna was instructed to use antibiotic ointment to help to detach Steri-Strips/glue.      Stress at home    Keerthana is overwhelmed with caring for Irv.  She is getting more help.         No orders of the defined types were placed in this encounter.     Follow-up: Return in about 3 months (around 10/21/2022) for a follow-up visit.  Walker Kehr, MD

## 2022-07-21 NOTE — Assessment & Plan Note (Signed)
Gabapentin should help [name] to sleep better.  Stress reduction discussed.  She will continue with lorazepam.

## 2022-07-21 NOTE — Assessment & Plan Note (Signed)
[name] is overwhelmed with caring for [name].  She is getting more help.

## 2022-07-21 NOTE — Assessment & Plan Note (Addendum)
A healed wound on the left forehead covered with Dermabond glue and Steri-Strips.  Removed Steri-Strips partially.  [name] was instructed to use antibiotic ointment to help to detach Steri-Strips/glue.

## 2022-07-26 ENCOUNTER — Other Ambulatory Visit: Payer: Self-pay | Admitting: Neurological Surgery

## 2022-07-26 DIAGNOSIS — M5416 Radiculopathy, lumbar region: Secondary | ICD-10-CM

## 2022-07-27 ENCOUNTER — Encounter: Payer: Self-pay | Admitting: Internal Medicine

## 2022-07-28 ENCOUNTER — Ambulatory Visit
Admission: RE | Admit: 2022-07-28 | Discharge: 2022-07-28 | Disposition: A | Discharge status: Home / Self Care | Payer: Medicare Other | Source: Ambulatory Visit | Attending: Neurological Surgery | Admitting: Neurological Surgery

## 2022-07-28 DIAGNOSIS — M5416 Radiculopathy, lumbar region: Secondary | ICD-10-CM

## 2022-07-28 DIAGNOSIS — M47817 Spondylosis without myelopathy or radiculopathy, lumbosacral region: Secondary | ICD-10-CM | POA: Diagnosis not present

## 2022-07-28 MED ORDER — IOPAMIDOL (ISOVUE-M 200) INJECTION 41%
1.0000 mL | Freq: Once | INTRAMUSCULAR | Status: AC
  Administered 2022-07-28: 1 mL via EPIDURAL

## 2022-07-28 MED ORDER — METHYLPREDNISOLONE ACETATE 40 MG/ML INJ SUSP (RADIOLOG
80.0000 mg | Freq: Once | INTRAMUSCULAR | Status: AC
  Administered 2022-07-28: 80 mg via EPIDURAL

## 2022-07-28 NOTE — Discharge Instructions (Signed)

## 2022-08-04 DIAGNOSIS — H04123 Dry eye syndrome of bilateral lacrimal glands: Secondary | ICD-10-CM | POA: Diagnosis not present

## 2022-08-04 DIAGNOSIS — M5416 Radiculopathy, lumbar region: Secondary | ICD-10-CM | POA: Diagnosis not present

## 2022-08-06 ENCOUNTER — Other Ambulatory Visit: Payer: Self-pay | Admitting: Neurological Surgery

## 2022-08-06 DIAGNOSIS — M5416 Radiculopathy, lumbar region: Secondary | ICD-10-CM

## 2022-08-10 DIAGNOSIS — L821 Other seborrheic keratosis: Secondary | ICD-10-CM | POA: Diagnosis not present

## 2022-08-10 DIAGNOSIS — Z85828 Personal history of other malignant neoplasm of skin: Secondary | ICD-10-CM | POA: Diagnosis not present

## 2022-08-10 DIAGNOSIS — L578 Other skin changes due to chronic exposure to nonionizing radiation: Secondary | ICD-10-CM | POA: Diagnosis not present

## 2022-08-10 DIAGNOSIS — D485 Neoplasm of uncertain behavior of skin: Secondary | ICD-10-CM | POA: Diagnosis not present

## 2022-08-10 DIAGNOSIS — L723 Sebaceous cyst: Secondary | ICD-10-CM | POA: Diagnosis not present

## 2022-08-10 DIAGNOSIS — L570 Actinic keratosis: Secondary | ICD-10-CM | POA: Diagnosis not present

## 2022-08-12 ENCOUNTER — Ambulatory Visit
Admission: RE | Admit: 2022-08-12 | Discharge: 2022-08-12 | Disposition: A | Discharge status: Home / Self Care | Payer: Medicare Other | Source: Ambulatory Visit | Attending: Neurological Surgery | Admitting: Neurological Surgery

## 2022-08-12 DIAGNOSIS — M47817 Spondylosis without myelopathy or radiculopathy, lumbosacral region: Secondary | ICD-10-CM | POA: Diagnosis not present

## 2022-08-12 DIAGNOSIS — M5416 Radiculopathy, lumbar region: Secondary | ICD-10-CM

## 2022-08-12 MED ORDER — IOPAMIDOL (ISOVUE-M 200) INJECTION 41%
1.0000 mL | Freq: Once | INTRAMUSCULAR | Status: AC
  Administered 2022-08-12: 1 mL via EPIDURAL

## 2022-08-12 MED ORDER — METHYLPREDNISOLONE ACETATE 40 MG/ML INJ SUSP (RADIOLOG
80.0000 mg | Freq: Once | INTRAMUSCULAR | Status: AC
  Administered 2022-08-12: 80 mg via EPIDURAL

## 2022-08-12 NOTE — Discharge Instructions (Signed)

## 2022-08-20 ENCOUNTER — Ambulatory Visit: Payer: Medicare Other | Admitting: Obstetrics and Gynecology

## 2022-08-25 DIAGNOSIS — C44311 Basal cell carcinoma of skin of nose: Secondary | ICD-10-CM | POA: Diagnosis not present

## 2022-08-25 DIAGNOSIS — D485 Neoplasm of uncertain behavior of skin: Secondary | ICD-10-CM | POA: Diagnosis not present

## 2022-08-27 ENCOUNTER — Ambulatory Visit: Payer: Medicare Other | Admitting: Obstetrics and Gynecology

## 2022-08-27 DIAGNOSIS — M5416 Radiculopathy, lumbar region: Secondary | ICD-10-CM | POA: Diagnosis not present

## 2022-09-07 ENCOUNTER — Other Ambulatory Visit: Payer: Self-pay | Admitting: Internal Medicine

## 2022-09-09 DIAGNOSIS — C44311 Basal cell carcinoma of skin of nose: Secondary | ICD-10-CM | POA: Diagnosis not present

## 2022-09-21 DIAGNOSIS — M5416 Radiculopathy, lumbar region: Secondary | ICD-10-CM | POA: Diagnosis not present

## 2022-09-22 NOTE — Progress Notes (Addendum)
86 y.o. [id number] Married White or Caucasian Not Hispanic or Latino female here for annual exam.   Her Husband passed September 04, 2022 s/p a fall. She is very sad. She has good support.    H/o vaginal atrophy, uses vaginal estrogen off and on (if she is uncomfortable).  No bowel c/o, no leakage of stool.  She c/o nocturia every 2-3 hours. Voiding normal amounts. Drinks one glass of wine at night and a glass of water at bedtime.   She has a pinched nerve in her lower back and has had steroid injections.   No LMP recorded. Patient is postmenopausal.          Sexually active: No.  The current method of family planning is post menopausal status.    Exercising: No.   Just started PT  Smoker:  no  Health Maintenance: Pap:  08/07/21 WNL 07/03/20 WNL History of abnormal Pap:  no MMG:  01/07/22 density B Bi-rads 1 neg  BMD:   10/30/20 osteoporosis T-score -2.7. Increase in BMD in right hip; 07/04/18 Low T score of -2.8 in her forearm.  On Prolia for 6 years Colonoscopy: 06/28/12 normal  TDaP:  06/18/20  Gardasil: n/a   reports that she quit smoking about 50 years ago. Her smoking use included cigarettes. She has never been exposed to tobacco smoke. She has never used smokeless tobacco. She reports current alcohol use. She reports that she does not use drugs. She live at [facility]. 2 sons, 2 grown grandsons. They all live in [place]. She is from [place], moved her 2 years.   Past Medical History:  Diagnosis Date   Allergic rhinitis    Asthma    Atrophic vaginitis    Baker's cyst    Left-Dr. Aluisio   Cataract    Dr. Katy Fitch   CHF (congestive heart failure) (HCC)    Colon polyps    Fibroid    Gastritis    chronic   GERD (gastroesophageal reflux disease)    Heart murmur    Hemorrhoids    Hyperlipidemia    Hypothyroidism    IBS (irritable bowel syndrome)    MVP (mitral valve prolapse)    Antibiotics required for dental procedures   OA (osteoarthritis)    Osteoporosis 06/2018   T score -2.2  stable on Prolia   Overactive bladder    Thyroid disease    Thyroid nodule    Torn meniscus    bilateral    Past Surgical History:  Procedure Laterality Date   CATARACT EXTRACTION, BILATERAL     EYE SURGERY     POSTERIOR CERVICAL FUSION/FORAMINOTOMY N/A 05/15/2021   Procedure: Cervical One Laminectomy with resection of cyst, Fixation from Occiput to Cervical Four;  Surgeon: Erline Levine, MD;  Location: [facility];  Service: Neurosurgery;  Laterality: N/A;   TONSILLECTOMY      Current Outpatient Medications  Medication Sig Dispense Refill   acetaminophen (TYLENOL) 500 MG tablet Take 1,000 mg by mouth every 6 (six) hours as needed for mild pain.     albuterol (VENTOLIN HFA) 108 (90 Base) MCG/ACT inhaler Take 2 puffs every 4-6 hours as needed     Azelastine HCl 0.15 % SOLN Place into both nostrils. Place into both nostrils.     Calcium Carbonate-Vitamin D (CALCIUM-VITAMIN D) 500-200 MG-UNIT per tablet Take 3 tablets by mouth daily.     desonide (DESOWEN) 0.05 % cream Apply 1 application topically as needed.     EPINEPHrine (EPIPEN 2-PAK) 0.3 mg/0.3 mL  IJ SOAJ injection Inject 0.3 mLs (0.3 mg total) into the muscle as needed. 1 Device 1   estradiol (ESTRACE) 0.1 MG/GM vaginal cream Place 1 gram vaginally 2 x a week at hs 42.5 g 1   famotidine (PEPCID) 40 MG tablet Take 1 tablet (40 mg total) by mouth at bedtime. NEEDS OFFICE VISIT FOR ADDITIONAL REFILLS 30 tablet 2   ipratropium (ATROVENT) 0.06 % nasal spray Place 1 spray into the nose 3 (three) times daily as needed for rhinitis. 15 mL 0   levocetirizine (XYZAL) 5 MG tablet Take 5 mg by mouth every evening.     levothyroxine (SYNTHROID) 50 MCG tablet TAKE ONE TABLET BY MOUTH DAILY BEFORE BREAKFAST. 30 tablet 5   LORazepam (ATIVAN) 1 MG tablet Take 2-3 tablets at bedtime and 1-2 tablets in the daytime as needed. 150 tablet 2   methocarbamol (ROBAXIN) 500 MG tablet Take 1 tablet (500 mg total) by mouth at bedtime as needed for muscle spasms. 30  tablet 1   montelukast (SINGULAIR) 10 MG tablet Take 1 tablet (10 mg total) by mouth at bedtime. 90 tablet 1   pantoprazole (PROTONIX) 40 MG tablet TAKE (1) TABLET TWICE A DAY BEFORE MEALS. (Patient taking differently: as needed.) 60 tablet 5   polyethylene glycol powder (GLYCOLAX/MIRALAX) 17 GM/SCOOP powder Take 17 g by mouth 2 (two) times daily as needed for moderate constipation. 500 g 3   pregabalin (LYRICA) 25 MG capsule take 1 capsule by oral route 2 times every day; start 1 qhsX5 days, then BID     simvastatin (ZOCOR) 40 MG tablet TAKE ONE TABLET ONCE DAILY 90 tablet 3   traMADol (ULTRAM) 50 MG tablet Take 0.5-1 tablets (25-50 mg total) by mouth every 6 (six) hours as needed for severe pain. 120 tablet 3   triamcinolone (NASACORT) 55 MCG/ACT AERO nasal inhaler Place 2 sprays into the nose as needed.     VESICARE 10 MG tablet Take 1 tablet (10 mg total) by mouth daily as needed (bladder). 90 tablet 3   diclofenac Sodium (VOLTAREN) 1 % GEL APPLY 4 GRAMS 4 TIMES DAILY. 200 g 3   triamcinolone cream (KENALOG) 0.1 % Apply 1 application. topically 2 (two) times daily. APPLY TOPICALLY TWICE A DAY (Patient not taking: Reported on 09/29/2022) 45 g 1   No current facility-administered medications for this visit.    Family History  Problem Relation Age of Onset   Heart disease Mother    Hypertension Mother    Osteoporosis Mother    Congestive Heart Failure Mother    Pancreatic cancer Father    Heart attack Brother    Drug abuse Brother        over dose    Healthy Son    Healthy Son    Colon cancer Neg Hx    Colon polyps Neg Hx    Rectal cancer Neg Hx    Stomach cancer Neg Hx     Review of Systems  All other systems reviewed and are negative.   Exam:   BP 128/82   Pulse 75   Ht '4\' 11"'$  (1.499 m)   Wt 112 lb (50.8 kg)   SpO2 100%   BMI 22.62 kg/m   Weight change: '@WEIGHTCHANGE'$ @ Height:   Height: '4\' 11"'$  (149.9 cm)  Ht Readings from Last 3 Encounters:  09/29/22 '4\' 11"'$  (1.499 m)   07/21/22 5' (1.524 m)  07/08/22 5' (1.524 m)    General appearance: alert, cooperative and appears stated age Head: Normocephalic,  without obvious abnormality, atraumatic Neck: no adenopathy, supple, symmetrical, trachea midline and thyroid normal to inspection and palpation Breasts: normal appearance, no masses or tenderness Abdomen: soft, non-tender; non distended,  no masses,  no organomegaly Extremities: extremities normal, atraumatic, no cyanosis or edema Skin: Skin color, texture, turgor normal. No rashes or lesions Lymph nodes: Cervical, supraclavicular, and axillary nodes normal. No abnormal inguinal nodes palpated Neurologic: Grossly normal   Pelvic: External genitalia:  no lesions              Urethra:  normal appearing urethra with no masses, tenderness or lesions              Bartholins and Skenes: normal                 Vagina: normal appearing vagina with normal color and discharge, no lesions              Cervix: no lesions               Bimanual Exam:  Uterus:  normal size, contour, position, consistency, mobility, non-tender              Adnexa: no mass, fullness, tenderness               Rectovaginal: Confirms               Anus:  normal sphincter tone, no lesions  Gae Dry, CMA chaperoned for the exam.  1. Encounter for breast and pelvic examination No pap needed Labs with primary No further colon cancer screening Mammogram is due in 2/24  2. Nocturia Voiding normal amounts, but it has worsened. Discussed avoiding fluids prior to bed, discussed ETOH acts as a diuretic - Urinalysis, Complete  3. History of osteoporosis Discussed calcium and vit d, balance, avoiding falls and exercise - DG Bone Density; Future  4. Hypoestrogenism - DG Bone Density; Future  5. Vaginal atrophy Using estogen prn discomfort - estradiol (ESTRACE) 0.1 MG/GM vaginal cream; Place 1 gram vaginally 2 x a week at hs  Dispense: 42.5 g; Refill: 1   Addendum: urine culture  will be sent

## 2022-09-23 DIAGNOSIS — Z23 Encounter for immunization: Secondary | ICD-10-CM | POA: Diagnosis not present

## 2022-09-23 DIAGNOSIS — Z5189 Encounter for other specified aftercare: Secondary | ICD-10-CM | POA: Diagnosis not present

## 2022-09-23 DIAGNOSIS — Z85828 Personal history of other malignant neoplasm of skin: Secondary | ICD-10-CM | POA: Diagnosis not present

## 2022-09-23 DIAGNOSIS — L720 Epidermal cyst: Secondary | ICD-10-CM | POA: Diagnosis not present

## 2022-09-28 DIAGNOSIS — M62562 Muscle wasting and atrophy, not elsewhere classified, left lower leg: Secondary | ICD-10-CM | POA: Diagnosis not present

## 2022-09-28 DIAGNOSIS — M5416 Radiculopathy, lumbar region: Secondary | ICD-10-CM | POA: Diagnosis not present

## 2022-09-28 DIAGNOSIS — M62561 Muscle wasting and atrophy, not elsewhere classified, right lower leg: Secondary | ICD-10-CM | POA: Diagnosis not present

## 2022-09-28 DIAGNOSIS — R2689 Other abnormalities of gait and mobility: Secondary | ICD-10-CM | POA: Diagnosis not present

## 2022-09-29 ENCOUNTER — Ambulatory Visit (INDEPENDENT_AMBULATORY_CARE_PROVIDER_SITE_OTHER): Payer: Medicare Other | Admitting: Obstetrics and Gynecology

## 2022-09-29 ENCOUNTER — Other Ambulatory Visit (INDEPENDENT_AMBULATORY_CARE_PROVIDER_SITE_OTHER): Payer: Medicare Other

## 2022-09-29 ENCOUNTER — Encounter: Payer: Self-pay | Admitting: Obstetrics and Gynecology

## 2022-09-29 VITALS — BP 128/82 | HR 75 | Ht 59.0 in | Wt 112.0 lb

## 2022-09-29 DIAGNOSIS — Z8739 Personal history of other diseases of the musculoskeletal system and connective tissue: Secondary | ICD-10-CM

## 2022-09-29 DIAGNOSIS — Z01419 Encounter for gynecological examination (general) (routine) without abnormal findings: Secondary | ICD-10-CM | POA: Diagnosis not present

## 2022-09-29 DIAGNOSIS — Z85828 Personal history of other malignant neoplasm of skin: Secondary | ICD-10-CM

## 2022-09-29 DIAGNOSIS — M545 Low back pain, unspecified: Secondary | ICD-10-CM

## 2022-09-29 DIAGNOSIS — G8929 Other chronic pain: Secondary | ICD-10-CM

## 2022-09-29 DIAGNOSIS — N952 Postmenopausal atrophic vaginitis: Secondary | ICD-10-CM

## 2022-09-29 DIAGNOSIS — R351 Nocturia: Secondary | ICD-10-CM

## 2022-09-29 DIAGNOSIS — E2839 Other primary ovarian failure: Secondary | ICD-10-CM

## 2022-09-29 DIAGNOSIS — Z23 Encounter for immunization: Secondary | ICD-10-CM | POA: Diagnosis not present

## 2022-09-29 HISTORY — DX: Personal history of other malignant neoplasm of skin: Z85.828

## 2022-09-29 LAB — CBC WITH DIFFERENTIAL/PLATELET
Basophils Absolute: 0.1 10*3/uL (ref 0.0–0.1)
Basophils Relative: 0.7 % (ref 0.0–3.0)
Eosinophils Absolute: 0.1 10*3/uL (ref 0.0–0.7)
Eosinophils Relative: 1 % (ref 0.0–5.0)
HCT: 36.8 % (ref 36.0–46.0)
Hemoglobin: 12.5 g/dL (ref 12.0–15.0)
Lymphocytes Relative: 18.5 % (ref 12.0–46.0)
Lymphs Abs: 1.4 10*3/uL (ref 0.7–4.0)
MCHC: 34.1 g/dL (ref 30.0–36.0)
MCV: 96.1 fl (ref 78.0–100.0)
Monocytes Absolute: 0.8 10*3/uL (ref 0.1–1.0)
Monocytes Relative: 9.9 % (ref 3.0–12.0)
Neutro Abs: 5.5 10*3/uL (ref 1.4–7.7)
Neutrophils Relative %: 69.9 % (ref 43.0–77.0)
Platelets: 352 10*3/uL (ref 150.0–400.0)
RBC: 3.82 Mil/uL — ABNORMAL LOW (ref 3.87–5.11)
RDW: 14.1 % (ref 11.5–15.5)
WBC: 7.8 10*3/uL (ref 4.0–10.5)

## 2022-09-29 LAB — URINALYSIS, COMPLETE
Bilirubin Urine: NEGATIVE
Casts: NONE SEEN /LPF
Crystals: NONE SEEN /HPF
Glucose, UA: NEGATIVE
Hgb urine dipstick: NEGATIVE
Hyaline Cast: NONE SEEN /LPF
Ketones, ur: NEGATIVE
Nitrite: NEGATIVE
Protein, ur: NEGATIVE
Specific Gravity, Urine: 1.015 (ref 1.001–1.035)
Yeast: NONE SEEN /HPF
pH: 5.5 (ref 5.0–8.0)

## 2022-09-29 LAB — COMPREHENSIVE METABOLIC PANEL
ALT: 29 U/L (ref 0–35)
AST: 21 U/L (ref 0–37)
Albumin: 4.3 g/dL (ref 3.5–5.2)
Alkaline Phosphatase: 35 U/L — ABNORMAL LOW (ref 39–117)
BUN: 17 mg/dL (ref 6–23)
CO2: 29 mEq/L (ref 19–32)
Calcium: 9.2 mg/dL (ref 8.4–10.5)
Chloride: 99 mEq/L (ref 96–112)
Creatinine, Ser: 0.69 mg/dL (ref 0.40–1.20)
GFR: 79.8 mL/min (ref >60.00)
Glucose, Bld: 102 mg/dL — ABNORMAL HIGH (ref 70–99)
Potassium: 4.1 mEq/L (ref 3.5–5.1)
Sodium: 133 mEq/L — ABNORMAL LOW (ref 135–145)
Total Bilirubin: 0.4 mg/dL (ref 0.2–1.2)
Total Protein: 7.3 g/dL (ref 6.0–8.3)

## 2022-09-29 MED ORDER — ESTRADIOL 0.1 MG/GM VA CREA: TOPICAL_CREAM | VAGINAL | 1 refills | Status: DC

## 2022-09-29 NOTE — Patient Instructions (Addendum)
Try viactiv chews for calcium and vit d.  EXERCISE   We recommended that you start or continue a regular exercise program for good health. Physical activity is anything that gets your body moving, some is better than none. The CDC recommends 150 minutes per week of Moderate-Intensity Aerobic Activity and 2 or more days of Muscle Strengthening Activity.  Benefits of exercise are limitless: helps weight loss/weight maintenance, improves mood and energy, helps with depression and anxiety, improves sleep, tones and strengthens muscles, improves balance, improves bone density, protects from chronic conditions such as heart disease, high blood pressure and diabetes and so much more. To learn more visit: WhyNotPoker.uy  DIET: Good nutrition starts with a healthy diet of fruits, vegetables, whole grains, and lean protein sources. Drink plenty of water for hydration. Minimize empty calories, sodium, sweets. For more information about dietary recommendations visit: GeekRegister.com.ee and http://schaefer-mitchell.com/  ALCOHOL:  Women should limit their alcohol intake to no more than 7 drinks/beers/glasses of wine (combined, not each!) per week. Moderation of alcohol intake to this level decreases your risk of breast cancer and liver damage.  If you are concerned that you may have a problem, or your friends have told you they are concerned about your drinking, there are many resources to help. A well-known program that is free, effective, and available to all people all over the nation is Alcoholics Anonymous.  Check out this site to learn more: BlockTaxes.se   CALCIUM AND VITAMIN D:  Adequate intake of calcium and Vitamin D are recommended for bone health.  You should be getting between 1000-1200 mg of calcium and 800 units of Vitamin D daily between diet and supplements  MAMMOGRAMS:  All women over 34 years old should have  a routine mammogram.   COLON CANCER SCREENING: Now recommend starting at age 9. At this time colonoscopy is not covered for routine screening until 50. There are take home tests that can be done between 45-49.   COLONOSCOPY:  Colonoscopy to screen for colon cancer is recommended for all women at age 73.  We know, you hate the idea of the prep.  We agree, BUT, having colon cancer and not knowing it is worse!!  Colon cancer so often starts as a polyp that can be seen and removed at colonscopy, which can quite literally save your life!  And if your first colonoscopy is normal and you have no family history of colon cancer, most women don't have to have it again for 10 years.  Once every ten years, you can do something that may end up saving your life, right?  We will be happy to help you get it scheduled when you are ready.  Be sure to check your insurance coverage so you understand how much it will cost.  It may be covered as a preventative service at no cost, but you should check your particular policy.      Breast Self-Awareness Breast self-awareness means being familiar with how your breasts look and feel. It involves checking your breasts regularly and reporting any changes to your health care provider. Practicing breast self-awareness is important. A change in your breasts can be a sign of a serious medical problem. Being familiar with how your breasts look and feel allows you to find any problems early, when treatment is more likely to be successful. All women should practice breast self-awareness, including women who have had breast implants. How to do a breast self-exam One way to learn what is normal for your  breasts and whether your breasts are changing is to do a breast self-exam. To do a breast self-exam: Look for Changes  Remove all the clothing above your waist. Stand in front of a mirror in a room with good lighting. Put your hands on your hips. Push your hands firmly  downward. Compare your breasts in the mirror. Look for differences between them (asymmetry), such as: Differences in shape. Differences in size. Puckers, dips, and bumps in one breast and not the other. Look at each breast for changes in your skin, such as: Redness. Scaly areas. Look for changes in your nipples, such as: Discharge. Bleeding. Dimpling. Redness. A change in position. Feel for Changes Carefully feel your breasts for lumps and changes. It is best to do this while lying on your back on the floor and again while sitting or standing in the shower or tub with soapy water on your skin. Feel each breast in the following way: Place the arm on the side of the breast you are examining above your head. Feel your breast with the other hand. Start in the nipple area and make  inch (2 cm) overlapping circles to feel your breast. Use the pads of your three middle fingers to do this. Apply light pressure, then medium pressure, then firm pressure. The light pressure will allow you to feel the tissue closest to the skin. The medium pressure will allow you to feel the tissue that is a little deeper. The firm pressure will allow you to feel the tissue close to the ribs. Continue the overlapping circles, moving downward over the breast until you feel your ribs below your breast. Move one finger-width toward the center of the body. Continue to use the  inch (2 cm) overlapping circles to feel your breast as you move slowly up toward your collarbone. Continue the up and down exam using all three pressures until you reach your armpit.  Write Down What You Find  Write down what is normal for each breast and any changes that you find. Keep a written record with breast changes or normal findings for each breast. By writing this information down, you do not need to depend only on memory for size, tenderness, or location. Write down where you are in your menstrual cycle, if you are still menstruating. If  you are having trouble noticing differences in your breasts, do not get discouraged. With time you will become more familiar with the variations in your breasts and more comfortable with the exam. How often should I examine my breasts? Examine your breasts every month. If you are breastfeeding, the best time to examine your breasts is after a feeding or after using a breast pump. If you menstruate, the best time to examine your breasts is 5-7 days after your period is over. During your period, your breasts are lumpier, and it may be more difficult to notice changes. When should I see my health care provider? See your health care provider if you notice: A change in shape or size of your breasts or nipples. A change in the skin of your breast or nipples, such as a reddened or scaly area. Unusual discharge from your nipples. A lump or thick area that was not there before. Pain in your breasts. Anything that concerns you.

## 2022-09-29 NOTE — Addendum Note (Signed)
Addended by: Dorothy Spark on: 09/29/2022 04:15 PM   Modules accepted: Orders

## 2022-09-30 LAB — URINE CULTURE
MICRO NUMBER:: 14130342
Result:: NO GROWTH
SPECIMEN QUALITY:: ADEQUATE

## 2022-10-04 DIAGNOSIS — M62562 Muscle wasting and atrophy, not elsewhere classified, left lower leg: Secondary | ICD-10-CM | POA: Diagnosis not present

## 2022-10-04 DIAGNOSIS — M5416 Radiculopathy, lumbar region: Secondary | ICD-10-CM | POA: Diagnosis not present

## 2022-10-04 DIAGNOSIS — R2689 Other abnormalities of gait and mobility: Secondary | ICD-10-CM | POA: Diagnosis not present

## 2022-10-04 DIAGNOSIS — M62561 Muscle wasting and atrophy, not elsewhere classified, right lower leg: Secondary | ICD-10-CM | POA: Diagnosis not present

## 2022-10-06 ENCOUNTER — Encounter: Payer: Self-pay | Admitting: Internal Medicine

## 2022-10-06 ENCOUNTER — Ambulatory Visit (INDEPENDENT_AMBULATORY_CARE_PROVIDER_SITE_OTHER): Payer: Medicare Other | Admitting: Internal Medicine

## 2022-10-06 VITALS — BP 112/64 | HR 92 | Temp 97.8°F | Ht 59.0 in | Wt 112.8 lb

## 2022-10-06 DIAGNOSIS — E785 Hyperlipidemia, unspecified: Secondary | ICD-10-CM

## 2022-10-06 DIAGNOSIS — F4321 Adjustment disorder with depressed mood: Secondary | ICD-10-CM

## 2022-10-06 DIAGNOSIS — M545 Low back pain, unspecified: Secondary | ICD-10-CM

## 2022-10-06 DIAGNOSIS — R269 Unspecified abnormalities of gait and mobility: Secondary | ICD-10-CM

## 2022-10-06 DIAGNOSIS — N3281 Overactive bladder: Secondary | ICD-10-CM

## 2022-10-06 DIAGNOSIS — J301 Allergic rhinitis due to pollen: Secondary | ICD-10-CM

## 2022-10-06 DIAGNOSIS — L853 Xerosis cutis: Secondary | ICD-10-CM | POA: Diagnosis not present

## 2022-10-06 DIAGNOSIS — G8929 Other chronic pain: Secondary | ICD-10-CM

## 2022-10-06 DIAGNOSIS — M8930 Hypertrophy of bone, unspecified site: Secondary | ICD-10-CM | POA: Diagnosis not present

## 2022-10-06 DIAGNOSIS — E034 Atrophy of thyroid (acquired): Secondary | ICD-10-CM

## 2022-10-06 MED ORDER — LORAZEPAM 1 MG PO TABS
ORAL_TABLET | ORAL | 2 refills | Status: DC
Start: 2022-10-06 — End: 2023-04-01

## 2022-10-06 NOTE — Assessment & Plan Note (Signed)
Use Solifenacin prn

## 2022-10-06 NOTE — Assessment & Plan Note (Addendum)
Using Theraworx foam for burning S/p 2 epid inj Dr Red Christians prescribed Lyrica and Tylenol, stopped Tramadol

## 2022-10-06 NOTE — Progress Notes (Signed)
Subjective:  Patient ID: [name], female    DOB: [birth year]  Age: 86 y.o. MRN: [id number]  CC: Follow-up (3 MONTH F/U)   HPI [name] presents for anxiety, grief, OA and hypothyroidism    Outpatient Medications Prior to Visit  Medication Sig Dispense Refill   acetaminophen (TYLENOL) 500 MG tablet Take 1,000 mg by mouth every 6 (six) hours as needed for mild pain.     albuterol (VENTOLIN HFA) 108 (90 Base) MCG/ACT inhaler Take 2 puffs every 4-6 hours as needed     Azelastine HCl 0.15 % SOLN Place into both nostrils. Place into both nostrils.     Calcium Carbonate-Vitamin D (CALCIUM-VITAMIN D) 500-200 MG-UNIT per tablet Take 3 tablets by mouth daily.     desonide (DESOWEN) 0.05 % cream Apply 1 application topically as needed.     diclofenac Sodium (VOLTAREN) 1 % GEL APPLY 4 GRAMS 4 TIMES DAILY. 200 g 3   EPINEPHrine (EPIPEN 2-PAK) 0.3 mg/0.3 mL IJ SOAJ injection Inject 0.3 mLs (0.3 mg total) into the muscle as needed. 1 Device 1   estradiol (ESTRACE) 0.1 MG/GM vaginal cream Place 1 gram vaginally 2 x a week at hs 42.5 g 1   famotidine (PEPCID) 40 MG tablet Take 1 tablet (40 mg total) by mouth at bedtime. NEEDS OFFICE VISIT FOR ADDITIONAL REFILLS 30 tablet 2   ipratropium (ATROVENT) 0.06 % nasal spray Place 1 spray into the nose 3 (three) times daily as needed for rhinitis. 15 mL 0   levocetirizine (XYZAL) 5 MG tablet Take 5 mg by mouth every evening.     methocarbamol (ROBAXIN) 500 MG tablet Take 1 tablet (500 mg total) by mouth at bedtime as needed for muscle spasms. 30 tablet 1   montelukast (SINGULAIR) 10 MG tablet Take 1 tablet (10 mg total) by mouth at bedtime. 90 tablet 1   pantoprazole (PROTONIX) 40 MG tablet TAKE (1) TABLET TWICE A DAY BEFORE MEALS. (Patient taking differently: as needed.) 60 tablet 5   polyethylene glycol powder (GLYCOLAX/MIRALAX) 17 GM/SCOOP powder Take 17 g by mouth 2 (two) times daily as needed for moderate constipation. 500 g 3   pregabalin (LYRICA)  25 MG capsule take 1 capsule by oral route 2 times every day; start 1 qhsX5 days, then BID     simvastatin (ZOCOR) 40 MG tablet TAKE ONE TABLET ONCE DAILY 90 tablet 3   triamcinolone (NASACORT) 55 MCG/ACT AERO nasal inhaler Place 2 sprays into the nose as needed.     triamcinolone cream (KENALOG) 0.1 % Apply 1 application. topically 2 (two) times daily. APPLY TOPICALLY TWICE A DAY 45 g 1   VESICARE 10 MG tablet Take 1 tablet (10 mg total) by mouth daily as needed (bladder). 90 tablet 3   levothyroxine (SYNTHROID) 50 MCG tablet TAKE ONE TABLET BY MOUTH DAILY BEFORE BREAKFAST. 30 tablet 5   LORazepam (ATIVAN) 1 MG tablet Take 2-3 tablets at bedtime and 1-2 tablets in the daytime as needed. 150 tablet 2   traMADol (ULTRAM) 50 MG tablet Take 0.5-1 tablets (25-50 mg total) by mouth every 6 (six) hours as needed for severe pain. 120 tablet 3   No facility-administered medications prior to visit.    ROS: Review of Systems  Constitutional:  Negative for activity change, appetite change, chills, fatigue and unexpected weight change.  HENT:  Negative for congestion, mouth sores and sinus pressure.   Eyes:  Negative for visual disturbance.  Respiratory:  Negative for cough and chest tightness.  Gastrointestinal:  Negative for abdominal pain and nausea.  Genitourinary:  Negative for difficulty urinating, frequency and vaginal pain.  Musculoskeletal:  Positive for neck stiffness. Negative for back pain and gait problem.  Skin:  Negative for pallor and rash.  Neurological:  Negative for dizziness, tremors, weakness, numbness and headaches.  Psychiatric/Behavioral:  Positive for dysphoric mood. Negative for confusion and sleep disturbance. The patient is nervous/anxious.     Objective:  BP 112/64 (BP Location: Left Arm)   Pulse 92   Temp 97.8 F (36.6 C) (Oral)   Ht '4\' 11"'$  (1.499 m)   Wt 112 lb 12.8 oz (51.2 kg)   SpO2 94%   BMI 22.78 kg/m   BP Readings from Last 3 Encounters:  10/06/22  112/64  09/29/22 128/82  08/12/22 (!) 159/67    Wt Readings from Last 3 Encounters:  10/06/22 112 lb 12.8 oz (51.2 kg)  09/29/22 112 lb (50.8 kg)  07/21/22 111 lb 12.8 oz (50.7 kg)    Physical Exam Constitutional:      General: She is not in acute distress.    Appearance: Normal appearance. She is well-developed.  HENT:     Head: Normocephalic.     Right Ear: External ear normal.     Left Ear: External ear normal.     Nose: Nose normal.  Eyes:     General:        Right eye: No discharge.        Left eye: No discharge.     Conjunctiva/sclera: Conjunctivae normal.     Pupils: Pupils are equal, round, and reactive to light.  Neck:     Thyroid: No thyromegaly.     Vascular: No JVD.     Trachea: No tracheal deviation.  Cardiovascular:     Rate and Rhythm: Normal rate and regular rhythm.     Heart sounds: Normal heart sounds.  Pulmonary:     Effort: No respiratory distress.     Breath sounds: No stridor. No wheezing.  Abdominal:     General: Bowel sounds are normal. There is no distension.     Palpations: Abdomen is soft. There is no mass.     Tenderness: There is no abdominal tenderness. There is no guarding or rebound.  Musculoskeletal:        General: No tenderness.     Cervical back: Normal range of motion and neck supple. No rigidity.  Lymphadenopathy:     Cervical: No cervical adenopathy.  Skin:    Findings: No erythema or rash.  Neurological:     Mental Status: She is oriented to person, place, and time.     Cranial Nerves: No cranial nerve deficit.     Motor: No abnormal muscle tone.     Coordination: Coordination normal.     Deep Tendon Reflexes: Reflexes normal.  Psychiatric:        Behavior: Behavior normal.        Thought Content: Thought content normal.        Judgment: Judgment normal.   Cerv spine - stiff LS w/pain  Lab Results  Component Value Date   WBC 7.8 09/29/2022   HGB 12.5 09/29/2022   HCT 36.8 09/29/2022   PLT 352.0 09/29/2022    GLUCOSE 102 (H) 09/29/2022   CHOL 186 02/17/2022   TRIG 141.0 02/17/2022   HDL 59.30 02/17/2022   LDLDIRECT 98.0 08/13/2021   LDLCALC 99 02/17/2022   ALT 29 09/29/2022   AST 21 09/29/2022   NA 133 (  L) 09/29/2022   K 4.1 09/29/2022   CL 99 09/29/2022   CREATININE 0.69 09/29/2022   BUN 17 09/29/2022   CO2 29 09/29/2022   TSH 2.35 02/17/2022   HGBA1C 5.5 06/13/2020    DG INJECT DIAG/THERA/INC NEEDLE/CATH/PLC EPI/LUMB/SAC W/IMG  Result Date: 08/12/2022 CLINICAL DATA:  Lumbosacral spondylosis without myelopathy. Low back and right leg pain. Minimal improvement after L5-S1 epidural injection 2 weeks ago. Repeat injection requested. FLUOROSCOPY: Radiation Exposure Index (as provided by the fluoroscopic device): 3.4 mGy Kerma PROCEDURE: The procedure, risks, benefits, and alternatives were explained to the patient. Questions regarding the procedure were encouraged and answered. The patient understands and consents to the procedure. LUMBAR EPIDURAL INJECTION: An interlaminar approach was performed on the right at L5-S1. The overlying skin was cleansed and anesthetized. A 3.5 inch 20 gauge epidural needle was advanced using loss-of-resistance technique. DIAGNOSTIC EPIDURAL INJECTION: Injection of Isovue-M 200 shows a good epidural pattern with spread above and below the level of needle placement to both sides. No vascular opacification is seen. THERAPEUTIC EPIDURAL INJECTION: 80 mg of Depo-Medrol mixed with 3 mL of 1% lidocaine were instilled. The procedure was well-tolerated, and the patient was discharged thirty minutes following the injection in good condition. COMPLICATIONS: None immediate. IMPRESSION: Technically successful interlaminar epidural injection on the right at L5-S1. Electronically Signed   By: Titus Dubin M.D.   On: 08/12/2022 13:34    Assessment & Plan:   Problem List Items Addressed This Visit     Allergic rhinitis    F/u w/Dr Brown Human Cont on Flonase, Atrovent nasal,  Singulair, Claritin      Dyslipidemia   Relevant Orders   Lipid panel   Gait disorder    Cont w/PT In the balance class      Grief - Primary    Discussed Coping OK      Hypothyroidism    Chronic On Levothroid brand      Low back pain    Using Theraworx foam for burning S/p 2 epid inj Dr Red Christians prescribed Lyrica and Tylenol, stopped Tramadol      Relevant Orders   Comprehensive metabolic panel   Urinalysis   Lipid panel   OAB (overactive bladder)    Use Solifenacin prn      Relevant Orders   Comprehensive metabolic panel   Urinalysis   Lipid panel      Meds ordered this encounter  Medications   LORazepam (ATIVAN) 1 MG tablet    Sig: Take 2-3 tablets at bedtime and 1-2 tablets in the daytime as needed.    Dispense:  150 tablet    Refill:  2      Follow-up: Return in about 3 months (around 01/06/2023) for a follow-up visit.  Walker Kehr, MD

## 2022-10-07 DIAGNOSIS — R2689 Other abnormalities of gait and mobility: Secondary | ICD-10-CM | POA: Diagnosis not present

## 2022-10-07 DIAGNOSIS — M62562 Muscle wasting and atrophy, not elsewhere classified, left lower leg: Secondary | ICD-10-CM | POA: Diagnosis not present

## 2022-10-07 DIAGNOSIS — M62561 Muscle wasting and atrophy, not elsewhere classified, right lower leg: Secondary | ICD-10-CM | POA: Diagnosis not present

## 2022-10-07 DIAGNOSIS — M5416 Radiculopathy, lumbar region: Secondary | ICD-10-CM | POA: Diagnosis not present

## 2022-10-11 ENCOUNTER — Other Ambulatory Visit: Payer: Self-pay | Admitting: Internal Medicine

## 2022-10-11 DIAGNOSIS — R2689 Other abnormalities of gait and mobility: Secondary | ICD-10-CM | POA: Diagnosis not present

## 2022-10-11 DIAGNOSIS — M5416 Radiculopathy, lumbar region: Secondary | ICD-10-CM | POA: Diagnosis not present

## 2022-10-11 DIAGNOSIS — M62561 Muscle wasting and atrophy, not elsewhere classified, right lower leg: Secondary | ICD-10-CM | POA: Diagnosis not present

## 2022-10-11 DIAGNOSIS — M62562 Muscle wasting and atrophy, not elsewhere classified, left lower leg: Secondary | ICD-10-CM | POA: Diagnosis not present

## 2022-10-13 DIAGNOSIS — H532 Diplopia: Secondary | ICD-10-CM | POA: Diagnosis not present

## 2022-10-14 DIAGNOSIS — M62562 Muscle wasting and atrophy, not elsewhere classified, left lower leg: Secondary | ICD-10-CM | POA: Diagnosis not present

## 2022-10-14 DIAGNOSIS — M5416 Radiculopathy, lumbar region: Secondary | ICD-10-CM | POA: Diagnosis not present

## 2022-10-14 DIAGNOSIS — R2689 Other abnormalities of gait and mobility: Secondary | ICD-10-CM | POA: Diagnosis not present

## 2022-10-14 DIAGNOSIS — M62561 Muscle wasting and atrophy, not elsewhere classified, right lower leg: Secondary | ICD-10-CM | POA: Diagnosis not present

## 2022-10-18 DIAGNOSIS — M62561 Muscle wasting and atrophy, not elsewhere classified, right lower leg: Secondary | ICD-10-CM | POA: Diagnosis not present

## 2022-10-18 DIAGNOSIS — M62562 Muscle wasting and atrophy, not elsewhere classified, left lower leg: Secondary | ICD-10-CM | POA: Diagnosis not present

## 2022-10-18 DIAGNOSIS — M5416 Radiculopathy, lumbar region: Secondary | ICD-10-CM | POA: Diagnosis not present

## 2022-10-18 DIAGNOSIS — R2689 Other abnormalities of gait and mobility: Secondary | ICD-10-CM | POA: Diagnosis not present

## 2022-10-20 DIAGNOSIS — H532 Diplopia: Secondary | ICD-10-CM | POA: Diagnosis not present

## 2022-10-25 DIAGNOSIS — R2689 Other abnormalities of gait and mobility: Secondary | ICD-10-CM | POA: Diagnosis not present

## 2022-10-25 DIAGNOSIS — M5416 Radiculopathy, lumbar region: Secondary | ICD-10-CM | POA: Diagnosis not present

## 2022-10-25 DIAGNOSIS — M62562 Muscle wasting and atrophy, not elsewhere classified, left lower leg: Secondary | ICD-10-CM | POA: Diagnosis not present

## 2022-10-25 DIAGNOSIS — M62561 Muscle wasting and atrophy, not elsewhere classified, right lower leg: Secondary | ICD-10-CM | POA: Diagnosis not present

## 2022-10-25 NOTE — Assessment & Plan Note (Signed)
Discussed Coping OK

## 2022-10-25 NOTE — Assessment & Plan Note (Signed)
Cont w/PT In the balance class

## 2022-10-25 NOTE — Assessment & Plan Note (Signed)
Chronic On Levothroid brand

## 2022-10-25 NOTE — Assessment & Plan Note (Signed)
F/u w/Dr Brown Human Cont on Flonase, Atrovent nasal, Singulair, Claritin

## 2022-10-26 DIAGNOSIS — J3089 Other allergic rhinitis: Secondary | ICD-10-CM | POA: Diagnosis not present

## 2022-10-26 DIAGNOSIS — J4520 Mild intermittent asthma, uncomplicated: Secondary | ICD-10-CM | POA: Diagnosis not present

## 2022-10-26 DIAGNOSIS — J301 Allergic rhinitis due to pollen: Secondary | ICD-10-CM | POA: Diagnosis not present

## 2022-10-26 DIAGNOSIS — T63441D Toxic effect of venom of bees, accidental (unintentional), subsequent encounter: Secondary | ICD-10-CM | POA: Diagnosis not present

## 2022-10-27 ENCOUNTER — Telehealth: Payer: Self-pay | Admitting: Internal Medicine

## 2022-10-27 DIAGNOSIS — R413 Other amnesia: Secondary | ICD-10-CM

## 2022-10-27 NOTE — Telephone Encounter (Signed)
Patient called and stated she is having some memory issues and would like to see Dr. Sherrilyn Rist at [facility] at [employer] She says she called and they told her she needs a referral. She would like to know if Dr. Alain Marion could provide this for her. A good callback number for her is [phone].

## 2022-10-28 NOTE — Progress Notes (Signed)
Office Visit Note  Patient: [name]             Date of Birth: 01-Aug-1936           MRN: [id number]             PCP: Cassandria Anger, MD Referring: Cassandria Anger, MD Visit Date: 11/10/2022 Occupation: '@GUAROCC'$ @  Subjective:  Lower back pain  History of Present Illness: [name] is a 86 y.o. female with history of osteoarthritis and degenerative disc disease.  She went to see Dr. Reatha Armour  for lower back pain and had MRI of her lumbar spine.  Since then she has had 3 epidural injections in her lumbar spine.  She states the last injection helped.  She continued to have some burning sensation in her right lower extremity for which she has been using a topical agent which has been helpful.  For the last week she has been having discomfort in her left gluteal region.  She states that he prescribed pregabalin 25 mg p.o. nightly along with Tylenol which she has been taking throughout the day.  She is concerned the pregabalin could be causing imbalance.  Should be contacting Dr. Reatha Armour.  Activities of Daily Living:  Patient reports morning stiffness for 0  none .   Patient Reports nocturnal pain.  Difficulty dressing/grooming: Denies Difficulty climbing stairs: Denies Difficulty getting out of chair: Denies Difficulty using hands for taps, buttons, cutlery, and/or writing: Denies  Review of Systems  Constitutional:  Positive for fatigue.  HENT:  Positive for mouth dryness. Negative for mouth sores.   Eyes:  Positive for dryness.  Respiratory:  Negative for shortness of breath.   Cardiovascular:  Negative for chest pain and palpitations.  Gastrointestinal:  Negative for blood in stool, constipation and diarrhea.  Endocrine: Positive for increased urination.  Genitourinary:  Positive for involuntary urination.  Musculoskeletal:  Positive for gait problem. Negative for joint pain, joint pain, joint swelling, myalgias, muscle weakness, morning stiffness, muscle tenderness  and myalgias.  Skin:  Positive for sensitivity to sunlight. Negative for color change, rash and hair loss.  Allergic/Immunologic: Negative for susceptible to infections.  Neurological:  Negative for dizziness and headaches.  Hematological:  Negative for swollen glands.  Psychiatric/Behavioral:  Positive for depressed mood. Negative for sleep disturbance. The patient is nervous/anxious.     PMFS History:  Patient Active Problem List   Diagnosis Date Noted   Memory impairment 11/08/2022   Grief 10/06/2022   History of basal cell carcinoma 09/29/2022   Open forehead wound 07/21/2022   Stress at home 07/21/2022   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   Gait disorder 02/28/2022   History of malignant neoplasm of skin 02/22/2022   Melanocytic nevi of trunk 02/22/2022   Seborrheic dermatitis 02/22/2022   Urticaria 02/22/2022   Irregular bowel habits 11/18/2021   Scalp laceration, sequela 11/02/2021   Concussion 10/29/2021   Scalp itch 08/19/2021   Allergic rhinitis due to animal (cat) (dog) hair and dander 08/07/2021   Allergic rhinitis due to pollen 08/07/2021   Mild intermittent asthma 08/07/2021   Closed fracture of first cervical vertebra ([place]) 08/07/2021   Closed fracture of second cervical vertebra ([place]) 08/07/2021   Cervical spine instability 05/15/2021   Closed 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 05/13/2021   Cervical spinal cord compression ([place]) 05/04/2021   Fall against object 03/25/2021   Hematoma of face, initial encounter 03/25/2021   Degenerative disc disease, cervical 03/11/2021   Spondylolisthesis,  cervical region 03/11/2021   Trochanteric bursitis of right hip 09/22/2020   Elevated blood-pressure reading, without diagnosis of hypertension 07/14/2020   Meningioma ([place]) 03/25/2020   Arthralgia 03/18/2020   RUQ pain 01/02/2020   Cholelithiasis 09/09/2019   Ear pain, bilateral 03/21/2019   Cervical spondylosis 01/05/2019   Neck pain  12/26/2018   Acute pain of left shoulder 12/26/2018   Anxiety 08/22/2018   Contact dermatitis and eczema 06/14/2018   Constipation 11/11/2016   Low back pain 11/10/2016   Leg abrasion 08/12/2016   Contusion of right knee 08/12/2016   Sinusitis, chronic 08/06/2016   Rash and nonspecific skin eruption 04/20/2016   Aortic valve sclerosis 03/12/2015   Contusion of left chest wall 01/09/2015   Fatigue 09/08/2014   Bladder pain 08/19/2014   Increased frequency of urination 08/19/2014   Urinary urgency 08/19/2014   Postherpetic neuralgia 10/02/2013   Impacted cerumen of right ear 07/16/2013   Headache 07/10/2013   Aortic stenosis 04/19/2013   Insomnia 01/24/2013   Cough 08/07/2012   URI (upper respiratory infection) 08/03/2012   Pruritus of skin 03/11/2012   MVP (mitral valve prolapse)    Multiple thyroid nodules 10/04/2011   Herpes zoster 09/07/2011   Essential hypertension, benign 07/27/2011   Atrophic vaginitis    Well adult exam 03/15/2011   Hyperlipemia, mixed 03/15/2011   OAB (overactive bladder) 09/02/2010   Hypothyroidism 03/03/2010   CALF PAIN, LEFT 10/27/2009   Belching 07/21/2009   EAR PAIN 12/19/2008   Allergic rhinitis 12/19/2008   Irritable bowel syndrome 09/04/2008   PERSONAL HX COLONIC POLYPS 09/04/2008   GASTRITIS, CHRONIC 09/03/2008   DUODENITIS, WITHOUT HEMORRHAGE 09/03/2008   TIBIALIS TENDINITIS 03/20/2008   Osteoarthritis 02/25/2008   SWEATING 02/21/2008   LACTOSE INTOLERANCE 11/20/2007   Dyslipidemia 11/20/2007   GERD 11/20/2007   Osteoporosis 11/20/2007    Past Medical History:  Diagnosis Date   Allergic rhinitis    Asthma    Atrophic vaginitis    Baker's cyst    Left-Dr. Aluisio   Cataract    Dr. Katy Fitch   CHF (congestive heart failure) (HCC)    Colon polyps    Fibroid    Gastritis    chronic   GERD (gastroesophageal reflux disease)    Heart murmur    Hemorrhoids    Hyperlipidemia    Hypothyroidism    IBS (irritable bowel syndrome)     Lumbar spondylosis    MVP (mitral valve prolapse)    Antibiotics required for dental procedures   OA (osteoarthritis)    Osteoporosis 06/2018   T score -2.2 stable on Prolia   Overactive bladder    Sacroiliitis (HCC)    Thyroid disease    Thyroid nodule    Torn meniscus    bilateral    Family History  Problem Relation Age of Onset   Heart disease Mother    Hypertension Mother    Osteoporosis Mother    Congestive Heart Failure Mother    Pancreatic cancer Father    Heart attack Brother    Drug abuse Brother        over dose    Healthy Son    Healthy Son    Colon cancer Neg Hx    Colon polyps Neg Hx    Rectal cancer Neg Hx    Stomach cancer Neg Hx    Past Surgical History:  Procedure Laterality Date   CATARACT EXTRACTION, BILATERAL     EYE SURGERY     POSTERIOR CERVICAL FUSION/FORAMINOTOMY N/A 05/15/2021  Procedure: Cervical One Laminectomy with resection of cyst, Fixation from Occiput to Cervical Four;  Surgeon: Erline Levine, MD;  Location: [facility];  Service: Neurosurgery;  Laterality: N/A;   TONSILLECTOMY     Social History   Social History Narrative   Regular exercise-yesDaily caffeine use   Right handed   Lives alone   retired   Immunization History  Administered Date(s) Administered   COVID-19, mRNA, vaccine(Comirnaty)12 years and older 09/29/2022   Fluad Quad(high Dose 65+) 08/04/2019, 09/16/2021, 09/12/2022   Influenza Whole 09/29/2007, 08/21/2008, 09/02/2010, 07/30/2012   Influenza, High Dose Seasonal PF 09/03/2013, 02/19/2015, 11/12/2015, 08/06/2016, 11/11/2016, 08/20/2017, 08/31/2017, 09/20/2018, 09/12/2019   Influenza,inj,Quad PF,6+ Mos 12/17/2014, 07/30/2015   PFIZER(Purple Top)SARS-COV-2 Vaccination 12/12/2019, 12/31/2019, 07/29/2020, 07/29/2020, 07/31/2020   Pfizer Covid-19 Vaccine Bivalent Booster 32yr & up 08/21/2021   Pneumococcal Conjugate-13 01/09/2014   Pneumococcal Polysaccharide-23 09/26/2006, 08/12/2015, 11/11/2016, 08/31/2017,  09/10/2020, 09/10/2021   Td 04/14/2010   Tdap 06/18/2020   Typhoid Inactivated 02/23/2013   Zoster, Live 12/09/2006     Objective: Vital Signs: BP 121/71 (BP Location: Right Arm, Patient Position: Sitting, Cuff Size: Small)   Pulse 87   Resp 13   Ht '4\' 11"'$  (1.499 m)   Wt 115 lb (52.2 kg)   BMI 23.23 kg/m    Physical Exam Vitals and nursing note reviewed.  Constitutional:      Appearance: She is well-developed.  HENT:     Head: Normocephalic and atraumatic.  Eyes:     Conjunctiva/sclera: Conjunctivae normal.  Cardiovascular:     Rate and Rhythm: Normal rate and regular rhythm.     Heart sounds: Normal heart sounds.  Pulmonary:     Effort: Pulmonary effort is normal.     Breath sounds: Normal breath sounds.  Abdominal:     General: Bowel sounds are normal.     Palpations: Abdomen is soft.  Musculoskeletal:     Cervical back: Normal range of motion.  Lymphadenopathy:     Cervical: No cervical adenopathy.  Skin:    General: Skin is warm and dry.     Capillary Refill: Capillary refill takes less than 2 seconds.  Neurological:     Mental Status: She is alert and oriented to person, place, and time.  Psychiatric:        Behavior: Behavior normal.      Musculoskeletal Exam: Patient had limited range of motion of the cervical spine due to cervical fusion.  She had limited painful range of motion of lumbar spine.  Shoulder joints, elbow joints, wrist joints with good range of motion.  She had bilateral PIP and DIP thickening with no synovitis.  Hip joints were in good range of motion.  She had tenderness over left piriformis region.  Knee joints were in good range of motion without any warmth swelling or effusion.  CDAI Exam: CDAI Score: -- Patient Global: --; Provider Global: -- Swollen: --; Tender: -- Joint Exam 11/10/2022   No joint exam has been documented for this visit   There is currently no information documented on the homunculus. Go to the Rheumatology  activity and complete the homunculus joint exam.  Investigation: No additional findings.  Imaging: No results found.  Recent Labs: Lab Results  Component Value Date   WBC 7.8 09/29/2022   HGB 12.5 09/29/2022   PLT 352.0 09/29/2022   NA 133 (L) 09/29/2022   K 4.1 09/29/2022   CL 99 09/29/2022   CO2 29 09/29/2022   GLUCOSE 102 (H) 09/29/2022   BUN  17 09/29/2022   CREATININE 0.69 09/29/2022   BILITOT 0.4 09/29/2022   ALKPHOS 35 (L) 09/29/2022   AST 21 09/29/2022   ALT 29 09/29/2022   PROT 7.3 09/29/2022   ALBUMIN 4.3 09/29/2022   CALCIUM 9.2 09/29/2022   GFRAA 87 07/17/2020    Speciality Comments: No specialty comments available.  Procedures:  No procedures performed Allergies: Bee venom, Bacitracin-polymyxin b, Clarithromycin, Doxycycline, Neosporin [neomycin-bacitracin zn-polymyx], and Hydrocortisone   Assessment / Plan:     Visit Diagnoses: Chronic SI joint pain-she has been having increased left SI joint pain and left piriformis pain.  She saw Dr. Reatha Armour yesterday.  He recommended to avoid physical therapy for couple of weeks.  Stretching exercises were demonstrated.  Primary osteoarthritis of both hands-she has severe arthritis in her hands especially involving her DIP joints.  Joint protection muscle strengthening was discussed.  Primary osteoarthritis of both feet-she has been wearing shoes to accommodate arthritic feet.  DDD (degenerative disc disease), cervical - She had fusion in the past by Dr. Vertell Limber.  She has limited range of motion of the cervical spine.  DDD (degenerative disc disease), lumbar-she has been sprains increased lower back pain.  She states she had relief from epidural injections x 3.  She still have some intermittent paresthesias in her right lower extremity.  She states she has been using some topical foam which helps her.  Core strengthening exercises were discussed.  Gait instability-patient states that she has been struggling with gait  instability for a while for which she she was going to physical therapy.  She is holding physical therapy for couple of weeks.  She is concerned that pregabalin could be contributing to her symptoms.  She plans to discuss this further with Dr. Reatha Armour.  Age-related osteoporosis without current pathological fracture - she is on Prolia by her GYN.  She had read sent DEXA scan and the results are pending.  Use of calcium rich diet with vitamin D was discussed.  Regular exercise was emphasized.  Essential hypertension, benign-blood pressure was normal today.  History of IBS  History of gastroesophageal reflux (GERD)  History of hyperlipidemia  Hypothyroidism due to acquired atrophy of thyroid  Orders: No orders of the defined types were placed in this encounter.  No orders of the defined types were placed in this encounter.    Follow-Up Instructions: Return in about 3 months (around 02/09/2023) for Osteoarthritis.   Bo Merino, MD  Note - This record has been created using Editor, commissioning.  Chart creation errors have been sought, but may not always  have been located. Such creation errors do not reflect on  the standard of medical care.

## 2022-10-29 NOTE — Telephone Encounter (Signed)
Okay. Thank you.

## 2022-10-29 NOTE — Telephone Encounter (Signed)
Notified pt w/MD response.../lmb 

## 2022-11-01 DIAGNOSIS — M5416 Radiculopathy, lumbar region: Secondary | ICD-10-CM | POA: Diagnosis not present

## 2022-11-01 DIAGNOSIS — M62562 Muscle wasting and atrophy, not elsewhere classified, left lower leg: Secondary | ICD-10-CM | POA: Diagnosis not present

## 2022-11-01 DIAGNOSIS — R2689 Other abnormalities of gait and mobility: Secondary | ICD-10-CM | POA: Diagnosis not present

## 2022-11-01 DIAGNOSIS — M62561 Muscle wasting and atrophy, not elsewhere classified, right lower leg: Secondary | ICD-10-CM | POA: Diagnosis not present

## 2022-11-02 ENCOUNTER — Ambulatory Visit (INDEPENDENT_AMBULATORY_CARE_PROVIDER_SITE_OTHER): Payer: Medicare Other

## 2022-11-02 ENCOUNTER — Other Ambulatory Visit: Payer: Self-pay | Admitting: Obstetrics and Gynecology

## 2022-11-02 DIAGNOSIS — N952 Postmenopausal atrophic vaginitis: Secondary | ICD-10-CM

## 2022-11-02 DIAGNOSIS — M816 Localized osteoporosis [Lequesne]: Secondary | ICD-10-CM

## 2022-11-02 DIAGNOSIS — Z13820 Encounter for screening for osteoporosis: Secondary | ICD-10-CM

## 2022-11-02 DIAGNOSIS — Z780 Asymptomatic menopausal state: Secondary | ICD-10-CM

## 2022-11-02 DIAGNOSIS — Z8739 Personal history of other diseases of the musculoskeletal system and connective tissue: Secondary | ICD-10-CM

## 2022-11-02 DIAGNOSIS — Z01419 Encounter for gynecological examination (general) (routine) without abnormal findings: Secondary | ICD-10-CM

## 2022-11-02 DIAGNOSIS — R351 Nocturia: Secondary | ICD-10-CM

## 2022-11-02 DIAGNOSIS — E2839 Other primary ovarian failure: Secondary | ICD-10-CM

## 2022-11-08 ENCOUNTER — Encounter: Payer: Self-pay | Admitting: Physician Assistant

## 2022-11-08 ENCOUNTER — Other Ambulatory Visit (INDEPENDENT_AMBULATORY_CARE_PROVIDER_SITE_OTHER): Payer: Medicare Other

## 2022-11-08 ENCOUNTER — Ambulatory Visit (INDEPENDENT_AMBULATORY_CARE_PROVIDER_SITE_OTHER): Payer: Medicare Other | Admitting: Physician Assistant

## 2022-11-08 ENCOUNTER — Other Ambulatory Visit: Payer: Medicare Other

## 2022-11-08 VITALS — BP 127/71 | HR 89 | Resp 18 | Ht 59.0 in | Wt 115.0 lb

## 2022-11-08 DIAGNOSIS — R413 Other amnesia: Secondary | ICD-10-CM

## 2022-11-08 DIAGNOSIS — M47816 Spondylosis without myelopathy or radiculopathy, lumbar region: Secondary | ICD-10-CM | POA: Diagnosis not present

## 2022-11-08 DIAGNOSIS — M461 Sacroiliitis, not elsewhere classified: Secondary | ICD-10-CM | POA: Diagnosis not present

## 2022-11-08 LAB — VITAMIN B12: Vitamin B-12: 229 pg/mL (ref 211–911)

## 2022-11-08 LAB — TSH: TSH: 2.05 u[IU]/mL (ref 0.35–5.50)

## 2022-11-08 MED ORDER — DONEPEZIL HCL 10 MG PO TABS: ORAL_TABLET | ORAL | 11 refills | Status: DC

## 2022-11-08 MED ORDER — DONEPEZIL HCL 10 MG PO TABS: 10.0000 mg | ORAL_TABLET | Freq: Every day | ORAL | 3 refills | Status: DC

## 2022-11-08 NOTE — Progress Notes (Signed)
Her B12 is on the lower side, at 229, we like it between 400 and 1000, start taking B12 at 1000 mcg daily, follow with Dr Alain Marion Her thyroid levels are normal, thanks

## 2022-11-08 NOTE — Progress Notes (Signed)
Assessment/Plan:    The patient is seen in neurologic consultation at the request of Plotnikov, Evie Lacks, MD for the evaluation of memory.  [name] is a very pleasant 86 y.o. year old RH female with  a history of hypertension, h/o posterior L frontal meningioma (16 x 11 by 17 mmm on 03/2021 MRI) , hyperlipidemia, anxiety, grief situational depression, hypothyroidism, GERD, IBS, OA, seen today for evaluation of memory loss. MoCA today is 22/30. Workup is in progress   Memory Impairment  MRI brain without contrast to assess for underlying structural abnormality and assess vascular load  Neurocognitive testing to further evaluate cognitive concerns and determine other underlying cause of memory changes, including potential contribution from sleep, anxiety, or depression  Start Donepezil 10 mg after clearance with cardiology  :Take half tablet (5 mg) daily for 2 weeks, then increase to the full tablet at 10 mg daily. Check B12, TSH Folllow up  in 3 months   Subjective:    The patient is here alone    How long did patient have memory difficulties? [name] , her daughter told her that for the last 6 months she was "forgetting things 10 minutes later" so now she writes what she needs to remember in a pad. However, she never forgets meds or appt's, but s he may experience some difficulties with recent conversation someone's names. For a few weeks she was not active, trying to adjust to her loss, but recently" I just started to read again, watch Netflix."    repeats oneself? Denies  Disoriented when walking into a room?  Patient denies   Leaving objects in unusual places? " I am always leaving my phone everywhere" but not in unusual places Ambulates  with difficulty?   She has neuropathy and radiculopathy, so she is "a little wobbly", she was on gabapentin, now on Lyrica. She is doing PT and balance therapy "and it helps"  Recent falls? Recently she had a mechanical fall after tripping over  an object and sustaining a head injury with 1 inch laceration above the L eye and some skin tear on the R knee, no LOC, "just a little concussion".  History of seizures?   Patient denies   Wandering behavior?  Patient denies   Patient drives?   Patient no longer drives Any personality changes?  She reports more anxiety, I know he (her husband [name])  is not here, but I talk to him good morning because it is comforting " Any history of depression?: She is experiencing grief depression as her husband of 69 years died on 2023-09-17 . She is starting therapy Geneticist, molecular) and will start grief group at [facility] this week.  Hallucinations or paranoia?  Patient denies   She has mild insomnia after her husband death. She falls asleep with the TV on. Denies vivid dreams except one time she dreamt about [name] "it was nice", denies REM behavior or sleepwalking . History of sleep apnea?  Patient denies   Any hygiene concerns?  Patient denies   Independent of bathing and dressing?  Endorsed  Does the patient needs help with medications? Patient is in charge  Who is in charge of the finances?  Patient is in charge   Any changes in appetite?  Patient denies   Patient have trouble swallowing? Patient denies, I live in [place], I don't have to Does the patient cook? Any kitchen accidents such as leaving the stove on?  Patient denies   Any  headaches?  Patient denies   Double vision? Patient denies   Any focal numbness or tingling? She has chronic paresthesias.  Chronic pain?  She has some R  buttock, RLE pain due to radiculopathy, on LS injections by NS, gabapentin and tramadol prn..  She has a history of C spine fusion as well.  Unilateral weakness?  Patient denies   Any tremors?  Patient denies   Any history of anosmia?  Patient denies   Any incontinence of urine?  She reports nocturia  Any bowel dysfunction?   Patient denies    History of heavy alcohol intake?  Patient denies   History of heavy  tobacco use?  Patient denies   Family history of dementia?   Negative for  Alzheimer's disease   Patient lives:  I. L at [facility], her 2 sons live in [place]  Pertinent studies 2 D echo  EF 60-65, mild AI, no sign AS    Allergies  Allergen Reactions   Bee Venom Itching, Swelling and Rash    Itching, swelling and rash with bee stings, patient has epi pen   Bacitracin-Polymyxin B Other (See Comments)   Clarithromycin Other (See Comments)   Doxycycline     ??   Neosporin [Neomycin-Bacitracin Zn-Polymyx]    Hydrocortisone Rash    Current Outpatient Medications  Medication Instructions   acetaminophen (TYLENOL) 1,000 mg, Oral, Every 6 hours PRN   albuterol (VENTOLIN HFA) 108 (90 Base) MCG/ACT inhaler Take 2 puffs every 4-6 hours as needed   Azelastine HCl 0.15 % SOLN Each Nare, Place into both nostrils.   Calcium Carbonate-Vitamin D (CALCIUM-VITAMIN D) 500-200 MG-UNIT per tablet 3 tablets, Oral, Daily   desonide (DESOWEN) 2.84 % cream 1 application , Topical, As needed   diclofenac Sodium (VOLTAREN) 1 % GEL APPLY 4 GRAMS 4 TIMES DAILY.   donepezil (ARICEPT) 10 MG tablet Start Donepezil 10 mg :Take half tablet (5 mg) daily for 2 weeks, then increase to the full tablet at 10 mg daily. Side effects discussed   EPINEPHrine (EPIPEN 2-PAK) 0.3 mg, Intramuscular, As needed   estradiol (ESTRACE) 0.1 MG/GM vaginal cream Place 1 gram vaginally 2 x a week at hs   famotidine (PEPCID) 40 mg, Oral, Daily at bedtime, NEEDS OFFICE VISIT FOR ADDITIONAL REFILLS   ipratropium (ATROVENT) 0.06 % nasal spray 1 spray, Nasal, 3 times daily PRN   levocetirizine (XYZAL) 5 mg, Oral, Every evening   levothyroxine (SYNTHROID) 50 MCG tablet TAKE ONE TABLET BY MOUTH DAILY BEFORE BREAKFAST   LORazepam (ATIVAN) 1 MG tablet Take 2-3 tablets at bedtime and 1-2 tablets in the daytime as needed.   methocarbamol (ROBAXIN) 500 mg, Oral, At bedtime PRN   montelukast (SINGULAIR) 10 mg, Oral, Daily at bedtime   pantoprazole  (PROTONIX) 40 MG tablet TAKE (1) TABLET TWICE A DAY BEFORE MEALS.   polyethylene glycol powder (GLYCOLAX/MIRALAX) 17 g, Oral, 2 times daily PRN   pregabalin (LYRICA) 25 MG capsule take 1 capsule by oral route 2 times every day; start 1 qhsX5 days, then BID   simvastatin (ZOCOR) 40 mg, Oral, Daily   triamcinolone (NASACORT) 55 MCG/ACT AERO nasal inhaler 2 sprays, Nasal, As needed   triamcinolone cream (KENALOG) 0.1 % 1 application , Topical, 2 times daily, APPLY TOPICALLY TWICE A DAY   VESIcare 10 mg, Oral, Daily PRN     VITALS:   Vitals:   11/08/22 0745  BP: 127/71  Pulse: 89  Resp: 18  SpO2: 95%  Weight: 115 lb (52.2  kg)  Height: '4\' 11"'$  (1.499 m)     PHYSICAL EXAM   HEENT:  Normocephalic, atraumatic. The mucous membranes are moist. The superficial temporal arteries are without ropiness or tenderness. Cardiovascular: Regular rate and rhythm. Lungs: Clear to auscultation bilaterally. Neck: There are no carotid bruits noted bilaterally.  NEUROLOGICAL:    11/08/2022    7:00 PM  Montreal Cognitive Assessment   Visuospatial/ Executive (0/5) 1  Naming (0/3) 1  Attention: Read list of digits (0/2) 2  Attention: Read list of letters (0/1) 1  Attention: Serial 7 subtraction starting at 100 (0/3) 3  Language: Repeat phrase (0/2) 2  Language : Fluency (0/1) 1  Abstraction (0/2) 1  Delayed Recall (0/5) 3  Orientation (0/6) 6  Total 21  Adjusted Score (based on education) 22       02/10/2017    1:39 PM  MMSE - Mini Mental State Exam  Orientation to time 5  Orientation to Place 5  Registration 3  Attention/ Calculation 5  Recall 2  Language- name 2 objects 2  Language- repeat 1  Language- follow 3 step command 3  Language- read & follow direction 1  Write a sentence 1  Copy design 1  Total score 29     Orientation:  Alert and oriented to person, place and time. No aphasia or dysarthria. Fund of knowledge is appropriate. Recent memory impaired and remote memory  intact.  Attention and concentration are normal.  Able to name objects and repeat phrases. Delayed recall  3/5 Cranial nerves: There is good facial symmetry. Extraocular muscles are intact and visual fields are full to confrontational testing. Speech is fluent and clear. Soft palate rises symmetrically and there is no tongue deviation. Hearing is intact to conversational tone. Tone: Tone is good throughout. Sensation: Sensation is intact to light touch and pinprick throughout. Vibration is intact at the bilateral big toe.There is no extinction with double simultaneous stimulation. There is no sensory dermatomal level identified. Coordination: The patient has no difficulty with RAM's or FNF bilaterally. Normal finger to nose  Motor: Strength is 5/5 in the bilateral upper and lower extremities. There is no pronator drift. There are no fasciculations noted. DTR's: Deep tendon reflexes are 2/4 at the bilateral biceps, triceps, brachioradialis, patella and achilles.  Plantar responses are downgoing bilaterally. Gait and Station: The patient is able to ambulate without difficulty.The patient is able to ambulate in a tandem fashion, able to stand in the Romberg position.     Thank you for allowing [place] the opportunity to participate in the care of this nice patient. Please do not hesitate to contact us for any questions or concerns.   Total time spent on today's visit was 7 minutes dedicated to this patient today, preparing to see patient, examining the patient, ordering tests and/or medications and counseling the patient, documenting clinical information in the EHR or other health record, independently interpreting results and communicating results to the patient/family, discussing treatment and goals, answering patient's questions and coordinating care.  Cc:  Plotnikov, Evie Lacks, MD  Sharene Butters 11/08/2022 7:30 PM

## 2022-11-08 NOTE — Patient Instructions (Addendum)
It was a pleasure to see you today at our office. You have memory impairment with cause being unknown but we are working  on it but the workup is in progress as listed below.   Recommendations:  Neurocognitive evaluation at our office MRI of the brain, the radiology office will call you to arrange you appointment Check labs today Start Donepezil 10 mg after clearance with cardiology  :Take half tablet (5 mg) daily for 2 weeks, then increase to the full tablet at 10 mg daily. Follow up in 3 months   Whom to call:  Memory  decline, memory medications: Call our office [phone]   For psychiatric meds, mood meds: Please have your primary care physician manage these medications.    For assessment of decision of mental capacity and competency:  Call Dr. Anthoney Harada, geriatric psychiatrist at [phone]  For guidance in geriatric dementia issues please call [facility] [phone]     If you have any severe symptoms of a stroke, or other severe issues such as confusion,severe chills or fever, etc call 911 or go to the ER as you may need to be evaluated further         RECOMMENDATIONS FOR ALL PATIENTS WITH MEMORY PROBLEMS: 1. Continue to exercise (Recommend 30 minutes of walking everyday, or 3 hours every week) 2. Increase social interactions - continue going to [place] and enjoy social gatherings with friends and family 3. Eat healthy, avoid fried foods and eat more fruits and vegetables 4. Maintain adequate blood pressure, blood sugar, and blood cholesterol level. Reducing the risk of stroke and cardiovascular disease also helps promoting better memory. 5. Avoid stressful situations. Live a simple life and avoid aggravations. Organize your time and prepare for the next day in anticipation. 6. Sleep well, avoid any interruptions of sleep and avoid any distractions in the bedroom that may interfere with adequate sleep quality 7. Avoid sugar, avoid sweets as there is a  strong link between excessive sugar intake, diabetes, and cognitive impairment We discussed the Mediterranean diet, which has been shown to help patients reduce the risk of progressive memory disorders and reduces cardiovascular risk. This includes eating fish, eat fruits and green leafy vegetables, nuts like almonds and hazelnuts, walnuts, and also use olive oil. Avoid fast foods and fried foods as much as possible. Avoid sweets and sugar as sugar use has been linked to worsening of memory function.  There is always a concern of gradual progression of memory problems. If this is the case, then we may need to adjust level of care according to patient needs. Support, both to the patient and caregiver, should then be put into place.      You have been referred for a neuropsychological evaluation (i.e., evaluation of memory and thinking abilities). Please bring someone with you to this appointment if possible, as it is helpful for the doctor to hear from both you and another adult who knows you well. Please bring eyeglasses and hearing aids if you wear them.    The evaluation will take approximately 3 hours and has two parts:   The first part is a clinical interview with the neuropsychologist (Dr. Melvyn Novas or Dr. Nicole Kindred). During the interview, the neuropsychologist will speak with you and the individual you brought to the appointment.    The second part of the evaluation is testing with the doctor's technician Hinton Dyer or Maudie Mercury). During the testing, the technician will ask you to remember different types of material, solve problems, and  answer some questionnaires. Your family member will not be present for this portion of the evaluation.   Please note: We must reserve several hours of the neuropsychologist's time and the psychometrician's time for your evaluation appointment. As such, there is a No-Show fee of $100. If you are unable to attend any of your appointments, please contact our office as soon as  possible to reschedule.    FALL PRECAUTIONS: Be cautious when walking. Scan the area for obstacles that may increase the risk of trips and falls. When getting up in the mornings, sit up at the edge of the bed for a few minutes before getting out of bed. Consider elevating the bed at the head end to avoid drop of blood pressure when getting up. Walk always in a well-lit room (use night lights in the walls). Avoid area rugs or power cords from appliances in the middle of the walkways. Use a walker or a cane if necessary and consider physical therapy for balance exercise. Get your eyesight checked regularly.  FINANCIAL OVERSIGHT: Supervision, especially oversight when making financial decisions or transactions is also recommended.  HOME SAFETY: Consider the safety of the kitchen when operating appliances like stoves, microwave oven, and blender. Consider having supervision and share cooking responsibilities until no longer able to participate in those. Accidents with firearms and other hazards in the house should be identified and addressed as well.   ABILITY TO BE LEFT ALONE: If patient is unable to contact 911 operator, consider using LifeLine, or when the need is there, arrange for someone to stay with patients. Smoking is a fire hazard, consider supervision or cessation. Risk of wandering should be assessed by caregiver and if detected at any point, supervision and safe proof recommendations should be instituted.  MEDICATION SUPERVISION: Inability to self-administer medication needs to be constantly addressed. Implement a mechanism to ensure safe administration of the medications.   DRIVING: Regarding driving, in patients with progressive memory problems, driving will be impaired. We advise to have someone else do the driving if trouble finding directions or if minor accidents are reported. Independent driving assessment is available to determine safety of driving.   If you are interested in the  driving assessment, you can contact the following:  [employer] in [place]  [place] [phone] or [phone]    [place] refers to food and lifestyle choices that are based on the traditions of countries located on the [employer]. This way of eating has been shown to help prevent certain conditions and improve outcomes for people who have chronic diseases, like kidney disease and heart disease. What are tips for following this plan? Lifestyle  Cook and eat meals together with your family, when possible. Drink enough fluid to keep your urine clear or pale yellow. Be physically active every day. This includes: Aerobic exercise like running or swimming. Leisure activities like gardening, walking, or housework. Get 7-8 hours of sleep each night. If recommended by your health care provider, drink red wine in moderation. This means 1 glass a day for nonpregnant women and 2 glasses a day for men. A glass of wine equals 5 oz (150 mL). Reading food labels  Check the serving size of packaged foods. For foods such as rice and pasta, the serving size refers to the amount of cooked product, not dry. Check the total fat in packaged foods. Avoid foods that have saturated fat or  trans fats. Check the ingredients list for added sugars, such as corn syrup. Shopping  At the grocery store, buy most of your food from the areas near the walls of the store. This includes: Fresh fruits and vegetables (produce). Grains, beans, nuts, and seeds. Some of these may be available in unpackaged forms or large amounts (in bulk). Fresh seafood. Poultry and eggs. Low-fat dairy products. Buy whole ingredients instead of prepackaged foods. Buy fresh fruits and vegetables in-season from local farmers markets. Buy frozen fruits and vegetables in resealable  bags. If you do not have access to quality fresh seafood, buy precooked frozen shrimp or canned fish, such as tuna, salmon, or sardines. Buy small amounts of raw or cooked vegetables, salads, or olives from the deli or salad bar at your store. Stock your pantry so you always have certain foods on hand, such as olive oil, canned tuna, canned tomatoes, rice, pasta, and beans. Cooking  Cook foods with extra-virgin olive oil instead of using butter or other vegetable oils. Have meat as a side dish, and have vegetables or grains as your main dish. This means having meat in small portions or adding small amounts of meat to foods like pasta or stew. Use beans or vegetables instead of meat in common dishes like chili or lasagna. Experiment with different cooking methods. Try roasting or broiling vegetables instead of steaming or sauteing them. Add frozen vegetables to soups, stews, pasta, or rice. Add nuts or seeds for added healthy fat at each meal. You can add these to yogurt, salads, or vegetable dishes. Marinate fish or vegetables using olive oil, lemon juice, garlic, and fresh herbs. Meal planning  Plan to eat 1 vegetarian meal one day each week. Try to work up to 2 vegetarian meals, if possible. Eat seafood 2 or more times a week. Have healthy snacks readily available, such as: Vegetable sticks with hummus. Greek yogurt. Fruit and nut trail mix. Eat balanced meals throughout the week. This includes: Fruit: 2-3 servings a day Vegetables: 4-5 servings a day Low-fat dairy: 2 servings a day Fish, poultry, or lean meat: 1 serving a day Beans and legumes: 2 or more servings a week Nuts and seeds: 1-2 servings a day Whole grains: 6-8 servings a day Extra-virgin olive oil: 3-4 servings a day Limit red meat and sweets to only a few servings a month What are my food choices? Mediterranean diet Recommended Grains: Whole-grain pasta. Brown rice. Bulgar wheat. Polenta. Couscous. Whole-wheat bread.  [place]. Vegetables: Artichokes. Beets. Broccoli. Cabbage. Carrots. Eggplant. Green beans. Chard. Kale. Spinach. Onions. Leeks. Peas. Squash. Tomatoes. Peppers. Radishes. Fruits: Apples. Apricots. Avocado. Berries. Bananas. Cherries. Dates. Figs. Grapes. Lemons. Melon. Oranges. Peaches. Plums. Pomegranate. Meats and other protein foods: Beans. Almonds. Sunflower seeds. Pine nuts. Peanuts. [place]. Salmon. Scallops. Shrimp. [place]. Tilapia. Clams. Oysters. Eggs. Dairy: Low-fat milk. Cheese. Greek yogurt. Beverages: Water. Red wine. Herbal tea. Fats and oils: Extra virgin olive oil. Avocado oil. Grape seed oil. Sweets and desserts: [place] yogurt with honey. Baked apples. Poached pears. Trail mix. Seasoning and other foods: Basil. Cilantro. Coriander. Cumin. Mint. Parsley. Sage. Rosemary. Tarragon. Garlic. Oregano. Thyme. Pepper. Balsalmic vinegar. Tahini. Hummus. Tomato sauce. Olives. Mushrooms. Limit these Grains: Prepackaged pasta or rice dishes. Prepackaged cereal with added sugar. Vegetables: Deep fried potatoes (french fries). Fruits: Fruit canned in syrup. Meats and other protein foods: Beef. Pork. Lamb. Poultry with skin. Hot dogs. [place] Salines. Dairy: Ice cream. Sour cream. Whole milk. Beverages: Juice. Sugar-sweetened soft drinks. Beer. Liquor and spirits. Fats  and oils: Butter. Canola oil. Vegetable oil. Beef fat (tallow). Lard. Sweets and desserts: Cookies. Cakes. Pies. Candy. Seasoning and other foods: Mayonnaise. Premade sauces and marinades. The items listed may not be a complete list. Talk with your dietitian about what dietary choices are right for you. Summary The Mediterranean diet includes both food and lifestyle choices. Eat a variety of fresh fruits and vegetables, beans, nuts, seeds, and whole grains. Limit the amount of red meat and sweets that you eat. Talk with your health care provider about whether it is safe for you to drink red wine in moderation. This means 1 glass a day  for nonpregnant women and 2 glasses a day for men. A glass of wine equals 5 oz (150 mL). This information is not intended to replace advice given to you by your health care provider. Make sure you discuss any questions you have with your health care provider. Document Released: 07/08/2016 Document Revised: 09/12/2 Your provider has requested that you have labwork completed today. Please go to [facility] ([id number]) on the second floor of this building before leaving the office today. You do not need to check in. If you are not called within 15 minutes please check with the front desk.  Your Mri will be At [place]

## 2022-11-10 ENCOUNTER — Ambulatory Visit: Payer: Medicare Other | Attending: Rheumatology | Admitting: Rheumatology

## 2022-11-10 ENCOUNTER — Encounter: Payer: Self-pay | Admitting: Rheumatology

## 2022-11-10 VITALS — BP 121/71 | HR 87 | Resp 13 | Ht 59.0 in | Wt 115.0 lb

## 2022-11-10 DIAGNOSIS — M533 Sacrococcygeal disorders, not elsewhere classified: Secondary | ICD-10-CM | POA: Diagnosis not present

## 2022-11-10 DIAGNOSIS — Z8639 Personal history of other endocrine, nutritional and metabolic disease: Secondary | ICD-10-CM

## 2022-11-10 DIAGNOSIS — M810 Age-related osteoporosis without current pathological fracture: Secondary | ICD-10-CM | POA: Diagnosis not present

## 2022-11-10 DIAGNOSIS — M19072 Primary osteoarthritis, left ankle and foot: Secondary | ICD-10-CM

## 2022-11-10 DIAGNOSIS — M19041 Primary osteoarthritis, right hand: Secondary | ICD-10-CM | POA: Diagnosis not present

## 2022-11-10 DIAGNOSIS — M898X5 Other specified disorders of bone, thigh: Secondary | ICD-10-CM

## 2022-11-10 DIAGNOSIS — M25551 Pain in right hip: Secondary | ICD-10-CM

## 2022-11-10 DIAGNOSIS — I10 Essential (primary) hypertension: Secondary | ICD-10-CM | POA: Diagnosis not present

## 2022-11-10 DIAGNOSIS — E034 Atrophy of thyroid (acquired): Secondary | ICD-10-CM

## 2022-11-10 DIAGNOSIS — M5136 Other intervertebral disc degeneration, lumbar region: Secondary | ICD-10-CM | POA: Diagnosis not present

## 2022-11-10 DIAGNOSIS — M19071 Primary osteoarthritis, right ankle and foot: Secondary | ICD-10-CM | POA: Insufficient documentation

## 2022-11-10 DIAGNOSIS — Z8719 Personal history of other diseases of the digestive system: Secondary | ICD-10-CM | POA: Diagnosis not present

## 2022-11-10 DIAGNOSIS — R2681 Unsteadiness on feet: Secondary | ICD-10-CM

## 2022-11-10 DIAGNOSIS — M5030 Other cervical disc degeneration, unspecified cervical region: Secondary | ICD-10-CM

## 2022-11-10 DIAGNOSIS — M51369 Other intervertebral disc degeneration, lumbar region without mention of lumbar back pain or lower extremity pain: Secondary | ICD-10-CM

## 2022-11-10 DIAGNOSIS — M19042 Primary osteoarthritis, left hand: Secondary | ICD-10-CM | POA: Insufficient documentation

## 2022-11-10 DIAGNOSIS — G8929 Other chronic pain: Secondary | ICD-10-CM

## 2022-11-13 LAB — VITAMIN B1: Vitamin B1 (Thiamine): 14 nmol/L (ref 8–30)

## 2022-11-13 NOTE — Progress Notes (Signed)
B1 is normal, thanks

## 2022-11-16 ENCOUNTER — Ambulatory Visit (INDEPENDENT_AMBULATORY_CARE_PROVIDER_SITE_OTHER): Payer: Medicare Other

## 2022-11-16 ENCOUNTER — Encounter: Payer: Self-pay | Admitting: Internal Medicine

## 2022-11-16 ENCOUNTER — Ambulatory Visit (INDEPENDENT_AMBULATORY_CARE_PROVIDER_SITE_OTHER): Payer: Medicare Other | Admitting: Internal Medicine

## 2022-11-16 VITALS — BP 118/70 | HR 76 | Temp 97.8°F | Ht 59.0 in | Wt 114.0 lb

## 2022-11-16 DIAGNOSIS — R159 Full incontinence of feces: Secondary | ICD-10-CM | POA: Insufficient documentation

## 2022-11-16 DIAGNOSIS — R059 Cough, unspecified: Secondary | ICD-10-CM | POA: Diagnosis not present

## 2022-11-16 DIAGNOSIS — R051 Acute cough: Secondary | ICD-10-CM | POA: Diagnosis not present

## 2022-11-16 DIAGNOSIS — J069 Acute upper respiratory infection, unspecified: Secondary | ICD-10-CM

## 2022-11-16 DIAGNOSIS — R5382 Chronic fatigue, unspecified: Secondary | ICD-10-CM

## 2022-11-16 DIAGNOSIS — R151 Fecal smearing: Secondary | ICD-10-CM

## 2022-11-16 DIAGNOSIS — M461 Sacroiliitis, not elsewhere classified: Secondary | ICD-10-CM | POA: Insufficient documentation

## 2022-11-16 DIAGNOSIS — F4321 Adjustment disorder with depressed mood: Secondary | ICD-10-CM

## 2022-11-16 DIAGNOSIS — R3915 Urgency of urination: Secondary | ICD-10-CM | POA: Diagnosis not present

## 2022-11-16 DIAGNOSIS — R195 Other fecal abnormalities: Secondary | ICD-10-CM | POA: Diagnosis not present

## 2022-11-16 HISTORY — DX: Sacroiliitis, not elsewhere classified: M46.1

## 2022-11-16 HISTORY — DX: Full incontinence of feces: R15.9

## 2022-11-16 MED ORDER — CEFUROXIME AXETIL 250 MG PO TABS: 250.0000 mg | ORAL_TABLET | Freq: Two times a day (BID) | ORAL | 0 refills | Status: DC

## 2022-11-16 NOTE — Assessment & Plan Note (Addendum)
Worse.  Stool leakage - new D/c Aricept Abd X ray to rule out constipation

## 2022-11-16 NOTE — Patient Instructions (Signed)
  Stop Aricept

## 2022-11-16 NOTE — Progress Notes (Signed)
Subjective:  Patient ID: [name], female    DOB: 12-01-1935  Age: 86 y.o. MRN: [id number]  CC: Acute Visit (Have a cough , having back pain )   HPI AYMAR WHITFILL presents for frequent urination, incontinence of urine, stool in the at the anal area when urinating and wiping, stool leakage and smearing.  The problems have started several months ago, worse lately. C/o urinating at night 5 times, daytime ok Sydny started donepezil fairly recently.  Outpatient Medications Prior to Visit  Medication Sig Dispense Refill   acetaminophen (TYLENOL) 500 MG tablet Take 1,000 mg by mouth every 6 (six) hours as needed for mild pain.     albuterol (VENTOLIN HFA) 108 (90 Base) MCG/ACT inhaler Take 2 puffs every 4-6 hours as needed     Azelastine HCl 0.15 % SOLN Place into both nostrils. Place into both nostrils.     desonide (DESOWEN) 0.05 % cream Apply 1 application topically as needed.     diclofenac Sodium (VOLTAREN) 1 % GEL APPLY 4 GRAMS 4 TIMES DAILY. 200 g 3   EPINEPHrine (EPIPEN 2-PAK) 0.3 mg/0.3 mL IJ SOAJ injection Inject 0.3 mLs (0.3 mg total) into the muscle as needed. 1 Device 1   famotidine (PEPCID) 40 MG tablet Take 1 tablet (40 mg total) by mouth at bedtime. NEEDS OFFICE VISIT FOR ADDITIONAL REFILLS 30 tablet 2   ipratropium (ATROVENT) 0.06 % nasal spray Place 1 spray into the nose 3 (three) times daily as needed for rhinitis. 15 mL 0   levocetirizine (XYZAL) 5 MG tablet Take 5 mg by mouth every evening.     levothyroxine (SYNTHROID) 50 MCG tablet TAKE ONE TABLET BY MOUTH DAILY BEFORE BREAKFAST 30 tablet 5   LORazepam (ATIVAN) 1 MG tablet Take 2-3 tablets at bedtime and 1-2 tablets in the daytime as needed. 150 tablet 2   MEDROL 4 MG TBPK tablet Take by mouth.     montelukast (SINGULAIR) 10 MG tablet Take 1 tablet (10 mg total) by mouth at bedtime. 90 tablet 1   pantoprazole (PROTONIX) 40 MG tablet TAKE (1) TABLET TWICE A DAY BEFORE MEALS. (Patient taking differently: as needed.) 60  tablet 5   polyethylene glycol powder (GLYCOLAX/MIRALAX) 17 GM/SCOOP powder Take 17 g by mouth 2 (two) times daily as needed for moderate constipation. 500 g 3   pregabalin (LYRICA) 25 MG capsule take 1 capsule by oral route 2 times every day; start 1 qhsX5 days, then BID     simvastatin (ZOCOR) 40 MG tablet TAKE ONE TABLET ONCE DAILY 90 tablet 3   triamcinolone (NASACORT) 55 MCG/ACT AERO nasal inhaler Place 2 sprays into the nose as needed.     VESICARE 10 MG tablet Take 1 tablet (10 mg total) by mouth daily as needed (bladder). 90 tablet 3   Calcium Carbonate-Vitamin D (CALCIUM-VITAMIN D) 500-200 MG-UNIT per tablet Take 3 tablets by mouth daily. (Patient not taking: Reported on 11/10/2022)     donepezil (ARICEPT) 10 MG tablet Start Donepezil 10 mg :Take half tablet (5 mg) daily for 2 weeks, then increase to the full tablet at 10 mg daily. Side effects discussed (Patient not taking: Reported on 11/10/2022) 30 tablet 11   No facility-administered medications prior to visit.    ROS: Review of Systems  Constitutional:  Positive for fatigue. Negative for activity change, appetite change, chills and unexpected weight change.  HENT:  Negative for congestion, mouth sores and sinus pressure.   Eyes:  Negative for visual disturbance.  Respiratory:  Positive for cough. Negative for chest tightness.   Gastrointestinal:  Negative for abdominal pain, blood in stool, constipation, diarrhea, nausea and rectal pain.  Genitourinary:  Positive for frequency and urgency. Negative for difficulty urinating and vaginal pain.  Musculoskeletal:  Negative for back pain and gait problem.  Skin:  Negative for pallor and rash.  Neurological:  Negative for dizziness, tremors, weakness, numbness and headaches.  Psychiatric/Behavioral:  Positive for confusion, decreased concentration and dysphoric mood. Negative for sleep disturbance and suicidal ideas. The patient is nervous/anxious.     Objective:  BP 118/70 (BP  Location: Left Arm, Patient Position: Sitting, Cuff Size: Normal)   Pulse 76   Temp 97.8 F (36.6 C) (Oral)   Ht '4\' 11"'$  (1.499 m)   Wt 114 lb (51.7 kg)   SpO2 98%   BMI 23.03 kg/m   BP Readings from Last 3 Encounters:  11/16/22 118/70  11/10/22 121/71  11/08/22 127/71    Wt Readings from Last 3 Encounters:  11/16/22 114 lb (51.7 kg)  11/10/22 115 lb (52.2 kg)  11/08/22 115 lb (52.2 kg)    Physical Exam Constitutional:      General: She is not in acute distress.    Appearance: Normal appearance. She is well-developed.  HENT:     Head: Normocephalic.     Right Ear: External ear normal.     Left Ear: External ear normal.     Nose: Nose normal.  Eyes:     General:        Right eye: No discharge.        Left eye: No discharge.     Conjunctiva/sclera: Conjunctivae normal.     Pupils: Pupils are equal, round, and reactive to light.  Neck:     Thyroid: No thyromegaly.     Vascular: No JVD.     Trachea: No tracheal deviation.  Cardiovascular:     Rate and Rhythm: Normal rate and regular rhythm.     Heart sounds: Normal heart sounds.  Pulmonary:     Effort: No respiratory distress.     Breath sounds: No stridor. No wheezing.  Abdominal:     General: Bowel sounds are normal. There is no distension.     Palpations: Abdomen is soft. There is no mass.     Tenderness: There is no abdominal tenderness. There is no guarding or rebound.  Musculoskeletal:        General: No tenderness.     Cervical back: Normal range of motion and neck supple. No rigidity.  Lymphadenopathy:     Cervical: No cervical adenopathy.  Skin:    Findings: No erythema or rash.  Neurological:     Cranial Nerves: No cranial nerve deficit.     Motor: No abnormal muscle tone.     Coordination: Coordination normal.     Deep Tendon Reflexes: Reflexes normal.  Psychiatric:        Behavior: Behavior normal.        Thought Content: Thought content normal.        Judgment: Judgment normal.   Allergic,  cooperative, anxious, pleasant, neat Repeats questions  Lab Results  Component Value Date   WBC 10.1 11/16/2022   HGB 13.3 11/16/2022   HCT 37.8 11/16/2022   PLT 284.0 11/16/2022   GLUCOSE 88 11/16/2022   CHOL 186 02/17/2022   TRIG 141.0 02/17/2022   HDL 59.30 02/17/2022   LDLDIRECT 98.0 08/13/2021   LDLCALC 99 02/17/2022   ALT 28 11/16/2022  AST 22 11/16/2022   NA 127 (L) 11/16/2022   K 4.2 11/16/2022   CL 89 (L) 11/16/2022   CREATININE 0.70 11/16/2022   BUN 20 11/16/2022   CO2 28 11/16/2022   TSH 2.76 11/16/2022   HGBA1C 5.5 06/13/2020    DG INJECT DIAG/THERA/INC NEEDLE/CATH/PLC EPI/LUMB/SAC W/IMG  Result Date: 08/12/2022 CLINICAL DATA:  Lumbosacral spondylosis without myelopathy. Low back and right leg pain. Minimal improvement after L5-S1 epidural injection 2 weeks ago. Repeat injection requested. FLUOROSCOPY: Radiation Exposure Index (as provided by the fluoroscopic device): 3.4 mGy Kerma PROCEDURE: The procedure, risks, benefits, and alternatives were explained to the patient. Questions regarding the procedure were encouraged and answered. The patient understands and consents to the procedure. LUMBAR EPIDURAL INJECTION: An interlaminar approach was performed on the right at L5-S1. The overlying skin was cleansed and anesthetized. A 3.5 inch 20 gauge epidural needle was advanced using loss-of-resistance technique. DIAGNOSTIC EPIDURAL INJECTION: Injection of Isovue-M 200 shows a good epidural pattern with spread above and below the level of needle placement to both sides. No vascular opacification is seen. THERAPEUTIC EPIDURAL INJECTION: 80 mg of Depo-Medrol mixed with 3 mL of 1% lidocaine were instilled. The procedure was well-tolerated, and the patient was discharged thirty minutes following the injection in good condition. COMPLICATIONS: None immediate. IMPRESSION: Technically successful interlaminar epidural injection on the right at L5-S1. Electronically Signed   By: Titus Dubin M.D.   On: 08/12/2022 13:34    Assessment & Plan:   Problem List Items Addressed This Visit     Urinary urgency - Primary    Worse.  Obtain urinalysis.  D/c Aricept      Relevant Orders   DG Abd 2 Views   CBC with Differential/Platelet (Completed)   Comprehensive metabolic panel (Completed)   T4, free (Completed)   TSH (Completed)   Urinalysis (Completed)   Upper respiratory infection    New Start a Zpac Ordered CXR      Relevant Medications   cefUROXime (CEFTIN) 250 MG tablet   Other Relevant Orders   DG Chest 2 View   Stool incontinence    Worse.  Stool leakage - new D/c Aricept Abd X ray to rule out constipation      Relevant Orders   DG Abd 2 Views   CBC with Differential/Platelet (Completed)   Comprehensive metabolic panel (Completed)   T4, free (Completed)   TSH (Completed)   Urinalysis (Completed)   Grief    Discussed.  It was a hard year for Roland.  Coping as well as she can.      Fatigue   Relevant Orders   CBC with Differential/Platelet (Completed)   Comprehensive metabolic panel (Completed)   T4, free (Completed)   TSH (Completed)   Urinalysis (Completed)   Cough    Probable bronchitis.  Prescribed cefuroxime.  Obtain chest x-ray         Meds ordered this encounter  Medications   cefUROXime (CEFTIN) 250 MG tablet    Sig: Take 1 tablet (250 mg total) by mouth 2 (two) times daily with a meal.    Dispense:  14 tablet    Refill:  0      Follow-up: Return in about 4 weeks (around 12/14/2022) for a follow-up visit.  Walker Kehr, MD

## 2022-11-16 NOTE — Assessment & Plan Note (Addendum)
Worse.  Obtain urinalysis.  D/c Aricept

## 2022-11-16 NOTE — Assessment & Plan Note (Addendum)
New Start a Zpac Ordered CXR

## 2022-11-17 LAB — CBC WITH DIFFERENTIAL/PLATELET
Basophils Absolute: 0.1 10*3/uL (ref 0.0–0.1)
Basophils Relative: 0.5 % (ref 0.0–3.0)
Eosinophils Absolute: 0.1 10*3/uL (ref 0.0–0.7)
Eosinophils Relative: 1.1 % (ref 0.0–5.0)
HCT: 37.8 % (ref 36.0–46.0)
Hemoglobin: 13.3 g/dL (ref 12.0–15.0)
Lymphocytes Relative: 11.8 % — ABNORMAL LOW (ref 12.0–46.0)
Lymphs Abs: 1.2 10*3/uL (ref 0.7–4.0)
MCHC: 35.1 g/dL (ref 30.0–36.0)
MCV: 96.4 fl (ref 78.0–100.0)
Monocytes Absolute: 1.3 10*3/uL — ABNORMAL HIGH (ref 0.1–1.0)
Monocytes Relative: 12.7 % — ABNORMAL HIGH (ref 3.0–12.0)
Neutro Abs: 7.5 10*3/uL (ref 1.4–7.7)
Neutrophils Relative %: 73.9 % (ref 43.0–77.0)
Platelets: 284 10*3/uL (ref 150.0–400.0)
RBC: 3.92 Mil/uL (ref 3.87–5.11)
RDW: 12.7 % (ref 11.5–15.5)
WBC: 10.1 10*3/uL (ref 4.0–10.5)

## 2022-11-17 LAB — COMPREHENSIVE METABOLIC PANEL
ALT: 28 U/L (ref 0–35)
AST: 22 U/L (ref 0–37)
Albumin: 4.2 g/dL (ref 3.5–5.2)
Alkaline Phosphatase: 40 U/L (ref 39–117)
BUN: 20 mg/dL (ref 6–23)
CO2: 28 mEq/L (ref 19–32)
Calcium: 9.1 mg/dL (ref 8.4–10.5)
Chloride: 89 mEq/L — ABNORMAL LOW (ref 96–112)
Creatinine, Ser: 0.7 mg/dL (ref 0.40–1.20)
GFR: 79.44 mL/min (ref >60.00)
Glucose, Bld: 88 mg/dL (ref 70–99)
Potassium: 4.2 mEq/L (ref 3.5–5.1)
Sodium: 127 mEq/L — ABNORMAL LOW (ref 135–145)
Total Bilirubin: 0.5 mg/dL (ref 0.2–1.2)
Total Protein: 7.3 g/dL (ref 6.0–8.3)

## 2022-11-17 LAB — URINALYSIS
Bilirubin Urine: NEGATIVE
Hgb urine dipstick: NEGATIVE
Ketones, ur: NEGATIVE
Leukocytes,Ua: NEGATIVE
Nitrite: NEGATIVE
Specific Gravity, Urine: 1.02 (ref 1.000–1.030)
Total Protein, Urine: NEGATIVE
Urine Glucose: NEGATIVE
Urobilinogen, UA: 0.2 (ref 0.0–1.0)
pH: 6 (ref 5.0–8.0)

## 2022-11-17 LAB — T4, FREE: Free T4: 1.31 ng/dL (ref 0.60–1.60)

## 2022-11-17 LAB — TSH: TSH: 2.76 u[IU]/mL (ref 0.35–5.50)

## 2022-11-18 ENCOUNTER — Telehealth: Payer: Self-pay | Admitting: Internal Medicine

## 2022-11-18 NOTE — Telephone Encounter (Signed)
Patient called because she got a message that her lab results were ready but she was having trouble getting in her mychart. I read her the message below:  Dear [name], Your labs/tests are okay overall.  Your sodium level is slightly decreased.  Low sodium can be caused by consuming high volumes of water.  If you have been drinking a lot of water, please cut back on drinking water -this should help to bring your sodium up to normal levels.  Your blood count is normal.  Your thyroid test is normal.  Your urine test is normal.  I do not have the results of your abdominal x-ray and your chest x-ray yet. Sincerely, AP   Disclaimer: This note was dictated with voice recognition software. Similar sounding words can inadvertently be transcribed and may not be corrected upon review.   She said she will be on the lookout for her xray results. I also gave her the mychart help desk number to help get logged into mychart. She didn't have any questions either

## 2022-11-18 NOTE — Assessment & Plan Note (Signed)
Discussed.  It was a hard year for [place].  Coping as well as she can.

## 2022-11-18 NOTE — Telephone Encounter (Signed)
Prolia insurance verification has been sent awaiting Summary of benefits  

## 2022-11-18 NOTE — Assessment & Plan Note (Signed)
Probable bronchitis.  Prescribed cefuroxime.  Obtain chest x-ray

## 2022-11-25 ENCOUNTER — Encounter: Payer: Self-pay | Admitting: Internal Medicine

## 2022-11-26 ENCOUNTER — Other Ambulatory Visit: Payer: Self-pay | Admitting: Internal Medicine

## 2022-11-30 ENCOUNTER — Ambulatory Visit (INDEPENDENT_AMBULATORY_CARE_PROVIDER_SITE_OTHER): Payer: Medicare Other | Admitting: Sports Medicine

## 2022-11-30 VITALS — BP 120/72 | Ht 60.0 in | Wt 116.0 lb

## 2022-11-30 DIAGNOSIS — G8929 Other chronic pain: Secondary | ICD-10-CM | POA: Diagnosis not present

## 2022-11-30 DIAGNOSIS — M533 Sacrococcygeal disorders, not elsewhere classified: Secondary | ICD-10-CM | POA: Diagnosis not present

## 2022-11-30 HISTORY — DX: Other chronic pain: G89.29

## 2022-11-30 MED ORDER — METHYLPREDNISOLONE ACETATE 40 MG/ML IJ SUSP
40.0000 mg | Freq: Once | INTRAMUSCULAR | Status: AC
  Administered 2022-11-30: 40 mg via INTRA_ARTICULAR

## 2022-11-30 NOTE — Assessment & Plan Note (Signed)
3 month history of pain, improved with oral prednisone, pregabalin and Tylenol.  Performed SI joint injection today, detailed below.  The patient was also given hip abduction exercises and stretches to perform a couple times a day as tolerated.  We are hopeful that this comes on her pain and help to strengthen  her hip.  Follow-up in 4-6 weeks.

## 2022-11-30 NOTE — Progress Notes (Signed)
New Patient Office Visit  Subjective   Patient ID: [name], female    DOB: 12/29/1935  Age: 87 y.o. MRN: [id number]  Left hip pain.  She presents today with chief complaint of left-sided low back/hip pain.  This has been bothering her for approximately 3 months.  Of note she does see Dr. Gwenette Greet neurosurgeon who provides epidural steroid injections for her.  For her hip and back pain he prescribed pregabalin in the a.m. and Tylenol possible tearing.  This does give her some temporary relief.  He also prescribed her oral prednisone taper that recently really helped subside her pain however he was unable to refill this medicine until a follow-up appointment which she has scheduled in February.  She reports that her pain is tolerable while sitting however is worse with standing and walking.  She was diagnosed with sacroiliitis and is here today at the recommendation of some of her friends who are current patient of Dr. Oneida Alar.   ROS as listed above in HPI    Objective:     BP 120/72   Ht 5' (1.524 m)   Wt 116 lb (52.6 kg)   BMI 22.65 kg/m   Physical Exam Vitals reviewed.  Constitutional:      General: She is not in acute distress.    Appearance: Normal appearance. She is not ill-appearing, toxic-appearing or diaphoretic.  Pulmonary:     Effort: Pulmonary effort is normal.  Skin:    General: Skin is warm.  Neurological:     Mental Status: She is alert.  Psychiatric:        Mood and Affect: Mood normal.        Behavior: Behavior normal.        Thought Content: Thought content normal.        Judgment: Judgment normal.   Left hip: No obvious deformity or asymmetry.  No ecchymosis or edema.  She has tenderness to palpation along her SI joint and her piriformis/gluteus muscles.  Full range of motion with hip flexion.  Some slight decreased internal and external rotation however this is painful for her. She has weakness with resisted hip abduction.negative anterior compression  test.  She has symmetric movement of the SI joint right to left.    Assessment & Plan:   Problem List Items Addressed This Visit       Other   Chronic left SI joint pain - Primary    3 month history of pain, improved with oral prednisone, pregabalin and Tylenol.  Performed SI joint injection today, detailed below.  The patient was also given hip abduction exercises and stretches to perform a couple times a day as tolerated.  We are hopeful that this comes on her pain and help to strengthen  her hip.  Follow-up in 4-6 weeks.      Procedure:After obtaining verbal consent patient's skin was cleansed with alcohol.   Topical Ethyl Chlolride for anesthesia. A 1-4 mixture of betamethasone 40 mg per mL and 1% lidocaine plain was injected into the left SI joint using bony landmarks. A 22-gauge 1-1/2 inch spinal needles was used.  The patient tolerated the procedure no complications.  Prior to the procedure risks , benefits and treatment alternatives were discussed.   Return in about 6 weeks (around 01/11/2023).    Elmore Guise, DO  I observed and examined the patient with the [facility] resident and agree with assessment and plan.  Note reviewed and modified by me. Ila Mcgill, MD

## 2022-12-01 ENCOUNTER — Telehealth: Payer: Self-pay | Admitting: Internal Medicine

## 2022-12-01 ENCOUNTER — Telehealth: Payer: Self-pay

## 2022-12-01 ENCOUNTER — Telehealth: Payer: Self-pay | Admitting: *Deleted

## 2022-12-01 NOTE — Telephone Encounter (Signed)
Patient called because she was concerned that BD test result she received showed her age as 66.  I called her back and explained that their is a > greater than or = symbol before 65 and that is checked for anyone over 65.  She said she did not notice the > when she looked at it. Patient voiced understanding.

## 2022-12-01 NOTE — Telephone Encounter (Signed)
Patient called wanting to know why Dr. Camila Li took her off her medication donepezil (ARICEPT) 10 MG tablet. Patient stated that in her Aftercare visit summary from 11/16/2022 she saw that he told her to stop taking that medication but she isn't sure why. Patient would like a call back at [phone].

## 2022-12-01 NOTE — Telephone Encounter (Signed)
Please ask Tabria to look at my reply to her MyChart message with explanation. Thank you

## 2022-12-01 NOTE — Telephone Encounter (Signed)
Prolia insurance verification has been sent awaiting Summary of benefits  

## 2022-12-02 NOTE — Telephone Encounter (Addendum)
Deductible $240 ($0 met)  OOP MAX n/a  Annual exam 09/29/2022  Calcium    9.1         Date 11/16/2022  Upcoming dental procedures NO  Is Prior Authorization needed n/a  Pt estimated Cost $240  Appt 12/08/2022     Coverage Details:0% one dose, 0% admin fee

## 2022-12-02 NOTE — Telephone Encounter (Signed)
Pt is aware to check mychart.[name] KitchenJohny [name]

## 2022-12-04 ENCOUNTER — Ambulatory Visit
Admission: RE | Admit: 2022-12-04 | Discharge: 2022-12-04 | Disposition: A | Discharge status: Home / Self Care | Payer: Medicare Other | Source: Ambulatory Visit | Attending: Physician Assistant | Admitting: Physician Assistant

## 2022-12-04 DIAGNOSIS — G319 Degenerative disease of nervous system, unspecified: Secondary | ICD-10-CM | POA: Diagnosis not present

## 2022-12-04 DIAGNOSIS — G939 Disorder of brain, unspecified: Secondary | ICD-10-CM | POA: Diagnosis not present

## 2022-12-04 DIAGNOSIS — I6782 Cerebral ischemia: Secondary | ICD-10-CM | POA: Diagnosis not present

## 2022-12-04 DIAGNOSIS — R413 Other amnesia: Secondary | ICD-10-CM | POA: Diagnosis not present

## 2022-12-07 ENCOUNTER — Other Ambulatory Visit: Payer: Self-pay | Admitting: Internal Medicine

## 2022-12-07 DIAGNOSIS — L723 Sebaceous cyst: Secondary | ICD-10-CM | POA: Diagnosis not present

## 2022-12-07 DIAGNOSIS — L819 Disorder of pigmentation, unspecified: Secondary | ICD-10-CM | POA: Diagnosis not present

## 2022-12-07 DIAGNOSIS — H10413 Chronic giant papillary conjunctivitis, bilateral: Secondary | ICD-10-CM | POA: Diagnosis not present

## 2022-12-07 DIAGNOSIS — L230 Allergic contact dermatitis due to metals: Secondary | ICD-10-CM | POA: Diagnosis not present

## 2022-12-07 DIAGNOSIS — Z961 Presence of intraocular lens: Secondary | ICD-10-CM | POA: Diagnosis not present

## 2022-12-07 DIAGNOSIS — L570 Actinic keratosis: Secondary | ICD-10-CM | POA: Diagnosis not present

## 2022-12-08 ENCOUNTER — Emergency Department (HOSPITAL_BASED_OUTPATIENT_CLINIC_OR_DEPARTMENT_OTHER)
Admission: EM | Admit: 2022-12-08 | Discharge: 2022-12-08 | Disposition: A | Discharge status: Home / Self Care | Payer: Medicare Other | Attending: Emergency Medicine | Admitting: Emergency Medicine

## 2022-12-08 ENCOUNTER — Telehealth (HOSPITAL_COMMUNITY): Payer: Self-pay | Admitting: *Deleted

## 2022-12-08 ENCOUNTER — Encounter (HOSPITAL_BASED_OUTPATIENT_CLINIC_OR_DEPARTMENT_OTHER): Payer: Self-pay

## 2022-12-08 ENCOUNTER — Other Ambulatory Visit: Payer: Self-pay

## 2022-12-08 ENCOUNTER — Emergency Department (HOSPITAL_BASED_OUTPATIENT_CLINIC_OR_DEPARTMENT_OTHER): Payer: Medicare Other

## 2022-12-08 ENCOUNTER — Ambulatory Visit: Payer: Medicare Other

## 2022-12-08 ENCOUNTER — Telehealth: Payer: Self-pay | Admitting: Internal Medicine

## 2022-12-08 DIAGNOSIS — R519 Headache, unspecified: Secondary | ICD-10-CM | POA: Diagnosis not present

## 2022-12-08 DIAGNOSIS — I10 Essential (primary) hypertension: Secondary | ICD-10-CM | POA: Diagnosis not present

## 2022-12-08 DIAGNOSIS — J45909 Unspecified asthma, uncomplicated: Secondary | ICD-10-CM | POA: Diagnosis not present

## 2022-12-08 DIAGNOSIS — R42 Dizziness and giddiness: Secondary | ICD-10-CM | POA: Diagnosis not present

## 2022-12-08 DIAGNOSIS — E039 Hypothyroidism, unspecified: Secondary | ICD-10-CM | POA: Diagnosis not present

## 2022-12-08 DIAGNOSIS — R030 Elevated blood-pressure reading, without diagnosis of hypertension: Secondary | ICD-10-CM | POA: Diagnosis not present

## 2022-12-08 DIAGNOSIS — I509 Heart failure, unspecified: Secondary | ICD-10-CM | POA: Insufficient documentation

## 2022-12-08 LAB — CBC
HCT: 34.4 % — ABNORMAL LOW (ref 36.0–46.0)
Hemoglobin: 11.6 g/dL — ABNORMAL LOW (ref 12.0–15.0)
MCH: 32.7 pg (ref 26.0–34.0)
MCHC: 33.7 g/dL (ref 30.0–36.0)
MCV: 96.9 fL (ref 80.0–100.0)
Platelets: 264 10*3/uL (ref 150–400)
RBC: 3.55 MIL/uL — ABNORMAL LOW (ref 3.87–5.11)
RDW: 12.2 % (ref 11.5–15.5)
WBC: 6.9 10*3/uL (ref 4.0–10.5)
nRBC: 0 % (ref 0.0–0.2)

## 2022-12-08 LAB — BASIC METABOLIC PANEL
Anion gap: 9 (ref 5–15)
BUN: 17 mg/dL (ref 8–23)
CO2: 24 mmol/L (ref 22–32)
Calcium: 9.3 mg/dL (ref 8.9–10.3)
Chloride: 98 mmol/L (ref 98–111)
Creatinine, Ser: 0.57 mg/dL (ref 0.44–1.00)
GFR, Estimated: >60 mL/min (ref >60)
Glucose, Bld: 102 mg/dL — ABNORMAL HIGH (ref 70–99)
Potassium: 4 mmol/L (ref 3.5–5.1)
Sodium: 131 mmol/L — ABNORMAL LOW (ref 135–145)

## 2022-12-08 LAB — URINALYSIS, ROUTINE W REFLEX MICROSCOPIC
Bilirubin Urine: NEGATIVE
Glucose, UA: NEGATIVE mg/dL
Hgb urine dipstick: NEGATIVE
Ketones, ur: NEGATIVE mg/dL
Leukocytes,Ua: NEGATIVE
Nitrite: NEGATIVE
Protein, ur: NEGATIVE mg/dL
Specific Gravity, Urine: 1.012 (ref 1.005–1.030)
pH: 6.5 (ref 5.0–8.0)

## 2022-12-08 MED ORDER — AMLODIPINE BESYLATE 2.5 MG PO TABS: 2.5000 mg | ORAL_TABLET | Freq: Every day | ORAL | 0 refills | Status: DC | PRN

## 2022-12-08 NOTE — Discharge Instructions (Signed)
You were seen in the emergency department for headache and lightheadedness.  As we discussed your labs and imaging looked reassuring today.  It is possible that your symptoms could have been related to restarting Aricept, so I would discontinue it for now.  While your blood pressure readings have improved, they are still elevated.  I am placing you on an as needed medication. You can take 1 tablet of Amlodipine if your blood pressure reads above 170/100.   You can continue taking tylenol as needed for headache. It's important to follow up with your primary doctor about your blood pressure readings.   Continue to monitor how you're doing and return to the ER for new or worsening symptoms.

## 2022-12-08 NOTE — Telephone Encounter (Signed)
Pt left vm stating her bp was 190/80 and she was very light headed. Pt said this has never happened before. Pt asked to see Dr.Bensimhon or get a call back from [facility].

## 2022-12-08 NOTE — Telephone Encounter (Signed)
Patient's blood pressure is 170/80 - she is not feeling well.  She wants to come in today and I told her that we do not have any appointments

## 2022-12-08 NOTE — ED Provider Notes (Signed)
[facility] EMERGENCY DEPT Provider Note   CSN: [id number] Arrival date & time: 12/08/22  1540     History  Chief Complaint  Patient presents with   Dizziness    [name] is a 87 y.o. female with history of GERD, osteoporosis, hyperlipidemia, mitral valve prolapse, aortic stenosis, hypothyroidism, asthma, CHF (EF 53 to 70%) who presents to the emergency department for lightheadedness and headache.  Patient coming from [facility] assisted [facility].  Patient had her blood pressure assessed and over 3 readings it was consistently with systolics in the 824M. No prior history of HTN. She took some Tylenol, which she takes every 6 hours for hip pain, and says that this improved her headache.  She still has a mild lingering headache and a little bit of lightheadedness.  She never felt like she was going lose consciousness.  Experiencing a little bit of indigestion right now, says that she ate some chocolate earlier which sometimes brings on indigestion.  Patient states that she took 5 mg Aricept earlier today, which she has not taken in "a long time".  She is unsure if this could have contributed to her dizziness.   Dizziness Associated symptoms: headaches        Home Medications Prior to Admission medications   Medication Sig Start Date End Date Taking? Authorizing Provider  amLODipine (NORVASC) 2.5 MG tablet Take 1 tablet (2.5 mg total) by mouth daily as needed. Take if BP is above 170/100 12/08/22  Yes Marcel Sorter T, PA-C  acetaminophen (TYLENOL) 500 MG tablet Take 1,000 mg by mouth every 6 (six) hours as needed for mild pain.    [provider]  albuterol (VENTOLIN HFA) 108 (90 Base) MCG/ACT inhaler Take 2 puffs every 4-6 hours as needed 10/26/21   [provider]  Azelastine HCl 0.15 % SOLN Place into both nostrils. Place into both nostrils. 10/26/21   [provider]  Calcium Carbonate-Vitamin D (CALCIUM-VITAMIN D) 500-200 MG-UNIT per  tablet Take 3 tablets by mouth daily. Patient not taking: Reported on 11/10/2022    [provider]  cefUROXime (CEFTIN) 250 MG tablet TAKE ONE TABLET BY MOUTH TWICE DAILY WITH A MEAL 11/26/22   Plotnikov, Evie Lacks, MD  desonide (DESOWEN) 0.05 % cream Apply 1 application topically as needed.    [provider]  diclofenac Sodium (VOLTAREN) 1 % GEL APPLY 4 GRAMS 4 TIMES DAILY. 05/18/21   Plotnikov, Evie Lacks, MD  donepezil (ARICEPT) 10 MG tablet Start Donepezil 10 mg :Take half tablet (5 mg) daily for 2 weeks, then increase to the full tablet at 10 mg daily. Side effects discussed Patient not taking: Reported on 11/10/2022 11/08/22   Rondel Jumbo, PA-C  EPINEPHrine (EPIPEN 2-PAK) 0.3 mg/0.3 mL IJ SOAJ injection Inject 0.3 mLs (0.3 mg total) into the muscle as needed. 12/20/18   Plotnikov, Evie Lacks, MD  famotidine (PEPCID) 40 MG tablet Take 1 tablet (40 mg total) by mouth at bedtime. NEEDS OFFICE VISIT FOR ADDITIONAL REFILLS 08/03/21   Zehr, Janett Billow D, PA-C  ipratropium (ATROVENT) 0.06 % nasal spray Place 1 spray into the nose 3 (three) times daily as needed for rhinitis. 06/27/14   Plotnikov, Evie Lacks, MD  levocetirizine (XYZAL) 5 MG tablet Take 5 mg by mouth every evening.    [provider]  levothyroxine (SYNTHROID) 50 MCG tablet TAKE ONE TABLET BY MOUTH DAILY BEFORE BREAKFAST 10/11/22   Plotnikov, Evie Lacks, MD  LORazepam (ATIVAN) 1 MG tablet Take 2-3 tablets at bedtime  and 1-2 tablets in the daytime as needed. 10/06/22   Plotnikov, Evie Lacks, MD  MEDROL 4 MG TBPK tablet Take by mouth. 11/08/22   [provider]  montelukast (SINGULAIR) 10 MG tablet Take 1 tablet (10 mg total) by mouth at bedtime. 03/30/21   Plotnikov, Evie Lacks, MD  pantoprazole (PROTONIX) 40 MG tablet TAKE (1) TABLET TWICE A DAY BEFORE MEALS. Patient taking differently: as needed. 12/14/21   Plotnikov, Evie Lacks, MD  polyethylene glycol powder (GLYCOLAX/MIRALAX) 17 GM/SCOOP powder Take 17 g by  mouth 2 (two) times daily as needed for moderate constipation. 03/29/22   Plotnikov, Evie Lacks, MD  pregabalin (LYRICA) 25 MG capsule take 1 capsule by oral route 2 times every day; start 1 qhsX5 days, then BID 09/21/22   [provider]  simvastatin (ZOCOR) 40 MG tablet TAKE ONE TABLET ONCE DAILY 09/07/22   Plotnikov, Evie Lacks, MD  triamcinolone (NASACORT) 55 MCG/ACT AERO nasal inhaler Place 2 sprays into the nose as needed.    [provider]  VESICARE 10 MG tablet Take 1 tablet (10 mg total) by mouth daily as needed (bladder). 03/29/22   Plotnikov, Evie Lacks, MD      Allergies    Bee venom, Bacitracin-polymyxin b, Clarithromycin, Doxycycline, Neosporin [neomycin-bacitracin zn-polymyx], and Hydrocortisone    Review of Systems   Review of Systems  Neurological:  Positive for dizziness and headaches.  All other systems reviewed and are negative.   Physical Exam Updated Vital Signs BP (!) 158/68   Pulse 74   Temp 98 F (36.7 C) (Oral)   Resp 19   Ht 5' (1.524 m)   Wt 52.6 kg   SpO2 96%   BMI 22.65 kg/m  Physical Exam Vitals and nursing note reviewed.  Constitutional:      Appearance: Normal appearance.  HENT:     Head: Normocephalic and atraumatic.  Eyes:     Conjunctiva/sclera: Conjunctivae normal.  Cardiovascular:     Rate and Rhythm: Normal rate and regular rhythm.  Pulmonary:     Effort: Pulmonary effort is normal. No respiratory distress.     Breath sounds: Normal breath sounds.  Abdominal:     General: There is no distension.     Palpations: Abdomen is soft.     Tenderness: There is no abdominal tenderness.  Skin:    General: Skin is warm and dry.  Neurological:     General: No focal deficit present.     Mental Status: She is alert.     Comments: Neuro: Speech is clear, able to follow commands. CN III-XII intact grossly intact. PERRLA. EOMI. Sensation intact throughout. Str 5/5 all extremities.     ED Results / Procedures / Treatments    Labs (all labs ordered are listed, but only abnormal results are displayed) Labs Reviewed  BASIC METABOLIC PANEL - Abnormal; Notable for the following components:      Result Value   Sodium 131 (*)    Glucose, Bld 102 (*)    All other components within normal limits  CBC - Abnormal; Notable for the following components:   RBC 3.55 (*)    Hemoglobin 11.6 (*)    HCT 34.4 (*)    All other components within normal limits  URINALYSIS, ROUTINE W REFLEX MICROSCOPIC - Abnormal; Notable for the following components:   Color, Urine COLORLESS (*)    All other components within normal limits    EKG EKG Interpretation  Date/Time:  Wednesday December 08 2022 15:59:31 EST Ventricular  Rate:  75 PR Interval:  174 QRS Duration: 81 QT Interval:  400 QTC Calculation: 447 R Axis:   -18 Text Interpretation: Sinus rhythm Left ventricular hypertrophy No significant change since last tracing Confirmed by Dorie Rank [phone]) on 12/08/2022 4:03:55 PM  Radiology CT Head Wo Contrast  Result Date: 12/08/2022 CLINICAL DATA:  Headache EXAM: CT HEAD WITHOUT CONTRAST TECHNIQUE: Contiguous axial images were obtained from the base of the skull through the vertex without intravenous contrast. RADIATION DOSE REDUCTION: This exam was performed according to the departmental dose-optimization program which includes automated exposure control, adjustment of the mA and/or kV according to patient size and/or use of iterative reconstruction technique. COMPARISON:  MRI 12/04/2022, CT brain 07/08/2022 FINDINGS: Brain: No acute territorial infarction or intracranial hemorrhage is visualized. Left frontal convexity hyperdense mass measuring 16 mm corresponding to history of meningioma, mildly calcified. Some mass effect on underlying brain parenchyma but no edema. Mild chronic small vessel ischemic changes of the white matter. Stable ventricle size. Posterior fossa mildly obscured by artifact from hardware Vascular: No hyperdense  vessels.  Carotid vascular calcification Skull: No acute fracture Sinuses/Orbits: No acute finding. Other: Hardware in the upper cervical spine. Chronic fracture deformity anterior and posterior arch of C1 IMPRESSION: 1. No CT evidence for acute intracranial abnormality. 2. Mild chronic small vessel ischemic changes of the white matter. Stable left frontal convexity meningioma. Electronically Signed   By: Donavan Foil M.D.   On: 12/08/2022 16:19    Procedures Procedures    Medications Ordered in ED Medications - No data to display  ED Course/ Medical Decision Making/ A&P                           Medical Decision Making Amount and/or Complexity of Data Reviewed Labs: ordered. Radiology: ordered.  This patient is a 87 y.o. female  who presents to the ED for concern of headache, lightheadedness.   Differential diagnoses prior to evaluation: The emergent differential diagnosis includes, but is not limited to,  Intracranial hemorrhage, meningitis, CVA, intracranial tumor, venous sinus thrombosis, migraine, cluster headache, hypertension, drug related, pseudotumor cerebri, AVM, head injury, tension headache, sinusitis, dental abscess, otitis media, TMJ, depression, temporal arteritis, glaucoma, trigeminal neuralgia. This is not an exhaustive differential.   Past Medical History / Co-morbidities: GERD, osteoporosis, hyperlipidemia, mitral valve prolapse, aortic stenosis, hypothyroidism, asthma, CHF (EF 65 to 70%)  Additional history: Chart reviewed. Pertinent results include: Follows with [facility] for cardiology. Was previously put on Aricept by neurology after referral by recommendation of PCP with concern for some memory changes. This medication change was made on 12/11, PCP cancelled it on 12/19. Restarted on 1/5.  Physical Exam: Physical exam performed. The pertinent findings include: Hypertensive to 158/68, otherwise normal vital signs.  Only mild headache at this time.   Normal neurologic exam as above.  Lab Tests/Imaging studies: I personally interpreted labs/imaging and the pertinent results include: CBC with stable hemoglobin.  Sodium mildly decreased at 131, otherwise unremarkable.  Urinalysis negative for hematuria or infection.  CT head with no acute abnormalities. I agree with the radiologist interpretation.  Cardiac monitoring: EKG obtained and interpreted by my attending physician which shows: Sinus rhythm with LVH, no significant change compared to prior   Disposition: After consideration of the diagnostic results and the patients response to treatment, I feel that emergency department workup does not suggest an emergent condition requiring admission or immediate intervention beyond what has been performed  at this time. The plan is: Discharge to home with prescribed as needed blood pressure medication.  Discussed with the patient that her restarting donepezil could have impacted her symptoms today.  However, her blood pressure continues to be mildly elevated.  Will place her on 2.5 mg amlodipine to take if her blood pressure is over 170/100.  Nursing aide at bedside understands the plan, and will inform the staff to be checking the patient's blood pressure at least once daily.  Patient to follow-up with her PCP about ongoing blood pressure management.  Advised to stop donepezil at this time, in case it was contributing to her symptoms.  The patient is safe for discharge and has been instructed to return immediately for worsening symptoms, change in symptoms or any other concerns.  Final Clinical Impression(s) / ED Diagnoses Final diagnoses:  Elevated blood pressure reading  Acute nonintractable headache, unspecified headache type  Lightheadedness    Rx / DC Orders ED Discharge Orders          Ordered    amLODipine (NORVASC) 2.5 MG tablet  Daily PRN        12/08/22 1754           Portions of this report may have been transcribed using voice  recognition software. Every effort was made to ensure accuracy; however, inadvertent computerized transcription errors may be present.    Estill Cotta 12/08/22 1801    Dorie Rank, MD 12/10/22 1049

## 2022-12-08 NOTE — ED Triage Notes (Signed)
Patient here POV from [facility].  Endorses at 1300 she began to feel lightheaded and had a Mild headache. Had her BP assessed and it was 190/80 consistently 3 times.   No History of HTN.  NAD Noted during Triage. A&Ox4. GCS 15. Ambulatory.

## 2022-12-08 NOTE — Telephone Encounter (Signed)
No appts available will send msg to MD./lmb

## 2022-12-08 NOTE — ED Notes (Addendum)
Pt is reporting "feeling much better."  Pt was able to walk to the restroom w/ no issue.   Pt reported she took Aricept '5mg'$  which she hadn't taken "in a long time."  Pt unsure if this has caused her dizziness.

## 2022-12-08 NOTE — ED Notes (Signed)
Pt verbalized understanding of d/c instructions, meds, and followup care. Denies questions. VSS, no distress noted. Steady gait to exit with all belongings.  ?

## 2022-12-09 ENCOUNTER — Ambulatory Visit: Payer: Medicare Other

## 2022-12-12 NOTE — Telephone Encounter (Signed)
Okay same-day appointment this week.  Thank you

## 2022-12-13 NOTE — Telephone Encounter (Signed)
Appt has been made 12/15/22,.../lmb

## 2022-12-15 ENCOUNTER — Ambulatory Visit (INDEPENDENT_AMBULATORY_CARE_PROVIDER_SITE_OTHER): Payer: Medicare Other | Admitting: Internal Medicine

## 2022-12-15 ENCOUNTER — Encounter: Payer: Self-pay | Admitting: Internal Medicine

## 2022-12-15 VITALS — BP 128/72 | HR 82 | Temp 97.6°F | Ht 60.0 in | Wt 115.0 lb

## 2022-12-15 DIAGNOSIS — R269 Unspecified abnormalities of gait and mobility: Secondary | ICD-10-CM | POA: Diagnosis not present

## 2022-12-15 DIAGNOSIS — F419 Anxiety disorder, unspecified: Secondary | ICD-10-CM | POA: Diagnosis not present

## 2022-12-15 DIAGNOSIS — R5382 Chronic fatigue, unspecified: Secondary | ICD-10-CM | POA: Diagnosis not present

## 2022-12-15 DIAGNOSIS — R3989 Other symptoms and signs involving the genitourinary system: Secondary | ICD-10-CM | POA: Diagnosis not present

## 2022-12-15 DIAGNOSIS — I10 Essential (primary) hypertension: Secondary | ICD-10-CM | POA: Diagnosis not present

## 2022-12-15 NOTE — Assessment & Plan Note (Signed)
Balance class q 1 week

## 2022-12-15 NOTE — Progress Notes (Signed)
Subjective:  Patient ID: [name], female    DOB: July 06, 1936  Age: 87 y.o. MRN: [id number]  CC: Follow-up (4 week f/u. B/P concerns. 180/100 12/08/21 [facility] RN took reading.)   HPI [name] presents for HTN and s/p ER visit on 12/08/22 for SBP 180  Per Dr Tomi Bamberger: " Discussed with the patient that her restarting donepezil could have impacted her symptoms today.  However, her blood pressure continues to be mildly elevated.  Will place her on 2.5 mg amlodipine to take if her blood pressure is over 170/100.  Nursing aide at bedside understands the plan, and will inform the staff to be checking the patient's blood pressure at least once daily.  Patient to follow-up with her PCP about ongoing blood pressure management.  Advised to stop donepezil at this time, in case it was contributing to her symptoms. "  Outpatient Medications Prior to Visit  Medication Sig Dispense Refill   acetaminophen (TYLENOL) 500 MG tablet Take 1,000 mg by mouth every 6 (six) hours as needed for mild pain.     albuterol (VENTOLIN HFA) 108 (90 Base) MCG/ACT inhaler Take 2 puffs every 4-6 hours as needed     amLODipine (NORVASC) 2.5 MG tablet Take 1 tablet (2.5 mg total) by mouth daily as needed. Take if BP is above 170/100 14 tablet 0   Azelastine HCl 0.15 % SOLN Place into both nostrils. Place into both nostrils.     Calcium Carbonate-Vitamin D (CALCIUM-VITAMIN D) 500-200 MG-UNIT per tablet Take 3 tablets by mouth daily.     desonide (DESOWEN) 0.05 % cream Apply 1 application topically as needed.     diclofenac Sodium (VOLTAREN) 1 % GEL APPLY 4 GRAMS 4 TIMES DAILY. 200 g 3   EPINEPHrine (EPIPEN 2-PAK) 0.3 mg/0.3 mL IJ SOAJ injection Inject 0.3 mLs (0.3 mg total) into the muscle as needed. 1 Device 1   famotidine (PEPCID) 40 MG tablet Take 1 tablet (40 mg total) by mouth at bedtime. NEEDS OFFICE VISIT FOR ADDITIONAL REFILLS 30 tablet 2   hydrocortisone 2.5 % ointment Apply topically 2 (two) times daily.      ipratropium (ATROVENT) 0.06 % nasal spray Place 1 spray into the nose 3 (three) times daily as needed for rhinitis. 15 mL 0   levocetirizine (XYZAL) 5 MG tablet Take 5 mg by mouth every evening.     levothyroxine (SYNTHROID) 50 MCG tablet TAKE ONE TABLET BY MOUTH DAILY BEFORE BREAKFAST 30 tablet 5   LORazepam (ATIVAN) 1 MG tablet Take 2-3 tablets at bedtime and 1-2 tablets in the daytime as needed. 150 tablet 2   MEDROL 4 MG TBPK tablet Take by mouth.     montelukast (SINGULAIR) 10 MG tablet Take 1 tablet (10 mg total) by mouth at bedtime. 90 tablet 1   pantoprazole (PROTONIX) 40 MG tablet TAKE (1) TABLET TWICE A DAY BEFORE MEALS. (Patient taking differently: as needed.) 60 tablet 5   polyethylene glycol powder (GLYCOLAX/MIRALAX) 17 GM/SCOOP powder Take 17 g by mouth 2 (two) times daily as needed for moderate constipation. 500 g 3   pregabalin (LYRICA) 25 MG capsule take 1 capsule by oral route 2 times every day; start 1 qhsX5 days, then BID     simvastatin (ZOCOR) 40 MG tablet TAKE ONE TABLET ONCE DAILY 90 tablet 3   triamcinolone (NASACORT) 55 MCG/ACT AERO nasal inhaler Place 2 sprays into the nose as needed.     VESICARE 10 MG tablet Take 1 tablet (10 mg total)  by mouth daily as needed (bladder). 90 tablet 3   cefUROXime (CEFTIN) 250 MG tablet TAKE ONE TABLET BY MOUTH TWICE DAILY WITH A MEAL (Patient not taking: Reported on 12/15/2022) 14 tablet 0   donepezil (ARICEPT) 10 MG tablet Start Donepezil 10 mg :Take half tablet (5 mg) daily for 2 weeks, then increase to the full tablet at 10 mg daily. Side effects discussed (Patient not taking: Reported on 11/10/2022) 30 tablet 11   No facility-administered medications prior to visit.    ROS: Review of Systems  Constitutional:  Positive for fatigue. Negative for activity change, appetite change, chills and unexpected weight change.  HENT:  Negative for congestion, mouth sores and sinus pressure.   Eyes:  Negative for visual disturbance.   Respiratory:  Negative for cough and chest tightness.   Gastrointestinal:  Negative for abdominal pain and nausea.  Genitourinary:  Positive for frequency and urgency. Negative for difficulty urinating and vaginal pain.  Musculoskeletal:  Negative for back pain and gait problem.  Skin:  Negative for pallor and rash.  Neurological:  Negative for dizziness, tremors, weakness, numbness and headaches.  Psychiatric/Behavioral:  Positive for decreased concentration. Negative for confusion and sleep disturbance.     Objective:  BP 128/72 (BP Location: Left Arm, Patient Position: Sitting, Cuff Size: Normal)   Pulse 82   Temp 97.6 F (36.4 C) (Oral)   Ht 5' (1.524 m)   Wt 115 lb (52.2 kg)   SpO2 98%   BMI 22.46 kg/m   BP Readings from Last 3 Encounters:  12/15/22 128/72  12/08/22 (!) 152/101  11/30/22 120/72    Wt Readings from Last 3 Encounters:  12/15/22 115 lb (52.2 kg)  12/08/22 115 lb 15.4 oz (52.6 kg)  11/30/22 116 lb (52.6 kg)    Physical Exam Constitutional:      General: She is not in acute distress.    Appearance: Normal appearance. She is well-developed.  HENT:     Head: Normocephalic.     Right Ear: External ear normal.     Left Ear: External ear normal.     Nose: Nose normal.  Eyes:     General:        Right eye: No discharge.        Left eye: No discharge.     Conjunctiva/sclera: Conjunctivae normal.     Pupils: Pupils are equal, round, and reactive to light.  Neck:     Thyroid: No thyromegaly.     Vascular: No JVD.     Trachea: No tracheal deviation.  Cardiovascular:     Rate and Rhythm: Normal rate and regular rhythm.     Heart sounds: Normal heart sounds.  Pulmonary:     Effort: No respiratory distress.     Breath sounds: No stridor. No wheezing.  Abdominal:     General: Bowel sounds are normal. There is no distension.     Palpations: Abdomen is soft. There is no mass.     Tenderness: There is no abdominal tenderness. There is no guarding or  rebound.  Musculoskeletal:        General: No tenderness.     Cervical back: Normal range of motion and neck supple. No rigidity.  Lymphadenopathy:     Cervical: No cervical adenopathy.  Skin:    Findings: No erythema or rash.  Neurological:     Cranial Nerves: No cranial nerve deficit.     Motor: No abnormal muscle tone.     Coordination: Coordination normal.  Deep Tendon Reflexes: Reflexes normal.  Psychiatric:        Behavior: Behavior normal.        Thought Content: Thought content normal.        Judgment: Judgment normal.   sad  Lab Results  Component Value Date   WBC 6.9 12/08/2022   HGB 11.6 (L) 12/08/2022   HCT 34.4 (L) 12/08/2022   PLT 264 12/08/2022   GLUCOSE 102 (H) 12/08/2022   CHOL 186 02/17/2022   TRIG 141.0 02/17/2022   HDL 59.30 02/17/2022   LDLDIRECT 98.0 08/13/2021   LDLCALC 99 02/17/2022   ALT 28 11/16/2022   AST 22 11/16/2022   NA 131 (L) 12/08/2022   K 4.0 12/08/2022   CL 98 12/08/2022   CREATININE 0.57 12/08/2022   BUN 17 12/08/2022   CO2 24 12/08/2022   TSH 2.76 11/16/2022   HGBA1C 5.5 06/13/2020    CT Head Wo Contrast  Result Date: 12/08/2022 CLINICAL DATA:  Headache EXAM: CT HEAD WITHOUT CONTRAST TECHNIQUE: Contiguous axial images were obtained from the base of the skull through the vertex without intravenous contrast. RADIATION DOSE REDUCTION: This exam was performed according to the departmental dose-optimization program which includes automated exposure control, adjustment of the mA and/or kV according to patient size and/or use of iterative reconstruction technique. COMPARISON:  MRI 12/04/2022, CT brain 07/08/2022 FINDINGS: Brain: No acute territorial infarction or intracranial hemorrhage is visualized. Left frontal convexity hyperdense mass measuring 16 mm corresponding to history of meningioma, mildly calcified. Some mass effect on underlying brain parenchyma but no edema. Mild chronic small vessel ischemic changes of the white matter.  Stable ventricle size. Posterior fossa mildly obscured by artifact from hardware Vascular: No hyperdense vessels.  Carotid vascular calcification Skull: No acute fracture Sinuses/Orbits: No acute finding. Other: Hardware in the upper cervical spine. Chronic fracture deformity anterior and posterior arch of C1 IMPRESSION: 1. No CT evidence for acute intracranial abnormality. 2. Mild chronic small vessel ischemic changes of the white matter. Stable left frontal convexity meningioma. Electronically Signed   By: Donavan Foil M.D.   On: 12/08/2022 16:19    Assessment & Plan:   Problem List Items Addressed This Visit       Cardiovascular and Mediastinum   Essential hypertension, benign - Primary     s/p ER visit on 12/08/22 for SBP 180. Per Dr Tomi Bamberger: " Discussed with the patient that her restarting donepezil could have impacted her symptoms today.  However, her blood pressure continues to be mildly elevated.  Will place her on 2.5 mg amlodipine to take if her blood pressure is over 170/100.  Nursing aide at bedside understands the plan, and will inform the staff to be checking the patient's blood pressure at least once daily.  Patient to follow-up with her PCP about ongoing blood pressure management.  Advised to stop donepezil at this time, in case it was contributing to her symptoms. "  BP ok lately      Relevant Orders   Comprehensive metabolic panel   TSH   CBC with Differential/Platelet     Other   Fatigue   Relevant Orders   Comprehensive metabolic panel   TSH   CBC with Differential/Platelet   Anxiety    Lorazepam prn, - tolerance has developed  Potential benefits of a long term benzodiazepines  use as well as potential risks  and complications were explained to the patient and were aknowledged.         No orders of the  defined types were placed in this encounter.     Follow-up: Return in about 3 months (around 03/16/2023) for a follow-up visit.  Walker Kehr, MD

## 2022-12-15 NOTE — Assessment & Plan Note (Signed)
Lorazepam prn, - tolerance has developed  Potential benefits of a long term benzodiazepines  use as well as potential risks  and complications were explained to the patient and were aknowledged.

## 2022-12-15 NOTE — Assessment & Plan Note (Signed)
s/p ER visit on 12/08/22 for SBP 180. Per Dr Tomi Bamberger: " Discussed with the patient that her restarting donepezil could have impacted her symptoms today.  However, her blood pressure continues to be mildly elevated.  Will place her on 2.5 mg amlodipine to take if her blood pressure is over 170/100.  Nursing aide at bedside understands the plan, and will inform the staff to be checking the patient's blood pressure at least once daily.  Patient to follow-up with her PCP about ongoing blood pressure management.  Advised to stop donepezil at this time, in case it was contributing to her symptoms. "  BP ok lately

## 2022-12-15 NOTE — Assessment & Plan Note (Signed)
Chronic Take Vesicare 10 mg/d

## 2022-12-20 ENCOUNTER — Ambulatory Visit (INDEPENDENT_AMBULATORY_CARE_PROVIDER_SITE_OTHER): Payer: Medicare Other | Admitting: *Deleted

## 2022-12-20 ENCOUNTER — Telehealth: Payer: Self-pay | Admitting: Physician Assistant

## 2022-12-20 ENCOUNTER — Other Ambulatory Visit: Payer: Self-pay | Admitting: Internal Medicine

## 2022-12-20 ENCOUNTER — Other Ambulatory Visit: Payer: Self-pay | Admitting: Physician Assistant

## 2022-12-20 DIAGNOSIS — M810 Age-related osteoporosis without current pathological fracture: Secondary | ICD-10-CM | POA: Diagnosis not present

## 2022-12-20 DIAGNOSIS — Z1231 Encounter for screening mammogram for malignant neoplasm of breast: Secondary | ICD-10-CM

## 2022-12-20 MED ORDER — MEMANTINE HCL 5 MG PO TABS: ORAL_TABLET | ORAL | 11 refills | Status: DC

## 2022-12-20 MED ORDER — DENOSUMAB 60 MG/ML ~~LOC~~ SOSY
60.0000 mg | PREFILLED_SYRINGE | Freq: Once | SUBCUTANEOUS | Status: AC
  Administered 2022-12-20: 60 mg via SUBCUTANEOUS

## 2022-12-20 NOTE — Telephone Encounter (Signed)
Advised to patient of new medication sent in

## 2022-12-20 NOTE — Telephone Encounter (Signed)
Pt called in stating she had seen Dr. Alain Marion a little while back and he took her off of the donepezil because she was having bowel and bladder issues. Once she got better, he called Dr. Alain Marion back and he said she could go back on it. That caused the bowel and bladder issues again. She is no longer taking it now. She would like to see if there is something different she could take?

## 2022-12-21 ENCOUNTER — Ambulatory Visit (INDEPENDENT_AMBULATORY_CARE_PROVIDER_SITE_OTHER): Payer: Medicare Other | Admitting: Sports Medicine

## 2022-12-21 VITALS — BP 122/68 | Ht 60.0 in | Wt 116.0 lb

## 2022-12-21 DIAGNOSIS — G8929 Other chronic pain: Secondary | ICD-10-CM | POA: Diagnosis not present

## 2022-12-21 DIAGNOSIS — M545 Low back pain, unspecified: Secondary | ICD-10-CM

## 2022-12-21 DIAGNOSIS — M5136 Other intervertebral disc degeneration, lumbar region: Secondary | ICD-10-CM | POA: Insufficient documentation

## 2022-12-21 DIAGNOSIS — M51369 Other intervertebral disc degeneration, lumbar region without mention of lumbar back pain or lower extremity pain: Secondary | ICD-10-CM

## 2022-12-21 HISTORY — DX: Other intervertebral disc degeneration, lumbar region without mention of lumbar back pain or lower extremity pain: M51.369

## 2022-12-21 NOTE — Assessment & Plan Note (Signed)
We are trying to give her limited exercises Williams flexion series in a chair Standing lumbar ROM  Hip rotation and hip strength exercises helped her SI joint Keep these up 3x week  Heat/ topical volaren/ tylenol  Reck as needed

## 2022-12-21 NOTE — Progress Notes (Signed)
   Established Patient Office Visit  Subjective   Patient ID: [name], female    DOB: [birth year]  Age: 87 y.o. MRN: [id number]  CC: Lower back pain   [name] is an 87 year old female who presents for follow-up for chronic left-sided SI joint pain. She was last seen 11/30/22 and received an SI joint injection which brought significant relief. She has been doing her hip abduction exercises and stretches daily. She is happy about the improvement in her SI joint but notes a few days ago, she developed dull lower back pain. It is constant and has not worsened. She states that it is nothing compared to her SI joint pain. She denies numbness, tingling, recent trauma, and has not done any new activities.    Objective:     BP 122/68   Ht 5' (1.524 m)   Wt 116 lb (52.6 kg)   BMI 22.65 kg/m  Vital signs reviewed.   Physical Exam Lumbar spine  No obvious deformities. Mildly tender to palpation of lumbosacral vertebrae.  ROM intact, lateral flexion worsens pain. Extension somewhat limited Stands without difficulty Walks without limp No neurologic signs  Imaging  MRI Lumbar Spine  07/13/2022 IMPRESSION: 1. Multilevel grade 1 spondylolisthesis as detailed above is overall not significantly changed since 2010; however, associated multilevel disc degeneration has progressed. 2. Advanced facet arthropathy at L4-L5 and L5-S1 results in severe neural foraminal stenosis on the left at L4-L5 and bilaterally at L5-S1, and moderate spinal canal stenosis just below the level of the L4-L5 disc space. The foraminal stenosis at L5-S1 has worsened since 2010. 3. Moderate spinal canal stenosis at T11-T12 is new since 2000. 4. Moderate right worse than left neural foraminal stenosis at L1-L2 through L3-L4.   Assessment & Plan:   Problem List Items Addressed This Visit   None   Sabino Dick, MS4 [facility]  I observed and examined the patient with the medical student and agree with assessment  and plan.  Note reviewed and modified by me. Ila Mcgill, MD

## 2022-12-21 NOTE — Patient Instructions (Addendum)
Back Exercises:   Sitting in chair:  Slump Forward, hold for 3 breathes, 3 times once daily  Bend forward, wrap arms around knees, and pull in, 3 times once daily   Standing:  Forward flexion and backwards extension, 3 times once daily  Side to side leaning, 3 times once daily  Bend arms, put on top of each other, lateral rotation, 3 times once daily   Voltaren cream for lower back, continue using heat pad and taking Tylenol when you need it. Can also try ThermaCare Heat Pads from the drugstore

## 2022-12-29 ENCOUNTER — Other Ambulatory Visit (INDEPENDENT_AMBULATORY_CARE_PROVIDER_SITE_OTHER): Payer: Medicare Other

## 2022-12-29 DIAGNOSIS — N3281 Overactive bladder: Secondary | ICD-10-CM | POA: Diagnosis not present

## 2022-12-29 DIAGNOSIS — R5382 Chronic fatigue, unspecified: Secondary | ICD-10-CM

## 2022-12-29 DIAGNOSIS — M545 Low back pain, unspecified: Secondary | ICD-10-CM | POA: Diagnosis not present

## 2022-12-29 DIAGNOSIS — G8929 Other chronic pain: Secondary | ICD-10-CM

## 2022-12-29 DIAGNOSIS — I10 Essential (primary) hypertension: Secondary | ICD-10-CM | POA: Diagnosis not present

## 2022-12-29 LAB — COMPREHENSIVE METABOLIC PANEL
ALT: 18 U/L (ref 0–35)
AST: 20 U/L (ref 0–37)
Albumin: 4.4 g/dL (ref 3.5–5.2)
Alkaline Phosphatase: 38 U/L — ABNORMAL LOW (ref 39–117)
BUN: 12 mg/dL (ref 6–23)
CO2: 27 mEq/L (ref 19–32)
Calcium: 9.4 mg/dL (ref 8.4–10.5)
Chloride: 101 mEq/L (ref 96–112)
Creatinine, Ser: 0.7 mg/dL (ref 0.40–1.20)
GFR: 79.38 mL/min (ref >60.00)
Glucose, Bld: 87 mg/dL (ref 70–99)
Potassium: 4.2 mEq/L (ref 3.5–5.1)
Sodium: 136 mEq/L (ref 135–145)
Total Bilirubin: 0.5 mg/dL (ref 0.2–1.2)
Total Protein: 7.4 g/dL (ref 6.0–8.3)

## 2022-12-29 LAB — URINALYSIS
Bilirubin Urine: NEGATIVE
Hgb urine dipstick: NEGATIVE
Ketones, ur: NEGATIVE
Leukocytes,Ua: NEGATIVE
Nitrite: NEGATIVE
Specific Gravity, Urine: 1.01 (ref 1.000–1.030)
Total Protein, Urine: NEGATIVE
Urine Glucose: NEGATIVE
Urobilinogen, UA: 0.2 (ref 0.0–1.0)
pH: 6.5 (ref 5.0–8.0)

## 2022-12-29 LAB — CBC WITH DIFFERENTIAL/PLATELET
Basophils Absolute: 0 10*3/uL (ref 0.0–0.1)
Basophils Relative: 0.7 % (ref 0.0–3.0)
Eosinophils Absolute: 0.2 10*3/uL (ref 0.0–0.7)
Eosinophils Relative: 2.7 % (ref 0.0–5.0)
HCT: 37 % (ref 36.0–46.0)
Hemoglobin: 12.7 g/dL (ref 12.0–15.0)
Lymphocytes Relative: 17.8 % (ref 12.0–46.0)
Lymphs Abs: 1.1 10*3/uL (ref 0.7–4.0)
MCHC: 34.5 g/dL (ref 30.0–36.0)
MCV: 95.6 fl (ref 78.0–100.0)
Monocytes Absolute: 0.8 10*3/uL (ref 0.1–1.0)
Monocytes Relative: 12 % (ref 3.0–12.0)
Neutro Abs: 4.2 10*3/uL (ref 1.4–7.7)
Neutrophils Relative %: 66.8 % (ref 43.0–77.0)
Platelets: 324 10*3/uL (ref 150.0–400.0)
RBC: 3.87 Mil/uL (ref 3.87–5.11)
RDW: 12.9 % (ref 11.5–15.5)
WBC: 6.3 10*3/uL (ref 4.0–10.5)

## 2022-12-29 LAB — TSH: TSH: 2.24 u[IU]/mL (ref 0.35–5.50)

## 2023-01-05 ENCOUNTER — Ambulatory Visit (INDEPENDENT_AMBULATORY_CARE_PROVIDER_SITE_OTHER): Payer: Medicare Other | Admitting: Internal Medicine

## 2023-01-05 ENCOUNTER — Encounter: Payer: Self-pay | Admitting: Internal Medicine

## 2023-01-05 VITALS — BP 118/70 | HR 100 | Temp 98.6°F | Wt 114.0 lb

## 2023-01-05 DIAGNOSIS — I10 Essential (primary) hypertension: Secondary | ICD-10-CM

## 2023-01-05 DIAGNOSIS — R413 Other amnesia: Secondary | ICD-10-CM

## 2023-01-05 DIAGNOSIS — R269 Unspecified abnormalities of gait and mobility: Secondary | ICD-10-CM

## 2023-01-05 DIAGNOSIS — J069 Acute upper respiratory infection, unspecified: Secondary | ICD-10-CM

## 2023-01-05 DIAGNOSIS — F419 Anxiety disorder, unspecified: Secondary | ICD-10-CM

## 2023-01-05 NOTE — Progress Notes (Signed)
Subjective:  Patient ID: [name], female    DOB: Dec 27, 1935  Age: 87 y.o. MRN: [id number]  CC: Follow-up   HPI REIKA CALLANAN presents for memory loss - on Mematine low dose now. F/u on HTN, anxiety. C/o OAB sx's... Disha saw Dr  Oneida Alar for [name]  Outpatient Medications Prior to Visit  Medication Sig Dispense Refill   acetaminophen (TYLENOL) 500 MG tablet Take 1,000 mg by mouth every 6 (six) hours as needed for mild pain.     albuterol (VENTOLIN HFA) 108 (90 Base) MCG/ACT inhaler Take 2 puffs every 4-6 hours as needed     amLODipine (NORVASC) 2.5 MG tablet Take 1 tablet (2.5 mg total) by mouth daily as needed. Take if BP is above 170/100 14 tablet 0   Azelastine HCl 0.15 % SOLN Place into both nostrils. Place into both nostrils.     Calcium Carbonate-Vitamin D (CALCIUM-VITAMIN D) 500-200 MG-UNIT per tablet Take 3 tablets by mouth daily.     desonide (DESOWEN) 0.05 % cream Apply 1 application topically as needed.     diclofenac Sodium (VOLTAREN) 1 % GEL APPLY 4 GRAMS 4 TIMES DAILY. 200 g 3   EPINEPHrine (EPIPEN 2-PAK) 0.3 mg/0.3 mL IJ SOAJ injection Inject 0.3 mLs (0.3 mg total) into the muscle as needed. 1 Device 1   famotidine (PEPCID) 40 MG tablet Take 1 tablet (40 mg total) by mouth at bedtime. NEEDS OFFICE VISIT FOR ADDITIONAL REFILLS 30 tablet 2   hydrocortisone 2.5 % ointment Apply topically 2 (two) times daily.     ipratropium (ATROVENT) 0.06 % nasal spray Place 1 spray into the nose 3 (three) times daily as needed for rhinitis. 15 mL 0   levocetirizine (XYZAL) 5 MG tablet Take 5 mg by mouth every evening.     levothyroxine (SYNTHROID) 50 MCG tablet TAKE ONE TABLET BY MOUTH DAILY BEFORE BREAKFAST 30 tablet 5   LORazepam (ATIVAN) 1 MG tablet Take 2-3 tablets at bedtime and 1-2 tablets in the daytime as needed. 150 tablet 2   MEDROL 4 MG TBPK tablet Take by mouth.     memantine (NAMENDA) 5 MG tablet Take 1 tablet (5 mg at night) for 2 weeks, then increase to 1 tablet (5 mg) twice  a day 60 tablet 11   montelukast (SINGULAIR) 10 MG tablet Take 1 tablet (10 mg total) by mouth at bedtime. 90 tablet 1   pantoprazole (PROTONIX) 40 MG tablet TAKE (1) TABLET TWICE A DAY BEFORE MEALS. (Patient taking differently: as needed.) 60 tablet 5   polyethylene glycol powder (GLYCOLAX/MIRALAX) 17 GM/SCOOP powder Take 17 g by mouth 2 (two) times daily as needed for moderate constipation. 500 g 3   pregabalin (LYRICA) 25 MG capsule take 1 capsule by oral route 2 times every day; start 1 qhsX5 days, then BID     simvastatin (ZOCOR) 40 MG tablet TAKE ONE TABLET ONCE DAILY 90 tablet 3   triamcinolone (NASACORT) 55 MCG/ACT AERO nasal inhaler Place 2 sprays into the nose as needed.     VESICARE 10 MG tablet Take 1 tablet (10 mg total) by mouth daily as needed (bladder). 90 tablet 3   cefUROXime (CEFTIN) 250 MG tablet TAKE ONE TABLET BY MOUTH TWICE DAILY WITH A MEAL (Patient not taking: Reported on 12/15/2022) 14 tablet 0   No facility-administered medications prior to visit.    ROS: Review of Systems  Constitutional:  Positive for fatigue. Negative for activity change, appetite change, chills and unexpected weight change.  HENT:  Negative for congestion, mouth sores and sinus pressure.   Eyes:  Negative for visual disturbance.  Respiratory:  Negative for cough and chest tightness.   Gastrointestinal:  Negative for abdominal pain and nausea.  Genitourinary:  Positive for frequency and urgency. Negative for difficulty urinating and vaginal pain.  Musculoskeletal:  Positive for arthralgias, neck pain and neck stiffness. Negative for back pain and gait problem.  Skin:  Negative for pallor and rash.  Neurological:  Negative for dizziness, tremors, weakness, numbness and headaches.  Psychiatric/Behavioral:  Positive for decreased concentration. Negative for confusion, sleep disturbance and suicidal ideas. The patient is nervous/anxious.     Objective:  BP 118/70 (BP Location: Right Arm, Patient  Position: Sitting, Cuff Size: Normal)   Pulse 100   Temp 98.6 F (37 C) (Oral)   Wt 114 lb (51.7 kg)   SpO2 98%   BMI 22.26 kg/m   BP Readings from Last 3 Encounters:  01/05/23 118/70  12/21/22 122/68  12/15/22 128/72    Wt Readings from Last 3 Encounters:  01/05/23 114 lb (51.7 kg)  12/21/22 116 lb (52.6 kg)  12/15/22 115 lb (52.2 kg)    Physical Exam Constitutional:      General: She is not in acute distress.    Appearance: Normal appearance. She is well-developed.  HENT:     Head: Normocephalic.     Right Ear: External ear normal.     Left Ear: External ear normal.     Nose: Nose normal.  Eyes:     General:        Right eye: No discharge.        Left eye: No discharge.     Conjunctiva/sclera: Conjunctivae normal.     Pupils: Pupils are equal, round, and reactive to light.  Neck:     Thyroid: No thyromegaly.     Vascular: No JVD.     Trachea: No tracheal deviation.  Cardiovascular:     Rate and Rhythm: Normal rate and regular rhythm.     Heart sounds: Normal heart sounds.  Pulmonary:     Effort: No respiratory distress.     Breath sounds: No stridor. No wheezing.  Abdominal:     General: Bowel sounds are normal. There is no distension.     Palpations: Abdomen is soft. There is no mass.     Tenderness: There is no abdominal tenderness. There is right CVA tenderness. There is no guarding or rebound.  Musculoskeletal:        General: No tenderness.     Cervical back: Normal range of motion and neck supple. No rigidity.  Lymphadenopathy:     Cervical: No cervical adenopathy.  Skin:    Findings: No erythema or rash.  Neurological:     Cranial Nerves: No cranial nerve deficit.     Motor: No abnormal muscle tone.     Coordination: Coordination normal.     Deep Tendon Reflexes: Reflexes normal.  Psychiatric:        Behavior: Behavior normal.        Thought Content: Thought content normal.        Judgment: Judgment normal.   Neck is stiff  Lab Results   Component Value Date   WBC 6.3 12/29/2022   HGB 12.7 12/29/2022   HCT 37.0 12/29/2022   PLT 324.0 12/29/2022   GLUCOSE 87 12/29/2022   CHOL 186 02/17/2022   TRIG 141.0 02/17/2022   HDL 59.30 02/17/2022   LDLDIRECT 98.0 08/13/2021  LDLCALC 99 02/17/2022   ALT 18 12/29/2022   AST 20 12/29/2022   NA 136 12/29/2022   K 4.2 12/29/2022   CL 101 12/29/2022   CREATININE 0.70 12/29/2022   BUN 12 12/29/2022   CO2 27 12/29/2022   TSH 2.24 12/29/2022   HGBA1C 5.5 06/13/2020    CT Head Wo Contrast  Result Date: 12/08/2022 CLINICAL DATA:  Headache EXAM: CT HEAD WITHOUT CONTRAST TECHNIQUE: Contiguous axial images were obtained from the base of the skull through the vertex without intravenous contrast. RADIATION DOSE REDUCTION: This exam was performed according to the departmental dose-optimization program which includes automated exposure control, adjustment of the mA and/or kV according to patient size and/or use of iterative reconstruction technique. COMPARISON:  MRI 12/04/2022, CT brain 07/08/2022 FINDINGS: Brain: No acute territorial infarction or intracranial hemorrhage is visualized. Left frontal convexity hyperdense mass measuring 16 mm corresponding to history of meningioma, mildly calcified. Some mass effect on underlying brain parenchyma but no edema. Mild chronic small vessel ischemic changes of the white matter. Stable ventricle size. Posterior fossa mildly obscured by artifact from hardware Vascular: No hyperdense vessels.  Carotid vascular calcification Skull: No acute fracture Sinuses/Orbits: No acute finding. Other: Hardware in the upper cervical spine. Chronic fracture deformity anterior and posterior arch of C1 IMPRESSION: 1. No CT evidence for acute intracranial abnormality. 2. Mild chronic small vessel ischemic changes of the white matter. Stable left frontal convexity meningioma. Electronically Signed   By: Donavan Foil M.D.   On: 12/08/2022 16:19    Assessment & Plan:    Problem List Items Addressed This Visit       Cardiovascular and Mediastinum   Essential hypertension, benign   Relevant Orders   Urinalysis     Respiratory   Upper respiratory infection    On Rx        Other   Memory impairment    Better On Mamentine      Relevant Orders   Urinalysis   Gait disorder - Primary    [name] resolved after doing exercises by Dr Oneida Alar      Relevant Orders   TSH   Comprehensive metabolic panel   Urinalysis   Anxiety    Lorazepam prn, - tolerance has developed  Potential benefits of a long term benzodiazepines  use as well as potential risks  and complications were explained to the patient and were aknowledged.      Relevant Orders   TSH   Comprehensive metabolic panel      No orders of the defined types were placed in this encounter.     Follow-up: Return in about 3 months (around 04/05/2023) for a follow-up visit.  Walker Kehr, MD

## 2023-01-05 NOTE — Assessment & Plan Note (Signed)
On Rx 

## 2023-01-05 NOTE — Assessment & Plan Note (Addendum)
Better On Mamentine

## 2023-01-05 NOTE — Assessment & Plan Note (Signed)
Lorazepam prn, - tolerance has developed  Potential benefits of a long term benzodiazepines  use as well as potential risks  and complications were explained to the patient and were aknowledged.

## 2023-01-05 NOTE — Assessment & Plan Note (Signed)
[name] resolved after doing exercises by Dr Oneida Alar

## 2023-01-10 DIAGNOSIS — M461 Sacroiliitis, not elsewhere classified: Secondary | ICD-10-CM | POA: Diagnosis not present

## 2023-01-14 ENCOUNTER — Encounter: Payer: Self-pay | Admitting: Family Medicine

## 2023-01-14 ENCOUNTER — Ambulatory Visit: Payer: Self-pay

## 2023-01-14 ENCOUNTER — Ambulatory Visit (INDEPENDENT_AMBULATORY_CARE_PROVIDER_SITE_OTHER): Payer: Medicare Other | Admitting: Family Medicine

## 2023-01-14 VITALS — BP 138/64 | Ht 60.0 in | Wt 114.0 lb

## 2023-01-14 DIAGNOSIS — M533 Sacrococcygeal disorders, not elsewhere classified: Secondary | ICD-10-CM | POA: Insufficient documentation

## 2023-01-14 DIAGNOSIS — M79604 Pain in right leg: Secondary | ICD-10-CM

## 2023-01-14 HISTORY — DX: Sacrococcygeal disorders, not elsewhere classified: M53.3

## 2023-01-14 MED ORDER — METHYLPREDNISOLONE ACETATE 40 MG/ML IJ SUSP
40.0000 mg | Freq: Once | INTRAMUSCULAR | Status: AC
  Administered 2023-01-14: 40 mg via INTRA_ARTICULAR

## 2023-01-14 NOTE — Progress Notes (Signed)
Established Patient Office Visit  Subjective   Patient ID: [name], female    DOB: October 29, 1936  Age: 87 y.o. MRN: [id number]  Right-sided hip and back pain.  [name] is here today with chief complaint of right-sided hip and low back pain.  She reports this began yesterday after no injury or event.  She had some similar pain on her left side that improved after landmark guided SI joint injection and some hip strengthening exercises.  She reports her pain is located in the right side of her low back and gluteus area.  She has some slight radiation down into her buttocks but not into her leg.  She denies any numbness or tingling or weakness into her leg.  She has been regular with her exercises on both her right and left hips but reports she could not do them this morning secondary to her discomfort.  She took 2 Tylenol and tramadol.  She has been using Voltaren ice and heat to the area with only temporary relief.   ROS as listed above HPI    Objective:     BP 138/64   Ht 5' (1.524 m)   Wt 114 lb (51.7 kg)   BMI 22.26 kg/m   Physical Exam Vitals reviewed.  Constitutional:      General: She is not in acute distress.    Appearance: Normal appearance. She is not ill-appearing, toxic-appearing or diaphoretic.  Pulmonary:     Effort: Pulmonary effort is normal.  Neurological:     Mental Status: She is alert.   Right low back: No obvious deformity or asymmetry.  No ecchymosis or rash present.  Tenderness to palpation at the SI joint.  Good range of motion at the hip.  Strength 5/5 resisted hip flexion and abduction.  Sensation intact to light touch.  Reflexes symmetric bilaterally at the patella and Achilles.  Negative seated straight leg raise testing.  Normal gait    Assessment & Plan:   Problem List Items Addressed This Visit       Other   Pain of right sacroiliac joint - Primary    Patient began having right-sided SI joint pain that is causing her a lot of pain and  difficulty doing her activities of daily living and previous rehab exercises.  She was requesting today an injection.  Under ultrasound guidance her right SI joint was injected, see details below.  She tolerated this procedure well.  I recommended she take a couple days off from her exercises but begin them after about 2 days again.  Will follow-up with her in 4 to 6 weeks if symptoms worsen or fail to improve.      Relevant Orders   [place] LIMITED JOINT SPACE STRUCTURES LOW RIGHT   U/S-guided SI-joint injection, right   After discussion of risk/benefits/indications, informed verbal consent was obtained. A timeout was then performed. The patient was positioned in a prone position on exam room table with a pillow placed under the pelvis for mild hip flexion. The SI joint area was cleaned and prepped with chlorhexidine and alcohol swabs. Ultrasound gel was applied and the ultrasound transducer was placed in an anatomic axial plane over the PSIS, then moved distally over the SI-joint. Using ultrasound guidance, a 22-gauge, 3.5" needle was inserted from a lateral to medial approach utilizing an in-plane approach and directed into the SI-joint. The SI-joint was then injected with a mixture of 3:1 lidocaine:depomedrol with visualization of the injectate flow into the SI-joint under  ultrasound visualization. The patient tolerated the procedure well without immediate complications.   Return in about 6 weeks (around 02/25/2023).    Elmore Guise, DO

## 2023-01-14 NOTE — Progress Notes (Signed)
[facility]: Attending Note: I have examined the patient, reviewed the chart, discussed the assessment and plan with the Sports Medicine Fellow. I agree with assessment and treatment plan as detailed in the [place] note. I was present and participated in US guided SI joint injection.

## 2023-01-14 NOTE — Assessment & Plan Note (Signed)
Patient began having right-sided SI joint pain that is causing her a lot of pain and difficulty doing her activities of daily living and previous rehab exercises.  She was requesting today an injection.  Under ultrasound guidance her right SI joint was injected, see details below.  She tolerated this procedure well.  I recommended she take a couple days off from her exercises but begin them after about 2 days again.  Will follow-up with her in 4 to 6 weeks if symptoms worsen or fail to improve.

## 2023-01-17 ENCOUNTER — Encounter: Payer: Self-pay | Admitting: Internal Medicine

## 2023-01-17 ENCOUNTER — Ambulatory Visit (INDEPENDENT_AMBULATORY_CARE_PROVIDER_SITE_OTHER): Payer: Medicare Other | Admitting: Internal Medicine

## 2023-01-17 VITALS — BP 140/72 | HR 88 | Ht 60.0 in | Wt 114.0 lb

## 2023-01-17 DIAGNOSIS — S81801A Unspecified open wound, right lower leg, initial encounter: Secondary | ICD-10-CM | POA: Insufficient documentation

## 2023-01-17 DIAGNOSIS — G8929 Other chronic pain: Secondary | ICD-10-CM | POA: Diagnosis not present

## 2023-01-17 DIAGNOSIS — W1800XA Striking against unspecified object with subsequent fall, initial encounter: Secondary | ICD-10-CM | POA: Diagnosis not present

## 2023-01-17 DIAGNOSIS — M545 Low back pain, unspecified: Secondary | ICD-10-CM

## 2023-01-17 MED ORDER — MUPIROCIN 2 % EX OINT: TOPICAL_OINTMENT | CUTANEOUS | 0 refills | Status: DC

## 2023-01-17 MED ORDER — "TELFA ADHESIVE DRESSING 3""X4"" PADS": MEDICATED_PAD | 0 refills | Status: DC

## 2023-01-17 NOTE — Assessment & Plan Note (Signed)
Status post fall.  Contusion.  Sports medicine appointment tomorrow.  Tylenol as needed

## 2023-01-17 NOTE — Assessment & Plan Note (Addendum)
New Right lower leg V-shaped wound 7 x 7 cm.  No signs of infection.  Probable partial dry necrosis of the free skin flap. Wound is clean Use Rx Bactroban/Telfa/Gauze/Ace (do not make it tight) Wound Clinic ref

## 2023-01-17 NOTE — Patient Instructions (Signed)
  Use Rx Bactroban/Telfa/Gauze/Ace (do not make it tight); check wound/re-dress daily.

## 2023-01-17 NOTE — Progress Notes (Signed)
Subjective:  Patient ID: [name], female    DOB: 23-Oct-1936  Age: 87 y.o. MRN: [id number]  CC: No chief complaint on file.   HPI TEKILA WEISMAN presents for R leg wound - hit a cleaning supplies bucket w/her leg on 2/12. C/o R hip/R buttock. Pt saw Dr Oneida Alar assistant on Fri. Pt had a sterid shot    Outpatient Medications Prior to Visit  Medication Sig Dispense Refill   acetaminophen (TYLENOL) 500 MG tablet Take 1,000 mg by mouth every 6 (six) hours as needed for mild pain.     albuterol (VENTOLIN HFA) 108 (90 Base) MCG/ACT inhaler Take 2 puffs every 4-6 hours as needed     amLODipine (NORVASC) 2.5 MG tablet Take 1 tablet (2.5 mg total) by mouth daily as needed. Take if BP is above 170/100 14 tablet 0   Azelastine HCl 0.15 % SOLN Place into both nostrils. Place into both nostrils.     Calcium Carbonate-Vitamin D (CALCIUM-VITAMIN D) 500-200 MG-UNIT per tablet Take 3 tablets by mouth daily.     desonide (DESOWEN) 0.05 % cream Apply 1 application topically as needed.     diclofenac Sodium (VOLTAREN) 1 % GEL APPLY 4 GRAMS 4 TIMES DAILY. 200 g 3   EPINEPHrine (EPIPEN 2-PAK) 0.3 mg/0.3 mL IJ SOAJ injection Inject 0.3 mLs (0.3 mg total) into the muscle as needed. 1 Device 1   famotidine (PEPCID) 40 MG tablet Take 1 tablet (40 mg total) by mouth at bedtime. NEEDS OFFICE VISIT FOR ADDITIONAL REFILLS 30 tablet 2   hydrocortisone 2.5 % ointment Apply topically 2 (two) times daily.     ipratropium (ATROVENT) 0.06 % nasal spray Place 1 spray into the nose 3 (three) times daily as needed for rhinitis. 15 mL 0   levocetirizine (XYZAL) 5 MG tablet Take 5 mg by mouth every evening.     levothyroxine (SYNTHROID) 50 MCG tablet TAKE ONE TABLET BY MOUTH DAILY BEFORE BREAKFAST 30 tablet 5   LORazepam (ATIVAN) 1 MG tablet Take 2-3 tablets at bedtime and 1-2 tablets in the daytime as needed. 150 tablet 2   memantine (NAMENDA) 5 MG tablet Take 1 tablet (5 mg at night) for 2 weeks, then increase to 1 tablet  (5 mg) twice a day 60 tablet 11   montelukast (SINGULAIR) 10 MG tablet Take 1 tablet (10 mg total) by mouth at bedtime. 90 tablet 1   pantoprazole (PROTONIX) 40 MG tablet TAKE (1) TABLET TWICE A DAY BEFORE MEALS. (Patient taking differently: as needed.) 60 tablet 5   polyethylene glycol powder (GLYCOLAX/MIRALAX) 17 GM/SCOOP powder Take 17 g by mouth 2 (two) times daily as needed for moderate constipation. 500 g 3   pregabalin (LYRICA) 25 MG capsule take 1 capsule by oral route 2 times every day; start 1 qhsX5 days, then BID     simvastatin (ZOCOR) 40 MG tablet TAKE ONE TABLET ONCE DAILY 90 tablet 3   triamcinolone (NASACORT) 55 MCG/ACT AERO nasal inhaler Place 2 sprays into the nose as needed.     VESICARE 10 MG tablet Take 1 tablet (10 mg total) by mouth daily as needed (bladder). 90 tablet 3   cefUROXime (CEFTIN) 250 MG tablet TAKE ONE TABLET BY MOUTH TWICE DAILY WITH A MEAL (Patient not taking: Reported on 12/15/2022) 14 tablet 0   No facility-administered medications prior to visit.    ROS: Review of Systems  Constitutional:  Negative for activity change, appetite change, chills, diaphoresis, fatigue, fever and unexpected weight change.  HENT:  Negative for congestion, mouth sores and sinus pressure.   Eyes:  Negative for visual disturbance.  Respiratory:  Negative for cough and chest tightness.   Gastrointestinal:  Negative for abdominal pain and nausea.  Genitourinary:  Negative for difficulty urinating, frequency and vaginal pain.  Musculoskeletal:  Positive for back pain. Negative for gait problem.  Skin:  Positive for wound. Negative for pallor and rash.  Neurological:  Negative for dizziness, tremors, weakness, numbness and headaches.  Psychiatric/Behavioral:  Negative for confusion and sleep disturbance.     Objective:  BP (!) 140/72 (BP Location: Right Arm, Patient Position: Sitting, Cuff Size: Normal)   Pulse 88   Ht 5' (1.524 m)   Wt 114 lb (51.7 kg)   SpO2 96%   BMI  22.26 kg/m   BP Readings from Last 3 Encounters:  01/17/23 (!) 140/72  01/14/23 138/64  01/05/23 118/70    Wt Readings from Last 3 Encounters:  01/17/23 114 lb (51.7 kg)  01/14/23 114 lb (51.7 kg)  01/05/23 114 lb (51.7 kg)    Physical Exam Constitutional:      General: She is not in acute distress.    Appearance: Normal appearance. She is well-developed.  HENT:     Head: Normocephalic.     Right Ear: External ear normal.     Left Ear: External ear normal.     Nose: Nose normal.  Eyes:     General:        Right eye: No discharge.        Left eye: No discharge.     Conjunctiva/sclera: Conjunctivae normal.     Pupils: Pupils are equal, round, and reactive to light.  Neck:     Thyroid: No thyromegaly.     Vascular: No JVD.     Trachea: No tracheal deviation.  Cardiovascular:     Rate and Rhythm: Normal rate and regular rhythm.     Heart sounds: Normal heart sounds.  Pulmonary:     Effort: No respiratory distress.     Breath sounds: No stridor. No wheezing.  Abdominal:     General: Bowel sounds are normal. There is no distension.     Palpations: Abdomen is soft. There is no mass.     Tenderness: There is no abdominal tenderness. There is no guarding or rebound.  Musculoskeletal:        General: Tenderness present.     Cervical back: Normal range of motion and neck supple. No rigidity.     Right lower leg: No edema.     Left lower leg: No edema.  Lymphadenopathy:     Cervical: No cervical adenopathy.  Skin:    Findings: Bruising present. No erythema or rash.  Neurological:     Cranial Nerves: No cranial nerve deficit.     Motor: No weakness or abnormal muscle tone.     Coordination: Coordination normal.     Deep Tendon Reflexes: Reflexes normal.  Psychiatric:        Behavior: Behavior normal.        Thought Content: Thought content normal.        Judgment: Judgment normal.   Right lower leg V-shaped wound 7 x 7 cm.  No signs of infection.  Probable partial  dry necrosis of the free skin flap.    Lab Results  Component Value Date   WBC 6.3 12/29/2022   HGB 12.7 12/29/2022   HCT 37.0 12/29/2022   PLT 324.0 12/29/2022   GLUCOSE 87  12/29/2022   CHOL 186 02/17/2022   TRIG 141.0 02/17/2022   HDL 59.30 02/17/2022   LDLDIRECT 98.0 08/13/2021   LDLCALC 99 02/17/2022   ALT 18 12/29/2022   AST 20 12/29/2022   NA 136 12/29/2022   K 4.2 12/29/2022   CL 101 12/29/2022   CREATININE 0.70 12/29/2022   BUN 12 12/29/2022   CO2 27 12/29/2022   TSH 2.24 12/29/2022   HGBA1C 5.5 06/13/2020    CT Head Wo Contrast  Result Date: 12/08/2022 CLINICAL DATA:  Headache EXAM: CT HEAD WITHOUT CONTRAST TECHNIQUE: Contiguous axial images were obtained from the base of the skull through the vertex without intravenous contrast. RADIATION DOSE REDUCTION: This exam was performed according to the departmental dose-optimization program which includes automated exposure control, adjustment of the mA and/or kV according to patient size and/or use of iterative reconstruction technique. COMPARISON:  MRI 12/04/2022, CT brain 07/08/2022 FINDINGS: Brain: No acute territorial infarction or intracranial hemorrhage is visualized. Left frontal convexity hyperdense mass measuring 16 mm corresponding to history of meningioma, mildly calcified. Some mass effect on underlying brain parenchyma but no edema. Mild chronic small vessel ischemic changes of the white matter. Stable ventricle size. Posterior fossa mildly obscured by artifact from hardware Vascular: No hyperdense vessels.  Carotid vascular calcification Skull: No acute fracture Sinuses/Orbits: No acute finding. Other: Hardware in the upper cervical spine. Chronic fracture deformity anterior and posterior arch of C1 IMPRESSION: 1. No CT evidence for acute intracranial abnormality. 2. Mild chronic small vessel ischemic changes of the white matter. Stable left frontal convexity meningioma. Electronically Signed   By: Donavan Foil M.D.    On: 12/08/2022 16:19    Assessment & Plan:   Problem List Items Addressed This Visit       Other   Low back pain    Status post fall.  Contusion.  Sports medicine appointment tomorrow.  Tylenol as needed      Leg wound, right, initial encounter - Primary    New Right lower leg V-shaped wound 7 x 7 cm.  No signs of infection.  Probable partial dry necrosis of the free skin flap. Wound is clean Use Rx Bactroban/Telfa/Gauze/Ace (do not make it tight) Wound Clinic ref      Relevant Orders   Ambulatory referral to Wound Clinic   Fall against object    New.  Probable right buttock and right hip contusion.  Sports medicine appointment tomorrow.         Meds ordered this encounter  Medications   mupirocin ointment (BACTROBAN) 2 %    Sig: On leg wound w/dressing change qd    Dispense:  60 g    Refill:  0   Gauze Pads & Dressings (TELFA ADHESIVE DRESSING) 3"X4" PADS    Sig: Use qd    Dispense:  20 each    Refill:  0      Follow-up: Return in about 4 weeks (around 02/14/2023) for a follow-up visit.  Walker Kehr, MD

## 2023-01-17 NOTE — Assessment & Plan Note (Signed)
New.  Probable right buttock and right hip contusion.  Sports medicine appointment tomorrow.

## 2023-01-18 ENCOUNTER — Ambulatory Visit (INDEPENDENT_AMBULATORY_CARE_PROVIDER_SITE_OTHER): Payer: Medicare Other | Admitting: Sports Medicine

## 2023-01-18 VITALS — BP 138/68 | Ht 60.5 in | Wt 116.0 lb

## 2023-01-18 DIAGNOSIS — M533 Sacrococcygeal disorders, not elsewhere classified: Secondary | ICD-10-CM | POA: Diagnosis not present

## 2023-01-18 NOTE — Patient Instructions (Signed)
It was great to see you today!  -Please restart the hip abduction series we gave you previously -Use a small ball to do a piriformis ball massage for 5 minutes once or twice daily. -Add the seated hip rotation stretch you were shown today  Call with any questions.

## 2023-01-18 NOTE — Progress Notes (Signed)
  [name] - 87 y.o. female MRN [id number]  Date of birth: [birth year]  SUBJECTIVE:  Including CC & ROS.  CC: Follow up for SI joint/hip pain  HPI: Patient is presenting for follow up of hip pain. She notes she had a fall on 2/12 where she tripped on a box, fell forward, and cut her R ankle. Denies falling on hip or any anterior hip pain. She had an appointment here on 2/16 for evaluation of hip pain over the week and received an SI joint injection, but forgot to mention to provider about the fall. She is concerned that the fall and hip pain are related, and is worried about continuing exercises or activity. Hip pain has improved since the injection, only being uncomfortable with walking where as before it was hurting at rest as well. It started similarly to her previous hip pain on the left side, which has greatly improved with home exercises.    HISTORY: Past Medical, Surgical, Social, and Family History Reviewed & Updated per EMR.   Pertinent Historical Findings include: none   PHYSICAL EXAM:  VS: BP:138/68  HR: bpm  TEMP: ( )  RESP:   HT:5' 0.5" (153.7 cm)   WT:116 lb (52.6 kg)  BMI:22.27 PHYSICAL EXAM: Gen: Well appearing woman in NAD, bandaged lower R ankle  R Hip:  - Inspection: No gross deformity, no swelling, erythema, or ecchymosis - Palpation: TTP over posterior glute and IT band. No TTP over anterior hip.  - ROM: Normal and symmetric ROM hip flexion, internal and external rotation bilaterally - Strength: 4/5 strength on lying R hip abduction, 5/5 strength on hip flexion and extension - Neuro/vasc: NV intact distally - Special Tests: Negative FABER and FADIR.  Negative Scour.  ASSESSMENT & PLAN: See problem based charting & AVS for pt instructions.  R hip pain - likely continues to have R hip rotator muscle weakness/tendinopathy.  Her point of maximum tenderness locates directly over her piriformis muscle today.  Unlikely to have intraarticular pathology with good ROM, no  anterior hip pain. Recommended continuing strengthening exercises as well as stretching for the muscles. Can continue other pain relief modalities with heat/topical NSAIDs/Tylenol.   Estevan Oaks, MS4 [employer] [employer]  I observed and examined the patient with the medical student and agree with assessment and plan.  Exam and history repeated, Note reviewed and modified by me. [name] Mcgill, MD

## 2023-01-18 NOTE — Assessment & Plan Note (Signed)
Pain is now localizing more over her piriformis and seems primarily muscular.  Her pain is triggered by resisted hip rotation. I suspect both of these issues were caused by her recent fall . Good response to injection and we will now give her hip exercises which really helped her left hip and reevaluate her in 4 to 6 weeks unless symptoms have resolved

## 2023-01-19 ENCOUNTER — Telehealth: Payer: Self-pay | Admitting: Internal Medicine

## 2023-01-19 ENCOUNTER — Other Ambulatory Visit (HOSPITAL_COMMUNITY): Payer: Self-pay

## 2023-01-19 NOTE — Telephone Encounter (Signed)
Pt called stated the pharmacy called her stating that Dr. Camila Li needs to give Prior Authorization for levothyroxine (SYNTHROID) 50 MCG tablet

## 2023-01-19 NOTE — Telephone Encounter (Addendum)
Pharmacy Patient Advocate Encounter  Prior Authorization for Synthroid has been approved.    PA# [id number] Effective dates: 01/19/2023 through 11/29/2023    Patient Advocate Encounter   Received notification from [facility] that prior authorization for Synthroid 50 mcg is required.   PA submitted on 01/19/2023 [facility] Status is pending

## 2023-01-20 NOTE — Telephone Encounter (Signed)
Okay.  Thanks.

## 2023-01-26 ENCOUNTER — Other Ambulatory Visit: Payer: Self-pay | Admitting: Internal Medicine

## 2023-01-27 ENCOUNTER — Ambulatory Visit (INDEPENDENT_AMBULATORY_CARE_PROVIDER_SITE_OTHER): Payer: Medicare Other | Admitting: Sports Medicine

## 2023-01-27 VITALS — BP 136/66 | Ht 60.5 in | Wt 116.0 lb

## 2023-01-27 DIAGNOSIS — M25551 Pain in right hip: Secondary | ICD-10-CM

## 2023-01-27 NOTE — Progress Notes (Signed)
  [name] - 87 y.o. female MRN [id number]  Date of birth: 1936-07-21    CHIEF COMPLAINT:   Right buttock pain    SUBJECTIVE:   HPI:  Pleasant 87 year old female comes to clinic for follow-up of right buttock pain.  She has had pain there for a number of weeks after fall.  She was here 2 weeks ago and had ultrasound-guided right SI joint injection.  She had no relief from this.  She is unfortunately continued to have constant achy pain that is in the middle part of the right buttock.  She has been very diligent about doing her home exercises.  These home exercises improved her pain on the left but unfortunately have not done anything for the right.  She continues to use Tylenol, Voltaren, and heating pad but does not get much relief.  She also occasionally takes a tramadol.  No numbness or tingling down the leg.  ROS:     See HPI  PERTINENT  PMH / PSH FH / / SH:  Past Medical, Surgical, Social, and Family History Reviewed & Updated in the EMR.  Pertinent findings include:  none  OBJECTIVE: BP (!) 146/58   Ht 5' 0.5" (1.537 m)   Wt 116 lb (52.6 kg)   BMI 22.28 kg/m   Physical Exam:  Vital signs are reviewed.  GEN: Alert and oriented, NAD Pulm: Breathing unlabored PSY: normal mood, congruent affect  MSK: R hip -no deformity.  She is tender to palpation at the upper outer right buttock.  There is 1 spot there where she is very point tender.  Comparatively she is only mildly point tender to palpation at the right SI joint, right ischial tuberosity, and posterior aspect of the right greater trochanter.  Range of motion of the hip is full in flexion.  She has 4/5 strength in hip flexion and abduction.  She has negative logroll.  Negative FADIR.  Positive Corky Sox.  Positive straight leg raise.  Neurovascularly intact distally.  ASSESSMENT & PLAN:  1.  Right buttock pain  -Clinically today most of her pain is over the hip abductors and external rotators.  She really does not have much in  terms of bony tenderness at location such as the SI joint or the greater trochanter with ischial tuberosity.  She unfortunately did not have any improvement from the SI joint injection or home exercises despite being diligent with the.  She is just quite globally weak throughout her hip girdle muscles.  I want to get her involved with formal physical therapy at [facility] twice a week for 4 weeks.  She can continue Tylenol, Voltaren, and heat as needed.  Will see her back in 4 weeks time.  If no improvement, I would try shockwave at that area of maximal tenderness.  [name], MD PGY-4, Sports Medicine Fellow [place]  I observed and examined the patient with the [facility] resident and agree with assessment and plan.  Note reviewed and modified by me. [name]

## 2023-01-28 NOTE — Progress Notes (Deleted)
Office Visit Note  Patient: [name]             Date of Birth: [birth year]           MRN: [id number]             PCP: Cassandria Anger, MD Referring: Cassandria Anger, MD Visit Date: 02/11/2023 Occupation: '@GUAROCC'$ @  Subjective:  No chief complaint on file.   History of Present Illness: [name] is a 87 y.o. female ***     Activities of Daily Living:  Patient reports morning stiffness for *** {minute/hour:19697}.   Patient {ACTIONS;DENIES/REPORTS:21021675::"Denies"} nocturnal pain.  Difficulty dressing/grooming: {ACTIONS;DENIES/REPORTS:21021675::"Denies"} Difficulty climbing stairs: {ACTIONS;DENIES/REPORTS:21021675::"Denies"} Difficulty getting out of chair: {ACTIONS;DENIES/REPORTS:21021675::"Denies"} Difficulty using hands for taps, buttons, cutlery, and/or writing: {ACTIONS;DENIES/REPORTS:21021675::"Denies"}  No Rheumatology ROS completed.   PMFS History:  Patient Active Problem List   Diagnosis Date Noted   Leg wound, right, initial encounter 01/17/2023   Pain of right sacroiliac joint 01/14/2023   Lumbar degenerative disc disease 12/21/2022   Chronic left SI joint pain 11/30/2022   Inflammation of sacroiliac joint ([place]) 11/16/2022   Stool incontinence 11/16/2022   Memory impairment 11/08/2022   Grief 10/06/2022   History of basal cell carcinoma 09/29/2022   Open forehead wound 07/21/2022   Stress at home 07/21/2022   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   Gait disorder 02/28/2022   History of malignant neoplasm of skin 02/22/2022   Melanocytic nevi of trunk 02/22/2022   Seborrheic dermatitis 02/22/2022   Urticaria 02/22/2022   Irregular bowel habits 11/18/2021   Scalp laceration, sequela 11/02/2021   Concussion 10/29/2021   Scalp itch 08/19/2021   Allergic rhinitis due to animal (cat) (dog) hair and dander 08/07/2021   Allergic rhinitis due to pollen 08/07/2021   Mild intermittent asthma 08/07/2021   Closed fracture of first cervical  vertebra ([place]) 08/07/2021   Closed fracture of second cervical vertebra ([place]) 08/07/2021   Cervical spine instability 05/15/2021   Closed 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 05/13/2021   Cervical spinal cord compression ([place]) 05/04/2021   Fall against object 03/25/2021   Hematoma of face, initial encounter 03/25/2021   Degenerative disc disease, cervical 03/11/2021   Spondylolisthesis, cervical region 03/11/2021   Trochanteric bursitis of right hip 09/22/2020   Elevated blood-pressure reading, without diagnosis of hypertension 07/14/2020   Meningioma ([place]) 03/25/2020   Arthralgia 03/18/2020   RUQ pain 01/02/2020   Cholelithiasis 09/09/2019   Ear pain, bilateral 03/21/2019   Cervical spondylosis 01/05/2019   Neck pain 12/26/2018   Acute pain of left shoulder 12/26/2018   Anxiety 08/22/2018   Contact dermatitis and eczema 06/14/2018   Constipation 11/11/2016   Low back pain 11/10/2016   Leg abrasion 08/12/2016   Contusion of right knee 08/12/2016   Sinusitis, chronic 08/06/2016   Rash and nonspecific skin eruption 04/20/2016   Aortic valve sclerosis 03/12/2015   Contusion of left chest wall 01/09/2015   Fatigue 09/08/2014   Bladder pain 08/19/2014   Increased frequency of urination 08/19/2014   Urinary urgency 08/19/2014   Postherpetic neuralgia 10/02/2013   Impacted cerumen of right ear 07/16/2013   Headache 07/10/2013   Aortic stenosis 04/19/2013   Insomnia 01/24/2013   Cough 08/07/2012   Upper respiratory infection 08/03/2012   Pruritus of skin 03/11/2012   MVP (mitral valve prolapse)    Multiple thyroid nodules 10/04/2011   Herpes zoster 09/07/2011   Essential hypertension, benign 07/27/2011   Atrophic vaginitis    Well adult exam 03/15/2011  Hyperlipemia, mixed 03/15/2011   OAB (overactive bladder) 09/02/2010   Hypothyroidism 03/03/2010   CALF PAIN, LEFT 10/27/2009   Belching 07/21/2009   EAR PAIN 12/19/2008   Allergic rhinitis  12/19/2008   Irritable bowel syndrome 09/04/2008   PERSONAL HX COLONIC POLYPS 09/04/2008   GASTRITIS, CHRONIC 09/03/2008   DUODENITIS, WITHOUT HEMORRHAGE 09/03/2008   TIBIALIS TENDINITIS 03/20/2008   Osteoarthritis 02/25/2008   SWEATING 02/21/2008   LACTOSE INTOLERANCE 11/20/2007   Dyslipidemia 11/20/2007   GERD 11/20/2007   Osteoporosis 11/20/2007    Past Medical History:  Diagnosis Date   Allergic rhinitis    Asthma    Atrophic vaginitis    Baker's cyst    Left-Dr. Aluisio   Cataract    Dr. Katy Fitch   CHF (congestive heart failure) (HCC)    Colon polyps    Fibroid    Gastritis    chronic   GERD (gastroesophageal reflux disease)    Heart murmur    Hemorrhoids    Hyperlipidemia    Hypothyroidism    IBS (irritable bowel syndrome)    Lumbar spondylosis    MVP (mitral valve prolapse)    Antibiotics required for dental procedures   OA (osteoarthritis)    Osteoporosis 06/2018   T score -2.2 stable on Prolia   Overactive bladder    Sacroiliitis (HCC)    Thyroid disease    Thyroid nodule    Torn meniscus    bilateral    Family History  Problem Relation Age of Onset   Heart disease Mother    Hypertension Mother    Osteoporosis Mother    Congestive Heart Failure Mother    Pancreatic cancer Father    Heart attack Brother    Drug abuse Brother        over dose    Healthy Son    Healthy Son    Colon cancer Neg Hx    Colon polyps Neg Hx    Rectal cancer Neg Hx    Stomach cancer Neg Hx    Past Surgical History:  Procedure Laterality Date   CATARACT EXTRACTION, BILATERAL     EYE SURGERY     POSTERIOR CERVICAL FUSION/FORAMINOTOMY N/A 05/15/2021   Procedure: Cervical One Laminectomy with resection of cyst, Fixation from Occiput to Cervical Four;  Surgeon: Erline Levine, MD;  Location: [place];  Service: Neurosurgery;  Laterality: N/A;   TONSILLECTOMY     Social History   Social History Narrative   Regular exercise-yesDaily caffeine use   Right handed   Lives alone    retired   Immunization History  Administered Date(s) Administered   COVID-19, mRNA, vaccine(Comirnaty)12 years and older 09/29/2022   Fluad Quad(high Dose 65+) 08/04/2019, 09/16/2021, 09/12/2022   Influenza Whole 09/29/2007, 08/21/2008, 09/02/2010, 07/30/2012   Influenza, High Dose Seasonal PF 09/03/2013, 02/19/2015, 11/12/2015, 08/06/2016, 11/11/2016, 08/20/2017, 08/31/2017, 09/20/2018, 09/12/2019   Influenza,inj,Quad PF,6+ Mos 12/17/2014, 07/30/2015   PFIZER(Purple Top)SARS-COV-2 Vaccination 12/12/2019, 12/31/2019, 07/29/2020, 07/29/2020, 07/31/2020   Pfizer Covid-19 Vaccine Bivalent Booster 58yr & up 08/21/2021   Pneumococcal Conjugate-13 01/09/2014   Pneumococcal Polysaccharide-23 09/26/2006, 08/12/2015, 11/11/2016, 08/31/2017, 09/10/2020, 09/10/2021   Td 04/14/2010   Tdap 06/18/2020   Typhoid Inactivated 02/23/2013   Zoster, Live 12/09/2006     Objective: Vital Signs: There were no vitals taken for this visit.   Physical Exam   Musculoskeletal Exam: ***  CDAI Exam: CDAI Score: -- Patient Global: --; Provider Global: -- Swollen: --; Tender: -- Joint Exam 02/11/2023   No joint exam has been documented  for this visit   There is currently no information documented on the homunculus. Go to the Rheumatology activity and complete the homunculus joint exam.  Investigation: No additional findings.  Imaging: No results found.  Recent Labs: Lab Results  Component Value Date   WBC 6.3 12/29/2022   HGB 12.7 12/29/2022   PLT 324.0 12/29/2022   NA 136 12/29/2022   K 4.2 12/29/2022   CL 101 12/29/2022   CO2 27 12/29/2022   GLUCOSE 87 12/29/2022   BUN 12 12/29/2022   CREATININE 0.70 12/29/2022   BILITOT 0.5 12/29/2022   ALKPHOS 38 (L) 12/29/2022   AST 20 12/29/2022   ALT 18 12/29/2022   PROT 7.4 12/29/2022   ALBUMIN 4.4 12/29/2022   CALCIUM 9.4 12/29/2022   GFRAA 87 07/17/2020    Speciality Comments: No specialty comments available.  Procedures:  No  procedures performed Allergies: Bee venom, Bacitracin-polymyxin b, Clarithromycin, Doxycycline, Neosporin [neomycin-bacitracin zn-polymyx], and Hydrocortisone   Assessment / Plan:     Visit Diagnoses: No diagnosis found.  Orders: No orders of the defined types were placed in this encounter.  No orders of the defined types were placed in this encounter.   Face-to-face time spent with patient was *** minutes. Greater than 50% of time was spent in counseling and coordination of care.  Follow-Up Instructions: No follow-ups on file.   Bo Merino, MD  Note - This record has been created using Editor, commissioning.  Chart creation errors have been sought, but may not always  have been located. Such creation errors do not reflect on  the standard of medical care.

## 2023-01-31 ENCOUNTER — Telehealth: Payer: Self-pay

## 2023-01-31 DIAGNOSIS — M25551 Pain in right hip: Secondary | ICD-10-CM

## 2023-01-31 NOTE — Telephone Encounter (Signed)
Discussed with pt that PT will call her after they've reviewed the referral, hopefully today. She has been taking some tramadol she had previously but is down to 1 pill. She will ask her PCP for refills but will call us if need be. She's also asking about getting an xray as her pain is worse over the last few days and now going down her right thigh.  Xray order placed. Pt will go at her earliest convenience.

## 2023-02-01 ENCOUNTER — Ambulatory Visit (INDEPENDENT_AMBULATORY_CARE_PROVIDER_SITE_OTHER): Payer: Medicare Other | Admitting: Sports Medicine

## 2023-02-01 ENCOUNTER — Ambulatory Visit
Admission: RE | Admit: 2023-02-01 | Discharge: 2023-02-01 | Disposition: A | Discharge status: Home / Self Care | Payer: Medicare Other | Source: Ambulatory Visit | Attending: Sports Medicine | Admitting: Sports Medicine

## 2023-02-01 VITALS — BP 132/67 | Ht 60.5 in | Wt 116.0 lb

## 2023-02-01 DIAGNOSIS — M25551 Pain in right hip: Secondary | ICD-10-CM | POA: Diagnosis not present

## 2023-02-01 DIAGNOSIS — M545 Low back pain, unspecified: Secondary | ICD-10-CM | POA: Diagnosis not present

## 2023-02-01 DIAGNOSIS — G8929 Other chronic pain: Secondary | ICD-10-CM

## 2023-02-01 MED ORDER — TRAMADOL HCL 50 MG PO TABS: 50.0000 mg | ORAL_TABLET | Freq: Four times a day (QID) | ORAL | 0 refills | Status: DC | PRN

## 2023-02-01 NOTE — Progress Notes (Addendum)
Established Patient Office Visit  Subjective   Patient ID: [name], female    DOB: April 30, 1936  Age: 87 y.o. MRN: [id number]  Back and leg pain.  Ms. Krahl is here today with chief complaint of right-sided low back pain that is now radiating down her leg past her knee into her foot.  She was seen on Thursday and was thought to benefit from formal physical therapy however she has not yet heard back from therapy location and is here today requesting some pain relief.  Her primary care provider is out on vacation this week.  She has recently been taking Tylenol, using Voltaren gel in which she had left have some tramadol from her previous surgery.  She is getting relief with these modalities.  She reports she fell over a box a couple week of landed on her knees which started her right-sided low back pain.  The pain that radiates down her leg somewhat new, it stays on the outside of her leg.  She denies any weakness or loss of bowel or bladder or numbness and tingling down her leg. When she first presented for her right sided low back pain she had underwent ultrasound-guided SI joint injection which she reports only gave her a couple days of relief and then her pain returned.   ROS as listed above in HPI    Objective:     BP 132/67   Ht 5' 0.5" (1.537 m)   Wt 116 lb (52.6 kg)   BMI 22.28 kg/m   Physical Exam Vitals reviewed.  Constitutional:      General: She is not in acute distress.    Appearance: Normal appearance. She is normal weight. She is not ill-appearing, toxic-appearing or diaphoretic.  Pulmonary:     Effort: Pulmonary effort is normal.  Neurological:     Mental Status: She is alert.   Low back: No obvious deformity or asymmetry.  No ecchymosis or edema.  No tenderness to palpation of the midline lumbar spine.  Some tenderness to palpation of the paraspinals and gluteal muscles.  Some slight tenderness to palpation over greater trochanter and iliotibial band.  Good range  of motion at the hip knee and ankle.  Strength 5/5 resisted hip flexion, knee flexion and ankle plantarflexion and dorsiflexion.  Normal gait.  Negative seated straight leg raise bilaterally.    Assessment & Plan:   Problem List Items Addressed This Visit       Other   Low back pain - Primary    Patient is here with likely exacerbation of some arthritic changes in her low back.  Patient is requesting refill of tramadol until she can begin her therapy.  I have sent for her today tramadol 50 mg 20 tablets.  Counseled her to take pill once a day as needed.  She verbalized understanding.  She may continue with her Tylenol and Voltaren regimen.  Hopeful that once she starts physical therapy she will get some relief of her pain.  I would recommend following up with Dr. Oneida Alar about 3 weeks after she has started physical therapy.      Relevant Medications   traMADol (ULTRAM) 50 MG tablet    Return in about 4 weeks (around 03/01/2023).    Elmore Guise, DO  Patient seen and evaluated with the sports medicine fellow.  I agree with the above plan of care.  Addendum: February 04, 2023 X-rays reviewed.  Nothing acute seen in the lumbar spine or hip.  Multilevel degenerative changes seen in the spine which were seen previously on x-ray and MRI.  Also degenerative changes of the right hip.  Proceed with physical therapy and follow-up with Dr. Oneida Alar approximate 3 weeks after starting PT.

## 2023-02-01 NOTE — Assessment & Plan Note (Signed)
Patient is here with likely exacerbation of some arthritic changes in her low back.  Patient is requesting refill of tramadol until she can begin her therapy.  I have sent for her today tramadol 50 mg 20 tablets.  Counseled her to take pill once a day as needed.  She verbalized understanding.  She may continue with her Tylenol and Voltaren regimen.  Hopeful that once she starts physical therapy she will get some relief of her pain.  I would recommend following up with Dr. Oneida Alar about 3 weeks after she has started physical therapy.

## 2023-02-02 ENCOUNTER — Encounter (HOSPITAL_BASED_OUTPATIENT_CLINIC_OR_DEPARTMENT_OTHER): Payer: Medicare Other | Attending: General Surgery | Admitting: Internal Medicine

## 2023-02-02 DIAGNOSIS — S80811D Abrasion, right lower leg, subsequent encounter: Secondary | ICD-10-CM | POA: Diagnosis not present

## 2023-02-02 DIAGNOSIS — J45909 Unspecified asthma, uncomplicated: Secondary | ICD-10-CM | POA: Diagnosis not present

## 2023-02-02 DIAGNOSIS — I110 Hypertensive heart disease with heart failure: Secondary | ICD-10-CM | POA: Diagnosis not present

## 2023-02-02 DIAGNOSIS — M1990 Unspecified osteoarthritis, unspecified site: Secondary | ICD-10-CM | POA: Insufficient documentation

## 2023-02-02 DIAGNOSIS — X58XXXD Exposure to other specified factors, subsequent encounter: Secondary | ICD-10-CM | POA: Diagnosis not present

## 2023-02-02 DIAGNOSIS — L97811 Non-pressure chronic ulcer of other part of right lower leg limited to breakdown of skin: Secondary | ICD-10-CM | POA: Insufficient documentation

## 2023-02-02 DIAGNOSIS — I509 Heart failure, unspecified: Secondary | ICD-10-CM | POA: Insufficient documentation

## 2023-02-02 DIAGNOSIS — M545 Low back pain, unspecified: Secondary | ICD-10-CM | POA: Diagnosis not present

## 2023-02-02 DIAGNOSIS — M810 Age-related osteoporosis without current pathological fracture: Secondary | ICD-10-CM | POA: Diagnosis not present

## 2023-02-02 DIAGNOSIS — S81811A Laceration without foreign body, right lower leg, initial encounter: Secondary | ICD-10-CM | POA: Diagnosis not present

## 2023-02-02 DIAGNOSIS — I87311 Chronic venous hypertension (idiopathic) with ulcer of right lower extremity: Secondary | ICD-10-CM | POA: Diagnosis not present

## 2023-02-02 DIAGNOSIS — G8929 Other chronic pain: Secondary | ICD-10-CM | POA: Insufficient documentation

## 2023-02-03 ENCOUNTER — Other Ambulatory Visit: Payer: Self-pay

## 2023-02-03 DIAGNOSIS — M25551 Pain in right hip: Secondary | ICD-10-CM

## 2023-02-03 DIAGNOSIS — M533 Sacrococcygeal disorders, not elsewhere classified: Secondary | ICD-10-CM

## 2023-02-03 NOTE — Progress Notes (Signed)
Pt called asking for a PT referral faxed to [facility]. States [facility] PT can't get her in until 4/2.   Referral faxed.

## 2023-02-05 NOTE — Progress Notes (Signed)
DARCI, DENARO ([id number]) [id number] Page 1 of 8 Visit Report for 02/02/2023 Chief Complaint Document Details Patient Name: Date of Service: [name], [name] 02/02/2023 8:00 A M Medical Record Number: [id number] Patient Account Number: [id number] Date of Birth/Sex: Treating RN: 1936/11/04 (87 y.o. F) Primary Care Provider: Cassandria Anger Other Clinician: Referring Provider: Treating Provider/Extender: Thayer Headings in Treatment: 0 Information Obtained from: Patient Chief Complaint 02/03/2023; patient is here from the [facility] with an abrasion injury on her right lower leg Electronic Signature(s) Signed: 02/02/2023 4:27:04 PM By: Linton Ham MD Entered By: Linton Ham on 02/02/2023 08:58:06 -------------------------------------------------------------------------------- Debridement Details Patient Name: Date of Service: [name], [name] 02/02/2023 8:00 A M Medical Record Number: [id number] Patient Account Number: [id number] Date of Birth/Sex: Treating RN: [birth year] (87 y.o. [name] Primary Care Provider: Cassandria Anger Other Clinician: Referring Provider: Treating Provider/Extender: Thayer Headings in Treatment: 0 Debridement Performed for Assessment: Wound #1 Right,Anterior Lower Leg Performed By: Clinician Sharyn Creamer, RN Debridement Type: Chemical/Enzymatic/Mechanical Agent Used: wound cleanser and gauze Level of Consciousness (Pre-procedure): Awake and Alert Pre-procedure Verification/Time Out No Taken: Bleeding: None Response to Treatment: Procedure was tolerated well Level of Consciousness (Post- Awake and Alert procedure): Post Debridement Measurements of Total Wound Length: (cm) 6.3 Width: (cm) 6 Depth: (cm) 0.1 Volume: (cm) 2.969 Character of Wound/Ulcer Post Debridement: Stable Post Procedure Diagnosis Same as  Pre-procedure Electronic Signature(s) Signed: 02/03/2023 5:08:37 PM By: Sharyn Creamer RN, BSN Signed: 02/04/2023 2:38:35 AM By: Linton Ham MD Entered By: Sharyn Creamer on 02/03/2023 16:41:50 -------------------------------------------------------------------------------- HPI Details Patient Name: Date of Service: [name], [name] 02/02/2023 8:00 A M Medical Record Number: [id number] Patient Account Number: [id number] Date of Birth/Sex: Treating RN: 06/11/1936 (87 y.o. F) Primary Care Provider: Cassandria Anger Other Clinician: Referring Provider: Treating Provider/Extender: Thayer Headings in Treatment: [address] [name], [name] ([id number]) [id number] Page 2 of 8 History of Present Illness HPI Description: ADMISSION 02/02/23 [name] is an 87 year old woman who lives in the independent [facility] the [facility]. She has been dealing with a leg wound since February 19 when she traumatized her right leg on a cleaning bucket. She has been using Bactroban and T and an Ace wrap. elfa Chronic low back pain, right hip pain, osteoporosis, hypertension, gait disease disorder she is not a diabetic. Electronic Signature(s) Signed: 02/02/2023 4:27:04 PM By: Linton Ham MD Entered By: Linton Ham on 02/02/2023 09:00:29 -------------------------------------------------------------------------------- Physical Exam Details Patient Name: Date of Service: [name], [name] 02/02/2023 8:00 A M Medical Record Number: [id number] Patient Account Number: [id number] Date of Birth/Sex: Treating RN: 1936-02-15 (87 y.o. F) Primary Care Provider: Cassandria Anger Other Clinician: Referring Provider: Treating Provider/Extender: Thayer Headings in Treatment: 0 Constitutional Sitting or standing Blood Pressure is within target range for patient.. Pulse regular and within target range for patient.[name]  Respirations regular, non-labored and within target range.. Temperature is normal and within the target range for the patient.[name] Appears in no distress. Cardiovascular Pedal pulses are palpable. Significant hemosiderin staining of both lower legs. Not much edema but her skin is very fragile. Notes Wound exam; the areas on the right anterior lower leg. There is a lateral and a medial part of this. Under illumination it does not look bad however I am going to try Hydrofera Blue border foam and change every 2 days. There is no evidence of infection the patient  has been changing the dressing herself Electronic Signature(s) Signed: 02/02/2023 4:27:04 PM By: Linton Ham MD Entered By: Linton Ham on 02/02/2023 09:02:26 -------------------------------------------------------------------------------- Physician Orders Details Patient Name: Date of Service: [name], [name] 02/02/2023 8:00 A M Medical Record Number: [id number] Patient Account Number: [id number] Date of Birth/Sex: Treating RN: February 16, 1936 (87 y.o. [name] Primary Care Provider: Cassandria Anger Other Clinician: Referring Provider: Treating Provider/Extender: Thayer Headings in Treatment: 0 Verbal / Phone Orders: No Diagnosis Coding Follow-up Appointments ppointment in 1 week. - Dr Dellia Nims - Wednesday Return A Other: - [facility]/ Shower/ Hygiene May shower and wash wound with soap and water. Edema Control - Lymphedema / SCD / Other Bilateral Lower Extremities Avoid standing for long periods of time. Patient to wear own compression stockings every day. - pt to order from [facility]. Exercise regularly Wound Treatment Wound #1 - Lower Leg Wound Laterality: Right, Anterior Cleanser: Soap and Water Every Other Day/15 Days Discharge Instructions: May shower and wash wound with dial antibacterial soap and water prior to dressing change. [name] ([id number])  [id number] Page 3 of 8 Peri-Wound Care: Skin Prep (DME) (Generic) Every Other Day/15 Days Discharge Instructions: Use skin prep as directed Prim Dressing: Hydrofera Blue Ready Transfer Foam, 4x5 (in/in) (DME) (Generic) Every Other Day/15 Days ary Discharge Instructions: Apply to wound bed as instructed Secondary Dressing: ALLEVYN Gentle Border, 5x5 (in/in) (DME) (Generic) Every Other Day/15 Days Discharge Instructions: Apply over primary dressing as directed. Patient Medications llergies: codeine, Prozac A Notifications Medication Indication Start End prior to debridement 02/02/2023 lidocaine DOSE topical 5 % ointment - ointment topical once daily Electronic Signature(s) Signed: 02/02/2023 4:27:04 PM By: Linton Ham MD Signed: 02/03/2023 5:08:37 PM By: Sharyn Creamer RN, BSN Entered By: Sharyn Creamer on 02/02/2023 09:13:20 -------------------------------------------------------------------------------- Problem List Details Patient Name: Date of Service: [name], [name] 02/02/2023 8:00 A M Medical Record Number: [id number] Patient Account Number: [id number] Date of Birth/Sex: Treating RN: 1936-03-19 (87 y.o. F) Primary Care Provider: Cassandria Anger Other Clinician: Referring Provider: Treating Provider/Extender: Thayer Headings in Treatment: 0 Active Problems ICD-10 Encounter Code Description Active Date MDM Diagnosis L97.811 Non-pressure chronic ulcer of other part of right lower leg limited to breakdown 02/02/2023 No Yes of skin S80.811D Abrasion, right lower leg, subsequent encounter 02/02/2023 No Yes I87.311 Chronic venous hypertension (idiopathic) with ulcer of right lower extremity 02/02/2023 No Yes Inactive Problems Resolved Problems Electronic Signature(s) Signed: 02/02/2023 4:27:04 PM By: Linton Ham MD Entered By: Linton Ham on 02/02/2023  08:57:08 -------------------------------------------------------------------------------- Progress Note Details Patient Name: Date of Service: [name], [name] 02/02/2023 8:00 A M Medical Record Number: [id number] Patient Account Number: [id number] Date of Birth/Sex: Treating RN: 25-Aug-1936 (87 y.o. F) Primary Care Provider: Cassandria Anger Other Clinician: RICA, [name] ([id number]) [id number] Page 4 of 8 Referring Provider: Treating Provider/Extender: Blenda Nicely, Vivi Ferns in Treatment: 0 Subjective Chief Complaint Information obtained from Patient 02/03/2023; patient is here from the [facility] with an abrasion injury on her right lower leg History of Present Illness (HPI) ADMISSION 02/02/23 Mrs. Light is an 87 year old woman who lives in the independent part of the [facility]. She has been dealing with a leg wound since February 19 when she traumatized her right leg on a cleaning bucket. She has been using Bactroban and T and an Ace wrap. elfa Chronic low back pain, right hip pain, osteoporosis, hypertension, gait disease disorder she is  not a diabetic. Patient History Allergies Prozac (Reaction: aggressive behavior), codeine (Severity: Mild, Reaction: N/V) Family History Unknown History. Social History Former smoker, Marital Status - Widowed, Alcohol Use - Rarely, Drug Use - No History, Caffeine Use - Daily. Medical History Eyes Patient has history of Cataracts - removed Respiratory Patient has history of Asthma Cardiovascular Patient has history of Congestive Heart Failure Musculoskeletal Patient has history of Osteoarthritis Medical A Surgical History Notes nd Ear/Nose/Mouth/Throat seasonal allergies Cardiovascular heart murmur; hyperlipidemia; mitral valve prolapse Gastrointestinal GERD; IBS Review of Systems (ROS) Constitutional Symptoms (General Health) Denies  complaints or symptoms of Fatigue, Fever, Chills, Marked Weight Change. Eyes Complains or has symptoms of Glasses / Contacts. Ear/Nose/Mouth/Throat Denies complaints or symptoms of Chronic sinus problems or rhinitis. Respiratory Denies complaints or symptoms of Chronic or frequent coughs, Shortness of Breath. Endocrine hypothyroidism Genitourinary Denies complaints or symptoms of Frequent urination. Integumentary (Skin) Complains or has symptoms of Wounds - R lower leg. Musculoskeletal Complains or has symptoms of Muscle Weakness. Neurologic Denies complaints or symptoms of Numbness/parasthesias. Psychiatric Denies complaints or symptoms of Claustrophobia. Objective Constitutional Sitting or standing Blood Pressure is within target range for patient.. Pulse regular and within target range for patient.[name] Respirations regular, non-labored and within target range.. Temperature is normal and within the target range for the patient.[name] Appears in no distress. Vitals Time Taken: 8:08 AM, Temperature: 97.9 F, Pulse: 78 bpm, Respiratory Rate: 18 breaths/min, Blood Pressure: 150/81 mmHg. Cardiovascular Pedal pulses are palpable. Significant hemosiderin staining of both lower legs. Not much edema but her skin is very fragile. [name], [name] ([name]) [id number] Page 5 of 8 General Notes: Wound exam; the areas on the right anterior lower leg. There is a lateral and a medial part of this. Under illumination it does not look bad however I am going to try Hydrofera Blue border foam and change every 2 days. There is no evidence of infection the patient has been changing the dressing herself Integumentary (Hair, Skin) Wound #1 status is Open. Original cause of wound was Skin T ear/Laceration. The date acquired was: 01/10/2023. The wound is located on the Right,Anterior Lower Leg. The wound measures 6.3cm length x 6cm width x 0.1cm depth; 29.688cm^2 area and 2.969cm^3  volume. There is no tunneling or undermining noted. There is a medium amount of serosanguineous drainage noted. There is large (67-100%) red granulation within the wound bed. There is a small (1-33%) amount of necrotic tissue within the wound bed including Adherent Slough. Assessment Active Problems ICD-10 Non-pressure chronic ulcer of other part of right lower leg limited to breakdown of skin Abrasion, right lower leg, subsequent encounter Chronic venous hypertension (idiopathic) with ulcer of right lower extremity Procedures Wound #1 Pre-procedure diagnosis of Wound #1 is an Abrasion located on the Right,Anterior Lower Leg . There was a Chemical/Enzymatic/Mechanical debridement performed by Sharyn Creamer, RN.. Other agent used was wound cleanser and gauze. There was no bleeding. The procedure was tolerated well. Post Debridement Measurements: 6.3cm length x 6cm width x 0.1cm depth; 2.969cm^3 volume. Character of Wound/Ulcer Post Debridement is stable. Post procedure Diagnosis Wound #1: Same as Pre-Procedure Plan Follow-up Appointments: Return Appointment in 1 week. - Dr Dellia Nims - Wednesday Other: - [facility]/ Shower/ Hygiene: May shower and wash wound with soap and water. Edema Control - Lymphedema / SCD / Other: Avoid standing for long periods of time. Patient to wear own compression stockings every day. - pt to order from [facility]. Exercise regularly The following medication(s) was prescribed: lidocaine topical  5 % ointment ointment topical once daily for prior to debridement was prescribed at facility WOUND #1: - Lower Leg Wound Laterality: Right, Anterior Cleanser: Soap and Water Every Other Day/15 Days Discharge Instructions: May shower and wash wound with dial antibacterial soap and water prior to dressing change. Peri-Wound Care: Skin Prep (DME) (Generic) Every Other Day/15 Days Discharge Instructions: Use skin prep as directed Prim Dressing: Hydrofera Blue  Ready Transfer Foam, 4x5 (in/in) (DME) (Generic) Every Other Day/15 Days ary Discharge Instructions: Apply to wound bed as instructed Secondary Dressing: ALLEVYN Gentle Border, 5x5 (in/in) (DME) (Generic) Every Other Day/15 Days Discharge Instructions: Apply over primary dressing as directed. 1. An abrasion in the setting of chronic venous insufficiency on the right anterior lower leg #2 under illumination I am not completely happy with this. I am going to apply [facility], border foam #3 she will change the dressing every second day 4. No debridement was felt to be necessary although if this does not go down in size that may be necessary starting next week 5. On the right anterior knee she had a wound from several years ago. Almost looks like keloid formation. She is saying that this hurts. It does not look infected. I will have another look at it next week Electronic Signature(s) Signed: 02/03/2023 5:08:37 PM By: Sharyn Creamer RN, BSN Signed: 02/04/2023 2:38:35 AM By: Linton Ham MD Previous Signature: 02/02/2023 4:27:04 PM Version By: Linton Ham MD Entered By: Sharyn Creamer on 02/03/2023 16:43:37 [name], [name] ([name]) [id number] Page 6 of 8 -------------------------------------------------------------------------------- HxROS Details Patient Name: Date of Service: [name], [name] LAMONS. 02/02/2023 8:00 A M Medical Record Number: [name] Patient Account Number: [id number] Date of Birth/Sex: Treating RN: 09-Sep-1936 (87 y.o. Benjaman Lobe Primary Care Provider: Cassandria Anger Other Clinician: Referring Provider: Treating Provider/Extender: Thayer Headings in Treatment: 0 Constitutional Symptoms (General Health) Complaints and Symptoms: Negative for: Fatigue; Fever; Chills; Marked Weight Change Eyes Complaints and Symptoms: Positive for: Glasses / Contacts Medical History: Positive for: Cataracts -  removed Ear/Nose/Mouth/Throat Complaints and Symptoms: Negative for: Chronic sinus problems or rhinitis Medical History: Past Medical History Notes: seasonal allergies Respiratory Complaints and Symptoms: Negative for: Chronic or frequent coughs; Shortness of Breath Medical History: Positive for: Asthma Genitourinary Complaints and Symptoms: Negative for: Frequent urination Integumentary (Skin) Complaints and Symptoms: Positive for: Wounds - R lower leg Musculoskeletal Complaints and Symptoms: Positive for: Muscle Weakness Medical History: Positive for: Osteoarthritis Neurologic Complaints and Symptoms: Negative for: Numbness/parasthesias Psychiatric Complaints and Symptoms: Negative for: Claustrophobia Hematologic/Lymphatic Cardiovascular Medical History: Positive for: Congestive Heart Failure Past Medical History Notes: heart murmur; hyperlipidemia; mitral valve prolapse Gastrointestinal RAFAEL, MCDILL ([name]) [id number] Page 7 of 8 Medical History: Past Medical History Notes: GERD; IBS Endocrine Complaints and Symptoms: Review of System Notes: hypothyroidism Immunological Oncologic HBO Extended History Items Eyes: Cataracts Immunizations Pneumococcal Vaccine: Received Pneumococcal Vaccination: Yes Received Pneumococcal Vaccination On or After 60th Birthday: Yes Implantable Devices None Family and Social History Unknown History: Yes; Former smoker; Marital Status - Widowed; Alcohol Use: Rarely; Drug Use: No History; Caffeine Use: Daily; Financial Concerns: No; Food, Clothing or Shelter Needs: No; Support System Lacking: No; Transportation Concerns: No Engineer, maintenance) Signed: 02/02/2023 4:27:04 PM By: Linton Ham MD Signed: 02/03/2023 4:33:02 PM By: Rhae Hammock RN Signed: 02/03/2023 5:08:37 PM By: Sharyn Creamer RN, BSN Entered By: Sharyn Creamer on 02/02/2023  08:16:43 -------------------------------------------------------------------------------- [place] Details Patient Name: Date of Service: Marcus, [name] 02/02/2023 Medical Record Number: [name]  Patient Account Number: [id number] Date of Birth/Sex: Treating RN: 1936/01/30 (87 y.o. F) Primary Care Provider: Cassandria Anger Other Clinician: Referring Provider: Treating Provider/Extender: Thayer Headings in Treatment: 0 Diagnosis Coding ICD-10 Codes Code Description [phone] Non-pressure chronic ulcer of other part of right lower leg limited to breakdown of skin S80.811D Abrasion, right lower leg, subsequent encounter I87.311 Chronic venous hypertension (idiopathic) with ulcer of right lower extremity Facility Procedures : CPT4 Code: [id number] Description: [facility] VISIT-LEV 3 EST PT Modifier: 25 Quantity: 1 : CPT4 Code: [id number] Description: [id number] - DEBRIDE W/O ANES NON SELECT Modifier: Quantity: 1 Physician Procedures : CPT4 Code Description Modifier [id number] WC PHYS LEVEL 3 NEW PT ICD-10 Diagnosis Description L97.811 Non-pressure chronic ulcer of other part of right lower leg limited to breakdown of skin S80.811D Abrasion, right lower leg, subsequent encounter I87.311  Chronic venous hypertension (idiopathic) with ulcer of right lower extremity REMONICA, HAYCOX ([id number]) [id number] P Quantity: 1 age 80 of 10 Electronic Signature(s) Signed: 02/03/2023 5:08:37 PM By: Sharyn Creamer RN, BSN Signed: 02/04/2023 2:38:35 AM By: Linton Ham MD Previous Signature: 02/02/2023 4:27:04 PM Version By: Linton Ham MD Entered By: Sharyn Creamer on 02/03/2023 16:43:53

## 2023-02-05 NOTE — Progress Notes (Signed)
[name], AKARD ([id number]) [id number] Page 1 of 8 Visit Report for 02/02/2023 Allergy List Details Patient Name: Date of Service: [name], GODDESS DERR 02/02/2023 8:00 A M Medical Record Number: [id number] Patient Account Number: [id number] Date of Birth/Sex: Treating RN: 1936/04/13 (87 y.o. [name] Primary Care Tracia Lacomb: Cassandria Anger Other Clinician: Referring Ciella Obi: Treating Monaca Wadas/Extender: Sharma Covert Weeks in Treatment: 0 Allergies Active Allergies Prozac Reaction: aggressive behavior codeine Reaction: N/V Severity: Mild Allergy Notes Electronic Signature(s) Signed: 02/03/2023 5:08:37 PM By: Sharyn Creamer RN, BSN Entered By: Sharyn Creamer on 02/02/2023 08:13:05 -------------------------------------------------------------------------------- Arrival Information Details Patient Name: Date of Service: [name], [name] 02/02/2023 8:00 A M Medical Record Number: [id number] Patient Account Number: [id number] Date of Birth/Sex: Treating RN: 12/20/1935 (87 y.o. Donalda Ewings Primary Care Tiarrah Saville: Cassandria Anger Other Clinician: Referring Tranika Scholler: Treating Robecca Fulgham/Extender: Thayer Headings in Treatment: 0 Visit Information Patient Arrived: Ambulatory Arrival Time: 08:02 Accompanied By: self Transfer Assistance: None Patient Identification Verified: Yes Secondary Verification Process Completed: Yes Patient Requires Transmission-Based Precautions: No Electronic Signature(s) Signed: 02/03/2023 5:08:37 PM By: Sharyn Creamer RN, BSN Entered By: Sharyn Creamer on 02/02/2023 08:11:18 -------------------------------------------------------------------------------- Clinic Level of Care Assessment Details Patient Name: Date of Service: [name], [name] 02/02/2023 8:00 A M Medical Record Number: [id number] Patient Account Number: [id number] Date of Birth/Sex: Treating RN: [birth year] (87  y.o. Donalda Ewings Primary Care Burma Ketcher: Cassandria Anger Other Clinician: Referring Valon Glasscock: Treating Emon Miggins/Extender: Thayer Headings in Treatment: 0 Clinic Level of Care Assessment Items TOOL 4 Quantity Score X- 1 0 Use when only an EandM is performed on FOLLOW-UP visit LEVONDA, GEPFORD ([id number]) [id number] Page 2 of 8 ASSESSMENTS - Nursing Assessment / Reassessment X- 1 10 Reassessment of [name]-morbidities (includes updates in patient status) X- 1 5 Reassessment of Adherence to Treatment Plan ASSESSMENTS - Wound and Skin A ssessment / Reassessment X - Simple Wound Assessment / Reassessment - one wound 1 5 '[]'$  - 0 Complex Wound Assessment / Reassessment - multiple wounds '[]'$  - 0 Dermatologic / Skin Assessment (not related to wound area) ASSESSMENTS - Focused Assessment X- 1 5 Circumferential Edema Measurements - multi extremities '[]'$  - 0 Nutritional Assessment / Counseling / Intervention '[]'$  - 0 Lower Extremity Assessment (monofilament, tuning fork, pulses) '[]'$  - 0 Peripheral Arterial Disease Assessment (using hand held doppler) ASSESSMENTS - Ostomy and/or Continence Assessment and Care '[]'$  - 0 Incontinence Assessment and Management '[]'$  - 0 Ostomy Care Assessment and Management (repouching, etc.) PROCESS - Coordination of Care X - Simple Patient / Family Education for ongoing care 1 15 '[]'$  - 0 Complex (extensive) Patient / Family Education for ongoing care X- 1 10 Staff obtains Programmer, systems, Records, T Results / Process Orders est '[]'$  - 0 Staff telephones HHA, Nursing Homes / Clarify orders / etc '[]'$  - 0 Routine Transfer to another Facility (non-emergent condition) '[]'$  - 0 Routine Hospital Admission (non-emergent condition) X- 1 15 New Admissions / Biomedical engineer / Ordering NPWT Apligraf, etc. , '[]'$  - 0 Emergency Hospital Admission (emergent condition) '[]'$  - 0 Simple Discharge Coordination '[]'$  -  0 Complex (extensive) Discharge Coordination PROCESS - Special Needs '[]'$  - 0 Pediatric / Minor Patient Management '[]'$  - 0 Isolation Patient Management '[]'$  - 0 Hearing / Language / Visual special needs '[]'$  - 0 Assessment of Community assistance (transportation, D/C planning, etc.) '[]'$  - 0 Additional assistance / Altered mentation '[]'$  - 0 Support Surface(s) Assessment (bed, cushion, seat,  etc.) INTERVENTIONS - Wound Cleansing / Measurement X - Simple Wound Cleansing - one wound 1 5 '[]'$  - 0 Complex Wound Cleansing - multiple wounds X- 1 5 Wound Imaging (photographs - any number of wounds) '[]'$  - 0 Wound Tracing (instead of photographs) X- 1 5 Simple Wound Measurement - one wound '[]'$  - 0 Complex Wound Measurement - multiple wounds INTERVENTIONS - Wound Dressings X - Small Wound Dressing one or multiple wounds 1 10 '[]'$  - 0 Medium Wound Dressing one or multiple wounds '[]'$  - 0 Large Wound Dressing one or multiple wounds '[]'$  - 0 Application of Medications - topical MAYBELLINE, HAMBEL ([id number]) [id number] Page 3 of 8 '[]'$  - 0 Application of Medications - injection INTERVENTIONS - Miscellaneous '[]'$  - 0 External ear exam '[]'$  - 0 Specimen Collection (cultures, biopsies, blood, body fluids, etc.) '[]'$  - 0 Specimen(s) / Culture(s) sent or taken to Lab for analysis '[]'$  - 0 Patient Transfer (multiple staff / Harrel Lemon Lift / Similar devices) '[]'$  - 0 Simple Staple / Suture removal (25 or less) '[]'$  - 0 Complex Staple / Suture removal (26 or more) '[]'$  - 0 Hypo / Hyperglycemic Management (close monitor of Blood Glucose) X- 1 15 Ankle / Brachial Index (ABI) - do not check if billed separately X- 1 5 Vital Signs Has the patient been seen at the hospital within the last three years: Yes Total Score: 110 Level Of Care: New/Established - Level 3 Electronic Signature(s) Signed: 02/03/2023 5:08:37 PM By: Sharyn Creamer RN, BSN Entered By: Sharyn Creamer on 02/02/2023  09:14:31 -------------------------------------------------------------------------------- Encounter Discharge Information Details Patient Name: Date of Service: [name], Terria H. 02/02/2023 8:00 A M Medical Record Number: [id number] Patient Account Number: [id number] Date of Birth/Sex: Treating RN: Aug 21, 1936 (87 y.o. Donalda Ewings Primary Care Harding Thomure: Cassandria Anger Other Clinician: Referring Yaritza Leist: Treating Riley Papin/Extender: Thayer Headings in Treatment: 0 Encounter Discharge Information Items Discharge Condition: Stable Ambulatory Status: Ambulatory Discharge Destination: Home Transportation: Private Auto Accompanied By: self Schedule Follow-up Appointment: Yes Clinical Summary of Care: Patient Declined Electronic Signature(s) Signed: 02/03/2023 5:08:37 PM By: Sharyn Creamer RN, BSN Entered By: Sharyn Creamer on 02/02/2023 09:16:10 -------------------------------------------------------------------------------- Lower Extremity Assessment Details Patient Name: Date of Service: [name], [name] 02/02/2023 8:00 A M Medical Record Number: [id number] Patient Account Number: [id number] Date of Birth/Sex: Treating RN: 1936/10/12 (87 y.o. Donalda Ewings Primary Care Reema Chick: Cassandria Anger Other Clinician: Referring Carmellia Kreisler: Treating Jaxsin Bottomley/Extender: Thayer Headings in Treatment: 0 Edema Assessment Assessed: [Left: No] [Right: No] [Left: Edema] [Right: :] Calf Left: Right: Point of Measurement: 28 cm From Medial Instep 31.5 cm Ankle KRISTABELLA, TURPEN ([id number]) [id number] Page 4 of 8 Left: Right: Point of Measurement: 10 cm From Medial Instep 19.2 cm Knee To Floor Left: Right: From Medial Instep 40 cm Vascular Assessment Pulses: Dorsalis Pedis Palpable: [Right:Yes] Blood Pressure: Brachial: [Right:150] Ankle: [Right:Dorsalis Pedis: 174 1.16] Electronic  Signature(s) Signed: 02/03/2023 5:08:37 PM By: Sharyn Creamer RN, BSN Entered By: Sharyn Creamer on 02/02/2023 08:25:03 -------------------------------------------------------------------------------- Multi Wound Chart Details Patient Name: Date of Service: Lesslie. 02/02/2023 8:00 A M Medical Record Number: [id number] Patient Account Number: [id number] Date of Birth/Sex: Treating RN: 18-Sep-1936 (87 y.o. F) Primary Care Declyn Offield: Cassandria Anger Other Clinician: Referring Latamara Melder: Treating Sybel Standish/Extender: Thayer Headings in Treatment: 0 Vital Signs Height(in): Pulse(bpm): 28 Weight(lbs): Blood Pressure(mmHg): 150/81 Body Mass Index(BMI): Temperature(F): 97.9 Respiratory Rate(breaths/min): 18 [1:Photos:] [N/A:N/A] Right, Anterior Lower Leg N/A  N/A Wound Location: Skin Tear/Laceration N/A N/A Wounding Event: Abrasion N/A N/A Primary Etiology: Cataracts, Asthma, Congestive Heart N/A N/A Comorbid History: Failure, Osteoarthritis 01/10/2023 N/A N/A Date Acquired: 0 N/A N/A Weeks of Treatment: Open N/A N/A Wound Status: No N/A N/A Wound Recurrence: 6.3x6x0.1 N/A N/A Measurements L x W x D (cm) 29.688 N/A N/A A (cm) : rea 2.969 N/A N/A Volume (cm) : Full Thickness Without Exposed N/A N/A Classification: Support Structures Medium N/A N/A Exudate A mount: Serosanguineous N/A N/A Exudate Type: red, brown N/A N/A Exudate Color: Large (67-100%) N/A N/A Granulation A mount: Red N/A N/A Granulation Quality: Small (1-33%) N/A N/A Necrotic A mount: None N/A N/A Epithelialization: Treatment Notes FLESHIA, FRATANGELO ([id number]) [id number] Page 5 of 8 Electronic Signature(s) Signed: 02/02/2023 4:27:04 PM By: Linton Ham MD Entered By: Linton Ham on 02/02/2023 08:57:21 -------------------------------------------------------------------------------- Multi-Disciplinary Care Plan Details Patient  Name: Date of Service: [name], Cyrena H. 02/02/2023 8:00 A M Medical Record Number: [id number] Patient Account Number: [id number] Date of Birth/Sex: Treating RN: [birth year] (87 y.o. Donalda Ewings Primary Care Tyanna Hach: Cassandria Anger Other Clinician: Referring Burnis Kaser: Treating Jettie Lazare/Extender: Thayer Headings in Treatment: 0 Active Inactive Orientation to the Wound Care Program Nursing Diagnoses: Knowledge deficit related to the wound healing center program Goals: Patient/caregiver will verbalize understanding of the [place] Program Date Initiated: 02/02/2023 Target Resolution Date: 03/02/2023 Goal Status: Active Interventions: Provide education on orientation to the wound center Notes: Wound/Skin Impairment Nursing Diagnoses: Impaired tissue integrity Knowledge deficit related to ulceration/compromised skin integrity Goals: Patient/caregiver will verbalize understanding of skin care regimen Date Initiated: 02/02/2023 Target Resolution Date: 03/02/2023 Goal Status: Active Ulcer/skin breakdown will have a volume reduction of 30% by week 4 Date Initiated: 02/02/2023 Target Resolution Date: 03/02/2023 Goal Status: Active Interventions: Assess patient/caregiver ability to obtain necessary supplies Assess patient/caregiver ability to perform ulcer/skin care regimen upon admission and as needed Assess ulceration(s) every visit Provide education on ulcer and skin care Notes: Electronic Signature(s) Signed: 02/03/2023 5:08:37 PM By: Sharyn Creamer RN, BSN Entered By: Sharyn Creamer on 02/02/2023 08:45:54 -------------------------------------------------------------------------------- Pain Assessment Details Patient Name: Date of Service: [name], [name] 02/02/2023 8:00 A M Medical Record Number: [id number] Patient Account Number: [id number] Date of Birth/Sex: Treating RN: [birth year] (87 y.o. Donalda Ewings Primary Care Kynzley Dowson: Cassandria Anger Other Clinician: Referring Nevah Dalal: Treating Bluma Buresh/Extender: Thayer Headings in Treatment: [address] VICOTRIA, SEDWICK ([id number]) [id number] Page 6 of 8 Active Problems Location of Pain Severity and Description of Pain Patient Has Paino No Site Locations Pain Management and Medication Current Pain Management: Electronic Signature(s) Signed: 02/03/2023 5:08:37 PM By: Sharyn Creamer RN, BSN Entered By: Sharyn Creamer on 02/02/2023 08:31:43 -------------------------------------------------------------------------------- Patient/Caregiver Education Details Patient Name: Date of Service: [name], [name] 3/6/2024andnbsp8:00 A M Medical Record Number: [id number] Patient Account Number: [id number] Date of Birth/Gender: Treating RN: 10/25/1936 (87 y.o. Donalda Ewings Primary Care Physician: Cassandria Anger Other Clinician: Referring Physician: Treating Physician/Extender: Thayer Headings in Treatment: 0 Education Assessment Education Provided To: Patient Education Topics Provided Wound/Skin Impairment: Methods: Explain/Verbal Responses: State content correctly Electronic Signature(s) Signed: 02/03/2023 5:08:37 PM By: Sharyn Creamer RN, BSN Entered By: Sharyn Creamer on 02/02/2023 08:46:33 -------------------------------------------------------------------------------- Wound Assessment Details Patient Name: Date of Service: [name], [name] 02/02/2023 8:00 A M Medical Record Number: [id number] Patient Account Number: [id number] Date of Birth/Sex: Treating RN: 1936/05/09 (87 y.o. Donalda Ewings Primary Care Charlise Giovanetti: Lew Dawes  V Other Clinician: Referring Saron Tweed: Treating Tatum Massman/Extender: Thayer Headings in Treatment: 0 Wound Status SOPHYA, MORAL ([id number]) [id number] Page 7 of 8 Wound Number: 1 Primary Etiology:  Abrasion Wound Location: Right, Anterior Lower Leg Wound Status: Open Wounding Event: Skin Tear/Laceration Comorbid Cataracts, Asthma, Congestive Heart Failure, History: Osteoarthritis Date Acquired: 01/10/2023 Weeks Of Treatment: 0 Clustered Wound: No Photos Wound Measurements Length: (cm) 6.3 Width: (cm) 6 Depth: (cm) 0.1 Area: (cm) 29.688 Volume: (cm) 2.969 % Reduction in Area: % Reduction in Volume: Epithelialization: None Tunneling: No Undermining: No Wound Description Classification: Full Thickness Without Exposed Support Structures Exudate Amount: Medium Exudate Type: Serosanguineous Exudate Color: red, brown Foul Odor After Cleansing: No Slough/Fibrino Yes Wound Bed Granulation Amount: Large (67-100%) Granulation Quality: Red Necrotic Amount: Small (1-33%) Necrotic Quality: Adherent Slough Periwound Skin Texture Texture Color No Abnormalities Noted: No No Abnormalities Noted: No Moisture No Abnormalities Noted: No Treatment Notes Wound #1 (Lower Leg) Wound Laterality: Right, Anterior Cleanser Soap and Water Discharge Instruction: May shower and wash wound with dial antibacterial soap and water prior to dressing change. Peri-Wound Care Skin Prep Discharge Instruction: Use skin prep as directed Topical Primary Dressing Hydrofera Blue Ready Transfer Foam, 4x5 (in/in) Discharge Instruction: Apply to wound bed as instructed Secondary Dressing ALLEVYN Gentle Border, 5x5 (in/in) Discharge Instruction: Apply over primary dressing as directed. Secured With [employer] Compression Stockings ANNESIA, AILLS ([id number]) [id number] Page 8 of 8 Add-Ons Electronic Signature(s) Signed: 02/03/2023 5:08:37 PM By: Sharyn Creamer RN, BSN Entered By: Sharyn Creamer on 02/02/2023 08:29:41 -------------------------------------------------------------------------------- [place] Details Patient Name: Date of Service: [name], [name]  02/02/2023 8:00 A M Medical Record Number: [id number] Patient Account Number: [id number] Date of Birth/Sex: Treating RN: 17-Nov-1936 (87 y.o. Donalda Ewings Primary Care Amber Guthridge: Cassandria Anger Other Clinician: Referring Nyesha Cliff: Treating Abrey Bradway/Extender: Thayer Headings in Treatment: 0 Vital Signs Time Taken: 08:08 Temperature (F): 97.9 Pulse (bpm): 78 Respiratory Rate (breaths/min): 18 Blood Pressure (mmHg): 150/81 Reference Range: 80 - 120 mg / dl Electronic Signature(s) Signed: 02/03/2023 5:08:37 PM By: Sharyn Creamer RN, BSN Entered By: Sharyn Creamer on 02/02/2023 08:11:47

## 2023-02-05 NOTE — Progress Notes (Signed)
[name] ([id number]) [phone] [id number] Page 1 of 4 Visit Report for 02/02/2023 Abuse Risk Screen Details Patient Name: Date of Service: [name], [name] LA. 02/02/2023 8:00 A M Medical Record Number: [id number] Patient Account Number: [id number] Date of Birth/Sex: Treating RN: 20-Jan-1936 (87 y.o. [name] Primary Care Hailly Fess: Cassandria Anger Other Clinician: Referring Itzel Lowrimore: Treating Lolamae Voisin/Extender: Thayer Headings in Treatment: 0 Abuse Risk Screen Items Answer ABUSE RISK SCREEN: Has anyone close to you tried to hurt or harm you recentlyo No Do you feel uncomfortable with anyone in your familyo No Has anyone forced you do things that you didnt want to doo No Electronic Signature(s) Signed: 02/03/2023 5:08:37 PM By: Sharyn Creamer RN, BSN Entered By: Sharyn Creamer on 02/02/2023 08:16:51 -------------------------------------------------------------------------------- Activities of Daily Living Details Patient Name: Date of Service: [name], [name] NORWICK. 02/02/2023 8:00 A M Medical Record Number: [id number] Patient Account Number: [id number] Date of Birth/Sex: Treating RN: 1936/05/12 (87 y.o. [name] Primary Care Rania Prothero: Cassandria Anger Other Clinician: Referring Channelle Bottger: Treating Teigan Manner/Extender: Thayer Headings in Treatment: 0 Activities of Daily Living Items Answer Activities of Daily Living (Please select one for each item) Drive Automobile Not Able T Medications ake Completely Able Use T elephone Completely Able Care for Appearance Completely Able Use T oilet Completely Able Bath / Shower Completely Able Dress Self Completely Able Feed Self Completely Able Walk Completely Able Get In / Out Bed Completely Able Housework Need Assistance Prepare Meals Completely [place] Completely Able Shop for Self Need Assistance Electronic Signature(s) Signed:  02/03/2023 5:08:37 PM By: Sharyn Creamer RN, BSN Entered By: Sharyn Creamer on 02/02/2023 08:17:33 -------------------------------------------------------------------------------- Education Screening Details Patient Name: Date of Service: [name], [name] 02/02/2023 8:00 A M Medical Record Number: [id number] Patient Account Number: [id number] Date of Birth/Sex: Treating RN: 03-30-1936 (87 y.o. [name] Primary Care [name]: Cassandria Anger Other Clinician: Referring [name]: Treating [name]/Extender: Thayer Headings in Treatment: [address] [name], [name] ([id number]) [id number] Page 2 of 4 Learning Preferences/Education Level/Primary Language Learning Preference: Explanation, Demonstration, Printed Material Highest Education Level: College or Above Preferred Language: Diplomatic Services operational officer Language Barrier: No Translator Needed: No Memory Deficit: No Emotional Barrier: No Cultural/Religious Beliefs Affecting Medical Care: No Physical Barrier Impaired Vision: No Impaired Hearing: No Decreased Hand dexterity: No Knowledge/Comprehension Knowledge Level: High Comprehension Level: High Ability to understand written instructions: High Ability to understand verbal instructions: High Motivation Anxiety Level: Calm Cooperation: Cooperative Education Importance: Acknowledges Need Interest in Health Problems: Asks Questions Perception: Coherent Willingness to Engage in Self-Management High Activities: Readiness to Engage in Self-Management High Activities: Electronic Signature(s) Signed: 02/03/2023 5:08:37 PM By: Sharyn Creamer RN, BSN Entered By: Sharyn Creamer on 02/02/2023 08:18:10 -------------------------------------------------------------------------------- Fall Risk Assessment Details Patient Name: Date of Service: [name], [name] 02/02/2023 8:00 A M Medical Record Number: [id number] Patient Account  Number: [id number] Date of Birth/Sex: Treating RN: 1936-01-19 (87 y.o. [name] Primary Care [name]: Cassandria Anger Other Clinician: Referring [name]: Treating [name]/Extender: Thayer Headings in Treatment: 0 Fall Risk Assessment Items Have you had 2 or more falls in the last 12 monthso 0 Yes Have you had any fall that resulted in injury in the last 12 monthso 0 Yes FALLS RISK SCREEN History of falling - immediate or within 3 months 25 Yes Secondary diagnosis (Do you have 2 or more medical diagnoseso) 15 Yes Ambulatory aid None/bed rest/wheelchair/nurse  0 Yes Crutches/cane/walker 0 No Furniture 0 No Intravenous therapy Access/Saline/Heparin Lock 0 No Gait/Transferring Normal/ bed rest/ wheelchair 0 Yes Weak (short steps with or without shuffle, stooped but able to lift head while walking, may seek 0 No support from furniture) Impaired (short steps with shuffle, may have difficulty arising from chair, head down, impaired 0 No balance) Mental Status Oriented to own ability 0 Yes Overestimates or forgets limitations 0 No Risk Level: Medium Risk Score: 40 [name], [name] H ([id number]) [id number] Page 3 of 4 Electronic Signature(s) -------------------------------------------------------------------------------- Foot Assessment Details Patient Name: Date of Service: [name], [name]. 02/02/2023 8:00 A M Medical Record Number: [id number] Patient Account Number: [id number] Date of Birth/Sex: Treating RN: [birth year] (87 y.o. [name] Primary Care Darianny Momon: Cassandria Anger Other Clinician: Referring Ronetta Molla: Treating Tyson Masin/Extender: Thayer Headings in Treatment: 0 Foot Assessment Items Site Locations + = Sensation present, - = Sensation absent, C = Callus, U = Ulcer R = Redness, W = Warmth, M = Maceration, PU = Pre-ulcerative lesion F = Fissure, S = Swelling, D =  Dryness Assessment Right: Left: Other Deformity: No No Prior Foot Ulcer: No No Prior Amputation: No No Charcot Joint: No No Ambulatory Status: Gait: Electronic Signature(s) Signed: 02/03/2023 5:08:37 PM By: Sharyn Creamer RN, BSN Entered By: Sharyn Creamer on 02/02/2023 08:20:37 -------------------------------------------------------------------------------- Nutrition Risk Screening Details Patient Name: Date of Service: [name], Addalie H. 02/02/2023 8:00 A M Medical Record Number: [id number] Patient Account Number: [id number] Date of Birth/Sex: Treating RN: [birth year] (87 y.o. [name] Primary Care Ula Couvillon: Cassandria Anger Other Clinician: Referring Beau Vanduzer: Treating Boykin Baetz/Extender: Thayer Headings in Treatment: 0 Height (in): Weight (lbs): Body Mass Index (BMI): LORELI, BENZINGER ([id number]) [id number] [id number] Page 4 of 4 Nutrition Risk Screening Items Score Screening NUTRITION RISK SCREEN: I have an illness or condition that made me change the kind and/or amount of food I eat 0 No I eat fewer than two meals per day 3 Yes I eat few fruits and vegetables, or milk products 0 No I have three or more drinks of beer, liquor or wine almost every day 0 No I have tooth or mouth problems that make it hard for me to eat 0 No I don't always have enough money to buy the food I need 0 No I eat alone most of the time 0 No I take three or more different prescribed or over-the-counter drugs a day 1 Yes Without wanting to, I have lost or gained 10 pounds in the last six months 0 No I am not always physically able to shop, cook and/or feed myself 0 No Nutrition Protocols Good Risk Protocol Moderate Risk Protocol High Risk Proctocol Risk Level: Moderate Risk Score: 4 Electronic Signature(s) Signed: 02/03/2023 5:08:37 PM By: Sharyn Creamer RN, BSN Entered By: Sharyn Creamer on 02/02/2023 08:19:15

## 2023-02-07 ENCOUNTER — Ambulatory Visit: Payer: Medicare Other

## 2023-02-07 ENCOUNTER — Other Ambulatory Visit: Payer: Self-pay | Admitting: Sports Medicine

## 2023-02-08 ENCOUNTER — Ambulatory Visit
Admission: RE | Admit: 2023-02-08 | Discharge: 2023-02-08 | Disposition: A | Discharge status: Home / Self Care | Payer: Medicare Other | Source: Ambulatory Visit | Attending: Internal Medicine | Admitting: Internal Medicine

## 2023-02-08 DIAGNOSIS — Z1231 Encounter for screening mammogram for malignant neoplasm of breast: Secondary | ICD-10-CM

## 2023-02-08 NOTE — Telephone Encounter (Signed)
Prescription Request  02/08/2023  LOV: Visit date not found  What is the name of the medication or equipment?   traMADol (ULTRAM) 50 MG tablet    Have you contacted your pharmacy to request a refill? Yes   Which pharmacy would you like this sent to?  [place], [place] [address] Phone: [phone] Fax: [phone]    Patient notified that their request is being sent to the clinical staff for review and that they should receive a response within 2 business days.   Please advise at [facility] [phone]

## 2023-02-08 NOTE — Telephone Encounter (Signed)
Ok Thx 

## 2023-02-09 ENCOUNTER — Encounter: Payer: Self-pay | Admitting: Physician Assistant

## 2023-02-09 ENCOUNTER — Encounter (HOSPITAL_BASED_OUTPATIENT_CLINIC_OR_DEPARTMENT_OTHER): Payer: Medicare Other | Admitting: Internal Medicine

## 2023-02-09 ENCOUNTER — Ambulatory Visit (INDEPENDENT_AMBULATORY_CARE_PROVIDER_SITE_OTHER): Payer: Medicare Other | Admitting: Physician Assistant

## 2023-02-09 ENCOUNTER — Other Ambulatory Visit (HOSPITAL_COMMUNITY): Payer: Self-pay

## 2023-02-09 VITALS — BP 142/71 | HR 79 | Resp 20 | Ht 60.5 in

## 2023-02-09 DIAGNOSIS — R413 Other amnesia: Secondary | ICD-10-CM

## 2023-02-09 DIAGNOSIS — S80811D Abrasion, right lower leg, subsequent encounter: Secondary | ICD-10-CM | POA: Diagnosis not present

## 2023-02-09 DIAGNOSIS — L97811 Non-pressure chronic ulcer of other part of right lower leg limited to breakdown of skin: Secondary | ICD-10-CM | POA: Diagnosis not present

## 2023-02-09 DIAGNOSIS — I87311 Chronic venous hypertension (idiopathic) with ulcer of right lower extremity: Secondary | ICD-10-CM | POA: Diagnosis not present

## 2023-02-09 DIAGNOSIS — M810 Age-related osteoporosis without current pathological fracture: Secondary | ICD-10-CM | POA: Diagnosis not present

## 2023-02-09 DIAGNOSIS — G8929 Other chronic pain: Secondary | ICD-10-CM | POA: Diagnosis not present

## 2023-02-09 DIAGNOSIS — M545 Low back pain, unspecified: Secondary | ICD-10-CM | POA: Diagnosis not present

## 2023-02-09 NOTE — Progress Notes (Signed)
Assessment/Plan:   Memory Impairment  [name] is a very pleasant 87 y.o. RH female with  a history of hypertension, h/o posterior L frontal meningioma (16 x 11 by 17 mmm on 03/2021 MRI) , hyperlipidemia, situational depression, hypothyroidism, GERD, IBS, OA,  seen today in follow up for memory loss. Patient is currently on memantine 5 mg bid (did not tolerate donepezil 10 mg daily) . MRI brain personally reviewed was remarkable for a known calcified meningioma on the posterior left frontal lobe measuring 1.7 x 1.6 cm and change since prior MRI in May 2022.  No parenchymal edema was noted.  There is mild chronic small vessel ischemic changes within the cerebral white matter, slightly progressed from prior MRI, as well as mild to moderate generalized cerebral atrophy, which is stable.  She is able to perform most ADLs independently.  Recommendations  Follow up in  6  months. Continue B12 supplements Continue to control cardiovascular risk factors Continue to control mood as per PCP Patient scheduled for Neuropsych evaluation in October 2022 for clarity of the diagnosis and disease trajectory.    Subjective:   Patient is here alone . Previous records as well as any outside records available were reviewed prior to todays visit. Patient was last seen on 11/09/19/2023 at which time her MoCA was 22/30.   Any changes in memory since last visit? Patient has some difficulty remembering recent conversations in a brief period of time, and sometimes she may need more time to remember the name although she is able to produce.  She never forgets appointments or medications.  She enjoys living in the independent living community, has good social support, enjoys playing [place], attending or listening to some visiting speakers repeats oneself? "I don't think so" Disoriented when walking into a room?  Patient denies   Leaving objects in unusual places? She misplaces the phone but not in unusual  places  Wandering behavior?  denies   Any personality changes since last visit?  denies   Any worsening depression? Adjusting to the loss of her husband of 32 years.  She is entertaining seeing a counselor Hallucinations or paranoia?  denies   Seizures?    denies    Any sleep changes?  She sleeps well. She dreams of her husband a couple of times, although she knows that this is a dream and not a hallucination.  She denies REM behavior or sleepwalking   Sleep apnea?   denies   Any hygiene concerns?    denies   Independent of bathing and dressing?  Endorsed  Does the patient needs help with medications? Patient is in charge  Who is in charge of the finances? Patient's sons are on top of the more complex financial tasks, she has her bills on auto pay.    Any changes in appetite?  denies    Patient have trouble swallowing?  denies   Does the patient cook? No Any headaches?   denies   Chronic back pain  denies   Ambulates with difficulty?   She has chronic pain in the setting of arthritis and neuropathy, especially at the right hip which limited her mobility.  She has been doing some exercises, and she is to start physical therapy soon Recent falls or head injuries? On Feb 2024 she hit a bucket with her R leg and sustained a large wound on the shin.  She has now healed Unilateral weakness, numbness or tingling?    denies   Any tremors?  denies   Any anosmia?  Patient denies   Any incontinence of urine?  She reports nocturia at times Any bowel dysfunction?     denies      Patient lives  in [place]  Does the patient drive?No longer drives due to decreased mobility on her neck that limits her driving. She utilizes a transportation provided at the independent living.  Initial evaluation 11/08/2022 How long did patient have memory difficulties? [name] , her daughter told her that for the last 6 months she was "forgetting things 10 minutes later" so now she writes what she  needs to remember in a pad. However, she never forgets meds or appt's, but s he may experience some difficulties with recent conversation someone's names. For a few weeks she was not active, trying to adjust to her loss, but recently" I just started to read again, watch Netflix."    repeats oneself? Denies  Disoriented when walking into a room?  Patient denies   Leaving objects in unusual places? " I am always leaving my phone everywhere" but not in unusual places Ambulates  with difficulty?   She has neuropathy and radiculopathy, so she is "a little wobbly", she was on gabapentin, now on Lyrica. She is doing PT and balance therapy "and it helps"  Recent falls? Recently she had a mechanical fall after tripping over an object and sustaining a head injury with 1 inch laceration above the L eye and some skin tear on the R knee, no LOC, "just a little concussion".  History of seizures?   Patient denies   Wandering behavior?  Patient denies   Patient drives?   Patient no longer drives Any personality changes?  She reports more anxiety, I know he (her husband [name])  is not here, but I talk to him good morning because it is comforting " Any history of depression?: She is experiencing grief depression as her husband of 4 years died on 10/01/23 . She is starting therapy Geneticist, molecular) and will start grief group at [facility] this week.  Hallucinations or paranoia?  Patient denies   She has mild insomnia after her husband death. She falls asleep with the TV on. Denies vivid dreams except one time she dreamt about [name] "it was nice", denies REM behavior or sleepwalking . History of sleep apnea?  Patient denies   Any hygiene concerns?  Patient denies   Independent of bathing and dressing?  Endorsed  Does the patient needs help with medications? Patient is in charge  Who is in charge of the finances?  Patient is in charge   Any changes in appetite?  Patient denies   Patient have trouble swallowing? Patient  denies, I live in [facility], I don't have to Does the patient cook? Any kitchen accidents such as leaving the stove on?  Patient denies   Any headaches?  Patient denies   Double vision? Patient denies   Any focal numbness or tingling? She has chronic paresthesias.  Chronic pain?  She has some R  buttock, RLE pain due to radiculopathy, on LS injections by NS, gabapentin and tramadol prn..  She has a history of C spine fusion as well.  Unilateral weakness?  Patient denies   Any tremors?  Patient denies   Any history of anosmia?  Patient denies   Any incontinence of urine?  She reports nocturia  Any bowel dysfunction?   Patient denies    History of heavy alcohol intake?  Patient denies  History of heavy tobacco use?  Patient denies   Family history of dementia?   Negative for  Alzheimer's disease   Patient lives:  I. L at [facility], her 2 sons live in [place]   Pertinent studies 2 D echo  EF 60-65, mild AI, no sign AS B12 229 (December 2023)  PREVIOUS MEDICATIONS: donepezil 10 mg daily (diarrhea)  CURRENT MEDICATIONS:  Outpatient Encounter Medications as of 02/09/2023  Medication Sig   acetaminophen (TYLENOL) 500 MG tablet Take 1,000 mg by mouth every 6 (six) hours as needed for mild pain.   albuterol (VENTOLIN HFA) 108 (90 Base) MCG/ACT inhaler Take 2 puffs every 4-6 hours as needed   amLODipine (NORVASC) 2.5 MG tablet Take 1 tablet (2.5 mg total) by mouth daily as needed. Take if BP is above 170/100   Azelastine HCl 0.15 % SOLN Place into both nostrils. Place into both nostrils.   Calcium Carbonate-Vitamin D (CALCIUM-VITAMIN D) 500-200 MG-UNIT per tablet Take 3 tablets by mouth daily.   cefUROXime (CEFTIN) 250 MG tablet TAKE ONE TABLET BY MOUTH TWICE DAILY WITH A MEAL   desonide (DESOWEN) 0.05 % cream Apply 1 application topically as needed.   diclofenac Sodium (VOLTAREN) 1 % GEL APPLY 4 GRAMS 4 TIMES DAILY.   EPINEPHrine (EPIPEN 2-PAK) 0.3 mg/0.3 mL IJ SOAJ injection Inject 0.3 mLs  (0.3 mg total) into the muscle as needed.   famotidine (PEPCID) 40 MG tablet Take 1 tablet (40 mg total) by mouth at bedtime. NEEDS OFFICE VISIT FOR ADDITIONAL REFILLS   Gauze Pads & Dressings (TELFA ADHESIVE DRESSING) 3"X4" PADS Use qd   hydrocortisone 2.5 % ointment Apply topically 2 (two) times daily.   ipratropium (ATROVENT) 0.06 % nasal spray Place 1 spray into the nose 3 (three) times daily as needed for rhinitis.   levocetirizine (XYZAL) 5 MG tablet Take 5 mg by mouth every evening.   levothyroxine (SYNTHROID) 50 MCG tablet TAKE ONE TABLET BY MOUTH DAILY BEFORE BREAKFAST   LORazepam (ATIVAN) 1 MG tablet Take 2-3 tablets at bedtime and 1-2 tablets in the daytime as needed.   memantine (NAMENDA) 5 MG tablet Take 1 tablet (5 mg at night) for 2 weeks, then increase to 1 tablet (5 mg) twice a day   montelukast (SINGULAIR) 10 MG tablet Take 1 tablet (10 mg total) by mouth at bedtime.   mupirocin ointment (BACTROBAN) 2 % On leg wound w/dressing change qd   pantoprazole (PROTONIX) 40 MG tablet TAKE ONE TABLET BY MOUTH TWICE DAILY BEFORE MEALS   polyethylene glycol powder (GLYCOLAX/MIRALAX) 17 GM/SCOOP powder Take 17 g by mouth 2 (two) times daily as needed for moderate constipation.   pregabalin (LYRICA) 25 MG capsule take 1 capsule by oral route 2 times every day; start 1 qhsX5 days, then BID   simvastatin (ZOCOR) 40 MG tablet TAKE ONE TABLET ONCE DAILY   traMADol (ULTRAM) 50 MG tablet Take 1 tablet (50 mg total) by mouth every 6 (six) hours as needed for severe pain.   triamcinolone (NASACORT) 55 MCG/ACT AERO nasal inhaler Place 2 sprays into the nose as needed.   VESICARE 10 MG tablet Take 1 tablet (10 mg total) by mouth daily as needed (bladder).   No facility-administered encounter medications on file as of 02/09/2023.       02/10/2017    1:39 PM  MMSE - Mini Mental State Exam  Orientation to time 5  Orientation to Place 5  Registration 3  Attention/ Calculation 5  Recall 2  Language- name 2 objects 2  Language- repeat 1  Language- follow 3 step command 3  Language- read & follow direction 1  Write a sentence 1  Copy design 1  Total score 29      11/08/2022    7:00 PM  Montreal Cognitive Assessment   Visuospatial/ Executive (0/5) 1  Naming (0/3) 1  Attention: Read list of digits (0/2) 2  Attention: Read list of letters (0/1) 1  Attention: Serial 7 subtraction starting at 100 (0/3) 3  Language: Repeat phrase (0/2) 2  Language : Fluency (0/1) 1  Abstraction (0/2) 1  Delayed Recall (0/5) 3  Orientation (0/6) 6  Total 21  Adjusted Score (based on education) 22    Objective:     PHYSICAL EXAMINATION:    VITALS:   Vitals:   02/09/23 0901  BP: (!) 142/71  Pulse: 79  Resp: 20  SpO2: 96%  Height: 5' 0.5" (1.537 m)    GEN:  The patient appears stated age and is in NAD. HEENT:  Normocephalic, atraumatic.   Neurological examination:  General: NAD, well-groomed, appears stated age. Orientation: The patient is alert. Oriented to person, place and date Cranial nerves: There is good facial symmetry.The speech is fluent and clear. No aphasia or dysarthria. Fund of knowledge is appropriate. Recent and remote memory are impaired. Attention and concentration are normal.  Able to name objects and repeat phrases.  Hearing is intact to conversational tone.   Sensation: Sensation is intact to light touch throughout Motor: Strength is at least antigravity x4. DTR's 2/4 in UE/LE     Movement examination: Tone: There is normal tone in the UE/LE Abnormal movements:  no tremor.  No myoclonus.  No asterixis.   Coordination:  There is no decremation with RAM's. Normal finger to nose  Gait and Station: The patient has some difficulty arising out of a deep-seated chair without the use of the hands. The patient's stride length is good.  Gait is cautious and narrow.    Thank you for allowing [place] the opportunity to participate in the care of this nice patient.  Please do not hesitate to contact us for any questions or concerns.   Total time spent on today's visit was 31 minutes dedicated to this patient today, preparing to see patient, examining the patient, ordering tests and/or medications and counseling the patient, documenting clinical information in the EHR or other health record, independently interpreting results and communicating results to the patient/family, discussing treatment and goals, answering patient's questions and coordinating care.  Cc:  Plotnikov, Evie Lacks, MD  Sharene Butters 02/09/2023 11:23 AM

## 2023-02-09 NOTE — Telephone Encounter (Signed)
OK. Thx

## 2023-02-09 NOTE — Patient Instructions (Signed)
It was a pleasure to see you today at our office. You have memory impairment with cause being unknown but we are working  on it but the workup is in progress as listed below.   Recommendations:  Neurocognitive evaluation at our office  Continue memantine as prescribed  Follow up in 6 months   Whom to call:  Memory  decline, memory medications: Call our office [phone]   For psychiatric meds, mood meds: Please have your primary care physician manage these medications.    For assessment of decision of mental capacity and competency:  Call Dr. Anthoney Harada, geriatric psychiatrist at [phone]  For guidance in geriatric dementia issues please call [facility] [phone]     If you have any severe symptoms of a stroke, or other severe issues such as confusion,severe chills or fever, etc call 911 or go to the ER as you may need to be evaluated further         RECOMMENDATIONS FOR ALL PATIENTS WITH MEMORY PROBLEMS: 1. Continue to exercise (Recommend 30 minutes of walking everyday, or 3 hours every week) 2. Increase social interactions - continue going to [place] and enjoy social gatherings with friends and family 3. Eat healthy, avoid fried foods and eat more fruits and vegetables 4. Maintain adequate blood pressure, blood sugar, and blood cholesterol level. Reducing the risk of stroke and cardiovascular disease also helps promoting better memory. 5. Avoid stressful situations. Live a simple life and avoid aggravations. Organize your time and prepare for the next day in anticipation. 6. Sleep well, avoid any interruptions of sleep and avoid any distractions in the bedroom that may interfere with adequate sleep quality 7. Avoid sugar, avoid sweets as there is a strong link between excessive sugar intake, diabetes, and cognitive impairment We discussed the Mediterranean diet, which has been shown to help patients reduce the risk of progressive memory disorders and  reduces cardiovascular risk. This includes eating fish, eat fruits and green leafy vegetables, nuts like almonds and hazelnuts, walnuts, and also use olive oil. Avoid fast foods and fried foods as much as possible. Avoid sweets and sugar as sugar use has been linked to worsening of memory function.  There is always a concern of gradual progression of memory problems. If this is the case, then we may need to adjust level of care according to patient needs. Support, both to the patient and caregiver, should then be put into place.      You have been referred for a neuropsychological evaluation (i.e., evaluation of memory and thinking abilities). Please bring someone with you to this appointment if possible, as it is helpful for the doctor to hear from both you and another adult who knows you well. Please bring eyeglasses and hearing aids if you wear them.    The evaluation will take approximately 3 hours and has two parts:   The first part is a clinical interview with the neuropsychologist (Dr. Melvyn Novas or Dr. Nicole Kindred). During the interview, the neuropsychologist will speak with you and the individual you brought to the appointment.    The second part of the evaluation is testing with the doctor's technician Hinton Dyer or Maudie Mercury). During the testing, the technician will ask you to remember different types of material, solve problems, and answer some questionnaires. Your family member will not be present for this portion of the evaluation.   Please note: We must reserve several hours of the neuropsychologist's time and the psychometrician's time for your evaluation appointment. As such,  there is a No-Show fee of $100. If you are unable to attend any of your appointments, please contact our office as soon as possible to reschedule.    FALL PRECAUTIONS: Be cautious when walking. Scan the area for obstacles that may increase the risk of trips and falls. When getting up in the mornings, sit up at the edge of the bed  for a few minutes before getting out of bed. Consider elevating the bed at the head end to avoid drop of blood pressure when getting up. Walk always in a well-lit room (use night lights in the walls). Avoid area rugs or power cords from appliances in the middle of the walkways. Use a walker or a cane if necessary and consider physical therapy for balance exercise. Get your eyesight checked regularly.  FINANCIAL OVERSIGHT: Supervision, especially oversight when making financial decisions or transactions is also recommended.  HOME SAFETY: Consider the safety of the kitchen when operating appliances like stoves, microwave oven, and blender. Consider having supervision and share cooking responsibilities until no longer able to participate in those. Accidents with firearms and other hazards in the house should be identified and addressed as well.   ABILITY TO BE LEFT ALONE: If patient is unable to contact 911 operator, consider using [facility], or when the need is there, arrange for someone to stay with patients. Smoking is a fire hazard, consider supervision or cessation. Risk of wandering should be assessed by caregiver and if detected at any point, supervision and safe proof recommendations should be instituted.  MEDICATION SUPERVISION: Inability to self-administer medication needs to be constantly addressed. Implement a mechanism to ensure safe administration of the medications.   DRIVING: Regarding driving, in patients with progressive memory problems, driving will be impaired. We advise to have someone else do the driving if trouble finding directions or if minor accidents are reported. Independent driving assessment is available to determine safety of driving.   If you are interested in the driving assessment, you can contact the following:  [employer] in [place]  [place]  [phone] or [phone]    [place] refers to food and lifestyle choices that are based on the traditions of countries located on the [employer]. This way of eating has been shown to help prevent certain conditions and improve outcomes for people who have chronic diseases, like kidney disease and heart disease. What are tips for following this plan? Lifestyle  Cook and eat meals together with your family, when possible. Drink enough fluid to keep your urine clear or pale yellow. Be physically active every day. This includes: Aerobic exercise like running or swimming. Leisure activities like gardening, walking, or housework. Get 7-8 hours of sleep each night. If recommended by your health care provider, drink red wine in moderation. This means 1 glass a day for nonpregnant women and 2 glasses a day for men. A glass of wine equals 5 oz (150 mL). Reading food labels  Check the serving size of packaged foods. For foods such as rice and pasta, the serving size refers to the amount of cooked product, not dry. Check the total fat in packaged foods. Avoid foods that have saturated fat or trans fats. Check the ingredients list for added sugars, such as corn syrup. Shopping  At the grocery store, buy most of your food from the areas near the walls of the store. This includes: Fresh fruits and vegetables (  produce). Grains, beans, nuts, and seeds. Some of these may be available in unpackaged forms or large amounts (in bulk). Fresh seafood. Poultry and eggs. Low-fat dairy products. Buy whole ingredients instead of prepackaged foods. Buy fresh fruits and vegetables in-season from local farmers markets. Buy frozen fruits and vegetables in resealable bags. If you do not have access to quality fresh seafood, buy precooked frozen shrimp or canned fish, such as tuna, salmon, or sardines. Buy small amounts of raw or cooked vegetables, salads, or olives from the  deli or salad bar at your store. Stock your pantry so you always have certain foods on hand, such as olive oil, canned tuna, canned tomatoes, rice, pasta, and beans. Cooking  Cook foods with extra-virgin olive oil instead of using butter or other vegetable oils. Have meat as a side dish, and have vegetables or grains as your main dish. This means having meat in small portions or adding small amounts of meat to foods like pasta or stew. Use beans or vegetables instead of meat in common dishes like chili or lasagna. Experiment with different cooking methods. Try roasting or broiling vegetables instead of steaming or sauteing them. Add frozen vegetables to soups, stews, pasta, or rice. Add nuts or seeds for added healthy fat at each meal. You can add these to yogurt, salads, or vegetable dishes. Marinate fish or vegetables using olive oil, lemon juice, garlic, and fresh herbs. Meal planning  Plan to eat 1 vegetarian meal one day each week. Try to work up to 2 vegetarian meals, if possible. Eat seafood 2 or more times a week. Have healthy snacks readily available, such as: Vegetable sticks with hummus. Greek yogurt. Fruit and nut trail mix. Eat balanced meals throughout the week. This includes: Fruit: 2-3 servings a day Vegetables: 4-5 servings a day Low-fat dairy: 2 servings a day Fish, poultry, or lean meat: 1 serving a day Beans and legumes: 2 or more servings a week Nuts and seeds: 1-2 servings a day Whole grains: 6-8 servings a day Extra-virgin olive oil: 3-4 servings a day Limit red meat and sweets to only a few servings a month What are my food choices? Mediterranean diet Recommended Grains: Whole-grain pasta. Brown rice. Bulgar wheat. Polenta. Couscous. Whole-wheat bread. [place]. Vegetables: Artichokes. Beets. Broccoli. Cabbage. Carrots. Eggplant. Green beans. Chard. Kale. Spinach. Onions. Leeks. Peas. Squash. Tomatoes. Peppers. Radishes. Fruits: Apples. Apricots.  Avocado. Berries. Bananas. Cherries. Dates. Figs. Grapes. Lemons. Melon. Oranges. Peaches. Plums. Pomegranate. Meats and other protein foods: Beans. Almonds. Sunflower seeds. Pine nuts. Peanuts. [place]. Salmon. Scallops. Shrimp. [place]. Tilapia. Clams. Oysters. Eggs. Dairy: Low-fat milk. Cheese. Greek yogurt. Beverages: Water. Red wine. Herbal tea. Fats and oils: Extra virgin olive oil. Avocado oil. Grape seed oil. Sweets and desserts: [place] yogurt with honey. Baked apples. Poached pears. Trail mix. Seasoning and other foods: Basil. Cilantro. Coriander. Cumin. Mint. Parsley. Sage. Rosemary. Tarragon. Garlic. Oregano. Thyme. Pepper. Balsalmic vinegar. Tahini. Hummus. Tomato sauce. Olives. Mushrooms. Limit these Grains: Prepackaged pasta or rice dishes. Prepackaged cereal with added sugar. Vegetables: Deep fried potatoes (french fries). Fruits: Fruit canned in syrup. Meats and other protein foods: Beef. Pork. Lamb. Poultry with skin. Hot dogs. Berniece Salines. Dairy: Ice cream. Sour cream. Whole milk. Beverages: Juice. Sugar-sweetened soft drinks. Beer. Liquor and spirits. Fats and oils: Butter. Canola oil. Vegetable oil. Beef fat (tallow). Lard. Sweets and desserts: Cookies. Cakes. Pies. Candy. Seasoning and other foods: Mayonnaise. Premade sauces and marinades. The items listed may not be a complete list. Talk with your  dietitian about what dietary choices are right for you. Summary The Mediterranean diet includes both food and lifestyle choices. Eat a variety of fresh fruits and vegetables, beans, nuts, seeds, and whole grains. Limit the amount of red meat and sweets that you eat. Talk with your health care provider about whether it is safe for you to drink red wine in moderation. This means 1 glass a day for nonpregnant women and 2 glasses a day for men. A glass of wine equals 5 oz (150 mL). This information is not intended to replace advice given to you by your health care provider. Make sure you discuss  any questions you have with your health care provider. Document Released: 07/08/2016 Document Revised: 09/12/2 Your provider has requested that you have labwork completed today. Please go to [facility] ([id number]) on the second floor of this building before leaving the office today. You do not need to check in. If you are not called within 15 minutes please check with the front desk.  Your Mri will be [facility] [place]

## 2023-02-10 ENCOUNTER — Other Ambulatory Visit (INDEPENDENT_AMBULATORY_CARE_PROVIDER_SITE_OTHER): Payer: Medicare Other

## 2023-02-10 ENCOUNTER — Other Ambulatory Visit (HOSPITAL_COMMUNITY): Payer: Self-pay

## 2023-02-10 DIAGNOSIS — I10 Essential (primary) hypertension: Secondary | ICD-10-CM | POA: Diagnosis not present

## 2023-02-10 DIAGNOSIS — F419 Anxiety disorder, unspecified: Secondary | ICD-10-CM

## 2023-02-10 DIAGNOSIS — R413 Other amnesia: Secondary | ICD-10-CM

## 2023-02-10 DIAGNOSIS — R269 Unspecified abnormalities of gait and mobility: Secondary | ICD-10-CM | POA: Diagnosis not present

## 2023-02-10 DIAGNOSIS — I350 Nonrheumatic aortic (valve) stenosis: Secondary | ICD-10-CM

## 2023-02-10 DIAGNOSIS — M5416 Radiculopathy, lumbar region: Secondary | ICD-10-CM | POA: Diagnosis not present

## 2023-02-10 LAB — COMPREHENSIVE METABOLIC PANEL
ALT: 17 U/L (ref 0–35)
AST: 22 U/L (ref 0–37)
Albumin: 4.3 g/dL (ref 3.5–5.2)
Alkaline Phosphatase: 42 U/L (ref 39–117)
BUN: 15 mg/dL (ref 6–23)
CO2: 26 mEq/L (ref 19–32)
Calcium: 9.7 mg/dL (ref 8.4–10.5)
Chloride: 96 mEq/L (ref 96–112)
Creatinine, Ser: 0.61 mg/dL (ref 0.40–1.20)
GFR: 81.99 mL/min (ref >60.00)
Glucose, Bld: 97 mg/dL (ref 70–99)
Potassium: 4 mEq/L (ref 3.5–5.1)
Sodium: 133 mEq/L — ABNORMAL LOW (ref 135–145)
Total Bilirubin: 0.4 mg/dL (ref 0.2–1.2)
Total Protein: 7.7 g/dL (ref 6.0–8.3)

## 2023-02-10 LAB — URINALYSIS
Bilirubin Urine: NEGATIVE
Hgb urine dipstick: NEGATIVE
Ketones, ur: NEGATIVE
Leukocytes,Ua: NEGATIVE
Nitrite: NEGATIVE
Specific Gravity, Urine: 1.02 (ref 1.000–1.030)
Total Protein, Urine: NEGATIVE
Urine Glucose: NEGATIVE
Urobilinogen, UA: 0.2 (ref 0.0–1.0)
pH: 6 (ref 5.0–8.0)

## 2023-02-10 LAB — TSH: TSH: 1.71 u[IU]/mL (ref 0.35–5.50)

## 2023-02-10 NOTE — Progress Notes (Signed)
SWANZETTA, STEPHEN ([id number]) [id number] Page 1 of 7 Visit Report for 02/09/2023 Arrival Information Details Patient Name: Date of Service: [name], [name] 02/09/2023 2:00 PM Medical Record Number: [id number] Patient Account Number: [id number] Date of Birth/Sex: Treating RN: 1936-03-19 (87 y.o. [name], [name] Primary Care Tikia Skilton: Cassandria Anger Other Clinician: Referring Xue Low: Treating Lory Galan/Extender: Greig Right in Treatment: 1 Visit Information History Since Last Visit Added or deleted any medications: No Patient Arrived: [name] Any new allergies or adverse reactions: No Arrival Time: 14:01 Had a fall or experienced change in No Accompanied By: self activities of daily living that may affect Transfer Assistance: None risk of falls: Patient Identification Verified: Yes Signs or symptoms of abuse/neglect since last visito No Secondary Verification Process Completed: Yes Hospitalized since last visit: No Patient Requires Transmission-Based Precautions: No Implantable device outside of the clinic excluding No Patient Has Alerts: No cellular tissue based products placed in the center since last visit: Has Dressing in Place as Prescribed: Yes Pain Present Now: Yes Electronic Signature(s) Signed: 02/09/2023 5:26:11 PM By: Deon Pilling RN, BSN Entered By: Deon Pilling on 02/09/2023 14:02:58 -------------------------------------------------------------------------------- Clinic Level of Care Assessment Details Patient Name: Date of Service: [name], [name] 02/09/2023 2:00 PM Medical Record Number: [id number] Patient Account Number: [id number] Date of Birth/Sex: Treating RN: 1936-04-10 (87 y.o. [name], [name] Primary Care Nzinga Ferran: Cassandria Anger Other Clinician: Referring Kawena Lyday: Treating Rhylie Stehr/Extender: Greig Right in Treatment: 1 Clinic Level of Care Assessment  Items TOOL 4 Quantity Score X- 1 0 Use when only an EandM is performed on FOLLOW-UP visit ASSESSMENTS - Nursing Assessment / Reassessment X- 1 10 Reassessment of Co-morbidities (includes updates in patient status) X- 1 5 Reassessment of Adherence to Treatment Plan ASSESSMENTS - Wound and Skin A ssessment / Reassessment X - Simple Wound Assessment / Reassessment - one wound 1 5 '[]'$  - 0 Complex Wound Assessment / Reassessment - multiple wounds '[]'$  - 0 Dermatologic / Skin Assessment (not related to wound area) ASSESSMENTS - Focused Assessment '[]'$  - 0 Circumferential Edema Measurements - multi extremities '[]'$  - 0 Nutritional Assessment / Counseling / Intervention '[]'$  - 0 Lower Extremity Assessment (monofilament, tuning fork, pulses) '[]'$  - 0 Peripheral Arterial Disease Assessment (using hand held doppler) ASSESSMENTS - Ostomy and/or Continence Assessment and Care '[]'$  - 0 Incontinence Assessment and Management '[]'$  - 0 Ostomy Care Assessment and Management (repouching, etc.) PROCESS - Coordination of Care X - Simple Patient / Family Education for ongoing care 1 15 [name], [name] ([id number]) [id number] Page 2 of 7 '[]'$  - 0 Complex (extensive) Patient / Family Education for ongoing care X- 1 10 Staff obtains Programmer, systems, Records, T Results / Process Orders est '[]'$  - 0 Staff telephones HHA, Nursing Homes / Clarify orders / etc '[]'$  - 0 Routine Transfer to another Facility (non-emergent condition) '[]'$  - 0 Routine Hospital Admission (non-emergent condition) '[]'$  - 0 New Admissions / Biomedical engineer / Ordering NPWT Apligraf, etc. , '[]'$  - 0 Emergency Hospital Admission (emergent condition) X- 1 10 Simple Discharge Coordination '[]'$  - 0 Complex (extensive) Discharge Coordination PROCESS - Special Needs '[]'$  - 0 Pediatric / Minor Patient Management '[]'$  - 0 Isolation Patient Management '[]'$  - 0 Hearing / Language / Visual special needs '[]'$  - 0 Assessment of  Community assistance (transportation, D/C planning, etc.) '[]'$  - 0 Additional assistance / Altered mentation '[]'$  - 0 Support Surface(s) Assessment (bed, cushion, seat, etc.) INTERVENTIONS - Wound Cleansing / Measurement X -  Simple Wound Cleansing - one wound 1 5 '[]'$  - 0 Complex Wound Cleansing - multiple wounds X- 1 5 Wound Imaging (photographs - any number of wounds) '[]'$  - 0 Wound Tracing (instead of photographs) X- 1 5 Simple Wound Measurement - one wound '[]'$  - 0 Complex Wound Measurement - multiple wounds INTERVENTIONS - Wound Dressings X - Small Wound Dressing one or multiple wounds 1 10 '[]'$  - 0 Medium Wound Dressing one or multiple wounds '[]'$  - 0 Large Wound Dressing one or multiple wounds '[]'$  - 0 Application of Medications - topical '[]'$  - 0 Application of Medications - injection INTERVENTIONS - Miscellaneous '[]'$  - 0 External ear exam '[]'$  - 0 Specimen Collection (cultures, biopsies, blood, body fluids, etc.) '[]'$  - 0 Specimen(s) / Culture(s) sent or taken to Lab for analysis '[]'$  - 0 Patient Transfer (multiple staff / Civil Service fast streamer / Similar devices) '[]'$  - 0 Simple Staple / Suture removal (25 or less) '[]'$  - 0 Complex Staple / Suture removal (26 or more) '[]'$  - 0 Hypo / Hyperglycemic Management (close monitor of Blood Glucose) '[]'$  - 0 Ankle / Brachial Index (ABI) - do not check if billed separately X- 1 5 Vital Signs Has the patient been seen at the hospital within the last three years: Yes Total Score: 85 Level Of Care: New/Established - Level 3 Electronic Signature(s) Signed: 02/09/2023 5:26:11 PM By: Deon Pilling RN, BSN Entered By: Deon Pilling on 02/09/2023 15:21:41 [name] ([id number]) [id number] Page 3 of 7 -------------------------------------------------------------------------------- Encounter Discharge Information Details Patient Name: Date of Service: [name], [name] 02/09/2023 2:00 PM Medical Record Number: [id number] Patient Account  Number: [id number] Date of Birth/Sex: Treating RN: Sep 12, 1936 (87 y.o. [name] Primary Care Trenna Kiely: Cassandria Anger Other Clinician: Referring Octavius Shin: Treating Jiah Bari/Extender: Greig Right in Treatment: 1 Encounter Discharge Information Items Discharge Condition: Stable Ambulatory Status: Cane Discharge Destination: Home Transportation: Private Auto Accompanied By: self Schedule Follow-up Appointment: No Clinical Summary of Care: Notes rescheduled patient due to unable to stay to see Zenora Karpel another appt at 330. Changed [place] to nurse visit and rescheduled her to next week. Electronic Signature(s) Signed: 02/09/2023 5:26:11 PM By: Deon Pilling RN, BSN Entered By: Deon Pilling on 02/09/2023 15:20:15 -------------------------------------------------------------------------------- Lower Extremity Assessment Details Patient Name: Date of Service: [name], [name] 02/09/2023 2:00 PM Medical Record Number: [id number] Patient Account Number: [id number] Date of Birth/Sex: Treating RN: [birth year] (87 y.o. [name] Primary Care Mikhala Kenan: Cassandria Anger Other Clinician: Referring Elvie Maines: Treating Eathen Budreau/Extender: Greig Right in Treatment: 1 Edema Assessment Assessed: [Left: No] [Right: Yes] [Left: Edema] [Right: :] Calf Left: Right: Point of Measurement: 28 cm From Medial Instep 29 cm Ankle Left: Right: Point of Measurement: 10 cm From Medial Instep 20 cm Vascular Assessment Pulses: Dorsalis Pedis Palpable: [Right:Yes] Electronic Signature(s) Signed: 02/09/2023 5:26:11 PM By: Deon Pilling RN, BSN Entered By: Deon Pilling on 02/09/2023 14:04:32 -------------------------------------------------------------------------------- Multi-Disciplinary Care Plan Details Patient Name: Date of Service: [name], Brandis H. 02/09/2023 2:00 PM Medical Record Number: [id number] Patient Account Number:  [id number] Date of Birth/Sex: Treating RN: 10-Jun-1936 (87 y.o. [address] P[address] [place], [place] ([id number]) [id number] Page 4 of 7 Primary Care Pardeep Pautz: Cassandria Anger Other Clinician: Referring Perrin Eddleman: Treating Tobyn Osgood/Extender: Greig Right in Treatment: 1 Active Inactive Wound/Skin Impairment Nursing Diagnoses: Impaired tissue integrity Knowledge deficit related to ulceration/compromised skin integrity Goals: Patient/caregiver will verbalize understanding of skin care regimen Date Initiated: 02/02/2023 Target  Resolution Date: 03/02/2023 Goal Status: Active Ulcer/skin breakdown will have a volume reduction of 30% by week 4 Date Initiated: 02/02/2023 Target Resolution Date: 03/02/2023 Goal Status: Active Interventions: Assess patient/caregiver ability to obtain necessary supplies Assess patient/caregiver ability to perform ulcer/skin care regimen upon admission and as needed Assess ulceration(s) every visit Provide education on ulcer and skin care Notes: Electronic Signature(s) Signed: 02/09/2023 5:26:11 PM By: Deon Pilling RN, BSN Entered By: Deon Pilling on 02/09/2023 14:14:58 -------------------------------------------------------------------------------- Pain Assessment Details Patient Name: Date of Service: [name], [name] 02/09/2023 2:00 PM Medical Record Number: [id number] Patient Account Number: [id number] Date of Birth/Sex: Treating RN: 1936-05-23 (87 y.o. [name] Primary Care Verneta Hamidi: Cassandria Anger Other Clinician: Referring Krissa Utke: Treating Hearl Heikes/Extender: Greig Right in Treatment: 1 Active Problems Location of Pain Severity and Description of Pain Patient Has Paino Yes Site Locations Pain Location: Pain in Ulcers Rate the pain. Current Pain Level: 2 Character of Pain Describe the Pain: Burning Pain Management and Medication Current Pain  Management: ALESE, OATS ([id number]) [id number] Page 5 of 7 Medication: No Cold Application: No Rest: No Massage: No Activity: No T.E.N.S.: No Heat Application: No Leg drop or elevation: No Is the Current Pain Management Adequate: Adequate How does your wound impact your activities of daily livingo Sleep: No Bathing: No Appetite: No Relationship With Others: No Bladder Continence: No Emotions: No Bowel Continence: No Work: No Toileting: No Drive: No Dressing: No Hobbies: No Engineer, maintenance) Signed: 02/09/2023 5:26:11 PM By: Deon Pilling RN, BSN Entered By: Deon Pilling on 02/09/2023 14:03:13 -------------------------------------------------------------------------------- Patient/Caregiver Education Details Patient Name: Date of Service: [name], [name] 3/13/2024andnbsp2:00 PM Medical Record Number: [id number] Patient Account Number: [id number] Date of Birth/Gender: Treating RN: [birth year] (87 y.o. [name] Primary Care Physician: Cassandria Anger Other Clinician: Referring Physician: Treating Physician/Extender: Greig Right in Treatment: 1 Education Assessment Education Provided To: Patient Education Topics Provided Wound/Skin Impairment: Handouts: Caring for Your Ulcer Methods: Explain/Verbal Responses: Reinforcements needed Electronic Signature(s) Signed: 02/09/2023 5:26:11 PM By: Deon Pilling RN, BSN Entered By: Deon Pilling on 02/09/2023 14:15:08 -------------------------------------------------------------------------------- Wound Assessment Details Patient Name: Date of Service: [name], [name] 02/09/2023 2:00 PM Medical Record Number: [id number] Patient Account Number: [id number] Date of Birth/Sex: Treating RN: 08-11-1936 (87 y.o. [name] Primary Care Marcelo Ickes: Cassandria Anger Other Clinician: Referring Marrie Chandra: Treating Yesena Reaves/Extender: Greig Right in Treatment: 1 Wound Status Wound Number: 1 Primary Etiology: Abrasion Wound Location: Right, Anterior Lower Leg Wound Status: Open Wounding Event: Skin Tear/Laceration Comorbid Cataracts, Asthma, Congestive Heart Failure, History: Osteoarthritis Date Acquired: 01/10/2023 Weeks Of Treatment: 1 Clustered Wound: Yes Photos [name], [name] ([id number]) [id number] Page 6 of 7 Wound Measurements Length: (cm) Width: (cm) Depth: (cm) Clustered Quantity: Area: (cm) Volume: (cm) 5.7 % Reduction in Area: 9.5% 6 % Reduction in Volume: 9.5% 0.1 Epithelialization: Small (1-33%) 2 Tunneling: No 26.861 Undermining: No 2.686 Wound Description Classification: Full Thickness Without Exposed Sup Wound Margin: Distinct, outline attached Exudate Amount: Medium Exudate Type: Serosanguineous Exudate Color: red, brown port Structures Foul Odor After Cleansing: No Slough/Fibrino Yes Wound Bed Granulation Amount: Large (67-100%) Exposed Structure Granulation Quality: Red Fascia Exposed: No Necrotic Amount: Small (1-33%) Fat Layer (Subcutaneous Tissue) Exposed: Yes Necrotic Quality: Adherent Slough Tendon Exposed: No Muscle Exposed: No Joint Exposed: No Bone Exposed: No Periwound Skin Texture Texture Color No Abnormalities Noted: No No Abnormalities Noted: No Callus: No Atrophie Blanche: No Crepitus: No  Cyanosis: No Excoriation: No Ecchymosis: No Induration: No Erythema: No Rash: No Hemosiderin Staining: No Scarring: Yes Mottled: No Pallor: No Moisture Rubor: No No Abnormalities Noted: No Dry / Scaly: No Maceration: No Treatment Notes Wound #1 (Lower Leg) Wound Laterality: Right, Anterior Cleanser Soap and Water Discharge Instruction: May shower and wash wound with dial antibacterial soap and water prior to dressing change. Peri-Wound Care Skin Prep Discharge Instruction: Use skin prep as  directed Topical Primary Dressing Hydrofera Blue Ready Transfer Foam, 4x5 (in/in) Discharge Instruction: Apply to wound bed as instructed Secondary Dressing ALLEVYN Gentle Border, 5x5 (in/in) Discharge Instruction: Apply over primary dressing as directed. Secured With PENNY, MANOLIS ([id number]) [id number] Page 7 of 7 Compression Wrap Compression Stockings Add-Ons Electronic Signature(s) Signed: 02/09/2023 5:26:11 PM By: Deon Pilling RN, BSN Entered By: Deon Pilling on 02/09/2023 14:13:00 -------------------------------------------------------------------------------- Vitals Details Patient Name: Date of Service: [name], [name] 02/09/2023 2:00 PM Medical Record Number: [id number] Patient Account Number: [id number] Date of Birth/Sex: Treating RN: 07-06-1936 (87 y.o. [name] Primary Care Aurie Harroun: Cassandria Anger Other Clinician: Referring Kyriaki Moder: Treating Kimberley Speece/Extender: Greig Right in Treatment: 1 Vital Signs Time Taken: 14:10 Temperature (F): 97.6 Pulse (bpm): 77 Respiratory Rate (breaths/min): 20 Blood Pressure (mmHg): 146/76 Reference Range: 80 - 120 mg / dl Electronic Signature(s) Signed: 02/09/2023 5:26:11 PM By: Deon Pilling RN, BSN Entered By: Deon Pilling on 02/09/2023 14:10:27

## 2023-02-11 ENCOUNTER — Encounter (HOSPITAL_COMMUNITY): Payer: Self-pay | Admitting: Internal Medicine

## 2023-02-11 ENCOUNTER — Ambulatory Visit (HOSPITAL_BASED_OUTPATIENT_CLINIC_OR_DEPARTMENT_OTHER)
Admission: RE | Admit: 2023-02-11 | Discharge: 2023-02-11 | Disposition: A | Discharge status: Home / Self Care | Payer: Medicare Other | Source: Ambulatory Visit | Attending: Internal Medicine | Admitting: Internal Medicine

## 2023-02-11 ENCOUNTER — Ambulatory Visit (HOSPITAL_COMMUNITY)
Admission: RE | Admit: 2023-02-11 | Discharge: 2023-02-11 | Disposition: A | Discharge status: Home / Self Care | Payer: Medicare Other | Source: Ambulatory Visit | Attending: Internal Medicine | Admitting: Internal Medicine

## 2023-02-11 ENCOUNTER — Ambulatory Visit: Payer: Medicare Other | Admitting: Rheumatology

## 2023-02-11 VITALS — BP 130/70 | HR 77 | Wt 117.0 lb

## 2023-02-11 DIAGNOSIS — M7061 Trochanteric bursitis, right hip: Secondary | ICD-10-CM

## 2023-02-11 DIAGNOSIS — I080 Rheumatic disorders of both mitral and aortic valves: Secondary | ICD-10-CM | POA: Diagnosis not present

## 2023-02-11 DIAGNOSIS — R2681 Unsteadiness on feet: Secondary | ICD-10-CM

## 2023-02-11 DIAGNOSIS — I509 Heart failure, unspecified: Secondary | ICD-10-CM | POA: Diagnosis not present

## 2023-02-11 DIAGNOSIS — E785 Hyperlipidemia, unspecified: Secondary | ICD-10-CM

## 2023-02-11 DIAGNOSIS — M19041 Primary osteoarthritis, right hand: Secondary | ICD-10-CM

## 2023-02-11 DIAGNOSIS — E034 Atrophy of thyroid (acquired): Secondary | ICD-10-CM

## 2023-02-11 DIAGNOSIS — I110 Hypertensive heart disease with heart failure: Secondary | ICD-10-CM | POA: Insufficient documentation

## 2023-02-11 DIAGNOSIS — Z8719 Personal history of other diseases of the digestive system: Secondary | ICD-10-CM

## 2023-02-11 DIAGNOSIS — G8929 Other chronic pain: Secondary | ICD-10-CM

## 2023-02-11 DIAGNOSIS — I350 Nonrheumatic aortic (valve) stenosis: Secondary | ICD-10-CM

## 2023-02-11 DIAGNOSIS — M5030 Other cervical disc degeneration, unspecified cervical region: Secondary | ICD-10-CM

## 2023-02-11 DIAGNOSIS — Z8639 Personal history of other endocrine, nutritional and metabolic disease: Secondary | ICD-10-CM

## 2023-02-11 DIAGNOSIS — I10 Essential (primary) hypertension: Secondary | ICD-10-CM

## 2023-02-11 DIAGNOSIS — Z79899 Other long term (current) drug therapy: Secondary | ICD-10-CM | POA: Insufficient documentation

## 2023-02-11 DIAGNOSIS — I341 Nonrheumatic mitral (valve) prolapse: Secondary | ICD-10-CM | POA: Diagnosis not present

## 2023-02-11 DIAGNOSIS — G3184 Mild cognitive impairment, so stated: Secondary | ICD-10-CM | POA: Diagnosis not present

## 2023-02-11 DIAGNOSIS — M1610 Unilateral primary osteoarthritis, unspecified hip: Secondary | ICD-10-CM | POA: Insufficient documentation

## 2023-02-11 DIAGNOSIS — M19071 Primary osteoarthritis, right ankle and foot: Secondary | ICD-10-CM

## 2023-02-11 DIAGNOSIS — M5136 Other intervertebral disc degeneration, lumbar region: Secondary | ICD-10-CM

## 2023-02-11 DIAGNOSIS — M810 Age-related osteoporosis without current pathological fracture: Secondary | ICD-10-CM

## 2023-02-11 DIAGNOSIS — I351 Nonrheumatic aortic (valve) insufficiency: Secondary | ICD-10-CM

## 2023-02-11 DIAGNOSIS — K8020 Calculus of gallbladder without cholecystitis without obstruction: Secondary | ICD-10-CM

## 2023-02-11 DIAGNOSIS — Z86010 Personal history of colonic polyps: Secondary | ICD-10-CM

## 2023-02-11 DIAGNOSIS — Z981 Arthrodesis status: Secondary | ICD-10-CM | POA: Insufficient documentation

## 2023-02-11 DIAGNOSIS — I358 Other nonrheumatic aortic valve disorders: Secondary | ICD-10-CM

## 2023-02-11 LAB — ECHOCARDIOGRAM COMPLETE
AR max vel: 1.78 cm2
AV Area VTI: 1.81 cm2
AV Area mean vel: 1.81 cm2
AV Mean grad: 5 mmHg
AV Peak grad: 8.4 mmHg
Ao pk vel: 1.45 m/s
Area-P 1/2: 3.48 cm2
MV M vel: 5.35 m/s
MV Peak grad: 114.5 mmHg
P 1/2 time: 436 msec
Radius: 0.4 cm
S' Lateral: 2.9 cm

## 2023-02-11 NOTE — Patient Instructions (Signed)
No change to your medications  Your physician has requested that you have an echocardiogram. Echocardiography is a painless test that uses sound waves to create images of your heart. It provides your doctor with information about the size and shape of your heart and how well your heart's chambers and valves are working. This procedure takes approximately one hour. There are no restrictions for this procedure. Please do NOT wear cologne, perfume, aftershave, or lotions (deodorant is allowed). Please arrive 15 minutes prior to your appointment time.  Your physician recommends that you schedule a follow-up appointment in: 12 months with echocardiogram( March 2025) Call office February 2025  If you have any questions or concerns before your next appointment please send [place] a message through [place] or call our office at [phone].    TO LEAVE A MESSAGE FOR THE NURSE SELECT OPTION 2, PLEASE LEAVE A MESSAGE INCLUDING: YOUR NAME DATE OF BIRTH CALL BACK NUMBER REASON FOR CALL**this is important as we prioritize the call backs  YOU WILL RECEIVE A CALL BACK THE SAME DAY AS LONG AS YOU CALL BEFORE 4:00 PM  At the [facility], you and your health needs are our priority. As part of our continuing mission to provide you with exceptional heart care, we have created designated Provider Care Teams. These Care Teams include your primary Cardiologist (physician) and Advanced Practice Providers (APPs- Physician Assistants and Nurse Practitioners) who all work together to provide you with the care you need, when you need it.   You may see any of the following providers on your designated Care Team at your next follow up: Dr Glori Bickers Dr Loralie Champagne Dr. Roxana Hires, NP Lyda Jester, [place] [facility] [place], [place] Forestine Na, NP Audry Riles, PharmD   Please be sure to bring in all your medications bottles to every appointment.    Thank you for  choosing [facility]

## 2023-02-11 NOTE — Progress Notes (Signed)
RINDI, MENESES ([id number]) [id number] Page 1 of 2 Visit Report for 02/09/2023 Problem List Details Patient Name: Date of Service: [name], [name] 02/09/2023 2:00 PM Medical Record Number: [id number] Patient Account Number: [id number] Date of Birth/Sex: Treating RN: 1936/05/21 (87 y.o. 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 02/09/2023 Medical Record Number: [id number] Patient Account Number: [id number] Date of Birth/Sex: Treating RN: 04-01-1936 (87 y.o. [name] Primary Care Provider: Cassandria Anger Other Clinician: Referring Provider: Treating Provider/Extender: Greig Right in Treatment: 1 Diagnosis Coding ICD-10 Codes Code Description [phone] Non-pressure chronic ulcer of other part of right lower leg limited to breakdown of skin S80.811D Abrasion, right lower leg, subsequent encounter I87.311 Chronic venous hypertension (idiopathic) with ulcer of right lower extremity Facility Procedures : CPT4 Code: [id number] Description: 99213 - WOUND CARE  VISIT-LEV 3 EST PT Modifier: Quantity: 1 Electronic Signature(s) Signed: 02/09/2023 5:26:11 PM By: Deon Pilling RN, BSN Signed: 02/10/2023 3:34:03 PM By: Charmayne Sheer ([id number]) [id number] Page 2 of 2 Entered By: Deon Pilling on 02/09/2023 16:13:30

## 2023-02-11 NOTE — Progress Notes (Signed)
CARDIOLOGY CLINIC NOTE  Patient ID: [name], female   DOB: 12/25/1935, 87 y.o.   MRN: [id number]  HPI:  [name] is a 87 year old woman with a history of hyperlipidemia, mild mitral valve prolapse with mild mitral regurgitation (echo 12/11).  She has undergone CT angiogram at the [facility] about 10 years ago which was totally normal.   Had cervical fusion 2 years ago   She returns today for regular follow up. Her husband died recently. Struggling with her memory a bit. Struggling with arthritis in her hip. No CP, SOB or edema.   Echo today 02/11/23 EF 60-65%  G1DD. Mild AI Mild MR  Echo 02/02/21: EF 60-65% G 1 DD AoV moderately calcified. Mild AS Mean gradient 67mm HG Mild AI.  Echo 8/20 EF 60-65 grade I DD Mild AI. No significant AS  Echo 02/2017: EF 55%Trivial MVP Mild MR Calcified Aov Moves well. Mild AI. No AS Echo 02/2018: EF 60% Mild MR Calcified AoV Very mild AS mean gradient 4 Mild AI Grade I DD Personally reviewed   Lab Results  Component Value Date   CHOL 186 02/17/2022   HDL 59.30 02/17/2022   LDLCALC 99 02/17/2022   LDLDIRECT 98.0 08/13/2021   TRIG 141.0 02/17/2022   CHOLHDL 3 02/17/2022    ROS: All systems negative except as listed in HPI, PMH and Problem List.  Past Medical History:  Diagnosis Date   Allergic rhinitis    Asthma    Atrophic vaginitis    Baker's cyst    Left-Dr. Aluisio   Cataract    Dr. Katy Fitch   CHF (congestive heart failure) (HCC)    Colon polyps    Fibroid    Gastritis    chronic   GERD (gastroesophageal reflux disease)    Heart murmur    Hemorrhoids    Hyperlipidemia    Hypothyroidism    IBS (irritable bowel syndrome)    Lumbar spondylosis    MVP (mitral valve prolapse)    Antibiotics required for dental procedures   OA (osteoarthritis)    Osteoporosis 06/2018   T score -2.2 stable on Prolia   Overactive bladder    Sacroiliitis (HCC)    Thyroid disease    Thyroid nodule    Torn meniscus    bilateral    Current  Outpatient Medications  Medication Sig Dispense Refill   acetaminophen (TYLENOL) 500 MG tablet Take 1,000 mg by mouth every 6 (six) hours as needed for mild pain.     albuterol (VENTOLIN HFA) 108 (90 Base) MCG/ACT inhaler Take 2 puffs every 4-6 hours as needed     amLODipine (NORVASC) 2.5 MG tablet Take 1 tablet (2.5 mg total) by mouth daily as needed. Take if BP is above 170/100 14 tablet 0   Azelastine HCl 0.15 % SOLN Place into both nostrils. Place into both nostrils.     Calcium Carbonate-Vitamin D (CALCIUM CARBONATE W/VITAMIN D PO) Take 1 tablet by mouth daily.     desonide (DESOWEN) 0.05 % cream Apply 1 application topically as needed.     diclofenac Sodium (VOLTAREN) 1 % GEL APPLY 4 GRAMS 4 TIMES DAILY. 200 g 3   EPINEPHrine (EPIPEN 2-PAK) 0.3 mg/0.3 mL IJ SOAJ injection Inject 0.3 mLs (0.3 mg total) into the muscle as needed. 1 Device 1   famotidine (PEPCID) 40 MG tablet Take 1 tablet (40 mg total) by mouth at bedtime. NEEDS OFFICE VISIT FOR ADDITIONAL REFILLS 30 tablet 2   Gauze Pads & Dressings (TELFA  ADHESIVE DRESSING) 3"X4" PADS Use qd 20 each 0   ipratropium (ATROVENT) 0.06 % nasal spray Place 1 spray into the nose 3 (three) times daily as needed for rhinitis. 15 mL 0   levocetirizine (XYZAL) 5 MG tablet Take 5 mg by mouth every evening.     levothyroxine (SYNTHROID) 50 MCG tablet TAKE ONE TABLET BY MOUTH DAILY BEFORE BREAKFAST 30 tablet 5   LORazepam (ATIVAN) 1 MG tablet Take 2-3 tablets at bedtime and 1-2 tablets in the daytime as needed. 150 tablet 2   memantine (NAMENDA) 5 MG tablet Take 5 mg by mouth 2 (two) times daily.     montelukast (SINGULAIR) 10 MG tablet Take 1 tablet (10 mg total) by mouth at bedtime. 90 tablet 1   polyethylene glycol powder (GLYCOLAX/MIRALAX) 17 GM/SCOOP powder Take 17 g by mouth 2 (two) times daily as needed for moderate constipation. 500 g 3   simvastatin (ZOCOR) 40 MG tablet TAKE ONE TABLET ONCE DAILY 90 tablet 3   traMADol (ULTRAM) 50 MG tablet Take  1 tablet (50 mg total) by mouth every 6 (six) hours as needed for severe pain. 20 tablet 1   triamcinolone (NASACORT) 55 MCG/ACT AERO nasal inhaler Place 2 sprays into the nose as needed.     VESICARE 10 MG tablet Take 1 tablet (10 mg total) by mouth daily as needed (bladder). 90 tablet 3   pregabalin (LYRICA) 25 MG capsule take 1 capsule by oral route 2 times every day; start 1 qhsX5 days, then BID (Patient not taking: Reported on 02/11/2023)     No current facility-administered medications for this encounter.    Filed Weights   02/11/23 1348  Weight: 53.1 kg (117 lb)    PHYSICAL EXAM: Vitals:   02/11/23 1348  BP: 130/70  Pulse: 77  SpO2: 96%   General:  Well appearing. No resp difficulty HEENT: normal Neck: limited ROM . no JVD. Carotids 2+ bilat; no bruits. No lymphadenopathy or thryomegaly appreciated. Cor: PMI nondisplaced. Regular rate & rhythm. No rubs, gallops or murmurs. Lungs: clear Abdomen: soft, nontender, nondistended. No hepatosplenomegaly. No bruits or masses. Good bowel sounds. Extremities: no cyanosis, clubbing, rash, edema Neuro: alert & orientedx3, cranial nerves grossly intact. moves all 4 extremities w/o difficulty. Affect pleasant  ASSESSMENT & PLAN:  1. Aortic valve thickening with mild AS/mild AI - Echo  02/02/21 EF 60-65 grade I DD Mild AI. No significant AS  - Echo today 02/11/23 EF 60-65%  G1DD. Mild AI Mild MR  - F/u echo 1 year  2. MVP, mild -  Stable mild MR  3. Hyperlipidemia - Followed by PCP - On simva 40   4. HTN - Blood pressure well controlled. Continue current regimen.   Glori Bickers, MD 2:01 PM

## 2023-02-14 ENCOUNTER — Ambulatory Visit (INDEPENDENT_AMBULATORY_CARE_PROVIDER_SITE_OTHER): Payer: Medicare Other | Admitting: Internal Medicine

## 2023-02-14 ENCOUNTER — Encounter: Payer: Self-pay | Admitting: Internal Medicine

## 2023-02-14 VITALS — BP 118/72 | HR 77 | Temp 98.6°F | Ht 60.5 in | Wt 116.0 lb

## 2023-02-14 DIAGNOSIS — R269 Unspecified abnormalities of gait and mobility: Secondary | ICD-10-CM | POA: Diagnosis not present

## 2023-02-14 DIAGNOSIS — M545 Low back pain, unspecified: Secondary | ICD-10-CM

## 2023-02-14 DIAGNOSIS — K219 Gastro-esophageal reflux disease without esophagitis: Secondary | ICD-10-CM

## 2023-02-14 DIAGNOSIS — G8929 Other chronic pain: Secondary | ICD-10-CM | POA: Diagnosis not present

## 2023-02-14 NOTE — Assessment & Plan Note (Signed)
Worse [name] - [name] is seeing Dr Oneida Alar - appt tomorrow Started PT at [facility]. [name] is worse after PT. Pt did not sleep last night. Pain irradiated down the R foot. Using a walker now. Tramadol and Tylenol prn Depo-medrol 80 mg IM

## 2023-02-14 NOTE — Assessment & Plan Note (Signed)
Chronic. Better on Gluten free diet Dexilant  Potential benefits of a long term PPI  use as well as potential risks  and complications were explained to the patient and were aknowledged. GERD wedge

## 2023-02-14 NOTE — Progress Notes (Signed)
Subjective:  Patient ID: [name], female    DOB: 23-Jun-1936  Age: 87 y.o. MRN: [id number]  CC: Follow-up   HPI [name] presents for [name] - [name] is seeing Dr Hampton Abbot PT at [facility]. [name] is worse after PT - started a few hrs later. Pt did not sleep last night. Pain irradiated down to the R foot, not now Using a walker now  Outpatient Medications Prior to Visit  Medication Sig Dispense Refill   acetaminophen (TYLENOL) 500 MG tablet Take 1,000 mg by mouth every 6 (six) hours as needed for mild pain.     albuterol (VENTOLIN HFA) 108 (90 Base) MCG/ACT inhaler Take 2 puffs every 4-6 hours as needed     amLODipine (NORVASC) 2.5 MG tablet Take 1 tablet (2.5 mg total) by mouth daily as needed. Take if BP is above 170/100 14 tablet 0   Azelastine HCl 0.15 % SOLN Place into both nostrils. Place into both nostrils.     Calcium Carbonate-Vitamin D (CALCIUM CARBONATE W/VITAMIN D PO) Take 1 tablet by mouth daily.     desonide (DESOWEN) 0.05 % cream Apply 1 application topically as needed.     diclofenac Sodium (VOLTAREN) 1 % GEL APPLY 4 GRAMS 4 TIMES DAILY. 200 g 3   EPINEPHrine (EPIPEN 2-PAK) 0.3 mg/0.3 mL IJ SOAJ injection Inject 0.3 mLs (0.3 mg total) into the muscle as needed. 1 Device 1   famotidine (PEPCID) 40 MG tablet Take 1 tablet (40 mg total) by mouth at bedtime. NEEDS OFFICE VISIT FOR ADDITIONAL REFILLS 30 tablet 2   Gauze Pads & Dressings (TELFA ADHESIVE DRESSING) 3"X4" PADS Use qd 20 each 0   ipratropium (ATROVENT) 0.06 % nasal spray Place 1 spray into the nose 3 (three) times daily as needed for rhinitis. 15 mL 0   levocetirizine (XYZAL) 5 MG tablet Take 5 mg by mouth every evening.     levothyroxine (SYNTHROID) 50 MCG tablet TAKE ONE TABLET BY MOUTH DAILY BEFORE BREAKFAST 30 tablet 5   LORazepam (ATIVAN) 1 MG tablet Take 2-3 tablets at bedtime and 1-2 tablets in the daytime as needed. 150 tablet 2   memantine (NAMENDA) 5 MG tablet Take 5 mg by mouth 2 (two) times  daily.     montelukast (SINGULAIR) 10 MG tablet Take 1 tablet (10 mg total) by mouth at bedtime. 90 tablet 1   polyethylene glycol powder (GLYCOLAX/MIRALAX) 17 GM/SCOOP powder Take 17 g by mouth 2 (two) times daily as needed for moderate constipation. 500 g 3   simvastatin (ZOCOR) 40 MG tablet TAKE ONE TABLET ONCE DAILY 90 tablet 3   traMADol (ULTRAM) 50 MG tablet Take 1 tablet (50 mg total) by mouth every 6 (six) hours as needed for severe pain. 20 tablet 1   triamcinolone (NASACORT) 55 MCG/ACT AERO nasal inhaler Place 2 sprays into the nose as needed.     VESICARE 10 MG tablet Take 1 tablet (10 mg total) by mouth daily as needed (bladder). 90 tablet 3   pregabalin (LYRICA) 25 MG capsule take 1 capsule by oral route 2 times every day; start 1 qhsX5 days, then BID (Patient not taking: Reported on 02/11/2023)     No facility-administered medications prior to visit.    ROS: Review of Systems  Constitutional:  Negative for activity change, appetite change, chills, fatigue and unexpected weight change.  HENT:  Negative for congestion, mouth sores and sinus pressure.   Eyes:  Negative for visual disturbance.  Respiratory:  Negative for cough and chest tightness.   Gastrointestinal:  Negative for abdominal pain and nausea.  Genitourinary:  Negative for difficulty urinating, frequency and vaginal pain.  Musculoskeletal:  Negative for back pain and gait problem.  Skin:  Negative for pallor and rash.  Neurological:  Negative for dizziness, tremors, weakness, numbness and headaches.  Psychiatric/Behavioral:  Negative for confusion and sleep disturbance.     Objective:  BP 118/72 (BP Location: Left Arm, Patient Position: Sitting, Cuff Size: Large)   Pulse 77   Temp 98.6 F (37 C) (Oral)   Ht 5' 0.5" (1.537 m)   Wt 116 lb (52.6 kg)   SpO2 99%   BMI 22.28 kg/m   BP Readings from Last 3 Encounters:  02/14/23 118/72  02/11/23 130/70  02/09/23 (!) 142/71    Wt Readings from Last 3  Encounters:  02/14/23 116 lb (52.6 kg)  02/11/23 117 lb (53.1 kg)  02/01/23 116 lb (52.6 kg)    Physical Exam Constitutional:      General: She is not in acute distress.    Appearance: Normal appearance. She is well-developed.  HENT:     Head: Normocephalic.     Right Ear: External ear normal.     Left Ear: External ear normal.     Nose: Nose normal.  Eyes:     General:        Right eye: No discharge.        Left eye: No discharge.     Conjunctiva/sclera: Conjunctivae normal.     Pupils: Pupils are equal, round, and reactive to light.  Neck:     Thyroid: No thyromegaly.     Vascular: No JVD.     Trachea: No tracheal deviation.  Cardiovascular:     Rate and Rhythm: Normal rate and regular rhythm.     Heart sounds: Normal heart sounds.  Pulmonary:     Effort: No respiratory distress.     Breath sounds: No stridor. No wheezing.  Abdominal:     General: Bowel sounds are normal. There is no distension.     Palpations: Abdomen is soft. There is no mass.     Tenderness: There is no abdominal tenderness. There is no guarding or rebound.  Musculoskeletal:        General: No tenderness.     Cervical back: Normal range of motion and neck supple. No rigidity.  Lymphadenopathy:     Cervical: No cervical adenopathy.  Skin:    Findings: No erythema or rash.  Neurological:     Cranial Nerves: No cranial nerve deficit.     Motor: No abnormal muscle tone.     Coordination: Coordination normal.     Gait: Gait abnormal.     Deep Tendon Reflexes: Reflexes normal.  Psychiatric:        Behavior: Behavior normal.        Thought Content: Thought content normal.        Judgment: Judgment normal.   R lat hip and R buttock - pain on palpation Using a walker  Lab Results  Component Value Date   WBC 6.3 12/29/2022   HGB 12.7 12/29/2022   HCT 37.0 12/29/2022   PLT 324.0 12/29/2022   GLUCOSE 97 02/10/2023   CHOL 186 02/17/2022   TRIG 141.0 02/17/2022   HDL 59.30 02/17/2022    LDLDIRECT 98.0 08/13/2021   LDLCALC 99 02/17/2022   ALT 17 02/10/2023   AST 22 02/10/2023   NA 133 (L) 02/10/2023   K  4.0 02/10/2023   CL 96 02/10/2023   CREATININE 0.61 02/10/2023   BUN 15 02/10/2023   CO2 26 02/10/2023   TSH 1.71 02/10/2023   HGBA1C 5.5 06/13/2020    ECHOCARDIOGRAM COMPLETE  Result Date: 02/11/2023    ECHOCARDIOGRAM REPORT   Patient Name:   [name] Date of Exam: 02/11/2023 Medical Rec #:  [id number]     Height:       60.5 in Accession #:    [id number]    Weight:       116.0 lb Date of Birth:  12-21-1935      BSA:          1.490 m Patient Age:    38 years      BP:           133/76 mmHg Patient Gender: F             HR:           73 bpm. Exam Location:  Outpatient Procedure: 2D Echo, Color Doppler and Cardiac Doppler Indications:    I50.9* Heart failure (unspecified)  History:        Patient has prior history of Echocardiogram examinations, most                 recent 06/25/2022. Risk Factors:Dyslipidemia.  Sonographer:    Raquel Sarna Senior RDCS Referring Phys: [id number] DANIEL R BENSIMHON IMPRESSIONS  1. Left ventricular ejection fraction, by estimation, is 55 to 60%. The left ventricle has normal function. The left ventricle has no regional wall motion abnormalities. Left ventricular diastolic parameters are consistent with Grade I diastolic dysfunction (impaired relaxation).  2. Right ventricular systolic function is normal. The right ventricular size is normal. There is normal pulmonary artery systolic pressure.  3. The mitral valve is normal in structure. Mild mitral valve regurgitation. No evidence of mitral stenosis.  4. The aortic valve is tricuspid. There is mild calcification of the aortic valve. Aortic valve regurgitation is mild. Aortic valve sclerosis/calcification is present, without any evidence of aortic stenosis. Aortic regurgitation PHT measures 436 msec. Aortic valve area, by VTI measures 1.81 cm. Aortic valve mean gradient measures 5.0 mmHg. Aortic valve Vmax measures  1.45 m/s.  5. The inferior vena cava is normal in size with greater than 50% respiratory variability, suggesting right atrial pressure of 3 mmHg. FINDINGS  Left Ventricle: Left ventricular ejection fraction, by estimation, is 55 to 60%. The left ventricle has normal function. The left ventricle has no regional wall motion abnormalities. The left ventricular internal cavity size was normal in size. There is  no left ventricular hypertrophy. Left ventricular diastolic parameters are consistent with Grade I diastolic dysfunction (impaired relaxation). Right Ventricle: The right ventricular size is normal. No increase in right ventricular wall thickness. Right ventricular systolic function is normal. There is normal pulmonary artery systolic pressure. The tricuspid regurgitant velocity is 2.35 m/s, and  with an assumed right atrial pressure of 3 mmHg, the estimated right ventricular systolic pressure is 99991111 mmHg. Left Atrium: Left atrial size was normal in size. Right Atrium: Right atrial size was normal in size. Pericardium: There is no evidence of pericardial effusion. Mitral Valve: The mitral valve is normal in structure. Mild mitral valve regurgitation. No evidence of mitral valve stenosis. Tricuspid Valve: The tricuspid valve is normal in structure. Tricuspid valve regurgitation is not demonstrated. No evidence of tricuspid stenosis. Aortic Valve: The aortic valve is tricuspid. There is mild calcification of the aortic valve. Aortic valve regurgitation  is mild. Aortic regurgitation PHT measures 436 msec. Aortic valve sclerosis/calcification is present, without any evidence of aortic stenosis. Aortic valve mean gradient measures 5.0 mmHg. Aortic valve peak gradient measures 8.4 mmHg. Aortic valve area, by VTI measures 1.81 cm. Pulmonic Valve: The pulmonic valve was normal in structure. Pulmonic valve regurgitation is trivial. No evidence of pulmonic stenosis. Aorta: The aortic root is normal in size and structure.  Venous: The inferior vena cava is normal in size with greater than 50% respiratory variability, suggesting right atrial pressure of 3 mmHg. IAS/Shunts: No atrial level shunt detected by color flow Doppler.  LEFT VENTRICLE PLAX 2D LVIDd:         4.00 cm   Diastology LVIDs:         2.90 cm   LV e' medial:    5.44 cm/s LV PW:         0.80 cm   LV E/e' medial:  8.4 LV IVS:        0.70 cm   LV e' lateral:   4.90 cm/s LVOT diam:     1.95 cm   LV E/e' lateral: 9.3 LV SV:         51 LV SV Index:   34 LVOT Area:     2.99 cm  RIGHT VENTRICLE RV S prime:     13.50 cm/s TAPSE (M-mode): 1.6 cm LEFT ATRIUM             Index        RIGHT ATRIUM           Index LA diam:        2.60 cm 1.74 cm/m   RA Area:     13.40 cm LA Vol (A2C):   43.7 ml 29.33 ml/m  RA Volume:   30.70 ml  20.60 ml/m LA Vol (A4C):   25.6 ml 17.18 ml/m LA Biplane Vol: 33.1 ml 22.21 ml/m  AORTIC VALVE AV Area (Vmax):    1.78 cm AV Area (Vmean):   1.81 cm AV Area (VTI):     1.81 cm AV Vmax:           145.00 cm/s AV Vmean:          101.000 cm/s AV VTI:            0.282 m AV Peak Grad:      8.4 mmHg AV Mean Grad:      5.0 mmHg LVOT Vmax:         86.60 cm/s LVOT Vmean:        61.200 cm/s LVOT VTI:          0.171 m LVOT/AV VTI ratio: 0.61 AI PHT:            436 msec  AORTA Ao Root diam: 2.90 cm Ao Asc diam:  2.90 cm MITRAL VALVE                  TRICUSPID VALVE MV Area (PHT): 3.48 cm       TR Peak grad:   22.1 mmHg MV Decel Time: 218 msec       TR Vmax:        235.00 cm/s MR Peak grad:    114.5 mmHg MR Mean grad:    64.0 mmHg    SHUNTS MR Vmax:         535.00 cm/s  Systemic VTI:  0.17 m MR Vmean:        360.0 cm/s   Systemic  Diam: 1.95 cm MR PISA:         1.01 cm MR PISA Eff ROA: 7 mm MR PISA Radius:  0.40 cm MV E velocity: 45.50 cm/s MV A velocity: 85.10 cm/s MV E/A ratio:  0.53 Glori Bickers MD Electronically signed by Glori Bickers MD Signature Date/Time: 02/11/2023/2:06:19 PM    Final     Assessment & Plan:   Problem List Items Addressed This  Visit       Digestive   GERD    Chronic. Better on Gluten free diet Dexilant  Potential benefits of a long term PPI  use as well as potential risks  and complications were explained to the patient and were aknowledged. GERD wedge        Other   Low back pain    Worse [name] - [name] is seeing Dr Oneida Alar - appt tomorrow Started PT at [facility]. [name] is worse after PT. Pt did not sleep last night. Pain irradiated down the R foot. Using a walker now. Tramadol and Tylenol prn Depo-medrol 80 mg IM      Gait disorder - Primary     [name] - [name] is seeing Dr Oneida Alar - appt tomorrow Started PT at [facility]. [name] is worse after PT. Pt did not sleep last night. Pain irradiated down the R foot. Using a walker now.          No orders of the defined types were placed in this encounter.     Follow-up: Return in about 6 weeks (around 03/28/2023) for a follow-up visit.  Walker Kehr, MD

## 2023-02-14 NOTE — Assessment & Plan Note (Signed)
[name] - Sheniah is seeing Dr Oneida Alar - appt tomorrow Started PT at [facility]. [name] is worse after PT. Pt did not sleep last night. Pain irradiated down the R foot. Using a walker now.

## 2023-02-15 ENCOUNTER — Ambulatory Visit (INDEPENDENT_AMBULATORY_CARE_PROVIDER_SITE_OTHER): Payer: Medicare Other | Admitting: Sports Medicine

## 2023-02-15 ENCOUNTER — Encounter: Payer: Self-pay | Admitting: *Deleted

## 2023-02-15 VITALS — BP 146/70 | Ht 60.0 in | Wt 116.0 lb

## 2023-02-15 DIAGNOSIS — M5136 Other intervertebral disc degeneration, lumbar region: Secondary | ICD-10-CM | POA: Diagnosis not present

## 2023-02-15 MED ORDER — METHYLPREDNISOLONE ACETATE 80 MG/ML IJ SUSP
80.0000 mg | Freq: Once | INTRAMUSCULAR | Status: AC
  Administered 2023-02-15: 80 mg via INTRAMUSCULAR

## 2023-02-15 NOTE — Assessment & Plan Note (Signed)
With her significant lumbar degenerative disc disease I believe she has developed significant sciatica down her right leg She is not a candidate for aggressive therapy I agree with the pain medication changes made by Dr. Alain Marion Today we will give her an injection of 80 mg of Solu-Medrol IM to see if we can lessen the intensity of the sciatica  We took her out of physical therapy and rehabilitation exercises For the next 2 weeks she will work on easy motion to her lumbar spine and do short walks 4-5 times a day  I will see her for follow-up in 2 weeks

## 2023-02-15 NOTE — Progress Notes (Signed)
Chief complaint right buttocks pain and pain radiating down her right leg to the foot  Patient has been having some chronic low back and buttocks pain On review of her lumbar spine films she has significant degenerative loss, pseudo disc phenomenon at L4-5 and loss of chronic changes throughout the lumbar spine When seen by me in January for left SI joint pain she responded extremely well to some simple home exercises However the right hip pain and back pain did not seem primarily over the SI joint She did not respond to home exercises so we tried physical therapy Since the physical therapy she has had a lot more pain and does not feel that she can tolerate it well  She saw Dr. Alain Marion who changed her pain medicines to include Advil 2 followed by 2 Tylenol 2 hours later.  He did allow her to take an occasional tramadol if needed This is getting her by but she is needing to walk with a walker and still has quite a bit of pain What is different since the physical therapy is that it tends to radiate all the way down to her foot  Physical exam is a pleasant older female who looks in some discomfort BP (!) 146/70   Ht 5' (1.524 m)   Wt 116 lb (52.6 kg)   BMI 22.65 kg/m  Note blood pressure is rechecked and she will be followed by her primary care physician and the blood pressure today is while she is in pain  Physical exam reveals pain with 20 degrees of back extension and with about 20 degrees of back flexion Lateral bending to the right or left causes some pain in the right buttocks Rotation to a limited degree is not painful She walks with no sign of foot drop Great toe dorsiflexion, foot dorsiflexion, foot plantarflexion, eversion and inversion of the foot show that her strength is preserved Reflexes are maintained She feels that her right buttocks is tight and uncomfortable to palpation

## 2023-02-15 NOTE — Patient Instructions (Addendum)
Sorry you are in so much pain. You have sciatica. You can take 2 advil every 2 hours along with 2 tylenol. Take 1 tramadol for severe pain. You can take short walks 4-5 times a day as tolerated. Stop doing the hip exercises for now and work on easy lumbar motion. Forward bends, easy back and side to side movements. We did a steroid injection today that should hopefully help with your pain. Stop PT for now until you are feeling better.

## 2023-02-16 ENCOUNTER — Encounter (HOSPITAL_BASED_OUTPATIENT_CLINIC_OR_DEPARTMENT_OTHER): Payer: Medicare Other | Admitting: Physician Assistant

## 2023-02-16 DIAGNOSIS — L97811 Non-pressure chronic ulcer of other part of right lower leg limited to breakdown of skin: Secondary | ICD-10-CM | POA: Diagnosis not present

## 2023-02-16 DIAGNOSIS — I87311 Chronic venous hypertension (idiopathic) with ulcer of right lower extremity: Secondary | ICD-10-CM | POA: Diagnosis not present

## 2023-02-16 DIAGNOSIS — M810 Age-related osteoporosis without current pathological fracture: Secondary | ICD-10-CM | POA: Diagnosis not present

## 2023-02-16 DIAGNOSIS — M545 Low back pain, unspecified: Secondary | ICD-10-CM | POA: Diagnosis not present

## 2023-02-16 DIAGNOSIS — S80811A Abrasion, right lower leg, initial encounter: Secondary | ICD-10-CM | POA: Diagnosis not present

## 2023-02-16 DIAGNOSIS — G8929 Other chronic pain: Secondary | ICD-10-CM | POA: Diagnosis not present

## 2023-02-16 DIAGNOSIS — S80811D Abrasion, right lower leg, subsequent encounter: Secondary | ICD-10-CM | POA: Diagnosis not present

## 2023-02-17 ENCOUNTER — Telehealth: Payer: Self-pay | Admitting: Internal Medicine

## 2023-02-17 NOTE — Telephone Encounter (Signed)
Pt called in BP reading 180/90 transferred over to triage. Pt really wants a call back from the nurse because thinks its the pain medicine making her bp go up. Please call pt back with update

## 2023-02-17 NOTE — Progress Notes (Addendum)
CJ, BEECHER ([id number]) [id number] Page 1 of 6 Visit Report for 02/16/2023 Chief Complaint Document Details Patient Name: Date of Service: [name], [name] 02/16/2023 11:15 A M Medical Record Number: [id number] Patient Account Number: [id number] Date of Birth/Sex: Treating RN: June 06, 1936 (87 y.o. F) Primary Care Provider: [name] Other Clinician: Referring Provider: Treating Provider/Extender: [name], [name] in Treatment: 2 Information Obtained from: Patient Chief Complaint 02/03/2023; patient is here from the [facility] with an abrasion injury on her right lower leg Electronic Signature(s) Signed: 02/16/2023 11:46:15 AM By: [name] PA-C Entered By: [name] on 02/16/2023 11:46:15 -------------------------------------------------------------------------------- Debridement Details Patient Name: Date of Service: [name], [name] 02/16/2023 11:15 A M Medical Record Number: [id number] Patient Account Number: [id number] Date of Birth/Sex: Treating RN: 01-04-1936 (87 y.o. [name] Primary Care Provider: [name] Other Clinician: Referring Provider: Treating Provider/Extender: [name] in Treatment: 2 Debridement Performed for Assessment: Wound #1 Right,Anterior Lower Leg Performed By: Physician [name], PA Debridement Type: Debridement Level of Consciousness (Pre-procedure): Awake and Alert Pre-procedure Verification/Time Out Yes - 11:40 Taken: Start Time: 11:41 Pain Control: Lidocaine 5% topical ointment T Area Debrided (L x W): otal 5.7 (cm) x 2.5 (cm) = 14.25 (cm) Tissue and other material debrided: Viable, Non-Viable, Slough, Subcutaneous, Biofilm, Slough Level: Skin/Subcutaneous Tissue Debridement Description: Excisional Instrument: Curette Bleeding: Minimum Hemostasis Achieved: Pressure End Time:  11:53 Procedural Pain: 0 Post Procedural Pain: 0 Response to Treatment: Procedure was tolerated well Level of Consciousness (Post- Awake and Alert procedure): Post Debridement Measurements of Total Wound Length: (cm) 5.7 Width: (cm) 5.2 Depth: (cm) 0.1 Volume: (cm) 2.328 Character of Wound/Ulcer Post Debridement: Improved Post Procedure Diagnosis Same as Pre-procedure Notes Scribed for [employer] by Brenton Grills RN. Electronic Signature(s) Signed: 02/16/2023 6:05:31 PM By: [name] PA-C Signed: 03/17/2023 1:55:51 PM By: Oren Bracket, [name] ([id number]) [id number] Page 2 of 6 Entered By: Brenton Grills on 02/16/2023 11:56:16 -------------------------------------------------------------------------------- HPI Details Patient Name: Date of Service: [name], [name] 02/16/2023 11:15 A M Medical Record Number: [id number] Patient Account Number: [id number] Date of Birth/Sex: Treating RN: 1936-04-19 (87 y.o. F) Primary Care Provider: [name] Other Clinician: Referring Provider: Treating Provider/Extender: [name] in Treatment: 2 History of Present Illness HPI Description: ADMISSION 02/02/23 [name] is an 87 year old woman who lives in the [facility] the [facility]. She has been dealing with a leg wound since February 19 when she traumatized her right leg on a cleaning bucket. She has been using Bactroban and T and an Ace wrap. elfa Chronic low back pain, right hip pain, osteoporosis, hypertension, gait disease disorder she is not a diabetic. 02-16-2023 upon evaluation today patient appears to be doing excellent in regard to her wound although it does look a little bit dry overall I think we are still seeing some improvements here which is great news. Fortunately I do not see any evidence of active infection locally nor systemically which is great news. No fevers,  chills, nausea, vomiting, or diarrhea. Electronic Signature(s) Signed: 02/16/2023 1:39:04 PM By: [name] PA-C Entered By: [name] on 02/16/2023 13:39:04 -------------------------------------------------------------------------------- Physical Exam Details Patient Name: Date of Service: [name], [name] 02/16/2023 11:15 A M Medical Record Number: [id number] Patient Account Number: [id number] Date of Birth/Sex: Treating RN: [birth year] (87 y.o. F) Primary Care Provider: [name] Other  Clinician: Referring Provider: Treating Provider/Extender: [name] Plotnikov, Georgina Quint Weeks in Treatment: 2 Constitutional Well-nourished and well-hydrated in no acute distress. Respiratory normal breathing without difficulty. Psychiatric this patient is able to make decisions and demonstrates good insight into disease process. Alert and Oriented x 3. pleasant and cooperative. Notes Upon inspection patient's wound bed actually showed signs of good granulation epithelization at this point. Fortunately I do not see any evidence of active infection locally nor systemically which is great news. Electronic Signature(s) Signed: 02/16/2023 1:39:25 PM By: [name] PA-C Entered By: [name] on 02/16/2023 13:39:25 -------------------------------------------------------------------------------- Physician Orders Details Patient Name: Date of Service: [name], [name] 02/16/2023 11:15 A M Medical Record Number: [id number] Patient Account Number: [id number] Date of Birth/Sex: Treating RN: 1936/07/16 (87 y.o. [name] Primary Care Provider: [name] Other Clinician: Referring Provider: Treating Provider/Extender: [name], [name] in Treatment: 2 Verbal / Phone Orders: No TYSHERIA, VOGT ([id number]) [id number] Page 3 of 6 Diagnosis Coding ICD-10 Coding Code Description L97.811 Non-pressure  chronic ulcer of other part of right lower leg limited to breakdown of skin S80.811D Abrasion, right lower leg, subsequent encounter I87.311 Chronic venous hypertension (idiopathic) with ulcer of right lower extremity Follow-up Appointments ppointment in 1 week. Leonard Schwartz - Wednesday 02/23/23 at 1:30pm Return A ppointment in 2 weeks. Leonard Schwartz [facility] Room 9 03/02/2023 115pm Return A Other: - [facility]/ Shower/ Hygiene May shower and wash wound with soap and water. Edema Control - Lymphedema / SCD / Other Bilateral Lower Extremities Avoid standing for long periods of time. Patient to wear own compression stockings every day. - pt to order from [facility]. Exercise regularly Wound Treatment Wound #1 - Lower Leg Wound Laterality: Right, Anterior Cleanser: Soap and Water Every Other Day/15 Days Discharge Instructions: May shower and wash wound with dial antibacterial soap and water prior to dressing change. Peri-Wound Care: Skin Prep (Generic) Every Other Day/15 Days Discharge Instructions: Use skin prep as directed Prim Dressing: Xeroform Occlusive Gauze Dressing, 4x4 in Every Other Day/15 Days ary Discharge Instructions: Apply to wound bed as instructed Secondary Dressing: ALLEVYN Gentle Border, 5x5 (in/in) (Generic) Every Other Day/15 Days Discharge Instructions: Apply over primary dressing as directed. Electronic Signature(s) Signed: 02/16/2023 6:05:31 PM By: [name] PA-C Signed: 03/17/2023 1:55:51 PM By: Brenton Grills Entered By: Brenton Grills on 02/16/2023 12:00:09 -------------------------------------------------------------------------------- Problem List Details Patient Name: Date of Service: [name], Erna H. 02/16/2023 11:15 A M Medical Record Number: [id number] Patient Account Number: [id number] Date of Birth/Sex: Treating RN: 1936-03-17 (87 y.o. F) Primary Care Provider: [name] Other Clinician: Referring Provider: Treating Provider/Extender:  [name] in Treatment: 2 Active Problems ICD-10 Encounter Code Description Active Date MDM Diagnosis L97.811 Non-pressure chronic ulcer of other part of right lower leg limited to breakdown 02/02/2023 No Yes of skin S80.811D Abrasion, right lower leg, subsequent encounter 02/02/2023 No Yes I87.311 Chronic venous hypertension (idiopathic) with ulcer of right lower extremity 02/02/2023 No Yes Inactive Problems SHELBIE, MALLETTE ([id number]) [phone].pdf Page 4 of 6 Resolved Problems Electronic Signature(s) Signed: 02/16/2023 11:45:36 AM By: [name] PA-C Entered By: [name] on 02/16/2023 11:45:36 -------------------------------------------------------------------------------- Progress Note Details Patient Name: Date of Service: [name], [name] 02/16/2023 11:15 A M Medical Record Number: [id number] Patient Account Number: [id number] Date of Birth/Sex: Treating RN: May 07, 1936 (87 y.o. F) Primary Care Provider: [name] Other Clinician: Referring Provider: Treating Provider/Extender:  Stone III, Christos Mixson Plotnikov, Georgina Quint Weeks in Treatment: 2 Subjective Chief Complaint Information obtained from Patient 02/03/2023; patient is here from the [facility] with an abrasion injury on her right lower leg History of Present Illness (HPI) ADMISSION 02/02/23 Mrs. Redeker is an 87 year old woman who lives in the independent part of the [facility]. She has been dealing with a leg wound since February 19 when she traumatized her right leg on a cleaning bucket. She has been using Bactroban and T and an Ace wrap. elfa Chronic low back pain, right hip pain, osteoporosis, hypertension, gait disease disorder she is not a diabetic. 02-16-2023 upon evaluation today patient appears to be doing excellent in regard to her wound although it does look a little bit dry overall I think we are  still seeing some improvements here which is great news. Fortunately I do not see any evidence of active infection locally nor systemically which is great news. No fevers, chills, nausea, vomiting, or diarrhea. Objective Constitutional Well-nourished and well-hydrated in no acute distress. Vitals Time Taken: 11:37 AM, Temperature: 96 F, Pulse: 84 bpm, Respiratory Rate: 18 breaths/min, Blood Pressure: 166/78 mmHg. Respiratory normal breathing without difficulty. Psychiatric this patient is able to make decisions and demonstrates good insight into disease process. Alert and Oriented x 3. pleasant and cooperative. General Notes: Upon inspection patient's wound bed actually showed signs of good granulation epithelization at this point. Fortunately I do not see any evidence of active infection locally nor systemically which is great news. Integumentary (Hair, Skin) Wound #1 status is Open. Original cause of wound was Skin T ear/Laceration. The date acquired was: 01/10/2023. The wound has been in treatment 2 weeks. The wound is located on the Right,Anterior Lower Leg. The wound measures 5.7cm length x 5.2cm width x 0.1cm depth; 23.279cm^2 area and 2.328cm^3 volume. There is Fat Layer (Subcutaneous Tissue) exposed. There is no tunneling or undermining noted. There is a medium amount of serosanguineous drainage noted. The wound margin is distinct with the outline attached to the wound base. There is large (67-100%) red granulation within the wound bed. There is a small (1-33%) amount of necrotic tissue within the wound bed including Adherent Slough. The periwound skin appearance exhibited: Scarring. The periwound skin appearance did not exhibit: Callus, Crepitus, Excoriation, Induration, Rash, Dry/Scaly, Maceration, Atrophie Blanche, Cyanosis, Ecchymosis, Hemosiderin Staining, Mottled, Pallor, Rubor, Erythema. Periwound temperature was noted as No Abnormality. The periwound has tenderness on  palpation. Assessment Active Problems ICD-10 Non-pressure chronic ulcer of other part of right lower leg limited to breakdown of skin CEILI, LAFORGIA ([id number]) [id number] Page 5 of 6 Abrasion, right lower leg, subsequent encounter Chronic venous hypertension (idiopathic) with ulcer of right lower extremity Procedures Wound #1 Pre-procedure diagnosis of Wound #1 is an Abrasion located on the Right,Anterior Lower Leg . There was a Excisional Skin/Subcutaneous Tissue Debridement with a total area of 14.25 sq cm performed by [name], PA. With the following instrument(s): Curette to remove Viable and Non-Viable tissue/material. Material removed includes Subcutaneous Tissue, Slough, and Biofilm after achieving pain control using Lidocaine 5% topical ointment. No specimens were taken. A time out was conducted at 11:40, prior to the start of the procedure. A Minimum amount of bleeding was controlled with Pressure. The procedure was tolerated well with a pain level of 0 throughout and a pain level of 0 following the procedure. Post Debridement Measurements: 5.7cm length x 5.2cm width x 0.1cm depth; 2.328cm^3 volume. Character of Wound/Ulcer Post Debridement is improved.  Post procedure Diagnosis Wound #1: Same as Pre-Procedure General Notes: Scribed for [facility] by Brenton Grills RN.[name] Plan Follow-up Appointments: Return Appointment in 1 week. Leonard Schwartz - Wednesday 02/23/23 at 1:30pm Return Appointment in 2 weeks. Leonard Schwartz [facility] Room 9 03/02/2023 115pm Other: - [facility]/ Shower/ Hygiene: May shower and wash wound with soap and water. Edema Control - Lymphedema / SCD / Other: Avoid standing for long periods of time. Patient to wear own compression stockings every day. - pt to order from [facility]. Exercise regularly WOUND #1: - Lower Leg Wound Laterality: Right, Anterior Cleanser: Soap and Water Every Other Day/15 Days Discharge Instructions: May  shower and wash wound with dial antibacterial soap and water prior to dressing change. Peri-Wound Care: Skin Prep (Generic) Every Other Day/15 Days Discharge Instructions: Use skin prep as directed Prim Dressing: Xeroform Occlusive Gauze Dressing, 4x4 in Every Other Day/15 Days ary Discharge Instructions: Apply to wound bed as instructed Secondary Dressing: ALLEVYN Gentle Border, 5x5 (in/in) (Generic) Every Other Day/15 Days Discharge Instructions: Apply over primary dressing as directed. 1. I am good recommend that we have the patient continue to monitor for any signs of infection or worsening. Overall I do believe that we are headed in the right direction. 2. I am also can recommend that the patient should continue with dressings although I would recommend 3 times a week dressing changes using Xeroform instead of the Hydrofera Blue I think the [facility] she is keeping this a little bit too dry. 3. I am also can recommend that the patient should continue to monitor again for any excessive swelling right now I do not see anything that looks too swollen regular continue as such with the current plan. We will see patient back for reevaluation in 1 week here in the clinic. If anything worsens or changes patient will contact our office for additional recommendations. Electronic Signature(s) Signed: 02/16/2023 1:40:09 PM By: [name] PA-C Entered By: [name] on 02/16/2023 13:40:09 -------------------------------------------------------------------------------- SuperBill Details Patient Name: Date of Service: [name], Alexsandria H. 02/16/2023 Medical Record Number: [id number] Patient Account Number: [id number] Date of Birth/Sex: Treating RN: Sep 16, 1936 (87 y.o. [name] Primary Care Provider: [name] Other Clinician: Referring Provider: Treating Provider/Extender: [name] in Treatment: 2 Diagnosis Coding ICD-10 Codes Code  Description [id number] Non-pressure chronic ulcer of other part of right lower leg limited to breakdown of skin ROSELEE, TAYLOE ([id number]) [phone].pdf Page 6 of 6 S80.811D Abrasion, right lower leg, subsequent encounter I87.311 Chronic venous hypertension (idiopathic) with ulcer of right lower extremity Facility Procedures : CPT4 Code: [id number] Description: 11042 - DEB SUBQ TISSUE 20 SQ CM/< ICD-10 Diagnosis Description L97.811 Non-pressure chronic ulcer of other part of right lower leg limited to breakdow Modifier: n of skin Quantity: 1 Physician Procedures : CPT4 Code Description Modifier [id number] 11042 - WC PHYS SUBQ TISS 20 SQ CM ICD-10 Diagnosis Description L97.811 Non-pressure chronic ulcer of other part of right lower leg limited to breakdown of skin Quantity: 1 Electronic Signature(s) Signed: 02/16/2023 1:40:27 PM By: [name] PA-C Entered By: [name] on 02/16/2023 13:40:27

## 2023-02-17 NOTE — Telephone Encounter (Signed)
Triage nurse called stating that the patient's blood pressure has been high and that the patient has questions about the medications she is taking. Patient is requesting a call from Dr. Camila Li today so she can ask him a few questions.  Best callback number is [phone].

## 2023-02-18 NOTE — Telephone Encounter (Signed)
Patient called back and would like someone to call her today

## 2023-02-18 NOTE — Telephone Encounter (Signed)
Called pt she states she check her BP this am it was 170/100 wanted to know can she take another amlodipine. Inform pt per her chart the amlodipine was rx by her cardiologist, and she can contact them concerning her med/BP since MD is out of the office. Pt agreed will contact cardiologist../lmb

## 2023-02-19 ENCOUNTER — Encounter: Payer: Self-pay | Admitting: Internal Medicine

## 2023-02-19 NOTE — Telephone Encounter (Signed)
Ibuprofen or Aleve may potentially make blood pressure go up -please stop.  Take amlodipine 2.5 mg twice a day if blood pressures greater than 150/90 Thanks .

## 2023-02-19 NOTE — Telephone Encounter (Signed)
I sent [name] a MyChart note.  Thank you

## 2023-02-21 ENCOUNTER — Telehealth: Payer: Self-pay | Admitting: Internal Medicine

## 2023-02-21 NOTE — Telephone Encounter (Signed)
Pt has asked that if her BP is 150/70 should she still take the amlodipine?  Pt has also asked that back in January you informed her to take the amlodipine if BP went up to the 170's/90's. She wants to know is this still something she should do or should she do it at the 150/90 mark?

## 2023-02-21 NOTE — Telephone Encounter (Signed)
Patient would like clarification of the dosage for her amLODipine (NORVASC) 2.5 MG tablet . She would like a call back at [phone].  Below are her most recent blood pressure checks  140/78 on 02/21/23 am 153/84 on 02/20/23 pm

## 2023-02-22 ENCOUNTER — Other Ambulatory Visit: Payer: Self-pay | Admitting: Internal Medicine

## 2023-02-22 NOTE — Telephone Encounter (Signed)
Amlodipine 2.5 mg daily

## 2023-02-22 NOTE — Telephone Encounter (Signed)
Yes, please take amlodipine.  Thanks

## 2023-02-23 ENCOUNTER — Telehealth: Payer: Self-pay

## 2023-02-23 ENCOUNTER — Ambulatory Visit (INDEPENDENT_AMBULATORY_CARE_PROVIDER_SITE_OTHER): Payer: Medicare Other | Admitting: Internal Medicine

## 2023-02-23 ENCOUNTER — Encounter (HOSPITAL_BASED_OUTPATIENT_CLINIC_OR_DEPARTMENT_OTHER): Payer: Medicare Other | Admitting: Physician Assistant

## 2023-02-23 ENCOUNTER — Other Ambulatory Visit (HOSPITAL_COMMUNITY): Payer: Self-pay

## 2023-02-23 ENCOUNTER — Encounter: Payer: Self-pay | Admitting: Internal Medicine

## 2023-02-23 VITALS — BP 130/80 | HR 76 | Temp 98.3°F | Ht 60.0 in | Wt 115.0 lb

## 2023-02-23 DIAGNOSIS — S80811D Abrasion, right lower leg, subsequent encounter: Secondary | ICD-10-CM | POA: Diagnosis not present

## 2023-02-23 DIAGNOSIS — R3915 Urgency of urination: Secondary | ICD-10-CM

## 2023-02-23 DIAGNOSIS — M545 Low back pain, unspecified: Secondary | ICD-10-CM | POA: Diagnosis not present

## 2023-02-23 DIAGNOSIS — L97811 Non-pressure chronic ulcer of other part of right lower leg limited to breakdown of skin: Secondary | ICD-10-CM | POA: Diagnosis not present

## 2023-02-23 DIAGNOSIS — I10 Essential (primary) hypertension: Secondary | ICD-10-CM | POA: Diagnosis not present

## 2023-02-23 DIAGNOSIS — E034 Atrophy of thyroid (acquired): Secondary | ICD-10-CM | POA: Diagnosis not present

## 2023-02-23 DIAGNOSIS — G8929 Other chronic pain: Secondary | ICD-10-CM | POA: Diagnosis not present

## 2023-02-23 DIAGNOSIS — R413 Other amnesia: Secondary | ICD-10-CM

## 2023-02-23 DIAGNOSIS — S81811A Laceration without foreign body, right lower leg, initial encounter: Secondary | ICD-10-CM | POA: Diagnosis not present

## 2023-02-23 DIAGNOSIS — M810 Age-related osteoporosis without current pathological fracture: Secondary | ICD-10-CM | POA: Diagnosis not present

## 2023-02-23 DIAGNOSIS — I87311 Chronic venous hypertension (idiopathic) with ulcer of right lower extremity: Secondary | ICD-10-CM | POA: Diagnosis not present

## 2023-02-23 MED ORDER — TRAMADOL HCL 50 MG PO TABS: 50.0000 mg | ORAL_TABLET | Freq: Four times a day (QID) | ORAL | 2 refills | Status: DC | PRN

## 2023-02-23 MED ORDER — AMLODIPINE BESYLATE 2.5 MG PO TABS: 2.5000 mg | ORAL_TABLET | Freq: Every day | ORAL | 11 refills | Status: DC

## 2023-02-23 NOTE — Progress Notes (Deleted)
Office Visit Note  Patient: [name]             Date of Birth: 09-01-1936           MRN: [id number]             PCP: Cassandria Anger, MD Referring: Cassandria Anger, MD Visit Date: 03/09/2023 Occupation: @GUAROCC @  Subjective:  No chief complaint on file.   History of Present Illness: [name] is a 87 y.o. female ***     Activities of Daily Living:  Patient reports morning stiffness for *** {minute/hour:19697}.   Patient {ACTIONS;DENIES/REPORTS:21021675::"Denies"} nocturnal pain.  Difficulty dressing/grooming: {ACTIONS;DENIES/REPORTS:21021675::"Denies"} Difficulty climbing stairs: {ACTIONS;DENIES/REPORTS:21021675::"Denies"} Difficulty getting out of chair: {ACTIONS;DENIES/REPORTS:21021675::"Denies"} Difficulty using hands for taps, buttons, cutlery, and/or writing: {ACTIONS;DENIES/REPORTS:21021675::"Denies"}  No Rheumatology ROS completed.   PMFS History:  Patient Active Problem List   Diagnosis Date Noted   Leg wound, right, initial encounter 01/17/2023   Pain of right sacroiliac joint 01/14/2023   Lumbar degenerative disc disease 12/21/2022   Chronic left SI joint pain 11/30/2022   Inflammation of sacroiliac joint ([place]) 11/16/2022   Stool incontinence 11/16/2022   Memory impairment 11/08/2022   Grief 10/06/2022   History of basal cell carcinoma 09/29/2022   Open forehead wound 07/21/2022   Stress at home 07/21/2022   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   Gait disorder 02/28/2022   History of malignant neoplasm of skin 02/22/2022   Melanocytic nevi of trunk 02/22/2022   Seborrheic dermatitis 02/22/2022   Urticaria 02/22/2022   Irregular bowel habits 11/18/2021   Scalp laceration, sequela 11/02/2021   Concussion 10/29/2021   Scalp itch 08/19/2021   Allergic rhinitis due to animal (cat) (dog) hair and dander 08/07/2021   Allergic rhinitis due to pollen 08/07/2021   Mild intermittent asthma 08/07/2021   Closed fracture of first cervical  vertebra ([place]) 08/07/2021   Closed fracture of second cervical vertebra ([place]) 08/07/2021   Cervical spine instability 05/15/2021   Closed 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 05/13/2021   Cervical spinal cord compression ([place]) 05/04/2021   Fall against object 03/25/2021   Hematoma of face, initial encounter 03/25/2021   Degenerative disc disease, cervical 03/11/2021   Spondylolisthesis, cervical region 03/11/2021   Trochanteric bursitis of right hip 09/22/2020   Elevated blood-pressure reading, without diagnosis of hypertension 07/14/2020   Meningioma ([place]) 03/25/2020   Arthralgia 03/18/2020   RUQ pain 01/02/2020   Cholelithiasis 09/09/2019   Ear pain, bilateral 03/21/2019   Cervical spondylosis 01/05/2019   Neck pain 12/26/2018   Acute pain of left shoulder 12/26/2018   Anxiety 08/22/2018   Contact dermatitis and eczema 06/14/2018   Constipation 11/11/2016   Low back pain 11/10/2016   Leg abrasion 08/12/2016   Contusion of right knee 08/12/2016   Sinusitis, chronic 08/06/2016   Rash and nonspecific skin eruption 04/20/2016   Aortic valve sclerosis 03/12/2015   Contusion of left chest wall 01/09/2015   Fatigue 09/08/2014   Bladder pain 08/19/2014   Increased frequency of urination 08/19/2014   Urinary urgency 08/19/2014   Postherpetic neuralgia 10/02/2013   Impacted cerumen of right ear 07/16/2013   Headache 07/10/2013   Aortic stenosis 04/19/2013   Insomnia 01/24/2013   Cough 08/07/2012   Upper respiratory infection 08/03/2012   Pruritus of skin 03/11/2012   MVP (mitral valve prolapse)    Multiple thyroid nodules 10/04/2011   Herpes zoster 09/07/2011   Essential hypertension, benign 07/27/2011   Atrophic vaginitis    Well adult exam 03/15/2011  Hyperlipemia, mixed 03/15/2011   OAB (overactive bladder) 09/02/2010   Hypothyroidism 03/03/2010   CALF PAIN, LEFT 10/27/2009   Belching 07/21/2009   EAR PAIN 12/19/2008   Allergic rhinitis  12/19/2008   Irritable bowel syndrome 09/04/2008   PERSONAL HX COLONIC POLYPS 09/04/2008   GASTRITIS, CHRONIC 09/03/2008   DUODENITIS, WITHOUT HEMORRHAGE 09/03/2008   TIBIALIS TENDINITIS 03/20/2008   Osteoarthritis 02/25/2008   SWEATING 02/21/2008   LACTOSE INTOLERANCE 11/20/2007   Dyslipidemia 11/20/2007   GERD 11/20/2007   Osteoporosis 11/20/2007    Past Medical History:  Diagnosis Date   Allergic rhinitis    Asthma    Atrophic vaginitis    Baker's cyst    Left-Dr. Aluisio   Cataract    Dr. Katy Fitch   CHF (congestive heart failure) (HCC)    Colon polyps    Fibroid    Gastritis    chronic   GERD (gastroesophageal reflux disease)    Heart murmur    Hemorrhoids    Hyperlipidemia    Hypothyroidism    IBS (irritable bowel syndrome)    Lumbar spondylosis    MVP (mitral valve prolapse)    Antibiotics required for dental procedures   OA (osteoarthritis)    Osteoporosis 06/2018   T score -2.2 stable on Prolia   Overactive bladder    Sacroiliitis (HCC)    Thyroid disease    Thyroid nodule    Torn meniscus    bilateral    Family History  Problem Relation Age of Onset   Heart disease Mother    Hypertension Mother    Osteoporosis Mother    Congestive Heart Failure Mother    Pancreatic cancer Father    Heart attack Brother    Drug abuse Brother        over dose    Healthy Son    Healthy Son    Colon cancer Neg Hx    Colon polyps Neg Hx    Rectal cancer Neg Hx    Stomach cancer Neg Hx    Past Surgical History:  Procedure Laterality Date   CATARACT EXTRACTION, BILATERAL     EYE SURGERY     POSTERIOR CERVICAL FUSION/FORAMINOTOMY N/A 05/15/2021   Procedure: Cervical One Laminectomy with resection of cyst, Fixation from Occiput to Cervical Four;  Surgeon: Erline Levine, MD;  Location: [facility];  Service: Neurosurgery;  Laterality: N/A;   TONSILLECTOMY     Social History   Social History Narrative   Regular exercise-yesDaily caffeine use   Right handed   Lives alone    retired   Immunization History  Administered Date(s) Administered   COVID-19, mRNA, vaccine(Comirnaty)12 years and older 09/29/2022   Fluad Quad(high Dose 65+) 08/04/2019, 09/16/2021, 09/12/2022   Influenza Whole 09/29/2007, 08/21/2008, 09/02/2010, 07/30/2012   Influenza, High Dose Seasonal PF 09/03/2013, 02/19/2015, 11/12/2015, 08/06/2016, 11/11/2016, 08/20/2017, 08/31/2017, 09/20/2018, 09/12/2019   Influenza,inj,Quad PF,6+ Mos 12/17/2014, 07/30/2015   PFIZER(Purple Top)SARS-COV-2 Vaccination 12/12/2019, 12/31/2019, 07/29/2020, 07/29/2020, 07/31/2020   Pfizer Covid-19 Vaccine Bivalent Booster 45yrs & up 08/21/2021   Pneumococcal Conjugate-13 01/09/2014   Pneumococcal Polysaccharide-23 09/26/2006, 08/12/2015, 11/11/2016, 08/31/2017, 09/10/2020, 09/10/2021   Td 04/14/2010   Tdap 06/18/2020   Typhoid Inactivated 02/23/2013   Zoster, Live 12/09/2006     Objective: Vital Signs: There were no vitals taken for this visit.   Physical Exam   Musculoskeletal Exam: ***  CDAI Exam: CDAI Score: -- Patient Global: --; Provider Global: -- Swollen: --; Tender: -- Joint Exam 03/09/2023   No joint exam has been documented  for this visit   There is currently no information documented on the homunculus. Go to the Rheumatology activity and complete the homunculus joint exam.  Investigation: No additional findings.  Imaging: ECHOCARDIOGRAM COMPLETE  Result Date: 02/11/2023    ECHOCARDIOGRAM REPORT   Patient Name:   PAYZLIE MCCREEDY Date of Exam: 02/11/2023 Medical Rec #:  [id number]     Height:       60.5 in Accession #:    [id number]    Weight:       116.0 lb Date of Birth:  1936-07-07      BSA:          1.490 m Patient Age:    56 years      BP:           133/76 mmHg Patient Gender: F             HR:           73 bpm. Exam Location:  Outpatient Procedure: 2D Echo, Color Doppler and Cardiac Doppler Indications:    I50.9* Heart failure (unspecified)  History:        Patient has prior history of  Echocardiogram examinations, most                 recent 06/25/2022. Risk Factors:Dyslipidemia.  Sonographer:    Raquel Sarna Senior RDCS Referring Phys: [id number] DANIEL R BENSIMHON IMPRESSIONS  1. Left ventricular ejection fraction, by estimation, is 55 to 60%. The left ventricle has normal function. The left ventricle has no regional wall motion abnormalities. Left ventricular diastolic parameters are consistent with Grade I diastolic dysfunction (impaired relaxation).  2. Right ventricular systolic function is normal. The right ventricular size is normal. There is normal pulmonary artery systolic pressure.  3. The mitral valve is normal in structure. Mild mitral valve regurgitation. No evidence of mitral stenosis.  4. The aortic valve is tricuspid. There is mild calcification of the aortic valve. Aortic valve regurgitation is mild. Aortic valve sclerosis/calcification is present, without any evidence of aortic stenosis. Aortic regurgitation PHT measures 436 msec. Aortic valve area, by VTI measures 1.81 cm. Aortic valve mean gradient measures 5.0 mmHg. Aortic valve Vmax measures 1.45 m/s.  5. The inferior vena cava is normal in size with greater than 50% respiratory variability, suggesting right atrial pressure of 3 mmHg. FINDINGS  Left Ventricle: Left ventricular ejection fraction, by estimation, is 55 to 60%. The left ventricle has normal function. The left ventricle has no regional wall motion abnormalities. The left ventricular internal cavity size was normal in size. There is  no left ventricular hypertrophy. Left ventricular diastolic parameters are consistent with Grade I diastolic dysfunction (impaired relaxation). Right Ventricle: The right ventricular size is normal. No increase in right ventricular wall thickness. Right ventricular systolic function is normal. There is normal pulmonary artery systolic pressure. The tricuspid regurgitant velocity is 2.35 m/s, and  with an assumed right atrial pressure of 3 mmHg,  the estimated right ventricular systolic pressure is [id number] mmHg. Left Atrium: Left atrial size was normal in size. Right Atrium: Right atrial size was normal in size. Pericardium: There is no evidence of pericardial effusion. Mitral Valve: The mitral valve is normal in structure. Mild mitral valve regurgitation. No evidence of mitral valve stenosis. Tricuspid Valve: The tricuspid valve is normal in structure. Tricuspid valve regurgitation is not demonstrated. No evidence of tricuspid stenosis. Aortic Valve: The aortic valve is tricuspid. There is mild calcification of the aortic valve. Aortic valve regurgitation is mild.  Aortic regurgitation PHT measures 436 msec. Aortic valve sclerosis/calcification is present, without any evidence of aortic stenosis. Aortic valve mean gradient measures 5.0 mmHg. Aortic valve peak gradient measures 8.4 mmHg. Aortic valve area, by VTI measures 1.81 cm. Pulmonic Valve: The pulmonic valve was normal in structure. Pulmonic valve regurgitation is trivial. No evidence of pulmonic stenosis. Aorta: The aortic root is normal in size and structure. Venous: The inferior vena cava is normal in size with greater than 50% respiratory variability, suggesting right atrial pressure of 3 mmHg. IAS/Shunts: No atrial level shunt detected by color flow Doppler.  LEFT VENTRICLE PLAX 2D LVIDd:         4.00 cm   Diastology LVIDs:         2.90 cm   LV e' medial:    5.44 cm/s LV PW:         0.80 cm   LV E/e' medial:  8.4 LV IVS:        0.70 cm   LV e' lateral:   4.90 cm/s LVOT diam:     1.95 cm   LV E/e' lateral: 9.3 LV SV:         51 LV SV Index:   34 LVOT Area:     2.99 cm  RIGHT VENTRICLE RV S prime:     13.50 cm/s TAPSE (M-mode): 1.6 cm LEFT ATRIUM             Index        RIGHT ATRIUM           Index LA diam:        2.60 cm 1.74 cm/m   RA Area:     13.40 cm LA Vol (A2C):   43.7 ml 29.33 ml/m  RA Volume:   30.70 ml  20.60 ml/m LA Vol (A4C):   25.6 ml 17.18 ml/m LA Biplane Vol: 33.1 ml 22.21 ml/m   AORTIC VALVE AV Area (Vmax):    1.78 cm AV Area (Vmean):   1.81 cm AV Area (VTI):     1.81 cm AV Vmax:           145.00 cm/s AV Vmean:          101.000 cm/s AV VTI:            0.282 m AV Peak Grad:      8.4 mmHg AV Mean Grad:      5.0 mmHg LVOT Vmax:         86.60 cm/s LVOT Vmean:        61.200 cm/s LVOT VTI:          0.171 m LVOT/AV VTI ratio: 0.61 AI PHT:            436 msec  AORTA Ao Root diam: 2.90 cm Ao Asc diam:  2.90 cm MITRAL VALVE                  TRICUSPID VALVE MV Area (PHT): 3.48 cm       TR Peak grad:   22.1 mmHg MV Decel Time: 218 msec       TR Vmax:        235.00 cm/s MR Peak grad:    114.5 mmHg MR Mean grad:    64.0 mmHg    SHUNTS MR Vmax:         535.00 cm/s  Systemic VTI:  0.17 m MR Vmean:        360.0 cm/s   Systemic Diam: 1.95  cm MR PISA:         1.01 cm MR PISA Eff ROA: 7 mm MR PISA Radius:  0.40 cm MV E velocity: 45.50 cm/s MV A velocity: 85.10 cm/s MV E/A ratio:  0.53 Glori Bickers MD Electronically signed by Glori Bickers MD Signature Date/Time: 02/11/2023/2:06:19 PM    Final    MM 3D SCREEN BREAST BILATERAL  Result Date: 02/10/2023 CLINICAL DATA:  Screening. EXAM: DIGITAL SCREENING BILATERAL MAMMOGRAM WITH TOMOSYNTHESIS AND CAD TECHNIQUE: Bilateral screening digital craniocaudal and mediolateral oblique mammograms were obtained. Bilateral screening digital breast tomosynthesis was performed. The images were evaluated with computer-aided detection. COMPARISON:  Previous exam(s). ACR Breast Density Category b: There are scattered areas of fibroglandular density. FINDINGS: There are no findings suspicious for malignancy. IMPRESSION: No mammographic evidence of malignancy. A result letter of this screening mammogram will be mailed directly to the patient. RECOMMENDATION: Screening mammogram in one year. (Code:SM-B-01Y) BI-RADS CATEGORY  1: Negative. Electronically Signed   By: Margarette Canada M.D.   On: 02/10/2023 11:16   DG HIP UNILAT WITH PELVIS 2-3 VIEWS RIGHT  Result Date:  02/02/2023 CLINICAL DATA:  Right hip pain following a fall 1 week ago, initial encounter EXAM: DG HIP (WITH OR WITHOUT PELVIS) 2-V RIGHT COMPARISON:  None Available. FINDINGS: Visualized pelvic ring is intact. Degenerative changes of the right hip joint are noted. No acute fracture or dislocation is seen. No soft tissue abnormality is noted. IMPRESSION: No acute abnormality noted. Electronically Signed   By: Inez Catalina M.D.   On: 02/02/2023 20:55   DG Lumbar Spine 2-3 Views  Result Date: 02/02/2023 CLINICAL DATA:  Pain EXAM: LUMBAR SPINE - 2-3 VIEW COMPARISON:  None Available. FINDINGS: Grade 1 anterolisthesis of L4 versus L5. Grade 1 retrolisthesis of L1 versus L2 and L2 versus L3. No other malalignment. Multilevel degenerative disc disease with loss of disc height at multiple levels and vacuum disc phenomena at multiple levels. Severe lower lumbar facet degenerative changes are noted. Calcified atherosclerotic changes in the abdominal aorta and iliac vessels. IMPRESSION: 1. Degenerative changes as above. 2. Calcified atherosclerotic changes in the abdominal aorta and iliac vessels. Electronically Signed   By: Dorise Bullion III M.D.   On: 02/02/2023 10:31    Recent Labs: Lab Results  Component Value Date   WBC 6.3 12/29/2022   HGB 12.7 12/29/2022   PLT 324.0 12/29/2022   NA 133 (L) 02/10/2023   K 4.0 02/10/2023   CL 96 02/10/2023   CO2 26 02/10/2023   GLUCOSE 97 02/10/2023   BUN 15 02/10/2023   CREATININE 0.61 02/10/2023   BILITOT 0.4 02/10/2023   ALKPHOS 42 02/10/2023   AST 22 02/10/2023   ALT 17 02/10/2023   PROT 7.7 02/10/2023   ALBUMIN 4.3 02/10/2023   CALCIUM 9.7 02/10/2023   GFRAA 87 07/17/2020    Speciality Comments: No specialty comments available.  Procedures:  No procedures performed Allergies: Bee venom, Bacitracin-polymyxin b, Clarithromycin, Doxycycline, Neosporin [neomycin-bacitracin zn-polymyx], and Hydrocortisone   Assessment / Plan:     Visit Diagnoses: Primary  osteoarthritis of both hands  Primary osteoarthritis of both feet  DDD (degenerative disc disease), cervical  C1-C2 instability  DDD (degenerative disc disease), lumbar  Chronic SI joint pain  Trochanteric bursitis of both hips  Age-related osteoporosis without current pathological fracture  Essential hypertension, benign  History of gastroesophageal reflux (GERD)  History of hyperlipidemia  Hypothyroidism due to acquired atrophy of thyroid  Gait instability  MVP (mitral valve prolapse)  Aortic valve  sclerosis  Hx of colonic polyps  Multiple thyroid nodules  History of IBS  Orders: No orders of the defined types were placed in this encounter.  No orders of the defined types were placed in this encounter.   Face-to-face time spent with patient was *** minutes. Greater than 50% of time was spent in counseling and coordination of care.  Follow-Up Instructions: No follow-ups on file.   Ofilia Neas, PA-C  Note - This record has been created using Dragon software.  Chart creation errors have been sought, but may not always  have been located. Such creation errors do not reflect on  the standard of medical care.

## 2023-02-23 NOTE — Assessment & Plan Note (Signed)
Elevated BP Reduce salt intake Take Norvasc 2.5 mg daily  SBP 140-150 at home

## 2023-02-23 NOTE — Telephone Encounter (Signed)
Patient Advocate Encounter   Received notification from [employer] Medicare Part D that prior authorization is required for Memantine HCl 5MG  tablets   Submitted: 02-23-2023 Key  [id number]  Status is pending

## 2023-02-23 NOTE — Telephone Encounter (Signed)
Notified pt w/MD response.../lmb 

## 2023-02-23 NOTE — Progress Notes (Signed)
Subjective:  Patient ID: [name], female    DOB: 01/13/1936  Age: 87 y.o. MRN: [id number]  CC: Hypertension (Elevated BP, discuss BP meds)   HPI RASHEENA TRETTIN presents for SBP 140-150 at home Not taking Amlodipine, OAB, [name]  Outpatient Medications Prior to Visit  Medication Sig Dispense Refill   acetaminophen (TYLENOL) 500 MG tablet Take 1,000 mg by mouth every 6 (six) hours as needed for mild pain.     albuterol (VENTOLIN HFA) 108 (90 Base) MCG/ACT inhaler Take 2 puffs every 4-6 hours as needed     Azelastine HCl 0.15 % SOLN Place into both nostrils. Place into both nostrils.     Calcium Carbonate-Vitamin D (CALCIUM CARBONATE W/VITAMIN D PO) Take 1 tablet by mouth daily.     desonide (DESOWEN) 0.05 % cream Apply 1 application topically as needed.     diclofenac Sodium (VOLTAREN) 1 % GEL APPLY 4 GRAMS 4 TIMES DAILY. 200 g 3   EPINEPHrine (EPIPEN 2-PAK) 0.3 mg/0.3 mL IJ SOAJ injection Inject 0.3 mLs (0.3 mg total) into the muscle as needed. 1 Device 1   famotidine (PEPCID) 40 MG tablet Take 1 tablet (40 mg total) by mouth at bedtime. NEEDS OFFICE VISIT FOR ADDITIONAL REFILLS 30 tablet 2   Gauze Pads & Dressings (TELFA ADHESIVE DRESSING) 3"X4" PADS Use qd 20 each 0   ipratropium (ATROVENT) 0.06 % nasal spray Place 1 spray into the nose 3 (three) times daily as needed for rhinitis. 15 mL 0   levocetirizine (XYZAL) 5 MG tablet Take 5 mg by mouth every evening.     levothyroxine (SYNTHROID) 50 MCG tablet TAKE ONE TABLET BY MOUTH DAILY BEFORE BREAKFAST 30 tablet 5   LORazepam (ATIVAN) 1 MG tablet Take 2-3 tablets at bedtime and 1-2 tablets in the daytime as needed. 150 tablet 2   memantine (NAMENDA) 5 MG tablet Take 5 mg by mouth 2 (two) times daily.     montelukast (SINGULAIR) 10 MG tablet Take 1 tablet (10 mg total) by mouth at bedtime. 90 tablet 1   polyethylene glycol powder (GLYCOLAX/MIRALAX) 17 GM/SCOOP powder Take 17 g by mouth 2 (two) times daily as needed for moderate  constipation. 500 g 3   simvastatin (ZOCOR) 40 MG tablet TAKE ONE TABLET ONCE DAILY 90 tablet 3   triamcinolone (NASACORT) 55 MCG/ACT AERO nasal inhaler Place 2 sprays into the nose as needed.     VESICARE 10 MG tablet Take 1 tablet (10 mg total) by mouth daily as needed (bladder). 90 tablet 3   amLODipine (NORVASC) 2.5 MG tablet Take 1 tablet (2.5 mg total) by mouth daily as needed. Take if BP is above 170/100 14 tablet 0   traMADol (ULTRAM) 50 MG tablet Take 1 tablet (50 mg total) by mouth every 6 (six) hours as needed for severe pain. 20 tablet 1   No facility-administered medications prior to visit.    ROS: Review of Systems  Constitutional:  Negative for activity change, appetite change, chills, fatigue and unexpected weight change.  HENT:  Negative for congestion, mouth sores and sinus pressure.   Eyes:  Negative for visual disturbance.  Respiratory:  Negative for cough and chest tightness.   Gastrointestinal:  Negative for abdominal pain and nausea.  Genitourinary:  Negative for difficulty urinating, frequency and vaginal pain.  Musculoskeletal:  Positive for back pain. Negative for gait problem.  Skin:  Negative for pallor and rash.  Neurological:  Negative for dizziness, tremors, weakness, numbness and headaches.  Psychiatric/Behavioral:  Positive for decreased concentration and sleep disturbance. Negative for confusion. The patient is nervous/anxious.     Objective:  BP 130/80 (BP Location: Right Arm, Patient Position: Sitting, Cuff Size: Normal)   Pulse 76   Temp 98.3 F (36.8 C) (Oral)   Ht 5' (1.524 m)   Wt 115 lb (52.2 kg)   BMI 22.46 kg/m   BP Readings from Last 3 Encounters:  03/01/23 134/72  02/23/23 130/80  02/15/23 (!) 146/70    Wt Readings from Last 3 Encounters:  03/02/23 116 lb (52.6 kg)  03/01/23 116 lb (52.6 kg)  02/23/23 115 lb (52.2 kg)    Physical Exam Constitutional:      General: She is not in acute distress.    Appearance: Normal  appearance. She is well-developed.  HENT:     Head: Normocephalic.     Right Ear: External ear normal.     Left Ear: External ear normal.     Nose: Nose normal.  Eyes:     General:        Right eye: No discharge.        Left eye: No discharge.     Conjunctiva/sclera: Conjunctivae normal.     Pupils: Pupils are equal, round, and reactive to light.  Neck:     Thyroid: No thyromegaly.     Vascular: No JVD.     Trachea: No tracheal deviation.  Cardiovascular:     Rate and Rhythm: Normal rate and regular rhythm.     Heart sounds: Normal heart sounds.  Pulmonary:     Effort: No respiratory distress.     Breath sounds: No stridor. No wheezing.  Abdominal:     General: Bowel sounds are normal. There is no distension.     Palpations: Abdomen is soft. There is no mass.     Tenderness: There is no abdominal tenderness. There is no guarding or rebound.  Musculoskeletal:        General: No tenderness.     Cervical back: Normal range of motion and neck supple. No rigidity.  Lymphadenopathy:     Cervical: No cervical adenopathy.  Skin:    Findings: No erythema or rash.  Neurological:     Cranial Nerves: No cranial nerve deficit.     Motor: No abnormal muscle tone.     Coordination: Coordination normal.     Gait: Gait abnormal.     Deep Tendon Reflexes: Reflexes normal.  Psychiatric:        Behavior: Behavior normal.        Thought Content: Thought content normal.        Judgment: Judgment normal.    LS  -tender   Lab Results  Component Value Date   WBC 6.3 12/29/2022   HGB 12.7 12/29/2022   HCT 37.0 12/29/2022   PLT 324.0 12/29/2022   GLUCOSE 97 02/10/2023   CHOL 186 02/17/2022   TRIG 141.0 02/17/2022   HDL 59.30 02/17/2022   LDLDIRECT 98.0 08/13/2021   LDLCALC 99 02/17/2022   ALT 17 02/10/2023   AST 22 02/10/2023   NA 133 (L) 02/10/2023   K 4.0 02/10/2023   CL 96 02/10/2023   CREATININE 0.61 02/10/2023   BUN 15 02/10/2023   CO2 26 02/10/2023   TSH 1.71 02/10/2023    HGBA1C 5.5 06/13/2020    ECHOCARDIOGRAM COMPLETE  Result Date: 02/11/2023    ECHOCARDIOGRAM REPORT   Patient Name:   AMIYA TESSENDORF Date of Exam: 02/11/2023 Medical Rec #:  [id number]  Height:       60.5 in Accession #:    [id number]    Weight:       116.0 lb Date of Birth:  June 03, 1936      BSA:          1.490 m Patient Age:    43 years      BP:           133/76 mmHg Patient Gender: F             HR:           73 bpm. Exam Location:  Outpatient Procedure: 2D Echo, Color Doppler and Cardiac Doppler Indications:    I50.9* Heart failure (unspecified)  History:        Patient has prior history of Echocardiogram examinations, most                 recent 06/25/2022. Risk Factors:Dyslipidemia.  Sonographer:    Raquel Sarna Senior RDCS Referring Phys: [id number] DANIEL R BENSIMHON IMPRESSIONS  1. Left ventricular ejection fraction, by estimation, is 55 to 60%. The left ventricle has normal function. The left ventricle has no regional wall motion abnormalities. Left ventricular diastolic parameters are consistent with Grade I diastolic dysfunction (impaired relaxation).  2. Right ventricular systolic function is normal. The right ventricular size is normal. There is normal pulmonary artery systolic pressure.  3. The mitral valve is normal in structure. Mild mitral valve regurgitation. No evidence of mitral stenosis.  4. The aortic valve is tricuspid. There is mild calcification of the aortic valve. Aortic valve regurgitation is mild. Aortic valve sclerosis/calcification is present, without any evidence of aortic stenosis. Aortic regurgitation PHT measures 436 msec. Aortic valve area, by VTI measures 1.81 cm. Aortic valve mean gradient measures 5.0 mmHg. Aortic valve Vmax measures 1.45 m/s.  5. The inferior vena cava is normal in size with greater than 50% respiratory variability, suggesting right atrial pressure of 3 mmHg. FINDINGS  Left Ventricle: Left ventricular ejection fraction, by estimation, is 55 to 60%. The left ventricle  has normal function. The left ventricle has no regional wall motion abnormalities. The left ventricular internal cavity size was normal in size. There is  no left ventricular hypertrophy. Left ventricular diastolic parameters are consistent with Grade I diastolic dysfunction (impaired relaxation). Right Ventricle: The right ventricular size is normal. No increase in right ventricular wall thickness. Right ventricular systolic function is normal. There is normal pulmonary artery systolic pressure. The tricuspid regurgitant velocity is 2.35 m/s, and  with an assumed right atrial pressure of 3 mmHg, the estimated right ventricular systolic pressure is [id number] mmHg. Left Atrium: Left atrial size was normal in size. Right Atrium: Right atrial size was normal in size. Pericardium: There is no evidence of pericardial effusion. Mitral Valve: The mitral valve is normal in structure. Mild mitral valve regurgitation. No evidence of mitral valve stenosis. Tricuspid Valve: The tricuspid valve is normal in structure. Tricuspid valve regurgitation is not demonstrated. No evidence of tricuspid stenosis. Aortic Valve: The aortic valve is tricuspid. There is mild calcification of the aortic valve. Aortic valve regurgitation is mild. Aortic regurgitation PHT measures 436 msec. Aortic valve sclerosis/calcification is present, without any evidence of aortic stenosis. Aortic valve mean gradient measures 5.0 mmHg. Aortic valve peak gradient measures 8.4 mmHg. Aortic valve area, by VTI measures 1.81 cm. Pulmonic Valve: The pulmonic valve was normal in structure. Pulmonic valve regurgitation is trivial. No evidence of pulmonic stenosis. Aorta: The aortic root is normal in size  and structure. Venous: The inferior vena cava is normal in size with greater than 50% respiratory variability, suggesting right atrial pressure of 3 mmHg. IAS/Shunts: No atrial level shunt detected by color flow Doppler.  LEFT VENTRICLE PLAX 2D LVIDd:         4.00 cm    Diastology LVIDs:         2.90 cm   LV e' medial:    5.44 cm/s LV PW:         0.80 cm   LV E/e' medial:  8.4 LV IVS:        0.70 cm   LV e' lateral:   4.90 cm/s LVOT diam:     1.95 cm   LV E/e' lateral: 9.3 LV SV:         51 LV SV Index:   34 LVOT Area:     2.99 cm  RIGHT VENTRICLE RV S prime:     13.50 cm/s TAPSE (M-mode): 1.6 cm LEFT ATRIUM             Index        RIGHT ATRIUM           Index LA diam:        2.60 cm 1.74 cm/m   RA Area:     13.40 cm LA Vol (A2C):   43.7 ml 29.33 ml/m  RA Volume:   30.70 ml  20.60 ml/m LA Vol (A4C):   25.6 ml 17.18 ml/m LA Biplane Vol: 33.1 ml 22.21 ml/m  AORTIC VALVE AV Area (Vmax):    1.78 cm AV Area (Vmean):   1.81 cm AV Area (VTI):     1.81 cm AV Vmax:           145.00 cm/s AV Vmean:          101.000 cm/s AV VTI:            0.282 m AV Peak Grad:      8.4 mmHg AV Mean Grad:      5.0 mmHg LVOT Vmax:         86.60 cm/s LVOT Vmean:        61.200 cm/s LVOT VTI:          0.171 m LVOT/AV VTI ratio: 0.61 AI PHT:            436 msec  AORTA Ao Root diam: 2.90 cm Ao Asc diam:  2.90 cm MITRAL VALVE                  TRICUSPID VALVE MV Area (PHT): 3.48 cm       TR Peak grad:   22.1 mmHg MV Decel Time: 218 msec       TR Vmax:        235.00 cm/s MR Peak grad:    114.5 mmHg MR Mean grad:    64.0 mmHg    SHUNTS MR Vmax:         535.00 cm/s  Systemic VTI:  0.17 m MR Vmean:        360.0 cm/s   Systemic Diam: 1.95 cm MR PISA:         1.01 cm MR PISA Eff ROA: 7 mm MR PISA Radius:  0.40 cm MV E velocity: 45.50 cm/s MV A velocity: 85.10 cm/s MV E/A ratio:  0.53 Glori Bickers MD Electronically signed by Glori Bickers MD Signature Date/Time: 02/11/2023/2:06:19 PM    Final     Assessment & Plan:  Problem List Items Addressed This Visit       Cardiovascular and Mediastinum   Essential hypertension, benign - Primary    Elevated BP Reduce salt intake Take Norvasc 2.5 mg daily  SBP 140-150 at home      Relevant Medications   amLODipine (NORVASC) 2.5 MG tablet   Other  Relevant Orders   CBC with Differential/Platelet   Comprehensive metabolic panel   TSH   Urinalysis   Lipid panel     Endocrine   Hypothyroidism   Relevant Orders   Comprehensive metabolic panel   TSH   Lipid panel     Other   Memory impairment    Better - on Mamentine      Relevant Orders   CBC with Differential/Platelet   Urinary urgency    Chronic        Relevant Orders   CBC with Differential/Platelet   Urinalysis      Meds ordered this encounter  Medications   amLODipine (NORVASC) 2.5 MG tablet    Sig: Take 1 tablet (2.5 mg total) by mouth daily.    Dispense:  30 tablet    Refill:  11   traMADol (ULTRAM) 50 MG tablet    Sig: Take 1 tablet (50 mg total) by mouth every 6 (six) hours as needed for severe pain.    Dispense:  120 tablet    Refill:  2      Follow-up: Return in about 3 months (around 05/26/2023) for a follow-up visit.  Walker Kehr, MD

## 2023-02-23 NOTE — Telephone Encounter (Signed)
Patient Advocate Encounter  Received a fax from [employer] Part [employer] regarding Prior Authorization for Memantine HCl 5MG  tablets.  Key: [facility]  Authorization has been DENIED due to

## 2023-02-23 NOTE — Telephone Encounter (Signed)
Contacted [name] to schedule their annual wellness visit. Appointment made for 03/02/23.  *appt rescheduled per pt request to 03/02/23 from 03/01/23*  Norton Blizzard, [place] Deborra Medina)  [facility] [phone]

## 2023-02-23 NOTE — Assessment & Plan Note (Signed)
Better - on Mamentine

## 2023-02-23 NOTE — Progress Notes (Signed)
JOSHUA, AMBRIZ ([id number]) [id number] Page 1 of 5 Visit Report for 02/23/2023 Chief Complaint Document Details Patient Name: Date of Service: [name], [name] 02/23/2023 1:30 PM Medical Record Number: [id number] Patient Account Number: [id number] Date of Birth/Sex: Treating RN: 17-Jan-1936 (87 y.o. F) Primary Care Provider: Cassandria Anger Other Clinician: Referring Provider: Treating Provider/Extender: Guinevere Scarlet, Vivi Ferns in Treatment: 3 Information Obtained from: Patient Chief Complaint 02/03/2023; patient is here from the [facility] with an abrasion injury on her right lower leg Electronic Signature(s) Signed: 02/23/2023 1:37:48 PM By: Worthy Keeler PA-C Entered By: Worthy Keeler on 02/23/2023 13:37:48 -------------------------------------------------------------------------------- HPI Details Patient Name: Date of Service: [place]. 02/23/2023 1:30 PM Medical Record Number: [id number] Patient Account Number: [id number] Date of Birth/Sex: Treating RN: [birth year] (87 y.o. F) Primary Care Provider: Cassandria Anger Other Clinician: Referring Provider: Treating Provider/Extender: Charlestine Massed in Treatment: 3 History of Present Illness HPI Description: ADMISSION 02/02/23 Mrs. Jarboe is an 87 year old woman who lives in the independent [facility] the [facility]. She has been dealing with a leg wound since February 19 when she traumatized her right leg on a cleaning bucket. She has been using Bactroban and T and an Ace wrap. elfa Chronic low back pain, right hip pain, osteoporosis, hypertension, gait disease disorder she is not a diabetic. 02-16-2023 upon evaluation today patient appears to be doing excellent in regard to her wound although it does look a little bit dry overall I think we are still seeing some improvements here which is great news.  Fortunately I do not see any evidence of active infection locally nor systemically which is great news. No fevers, chills, nausea, vomiting, or diarrhea. 02-23-2023 upon evaluation today patient appears to be doing well currently in regard to her wounds. She actually is doing excellent and in fact the more medial portion of the wound is almost completely closed the lateral portion is much smaller I am very pleased with what we are seeing. Electronic Signature(s) Signed: 02/23/2023 1:56:36 PM By: Worthy Keeler PA-C Entered By: Worthy Keeler on 02/23/2023 13:56:36 -------------------------------------------------------------------------------- Physical Exam Details Patient Name: Date of Service: [name], [name] 02/23/2023 1:30 PM Medical Record Number: [id number] Patient Account Number: [id number] Date of Birth/Sex: Treating RN: 1936-03-08 (87 y.o. F) Primary Care Provider: Cassandria Anger Other Clinician: Referring Provider: Treating Provider/Extender: Guinevere Scarlet, Evie Lacks Weeks in Treatment: 3 Constitutional Well-nourished and well-hydrated in no acute distress. Respiratory TALAYAH, BABBIT ([id number]) [id number] Page 2 of 5 normal breathing without difficulty. Psychiatric this patient is able to make decisions and demonstrates good insight into disease process. Alert and Oriented x 3. pleasant and cooperative. Notes Upon inspection patient's wound bed actually showed signs of good granulation epithelization at this point. Fortunately I do not see any evidence of active infection locally nor systemically which is great news and overall I am extremely pleased with where we are currently. Electronic Signature(s) Signed: 02/23/2023 1:56:52 PM By: Worthy Keeler PA-C Entered By: Worthy Keeler on 02/23/2023 13:56:52 -------------------------------------------------------------------------------- Physician Orders Details Patient Name: Date  of Service: [name], [name] 02/23/2023 1:30 PM Medical Record Number: [id number] Patient Account Number: [id number] Date of Birth/Sex: Treating RN: [birth year] (87 y.o. [name] Primary Care Provider: Cassandria Anger Other Clinician: Referring Provider: Treating Provider/Extender: Guinevere Scarlet, Vivi Ferns in Treatment: 3 Verbal / Phone Orders: No Diagnosis Coding ICD-10  Coding Code Description L97.811 Non-pressure chronic ulcer of other part of right lower leg limited to breakdown of skin S80.811D Abrasion, right lower leg, subsequent encounter I87.311 Chronic venous hypertension (idiopathic) with ulcer of right lower extremity Follow-up Appointments ppointment in 1 week. Margarita Grizzle [facility] Room 9 03/02/2023 115pm Return A ppointment in 2 weeks. Margarita Grizzle Wednesday Room 8 4/10/224 130pm Return A Other: - [facility]/ Shower/ Hygiene May shower and wash wound with soap and water. Edema Control - Lymphedema / SCD / Other Bilateral Lower Extremities Avoid standing for long periods of time. Patient to wear own compression stockings every day. - pt to order from [facility]. Exercise regularly Wound Treatment Wound #1 - Lower Leg Wound Laterality: Right, Anterior Cleanser: Soap and Water Every Other Day/30 Days Discharge Instructions: May shower and wash wound with dial antibacterial soap and water prior to dressing change. Peri-Wound Care: Skin Prep (DME) (Generic) Every Other Day/30 Days Discharge Instructions: Use skin prep as directed Prim Dressing: Xeroform Occlusive Gauze Dressing, 4x4 in (DME) (Generic) Every Other Day/30 Days ary Discharge Instructions: Apply to wound bed as instructed Secondary Dressing: ALLEVYN Gentle Border, 5x5 (in/in) (DME) (Generic) Every Other Day/30 Days Discharge Instructions: Apply over primary dressing as directed. Electronic Signature(s) Signed: 02/23/2023 3:38:10 PM By: Worthy Keeler PA-C Signed: 02/23/2023  5:29:18 PM By: Deon Pilling RN, BSN Entered By: Deon Pilling on 02/23/2023 13:56:57 -------------------------------------------------------------------------------- Problem List Details Patient Name: Date of Service: [name], [name] 02/23/2023 1:30 PM [name] ([id number]) [id number] Page 3 of 5 Medical Record Number: [id number] Patient Account Number: [id number] Date of Birth/Sex: Treating RN: 01-03-1936 (87 y.o. F) Primary Care Provider: Cassandria Anger Other Clinician: Referring Provider: Treating Provider/Extender: Charlestine Massed in Treatment: 3 Active Problems ICD-10 Encounter Code Description Active Date MDM Diagnosis L97.811 Non-pressure chronic ulcer of other part of right lower leg limited to breakdown 02/02/2023 No Yes of skin S80.811D Abrasion, right lower leg, subsequent encounter 02/02/2023 No Yes I87.311 Chronic venous hypertension (idiopathic) with ulcer of right lower extremity 02/02/2023 No Yes Inactive Problems Resolved Problems Electronic Signature(s) Signed: 02/23/2023 1:37:43 PM By: Worthy Keeler PA-C Entered By: Worthy Keeler on 02/23/2023 13:37:42 -------------------------------------------------------------------------------- Progress Note Details Patient Name: Date of Service: [name], [name] 02/23/2023 1:30 PM Medical Record Number: [id number] Patient Account Number: [id number] Date of Birth/Sex: Treating RN: [birth year] (87 y.o. F) Primary Care Provider: Cassandria Anger Other Clinician: Referring Provider: Treating Provider/Extender: Guinevere Scarlet, Vivi Ferns in Treatment: 3 Subjective Chief Complaint Information obtained from Patient 02/03/2023; patient is here from the [facility] with an abrasion injury on her right lower leg History of Present Illness (HPI) ADMISSION 02/02/23 Mrs. Littrel is an 87 year old woman who lives in the independent  [facility] of the [facility]. She has been dealing with a leg wound since February 19 when she traumatized her right leg on a cleaning bucket. She has been using Bactroban and T and an Ace wrap. elfa Chronic low back pain, right hip pain, osteoporosis, hypertension, gait disease disorder she is not a diabetic. 02-16-2023 upon evaluation today patient appears to be doing excellent in regard to her wound although it does look a little bit dry overall I think we are still seeing some improvements here which is great news. Fortunately I do not see any evidence of active infection locally nor systemically which is great news. No fevers, chills, nausea, vomiting, or diarrhea. 02-23-2023 upon evaluation today  patient appears to be doing well currently in regard to her wounds. She actually is doing excellent and in fact the more medial portion of the wound is almost completely closed the lateral portion is much smaller I am very pleased with what we are seeing. [address] [name], [name] ([id number]) [id number] Page 4 of 5 Constitutional Well-nourished and well-hydrated in no acute distress. Vitals Time Taken: 1:39 PM, Temperature: 97.5 F, Pulse: 82 bpm, Respiratory Rate: 18 breaths/min, Blood Pressure: 149/78 mmHg. Respiratory normal breathing without difficulty. Psychiatric this patient is able to make decisions and demonstrates good insight into disease process. Alert and Oriented x 3. pleasant and cooperative. General Notes: Upon inspection patient's wound bed actually showed signs of good granulation epithelization at this point. Fortunately I do not see any evidence of active infection locally nor systemically which is great news and overall I am extremely pleased with where we are currently. Integumentary (Hair, Skin) Wound #1 status is Open. Original cause of wound was Skin T ear/Laceration. The date acquired was: 01/10/2023. The wound has been in  treatment 3 weeks. The wound is located on the Right,Anterior Lower Leg. The wound measures 5.4cm length x 5.2cm width x 0.1cm depth; 22.054cm^2 area and 2.205cm^3 volume. There is Fat Layer (Subcutaneous Tissue) exposed. There is no tunneling or undermining noted. There is a medium amount of serosanguineous drainage noted. The wound margin is distinct with the outline attached to the wound base. There is large (67-100%) red granulation within the wound bed. There is a small (1-33%) amount of necrotic tissue within the wound bed including Adherent Slough. The periwound skin appearance exhibited: Scarring. The periwound skin appearance did not exhibit: Callus, Crepitus, Excoriation, Induration, Rash, Dry/Scaly, Maceration, Atrophie Blanche, Cyanosis, Ecchymosis, Hemosiderin Staining, Mottled, Pallor, Rubor, Erythema. Periwound temperature was noted as No Abnormality. The periwound has tenderness on palpation. Assessment Active Problems ICD-10 Non-pressure chronic ulcer of other part of right lower leg limited to breakdown of skin Abrasion, right lower leg, subsequent encounter Chronic venous hypertension (idiopathic) with ulcer of right lower extremity Plan Follow-up Appointments: Return Appointment in 1 week. Margarita Grizzle [facility] Room 9 03/02/2023 115pm Return Appointment in 2 weeks. Margarita Grizzle Wednesday Room 8 4/10/224 130pm Other: - [facility]/ [facility]ower/ Hygiene: May shower and wash wound with soap and water. Edema Control - Lymphedema / SCD / Other: Avoid standing for long periods of time. Patient to wear own compression stockings every day. - pt to order from [facility]. Exercise regularly WOUND #1: - Lower Leg Wound Laterality: Right, Anterior Cleanser: Soap and Water Every Other Day/30 Days Discharge Instructions: May shower and wash wound with dial antibacterial soap and water prior to dressing change. Peri-Wound Care: Skin Prep (DME) (Generic) Every Other Day/30 Days Discharge  Instructions: Use skin prep as directed Prim Dressing: Xeroform Occlusive Gauze Dressing, 4x4 in (DME) (Generic) Every Other Day/30 Days ary Discharge Instructions: Apply to wound bed as instructed Secondary Dressing: ALLEVYN Gentle Border, 5x5 (in/in) (DME) (Generic) Every Other Day/30 Days Discharge Instructions: Apply over primary dressing as directed. 1. I am going to suggest that we have the patient continue to monitor for any signs of infection or worsening. Based on what I am seeing I do believe that we are headed in the right direction here. 2. I am also going to suggest the patient should continue to monitor for any signs of the wound getting worse obviously if this occurs she should let me know. 3. I am also can recommend we continue with the  Xeroform followed by the bordered foam dressing to cover which has been doing extremely well for her. We will see patient back for reevaluation in 1 week here in the clinic. If anything worsens or changes patient will contact our office for additional recommendations. Electronic Signature(s) Signed: 02/23/2023 1:57:31 PM By: Worthy Keeler PA-C Entered By: Worthy Keeler on 02/23/2023 13:57:31 SuperBill Details -------------------------------------------------------------------------------- [name] ([id number]) [id number] Page 5 of 5 Patient Name: Date of Service: [name], [name] 02/23/2023 Medical Record Number: [id number] Patient Account Number: [id number] Date of Birth/Sex: Treating RN: [birth year] (87 y.o. Helene Shoe, Meta.Reding Primary Care Provider: Cassandria Anger Other Clinician: Referring Provider: Treating Provider/Extender: Charlestine Massed in Treatment: 3 Diagnosis Coding ICD-10 Codes Code Description [phone] Non-pressure chronic ulcer of other part of right lower leg limited to breakdown of skin S80.811D Abrasion, right lower leg, subsequent encounter I87.311 Chronic  venous hypertension (idiopathic) with ulcer of right lower extremity Facility Procedures CPT4 Code Description Modifier Quantity [id number] - WOUND CARE VISIT-LEV 3 EST PT 1 Physician Procedures Quantity CPT4 Code Description Modifier [id number] - WC PHYS LEVEL 3 - EST PT 1 ICD-10 Diagnosis Description L97.811 Non-pressure chronic ulcer of other part of right lower leg limited to breakdown of skin S80.811D Abrasion, right lower leg, subsequent encounter I87.311 Chronic venous hypertension (idiopathic) with ulcer of right lower extremity Electronic Signature(s) Signed: 02/23/2023 1:57:45 PM By: Worthy Keeler PA-C Entered By: Worthy Keeler on 02/23/2023 13:57:45

## 2023-02-23 NOTE — Assessment & Plan Note (Signed)
Chronic. 

## 2023-02-23 NOTE — Telephone Encounter (Signed)
Contacted [name] to schedule their annual wellness visit. Appointment made for 03/01/23.  Norton Blizzard, [place] ([facility])  [facility] [phone]

## 2023-02-24 DIAGNOSIS — M542 Cervicalgia: Secondary | ICD-10-CM | POA: Diagnosis not present

## 2023-02-24 NOTE — Telephone Encounter (Signed)
Pt was seen by PCP on 02/23/2023 for this matter.

## 2023-02-24 NOTE — Progress Notes (Signed)
[name], [name] ([id number]) [id number] Page 1 of 7 Visit Report for 02/23/2023 Arrival Information Details Patient Name: Date of Service: [name], [name] 02/23/2023 1:30 PM Medical Record Number: [id number] Patient Account Number: [id number] Date of Birth/Sex: Treating RN: 02-27-1936 (87 y.o. F) Primary Care Katalaya Beel: Cassandria Anger Other Clinician: Referring Sigmond Patalano: Treating Tareka Jhaveri/Extender: Charlestine Massed in Treatment: 3 Visit Information History Since Last Visit Added or deleted any medications: No Patient Arrived: [name] Any new allergies or adverse reactions: No Arrival Time: 13:38 Had a fall or experienced change in No Accompanied By: self activities of daily living that may affect Transfer Assistance: None risk of falls: Patient Identification Verified: Yes Signs or symptoms of abuse/neglect since last visito No Secondary Verification Process Completed: Yes Hospitalized since last visit: No Patient Requires Transmission-Based Precautions: No Implantable device outside of the clinic excluding No Patient Has Alerts: No cellular tissue based products placed in the center since last visit: Has Dressing in Place as Prescribed: Yes Pain Present Now: No Electronic Signature(s) Signed: 02/24/2023 8:57:18 AM By: Sandre Kitty Entered By: Sandre Kitty on 02/23/2023 13:39:05 -------------------------------------------------------------------------------- Clinic Level of Care Assessment Details Patient Name: Date of Service: [name] HINAKO, DEVINCENT 02/23/2023 1:30 PM Medical Record Number: [id number] Patient Account Number: [id number] Date of Birth/Sex: Treating RN: [birth year] (87 y.o. Helene Shoe, Meta.Reding Primary Care Florella Mcneese: Cassandria Anger Other Clinician: Referring Lorell Thibodaux: Treating Brandyn Lowrey/Extender: Charlestine Massed in Treatment: 3 Clinic Level of Care Assessment Items TOOL 4 Quantity  Score X- 1 0 Use when only an EandM is performed on FOLLOW-UP visit ASSESSMENTS - Nursing Assessment / Reassessment X- 1 10 Reassessment of [name]-morbidities (includes updates in patient status) X- 1 5 Reassessment of Adherence to Treatment Plan ASSESSMENTS - Wound and Skin A ssessment / Reassessment X - Simple Wound Assessment / Reassessment - one wound 1 5 []  - 0 Complex Wound Assessment / Reassessment - multiple wounds X- 1 10 Dermatologic / Skin Assessment (not related to wound area) ASSESSMENTS - Focused Assessment X- 1 5 Circumferential Edema Measurements - multi extremities []  - 0 Nutritional Assessment / Counseling / Intervention []  - 0 Lower Extremity Assessment (monofilament, tuning fork, pulses) []  - 0 Peripheral Arterial Disease Assessment (using hand held doppler) ASSESSMENTS - Ostomy and/or Continence Assessment and Care []  - 0 Incontinence Assessment and Management []  - 0 Ostomy Care Assessment and Management (repouching, etc.) PROCESS - Coordination of Care X - Simple Patient / Family Education for ongoing care 1 15 [place], [place] ([id number]) [id number] Page 2 of 7 []  - 0 Complex (extensive) Patient / Family Education for ongoing care X- 1 10 Staff obtains Programmer, systems, Records, T Results / Process Orders est []  - 0 Staff telephones HHA, Nursing Homes / Clarify orders / etc []  - 0 Routine Transfer to another Facility (non-emergent condition) []  - 0 Routine Hospital Admission (non-emergent condition) []  - 0 New Admissions / Biomedical engineer / Ordering NPWT Apligraf, etc. , []  - 0 Emergency Hospital Admission (emergent condition) X- 1 10 Simple Discharge Coordination []  - 0 Complex (extensive) Discharge Coordination PROCESS - Special Needs []  - 0 Pediatric / Minor Patient Management []  - 0 Isolation Patient Management []  - 0 Hearing / Language / Visual special needs []  - 0 Assessment of Community assistance  (transportation, D/C planning, etc.) []  - 0 Additional assistance / Altered mentation []  - 0 Support Surface(s) Assessment (bed, cushion, seat, etc.) INTERVENTIONS - Wound Cleansing / Measurement X - Simple  Wound Cleansing - one wound 1 5 []  - 0 Complex Wound Cleansing - multiple wounds X- 1 5 Wound Imaging (photographs - any number of wounds) []  - 0 Wound Tracing (instead of photographs) X- 1 5 Simple Wound Measurement - one wound []  - 0 Complex Wound Measurement - multiple wounds INTERVENTIONS - Wound Dressings X - Small Wound Dressing one or multiple wounds 1 10 []  - 0 Medium Wound Dressing one or multiple wounds []  - 0 Large Wound Dressing one or multiple wounds []  - 0 Application of Medications - topical []  - 0 Application of Medications - injection INTERVENTIONS - Miscellaneous []  - 0 External ear exam []  - 0 Specimen Collection (cultures, biopsies, blood, body fluids, etc.) []  - 0 Specimen(s) / Culture(s) sent or taken to Lab for analysis []  - 0 Patient Transfer (multiple staff / Civil Service fast streamer / Similar devices) []  - 0 Simple Staple / Suture removal (25 or less) []  - 0 Complex Staple / Suture removal (26 or more) []  - 0 Hypo / Hyperglycemic Management (close monitor of Blood Glucose) []  - 0 Ankle / Brachial Index (ABI) - do not check if billed separately X- 1 5 Vital Signs Has the patient been seen at the hospital within the last three years: Yes Total Score: 100 Level Of Care: New/Established - Level 3 Electronic Signature(s) Signed: 02/23/2023 5:29:18 PM By: Deon Pilling RN, BSN Entered By: Deon Pilling on 02/23/2023 13:55:22 [name] ([id number] Page 3 of 7 -------------------------------------------------------------------------------- Encounter Discharge Information Details Patient Name: Date of Service: [name], [name] 02/23/2023 1:30 PM Medical Record Number: [id number] Patient Account Number:  [id number] Date of Birth/Sex: Treating RN: 1936-06-05 (87 y.o. [name] Primary Care Jenascia Bumpass: Cassandria Anger Other Clinician: Referring Huxley Shurley: Treating Nathanyl Andujo/Extender: Guinevere Scarlet, Vivi Ferns in Treatment: 3 Encounter Discharge Information Items Discharge Condition: Stable Ambulatory Status: Ambulatory Discharge Destination: Home Transportation: Private Auto Accompanied By: self Schedule Follow-up Appointment: Yes Clinical Summary of Care: Electronic Signature(s) Signed: 02/23/2023 5:29:18 PM By: Deon Pilling RN, BSN Entered By: Deon Pilling on 02/23/2023 13:55:45 -------------------------------------------------------------------------------- Lower Extremity Assessment Details Patient Name: Date of Service: [name], [name]. 02/23/2023 1:30 PM Medical Record Number: [id number] Patient Account Number: [id number] Date of Birth/Sex: Treating RN: [birth year] (87 y.o. [name] Primary Care Nida Manfredi: Cassandria Anger Other Clinician: Referring Haislee Corso: Treating Fairy Ashlock/Extender: Guinevere Scarlet, Evie Lacks Weeks in Treatment: 3 Edema Assessment Assessed: [Left: No] [Right: No] [Left: Edema] [Right: :] Calf Left: Right: Point of Measurement: 28 cm From Medial Instep 29 cm Ankle Left: Right: Point of Measurement: 10 cm From Medial Instep 19 cm Electronic Signature(s) Signed: 02/23/2023 5:29:18 PM By: Deon Pilling RN, BSN Entered By: Deon Pilling on 02/23/2023 13:51:32 -------------------------------------------------------------------------------- [place] Details Patient Name: Date of Service: [name], [name]. 02/23/2023 1:30 PM Medical Record Number: [id number] Patient Account Number: [id number] Date of Birth/Sex: Treating RN: 11/14/1936 (87 y.o. [name] Primary Care Miamarie Moll: Cassandria Anger Other Clinician: Referring Johnnie Goynes: Treating Hiep Ollis/Extender: Guinevere Scarlet,  Vivi Ferns in Treatment: [address] [name], [name] ([id number]) [id number] Page 4 of 7 Nursing Diagnoses: Impaired tissue integrity Knowledge deficit related to ulceration/compromised skin integrity Goals: Patient/caregiver will verbalize understanding of skin care regimen Date Initiated: 02/02/2023 Target Resolution Date: 03/02/2023 Goal Status: Active Ulcer/skin breakdown will have a volume reduction of 30% by week 4 Date Initiated: 02/02/2023 Target Resolution Date: 03/02/2023 Goal Status: Active Interventions: Assess patient/caregiver  ability to obtain necessary supplies Assess patient/caregiver ability to perform ulcer/skin care regimen upon admission and as needed Assess ulceration(s) every visit Provide education on ulcer and skin care Notes: Electronic Signature(s) Signed: 02/23/2023 5:29:18 PM By: Deon Pilling RN, BSN Entered By: Deon Pilling on 02/23/2023 13:52:33 -------------------------------------------------------------------------------- Pain Assessment Details Patient Name: Date of Service: [name], [name]. 02/23/2023 1:30 PM Medical Record Number: [id number] Patient Account Number: [id number] Date of Birth/Sex: Treating RN: 1936-10-28 (87 y.o. F) Primary Care Luvenia Cranford: Cassandria Anger Other Clinician: Referring Koda Defrank: Treating Kynli Chou/Extender: Charlestine Massed in Treatment: 3 Active Problems Location of Pain Severity and Description of Pain Patient Has Paino Yes Site Locations Pain Management and Medication Current Pain Management: Electronic Signature(s) Signed: 02/24/2023 8:57:18 AM By: Sandre Kitty Entered By: Sandre Kitty on 02/23/2023 13:39:33 -------------------------------------------------------------------------------- Patient/Caregiver Education Details Patient Name: Date of Service: [name], [name] 3/27/2024andnbsp1:30 PM [name]  ([id number] Page 5 of 7 Medical Record Number: [id number] Patient Account Number: [id number] Date of Birth/Gender: Treating RN: 11/30/1935 (87 y.o. [name] Primary Care Physician: Cassandria Anger Other Clinician: Referring Physician: Treating Physician/Extender: Guinevere Scarlet, Vivi Ferns in Treatment: 3 Education Assessment Education Provided To: Patient Education Topics Provided Wound/Skin Impairment: Handouts: Caring for Your Ulcer Methods: Explain/Verbal Responses: Reinforcements needed Electronic Signature(s) Signed: 02/23/2023 5:29:18 PM By: Deon Pilling RN, BSN Entered By: Deon Pilling on 02/23/2023 13:52:50 -------------------------------------------------------------------------------- Wound Assessment Details Patient Name: Date of Service: [name], [name]. 02/23/2023 1:30 PM Medical Record Number: [id number] Patient Account Number: [id number] Date of Birth/Sex: Treating RN: 1935-12-07 (87 y.o. F) Primary Care Alexandria Shiflett: Cassandria Anger Other Clinician: Referring Seanne Chirico: Treating Merrilee Ancona/Extender: Guinevere Scarlet, Vivi Ferns in Treatment: 3 Wound Status Wound Number: 1 Primary Etiology: Abrasion Wound Location: Right, Anterior Lower Leg Wound Status: Open Wounding Event: Skin Tear/Laceration Comorbid Cataracts, Asthma, Congestive Heart Failure, History: Osteoarthritis Date Acquired: 01/10/2023 Weeks Of Treatment: 3 Clustered Wound: Yes Photos Wound Measurements Length: (cm) Width: (cm) Depth: (cm) Clustered Quantity: Area: (cm) Volume: (cm) 5.4 % Reduction in Area: 25.7% 5.2 % Reduction in Volume: 25.7% 0.1 Epithelialization: Small (1-33%) 2 Tunneling: No 22.054 Undermining: No 2.205 Wound Description Classification: Full Thickness Without Exposed Support Structures Wound Margin: Distinct, outline attached Exudate Amount: Medium Exudate Type:  Serosanguineous Exudate Color: red, brown [name], [name] ([id number]) Wound Bed Granulation Amount: Large (67-100%) Granulation Quality: Red Necrotic Amount: Small (1-33%) Necrotic Quality: Adherent Slough Foul Odor After Cleansing: No Slough/Fibrino Yes [id number] Page 6 of 7 Exposed Structure Fascia Exposed: No Fat Layer (Subcutaneous Tissue) Exposed: Yes Tendon Exposed: No Muscle Exposed: No Joint Exposed: No Bone Exposed: No Periwound Skin Texture Texture Color No Abnormalities Noted: No No Abnormalities Noted: No Callus: No Atrophie Blanche: No Crepitus: No Cyanosis: No Excoriation: No Ecchymosis: No Induration: No Erythema: No Rash: No Hemosiderin Staining: No Scarring: Yes Mottled: No Pallor: No Moisture Rubor: No No Abnormalities Noted: No Dry / Scaly: No Temperature / Pain Maceration: No Temperature: No Abnormality Tenderness on Palpation: Yes Treatment Notes Wound #1 (Lower Leg) Wound Laterality: Right, Anterior Cleanser Soap and Water Discharge Instruction: May shower and wash wound with dial antibacterial soap and water prior to dressing change. Peri-Wound Care Skin Prep Discharge Instruction: Use skin prep as directed Topical Primary Dressing Xeroform Occlusive Gauze Dressing, 4x4 in Discharge Instruction: Apply to wound bed as instructed Secondary Dressing ALLEVYN Gentle Border, 5x5 (in/in) Discharge Instruction: Apply over primary dressing as directed. Secured With  Compression Wrap Compression Stockings Add-Ons Electronic Signature(s) Signed: 02/23/2023 5:29:18 PM By: Deon Pilling RN, BSN Entered By: Deon Pilling on 02/23/2023 13:51:42 -------------------------------------------------------------------------------- Vitals Details Patient Name: Date of Service: [place]. 02/23/2023 1:30 PM Medical Record Number: [id number] Patient Account Number: [id number] Date of Birth/Sex: Treating RN: Apr 10, 1936 (87  y.o. F) Primary Care Yomar Mejorado: Cassandria Anger Other Clinician: Referring Phyllis Abelson: Treating Tenzin Pavon/Extender: Guinevere Scarlet, Vivi Ferns in Treatment: 3 Vital Signs Time Taken: 13:39 Temperature (F): 97.5 Pulse (bpm): 82 Respiratory Rate (breaths/min): 18 SHAREETA, SJOBLOM [name] ([id number] Page 7 of 7 Blood Pressure (mmHg): 149/78 Reference Range: 80 - 120 mg / dl Electronic Signature(s) Signed: 02/24/2023 8:57:18 AM By: Sandre Kitty Entered By: Sandre Kitty on 02/23/2023 13:39:27

## 2023-02-28 ENCOUNTER — Ambulatory Visit: Payer: Medicare Other | Admitting: Rheumatology

## 2023-03-01 ENCOUNTER — Ambulatory Visit (INDEPENDENT_AMBULATORY_CARE_PROVIDER_SITE_OTHER): Payer: Medicare Other | Admitting: Sports Medicine

## 2023-03-01 ENCOUNTER — Ambulatory Visit (HOSPITAL_BASED_OUTPATIENT_CLINIC_OR_DEPARTMENT_OTHER): Payer: Medicare Other | Admitting: Physical Therapy

## 2023-03-01 ENCOUNTER — Ambulatory Visit: Payer: Medicare Other | Admitting: Sports Medicine

## 2023-03-01 VITALS — BP 134/72 | Ht 60.0 in | Wt 116.0 lb

## 2023-03-01 DIAGNOSIS — M5136 Other intervertebral disc degeneration, lumbar region: Secondary | ICD-10-CM

## 2023-03-01 NOTE — Assessment & Plan Note (Signed)
Because she has had no significant relief with the treatments we have tried thus far we decided to give her a trial with ESWT today.  2 of her friends at [facility] got significant relief and so she would like to try it. I did recommend that she try the swimming pool therapy at [place] and she plans to do this once her contusion heals.  ESWT Lower level 70 Ultrasound impulses 2000 Frequency 12 Probe head large  Patient tolerated the initial treatment session and felt some immediate relief.  She is good to monitor this over the next 48 hours and if it seems effective we can start doing 1 treatment per week.  Otherwise she will move toward getting involved in the physical therapy.

## 2023-03-01 NOTE — Progress Notes (Signed)
Chief complaint low back pain  Patient is struggling with low back pain.  It centralizes more around the right SI joint although not specifically to the joint. She got dramatic relief from an IM steroid injection on her last visit but it only lasted 3 days.  Prior to that time when we had injected her SI joint she got dramatic relief but only for 24 hours.  Considering her lack of response to steroid therapy given her any prolonged relief we are looking for other options.  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  She did [facility] a [facility]pping [facility] her walker and bruising her right rib cage.  Physical exam Is an older female in no acute distress BP 134/72   Ht 5' (1.524 m)   Wt 116 lb (52.6 kg)   BMI 22.65 kg/m   Patient has a significant contusion over her right lower rib cage.  The ribs themselves are not exquisitely tender but the contusion is. Her breathing is unlabored. Localizes her back pain to the right SI joint area and on palpation there is tenderness noted in the soft tissue. She is able to walk without a limp. Her motion and other testing is unchanged from her past visit.

## 2023-03-02 ENCOUNTER — Encounter (HOSPITAL_BASED_OUTPATIENT_CLINIC_OR_DEPARTMENT_OTHER): Payer: Medicare Other | Attending: Physician Assistant | Admitting: Physician Assistant

## 2023-03-02 ENCOUNTER — Ambulatory Visit (INDEPENDENT_AMBULATORY_CARE_PROVIDER_SITE_OTHER): Payer: Medicare Other

## 2023-03-02 VITALS — Ht 60.0 in | Wt 116.0 lb

## 2023-03-02 DIAGNOSIS — G8929 Other chronic pain: Secondary | ICD-10-CM | POA: Diagnosis not present

## 2023-03-02 DIAGNOSIS — X58XXXA Exposure to other specified factors, initial encounter: Secondary | ICD-10-CM | POA: Diagnosis not present

## 2023-03-02 DIAGNOSIS — I10 Essential (primary) hypertension: Secondary | ICD-10-CM | POA: Diagnosis not present

## 2023-03-02 DIAGNOSIS — S81811A Laceration without foreign body, right lower leg, initial encounter: Secondary | ICD-10-CM | POA: Diagnosis not present

## 2023-03-02 DIAGNOSIS — Z0000 Encounter for general adult medical examination without abnormal findings: Secondary | ICD-10-CM | POA: Diagnosis not present

## 2023-03-02 DIAGNOSIS — M810 Age-related osteoporosis without current pathological fracture: Secondary | ICD-10-CM | POA: Diagnosis not present

## 2023-03-02 DIAGNOSIS — L97811 Non-pressure chronic ulcer of other part of right lower leg limited to breakdown of skin: Secondary | ICD-10-CM | POA: Insufficient documentation

## 2023-03-02 DIAGNOSIS — I87311 Chronic venous hypertension (idiopathic) with ulcer of right lower extremity: Secondary | ICD-10-CM | POA: Diagnosis not present

## 2023-03-02 DIAGNOSIS — S80811A Abrasion, right lower leg, initial encounter: Secondary | ICD-10-CM | POA: Diagnosis not present

## 2023-03-02 NOTE — Progress Notes (Addendum)
[name], [name] ([id number]) [id number] Page 1 of 5 Visit Report for 03/02/2023 Chief Complaint Document Details Patient Name: Date of Service: [name], [name] 03/02/2023 1:15 PM Medical Record Number: [id number] Patient Account Number: [id number] Date of Birth/Sex: Treating RN: [birth year] (87 y.o. F) Primary Care Provider: Cassandria Anger Other Clinician: Referring Provider: Treating Provider/Extender: Guinevere Scarlet, Vivi Ferns in Treatment: 4 Information Obtained from: Patient Chief Complaint 02/03/2023; patient is here from the [facility] with an abrasion injury on her right lower leg Electronic Signature(s) Signed: 03/02/2023 1:16:02 PM By: Worthy Keeler PA-C Entered By: Worthy Keeler on 03/02/2023 13:16:02 -------------------------------------------------------------------------------- HPI Details Patient Name: Date of Service: [name], [name] 03/02/2023 1:15 PM Medical Record Number: [id number] Patient Account Number: [id number] Date of Birth/Sex: Treating RN: 1936-03-09 (87 y.o. F) Primary Care Provider: Cassandria Anger Other Clinician: Referring Provider: Treating Provider/Extender: Charlestine Massed in Treatment: 4 History of Present Illness HPI Description: ADMISSION 02/02/23 Mrs. Cordell is an 87 year old woman who lives in the independent [facility] of the [facility]. She has been dealing with a leg wound since February 19 when she traumatized her right leg on a cleaning bucket. She has been using Bactroban and T and an Ace wrap. elfa Chronic low back pain, right hip pain, osteoporosis, hypertension, gait disease disorder she is not a diabetic. 02-16-2023 upon evaluation today patient appears to be doing excellent in regard to her wound although it does look a little bit dry overall I think we are still seeing some improvements here which is great news. Fortunately  I do not see any evidence of active infection locally nor systemically which is great news. No fevers, chills, nausea, vomiting, or diarrhea. 02-23-2023 upon evaluation today patient appears to be doing well currently in regard to her wounds. She actually is doing excellent and in fact the more medial portion of the wound is almost completely closed the lateral portion is much smaller I am very pleased with what we are seeing. 03-02-2023 upon evaluation today patient appears to be doing well currently in regard to her wound was actually showing signs of excellent improvement. Fortunately I do not see any evidence of active infection locally nor systemically which is great news and overall I am extremely pleased with where things stand. There is no sign of infection. No fevers, chills, nausea, vomiting, or diarrhea. Electronic Signature(s) Signed: 03/02/2023 1:57:53 PM By: Worthy Keeler PA-C Entered By: Worthy Keeler on 03/02/2023 13:57:52 -------------------------------------------------------------------------------- Physical Exam Details Patient Name: Date of Service: [name], [name] 03/02/2023 1:15 PM Medical Record Number: [id number] Patient Account Number: [id number] Date of Birth/Sex: Treating RN: 09/08/1936 (86 y.o. F) Primary Care Provider: Cassandria Anger Other Clinician: Referring Provider: Treating Provider/Extender: Guinevere Scarlet, Vivi Ferns in Treatment: [address] [name] ([id number]) [id number] Page 2 of 5 Well-nourished and well-hydrated in no acute distress. Respiratory normal breathing without difficulty. Psychiatric this patient is able to make decisions and demonstrates good insight into disease process. Alert and Oriented x 3. pleasant and cooperative. Notes Upon inspection patient's wound bed actually showed signs of good granulation epithelization at this point. In fact I think the Xeroform gauze is really  doing an excellent job here for her and recommend that we continue as such with that currently. Electronic Signature(s) Signed: 03/02/2023 1:58:19 PM By: Worthy Keeler PA-C Entered By: Worthy Keeler on 03/02/2023 13:58:19 -------------------------------------------------------------------------------- Physician Orders Details  Patient Name: Date of Service: [name], [name] 03/02/2023 1:15 PM Medical Record Number: [id number] Patient Account Number: [id number] Date of Birth/Sex: Treating RN: [birth year] (87 y.o. Helene Shoe, [name].Reding Primary Care Provider: Cassandria Anger Other Clinician: Referring Provider: Treating Provider/Extender: Charlestine Massed in Treatment: 4 Verbal / Phone Orders: No Diagnosis Coding ICD-10 Coding Code Description L97.811 Non-pressure chronic ulcer of other part of right lower leg limited to breakdown of skin S80.811D Abrasion, right lower leg, subsequent encounter I87.311 Chronic venous hypertension (idiopathic) with ulcer of right lower extremity Follow-up Appointments ppointment in 1 week. Margarita Grizzle Wednesday Room 9 4/10/224 1015am Return A ppointment in 2 weeks. Margarita Grizzle Wednesday Room 8 03/16/2023 1230pm Return A Other: - [facility]/ Shower/ Hygiene May shower and wash wound with soap and water. Edema Control - Lymphedema / SCD / Other Bilateral Lower Extremities Avoid standing for long periods of time. Patient to wear own compression stockings every day. - pt to order from [facility]. Exercise regularly Wound Treatment Wound #1 - Lower Leg Wound Laterality: Right, Anterior Cleanser: Soap and Water Every Other Day/30 Days Discharge Instructions: May shower and wash wound with dial antibacterial soap and water prior to dressing change. Peri-Wound Care: Skin Prep (Generic) Every Other Day/30 Days Discharge Instructions: Use skin prep as directed Prim Dressing: Xeroform Occlusive Gauze Dressing, 4x4 in (Generic)  Every Other Day/30 Days ary Discharge Instructions: Apply to wound bed as instructed Secondary Dressing: ALLEVYN Gentle Border, 5x5 (in/in) (Generic) Every Other Day/30 Days Discharge Instructions: Apply over primary dressing as directed. Electronic Signature(s) Signed: 03/02/2023 4:14:50 PM By: Worthy Keeler PA-C Signed: 03/02/2023 4:55:19 PM By: Deon Pilling RN, BSN Entered By: Deon Pilling on 03/02/2023 13:35:27 [name] ([id number] Page 3 of 5 -------------------------------------------------------------------------------- Problem List Details Patient Name: Date of Service: XAVIA, HERGET 03/02/2023 1:15 PM Medical Record Number: [id number] Patient Account Number: [id number] Date of Birth/Sex: Treating RN: [birth year] (87 y.o. F) Primary Care Provider: Cassandria Anger Other Clinician: Referring Provider: Treating Provider/Extender: Charlestine Massed in Treatment: 4 Active Problems ICD-10 Encounter Code Description Active Date MDM Diagnosis L97.811 Non-pressure chronic ulcer of other part of right lower leg limited to breakdown 02/02/2023 No Yes of skin S80.811D Abrasion, right lower leg, subsequent encounter 02/02/2023 No Yes I87.311 Chronic venous hypertension (idiopathic) with ulcer of right lower extremity 02/02/2023 No Yes Inactive Problems Resolved Problems Electronic Signature(s) Signed: 03/02/2023 1:15:51 PM By: Worthy Keeler PA-C Entered By: Worthy Keeler on 03/02/2023 13:15:51 -------------------------------------------------------------------------------- Progress Note Details Patient Name: Date of Service: [name], Charlottie H. 03/02/2023 1:15 PM Medical Record Number: [id number] Patient Account Number: [id number] Date of Birth/Sex: Treating RN: 03/06/1936 (87 y.o. F) Primary Care Provider: Cassandria Anger Other Clinician: Referring Provider: Treating Provider/Extender: Guinevere Scarlet, Vivi Ferns in Treatment: 4 Subjective Chief Complaint Information obtained from Patient 02/03/2023; patient is here from the [facility] with an abrasion injury on her right lower leg History of Present Illness (HPI) ADMISSION 02/02/23 Mrs. Merlo is an 87 year old woman who lives in the independent part of the [facility]. She has been dealing with a leg wound since February 19 when she traumatized her right leg on a cleaning bucket. She has been using Bactroban and T and an Ace wrap. elfa Chronic low back pain, right hip pain, osteoporosis, hypertension, gait disease disorder she is not a diabetic. 02-16-2023 upon evaluation today patient appears to  be doing excellent in regard to her wound although it does look a little bit dry overall I think we are still seeing some improvements here which is great news. Fortunately I do not see any evidence of active infection locally nor systemically which is great news. No fevers, chills, nausea, vomiting, or diarrhea. 02-23-2023 upon evaluation today patient appears to be doing well currently in regard to her wounds. She actually is doing excellent and in fact the more medial portion of the wound is almost completely closed the lateral portion is much smaller I am very pleased with what we are seeing. 03-02-2023 upon evaluation today patient appears to be doing well currently in regard to her wound was actually showing signs of excellent improvement. Fortunately I do not see any evidence of active infection locally nor systemically which is great news and overall I am extremely pleased with where things stand. There is no sign of infection. No fevers, chills, nausea, vomiting, or diarrhea. ELVER, VANFLEET ([id number]) [id number] Page 4 of 5 Objective Constitutional Well-nourished and well-hydrated in no acute distress. Vitals Time Taken: 1:16 PM, Temperature: 98 F,  Pulse: 81 bpm, Respiratory Rate: 16 breaths/min, Blood Pressure: 144/82 mmHg. Respiratory normal breathing without difficulty. Psychiatric this patient is able to make decisions and demonstrates good insight into disease process. Alert and Oriented x 3. pleasant and cooperative. General Notes: Upon inspection patient's wound bed actually showed signs of good granulation epithelization at this point. In fact I think the Xeroform gauze is really doing an excellent job here for her and recommend that we continue as such with that currently. Integumentary (Hair, Skin) Wound #1 status is Open. Original cause of wound was Skin T ear/Laceration. The date acquired was: 01/10/2023. The wound has been in treatment 4 weeks. The wound is located on the Right,Anterior Lower Leg. The wound measures 4.5cm length x 1.1cm width x 0.1cm depth; 3.888cm^2 area and 0.389cm^3 volume. There is Fat Layer (Subcutaneous Tissue) exposed. There is no tunneling or undermining noted. There is a medium amount of serosanguineous drainage noted. The wound margin is distinct with the outline attached to the wound base. There is large (67-100%) red granulation within the wound bed. There is no necrotic tissue within the wound bed. The periwound skin appearance exhibited: Scarring. The periwound skin appearance did not exhibit: Callus, Crepitus, Excoriation, Induration, Rash, Dry/Scaly, Maceration, Atrophie Blanche, Cyanosis, Ecchymosis, Hemosiderin Staining, Mottled, Pallor, Rubor, Erythema. Periwound temperature was noted as No Abnormality. The periwound has tenderness on palpation. Assessment Active Problems ICD-10 Non-pressure chronic ulcer of other part of right lower leg limited to breakdown of skin Abrasion, right lower leg, subsequent encounter Chronic venous hypertension (idiopathic) with ulcer of right lower extremity Plan Follow-up Appointments: Return Appointment in 1 week. Margarita Grizzle Wednesday Room 9 4/10/224  1015am Return Appointment in 2 weeks. Margarita Grizzle Wednesday Room 8 03/16/2023 1230pm Other: - [facility]/ [facility]ower/ Hygiene: May shower and wash wound with soap and water. Edema Control - Lymphedema / SCD / Other: Avoid standing for long periods of time. Patient to wear own compression stockings every day. - pt to order from [facility]. Exercise regularly WOUND #1: - Lower Leg Wound Laterality: Right, Anterior Cleanser: Soap and Water Every Other Day/30 Days Discharge Instructions: May shower and wash wound with dial antibacterial soap and water prior to dressing change. Peri-Wound Care: Skin Prep (Generic) Every Other Day/30 Days Discharge Instructions: Use skin prep as directed Prim Dressing: Xeroform Occlusive Gauze Dressing, 4x4 in (Generic) Every Other Day/30  Days ary Discharge Instructions: Apply to wound bed as instructed Secondary Dressing: ALLEVYN Gentle Border, 5x5 (in/in) (Generic) Every Other Day/30 Days Discharge Instructions: Apply over primary dressing as directed. 1. I am recommend that we have the patient continue to monitor for any signs of infection or worsening in general. Based on what I am seeing currently I do believe that she is actually making some pretty good progress here and I am very pleased in that regard I am hopeful she will continue to show improvement as we progress over the next several weeks. 2. I am also can recommend that we should continue specifically with the Xeroform gauze followed by the Allevyn dressing. 3. I am also going to suggest that she should continue to utilize her on compression stockings which I think are also going to do a good job in that regard. We will see patient back for reevaluation in 1 week here in the clinic. If anything worsens or changes patient will contact our office for additional recommendations. Electronic Signature(s) Signed: 03/02/2023 1:59:07 PM By: Isaiah Blakes, [place] ([id number])  [id number] Page 5 of 5 Entered By: Worthy Keeler on 03/02/2023 13:59:07 -------------------------------------------------------------------------------- SuperBill Details Patient Name: Date of Service: [name] AAMIYA, MULLIKIN 03/02/2023 Medical Record Number: [id number] Patient Account Number: [id number] Date of Birth/Sex: Treating RN: 15-Jun-1936 (87 y.o. [name] Primary Care Provider: Cassandria Anger Other Clinician: Referring Provider: Treating Provider/Extender: Charlestine Massed in Treatment: 4 Diagnosis Coding ICD-10 Codes Code Description [phone] Non-pressure chronic ulcer of other part of right lower leg limited to breakdown of skin S80.811D Abrasion, right lower leg, subsequent encounter I87.311 Chronic venous hypertension (idiopathic) with ulcer of right lower extremity Facility Procedures : CPT4 Code: [id number] Description: 99213 - WOUND CARE VISIT-LEV 3 EST PT Modifier: Quantity: 1 Physician Procedures : CPT4 Code Description Modifier [id number] - WC PHYS LEVEL 3 - EST PT ICD-10 Diagnosis Description L97.811 Non-pressure chronic ulcer of other part of right lower leg limited to breakdown of skin S80.811D Abrasion, right lower leg, subsequent  encounter I87.311 Chronic venous hypertension (idiopathic) with ulcer of right lower extremity Quantity: 1 Electronic Signature(s) Signed: 03/02/2023 1:59:18 PM By: Worthy Keeler PA-C Entered By: Worthy Keeler on 03/02/2023 13:59:17

## 2023-03-02 NOTE — Progress Notes (Signed)
MAIGAN, BREMNER ([id number]) [id number] Page 1 of 7 Visit Report for 03/02/2023 Arrival Information Details Patient Name: Date of Service: [name], [name] 03/02/2023 1:15 PM Medical Record Number: [id number] Patient Account Number: [id number] Date of Birth/Sex: Treating RN: [birth year] (87 y.o. F) Primary Care Leontyne Manville: Cassandria Anger Other Clinician: Referring Abass Misener: Treating Kemper Hochman/Extender: Charlestine Massed in Treatment: 4 Visit Information History Since Last Visit Added or deleted any medications: No Patient Arrived: Walker Any new allergies or adverse reactions: No Arrival Time: 13:16 Had a fall or experienced change in No Accompanied By: self activities of daily living that may affect Transfer Assistance: None risk of falls: Patient Identification Verified: Yes Signs or symptoms of abuse/neglect since last visito No Secondary Verification Process Completed: Yes Hospitalized since last visit: No Patient Requires Transmission-Based Precautions: No Implantable device outside of the clinic excluding No Patient Has Alerts: No cellular tissue based products placed in the center since last visit: Has Dressing in Place as Prescribed: Yes Pain Present Now: No Electronic Signature(s) Signed: 03/02/2023 4:50:23 PM By: Erenest Blank Entered By: Erenest Blank on 03/02/2023 13:16:47 -------------------------------------------------------------------------------- Clinic Level of Care Assessment Details Patient Name: Date of Service: [name], [name] 03/02/2023 1:15 PM Medical Record Number: [id number] Patient Account Number: [id number] Date of Birth/Sex: Treating RN: 09/12/1936 (87 y.o. Helene Shoe, Meta.Reding Primary Care Keiondre Colee: Cassandria Anger Other Clinician: Referring Lynze Reddy: Treating Hamdi Kley/Extender: Charlestine Massed in Treatment: 4 Clinic Level of Care Assessment Items TOOL 4 Quantity  Score X- 1 0 Use when only an EandM is performed on FOLLOW-UP visit ASSESSMENTS - Nursing Assessment / Reassessment X- 1 10 Reassessment of [name]-morbidities (includes updates in patient status) X- 1 5 Reassessment of Adherence to Treatment Plan ASSESSMENTS - Wound and Skin A ssessment / Reassessment X - Simple Wound Assessment / Reassessment - one wound 1 5 []  - 0 Complex Wound Assessment / Reassessment - multiple wounds X- 1 10 Dermatologic / Skin Assessment (not related to wound area) ASSESSMENTS - Focused Assessment X- 1 5 Circumferential Edema Measurements - multi extremities []  - 0 Nutritional Assessment / Counseling / Intervention []  - 0 Lower Extremity Assessment (monofilament, tuning fork, pulses) []  - 0 Peripheral Arterial Disease Assessment (using hand held doppler) ASSESSMENTS - Ostomy and/or Continence Assessment and Care []  - 0 Incontinence Assessment and Management []  - 0 Ostomy Care Assessment and Management (repouching, etc.) PROCESS - Coordination of Care X - Simple Patient / Family Education for ongoing care 1 15 [place], [place] ([id number]) [id number] Page 2 of 7 []  - 0 Complex (extensive) Patient / Family Education for ongoing care X- 1 10 Staff obtains Programmer, systems, Records, T Results / Process Orders est []  - 0 Staff telephones HHA, Nursing Homes / Clarify orders / etc []  - 0 Routine Transfer to another Facility (non-emergent condition) []  - 0 Routine Hospital Admission (non-emergent condition) []  - 0 New Admissions / Biomedical engineer / Ordering NPWT Apligraf, etc. , []  - 0 Emergency Hospital Admission (emergent condition) X- 1 10 Simple Discharge Coordination []  - 0 Complex (extensive) Discharge Coordination PROCESS - Special Needs []  - 0 Pediatric / Minor Patient Management []  - 0 Isolation Patient Management []  - 0 Hearing / Language / Visual special needs []  - 0 Assessment of Community assistance  (transportation, D/C planning, etc.) []  - 0 Additional assistance / Altered mentation []  - 0 Support Surface(s) Assessment (bed, cushion, seat, etc.) INTERVENTIONS - Wound Cleansing / Measurement X - Simple  Wound Cleansing - one wound 1 5 []  - 0 Complex Wound Cleansing - multiple wounds X- 1 5 Wound Imaging (photographs - any number of wounds) []  - 0 Wound Tracing (instead of photographs) X- 1 5 Simple Wound Measurement - one wound []  - 0 Complex Wound Measurement - multiple wounds INTERVENTIONS - Wound Dressings X - Small Wound Dressing one or multiple wounds 1 10 []  - 0 Medium Wound Dressing one or multiple wounds []  - 0 Large Wound Dressing one or multiple wounds []  - 0 Application of Medications - topical []  - 0 Application of Medications - injection INTERVENTIONS - Miscellaneous []  - 0 External ear exam []  - 0 Specimen Collection (cultures, biopsies, blood, body fluids, etc.) []  - 0 Specimen(s) / Culture(s) sent or taken to Lab for analysis []  - 0 Patient Transfer (multiple staff / Civil Service fast streamer / Similar devices) []  - 0 Simple Staple / Suture removal (25 or less) []  - 0 Complex Staple / Suture removal (26 or more) []  - 0 Hypo / Hyperglycemic Management (close monitor of Blood Glucose) []  - 0 Ankle / Brachial Index (ABI) - do not check if billed separately X- 1 5 Vital Signs Has the patient been seen at the hospital within the last three years: Yes Total Score: 100 Level Of Care: New/Established - Level 3 Electronic Signature(s) Signed: 03/02/2023 4:55:19 PM By: Deon Pilling RN, BSN Entered By: Deon Pilling on 03/02/2023 13:36:05 [name] ([id number] Page 3 of 7 -------------------------------------------------------------------------------- Encounter Discharge Information Details Patient Name: Date of Service: [name], [name] 03/02/2023 1:15 PM Medical Record Number: [id number] Patient Account Number:  [id number] Date of Birth/Sex: Treating RN: 13-Feb-1936 (87 y.o. [name] Primary Care Beaumont Austad: Cassandria Anger Other Clinician: Referring Shivangi Lutz: Treating Raziah Funnell/Extender: Guinevere Scarlet, Vivi Ferns in Treatment: 4 Encounter Discharge Information Items Discharge Condition: Stable Ambulatory Status: Walker Discharge Destination: Home Transportation: Private Auto Accompanied By: self Schedule Follow-up Appointment: Yes Clinical Summary of Care: Electronic Signature(s) Signed: 03/02/2023 4:55:19 PM By: Deon Pilling RN, BSN Entered By: Deon Pilling on 03/02/2023 13:36:27 -------------------------------------------------------------------------------- Lower Extremity Assessment Details Patient Name: Date of Service: [name], [name] 03/02/2023 1:15 PM Medical Record Number: [id number] Patient Account Number: [id number] Date of Birth/Sex: Treating RN: 04-27-1936 (87 y.o. F) Primary Care Marilena Trevathan: Cassandria Anger Other Clinician: Referring Emil Weigold: Treating Traxton Kolenda/Extender: Guinevere Scarlet, Evie Lacks Weeks in Treatment: 4 Edema Assessment Assessed: [Left: No] [Right: No] [Left: Edema] [Right: :] Calf Left: Right: Point of Measurement: 28 cm From Medial Instep 33.5 cm Ankle Left: Right: Point of Measurement: 10 cm From Medial Instep 20.2 cm Electronic Signature(s) Signed: 03/02/2023 4:50:23 PM By: Erenest Blank Entered By: Erenest Blank on 03/02/2023 13:17:25 -------------------------------------------------------------------------------- Multi-Disciplinary Care Plan Details Patient Name: Date of Service: [name], [name] 03/02/2023 1:15 PM Medical Record Number: [id number] Patient Account Number: [id number] Date of Birth/Sex: Treating RN: 13-Mar-1936 (87 y.o. [name] Primary Care Cavin Longman: Cassandria Anger Other Clinician: Referring Harmani Neto: Treating Ariyona Eid/Extender: Guinevere Scarlet, Vivi Ferns in  Treatment: [address] LISLIE, GOHLKE ([id number]) [id number] Page 4 of 7 Nursing Diagnoses: Impaired tissue integrity Knowledge deficit related to ulceration/compromised skin integrity Goals: Patient/caregiver will verbalize understanding of skin care regimen Date Initiated: 02/02/2023 Target Resolution Date: 04/01/2023 Goal Status: Active Ulcer/skin breakdown will have a volume reduction of 30% by week 4 Date Initiated: 02/02/2023 Date Inactivated: 03/02/2023 Target Resolution Date: 03/02/2023 Goal Status: Met Interventions: Assess patient/caregiver ability  to obtain necessary supplies Assess patient/caregiver ability to perform ulcer/skin care regimen upon admission and as needed Assess ulceration(s) every visit Provide education on ulcer and skin care Notes: Electronic Signature(s) Signed: 03/02/2023 4:55:19 PM By: Deon Pilling RN, BSN Entered By: Deon Pilling on 03/02/2023 13:32:50 -------------------------------------------------------------------------------- Pain Assessment Details Patient Name: Date of Service: [name], [name] 03/02/2023 1:15 PM Medical Record Number: [id number] Patient Account Number: [id number] Date of Birth/Sex: Treating RN: March 28, 1936 (87 y.o. F) Primary Care Kumari Sculley: Cassandria Anger Other Clinician: Referring Laisa Larrick: Treating Alcide Memoli/Extender: Charlestine Massed in Treatment: 4 Active Problems Location of Pain Severity and Description of Pain Patient Has Paino No Site Locations Character of Pain Describe the Pain: Burning Pain Management and Medication Current Pain Management: Electronic Signature(s) Signed: 03/02/2023 4:50:23 PM By: Erenest Blank Entered By: Erenest Blank on 03/02/2023 13:17:15 [name] ([id number] Page 5 of  7 -------------------------------------------------------------------------------- Patient/Caregiver Education Details Patient Name: Date of Service: [name], [name] 4/3/2024andnbsp1:15 PM Medical Record Number: [id number] Patient Account Number: [id number] Date of Birth/Gender: Treating RN: [birth year] (87 y.o. [name] Primary Care Physician: Cassandria Anger Other Clinician: Referring Physician: Treating Physician/Extender: Guinevere Scarlet, Vivi Ferns in Treatment: 4 Education Assessment Education Provided To: Patient Education Topics Provided Wound/Skin Impairment: Handouts: Caring for Your Ulcer Methods: Explain/Verbal Responses: Reinforcements needed Electronic Signature(s) Signed: 03/02/2023 4:55:19 PM By: Deon Pilling RN, BSN Entered By: Deon Pilling on 03/02/2023 13:33:02 -------------------------------------------------------------------------------- Wound Assessment Details Patient Name: Date of Service: [name], [name] 03/02/2023 1:15 PM Medical Record Number: [id number] Patient Account Number: [id number] Date of Birth/Sex: Treating RN: July 28, 1936 (87 y.o. F) Primary Care Jakarri Lesko: Cassandria Anger Other Clinician: Referring Nneoma Harral: Treating Saje Gallop/Extender: Guinevere Scarlet, Vivi Ferns in Treatment: 4 Wound Status Wound Number: 1 Primary Etiology: Abrasion Wound Location: Right, Anterior Lower Leg Wound Status: Open Wounding Event: Skin Tear/Laceration Comorbid Cataracts, Asthma, Congestive Heart Failure, History: Osteoarthritis Date Acquired: 01/10/2023 Weeks Of Treatment: 4 Clustered Wound: Yes Photos Wound Measurements Length: (cm) Width: (cm) Depth: (cm) Clustered Quantity: Area: (cm) Volume: (cm) 4.5 % Reduction in Area: 86.9% 1.1 % Reduction in Volume: 86.9% 0.1 Epithelialization: Small (1-33%) 1 Tunneling: No 3.888 Undermining: No 0.389 Wound Description Classification: Full Thickness Without  Exposed Support S Wound Margin: Distinct, outline attached Exudate Amount: Medium [name], [name] ([id number]) Exudate Type: Serosanguineous Exudate Color: red, brown tructures Foul Odor After Cleansing: No Slough/Fibrino Yes [id number] Page 6 of 7 Wound Bed Granulation Amount: Large (67-100%) Exposed Structure Granulation Quality: Red Fascia Exposed: No Necrotic Amount: None Present (0%) Fat Layer (Subcutaneous Tissue) Exposed: Yes Tendon Exposed: No Muscle Exposed: No Joint Exposed: No Bone Exposed: No Periwound Skin Texture Texture Color No Abnormalities Noted: No No Abnormalities Noted: No Callus: No Atrophie Blanche: No Crepitus: No Cyanosis: No Excoriation: No Ecchymosis: No Induration: No Erythema: No Rash: No Hemosiderin Staining: No Scarring: Yes Mottled: No Pallor: No Moisture Rubor: No No Abnormalities Noted: No Dry / Scaly: No Temperature / Pain Maceration: No Temperature: No Abnormality Tenderness on Palpation: Yes Treatment Notes Wound #1 (Lower Leg) Wound Laterality: Right, Anterior Cleanser Soap and Water Discharge Instruction: May shower and wash wound with dial antibacterial soap and water prior to dressing change. Peri-Wound Care Skin Prep Discharge Instruction: Use skin prep as directed Topical Primary Dressing Xeroform Occlusive Gauze Dressing, 4x4 in Discharge Instruction: Apply to wound bed as instructed Secondary Dressing ALLEVYN Gentle Border, 5x5 (in/in) Discharge Instruction: Apply over primary dressing  as directed. Secured With Compression Wrap Compression Stockings Environmental education officer) Signed: 03/02/2023 4:50:23 PM By: Erenest Blank Entered By: Erenest Blank on 03/02/2023 13:18:44 -------------------------------------------------------------------------------- Vitals Details Patient Name: Date of Service: [name], [name] 03/02/2023 1:15 PM Medical Record Number: [id number] Patient  Account Number: [id number] Date of Birth/Sex: Treating RN: August 08, 1936 (87 y.o. F) Primary Care Kenzlee Fishburn: Cassandria Anger Other Clinician: Referring Dajsha Massaro: Treating Kiyana Vazguez/Extender: Guinevere Scarlet, Vivi Ferns in Treatment: [address] [name], [name] ([id number]) [id number] Page 7 of 7 Time Taken: 13:16 Temperature (F): 98 Pulse (bpm): 81 Respiratory Rate (breaths/min): 16 Blood Pressure (mmHg): 144/82 Reference Range: 80 - 120 mg / dl Electronic Signature(s) Signed: 03/02/2023 4:50:23 PM By: Erenest Blank Entered By: Erenest Blank on 03/02/2023 13:17:05

## 2023-03-02 NOTE — Telephone Encounter (Signed)
Memantine was prescribed by Sharene Butters, PA -[facility] Thanks

## 2023-03-02 NOTE — Progress Notes (Addendum)
I connected with  [name] on 03/02/23 by a audio enabled telemedicine application and verified that I am speaking with the correct person using two identifiers.  Patient Location: Home  Provider Location: Office/Clinic  I discussed the limitations of evaluation and management by telemedicine. The patient expressed understanding and agreed to proceed.  Subjective:   [name] is a 87 y.o. female who presents for Medicare Annual (Subsequent) preventive examination.  Review of Systems     Cardiac Risk Factors include: advanced age (>105men, >47 women);dyslipidemia;hypertension;family history of premature cardiovascular disease     Objective:    Today's Vitals   03/02/23 0903  Weight: 116 lb (52.6 kg)  Height: 5' (1.524 m)  PainSc: 6   PainLoc: Back   Body mass index is 22.65 kg/m.     03/02/2023    9:06 AM 02/09/2023    9:02 AM 12/08/2022    3:44 PM 11/08/2022    7:47 AM 07/12/2022   11:13 AM 07/08/2022    9:17 PM 10/26/2021   11:18 AM  Advanced Directives  Does Patient Have a Medical Advance Directive? Yes Yes No Yes Yes Yes Yes  Type of Paramedic of [place];Living will Healthcare Power of [place];Living will [place];Living will [place];Living will  Does patient want to make changes to medical advance directive? No - Patient declined No - Patient declined   No - Patient declined    Copy of [place] in Chart? Yes - validated most recent copy scanned in chart (See row information) No - copy requested   Yes - validated most recent copy scanned in chart (See row information) No - copy requested   Would patient like information on creating a medical advance directive?   No - Patient declined        Current Medications (verified) Outpatient Encounter Medications as of 03/02/2023  Medication Sig   acetaminophen (TYLENOL) 500 MG tablet Take 1,000 mg by mouth  every 6 (six) hours as needed for mild pain.   albuterol (VENTOLIN HFA) 108 (90 Base) MCG/ACT inhaler Take 2 puffs every 4-6 hours as needed   amLODipine (NORVASC) 2.5 MG tablet Take 1 tablet (2.5 mg total) by mouth daily.   Azelastine HCl 0.15 % SOLN Place into both nostrils. Place into both nostrils.   Calcium Carbonate-Vitamin D (CALCIUM CARBONATE W/VITAMIN D PO) Take 1 tablet by mouth daily.   desonide (DESOWEN) 0.05 % cream Apply 1 application topically as needed.   diclofenac Sodium (VOLTAREN) 1 % GEL APPLY 4 GRAMS 4 TIMES DAILY.   EPINEPHrine (EPIPEN 2-PAK) 0.3 mg/0.3 mL IJ SOAJ injection Inject 0.3 mLs (0.3 mg total) into the muscle as needed.   famotidine (PEPCID) 40 MG tablet Take 1 tablet (40 mg total) by mouth at bedtime. NEEDS OFFICE VISIT FOR ADDITIONAL REFILLS   Gauze Pads & Dressings (TELFA ADHESIVE DRESSING) 3"X4" PADS Use qd   ipratropium (ATROVENT) 0.06 % nasal spray Place 1 spray into the nose 3 (three) times daily as needed for rhinitis.   levocetirizine (XYZAL) 5 MG tablet Take 5 mg by mouth every evening.   levothyroxine (SYNTHROID) 50 MCG tablet TAKE ONE TABLET BY MOUTH DAILY BEFORE BREAKFAST   LORazepam (ATIVAN) 1 MG tablet Take 2-3 tablets at bedtime and 1-2 tablets in the daytime as needed.   memantine (NAMENDA) 5 MG tablet Take 5 mg by mouth 2 (two) times daily.   montelukast (SINGULAIR)  10 MG tablet Take 1 tablet (10 mg total) by mouth at bedtime.   polyethylene glycol powder (GLYCOLAX/MIRALAX) 17 GM/SCOOP powder Take 17 g by mouth 2 (two) times daily as needed for moderate constipation.   simvastatin (ZOCOR) 40 MG tablet TAKE ONE TABLET ONCE DAILY   traMADol (ULTRAM) 50 MG tablet Take 1 tablet (50 mg total) by mouth every 6 (six) hours as needed for severe pain.   triamcinolone (NASACORT) 55 MCG/ACT AERO nasal inhaler Place 2 sprays into the nose as needed.   VESICARE 10 MG tablet Take 1 tablet (10 mg total) by mouth daily as needed (bladder).   No  facility-administered encounter medications on file as of 03/02/2023.    Allergies (verified) Bee venom, Bacitracin-polymyxin b, Clarithromycin, Doxycycline, Neosporin [neomycin-bacitracin zn-polymyx], and Hydrocortisone   History: Past Medical History:  Diagnosis Date   Allergic rhinitis    Asthma    Atrophic vaginitis    Baker's cyst    Left-Dr. Aluisio   Cataract    Dr. Katy Fitch   CHF (congestive heart failure)    Colon polyps    Fibroid    Gastritis    chronic   GERD (gastroesophageal reflux disease)    Heart murmur    Hemorrhoids    Hyperlipidemia    Hypothyroidism    IBS (irritable bowel syndrome)    Lumbar spondylosis    MVP (mitral valve prolapse)    Antibiotics required for dental procedures   OA (osteoarthritis)    Osteoporosis 06/2018   T score -2.2 stable on Prolia   Overactive bladder    Sacroiliitis    Thyroid disease    Thyroid nodule    Torn meniscus    bilateral   Past Surgical History:  Procedure Laterality Date   CATARACT EXTRACTION, BILATERAL     EYE SURGERY     POSTERIOR CERVICAL FUSION/FORAMINOTOMY N/A 05/15/2021   Procedure: Cervical One Laminectomy with resection of cyst, Fixation from [place] to Cervical Four;  Surgeon: Erline Levine, MD;  Location: [facility];  Service: Neurosurgery;  Laterality: N/A;   TONSILLECTOMY     Family History  Problem Relation Age of Onset   Heart disease Mother    Hypertension Mother    Osteoporosis Mother    Congestive Heart Failure Mother    Pancreatic cancer Father    Heart attack Brother    Drug abuse Brother        over dose    Healthy Son    Healthy Son    Colon cancer Neg Hx    Colon polyps Neg Hx    Rectal cancer Neg Hx    Stomach cancer Neg Hx    Social History   Socioeconomic History   Marital status: Married    Spouse name: Not on file   Number of children: 2   Years of education: 15   Highest education level: Not on file  Occupational History   Occupation: retired    Fish farm manager: RETIRED   Tobacco Use   Smoking status: Former    Years: 10    Types: Cigarettes    Quit date: 1973    Years since quitting: 51.2    Passive exposure: Never   Smokeless tobacco: Never   Tobacco comments:    Former smoker 06/25/22  Vaping Use   Vaping Use: Never used  Substance and Sexual Activity   Alcohol use: Yes    Comment: Socially   Drug use: No   Sexual activity: Yes    Birth control/protection:  Post-menopausal, None    Comment: 1st intercourse 87 yo-Fewer than 5 partners  Other Topics Concern   Not on file  Social History Narrative   Regular exercise-yesDaily caffeine use   Right handed   Lives alone   retired   Investment banker, operational of Health   Financial Resource Strain: [place]  (03/02/2023)   Overall Financial Resource Strain (CARDIA)    Difficulty of Paying Living Expenses: Not hard at all  Food Insecurity: No Food Insecurity (03/02/2023)   Hunger Vital Sign    Worried About Running Out of Food in the Last Year: Never true    Ran Out of Food in the Last Year: Never true  Transportation Needs: No Transportation Needs (03/02/2023)   PRAPARE - Hydrologist (Medical): No    Lack of Transportation (Non-Medical): No  Physical Activity: Sufficiently Active (03/02/2023)   Exercise Vital Sign    Days of Exercise per Week: 5 days    Minutes of Exercise per Session: 40 min  Stress: No Stress Concern Present (03/02/2023)   [place]    Feeling of Stress : Not at all  Social Connections: Moderately Integrated (03/02/2023)   Social Connection and Isolation Panel [NHANES]    Frequency of Communication with Friends and Family: Twice a week    Frequency of Social Gatherings with Friends and Family: Twice a week    Attends Religious Services: More than 4 times per year    Active Member of [employer] or Organizations: Yes    Attends Archivist Meetings: More than 4 times per year    Marital  Status: Widowed    Tobacco Counseling Counseling given: Not Answered Tobacco comments: Former smoker 06/25/22   Clinical Intake:  Pre-visit preparation completed: Yes  Pain : No/denies pain Pain Score: 6      BMI - recorded: 22.65 Nutritional Status: BMI of 19-24  Normal Nutritional Risks: None Diabetes: No  How often do you need to have someone help you when you read instructions, pamphlets, or other written materials from your doctor or pharmacy?: 1 - Never What is the last grade level you completed in school?: HSG  Diabetic? No  Interpreter Needed?: No  Information entered by :: Lisette Abu, LPN.   Activities of Daily Living    03/02/2023    9:14 AM  In your present state of health, do you have any difficulty performing the following activities:  Hearing? 0  Vision? 0  Difficulty concentrating or making decisions? 0  Walking or climbing stairs? 0  Dressing or bathing? 0  Doing errands, shopping? 0  Preparing Food and eating ? N  Using the Toilet? N  In the past six months, have you accidently leaked urine? N  Do you have problems with loss of bowel control? N  Managing your Medications? N  Managing your Finances? N  Housekeeping or managing your Housekeeping? N    Patient Care Team: Plotnikov, Evie Lacks, MD as PCP - General Bensimhon, Shaune Pascal, MD as PCP - Advanced Heart Failure (Cardiology) Bennetta Laos, MD (Inactive) (Obstetrics and Gynecology) Joanann Shipper, MD (Gastroenterology) Sharyne Peach, MD as Consulting Physician (Ophthalmology) Bo Merino, MD as Consulting Physician (Rheumatology) Erline Levine, MD as Consulting Physician (Neurosurgery) Dawley, Theodoro Doing, DO as Consulting Physician  Indicate any recent Medical Services you may have received from other than [facility] providers in the past year (date may be approximate).     Assessment:  This is a routine wellness examination for Francelia.  Hearing/Vision screen Hearing  Screening - Comments:: Denies hearing difficulties   Vision Screening - Comments:: Wears rx glasses - up to date with routine eye exams with Sharyne Peach, MD.   Dietary issues and exercise activities discussed: Current Exercise Habits: Home exercise routine, Type of exercise: walking, Time (Minutes): 40, Frequency (Times/Week): 5, Weekly Exercise (Minutes/Week): 200, Intensity: Moderate, Exercise limited by: respiratory conditions(s);orthopedic condition(s)   Goals Addressed             This Visit's Progress    My healthcare goal for 2024 is to maintain my current health status by continuing to eat healthy, stay independent, physically and socially active.        Depression Screen    03/02/2023    9:09 AM 01/17/2023    3:33 PM 12/15/2022    2:41 PM 11/16/2022    3:09 PM 07/05/2022   11:32 AM 10/05/2021    3:32 PM 08/19/2021   10:49 AM  PHQ 2/9 Scores  PHQ - 2 Score 0 0 0 0 1 0 0  PHQ- 9 Score    0 1  3    Fall Risk    03/02/2023    9:08 AM 02/23/2023   10:39 AM 02/09/2023    9:06 AM 01/17/2023    3:32 PM 12/15/2022    2:40 PM  Fall Risk   Falls in the past year? 1 0 0 1 1  Number falls in past yr: 0 0 0 0 0  Injury with Fall? 0 0 0 1 0  Comment     brusied knee  Risk for fall due to : Impaired balance/gait No Fall Risks  Other (Comment) No Fall Risks  Follow up Falls prevention discussed Falls evaluation completed Falls evaluation completed Falls evaluation completed Falls evaluation completed    [place]:  Any stairs in or around the home? Yes  elevator If so, are there any without handrails? No  Home free of loose throw rugs in walkways, pet beds, electrical cords, etc? Yes  Adequate lighting in your home to reduce risk of falls? Yes   ASSISTIVE DEVICES UTILIZED TO PREVENT FALLS:  Life alert? Yes  Use of a cane, walker or w/c? Yes  Grab bars in the bathroom? Yes  Shower chair or bench in shower? Yes  Elevated toilet seat or a  handicapped toilet? Yes   TIMED UP AND GO:  Was the test performed? No . Telephonic Visit  Cognitive Function:    02/10/2017    1:39 PM  MMSE - Mini Mental State Exam  Orientation to time 5  Orientation to Place 5  Registration 3  Attention/ Calculation 5  Recall 2  Language- name 2 objects 2  Language- repeat 1  Language- follow 3 step command 3  Language- read & follow direction 1  Write a sentence 1  Copy design 1  Total score 29      11/08/2022    7:00 PM  Montreal Cognitive Assessment   Visuospatial/ Executive (0/5) 1  Naming (0/3) 1  Attention: Read list of digits (0/2) 2  Attention: Read list of letters (0/1) 1  Attention: Serial 7 subtraction starting at 100 (0/3) 3  Language: Repeat phrase (0/2) 2  Language : Fluency (0/1) 1  Abstraction (0/2) 1  Delayed Recall (0/5) 3  Orientation (0/6) 6  Total 21  Adjusted Score (based on education) 22  03/02/2023    9:09 AM 06/18/2020   10:21 AM  6CIT Screen  What Year? 0 points 0 points  What month? 0 points 0 points  What time? 0 points 0 points  Count back from 20 0 points 0 points  Months in reverse 0 points 0 points  Repeat phrase 0 points 0 points  Total Score 0 points 0 points    Immunizations Immunization History  Administered Date(s) Administered   COVID-19, mRNA, vaccine(Comirnaty)12 years and older 09/29/2022   Fluad Quad(high Dose 65+) 08/04/2019, 09/16/2021, 09/12/2022   Influenza Whole 09/29/2007, 08/21/2008, 09/02/2010, 07/30/2012   Influenza, High Dose Seasonal PF 09/03/2013, 02/19/2015, 11/12/2015, 08/06/2016, 11/11/2016, 08/20/2017, 08/31/2017, 09/20/2018, 09/12/2019   Influenza,inj,Quad PF,6+ Mos 12/17/2014, 07/30/2015   PFIZER(Purple Top)SARS-COV-2 Vaccination 12/12/2019, 12/31/2019, 07/29/2020, 07/29/2020, 07/31/2020   Pfizer Covid-19 Vaccine Bivalent Booster 15yrs & up 08/21/2021   Pneumococcal Conjugate-13 01/09/2014   Pneumococcal Polysaccharide-23 09/26/2006, 08/12/2015,  11/11/2016, 08/31/2017, 09/10/2020, 09/10/2021   Td 04/14/2010   Tdap 06/18/2020   Typhoid Inactivated 02/23/2013   Zoster, Live 12/09/2006    TDAP status: Up to date  Flu Vaccine status: Up to date  Pneumococcal vaccine status: Up to date  Covid-19 vaccine status: Completed vaccines  Qualifies for Shingles Vaccine? Yes   Zostavax completed Yes   Shingrix Completed?: No.    Education has been provided regarding the importance of this vaccine. Patient has been advised to call insurance company to determine out of pocket expense if they have not yet received this vaccine. Advised may also receive vaccine at local pharmacy or Health Dept. Verbalized acceptance and understanding.  Screening Tests Health Maintenance  Topic Date Due   COVID-19 Vaccine (9 - 2023-24 season) 07/29/2023 (Originally 11/24/2022)   INFLUENZA VACCINE  06/30/2023   PAP SMEAR-Modifier  08/08/2023   Medicare Annual Wellness (AWV)  03/01/2024   DTaP/Tdap/Td (3 - Td or Tdap) 06/18/2030   Pneumonia Vaccine 55+ Years old  Completed   DEXA SCAN  Completed   HPV VACCINES  Aged Out   Zoster Vaccines- Shingrix  Discontinued    Health Maintenance  There are no preventive care reminders to display for this patient.   Colorectal cancer screening: Type of screening: Cologuard. Completed 12/16/2021. Repeat every 3 years  Mammogram status: Completed 02/08/2023. Repeat every year  Bone Density status: Completed 11/02/2022. Results reflect: Bone density results: OSTEOPOROSIS. Repeat every 2 years.  Lung Cancer Screening: (Low Dose CT Chest recommended if Age 63-80 years, 30 pack-year currently smoking OR have quit w/in 15years.) does not qualify.   Lung Cancer Screening Referral: No  Additional Screening:  Hepatitis C Screening: does not qualify; Completed: No  Vision Screening: Recommended annual ophthalmology exams for early detection of glaucoma and other disorders of the eye. Is the patient up to date with their  annual eye exam?  Yes  Who is the provider or what is the name of the office in which the patient attends annual eye exams? Sharyne Peach, MD. If pt is not established with a provider, would they like to be referred to a provider to establish care? No .   Dental Screening: Recommended annual dental exams for proper oral hygiene  Community Resource Referral / Chronic Care Management: CRR required this visit?  No   CCM required this visit?  No      Plan:     I have personally reviewed and noted the following in the patient's chart:   Medical and social history Use of alcohol, tobacco or illicit drugs  Current medications and supplements including opioid prescriptions. Patient is not currently taking opioid prescriptions. Functional ability and status Nutritional status Physical activity Advanced directives List of other physicians Hospitalizations, surgeries, and ER visits in previous 12 months Vitals Screenings to include cognitive, depression, and falls Referrals and appointments  In addition, I have reviewed and discussed with patient certain preventive protocols, quality metrics, and best practice recommendations. A written personalized care plan for preventive services as well as general preventive health recommendations were provided to patient.     Sheral Flow, LPN   [id number]   Nurse Notes: Normal cognitive status assessed by direct observation via phone with his Nurse Health Advisor. No abnormalities found.    Medical screening examination/treatment/procedure(s) were performed by non-physician practitioner and as supervising physician I was immediately available for consultation/collaboration.  I agree with above. Lew Dawes, MD

## 2023-03-02 NOTE — Patient Instructions (Addendum)
Ms. Issa , Thank you for taking time to come for your Medicare Wellness Visit. I appreciate your ongoing commitment to your health goals. Please review the following plan we discussed and let me know if I can assist you in the future.   These are the goals we discussed:  Goals      My healthcare goal for 2024 is to maintain my current health status by continuing to eat healthy, stay independent, physically and socially active.        This is a list of the screening recommended for you and due dates:  Health Maintenance  Topic Date Due   COVID-19 Vaccine (9 - 2023-24 season) 07/29/2023*   Flu Shot  06/30/2023   Pap Smear  08/08/2023   Medicare Annual Wellness Visit  03/01/2024   DTaP/Tdap/Td vaccine (3 - Td or Tdap) 06/18/2030   Pneumonia Vaccine  Completed   DEXA scan (bone density measurement)  Completed   HPV Vaccine  Aged Out   Zoster (Shingles) Vaccine  Discontinued  *Topic was postponed. The date shown is not the original due date.    Advanced directives: Yes; documents are on file.  Conditions/risks identified: Yes  Next appointment: Follow up in one year for your annual wellness visit.   Preventive Care 30 Years and Older, Female Preventive care refers to lifestyle choices and visits with your health care provider that can promote health and wellness. What does preventive care include? A yearly physical exam. This is also called an annual well check. Dental exams once or twice a year. Routine eye exams. Ask your health care provider how often you should have your eyes checked. Personal lifestyle choices, including: Daily care of your teeth and gums. Regular physical activity. Eating a healthy diet. Avoiding tobacco and drug use. Limiting alcohol use. Practicing safe sex. Taking low-dose aspirin every day. Taking vitamin and mineral supplements as recommended by your health care provider. What happens during an annual well check? The services and screenings done  by your health care provider during your annual well check will depend on your age, overall health, lifestyle risk factors, and family history of disease. Counseling  Your health care provider may ask you questions about your: Alcohol use. Tobacco use. Drug use. Emotional well-being. Home and relationship well-being. Sexual activity. Eating habits. History of falls. Memory and ability to understand (cognition). Work and work Statistician. Reproductive health. Screening  You may have the following tests or measurements: Height, weight, and BMI. Blood pressure. Lipid and cholesterol levels. These may be checked every 5 years, or more frequently if you are over 102 years old. Skin check. Lung cancer screening. You may have this screening every year starting at age 42 if you have a 30-pack-year history of smoking and currently smoke or have quit within the past 15 years. Fecal occult blood test (FOBT) of the stool. You may have this test every year starting at age 60. Flexible sigmoidoscopy or colonoscopy. You may have a sigmoidoscopy every 5 years or a colonoscopy every 10 years starting at age 34. Hepatitis C blood test. Hepatitis B blood test. Sexually transmitted disease (STD) testing. Diabetes screening. This is done by checking your blood sugar (glucose) after you have not eaten for a while (fasting). You may have this done every 1-3 years. Bone density scan. This is done to screen for osteoporosis. You may have this done starting at age 6. Mammogram. This may be done every 1-2 years. Talk to your health care provider about how  often you should have regular mammograms. Talk with your health care provider about your test results, treatment options, and if necessary, the need for more tests. Vaccines  Your health care provider may recommend certain vaccines, such as: Influenza vaccine. This is recommended every year. Tetanus, diphtheria, and acellular pertussis (Tdap, Td) vaccine. You  may need a Td booster every 10 years. Zoster vaccine. You may need this after age 16. Pneumococcal 13-valent conjugate (PCV13) vaccine. One dose is recommended after age 51. Pneumococcal polysaccharide (PPSV23) vaccine. One dose is recommended after age 57. Talk to your health care provider about which screenings and vaccines you need and how often you need them. This information is not intended to replace advice given to you by your health care provider. Make sure you discuss any questions you have with your health care provider. Document Released: 12/12/2015 Document Revised: 08/04/2016 Document Reviewed: 09/16/2015 Elsevier Interactive Patient Education  2017 [employer] Prevention in the Home Falls can cause injuries. They can happen to people of all ages. There are many things you can do to make your home safe and to help prevent falls. What can I do on the outside of my home? Regularly fix the edges of walkways and driveways and fix any cracks. Remove anything that might make you trip as you walk through a door, such as a raised step or threshold. Trim any bushes or trees on the path to your home. Use bright outdoor lighting. Clear any walking paths of anything that might make someone trip, such as rocks or tools. Regularly check to see if handrails are loose or broken. Make sure that both sides of any steps have handrails. Any raised decks and porches should have guardrails on the edges. Have any leaves, snow, or ice cleared regularly. Use sand or salt on walking paths during winter. Clean up any spills in your garage right away. This includes oil or grease spills. What can I do in the bathroom? Use night lights. Install grab bars by the toilet and in the tub and shower. Do not use towel bars as grab bars. Use non-skid mats or decals in the tub or shower. If you need to sit down in the shower, use a plastic, non-slip stool. Keep the floor dry. Clean up any water that spills  on the floor as soon as it happens. Remove soap buildup in the tub or shower regularly. Attach bath mats securely with double-sided non-slip rug tape. Do not have throw rugs and other things on the floor that can make you trip. What can I do in the bedroom? Use night lights. Make sure that you have a light by your bed that is easy to reach. Do not use any sheets or blankets that are too big for your bed. They should not hang down onto the floor. Have a firm chair that has side arms. You can use this for support while you get dressed. Do not have throw rugs and other things on the floor that can make you trip. What can I do in the kitchen? Clean up any spills right away. Avoid walking on wet floors. Keep items that you use a lot in easy-to-reach places. If you need to reach something above you, use a strong step stool that has a grab bar. Keep electrical cords out of the way. Do not use floor polish or wax that makes floors slippery. If you must use wax, use non-skid floor wax. Do not have throw rugs and other things  on the floor that can make you trip. What can I do with my stairs? Do not leave any items on the stairs. Make sure that there are handrails on both sides of the stairs and use them. Fix handrails that are broken or loose. Make sure that handrails are as long as the stairways. Check any carpeting to make sure that it is firmly attached to the stairs. Fix any carpet that is loose or worn. Avoid having throw rugs at the top or bottom of the stairs. If you do have throw rugs, attach them to the floor with carpet tape. Make sure that you have a light switch at the top of the stairs and the bottom of the stairs. If you do not have them, ask someone to add them for you. What else can I do to help prevent falls? Wear shoes that: Do not have high heels. Have rubber bottoms. Are comfortable and fit you well. Are closed at the toe. Do not wear sandals. If you use a stepladder: Make  sure that it is fully opened. Do not climb a closed stepladder. Make sure that both sides of the stepladder are locked into place. Ask someone to hold it for you, if possible. Clearly mark and make sure that you can see: Any grab bars or handrails. First and last steps. Where the edge of each step is. Use tools that help you move around (mobility aids) if they are needed. These include: Canes. Walkers. Scooters. Crutches. Turn on the lights when you go into a dark area. Replace any light bulbs as soon as they burn out. Set up your furniture so you have a clear path. Avoid moving your furniture around. If any of your floors are uneven, fix them. If there are any pets around you, be aware of where they are. Review your medicines with your doctor. Some medicines can make you feel dizzy. This can increase your chance of falling. Ask your doctor what other things that you can do to help prevent falls. This information is not intended to replace advice given to you by your health care provider. Make sure you discuss any questions you have with your health care provider. Document Released: 09/11/2009 Document Revised: 04/22/2016 Document Reviewed: 12/20/2014 Elsevier Interactive Patient Education  2017 [employer].

## 2023-03-03 ENCOUNTER — Ambulatory Visit: Payer: Medicare Other | Admitting: Sports Medicine

## 2023-03-08 ENCOUNTER — Ambulatory Visit (INDEPENDENT_AMBULATORY_CARE_PROVIDER_SITE_OTHER): Payer: Medicare Other | Admitting: Sports Medicine

## 2023-03-08 VITALS — BP 122/60 | Ht 60.0 in | Wt 116.0 lb

## 2023-03-08 DIAGNOSIS — M5136 Other intervertebral disc degeneration, lumbar region: Secondary | ICD-10-CM

## 2023-03-08 NOTE — Assessment & Plan Note (Signed)
Plan is to try to keep medicines limited: 2 tylenol qid Topical Voltaren Periodic tramadol on bad days  HEP is modified and easy for RT hip  Start Aqua therapy when skin heals

## 2023-03-08 NOTE — Progress Notes (Signed)
PCP: Plotnikov, Georgina Quint, MD  Subjective:   HPI: Patient is a 87 y.o. female here for follow up from her back pain. Had a fall about 6 weeks ago, she thinks it may have something to do with her fall although she also knows she has arthritis as well. Left lower back pain initially that improved with exercises within 10 days. Then she started to have right lower back pain that radiates down her right foot. Occasionally experiences this radiating pain. Back pain occurs daily, describes pain as constant except when she takes tylenol or applies voltaren gel. Describes pain as dull and aching sensation but other times can be sharp. Yesterday was the first day she did not have to take any tylenol, she believes this may be due to the eclipse. Tylenol and voltaren gel helps improve her pain symptoms. Relieving factors include sitting down and resting. Applying a heating pad and alternating with ice helps as well. Still doing stretching exercises. Aggravating factors include standing and walking around. Denies any worsening pain at night or pain that awakes her from sleep. Denies groin paresthesia.     Past Medical History:  Diagnosis Date   Allergic rhinitis    Asthma    Atrophic vaginitis    Baker's cyst    Left-Dr. Aluisio   Cataract    Dr. Dione Booze   CHF (congestive heart failure)    Colon polyps    Fibroid    Gastritis    chronic   GERD (gastroesophageal reflux disease)    Heart murmur    Hemorrhoids    Hyperlipidemia    Hypothyroidism    IBS (irritable bowel syndrome)    Lumbar spondylosis    MVP (mitral valve prolapse)    Antibiotics required for dental procedures   OA (osteoarthritis)    Osteoporosis 06/2018   T score -2.2 stable on Prolia   Overactive bladder    Sacroiliitis    Thyroid disease    Thyroid nodule    Torn meniscus    bilateral    Current Outpatient Medications on File Prior to Visit  Medication Sig Dispense Refill   acetaminophen (TYLENOL) 500 MG tablet Take  1,000 mg by mouth every 6 (six) hours as needed for mild pain.     albuterol (VENTOLIN HFA) 108 (90 Base) MCG/ACT inhaler Take 2 puffs every 4-6 hours as needed     amLODipine (NORVASC) 2.5 MG tablet Take 1 tablet (2.5 mg total) by mouth daily. 30 tablet 11   Azelastine HCl 0.15 % SOLN Place into both nostrils. Place into both nostrils.     Calcium Carbonate-Vitamin D (CALCIUM CARBONATE W/VITAMIN D PO) Take 1 tablet by mouth daily.     desonide (DESOWEN) 0.05 % cream Apply 1 application topically as needed.     diclofenac Sodium (VOLTAREN) 1 % GEL APPLY 4 GRAMS 4 TIMES DAILY. 200 g 3   EPINEPHrine (EPIPEN 2-PAK) 0.3 mg/0.3 mL IJ SOAJ injection Inject 0.3 mLs (0.3 mg total) into the muscle as needed. 1 Device 1   famotidine (PEPCID) 40 MG tablet Take 1 tablet (40 mg total) by mouth at bedtime. NEEDS OFFICE VISIT FOR ADDITIONAL REFILLS 30 tablet 2   Gauze Pads & Dressings (TELFA ADHESIVE DRESSING) 3"X4" PADS Use qd 20 each 0   ipratropium (ATROVENT) 0.06 % nasal spray Place 1 spray into the nose 3 (three) times daily as needed for rhinitis. 15 mL 0   levocetirizine (XYZAL) 5 MG tablet Take 5 mg by mouth every evening.  levothyroxine (SYNTHROID) 50 MCG tablet TAKE ONE TABLET BY MOUTH DAILY BEFORE BREAKFAST 30 tablet 5   LORazepam (ATIVAN) 1 MG tablet Take 2-3 tablets at bedtime and 1-2 tablets in the daytime as needed. 150 tablet 2   memantine (NAMENDA) 5 MG tablet Take 5 mg by mouth 2 (two) times daily.     montelukast (SINGULAIR) 10 MG tablet Take 1 tablet (10 mg total) by mouth at bedtime. 90 tablet 1   polyethylene glycol powder (GLYCOLAX/MIRALAX) 17 GM/SCOOP powder Take 17 g by mouth 2 (two) times daily as needed for moderate constipation. 500 g 3   simvastatin (ZOCOR) 40 MG tablet TAKE ONE TABLET ONCE DAILY 90 tablet 3   traMADol (ULTRAM) 50 MG tablet Take 1 tablet (50 mg total) by mouth every 6 (six) hours as needed for severe pain. 120 tablet 2   triamcinolone (NASACORT) 55 MCG/ACT AERO  nasal inhaler Place 2 sprays into the nose as needed.     VESICARE 10 MG tablet Take 1 tablet (10 mg total) by mouth daily as needed (bladder). 90 tablet 3   No current facility-administered medications on file prior to visit.    Past Surgical History:  Procedure Laterality Date   CATARACT EXTRACTION, BILATERAL     EYE SURGERY     POSTERIOR CERVICAL FUSION/FORAMINOTOMY N/A 05/15/2021   Procedure: Cervical One Laminectomy with resection of cyst, Fixation from Occiput to Cervical Four;  Surgeon: [name], MD;  Location: [facility] OR;  Service: Neurosurgery;  Laterality: N/A;   TONSILLECTOMY      Allergies  Allergen Reactions   Bee Venom Itching, Swelling and Rash    Itching, swelling and rash with bee stings, patient has epi pen   Bacitracin-Polymyxin B Other (See Comments)   Clarithromycin Other (See Comments)   Doxycycline     ??   Neosporin [Neomycin-Bacitracin Zn-Polymyx]    Hydrocortisone Rash    BP 122/60   Ht 5' (1.524 m)   Wt 116 lb (52.6 kg)   BMI 22.65 kg/m       No data to display              No data to display              Objective:  Physical Exam:  Gen: NAD, comfortable in exam room MSK:  Inspection: no gross deformity noted along knees or lumbar spine bilaterally, no erythema or edema noted Palpation: no tenderness to deep palpation of L1-L5, no paraspinal tenderness or step-offs noted ROM: active external and internal rotation intact Strength: 5/5 LE strength bilaterally, gross sensation intact, normal gait and ambulates with assistance of either walker or cane for the majority of the time Special testing: positive straight leg raise on the right, normal FADIR and FABER testing    Assessment & Plan:  1. Left lower back pain: likely multifactorial, secondary to lumbar radiculopathy and sciatica with component of osteoarthritis. Reassuringly rehab exercises have worked to improve pain. No red flag symptoms. New set of rehab exercised discussed and  provided. Continue voltaren gel and tylenol as appropriate for pain relief. Patient may benefit from water therapy as well which she is interested in. Follow up in 1  week to monitor progression.     I observed and examined the patient with the resident and agree with assessment and plan.  Note reviewed and modified by me. Sterling Big, MD

## 2023-03-09 ENCOUNTER — Encounter (HOSPITAL_BASED_OUTPATIENT_CLINIC_OR_DEPARTMENT_OTHER): Payer: Medicare Other | Admitting: Physician Assistant

## 2023-03-09 ENCOUNTER — Ambulatory Visit: Payer: Medicare Other | Admitting: Rheumatology

## 2023-03-09 DIAGNOSIS — G8929 Other chronic pain: Secondary | ICD-10-CM | POA: Diagnosis not present

## 2023-03-09 DIAGNOSIS — E034 Atrophy of thyroid (acquired): Secondary | ICD-10-CM

## 2023-03-09 DIAGNOSIS — J3089 Other allergic rhinitis: Secondary | ICD-10-CM | POA: Diagnosis not present

## 2023-03-09 DIAGNOSIS — E042 Nontoxic multinodular goiter: Secondary | ICD-10-CM

## 2023-03-09 DIAGNOSIS — Z8639 Personal history of other endocrine, nutritional and metabolic disease: Secondary | ICD-10-CM

## 2023-03-09 DIAGNOSIS — S80811A Abrasion, right lower leg, initial encounter: Secondary | ICD-10-CM | POA: Diagnosis not present

## 2023-03-09 DIAGNOSIS — M532X1 Spinal instabilities, occipito-atlanto-axial region: Secondary | ICD-10-CM

## 2023-03-09 DIAGNOSIS — M810 Age-related osteoporosis without current pathological fracture: Secondary | ICD-10-CM | POA: Diagnosis not present

## 2023-03-09 DIAGNOSIS — M7061 Trochanteric bursitis, right hip: Secondary | ICD-10-CM

## 2023-03-09 DIAGNOSIS — I87311 Chronic venous hypertension (idiopathic) with ulcer of right lower extremity: Secondary | ICD-10-CM | POA: Diagnosis not present

## 2023-03-09 DIAGNOSIS — Z8719 Personal history of other diseases of the digestive system: Secondary | ICD-10-CM

## 2023-03-09 DIAGNOSIS — I358 Other nonrheumatic aortic valve disorders: Secondary | ICD-10-CM

## 2023-03-09 DIAGNOSIS — Z86010 Personal history of colonic polyps: Secondary | ICD-10-CM

## 2023-03-09 DIAGNOSIS — I341 Nonrheumatic mitral (valve) prolapse: Secondary | ICD-10-CM

## 2023-03-09 DIAGNOSIS — M19041 Primary osteoarthritis, right hand: Secondary | ICD-10-CM

## 2023-03-09 DIAGNOSIS — L97811 Non-pressure chronic ulcer of other part of right lower leg limited to breakdown of skin: Secondary | ICD-10-CM | POA: Diagnosis not present

## 2023-03-09 DIAGNOSIS — M5030 Other cervical disc degeneration, unspecified cervical region: Secondary | ICD-10-CM

## 2023-03-09 DIAGNOSIS — M5136 Other intervertebral disc degeneration, lumbar region: Secondary | ICD-10-CM

## 2023-03-09 DIAGNOSIS — J4520 Mild intermittent asthma, uncomplicated: Secondary | ICD-10-CM | POA: Diagnosis not present

## 2023-03-09 DIAGNOSIS — T63441D Toxic effect of venom of bees, accidental (unintentional), subsequent encounter: Secondary | ICD-10-CM | POA: Diagnosis not present

## 2023-03-09 DIAGNOSIS — I10 Essential (primary) hypertension: Secondary | ICD-10-CM | POA: Diagnosis not present

## 2023-03-09 DIAGNOSIS — R052 Subacute cough: Secondary | ICD-10-CM | POA: Diagnosis not present

## 2023-03-09 DIAGNOSIS — J301 Allergic rhinitis due to pollen: Secondary | ICD-10-CM | POA: Diagnosis not present

## 2023-03-09 DIAGNOSIS — R2681 Unsteadiness on feet: Secondary | ICD-10-CM

## 2023-03-09 DIAGNOSIS — M19072 Primary osteoarthritis, left ankle and foot: Secondary | ICD-10-CM

## 2023-03-09 NOTE — Progress Notes (Signed)
Inis SizerCOHEN, Laurenashley H ([id number]) [id number] Page 1 of 5 Visit Report for 03/09/2023 Chief Complaint Document Details Patient Name: Date of Service: [name] JohnsCO HEN, Mirtie H. 03/09/2023 10:15 A M Medical Record Number: [id number] Patient Account Number: [id number] Date of Birth/Sex: Treating RN: 08/13/1936 (87 y.o. F) Primary Care Provider: Tresa GarterPlotnikov, Aleksei V Other Clinician: Referring Provider: Treating Provider/Extender: Fabio BeringStone III, Cache Decoursey Plotnikov, Jesse SansAleksei V Weeks in Treatment: 5 Information Obtained from: Patient Chief Complaint 02/03/2023; patient is here from the [facility] with an abrasion injury on her right lower leg Electronic Signature(s) Signed: 03/09/2023 11:11:03 AM By: Allen DerryStone, Tylyn Stankovich PA-C Entered By: Allen DerryStone, Anay Walter on 03/09/2023 11:11:03 -------------------------------------------------------------------------------- HPI Details Patient Name: Date of Service: [name], [name] 03/09/2023 10:15 A M Medical Record Number: [id number] Patient Account Number: [id number] Date of Birth/Sex: Treating RN: 09/30/1936 (87 y.o. F) Primary Care Provider: Tresa GarterPlotnikov, Aleksei V Other Clinician: Referring Provider: Treating Provider/Extender: Ronnette HilaStone III, Bertram Haddix Plotnikov, Aleksei V Weeks in Treatment: 5 History of Present Illness HPI Description: ADMISSION 02/02/23 Mrs. Noel GeroldCohen is an 87 year old woman who lives in the [facility] the [facility]. She has been dealing with a leg wound since February 19 when she traumatized her right leg on a cleaning bucket. She has been using Bactroban and T and an Ace wrap. elfa Chronic low back pain, right hip pain, osteoporosis, hypertension, gait disease disorder she is not a diabetic. 02-16-2023 upon evaluation today patient appears to be doing excellent in regard to her wound although it does look a little bit dry overall I think we are still seeing some improvements here which is great news. Fortunately  I do not see any evidence of active infection locally nor systemically which is great news. No fevers, chills, nausea, vomiting, or diarrhea. 02-23-2023 upon evaluation today patient appears to be doing well currently in regard to her wounds. She actually is doing excellent and in fact the more medial portion of the wound is almost completely closed the lateral portion is much smaller I am very pleased with what we are seeing. 03-02-2023 upon evaluation today patient appears to be doing well currently in regard to her wound was actually showing signs of excellent improvement. Fortunately I do not see any evidence of active infection locally nor systemically which is great news and overall I am extremely pleased with where things stand. There is no sign of infection. No fevers, chills, nausea, vomiting, or diarrhea. 03-09-2023 upon evaluation today patient appears to be doing well currently in regard to her wound. This is actually showing signs of improvement I am actually very pleased with where we stand I think she is continuing to move in the right direction. She is very pleased and happy to hear this. Electronic Signature(s) Signed: 03/09/2023 4:57:04 PM By: Allen DerryStone, Sarahjane Matherly PA-C Entered By: Allen DerryStone, Dashauna Heymann on 03/09/2023 16:57:03 -------------------------------------------------------------------------------- Physical Exam Details Patient Name: Date of Service: [name], [name] 03/09/2023 10:15 A M Medical Record Number: [id number] Patient Account Number: [id number] Date of Birth/Sex: Treating RN: 08/10/1936 (87 y.o. F) Primary Care Provider: Tresa GarterPlotnikov, Aleksei V Other Clinician: Referring Provider: Treating Provider/Extender: Fabio BeringStone III, Sheray Grist Plotnikov, Jesse SansAleksei V Weeks in Treatment: 5 RevereOHEN, Druid HillsRENE H ([id number]) [id number] Page 2 of 5 Constitutional Well-nourished and well-hydrated in no acute distress. Respiratory normal breathing without difficulty. Psychiatric this  patient is able to make decisions and demonstrates good insight into disease process. Alert and Oriented x 3. pleasant and cooperative. Notes Upon inspection patient's wound bed actually showed signs of good granulation epithelization at  this point. Fortunately I do not see any evidence of active infection which is great news and in general I do believe that we are moving in the right direction overall and she is very pleased with that being said. Electronic Signature(s) Signed: 03/09/2023 4:57:26 PM By: [name] PA-C Entered By: [name] on 03/09/2023 16:57:26 -------------------------------------------------------------------------------- Physician Orders Details Patient Name: Date of Service: [name], [name] 03/09/2023 10:15 A M Medical Record Number: [id number] Patient Account Number: [id number] Date of Birth/Sex: Treating RN: 03/07/1936 (87 y.o. Orville Govern Primary Care Provider: [name] Other Clinician: Referring Provider: Treating Provider/Extender: [name], [name] in Treatment: 5 Verbal / Phone Orders: No Diagnosis Coding ICD-10 Coding Code Description L97.811 Non-pressure chronic ulcer of other part of right lower leg limited to breakdown of skin S80.811D Abrasion, right lower leg, subsequent encounter I87.311 Chronic venous hypertension (idiopathic) with ulcer of right lower extremity Follow-up Appointments ppointment in 1 week. Leonard Schwartz Wednesday Room 8 03/16/2023 1230pm Return A Other: - [facility]/ Shower/ Hygiene May shower and wash wound with soap and water. Edema Control - Lymphedema / SCD / Other Bilateral Lower Extremities Avoid standing for long periods of time. Patient to wear own compression stockings every day. - pt to order from [facility]. Exercise regularly Wound Treatment Wound #1 - Lower Leg Wound Laterality: Right, Anterior Cleanser: Soap and Water Every Other Day/30 Days Discharge  Instructions: May shower and wash wound with dial antibacterial soap and water prior to dressing change. Peri-Wound Care: Skin Prep (Generic) Every Other Day/30 Days Discharge Instructions: Use skin prep as directed Prim Dressing: Xeroform Occlusive Gauze Dressing, 4x4 in (Generic) Every Other Day/30 Days ary Discharge Instructions: Apply to wound bed as instructed Secondary Dressing: ALLEVYN Gentle Border, 5x5 (in/in) (Generic) Every Other Day/30 Days Discharge Instructions: Apply over primary dressing as directed. Patient Medications llergies: codeine, Prozac A Notifications Medication Indication Start End prior to debridement 03/09/2023 lidocaine DOSE topical 4 % cream - cream topical once daily MARIACLARA, RAUDABAUGH ([id number]) [id number] Page 3 of 5 Electronic Signature(s) Signed: 03/09/2023 4:43:09 PM By: Redmond Pulling RN, BSN Signed: 03/09/2023 5:51:12 PM By: [name] PA-C Entered By: Redmond Pulling on 03/09/2023 11:13:34 -------------------------------------------------------------------------------- Problem List Details Patient Name: Date of Service: [name], Kaedyn H. 03/09/2023 10:15 A M Medical Record Number: [id number] Patient Account Number: [id number] Date of Birth/Sex: Treating RN: 1936/09/06 (87 y.o. F) Primary Care Provider: [name] Other Clinician: Referring Provider: Treating Provider/Extender: [name] in Treatment: 5 Active Problems ICD-10 Encounter Code Description Active Date MDM Diagnosis L97.811 Non-pressure chronic ulcer of other part of right lower leg limited to breakdown 02/02/2023 No Yes of skin S80.811D Abrasion, right lower leg, subsequent encounter 02/02/2023 No Yes I87.311 Chronic venous hypertension (idiopathic) with ulcer of right lower extremity 02/02/2023 No Yes Inactive Problems Resolved Problems Electronic Signature(s) Signed: 03/09/2023 11:10:58 AM By: [name]  PA-C Entered By: [name] on 03/09/2023 11:10:58 -------------------------------------------------------------------------------- Progress Note Details Patient Name: Date of Service: [name], [name] 03/09/2023 10:15 A M Medical Record Number: [id number] Patient Account Number: [id number] Date of Birth/Sex: Treating RN: 1936/01/02 (87 y.o. F) Primary Care Provider: [name] Other Clinician: Referring Provider: Treating Provider/Extender: [name], [name] in Treatment: 5 Subjective Chief Complaint Information obtained from Patient 02/03/2023; patient is here from the [facility] with an abrasion injury on her right lower leg History of Present Illness (HPI) ADMISSION 02/02/23  Mrs. Studer is an 87 year old woman who lives in the independent [facility] the [facility]. She has been dealing with a leg wound since February 19 when she traumatized her right leg on a cleaning bucket. She has been using Bactroban and T and an Ace wrap. elfa Chronic low back pain, right hip pain, osteoporosis, hypertension, gait disease disorder she is not a diabetic. 02-16-2023 upon evaluation today patient appears to be doing excellent in regard to her wound although it does look a little bit dry overall I think we are still TYINA, HAMMERLE ([id number]) [id number] Page 4 of 5 seeing some improvements here which is great news. Fortunately I do not see any evidence of active infection locally nor systemically which is great news. No fevers, chills, nausea, vomiting, or diarrhea. 02-23-2023 upon evaluation today patient appears to be doing well currently in regard to her wounds. She actually is doing excellent and in fact the more medial portion of the wound is almost completely closed the lateral portion is much smaller I am very pleased with what we are seeing. 03-02-2023 upon evaluation today patient appears to be  doing well currently in regard to her wound was actually showing signs of excellent improvement. Fortunately I do not see any evidence of active infection locally nor systemically which is great news and overall I am extremely pleased with where things stand. There is no sign of infection. No fevers, chills, nausea, vomiting, or diarrhea. 03-09-2023 upon evaluation today patient appears to be doing well currently in regard to her wound. This is actually showing signs of improvement I am actually very pleased with where we stand I think she is continuing to move in the right direction. She is very pleased and happy to hear this. Objective Constitutional Well-nourished and well-hydrated in no acute distress. Vitals Time Taken: 10:08 AM, Temperature: 97.5 F, Pulse: 79 bpm, Respiratory Rate: 18 breaths/min, Blood Pressure: 138/78 mmHg. Respiratory normal breathing without difficulty. Psychiatric this patient is able to make decisions and demonstrates good insight into disease process. Alert and Oriented x 3. pleasant and cooperative. General Notes: Upon inspection patient's wound bed actually showed signs of good granulation epithelization at this point. Fortunately I do not see any evidence of active infection which is great news and in general I do believe that we are moving in the right direction overall and she is very pleased with that being said. Integumentary (Hair, Skin) Wound #1 status is Open. Original cause of wound was Skin T ear/Laceration. The date acquired was: 01/10/2023. The wound has been in treatment 5 weeks. The wound is located on the Right,Anterior Lower Leg. The wound measures 4.6cm length x 1cm width x 0.1cm depth; 3.613cm^2 area and 0.361cm^3 volume. There is Fat Layer (Subcutaneous Tissue) exposed. There is no tunneling or undermining noted. There is a medium amount of serosanguineous drainage noted. The wound margin is distinct with the outline attached to the wound base.  There is large (67-100%) red granulation within the wound bed. There is no necrotic tissue within the wound bed. The periwound skin appearance exhibited: Scarring. The periwound skin appearance did not exhibit: Callus, Crepitus, Excoriation, Induration, Rash, Dry/Scaly, Maceration, Atrophie Blanche, Cyanosis, Ecchymosis, Hemosiderin Staining, Mottled, Pallor, Rubor, Erythema. Periwound temperature was noted as No Abnormality. The periwound has tenderness on palpation. Assessment Active Problems ICD-10 Non-pressure chronic ulcer of other part of right lower leg limited to breakdown of skin Abrasion, right lower leg, subsequent encounter Chronic venous hypertension (idiopathic) with ulcer of right  lower extremity Plan Follow-up Appointments: Return Appointment in 1 week. [facility] Schwartz Wednesday Room 8 03/16/2023 1230pm Other: - [facility]/ Shower/ Hygiene: May shower and wash wound with soap and water. Edema Control - Lymphedema / SCD / Other: Avoid standing for long periods of time. Patient to wear own compression stockings every day. - pt to order from [facility]. Exercise regularly The following medication(s) was prescribed: lidocaine topical 4 % cream cream topical once daily for prior to debridement was prescribed at facility WOUND #1: - Lower Leg Wound Laterality: Right, Anterior Cleanser: Soap and Water Every Other Day/30 Days Discharge Instructions: May shower and wash wound with dial antibacterial soap and water prior to dressing change. Peri-Wound Care: Skin Prep (Generic) Every Other Day/30 Days Discharge Instructions: Use skin prep as directed Prim Dressing: Xeroform Occlusive Gauze Dressing, 4x4 in (Generic) Every Other Day/30 Days ary Discharge Instructions: Apply to wound bed as instructed Secondary Dressing: ALLEVYN Gentle Border, 5x5 (in/in) (Generic) Every Other Day/30 Days Discharge Instructions: Apply over primary dressing as directed. ISABELLAMARIE, RANDA  ([id number]) [id number] Page 5 of 5 1. I am good recommend that we continue with the Xeroform gauze dressing which I think is doing a really good job.. 2. I am also can recommend the patient should continue to monitor for any signs of infection or worsening. Obviously if anything changes she knows contact the office and let me know. We will see patient back for reevaluation in 1 week here in the clinic. If anything worsens or changes patient will contact our office for additional recommendations. Electronic Signature(s) Signed: 03/09/2023 4:57:41 PM By: [name] PA-C Entered By: [name] on 03/09/2023 16:57:41 -------------------------------------------------------------------------------- SuperBill Details Patient Name: Date of Service: [name], [name] 03/09/2023 Medical Record Number: [id number] Patient Account Number: [id number] Date of Birth/Sex: Treating RN: [birth year] (87 y.o. Orville Govern Primary Care Provider: [name] Other Clinician: Referring Provider: Treating Provider/Extender: [name], [name] in Treatment: 5 Diagnosis Coding ICD-10 Codes Code Description [phone] Non-pressure chronic ulcer of other part of right lower leg limited to breakdown of skin S80.811D Abrasion, right lower leg, subsequent encounter I87.311 Chronic venous hypertension (idiopathic) with ulcer of right lower extremity Facility Procedures : CPT4 Code: [id number] Description: 99213 - WOUND CARE VISIT-LEV 3 EST PT Modifier: Quantity: 1 Physician Procedures : CPT4 Code Description Modifier [id number] 99213 - WC PHYS LEVEL 3 - EST PT ICD-10 Diagnosis Description L97.811 Non-pressure chronic ulcer of other part of right lower leg limited to breakdown of skin S80.811D Abrasion, right lower leg, subsequent  encounter I87.311 Chronic venous hypertension (idiopathic) with ulcer of right lower extremity Quantity: 1 Electronic  Signature(s) Signed: 03/09/2023 4:58:06 PM By: [name] PA-C Previous Signature: 03/09/2023 4:43:09 PM Version By: Redmond Pulling RN, BSN Entered By: [name] on 03/09/2023 16:58:06

## 2023-03-09 NOTE — Progress Notes (Signed)
Office Visit Note  Patient: [name]             Date of Birth: 09/06/1936           MRN: [id number]             PCP: [name], MD Referring: [name], MD Visit Date: 03/10/2023 Occupation: @GUAROCC @  Subjective:  Follow-up (Sciatic nerve pain right side)   History of Present Illness: [name] is a 87 y.o. female history of osteoarthritis, degenerative disc disease and osteoporosis.  She states she has been having right-sided sciatica symptoms.  She saw Dr. Darrick Penna and has been doing some exercises at home.  She states the symptoms are gradually improving.  She continues to have some stiffness in her cervical spine.  She states the discomfort in her hands and feet persist but has not changed.  She has been getting Prolia injections through her GYN.  Has been taking calcium and vitamin D on a regular basis.  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  She ambulates with the help of cane.  Patient lost her husband  in October 2023.  She was married to him for 64 years.  She is going through grieving process.    Activities of Daily Living:  Patient reports morning stiffness for denies    .   Patient Reports nocturnal pain.  Difficulty dressing/grooming: Denies Difficulty climbing stairs: Denies Difficulty getting out of chair: Denies Difficulty using hands for taps, buttons, cutlery, and/or writing: Denies  Review of Systems  Constitutional:  Positive for fatigue.  HENT:  Positive for mouth dryness. Negative for mouth sores.   Eyes:  Negative for dryness.  Respiratory:  Negative for shortness of breath.   Cardiovascular:  Negative for chest pain and palpitations.  Gastrointestinal:  Negative for blood in stool, constipation and diarrhea.  Endocrine: Negative for increased urination.  Genitourinary:  Negative for involuntary urination.  Musculoskeletal:  Positive for gait problem. Negative for joint pain, joint pain, joint  swelling, myalgias, muscle weakness, morning stiffness, muscle tenderness and myalgias.  Skin:  Negative for color change, rash, hair loss and sensitivity to sunlight.  Allergic/Immunologic: Negative for susceptible to infections.  Neurological:  Negative for dizziness and headaches.  Hematological:  Negative for swollen glands.  Psychiatric/Behavioral:  Positive for depressed mood. Negative for sleep disturbance. The patient is not nervous/anxious.     PMFS History:  Patient Active Problem List   Diagnosis Date Noted   Leg wound, right, initial encounter 01/17/2023   Pain of right sacroiliac joint 01/14/2023   Lumbar degenerative disc disease 12/21/2022   Chronic left SI joint pain 11/30/2022   Inflammation of sacroiliac joint 11/16/2022   Stool incontinence 11/16/2022   Memory impairment 11/08/2022   Grief 10/06/2022   History of basal cell carcinoma 09/29/2022   Open forehead wound 07/21/2022   Stress at home 07/21/2022   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   Gait disorder 02/28/2022   History of malignant neoplasm of skin 02/22/2022   Melanocytic nevi of trunk 02/22/2022   Seborrheic dermatitis 02/22/2022   Urticaria 02/22/2022   Irregular bowel habits 11/18/2021   Scalp laceration, sequela 11/02/2021   Concussion 10/29/2021   Scalp itch 08/19/2021   Allergic rhinitis due to animal (cat) (dog) hair and dander 08/07/2021   Allergic rhinitis due to pollen 08/07/2021   Mild intermittent asthma 08/07/2021   Closed fracture of first cervical vertebra 08/07/2021   Closed fracture of second  cervical vertebra 08/07/2021   Cervical spine instability 05/15/2021   Closed 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 05/13/2021   Cervical spinal cord compression 05/04/2021   Fall against object 03/25/2021   Hematoma of face, initial encounter 03/25/2021   Degenerative disc disease, cervical 03/11/2021   Spondylolisthesis, cervical region 03/11/2021    Trochanteric bursitis of right hip 09/22/2020   Elevated blood-pressure reading, without diagnosis of hypertension 07/14/2020   Meningioma 03/25/2020   Arthralgia 03/18/2020   RUQ pain 01/02/2020   Cholelithiasis 09/09/2019   Ear pain, bilateral 03/21/2019   Cervical spondylosis 01/05/2019   Neck pain 12/26/2018   Acute pain of left shoulder 12/26/2018   Anxiety 08/22/2018   Contact dermatitis and eczema 06/14/2018   Constipation 11/11/2016   Low back pain 11/10/2016   Leg abrasion 08/12/2016   Contusion of right knee 08/12/2016   Sinusitis, chronic 08/06/2016   Rash and nonspecific skin eruption 04/20/2016   Aortic valve sclerosis 03/12/2015   Contusion of left chest wall 01/09/2015   Fatigue 09/08/2014   Bladder pain 08/19/2014   Increased frequency of urination 08/19/2014   Urinary urgency 08/19/2014   Postherpetic neuralgia 10/02/2013   Impacted cerumen of right ear 07/16/2013   Headache 07/10/2013   Aortic stenosis 04/19/2013   Insomnia 01/24/2013   Cough 08/07/2012   Upper respiratory infection 08/03/2012   Pruritus of skin 03/11/2012   MVP (mitral valve prolapse)    Multiple thyroid nodules 10/04/2011   Herpes zoster 09/07/2011   Essential hypertension, benign 07/27/2011   Atrophic vaginitis    Well adult exam 03/15/2011   Hyperlipemia, mixed 03/15/2011   OAB (overactive bladder) 09/02/2010   Hypothyroidism 03/03/2010   CALF PAIN, LEFT 10/27/2009   Belching 07/21/2009   EAR PAIN 12/19/2008   Allergic rhinitis 12/19/2008   Irritable bowel syndrome 09/04/2008   PERSONAL HX COLONIC POLYPS 09/04/2008   GASTRITIS, CHRONIC 09/03/2008   DUODENITIS, WITHOUT HEMORRHAGE 09/03/2008   TIBIALIS TENDINITIS 03/20/2008   Osteoarthritis 02/25/2008   SWEATING 02/21/2008   LACTOSE INTOLERANCE 11/20/2007   Dyslipidemia 11/20/2007   GERD 11/20/2007   Osteoporosis 11/20/2007    Past Medical History:  Diagnosis Date   Allergic rhinitis    Asthma    Atrophic vaginitis     Baker's cyst    Left-Dr. Aluisio   Cataract    Dr. Dione Booze   CHF (congestive heart failure)    Colon polyps    Fibroid    Gastritis    chronic   GERD (gastroesophageal reflux disease)    Heart murmur    Hemorrhoids    Hyperlipidemia    Hypothyroidism    IBS (irritable bowel syndrome)    Lumbar spondylosis    MVP (mitral valve prolapse)    Antibiotics required for dental procedures   OA (osteoarthritis)    Osteoporosis 06/2018   T score -2.2 stable on Prolia   Overactive bladder    Sacroiliitis    Thyroid disease    Thyroid nodule    Torn meniscus    bilateral    Family History  Problem Relation Age of Onset   Heart disease Mother    Hypertension Mother    Osteoporosis Mother    Congestive Heart Failure Mother    Pancreatic cancer Father    Heart attack Brother    Drug abuse Brother        over dose    Healthy Son    Healthy Son    Colon cancer Neg Hx  Colon polyps Neg Hx    Rectal cancer Neg Hx    Stomach cancer Neg Hx    Past Surgical History:  Procedure Laterality Date   CATARACT EXTRACTION, BILATERAL     EYE SURGERY     POSTERIOR CERVICAL FUSION/FORAMINOTOMY N/A 05/15/2021   Procedure: Cervical One Laminectomy with resection of cyst, Fixation from Occiput to Cervical Four;  Surgeon: [name], MD;  Location: [facility] OR;  Service: Neurosurgery;  Laterality: N/A;   TONSILLECTOMY     Social History   Social History Narrative   Regular exercise-yesDaily caffeine use   Right handed   Lives alone   retired   Immunization History  Administered Date(s) Administered   COVID-19, mRNA, vaccine(Comirnaty)12 years and older 09/29/2022   Fluad Quad(high Dose 65+) 08/04/2019, 09/16/2021, 09/12/2022   Influenza Whole 09/29/2007, 08/21/2008, 09/02/2010, 07/30/2012   Influenza, High Dose Seasonal PF 09/03/2013, 02/19/2015, 11/12/2015, 08/06/2016, 11/11/2016, 08/20/2017, 08/31/2017, 09/20/2018, 09/12/2019   Influenza,inj,Quad PF,6+ Mos 12/17/2014, 07/30/2015    PFIZER(Purple Top)SARS-COV-2 Vaccination 12/12/2019, 12/31/2019, 07/29/2020, 07/29/2020, 07/31/2020   Pfizer Covid-19 Vaccine Bivalent Booster 90yrs & up 08/21/2021   Pneumococcal Conjugate-13 01/09/2014   Pneumococcal Polysaccharide-23 09/26/2006, 08/12/2015, 11/11/2016, 08/31/2017, 09/10/2020, 09/10/2021   Td 04/14/2010   Tdap 06/18/2020   Typhoid Inactivated 02/23/2013   Zoster, Live 12/09/2006     Objective: Vital Signs: BP 119/76 (BP Location: Right Arm, Patient Position: Sitting, Cuff Size: Normal)   Pulse 86   Ht 5' (1.524 m)   Wt 112 lb (50.8 kg)   BMI 21.87 kg/m    Physical Exam Vitals and nursing note reviewed.  Constitutional:      Appearance: She is well-developed.  HENT:     Head: Normocephalic and atraumatic.  Eyes:     Conjunctiva/sclera: Conjunctivae normal.  Cardiovascular:     Rate and Rhythm: Normal rate and regular rhythm.     Heart sounds: Normal heart sounds.  Pulmonary:     Effort: Pulmonary effort is normal.     Breath sounds: Normal breath sounds.  Abdominal:     General: Bowel sounds are normal.     Palpations: Abdomen is soft.  Musculoskeletal:     Cervical back: Normal range of motion.  Lymphadenopathy:     Cervical: No cervical adenopathy.  Skin:    General: Skin is warm and dry.     Capillary Refill: Capillary refill takes less than 2 seconds.  Neurological:     Mental Status: She is alert and oriented to person, place, and time.  Psychiatric:        Behavior: Behavior normal.      Musculoskeletal Exam: She had very limited range of motion of the lumbar spine.  She had limited range of motion of her thoracic and lumbar spine.  She has been having some discomfort in the lower lumbar region.  Shoulder joints, elbow joints, wrist joints with good range of motion.  She had no synovitis of her wrist joints or MCP joints.  She had bilateral PIP and DIP thickening with no synovitis.  Hip joints and knee joints were in good range of motion.  She  had no tenderness over ankles or MTPs.  CDAI Exam: CDAI Score: -- Patient Global: --; Provider Global: -- Swollen: --; Tender: -- Joint Exam 03/10/2023   No joint exam has been documented for this visit   There is currently no information documented on the homunculus. Go to the Rheumatology activity and complete the homunculus joint exam.  Investigation: No additional findings.  Imaging:  ECHOCARDIOGRAM COMPLETE  Result Date: 02/11/2023    ECHOCARDIOGRAM REPORT   Patient Name:   SHATIQUA NGUYEN Date of Exam: 02/11/2023 Medical Rec #:  [id number]     Height:       60.5 in Accession #:    [id number]    Weight:       116.0 lb Date of Birth:  December 14, 1935      BSA:          1.490 m Patient Age:    86 years      BP:           133/76 mmHg Patient Gender: F             HR:           73 bpm. Exam Location:  Outpatient Procedure: 2D Echo, Color Doppler and Cardiac Doppler Indications:    I50.9* Heart failure (unspecified)  History:        Patient has prior history of Echocardiogram examinations, most                 recent 06/25/2022. Risk Factors:Dyslipidemia.  Sonographer:    Irving Burton Senior RDCS Referring Phys: [id number] DANIEL R BENSIMHON IMPRESSIONS  1. Left ventricular ejection fraction, by estimation, is 55 to 60%. The left ventricle has normal function. The left ventricle has no regional wall motion abnormalities. Left ventricular diastolic parameters are consistent with Grade I diastolic dysfunction (impaired relaxation).  2. Right ventricular systolic function is normal. The right ventricular size is normal. There is normal pulmonary artery systolic pressure.  3. The mitral valve is normal in structure. Mild mitral valve regurgitation. No evidence of mitral stenosis.  4. The aortic valve is tricuspid. There is mild calcification of the aortic valve. Aortic valve regurgitation is mild. Aortic valve sclerosis/calcification is present, without any evidence of aortic stenosis. Aortic regurgitation PHT measures 436  msec. Aortic valve area, by VTI measures 1.81 cm. Aortic valve mean gradient measures 5.0 mmHg. Aortic valve Vmax measures 1.45 m/s.  5. The inferior vena cava is normal in size with greater than 50% respiratory variability, suggesting right atrial pressure of 3 mmHg. FINDINGS  Left Ventricle: Left ventricular ejection fraction, by estimation, is 55 to 60%. The left ventricle has normal function. The left ventricle has no regional wall motion abnormalities. The left ventricular internal cavity size was normal in size. There is  no left ventricular hypertrophy. Left ventricular diastolic parameters are consistent with Grade I diastolic dysfunction (impaired relaxation). Right Ventricle: The right ventricular size is normal. No increase in right ventricular wall thickness. Right ventricular systolic function is normal. There is normal pulmonary artery systolic pressure. The tricuspid regurgitant velocity is 2.35 m/s, and  with an assumed right atrial pressure of 3 mmHg, the estimated right ventricular systolic pressure is 25.1 mmHg. Left Atrium: Left atrial size was normal in size. Right Atrium: Right atrial size was normal in size. Pericardium: There is no evidence of pericardial effusion. Mitral Valve: The mitral valve is normal in structure. Mild mitral valve regurgitation. No evidence of mitral valve stenosis. Tricuspid Valve: The tricuspid valve is normal in structure. Tricuspid valve regurgitation is not demonstrated. No evidence of tricuspid stenosis. Aortic Valve: The aortic valve is tricuspid. There is mild calcification of the aortic valve. Aortic valve regurgitation is mild. Aortic regurgitation PHT measures 436 msec. Aortic valve sclerosis/calcification is present, without any evidence of aortic stenosis. Aortic valve mean gradient measures 5.0 mmHg. Aortic valve peak gradient measures 8.4 mmHg. Aortic valve  area, by VTI measures 1.81 cm. Pulmonic Valve: The pulmonic valve was normal in structure.  Pulmonic valve regurgitation is trivial. No evidence of pulmonic stenosis. Aorta: The aortic root is normal in size and structure. Venous: The inferior vena cava is normal in size with greater than 50% respiratory variability, suggesting right atrial pressure of 3 mmHg. IAS/Shunts: No atrial level shunt detected by color flow Doppler.  LEFT VENTRICLE PLAX 2D LVIDd:         4.00 cm   Diastology LVIDs:         2.90 cm   LV e' medial:    5.44 cm/s LV PW:         0.80 cm   LV E/e' medial:  8.4 LV IVS:        0.70 cm   LV e' lateral:   4.90 cm/s LVOT diam:     1.95 cm   LV E/e' lateral: 9.3 LV SV:         51 LV SV Index:   34 LVOT Area:     2.99 cm  RIGHT VENTRICLE RV S prime:     13.50 cm/s TAPSE (M-mode): 1.6 cm LEFT ATRIUM             Index        RIGHT ATRIUM           Index LA diam:        2.60 cm 1.74 cm/m   RA Area:     13.40 cm LA Vol (A2C):   43.7 ml 29.33 ml/m  RA Volume:   30.70 ml  20.60 ml/m LA Vol (A4C):   25.6 ml 17.18 ml/m LA Biplane Vol: 33.1 ml 22.21 ml/m  AORTIC VALVE AV Area (Vmax):    1.78 cm AV Area (Vmean):   1.81 cm AV Area (VTI):     1.81 cm AV Vmax:           145.00 cm/s AV Vmean:          101.000 cm/s AV VTI:            0.282 m AV Peak Grad:      8.4 mmHg AV Mean Grad:      5.0 mmHg LVOT Vmax:         86.60 cm/s LVOT Vmean:        61.200 cm/s LVOT VTI:          0.171 m LVOT/AV VTI ratio: 0.61 AI PHT:            436 msec  AORTA Ao Root diam: 2.90 cm Ao Asc diam:  2.90 cm MITRAL VALVE                  TRICUSPID VALVE MV Area (PHT): 3.48 cm       TR Peak grad:   22.1 mmHg MV Decel Time: 218 msec       TR Vmax:        235.00 cm/s MR Peak grad:    114.5 mmHg MR Mean grad:    64.0 mmHg    SHUNTS MR Vmax:         535.00 cm/s  Systemic VTI:  0.17 m MR Vmean:        360.0 cm/s   Systemic Diam: 1.95 cm MR PISA:         1.01 cm MR PISA Eff ROA: 7 mm MR PISA Radius:  0.40 cm MV E velocity: 45.50 cm/s MV A velocity:  85.10 cm/s MV E/A ratio:  0.53 Arvilla Meres MD Electronically signed by  Arvilla Meres MD Signature Date/Time: 02/11/2023/2:06:19 PM    Final    MM 3D SCREEN BREAST BILATERAL  Result Date: 02/10/2023 CLINICAL DATA:  Screening. EXAM: DIGITAL SCREENING BILATERAL MAMMOGRAM WITH TOMOSYNTHESIS AND CAD TECHNIQUE: Bilateral screening digital craniocaudal and mediolateral oblique mammograms were obtained. Bilateral screening digital breast tomosynthesis was performed. The images were evaluated with computer-aided detection. COMPARISON:  Previous exam(s). ACR Breast Density Category b: There are scattered areas of fibroglandular density. FINDINGS: There are no findings suspicious for malignancy. IMPRESSION: No mammographic evidence of malignancy. A result letter of this screening mammogram will be mailed directly to the patient. RECOMMENDATION: Screening mammogram in one year. (Code:SM-B-01Y) BI-RADS CATEGORY  1: Negative. Electronically Signed   By: Harmon Pier M.D.   On: 02/10/2023 11:16    Recent Labs: Lab Results  Component Value Date   WBC 6.3 12/29/2022   HGB 12.7 12/29/2022   PLT 324.0 12/29/2022   NA 133 (L) 02/10/2023   K 4.0 02/10/2023   CL 96 02/10/2023   CO2 26 02/10/2023   GLUCOSE 97 02/10/2023   BUN 15 02/10/2023   CREATININE 0.61 02/10/2023   BILITOT 0.4 02/10/2023   ALKPHOS 42 02/10/2023   AST 22 02/10/2023   ALT 17 02/10/2023   PROT 7.7 02/10/2023   ALBUMIN 4.3 02/10/2023   CALCIUM 9.7 02/10/2023   GFRAA 87 07/17/2020    Speciality Comments: No specialty comments available.  Procedures:  No procedures performed Allergies: Bee venom, Bacitracin-polymyxin b, Clarithromycin, Doxycycline, Neosporin [neomycin-bacitracin zn-polymyx], and Hydrocortisone   Assessment / Plan:     Visit Diagnoses: Chronic SI joint pain-she continues to have some SI joint discomfort.  Stretching exercises were discussed.  Primary osteoarthritis of both hands-she had bilateral PIP and DIP thickening.  No synovitis was noted.  Joint protection muscle strengthening was  discussed.  Primary osteoarthritis of both feet-proper fitting shoes were discussed.  DDD (degenerative disc disease), cervical - She had fusion in the past by Dr. Venetia Maxon.  She had limited range of motion without radiculopathy.  DDD (degenerative disc disease), lumbar - She states she had relief from epidural injections x 3.  She has been experiencing right-sided sciatica.  She was seen by Dr. Darrick Penna recently.  She had x-rays by Dr. Darrick Penna on February 02, 2023 which were reviewed.  She had grade 1 anterolisthesis of L4 versus L5 and grade 1 retrolisthesis of L1 versus L2 and L2 versus L3.  She had multilevel spondylosis and facet joint arthropathy.  Core strengthening exercises were demonstrated in the office.  Age-related osteoporosis without current pathological fracture - she is on Prolia by her GYN.  Gait instability-she ambulates with the help of a cane.  She has been doing some gait stability exercises.  Some of the exercises were demonstrated in the office today.  History of hyperlipidemia  History of gastroesophageal reflux (GERD)  History of IBS  Essential hypertension, benign-blood pressure was normal at 119/76.  Hypothyroidism due to acquired atrophy of thyroid  Grieving-patient lost her husband in October 2023.  There were married for 64 years.  She has been going through grieving process.  Orders: No orders of the defined types were placed in this encounter.  No orders of the defined types were placed in this encounter.   Follow-Up Instructions: Return in about 6 months (around 09/09/2023).   Pollyann Savoy, MD  Note - This record has been created using Animal nutritionist.  Chart  creation errors have been sought, but may not always  have been located. Such creation errors do not reflect on  the standard of medical care.

## 2023-03-10 ENCOUNTER — Ambulatory Visit: Payer: Medicare Other | Attending: Rheumatology | Admitting: Rheumatology

## 2023-03-10 ENCOUNTER — Encounter: Payer: Self-pay | Admitting: Rheumatology

## 2023-03-10 VITALS — BP 119/76 | HR 86 | Ht 60.0 in | Wt 112.0 lb

## 2023-03-10 DIAGNOSIS — M19071 Primary osteoarthritis, right ankle and foot: Secondary | ICD-10-CM | POA: Insufficient documentation

## 2023-03-10 DIAGNOSIS — E034 Atrophy of thyroid (acquired): Secondary | ICD-10-CM | POA: Diagnosis not present

## 2023-03-10 DIAGNOSIS — M5136 Other intervertebral disc degeneration, lumbar region: Secondary | ICD-10-CM | POA: Insufficient documentation

## 2023-03-10 DIAGNOSIS — M533 Sacrococcygeal disorders, not elsewhere classified: Secondary | ICD-10-CM | POA: Insufficient documentation

## 2023-03-10 DIAGNOSIS — F4321 Adjustment disorder with depressed mood: Secondary | ICD-10-CM | POA: Insufficient documentation

## 2023-03-10 DIAGNOSIS — R2681 Unsteadiness on feet: Secondary | ICD-10-CM | POA: Diagnosis not present

## 2023-03-10 DIAGNOSIS — M19072 Primary osteoarthritis, left ankle and foot: Secondary | ICD-10-CM | POA: Insufficient documentation

## 2023-03-10 DIAGNOSIS — M19041 Primary osteoarthritis, right hand: Secondary | ICD-10-CM | POA: Diagnosis not present

## 2023-03-10 DIAGNOSIS — Z8639 Personal history of other endocrine, nutritional and metabolic disease: Secondary | ICD-10-CM | POA: Insufficient documentation

## 2023-03-10 DIAGNOSIS — I10 Essential (primary) hypertension: Secondary | ICD-10-CM | POA: Diagnosis not present

## 2023-03-10 DIAGNOSIS — M19042 Primary osteoarthritis, left hand: Secondary | ICD-10-CM | POA: Insufficient documentation

## 2023-03-10 DIAGNOSIS — M810 Age-related osteoporosis without current pathological fracture: Secondary | ICD-10-CM | POA: Diagnosis not present

## 2023-03-10 DIAGNOSIS — Z8719 Personal history of other diseases of the digestive system: Secondary | ICD-10-CM | POA: Insufficient documentation

## 2023-03-10 DIAGNOSIS — M5030 Other cervical disc degeneration, unspecified cervical region: Secondary | ICD-10-CM | POA: Diagnosis not present

## 2023-03-10 DIAGNOSIS — G8929 Other chronic pain: Secondary | ICD-10-CM | POA: Insufficient documentation

## 2023-03-11 NOTE — Progress Notes (Signed)
[name], [name] ([id number]) [id number] Page 1 of 7 Visit Report for 03/09/2023 Arrival Information Details Patient Name: Date of Service: [name], [name] 03/09/2023 10:15 A M Medical Record Number: [id number] Patient Account Number: [id number] Date of Birth/Sex: Treating RN: Nov 21, 1936 (87 y.o. F) Primary Care Emberlynn Riggan: [name] Other Clinician: Referring Joelle Roswell: Treating Elanor Cale/Extender: [name] in Treatment: 5 Visit Information History Since Last Visit Added or deleted any medications: No Patient Arrived: Dan Humphreys Any new allergies or adverse reactions: No Arrival Time: 10:06 Had a fall or experienced change in No Accompanied By: self activities of daily living that may affect Transfer Assistance: None risk of falls: Patient Identification Verified: Yes Signs or symptoms of abuse/neglect since last visito No Secondary Verification Process Completed: Yes Hospitalized since last visit: No Patient Requires Transmission-Based Precautions: No Implantable device outside of the clinic excluding No Patient Has Alerts: No cellular tissue based products placed in the center since last visit: Has Dressing in Place as Prescribed: Yes Pain Present Now: No Electronic Signature(s) Signed: 03/11/2023 12:27:54 PM By: Thayer Dallas Entered By: Thayer Dallas on 03/09/2023 10:07:10 -------------------------------------------------------------------------------- Clinic Level of Care Assessment Details Patient Name: Date of Service: CO JEANELLE, DAKE 03/09/2023 10:15 A M Medical Record Number: [id number] Patient Account Number: [id number] Date of Birth/Sex: Treating RN: Nov 24, 1936 (87 y.o. Orville Govern Primary Care Axiel Fjeld: [name] Other Clinician: Referring Ayda Tancredi: Treating Makiyla Linch/Extender: [name], [name] in Treatment: 5 Clinic Level of Care Assessment Items TOOL 4  Quantity Score X- 1 0 Use when only an EandM is performed on FOLLOW-UP visit ASSESSMENTS - Nursing Assessment / Reassessment X- 1 10 Reassessment of Co-morbidities (includes updates in patient status) X- 1 5 Reassessment of Adherence to Treatment Plan ASSESSMENTS - Wound and Skin A ssessment / Reassessment X - Simple Wound Assessment / Reassessment - one wound 1 5  - 0 Complex Wound Assessment / Reassessment - multiple wounds  - 0 Dermatologic / Skin Assessment (not related to wound area) ASSESSMENTS - Focused Assessment X- 1 5 Circumferential Edema Measurements - multi extremities  - 0 Nutritional Assessment / Counseling / Intervention  - 0 Lower Extremity Assessment (monofilament, tuning fork, pulses)  - 0 Peripheral Arterial Disease Assessment (using hand held doppler) ASSESSMENTS - Ostomy and/or Continence Assessment and Care  - 0 Incontinence Assessment and Management  - 0 Ostomy Care Assessment and Management (repouching, etc.) PROCESS - Coordination of Care X - Simple Patient / Family Education for ongoing care 1 15 Lindsay, [place] ([id number]) [id number] Page 2 of 7  - 0 Complex (extensive) Patient / Family Education for ongoing care X- 1 10 Staff obtains Chiropractor, Records, T Results / Process Orders est  - 0 Staff telephones HHA, Nursing Homes / Clarify orders / etc  - 0 Routine Transfer to another Facility (non-emergent condition)  - 0 Routine Hospital Admission (non-emergent condition)  - 0 New Admissions / Manufacturing engineer / Ordering NPWT Apligraf, etc. ,  - 0 Emergency Hospital Admission (emergent condition)  - 0 Simple Discharge Coordination  - 0 Complex (extensive) Discharge Coordination PROCESS - Special Needs  - 0 Pediatric / Minor Patient Management  - 0 Isolation Patient Management  - 0 Hearing / Language / Visual special needs  - 0 Assessment of Community assistance  (transportation, D/C planning, etc.)  - 0 Additional assistance / Altered mentation  - 0 Support Surface(s) Assessment (bed, cushion, seat, etc.) INTERVENTIONS - Wound Cleansing / Measurement X -  Simple Wound Cleansing - one wound 1 5 []  - 0 Complex Wound Cleansing - multiple wounds X- 1 5 Wound Imaging (photographs - any number of wounds) []  - 0 Wound Tracing (instead of photographs) X- 1 5 Simple Wound Measurement - one wound []  - 0 Complex Wound Measurement - multiple wounds INTERVENTIONS - Wound Dressings X - Small Wound Dressing one or multiple wounds 1 10 []  - 0 Medium Wound Dressing one or multiple wounds []  - 0 Large Wound Dressing one or multiple wounds []  - 0 Application of Medications - topical []  - 0 Application of Medications - injection INTERVENTIONS - Miscellaneous []  - 0 External ear exam []  - 0 Specimen Collection (cultures, biopsies, blood, body fluids, etc.) []  - 0 Specimen(s) / Culture(s) sent or taken to Lab for analysis []  - 0 Patient Transfer (multiple staff / Nurse, adult / Similar devices) []  - 0 Simple Staple / Suture removal (25 or less) []  - 0 Complex Staple / Suture removal (26 or more) []  - 0 Hypo / Hyperglycemic Management (close monitor of Blood Glucose) []  - 0 Ankle / Brachial Index (ABI) - do not check if billed separately X- 1 5 Vital Signs Has the patient been seen at the hospital within the last three years: Yes Total Score: 80 Level Of Care: New/Established - Level 3 Electronic Signature(s) Signed: 03/09/2023 4:43:09 PM By: Redmond Pulling RN, BSN Entered By: Redmond Pulling on 03/09/2023 11:20:12 [name] ([id number]) [id number] Page 3 of 7 -------------------------------------------------------------------------------- Encounter Discharge Information Details Patient Name: Date of Service: [name], [name] 03/09/2023 10:15 A M Medical Record Number: [id number] Patient Account Number:  [id number] Date of Birth/Sex: Treating RN: 10-28-1936 (87 y.o. Orville Govern Primary Care Khalea Ventura: [name] Other Clinician: Referring Maddix Heinz: Treating Shakelia Scrivner/Extender: [name], [name] in Treatment: 5 Encounter Discharge Information Items Discharge Condition: Stable Ambulatory Status: Walker Discharge Destination: Home Transportation: Private Auto Accompanied By: self Schedule Follow-up Appointment: Yes Clinical Summary of Care: Patient Declined Electronic Signature(s) Signed: 03/09/2023 4:43:09 PM By: Redmond Pulling RN, BSN Entered By: Redmond Pulling on 03/09/2023 11:19:34 -------------------------------------------------------------------------------- Lower Extremity Assessment Details Patient Name: Date of Service: [name], Rojean H. 03/09/2023 10:15 A M Medical Record Number: [id number] Patient Account Number: [id number] Date of Birth/Sex: Treating RN: 1935-12-17 (87 y.o. F) Primary Care Kavion Mancinas: [name] Other Clinician: Referring Sanders Manninen: Treating Teodoro Jeffreys/Extender: [name], Georgina Quint Weeks in Treatment: 5 Edema Assessment Assessed: [Left: No] [Right: No] [Left: Edema] [Right: :] Calf Left: Right: Point of Measurement: 28 cm From Medial Instep 33.5 cm Ankle Left: Right: Point of Measurement: 10 cm From Medial Instep 20 cm Electronic Signature(s) Signed: 03/11/2023 12:27:54 PM By: Thayer Dallas Entered By: Thayer Dallas on 03/09/2023 10:14:18 -------------------------------------------------------------------------------- Multi-Disciplinary Care Plan Details Patient Name: Date of Service: [name], Caera H. 03/09/2023 10:15 A M Medical Record Number: [id number] Patient Account Number: [id number] Date of Birth/Sex: Treating RN: [birth year] (87 y.o. Orville Govern Primary Care Ronella Plunk: [name] Other Clinician: Referring Everrett Lacasse: Treating Lorrane Mccay/Extender: [name], [name] in Treatment: [address] BLINDA, BAUMHARDT ([id number]) [id number] Page 4 of 7 Nursing Diagnoses: Impaired tissue integrity Knowledge deficit related to ulceration/compromised skin integrity Goals: Patient/caregiver will verbalize understanding of skin care regimen Date Initiated: 02/02/2023 Target Resolution Date: 04/01/2023 Goal Status: Active Ulcer/skin breakdown will have a volume reduction of 30% by week 4 Date Initiated: 02/02/2023 Date Inactivated: 03/02/2023 Target Resolution Date: 03/02/2023 Goal  Status: Met Interventions: Assess patient/caregiver ability to obtain necessary supplies Assess patient/caregiver ability to perform ulcer/skin care regimen upon admission and as needed Assess ulceration(s) every visit Provide education on ulcer and skin care Notes: Electronic Signature(s) Signed: 03/09/2023 4:43:09 PM By: Redmond Pulling RN, BSN Entered By: Redmond Pulling on 03/09/2023 11:13:47 -------------------------------------------------------------------------------- Pain Assessment Details Patient Name: Date of Service: [name], Shaquaya H. 03/09/2023 10:15 A M Medical Record Number: [id number] Patient Account Number: [id number] Date of Birth/Sex: Treating RN: 02-20-1936 (87 y.o. F) Primary Care Geralyn Figiel: [name] Other Clinician: Referring Jiro Kiester: Treating Joni Colegrove/Extender: [name] in Treatment: 5 Active Problems Location of Pain Severity and Description of Pain Patient Has Paino No Site Locations Pain Management and Medication Current Pain Management: Electronic Signature(s) Signed: 03/11/2023 12:27:54 PM By: Thayer Dallas Entered By: Thayer Dallas on 03/09/2023 10:09:09 -------------------------------------------------------------------------------- Patient/Caregiver Education Details Patient Name: Date of Service: [name], [name]  4/10/2024andnbsp10:15 A M Abadi, Jezreel H ([id number]) [id number] Page 5 of 7 Medical Record Number: [id number] Patient Account Number: [id number] Date of Birth/Gender: Treating RN: 05/03/1936 (87 y.o. Orville Govern Primary Care Physician: [name] Other Clinician: Referring Physician: Treating Physician/Extender: [name], [name] in Treatment: 5 Education Assessment Education Provided To: Patient Education Topics Provided Wound/Skin Impairment: Methods: Explain/Verbal Responses: State content correctly Electronic Signature(s) Signed: 03/09/2023 4:43:09 PM By: Redmond Pulling RN, BSN Entered By: Redmond Pulling on 03/09/2023 11:14:03 -------------------------------------------------------------------------------- Wound Assessment Details Patient Name: Date of Service: [name], Jatasia H. 03/09/2023 10:15 A M Medical Record Number: [id number] Patient Account Number: [id number] Date of Birth/Sex: Treating RN: 08-03-1936 (87 y.o. F) Primary Care Kip Kautzman: [name] Other Clinician: Referring Ioane Bhola: Treating Elzy Tomasello/Extender: [name], [name] in Treatment: 5 Wound Status Wound Number: 1 Primary Etiology: Abrasion Wound Location: Right, Anterior Lower Leg Wound Status: Open Wounding Event: Skin Tear/Laceration Comorbid Cataracts, Asthma, Congestive Heart Failure, History: Osteoarthritis Date Acquired: 01/10/2023 Weeks Of Treatment: 5 Clustered Wound: Yes Photos Wound Measurements Length: (cm) Width: (cm) Depth: (cm) Clustered Quantity: Area: (cm) Volume: (cm) 4.6 % Reduction in Area: 87.8% 1 % Reduction in Volume: 87.8% 0.1 Epithelialization: Small (1-33%) 1 Tunneling: No 3.613 Undermining: No 0.361 Wound Description Classification: Full Thickness Without Exposed Su Wound Margin: Distinct, outline attached Exudate Amount: Medium Exudate Type: Serosanguineous Exudate  Color: red, brown pport Structures Foul Odor After Cleansing: No Slough/Fibrino Yes Wound Bed [name], [name] ([id number]) [phone].pdf Page 6 of 7 Granulation Amount: Large (67-100%) Exposed Structure Granulation Quality: Red Fascia Exposed: No Necrotic Amount: None Present (0%) Fat Layer (Subcutaneous Tissue) Exposed: Yes Tendon Exposed: No Muscle Exposed: No Joint Exposed: No Bone Exposed: No Periwound Skin Texture Texture Color No Abnormalities Noted: No No Abnormalities Noted: No Callus: No Atrophie Blanche: No Crepitus: No Cyanosis: No Excoriation: No Ecchymosis: No Induration: No Erythema: No Rash: No Hemosiderin Staining: No Scarring: Yes Mottled: No Pallor: No Moisture Rubor: No No Abnormalities Noted: No Dry / Scaly: No Temperature / Pain Maceration: No Temperature: No Abnormality Tenderness on Palpation: Yes Treatment Notes Wound #1 (Lower Leg) Wound Laterality: Right, Anterior Cleanser Soap and Water Discharge Instruction: May shower and wash wound with dial antibacterial soap and water prior to dressing change. Peri-Wound Care Skin Prep Discharge Instruction: Use skin prep as directed Topical Primary Dressing Xeroform Occlusive Gauze Dressing, 4x4 in Discharge Instruction: Apply to wound bed as instructed Secondary Dressing ALLEVYN Gentle Border, 5x5 (in/in) Discharge Instruction: Apply over primary dressing as directed.  Secured With Compression Wrap Compression Stockings Add-Ons Electronic Signature(s) Signed: 03/11/2023 12:27:54 PM By: Thayer Dallas Entered By: Thayer Dallas on 03/09/2023 10:16:51 -------------------------------------------------------------------------------- Vitals Details Patient Name: Date of Service: [name], Milisa H. 03/09/2023 10:15 A M Medical Record Number: [id number] Patient Account Number: [id number] Date of Birth/Sex: Treating RN: 06-01-1936 (87 y.o. F) Primary Care Lemmie Steinhaus: [name] Other Clinician: Referring Ezzard Ditmer: Treating Nani Ingram/Extender: [name], [name] in Treatment: 5 Vital Signs Time Taken: 10:08 Temperature (F): 97.5 Pulse (bpm): 79 Respiratory Rate (breaths/min): 18 Blood Pressure (mmHg): 138/78 Reference Range: 80 - 120 mg / dl [name], [name] ([id number]) [id number] Page 7 of 7 Electronic Signature(s) Signed: 03/11/2023 12:27:54 PM By: Thayer Dallas Entered By: Thayer Dallas on 03/09/2023 10:08:55

## 2023-03-15 DIAGNOSIS — N3281 Overactive bladder: Secondary | ICD-10-CM | POA: Diagnosis not present

## 2023-03-15 DIAGNOSIS — R350 Frequency of micturition: Secondary | ICD-10-CM | POA: Diagnosis not present

## 2023-03-15 DIAGNOSIS — R3915 Urgency of urination: Secondary | ICD-10-CM | POA: Diagnosis not present

## 2023-03-16 ENCOUNTER — Encounter (HOSPITAL_BASED_OUTPATIENT_CLINIC_OR_DEPARTMENT_OTHER): Payer: Medicare Other | Admitting: Physician Assistant

## 2023-03-16 ENCOUNTER — Other Ambulatory Visit (INDEPENDENT_AMBULATORY_CARE_PROVIDER_SITE_OTHER): Payer: Medicare Other

## 2023-03-16 ENCOUNTER — Other Ambulatory Visit: Payer: Medicare Other

## 2023-03-16 DIAGNOSIS — E034 Atrophy of thyroid (acquired): Secondary | ICD-10-CM | POA: Diagnosis not present

## 2023-03-16 DIAGNOSIS — S80811A Abrasion, right lower leg, initial encounter: Secondary | ICD-10-CM | POA: Diagnosis not present

## 2023-03-16 DIAGNOSIS — M810 Age-related osteoporosis without current pathological fracture: Secondary | ICD-10-CM | POA: Diagnosis not present

## 2023-03-16 DIAGNOSIS — R413 Other amnesia: Secondary | ICD-10-CM | POA: Diagnosis not present

## 2023-03-16 DIAGNOSIS — R3915 Urgency of urination: Secondary | ICD-10-CM | POA: Diagnosis not present

## 2023-03-16 DIAGNOSIS — G8929 Other chronic pain: Secondary | ICD-10-CM | POA: Diagnosis not present

## 2023-03-16 DIAGNOSIS — L97811 Non-pressure chronic ulcer of other part of right lower leg limited to breakdown of skin: Secondary | ICD-10-CM | POA: Diagnosis not present

## 2023-03-16 DIAGNOSIS — I10 Essential (primary) hypertension: Secondary | ICD-10-CM | POA: Diagnosis not present

## 2023-03-16 DIAGNOSIS — S81811A Laceration without foreign body, right lower leg, initial encounter: Secondary | ICD-10-CM | POA: Diagnosis not present

## 2023-03-16 DIAGNOSIS — I87311 Chronic venous hypertension (idiopathic) with ulcer of right lower extremity: Secondary | ICD-10-CM | POA: Diagnosis not present

## 2023-03-16 LAB — CBC WITH DIFFERENTIAL/PLATELET
Basophils Absolute: 0 10*3/uL (ref 0.0–0.1)
Basophils Relative: 0.7 % (ref 0.0–3.0)
Eosinophils Absolute: 0.1 10*3/uL (ref 0.0–0.7)
Eosinophils Relative: 1.5 % (ref 0.0–5.0)
HCT: 39.1 % (ref 36.0–46.0)
Hemoglobin: 13.4 g/dL (ref 12.0–15.0)
Lymphocytes Relative: 18.8 % (ref 12.0–46.0)
Lymphs Abs: 1.1 10*3/uL (ref 0.7–4.0)
MCHC: 34.2 g/dL (ref 30.0–36.0)
MCV: 94.3 fl (ref 78.0–100.0)
Monocytes Absolute: 0.7 10*3/uL (ref 0.1–1.0)
Monocytes Relative: 11.4 % (ref 3.0–12.0)
Neutro Abs: 4 10*3/uL (ref 1.4–7.7)
Neutrophils Relative %: 67.6 % (ref 43.0–77.0)
Platelets: 342 10*3/uL (ref 150.0–400.0)
RBC: 4.14 Mil/uL (ref 3.87–5.11)
RDW: 13.9 % (ref 11.5–15.5)
WBC: 6 10*3/uL (ref 4.0–10.5)

## 2023-03-16 LAB — COMPREHENSIVE METABOLIC PANEL
ALT: 26 U/L (ref 0–35)
AST: 20 U/L (ref 0–37)
Albumin: 4.8 g/dL (ref 3.5–5.2)
Alkaline Phosphatase: 36 U/L — ABNORMAL LOW (ref 39–117)
BUN: 13 mg/dL (ref 6–23)
CO2: 26 mEq/L (ref 19–32)
Calcium: 9.6 mg/dL (ref 8.4–10.5)
Chloride: 96 mEq/L (ref 96–112)
Creatinine, Ser: 0.72 mg/dL (ref 0.40–1.20)
GFR: 76.63 mL/min (ref >60.00)
Glucose, Bld: 95 mg/dL (ref 70–99)
Potassium: 4.4 mEq/L (ref 3.5–5.1)
Sodium: 132 mEq/L — ABNORMAL LOW (ref 135–145)
Total Bilirubin: 0.6 mg/dL (ref 0.2–1.2)
Total Protein: 8.1 g/dL (ref 6.0–8.3)

## 2023-03-16 LAB — LIPID PANEL
Cholesterol: 216 mg/dL — ABNORMAL HIGH (ref 0–200)
HDL: 64.2 mg/dL (ref >39.00)
LDL Cholesterol: 122 mg/dL — ABNORMAL HIGH (ref 0–99)
NonHDL: 151.49
Total CHOL/HDL Ratio: 3
Triglycerides: 146 mg/dL (ref 0.0–149.0)
VLDL: 29.2 mg/dL (ref 0.0–40.0)

## 2023-03-16 LAB — URINALYSIS
Bilirubin Urine: NEGATIVE
Hgb urine dipstick: NEGATIVE
Ketones, ur: NEGATIVE
Leukocytes,Ua: NEGATIVE
Nitrite: NEGATIVE
Specific Gravity, Urine: 1.01 (ref 1.000–1.030)
Total Protein, Urine: NEGATIVE
Urine Glucose: NEGATIVE
Urobilinogen, UA: 0.2 (ref 0.0–1.0)
pH: 7 (ref 5.0–8.0)

## 2023-03-16 LAB — TSH: TSH: 1.29 u[IU]/mL (ref 0.35–5.50)

## 2023-03-17 ENCOUNTER — Telehealth: Payer: Self-pay | Admitting: Internal Medicine

## 2023-03-17 ENCOUNTER — Ambulatory Visit (INDEPENDENT_AMBULATORY_CARE_PROVIDER_SITE_OTHER): Payer: Medicare Other | Admitting: Sports Medicine

## 2023-03-17 VITALS — BP 134/64 | Ht 60.0 in | Wt 115.0 lb

## 2023-03-17 DIAGNOSIS — M5030 Other cervical disc degeneration, unspecified cervical region: Secondary | ICD-10-CM | POA: Diagnosis not present

## 2023-03-17 DIAGNOSIS — M5136 Other intervertebral disc degeneration, lumbar region: Secondary | ICD-10-CM

## 2023-03-17 NOTE — Progress Notes (Signed)
HADLYNN, DENKE ([id number]) [id number] Page 1 of 5 Visit Report for 03/16/2023 Chief Complaint Document Details Patient Name: Date of Service: [name], [name] 03/16/2023 12:30 PM Medical Record Number: [id number] Patient Account Number: [id number] Date of Birth/Sex: Treating RN: [name] (87 y.o. F) Primary Care Provider: [name] Other Clinician: Referring Provider: Treating Provider/Extender: [name], [name] in Treatment: 6 Information Obtained from: Patient Chief Complaint 02/03/2023; patient is here from the [facility] with an abrasion injury on her right lower leg Electronic Signature(s) Signed: 03/16/2023 12:55:45 PM By: [name] PA-C Entered By: [name] on 03/16/2023 12:55:45 -------------------------------------------------------------------------------- HPI Details Patient Name: Date of Service: CO [name], [name]. 03/16/2023 12:30 PM Medical Record Number: [id number] Patient Account Number: [id number] Date of Birth/Sex: Treating RN: [name] (87 y.o. F) Primary Care Provider: [name] Other Clinician: Referring Provider: Treating Provider/Extender: [name] in Treatment: 6 History of Present Illness HPI Description: ADMISSION 02/02/23 [name] is an 87 year old woman who lives in the [facility] the [facility]. She has been dealing with a leg wound since February 19 when she traumatized her right leg on a cleaning bucket. She has been using Bactroban and T and an Ace wrap. elfa Chronic low back pain, right hip pain, osteoporosis, hypertension, gait disease disorder she is not a diabetic. 02-16-2023 upon evaluation today patient appears to be doing excellent in regard to her wound although it does look a little bit dry overall I think we are still seeing some improvements here which is great news. Fortunately I  do not see any evidence of active infection locally nor systemically which is great news. No fevers, chills, nausea, vomiting, or diarrhea. 02-23-2023 upon evaluation today patient appears to be doing well currently in regard to her wounds. She actually is doing excellent and in fact the more medial portion of the wound is almost completely closed the lateral portion is much smaller I am very pleased with what we are seeing. 03-02-2023 upon evaluation today patient appears to be doing well currently in regard to her wound was actually showing signs of excellent improvement. Fortunately I do not see any evidence of active infection locally nor systemically which is great news and overall I am extremely pleased with where things stand. There is no sign of infection. No fevers, chills, nausea, vomiting, or diarrhea. 03-09-2023 upon evaluation today patient appears to be doing well currently in regard to her wound. This is actually showing signs of improvement I am actually very pleased with where we stand I think she is continuing to move in the right direction. She is very pleased and happy to hear this. 03-16-2023 upon evaluation today patient appears to be doing well with regard to her wounds visually and I do feel like she is making good progress here. Fortunately there does not appear to be any signs of active infection locally nor systemically which is great news. No fevers, chills, nausea, vomiting, or diarrhea. Electronic Signature(s) Signed: 03/16/2023 6:26:50 PM By: [name] PA-C Entered By: [name] on 03/16/2023 18:26:49 -------------------------------------------------------------------------------- Physical Exam Details Patient Name: Date of Service: CO [name], [name]. 03/16/2023 12:30 PM Medical Record Number: [id number] Patient Account Number: [id number] Date of Birth/Sex: Treating RN: [name] (87 y.o. [name], [name] ([id number]) [id number] Page 2 of  5 Primary Care Provider: [name] Other Clinician: Referring Provider: Treating Provider/Extender: [name], Georgina Quint Weeks in Treatment: 6  Constitutional Well-nourished and well-hydrated in no acute distress. Respiratory normal breathing without difficulty. Psychiatric this patient is able to make decisions and demonstrates good insight into disease process. Alert and Oriented x 3. pleasant and cooperative. Notes Upon inspection patient's wound bed actually showed signs of good granulation and epithelization at this point. Fortunately I do not see any evidence of active infection locally nor systemically which is great news and overall I do believe that we are moving in the right direction here. I think that we are still going to stick with the Xeroform for now. Electronic Signature(s) Signed: 03/16/2023 6:27:12 PM By: [name] PA-C Entered By: [name] on 03/16/2023 18:27:12 -------------------------------------------------------------------------------- Physician Orders Details Patient Name: Date of Service: CO [name], [name]. 03/16/2023 12:30 PM Medical Record Number: [id number] Patient Account Number: [id number] Date of Birth/Sex: Treating RN: [name] (87 y.o. [name] Primary Care Provider: [name] Other Clinician: Referring Provider: Treating Provider/Extender: [name], [name] in Treatment: 6 Verbal / Phone Orders: No Diagnosis Coding Follow-up Appointments ppointment in 1 week. - Dr. Leonard Schwartz in Room 8 Return A Other: - [facility]/ Shower/ Hygiene May shower and wash wound with soap and water. Edema Control - Lymphedema / SCD / Other Bilateral Lower Extremities Avoid standing for long periods of time. Patient to wear own compression stockings every day. - pt to order from [facility]. Exercise regularly Wound Treatment Wound #1 - Lower Leg Wound Laterality: Right,  Anterior Cleanser: Soap and Water Every Other Day/30 Days Discharge Instructions: May shower and wash wound with dial antibacterial soap and water prior to dressing change. Peri-Wound Care: Skin Prep (Generic) Every Other Day/30 Days Discharge Instructions: Use skin prep as directed Prim Dressing: Xeroform Occlusive Gauze Dressing, 4x4 in (Generic) Every Other Day/30 Days ary Discharge Instructions: Apply to wound bed as instructed Secondary Dressing: ALLEVYN Gentle Border, 5x5 (in/in) (Generic) Every Other Day/30 Days Discharge Instructions: Apply over primary dressing as directed. Electronic Signature(s) Signed: 03/16/2023 6:42:55 PM By: [name] PA-C Signed: 03/17/2023 1:56:54 PM By: Brenton Grills Entered By: Brenton Grills on 03/16/2023 12:49:59 DENITRA, DONAGHEY ([id number]) [id number] Page 3 of 5 -------------------------------------------------------------------------------- Problem List Details Patient Name: Date of Service: [name], [name] 03/16/2023 12:30 PM Medical Record Number: [id number] Patient Account Number: [id number] Date of Birth/Sex: Treating RN: [name] (87 y.o. [name] Primary Care Provider: [name] Other Clinician: Referring Provider: Treating Provider/Extender: [name], [name] in Treatment: 6 Active Problems ICD-10 Encounter Code Description Active Date MDM Diagnosis L97.811 Non-pressure chronic ulcer of other part of right lower leg limited to breakdown 02/02/2023 No Yes of skin S80.811D Abrasion, right lower leg, subsequent encounter 02/02/2023 No Yes I87.311 Chronic venous hypertension (idiopathic) with ulcer of right lower extremity 02/02/2023 No Yes Inactive Problems Resolved Problems Electronic Signature(s) Signed: 03/16/2023 12:55:27 PM By: [name] PA-C Entered By: [name] on 03/16/2023  12:55:27 -------------------------------------------------------------------------------- Progress Note Details Patient Name: Date of Service: CO [name], [name]. 03/16/2023 12:30 PM Medical Record Number: [id number] Patient Account Number: [id number] Date of Birth/Sex: Treating RN: [name], [name] (87 y.o. F) Primary Care Provider: [name] Other Clinician: Referring Provider: Treating Provider/Extender: [name], [name] in Treatment: 6 Subjective Chief Complaint Information obtained from Patient 02/03/2023; patient is here from the [facility] with an abrasion injury on her right lower leg History of Present Illness (HPI) ADMISSION 02/02/23 [name] is an 87 year old woman who lives in the  [facility] part [facility] the [facility]. She has been dealing with a leg wound since February 19 when she traumatized her right leg on a cleaning bucket. She has been using Bactroban and T and an Ace wrap. elfa Chronic low back pain, right hip pain, osteoporosis, hypertension, gait disease disorder she is not a diabetic. 02-16-2023 upon evaluation today patient appears to be doing excellent in regard to her wound although it does look a little bit dry overall I think we are still seeing some improvements here which is great news. Fortunately I do not see any evidence of active infection locally nor systemically which is great news. No fevers, chills, nausea, vomiting, or diarrhea. 02-23-2023 upon evaluation today patient appears to be doing well currently in regard to her wounds. She actually is doing excellent and in fact the more medial portion of the wound is almost completely closed the lateral portion is much smaller I am very pleased with what we are seeing. 03-02-2023 upon evaluation today patient appears to be doing well currently in regard to her wound was actually showing signs of excellent improvement. Fortunately I do not see any  evidence of active infection locally nor systemically which is great news and overall I am extremely pleased with where things stand. There is no sign of infection. No fevers, chills, nausea, vomiting, or diarrhea. 03-09-2023 upon evaluation today patient appears to be doing well currently in regard to her wound. This is actually showing signs of improvement I am actually very pleased with where we stand I think she is continuing to move in the right direction. She is very pleased and happy to hear this. [name], [name] ([id number]) [id number] Page 4 of 5 03-16-2023 upon evaluation today patient appears to be doing well with regard to her wounds visually and I do feel like she is making good progress here. Fortunately there does not appear to be any signs of active infection locally nor systemically which is great news. No fevers, chills, nausea, vomiting, or diarrhea. Objective Constitutional Well-nourished and well-hydrated in no acute distress. Vitals Time Taken: 12:36 PM, Temperature: 98.1 F, Pulse: 97 bpm, Respiratory Rate: 18 breaths/min, Blood Pressure: 129/78 mmHg. Respiratory normal breathing without difficulty. Psychiatric this patient is able to make decisions and demonstrates good insight into disease process. Alert and Oriented x 3. pleasant and cooperative. General Notes: Upon inspection patient's wound bed actually showed signs of good granulation and epithelization at this point. Fortunately I do not see any evidence of active infection locally nor systemically which is great news and overall I do believe that we are moving in the right direction here. I think that we are still going to stick with the Xeroform for now. Integumentary (Hair, Skin) Wound #1 status is Open. Original cause of wound was Skin T ear/Laceration. The date acquired was: 01/10/2023. The wound has been in treatment 6 weeks. The wound is located on the Right,Anterior Lower Leg. The  wound measures 4.4cm length x 1.1cm width x 0.1cm depth; 3.801cm^2 area and 0.38cm^3 volume. There is Fat Layer (Subcutaneous Tissue) exposed. There is no tunneling or undermining noted. There is a medium amount of serosanguineous drainage noted. The wound margin is distinct with the outline attached to the wound base. There is large (67-100%) red granulation within the wound bed. There is no necrotic tissue within the wound bed. The periwound skin appearance exhibited: Scarring. The periwound skin appearance did not exhibit: Callus, Crepitus, Excoriation, Induration, Rash, Dry/Scaly, Maceration, Atrophie Blanche, Cyanosis, Ecchymosis, Hemosiderin  Staining, Mottled, Pallor, Rubor, Erythema. Periwound temperature was noted as No Abnormality. The periwound has tenderness on palpation. Assessment Active Problems ICD-10 Non-pressure chronic ulcer of other part of right lower leg limited to breakdown of skin Abrasion, right lower leg, subsequent encounter Chronic venous hypertension (idiopathic) with ulcer of right lower extremity Plan Follow-up Appointments: Return Appointment in 1 week. - Dr. Leonard Schwartz in Room 8 Other: - [facility]/ Shower/ Hygiene: May shower and wash wound with soap and water. Edema Control - Lymphedema / SCD / Other: Avoid standing for long periods of time. Patient to wear own compression stockings every day. - pt to order from [facility]. Exercise regularly WOUND #1: - Lower Leg Wound Laterality: Right, Anterior Cleanser: Soap and Water Every Other Day/30 Days Discharge Instructions: May shower and wash wound with dial antibacterial soap and water prior to dressing change. Peri-Wound Care: Skin Prep (Generic) Every Other Day/30 Days Discharge Instructions: Use skin prep as directed Prim Dressing: Xeroform Occlusive Gauze Dressing, 4x4 in (Generic) Every Other Day/30 Days ary Discharge Instructions: Apply to wound bed as instructed Secondary Dressing: ALLEVYN  Gentle Border, 5x5 (in/in) (Generic) Every Other Day/30 Days Discharge Instructions: Apply over primary dressing as directed. 1. I would suggest that the patient should continue to monitor for any evidence of infection or worsening. Obviously if anything changes she knows contact the office let me know. 2. I am good recommend as well that she should stick with specifically the Xeroform which I think is doing a great job. We will see patient back for reevaluation in 1 week here in the clinic. If anything worsens or changes patient will contact our office for additional recommendations. [name], [name] ([id number]) [id number] Page 5 of 5 Electronic Signature(s) Signed: 03/16/2023 6:27:29 PM By: [name] PA-C Entered By: [name] on 03/16/2023 18:27:28 -------------------------------------------------------------------------------- SuperBill Details Patient Name: Date of Service: CO [name], [name]. 03/16/2023 Medical Record Number: [id number] Patient Account Number: [id number] Date of Birth/Sex: Treating RN: [birth year], [name] (87 y.o. [name] Primary Care Provider: [name] Other Clinician: Referring Provider: Treating Provider/Extender: [name], [name] in Treatment: 6 Diagnosis Coding ICD-10 Codes Code Description [phone] Non-pressure chronic ulcer of other part of right lower leg limited to breakdown of skin S80.811D Abrasion, right lower leg, subsequent encounter I87.311 Chronic venous hypertension (idiopathic) with ulcer of right lower extremity Facility Procedures : CPT4 Code: [id number] Description: 99213 - WOUND CARE VISIT-LEV 3 EST PT Modifier: 25 Quantity: 1 Physician Procedures : CPT4 Code Description Modifier [id number] - WC PHYS LEVEL 3 - EST PT ICD-10 Diagnosis Description L97.811 Non-pressure chronic ulcer of other part of right lower leg limited to breakdown of skin S80.811D Abrasion, right lower leg,  subsequent  encounter I87.311 Chronic venous hypertension (idiopathic) with ulcer of right lower extremity Quantity: 1 Electronic Signature(s) Signed: 03/16/2023 6:27:40 PM By: [name] PA-C Entered By: [name] on 03/16/2023 18:27:39

## 2023-03-17 NOTE — Progress Notes (Signed)
LEIMOMI, ZERVAS ([id number]) [id number] Page 1 of 5 Visit Report for 02/16/2023 Arrival Information Details Patient Name: Date of Service: [name], [name] 02/16/2023 11:15 A M Medical Record Number: [id number] Patient Account Number: [id number] Date of Birth/Sex: Treating RN: [birth year] (87 y.o. [name] Primary Care Nilza Eaker: [name] Other Clinician: Referring Kerah Hardebeck: Treating Elspeth Blucher/Extender: [name], [name] in Treatment: 2 Visit Information History Since Last Visit All ordered tests and consults were completed: Yes Patient Arrived: Dan Humphreys Added or deleted any medications: No Arrival Time: 11:36 Any new allergies or adverse reactions: No Accompanied By: self Had a fall or experienced change in No Transfer Assistance: None activities of daily living that may affect Patient Identification Verified: Yes risk of falls: Secondary Verification Process Completed: Yes Signs or symptoms of abuse/neglect since last visito No Patient Requires Transmission-Based Precautions: No Hospitalized since last visit: No Patient Has Alerts: No Implantable device outside of the clinic excluding No cellular tissue based products placed in the center since last visit: Has Dressing in Place as Prescribed: Yes Pain Present Now: Yes Electronic Signature(s) Signed: 03/17/2023 1:55:51 PM By: Brenton Grills Entered By: Brenton Grills on 02/16/2023 11:37:48 -------------------------------------------------------------------------------- Encounter Discharge Information Details Patient Name: Date of Service: [name], [name] 02/16/2023 11:15 A M Medical Record Number: [id number] Patient Account Number: [id number] Date of Birth/Sex: Treating RN: 07/10/1936 (87 y.o. [name] Primary Care Sheria Rosello: [name] Other Clinician: Referring Amarionna Arca: Treating Jeniyah Menor/Extender: [name]  in Treatment: 2 Encounter Discharge Information Items Post Procedure Vitals Discharge Condition: Stable Temperature (F): 96 Ambulatory Status: Walker Pulse (bpm): 84 Discharge Destination: Home Respiratory Rate (breaths/min): 18 Transportation: Private Auto Blood Pressure (mmHg): 166/78 Accompanied By: self Schedule Follow-up Appointment: Yes Clinical Summary of Care: Patient Declined Electronic Signature(s) Signed: 03/17/2023 1:55:51 PM By: Brenton Grills Entered By: Brenton Grills on 02/16/2023 12:02:25 -------------------------------------------------------------------------------- Lower Extremity Assessment Details Patient Name: Date of Service: [name], [name] 02/16/2023 11:15 A M Medical Record Number: [id number] Patient Account Number: [id number] Date of Birth/Sex: Treating RN: 06-Jan-1936 (87 y.o. [name] Primary Care Zylpha Poynor: [name] Other Clinician: Referring Todd Jelinski: Treating Jaline Pincock/Extender: [name], [name] in Treatment: 2 Edema Assessment C[Left: [name], [name] ([id number])] [Right: [id number] Page 2 of 5] Assessed: [Left: No] [Right: No] [Left: Edema] [Right: :] Calf Left: Right: Point of Measurement: 28 cm From Medial Instep 29 cm Ankle Left: Right: Point of Measurement: 10 cm From Medial Instep 19 cm Vascular Assessment Pulses: Dorsalis Pedis Palpable: [Right:Yes] Electronic Signature(s) Signed: 03/17/2023 1:55:51 PM By: Brenton Grills Entered By: Brenton Grills on 02/16/2023 11:39:05 -------------------------------------------------------------------------------- Multi-Disciplinary Care Plan Details Patient Name: Date of Service: [name], [name] 02/16/2023 11:15 A M Medical Record Number: [id number] Patient Account Number: [id number] Date of Birth/Sex: Treating RN: [birth year] (87 y.o. [name] Primary Care Grayton Lobo: [name] Other Clinician: Referring  Olga Bourbeau: Treating Arbie Reisz/Extender: [name] in Treatment: 2 Active Inactive Wound/Skin Impairment Nursing Diagnoses: Impaired tissue integrity Knowledge deficit related to ulceration/compromised skin integrity Goals: Patient/caregiver will verbalize understanding of skin care regimen Date Initiated: 02/02/2023 Target Resolution Date: 03/02/2023 Goal Status: Active Ulcer/skin breakdown will have a volume reduction of 30% by week 4 Date Initiated: 02/02/2023 Target Resolution Date: 03/02/2023 Goal Status: Active Interventions: Assess patient/caregiver ability to obtain necessary supplies Assess patient/caregiver ability to perform ulcer/skin care regimen upon admission and as needed Assess ulceration(s) every visit Provide  education on ulcer and skin care Notes: Electronic Signature(s) Signed: 03/17/2023 1:55:51 PM By: Brenton Grills Entered By: Brenton Grills on 02/16/2023 11:48:13 -------------------------------------------------------------------------------- Pain Assessment Details Patient Name: Date of Service: [name], [name] 02/16/2023 11:15 A M Medical Record Number: [id number] Patient Account Number: [id number] Date of Birth/Sex: Treating RN: October 27, 1936 (87 y.o. [name] Primary Care Nicodemus Denk: [name] Other Clinician: Referring Jarad Barth: Treating Jovane Foutz/Extender: Theola Sequin, [name] ([id number]) [id number] Page 3 of 5 Weeks in Treatment: 2 Active Problems Location of Pain Severity and Description of Pain Patient Has Paino Yes Site Locations With Dressing Change: Yes Pain Management and Medication Current Pain Management: Electronic Signature(s) Signed: 03/17/2023 1:55:51 PM By: Brenton Grills Entered By: Brenton Grills on 02/16/2023 11:38:42 -------------------------------------------------------------------------------- Patient/Caregiver Education  Details Patient Name: Date of Service: [name], [name] 3/20/2024andnbsp11:15 A M Medical Record Number: [id number] Patient Account Number: [id number] Date of Birth/Gender: Treating RN: [birth year] (87 y.o. [name] Primary Care Physician: [name] Other Clinician: Referring Physician: Treating Physician/Extender: [name], [name] in Treatment: 2 Education Assessment Education Provided To: Patient Education Topics Provided Pain: Handouts: A Guide to Pain Control Methods: Explain/Verbal Responses: Reinforcements needed, State content correctly Wound/Skin Impairment: Handouts: Caring for Your Ulcer Methods: Explain/Verbal Responses: Reinforcements needed, State content correctly Electronic Signature(s) Signed: 03/17/2023 1:55:51 PM By: Brenton Grills Entered By: Brenton Grills on 02/16/2023 11:48:57 Wound Assessment Details -------------------------------------------------------------------------------- [name] ([id number]) [id number] Page 4 of 5 Patient Name: Date of Service: [name], [name] 02/16/2023 11:15 A M Medical Record Number: [id number] Patient Account Number: [id number] Date of Birth/Sex: Treating RN: 24-Jun-1936 (87 y.o. [name] Primary Care Kevis Qu: [name] Other Clinician: Referring Navon Kotowski: Treating Zyair Russi/Extender: [name], [name] in Treatment: 2 Wound Status Wound Number: 1 Primary Etiology: Abrasion Wound Location: Right, Anterior Lower Leg Wound Status: Open Wounding Event: Skin Tear/Laceration Comorbid Cataracts, Asthma, Congestive Heart Failure, History: Osteoarthritis Date Acquired: 01/10/2023 Weeks Of Treatment: 2 Clustered Wound: Yes Photos Wound Measurements Length: (cm) 5.7 % Reduction in Area: 21.6% Width: (cm) 5.2 % Reduction in Volume: 21.6% Depth: (cm) 0.1 Epithelialization: Small (1-33%) Clustered Quantity:  2 Tunneling: No Area: (cm) 23.279 Undermining: No Volume: (cm) 2.328 Wound Description Classification: Full Thickness Without Exposed Support Structures Foul Odor After Cleansing: No Wound Margin: Distinct, outline attached Slough/Fibrino Yes Exudate Amount: Medium Exudate Type: Serosanguineous Exudate Color: red, brown Wound Bed Granulation Amount: Large (67-100%) Exposed Structure Granulation Quality: Red Fascia Exposed: No Necrotic Amount: Small (1-33%) Fat Layer (Subcutaneous Tissue) Exposed: Yes Necrotic Quality: Adherent Slough Tendon Exposed: No Muscle Exposed: No Joint Exposed: No Bone Exposed: No Periwound Skin Texture Texture Color No Abnormalities Noted: No No Abnormalities Noted: No Callus: No Atrophie Blanche: No Crepitus: No Cyanosis: No Excoriation: No Ecchymosis: No Induration: No Erythema: No Rash: No Hemosiderin Staining: No Scarring: Yes Mottled: No Pallor: No Moisture Rubor: No No Abnormalities Noted: No Dry / Scaly: No Temperature / Pain Maceration: No Temperature: No Abnormality Tenderness on Palpation: Yes Electronic Signature(s) Signed: 03/17/2023 1:55:51 PM By: Brenton Grills Entered By: Brenton Grills on 02/16/2023 11:43:49 [name] ([id number]) [id number] Page 5 of 5 -------------------------------------------------------------------------------- Vitals Details Patient Name: Date of Service: [name], [name] 02/16/2023 11:15 A M Medical Record Number: [id number] Patient Account Number: [id number] Date of Birth/Sex: Treating RN: [birth year] (87 y.o. [name] Primary Care Markeis Allman: [name] Other Clinician: Referring Sherrod Toothman: Treating Eliani Leclere/Extender: Linwood Dibbles,  Hoyt Plotnikov, Georgina Quint Weeks in Treatment: 2 Vital Signs Time Taken: 11:37 Temperature (F): 96 Pulse (bpm): 84 Respiratory Rate (breaths/min): 18 Blood Pressure (mmHg): 166/78 Reference Range: 80 - 120 mg /  dl Electronic Signature(s) Signed: 03/17/2023 1:55:51 PM By: Brenton Grills Entered By: Brenton Grills on 02/16/2023 11:38:21

## 2023-03-17 NOTE — Progress Notes (Signed)
Chief complaint low back and right hip pain and some upper back pain  Patient was seen 10 days ago and given a series of exercises to strengthen her right hip abductors and help relieve right SI joint pain She states the exercises have worked extremely well She has stopped taking any tramadol In her room she is walking without her cane but she does take a cane with her when she is outside  The secondary thing is come up is tightness over her trapezius on both sides She is not sure what triggered this but is doing some exercise classes  Physical exam Pleasant elderly female in no acute distress BP 134/64   Ht 5' (1.524 m)   Wt 115 lb (52.2 kg)   BMI 22.46 kg/m   She has tightness over both trapezius muscles Neck extension is limited and she has had a previous fusion Upper extremity strength is normal  Hip rotary motion is pain-free today She shows improved ability to do lateral walking and crossover steps Her gait is significantly improved

## 2023-03-17 NOTE — Assessment & Plan Note (Signed)
While I think most of her symptoms come from her lumbar degenerative joint disease and her SI joint problems on the right, the best treatment seems today to give her good rehabilitation exercises. She is doing well with the last set of home exercises. We have modified these to her for her age.  She is to continue these and I will continue to monitor her in about a month.

## 2023-03-17 NOTE — Telephone Encounter (Signed)
Patient would like to know if she should take a covid booster.  They are giving the April 25th.  Please call patient and let her know.  Phone:  [phone]

## 2023-03-17 NOTE — Assessment & Plan Note (Signed)
This is probably triggering some of her trapezius tightness  I gave her a series of exercises to help relax the trapezius and to improve her scapular stabilization We will see how this goes on follow-up

## 2023-03-17 NOTE — Progress Notes (Signed)
ANASTASSIA, NOACK ([id number]) [id number] Page 1 of 7 Visit Report for 03/16/2023 Arrival Information Details Patient Name: Date of Service: [name], [name] 03/16/2023 12:30 PM Medical Record Number: [id number] Patient Account Number: [id number] Date of Birth/Sex: Treating RN: [birth year] (87 y.o. [name] Primary Care Annaleigha Woo: [name] Other Clinician: Referring Christine Morton: Treating Shuntavia Yerby/Extender: [name], [name] in Treatment: 6 Visit Information History Since Last Visit All ordered tests and consults were completed: Yes Patient Arrived: Dan Humphreys Added or deleted any medications: No Arrival Time: 12:35 Any new allergies or adverse reactions: No Accompanied By: self Had a fall or experienced change in No Transfer Assistance: None activities of daily living that may affect Patient Identification Verified: Yes risk of falls: Secondary Verification Process Completed: Yes Signs or symptoms of abuse/neglect since last visito No Patient Requires Transmission-Based Precautions: No Hospitalized since last visit: No Patient Has Alerts: No Implantable device outside of the clinic excluding No cellular tissue based products placed in the center since last visit: Has Dressing in Place as Prescribed: Yes Pain Present Now: No Electronic Signature(s) Signed: 03/17/2023 1:56:54 PM By: Brenton Grills Entered By: Brenton Grills on 03/16/2023 12:36:42 -------------------------------------------------------------------------------- Clinic Level of Care Assessment Details Patient Name: Date of Service: [name] [name], [name] WREDE 03/16/2023 12:30 PM Medical Record Number: [id number] Patient Account Number: [id number] Date of Birth/Sex: Treating RN: 18-Nov-1936 (87 y.o. [name] Primary Care Kartel Wolbert: [name] Other Clinician: Referring Corinna Burkman: Treating Roston Grunewald/Extender: [name], [name] in  Treatment: 6 Clinic Level of Care Assessment Items TOOL 4 Quantity Score X- 1 0 Use when only an EandM is performed on FOLLOW-UP visit ASSESSMENTS - Nursing Assessment / Reassessment X- 1 10 Reassessment of [name]-morbidities (includes updates in patient status) X- 1 5 Reassessment of Adherence to Treatment Plan ASSESSMENTS - Wound and Skin A ssessment / Reassessment X - Simple Wound Assessment / Reassessment - one wound 1 5  - 0 Complex Wound Assessment / Reassessment - multiple wounds  - 0 Dermatologic / Skin Assessment (not related to wound area) ASSESSMENTS - Focused Assessment  - 0 Circumferential Edema Measurements - multi extremities  - 0 Nutritional Assessment / Counseling / Intervention  - 0 Lower Extremity Assessment (monofilament, tuning fork, pulses)  - 0 Peripheral Arterial Disease Assessment (using hand held doppler) ASSESSMENTS - Ostomy and/or Continence Assessment and Care  - 0 Incontinence Assessment and Management  - 0 Ostomy Care Assessment and Management (repouching, etc.) PROCESS - Coordination of Care [name], [name] ([id number]) [id number] Page 2 of 7 X- 1 15 Simple Patient / Family Education for ongoing care  - 0 Complex (extensive) Patient / Family Education for ongoing care X- 1 10 Staff obtains Chiropractor, Records, T Results / Process Orders est  - 0 Staff telephones HHA, Nursing Homes / Clarify orders / etc  - 0 Routine Transfer to another Facility (non-emergent condition)  - 0 Routine Hospital Admission (non-emergent condition)  - 0 New Admissions / Manufacturing engineer / Ordering NPWT Apligraf, etc. ,  - 0 Emergency Hospital Admission (emergent condition) X- 1 10 Simple Discharge Coordination  - 0 Complex (extensive) Discharge Coordination PROCESS - Special Needs  - 0 Pediatric / Minor Patient Management  - 0 Isolation Patient Management  - 0 Hearing / Language / Visual  special needs  - 0 Assessment of Community assistance (transportation, D/C planning, etc.)  - 0 Additional assistance / Altered mentation  - 0 Support Surface(s) Assessment (bed, cushion, seat, etc.)  INTERVENTIONS - Wound Cleansing / Measurement X - Simple Wound Cleansing - one wound 1 5 []  - 0 Complex Wound Cleansing - multiple wounds X- 1 5 Wound Imaging (photographs - any number of wounds) []  - 0 Wound Tracing (instead of photographs) X- 1 5 Simple Wound Measurement - one wound []  - 0 Complex Wound Measurement - multiple wounds INTERVENTIONS - Wound Dressings []  - 0 Small Wound Dressing one or multiple wounds X- 1 15 Medium Wound Dressing one or multiple wounds []  - 0 Large Wound Dressing one or multiple wounds []  - 0 Application of Medications - topical []  - 0 Application of Medications - injection INTERVENTIONS - Miscellaneous []  - 0 External ear exam []  - 0 Specimen Collection (cultures, biopsies, blood, body fluids, etc.) []  - 0 Specimen(s) / Culture(s) sent or taken to Lab for analysis []  - 0 Patient Transfer (multiple staff / Nurse, adult / Similar devices) []  - 0 Simple Staple / Suture removal (25 or less) []  - 0 Complex Staple / Suture removal (26 or more) []  - 0 Hypo / Hyperglycemic Management (close monitor of Blood Glucose) []  - 0 Ankle / Brachial Index (ABI) - do not check if billed separately X- 1 5 Vital Signs Has the patient been seen at the hospital within the last three years: Yes Total Score: 90 Level Of Care: New/Established - Level 3 Electronic Signature(s) Signed: 03/17/2023 1:56:54 PM By: Oren Bracket, [name] ([id number]) [id number] Page 3 of 7 Entered By: Brenton Grills on 03/16/2023 13:11:06 -------------------------------------------------------------------------------- Encounter Discharge Information Details Patient Name: Date of Service: [name] [name], [name] 03/16/2023 12:30 PM Medical Record  Number: [id number] Patient Account Number: [id number] Date of Birth/Sex: Treating RN: November 27, 1936 (87 y.o. [name] Primary Care Dannilynn Gallina: [name] Other Clinician: Referring Grisell Bissette: Treating Chrishun Scheer/Extender: [name], [name] in Treatment: 6 Encounter Discharge Information Items Discharge Condition: Stable Ambulatory Status: Walker Discharge Destination: Home Transportation: Private Auto Accompanied By: self Schedule Follow-up Appointment: Yes Clinical Summary of Care: Patient Declined Electronic Signature(s) Signed: 03/17/2023 1:56:54 PM By: Brenton Grills Entered By: Brenton Grills on 03/16/2023 13:12:15 -------------------------------------------------------------------------------- Lower Extremity Assessment Details Patient Name: Date of Service: [name] [name], [name] AMPARO 03/16/2023 12:30 PM Medical Record Number: [id number] Patient Account Number: [id number] Date of Birth/Sex: Treating RN: 1936/06/29 (87 y.o. [name] Primary Care Phebe Dettmer: [name] Other Clinician: Referring Zariah Jost: Treating Woodie Trusty/Extender: [name], Georgina Quint Weeks in Treatment: 6 Edema Assessment Assessed: [Left: No] [Right: No] [Left: Edema] [Right: :] Calf Left: Right: Point of Measurement: 28 cm From Medial Instep 32 cm Ankle Left: Right: Point of Measurement: 10 cm From Medial Instep 19.8 cm Vascular Assessment Pulses: Dorsalis Pedis Palpable: [Right:Yes] Electronic Signature(s) Signed: 03/17/2023 1:56:54 PM By: Brenton Grills Entered By: Brenton Grills on 03/16/2023 12:48:38 -------------------------------------------------------------------------------- Multi-Disciplinary Care Plan Details Patient Name: Date of Service: [name] [name], [name] H. 03/16/2023 12:30 PM Medical Record Number: [id number] Patient Account Number: [id number] Date of Birth/Sex: Treating RN: 1936/07/30 (87 y.o. [name] Primary Care  Catelyn Friel: [name] Other Clinician: Referring Adelard Sanon: Treating Breylon Sherrow/Extender: [name] Arn ([id number]) [id number] Page 4 of 7 Weeks in Treatment: 6 Active Inactive Wound/Skin Impairment Nursing Diagnoses: Impaired tissue integrity Knowledge deficit related to ulceration/compromised skin integrity Goals: Patient/caregiver will verbalize understanding of skin care regimen Date Initiated: 02/02/2023 Target Resolution Date: 04/01/2023 Goal Status: Active Ulcer/skin breakdown will have a volume reduction of 30% by week 4 Date Initiated:  02/02/2023 Date Inactivated: 03/02/2023 Target Resolution Date: 03/02/2023 Goal Status: Met Interventions: Assess patient/caregiver ability to obtain necessary supplies Assess patient/caregiver ability to perform ulcer/skin care regimen upon admission and as needed Assess ulceration(s) every visit Provide education on ulcer and skin care Notes: Electronic Signature(s) Signed: 03/17/2023 1:56:54 PM By: Brenton Grills Entered By: Brenton Grills on 03/16/2023 12:50:16 -------------------------------------------------------------------------------- Pain Assessment Details Patient Name: Date of Service: [name] [name], [name] H. 03/16/2023 12:30 PM Medical Record Number: [id number] Patient Account Number: [id number] Date of Birth/Sex: Treating RN: [birth year] (87 y.o. [name] Primary Care Vivia Rosenburg: [name] Other Clinician: Referring Zandrea Kenealy: Treating Trissa Molina/Extender: [name] in Treatment: 6 Active Problems Location of Pain Severity and Description of Pain Patient Has Paino No Site Locations Pain Management and Medication Current Pain Management: Electronic Signature(s) Signed: 03/17/2023 1:56:54 PM By: Oren Bracket, [name] ([id number]) [id number] Page 5 of 7 Entered By: Brenton Grills on  03/16/2023 12:37:40 -------------------------------------------------------------------------------- Patient/Caregiver Education Details Patient Name: Date of Service: [name] [name], BEYNON 4/17/2024andnbsp12:30 PM Medical Record Number: [id number] Patient Account Number: [id number] Date of Birth/Gender: Treating RN: 05/14/1936 (87 y.o. [name] Primary Care Physician: [name] Other Clinician: Referring Physician: Treating Physician/Extender: [name], [name] in Treatment: 6 Education Assessment Education Provided To: Patient Education Topics Provided Wound/Skin Impairment: Methods: Explain/Verbal Responses: State content correctly Electronic Signature(s) Signed: 03/17/2023 1:56:54 PM By: Brenton Grills Entered By: Brenton Grills on 03/16/2023 12:50:48 -------------------------------------------------------------------------------- Wound Assessment Details Patient Name: Date of Service: [name] [name], [name] H. 03/16/2023 12:30 PM Medical Record Number: [id number] Patient Account Number: [id number] Date of Birth/Sex: Treating RN: 11-15-1936 (87 y.o. [name] Primary Care Nioma Mccubbins: [name] Other Clinician: Referring Annely Sliva: Treating Teshara Moree/Extender: [name], [name] in Treatment: 6 Wound Status Wound Number: 1 Primary Etiology: Abrasion Wound Location: Right, Anterior Lower Leg Wound Status: Open Wounding Event: Skin Tear/Laceration Comorbid Cataracts, Asthma, Congestive Heart Failure, History: Osteoarthritis Date Acquired: 01/10/2023 Weeks Of Treatment: 6 Clustered Wound: Yes Photos Wound Measurements Length: (cm) Width: (cm) Depth: (cm) Clustered Quantity: Area: (cm) Volume: (cm) 4.4 % Reduction in Area: 87.2% 1.1 % Reduction in Volume: 87.2% 0.1 Epithelialization: Small (1-33%) 1 Tunneling: No 3.801 Undermining: No 0.38 Wound Description [name], [name]  ([id number]) Classification: Full Thickness Without Exposed Support Structures Wound Margin: Distinct, outline attached Exudate Amount: Medium Exudate Type: Serosanguineous Exudate Color: red, brown [id number] Page 6 of 7 Foul Odor After Cleansing: No Slough/Fibrino Yes Wound Bed Granulation Amount: Large (67-100%) Exposed Structure Granulation Quality: Red Fascia Exposed: No Necrotic Amount: None Present (0%) Fat Layer (Subcutaneous Tissue) Exposed: Yes Tendon Exposed: No Muscle Exposed: No Joint Exposed: No Bone Exposed: No Periwound Skin Texture Texture Color No Abnormalities Noted: No No Abnormalities Noted: No Callus: No Atrophie Blanche: No Crepitus: No Cyanosis: No Excoriation: No Ecchymosis: No Induration: No Erythema: No Rash: No Hemosiderin Staining: No Scarring: Yes Mottled: No Pallor: No Moisture Rubor: No No Abnormalities Noted: No Dry / Scaly: No Temperature / Pain Maceration: No Temperature: No Abnormality Tenderness on Palpation: Yes Treatment Notes Wound #1 (Lower Leg) Wound Laterality: Right, Anterior Cleanser Soap and Water Discharge Instruction: May shower and wash wound with dial antibacterial soap and water prior to dressing change. Peri-Wound Care Skin Prep Discharge Instruction: Use skin prep as directed Topical Primary Dressing Xeroform Occlusive Gauze Dressing, 4x4 in Discharge Instruction: Apply to wound bed as instructed Secondary Dressing ALLEVYN Gentle Border, 5x5 (in/in) Discharge Instruction: Apply  over primary dressing as directed. Secured With Compression Wrap Compression Stockings Facilities manager) Signed: 03/17/2023 1:56:54 PM By: Brenton Grills Entered By: Brenton Grills on 03/16/2023 12:46:55 -------------------------------------------------------------------------------- Vitals Details Patient Name: Date of Service: [name] [name], [name] H. 03/16/2023 12:30 PM Medical Record  Number: [id number] Patient Account Number: [id number] Date of Birth/Sex: Treating RN: 05/04/1936 (87 y.o. [name] Primary Care Deep Bonawitz: [name] Other Clinician: Referring Dagen Beevers: Treating Eder Macek/Extender: [name], [name] in Treatment: [address] ([id number]) [id number] Page 7 of 7 Vital Signs Time Taken: 12:36 Temperature (F): 98.1 Pulse (bpm): 97 Respiratory Rate (breaths/min): 18 Blood Pressure (mmHg): 129/78 Reference Range: 80 - 120 mg / dl Electronic Signature(s) Signed: 03/17/2023 1:56:54 PM By: Brenton Grills Entered By: Brenton Grills on 03/16/2023 12:37:31

## 2023-03-18 ENCOUNTER — Other Ambulatory Visit: Payer: Self-pay | Admitting: Internal Medicine

## 2023-03-18 NOTE — Telephone Encounter (Signed)
In my opinion, she should not get any more boosters for COVID-19. Thanks

## 2023-03-18 NOTE — Telephone Encounter (Signed)
Called and notified pt of MD response, pt verbalized understanding.

## 2023-03-23 ENCOUNTER — Ambulatory Visit: Payer: Medicare Other | Admitting: Internal Medicine

## 2023-03-28 ENCOUNTER — Ambulatory Visit: Payer: Medicare Other | Admitting: Internal Medicine

## 2023-03-29 ENCOUNTER — Ambulatory Visit (INDEPENDENT_AMBULATORY_CARE_PROVIDER_SITE_OTHER): Payer: Medicare Other | Admitting: Sports Medicine

## 2023-03-29 VITALS — BP 129/52 | Ht 60.0 in | Wt 116.0 lb

## 2023-03-29 DIAGNOSIS — M5136 Other intervertebral disc degeneration, lumbar region: Secondary | ICD-10-CM | POA: Diagnosis not present

## 2023-03-29 MED ORDER — PREDNISONE 20 MG PO TABS: 20.0000 mg | ORAL_TABLET | Freq: Two times a day (BID) | ORAL | 0 refills | Status: DC

## 2023-03-29 NOTE — Progress Notes (Signed)
Complaint: Low back pain radiating down her right leg to below the knee  Patient has known advanced degenerative lumbar spine disease For the first time in a long time she was doing well enough to travel to [place] by airplane for [employer] She did well on the trip up She was able to walk and go to most activities using her walker or sometimes just a cane The home exercises we have used have helped her considerably with less hip and low back pain  After flying home though her right hip became painful She has radiating pain down into her thigh as well as down the lateral leg to the knee and below that into the calf  Physical exam Elderly white female in no acute distress BP (!) 129/52   Ht 5' (1.524 m)   Wt 116 lb (52.6 kg)   BMI 22.65 kg/m   Patient is still pretty stable with her walking gait and does not have a Trendelenburg She is able to stand and walk unassisted better than before with started treatment Palpation does not reveal any tenderness over the right SI joint She does have some tenderness deep in the buttocks and that causes some sciatic symptoms She has some tenderness down the lateral leg with pressure as well Her strength is unchanged Her balance is better than her normal baseline

## 2023-03-29 NOTE — Assessment & Plan Note (Signed)
Patient was able to take a good trip but I believe the traveling by airplane probably triggered sciatic irritation down her right lower extremity She gets relief with tramadol and can sleep through the night With her home exercises she had been able to stop tramadol before 4 years she has taken 3 mg of Ativan at night to sleep Voltaren gel does help some of her local pain She continues on Tylenol  Because her radicular symptoms are much worse I suggested that we consider using prednisone 20 twice daily for 5 days However she has a dental procedure tomorrow and with a cardiac valve issue will need to take amoxicillin I suggested not starting the prednisone before Friday which is 3 days away Until that time use tramadol and if the tramadol relieves the pain sufficiently we will not use the prednisone at all She plans to see me in 1 to 2 weeks for follow-up of this

## 2023-03-30 ENCOUNTER — Encounter (HOSPITAL_BASED_OUTPATIENT_CLINIC_OR_DEPARTMENT_OTHER): Payer: Medicare Other | Attending: Physician Assistant | Admitting: Physician Assistant

## 2023-03-30 DIAGNOSIS — X58XXXA Exposure to other specified factors, initial encounter: Secondary | ICD-10-CM | POA: Insufficient documentation

## 2023-03-30 DIAGNOSIS — I87311 Chronic venous hypertension (idiopathic) with ulcer of right lower extremity: Secondary | ICD-10-CM | POA: Insufficient documentation

## 2023-03-30 DIAGNOSIS — I10 Essential (primary) hypertension: Secondary | ICD-10-CM | POA: Diagnosis not present

## 2023-03-30 DIAGNOSIS — S80811A Abrasion, right lower leg, initial encounter: Secondary | ICD-10-CM | POA: Insufficient documentation

## 2023-03-30 DIAGNOSIS — G8929 Other chronic pain: Secondary | ICD-10-CM | POA: Diagnosis not present

## 2023-03-30 DIAGNOSIS — L97811 Non-pressure chronic ulcer of other part of right lower leg limited to breakdown of skin: Secondary | ICD-10-CM | POA: Insufficient documentation

## 2023-03-30 DIAGNOSIS — S81811A Laceration without foreign body, right lower leg, initial encounter: Secondary | ICD-10-CM | POA: Diagnosis not present

## 2023-03-31 ENCOUNTER — Other Ambulatory Visit: Payer: Self-pay | Admitting: Internal Medicine

## 2023-03-31 NOTE — Progress Notes (Signed)
RONALEE, SCHEUNEMANN ([id number]) [id number] Page 1 of 7 Visit Report for 03/30/2023 Arrival Information Details Patient Name: Date of Service: [name], [name] 03/30/2023 11:00 A M Medical Record Number: [id number] Patient Account Number: [id number] Date of Birth/Sex: Treating RN: 12-17-1935 (87 y.o. [name], [name] Primary Care Davante Gerke: [name] Other Clinician: Referring Quashon Jesus: Treating Maudie Shingledecker/Extender: [name] in Treatment: 8 Visit Information History Since Last Visit Added or deleted any medications: No Patient Arrived: Dan Humphreys Any new allergies or adverse reactions: No Arrival Time: 11:24 Had a fall or experienced change in No Accompanied By: self activities of daily living that may affect Transfer Assistance: None risk of falls: Patient Identification Verified: Yes Signs or symptoms of abuse/neglect since last visito No Secondary Verification Process Completed: Yes Hospitalized since last visit: No Patient Requires Transmission-Based Precautions: No Implantable device outside of the clinic excluding No Patient Has Alerts: No cellular tissue based products placed in the center since last visit: Has Dressing in Place as Prescribed: Yes Pain Present Now: No Electronic Signature(s) Signed: 03/30/2023 4:44:49 PM By: Shawn Stall RN, BSN Entered By: Shawn Stall on 03/30/2023 11:24:20 -------------------------------------------------------------------------------- Clinic Level of Care Assessment Details Patient Name: Date of Service: [name], [name] 03/30/2023 11:00 A M Medical Record Number: [id number] Patient Account Number: [id number] Date of Birth/Sex: Treating RN: [birth year] (87 y.o. [name], [name] Primary Care Marjani Kobel: [name] Other Clinician: Referring Eilan Mcinerny: Treating Shannie Kontos/Extender: [name] in Treatment: 8 Clinic Level of Care Assessment  Items TOOL 4 Quantity Score X- 1 0 Use when only an EandM is performed on FOLLOW-UP visit ASSESSMENTS - Nursing Assessment / Reassessment X- 1 10 Reassessment of [name]-morbidities (includes updates in patient status) X- 1 5 Reassessment of Adherence to Treatment Plan ASSESSMENTS - Wound and Skin A ssessment / Reassessment X - Simple Wound Assessment / Reassessment - one wound 1 5 []  - 0 Complex Wound Assessment / Reassessment - multiple wounds []  - 0 Dermatologic / Skin Assessment (not related to wound area) ASSESSMENTS - Focused Assessment X- 1 5 Circumferential Edema Measurements - multi extremities []  - 0 Nutritional Assessment / Counseling / Intervention []  - 0 Lower Extremity Assessment (monofilament, tuning fork, pulses) []  - 0 Peripheral Arterial Disease Assessment (using hand held doppler) ASSESSMENTS - Ostomy and/or Continence Assessment and Care []  - 0 Incontinence Assessment and Management []  - 0 Ostomy Care Assessment and Management (repouching, etc.) PROCESS - Coordination of Care X - Simple Patient / Family Education for ongoing care 1 [address] [place], [place] ([id number]) [phone].pdf Page 2 of 7 []  - 0 Complex (extensive) Patient / Family Education for ongoing care X- 1 10 Staff obtains Chiropractor, Records, T Results / Process Orders est []  - 0 Staff telephones HHA, Nursing Homes / Clarify orders / etc []  - 0 Routine Transfer to another Facility (non-emergent condition) []  - 0 Routine Hospital Admission (non-emergent condition) []  - 0 New Admissions / Manufacturing engineer / Ordering NPWT Apligraf, etc. , []  - 0 Emergency Hospital Admission (emergent condition) X- 1 10 Simple Discharge Coordination []  - 0 Complex (extensive) Discharge Coordination PROCESS - Special Needs []  - 0 Pediatric / Minor Patient Management []  - 0 Isolation Patient Management []  - 0 Hearing / Language / Visual special needs []  - 0 Assessment of  Community assistance (transportation, D/C planning, etc.) []  - 0 Additional assistance / Altered mentation []  - 0 Support Surface(s) Assessment (bed, cushion, seat, etc.) INTERVENTIONS - Wound  Cleansing / Measurement X - Simple Wound Cleansing - one wound 1 5 []  - 0 Complex Wound Cleansing - multiple wounds X- 1 5 Wound Imaging (photographs - any number of wounds) []  - 0 Wound Tracing (instead of photographs) X- 1 5 Simple Wound Measurement - one wound []  - 0 Complex Wound Measurement - multiple wounds INTERVENTIONS - Wound Dressings []  - 0 Small Wound Dressing one or multiple wounds []  - 0 Medium Wound Dressing one or multiple wounds []  - 0 Large Wound Dressing one or multiple wounds []  - 0 Application of Medications - topical []  - 0 Application of Medications - injection INTERVENTIONS - Miscellaneous []  - 0 External ear exam []  - 0 Specimen Collection (cultures, biopsies, blood, body fluids, etc.) []  - 0 Specimen(s) / Culture(s) sent or taken to Lab for analysis []  - 0 Patient Transfer (multiple staff / Nurse, adult / Similar devices) []  - 0 Simple Staple / Suture removal (25 or less) []  - 0 Complex Staple / Suture removal (26 or more) []  - 0 Hypo / Hyperglycemic Management (close monitor of Blood Glucose) []  - 0 Ankle / Brachial Index (ABI) - do not check if billed separately X- 1 5 Vital Signs Has the patient been seen at the hospital within the last three years: Yes Total Score: 80 Level Of Care: New/Established - Level 3 Electronic Signature(s) Signed: 03/30/2023 4:44:49 PM By: Shawn Stall RN, BSN Entered By: Shawn Stall on 03/30/2023 11:37:18 [name] ([id number]) [id number] Page 3 of 7 -------------------------------------------------------------------------------- Encounter Discharge Information Details Patient Name: Date of Service: [name], [name] 03/30/2023 11:00 A M Medical Record Number: [id number] Patient Account  Number: [id number] Date of Birth/Sex: Treating RN: 19-Mar-1936 (87 y.o. [name] Primary Care Chassity Ludke: [name] Other Clinician: Referring Graviel Payeur: Treating [place] Silveria/Extender: [name], [name] in Treatment: 8 Encounter Discharge Information Items Discharge Condition: Stable Ambulatory Status: Walker Discharge Destination: Home Transportation: Private Auto Accompanied By: self Schedule Follow-up Appointment: Yes Clinical Summary of Care: Electronic Signature(s) Signed: 03/30/2023 4:44:49 PM By: Shawn Stall RN, BSN Entered By: Shawn Stall on 03/30/2023 11:37:41 -------------------------------------------------------------------------------- Lower Extremity Assessment Details Patient Name: Date of Service: [name], [name] 03/30/2023 11:00 A M Medical Record Number: [id number] Patient Account Number: [id number] Date of Birth/Sex: Treating RN: 10-15-1936 (87 y.o. [name] Primary Care Jovany Disano: [name] Other Clinician: Referring Carys Malina: Treating Rayn Shorb/Extender: [name], [name] in Treatment: 8 Edema Assessment Assessed: [Left: No] [Right: Yes] Edema: [Left: N] [Right: o] Calf Left: Right: Point of Measurement: 28 cm From Medial Instep 32 cm Ankle Left: Right: Point of Measurement: 10 cm From Medial Instep 19 cm Vascular Assessment Pulses: Dorsalis Pedis Palpable: [Right:Yes] Electronic Signature(s) Signed: 03/30/2023 4:44:49 PM By: Shawn Stall RN, BSN Entered By: Shawn Stall on 03/30/2023 11:27:03 -------------------------------------------------------------------------------- Multi-Disciplinary Care Plan Details Patient Name: Date of Service: [name], [name] 03/30/2023 11:00 A M Medical Record Number: [id number] Patient Account Number: [id number] Date of Birth/Sex: Treating RN: 1936/05/26 (87 y.o. [name] Primary Care Akyra Bouchie: [name] Other  Clinician: Referring Maleia Weems: Treating Jalei Shibley/Extender: [name], [name] in Treatment: [address] [place] ([id number]) [id number] Page 4 of 7 Active Inactive Wound/Skin Impairment Nursing Diagnoses: Impaired tissue integrity Knowledge deficit related to ulceration/compromised skin integrity Goals: Patient/caregiver will verbalize understanding of skin care regimen Date Initiated: 02/02/2023 Target Resolution Date: 04/15/2023 Goal Status: Active Ulcer/skin breakdown will have a volume reduction of 30%  by week 4 Date Initiated: 02/02/2023 Date Inactivated: 03/02/2023 Target Resolution Date: 03/02/2023 Goal Status: Met Interventions: Assess patient/caregiver ability to obtain necessary supplies Assess patient/caregiver ability to perform ulcer/skin care regimen upon admission and as needed Assess ulceration(s) every visit Provide education on ulcer and skin care Notes: Electronic Signature(s) Signed: 03/30/2023 4:44:49 PM By: Shawn Stall RN, BSN Entered By: Shawn Stall on 03/30/2023 11:28:10 -------------------------------------------------------------------------------- Pain Assessment Details Patient Name: Date of Service: [name], [name] 03/30/2023 11:00 A M Medical Record Number: [id number] Patient Account Number: [id number] Date of Birth/Sex: Treating RN: [birth year] (87 y.o. [name] Primary Care Darcelle Herrada: [name] Other Clinician: Referring Jaciel Diem: Treating Ravneet Spilker/Extender: [name] in Treatment: 8 Active Problems Location of Pain Severity and Description of Pain Patient Has Paino No Site Locations Pain Management and Medication Current Pain Management: Electronic Signature(s) Signed: 03/30/2023 4:44:49 PM By: Shawn Stall RN, BSN Entered By: Shawn Stall on 03/30/2023 11:24:46 [name] ([id number]) [id number] Page 5 of  7 -------------------------------------------------------------------------------- Patient/Caregiver Education Details Patient Name: Date of Service: [name], [name] 5/1/2024andnbsp11:00 A M Medical Record Number: [id number] Patient Account Number: [id number] Date of Birth/Gender: Treating RN: 1936/11/07 (87 y.o. [name] Primary Care Physician: [name] Other Clinician: Referring Physician: Treating Physician/Extender: [name], [name] in Treatment: 8 Education Assessment Education Provided To: Patient Education Topics Provided Wound/Skin Impairment: Handouts: Caring for Your Ulcer Methods: Explain/Verbal Responses: Reinforcements needed Electronic Signature(s) Signed: 03/30/2023 4:44:49 PM By: Shawn Stall RN, BSN Entered By: Shawn Stall on 03/30/2023 11:28:21 -------------------------------------------------------------------------------- Wound Assessment Details Patient Name: Date of Service: [name], [name] 03/30/2023 11:00 A M Medical Record Number: [id number] Patient Account Number: [id number] Date of Birth/Sex: Treating RN: July 12, 1936 (87 y.o. [name], [name] Primary Care Marzetta Lanza: [name] Other Clinician: Referring Izabella Marcantel: Treating Ladarious Kresse/Extender: [name], [name] in Treatment: 8 Wound Status Wound Number: 1 Primary Etiology: Abrasion Wound Location: Right, Anterior Lower Leg Wound Status: Open Wounding Event: Skin Tear/Laceration Comorbid Cataracts, Asthma, Congestive Heart Failure, History: Osteoarthritis Date Acquired: 01/10/2023 Weeks Of Treatment: 8 Clustered Wound: Yes Photos Wound Measurements Length: (cm) Width: (cm) Depth: (cm) Clustered Quantity: Area: (cm) Volume: (cm) 4.3 % Reduction in Area: 89.8% 0.9 % Reduction in Volume: 89.8% 0.1 Epithelialization: Medium (34-66%) 1 Tunneling: No 3.039 Undermining: No 0.304 Wound Description Classification: Full  Thickness Without Exposed Support Structures [name], [name] ([id number]) Wound Margin: Distinct, outline attached Exudate Amount: Medium Exudate Type: Serosanguineous Exudate Color: red, brown Foul Odor After Cleansing: No [phone].pdf Page 6 of 7 Slough/Fibrino Yes Wound Bed Granulation Amount: Large (67-100%) Exposed Structure Granulation Quality: Red Fascia Exposed: No Necrotic Amount: Small (1-33%) Fat Layer (Subcutaneous Tissue) Exposed: Yes Necrotic Quality: Adherent Slough Tendon Exposed: No Muscle Exposed: No Joint Exposed: No Bone Exposed: No Periwound Skin Texture Texture Color No Abnormalities Noted: No No Abnormalities Noted: No Callus: No Atrophie Blanche: No Crepitus: No Cyanosis: No Excoriation: No Ecchymosis: No Induration: No Erythema: No Rash: No Hemosiderin Staining: No Scarring: Yes Mottled: No Pallor: No Moisture Rubor: No No Abnormalities Noted: No Dry / Scaly: No Temperature / Pain Maceration: No Temperature: No Abnormality Tenderness on Palpation: Yes Treatment Notes Wound #1 (Lower Leg) Wound Laterality: Right, Anterior Cleanser Soap and Water Discharge Instruction: May shower and wash wound with dial antibacterial soap and water prior to dressing change. Peri-Wound Care Skin Prep Discharge Instruction: Use skin prep as directed Topical Primary Dressing FIBRACOL Plus Dressing,  2x2 in (collagen) Discharge Instruction: Moisten collagen with [place] Jelly or saline. Xeroform Occlusive Gauze Dressing, 4x4 in Discharge Instruction: Apply over the collagen as instructed Secondary Dressing ALLEVYN Gentle Border, 5x5 (in/in) Discharge Instruction: Apply over primary dressing as directed. Secured With Compression Wrap Compression Stockings Facilities manager) Signed: 03/30/2023 4:25:39 PM By: Fonnie Mu RN Signed: 03/30/2023 4:44:49 PM By: Shawn Stall RN, BSN Entered By: Fonnie Mu on  03/30/2023 11:29:41 -------------------------------------------------------------------------------- Vitals Details Patient Name: Date of Service: [name], [name] 03/30/2023 11:00 A M Medical Record Number: [id number] Patient Account Number: [id number] Date of Birth/Sex: Treating RN: [birth year] (87 y.o. [name] Primary Care Shanquita Ronning: [name] Other Clinician: ANICA, [name] ([id number]) [id number] Page 7 of 7 Referring Panayiota Larkin: Treating Koleman Marling/Extender: [name] Plotnikov, [name] in Treatment: 8 Vital Signs Time Taken: 11:25 Temperature (F): 97.9 Pulse (bpm): 79 Respiratory Rate (breaths/min): 20 Blood Pressure (mmHg): 119/69 Reference Range: 80 - 120 mg / dl Electronic Signature(s) Signed: 03/30/2023 4:44:49 PM By: Shawn Stall RN, BSN Entered By: Shawn Stall on 03/30/2023 11:25:59

## 2023-03-31 NOTE — Progress Notes (Addendum)
BRIAR, SWORD ([id number]) [id number] Page 1 of 5 Visit Report for 03/30/2023 Chief Complaint Document Details Patient Name: Date of Service: [name], [name] 03/30/2023 11:00 A M Medical Record Number: [id number] Patient Account Number: [id number] Date of Birth/Sex: Treating RN: 1936-09-14 (87 y.o. F) Primary Care Provider: [name] Other Clinician: Referring Provider: Treating Provider/Extender: [name], [name] in Treatment: 8 Information Obtained from: Patient Chief Complaint 02/03/2023; patient is here from the [facility] with an abrasion injury on her right lower leg Electronic Signature(s) Signed: 03/30/2023 11:12:22 AM By: [name] PA-C Entered By: [name] on 03/30/2023 11:12:22 -------------------------------------------------------------------------------- HPI Details Patient Name: Date of Service: [name] HEN, [name] 03/30/2023 11:00 A M Medical Record Number: [id number] Patient Account Number: [id number] Date of Birth/Sex: Treating RN: Mar 04, 1936 (87 y.o. F) Primary Care Provider: [name] Other Clinician: Referring Provider: Treating Provider/Extender: [name] in Treatment: 8 History of Present Illness HPI Description: ADMISSION 02/02/23 Mrs. Steedman is an 87 year old woman who lives in the [facility] the [facility]. She has been dealing with a leg wound since February 19 when she traumatized her right leg on a cleaning bucket. She has been using Bactroban and T and an Ace wrap. elfa Chronic low back pain, right hip pain, osteoporosis, hypertension, gait disease disorder she is not a diabetic. 02-16-2023 upon evaluation today patient appears to be doing excellent in regard to her wound although it does look a little bit dry overall I think we are still seeing some improvements here which is great news. Fortunately I  do not see any evidence of active infection locally nor systemically which is great news. No fevers, chills, nausea, vomiting, or diarrhea. 02-23-2023 upon evaluation today patient appears to be doing well currently in regard to her wounds. She actually is doing excellent and in fact the more medial portion of the wound is almost completely closed the lateral portion is much smaller I am very pleased with what we are seeing. 03-02-2023 upon evaluation today patient appears to be doing well currently in regard to her wound was actually showing signs of excellent improvement. Fortunately I do not see any evidence of active infection locally nor systemically which is great news and overall I am extremely pleased with where things stand. There is no sign of infection. No fevers, chills, nausea, vomiting, or diarrhea. 03-09-2023 upon evaluation today patient appears to be doing well currently in regard to her wound. This is actually showing signs of improvement I am actually very pleased with where we stand I think she is continuing to move in the right direction. She is very pleased and happy to hear this. 03-16-2023 upon evaluation today patient appears to be doing well with regard to her wounds visually and I do feel like she is making good progress here. Fortunately there does not appear to be any signs of active infection locally nor systemically which is great news. No fevers, chills, nausea, vomiting, or diarrhea. 03-30-2023 upon evaluation today patient appears to be doing well currently in regard to her wound. She is actually been tolerating the dressing changes without complication. Fortunately I do not see any signs of active infection locally nor systemically which is great news and I think that she is moving in the right direction. Electronic Signature(s) Signed: 03/30/2023 1:29:10 PM By: [name] PA-C Entered By: [name] on 03/30/2023 13:29:10 Physical Exam  Details -------------------------------------------------------------------------------- [name] ([id number])  [id number] Page 2 of 5 Patient Name: Date of Service: [name] HEN, [name] 03/30/2023 11:00 A M Medical Record Number: [id number] Patient Account Number: [id number] Date of Birth/Sex: Treating RN: 04-Mar-1936 (87 y.o. F) Primary Care Provider: [name] Other Clinician: Referring Provider: Treating Provider/Extender: [name], [name] in Treatment: 8 Constitutional Well-nourished and well-hydrated in no acute distress. Respiratory normal breathing without difficulty. Psychiatric this patient is able to make decisions and demonstrates good insight into disease process. Alert and Oriented x 3. pleasant and cooperative. Notes Upon inspection patient's wound bed actually showed signs of good granulation epithelization at this point. Fortunately I do not see any signs of active infection which is great news and in general I do believe that we are making good progress. Electronic Signature(s) Signed: 03/30/2023 1:29:27 PM By: [name] PA-C Entered By: [name] on 03/30/2023 13:29:27 -------------------------------------------------------------------------------- Physician Orders Details Patient Name: Date of Service: [name] HEN, [name] 03/30/2023 11:00 A M Medical Record Number: [id number] Patient Account Number: [id number] Date of Birth/Sex: Treating RN: [birth year] (87 y.o. [name], [name] Primary Care Provider: [name] Other Clinician: Referring Provider: Treating Provider/Extender: [name] in Treatment: 8 Verbal / Phone Orders: No Diagnosis Coding ICD-10 Coding Code Description L97.811 Non-pressure chronic ulcer of other part of right lower leg limited to breakdown of skin S80.811D Abrasion, right lower leg, subsequent encounter I87.311 Chronic venous hypertension  (idiopathic) with ulcer of right lower extremity Follow-up Appointments ppointment in 1 week. - Dr. Leonard Schwartz in Room 7 04/06/2023 1015 Wednesday Return A ppointment in 2 weeks. - Dr. Leonard Schwartz room 8 04/13/2023 1100 Wednesday Return A Other: - [facility]/ Shower/ Hygiene May shower and wash wound with soap and water. Edema Control - Lymphedema / SCD / Other Bilateral Lower Extremities Avoid standing for long periods of time. Patient to wear own compression stockings every day. - pt to order from [facility]. Exercise regularly Wound Treatment Wound #1 - Lower Leg Wound Laterality: Right, Anterior Cleanser: Soap and Water Every Other Day/30 Days Discharge Instructions: May shower and wash wound with dial antibacterial soap and water prior to dressing change. Peri-Wound Care: Skin Prep (Generic) Every Other Day/30 Days Discharge Instructions: Use skin prep as directed Prim Dressing: FIBRACOL Plus Dressing, 2x2 in (collagen) Every Other Day/30 Days ary Discharge Instructions: Moisten collagen with [place] Jelly or saline. Prim Dressing: Xeroform Occlusive Gauze Dressing, 4x4 in (Generic) Every Other Day/30 Days ary Discharge Instructions: Apply over the collagen as instructed Secondary Dressing: ALLEVYN Gentle Border, 5x5 (in/in) (Generic) Every Other Day/30 Days DANISA, KOPEC ([id number]) [id number] Page 3 of 5 Discharge Instructions: Apply over primary dressing as directed. Electronic Signature(s) Signed: 03/30/2023 4:44:49 PM By: Shawn Stall RN, BSN Signed: 03/30/2023 5:01:35 PM By: [name] PA-C Entered By: Shawn Stall on 03/30/2023 11:36:50 -------------------------------------------------------------------------------- Problem List Details Patient Name: Date of Service: [name] HEN, Kennady H. 03/30/2023 11:00 A M Medical Record Number: [id number] Patient Account Number: [id number] Date of Birth/Sex: Treating RN: [birth year] (87 y.o. F) Primary Care  Provider: [name] Other Clinician: Referring Provider: Treating Provider/Extender: [name] in Treatment: 8 Active Problems ICD-10 Encounter Code Description Active Date MDM Diagnosis L97.811 Non-pressure chronic ulcer of other part of right lower leg limited to breakdown 02/02/2023 No Yes of skin S80.811D Abrasion, right lower leg, subsequent encounter 02/02/2023 No Yes I87.311 Chronic venous hypertension (idiopathic) with ulcer of right lower extremity 02/02/2023 No Yes Inactive Problems  Resolved Problems Electronic Signature(s) Signed: 03/30/2023 11:12:12 AM By: [name] PA-C Entered By: [name] on 03/30/2023 11:12:12 -------------------------------------------------------------------------------- Progress Note Details Patient Name: Date of Service: [name] HEN, Maleiah H. 03/30/2023 11:00 A M Medical Record Number: [id number] Patient Account Number: [id number] Date of Birth/Sex: Treating RN: 22-Mar-1936 (87 y.o. F) Primary Care Provider: [name] Other Clinician: Referring Provider: Treating Provider/Extender: [name], [name] in Treatment: 8 Subjective Chief Complaint Information obtained from Patient 02/03/2023; patient is here from the [facility] with an abrasion injury on her right lower leg History of Present Illness (HPI) ADMISSION 02/02/23 Mrs. Sanjuan is an 87 year old woman who lives in the independent [facility] the [facility]. She has been dealing with a leg wound since February 19 when she traumatized her right leg on a cleaning bucket. She has been using Bactroban and T and an Ace wrap. elfa Chronic low back pain, right hip pain, osteoporosis, hypertension, gait disease disorder she is not a diabetic. JAZIRA, MALONEY ([id number]) [id number] Page 4 of 5 02-16-2023 upon evaluation today patient appears to be doing excellent in  regard to her wound although it does look a little bit dry overall I think we are still seeing some improvements here which is great news. Fortunately I do not see any evidence of active infection locally nor systemically which is great news. No fevers, chills, nausea, vomiting, or diarrhea. 02-23-2023 upon evaluation today patient appears to be doing well currently in regard to her wounds. She actually is doing excellent and in fact the more medial portion of the wound is almost completely closed the lateral portion is much smaller I am very pleased with what we are seeing. 03-02-2023 upon evaluation today patient appears to be doing well currently in regard to her wound was actually showing signs of excellent improvement. Fortunately I do not see any evidence of active infection locally nor systemically which is great news and overall I am extremely pleased with where things stand. There is no sign of infection. No fevers, chills, nausea, vomiting, or diarrhea. 03-09-2023 upon evaluation today patient appears to be doing well currently in regard to her wound. This is actually showing signs of improvement I am actually very pleased with where we stand I think she is continuing to move in the right direction. She is very pleased and happy to hear this. 03-16-2023 upon evaluation today patient appears to be doing well with regard to her wounds visually and I do feel like she is making good progress here. Fortunately there does not appear to be any signs of active infection locally nor systemically which is great news. No fevers, chills, nausea, vomiting, or diarrhea. 03-30-2023 upon evaluation today patient appears to be doing well currently in regard to her wound. She is actually been tolerating the dressing changes without complication. Fortunately I do not see any signs of active infection locally nor systemically which is great news and I think that she is moving in the  right direction. Objective Constitutional Well-nourished and well-hydrated in no acute distress. Vitals Time Taken: 11:25 AM, Temperature: 97.9 F, Pulse: 79 bpm, Respiratory Rate: 20 breaths/min, Blood Pressure: 119/69 mmHg. Respiratory normal breathing without difficulty. Psychiatric this patient is able to make decisions and demonstrates good insight into disease process. Alert and Oriented x 3. pleasant and cooperative. General Notes: Upon inspection patient's wound bed actually showed signs of good granulation epithelization at this point. Fortunately I do not see any signs of active  infection which is great news and in general I do believe that we are making good progress. Integumentary (Hair, Skin) Wound #1 status is Open. Original cause of wound was Skin T ear/Laceration. The date acquired was: 01/10/2023. The wound has been in treatment 8 weeks. The wound is located on the Right,Anterior Lower Leg. The wound measures 4.3cm length x 0.9cm width x 0.1cm depth; 3.039cm^2 area and 0.304cm^3 volume. There is Fat Layer (Subcutaneous Tissue) exposed. There is no tunneling or undermining noted. There is a medium amount of serosanguineous drainage noted. The wound margin is distinct with the outline attached to the wound base. There is large (67-100%) red granulation within the wound bed. There is a small (1-33%) amount of necrotic tissue within the wound bed including Adherent Slough. The periwound skin appearance exhibited: Scarring. The periwound skin appearance did not exhibit: Callus, Crepitus, Excoriation, Induration, Rash, Dry/Scaly, Maceration, Atrophie Blanche, Cyanosis, Ecchymosis, Hemosiderin Staining, Mottled, Pallor, Rubor, Erythema. Periwound temperature was noted as No Abnormality. The periwound has tenderness on palpation. Assessment Active Problems ICD-10 Non-pressure chronic ulcer of other part of right lower leg limited to breakdown of skin Abrasion, right lower leg,  subsequent encounter Chronic venous hypertension (idiopathic) with ulcer of right lower extremity Plan Follow-up Appointments: Return Appointment in 1 week. - Dr. Leonard Schwartz in Room 7 04/06/2023 1015 Wednesday Return Appointment in 2 weeks. - Dr. Leonard Schwartz room 8 04/13/2023 1100 Wednesday Other: - [facility]/ [facility]ower/ Hygiene: May shower and wash wound with soap and water. Edema Control - Lymphedema / SCD / Other: Avoid standing for long periods of time. Patient to wear own compression stockings every day. - pt to order from [facility]. Exercise regularly WOUND #1: - Lower Leg Wound Laterality: Right, Anterior AASHA, DINA ([id number]) [id number] Page 5 of 5 Cleanser: Soap and Water Every Other Day/30 Days Discharge Instructions: May shower and wash wound with dial antibacterial soap and water prior to dressing change. Peri-Wound Care: Skin Prep (Generic) Every Other Day/30 Days Discharge Instructions: Use skin prep as directed Prim Dressing: FIBRACOL Plus Dressing, 2x2 in (collagen) Every Other Day/30 Days ary Discharge Instructions: Moisten collagen with [place] Jelly or saline. Prim Dressing: Xeroform Occlusive Gauze Dressing, 4x4 in (Generic) Every Other Day/30 Days ary Discharge Instructions: Apply over the collagen as instructed Secondary Dressing: ALLEVYN Gentle Border, 5x5 (in/in) (Generic) Every Other Day/30 Days Discharge Instructions: Apply over primary dressing as directed. 1. I would recommend based on what we are seeing we continue with the Xeroform that will give you some collagen underneath I am hopeful this will help get the area to heal much more effectively and quickly. 2. I am also can recommend the patient should continue to monitor for any signs of infection or worsening. Obviously based on what I am seeing I do believe that we are moving in the right direction. We will see patient back for reevaluation in 1 week here in the clinic. If  anything worsens or changes patient will contact our office for additional recommendations. Electronic Signature(s) Signed: 03/30/2023 1:30:25 PM By: [name] PA-C Entered By: [name] on 03/30/2023 13:30:24 -------------------------------------------------------------------------------- SuperBill Details Patient Name: Date of Service: [name] HEN, Corena H. 03/30/2023 Medical Record Number: [id number] Patient Account Number: [id number] Date of Birth/Sex: Treating RN: Dec 23, 1935 (87 y.o. [name] Primary Care Provider: [name] Other Clinician: Referring Provider: Treating Provider/Extender: [name] in Treatment: 8 Diagnosis Coding ICD-10 Codes Code Description [phone] Non-pressure chronic ulcer of other part of  right lower leg limited to breakdown of skin S80.811D Abrasion, right lower leg, subsequent encounter I87.311 Chronic venous hypertension (idiopathic) with ulcer of right lower extremity Facility Procedures : CPT4 Code: 16109604 Description: 99213 - WOUND CARE VISIT-LEV 3 EST PT Modifier: Quantity: 1 Physician Procedures : CPT4 Code Description Modifier [id number] - WC PHYS LEVEL 3 - EST PT ICD-10 Diagnosis Description L97.811 Non-pressure chronic ulcer of other part of right lower leg limited to breakdown of skin S80.811D Abrasion, right lower leg, subsequent  encounter I87.311 Chronic venous hypertension (idiopathic) with ulcer of right lower extremity Quantity: 1 Electronic Signature(s) Signed: 03/30/2023 1:30:41 PM By: [name] PA-C Entered By: [name] on 03/30/2023 13:30:41

## 2023-04-01 ENCOUNTER — Other Ambulatory Visit (INDEPENDENT_AMBULATORY_CARE_PROVIDER_SITE_OTHER): Payer: Medicare Other

## 2023-04-01 ENCOUNTER — Ambulatory Visit: Payer: Medicare Other

## 2023-04-01 DIAGNOSIS — G8929 Other chronic pain: Secondary | ICD-10-CM

## 2023-04-01 DIAGNOSIS — M545 Low back pain, unspecified: Secondary | ICD-10-CM | POA: Diagnosis not present

## 2023-04-01 DIAGNOSIS — N3281 Overactive bladder: Secondary | ICD-10-CM

## 2023-04-01 DIAGNOSIS — E785 Hyperlipidemia, unspecified: Secondary | ICD-10-CM

## 2023-04-01 LAB — LIPID PANEL
Cholesterol: 164 mg/dL (ref 0–200)
HDL: 51.6 mg/dL (ref >39.00)
LDL Cholesterol: 90 mg/dL (ref 0–99)
NonHDL: 112.6
Total CHOL/HDL Ratio: 3
Triglycerides: 114 mg/dL (ref 0.0–149.0)
VLDL: 22.8 mg/dL (ref 0.0–40.0)

## 2023-04-01 LAB — COMPREHENSIVE METABOLIC PANEL
ALT: 17 U/L (ref 0–35)
AST: 20 U/L (ref 0–37)
Albumin: 4.1 g/dL (ref 3.5–5.2)
Alkaline Phosphatase: 33 U/L — ABNORMAL LOW (ref 39–117)
BUN: 16 mg/dL (ref 6–23)
CO2: 28 mEq/L (ref 19–32)
Calcium: 9 mg/dL (ref 8.4–10.5)
Chloride: 96 mEq/L (ref 96–112)
Creatinine, Ser: 0.62 mg/dL (ref 0.40–1.20)
GFR: 81.59 mL/min (ref >60.00)
Glucose, Bld: 90 mg/dL (ref 70–99)
Potassium: 4.2 mEq/L (ref 3.5–5.1)
Sodium: 132 mEq/L — ABNORMAL LOW (ref 135–145)
Total Bilirubin: 0.6 mg/dL (ref 0.2–1.2)
Total Protein: 7.2 g/dL (ref 6.0–8.3)

## 2023-04-04 ENCOUNTER — Other Ambulatory Visit: Payer: Self-pay | Admitting: Internal Medicine

## 2023-04-04 ENCOUNTER — Telehealth: Payer: Self-pay | Admitting: *Deleted

## 2023-04-04 NOTE — Telephone Encounter (Signed)
Pt need PA on Lorazepam 1 mg. Submitted w/  (Key: [id number]). Waiting on insurance response.Marland KitchenRaechel Chute

## 2023-04-05 ENCOUNTER — Ambulatory Visit: Payer: Medicare Other | Admitting: Sports Medicine

## 2023-04-05 DIAGNOSIS — H10413 Chronic giant papillary conjunctivitis, bilateral: Secondary | ICD-10-CM | POA: Diagnosis not present

## 2023-04-05 NOTE — Telephone Encounter (Signed)
Rec'd determination med was Approved. Reference Number: [id number]. LORAZEPAM TAB 1MG  is approved through 11/29/2023. Faxed approval to pof.Marland KitchenRaechel Chute

## 2023-04-06 ENCOUNTER — Ambulatory Visit: Payer: Medicare Other | Admitting: Internal Medicine

## 2023-04-06 ENCOUNTER — Encounter (HOSPITAL_BASED_OUTPATIENT_CLINIC_OR_DEPARTMENT_OTHER): Payer: Medicare Other | Admitting: Physician Assistant

## 2023-04-06 ENCOUNTER — Encounter: Payer: Self-pay | Admitting: Internal Medicine

## 2023-04-06 ENCOUNTER — Ambulatory Visit (INDEPENDENT_AMBULATORY_CARE_PROVIDER_SITE_OTHER): Payer: Medicare Other | Admitting: Internal Medicine

## 2023-04-06 VITALS — BP 134/62 | HR 85 | Temp 98.2°F | Ht 60.0 in | Wt 112.0 lb

## 2023-04-06 DIAGNOSIS — R3915 Urgency of urination: Secondary | ICD-10-CM

## 2023-04-06 DIAGNOSIS — K5901 Slow transit constipation: Secondary | ICD-10-CM | POA: Diagnosis not present

## 2023-04-06 DIAGNOSIS — S80811A Abrasion, right lower leg, initial encounter: Secondary | ICD-10-CM | POA: Diagnosis not present

## 2023-04-06 DIAGNOSIS — G8929 Other chronic pain: Secondary | ICD-10-CM | POA: Diagnosis not present

## 2023-04-06 DIAGNOSIS — T148XXA Other injury of unspecified body region, initial encounter: Secondary | ICD-10-CM | POA: Insufficient documentation

## 2023-04-06 DIAGNOSIS — I10 Essential (primary) hypertension: Secondary | ICD-10-CM | POA: Diagnosis not present

## 2023-04-06 DIAGNOSIS — I87311 Chronic venous hypertension (idiopathic) with ulcer of right lower extremity: Secondary | ICD-10-CM | POA: Diagnosis not present

## 2023-04-06 DIAGNOSIS — R3989 Other symptoms and signs involving the genitourinary system: Secondary | ICD-10-CM

## 2023-04-06 DIAGNOSIS — F4321 Adjustment disorder with depressed mood: Secondary | ICD-10-CM

## 2023-04-06 DIAGNOSIS — L97811 Non-pressure chronic ulcer of other part of right lower leg limited to breakdown of skin: Secondary | ICD-10-CM | POA: Diagnosis not present

## 2023-04-06 MED ORDER — LORATADINE 10 MG PO TABS: 10.0000 mg | ORAL_TABLET | Freq: Every day | ORAL | 3 refills | Status: DC

## 2023-04-06 MED ORDER — METAMUCIL SMOOTH TEXTURE 58.6 % PO POWD: ORAL | 6 refills | Status: DC

## 2023-04-06 NOTE — Assessment & Plan Note (Signed)
UA was ok On Premarin cream PV per GYN On [facility] F/u w/Dr Monico Blitz

## 2023-04-06 NOTE — Progress Notes (Signed)
Subjective:  Patient ID: [name], female    DOB: [birth year]  Age: 87 y.o. MRN: [id number]  CC: No chief complaint on file.   HPI CHELCY LILLA presents for [name] - better R LE wound has healed F/u HTN, dyslipidemia C/o allergies  Outpatient Medications Prior to Visit  Medication Sig Dispense Refill   acetaminophen (TYLENOL) 500 MG tablet Take 1,000 mg by mouth every 6 (six) hours as needed for mild pain.     albuterol (VENTOLIN HFA) 108 (90 Base) MCG/ACT inhaler Take 2 puffs every 4-6 hours as needed     amLODipine (NORVASC) 2.5 MG tablet Take 1 tablet (2.5 mg total) by mouth daily. 30 tablet 11   Azelastine HCl 0.15 % SOLN Place into both nostrils. Place into both nostrils.     Calcium Carbonate-Vitamin D (CALCIUM CARBONATE W/VITAMIN D PO) Take 1 tablet by mouth daily.     desonide (DESOWEN) 0.05 % cream Apply 1 application topically as needed.     diclofenac Sodium (VOLTAREN) 1 % GEL APPLY 4 GRAMS 4 TIMES DAILY. 200 g 3   EPINEPHrine (EPIPEN 2-PAK) 0.3 mg/0.3 mL IJ SOAJ injection Inject 0.3 mLs (0.3 mg total) into the muscle as needed. 1 Device 1   famotidine (PEPCID) 40 MG tablet Take 1 tablet (40 mg total) by mouth at bedtime. NEEDS OFFICE VISIT FOR ADDITIONAL REFILLS 30 tablet 2   ipratropium (ATROVENT) 0.06 % nasal spray Place 1 spray into the nose 3 (three) times daily as needed for rhinitis. 15 mL 0   levocetirizine (XYZAL) 5 MG tablet Take 5 mg by mouth every evening.     levothyroxine (SYNTHROID) 50 MCG tablet TAKE ONE TABLET BY MOUTH DAILY BEFORE BREAKFAST 30 tablet 5   LORazepam (ATIVAN) 1 MG tablet Take 2-3 tablets at bedtime and 1-2 tablets in the daytime as needed. 150 tablet 2   memantine (NAMENDA) 5 MG tablet Take 5 mg by mouth 2 (two) times daily.     montelukast (SINGULAIR) 10 MG tablet Take 1 tablet (10 mg total) by mouth at bedtime. 90 tablet 1   oxymetazoline (AFRIN 12 HOUR) 0.05 % nasal spray Place 1 spray into both nostrils daily at 2 am.     polyethylene  glycol powder (GLYCOLAX/MIRALAX) 17 GM/SCOOP powder Take 17 g by mouth 2 (two) times daily as needed for moderate constipation. 500 g 3   predniSONE (DELTASONE) 20 MG tablet Take 1 tablet (20 mg total) by mouth 2 (two) times daily. 10 tablet 0   simvastatin (ZOCOR) 40 MG tablet TAKE ONE TABLET ONCE DAILY 90 tablet 3   solifenacin (VESICARE) 10 MG tablet Take 1 tablet (10 mg total) by mouth daily as needed (bladder). 90 tablet 3   traMADol (ULTRAM) 50 MG tablet Take 1 tablet (50 mg total) by mouth every 6 (six) hours as needed for severe pain. 120 tablet 2   triamcinolone (NASACORT) 55 MCG/ACT AERO nasal inhaler Place 2 sprays into the nose as needed.     Gauze Pads & Dressings (TELFA ADHESIVE DRESSING) 3"X4" PADS Use qd (Patient not taking: Reported on 04/06/2023) 20 each 0   No facility-administered medications prior to visit.    ROS: Review of Systems  Constitutional:  Negative for activity change, appetite change, chills, fatigue and unexpected weight change.  HENT:  Positive for congestion and postnasal drip. Negative for mouth sores and sinus pressure.   Eyes:  Negative for visual disturbance.  Respiratory:  Negative for cough and chest tightness.  Gastrointestinal:  Positive for constipation. Negative for abdominal pain and nausea.  Genitourinary:  Positive for frequency and urgency. Negative for difficulty urinating, dysuria and vaginal pain.  Musculoskeletal:  Negative for back pain and gait problem.  Skin:  Negative for pallor and rash.  Neurological:  Negative for dizziness, tremors, weakness, numbness and headaches.  Hematological:  Bruises/bleeds easily.  Psychiatric/Behavioral:  Positive for decreased concentration. Negative for confusion and sleep disturbance.     Objective:  BP 134/62 (BP Location: Right Arm, Patient Position: Sitting, Cuff Size: Normal)   Pulse 85   Temp 98.2 F (36.8 C) (Oral)   Ht 5' (1.524 m)   Wt 112 lb (50.8 kg)   SpO2 92%   BMI 21.87 kg/m    BP Readings from Last 3 Encounters:  04/07/23 (!) 143/62  04/06/23 134/62  03/29/23 (!) 129/52    Wt Readings from Last 3 Encounters:  04/06/23 112 lb (50.8 kg)  03/29/23 116 lb (52.6 kg)  03/17/23 115 lb (52.2 kg)    Physical Exam Constitutional:      General: She is not in acute distress.    Appearance: Normal appearance. She is well-developed.  HENT:     Head: Normocephalic.     Right Ear: External ear normal.     Left Ear: External ear normal.     Nose: Nose normal.  Eyes:     General:        Right eye: No discharge.        Left eye: No discharge.     Conjunctiva/sclera: Conjunctivae normal.     Pupils: Pupils are equal, round, and reactive to light.  Neck:     Thyroid: No thyromegaly.     Vascular: No JVD.     Trachea: No tracheal deviation.  Cardiovascular:     Rate and Rhythm: Normal rate and regular rhythm.     Heart sounds: Normal heart sounds.  Pulmonary:     Effort: No respiratory distress.     Breath sounds: No stridor. No wheezing.  Abdominal:     General: Bowel sounds are normal. There is no distension.     Palpations: Abdomen is soft. There is no mass.     Tenderness: There is no abdominal tenderness. There is no guarding or rebound.  Musculoskeletal:        General: No tenderness.     Cervical back: Normal range of motion and neck supple. No rigidity.     Right lower leg: No edema.     Left lower leg: No edema.  Lymphadenopathy:     Cervical: No cervical adenopathy.  Skin:    Findings: Bruising present. No erythema or rash.  Neurological:     Mental Status: Mental status is at baseline.     Cranial Nerves: No cranial nerve deficit.     Motor: No abnormal muscle tone.     Coordination: Coordination normal.     Deep Tendon Reflexes: Reflexes normal.  Psychiatric:        Behavior: Behavior normal.        Thought Content: Thought content normal.        Judgment: Judgment normal.   Lumbar spine with slight pain on range of motion Cutaneous  ecchymoses-unchanged  Lab Results  Component Value Date   WBC 6.0 03/16/2023   HGB 13.4 03/16/2023   HCT 39.1 03/16/2023   PLT 342.0 03/16/2023   GLUCOSE 90 04/01/2023   CHOL 164 04/01/2023   TRIG 114.0 04/01/2023   HDL  51.60 04/01/2023   LDLDIRECT 98.0 08/13/2021   LDLCALC 90 04/01/2023   ALT 17 04/01/2023   AST 20 04/01/2023   NA 132 (L) 04/01/2023   K 4.2 04/01/2023   CL 96 04/01/2023   CREATININE 0.62 04/01/2023   BUN 16 04/01/2023   CO2 28 04/01/2023   TSH 1.29 03/16/2023   HGBA1C 5.5 06/13/2020    ECHOCARDIOGRAM COMPLETE  Result Date: 02/11/2023    ECHOCARDIOGRAM REPORT   Patient Name:   CRYSTIN FREDELL Date of Exam: 02/11/2023 Medical Rec #:  [id number]     Height:       60.5 in Accession #:    [id number]    Weight:       116.0 lb Date of Birth:  08/09/1936      BSA:          1.490 m Patient Age:    86 years      BP:           133/76 mmHg Patient Gender: F             HR:           73 bpm. Exam Location:  Outpatient Procedure: 2D Echo, Color Doppler and Cardiac Doppler Indications:    I50.9* Heart failure (unspecified)  History:        Patient has prior history of Echocardiogram examinations, most                 recent 06/25/2022. Risk Factors:Dyslipidemia.  Sonographer:    Irving Burton Senior RDCS Referring Phys: [id number] DANIEL R BENSIMHON IMPRESSIONS  1. Left ventricular ejection fraction, by estimation, is 55 to 60%. The left ventricle has normal function. The left ventricle has no regional wall motion abnormalities. Left ventricular diastolic parameters are consistent with Grade I diastolic dysfunction (impaired relaxation).  2. Right ventricular systolic function is normal. The right ventricular size is normal. There is normal pulmonary artery systolic pressure.  3. The mitral valve is normal in structure. Mild mitral valve regurgitation. No evidence of mitral stenosis.  4. The aortic valve is tricuspid. There is mild calcification of the aortic valve. Aortic valve regurgitation is mild.  Aortic valve sclerosis/calcification is present, without any evidence of aortic stenosis. Aortic regurgitation PHT measures 436 msec. Aortic valve area, by VTI measures 1.81 cm. Aortic valve mean gradient measures 5.0 mmHg. Aortic valve Vmax measures 1.45 m/s.  5. The inferior vena cava is normal in size with greater than 50% respiratory variability, suggesting right atrial pressure of 3 mmHg. FINDINGS  Left Ventricle: Left ventricular ejection fraction, by estimation, is 55 to 60%. The left ventricle has normal function. The left ventricle has no regional wall motion abnormalities. The left ventricular internal cavity size was normal in size. There is  no left ventricular hypertrophy. Left ventricular diastolic parameters are consistent with Grade I diastolic dysfunction (impaired relaxation). Right Ventricle: The right ventricular size is normal. No increase in right ventricular wall thickness. Right ventricular systolic function is normal. There is normal pulmonary artery systolic pressure. The tricuspid regurgitant velocity is 2.35 m/s, and  with an assumed right atrial pressure of 3 mmHg, the estimated right ventricular systolic pressure is 25.1 mmHg. Left Atrium: Left atrial size was normal in size. Right Atrium: Right atrial size was normal in size. Pericardium: There is no evidence of pericardial effusion. Mitral Valve: The mitral valve is normal in structure. Mild mitral valve regurgitation. No evidence of mitral valve stenosis. Tricuspid Valve: The tricuspid valve is normal  in structure. Tricuspid valve regurgitation is not demonstrated. No evidence of tricuspid stenosis. Aortic Valve: The aortic valve is tricuspid. There is mild calcification of the aortic valve. Aortic valve regurgitation is mild. Aortic regurgitation PHT measures 436 msec. Aortic valve sclerosis/calcification is present, without any evidence of aortic stenosis. Aortic valve mean gradient measures 5.0 mmHg. Aortic valve peak gradient  measures 8.4 mmHg. Aortic valve area, by VTI measures 1.81 cm. Pulmonic Valve: The pulmonic valve was normal in structure. Pulmonic valve regurgitation is trivial. No evidence of pulmonic stenosis. Aorta: The aortic root is normal in size and structure. Venous: The inferior vena cava is normal in size with greater than 50% respiratory variability, suggesting right atrial pressure of 3 mmHg. IAS/Shunts: No atrial level shunt detected by color flow Doppler.  LEFT VENTRICLE PLAX 2D LVIDd:         4.00 cm   Diastology LVIDs:         2.90 cm   LV e' medial:    5.44 cm/s LV PW:         0.80 cm   LV E/e' medial:  8.4 LV IVS:        0.70 cm   LV e' lateral:   4.90 cm/s LVOT diam:     1.95 cm   LV E/e' lateral: 9.3 LV SV:         51 LV SV Index:   34 LVOT Area:     2.99 cm  RIGHT VENTRICLE RV S prime:     13.50 cm/s TAPSE (M-mode): 1.6 cm LEFT ATRIUM             Index        RIGHT ATRIUM           Index LA diam:        2.60 cm 1.74 cm/m   RA Area:     13.40 cm LA Vol (A2C):   43.7 ml 29.33 ml/m  RA Volume:   30.70 ml  20.60 ml/m LA Vol (A4C):   25.6 ml 17.18 ml/m LA Biplane Vol: 33.1 ml 22.21 ml/m  AORTIC VALVE AV Area (Vmax):    1.78 cm AV Area (Vmean):   1.81 cm AV Area (VTI):     1.81 cm AV Vmax:           145.00 cm/s AV Vmean:          101.000 cm/s AV VTI:            0.282 m AV Peak Grad:      8.4 mmHg AV Mean Grad:      5.0 mmHg LVOT Vmax:         86.60 cm/s LVOT Vmean:        61.200 cm/s LVOT VTI:          0.171 m LVOT/AV VTI ratio: 0.61 AI PHT:            436 msec  AORTA Ao Root diam: 2.90 cm Ao Asc diam:  2.90 cm MITRAL VALVE                  TRICUSPID VALVE MV Area (PHT): 3.48 cm       TR Peak grad:   22.1 mmHg MV Decel Time: 218 msec       TR Vmax:        235.00 cm/s MR Peak grad:    114.5 mmHg MR Mean grad:    64.0 mmHg    SHUNTS MR  Vmax:         535.00 cm/s  Systemic VTI:  0.17 m MR Vmean:        360.0 cm/s   Systemic Diam: 1.95 cm MR PISA:         1.01 cm MR PISA Eff ROA: 7 mm MR PISA Radius:  0.40  cm MV E velocity: 45.50 cm/s MV A velocity: 85.10 cm/s MV E/A ratio:  0.53 Arvilla Meres MD Electronically signed by Arvilla Meres MD Signature Date/Time: 02/11/2023/2:06:19 PM    Final     Assessment & Plan:   Problem List Items Addressed This Visit     Constipation - Primary    Take Metamucil daily Senokot as to produce soft regular stools.       Relevant Orders   Comprehensive metabolic panel   CBC with Differential/Platelet   Bladder pain    UA was ok On Premarin cream PV per GYN On Vesicare F/u w/Dr Monico Blitz      Urinary urgency    UA was ok On Premarin cream PV per GYN On Vesicare F/u w/Dr Monico Blitz      Relevant Orders   CBC with Differential/Platelet   Grief    Coping OK      Bruising    Use Arnica gel prn      Relevant Orders   Comprehensive metabolic panel   CBC with Differential/Platelet      Meds ordered this encounter  Medications   loratadine (CLARITIN) 10 MG tablet    Sig: Take 1 tablet (10 mg total) by mouth daily.    Dispense:  100 tablet    Refill:  3   psyllium (METAMUCIL SMOOTH TEXTURE) 58.6 % powder    Sig: 1 scoop daily    Dispense:  283 g    Refill:  6      Follow-up: Return in about 3 months (around 07/07/2023) for a follow-up visit.  Sonda Primes, MD

## 2023-04-06 NOTE — Progress Notes (Signed)
[name], [name] ([id number]) [id number] Page 1 of 6 Visit Report for 04/06/2023 Chief Complaint Document Details Patient Name: Date of Service: [name] [name], [name] 04/06/2023 9:30 A M Medical Record Number: [id number] Patient Account Number: [id number] Date of Birth/Sex: Treating RN: [birth year] (87 y.o. F) Primary Care Provider: [name] Other Clinician: Referring Provider: Treating Provider/Extender: [name], [name] in Treatment: 9 Information Obtained from: Patient Chief Complaint 02/03/2023; patient is here from the [facility] with an abrasion injury on her right lower leg Electronic Signature(s) Signed: 04/06/2023 9:12:14 AM By: [name] PA-C Entered By: [name] on 04/06/2023 09:12:13 -------------------------------------------------------------------------------- Debridement Details Patient Name: Date of Service: [name], [name] 04/06/2023 9:30 A M Medical Record Number: [id number] Patient Account Number: [id number] Date of Birth/Sex: Treating RN: 12-May-1936 (87 y.o. [name], [name] Primary Care Provider: [name] Other Clinician: Referring Provider: Treating Provider/Extender: [name] in Treatment: 9 Debridement Performed for Assessment: Wound #1 Right,Anterior Lower Leg Performed By: Physician [name], PA Debridement Type: Debridement Level of Consciousness (Pre-procedure): Awake and Alert Pre-procedure Verification/Time Out Yes - 09:30 Taken: Start Time: 09:31 Pain Control: Lidocaine 5% topical ointment Percent of Wound Bed Debrided: 100% T Area Debrided (cm): otal 1.98 Tissue and other material debrided: Non-Viable, Slough, Biofilm, Slough Level: Non-Viable Tissue Debridement Description: Selective/Open Wound Instrument: Curette Bleeding: Minimum Hemostasis Achieved: Pressure End Time: 09:34 Procedural Pain: 0 Post Procedural  Pain: 0 Response to Treatment: Procedure was tolerated well Level of Consciousness (Post- Awake and Alert procedure): Post Debridement Measurements of Total Wound Length: (cm) 4.2 Width: (cm) 0.6 Depth: (cm) 0.1 Volume: (cm) 0.198 Character of Wound/Ulcer Post Debridement: Improved Post Procedure Diagnosis Same as Pre-procedure Electronic Signature(s) Unsigned Entered By: Shawn Stall on 04/06/2023 09:35:19 Signature(s): LASHAN, NELLUMS ([id number]) [id number] Date(s): [id number] Page 2 of 6 -------------------------------------------------------------------------------- HPI Details Patient Name: Date of Service: [name], [name] 04/06/2023 9:30 A M Medical Record Number: [id number] Patient Account Number: [id number] Date of Birth/Sex: Treating RN: Sep 27, 1936 (87 y.o. F) Primary Care Provider: [name] Other Clinician: Referring Provider: Treating Provider/Extender: [name] in Treatment: 9 History of Present Illness HPI Description: ADMISSION 02/02/23 Mrs. [name] is an 87 year old woman who lives in the independent [facility] the [facility]. She has been dealing with a leg wound since February 19 when she traumatized her right leg on a cleaning bucket. She has been using Bactroban and T and an Ace wrap. elfa Chronic low back pain, right hip pain, osteoporosis, hypertension, gait disease disorder she is not a diabetic. 02-16-2023 upon evaluation today patient appears to be doing excellent in regard to her wound although it does look a little bit dry overall I think we are still seeing some improvements here which is great news. Fortunately I do not see any evidence of active infection locally nor systemically which is great news. No fevers, chills, nausea, vomiting, or diarrhea. 02-23-2023 upon evaluation today patient appears to be doing well currently in regard to her wounds. She actually is doing  excellent and in fact the more medial portion of the wound is almost completely closed the lateral portion is much smaller I am very pleased with what we are seeing. 03-02-2023 upon evaluation today patient appears to be doing well currently in regard to her wound was actually showing signs of excellent improvement. Fortunately I do not see any evidence of active infection locally nor systemically  which is great news and overall I am extremely pleased with where things stand. There is no sign of infection. No fevers, chills, nausea, vomiting, or diarrhea. 03-09-2023 upon evaluation today patient appears to be doing well currently in regard to her wound. This is actually showing signs of improvement I am actually very pleased with where we stand I think she is continuing to move in the right direction. She is very pleased and happy to hear this. 03-16-2023 upon evaluation today patient appears to be doing well with regard to her wounds visually and I do feel like she is making good progress here. Fortunately there does not appear to be any signs of active infection locally nor systemically which is great news. No fevers, chills, nausea, vomiting, or diarrhea. 03-30-2023 upon evaluation today patient appears to be doing well currently in regard to her wound. She is actually been tolerating the dressing changes without complication. Fortunately I do not see any signs of active infection locally nor systemically which is great news and I think that she is moving in the right direction. 04-06-2023 upon evaluation today patient actually appears to be doing great in regard to her wound she has been tolerating the dressing changes without complication the collagen seems to have done very well over the past week for her this is great news. Electronic Signature(s) Signed: 04/06/2023 9:45:33 AM By: [name] PA-C Entered By: [name] on 04/06/2023  09:45:33 -------------------------------------------------------------------------------- Physical Exam Details Patient Name: Date of Service: [name], [name] 04/06/2023 9:30 A M Medical Record Number: [id number] Patient Account Number: [id number] Date of Birth/Sex: Treating RN: [birth year] (87 y.o. F) Primary Care Provider: [name] Other Clinician: Referring Provider: Treating Provider/Extender: [name], [name] in Treatment: 9 Constitutional Well-nourished and well-hydrated in no acute distress. Respiratory normal breathing without difficulty. Psychiatric this patient is able to make decisions and demonstrates good insight into disease process. Alert and Oriented x 3. pleasant and cooperative. Notes Upon inspection patient's wound bed actually showed signs of some need for sharp debridement and remove some of the slough and biofilm from the surface of the wound she tolerated that debridement today without complication postdebridement the wound bed is significantly improved. Electronic Signature(s) ZAKIYYAH, LIVERGOOD ([id number]) [id number] Page 3 of 6 Signed: 04/06/2023 9:45:49 AM By: [name] PA-C Entered By: [name] on 04/06/2023 09:45:48 -------------------------------------------------------------------------------- Physician Orders Details Patient Name: Date of Service: [name], Danissa H. 04/06/2023 9:30 A M Medical Record Number: [id number] Patient Account Number: [id number] Date of Birth/Sex: Treating RN: [birth year] (87 y.o. [name], [name] Primary Care Provider: [name] Other Clinician: Referring Provider: Treating Provider/Extender: [name] in Treatment: 9 Verbal / Phone Orders: No Diagnosis Coding ICD-10 Coding Code Description L97.811 Non-pressure chronic ulcer of other part of right lower leg limited to breakdown of skin S80.811D Abrasion, right lower leg,  subsequent encounter I87.311 Chronic venous hypertension (idiopathic) with ulcer of right lower extremity Follow-up Appointments ppointment in 1 week. - Dr. Leonard Schwartz room 8 04/13/2023 1100 Wednesday Return A ppointment in 2 weeks. - Dr. Leonard Schwartz room 8 04/20/2023 1100 Wednesday Return A Other: - [facility]/ Shower/ Hygiene May shower and wash wound with soap and water. Edema Control - Lymphedema / SCD / Other Bilateral Lower Extremities Avoid standing for long periods of time. Patient to wear own compression stockings every day. - pt to order from [facility]. Exercise regularly Wound Treatment Wound #1 - Lower Leg Wound  Laterality: Right, Anterior Cleanser: Soap and Water Every Other Day/30 Days Discharge Instructions: May shower and wash wound with dial antibacterial soap and water prior to dressing change. Peri-Wound Care: Skin Prep (Generic) Every Other Day/30 Days Discharge Instructions: Use skin prep as directed Prim Dressing: FIBRACOL Plus Dressing, 2x2 in (collagen) Every Other Day/30 Days ary Discharge Instructions: Moisten collagen with [place] Jelly or saline. Prim Dressing: Xeroform Occlusive Gauze Dressing, 4x4 in (Generic) Every Other Day/30 Days ary Discharge Instructions: Apply over the collagen as instructed Secondary Dressing: ALLEVYN Gentle Border, 5x5 (in/in) (Generic) Every Other Day/30 Days Discharge Instructions: Apply over primary dressing as directed. Electronic Signature(s) Unsigned Entered By: Shawn Stall on 04/06/2023 09:33:45 -------------------------------------------------------------------------------- Problem List Details Patient Name: Date of Service: [name], [name] CRADLE. 04/06/2023 9:30 A M Medical Record Number: [id number] Patient Account Number: [id number] Date of Birth/Sex: Treating RN: 12/09/1935 (87 y.o. F) Primary Care Provider: [name] Other Clinician: Referring Provider: Treating Provider/Extender: [name],  [name] in Treatment: [address], [place] H ([id number]) [id number] Page 4 of 6 Active Problems ICD-10 Encounter Code Description Active Date MDM Diagnosis L97.811 Non-pressure chronic ulcer of other part of right lower leg limited to breakdown 02/02/2023 No Yes of skin S80.811D Abrasion, right lower leg, subsequent encounter 02/02/2023 No Yes I87.311 Chronic venous hypertension (idiopathic) with ulcer of right lower extremity 02/02/2023 No Yes Inactive Problems Resolved Problems Electronic Signature(s) Signed: 04/06/2023 9:12:08 AM By: [name] PA-C Entered By: [name] on 04/06/2023 09:12:08 -------------------------------------------------------------------------------- Progress Note Details Patient Name: Date of Service: [name], [name] 04/06/2023 9:30 A M Medical Record Number: [id number] Patient Account Number: [id number] Date of Birth/Sex: Treating RN: 09-Apr-1936 (87 y.o. F) Primary Care Provider: [name] Other Clinician: Referring Provider: Treating Provider/Extender: [name], [name] in Treatment: 9 Subjective Chief Complaint Information obtained from Patient 02/03/2023; patient is here from the [facility] with an abrasion injury on her right lower leg History of Present Illness (HPI) ADMISSION 02/02/23 Mrs. Gunder is an 87 year old woman who lives in the independent part of the [facility]. She has been dealing with a leg wound since February 19 when she traumatized her right leg on a cleaning bucket. She has been using Bactroban and T and an Ace wrap. elfa Chronic low back pain, right hip pain, osteoporosis, hypertension, gait disease disorder she is not a diabetic. 02-16-2023 upon evaluation today patient appears to be doing excellent in regard to her wound although it does look a little bit dry overall I think we are still seeing some improvements here which is  great news. Fortunately I do not see any evidence of active infection locally nor systemically which is great news. No fevers, chills, nausea, vomiting, or diarrhea. 02-23-2023 upon evaluation today patient appears to be doing well currently in regard to her wounds. She actually is doing excellent and in fact the more medial portion of the wound is almost completely closed the lateral portion is much smaller I am very pleased with what we are seeing. 03-02-2023 upon evaluation today patient appears to be doing well currently in regard to her wound was actually showing signs of excellent improvement. Fortunately I do not see any evidence of active infection locally nor systemically which is great news and overall I am extremely pleased with where things stand. There is no sign of infection. No fevers, chills, nausea, vomiting, or diarrhea. 03-09-2023 upon evaluation today patient appears to be doing well currently in  regard to her wound. This is actually showing signs of improvement I am actually very pleased with where we stand I think she is continuing to move in the right direction. She is very pleased and happy to hear this. 03-16-2023 upon evaluation today patient appears to be doing well with regard to her wounds visually and I do feel like she is making good progress here. Fortunately there does not appear to be any signs of active infection locally nor systemically which is great news. No fevers, chills, nausea, vomiting, or diarrhea. 03-30-2023 upon evaluation today patient appears to be doing well currently in regard to her wound. She is actually been tolerating the dressing changes without complication. Fortunately I do not see any signs of active infection locally nor systemically which is great news and I think that she is moving in the right direction. 04-06-2023 upon evaluation today patient actually appears to be doing great in regard to her wound she has been tolerating the dressing changes  without complication the collagen seems to have done very well over the past week for her this is great news. ALEANNA, SURRENCY ([id number]) [id number] Page 5 of 6 Objective Constitutional Well-nourished and well-hydrated in no acute distress. Vitals Time Taken: 9:25 AM, Temperature: 98 F, Pulse: 86 bpm, Respiratory Rate: 20 breaths/min, Blood Pressure: 127/73 mmHg. Respiratory normal breathing without difficulty. Psychiatric this patient is able to make decisions and demonstrates good insight into disease process. Alert and Oriented x 3. pleasant and cooperative. General Notes: Upon inspection patient's wound bed actually showed signs of some need for sharp debridement and remove some of the slough and biofilm from the surface of the wound she tolerated that debridement today without complication postdebridement the wound bed is significantly improved. Integumentary (Hair, Skin) Wound #1 status is Open. Original cause of wound was Skin T ear/Laceration. The date acquired was: 01/10/2023. The wound has been in treatment 9 weeks. The wound is located on the Right,Anterior Lower Leg. The wound measures 4.2cm length x 0.6cm width x 0.1cm depth; 1.979cm^2 area and 0.198cm^3 volume. There is Fat Layer (Subcutaneous Tissue) exposed. There is no tunneling or undermining noted. There is a medium amount of serosanguineous drainage noted. The wound margin is distinct with the outline attached to the wound base. There is large (67-100%) red granulation within the wound bed. There is a small (1-33%) amount of necrotic tissue within the wound bed including Adherent Slough. The periwound skin appearance exhibited: Scarring. The periwound skin appearance did not exhibit: Callus, Crepitus, Excoriation, Induration, Rash, Dry/Scaly, Maceration, Atrophie Blanche, Cyanosis, Ecchymosis, Hemosiderin Staining, Mottled, Pallor, Rubor, Erythema. Periwound temperature was noted as No Abnormality.  The periwound has tenderness on palpation. Assessment Active Problems ICD-10 Non-pressure chronic ulcer of other part of right lower leg limited to breakdown of skin Abrasion, right lower leg, subsequent encounter Chronic venous hypertension (idiopathic) with ulcer of right lower extremity Procedures Wound #1 Pre-procedure diagnosis of Wound #1 is an Abrasion located on the Right,Anterior Lower Leg . There was a Selective/Open Wound Non-Viable Tissue Debridement with a total area of 1.98 sq cm performed by [name], PA. With the following instrument(s): Curette to remove Non-Viable tissue/material. Material removed includes Slough and Biofilm and after achieving pain control using Lidocaine 5% topical ointment. A time out was conducted at 09:30, prior to the start of the procedure. A Minimum amount of bleeding was controlled with Pressure. The procedure was tolerated well with a pain level of 0 throughout and a pain level of  0 following the procedure. Post Debridement Measurements: 4.2cm length x 0.6cm width x 0.1cm depth; 0.198cm^3 volume. Character of Wound/Ulcer Post Debridement is improved. Post procedure Diagnosis Wound #1: Same as Pre-Procedure Plan Follow-up Appointments: Return Appointment in 1 week. - Dr. Leonard Schwartz room 8 04/13/2023 1100 Wednesday Return Appointment in 2 weeks. - Dr. Leonard Schwartz room 8 04/20/2023 1100 Wednesday Other: - [facility]/ Shower/ Hygiene: May shower and wash wound with soap and water. Edema Control - Lymphedema / SCD / Other: Avoid standing for long periods of time. Patient to wear own compression stockings every day. - pt to order from [facility]. Exercise regularly WOUND #1: - Lower Leg Wound Laterality: Right, Anterior Cleanser: Soap and Water Every Other Day/30 Days Discharge Instructions: May shower and wash wound with dial antibacterial soap and water prior to dressing change. Peri-Wound Care: Skin Prep (Generic) Every Other Day/30  Days Discharge Instructions: Use skin prep as directed Prim Dressing: FIBRACOL Plus Dressing, 2x2 in (collagen) Every Other Day/30 Days ary Discharge Instructions: Moisten collagen with [place] Jelly or saline. Prim Dressing: Xeroform Occlusive Gauze Dressing, 4x4 in (Generic) Every Other Day/30 Days ary JEANNEDARC, KOBLE ([id number]) [id number] Page 6 of 6 Discharge Instructions: Apply over the collagen as instructed Secondary Dressing: ALLEVYN Gentle Border, 5x5 (in/in) (Generic) Every Other Day/30 Days Discharge Instructions: Apply over primary dressing as directed. 1. I would recommend currently that we have the patient continue to monitor for any signs of infection or worsening. Based on what I am seeing I do believe that the patient should continue to elevate her leg is much as possible she is using the collagen followed by the Xeroform gauze and changing this 3 times per week and again will get a be using the bordered foam dressing to cover. 2. I am good recommend as well she should continue to monitor for any signs of infection or worsening if anything changes she knows contact the office let me know. We will see patient back for reevaluation in 1 week here in the clinic. If anything worsens or changes patient will contact our office for additional recommendations. Electronic Signature(s) Signed: 04/06/2023 9:46:20 AM By: [name] PA-C Entered By: [name] on 04/06/2023 09:46:20 -------------------------------------------------------------------------------- SuperBill Details Patient Name: Date of Service: [name], Alannah H. 04/06/2023 Medical Record Number: [id number] Patient Account Number: [id number] Date of Birth/Sex: Treating RN: 1936-06-27 (87 y.o. [name] Primary Care Provider: [name] Other Clinician: Referring Provider: Treating Provider/Extender: [name] in Treatment: 9 Diagnosis  Coding ICD-10 Codes Code Description [phone] Non-pressure chronic ulcer of other part of right lower leg limited to breakdown of skin S80.811D Abrasion, right lower leg, subsequent encounter I87.311 Chronic venous hypertension (idiopathic) with ulcer of right lower extremity Facility Procedures : CPT4 Code: [id number] Description: 97597 - DEBRIDE WOUND 1ST 20 SQ CM OR < ICD-10 Diagnosis Description L97.811 Non-pressure chronic ulcer of other part of right lower leg limited to breakdown o Modifier: f skin Quantity: 1 Physician Procedures : CPT4 Code Description Modifier [id number] - WC PHYS DEBR WO ANESTH 20 SQ CM ICD-10 Diagnosis Description L97.811 Non-pressure chronic ulcer of other part of right lower leg limited to breakdown of skin Quantity: 1 Electronic Signature(s) Signed: 04/06/2023 9:48:17 AM By: [name] PA-C Entered By: [name] on 04/06/2023 09:48:17

## 2023-04-06 NOTE — Assessment & Plan Note (Signed)
Use Arnica gel prn

## 2023-04-06 NOTE — Assessment & Plan Note (Signed)
Coping OK 

## 2023-04-06 NOTE — Patient Instructions (Signed)
Take Metamucil daily

## 2023-04-06 NOTE — Assessment & Plan Note (Signed)
UA was ok On Premarin cream PV per GYN On Vesicare F/u w/Dr R Davis 

## 2023-04-06 NOTE — Assessment & Plan Note (Addendum)
Take Metamucil daily Senokot as to produce soft regular stools.

## 2023-04-06 NOTE — Progress Notes (Signed)
GAGE, NAGLE ([id number]) [id number] Page 1 of 5 Visit Report for 04/06/2023 Arrival Information Details Patient Name: Date of Service: [name], [name] 04/06/2023 9:30 A M Medical Record Number: [id number] Patient Account Number: [id number] Date of Birth/Sex: Treating RN: 1936/06/12 (87 y.o. [name], [name] Primary Care Marlinda Miranda: [name] Other Clinician: Referring Jazzlyn Huizenga: Treating Kayna Suppa/Extender: [name] in Treatment: 9 Visit Information History Since Last Visit Added or deleted any medications: No Patient Arrived: Ambulatory Any new allergies or adverse reactions: No Arrival Time: 09:23 Had a fall or experienced change in No Accompanied By: self activities of daily living that may affect Transfer Assistance: None risk of falls: Patient Identification Verified: Yes Signs or symptoms of abuse/neglect since last visito No Secondary Verification Process Completed: Yes Hospitalized since last visit: No Patient Requires Transmission-Based Precautions: No Implantable device outside of the clinic excluding No Patient Has Alerts: No cellular tissue based products placed in the center since last visit: Has Dressing in Place as Prescribed: Yes Pain Present Now: No Electronic Signature(s) Signed: 04/06/2023 11:31:33 AM By: Shawn Stall RN, BSN Entered By: Shawn Stall on 04/06/2023 09:23:34 -------------------------------------------------------------------------------- Encounter Discharge Information Details Patient Name: Date of Service: CO [name], [name] 04/06/2023 9:30 A M Medical Record Number: [id number] Patient Account Number: [id number] Date of Birth/Sex: Treating RN: Nov 06, 1936 (87 y.o. [name] Primary Care Emireth Cockerham: [name] Other Clinician: Referring Cyriah Childrey: Treating Kathrene Sinopoli/Extender: [name] in Treatment: 9 Encounter Discharge Information  Items Post Procedure Vitals Discharge Condition: Stable Temperature (F): 98 Ambulatory Status: Cane Pulse (bpm): 86 Discharge Destination: Home Respiratory Rate (breaths/min): 20 Transportation: Private Auto Blood Pressure (mmHg): 127/73 Accompanied By: self Schedule Follow-up Appointment: Yes Clinical Summary of Care: Electronic Signature(s) Signed: 04/06/2023 11:31:33 AM By: Shawn Stall RN, BSN Entered By: Shawn Stall on 04/06/2023 09:36:00 -------------------------------------------------------------------------------- Lower Extremity Assessment Details Patient Name: Date of Service: CO [name], [name] 04/06/2023 9:30 A M Medical Record Number: [id number] Patient Account Number: [id number] Date of Birth/Sex: Treating RN: 1936/02/18 (87 y.o. [name] Primary Care Herbie Lehrmann: [name] Other Clinician: Referring Zeya Balles: Treating Preet Mangano/Extender: [name], Georgina Quint Weeks in Treatment: 9 Edema Assessment Assessed: [Left: No] [Right: Yes] C[LeftMARAIYA, BERNATH ([id number])] [KGMWN: [id number] Page 2 of 5] Edema: [Left: N] [Right: o] Calf Left: Right: Point of Measurement: 28 cm From Medial Instep 31 cm Ankle Left: Right: Point of Measurement: 10 cm From Medial Instep 18.5 cm Vascular Assessment Pulses: Dorsalis Pedis Palpable: [Right:Yes] Electronic Signature(s) Signed: 04/06/2023 11:31:33 AM By: Shawn Stall RN, BSN Entered By: Shawn Stall on 04/06/2023 09:24:35 -------------------------------------------------------------------------------- Multi-Disciplinary Care Plan Details Patient Name: Date of Service: CO [name], [name] 04/06/2023 9:30 A M Medical Record Number: [id number] Patient Account Number: [id number] Date of Birth/Sex: Treating RN: 12/11/1935 (87 y.o. [name] Primary Care Zayla Agar: [name] Other Clinician: Referring Frayda Egley: Treating Keny Donald/Extender: [name] in Treatment: 9 Active Inactive Wound/Skin Impairment Nursing Diagnoses: Impaired tissue integrity Knowledge deficit related to ulceration/compromised skin integrity Goals: Patient/caregiver will verbalize understanding of skin care regimen Date Initiated: 02/02/2023 Target Resolution Date: 04/29/2023 Goal Status: Active Ulcer/skin breakdown will have a volume reduction of 30% by week 4 Date Initiated: 02/02/2023 Date Inactivated: 03/02/2023 Target Resolution Date: 03/02/2023 Goal Status: Met Interventions: Assess patient/caregiver ability to obtain necessary supplies Assess patient/caregiver ability to perform ulcer/skin care regimen upon admission and as needed Assess ulceration(s) every visit Provide  education on ulcer and skin care Notes: Electronic Signature(s) Signed: 04/06/2023 11:31:33 AM By: Shawn Stall RN, BSN Entered By: Shawn Stall on 04/06/2023 09:29:00 -------------------------------------------------------------------------------- Pain Assessment Details Patient Name: Date of Service: CO [name], [name] 04/06/2023 9:30 A M Medical Record Number: [id number] Patient Account Number: [id number] Date of Birth/Sex: Treating RN: 12-14-1935 (87 y.o. [name] Primary Care Valree Feild: [name] Other Clinician: Referring Estela Vinal: Treating Shalini Mair/Extender: [name], [name] in Treatment: [address], [place] H ([id number]) [id number] Page 3 of 5 Active Problems Location of Pain Severity and Description of Pain Patient Has Paino No Site Locations Pain Management and Medication Current Pain Management: Electronic Signature(s) Signed: 04/06/2023 11:31:33 AM By: Shawn Stall RN, BSN Entered By: Shawn Stall on 04/06/2023 09:23:40 -------------------------------------------------------------------------------- Patient/Caregiver Education Details Patient Name: Date of Service: CO [name],  [name] 5/8/2024andnbsp9:30 A M Medical Record Number: [id number] Patient Account Number: [id number] Date of Birth/Gender: Treating RN: [birth year] (87 y.o. [name] Primary Care Physician: [name] Other Clinician: Referring Physician: Treating Physician/Extender: [name], [name] in Treatment: 9 Education Assessment Education Provided To: Patient Education Topics Provided Wound/Skin Impairment: Handouts: Caring for Your Ulcer Methods: Explain/Verbal Responses: Reinforcements needed Electronic Signature(s) Signed: 04/06/2023 11:31:33 AM By: Shawn Stall RN, BSN Entered By: Shawn Stall on 04/06/2023 09:29:10 -------------------------------------------------------------------------------- Wound Assessment Details Patient Name: Date of Service: CO [name], [name] 04/06/2023 9:30 A M Medical Record Number: [id number] Patient Account Number: [id number] Date of Birth/Sex: Treating RN: 1936-04-18 (87 y.o. [name] Primary Care Karinna Beadles: [name] Other Clinician: Referring Kyrah Schiro: Treating Andree Heeg/Extender: [name], [name] in Treatment: [address] [place] H ([id number]) [id number] Page 4 of 5 Wound Status Wound Number: 1 Primary Etiology: Abrasion Wound Location: Right, Anterior Lower Leg Wound Status: Open Wounding Event: Skin Tear/Laceration Comorbid Cataracts, Asthma, Congestive Heart Failure, History: Osteoarthritis Date Acquired: 01/10/2023 Weeks Of Treatment: 9 Clustered Wound: Yes Photos Wound Measurements Length: (cm) Width: (cm) Depth: (cm) Clustered Quantity: Area: (cm) Volume: (cm) 4.2 % Reduction in Area: 93.3% 0.6 % Reduction in Volume: 93.3% 0.1 Epithelialization: Large (67-100%) 1 Tunneling: No 1.979 Undermining: No 0.198 Wound Description Classification: Full Thickness Without Exposed Sup Wound Margin: Distinct, outline attached Exudate  Amount: Medium Exudate Type: Serosanguineous Exudate Color: red, brown port Structures Foul Odor After Cleansing: No Slough/Fibrino Yes Wound Bed Granulation Amount: Large (67-100%) Exposed Structure Granulation Quality: Red Fascia Exposed: No Necrotic Amount: Small (1-33%) Fat Layer (Subcutaneous Tissue) Exposed: Yes Necrotic Quality: Adherent Slough Tendon Exposed: No Muscle Exposed: No Joint Exposed: No Bone Exposed: No Periwound Skin Texture Texture Color No Abnormalities Noted: No No Abnormalities Noted: No Callus: No Atrophie Blanche: No Crepitus: No Cyanosis: No Excoriation: No Ecchymosis: No Induration: No Erythema: No Rash: No Hemosiderin Staining: No Scarring: Yes Mottled: No Pallor: No Moisture Rubor: No No Abnormalities Noted: No Dry / Scaly: No Temperature / Pain Maceration: No Temperature: No Abnormality Tenderness on Palpation: Yes Treatment Notes Wound #1 (Lower Leg) Wound Laterality: Right, Anterior Cleanser Soap and Water Discharge Instruction: May shower and wash wound with dial antibacterial soap and water prior to dressing change. Peri-Wound Care Skin Prep Discharge Instruction: Use skin prep as directed [name], [name] ([id number]) [id number].pdf Page 5 of 5 Topical Primary Dressing FIBRACOL Plus Dressing, 2x2 in (collagen) Discharge Instruction: Moisten collagen with [place] Jelly or saline. Xeroform Occlusive Gauze Dressing, 4x4 in Discharge Instruction: Apply over the collagen as instructed Secondary Dressing ALLEVYN Gentle Border, 5x5 (  in/in) Discharge Instruction: Apply over primary dressing as directed. Secured With Compression Wrap Compression Stockings Facilities manager) Signed: 04/06/2023 11:31:33 AM By: Shawn Stall RN, BSN Entered By: Shawn Stall on 04/06/2023 09:27:56 -------------------------------------------------------------------------------- Vitals Details Patient Name: Date of  Service: CO [name], [name] 04/06/2023 9:30 A M Medical Record Number: [id number] Patient Account Number: [id number] Date of Birth/Sex: Treating RN: May 15, 1936 (86 y.o. [name] Primary Care Brena Windsor: [name] Other Clinician: Referring Chrisandra Wiemers: Treating Fusako Tanabe/Extender: [name] in Treatment: 9 Vital Signs Time Taken: 09:25 Temperature (F): 98 Pulse (bpm): 86 Respiratory Rate (breaths/min): 20 Blood Pressure (mmHg): 127/73 Reference Range: 80 - 120 mg / dl Electronic Signature(s) Signed: 04/06/2023 11:31:33 AM By: Shawn Stall RN, BSN Entered By: Shawn Stall on 04/06/2023 09:26:06

## 2023-04-07 ENCOUNTER — Ambulatory Visit: Payer: Medicare Other | Admitting: Sports Medicine

## 2023-04-07 ENCOUNTER — Ambulatory Visit (INDEPENDENT_AMBULATORY_CARE_PROVIDER_SITE_OTHER): Payer: Medicare Other | Admitting: Sports Medicine

## 2023-04-07 VITALS — BP 143/62 | Ht 60.0 in

## 2023-04-07 DIAGNOSIS — M5136 Other intervertebral disc degeneration, lumbar region: Secondary | ICD-10-CM

## 2023-04-07 NOTE — Progress Notes (Signed)
Complaint: Follow-up of low back pain with sciatica  This patient has significant degenerative joint disease and degenerative disc changes throughout her lumbar spine We have been treating her for a variety of SI joint, hip pain and low back pain issues When seen on March 29, 2023 her radiating pain was extending below her knee We gave her a short course of prednisone for 1 week This made a significant difference in fact she said she started walking without even using a cane or a walker She felt like her pain was totally resolved until she fell last night when going out to a restaurant Fortunately on the fall she bumped her head and her neck was slightly strained but she says it feels much better today She did not have any loss of consciousness She has a cervical fusion and difficulty with extending her neck  Physical exam Pleasant elderly female in no acute distress BP (!) 143/62   Ht 5' (1.524 m)   BMI 21.87 kg/m   Today the patient has her normal range of motion of her low back on flexion extension lateral bending and rotation with no pain She is able to stand on her toes She is able stand on her heels She is able to do a straight leg raise bilaterally None of these motions cause pain  Her walking gait looks remarkably stable even without a cane This is a significant improvement over her baseline

## 2023-04-07 NOTE — Assessment & Plan Note (Signed)
Patient had a dramatic response to prednisone I advised her that we would not continue this as it is not a good medicine to take long-term We went over her home exercise program and she will restart this She was doing well with home exercises and gradually making progress with her gait I think she can do most of her walking with just a cane for protection Getting off the walker is actually helpful to her low back if she does not have to lean forward  She continues to see me regularly for monitoring and will see me in 2 to 4 weeks

## 2023-04-11 ENCOUNTER — Telehealth: Payer: Self-pay | Admitting: *Deleted

## 2023-04-11 NOTE — Telephone Encounter (Signed)
Rec'd fax pt need PA on Solifenacin Succinate submitted w/ (Key: [id number]) PA Your has been sent to [facility]...Raechel Chute

## 2023-04-12 ENCOUNTER — Ambulatory Visit (INDEPENDENT_AMBULATORY_CARE_PROVIDER_SITE_OTHER): Payer: Medicare Other | Admitting: Sports Medicine

## 2023-04-12 VITALS — BP 130/60 | Ht 60.0 in | Wt 113.0 lb

## 2023-04-12 DIAGNOSIS — M7061 Trochanteric bursitis, right hip: Secondary | ICD-10-CM

## 2023-04-12 NOTE — Assessment & Plan Note (Signed)
She has had some trochanteric pain in the past I think her fall has triggered a return of this She had such a great response to prednisone before she wanted to try this again but I discouraged that We will have her use some tramadol along with her Tylenol over the next 2 weeks Ice massage  I reassured her that I think she can resume the 4 exercises that I gave her to help improve her abduction and hip strength stability for her gait  She will see me in a couple weeks

## 2023-04-12 NOTE — Progress Notes (Signed)
Chief complaint back pain radiating into her right thigh  Patient has significant lumbar degenerative changes She fell last week but was doing okay when we saw her in the clinic 2 days afterwards though she started getting more pain radiating down her lateral thigh to the distal quadriceps She is taking an occasional tramadol and that has helped She is still walking better and just using a cane and not using the walker  Physical exam Elderly female in no acute distress BP 130/60   Ht 5' (1.524 m)   Wt 113 lb (51.3 kg)   BMI 22.07 kg/m   She has tenderness along her right greater trochanteric region and down the lateral thigh to just above the patella Minimal tenderness over the low back and over the SI joints Full range of motion of the right hip Her walking gait looks fairly stable today even without cane Neurologic testing reveals no deficits in the right lower extremity

## 2023-04-12 NOTE — Telephone Encounter (Signed)
Rec'd determination med was approved. " [id number]. SOLIFENACIN TAB 10MG  is approved through 11/29/2023. Your patient may now fill this prescription and it will be covered." Sent approval info to [place].Marland KitchenRaechel Chute

## 2023-04-13 ENCOUNTER — Encounter (HOSPITAL_BASED_OUTPATIENT_CLINIC_OR_DEPARTMENT_OTHER): Payer: Medicare Other | Admitting: Physician Assistant

## 2023-04-13 DIAGNOSIS — I10 Essential (primary) hypertension: Secondary | ICD-10-CM | POA: Diagnosis not present

## 2023-04-13 DIAGNOSIS — L97811 Non-pressure chronic ulcer of other part of right lower leg limited to breakdown of skin: Secondary | ICD-10-CM | POA: Diagnosis not present

## 2023-04-13 DIAGNOSIS — S81811A Laceration without foreign body, right lower leg, initial encounter: Secondary | ICD-10-CM | POA: Diagnosis not present

## 2023-04-13 DIAGNOSIS — G8929 Other chronic pain: Secondary | ICD-10-CM | POA: Diagnosis not present

## 2023-04-13 DIAGNOSIS — I87311 Chronic venous hypertension (idiopathic) with ulcer of right lower extremity: Secondary | ICD-10-CM | POA: Diagnosis not present

## 2023-04-13 DIAGNOSIS — S80811A Abrasion, right lower leg, initial encounter: Secondary | ICD-10-CM | POA: Diagnosis not present

## 2023-04-13 NOTE — Progress Notes (Signed)
[name] [name] ([id number]) [id number] Page 1 of 3 Visit Report for 04/13/2023 Chief Complaint Document Details Patient Name: Date of Service: [name] [name], [name] [name] 04/13/2023 11:00 A M Medical Record Number: [id number] Patient Account Number: [id number] Date of Birth/Sex: Treating RN: Sep 11, 1936 (87 y.o. F) Primary Care Provider: [name] Other Clinician: Referring Provider: Treating Provider/Extender: [name], [name] in Treatment: 10 Information Obtained from: Patient Chief Complaint 02/03/2023; patient is here from the [facility] with an abrasion injury on her right lower leg Electronic Signature(s) Signed: 04/13/2023 11:12:23 AM By: [name] PA-C Entered By: [name] on 04/13/2023 11:12:23 -------------------------------------------------------------------------------- Physician Orders Details Patient Name: Date of Service: [name] [name] H. 04/13/2023 11:00 A M Medical Record Number: [id number] Patient Account Number: [id number] Date of Birth/Sex: Treating RN: Nov 08, 1936 (87 y.o. [name] [name] Primary Care Provider: [name] Other Clinician: Referring Provider: Treating Provider/Extender: [name] in Treatment: 10 Verbal / Phone Orders: No Diagnosis Coding ICD-10 Coding Code Description L97.811 Non-pressure chronic ulcer of other part of right lower leg limited to breakdown of skin S80.811D Abrasion, right lower leg, subsequent encounter I87.311 Chronic venous hypertension (idiopathic) with ulcer of right lower extremity Follow-up Appointments ppointment in 1 week. - Dr. Leonard Schwartz room 8 04/20/2023 1100 Wednesday Return A ppointment in 2 weeks. - Dr. Leonard Schwartz room 8 04/27/2023 1230 Wednesday Return A Other: - [facility]/ Shower/ Hygiene May shower and wash wound with soap and water. Edema Control - Lymphedema / SCD / Other Bilateral Lower  Extremities Elevate legs to the level of the heart or above for 30 minutes daily and/or when sitting for 3-4 times a day throughout the day. Avoid standing for long periods of time. Patient to wear own compression stockings every day. - pt to order from [facility]. Exercise regularly Wound Treatment Wound #1 - Lower Leg Wound Laterality: Right, Anterior Cleanser: Soap and Water Every Other Day/30 Days Discharge Instructions: May shower and wash wound with dial antibacterial soap and water prior to dressing change. Peri-Wound Care: Skin Prep (DME) (Generic) Every Other Day/30 Days Discharge Instructions: Use skin prep as directed Prim Dressing: PolyMem Non-Adhesive Dressing, 4x4 in Every Other Day/30 Days ary Discharge Instructions: Apply to wound bed as instructed Secondary Dressing: ALLEVYN Gentle Border, 5x5 (in/in) (DME) (Generic) Every Other Day/30 Days Discharge Instructions: Apply over primary dressing as directed. [name] [name], [name] [name] ([id number]) [id number] Page 2 of 3 Compression Wrap: tubigrip size D Every Other Day/30 Days Discharge Instructions: apply one layer to foot and leg. Electronic Signature(s) Unsigned Entered By: Shawn Stall on 04/13/2023 11:20:04 -------------------------------------------------------------------------------- Problem List Details Patient Name: Date of Service: [name] [name] [place]. 04/13/2023 11:00 A M Medical Record Number: [id number] Patient Account Number: [id number] Date of Birth/Sex: Treating RN: 06/30/1936 (87 y.o. 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 04/13/2023 Medical Record Number: [id number] Patient Account  Number: [id number] Date of Birth/Sex: Treating RN: 1936/09/29 (87 y.o. [name] [name] Primary Care Provider: [name] Other Clinician: Referring Provider: Treating Provider/Extender: [name] in Treatment: 10 Diagnosis Coding ICD-10 Codes Code Description [phone] Non-pressure chronic ulcer of other part of right lower leg limited to breakdown of skin S80.811D Abrasion, right lower leg, subsequent encounter I87.311 Chronic venous hypertension (idiopathic) with ulcer of right lower extremity Facility Procedures [name] [name] ([id number]): CPT4 Code Description Judie Petit [id number] - WOUND CARE VISIT-LEV 3 EST PT [id number] Page 3 of 3: odifier Quantity 1 Electronic Signature(s) Unsigned Entered By: Shawn Stall on 04/13/2023 11:20:56 Signature(s): Date(s):

## 2023-04-13 NOTE — Progress Notes (Signed)
BRIGID, CHASE ([id number]) [id number] Page 1 of 1 Visit Report for 04/13/2023 Fall Risk Assessment Details Patient Name: Date of Service: [name], [name] 04/13/2023 11:00 A M Medical Record Number: [id number] Patient Account Number: [id number] Date of Birth/Sex: Treating RN: [birth year] (87 y.o. Orville Govern Primary Care Lazarus [place]: [name] Other Clinician: Referring Joycelyn Liska: Treating Rayah Fines/Extender: [name], [name] in Treatment: 10 Fall Risk Assessment Items Have you had 2 or more falls in the last 12 monthso 0 No Have you had any fall that resulted in injury in the last 12 monthso 0 No FALLS RISK SCREEN History of falling - immediate or within 3 months 25 Yes Secondary diagnosis (Do you have 2 or more medical diagnoseso) 0 No Ambulatory aid None/bed rest/wheelchair/nurse 0 No Crutches/cane/walker 15 Yes Furniture 0 No Intravenous therapy Access/Saline/Heparin Lock 0 No Gait/Transferring Normal/ bed rest/ wheelchair 0 No Weak (short steps with or without shuffle, stooped but able to lift head while walking, may seek 10 Yes support from furniture) Impaired (short steps with shuffle, may have difficulty arising from chair, head down, impaired 0 No balance) Mental Status Oriented to own ability 0 Yes Electronic Signature(s) Signed: 04/13/2023 2:22:42 PM By: Redmond Pulling RN, BSN Entered By: Redmond Pulling on 04/13/2023 11:02:34

## 2023-04-18 ENCOUNTER — Ambulatory Visit (INDEPENDENT_AMBULATORY_CARE_PROVIDER_SITE_OTHER): Payer: Medicare Other | Admitting: Internal Medicine

## 2023-04-18 ENCOUNTER — Encounter: Payer: Self-pay | Admitting: Internal Medicine

## 2023-04-18 ENCOUNTER — Ambulatory Visit: Payer: Medicare Other | Admitting: Family Medicine

## 2023-04-18 VITALS — BP 130/72 | HR 80 | Temp 98.2°F | Ht 60.0 in

## 2023-04-18 DIAGNOSIS — H0014 Chalazion left upper eyelid: Secondary | ICD-10-CM | POA: Diagnosis not present

## 2023-04-18 DIAGNOSIS — G8929 Other chronic pain: Secondary | ICD-10-CM | POA: Diagnosis not present

## 2023-04-18 DIAGNOSIS — M545 Low back pain, unspecified: Secondary | ICD-10-CM

## 2023-04-18 HISTORY — DX: Chalazion left upper eyelid: H00.14

## 2023-04-18 MED ORDER — CEFUROXIME AXETIL 250 MG PO TABS: 250.0000 mg | ORAL_TABLET | Freq: Two times a day (BID) | ORAL | 0 refills | Status: DC

## 2023-04-18 MED ORDER — ERYTHROMYCIN 5 MG/GM OP OINT: 1.0000 | TOPICAL_OINTMENT | Freq: Every day | OPHTHALMIC | 0 refills | Status: DC

## 2023-04-18 MED ORDER — METHYLPREDNISOLONE 4 MG PO TBPK: ORAL_TABLET | ORAL | 0 refills | Status: DC

## 2023-04-18 NOTE — Assessment & Plan Note (Addendum)
X2 lesions Keflex po - too big... Will use Ceftin F/u w/Ophthalmology Erythro oint

## 2023-04-18 NOTE — Patient Instructions (Signed)
HurryCane cane

## 2023-04-18 NOTE — Assessment & Plan Note (Signed)
Worse - R sciatica Medrol pac if ok w/Dr Darrick Penna (has appt tomorrow)

## 2023-04-18 NOTE — Progress Notes (Signed)
Subjective:  Patient ID: [name], female    DOB: 1936/03/18  Age: 87 y.o. MRN: [id number]  CC: Leg Pain (Rt leg pain after fall, rt thigh soreness, left eye swelling and redness)   HPI [name] presents for  L upper eyelid pain and redness w 2 bumps starting 1 wk ago. C/o sciatica - seeing Dr Darrick Penna. Karena Addison fell backwards on the pavement on May 8th. She went to dinner, ate. No pain x 2 days... Pain in the back got worse 2 d later.   Outpatient Medications Prior to Visit  Medication Sig Dispense Refill   acetaminophen (TYLENOL) 500 MG tablet Take 1,000 mg by mouth every 6 (six) hours as needed for mild pain.     albuterol (VENTOLIN HFA) 108 (90 Base) MCG/ACT inhaler Take 2 puffs every 4-6 hours as needed     amLODipine (NORVASC) 2.5 MG tablet Take 1 tablet (2.5 mg total) by mouth daily. 30 tablet 11   Azelastine HCl 0.15 % SOLN Place into both nostrils. Place into both nostrils.     Calcium Carbonate-Vitamin D (CALCIUM CARBONATE W/VITAMIN D PO) Take 1 tablet by mouth daily.     desonide (DESOWEN) 0.05 % cream Apply 1 application topically as needed.     diclofenac Sodium (VOLTAREN) 1 % GEL APPLY 4 GRAMS 4 TIMES DAILY. 200 g 3   EPINEPHrine (EPIPEN 2-PAK) 0.3 mg/0.3 mL IJ SOAJ injection Inject 0.3 mLs (0.3 mg total) into the muscle as needed. 1 Device 1   famotidine (PEPCID) 40 MG tablet Take 1 tablet (40 mg total) by mouth at bedtime. NEEDS OFFICE VISIT FOR ADDITIONAL REFILLS 30 tablet 2   ipratropium (ATROVENT) 0.06 % nasal spray Place 1 spray into the nose 3 (three) times daily as needed for rhinitis. 15 mL 0   levocetirizine (XYZAL) 5 MG tablet Take 5 mg by mouth every evening.     levothyroxine (SYNTHROID) 50 MCG tablet TAKE ONE TABLET BY MOUTH DAILY BEFORE BREAKFAST 30 tablet 5   loratadine (CLARITIN) 10 MG tablet Take 1 tablet (10 mg total) by mouth daily. 100 tablet 3   LORazepam (ATIVAN) 1 MG tablet Take 2-3 tablets at bedtime and 1-2 tablets in the daytime as needed.  150 tablet 2   memantine (NAMENDA) 5 MG tablet Take 5 mg by mouth 2 (two) times daily.     montelukast (SINGULAIR) 10 MG tablet Take 1 tablet (10 mg total) by mouth at bedtime. 90 tablet 1   oxymetazoline (AFRIN 12 HOUR) 0.05 % nasal spray Place 1 spray into both nostrils daily at 2 am.     polyethylene glycol powder (GLYCOLAX/MIRALAX) 17 GM/SCOOP powder Take 17 g by mouth 2 (two) times daily as needed for moderate constipation. 500 g 3   predniSONE (DELTASONE) 20 MG tablet Take 1 tablet (20 mg total) by mouth 2 (two) times daily. 10 tablet 0   psyllium (METAMUCIL SMOOTH TEXTURE) 58.6 % powder 1 scoop daily 283 g 6   simvastatin (ZOCOR) 40 MG tablet TAKE ONE TABLET ONCE DAILY 90 tablet 3   solifenacin (VESICARE) 10 MG tablet Take 1 tablet (10 mg total) by mouth daily as needed (bladder). 90 tablet 3   traMADol (ULTRAM) 50 MG tablet Take 1 tablet (50 mg total) by mouth every 6 (six) hours as needed for severe pain. 120 tablet 2   triamcinolone (NASACORT) 55 MCG/ACT AERO nasal inhaler Place 2 sprays into the nose as needed.     Gauze Pads & Dressings (  TELFA ADHESIVE DRESSING) 3"X4" PADS Use qd (Patient not taking: Reported on 04/06/2023) 20 each 0   No facility-administered medications prior to visit.    ROS: Review of Systems  Constitutional:  Negative for activity change, appetite change, chills, fatigue and unexpected weight change.  HENT:  Negative for congestion, mouth sores and sinus pressure.   Eyes:  Positive for pain. Negative for photophobia, discharge, redness, itching and visual disturbance.  Respiratory:  Negative for cough and chest tightness.   Gastrointestinal:  Negative for abdominal pain and nausea.  Genitourinary:  Negative for difficulty urinating, frequency and vaginal pain.  Musculoskeletal:  Positive for back pain and gait problem.  Skin:  Positive for color change and rash. Negative for pallor.  Neurological:  Negative for dizziness, tremors, weakness, numbness and  headaches.  Psychiatric/Behavioral:  Negative for confusion and sleep disturbance. The patient is not nervous/anxious.     Objective:  BP 130/72 (BP Location: Right Arm, Patient Position: Sitting, Cuff Size: Normal)   Pulse 80   Temp 98.2 F (36.8 C) (Oral)   Ht 5' (1.524 m)   SpO2 95%   BMI 22.07 kg/m   BP Readings from Last 3 Encounters:  04/18/23 130/72  04/12/23 130/60  04/07/23 (!) 143/62    Wt Readings from Last 3 Encounters:  04/12/23 113 lb (51.3 kg)  04/06/23 112 lb (50.8 kg)  03/29/23 116 lb (52.6 kg)    Physical Exam Constitutional:      General: She is not in acute distress.    Appearance: She is well-developed.  HENT:     Head: Normocephalic.     Right Ear: External ear normal.     Left Ear: External ear normal.     Nose: Nose normal.  Eyes:     General:        Right eye: No discharge.        Left eye: No discharge.     Conjunctiva/sclera: Conjunctivae normal.     Pupils: Pupils are equal, round, and reactive to light.  Neck:     Thyroid: No thyromegaly.     Vascular: No JVD.     Trachea: No tracheal deviation.  Cardiovascular:     Rate and Rhythm: Normal rate and regular rhythm.     Heart sounds: Normal heart sounds.  Pulmonary:     Effort: No respiratory distress.     Breath sounds: No stridor. No wheezing.  Abdominal:     General: Bowel sounds are normal. There is no distension.     Palpations: Abdomen is soft. There is no mass.     Tenderness: There is no abdominal tenderness. There is no guarding or rebound.  Musculoskeletal:        General: Tenderness present.     Cervical back: Normal range of motion and neck supple. No rigidity.  Lymphadenopathy:     Cervical: No cervical adenopathy.  Skin:    Findings: No erythema or rash.  Neurological:     Cranial Nerves: No cranial nerve deficit.     Motor: No abnormal muscle tone.     Coordination: Coordination normal.     Gait: Gait abnormal.     Deep Tendon Reflexes: Reflexes normal.   Psychiatric:        Behavior: Behavior normal.        Thought Content: Thought content normal.        Judgment: Judgment normal.   Using a walker R strait  leg elev +/-, L -  Lab Results  Component Value Date   WBC 6.0 03/16/2023   HGB 13.4 03/16/2023   HCT 39.1 03/16/2023   PLT 342.0 03/16/2023   GLUCOSE 90 04/01/2023   CHOL 164 04/01/2023   TRIG 114.0 04/01/2023   HDL 51.60 04/01/2023   LDLDIRECT 98.0 08/13/2021   LDLCALC 90 04/01/2023   ALT 17 04/01/2023   AST 20 04/01/2023   NA 132 (L) 04/01/2023   K 4.2 04/01/2023   CL 96 04/01/2023   CREATININE 0.62 04/01/2023   BUN 16 04/01/2023   CO2 28 04/01/2023   TSH 1.29 03/16/2023   HGBA1C 5.5 06/13/2020    ECHOCARDIOGRAM COMPLETE  Result Date: 02/11/2023    ECHOCARDIOGRAM REPORT   Patient Name:   JONALYN MEASE Date of Exam: 02/11/2023 Medical Rec #:  [id number]     Height:       60.5 in Accession #:    [id number]    Weight:       116.0 lb Date of Birth:  [birth year]      BSA:          1.490 m Patient Age:    86 years      BP:           133/76 mmHg Patient Gender: F             HR:           73 bpm. Exam Location:  Outpatient Procedure: 2D Echo, Color Doppler and Cardiac Doppler Indications:    I50.9* Heart failure (unspecified)  History:        Patient has prior history of Echocardiogram examinations, most                 recent 06/25/2022. Risk Factors:Dyslipidemia.  Sonographer:    Irving Burton Senior RDCS Referring Phys: [id number] DANIEL R BENSIMHON IMPRESSIONS  1. Left ventricular ejection fraction, by estimation, is 55 to 60%. The left ventricle has normal function. The left ventricle has no regional wall motion abnormalities. Left ventricular diastolic parameters are consistent with Grade I diastolic dysfunction (impaired relaxation).  2. Right ventricular systolic function is normal. The right ventricular size is normal. There is normal pulmonary artery systolic pressure.  3. The mitral valve is normal in structure. Mild mitral valve  regurgitation. No evidence of mitral stenosis.  4. The aortic valve is tricuspid. There is mild calcification of the aortic valve. Aortic valve regurgitation is mild. Aortic valve sclerosis/calcification is present, without any evidence of aortic stenosis. Aortic regurgitation PHT measures 436 msec. Aortic valve area, by VTI measures 1.81 cm. Aortic valve mean gradient measures 5.0 mmHg. Aortic valve Vmax measures 1.45 m/s.  5. The inferior vena cava is normal in size with greater than 50% respiratory variability, suggesting right atrial pressure of 3 mmHg. FINDINGS  Left Ventricle: Left ventricular ejection fraction, by estimation, is 55 to 60%. The left ventricle has normal function. The left ventricle has no regional wall motion abnormalities. The left ventricular internal cavity size was normal in size. There is  no left ventricular hypertrophy. Left ventricular diastolic parameters are consistent with Grade I diastolic dysfunction (impaired relaxation). Right Ventricle: The right ventricular size is normal. No increase in right ventricular wall thickness. Right ventricular systolic function is normal. There is normal pulmonary artery systolic pressure. The tricuspid regurgitant velocity is 2.35 m/s, and  with an assumed right atrial pressure of 3 mmHg, the estimated right ventricular systolic pressure is 25.1 mmHg. Left Atrium: Left atrial size was normal in size. Right Atrium:  Right atrial size was normal in size. Pericardium: There is no evidence of pericardial effusion. Mitral Valve: The mitral valve is normal in structure. Mild mitral valve regurgitation. No evidence of mitral valve stenosis. Tricuspid Valve: The tricuspid valve is normal in structure. Tricuspid valve regurgitation is not demonstrated. No evidence of tricuspid stenosis. Aortic Valve: The aortic valve is tricuspid. There is mild calcification of the aortic valve. Aortic valve regurgitation is mild. Aortic regurgitation PHT measures 436  msec. Aortic valve sclerosis/calcification is present, without any evidence of aortic stenosis. Aortic valve mean gradient measures 5.0 mmHg. Aortic valve peak gradient measures 8.4 mmHg. Aortic valve area, by VTI measures 1.81 cm. Pulmonic Valve: The pulmonic valve was normal in structure. Pulmonic valve regurgitation is trivial. No evidence of pulmonic stenosis. Aorta: The aortic root is normal in size and structure. Venous: The inferior vena cava is normal in size with greater than 50% respiratory variability, suggesting right atrial pressure of 3 mmHg. IAS/Shunts: No atrial level shunt detected by color flow Doppler.  LEFT VENTRICLE PLAX 2D LVIDd:         4.00 cm   Diastology LVIDs:         2.90 cm   LV e' medial:    5.44 cm/s LV PW:         0.80 cm   LV E/e' medial:  8.4 LV IVS:        0.70 cm   LV e' lateral:   4.90 cm/s LVOT diam:     1.95 cm   LV E/e' lateral: 9.3 LV SV:         51 LV SV Index:   34 LVOT Area:     2.99 cm  RIGHT VENTRICLE RV S prime:     13.50 cm/s TAPSE (M-mode): 1.6 cm LEFT ATRIUM             Index        RIGHT ATRIUM           Index LA diam:        2.60 cm 1.74 cm/m   RA Area:     13.40 cm LA Vol (A2C):   43.7 ml 29.33 ml/m  RA Volume:   30.70 ml  20.60 ml/m LA Vol (A4C):   25.6 ml 17.18 ml/m LA Biplane Vol: 33.1 ml 22.21 ml/m  AORTIC VALVE AV Area (Vmax):    1.78 cm AV Area (Vmean):   1.81 cm AV Area (VTI):     1.81 cm AV Vmax:           145.00 cm/s AV Vmean:          101.000 cm/s AV VTI:            0.282 m AV Peak Grad:      8.4 mmHg AV Mean Grad:      5.0 mmHg LVOT Vmax:         86.60 cm/s LVOT Vmean:        61.200 cm/s LVOT VTI:          0.171 m LVOT/AV VTI ratio: 0.61 AI PHT:            436 msec  AORTA Ao Root diam: 2.90 cm Ao Asc diam:  2.90 cm MITRAL VALVE                  TRICUSPID VALVE MV Area (PHT): 3.48 cm       TR Peak grad:   22.1 mmHg MV Decel  Time: 218 msec       TR Vmax:        235.00 cm/s MR Peak grad:    114.5 mmHg MR Mean grad:    64.0 mmHg    SHUNTS MR  Vmax:         535.00 cm/s  Systemic VTI:  0.17 m MR Vmean:        360.0 cm/s   Systemic Diam: 1.95 cm MR PISA:         1.01 cm MR PISA Eff ROA: 7 mm MR PISA Radius:  0.40 cm MV E velocity: 45.50 cm/s MV A velocity: 85.10 cm/s MV E/A ratio:  0.53 Arvilla Meres MD Electronically signed by Arvilla Meres MD Signature Date/Time: 02/11/2023/2:06:19 PM    Final     Assessment & Plan:   Problem List Items Addressed This Visit     Low back pain    Worse - R sciatica Medrol pac if ok w/Dr Darrick Penna (has appt tomorrow)      Relevant Medications   methylPREDNISolone (MEDROL DOSEPAK) 4 MG TBPK tablet   Chalazion left upper eyelid - Primary    X2 lesions Keflex po - too big... Will use Ceftin F/u w/Ophthalmology Erythro oint          Meds ordered this encounter  Medications   cefUROXime (CEFTIN) 250 MG tablet    Sig: Take 1 tablet (250 mg total) by mouth 2 (two) times daily with a meal.    Dispense:  14 tablet    Refill:  0   erythromycin ophthalmic ointment    Sig: Place 1 Application into both eyes at bedtime.    Dispense:  3.5 g    Refill:  0   methylPREDNISolone (MEDROL DOSEPAK) 4 MG TBPK tablet    Sig: As directed    Dispense:  21 tablet    Refill:  0      Follow-up: Return in about 4 weeks (around 05/16/2023) for a follow-up visit.  Sonda Primes, MD

## 2023-04-19 ENCOUNTER — Ambulatory Visit (INDEPENDENT_AMBULATORY_CARE_PROVIDER_SITE_OTHER): Payer: Medicare Other | Admitting: Sports Medicine

## 2023-04-19 VITALS — BP 122/58 | Ht 60.0 in | Wt 113.0 lb

## 2023-04-19 DIAGNOSIS — M5136 Other intervertebral disc degeneration, lumbar region: Secondary | ICD-10-CM | POA: Diagnosis not present

## 2023-04-19 NOTE — Assessment & Plan Note (Signed)
Her radicular symptoms have gotten worse and she has significant degenerative changes throughout her lumbar spine I think it is reasonable to do the Medrol Dosepak again although I did explain to her the limitation of using steroids too often With this in mind I think seeing neurosurgery and seeing if they think a directed lumbar injection would help seems reasonable She is not interested in surgery at her age if she can avoid it  Another option would be to start her on low-dose gabapentin and see if she got some benefit although we would need to be careful with this because of side effects at her age  She will try the water based physical therapy that I suggested before  I plan to see her after her neurosurgical and physical therapy visits

## 2023-04-19 NOTE — Progress Notes (Signed)
Chief complaint low back pain radiating to the right hip to the thigh and all the way to the foot  Patient has been seeing me frequently for low back pain.  She has intermittently responded to exercise programs when it seemed more standard musculoskeletal issues This past month when she had significant radicular symptoms of prednisone Dosepak knocked out most of her symptoms and she was able to go to walking with very little assistance except for her cane She had a fall after she had that recovery and since then her radicular symptoms have come back  Now her pain radiates from the right side into her buttocks around the front of the thigh down the inside of her calf to the top of her foot  Previous lumbar spine films and MRI show significant degenerative lumbar changes  Yesterday Dr. Posey Rea prescribed her a Dosepak of Medrol again but wanted to wait until she saw me  She saw Dr. 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 in the past and plans to see him again on May 29  She is now back to using periodic tramadol along with her Tylenol and topical Voltaren to help control her pain pattern  Physical exam Patient looks uncomfortable today but not in acute distress BP (!) 122/58   Ht 5' (1.524 m)   Wt 113 lb (51.3 kg)   BMI 22.07 kg/m   She has tenderness to palpation over her right SI joint her right greater trochanter enteric area Her skin is hypersensitive to light palpation over the lateral and anterior thigh Neurologically she still has normal strength for her in her right lower extremity She is able to walk with minimal assistance with a walking stick

## 2023-04-20 ENCOUNTER — Encounter (HOSPITAL_BASED_OUTPATIENT_CLINIC_OR_DEPARTMENT_OTHER): Payer: Medicare Other | Admitting: Physician Assistant

## 2023-04-20 DIAGNOSIS — I87311 Chronic venous hypertension (idiopathic) with ulcer of right lower extremity: Secondary | ICD-10-CM | POA: Diagnosis not present

## 2023-04-20 DIAGNOSIS — G8929 Other chronic pain: Secondary | ICD-10-CM | POA: Diagnosis not present

## 2023-04-20 DIAGNOSIS — L97811 Non-pressure chronic ulcer of other part of right lower leg limited to breakdown of skin: Secondary | ICD-10-CM | POA: Diagnosis not present

## 2023-04-20 DIAGNOSIS — S81811A Laceration without foreign body, right lower leg, initial encounter: Secondary | ICD-10-CM | POA: Diagnosis not present

## 2023-04-20 DIAGNOSIS — I10 Essential (primary) hypertension: Secondary | ICD-10-CM | POA: Diagnosis not present

## 2023-04-20 DIAGNOSIS — S80811A Abrasion, right lower leg, initial encounter: Secondary | ICD-10-CM | POA: Diagnosis not present

## 2023-04-21 ENCOUNTER — Ambulatory Visit: Payer: Medicare Other | Admitting: Sports Medicine

## 2023-04-26 ENCOUNTER — Other Ambulatory Visit: Payer: Medicare Other

## 2023-04-26 ENCOUNTER — Ambulatory Visit: Payer: Medicare Other | Admitting: Sports Medicine

## 2023-04-26 ENCOUNTER — Ambulatory Visit (INDEPENDENT_AMBULATORY_CARE_PROVIDER_SITE_OTHER): Payer: Medicare Other | Admitting: Sports Medicine

## 2023-04-26 VITALS — BP 138/62 | Ht 60.0 in | Wt 113.0 lb

## 2023-04-26 DIAGNOSIS — M5136 Other intervertebral disc degeneration, lumbar region: Secondary | ICD-10-CM | POA: Diagnosis not present

## 2023-04-26 NOTE — Progress Notes (Signed)
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 Jake Samples in neurosurgery tomorrow.  He operated on her cervical spine and may have some suggestions as to whether she would be a candidate for injection to her lumbar spine  Physical exam Elderly female in some discomfort but no acute distress BP 138/62   Ht 5' (1.524 m)   Wt 113 lb (51.3 kg)   BMI 22.07 kg/m  Patient walks with a limp favoring the right leg where she does not seem to want to push off fully Palpation reveals mild pain over the right SI joint but more pain over the buttocks the greater trochanter and the lateral leg on the right There is no muscle spasm along the paraspinous muscles in the lumbar spine Flexion causes an increase in pain and about 20 degrees Extension caused an increase in pain in about 10 degrees Left bend does not increase pain but right bend increases pain in the right leg Rotation is not painful  Her strength does not seem changed from her baseline

## 2023-04-26 NOTE — Patient Instructions (Signed)
Sifan  Sorry about your pain issues  You have significant sciatica and we know from Xrays and prior tests that you have severe degenerative disc and arthritis issues in your lumbar spine.  You have responded immediately to prednisone on 2 occasions with a lot of relief but we cannot keep using this.  My next option would be to start gabapentin but that is challenging with the other medications and your age.  Lets see what Dr. Jake Samples suggests.  If no other ideas I will get you back to start on gabapentin therapy.  You can continue the tylenol and tramadol which do help your pain.  Sterling Big, MD [phone]

## 2023-04-26 NOTE — Assessment & Plan Note (Signed)
Based on her current symptoms I am hoping that the neurosurgeon may have some suggestions that would be helpful She might be an injection candidate for her lumbar spine with an epidural but I will leave this for his discretion  I do think we may need to start a low-dose gabapentin and see if she can tolerate this in spite of her age and I will see her back in approximately 1 month to consider

## 2023-04-27 ENCOUNTER — Encounter (HOSPITAL_BASED_OUTPATIENT_CLINIC_OR_DEPARTMENT_OTHER): Payer: Medicare Other | Admitting: Physician Assistant

## 2023-04-27 DIAGNOSIS — L97811 Non-pressure chronic ulcer of other part of right lower leg limited to breakdown of skin: Secondary | ICD-10-CM | POA: Diagnosis not present

## 2023-04-27 DIAGNOSIS — S80811A Abrasion, right lower leg, initial encounter: Secondary | ICD-10-CM | POA: Diagnosis not present

## 2023-04-27 DIAGNOSIS — S81812A Laceration without foreign body, left lower leg, initial encounter: Secondary | ICD-10-CM | POA: Diagnosis not present

## 2023-04-27 DIAGNOSIS — S80812A Abrasion, left lower leg, initial encounter: Secondary | ICD-10-CM | POA: Diagnosis not present

## 2023-04-27 DIAGNOSIS — I87311 Chronic venous hypertension (idiopathic) with ulcer of right lower extremity: Secondary | ICD-10-CM | POA: Diagnosis not present

## 2023-04-27 DIAGNOSIS — M5416 Radiculopathy, lumbar region: Secondary | ICD-10-CM | POA: Diagnosis not present

## 2023-04-27 DIAGNOSIS — I10 Essential (primary) hypertension: Secondary | ICD-10-CM | POA: Diagnosis not present

## 2023-04-27 DIAGNOSIS — G8929 Other chronic pain: Secondary | ICD-10-CM | POA: Diagnosis not present

## 2023-05-04 ENCOUNTER — Encounter (HOSPITAL_BASED_OUTPATIENT_CLINIC_OR_DEPARTMENT_OTHER): Payer: Medicare Other | Attending: Physician Assistant | Admitting: Physician Assistant

## 2023-05-04 ENCOUNTER — Ambulatory Visit: Payer: Medicare Other | Admitting: Internal Medicine

## 2023-05-04 DIAGNOSIS — S80812A Abrasion, left lower leg, initial encounter: Secondary | ICD-10-CM | POA: Diagnosis not present

## 2023-05-04 DIAGNOSIS — I10 Essential (primary) hypertension: Secondary | ICD-10-CM | POA: Insufficient documentation

## 2023-05-04 DIAGNOSIS — I87311 Chronic venous hypertension (idiopathic) with ulcer of right lower extremity: Secondary | ICD-10-CM | POA: Insufficient documentation

## 2023-05-04 DIAGNOSIS — L97822 Non-pressure chronic ulcer of other part of left lower leg with fat layer exposed: Secondary | ICD-10-CM | POA: Insufficient documentation

## 2023-05-04 DIAGNOSIS — S81812A Laceration without foreign body, left lower leg, initial encounter: Secondary | ICD-10-CM | POA: Diagnosis not present

## 2023-05-04 DIAGNOSIS — L97811 Non-pressure chronic ulcer of other part of right lower leg limited to breakdown of skin: Secondary | ICD-10-CM | POA: Insufficient documentation

## 2023-05-05 ENCOUNTER — Ambulatory Visit (HOSPITAL_BASED_OUTPATIENT_CLINIC_OR_DEPARTMENT_OTHER): Payer: Medicare Other | Admitting: Physical Therapy

## 2023-05-05 DIAGNOSIS — M5416 Radiculopathy, lumbar region: Secondary | ICD-10-CM | POA: Diagnosis not present

## 2023-05-06 ENCOUNTER — Ambulatory Visit (HOSPITAL_BASED_OUTPATIENT_CLINIC_OR_DEPARTMENT_OTHER): Payer: Medicare Other | Admitting: Physical Therapy

## 2023-05-09 DIAGNOSIS — J4520 Mild intermittent asthma, uncomplicated: Secondary | ICD-10-CM | POA: Diagnosis not present

## 2023-05-09 DIAGNOSIS — R052 Subacute cough: Secondary | ICD-10-CM | POA: Diagnosis not present

## 2023-05-09 DIAGNOSIS — J3089 Other allergic rhinitis: Secondary | ICD-10-CM | POA: Diagnosis not present

## 2023-05-09 DIAGNOSIS — T63441D Toxic effect of venom of bees, accidental (unintentional), subsequent encounter: Secondary | ICD-10-CM | POA: Diagnosis not present

## 2023-05-11 ENCOUNTER — Encounter (HOSPITAL_BASED_OUTPATIENT_CLINIC_OR_DEPARTMENT_OTHER): Payer: Medicare Other | Admitting: Physician Assistant

## 2023-05-11 DIAGNOSIS — L97822 Non-pressure chronic ulcer of other part of left lower leg with fat layer exposed: Secondary | ICD-10-CM | POA: Diagnosis not present

## 2023-05-11 DIAGNOSIS — L97811 Non-pressure chronic ulcer of other part of right lower leg limited to breakdown of skin: Secondary | ICD-10-CM | POA: Diagnosis not present

## 2023-05-11 DIAGNOSIS — I10 Essential (primary) hypertension: Secondary | ICD-10-CM | POA: Diagnosis not present

## 2023-05-11 DIAGNOSIS — I87311 Chronic venous hypertension (idiopathic) with ulcer of right lower extremity: Secondary | ICD-10-CM | POA: Diagnosis not present

## 2023-05-11 DIAGNOSIS — S80812A Abrasion, left lower leg, initial encounter: Secondary | ICD-10-CM | POA: Diagnosis not present

## 2023-05-11 DIAGNOSIS — S81812A Laceration without foreign body, left lower leg, initial encounter: Secondary | ICD-10-CM | POA: Diagnosis not present

## 2023-05-12 NOTE — Progress Notes (Addendum)
[name] ([id number]) [id number] Page 1 of 8 Visit Report for 05/11/2023 Chief Complaint Document Details Patient Name: Date of Service: [name], [name] 05/11/2023 1:30 PM Medical Record Number: [id number] Patient Account Number: [id number] Date of Birth/Sex: Treating RN: Dec 31, 1935 (87 y.o. F) Primary Care Provider: [name] Other Clinician: Referring Provider: Treating Provider/Extender: [name], [name] in Treatment: 14 Information Obtained from: Patient Chief Complaint Bilateral LE Ulcers Electronic Signature(s) Signed: 05/11/2023 1:53:29 PM By: [name] PA-C Entered By: [name] on 05/11/2023 13:53:29 -------------------------------------------------------------------------------- Debridement Details Patient Name: Date of Service: [name] HEN, [name] 05/11/2023 1:30 PM Medical Record Number: [id number] Patient Account Number: [id number] Date of Birth/Sex: Treating RN: Sep 21, 1936 (87 y.o. Katrinka Blazing Primary Care Provider: [name] Other Clinician: Referring Provider: Treating Provider/Extender: [name] in Treatment: 14 Debridement Performed for Assessment: Wound #2 Left,Medial Lower Leg Performed By: Physician [name], PA Debridement Type: Debridement Level of Consciousness (Pre-procedure): Awake and Alert Pre-procedure Verification/Time Out Yes - 13:54 Taken: Start Time: 13:54 Pain Control: Lidocaine 4% T opical Solution Percent of Wound Bed Debrided: 100% T Area Debrided (cm): otal 0.41 Tissue and other material debrided: Viable, Non-Viable, Skin: Dermis , Skin: Epidermis, Biofilm Level: Skin/Epidermis Debridement Description: Selective/Open Wound Instrument: Curette Bleeding: Minimum Hemostasis Achieved: Pressure End Time: 13:55 Procedural Pain: 0 Post Procedural Pain: 0 Response to Treatment: Procedure was tolerated well Level of  Consciousness (Post- Awake and Alert procedure): Post Debridement Measurements of Total Wound Length: (cm) 1.3 Width: (cm) 0.4 Depth: (cm) 0.1 Volume: (cm) 0.041 Character of Wound/Ulcer Post Debridement: Improved Post Procedure Diagnosis Same as Pre-procedure Notes Scribed for [name] PA, by [employer] [name]) Signed: 05/11/2023 4:47:25 PM By: [name], [name] ([id number]) [id number] Page 2 of 8 Signed: 05/11/2023 5:42:48 PM By: [name] RN Entered By: [name] on 05/11/2023 13:58:35 -------------------------------------------------------------------------------- Debridement Details Patient Name: Date of Service: [name] HEN, [name] 05/11/2023 1:30 PM Medical Record Number: [id number] Patient Account Number: [id number] Date of Birth/Sex: Treating RN: [birth year] (87 y.o. Katrinka Blazing Primary Care Provider: [name] Other Clinician: Referring Provider: Treating Provider/Extender: [name] in Treatment: 14 Debridement Performed for Assessment: Wound #3 Left,Anterior Lower Leg Performed By: Physician [name], PA Debridement Type: Debridement Level of Consciousness (Pre-procedure): Awake and Alert Pre-procedure Verification/Time Out Yes - 13:54 Taken: Start Time: 13:54 Pain Control: Lidocaine 4% T opical Solution Percent of Wound Bed Debrided: 100% T Area Debrided (cm): otal 0.86 Tissue and other material debrided: Viable, Non-Viable, Skin: Dermis , Skin: Epidermis, Biofilm Level: Skin/Epidermis Debridement Description: Selective/Open Wound Instrument: Curette Bleeding: Minimum Hemostasis Achieved: Pressure End Time: 13:55 Procedural Pain: 0 Post Procedural Pain: 0 Response to Treatment: Procedure was tolerated well Level of Consciousness (Post- Awake and Alert procedure): Post Debridement Measurements of Total Wound Length: (cm) 1.1 Width:  (cm) 1 Depth: (cm) 0.1 Volume: (cm) 0.086 Character of Wound/Ulcer Post Debridement: Improved Post Procedure Diagnosis Same as Pre-procedure Notes Scribed for [name] PA, by [employer] [name]) Signed: 05/11/2023 4:47:25 PM By: [name] PA-C Signed: 05/11/2023 5:42:48 PM By: [name] RN Entered By: [name] on 05/11/2023 13:59:27 -------------------------------------------------------------------------------- HPI Details Patient Name: Date of Service: [name] HEN, [name] 05/11/2023 1:30 PM Medical Record Number: [id number] Patient Account Number: [id number] Date of Birth/Sex: Treating RN: 24-Aug-1936 (87 y.o. F) Primary Care Provider: [name] Other Clinician: Referring Provider: Treating Provider/Extender: Larina Bras  III, Pamila Mendibles Plotnikov, [name] in Treatment: 14 History of Present Illness HPI Description: ADMISSION 02/02/23 [name] is an 87 year old woman who lives in the independent [facility] of the [facility]. She has been dealing with a leg wound since February 19 when she traumatized her right leg on a cleaning bucket. She has been using Bactroban and T and an Ace wrap. PANYA, AFFELDT ([id number]) [id number] Page 3 of 8 Chronic low back pain, right hip pain, osteoporosis, hypertension, gait disease disorder she is not a diabetic. 02-16-2023 upon evaluation today patient appears to be doing excellent in regard to her wound although it does look a little bit dry overall I think we are still seeing some improvements here which is great news. Fortunately I do not see any evidence of active infection locally nor systemically which is great news. No fevers, chills, nausea, vomiting, or diarrhea. 02-23-2023 upon evaluation today patient appears to be doing well currently in regard to her wounds. She actually is doing excellent and in fact the more medial portion of the wound is almost completely  closed the lateral portion is much smaller I am very pleased with what we are seeing. 03-02-2023 upon evaluation today patient appears to be doing well currently in regard to her wound was actually showing signs of excellent improvement. Fortunately I do not see any evidence of active infection locally nor systemically which is great news and overall I am extremely pleased with where things stand. There is no sign of infection. No fevers, chills, nausea, vomiting, or diarrhea. 03-09-2023 upon evaluation today patient appears to be doing well currently in regard to her wound. This is actually showing signs of improvement I am actually very pleased with where we stand I think she is continuing to move in the right direction. She is very pleased and happy to hear this. 03-16-2023 upon evaluation today patient appears to be doing well with regard to her wounds visually and I do feel like she is making good progress here. Fortunately there does not appear to be any signs of active infection locally nor systemically which is great news. No fevers, chills, nausea, vomiting, or diarrhea. 03-30-2023 upon evaluation today patient appears to be doing well currently in regard to her wound. She is actually been tolerating the dressing changes without complication. Fortunately I do not see any signs of active infection locally nor systemically which is great news and I think that she is moving in the right direction. 04-06-2023 upon evaluation today patient actually appears to be doing great in regard to her wound she has been tolerating the dressing changes without complication the collagen seems to have done very well over the past week for her this is great news. 04-13-2023 upon evaluation today patient appears to be doing well currently in regard to her wound. She in fact does not show any signs of infection but unfortunately does not look quite as healed as what we even saw last week. I am a little concerned that the  dressing did not agree with her. We reason the collagen under the Xeroform I think it might have been just a little bit too wet. With that being said I am going to suggest based on what we are seeing that we go ahead and have the patient continue with the recommendation for a new dressing and will go ahead and make that switch they will be detailed in the plan. 04-20-2023 upon evaluation today patient appears to be doing well  currently in regard to her wound which actually is showing signs of not being completely healed but getting very close. Fortunately I do not see any evidence of active infection locally or systemically which is great news. 04-27-2023 upon evaluation today patient appears to be doing well currently in regard to her right leg which actually appears to be completely healed. In regard to the left leg unfortunately she has new wounds that are showing up here on this left side and this is due to initially having she feels like to get this on something and then subsequently after that she ended up having an area that pulled off with the Band-Aid she put on I told her she does not need to be using standard Band-Aids as her skin is much too fragile. She voiced understanding. I did give her options for the next care do a bandages which I think would be a much better option and are much safer for her. Obviously right now using the bordered foam for the areas that we are treating regularly to treat this left side as well. 05-04-2023 upon evaluation patient's right leg is completely healed left leg below doing a little bit better does look like it require some sharp debridement today to clearway the necrotic debris I discussed that with her at this point I do believe that we should be able to get this cleared up quite nicely. Fortunately I do not see any signs of active infection locally nor systemically which is great news. 05-11-2023 upon evaluation today patient appears to be doing well currently  in regard to her wounds on the left leg the right leg is already showing signs of being completely healed which is great news. Fortunately I do not see any signs of active infection locally or systemically which is great news. Electronic Signature(s) Signed: 05/11/2023 2:10:02 PM By: [name] PA-C Entered By: [name] on 05/11/2023 14:10:01 -------------------------------------------------------------------------------- Physical Exam Details Patient Name: Date of Service: [name] HEN, [name] 05/11/2023 1:30 PM Medical Record Number: [id number] Patient Account Number: [id number] Date of Birth/Sex: Treating RN: [birth year] (87 y.o. F) Primary Care Provider: [name] Other Clinician: Referring Provider: Treating Provider/Extender: [name], [name] in Treatment: 14 Constitutional Well-nourished and well-hydrated in no acute distress. Respiratory normal breathing without difficulty. Psychiatric this patient is able to make decisions and demonstrates good insight into disease process. Alert and Oriented x 3. pleasant and cooperative. Notes Upon inspection patient's wound bed actually showed signs of good granulation epithelization at this point. Fortunately I do not see any signs of worsening overall I think that the patient is making good progress and headway at this point. Electronic Signature(s) Signed: 05/11/2023 2:10:18 PM By: [name] PA-C Entered By: [name] on 05/11/2023 14:10:18 [name] ([id number]) [id number] Page 4 of 8 -------------------------------------------------------------------------------- Physician Orders Details Patient Name: Date of Service: [name] HEN, [name] DIA. 05/11/2023 1:30 PM Medical Record Number: [id number] Patient Account Number: [id number] Date of Birth/Sex: Treating RN: 10/12/1936 (87 y.o. Katrinka Blazing Primary Care Provider: [name] Other Clinician: Referring  Provider: Treating Provider/Extender: [name] in Treatment: 34 Verbal / Phone Orders: No Diagnosis Coding ICD-10 Coding Code Description L97.811 Non-pressure chronic ulcer of other part of right lower leg limited to breakdown of skin S80.811D Abrasion, right lower leg, subsequent encounter L97.822 Non-pressure chronic ulcer of other part of left lower leg with fat layer exposed S80.812A Abrasion, left lower leg, initial encounter I87.311  Chronic venous hypertension (idiopathic) with ulcer of right lower extremity Follow-up Appointments ppointment in 1 week. [facility] room 7 Wednesday Return A Other: - [facility] ask for silicone bordered foam dressings. and AandD Ointment **** Bathing/ Shower/ Hygiene May shower and wash wound with soap and water. Edema Control - Lymphedema / SCD / Other Bilateral Lower Extremities Elevate legs to the level of the heart or above for 30 minutes daily and/or when sitting for 3-4 times a day throughout the day. Avoid standing for long periods of time. Patient to wear own compression stockings every day. - pt to order from [facility]. Exercise regularly Non Wound Condition Right Lower Extremity Other Non Wound Condition Orders/Instructions: - Right Anterior Lower Leg can use Foam Border to help protect skin. Wound Treatment Wound #2 - Lower Leg Wound Laterality: Left, Medial Cleanser: Soap and Water 3 x Per Week/30 Days Discharge Instructions: May shower and wash wound with dial antibacterial soap and water prior to dressing change. Peri-Wound Care: Skin Prep (Generic) 3 x Per Week/30 Days Discharge Instructions: Use skin prep as directed Topical: AandD ointment 3 x Per Week/30 Days Discharge Instructions: small amount to wound bed before PolyMEM Prim Dressing: PolyMem Non-Adhesive Dressing, 4x4 in (Generic) 3 x Per Week/30 Days ary Discharge Instructions: Apply to wound bed as  instructed Secondary Dressing: Zetuvit Plus Silicone Border Dressing 4x4 (in/in) (Generic) 3 x Per Week/30 Days Discharge Instructions: Apply silicone border over primary dressing as directed. Wound #3 - Lower Leg Wound Laterality: Left, Anterior Cleanser: Soap and Water 3 x Per Week/30 Days Discharge Instructions: May shower and wash wound with dial antibacterial soap and water prior to dressing change. Peri-Wound Care: Skin Prep (Generic) 3 x Per Week/30 Days Discharge Instructions: Use skin prep as directed Topical: AandD ointment 3 x Per Week/30 Days Discharge Instructions: small amount to wound bed before PolyMEM Prim Dressing: PolyMem Non-Adhesive Dressing, 4x4 in (Generic) 3 x Per Week/30 Days ary Discharge Instructions: Apply to wound bed as instructed Secondary Dressing: Zetuvit Plus Silicone Border Dressing 4x4 (in/in) (Generic) 3 x Per Week/30 Days Discharge Instructions: Apply silicone border over primary dressing as directed. [name], [name] ([id number]) [id number] Page 5 of 8 Electronic Signature(s) Signed: 05/11/2023 4:47:25 PM By: [name] PA-C Signed: 05/11/2023 5:42:48 PM By: [name] RN Entered By: [name] on 05/11/2023 14:00:03 -------------------------------------------------------------------------------- Problem List Details Patient Name: Date of Service: [name] HEN, [name] 05/11/2023 1:30 PM Medical Record Number: [id number] Patient Account Number: [id number] Date of Birth/Sex: Treating RN: [birth year] (87 y.o. 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 02/02/2023 No Yes of skin S80.811D Abrasion, right lower leg, subsequent  encounter 02/02/2023 No Yes L97.822 Non-pressure chronic ulcer of other part of left lower leg with fat layer exposed5/29/2024 No Yes S80.812A Abrasion, left lower leg, initial encounter 04/27/2023 No Yes I87.311 Chronic venous hypertension (idiopathic) with ulcer of right lower extremity 02/02/2023 No Yes Inactive Problems Resolved Problems Electronic Signature(s) Signed: 05/11/2023 1:53:18 PM By: [name] PA-C Entered By: [name] on 05/11/2023 13:53:18 -------------------------------------------------------------------------------- Progress Note Details Patient Name: Date of Service: [name] HEN, [name] 05/11/2023 1:30 PM Medical Record Number: [id number] Patient Account Number: [id number] Date of Birth/Sex: Treating RN: Nov 11, 1936 (87 y.o. F) Primary Care Provider: [name]  Other Clinician: Referring Provider: Treating Provider/Extender: [name], [name] in Treatment: 14 Subjective Chief Complaint Information obtained from Patient Bilateral LE Ulcers History of Present Illness (HPI) [name], [name] ([id number]) [id number] Page 6 of 8 ADMISSION 02/02/23 Mrs. Oni is an 87 year old woman who lives in the independent part of the [facility]. She has been dealing with a leg wound since February 19 when she traumatized her right leg on a cleaning bucket. She has been using Bactroban and T and an Ace wrap. elfa Chronic low back pain, right hip pain, osteoporosis, hypertension, gait disease disorder she is not a diabetic. 02-16-2023 upon evaluation today patient appears to be doing excellent in regard to her wound although it does look a little bit dry overall I think we are still seeing some improvements here which is great news. Fortunately I do not see any evidence of active infection locally nor systemically which is great news. No fevers, chills, nausea, vomiting, or diarrhea. 02-23-2023 upon evaluation  today patient appears to be doing well currently in regard to her wounds. She actually is doing excellent and in fact the more medial portion of the wound is almost completely closed the lateral portion is much smaller I am very pleased with what we are seeing. 03-02-2023 upon evaluation today patient appears to be doing well currently in regard to her wound was actually showing signs of excellent improvement. Fortunately I do not see any evidence of active infection locally nor systemically which is great news and overall I am extremely pleased with where things stand. There is no sign of infection. No fevers, chills, nausea, vomiting, or diarrhea. 03-09-2023 upon evaluation today patient appears to be doing well currently in regard to her wound. This is actually showing signs of improvement I am actually very pleased with where we stand I think she is continuing to move in the right direction. She is very pleased and happy to hear this. 03-16-2023 upon evaluation today patient appears to be doing well with regard to her wounds visually and I do feel like she is making good progress here. Fortunately there does not appear to be any signs of active infection locally nor systemically which is great news. No fevers, chills, nausea, vomiting, or diarrhea. 03-30-2023 upon evaluation today patient appears to be doing well currently in regard to her wound. She is actually been tolerating the dressing changes without complication. Fortunately I do not see any signs of active infection locally nor systemically which is great news and I think that she is moving in the right direction. 04-06-2023 upon evaluation today patient actually appears to be doing great in regard to her wound she has been tolerating the dressing changes without complication the collagen seems to have done very well over the past week for her this is great news. 04-13-2023 upon evaluation today patient appears to be doing well currently in regard  to her wound. She in fact does not show any signs of infection but unfortunately does not look quite as healed as what we even saw last week. I am a little concerned that the dressing did not agree with her. We reason the collagen under the Xeroform I think it might have been just a little bit too wet. With that being said I am going to suggest based on what we are seeing that we go ahead and have the patient continue with the recommendation for a new dressing and will go ahead and make that switch they  will be detailed in the plan. 04-20-2023 upon evaluation today patient appears to be doing well currently in regard to her wound which actually is showing signs of not being completely healed but getting very close. Fortunately I do not see any evidence of active infection locally or systemically which is great news. 04-27-2023 upon evaluation today patient appears to be doing well currently in regard to her right leg which actually appears to be completely healed. In regard to the left leg unfortunately she has new wounds that are showing up here on this left side and this is due to initially having she feels like to get this on something and then subsequently after that she ended up having an area that pulled off with the Band-Aid she put on I told her she does not need to be using standard Band-Aids as her skin is much too fragile. She voiced understanding. I did give her options for the next care do a bandages which I think would be a much better option and are much safer for her. Obviously right now using the bordered foam for the areas that we are treating regularly to treat this left side as well. 05-04-2023 upon evaluation patient's right leg is completely healed left leg below doing a little bit better does look like it require some sharp debridement today to clearway the necrotic debris I discussed that with her at this point I do believe that we should be able to get this cleared up quite nicely.  Fortunately I do not see any signs of active infection locally nor systemically which is great news. 05-11-2023 upon evaluation today patient appears to be doing well currently in regard to her wounds on the left leg the right leg is already showing signs of being completely healed which is great news. Fortunately I do not see any signs of active infection locally or systemically which is great news. Objective Constitutional Well-nourished and well-hydrated in no acute distress. Vitals Time Taken: 1:33 PM, Height: 61 in, Weight: 113 lbs, BMI: 21.3, Temperature: 97.9 F, Pulse: 83 bpm, Respiratory Rate: 16 breaths/min, Blood Pressure: 147/79 mmHg. Respiratory normal breathing without difficulty. Psychiatric this patient is able to make decisions and demonstrates good insight into disease process. Alert and Oriented x 3. pleasant and cooperative. General Notes: Upon inspection patient's wound bed actually showed signs of good granulation epithelization at this point. Fortunately I do not see any signs of worsening overall I think that the patient is making good progress and headway at this point. Integumentary (Hair, Skin) Wound #2 status is Open. Original cause of wound was Trauma. The date acquired was: 04/21/2023. The wound has been in treatment 2 weeks. The wound is located on the Left,Medial Lower Leg. The wound measures 1.3cm length x 0.4cm width x 0.1cm depth; 0.408cm^2 area and 0.041cm^3 volume. There is Fat Layer (Subcutaneous Tissue) exposed. There is no tunneling or undermining noted. There is a medium amount of serosanguineous drainage noted. The wound margin is distinct with the outline attached to the wound base. There is medium (34-66%) red, pink granulation within the wound bed. There is a medium (34-66%) amount of necrotic tissue within the wound bed including Eschar and Adherent Slough. The periwound skin appearance exhibited: Scarring, Hemosiderin Staining. The periwound skin  appearance did not exhibit: Callus, Crepitus, Excoriation, Induration, Rash, Dry/Scaly, Maceration, Atrophie Blanche, Cyanosis, Ecchymosis, Mottled, Pallor, Rubor, Erythema. Periwound temperature was noted as No Abnormality. Wound #3 status is Open. Original cause of wound was Trauma. The date  acquired was: 04/21/2023. The wound has been in treatment 2 weeks. The wound is KIELA, GOOKIN ([id number]) [id number] Page 7 of 8 located on the Left,Anterior Lower Leg. The wound measures 1.1cm length x 1cm width x 0.1cm depth; 0.864cm^2 area and 0.086cm^3 volume. There is Fat Layer (Subcutaneous Tissue) exposed. There is no tunneling or undermining noted. There is a medium amount of serosanguineous drainage noted. The wound margin is distinct with the outline attached to the wound base. There is medium (34-66%) red granulation within the wound bed. There is a medium (34-66%) amount of necrotic tissue within the wound bed including Adherent Slough. The periwound skin appearance exhibited: Scarring, Hemosiderin Staining. The periwound skin appearance did not exhibit: Callus, Crepitus, Excoriation, Induration, Rash, Dry/Scaly, Maceration, Atrophie Blanche, Cyanosis, Ecchymosis, Mottled, Pallor, Rubor, Erythema. Assessment Active Problems ICD-10 Non-pressure chronic ulcer of other part of right lower leg limited to breakdown of skin Abrasion, right lower leg, subsequent encounter Non-pressure chronic ulcer of other part of left lower leg with fat layer exposed Abrasion, left lower leg, initial encounter Chronic venous hypertension (idiopathic) with ulcer of right lower extremity Procedures Wound #2 Pre-procedure diagnosis of Wound #2 is a Skin T located on the Left,Medial Lower Leg . There was a Selective/Open Wound Skin/Epidermis Debridement ear with a total area of 0.41 sq cm performed by [name], PA. With the following instrument(s): Curette to remove Viable and  Non-Viable tissue/material. Material removed includes Skin: Dermis, Skin: Epidermis, and Biofilm after achieving pain control using Lidocaine 4% T opical Solution. No specimens were taken. A time out was conducted at 13:54, prior to the start of the procedure. A Minimum amount of bleeding was controlled with Pressure. The procedure was tolerated well with a pain level of 0 throughout and a pain level of 0 following the procedure. Post Debridement Measurements: 1.3cm length x 0.4cm width x 0.1cm depth; 0.041cm^3 volume. Character of Wound/Ulcer Post Debridement is improved. Post procedure Diagnosis Wound #2: Same as Pre-Procedure General Notes: Scribed for [name] PA, by J.Scotton. Wound #3 Pre-procedure diagnosis of Wound #3 is an Abrasion located on the Left,Anterior Lower Leg . There was a Selective/Open Wound Skin/Epidermis Debridement with a total area of 0.86 sq cm performed by [name], PA. With the following instrument(s): Curette to remove Viable and Non-Viable tissue/material. Material removed includes Skin: Dermis, Skin: Epidermis, and Biofilm after achieving pain control using Lidocaine 4% T opical Solution. No specimens were taken. A time out was conducted at 13:54, prior to the start of the procedure. A Minimum amount of bleeding was controlled with Pressure. The procedure was tolerated well with a pain level of 0 throughout and a pain level of 0 following the procedure. Post Debridement Measurements: 1.1cm length x 1cm width x 0.1cm depth; 0.086cm^3 volume. Character of Wound/Ulcer Post Debridement is improved. Post procedure Diagnosis Wound #3: Same as Pre-Procedure General Notes: Scribed for [name] PA, by J.Scotton. Plan Follow-up Appointments: Return Appointment in 1 week. [facility] room 7 Wednesday Other: - [facility] ***[facility] ask for silicone bordered foam dressings. and AandD Ointment **** Bathing/ Shower/ Hygiene: May shower and wash wound with soap  and water. Edema Control - Lymphedema / SCD / Other: Elevate legs to the level of the heart or above for 30 minutes daily and/or when sitting for 3-4 times a day throughout the day. Avoid standing for long periods of time. Patient to wear own compression stockings every day. - pt to order from [employer]. Exercise  regularly Non Wound Condition: Other Non Wound Condition Orders/Instructions: - Right Anterior Lower Leg can use Foam Border to help protect skin. WOUND #2: - Lower Leg Wound Laterality: Left, Medial Cleanser: Soap and Water 3 x Per Week/30 Days Discharge Instructions: May shower and wash wound with dial antibacterial soap and water prior to dressing change. Peri-Wound Care: Skin Prep (Generic) 3 x Per Week/30 Days Discharge Instructions: Use skin prep as directed Topical: AandD ointment 3 x Per Week/30 Days Discharge Instructions: small amount to wound bed before PolyMEM Prim Dressing: PolyMem Non-Adhesive Dressing, 4x4 in (Generic) 3 x Per Week/30 Days ary Discharge Instructions: Apply to wound bed as instructed Secondary Dressing: Zetuvit Plus Silicone Border Dressing 4x4 (in/in) (Generic) 3 x Per Week/30 Days Discharge Instructions: Apply silicone border over primary dressing as directed. WOUND #3: - Lower Leg Wound Laterality: Left, Anterior Cleanser: Soap and Water 3 x Per Week/30 Days Discharge Instructions: May shower and wash wound with dial antibacterial soap and water prior to dressing change. Peri-Wound Care: Skin Prep (Generic) 3 x Per Week/30 Days Discharge Instructions: Use skin prep as directed Topical: AandD ointment 3 x Per Week/30 Days Discharge Instructions: small amount to wound bed before PolyMEM Prim Dressing: PolyMem Non-Adhesive Dressing, 4x4 in (Generic) 3 x Per Week/30 Days ary Discharge Instructions: Apply to wound bed as instructed [name], [name] ([id number]) [id number] Page 8 of 8 Secondary Dressing: Zetuvit  Plus Silicone Border Dressing 4x4 (in/in) (Generic) 3 x Per Week/30 Days Discharge Instructions: Apply silicone border over primary dressing as directed. 1. I am good recommend that we have the patient continue to monitor for any evidence of infection or worsening. Based on what I am seeing I do believe that we are really making excellent progress towards complete closure and I think that she is doing great the only change I would make with the PolyMem as I would like to go ahead and see about adding a little AandD ointment I do need to keep it from sticking. 2. I am going to recommend as well the patient should continue to monitor for any signs of infection or worsening. Based on what I am seeing I do believe that we are making really good progress towards closure here. 3. We will continue with the Zetuvit border foam over top of the PolyMem. We will see patient back for reevaluation in 1 week here in the clinic. If anything worsens or changes patient will contact our office for additional recommendations. Electronic Signature(s) Signed: 05/11/2023 2:11:05 PM By: [name] PA-C Entered By: [name] on 05/11/2023 14:11:04 -------------------------------------------------------------------------------- SuperBill Details Patient Name: Date of Service: [name] HEN, Cenia H. 05/11/2023 Medical Record Number: [id number] Patient Account Number: [id number] Date of Birth/Sex: Treating RN: 09/24/1936 (87 y.o. F) Primary Care Provider: [name] Other Clinician: Referring Provider: Treating Provider/Extender: [name] in Treatment: 14 Diagnosis Coding ICD-10 Codes Code Description [phone] Non-pressure chronic ulcer of other part of right lower leg limited to breakdown of skin S80.811D Abrasion, right lower leg, subsequent encounter L97.822 Non-pressure chronic ulcer of other part of left lower leg with fat layer exposed S80.812A Abrasion, left lower leg,  initial encounter I87.311 Chronic venous hypertension (idiopathic) with ulcer of right lower extremity Facility Procedures : CPT4 Code: [id number] Description: 97597 - DEBRIDE WOUND 1ST 20 SQ CM OR < ICD-10 Diagnosis Description L97.822 Non-pressure chronic ulcer of other part of left lower leg with fat layer exposed Modifier: Quantity: 1 Physician Procedures : CPT4 Code Description  Modifier [id number] - WC PHYS DEBR WO ANESTH 20 SQ CM ICD-10 Diagnosis Description L97.822 Non-pressure chronic ulcer of other part of left lower leg with fat layer exposed Quantity: 1 Electronic Signature(s) Signed: 05/11/2023 2:21:21 PM By: [name] PA-C Entered By: [name] on 05/11/2023 14:21:20

## 2023-05-13 NOTE — Progress Notes (Signed)
SAMA, CIALLELLA ([id number]) [id number] Page 1 of 6 Visit Report for 05/11/2023 Arrival Information Details Patient Name: Date of Service: [name], [name] 05/11/2023 1:30 PM Medical Record Number: [id number] Patient Account Number: [id number] Date of Birth/Sex: Treating RN: [birth year] (87 y.o. F) Primary Care Kole Hilyard: [name] Other Clinician: Referring Catalia Massett: Treating Reather Steller/Extender: [name] in Treatment: 14 Visit Information History Since Last Visit Added or deleted any medications: No Patient Arrived: Cane Any new allergies or adverse reactions: No Arrival Time: 13:33 Had a fall or experienced change in No Accompanied By: self activities of daily living that may affect Transfer Assistance: None risk of falls: Patient Identification Verified: Yes Signs or symptoms of abuse/neglect since last visito No Secondary Verification Process Completed: Yes Hospitalized since last visit: No Patient Requires Transmission-Based Precautions: No Implantable device outside of the clinic excluding No Patient Has Alerts: No cellular tissue based products placed in the center since last visit: Has Dressing in Place as Prescribed: Yes Pain Present Now: No Electronic Signature(s) Signed: 05/11/2023 4:21:40 PM By: Thayer Dallas Entered By: Thayer Dallas on 05/11/2023 13:33:52 -------------------------------------------------------------------------------- Encounter Discharge Information Details Patient Name: Date of Service: [name], [name] 05/11/2023 1:30 PM Medical Record Number: [id number] Patient Account Number: [id number] Date of Birth/Sex: Treating RN: 03/12/1936 (87 y.o. Katrinka Blazing Primary Care Peytin Dechert: [name] Other Clinician: Referring Corinne Goucher: Treating Kirat Mezquita/Extender: [name] in Treatment: 14 Encounter Discharge Information Items Post Procedure  Vitals Discharge Condition: Stable Temperature (F): 97.9 Ambulatory Status: Cane Pulse (bpm): 83 Discharge Destination: Home Respiratory Rate (breaths/min): 16 Transportation: Private Auto Blood Pressure (mmHg): 147/79 Accompanied By: self Schedule Follow-up Appointment: Yes Clinical Summary of Care: Patient Declined Electronic Signature(s) Signed: 05/11/2023 5:42:48 PM By: [name] RN Entered By: [name] on 05/11/2023 17:19:44 -------------------------------------------------------------------------------- Lower Extremity Assessment Details Patient Name: Date of Service: [name], [name] 05/11/2023 1:30 PM Medical Record Number: [id number] Patient Account Number: [id number] Date of Birth/Sex: Treating RN: Nov 02, 1936 (87 y.o. F) Primary Care Kosei Rhodes: [name] Other Clinician: Referring Jabes Primo: Treating Leith Szafranski/Extender: [name], Georgina Quint Weeks in Treatment: 14 Edema Assessment Assessed: [Left: No] [Right: No] C[LeftESTEBAN, SALERA ([id number])] [[id number]: [id number] Page 2 of 6] Edema: [Left: N] [Right: o] Calf Left: Right: Point of Measurement: 28 cm From Medial Instep 33 cm Ankle Left: Right: Point of Measurement: 10 cm From Medial Instep 20 cm Electronic Signature(s) Signed: 05/11/2023 4:21:40 PM By: Thayer Dallas Entered By: Thayer Dallas on 05/11/2023 13:37:58 -------------------------------------------------------------------------------- Multi-Disciplinary Care Plan Details Patient Name: Date of Service: [name], [name] 05/11/2023 1:30 PM Medical Record Number: [id number] Patient Account Number: [id number] Date of Birth/Sex: Treating RN: 05-26-1936 (87 y.o. Katrinka Blazing Primary Care Ruth Tully: [name] Other Clinician: Referring Aryanna Shaver: Treating Nahjae Hoeg/Extender: [name] in Treatment: 14 Active Inactive Wound/Skin Impairment Nursing  Diagnoses: Impaired tissue integrity Knowledge deficit related to ulceration/compromised skin integrity Goals: Patient/caregiver will verbalize understanding of skin care regimen Date Initiated: 02/02/2023 Target Resolution Date: 07/30/2023 Goal Status: Active Ulcer/skin breakdown will have a volume reduction of 30% by week 4 Date Initiated: 02/02/2023 Date Inactivated: 03/02/2023 Target Resolution Date: 03/02/2023 Goal Status: Met Interventions: Assess patient/caregiver ability to obtain necessary supplies Assess patient/caregiver ability to perform ulcer/skin care regimen upon admission and as needed Assess ulceration(s) every visit Provide education on ulcer and skin care Notes: Electronic Signature(s) Signed: 05/11/2023 5:42:48 PM By: [name] RN Entered  By: [name] on 05/11/2023 17:13:36 -------------------------------------------------------------------------------- Pain Assessment Details Patient Name: Date of Service: [name], [name] SWAYZE. 05/11/2023 1:30 PM Medical Record Number: [id number] Patient Account Number: [id number] Date of Birth/Sex: Treating RN: 01/10/1936 (87 y.o. F) Primary Care Russell Engelstad: [name] Other Clinician: Referring Lovinia Snare: Treating Eliceo Gladu/Extender: [name] in Treatment: 14 Active Problems Location of Pain Severity and Description of Pain Patient Has Paino No Site Locations [place], [place] ([id number]) [id number] Page 3 of 6 Pain Management and Medication Current Pain Management: Electronic Signature(s) Signed: 05/11/2023 4:21:40 PM By: Thayer Dallas Entered By: Thayer Dallas on 05/11/2023 13:35:41 -------------------------------------------------------------------------------- Patient/Caregiver Education Details Patient Name: Date of Service: [name], [name] 6/12/2024andnbsp1:30 PM Medical Record Number: [id number] Patient Account Number: [id number] Date of  Birth/Gender: Treating RN: 1936-08-17 (87 y.o. Katrinka Blazing Primary Care Physician: [name] Other Clinician: Referring Physician: Treating Physician/Extender: [name], [name] in Treatment: 14 Education Assessment Education Provided To: Patient Education Topics Provided Wound/Skin Impairment: Methods: Explain/Verbal Responses: Return demonstration correctly Electronic Signature(s) Signed: 05/11/2023 5:42:48 PM By: [name] RN Entered By: [name] on 05/11/2023 17:16:17 -------------------------------------------------------------------------------- Wound Assessment Details Patient Name: Date of Service: [name], [name] 05/11/2023 1:30 PM Medical Record Number: [id number] Patient Account Number: [id number] Date of Birth/Sex: Treating RN: [birth year] (87 y.o. Katrinka Blazing Primary Care Daniele Dillow: [name] Other Clinician: Referring Maniah Nading: Treating Tramane Gorum/Extender: [name] in Treatment: 14 Wound Status Wound Number: 2 Primary Etiology: Skin Tear Wound Location: Left, Medial Lower Leg Wound Status: Open Wounding Event: Trauma Comorbid Cataracts, Asthma, Congestive Heart Failure, History: Osteoarthritis Date Acquired: 04/21/2023 Weeks Of Treatment: 2 Clustered Wound: Yes [name], [name] ([id number]) [id number] Page 4 of 6 Photos Wound Measurements Length: (cm) Width: (cm) Depth: (cm) Clustered Quantity: Area: (cm) Volume: (cm) 1.3 % Reduction in Area: 81.4% 0.4 % Reduction in Volume: 81.4% 0.1 Epithelialization: Small (1-33%) 2 Tunneling: No 0.408 Undermining: No 0.041 Wound Description Classification: Full Thickness Without Exposed Sup Wound Margin: Distinct, outline attached Exudate Amount: Medium Exudate Type: Serosanguineous Exudate Color: red, brown port Structures Foul Odor After Cleansing: No Slough/Fibrino Yes Wound  Bed Granulation Amount: Medium (34-66%) Exposed Structure Granulation Quality: Red, Pink Fascia Exposed: No Necrotic Amount: Medium (34-66%) Fat Layer (Subcutaneous Tissue) Exposed: Yes Necrotic Quality: Eschar, Adherent Slough Tendon Exposed: No Muscle Exposed: No Joint Exposed: No Bone Exposed: No Periwound Skin Texture Texture Color No Abnormalities Noted: No No Abnormalities Noted: No Callus: No Atrophie Blanche: No Crepitus: No Cyanosis: No Excoriation: No Ecchymosis: No Induration: No Erythema: No Rash: No Hemosiderin Staining: Yes Scarring: Yes Mottled: No Pallor: No Moisture Rubor: No No Abnormalities Noted: No Dry / Scaly: No Temperature / Pain Maceration: No Temperature: No Abnormality Treatment Notes Wound #2 (Lower Leg) Wound Laterality: Left, Medial Cleanser Soap and Water Discharge Instruction: May shower and wash wound with dial antibacterial soap and water prior to dressing change. Peri-Wound Care Skin Prep Discharge Instruction: Use skin prep as directed Topical AandD ointment Discharge Instruction: small amount to wound bed before PolyMEM Primary Dressing PolyMem Non-Adhesive Dressing, 4x4 in Discharge Instruction: Apply to wound bed as instructed MASEY, KOWALICK ([id number]) [id number] Page 5 of 6 Secondary Dressing Zetuvit Plus Silicone Border Dressing 4x4 (in/in) Discharge Instruction: Apply silicone border over primary dressing as directed. Secured With Compression Wrap Compression Stockings Facilities manager) Signed: 05/11/2023 5:42:48 PM By: [name] RN Entered By: [name] on 05/11/2023 13:48:19 --------------------------------------------------------------------------------  Wound Assessment Details Patient Name: Date of Service: [name], [name] 05/11/2023 1:30 PM Medical Record Number: [id number] Patient Account Number: [id number] Date of Birth/Sex: Treating RN: 09-Jun-1936 (87  y.o. F) Primary Care Amiera Herzberg: [name] Other Clinician: Referring Keanon Bevins: Treating Lamiya Naas/Extender: [name], [name] in Treatment: 14 Wound Status Wound Number: 3 Primary Etiology: Abrasion Wound Location: Left, Anterior Lower Leg Wound Status: Open Wounding Event: Trauma Comorbid Cataracts, Asthma, Congestive Heart Failure, History: Osteoarthritis Date Acquired: 04/21/2023 Weeks Of Treatment: 2 Clustered Wound: No Photos Wound Measurements Length: (cm) 1.1 Width: (cm) 1 Depth: (cm) 0.1 Area: (cm) 0.864 Volume: (cm) 0.086 % Reduction in Area: 43.6% % Reduction in Volume: 43.8% Epithelialization: Small (1-33%) Tunneling: No Undermining: No Wound Description Classification: Full Thickness Without Exposed Support Structures Wound Margin: Distinct, outline attached Exudate Amount: Medium Exudate Type: Serosanguineous Exudate Color: red, brown Foul Odor After Cleansing: No Slough/Fibrino Yes Wound Bed Granulation Amount: Medium (34-66%) Exposed Structure Granulation Quality: Red Fascia Exposed: No Necrotic Amount: Medium (34-66%) Fat Layer (Subcutaneous Tissue) Exposed: Yes Necrotic Quality: Adherent Slough Tendon Exposed: No Muscle Exposed: No Joint Exposed: No Bone Exposed: No [address] [name], [name] ([id number]) [id number] Page 6 of 6 Texture Color No Abnormalities Noted: No No Abnormalities Noted: No Callus: No Atrophie Blanche: No Crepitus: No Cyanosis: No Excoriation: No Ecchymosis: No Induration: No Erythema: No Rash: No Hemosiderin Staining: Yes Scarring: Yes Mottled: No Pallor: No Moisture Rubor: No No Abnormalities Noted: No Dry / Scaly: No Maceration: No Treatment Notes Wound #3 (Lower Leg) Wound Laterality: Left, Anterior Cleanser Soap and Water Discharge Instruction: May shower and wash wound with dial antibacterial soap and water prior to dressing  change. Peri-Wound Care Skin Prep Discharge Instruction: Use skin prep as directed Topical AandD ointment Discharge Instruction: small amount to wound bed before PolyMEM Primary Dressing PolyMem Non-Adhesive Dressing, 4x4 in Discharge Instruction: Apply to wound bed as instructed Secondary Dressing Zetuvit Plus Silicone Border Dressing 4x4 (in/in) Discharge Instruction: Apply silicone border over primary dressing as directed. Secured With Compression Wrap Compression Stockings Facilities manager) Signed: 05/11/2023 5:42:48 PM By: [name] RN Entered By: [name] on 05/11/2023 13:49:22 -------------------------------------------------------------------------------- Vitals Details Patient Name: Date of Service: [name], [name] 05/11/2023 1:30 PM Medical Record Number: [id number] Patient Account Number: [id number] Date of Birth/Sex: Treating RN: [birth year] (87 y.o. F) Primary Care Massiah Minjares: [name] Other Clinician: Referring Kynzlie Hilleary: Treating Arionne Iams/Extender: [name] in Treatment: 14 Vital Signs Time Taken: 13:33 Temperature (F): 97.9 Height (in): 61 Pulse (bpm): 83 Weight (lbs): 113 Respiratory Rate (breaths/min): 16 Body Mass Index (BMI): 21.3 Blood Pressure (mmHg): 147/79 Reference Range: 80 - 120 mg / dl Electronic Signature(s) Signed: 05/11/2023 4:21:40 PM By: Thayer Dallas Entered By: Thayer Dallas on 05/11/2023 13:35:34

## 2023-05-16 ENCOUNTER — Other Ambulatory Visit: Payer: Self-pay

## 2023-05-16 ENCOUNTER — Encounter (HOSPITAL_BASED_OUTPATIENT_CLINIC_OR_DEPARTMENT_OTHER): Payer: Self-pay | Admitting: Physical Therapy

## 2023-05-16 ENCOUNTER — Ambulatory Visit (HOSPITAL_BASED_OUTPATIENT_CLINIC_OR_DEPARTMENT_OTHER): Payer: Medicare Other | Attending: Sports Medicine | Admitting: Physical Therapy

## 2023-05-16 DIAGNOSIS — M25551 Pain in right hip: Secondary | ICD-10-CM | POA: Diagnosis not present

## 2023-05-16 NOTE — Therapy (Signed)
OUTPATIENT PHYSICAL THERAPY EVALUATION   Patient Name: [name] MRN: [id number] DOB:[birth year], 87 y.o., female Today's Date: 05/16/2023  END OF SESSION:  PT End of Session - 05/16/23 1353     Visit Number 1    Number of Visits 9    Date for PT Re-Evaluation 07/16/23    PT Start Time 1350    PT Stop Time 1430    PT Time Calculation (min) 40 min    Activity Tolerance Patient tolerated treatment well    Behavior During Therapy WFL for tasks assessed/performed             Past Medical History:  Diagnosis Date   Allergic rhinitis    Asthma    Atrophic vaginitis    Baker's cyst    Left-Dr. Aluisio   Cataract    Dr. Dione Booze   CHF (congestive heart failure) (HCC)    Colon polyps    Fibroid    Gastritis    chronic   GERD (gastroesophageal reflux disease)    Heart murmur    Hemorrhoids    Hyperlipidemia    Hypothyroidism    IBS (irritable bowel syndrome)    Lumbar spondylosis    MVP (mitral valve prolapse)    Antibiotics required for dental procedures   OA (osteoarthritis)    Osteoporosis 06/2018   T score -2.2 stable on Prolia   Overactive bladder    Sacroiliitis (HCC)    Thyroid disease    Thyroid nodule    Torn meniscus    bilateral   Past Surgical History:  Procedure Laterality Date   CATARACT EXTRACTION, BILATERAL     EYE SURGERY     POSTERIOR CERVICAL FUSION/FORAMINOTOMY N/A 05/15/2021   Procedure: Cervical One Laminectomy with resection of cyst, Fixation from Occiput to Cervical Four;  Surgeon: [name], MD;  Location: [facility] OR;  Service: Neurosurgery;  Laterality: N/A;   TONSILLECTOMY     Patient Active Problem List   Diagnosis Date Noted   Chalazion left upper eyelid 04/18/2023   Bruising 04/06/2023   Leg wound, right, initial encounter 01/17/2023   Pain of right sacroiliac joint 01/14/2023   Lumbar degenerative disc disease 12/21/2022   Chronic left SI joint pain 11/30/2022   Inflammation of sacroiliac joint (HCC) 11/16/2022   Stool  incontinence 11/16/2022   Memory impairment 11/08/2022   Grief 10/06/2022   History of basal cell carcinoma 09/29/2022   Open forehead wound 07/21/2022   Stress at home 07/21/2022   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   Gait disorder 02/28/2022   History of malignant neoplasm of skin 02/22/2022   Melanocytic nevi of trunk 02/22/2022   Seborrheic dermatitis 02/22/2022   Urticaria 02/22/2022   Irregular bowel habits 11/18/2021   Scalp laceration, sequela 11/02/2021   Concussion 10/29/2021   Scalp itch 08/19/2021   Allergic rhinitis due to animal (cat) (dog) hair and dander 08/07/2021   Allergic rhinitis due to pollen 08/07/2021   Mild intermittent asthma 08/07/2021   Closed fracture of first cervical vertebra (HCC) 08/07/2021   Closed fracture of second cervical vertebra (HCC) 08/07/2021   Cervical spine instability 05/15/2021   Closed 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 05/13/2021   Cervical spinal cord compression (HCC) 05/04/2021   Fall against object 03/25/2021   Hematoma of face, initial encounter 03/25/2021   Degenerative disc disease, cervical 03/11/2021   Spondylolisthesis, cervical region 03/11/2021   Trochanteric bursitis of right hip 09/22/2020   Elevated blood-pressure reading, without diagnosis of hypertension 07/14/2020  Meningioma (HCC) 03/25/2020   Arthralgia 03/18/2020   RUQ pain 01/02/2020   Cholelithiasis 09/09/2019   Ear pain, bilateral 03/21/2019   Cervical spondylosis 01/05/2019   Neck pain 12/26/2018   Acute pain of left shoulder 12/26/2018   Anxiety 08/22/2018   Contact dermatitis and eczema 06/14/2018   Constipation 11/11/2016   Low back pain 11/10/2016   Leg abrasion 08/12/2016   Contusion of right knee 08/12/2016   Sinusitis, chronic 08/06/2016   Rash and nonspecific skin eruption 04/20/2016   Aortic valve sclerosis 03/12/2015   Contusion of left chest wall 01/09/2015   Fatigue 09/08/2014   Bladder pain 08/19/2014    Increased frequency of urination 08/19/2014   Urinary urgency 08/19/2014   Postherpetic neuralgia 10/02/2013   Impacted cerumen of right ear 07/16/2013   Headache 07/10/2013   Aortic stenosis 04/19/2013   Insomnia 01/24/2013   Cough 08/07/2012   Upper respiratory infection 08/03/2012   Pruritus of skin 03/11/2012   MVP (mitral valve prolapse)    Multiple thyroid nodules 10/04/2011   Herpes zoster 09/07/2011   Essential hypertension, benign 07/27/2011   Atrophic vaginitis    Well adult exam 03/15/2011   Hyperlipemia, mixed 03/15/2011   OAB (overactive bladder) 09/02/2010   Hypothyroidism 03/03/2010   CALF PAIN, LEFT 10/27/2009   Belching 07/21/2009   EAR PAIN 12/19/2008   Allergic rhinitis 12/19/2008   Irritable bowel syndrome 09/04/2008   PERSONAL HX COLONIC POLYPS 09/04/2008   GASTRITIS, CHRONIC 09/03/2008   DUODENITIS, WITHOUT HEMORRHAGE 09/03/2008   TIBIALIS TENDINITIS 03/20/2008   Osteoarthritis 02/25/2008   SWEATING 02/21/2008   LACTOSE INTOLERANCE 11/20/2007   Dyslipidemia 11/20/2007   GERD 11/20/2007   Osteoporosis 11/20/2007    REFERRING PROVIDER:  Enid Baas, MD    REFERRING DIAG:  [phone] (ICD-10-CM) - Right hip pain  M53.3 (ICD-10-CM) - Pain of right sacroiliac joint    Rationale for Evaluation and Treatment: Rehabilitation  THERAPY DIAG:  Right hip pain  ONSET DATE: months ago started at lumbar spine and worked its way to toes   SUBJECTIVE:                                                                                                                                                                                           SUBJECTIVE STATEMENT: Epidural in lumbar spine about a week ago was helpful. Did the exercises Dr Darrick Penna gave me and they helped some. At this point my balance is the biggest problem. I do not use an AD at home but I have a rollator and a SPC for when I go out. Right leg feels heavy.   PERTINENT HISTORY:  Osteoperosis, h/o  cervical fx, chronic SIJ pain, poor balance  PAIN:  Are you having pain? No  PRECAUTIONS:  Fall  WEIGHT BEARING RESTRICTIONS:  No  FALLS:  Has patient fallen in last 6 months? Yes. Number of falls 1  LIVING ENVIRONMENT: Lives with: lives alone   OCCUPATION:  retired  PLOF:  Independent  PATIENT GOALS:  Improve balance, decr leg heaviness   OBJECTIVE:   DIAGNOSTIC FINDINGS:  DG 02/01/23 Lumbar:  Grade 1 anterolisthesis of L4 versus L5. Grade 1 retrolisthesis of L1 versus L2 and L2 versus L3. No other malalignment. Multilevel degenerative disc disease with loss of disc height at multiple levels and vacuum disc phenomena at multiple levels. Severe lower lumbar facet degenerative changes are noted. Calcified atherosclerotic changes in the abdominal aorta and iliac vessels. Hip:. Visualized pelvic ring is intact. Degenerative changes of the right hip joint are noted. No acute fracture or dislocation is seen. No soft tissue abnormality is noted.   PATIENT SURVEYS:  FOTO 59  COGNITIVE STATUS: Within functional limits for tasks assessed   RED FLAGS: None    SENSATION: WFL   GAIT: Assistive device utilized: Environmental consultant - 4 wheeled Level of assistance: Modified independence Comments: Rt trendelenburg   Body Part #1 Lumbar  Lumbar ROM LUMBAR ROM:   WFL at eval  LE MMT LOWER EXTREMITY MMT:    MMT (lb) Right eval Left eval  Hip flexion 19.3 16.6  Hip extension    Hip abduction 5.1 11.5  Hip adduction    Hip internal rotation    Hip external rotation    Knee flexion 16.7 13.3  Knee extension 17.2 12.3  Ankle dorsiflexion    Ankle plantarflexion    Ankle inversion    Ankle eversion     (Blank rows = not tested)   BERG: 05/16/23 (see flowsheet for full test): 40/56  TREATMENT:                                                                                                                               DATE: EVAL 05/16/23  Bridge in DF Clam Sidelying  hip circles Lateral weight shift with OH reach   PATIENT EDUCATION:  Education details: Teacher, music of condition, POC, HEP, exercise form/rationale Person educated: Patient Education method: Programmer, multimedia, Demonstration, Tactile cues, Verbal cues, and Handouts Education comprehension: verbalized understanding, returned demonstration, verbal cues required, tactile cues required, and needs further education  HOME EXERCISE PROGRAM: [email or web address]   ASSESSMENT:  CLINICAL IMPRESSION: Patient is a 87 y.o. F who was seen today for physical therapy evaluation and treatment for pain in right hip. No LLD noted but pt demo significant weakness through LEs limiting functional ambulation.     OBJECTIVE IMPAIRMENTS: decreased activity tolerance, decreased balance, difficulty walking, decreased ROM, decreased strength, increased muscle spasms, improper body mechanics, postural dysfunction, and pain.   ACTIVITY LIMITATIONS: carrying, lifting, bending, standing, squatting, stairs, and locomotion level  PARTICIPATION LIMITATIONS: shopping and community  activity  PERSONAL FACTORS:  see PMH  are also affecting patient's functional outcome.   REHAB POTENTIAL: Good  CLINICAL DECISION MAKING: Evolving/moderate complexity  EVALUATION COMPLEXITY: Moderate   GOALS: Goals reviewed with patient? Yes  SHORT TERM GOALS: Target date: 06/04/23  Able to demo good contraction of hip abductors in CKC Baseline: Goal status: INITIAL  2.  Independent with HEP as it has been established Baseline:  Goal status: INITIAL    LONG TERM GOALS: Target date: POC date  Meet FOTO goal Baseline:  Goal status: INITIAL  2.  Resolution of trendelenburg  Baseline:  Goal status: INITIAL  3.  BERG to improve by MDC Baseline:  Goal status: INITIAL  4.  Navigate stairs step over step and with minimal to no discomfort Baseline:  Goal status: INITIAL    PLAN:  PT FREQUENCY: 1-2x/week  PT DURATION: 8 weeks  PLANNED  INTERVENTIONS: Therapeutic exercises, Therapeutic activity, Neuromuscular re-education, Balance training, Gait training, Patient/Family education, Self Care, Joint mobilization, Stair training, Aquatic Therapy, Dry Needling, Electrical stimulation, Spinal mobilization, Cryotherapy, Moist heat, Taping, Traction, Ultrasound, Ionotophoresis 4mg /ml Dexamethasone, Manual therapy, and Re-evaluation.  PLAN FOR NEXT SESSION: continue to progress lumbopelvic strength   Hawraa Stambaugh C. Anie Juniel PT, DPT 05/16/23 8:45 PM

## 2023-05-17 ENCOUNTER — Telehealth: Payer: Self-pay | Admitting: *Deleted

## 2023-05-17 DIAGNOSIS — M810 Age-related osteoporosis without current pathological fracture: Secondary | ICD-10-CM

## 2023-05-17 NOTE — Telephone Encounter (Signed)
Next Prolia due on or after 06/21/23.   Submitted to [employer] to review benefits for Prolia.

## 2023-05-18 ENCOUNTER — Encounter (HOSPITAL_BASED_OUTPATIENT_CLINIC_OR_DEPARTMENT_OTHER): Payer: Medicare Other | Admitting: Physician Assistant

## 2023-05-18 DIAGNOSIS — L97811 Non-pressure chronic ulcer of other part of right lower leg limited to breakdown of skin: Secondary | ICD-10-CM | POA: Diagnosis not present

## 2023-05-18 DIAGNOSIS — I87311 Chronic venous hypertension (idiopathic) with ulcer of right lower extremity: Secondary | ICD-10-CM | POA: Diagnosis not present

## 2023-05-18 DIAGNOSIS — L97822 Non-pressure chronic ulcer of other part of left lower leg with fat layer exposed: Secondary | ICD-10-CM | POA: Diagnosis not present

## 2023-05-18 DIAGNOSIS — I10 Essential (primary) hypertension: Secondary | ICD-10-CM | POA: Diagnosis not present

## 2023-05-18 NOTE — Progress Notes (Addendum)
HENCHY, MCCAULEY ([id number]) [id number] Page 1 of 7 Visit Report for 05/18/2023 Chief Complaint Document Details Patient Name: Date of Service: [name], [name] 05/18/2023 2:00 PM Medical Record Number: [id number] Patient Account Number: [id number] Date of Birth/Sex: Treating RN: 05-Sep-1936 (87 y.o. F) Primary Care Provider: [name] Other Clinician: Referring Provider: Treating Provider/Extender: [name], [name] in Treatment: 15 Information Obtained from: Patient Chief Complaint Bilateral LE Ulcers Electronic Signature(s) Signed: 05/18/2023 2:06:03 PM By: [name] PA-C Entered By: [name] on 05/18/2023 14:06:03 -------------------------------------------------------------------------------- HPI Details Patient Name: Date of Service: [name], [name] 05/18/2023 2:00 PM Medical Record Number: [id number] Patient Account Number: [id number] Date of Birth/Sex: Treating RN: 02-Mar-1936 (87 y.o. F) Primary Care Provider: [name] Other Clinician: Referring Provider: Treating Provider/Extender: [name] in Treatment: 15 History of Present Illness HPI Description: ADMISSION 02/02/23 Mrs. Samudio is an 87 year old woman who lives in the independent [facility] of the [facility]. She has been dealing with a leg wound since February 19 when she traumatized her right leg on a cleaning bucket. She has been using Bactroban and T and an Ace wrap. elfa Chronic low back pain, right hip pain, osteoporosis, hypertension, gait disease disorder she is not a diabetic. 02-16-2023 upon evaluation today patient appears to be doing excellent in regard to her wound although it does look a little bit dry overall I think we are still seeing some improvements here which is great news. Fortunately I do not see any evidence of active infection locally nor systemically which is great news. No  fevers, chills, nausea, vomiting, or diarrhea. 02-23-2023 upon evaluation today patient appears to be doing well currently in regard to her wounds. She actually is doing excellent and in fact the more medial portion of the wound is almost completely closed the lateral portion is much smaller I am very pleased with what we are seeing. 03-02-2023 upon evaluation today patient appears to be doing well currently in regard to her wound was actually showing signs of excellent improvement. Fortunately I do not see any evidence of active infection locally nor systemically which is great news and overall I am extremely pleased with where things stand. There is no sign of infection. No fevers, chills, nausea, vomiting, or diarrhea. 03-09-2023 upon evaluation today patient appears to be doing well currently in regard to her wound. This is actually showing signs of improvement I am actually very pleased with where we stand I think she is continuing to move in the right direction. She is very pleased and happy to hear this. 03-16-2023 upon evaluation today patient appears to be doing well with regard to her wounds visually and I do feel like she is making good progress here. Fortunately there does not appear to be any signs of active infection locally nor systemically which is great news. No fevers, chills, nausea, vomiting, or diarrhea. 03-30-2023 upon evaluation today patient appears to be doing well currently in regard to her wound. She is actually been tolerating the dressing changes without complication. Fortunately I do not see any signs of active infection locally nor systemically which is great news and I think that she is moving in the right direction. 04-06-2023 upon evaluation today patient actually appears to be doing great in regard to her wound she has been tolerating the dressing changes without ZAYLIN, PISTILLI ([id number]) [id number] Page 2 of 7 complication the collagen seems to  have done very well  over the past week for her this is great news. 04-13-2023 upon evaluation today patient appears to be doing well currently in regard to her wound. She in fact does not show any signs of infection but unfortunately does not look quite as healed as what we even saw last week. I am a little concerned that the dressing did not agree with her. We reason the collagen under the Xeroform I think it might have been just a little bit too wet. With that being said I am going to suggest based on what we are seeing that we go ahead and have the patient continue with the recommendation for a new dressing and will go ahead and make that switch they will be detailed in the plan. 04-20-2023 upon evaluation today patient appears to be doing well currently in regard to her wound which actually is showing signs of not being completely healed but getting very close. Fortunately I do not see any evidence of active infection locally or systemically which is great news. 04-27-2023 upon evaluation today patient appears to be doing well currently in regard to her right leg which actually appears to be completely healed. In regard to the left leg unfortunately she has new wounds that are showing up here on this left side and this is due to initially having she feels like to get this on something and then subsequently after that she ended up having an area that pulled off with the Band-Aid she put on I told her she does not need to be using standard Band-Aids as her skin is much too fragile. She voiced understanding. I did give her options for the next care do a bandages which I think would be a much better option and are much safer for her. Obviously right now using the bordered foam for the areas that we are treating regularly to treat this left side as well. 05-04-2023 upon evaluation patient's right leg is completely healed left leg below doing a little bit better does look like it require some sharp debridement  today to clearway the necrotic debris I discussed that with her at this point I do believe that we should be able to get this cleared up quite nicely. Fortunately I do not see any signs of active infection locally nor systemically which is great news. 05-11-2023 upon evaluation today patient appears to be doing well currently in regard to her wounds on the left leg the right leg is already showing signs of being completely healed which is great news. Fortunately I do not see any signs of active infection locally or systemically which is great news. 05-18-2023 upon evaluation today patient appears to be doing well currently in regard to her wound which is almost healed and actually appears to be doing excellent. I am actually very pleased with where we stand today. I do not see any signs of infection. Electronic Signature(s) Signed: 05/24/2023 1:02:49 PM By: [name] PA-C Entered By: [name] on 05/24/2023 13:02:48 -------------------------------------------------------------------------------- Physical Exam Details Patient Name: Date of Service: [name], Ellyce H. 05/18/2023 2:00 PM Medical Record Number: [id number] Patient Account Number: [id number] Date of Birth/Sex: Treating RN: January 28, 1936 (87 y.o. F) Primary Care Provider: [name] Other Clinician: Referring Provider: Treating Provider/Extender: [name], [name] in Treatment: 15 Constitutional Well-nourished and well-hydrated in no acute distress. Respiratory normal breathing without difficulty. Psychiatric this patient is able to make decisions and demonstrates good insight into disease process. Alert and Oriented x 3.  pleasant and cooperative. Notes Upon inspection patient's wound actually showing signs of excellent improvement she is very close to complete resolution much and very pleased with where things stand currently. Electronic Signature(s) Signed: 05/24/2023 1:16:40 PM By: [name]  PA-C Entered By: [name] on 05/24/2023 13:16:40 -------------------------------------------------------------------------------- Physician Orders Details Patient Name: Date of Service: [name], Tequila H. 05/18/2023 2:00 PM Medical Record Number: [id number] Patient Account Number: [id number] Date of Birth/Sex: Treating RN: [birth year] (87 y.o. Katrinka Blazing Primary Care Provider: [name] Other Clinician: BRYTTANY, TORTORELLI ([id number]) [id number] Page 3 of 7 Referring Provider: Treating Provider/Extender: [name], [name] in Treatment: 15 Verbal / Phone Orders: No Diagnosis Coding ICD-10 Coding Code Description L97.811 Non-pressure chronic ulcer of other part of right lower leg limited to breakdown of skin S80.811D Abrasion, right lower leg, subsequent encounter L97.822 Non-pressure chronic ulcer of other part of left lower leg with fat layer exposed S80.812A Abrasion, left lower leg, initial encounter I87.311 Chronic venous hypertension (idiopathic) with ulcer of right lower extremity Follow-up Appointments ppointment in 1 week. [facility] room 7 Wednesday June 26th 2:30pm Return A Other: - [facility] ***[facility] ask for silicone bordered foam dressings. and AandD Ointment **** Bathing/ Shower/ Hygiene May shower and wash wound with soap and water. Edema Control - Lymphedema / SCD / Other Bilateral Lower Extremities Elevate legs to the level of the heart or above for 30 minutes daily and/or when sitting for 3-4 times a day throughout the day. Avoid standing for long periods of time. Patient to wear own compression stockings every day. - pt to order from [facility]. Exercise regularly Non Wound Condition Right Lower Extremity Other Non Wound Condition Orders/Instructions: - Right Anterior Lower Leg can use Foam Border to help protect skin. Wound Treatment Wound #3 - Lower Leg Wound Laterality: Left,  Anterior Cleanser: Soap and Water 3 x Per Week/30 Days Discharge Instructions: May shower and wash wound with dial antibacterial soap and water prior to dressing change. Peri-Wound Care: Skin Prep (DME) (Generic) 3 x Per Week/30 Days Discharge Instructions: Use skin prep as directed Topical: AandD ointment 3 x Per Week/30 Days Discharge Instructions: small amount to wound bed before PolyMEM Prim Dressing: PolyMem Non-Adhesive Dressing, 4x4 in (DME) (Generic) 3 x Per Week/30 Days ary Discharge Instructions: Apply to wound bed as instructed Secondary Dressing: Zetuvit Plus Silicone Border Dressing 4x4 (in/in) (DME) (Generic) 3 x Per Week/30 Days Discharge Instructions: Apply silicone border over primary dressing as directed. Electronic Signature(s) Signed: 05/18/2023 5:34:23 PM By: [name] RN Signed: 05/19/2023 5:52:49 PM By: [name] PA-C Entered By: [name] on 05/18/2023 14:38:55 -------------------------------------------------------------------------------- Problem List Details Patient Name: Date of Service: [name], [name] 05/18/2023 2:00 PM Medical Record Number: [id number] Patient Account Number: [id number] Date of Birth/Sex: Treating RN: Mar 23, 1936 (87 y.o. 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 05/18/2023 2:00 PM Medical Record Number: [id number] Patient Account Number: [id number] Date of Birth/Sex: Treating RN: [birth year] (87 y.o. F) Primary Care Provider: [name] Other Clinician: Referring Provider: Treating Provider/Extender: [name] in Treatment: 15 Subjective Chief Complaint Information obtained from Patient Bilateral LE Ulcers History of Present Illness (HPI) ADMISSION 02/02/23 Mrs. Dickman is an 87 year old woman who lives in the independent part of the [facility]. She has been dealing with a leg wound since February 19 when she traumatized her right leg on a cleaning bucket. She has been using Bactroban and T and an Ace wrap. elfa Chronic low back pain, right hip pain, osteoporosis, hypertension, gait disease disorder she is not a diabetic. 02-16-2023 upon evaluation today patient appears to be doing excellent in regard to her wound although it does look a little bit dry overall I think we are still seeing some improvements here which is great news. Fortunately I do not see any evidence of active infection locally nor systemically which is great news. No fevers, chills, nausea, vomiting, or diarrhea. 02-23-2023 upon evaluation today patient appears to be doing well currently in regard to her wounds. She actually is doing excellent and in fact the more medial portion of the wound is almost completely closed the lateral portion is much smaller I am very pleased with what we are seeing. 03-02-2023 upon evaluation today patient  appears to be doing well currently in regard to her wound was actually showing signs of excellent improvement. Fortunately I do not see any evidence of active infection locally nor systemically which is great news and overall I am extremely pleased with where things stand. There is no sign of infection. No fevers, chills, nausea, vomiting, or diarrhea. 03-09-2023 upon evaluation today patient appears to be doing well currently in regard to her wound. This is actually showing signs of improvement I am actually JOANI, COSMA ([id number]) [id number] Page 5 of 7 very pleased with where we stand I think she is continuing to move in the right direction. She is very pleased and happy to hear this. 03-16-2023 upon evaluation today patient appears to be doing well with regard to her wounds visually and I do feel like she is making good progress here. Fortunately there does not appear to be any signs of active infection locally nor systemically which is great news. No fevers, chills, nausea, vomiting, or diarrhea. 03-30-2023 upon evaluation today patient appears to be doing well currently in regard to her wound. She is actually been tolerating the dressing changes without complication. Fortunately I do not see any signs of active infection locally nor systemically which is great news and I think that she is moving in the right direction. 04-06-2023 upon evaluation today patient actually appears to be doing great in regard to her wound she has been tolerating the dressing changes without complication the collagen seems to have done very well over the past week for her this is great news. 04-13-2023 upon evaluation today patient appears to be doing well currently in regard to her wound. She in fact does not show any signs of infection but unfortunately does not look quite as healed as what we even saw last week. I am a little concerned that the dressing did not agree with  her. We reason  the collagen under the Xeroform I think it might have been just a little bit too wet. With that being said I am going to suggest based on what we are seeing that we go ahead and have the patient continue with the recommendation for a new dressing and will go ahead and make that switch they will be detailed in the plan. 04-20-2023 upon evaluation today patient appears to be doing well currently in regard to her wound which actually is showing signs of not being completely healed but getting very close. Fortunately I do not see any evidence of active infection locally or systemically which is great news. 04-27-2023 upon evaluation today patient appears to be doing well currently in regard to her right leg which actually appears to be completely healed. In regard to the left leg unfortunately she has new wounds that are showing up here on this left side and this is due to initially having she feels like to get this on something and then subsequently after that she ended up having an area that pulled off with the Band-Aid she put on I told her she does not need to be using standard Band-Aids as her skin is much too fragile. She voiced understanding. I did give her options for the next care do a bandages which I think would be a much better option and are much safer for her. Obviously right now using the bordered foam for the areas that we are treating regularly to treat this left side as well. 05-04-2023 upon evaluation patient's right leg is completely healed left leg below doing a little bit better does look like it require some sharp debridement today to clearway the necrotic debris I discussed that with her at this point I do believe that we should be able to get this cleared up quite nicely. Fortunately I do not see any signs of active infection locally nor systemically which is great news. 05-11-2023 upon evaluation today patient appears to be doing well currently in regard to her wounds on the left leg  the right leg is already showing signs of being completely healed which is great news. Fortunately I do not see any signs of active infection locally or systemically which is great news. 05-18-2023 upon evaluation today patient appears to be doing well currently in regard to her wound which is almost healed and actually appears to be doing excellent. I am actually very pleased with where we stand today. I do not see any signs of infection. Objective Constitutional Well-nourished and well-hydrated in no acute distress. Vitals Time Taken: 2:18 PM, Height: 61 in, Weight: 113 lbs, BMI: 21.3, Temperature: 98 F, Pulse: 78 bpm, Respiratory Rate: 16 breaths/min, Blood Pressure: 123/78 mmHg. Respiratory normal breathing without difficulty. Psychiatric this patient is able to make decisions and demonstrates good insight into disease process. Alert and Oriented x 3. pleasant and cooperative. General Notes: Upon inspection patient's wound actually showing signs of excellent improvement she is very close to complete resolution much and very pleased with where things stand currently. Integumentary (Hair, Skin) Wound #2 status is Healed - Epithelialized. Original cause of wound was Trauma. The date acquired was: 04/21/2023. The wound has been in treatment 3 weeks. The wound is located on the Left,Medial Lower Leg. The wound measures 0cm length x 0cm width x 0cm depth; 0cm^2 area and 0cm^3 volume. There is no tunneling or undermining noted. There is a none present amount of drainage noted. The wound margin is distinct  with the outline attached to the wound base. There is no granulation within the wound bed. There is no necrotic tissue within the wound bed. The periwound skin appearance had no abnormalities noted for texture. The periwound skin appearance had no abnormalities noted for moisture. The periwound skin appearance had no abnormalities noted for color. Periwound temperature was noted as No  Abnormality. Wound #3 status is Open. Original cause of wound was Trauma. The date acquired was: 04/21/2023. The wound has been in treatment 3 weeks. The wound is located on the Left,Anterior Lower Leg. The wound measures 0.9cm length x 0.5cm width x 0.1cm depth; 0.353cm^2 area and 0.035cm^3 volume. There is Fat Layer (Subcutaneous Tissue) exposed. There is no tunneling or undermining noted. There is a none present amount of drainage noted. The wound margin is distinct with the outline attached to the wound base. There is medium (34-66%) red granulation within the wound bed. There is a medium (34-66%) amount of necrotic tissue within the wound bed including Eschar and Adherent Slough. The periwound skin appearance had no abnormalities noted for moisture. The periwound skin appearance exhibited: Scarring, Hemosiderin Staining. The periwound skin appearance did not exhibit: Callus, Crepitus, Excoriation, Induration, Rash, Atrophie Blanche, Cyanosis, Ecchymosis, Mottled, Pallor, Rubor, Erythema. Periwound temperature was noted as No Abnormality. Assessment Active Problems ICD-10 Non-pressure chronic ulcer of other part of right lower leg limited to breakdown of skin Abrasion, right lower leg, subsequent encounter MIKINZIE, MACIEJEWSKI ([id number]) [id number].pdf Page 6 of 7 Non-pressure chronic ulcer of other part of left lower leg with fat layer exposed Abrasion, left lower leg, initial encounter Chronic venous hypertension (idiopathic) with ulcer of right lower extremity Plan Follow-up Appointments: Return Appointment in 1 week. [facility] room 7 Wednesday June 26th 2:30pm Other: - [facility] ***[facility] ask for silicone bordered foam dressings. and AandD Ointment **** Bathing/ Shower/ Hygiene: May shower and wash wound with soap and water. Edema Control - Lymphedema / SCD / Other: Elevate legs to the level of the heart or above for 30 minutes daily and/or when sitting  for 3-4 times a day throughout the day. Avoid standing for long periods of time. Patient to wear own compression stockings every day. - pt to order from [facility]. Exercise regularly Non Wound Condition: Other Non Wound Condition Orders/Instructions: - Right Anterior Lower Leg can use Foam Border to help protect skin. WOUND #3: - Lower Leg Wound Laterality: Left, Anterior Cleanser: Soap and Water 3 x Per Week/30 Days Discharge Instructions: May shower and wash wound with dial antibacterial soap and water prior to dressing change. Peri-Wound Care: Skin Prep (DME) (Generic) 3 x Per Week/30 Days Discharge Instructions: Use skin prep as directed Topical: AandD ointment 3 x Per Week/30 Days Discharge Instructions: small amount to wound bed before PolyMEM Prim Dressing: PolyMem Non-Adhesive Dressing, 4x4 in (DME) (Generic) 3 x Per Week/30 Days ary Discharge Instructions: Apply to wound bed as instructed Secondary Dressing: Zetuvit Plus Silicone Border Dressing 4x4 (in/in) (DME) (Generic) 3 x Per Week/30 Days Discharge Instructions: Apply silicone border over primary dressing as directed. 1. I would recommend that we have the patient continue to monitor for any signs of infection or worsening. She is in agreement with that plan. 2 I am good recommend as well that she should continue specifically with the wound care measures as before this is using the PolyMem after applying the AandD ointment. 3. He should also continue to cover this with a Zetuvit. We will see patient back for reevaluation  in 1 week here in the clinic. If anything worsens or changes patient will contact our office for additional recommendations. Electronic Signature(s) Signed: 05/24/2023 1:17:05 PM By: [name] PA-C Entered By: [name] on 05/24/2023 13:17:05 -------------------------------------------------------------------------------- SuperBill Details Patient Name: Date of Service: [name], Shrika H.  05/18/2023 Medical Record Number: [id number] Patient Account Number: [id number] Date of Birth/Sex: Treating RN: [birth year] (87 y.o. Katrinka Blazing Primary Care Provider: [name] Other Clinician: Referring Provider: Treating Provider/Extender: [name] in Treatment: 15 Diagnosis Coding ICD-10 Codes Code Description 220 341 2021 Non-pressure chronic ulcer of other part of right lower leg limited to breakdown of skin S80.811D Abrasion, right lower leg, subsequent encounter L97.822 Non-pressure chronic ulcer of other part of left lower leg with fat layer exposed S80.812A Abrasion, left lower leg, initial encounter I87.311 Chronic venous hypertension (idiopathic) with ulcer of right lower extremity Facility Procedures : LELLA, MULLANY [facility] ([id number]) [id number] Description: [phone] - WOUND CARE VISIT-LEV 3 EST PT Modifier: [id number] P 1 Quantity: [age] Physician Procedures : CPT4 Code Description Modifier [phone] [id number] - WC PHYS LEVEL 3 - EST PT ICD-10 Diagnosis Description L97.811 Non-pressure chronic ulcer of other part of right lower leg limited to breakdown of skin S80.811D Abrasion, right lower leg, subsequent  encounter L97.822 Non-pressure chronic ulcer of other part of left lower leg with fat layer exposed S80.812A Abrasion, left lower leg, initial encounter Quantity: 1 Electronic Signature(s) Signed: 05/24/2023 1:17:22 PM By: [name] PA-C Previous Signature: 05/18/2023 5:34:23 PM Version By: [name] RN Previous Signature: 05/19/2023 5:52:49 PM Version By: [name] PA-C Entered By: [name] on 05/24/2023 13:17:22

## 2023-05-19 ENCOUNTER — Ambulatory Visit: Payer: Medicare Other | Admitting: Sports Medicine

## 2023-05-19 NOTE — Progress Notes (Signed)
[name] [name] ([id number]) [id number] Page 1 of 8 Visit Report for 05/18/2023 Arrival Information Details Patient Name: Date of Service: [name] [name] SABIC 05/18/2023 2:00 PM Medical Record Number: [id number] Patient Account Number: [id number] Date of Birth/Sex: Treating RN: 12-19-1935 (87 y.o. [name] [name] Primary Care Rihan [name]: [name] [name] Other Clinician: Referring [name] [name]: Treating [name] [name]/Extender: [name] [name] in Treatment: 15 Visit Information History Since Last Visit Added or deleted any medications: No Patient Arrived: Gilmer Mor Any new allergies or adverse reactions: No Arrival Time: 14:12 Had a fall or experienced change in No Accompanied By: self activities of daily living that may affect Transfer Assistance: None risk of falls: Patient Identification Verified: Yes Signs or symptoms of abuse/neglect since last visito No Patient Requires Transmission-Based Precautions: No Hospitalized since last visit: No Patient Has Alerts: No Implantable device outside of the clinic excluding No cellular tissue based products placed in the center since last visit: Has Dressing in Place as Prescribed: Yes Pain Present Now: No Electronic Signature(s) Signed: 05/18/2023 5:34:23 PM By: [name] Schwalbe RN Entered By: [name] [name] on 05/18/2023 14:13:16 -------------------------------------------------------------------------------- Clinic Level of Care Assessment Details Patient Name: Date of Service: [name] [name] 05/18/2023 2:00 PM Medical Record Number: [id number] Patient Account Number: [id number] Date of Birth/Sex: Treating RN: [birth year] (87 y.o. [name] [name] Primary Care Khaleef Ruby: [name] [name] Other Clinician: Referring [name] [name]: Treating [name] [name] in Treatment: 15 Clinic Level of Care Assessment Items TOOL 4 Quantity Score X- 1 0 Use when  only an EandM is performed on FOLLOW-UP visit ASSESSMENTS - Nursing Assessment / Reassessment X- 1 10 Reassessment of [name]-morbidities (includes updates in patient status) X- 1 5 Reassessment of Adherence to Treatment Plan ASSESSMENTS - Wound and Skin A ssessment / Reassessment X - Simple Wound Assessment / Reassessment - one wound 1 5 []  - 0 Complex Wound Assessment / Reassessment - multiple wounds []  - 0 Dermatologic / Skin Assessment (not related to wound area) ASSESSMENTS - Focused Assessment []  - 0 Circumferential Edema Measurements - multi extremities []  - 0 Nutritional Assessment / Counseling / Intervention [name] [name] ([id number]) [id number] Page 2 of 8 []  - 0 Lower Extremity Assessment (monofilament, tuning fork, pulses) []  - 0 Peripheral Arterial Disease Assessment (using hand held doppler) ASSESSMENTS - Ostomy and/or Continence Assessment and Care []  - 0 Incontinence Assessment and Management []  - 0 Ostomy Care Assessment and Management (repouching, etc.) PROCESS - Coordination of Care X - Simple Patient / Family Education for ongoing care 1 15 []  - 0 Complex (extensive) Patient / Family Education for ongoing care X- 1 10 Staff obtains Chiropractor, Records, T Results / Process Orders est X- 1 10 Staff telephones HHA, Nursing Homes / Clarify orders / etc []  - 0 Routine Transfer to another Facility (non-emergent condition) []  - 0 Routine Hospital Admission (non-emergent condition) []  - 0 New Admissions / Manufacturing engineer / Ordering NPWT Apligraf, etc. , []  - 0 Emergency Hospital Admission (emergent condition) X- 1 10 Simple Discharge Coordination []  - 0 Complex (extensive) Discharge Coordination PROCESS - Special Needs []  - 0 Pediatric / Minor Patient Management []  - 0 Isolation Patient Management []  - 0 Hearing / Language / Visual special needs []  - 0 Assessment of Community assistance (transportation, D/C planning,  etc.) []  - 0 Additional assistance / Altered mentation []  - 0 Support Surface(s) Assessment (bed, cushion, seat, etc.) INTERVENTIONS - Wound Cleansing / Measurement X - Simple Wound Cleansing -  one wound 1 5 []  - 0 Complex Wound Cleansing - multiple wounds X- 1 5 Wound Imaging (photographs - any number of wounds) []  - 0 Wound Tracing (instead of photographs) X- 1 5 Simple Wound Measurement - one wound []  - 0 Complex Wound Measurement - multiple wounds INTERVENTIONS - Wound Dressings X - Small Wound Dressing one or multiple wounds 1 10 []  - 0 Medium Wound Dressing one or multiple wounds []  - 0 Large Wound Dressing one or multiple wounds []  - 0 Application of Medications - topical []  - 0 Application of Medications - injection INTERVENTIONS - Miscellaneous []  - 0 External ear exam []  - 0 Specimen Collection (cultures, biopsies, blood, body fluids, etc.) []  - 0 Specimen(s) / Culture(s) sent or taken to Lab for analysis []  - 0 Patient Transfer (multiple staff / Nurse, adult / Similar devices) []  - 0 Simple Staple / Suture removal (25 or less) []  - 0 Complex Staple / Suture removal (26 or more) []  - 0 Hypo / Hyperglycemic Management (close monitor of Blood Glucose) [name] [name] ([id number]) [id number] Page 3 of 8 []  - 0 Ankle / Brachial Index (ABI) - do not check if billed separately X- 1 5 Vital Signs Has the patient been seen at the hospital within the last three years: Yes Total Score: 95 Level Of Care: New/Established - Level 3 Electronic Signature(s) Signed: 05/18/2023 5:34:23 PM By: [name] Schwalbe RN Entered By: [name] [name] on 05/18/2023 17:19:58 -------------------------------------------------------------------------------- Encounter Discharge Information Details Patient Name: Date of Service: [name] [name] H. 05/18/2023 2:00 PM Medical Record Number: [id number] Patient Account Number: [id number] Date of Birth/Sex: Treating  RN: [birth year] (87 y.o. [name] [name] Primary Care Yzabella Crunk: [name] [name] Other Clinician: Referring Jameson Morrow: Treating Maximiliano Cromartie/Extender: [name], [name] in Treatment: 15 Encounter Discharge Information Items Discharge Condition: Stable Ambulatory Status: Walker Discharge Destination: Home Transportation: Private Auto Accompanied By: self Schedule Follow-up Appointment: Yes Clinical Summary of Care: Patient Declined Electronic Signature(s) Signed: 05/18/2023 5:34:23 PM By: [name] Schwalbe RN Entered By: [name] [name] on 05/18/2023 17:21:36 -------------------------------------------------------------------------------- Lower Extremity Assessment Details Patient Name: Date of Service: [name] [name] WHITMORE. 05/18/2023 2:00 PM Medical Record Number: [id number] Patient Account Number: [id number] Date of Birth/Sex: Treating RN: 1936/11/07 (87 y.o. [name] [name] Primary Care Lyell Clugston: [name] [name] Other Clinician: Referring Angelis Gates: Treating Jerricka Carvey/Extender: [name], [name] in Treatment: 15 Edema Assessment Assessed: [Left: No] [Right: No] Edema: [Left: N] [Right: o] Calf Left: Right: Point of Measurement: 28 cm From Medial Instep 33 cm Ankle Left: Right: Point of Measurement: 10 cm From Medial Instep 20.4 cm Vascular Assessment MARISSA, RASSEL ([id number]) [Right:[id number] Page 4 of 8] Pulses: Dorsalis Pedis Palpable: [Left:Yes] Electronic Signature(s) Signed: 05/18/2023 5:34:23 PM By: [name] Schwalbe RN Entered By: [name] [name] on 05/18/2023 14:17:03 -------------------------------------------------------------------------------- Multi-Disciplinary Care Plan Details Patient Name: Date of Service: [name] [name] H. 05/18/2023 2:00 PM Medical Record Number: [id number] Patient Account Number: [id number] Date of Birth/Sex: Treating RN: July 30, 1936 (87 y.o. [name] [name] Primary Care Rook Maue: [name] [name] Other Clinician: Referring Ellysa Parrack: Treating Tyrianna Lightle/Extender: [name] [name] in Treatment: 15 Active Inactive Wound/Skin Impairment Nursing Diagnoses: Impaired tissue integrity Knowledge deficit related to ulceration/compromised skin integrity Goals: Patient/caregiver will verbalize understanding of skin care regimen Date Initiated: 02/02/2023 Target Resolution Date: 07/30/2023 Goal Status: Active Ulcer/skin breakdown will have a volume reduction of 30% by week 4 Date Initiated: 02/02/2023 Date Inactivated: 03/02/2023 Target Resolution Date:  03/02/2023 Goal Status: Met Interventions: Assess patient/caregiver ability to obtain necessary supplies Assess patient/caregiver ability to perform ulcer/skin care regimen upon admission and as needed Assess ulceration(s) every visit Provide education on ulcer and skin care Notes: Electronic Signature(s) Signed: 05/18/2023 5:34:23 PM By: [name] Schwalbe RN Entered By: [name] [name] on 05/18/2023 17:18:55 -------------------------------------------------------------------------------- Pain Assessment Details Patient Name: Date of Service: [name] [name] H. 05/18/2023 2:00 PM Medical Record Number: [id number] Patient Account Number: [id number] Date of Birth/Sex: Treating RN: 1935/12/23 (87 y.o. [name] [name] Primary Care Keneshia Tena: [name] [name] Other Clinician: Referring Norissa Bartee: Treating Mulki Roesler/Extender: [name], [name] in Treatment: [address] BERTHINE, KOERBER ([id number]) [id number] Page 5 of 8 Location of Pain Severity and Description of Pain Patient Has Paino No Site Locations Pain Management and Medication Current Pain Management: Electronic Signature(s) Signed: 05/18/2023 5:34:23 PM By: [name] Schwalbe RN Entered By: [name] [name] on 05/18/2023  14:16:53 -------------------------------------------------------------------------------- Patient/Caregiver Education Details Patient Name: Date of Service: [name] [name] [name] 6/19/2024andnbsp2:00 PM Medical Record Number: [id number] Patient Account Number: [id number] Date of Birth/Gender: Treating RN: 07/01/1936 (87 y.o. [name] [name] Primary Care Physician: [name] [name] Other Clinician: Referring Physician: Treating Physician/Extender: [name] [name] in Treatment: 15 Education Assessment Education Provided To: Patient Education Topics Provided Wound/Skin Impairment: Methods: Explain/Verbal Responses: Return demonstration correctly Electronic Signature(s) Signed: 05/18/2023 5:34:23 PM By: [name] Schwalbe RN Entered By: [name] [name] on 05/18/2023 17:19:07 -------------------------------------------------------------------------------- Wound Assessment Details Patient Name: Date of Service: [name] [name] H. 05/18/2023 2:00 PM TIEASHA, CARCHI ([id number]) [id number] Page 6 of 8 Medical Record Number: [id number] Patient Account Number: [id number] Date of Birth/Sex: Treating RN: 1936-04-23 (87 y.o. [name] [name] Primary Care Calvina Liptak: [name] [name] Other Clinician: Referring Nolyn Eilert: Treating Cassundra Mckeever/Extender: [name] [name] in Treatment: 15 Wound Status Wound Number: 2 Primary Etiology: Skin Tear Wound Location: Left, Medial Lower Leg Wound Status: Healed - Epithelialized Wounding Event: Trauma Comorbid Cataracts, Asthma, Congestive Heart Failure, History: Osteoarthritis Date Acquired: 04/21/2023 Weeks Of Treatment: 3 Clustered Wound: Yes Photos Wound Measurements Length: (cm) Width: (cm) Depth: (cm) Clustered Quantity: Area: (cm) Volume: (cm) 0 % Reduction in Area: 100% 0 % Reduction in Volume: 100% 0 Epithelialization: Large (67-100%) 2 Tunneling: No 0  Undermining: No 0 Wound Description Classification: Full Thickness Without Exposed Support Structures Wound Margin: Distinct, outline attached Exudate Amount: None Present Foul Odor After Cleansing: No Slough/Fibrino No Wound Bed Granulation Amount: None Present (0%) Exposed Structure Necrotic Amount: None Present (0%) Fascia Exposed: No Fat Layer (Subcutaneous Tissue) Exposed: No Tendon Exposed: No Muscle Exposed: No Joint Exposed: No Bone Exposed: No Periwound Skin Texture Texture Color No Abnormalities Noted: Yes No Abnormalities Noted: Yes Moisture Temperature / Pain No Abnormalities Noted: Yes Temperature: No Abnormality Electronic Signature(s) Signed: 05/18/2023 5:34:23 PM By: [name] Schwalbe RN Entered By: [name] [name] on 05/18/2023 14:33:39 -------------------------------------------------------------------------------- Wound Assessment Details Patient Name: Date of Service: [name] [name] H. 05/18/2023 2:00 PM Medical Record Number: [id number] Patient Account Number: [id number] Date of Birth/Sex: Treating RN: [birth year] (87 y.o. [address], [place], [place] ([id number]) [id number] Page 7 of 8 Primary Care Tinzlee Craker: [name] [name] Other Clinician: Referring Analeigh Aries: Treating Evyn Putzier/Extender: [name], Georgina Quint Weeks in Treatment: 15 Wound Status Wound Number: 3 Primary Etiology: Abrasion Wound Location: Left, Anterior Lower Leg Wound Status: Open Wounding Event: Trauma Comorbid Cataracts, Asthma, Congestive Heart Failure, History: Osteoarthritis Date Acquired: 04/21/2023 Weeks Of Treatment: 3 Clustered  Wound: No Photos Wound Measurements Length: (cm) 0.9 Width: (cm) 0.5 Depth: (cm) 0.1 Area: (cm) 0.353 Volume: (cm) 0.035 % Reduction in Area: 77% % Reduction in Volume: 77.1% Epithelialization: Large (67-100%) Tunneling: No Undermining: No Wound Description Classification: Full Thickness Without  Exposed Support Structures Wound Margin: Distinct, outline attached Exudate Amount: None Present Foul Odor After Cleansing: No Slough/Fibrino Yes Wound Bed Granulation Amount: Medium (34-66%) Exposed Structure Granulation Quality: Red Fascia Exposed: No Necrotic Amount: Medium (34-66%) Fat Layer (Subcutaneous Tissue) Exposed: Yes Necrotic Quality: Eschar, Adherent Slough Tendon Exposed: No Muscle Exposed: No Joint Exposed: No Bone Exposed: No Periwound Skin Texture Texture Color No Abnormalities Noted: No No Abnormalities Noted: No Callus: No Atrophie Blanche: No Crepitus: No Cyanosis: No Excoriation: No Ecchymosis: No Induration: No Erythema: No Rash: No Hemosiderin Staining: Yes Scarring: Yes Mottled: No Pallor: No Moisture Rubor: No No Abnormalities Noted: Yes Temperature / Pain Temperature: No Abnormality Treatment Notes Wound #3 (Lower Leg) Wound Laterality: Left, Anterior Cleanser Soap and Water Discharge Instruction: May shower and wash wound with dial antibacterial soap and water prior to dressing change. Peri-Wound Care Skin Prep Discharge Instruction: Use skin prep as directed ZADE, TURNIER ([id number]) [id number] Page 8 of 8 Topical AandD ointment Discharge Instruction: small amount to wound bed before PolyMEM Primary Dressing PolyMem Non-Adhesive Dressing, 4x4 in Discharge Instruction: Apply to wound bed as instructed Secondary Dressing Zetuvit Plus Silicone Border Dressing 4x4 (in/in) Discharge Instruction: Apply silicone border over primary dressing as directed. Secured With Compression Wrap Compression Stockings Facilities manager) Signed: 05/18/2023 5:34:23 PM By: [name] Schwalbe RN Entered By: [name] [name] on 05/18/2023 14:25:54 -------------------------------------------------------------------------------- Vitals Details Patient Name: Date of Service: [name] [name] H. 05/18/2023 2:00  PM Medical Record Number: [id number] Patient Account Number: [id number] Date of Birth/Sex: Treating RN: [birth year] (87 y.o. [name] [name] Primary Care Khriz Liddy: [name] [name] Other Clinician: Referring Kitrina Maurin: Treating Vallorie Niccoli/Extender: [name] [name] in Treatment: 15 Vital Signs Time Taken: 14:18 Temperature (F): 98 Height (in): 61 Pulse (bpm): 78 Weight (lbs): 113 Respiratory Rate (breaths/min): 16 Body Mass Index (BMI): 21.3 Blood Pressure (mmHg): 123/78 Reference Range: 80 - 120 mg / dl Electronic Signature(s) Signed: 05/18/2023 5:34:23 PM By: [name] Schwalbe RN Entered By: [name] [name] on 05/18/2023 14:22:12

## 2023-05-23 ENCOUNTER — Other Ambulatory Visit: Payer: Medicare Other

## 2023-05-23 ENCOUNTER — Ambulatory Visit (HOSPITAL_BASED_OUTPATIENT_CLINIC_OR_DEPARTMENT_OTHER): Payer: Medicare Other

## 2023-05-23 ENCOUNTER — Encounter (HOSPITAL_BASED_OUTPATIENT_CLINIC_OR_DEPARTMENT_OTHER): Payer: Self-pay

## 2023-05-23 DIAGNOSIS — M25551 Pain in right hip: Secondary | ICD-10-CM

## 2023-05-23 NOTE — Therapy (Signed)
OUTPATIENT PHYSICAL THERAPY TREATMENT   Patient Name: [name] MRN: [id number] DOB:[birth year], 87 y.o., female Today's Date: 05/23/2023  END OF SESSION:  PT End of Session - 05/23/23 1341     Visit Number 2    Number of Visits 9    Date for PT Re-Evaluation 07/16/23    PT Start Time 1303    PT Stop Time 1345    PT Time Calculation (min) 42 min    Activity Tolerance Patient tolerated treatment well    Behavior During Therapy WFL for tasks assessed/performed              Past Medical History:  Diagnosis Date   Allergic rhinitis    Asthma    Atrophic vaginitis    Baker's cyst    Left-Dr. Aluisio   Cataract    Dr. Dione Booze   CHF (congestive heart failure) (HCC)    Colon polyps    Fibroid    Gastritis    chronic   GERD (gastroesophageal reflux disease)    Heart murmur    Hemorrhoids    Hyperlipidemia    Hypothyroidism    IBS (irritable bowel syndrome)    Lumbar spondylosis    MVP (mitral valve prolapse)    Antibiotics required for dental procedures   OA (osteoarthritis)    Osteoporosis 06/2018   T score -2.2 stable on Prolia   Overactive bladder    Sacroiliitis (HCC)    Thyroid disease    Thyroid nodule    Torn meniscus    bilateral   Past Surgical History:  Procedure Laterality Date   CATARACT EXTRACTION, BILATERAL     EYE SURGERY     POSTERIOR CERVICAL FUSION/FORAMINOTOMY N/A 05/15/2021   Procedure: Cervical One Laminectomy with resection of cyst, Fixation from Occiput to Cervical Four;  Surgeon: [name], MD;  Location: [facility] OR;  Service: Neurosurgery;  Laterality: N/A;   TONSILLECTOMY     Patient Active Problem List   Diagnosis Date Noted   Chalazion left upper eyelid 04/18/2023   Bruising 04/06/2023   Leg wound, right, initial encounter 01/17/2023   Pain of right sacroiliac joint 01/14/2023   Lumbar degenerative disc disease 12/21/2022   Chronic left SI joint pain 11/30/2022   Inflammation of sacroiliac joint (HCC) 11/16/2022   Stool  incontinence 11/16/2022   Memory impairment 11/08/2022   Grief 10/06/2022   History of basal cell carcinoma 09/29/2022   Open forehead wound 07/21/2022   Stress at home 07/21/2022   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   Gait disorder 02/28/2022   History of malignant neoplasm of skin 02/22/2022   Melanocytic nevi of trunk 02/22/2022   Seborrheic dermatitis 02/22/2022   Urticaria 02/22/2022   Irregular bowel habits 11/18/2021   Scalp laceration, sequela 11/02/2021   Concussion 10/29/2021   Scalp itch 08/19/2021   Allergic rhinitis due to animal (cat) (dog) hair and dander 08/07/2021   Allergic rhinitis due to pollen 08/07/2021   Mild intermittent asthma 08/07/2021   Closed fracture of first cervical vertebra (HCC) 08/07/2021   Closed fracture of second cervical vertebra (HCC) 08/07/2021   Cervical spine instability 05/15/2021   Closed 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 05/13/2021   Cervical spinal cord compression (HCC) 05/04/2021   Fall against object 03/25/2021   Hematoma of face, initial encounter 03/25/2021   Degenerative disc disease, cervical 03/11/2021   Spondylolisthesis, cervical region 03/11/2021   Trochanteric bursitis of right hip 09/22/2020   Elevated blood-pressure reading, without diagnosis of hypertension 07/14/2020  Meningioma (HCC) 03/25/2020   Arthralgia 03/18/2020   RUQ pain 01/02/2020   Cholelithiasis 09/09/2019   Ear pain, bilateral 03/21/2019   Cervical spondylosis 01/05/2019   Neck pain 12/26/2018   Acute pain of left shoulder 12/26/2018   Anxiety 08/22/2018   Contact dermatitis and eczema 06/14/2018   Constipation 11/11/2016   Low back pain 11/10/2016   Leg abrasion 08/12/2016   Contusion of right knee 08/12/2016   Sinusitis, chronic 08/06/2016   Rash and nonspecific skin eruption 04/20/2016   Aortic valve sclerosis 03/12/2015   Contusion of left chest wall 01/09/2015   Fatigue 09/08/2014   Bladder pain 08/19/2014    Increased frequency of urination 08/19/2014   Urinary urgency 08/19/2014   Postherpetic neuralgia 10/02/2013   Impacted cerumen of right ear 07/16/2013   Headache 07/10/2013   Aortic stenosis 04/19/2013   Insomnia 01/24/2013   Cough 08/07/2012   Upper respiratory infection 08/03/2012   Pruritus of skin 03/11/2012   MVP (mitral valve prolapse)    Multiple thyroid nodules 10/04/2011   Herpes zoster 09/07/2011   Essential hypertension, benign 07/27/2011   Atrophic vaginitis    Well adult exam 03/15/2011   Hyperlipemia, mixed 03/15/2011   OAB (overactive bladder) 09/02/2010   Hypothyroidism 03/03/2010   CALF PAIN, LEFT 10/27/2009   Belching 07/21/2009   EAR PAIN 12/19/2008   Allergic rhinitis 12/19/2008   Irritable bowel syndrome 09/04/2008   PERSONAL HX COLONIC POLYPS 09/04/2008   GASTRITIS, CHRONIC 09/03/2008   DUODENITIS, WITHOUT HEMORRHAGE 09/03/2008   TIBIALIS TENDINITIS 03/20/2008   Osteoarthritis 02/25/2008   SWEATING 02/21/2008   LACTOSE INTOLERANCE 11/20/2007   Dyslipidemia 11/20/2007   GERD 11/20/2007   Osteoporosis 11/20/2007    REFERRING PROVIDER:  Enid Baas, MD    REFERRING DIAG:  [phone] (ICD-10-CM) - Right hip pain  M53.3 (ICD-10-CM) - Pain of right sacroiliac joint    Rationale for Evaluation and Treatment: Rehabilitation  THERAPY DIAG:  Right hip pain  ONSET DATE: months ago started at lumbar spine and worked its way to toes   SUBJECTIVE:                                                                                                                                                                                           SUBJECTIVE STATEMENT: Pt reports some LE weakness since epidural injection, but is slowly improving. No pain at entry. Arrives with [facility]. Complaint with HEP daily. Denies recent falls.  PERTINENT HISTORY:  Osteoperosis, h/o cervical fx, chronic SIJ pain, poor balance  PAIN:  Are you having pain? No  PRECAUTIONS:   Fall  WEIGHT BEARING RESTRICTIONS:  No  FALLS:  Has patient fallen in last 6 months? Yes. Number of falls 1  LIVING ENVIRONMENT: Lives with: lives alone   OCCUPATION:  retired  PLOF:  Independent  PATIENT GOALS:  Improve balance, decr leg heaviness   OBJECTIVE:   DIAGNOSTIC FINDINGS:  DG 02/01/23 Lumbar:  Grade 1 anterolisthesis of L4 versus L5. Grade 1 retrolisthesis of L1 versus L2 and L2 versus L3. No other malalignment. Multilevel degenerative disc disease with loss of disc height at multiple levels and vacuum disc phenomena at multiple levels. Severe lower lumbar facet degenerative changes are noted. Calcified atherosclerotic changes in the abdominal aorta and iliac vessels. Hip:. Visualized pelvic ring is intact. Degenerative changes of the right hip joint are noted. No acute fracture or dislocation is seen. No soft tissue abnormality is noted.   PATIENT SURVEYS:  FOTO 59  COGNITIVE STATUS: Within functional limits for tasks assessed   RED FLAGS: None    SENSATION: WFL   GAIT: Assistive device utilized: Environmental consultant - 4 wheeled Level of assistance: Modified independence Comments: Rt trendelenburg   Body Part #1 Lumbar  Lumbar ROM LUMBAR ROM:   WFL at eval  LE MMT LOWER EXTREMITY MMT:    MMT (lb) Right eval Left eval  Hip flexion 19.3 16.6  Hip extension    Hip abduction 5.1 11.5  Hip adduction    Hip internal rotation    Hip external rotation    Knee flexion 16.7 13.3  Knee extension 17.2 12.3  Ankle dorsiflexion    Ankle plantarflexion    Ankle inversion    Ankle eversion     (Blank rows = not tested)   BERG: 05/16/23 (see flowsheet for full test): 40/56  TREATMENT:                                                                                                                               DATE: 6/24 -Bridge in DF 2x10 Clam in sidelying 2x10ea Sidelying hip circles 2x10ea Lateral weight shift with OH reach Side stepping at rail  x2 laps Partial squats 2x10 Modified SLS with L toes on 2" box with cues to avoid trendelenberg 2x30sec Heel raises 2x10 Tandem balance 20sec each, SBA Step ups 4" step x15ea    DATE:  EVAL 05/16/23  Bridge in DF Clam Sidelying hip circles Lateral weight shift with OH reach   PATIENT EDUCATION:  Education details: Teacher, music of condition, POC, HEP, exercise form/rationale Person educated: Patient Education method: Programmer, multimedia, Demonstration, Tactile cues, Verbal cues, and Handouts Education comprehension: verbalized understanding, returned demonstration, verbal cues required, tactile cues required, and needs further education  HOME EXERCISE PROGRAM: [email or web address]   ASSESSMENT:  CLINICAL IMPRESSION: Reviewed HEP for proper understanding of exercises. Weakness observed with hip circles with cues required for proper pelvic positioning. Worked on reduction of trendelenburg and increased awareness of this. Greater difficulty with step ups on R LE vs L. Pt will continue to benefit from strengthening to meet functional goals.  OBJECTIVE IMPAIRMENTS: decreased activity tolerance, decreased balance, difficulty walking, decreased ROM, decreased strength, increased muscle spasms, improper body mechanics, postural dysfunction, and pain.   ACTIVITY LIMITATIONS: carrying, lifting, bending, standing, squatting, stairs, and locomotion level  PARTICIPATION LIMITATIONS: shopping and community activity  PERSONAL FACTORS:  see PMH  are also affecting patient's functional outcome.   REHAB POTENTIAL: Good  CLINICAL DECISION MAKING: Evolving/moderate complexity  EVALUATION COMPLEXITY: Moderate   GOALS: Goals reviewed with patient? Yes  SHORT TERM GOALS: Target date: 06/04/23  Able to demo good contraction of hip abductors in CKC Baseline: Goal status: INITIAL  2.  Independent with HEP as it has been established Baseline:  Goal status: INITIAL    LONG TERM GOALS: Target date: POC  date  Meet FOTO goal Baseline:  Goal status: INITIAL  2.  Resolution of trendelenburg  Baseline:  Goal status: INITIAL  3.  BERG to improve by MDC Baseline:  Goal status: INITIAL  4.  Navigate stairs step over step and with minimal to no discomfort Baseline:  Goal status: INITIAL    PLAN:  PT FREQUENCY: 1-2x/week  PT DURATION: 8 weeks  PLANNED INTERVENTIONS: Therapeutic exercises, Therapeutic activity, Neuromuscular re-education, Balance training, Gait training, Patient/Family education, Self Care, Joint mobilization, Stair training, Aquatic Therapy, Dry Needling, Electrical stimulation, Spinal mobilization, Cryotherapy, Moist heat, Taping, Traction, Ultrasound, Ionotophoresis 4mg /ml Dexamethasone, Manual therapy, and Re-evaluation.  PLAN FOR NEXT SESSION: continue to progress lumbopelvic strength  Riki Altes, PTA  05/23/23 3:06 PM

## 2023-05-24 NOTE — Telephone Encounter (Signed)
Deductible:  $240, met $240  OOP MAX: Deductible met  Annual exam: 09/29/2022 -JJ  Calcium:   9.0         Date: 04/01/23  Upcoming dental procedures:   Hx of Kidney Disease: No   Last Bone Density Scan: 11/02/2022  Is Prior Authorization needed: no, not required  Pt estimated Cost: $0   Spoke with patient. Patient states she is in a meeting, has upcoming dental procedure, will return call to further discuss prior to scheduling.

## 2023-05-25 ENCOUNTER — Encounter (HOSPITAL_BASED_OUTPATIENT_CLINIC_OR_DEPARTMENT_OTHER): Payer: Medicare Other | Admitting: Physician Assistant

## 2023-05-25 ENCOUNTER — Ambulatory Visit: Payer: Medicare Other | Admitting: Internal Medicine

## 2023-05-26 ENCOUNTER — Ambulatory Visit (INDEPENDENT_AMBULATORY_CARE_PROVIDER_SITE_OTHER): Payer: Medicare Other | Admitting: Sports Medicine

## 2023-05-26 VITALS — BP 128/80

## 2023-05-26 DIAGNOSIS — M5136 Other intervertebral disc degeneration, lumbar region: Secondary | ICD-10-CM

## 2023-05-26 NOTE — Assessment & Plan Note (Signed)
She has had significant relief from her last epidural injection We will try to get her in a regular physical therapy program No changes in medications today Encouraged to continue walking with a cane for safety Do short walks but keep up her activity level Recheck with me in the next 1 to 2 months

## 2023-05-26 NOTE — Patient Instructions (Addendum)
Stop the previous exercises you were given and start these new ones.  -Hip swings to the front and back x 8 -Hip side swings x 8  -Hip circles x 5  - Back - flex forward to 20 degrees/comfort level x 8 - Back extension a few degrees/comfort level x 5  - Side bend each side x 8 - Trunk rotation x 5   Short walks with cane if needed.

## 2023-05-26 NOTE — Progress Notes (Signed)
Chief complaint chronic low back pain  Patient was sent to the neurosurgeon Dr. Jake Samples after her last visit in May. He arranged for her to have an injection I think at L4-5 This is helped substantially and she has been walking better since 1 June She states she can walk with just a cane now In her room she is walking without any assistance  She also went to the physical therapy at [facility] and after 2 sessions feels that she got a lot of benefit She is doing some the home exercises that I gave her for her hip motion and for low back motion  Physical exam Elderly white female in no acute distress BP 128/80   Patient has minimal tenderness to any palpation over her lumbar spine today SI joints are nontender She is able to do 20 degrees of lumbar flexion without pain She can do lateral bending and rotation of her lumbar spine Extension more than 10 degrees is somewhat painful  Walking gait with a cane looks very stable Without the cane she favors her right leg somewhat but did not appear unstable for walking short distances

## 2023-05-30 ENCOUNTER — Ambulatory Visit (HOSPITAL_BASED_OUTPATIENT_CLINIC_OR_DEPARTMENT_OTHER): Payer: Medicare Other | Attending: Sports Medicine

## 2023-05-30 ENCOUNTER — Encounter (HOSPITAL_BASED_OUTPATIENT_CLINIC_OR_DEPARTMENT_OTHER): Payer: Self-pay

## 2023-05-30 DIAGNOSIS — R350 Frequency of micturition: Secondary | ICD-10-CM | POA: Diagnosis not present

## 2023-05-30 DIAGNOSIS — M25551 Pain in right hip: Secondary | ICD-10-CM | POA: Diagnosis not present

## 2023-05-30 DIAGNOSIS — R3915 Urgency of urination: Secondary | ICD-10-CM | POA: Diagnosis not present

## 2023-05-30 DIAGNOSIS — M5416 Radiculopathy, lumbar region: Secondary | ICD-10-CM | POA: Diagnosis not present

## 2023-05-30 DIAGNOSIS — N3281 Overactive bladder: Secondary | ICD-10-CM | POA: Diagnosis not present

## 2023-05-30 DIAGNOSIS — R3989 Other symptoms and signs involving the genitourinary system: Secondary | ICD-10-CM | POA: Diagnosis not present

## 2023-05-30 NOTE — Therapy (Signed)
OUTPATIENT PHYSICAL THERAPY TREATMENT   Patient Name: [name] MRN: [id number] DOB:1936/01/22, 87 y.o., female Today's Date: 05/30/2023  END OF SESSION:  PT End of Session - 05/30/23 1558     Visit Number 3    Number of Visits 9    Date for PT Re-Evaluation 07/16/23    PT Start Time 1433    PT Stop Time 1517    PT Time Calculation (min) 44 min    Equipment Utilized During Treatment Gait belt    Activity Tolerance Patient tolerated treatment well    Behavior During Therapy WFL for tasks assessed/performed               Past Medical History:  Diagnosis Date   Allergic rhinitis    Asthma    Atrophic vaginitis    Baker's cyst    Left-Dr. Aluisio   Cataract    Dr. Dione Booze   CHF (congestive heart failure) (HCC)    Colon polyps    Fibroid    Gastritis    chronic   GERD (gastroesophageal reflux disease)    Heart murmur    Hemorrhoids    Hyperlipidemia    Hypothyroidism    IBS (irritable bowel syndrome)    Lumbar spondylosis    MVP (mitral valve prolapse)    Antibiotics required for dental procedures   OA (osteoarthritis)    Osteoporosis 06/2018   T score -2.2 stable on Prolia   Overactive bladder    Sacroiliitis (HCC)    Thyroid disease    Thyroid nodule    Torn meniscus    bilateral   Past Surgical History:  Procedure Laterality Date   CATARACT EXTRACTION, BILATERAL     EYE SURGERY     POSTERIOR CERVICAL FUSION/FORAMINOTOMY N/A 05/15/2021   Procedure: Cervical One Laminectomy with resection of cyst, Fixation from Occiput to Cervical Four;  Surgeon: [name], MD;  Location: [facility] OR;  Service: Neurosurgery;  Laterality: N/A;   TONSILLECTOMY     Patient Active Problem List   Diagnosis Date Noted   Chalazion left upper eyelid 04/18/2023   Bruising 04/06/2023   Leg wound, right, initial encounter 01/17/2023   Pain of right sacroiliac joint 01/14/2023   Lumbar degenerative disc disease 12/21/2022   Chronic left SI joint pain 11/30/2022   Inflammation  of sacroiliac joint (HCC) 11/16/2022   Stool incontinence 11/16/2022   Memory impairment 11/08/2022   Grief 10/06/2022   History of basal cell carcinoma 09/29/2022   Open forehead wound 07/21/2022   Stress at home 07/21/2022   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   Gait disorder 02/28/2022   History of malignant neoplasm of skin 02/22/2022   Melanocytic nevi of trunk 02/22/2022   Seborrheic dermatitis 02/22/2022   Urticaria 02/22/2022   Irregular bowel habits 11/18/2021   Scalp laceration, sequela 11/02/2021   Concussion 10/29/2021   Scalp itch 08/19/2021   Allergic rhinitis due to animal (cat) (dog) hair and dander 08/07/2021   Allergic rhinitis due to pollen 08/07/2021   Mild intermittent asthma 08/07/2021   Closed fracture of first cervical vertebra (HCC) 08/07/2021   Closed fracture of second cervical vertebra (HCC) 08/07/2021   Cervical spine instability 05/15/2021   Closed 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 05/13/2021   Cervical spinal cord compression (HCC) 05/04/2021   Fall against object 03/25/2021   Hematoma of face, initial encounter 03/25/2021   Degenerative disc disease, cervical 03/11/2021   Spondylolisthesis, cervical region 03/11/2021   Trochanteric bursitis of right hip 09/22/2020  Elevated blood-pressure reading, without diagnosis of hypertension 07/14/2020   Meningioma (HCC) 03/25/2020   Arthralgia 03/18/2020   RUQ pain 01/02/2020   Cholelithiasis 09/09/2019   Ear pain, bilateral 03/21/2019   Cervical spondylosis 01/05/2019   Neck pain 12/26/2018   Acute pain of left shoulder 12/26/2018   Anxiety 08/22/2018   Contact dermatitis and eczema 06/14/2018   Constipation 11/11/2016   Low back pain 11/10/2016   Leg abrasion 08/12/2016   Contusion of right knee 08/12/2016   Sinusitis, chronic 08/06/2016   Rash and nonspecific skin eruption 04/20/2016   Aortic valve sclerosis 03/12/2015   Contusion of left chest wall 01/09/2015    Fatigue 09/08/2014   Bladder pain 08/19/2014   Increased frequency of urination 08/19/2014   Urinary urgency 08/19/2014   Postherpetic neuralgia 10/02/2013   Impacted cerumen of right ear 07/16/2013   Headache 07/10/2013   Aortic stenosis 04/19/2013   Insomnia 01/24/2013   Cough 08/07/2012   Upper respiratory infection 08/03/2012   Pruritus of skin 03/11/2012   MVP (mitral valve prolapse)    Multiple thyroid nodules 10/04/2011   Herpes zoster 09/07/2011   Essential hypertension, benign 07/27/2011   Atrophic vaginitis    Well adult exam 03/15/2011   Hyperlipemia, mixed 03/15/2011   OAB (overactive bladder) 09/02/2010   Hypothyroidism 03/03/2010   CALF PAIN, LEFT 10/27/2009   Belching 07/21/2009   EAR PAIN 12/19/2008   Allergic rhinitis 12/19/2008   Irritable bowel syndrome 09/04/2008   PERSONAL HX COLONIC POLYPS 09/04/2008   GASTRITIS, CHRONIC 09/03/2008   DUODENITIS, WITHOUT HEMORRHAGE 09/03/2008   TIBIALIS TENDINITIS 03/20/2008   Osteoarthritis 02/25/2008   SWEATING 02/21/2008   LACTOSE INTOLERANCE 11/20/2007   Dyslipidemia 11/20/2007   GERD 11/20/2007   Osteoporosis 11/20/2007    REFERRING PROVIDER:  Enid Baas, MD    REFERRING DIAG:  [id number] (ICD-10-CM) - Right hip pain  M53.3 (ICD-10-CM) - Pain of right sacroiliac joint    Rationale for Evaluation and Treatment: Rehabilitation  THERAPY DIAG:  Right hip pain  ONSET DATE: months ago started at lumbar spine and worked its way to toes   SUBJECTIVE:                                                                                                                                                                                           SUBJECTIVE STATEMENT: Pt arrives with rolling walker and brings SPC. She reports no pain at entry. When questioned, she admitted she had a near fall 2 days ago while outdoors, but her housekeeper was able to catch her before she fell to the ground. She denies any  improvement/worsening in her balance.  PERTINENT HISTORY:  Osteoperosis, h/o cervical fx, chronic SIJ pain, poor balance  PAIN:  Are you having pain? No  PRECAUTIONS:  Fall  WEIGHT BEARING RESTRICTIONS:  No  FALLS:  Has patient fallen in last 6 months? Yes. Number of falls 1  LIVING ENVIRONMENT: Lives with: lives alone   OCCUPATION:  retired  PLOF:  Independent  PATIENT GOALS:  Improve balance, decr leg heaviness   OBJECTIVE:   DIAGNOSTIC FINDINGS:  DG 02/01/23 Lumbar:  Grade 1 anterolisthesis of L4 versus L5. Grade 1 retrolisthesis of L1 versus L2 and L2 versus L3. No other malalignment. Multilevel degenerative disc disease with loss of disc height at multiple levels and vacuum disc phenomena at multiple levels. Severe lower lumbar facet degenerative changes are noted. Calcified atherosclerotic changes in the abdominal aorta and iliac vessels. Hip:. Visualized pelvic ring is intact. Degenerative changes of the right hip joint are noted. No acute fracture or dislocation is seen. No soft tissue abnormality is noted.   PATIENT SURVEYS:  FOTO 59  COGNITIVE STATUS: Within functional limits for tasks assessed   RED FLAGS: None    SENSATION: WFL   GAIT: Assistive device utilized: Environmental consultant - 4 wheeled Level of assistance: Modified independence Comments: Rt trendelenburg   Body Part #1 Lumbar  Lumbar ROM LUMBAR ROM:   WFL at eval  LE MMT LOWER EXTREMITY MMT:    MMT (lb) Right eval Left eval  Hip flexion 19.3 16.6  Hip extension    Hip abduction 5.1 11.5  Hip adduction    Hip internal rotation    Hip external rotation    Knee flexion 16.7 13.3  Knee extension 17.2 12.3  Ankle dorsiflexion    Ankle plantarflexion    Ankle inversion    Ankle eversion     (Blank rows = not tested)   BERG: 05/16/23 (see flowsheet for full test): 40/56  TREATMENT:                                                                                                                                 DATE: 7/1 -Bridge in DF 2x10 Supine SLR 2x10 R Clam in sidelying 3x10R Sidelying hip abduction 3x10 R LAQ 2.5# 5" 2x10ea (increase wgt next time) Seated marching 2.5lbs x20ea  Side stepping at rail x2 laps Partial squats 2x10 Modified SLS with L toes on 2" box with cues to avoid trendelenberg 2x30sec Heel raises 2x10 Tandem balance 20sec each, SBA Step ups 4" step [id number]   DATE: 6/24 -Bridge in DF 2x10 Clam in sidelying 2x10ea Sidelying hip circles 2x10ea Lateral weight shift with OH reach Side stepping at rail x2 laps Partial squats 2x10 Modified SLS with L toes on 2" box with cues to avoid trendelenberg 2x30sec Heel raises 2x10 Tandem balance 20sec each, SBA Step ups 4" step x15ea    DATE:  EVAL 05/16/23  Bridge in DF Clam Sidelying hip circles Lateral weight shift with OH reach  PATIENT EDUCATION:  Education details: Teacher, music of condition, POC, HEP, exercise form/rationale Person educated: Patient Education method: Explanation, Demonstration, Tactile cues, Verbal cues, and Handouts Education comprehension: verbalized understanding, returned demonstration, verbal cues required, tactile cues required, and needs further education  HOME EXERCISE PROGRAM: [email or web address]   ASSESSMENT:  CLINICAL IMPRESSION: Printed and reviewed HEP again with pt as she did not recall previous handout. Worked on static balance interventions with SBA and CGA required. She was able to complete step ups at 6" step without complaint, though mild R hip drop evident. Pt will continue to benefit from strengthening and balance.    OBJECTIVE IMPAIRMENTS: decreased activity tolerance, decreased balance, difficulty walking, decreased ROM, decreased strength, increased muscle spasms, improper body mechanics, postural dysfunction, and pain.   ACTIVITY LIMITATIONS: carrying, lifting, bending, standing, squatting, stairs, and locomotion level  PARTICIPATION LIMITATIONS:  shopping and community activity  PERSONAL FACTORS:  see PMH  are also affecting patient's functional outcome.   REHAB POTENTIAL: Good  CLINICAL DECISION MAKING: Evolving/moderate complexity  EVALUATION COMPLEXITY: Moderate   GOALS: Goals reviewed with patient? Yes  SHORT TERM GOALS: Target date: 06/04/23  Able to demo good contraction of hip abductors in CKC Baseline: Goal status: INITIAL  2.  Independent with HEP as it has been established Baseline:  Goal status: INITIAL    LONG TERM GOALS: Target date: POC date  Meet FOTO goal Baseline:  Goal status: INITIAL  2.  Resolution of trendelenburg  Baseline:  Goal status: INITIAL  3.  BERG to improve by MDC Baseline:  Goal status: INITIAL  4.  Navigate stairs step over step and with minimal to no discomfort Baseline:  Goal status: INITIAL    PLAN:  PT FREQUENCY: 1-2x/week  PT DURATION: 8 weeks  PLANNED INTERVENTIONS: Therapeutic exercises, Therapeutic activity, Neuromuscular re-education, Balance training, Gait training, Patient/Family education, Self Care, Joint mobilization, Stair training, Aquatic Therapy, Dry Needling, Electrical stimulation, Spinal mobilization, Cryotherapy, Moist heat, Taping, Traction, Ultrasound, Ionotophoresis 4mg /ml Dexamethasone, Manual therapy, and Re-evaluation.  PLAN FOR NEXT SESSION: continue to progress lumbopelvic strength  Riki Altes, PTA  05/30/23 4:30 PM

## 2023-05-31 NOTE — Telephone Encounter (Signed)
Left message to call Medard Decuir, RN at [facility], [phone], option 5.  

## 2023-06-01 ENCOUNTER — Encounter (HOSPITAL_BASED_OUTPATIENT_CLINIC_OR_DEPARTMENT_OTHER): Payer: Medicare Other | Attending: Physician Assistant | Admitting: Physician Assistant

## 2023-06-01 DIAGNOSIS — L97822 Non-pressure chronic ulcer of other part of left lower leg with fat layer exposed: Secondary | ICD-10-CM | POA: Diagnosis not present

## 2023-06-01 DIAGNOSIS — S80812A Abrasion, left lower leg, initial encounter: Secondary | ICD-10-CM | POA: Insufficient documentation

## 2023-06-01 DIAGNOSIS — L97811 Non-pressure chronic ulcer of other part of right lower leg limited to breakdown of skin: Secondary | ICD-10-CM | POA: Insufficient documentation

## 2023-06-01 DIAGNOSIS — I87311 Chronic venous hypertension (idiopathic) with ulcer of right lower extremity: Secondary | ICD-10-CM | POA: Insufficient documentation

## 2023-06-01 DIAGNOSIS — X58XXXA Exposure to other specified factors, initial encounter: Secondary | ICD-10-CM | POA: Diagnosis not present

## 2023-06-01 DIAGNOSIS — S80811D Abrasion, right lower leg, subsequent encounter: Secondary | ICD-10-CM | POA: Insufficient documentation

## 2023-06-01 NOTE — Progress Notes (Signed)
[name], [name] ([id number]) [id number] Page 1 of 6 Visit Report for 06/01/2023 Arrival Information Details Patient Name: Date of Service: [name], [name] 06/01/2023 2:15 PM Medical Record Number: [id number] Patient Account Number: [id number] Date of Birth/Sex: Treating RN: 1936/02/06 (87 y.o. F) Primary Care Ranay Ketter: [name] Other Clinician: Referring Samanatha Brammer: Treating Sesilia Poucher/Extender: [name] in Treatment: 17 Visit Information History Since Last Visit Added or deleted any medications: [name] Patient Arrived: Cane Any new allergies or adverse reactions: [name] Arrival Time: 14:06 Had a fall or experienced change in [name] Accompanied By: self activities of daily living that may affect Transfer Assistance: None risk of falls: Patient Identification Verified: Yes Signs or symptoms of abuse/neglect since last visito [name] Secondary Verification Process Completed: Yes Hospitalized since last visit: [name] Patient Requires Transmission-Based Precautions: [name] Implantable device outside of the clinic excluding [name] Patient Has Alerts: [name] cellular tissue based products placed in the center since last visit: Has Dressing in Place as Prescribed: Yes Pain Present Now: [name] Electronic Signature(s) Signed: 06/01/2023 3:45:00 PM By: Karl Ito Entered By: Karl Ito on 06/01/2023 14:06:42 -------------------------------------------------------------------------------- Clinic Level of Care Assessment Details Patient Name: Date of Service: [name], [name] 06/01/2023 2:15 PM Medical Record Number: [id number] Patient Account Number: [id number] Date of Birth/Sex: Treating RN: 10/02/1936 (87 y.o. [name] Primary Care Hazely Sealey: [name] Other Clinician: Referring Naudia Crosley: Treating Quinn Quam/Extender: [name] in Treatment: 17 Clinic Level of Care Assessment Items TOOL 4 Quantity  Score X- 1 0 Use when only an EandM is performed on FOLLOW-UP visit ASSESSMENTS - Nursing Assessment / Reassessment X- 1 10 Reassessment of [name]-morbidities (includes updates in patient status) X- 1 5 Reassessment of Adherence to Treatment Plan ASSESSMENTS - Wound and Skin A ssessment / Reassessment X - Simple Wound Assessment / Reassessment - one wound 1 5 []  - 0 Complex Wound Assessment / Reassessment - multiple wounds X- 1 10 Dermatologic / Skin Assessment (not related to wound area) ASSESSMENTS - Focused Assessment []  - 0 Circumferential Edema Measurements - multi extremities []  - 0 Nutritional Assessment / Counseling / Intervention NOORAH, LUGER ([id number]) [id number] Page 2 of 6 []  - 0 Lower Extremity Assessment (monofilament, tuning fork, pulses) []  - 0 Peripheral Arterial Disease Assessment (using hand held doppler) ASSESSMENTS - Ostomy and/or Continence Assessment and Care []  - 0 Incontinence Assessment and Management []  - 0 Ostomy Care Assessment and Management (repouching, etc.) PROCESS - Coordination of Care X - Simple Patient / Family Education for ongoing care 1 15 []  - 0 Complex (extensive) Patient / Family Education for ongoing care X- 1 10 Staff obtains Chiropractor, Records, T Results / Process Orders est []  - 0 Staff telephones HHA, Nursing Homes / Clarify orders / etc []  - 0 Routine Transfer to another Facility (non-emergent condition) []  - 0 Routine Hospital Admission (non-emergent condition) []  - 0 New Admissions / Manufacturing engineer / Ordering NPWT Apligraf, etc. , []  - 0 Emergency Hospital Admission (emergent condition) X- 1 10 Simple Discharge Coordination []  - 0 Complex (extensive) Discharge Coordination PROCESS - Special Needs []  - 0 Pediatric / Minor Patient Management []  - 0 Isolation Patient Management []  - 0 Hearing / Language / Visual special needs []  - 0 Assessment of Community assistance  (transportation, D/C planning, etc.) []  - 0 Additional assistance / Altered mentation []  - 0 Support Surface(s) Assessment (bed, cushion, seat, etc.) INTERVENTIONS - Wound Cleansing / Measurement X - Simple  Wound Cleansing - one wound 1 5 []  - 0 Complex Wound Cleansing - multiple wounds X- 1 5 Wound Imaging (photographs - any number of wounds) []  - 0 Wound Tracing (instead of photographs) X- 1 5 Simple Wound Measurement - one wound []  - 0 Complex Wound Measurement - multiple wounds INTERVENTIONS - Wound Dressings []  - 0 Small Wound Dressing one or multiple wounds []  - 0 Medium Wound Dressing one or multiple wounds []  - 0 Large Wound Dressing one or multiple wounds []  - 0 Application of Medications - topical []  - 0 Application of Medications - injection INTERVENTIONS - Miscellaneous []  - 0 External ear exam []  - 0 Specimen Collection (cultures, biopsies, blood, body fluids, etc.) []  - 0 Specimen(s) / Culture(s) sent or taken to Lab for analysis []  - 0 Patient Transfer (multiple staff / Nurse, adult / Similar devices) []  - 0 Simple Staple / Suture removal (25 or less) []  - 0 Complex Staple / Suture removal (26 or more) []  - 0 Hypo / Hyperglycemic Management (close monitor of Blood Glucose) KATESHA, PACKETT ([id number]) [phone].pdf Page 3 of 6 []  - 0 Ankle / Brachial Index (ABI) - do not check if billed separately X- 1 5 Vital Signs Has the patient been seen at the hospital within the last three years: Yes Total Score: 85 Level Of Care: New/Established - Level 3 Electronic Signature(s) Signed: 06/01/2023 4:48:32 PM By: Shawn Stall RN, BSN Entered By: Shawn Stall on 06/01/2023 14:20:26 -------------------------------------------------------------------------------- Encounter Discharge Information Details Patient Name: Date of Service: [name], [name]. 06/01/2023 2:15 PM Medical Record Number: [id number] Patient Account Number: [id number] Date  of Birth/Sex: Treating RN: 06/06/1936 (87 y.o. [name] Primary Care Delaila Nand: [name] Other Clinician: Referring Alonda Weaber: Treating Ikran Patman/Extender: [name], [name] in Treatment: 17 Encounter Discharge Information Items Discharge Condition: Stable Ambulatory Status: Cane Discharge Destination: Home Transportation: Private Auto Accompanied By: self Schedule Follow-up Appointment: [name] Clinical Summary of Care: Electronic Signature(s) Signed: 06/01/2023 4:48:32 PM By: Shawn Stall RN, BSN Entered By: Shawn Stall on 06/01/2023 14:20:47 -------------------------------------------------------------------------------- Multi-Disciplinary Care Plan Details Patient Name: Date of Service: [name], [name]. 06/01/2023 2:15 PM Medical Record Number: [id number] Patient Account Number: [id number] Date of Birth/Sex: Treating RN: 1936/06/19 (87 y.o. [name] Primary Care Noelle Sease: [name] Other Clinician: Referring Tomeka Kantner: Treating Mazzy Santarelli/Extender: [name], [name] in Treatment: 17 Active Inactive Electronic Signature(s) Signed: 06/01/2023 4:48:32 PM By: Shawn Stall RN, BSN Entered By: Shawn Stall on 06/01/2023 14:19:55 [name] ([id number]) [id number] Page 4 of 6 -------------------------------------------------------------------------------- Pain Assessment Details Patient Name: Date of Service: [name], [name] 06/01/2023 2:15 PM Medical Record Number: [id number] Patient Account Number: [id number] Date of Birth/Sex: Treating RN: [birth year] (87 y.o. F) Primary Care Audrey Thull: [name] Other Clinician: Referring Jobany Montellano: Treating Merrit Friesen/Extender: [name] in Treatment: 17 Active Problems Location of Pain Severity and Description of Pain Patient Has Paino [name] Site Locations Pain Management and Medication Current  Pain Management: Electronic Signature(s) Signed: 06/01/2023 3:45:00 PM By: Karl Ito Entered By: Karl Ito on 06/01/2023 14:07:06 -------------------------------------------------------------------------------- Wound Assessment Details Patient Name: Date of Service: [name], [name] 06/01/2023 2:15 PM Medical Record Number: [id number] Patient Account Number: [id number] Date of Birth/Sex: Treating RN: [birth year] (87 y.o. F) Primary Care Drayce Tawil: [name] Other Clinician: Referring Jai Bear: Treating Halie Gass/Extender: [name], Georgina Quint Weeks in Treatment: 17 Wound Status Wound Number: 3  Primary Etiology: Abrasion Wound Location: Left, Anterior Lower Leg Wound Status: Healed - Epithelialized Wounding Event: Trauma Comorbid Cataracts, Asthma, Congestive Heart Failure, History: Osteoarthritis Date Acquired: 04/21/2023 Weeks Of Treatment: 5 Clustered Wound: [name] Photos JENEE, RAZ ([id number]) [id number] Page 5 of 6 Wound Measurements Length: (cm) Width: (cm) Depth: (cm) Area: (cm) Volume: (cm) 0 % Reduction in Area: 100% 0 % Reduction in Volume: 100% 0 Epithelialization: Large (67-100%) 0 Tunneling: [name] 0 Wound Description Classification: Full Thickness Without Exposed Support S Wound Margin: Distinct, outline attached Exudate Amount: None Present tructures Foul Odor After Cleansing: [name] Slough/Fibrino [name] Wound Bed Granulation Amount: None Present (0%) Exposed Structure Necrotic Amount: None Present (0%) Fascia Exposed: [name] Fat Layer (Subcutaneous Tissue) Exposed: [name] Tendon Exposed: [name] Muscle Exposed: [name] Joint Exposed: [name] Bone Exposed: [name] Periwound Skin Texture Texture Color [name] Abnormalities Noted: [name] [name] Abnormalities Noted: [name] Callus: [name] Atrophie Blanche: [name] Crepitus: [name] Cyanosis: [name] Excoriation: [name] Ecchymosis: [name] Induration: [name] Erythema: [name] Rash: [name] Hemosiderin Staining: Yes Scarring:  [name] Mottled: [name] Pallor: [name] Moisture Rubor: [name] [name] Abnormalities Noted: Yes Temperature / Pain Temperature: [name] Abnormality Electronic Signature(s) Signed: 06/01/2023 4:48:32 PM By: Shawn Stall RN, BSN Entered By: Shawn Stall on 06/01/2023 14:18:17 -------------------------------------------------------------------------------- Vitals Details Patient Name: Date of Service: [name], Joanny [name]. 06/01/2023 2:15 PM Medical Record Number: [id number] Patient Account Number: [id number] Date of Birth/Sex: Treating RN: 11/15/1936 (87 y.o. F) Primary Care Robbye Dede: [name] Other Clinician: Referring Ayiden Milliman: Treating Citlally Captain/Extender: [name], [name] in Treatment: 17 Vital Signs Time Taken: 14:06 Temperature (F): 98.0 Height (in): 61 Pulse (bpm): 93 [name], [name] ([id number]) [phone].pdf Page 6 of 6 Weight (lbs): 113 Respiratory Rate (breaths/min): 16 Body Mass Index (BMI): 21.3 Blood Pressure (mmHg): 130/77 Reference Range: 80 - 120 mg / dl Electronic Signature(s) Signed: 06/01/2023 3:45:00 PM By: Karl Ito Entered By: Karl Ito on 06/01/2023 14:06:58

## 2023-06-01 NOTE — Progress Notes (Signed)
JUANIKA, ZAHM ([id number]) [id number] Page 1 of 6 Visit Report for 06/01/2023 Chief Complaint Document Details Patient Name: Date of Service: [name], [name] 06/01/2023 2:15 PM Medical Record Number: [id number] Patient Account Number: [id number] Date of Birth/Sex: Treating RN: June 18, 1936 (87 y.o. F) Primary Care Provider: [name] Other Clinician: Referring Provider: Treating Provider/Extender: [name], [name] in Treatment: 17 Information Obtained from: Patient Chief Complaint Bilateral LE Ulcers Electronic Signature(s) Signed: 06/01/2023 2:31:45 PM By: [name] PA-C Entered By: [name] on 06/01/2023 14:31:45 -------------------------------------------------------------------------------- HPI Details Patient Name: Date of Service: [name], [name] 06/01/2023 2:15 PM Medical Record Number: [id number] Patient Account Number: [id number] Date of Birth/Sex: Treating RN: [birth year] (87 y.o. F) Primary Care Provider: [name] Other Clinician: Referring Provider: Treating Provider/Extender: [name] in Treatment: 17 History of Present Illness HPI Description: ADMISSION 02/02/23 Mrs. Otteson is an 87 year old woman who lives in the independent [facility] the [facility]. She has been dealing with a leg wound since February 19 when she traumatized her right leg on a cleaning bucket. She has been using Bactroban and T and an Ace wrap. elfa Chronic low back pain, right hip pain, osteoporosis, hypertension, gait disease disorder she is not a diabetic. 02-16-2023 upon evaluation today patient appears to be doing excellent in regard to her wound although it does look a little bit dry overall I think we are still seeing some improvements here which is great news. Fortunately I do not see any evidence of active infection locally nor systemically which is great news. No  fevers, chills, nausea, vomiting, or diarrhea. 02-23-2023 upon evaluation today patient appears to be doing well currently in regard to her wounds. She actually is doing excellent and in fact the more medial portion of the wound is almost completely closed the lateral portion is much smaller I am very pleased with what we are seeing. 03-02-2023 upon evaluation today patient appears to be doing well currently in regard to her wound was actually showing signs of excellent improvement. Fortunately I do not see any evidence of active infection locally nor systemically which is great news and overall I am extremely pleased with where things stand. There is no sign of infection. No fevers, chills, nausea, vomiting, or diarrhea. 03-09-2023 upon evaluation today patient appears to be doing well currently in regard to her wound. This is actually showing signs of improvement I am actually very pleased with where we stand I think she is continuing to move in the right direction. She is very pleased and happy to hear this. 03-16-2023 upon evaluation today patient appears to be doing well with regard to her wounds visually and I do feel like she is making good progress here. Fortunately there does not appear to be any signs of active infection locally nor systemically which is great news. No fevers, chills, nausea, vomiting, or diarrhea. 03-30-2023 upon evaluation today patient appears to be doing well currently in regard to her wound. She is actually been tolerating the dressing changes without complication. Fortunately I do not see any signs of active infection locally nor systemically which is great news and I think that she is moving in the right direction. 04-06-2023 upon evaluation today patient actually appears to be doing great in regard to her wound she has been tolerating the dressing changes without AMORY, MARCELLINO ([id number]) [id number] Page 2 of 6 complication the collagen seems to  have done very well  over the past week for her this is great news. 04-13-2023 upon evaluation today patient appears to be doing well currently in regard to her wound. She in fact does not show any signs of infection but unfortunately does not look quite as healed as what we even saw last week. I am a little concerned that the dressing did not agree with her. We reason the collagen under the Xeroform I think it might have been just a little bit too wet. With that being said I am going to suggest based on what we are seeing that we go ahead and have the patient continue with the recommendation for a new dressing and will go ahead and make that switch they will be detailed in the plan. 04-20-2023 upon evaluation today patient appears to be doing well currently in regard to her wound which actually is showing signs of not being completely healed but getting very close. Fortunately I do not see any evidence of active infection locally or systemically which is great news. 04-27-2023 upon evaluation today patient appears to be doing well currently in regard to her right leg which actually appears to be completely healed. In regard to the left leg unfortunately she has new wounds that are showing up here on this left side and this is due to initially having she feels like to get this on something and then subsequently after that she ended up having an area that pulled off with the Band-Aid she put on I told her she does not need to be using standard Band-Aids as her skin is much too fragile. She voiced understanding. I did give her options for the next care do a bandages which I think would be a much better option and are much safer for her. Obviously right now using the bordered foam for the areas that we are treating regularly to treat this left side as well. 05-04-2023 upon evaluation patient's right leg is completely healed left leg below doing a little bit better does look like it require some sharp debridement  today to clearway the necrotic debris I discussed that with her at this point I do believe that we should be able to get this cleared up quite nicely. Fortunately I do not see any signs of active infection locally nor systemically which is great news. 05-11-2023 upon evaluation today patient appears to be doing well currently in regard to her wounds on the left leg the right leg is already showing signs of being completely healed which is great news. Fortunately I do not see any signs of active infection locally or systemically which is great news. 05-18-2023 upon evaluation today patient appears to be doing well currently in regard to her wound which is almost healed and actually appears to be doing excellent. I am actually very pleased with where we stand today. I do not see any signs of infection. 06-01-2023 upon evaluation today patient appears to be doing well currently in regard to her wound area on the left leg which is now healed the right leg is still healed as well. Fortunately there does not appear to be any signs of active infection locally or systemically which is great news. No fevers, chills, nausea, vomiting, or diarrhea. Electronic Signature(s) Signed: 06/01/2023 2:32:06 PM By: [name] PA-C Entered By: [name] on 06/01/2023 14:32:06 -------------------------------------------------------------------------------- Physical Exam Details Patient Name: Date of Service: [name], Dickie H. 06/01/2023 2:15 PM Medical Record Number: [id number] Patient Account Number: [id number] Date of Birth/Sex: Treating RN:  1936/06/27 (87 y.o. F) Primary Care Provider: [name] Other Clinician: Referring Provider: Treating Provider/Extender: [name], [name] in Treatment: 17 Constitutional Well-nourished and well-hydrated in no acute distress. Respiratory normal breathing without difficulty. Psychiatric this patient is able to make decisions and demonstrates  good insight into disease process. Alert and Oriented x 3. pleasant and cooperative. Notes Upon inspection patient's wound bed actually showed signs of good granulation epithelization at this point. Fortunately I do not see any signs of worsening in fact everything appears to be completely healed which is great news and in general I do think that we have done really well in getting this closed and I am very pleased for her. Electronic Signature(s) Signed: 06/01/2023 2:32:25 PM By: [name] PA-C Entered By: [name] on 06/01/2023 14:32:25 Physician Orders Details -------------------------------------------------------------------------------- [name] ([id number]) [id number] Page 3 of 6 Patient Name: Date of Service: NEILANI, CISNEROZ 06/01/2023 2:15 PM Medical Record Number: [id number] Patient Account Number: [id number] Date of Birth/Sex: Treating RN: May 16, 1936 (87 y.o. [name], [name] Primary Care Provider: [name] Other Clinician: Referring Provider: Treating Provider/Extender: [name] in Treatment: 78 Verbal / Phone Orders: No Diagnosis Coding ICD-10 Coding Code Description L97.811 Non-pressure chronic ulcer of other part of right lower leg limited to breakdown of skin S80.811D Abrasion, right lower leg, subsequent encounter L97.822 Non-pressure chronic ulcer of other part of left lower leg with fat layer exposed S80.812A Abrasion, left lower leg, initial encounter I87.311 Chronic venous hypertension (idiopathic) with ulcer of right lower extremity Discharge From [facility] Discharge from Wound Care Center - Call if any future wound care needs. Edema Control - Lymphedema / SCD / Other Elevate legs to the level of the heart or above for 30 minutes daily and/or when sitting for 3-4 times a day throughout the day. Avoid standing for long periods of time. Exercise regularly Moisturize legs  daily. Electronic Signature(s) Signed: 06/01/2023 3:56:42 PM By: [name] PA-C Signed: 06/01/2023 4:48:32 PM By: Shawn Stall RN, BSN Entered By: Shawn Stall on 06/01/2023 14:19:48 -------------------------------------------------------------------------------- Problem List Details Patient Name: Date of Service: [name], Ketrina H. 06/01/2023 2:15 PM Medical Record Number: [id number] Patient Account Number: [id number] Date of Birth/Sex: Treating RN: Jul 22, 1936 (87 y.o. [name], [name] Primary Care Provider: [name] Other Clinician: Referring Provider: Treating Provider/Extender: [name] in Treatment: 17 Active Problems ICD-10 Encounter Code Description Active Date MDM Diagnosis L97.811 Non-pressure chronic ulcer of other part of right lower leg limited to breakdown 02/02/2023 No Yes of skin S80.811D Abrasion, right lower leg, subsequent encounter 02/02/2023 No Yes L97.822 Non-pressure chronic ulcer of other part of left lower leg with fat layer exposed5/29/2024 No Yes S80.812A Abrasion, left lower leg, initial encounter 04/27/2023 No Yes I87.311 Chronic venous hypertension (idiopathic) with ulcer of right lower extremity 02/02/2023 No Yes MCKINLEIGH, BELD ([id number]) [id number] Page 4 of 6 Inactive Problems Resolved Problems Electronic Signature(s) Signed: 06/01/2023 2:31:14 PM By: [name] PA-C Entered By: [name] on 06/01/2023 14:31:13 -------------------------------------------------------------------------------- Progress Note Details Patient Name: Date of Service: [name], Kadedra H. 06/01/2023 2:15 PM Medical Record Number: [id number] Patient Account Number: [id number] Date of Birth/Sex: Treating RN: July 29, 1936 (87 y.o. F) Primary Care Provider: [name] Other Clinician: Referring Provider: Treating Provider/Extender: [name] in Treatment: 17 Subjective Chief  Complaint Information obtained from Patient Bilateral LE Ulcers History of Present Illness (HPI)  ADMISSION 02/02/23 Mrs. Hasselman is an 87 year old woman who lives in the independent part [facility] the [facility]. She has been dealing with a leg wound since February 19 when she traumatized her right leg on a cleaning bucket. She has been using Bactroban and T and an Ace wrap. elfa Chronic low back pain, right hip pain, osteoporosis, hypertension, gait disease disorder she is not a diabetic. 02-16-2023 upon evaluation today patient appears to be doing excellent in regard to her wound although it does look a little bit dry overall I think we are still seeing some improvements here which is great news. Fortunately I do not see any evidence of active infection locally nor systemically which is great news. No fevers, chills, nausea, vomiting, or diarrhea. 02-23-2023 upon evaluation today patient appears to be doing well currently in regard to her wounds. She actually is doing excellent and in fact the more medial portion of the wound is almost completely closed the lateral portion is much smaller I am very pleased with what we are seeing. 03-02-2023 upon evaluation today patient appears to be doing well currently in regard to her wound was actually showing signs of excellent improvement. Fortunately I do not see any evidence of active infection locally nor systemically which is great news and overall I am extremely pleased with where things stand. There is no sign of infection. No fevers, chills, nausea, vomiting, or diarrhea. 03-09-2023 upon evaluation today patient appears to be doing well currently in regard to her wound. This is actually showing signs of improvement I am actually very pleased with where we stand I think she is continuing to move in the right direction. She is very pleased and happy to hear this. 03-16-2023 upon evaluation today patient appears to be doing well with regard to  her wounds visually and I do feel like she is making good progress here. Fortunately there does not appear to be any signs of active infection locally nor systemically which is great news. No fevers, chills, nausea, vomiting, or diarrhea. 03-30-2023 upon evaluation today patient appears to be doing well currently in regard to her wound. She is actually been tolerating the dressing changes without complication. Fortunately I do not see any signs of active infection locally nor systemically which is great news and I think that she is moving in the right direction. 04-06-2023 upon evaluation today patient actually appears to be doing great in regard to her wound she has been tolerating the dressing changes without complication the collagen seems to have done very well over the past week for her this is great news. 04-13-2023 upon evaluation today patient appears to be doing well currently in regard to her wound. She in fact does not show any signs of infection but unfortunately does not look quite as healed as what we even saw last week. I am a little concerned that the dressing did not agree with her. We reason the collagen under the Xeroform I think it might have been just a little bit too wet. With that being said I am going to suggest based on what we are seeing that we go ahead and have the patient continue with the recommendation for a new dressing and will go ahead and make that switch they will be detailed in the plan. 04-20-2023 upon evaluation today patient appears to be doing well currently in regard to her wound which actually is showing signs of not being completely healed but getting very close. Fortunately I do not see  any evidence of active infection locally or systemically which is great news. 04-27-2023 upon evaluation today patient appears to be doing well currently in regard to her right leg which actually appears to be completely healed. In regard to the left leg unfortunately she has new  wounds that are showing up here on this left side and this is due to initially having she feels like to get this on something and then subsequently after that she ended up having an area that pulled off with the Band-Aid she put on I told her she does not need to be using standard Band-Aids as her skin is much too fragile. She voiced understanding. I did give her options for the next care do a bandages which I think would be a much better option and are much safer for her. Obviously right now using the bordered foam for the areas that we are treating regularly to treat this left side as well. 05-04-2023 upon evaluation patient's right leg is completely healed left leg below doing a little bit better does look like it require some sharp debridement today to clearway the necrotic debris I discussed that with her at this point I do believe that we should be able to get this cleared up quite nicely. Fortunately I do not EMMERY, WETHERALD ([id number]) [id number] Page 5 of 6 see any signs of active infection locally nor systemically which is great news. 05-11-2023 upon evaluation today patient appears to be doing well currently in regard to her wounds on the left leg the right leg is already showing signs of being completely healed which is great news. Fortunately I do not see any signs of active infection locally or systemically which is great news. 05-18-2023 upon evaluation today patient appears to be doing well currently in regard to her wound which is almost healed and actually appears to be doing excellent. I am actually very pleased with where we stand today. I do not see any signs of infection. 06-01-2023 upon evaluation today patient appears to be doing well currently in regard to her wound area on the left leg which is now healed the right leg is still healed as well. Fortunately there does not appear to be any signs of active infection locally or systemically which is great news.  No fevers, chills, nausea, vomiting, or diarrhea. Objective Constitutional Well-nourished and well-hydrated in no acute distress. Vitals Time Taken: 2:06 PM, Height: 61 in, Weight: 113 lbs, BMI: 21.3, Temperature: 98.0 F, Pulse: 93 bpm, Respiratory Rate: 16 breaths/min, Blood Pressure: 130/77 mmHg. Respiratory normal breathing without difficulty. Psychiatric this patient is able to make decisions and demonstrates good insight into disease process. Alert and Oriented x 3. pleasant and cooperative. General Notes: Upon inspection patient's wound bed actually showed signs of good granulation epithelization at this point. Fortunately I do not see any signs of worsening in fact everything appears to be completely healed which is great news and in general I do think that we have done really well in getting this closed and I am very pleased for her. Integumentary (Hair, Skin) Wound #3 status is Healed - Epithelialized. Original cause of wound was Trauma. The date acquired was: 04/21/2023. The wound has been in treatment 5 weeks. The wound is located on the Left,Anterior Lower Leg. The wound measures 0cm length x 0cm width x 0cm depth; 0cm^2 area and 0cm^3 volume. There is no tunneling noted. There is a none present amount of drainage noted. The wound margin is distinct with the  outline attached to the wound base. There is no granulation within the wound bed. There is no necrotic tissue within the wound bed. The periwound skin appearance had no abnormalities noted for moisture. The periwound skin appearance exhibited: Hemosiderin Staining. The periwound skin appearance did not exhibit: Callus, Crepitus, Excoriation, Induration, Rash, Scarring, Atrophie Blanche, Cyanosis, Ecchymosis, Mottled, Pallor, Rubor, Erythema. Periwound temperature was noted as No Abnormality. Assessment Active Problems ICD-10 Non-pressure chronic ulcer of other part of right lower leg limited to breakdown of skin Abrasion,  right lower leg, subsequent encounter Non-pressure chronic ulcer of other part of left lower leg with fat layer exposed Abrasion, left lower leg, initial encounter Chronic venous hypertension (idiopathic) with ulcer of right lower extremity Plan Discharge From [facility] Services: Discharge from Wound Care Center - Call if any future wound care needs. Edema Control - Lymphedema / SCD / Other: Elevate legs to the level of the heart or above for 30 minutes daily and/or when sitting for 3-4 times a day throughout the day. Avoid standing for long periods of time. Exercise regularly Moisturize legs daily. 1. I am recommend that we have the patient continue to monitor for any signs of infection or wors #1 based on what I am seeing I do believe that the patient is completely healed which is great news. Fortunately there does not appear to be any signs of active infection at this time. 2. I would recommend that the patient should continue to use some lotion to the leg to help keep this protected other than that she is really doing quite well. We will see patient back for reevaluation in 1 week here in the clinic. If anything worsens or changes patient will contact our office for additional recommendations. Ient should continue to monitor for any signs of worsening or infection. Overall I do think that we are making excellent KARESSA, DOSHI ([id number]) [id number] Page 6 of 6 Electronic Signature(s) Signed: 06/01/2023 2:33:22 PM By: [name] PA-C Entered By: [name] on 06/01/2023 14:33:22 -------------------------------------------------------------------------------- SuperBill Details Patient Name: Date of Service: [name], Lorenza H. 06/01/2023 Medical Record Number: [id number] Patient Account Number: [id number] Date of Birth/Sex: Treating RN: [birth year] (87 y.o. [name], [name] Primary Care Provider: [name] Other Clinician: Referring Provider: Treating  Provider/Extender: [name] in Treatment: 17 Diagnosis Coding ICD-10 Codes Code Description [phone] Non-pressure chronic ulcer of other part of right lower leg limited to breakdown of skin S80.811D Abrasion, right lower leg, subsequent encounter L97.822 Non-pressure chronic ulcer of other part of left lower leg with fat layer exposed S80.812A Abrasion, left lower leg, initial encounter I87.311 Chronic venous hypertension (idiopathic) with ulcer of right lower extremity Facility Procedures : CPT4 Code: [id number] Description: 99213 - WOUND CARE VISIT-LEV 3 EST PT Modifier: Quantity: 1 Physician Procedures : CPT4 Code Description Modifier [id number] - WC PHYS LEVEL 3 - EST PT ICD-10 Diagnosis Description L97.811 Non-pressure chronic ulcer of other part of right lower leg limited to breakdown of skin S80.811D Abrasion, right lower leg, subsequent  encounter L97.822 Non-pressure chronic ulcer of other part of left lower leg with fat layer exposed S80.812A Abrasion, left lower leg, initial encounter Quantity: 1 Electronic Signature(s) Signed: 06/01/2023 2:33:45 PM By: [name] PA-C Entered By: [name] on 06/01/2023 14:33:45

## 2023-06-03 ENCOUNTER — Ambulatory Visit (HOSPITAL_BASED_OUTPATIENT_CLINIC_OR_DEPARTMENT_OTHER): Payer: Medicare Other

## 2023-06-03 ENCOUNTER — Encounter (HOSPITAL_BASED_OUTPATIENT_CLINIC_OR_DEPARTMENT_OTHER): Payer: Self-pay

## 2023-06-03 DIAGNOSIS — M25551 Pain in right hip: Secondary | ICD-10-CM

## 2023-06-03 NOTE — Therapy (Signed)
OUTPATIENT PHYSICAL THERAPY TREATMENT   Patient Name: [name] MRN: [id number] DOB:1936-08-08, 87 y.o., female Today's Date: 06/03/2023  END OF SESSION:  PT End of Session - 06/03/23 0942     Visit Number 4    Number of Visits 9    Date for PT Re-Evaluation 07/16/23    PT Start Time 0932    PT Stop Time 1015    PT Time Calculation (min) 43 min    Activity Tolerance Patient tolerated treatment well    Behavior During Therapy WFL for tasks assessed/performed                Past Medical History:  Diagnosis Date   Allergic rhinitis    Asthma    Atrophic vaginitis    Baker's cyst    Left-Dr. Aluisio   Cataract    Dr. Dione Booze   CHF (congestive heart failure) (HCC)    Colon polyps    Fibroid    Gastritis    chronic   GERD (gastroesophageal reflux disease)    Heart murmur    Hemorrhoids    Hyperlipidemia    Hypothyroidism    IBS (irritable bowel syndrome)    Lumbar spondylosis    MVP (mitral valve prolapse)    Antibiotics required for dental procedures   OA (osteoarthritis)    Osteoporosis 06/2018   T score -2.2 stable on Prolia   Overactive bladder    Sacroiliitis (HCC)    Thyroid disease    Thyroid nodule    Torn meniscus    bilateral   Past Surgical History:  Procedure Laterality Date   CATARACT EXTRACTION, BILATERAL     EYE SURGERY     POSTERIOR CERVICAL FUSION/FORAMINOTOMY N/A 05/15/2021   Procedure: Cervical One Laminectomy with resection of cyst, Fixation from Occiput to Cervical Four;  Surgeon: [name], MD;  Location: [facility] OR;  Service: Neurosurgery;  Laterality: N/A;   TONSILLECTOMY     Patient Active Problem List   Diagnosis Date Noted   Chalazion left upper eyelid 04/18/2023   Bruising 04/06/2023   Leg wound, right, initial encounter 01/17/2023   Pain of right sacroiliac joint 01/14/2023   Lumbar degenerative disc disease 12/21/2022   Chronic left SI joint pain 11/30/2022   Inflammation of sacroiliac joint (HCC) 11/16/2022   Stool  incontinence 11/16/2022   Memory impairment 11/08/2022   Grief 10/06/2022   History of basal cell carcinoma 09/29/2022   Open forehead wound 07/21/2022   Stress at home 07/21/2022   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   Gait disorder 02/28/2022   History of malignant neoplasm of skin 02/22/2022   Melanocytic nevi of trunk 02/22/2022   Seborrheic dermatitis 02/22/2022   Urticaria 02/22/2022   Irregular bowel habits 11/18/2021   Scalp laceration, sequela 11/02/2021   Concussion 10/29/2021   Scalp itch 08/19/2021   Allergic rhinitis due to animal (cat) (dog) hair and dander 08/07/2021   Allergic rhinitis due to pollen 08/07/2021   Mild intermittent asthma 08/07/2021   Closed fracture of first cervical vertebra (HCC) 08/07/2021   Closed fracture of second cervical vertebra (HCC) 08/07/2021   Cervical spine instability 05/15/2021   Closed 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 05/13/2021   Cervical spinal cord compression (HCC) 05/04/2021   Fall against object 03/25/2021   Hematoma of face, initial encounter 03/25/2021   Degenerative disc disease, cervical 03/11/2021   Spondylolisthesis, cervical region 03/11/2021   Trochanteric bursitis of right hip 09/22/2020   Elevated blood-pressure reading, without diagnosis of  hypertension 07/14/2020   Meningioma (HCC) 03/25/2020   Arthralgia 03/18/2020   RUQ pain 01/02/2020   Cholelithiasis 09/09/2019   Ear pain, bilateral 03/21/2019   Cervical spondylosis 01/05/2019   Neck pain 12/26/2018   Acute pain of left shoulder 12/26/2018   Anxiety 08/22/2018   Contact dermatitis and eczema 06/14/2018   Constipation 11/11/2016   Low back pain 11/10/2016   Leg abrasion 08/12/2016   Contusion of right knee 08/12/2016   Sinusitis, chronic 08/06/2016   Rash and nonspecific skin eruption 04/20/2016   Aortic valve sclerosis 03/12/2015   Contusion of left chest wall 01/09/2015   Fatigue 09/08/2014   Bladder pain 08/19/2014    Increased frequency of urination 08/19/2014   Urinary urgency 08/19/2014   Postherpetic neuralgia 10/02/2013   Impacted cerumen of right ear 07/16/2013   Headache 07/10/2013   Aortic stenosis 04/19/2013   Insomnia 01/24/2013   Cough 08/07/2012   Upper respiratory infection 08/03/2012   Pruritus of skin 03/11/2012   MVP (mitral valve prolapse)    Multiple thyroid nodules 10/04/2011   Herpes zoster 09/07/2011   Essential hypertension, benign 07/27/2011   Atrophic vaginitis    Well adult exam 03/15/2011   Hyperlipemia, mixed 03/15/2011   OAB (overactive bladder) 09/02/2010   Hypothyroidism 03/03/2010   CALF PAIN, LEFT 10/27/2009   Belching 07/21/2009   EAR PAIN 12/19/2008   Allergic rhinitis 12/19/2008   Irritable bowel syndrome 09/04/2008   PERSONAL HX COLONIC POLYPS 09/04/2008   GASTRITIS, CHRONIC 09/03/2008   DUODENITIS, WITHOUT HEMORRHAGE 09/03/2008   TIBIALIS TENDINITIS 03/20/2008   Osteoarthritis 02/25/2008   SWEATING 02/21/2008   LACTOSE INTOLERANCE 11/20/2007   Dyslipidemia 11/20/2007   GERD 11/20/2007   Osteoporosis 11/20/2007    REFERRING PROVIDER:  Enid Baas, MD    REFERRING DIAG:  [phone] (ICD-10-CM) - Right hip pain  M53.3 (ICD-10-CM) - Pain of right sacroiliac joint    Rationale for Evaluation and Treatment: Rehabilitation  THERAPY DIAG:  Right hip pain  ONSET DATE: months ago started at lumbar spine and worked its way to toes   SUBJECTIVE:                                                                                                                                                                                           SUBJECTIVE STATEMENT: Pt reports no falls, but 1-2 near falls since last visit.  PERTINENT HISTORY:  Osteoperosis, h/o cervical fx, chronic SIJ pain, poor balance  PAIN:  Are you having pain? No  PRECAUTIONS:  Fall  WEIGHT BEARING RESTRICTIONS:  No  FALLS:  Has patient fallen in last 6 months? Yes. Number of  falls 1  LIVING ENVIRONMENT: Lives with: lives alone   OCCUPATION:  retired  PLOF:  Independent  PATIENT GOALS:  Improve balance, decr leg heaviness   OBJECTIVE:   DIAGNOSTIC FINDINGS:  DG 02/01/23 Lumbar:  Grade 1 anterolisthesis of L4 versus L5. Grade 1 retrolisthesis of L1 versus L2 and L2 versus L3. No other malalignment. Multilevel degenerative disc disease with loss of disc height at multiple levels and vacuum disc phenomena at multiple levels. Severe lower lumbar facet degenerative changes are noted. Calcified atherosclerotic changes in the abdominal aorta and iliac vessels. Hip:. Visualized pelvic ring is intact. Degenerative changes of the right hip joint are noted. No acute fracture or dislocation is seen. No soft tissue abnormality is noted.   PATIENT SURVEYS:  FOTO 59  COGNITIVE STATUS: Within functional limits for tasks assessed   RED FLAGS: None    SENSATION: WFL   GAIT: Assistive device utilized: Environmental consultant - 4 wheeled Level of assistance: Modified independence Comments: Rt trendelenburg   Body Part #1 Lumbar  Lumbar ROM LUMBAR ROM:   WFL at eval  LE MMT LOWER EXTREMITY MMT:    MMT (lb) Right eval Left eval  Hip flexion 19.3 16.6  Hip extension    Hip abduction 5.1 11.5  Hip adduction    Hip internal rotation    Hip external rotation    Knee flexion 16.7 13.3  Knee extension 17.2 12.3  Ankle dorsiflexion    Ankle plantarflexion    Ankle inversion    Ankle eversion     (Blank rows = not tested)   BERG: 05/16/23 (see flowsheet for full test): 40/56  TREATMENT:                                                                                                                               DATE: 7/5 -Nu-step L4 UE and LEs -Bridge in DF 2x10 Supine SLR x20ea  LAQ 3#- 3" hold 2x10ea Seated marching 3lbs x20ea  Side stepping at rail x3 laps Partial squats 2x10 Heel raises 2x10 Tandem balance 30sec each, SBA/intermittent  CGA Step ups 4" step 2x10ea (single UE support)  DATE: 7/1 -Bridge in DF 2x10 Supine SLR 2x10 R Clam in sidelying 3x10R Sidelying hip abduction 3x10 R LAQ 2.5# 5" 2x10ea (increase wgt next time) Seated marching 2.5lbs x20ea  Side stepping at rail x2 laps Partial squats 2x10 Modified SLS with L toes on 2" box with cues to avoid trendelenberg 2x30sec Heel raises 2x10 Tandem balance 20sec each, SBA Step ups 4" step [id number]   DATE: 6/24 -Bridge in DF 2x10 Clam in sidelying 2x10ea Sidelying hip circles 2x10ea Lateral weight shift with OH reach Side stepping at rail x2 laps Partial squats 2x10 Modified SLS with L toes on 2" box with cues to avoid trendelenberg 2x30sec Heel raises 2x10 Tandem balance 20sec each, SBA Step ups 4" step x15ea    DATE:  EVAL 05/16/23  Bridge in DF Clam Sidelying  hip circles Lateral weight shift with OH reach   PATIENT EDUCATION:  Education details: Anatomy of condition, POC, HEP, exercise form/rationale Person educated: Patient Education method: Explanation, Demonstration, Tactile cues, Verbal cues, and Handouts Education comprehension: verbalized understanding, returned demonstration, verbal cues required, tactile cues required, and needs further education  HOME EXERCISE PROGRAM: [id number]   ASSESSMENT:  CLINICAL IMPRESSION: Reviewed HEP. Able to tolerate nu-step without issue. Increased repetitions with step ups today at 4" step. She is unsteady initially, but then becomes more steady after first few reps. She requires less cuing with squat technique, though some required for increased hip hinge.  Will continue to monitor to monitor balance and fall risk.   OBJECTIVE IMPAIRMENTS: decreased activity tolerance, decreased balance, difficulty walking, decreased ROM, decreased strength, increased muscle spasms, improper body mechanics, postural dysfunction, and pain.   ACTIVITY LIMITATIONS: carrying, lifting, bending, standing, squatting,  stairs, and locomotion level  PARTICIPATION LIMITATIONS: shopping and community activity  PERSONAL FACTORS:  see PMH  are also affecting patient's functional outcome.   REHAB POTENTIAL: Good  CLINICAL DECISION MAKING: Evolving/moderate complexity  EVALUATION COMPLEXITY: Moderate   GOALS: Goals reviewed with patient? Yes  SHORT TERM GOALS: Target date: 06/04/23  Able to demo good contraction of hip abductors in CKC Baseline: Goal status: INITIAL  2.  Independent with HEP as it has been established Baseline:  Goal status: INITIAL    LONG TERM GOALS: Target date: POC date  Meet FOTO goal Baseline:  Goal status: INITIAL  2.  Resolution of trendelenburg  Baseline:  Goal status: INITIAL  3.  BERG to improve by MDC Baseline:  Goal status: INITIAL  4.  Navigate stairs step over step and with minimal to no discomfort Baseline:  Goal status: INITIAL    PLAN:  PT FREQUENCY: 1-2x/week  PT DURATION: 8 weeks  PLANNED INTERVENTIONS: Therapeutic exercises, Therapeutic activity, Neuromuscular re-education, Balance training, Gait training, Patient/Family education, Self Care, Joint mobilization, Stair training, Aquatic Therapy, Dry Needling, Electrical stimulation, Spinal mobilization, Cryotherapy, Moist heat, Taping, Traction, Ultrasound, Ionotophoresis 4mg /ml Dexamethasone, Manual therapy, and Re-evaluation.  PLAN FOR NEXT SESSION: continue to progress lumbopelvic strength  Riki Altes, PTA  06/03/23 10:27 AM

## 2023-06-07 ENCOUNTER — Ambulatory Visit (HOSPITAL_BASED_OUTPATIENT_CLINIC_OR_DEPARTMENT_OTHER): Payer: Medicare Other

## 2023-06-07 ENCOUNTER — Encounter (HOSPITAL_BASED_OUTPATIENT_CLINIC_OR_DEPARTMENT_OTHER): Payer: Self-pay

## 2023-06-07 DIAGNOSIS — M25551 Pain in right hip: Secondary | ICD-10-CM

## 2023-06-07 NOTE — Therapy (Signed)
OUTPATIENT PHYSICAL THERAPY TREATMENT   Patient Name: [name] MRN: [id number] DOB:[birth year], 87 y.o., female Today's Date: 06/07/2023  END OF SESSION:  PT End of Session - 06/07/23 1245     Visit Number 5    Number of Visits 9    Date for PT Re-Evaluation 07/16/23    PT Start Time 1233    PT Stop Time 1317    PT Time Calculation (min) 44 min    Equipment Utilized During Treatment Gait belt    Activity Tolerance Patient tolerated treatment well    Behavior During Therapy WFL for tasks assessed/performed                Past Medical History:  Diagnosis Date   Allergic rhinitis    Asthma    Atrophic vaginitis    Baker's cyst    Left-Dr. Aluisio   Cataract    Dr. Dione Booze   CHF (congestive heart failure) (HCC)    Colon polyps    Fibroid    Gastritis    chronic   GERD (gastroesophageal reflux disease)    Heart murmur    Hemorrhoids    Hyperlipidemia    Hypothyroidism    IBS (irritable bowel syndrome)    Lumbar spondylosis    MVP (mitral valve prolapse)    Antibiotics required for dental procedures   OA (osteoarthritis)    Osteoporosis 06/2018   T score -2.2 stable on Prolia   Overactive bladder    Sacroiliitis (HCC)    Thyroid disease    Thyroid nodule    Torn meniscus    bilateral   Past Surgical History:  Procedure Laterality Date   CATARACT EXTRACTION, BILATERAL     EYE SURGERY     POSTERIOR CERVICAL FUSION/FORAMINOTOMY N/A 05/15/2021   Procedure: Cervical One Laminectomy with resection of cyst, Fixation from Occiput to Cervical Four;  Surgeon: [name], MD;  Location: [facility] OR;  Service: Neurosurgery;  Laterality: N/A;   TONSILLECTOMY     Patient Active Problem List   Diagnosis Date Noted   Chalazion left upper eyelid 04/18/2023   Bruising 04/06/2023   Leg wound, right, initial encounter 01/17/2023   Pain of right sacroiliac joint 01/14/2023   Lumbar degenerative disc disease 12/21/2022   Chronic left SI joint pain 11/30/2022    Inflammation of sacroiliac joint (HCC) 11/16/2022   Stool incontinence 11/16/2022   Memory impairment 11/08/2022   Grief 10/06/2022   History of basal cell carcinoma 09/29/2022   Open forehead wound 07/21/2022   Stress at home 07/21/2022   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   Gait disorder 02/28/2022   History of malignant neoplasm of skin 02/22/2022   Melanocytic nevi of trunk 02/22/2022   Seborrheic dermatitis 02/22/2022   Urticaria 02/22/2022   Irregular bowel habits 11/18/2021   Scalp laceration, sequela 11/02/2021   Concussion 10/29/2021   Scalp itch 08/19/2021   Allergic rhinitis due to animal (cat) (dog) hair and dander 08/07/2021   Allergic rhinitis due to pollen 08/07/2021   Mild intermittent asthma 08/07/2021   Closed fracture of first cervical vertebra (HCC) 08/07/2021   Closed fracture of second cervical vertebra (HCC) 08/07/2021   Cervical spine instability 05/15/2021   Closed 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 05/13/2021   Cervical spinal cord compression (HCC) 05/04/2021   Fall against object 03/25/2021   Hematoma of face, initial encounter 03/25/2021   Degenerative disc disease, cervical 03/11/2021   Spondylolisthesis, cervical region 03/11/2021   Trochanteric bursitis of right hip  09/22/2020   Elevated blood-pressure reading, without diagnosis of hypertension 07/14/2020   Meningioma (HCC) 03/25/2020   Arthralgia 03/18/2020   RUQ pain 01/02/2020   Cholelithiasis 09/09/2019   Ear pain, bilateral 03/21/2019   Cervical spondylosis 01/05/2019   Neck pain 12/26/2018   Acute pain of left shoulder 12/26/2018   Anxiety 08/22/2018   Contact dermatitis and eczema 06/14/2018   Constipation 11/11/2016   Low back pain 11/10/2016   Leg abrasion 08/12/2016   Contusion of right knee 08/12/2016   Sinusitis, chronic 08/06/2016   Rash and nonspecific skin eruption 04/20/2016   Aortic valve sclerosis 03/12/2015   Contusion of left chest wall  01/09/2015   Fatigue 09/08/2014   Bladder pain 08/19/2014   Increased frequency of urination 08/19/2014   Urinary urgency 08/19/2014   Postherpetic neuralgia 10/02/2013   Impacted cerumen of right ear 07/16/2013   Headache 07/10/2013   Aortic stenosis 04/19/2013   Insomnia 01/24/2013   Cough 08/07/2012   Upper respiratory infection 08/03/2012   Pruritus of skin 03/11/2012   MVP (mitral valve prolapse)    Multiple thyroid nodules 10/04/2011   Herpes zoster 09/07/2011   Essential hypertension, benign 07/27/2011   Atrophic vaginitis    Well adult exam 03/15/2011   Hyperlipemia, mixed 03/15/2011   OAB (overactive bladder) 09/02/2010   Hypothyroidism 03/03/2010   CALF PAIN, LEFT 10/27/2009   Belching 07/21/2009   EAR PAIN 12/19/2008   Allergic rhinitis 12/19/2008   Irritable bowel syndrome 09/04/2008   PERSONAL HX COLONIC POLYPS 09/04/2008   GASTRITIS, CHRONIC 09/03/2008   DUODENITIS, WITHOUT HEMORRHAGE 09/03/2008   TIBIALIS TENDINITIS 03/20/2008   Osteoarthritis 02/25/2008   SWEATING 02/21/2008   LACTOSE INTOLERANCE 11/20/2007   Dyslipidemia 11/20/2007   GERD 11/20/2007   Osteoporosis 11/20/2007    REFERRING PROVIDER:  Enid Baas, MD    REFERRING DIAG:  [phone] (ICD-10-CM) - Right hip pain  M53.3 (ICD-10-CM) - Pain of right sacroiliac joint    Rationale for Evaluation and Treatment: Rehabilitation  THERAPY DIAG:  Right hip pain  ONSET DATE: months ago started at lumbar spine and worked its way to toes   SUBJECTIVE:                                                                                                                                                                                           SUBJECTIVE STATEMENT: Pt reports no falls, but 1-2 near falls since last visit.  PERTINENT HISTORY:  Osteoperosis, h/o cervical fx, chronic SIJ pain, poor balance  PAIN:  Are you having pain? No  PRECAUTIONS:  Fall  WEIGHT BEARING RESTRICTIONS:   No  FALLS:  Has  patient fallen in last 6 months? Yes. Number of falls 1  LIVING ENVIRONMENT: Lives with: lives alone   OCCUPATION:  retired  PLOF:  Independent  PATIENT GOALS:  Improve balance, decr leg heaviness   OBJECTIVE:   DIAGNOSTIC FINDINGS:  DG 02/01/23 Lumbar:  Grade 1 anterolisthesis of L4 versus L5. Grade 1 retrolisthesis of L1 versus L2 and L2 versus L3. No other malalignment. Multilevel degenerative disc disease with loss of disc height at multiple levels and vacuum disc phenomena at multiple levels. Severe lower lumbar facet degenerative changes are noted. Calcified atherosclerotic changes in the abdominal aorta and iliac vessels. Hip:. Visualized pelvic ring is intact. Degenerative changes of the right hip joint are noted. No acute fracture or dislocation is seen. No soft tissue abnormality is noted.   PATIENT SURVEYS:  FOTO 59  COGNITIVE STATUS: Within functional limits for tasks assessed   RED FLAGS: None    SENSATION: WFL   GAIT: Assistive device utilized: Environmental consultant - 4 wheeled Level of assistance: Modified independence Comments: Rt trendelenburg   Body Part #1 Lumbar  Lumbar ROM LUMBAR ROM:   WFL at eval  LE MMT LOWER EXTREMITY MMT:    MMT (lb) Right eval Left eval  Hip flexion 19.3 16.6  Hip extension    Hip abduction 5.1 11.5  Hip adduction    Hip internal rotation    Hip external rotation    Knee flexion 16.7 13.3  Knee extension 17.2 12.3  Ankle dorsiflexion    Ankle plantarflexion    Ankle inversion    Ankle eversion     (Blank rows = not tested)   BERG: 05/16/23 (see flowsheet for full test): 40/56  TREATMENT:                                                                                                                               DATE: 7/9 -Nu-step L4 UE and LEs -Bridge in DF 2x10 Supine SLR x20ea  LAQ 3#- 3" hold 2x10R Seated marching 3lbs x20R only Sidelying hip abduction 2x10 R  Side stepping at  rail x3 laps Partial squats 2x10 Heel raises 2x10 Tandem balance 30sec each, SBA/intermittent CGA FT with head turns/nods 30ec ea Standing marching Walking marches, CGA 1/2 hall x2  Step ups 4" step 2x10ea (single UE support)  DATE: 7/5 -Nu-step L4 UE and LEs -Bridge in DF 2x10 Supine SLR x20ea  LAQ 3#- 3" hold 2x10ea Seated marching 3lbs x20ea  Side stepping at rail x3 laps Partial squats 2x10 Heel raises 2x10 Tandem balance 30sec each, SBA/intermittent CGA Step ups 4" step 2x10ea (single UE support)  DATE: 7/1 -Bridge in DF 2x10 Supine SLR 2x10 R Clam in sidelying 3x10R Sidelying hip abduction 3x10 R LAQ 2.5# 5" 2x10ea (increase wgt next time) Seated marching 2.5lbs x20ea  Side stepping at rail x2 laps Partial squats 2x10 Modified SLS with L toes on 2" box with cues to avoid trendelenberg 2x30sec Heel  raises 2x10 Tandem balance 20sec each, SBA Step ups 4" step [id number]   DATE: 6/24 -Bridge in DF 2x10 Clam in sidelying 2x10ea Sidelying hip circles 2x10ea Lateral weight shift with OH reach Side stepping at rail x2 laps Partial squats 2x10 Modified SLS with L toes on 2" box with cues to avoid trendelenberg 2x30sec Heel raises 2x10 Tandem balance 20sec each, SBA Step ups 4" step x15ea    DATE:  EVAL 05/16/23  Bridge in DF Clam Sidelying hip circles Lateral weight shift with OH reach   PATIENT EDUCATION:  Education details: Teacher, music of condition, POC, HEP, exercise form/rationale Person educated: Patient Education method: Explanation, Demonstration, Tactile cues, Verbal cues, and Handouts Education comprehension: verbalized understanding, returned demonstration, verbal cues required, tactile cues required, and needs further education  HOME EXERCISE PROGRAM: [email or web address]   ASSESSMENT:  CLINICAL IMPRESSION: Improved squat technique toady compared to previous session. Progressed work on balance today She completed static head turns/nods with feet together  without unsteadiness. She is more challenged by tandem balance and marching without UE assist. Pt demonstrated no LOB, only unsteadiness. Remains weak with hip abduction.   OBJECTIVE IMPAIRMENTS: decreased activity tolerance, decreased balance, difficulty walking, decreased ROM, decreased strength, increased muscle spasms, improper body mechanics, postural dysfunction, and pain.   ACTIVITY LIMITATIONS: carrying, lifting, bending, standing, squatting, stairs, and locomotion level  PARTICIPATION LIMITATIONS: shopping and community activity  PERSONAL FACTORS:  see PMH  are also affecting patient's functional outcome.   REHAB POTENTIAL: Good  CLINICAL DECISION MAKING: Evolving/moderate complexity  EVALUATION COMPLEXITY: Moderate   GOALS: Goals reviewed with patient? Yes  SHORT TERM GOALS: Target date: 06/04/23  Able to demo good contraction of hip abductors in CKC Baseline: Goal status: INITIAL  2.  Independent with HEP as it has been established Baseline:  Goal status: INITIAL    LONG TERM GOALS: Target date: POC date  Meet FOTO goal Baseline:  Goal status: INITIAL  2.  Resolution of trendelenburg  Baseline:  Goal status: INITIAL  3.  BERG to improve by MDC Baseline:  Goal status: INITIAL  4.  Navigate stairs step over step and with minimal to no discomfort Baseline:  Goal status: INITIAL    PLAN:  PT FREQUENCY: 1-2x/week  PT DURATION: 8 weeks  PLANNED INTERVENTIONS: Therapeutic exercises, Therapeutic activity, Neuromuscular re-education, Balance training, Gait training, Patient/Family education, Self Care, Joint mobilization, Stair training, Aquatic Therapy, Dry Needling, Electrical stimulation, Spinal mobilization, Cryotherapy, Moist heat, Taping, Traction, Ultrasound, Ionotophoresis 4mg /ml Dexamethasone, Manual therapy, and Re-evaluation.  PLAN FOR NEXT SESSION: continue to progress lumbopelvic strength  Riki Altes, PTA  06/07/23 1:22 PM

## 2023-06-08 DIAGNOSIS — H524 Presbyopia: Secondary | ICD-10-CM | POA: Diagnosis not present

## 2023-06-08 DIAGNOSIS — H10413 Chronic giant papillary conjunctivitis, bilateral: Secondary | ICD-10-CM | POA: Diagnosis not present

## 2023-06-09 DIAGNOSIS — R399 Unspecified symptoms and signs involving the genitourinary system: Secondary | ICD-10-CM | POA: Diagnosis not present

## 2023-06-13 ENCOUNTER — Ambulatory Visit (HOSPITAL_BASED_OUTPATIENT_CLINIC_OR_DEPARTMENT_OTHER): Payer: Medicare Other

## 2023-06-13 ENCOUNTER — Encounter (HOSPITAL_BASED_OUTPATIENT_CLINIC_OR_DEPARTMENT_OTHER): Payer: Self-pay

## 2023-06-13 DIAGNOSIS — M25551 Pain in right hip: Secondary | ICD-10-CM | POA: Diagnosis not present

## 2023-06-13 NOTE — Therapy (Signed)
OUTPATIENT PHYSICAL THERAPY TREATMENT   Patient Name: [name] MRN: [id number] DOB:[birth year], 87 y.o., female Today's Date: 06/13/2023  END OF SESSION:  PT End of Session - 06/13/23 1306     Visit Number 6    Number of Visits 9    Date for PT Re-Evaluation 07/16/23    PT Start Time 1303    PT Stop Time 1345    PT Time Calculation (min) 42 min    Activity Tolerance Patient tolerated treatment well    Behavior During Therapy WFL for tasks assessed/performed                 Past Medical History:  Diagnosis Date   Allergic rhinitis    Asthma    Atrophic vaginitis    Baker's cyst    Left-Dr. Aluisio   Cataract    Dr. Dione Booze   CHF (congestive heart failure) (HCC)    Colon polyps    Fibroid    Gastritis    chronic   GERD (gastroesophageal reflux disease)    Heart murmur    Hemorrhoids    Hyperlipidemia    Hypothyroidism    IBS (irritable bowel syndrome)    Lumbar spondylosis    MVP (mitral valve prolapse)    Antibiotics required for dental procedures   OA (osteoarthritis)    Osteoporosis 06/2018   T score -2.2 stable on Prolia   Overactive bladder    Sacroiliitis (HCC)    Thyroid disease    Thyroid nodule    Torn meniscus    bilateral   Past Surgical History:  Procedure Laterality Date   CATARACT EXTRACTION, BILATERAL     EYE SURGERY     POSTERIOR CERVICAL FUSION/FORAMINOTOMY N/A 05/15/2021   Procedure: Cervical One Laminectomy with resection of cyst, Fixation from Occiput to Cervical Four;  Surgeon: [name], MD;  Location: [facility] OR;  Service: Neurosurgery;  Laterality: N/A;   TONSILLECTOMY     Patient Active Problem List   Diagnosis Date Noted   Chalazion left upper eyelid 04/18/2023   Bruising 04/06/2023   Leg wound, right, initial encounter 01/17/2023   Pain of right sacroiliac joint 01/14/2023   Lumbar degenerative disc disease 12/21/2022   Chronic left SI joint pain 11/30/2022   Inflammation of sacroiliac joint (HCC) 11/16/2022    Stool incontinence 11/16/2022   Memory impairment 11/08/2022   Grief 10/06/2022   History of basal cell carcinoma 09/29/2022   Open forehead wound 07/21/2022   Stress at home 07/21/2022   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   Gait disorder 02/28/2022   History of malignant neoplasm of skin 02/22/2022   Melanocytic nevi of trunk 02/22/2022   Seborrheic dermatitis 02/22/2022   Urticaria 02/22/2022   Irregular bowel habits 11/18/2021   Scalp laceration, sequela 11/02/2021   Concussion 10/29/2021   Scalp itch 08/19/2021   Allergic rhinitis due to animal (cat) (dog) hair and dander 08/07/2021   Allergic rhinitis due to pollen 08/07/2021   Mild intermittent asthma 08/07/2021   Closed fracture of first cervical vertebra (HCC) 08/07/2021   Closed fracture of second cervical vertebra (HCC) 08/07/2021   Cervical spine instability 05/15/2021   Closed 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 05/13/2021   Cervical spinal cord compression (HCC) 05/04/2021   Fall against object 03/25/2021   Hematoma of face, initial encounter 03/25/2021   Degenerative disc disease, cervical 03/11/2021   Spondylolisthesis, cervical region 03/11/2021   Trochanteric bursitis of right hip 09/22/2020   Elevated blood-pressure reading, without diagnosis  of hypertension 07/14/2020   Meningioma (HCC) 03/25/2020   Arthralgia 03/18/2020   RUQ pain 01/02/2020   Cholelithiasis 09/09/2019   Ear pain, bilateral 03/21/2019   Cervical spondylosis 01/05/2019   Neck pain 12/26/2018   Acute pain of left shoulder 12/26/2018   Anxiety 08/22/2018   Contact dermatitis and eczema 06/14/2018   Constipation 11/11/2016   Low back pain 11/10/2016   Leg abrasion 08/12/2016   Contusion of right knee 08/12/2016   Sinusitis, chronic 08/06/2016   Rash and nonspecific skin eruption 04/20/2016   Aortic valve sclerosis 03/12/2015   Contusion of left chest wall 01/09/2015   Fatigue 09/08/2014   Bladder pain  08/19/2014   Increased frequency of urination 08/19/2014   Urinary urgency 08/19/2014   Postherpetic neuralgia 10/02/2013   Impacted cerumen of right ear 07/16/2013   Headache 07/10/2013   Aortic stenosis 04/19/2013   Insomnia 01/24/2013   Cough 08/07/2012   Upper respiratory infection 08/03/2012   Pruritus of skin 03/11/2012   MVP (mitral valve prolapse)    Multiple thyroid nodules 10/04/2011   Herpes zoster 09/07/2011   Essential hypertension, benign 07/27/2011   Atrophic vaginitis    Well adult exam 03/15/2011   Hyperlipemia, mixed 03/15/2011   OAB (overactive bladder) 09/02/2010   Hypothyroidism 03/03/2010   CALF PAIN, LEFT 10/27/2009   Belching 07/21/2009   EAR PAIN 12/19/2008   Allergic rhinitis 12/19/2008   Irritable bowel syndrome 09/04/2008   PERSONAL HX COLONIC POLYPS 09/04/2008   GASTRITIS, CHRONIC 09/03/2008   DUODENITIS, WITHOUT HEMORRHAGE 09/03/2008   TIBIALIS TENDINITIS 03/20/2008   Osteoarthritis 02/25/2008   SWEATING 02/21/2008   LACTOSE INTOLERANCE 11/20/2007   Dyslipidemia 11/20/2007   GERD 11/20/2007   Osteoporosis 11/20/2007    REFERRING PROVIDER:  Enid Baas, MD    REFERRING DIAG:  [phone] (ICD-10-CM) - Right hip pain  M53.3 (ICD-10-CM) - Pain of right sacroiliac joint    Rationale for Evaluation and Treatment: Rehabilitation  THERAPY DIAG:  Right hip pain  ONSET DATE: months ago started at lumbar spine and worked its way to toes   SUBJECTIVE:                                                                                                                                                                                           SUBJECTIVE STATEMENT: Pt reports no recent falls. She does report having increase upper back stiffness in the mornings when first waking up. Reports PT has bene helpful with her hip.   PERTINENT HISTORY:  Osteoperosis, h/o cervical fx, chronic SIJ pain, poor balance  PAIN:  Are you having pain?  No  PRECAUTIONS:  Fall  WEIGHT BEARING RESTRICTIONS:  No  FALLS:  Has patient fallen in last 6 months? Yes. Number of falls 1  LIVING ENVIRONMENT: Lives with: lives alone   OCCUPATION:  retired  PLOF:  Independent  PATIENT GOALS:  Improve balance, decr leg heaviness   OBJECTIVE:   DIAGNOSTIC FINDINGS:  DG 02/01/23 Lumbar:  Grade 1 anterolisthesis of L4 versus L5. Grade 1 retrolisthesis of L1 versus L2 and L2 versus L3. No other malalignment. Multilevel degenerative disc disease with loss of disc height at multiple levels and vacuum disc phenomena at multiple levels. Severe lower lumbar facet degenerative changes are noted. Calcified atherosclerotic changes in the abdominal aorta and iliac vessels. Hip:. Visualized pelvic ring is intact. Degenerative changes of the right hip joint are noted. No acute fracture or dislocation is seen. No soft tissue abnormality is noted.   PATIENT SURVEYS:  FOTO 59  COGNITIVE STATUS: Within functional limits for tasks assessed   RED FLAGS: None    SENSATION: WFL   GAIT: Assistive device utilized: Environmental consultant - 4 wheeled Level of assistance: Modified independence Comments: Rt trendelenburg   Body Part #1 Lumbar  Lumbar ROM LUMBAR ROM:   WFL at eval  LE MMT LOWER EXTREMITY MMT:    MMT (lb) Right eval Left eval  Hip flexion 19.3 16.6  Hip extension    Hip abduction 5.1 11.5  Hip adduction    Hip internal rotation    Hip external rotation    Knee flexion 16.7 13.3  Knee extension 17.2 12.3  Ankle dorsiflexion    Ankle plantarflexion    Ankle inversion    Ankle eversion     (Blank rows = not tested)   BERG: 05/16/23 (see flowsheet for full test): 40/56  TREATMENT:                                                                                                                                DATE: 7/15 -Nu-step L4 UE and LEs -Bridge in DF 2x10 Supine SLR x20ea ` LAQ 3#- 3" hold 2x10R Seated marching 3lbs  x20R only Sidelying hip abduction 3x10 bil  Heel raises 2x10 Staggered sit to stands x10  Step ups 6" step 2x10R (single UE support) Stair climbing- 1/2 flight- reciprocal- single rail use (SBA)   DATE: 7/9 -Nu-step L4 UE and LEs -Bridge in DF 2x10 Supine SLR x20ea  LAQ 3#- 3" hold 2x10R Seated marching 3lbs x20R only Sidelying hip abduction 2x10 R  Heel raises 2x10 Standing hip abduction  Step ups 6" step 2x10R (single UE support)   PATIENT EDUCATION:  Education details: Anatomy of condition, POC, HEP, exercise form/rationale Person educated: Patient Education method: Explanation, Demonstration, Tactile cues, Verbal cues, and Handouts Education comprehension: verbalized understanding, returned demonstration, verbal cues required, tactile cues required, and needs further education  HOME EXERCISE PROGRAM: [email or web address]   ASSESSMENT:  CLINICAL IMPRESSION: FOTO score captured today at 61%, a decrease from 63% at IE.  Pt continues to be challenged by hip abduction in R hip and demonstrates trendelenburg in standing. Worked on improving pt awareness of this so she can work on activating glute medius during ADLs. Able to climb 1/2 flight of stairs today, though required L UE assist on rail. She reports greater challenge with ascending stairs vs descending.    OBJECTIVE IMPAIRMENTS: decreased activity tolerance, decreased balance, difficulty walking, decreased ROM, decreased strength, increased muscle spasms, improper body mechanics, postural dysfunction, and pain.   ACTIVITY LIMITATIONS: carrying, lifting, bending, standing, squatting, stairs, and locomotion level  PARTICIPATION LIMITATIONS: shopping and community activity  PERSONAL FACTORS:  see PMH  are also affecting patient's functional outcome.   REHAB POTENTIAL: Good  CLINICAL DECISION MAKING: Evolving/moderate complexity  EVALUATION COMPLEXITY: Moderate   GOALS: Goals reviewed with patient? Yes  SHORT TERM  GOALS: Target date: 06/04/23  Able to demo good contraction of hip abductors in CKC Baseline: Goal status: IN PROGRESS 7/15  2.  Independent with HEP as it has been established Baseline:  Goal status: MET 7/15    LONG TERM GOALS: Target date: POC date  Meet FOTO goal Baseline:  Goal status: INITIAL  2.  Resolution of trendelenburg  Baseline:  Goal status: INITIAL  3.  BERG to improve by MDC Baseline:  Goal status: INITIAL  4.  Navigate stairs step over step and with minimal to no discomfort Baseline:  Goal status: INITIAL    PLAN:  PT FREQUENCY: 1-2x/week  PT DURATION: 8 weeks  PLANNED INTERVENTIONS: Therapeutic exercises, Therapeutic activity, Neuromuscular re-education, Balance training, Gait training, Patient/Family education, Self Care, Joint mobilization, Stair training, Aquatic Therapy, Dry Needling, Electrical stimulation, Spinal mobilization, Cryotherapy, Moist heat, Taping, Traction, Ultrasound, Ionotophoresis 4mg /ml Dexamethasone, Manual therapy, and Re-evaluation.  PLAN FOR NEXT SESSION: continue to progress lumbopelvic strength  Riki Altes, PTA  06/13/23 2:31 PM

## 2023-06-20 ENCOUNTER — Encounter (HOSPITAL_BASED_OUTPATIENT_CLINIC_OR_DEPARTMENT_OTHER): Payer: Self-pay | Admitting: Physical Therapy

## 2023-06-20 ENCOUNTER — Ambulatory Visit (HOSPITAL_BASED_OUTPATIENT_CLINIC_OR_DEPARTMENT_OTHER): Payer: Medicare Other | Admitting: Physical Therapy

## 2023-06-20 DIAGNOSIS — M25551 Pain in right hip: Secondary | ICD-10-CM | POA: Diagnosis not present

## 2023-06-20 NOTE — Therapy (Signed)
OUTPATIENT PHYSICAL THERAPY TREATMENT   Patient Name: LIZETH BENCOSME MRN: [id number] DOB:1936/11/05, 87 y.o., female Today's Date: 06/20/2023  END OF SESSION:  PT End of Session - 06/20/23 1406     Visit Number 7    Number of Visits 23    Date for PT Re-Evaluation 08/20/23    PT Start Time 1405    PT Stop Time 1445    PT Time Calculation (min) 40 min    Activity Tolerance Patient tolerated treatment well    Behavior During Therapy WFL for tasks assessed/performed                 Past Medical History:  Diagnosis Date   Allergic rhinitis    Asthma    Atrophic vaginitis    Baker's cyst    Left-Dr. Aluisio   Cataract    Dr. Dione Booze   CHF (congestive heart failure) (HCC)    Colon polyps    Fibroid    Gastritis    chronic   GERD (gastroesophageal reflux disease)    Heart murmur    Hemorrhoids    Hyperlipidemia    Hypothyroidism    IBS (irritable bowel syndrome)    Lumbar spondylosis    MVP (mitral valve prolapse)    Antibiotics required for dental procedures   OA (osteoarthritis)    Osteoporosis 06/2018   T score -2.2 stable on Prolia   Overactive bladder    Sacroiliitis (HCC)    Thyroid disease    Thyroid nodule    Torn meniscus    bilateral   Past Surgical History:  Procedure Laterality Date   CATARACT EXTRACTION, BILATERAL     EYE SURGERY     POSTERIOR CERVICAL FUSION/FORAMINOTOMY N/A 05/15/2021   Procedure: Cervical One Laminectomy with resection of cyst, Fixation from Occiput to Cervical Four;  Surgeon: [name], MD;  Location: [facility] OR;  Service: Neurosurgery;  Laterality: N/A;   TONSILLECTOMY     Patient Active Problem List   Diagnosis Date Noted   Chalazion left upper eyelid 04/18/2023   Bruising 04/06/2023   Leg wound, right, initial encounter 01/17/2023   Pain of right sacroiliac joint 01/14/2023   Lumbar degenerative disc disease 12/21/2022   Chronic left SI joint pain 11/30/2022   Inflammation of sacroiliac joint (HCC) 11/16/2022    Stool incontinence 11/16/2022   Memory impairment 11/08/2022   Grief 10/06/2022   History of basal cell carcinoma 09/29/2022   Open forehead wound 07/21/2022   Stress at home 07/21/2022   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   Gait disorder 02/28/2022   History of malignant neoplasm of skin 02/22/2022   Melanocytic nevi of trunk 02/22/2022   Seborrheic dermatitis 02/22/2022   Urticaria 02/22/2022   Irregular bowel habits 11/18/2021   Scalp laceration, sequela 11/02/2021   Concussion 10/29/2021   Scalp itch 08/19/2021   Allergic rhinitis due to animal (cat) (dog) hair and dander 08/07/2021   Allergic rhinitis due to pollen 08/07/2021   Mild intermittent asthma 08/07/2021   Closed fracture of first cervical vertebra (HCC) 08/07/2021   Closed fracture of second cervical vertebra (HCC) 08/07/2021   Cervical spine instability 05/15/2021   Closed 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 05/13/2021   Cervical spinal cord compression (HCC) 05/04/2021   Fall against object 03/25/2021   Hematoma of face, initial encounter 03/25/2021   Degenerative disc disease, cervical 03/11/2021   Spondylolisthesis, cervical region 03/11/2021   Trochanteric bursitis of right hip 09/22/2020   Elevated blood-pressure reading, without diagnosis  of hypertension 07/14/2020   Meningioma (HCC) 03/25/2020   Arthralgia 03/18/2020   RUQ pain 01/02/2020   Cholelithiasis 09/09/2019   Ear pain, bilateral 03/21/2019   Cervical spondylosis 01/05/2019   Neck pain 12/26/2018   Acute pain of left shoulder 12/26/2018   Anxiety 08/22/2018   Contact dermatitis and eczema 06/14/2018   Constipation 11/11/2016   Low back pain 11/10/2016   Leg abrasion 08/12/2016   Contusion of right knee 08/12/2016   Sinusitis, chronic 08/06/2016   Rash and nonspecific skin eruption 04/20/2016   Aortic valve sclerosis 03/12/2015   Contusion of left chest wall 01/09/2015   Fatigue 09/08/2014   Bladder pain  08/19/2014   Increased frequency of urination 08/19/2014   Urinary urgency 08/19/2014   Postherpetic neuralgia 10/02/2013   Impacted cerumen of right ear 07/16/2013   Headache 07/10/2013   Aortic stenosis 04/19/2013   Insomnia 01/24/2013   Cough 08/07/2012   Upper respiratory infection 08/03/2012   Pruritus of skin 03/11/2012   MVP (mitral valve prolapse)    Multiple thyroid nodules 10/04/2011   Herpes zoster 09/07/2011   Essential hypertension, benign 07/27/2011   Atrophic vaginitis    Well adult exam 03/15/2011   Hyperlipemia, mixed 03/15/2011   OAB (overactive bladder) 09/02/2010   Hypothyroidism 03/03/2010   CALF PAIN, LEFT 10/27/2009   Belching 07/21/2009   EAR PAIN 12/19/2008   Allergic rhinitis 12/19/2008   Irritable bowel syndrome 09/04/2008   PERSONAL HX COLONIC POLYPS 09/04/2008   GASTRITIS, CHRONIC 09/03/2008   DUODENITIS, WITHOUT HEMORRHAGE 09/03/2008   TIBIALIS TENDINITIS 03/20/2008   Osteoarthritis 02/25/2008   SWEATING 02/21/2008   LACTOSE INTOLERANCE 11/20/2007   Dyslipidemia 11/20/2007   GERD 11/20/2007   Osteoporosis 11/20/2007    REFERRING PROVIDER:  Enid Baas, MD    REFERRING DIAG:  M25.551 (ICD-10-CM) - Right hip pain  M53.3 (ICD-10-CM) - Pain of right sacroiliac joint    Rationale for Evaluation and Treatment: Rehabilitation  THERAPY DIAG:  Right hip pain  ONSET DATE: months ago started at lumbar spine and worked its way to toes   SUBJECTIVE:                                                                                                                                                                                           SUBJECTIVE STATEMENT: Progress Note Reporting Period 05/16/23 to 06/20/2023   See note below for Objective Data and Assessment of Progress/Goals.     My hip is definitely better, balance is better but still needs improvement. My walking is better. I have started to wake up with my back being sore.     PERTINENT  HISTORY:  Osteoperosis, h/o cervical fx, chronic SIJ pain, poor balance  PAIN:  Are you having pain? No  PRECAUTIONS:  Fall  WEIGHT BEARING RESTRICTIONS:  No  FALLS:  Has patient fallen in last 6 months? Yes. Number of falls 1  LIVING ENVIRONMENT: Lives with: lives alone   OCCUPATION:  retired  PLOF:  Independent  PATIENT GOALS:  Improve balance, decr leg heaviness   OBJECTIVE:   DIAGNOSTIC FINDINGS:  DG 02/01/23 Lumbar:  Grade 1 anterolisthesis of L4 versus L5. Grade 1 retrolisthesis of L1 versus L2 and L2 versus L3. No other malalignment. Multilevel degenerative disc disease with loss of disc height at multiple levels and vacuum disc phenomena at multiple levels. Severe lower lumbar facet degenerative changes are noted. Calcified atherosclerotic changes in the abdominal aorta and iliac vessels. Hip:. Visualized pelvic ring is intact. Degenerative changes of the right hip joint are noted. No acute fracture or dislocation is seen. No soft tissue abnormality is noted.   PATIENT SURVEYS:  FOTO 4 FOTO closed on 7/22  COGNITIVE STATUS: Within functional limits for tasks assessed  7/22: pt states that this is only her 2nd visit to PT and does not recall the other appointments when I advise her that today is visit 7.   RED FLAGS: None    SENSATION: WFL   GAIT: Assistive device utilized: Walker - 4 wheeled Level of assistance: Modified independence Comments: Rt trendelenburg  7/22: ambulating with SPC, no trunk mobility or rotation in gait, slow cadence, very minimal trendelenburg.   Body Part #1 Lumbar   LE MMT LOWER EXTREMITY MMT:    MMT (lb) Right eval Left eval Rt/Lt 7/22  Hip flexion 19.3 16.6   Hip extension     Hip abduction 5.1 11.5 7.2/13.0  Hip adduction     Hip internal rotation     Hip external rotation     Knee flexion 16.7 13.3 16.2/16.2  Knee extension 17.2 12.3 24.0/21.7  Ankle dorsiflexion     Ankle  plantarflexion     Ankle inversion     Ankle eversion      (Blank rows = not tested)   BERG: 05/16/23 (see flowsheet for full test): 40/56 7/22: 44/56 (see flowsheet for full test)  TREATMENT:                                                                                                                                DATE: 7/15 -Nu-step L4 UE and LEs -Bridge in DF 2x10 Supine SLR x20ea ` LAQ 3#- 3" hold 2x10R Seated marching 3lbs x20R only Sidelying hip abduction 3x10 bil  Heel raises 2x10 Staggered sit to stands x10  Step ups 6" step 2x10R (single UE support) Stair climbing- 1/2 flight- reciprocal- single rail use (SBA)   DATE: 7/9 -Nu-step L4 UE and LEs -Bridge in DF 2x10 Supine SLR x20ea  LAQ 3#- 3" hold 2x10R Seated marching 3lbs x20R only Sidelying  hip abduction 2x10 R  Heel raises 2x10 Standing hip abduction  Step ups 6" step 2x10R (single UE support)   PATIENT EDUCATION:  Education details: Anatomy of condition, POC, HEP, exercise form/rationale Person educated: Patient Education method: Explanation, Demonstration, Tactile cues, Verbal cues, and Handouts Education comprehension: verbalized understanding, returned demonstration, verbal cues required, tactile cues required, and needs further education  HOME EXERCISE PROGRAM: [email or web address]   ASSESSMENT:  CLINICAL IMPRESSION: Pt has made some improvement in strength and balance and will benefit from continued therapy. 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 I do think this would be very beneficial to her. Time also taken to discuss long term plans for exercise in order to maintain gains from PT- explained the difference between PT and long term fitness and rational for gradual/overlapped transition. Pt states her hip is much better and she is  walking/moving around better. Score on FOTO went down significantly which she reports is because of her back pain, episode d/c due to inconsistency as the FOTO qq is about her hip. Notable that her left great toe is adducting and overlapped fully by second digit which significantly limits balance. Pt reports she actually prefers to wear tennis shoes. Will extend PT POC to continue progressions.    OBJECTIVE IMPAIRMENTS: decreased activity tolerance, decreased balance, difficulty walking, decreased ROM, decreased strength, increased muscle spasms, improper body mechanics, postural dysfunction, and pain.   ACTIVITY LIMITATIONS: carrying, lifting, bending, standing, squatting, stairs, and locomotion level  PARTICIPATION LIMITATIONS: shopping and community activity  PERSONAL FACTORS:  see PMH  are also affecting patient's functional outcome.   REHAB POTENTIAL: Good  CLINICAL DECISION MAKING: Evolving/moderate complexity  EVALUATION COMPLEXITY: Moderate   GOALS: Goals reviewed with patient? Yes  SHORT TERM GOALS: Target date: 06/04/23  Able to demo good contraction of hip abductors in CKC Baseline: Goal status: IN PROGRESS 7/15  2.  Independent with HEP as it has been established Baseline:  Goal status: MET 7/15    LONG TERM GOALS: Target date: POC date  Meet FOTO goal Baseline:  Goal status: deferred  2.  Resolution of trendelenburg  Baseline:  Goal status: ongoing  3.  BERG to improve by MDC Baseline:  Goal status:ongoing  4.  Navigate stairs step over step and with minimal to no discomfort Baseline:  Goal status:ongoing    PLAN:  PT FREQUENCY: 1-2x/week  PT DURATION: 8 weeks  PLANNED INTERVENTIONS: Therapeutic exercises, Therapeutic activity, Neuromuscular re-education, Balance training, Gait training, Patient/Family education, Self Care, Joint mobilization, Stair training, Aquatic Therapy, Dry Needling, Electrical stimulation, Spinal mobilization, Cryotherapy,  Moist heat, Taping, Traction, Ultrasound, Ionotophoresis 4mg /ml Dexamethasone, Manual therapy, and Re-evaluation.  PLAN FOR NEXT SESSION: continue to progress lumbopelvic strength  Eduarda Scrivens C. Leighton Luster PT, DPT 06/20/23 3:41 PM

## 2023-06-22 ENCOUNTER — Ambulatory Visit (INDEPENDENT_AMBULATORY_CARE_PROVIDER_SITE_OTHER): Payer: Medicare Other | Admitting: Sports Medicine

## 2023-06-22 VITALS — BP 130/68 | Ht 60.0 in | Wt 116.0 lb

## 2023-06-22 DIAGNOSIS — M47812 Spondylosis without myelopathy or radiculopathy, cervical region: Secondary | ICD-10-CM | POA: Diagnosis not present

## 2023-06-22 DIAGNOSIS — M5136 Other intervertebral disc degeneration, lumbar region: Secondary | ICD-10-CM | POA: Diagnosis not present

## 2023-06-22 DIAGNOSIS — M25551 Pain in right hip: Secondary | ICD-10-CM | POA: Diagnosis not present

## 2023-06-22 NOTE — Patient Instructions (Signed)
Start using soft/spiky massage ball. Use Voltaren gel on shoulders Easy motion swings/circles with your arms

## 2023-06-22 NOTE — Progress Notes (Signed)
Chief complaint; follow-up of low back and hip pain  The physical therapy regimen has really helped her low back and hip pain No lateral hip pain noted today Low back does feel somewhat stiff but not painful She is able to walk most the time around her room without any support Most walking she is able to do just with the assistance of a cane for balance Rarely will use the walker for long walks  1 issue following the physical therapy is that she is doing some upper extremity exercises These felt good but afterwards she has pain in her posterior neck radiating into both shoulders She has had a significant neck fusion and I believe the exercises may have been thinks that she was not used to doing  Physical exam Pleasant elderly lady in no acute distress BP 130/68   Ht 5' (1.524 m)   Wt 116 lb (52.6 kg)   BMI 22.65 kg/m   Neck range of motion is extremely limited This does trigger some symptoms into her trapezius bilaterally Significant midline scar  Hip motion particularly on the right is good without pain today Walking gait reveals that she is pretty steady walking without her cane but when she turns she has to be careful with her balance Walking with a cane looks very steady

## 2023-06-22 NOTE — Assessment & Plan Note (Signed)
I believe this is creating the tightness in her trapezius and I suggested that we modified any physical therapy exercises not to do too much stress to her neck She will do some circular motion of her shoulders and swings to relax the trapezius Topical Voltaren as needed

## 2023-06-22 NOTE — Assessment & Plan Note (Signed)
Physical therapy has really been helpful for this and the radiating pain into her hip and leg I suggested keeping up physical therapy at least once a week over the next 6 weeks or so Recheck with me in approximately 2 months or as needed

## 2023-06-27 ENCOUNTER — Ambulatory Visit (HOSPITAL_BASED_OUTPATIENT_CLINIC_OR_DEPARTMENT_OTHER): Payer: Medicare Other

## 2023-06-27 ENCOUNTER — Encounter (HOSPITAL_BASED_OUTPATIENT_CLINIC_OR_DEPARTMENT_OTHER): Payer: Self-pay

## 2023-06-27 DIAGNOSIS — M25551 Pain in right hip: Secondary | ICD-10-CM

## 2023-06-27 NOTE — Therapy (Signed)
OUTPATIENT PHYSICAL THERAPY TREATMENT   Patient Name: [name] MRN: [id number] DOB:12-Nov-1936, 87 y.o., female Today's Date: 06/27/2023  END OF SESSION:  PT End of Session - 06/27/23 1308     Visit Number 8    Number of Visits 23    Date for PT Re-Evaluation 08/20/23    PT Start Time 1301    PT Stop Time 1345    PT Time Calculation (min) 44 min    Activity Tolerance Patient tolerated treatment well    Behavior During Therapy WFL for tasks assessed/performed                  Past Medical History:  Diagnosis Date   Allergic rhinitis    Asthma    Atrophic vaginitis    Baker's cyst    Left-Dr. Aluisio   Cataract    Dr. Dione Booze   CHF (congestive heart failure) (HCC)    Colon polyps    Fibroid    Gastritis    chronic   GERD (gastroesophageal reflux disease)    Heart murmur    Hemorrhoids    Hyperlipidemia    Hypothyroidism    IBS (irritable bowel syndrome)    Lumbar spondylosis    MVP (mitral valve prolapse)    Antibiotics required for dental procedures   OA (osteoarthritis)    Osteoporosis 06/2018   T score -2.2 stable on Prolia   Overactive bladder    Sacroiliitis (HCC)    Thyroid disease    Thyroid nodule    Torn meniscus    bilateral   Past Surgical History:  Procedure Laterality Date   CATARACT EXTRACTION, BILATERAL     EYE SURGERY     POSTERIOR CERVICAL FUSION/FORAMINOTOMY N/A 05/15/2021   Procedure: Cervical One Laminectomy with resection of cyst, Fixation from Occiput to Cervical Four;  Surgeon: [name], MD;  Location: [facility] OR;  Service: Neurosurgery;  Laterality: N/A;   TONSILLECTOMY     Patient Active Problem List   Diagnosis Date Noted   Chalazion left upper eyelid 04/18/2023   Bruising 04/06/2023   Leg wound, right, initial encounter 01/17/2023   Pain of right sacroiliac joint 01/14/2023   Lumbar degenerative disc disease 12/21/2022   Chronic left SI joint pain 11/30/2022   Inflammation of sacroiliac joint (HCC) 11/16/2022    Stool incontinence 11/16/2022   Memory impairment 11/08/2022   Grief 10/06/2022   History of basal cell carcinoma 09/29/2022   Open forehead wound 07/21/2022   Stress at home 07/21/2022   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   Gait disorder 02/28/2022   History of malignant neoplasm of skin 02/22/2022   Melanocytic nevi of trunk 02/22/2022   Seborrheic dermatitis 02/22/2022   Urticaria 02/22/2022   Irregular bowel habits 11/18/2021   Scalp laceration, sequela 11/02/2021   Concussion 10/29/2021   Scalp itch 08/19/2021   Allergic rhinitis due to animal (cat) (dog) hair and dander 08/07/2021   Allergic rhinitis due to pollen 08/07/2021   Mild intermittent asthma 08/07/2021   Closed fracture of first cervical vertebra (HCC) 08/07/2021   Closed fracture of second cervical vertebra (HCC) 08/07/2021   Cervical spine instability 05/15/2021   Closed 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 05/13/2021   Cervical spinal cord compression (HCC) 05/04/2021   Fall against object 03/25/2021   Hematoma of face, initial encounter 03/25/2021   Degenerative disc disease, cervical 03/11/2021   Spondylolisthesis, cervical region 03/11/2021   Trochanteric bursitis of right hip 09/22/2020   Elevated blood-pressure reading, without  diagnosis of hypertension 07/14/2020   Meningioma (HCC) 03/25/2020   Arthralgia 03/18/2020   RUQ pain 01/02/2020   Cholelithiasis 09/09/2019   Ear pain, bilateral 03/21/2019   Cervical spondylosis 01/05/2019   Neck pain 12/26/2018   Acute pain of left shoulder 12/26/2018   Anxiety 08/22/2018   Contact dermatitis and eczema 06/14/2018   Constipation 11/11/2016   Low back pain 11/10/2016   Leg abrasion 08/12/2016   Contusion of right knee 08/12/2016   Sinusitis, chronic 08/06/2016   Rash and nonspecific skin eruption 04/20/2016   Aortic valve sclerosis 03/12/2015   Contusion of left chest wall 01/09/2015   Fatigue 09/08/2014   Bladder pain  08/19/2014   Increased frequency of urination 08/19/2014   Urinary urgency 08/19/2014   Postherpetic neuralgia 10/02/2013   Impacted cerumen of right ear 07/16/2013   Headache 07/10/2013   Aortic stenosis 04/19/2013   Insomnia 01/24/2013   Cough 08/07/2012   Upper respiratory infection 08/03/2012   Pruritus of skin 03/11/2012   MVP (mitral valve prolapse)    Multiple thyroid nodules 10/04/2011   Herpes zoster 09/07/2011   Essential hypertension, benign 07/27/2011   Atrophic vaginitis    Well adult exam 03/15/2011   Hyperlipemia, mixed 03/15/2011   OAB (overactive bladder) 09/02/2010   Hypothyroidism 03/03/2010   CALF PAIN, LEFT 10/27/2009   Belching 07/21/2009   EAR PAIN 12/19/2008   Allergic rhinitis 12/19/2008   Irritable bowel syndrome 09/04/2008   PERSONAL HX COLONIC POLYPS 09/04/2008   GASTRITIS, CHRONIC 09/03/2008   DUODENITIS, WITHOUT HEMORRHAGE 09/03/2008   TIBIALIS TENDINITIS 03/20/2008   Osteoarthritis 02/25/2008   SWEATING 02/21/2008   LACTOSE INTOLERANCE 11/20/2007   Dyslipidemia 11/20/2007   GERD 11/20/2007   Osteoporosis 11/20/2007    REFERRING PROVIDER:  Enid Baas, MD    REFERRING DIAG:  [phone] (ICD-10-CM) - Right hip pain  M53.3 (ICD-10-CM) - Pain of right sacroiliac joint    Rationale for Evaluation and Treatment: Rehabilitation  THERAPY DIAG:  Right hip pain  ONSET DATE: months ago started at lumbar spine and worked its way to toes   SUBJECTIVE:                                                                                                                                                                                           SUBJECTIVE STATEMENT:   Pt reports 3-4/10 pain level predominantly in low back area. "My balance had been doing better, but the last few days it hasn't been as good. I don't know if it's the weather or what."   PERTINENT HISTORY:  Osteoperosis, h/o cervical fx, chronic SIJ pain, poor balance  PAIN:  Are  you having pain? Yes 3-4/10 in low back  PRECAUTIONS:  Fall  WEIGHT BEARING RESTRICTIONS:  No  FALLS:  Has patient fallen in last 6 months? Yes. Number of falls 1  LIVING ENVIRONMENT: Lives with: lives alone   OCCUPATION:  retired  PLOF:  Independent  PATIENT GOALS:  Improve balance, decr leg heaviness   OBJECTIVE:   DIAGNOSTIC FINDINGS:  DG 02/01/23 Lumbar:  Grade 1 anterolisthesis of L4 versus L5. Grade 1 retrolisthesis of L1 versus L2 and L2 versus L3. No other malalignment. Multilevel degenerative disc disease with loss of disc height at multiple levels and vacuum disc phenomena at multiple levels. Severe lower lumbar facet degenerative changes are noted. Calcified atherosclerotic changes in the abdominal aorta and iliac vessels. Hip:. Visualized pelvic ring is intact. Degenerative changes of the right hip joint are noted. No acute fracture or dislocation is seen. No soft tissue abnormality is noted.   PATIENT SURVEYS:  FOTO 31 FOTO closed on 7/22  COGNITIVE STATUS: Within functional limits for tasks assessed  7/22: pt states that this is only her 2nd visit to PT and does not recall the other appointments when I advise her that today is visit 7.   RED FLAGS: None    SENSATION: WFL   GAIT: Assistive device utilized: Walker - 4 wheeled Level of assistance: Modified independence Comments: Rt trendelenburg  7/22: ambulating with SPC, no trunk mobility or rotation in gait, slow cadence, very minimal trendelenburg.   Body Part #1 Lumbar   LE MMT LOWER EXTREMITY MMT:    MMT (lb) Right eval Left eval Rt/Lt 7/22  Hip flexion 19.3 16.6   Hip extension     Hip abduction 5.1 11.5 7.2/13.0  Hip adduction     Hip internal rotation     Hip external rotation     Knee flexion 16.7 13.3 16.2/16.2  Knee extension 17.2 12.3 24.0/21.7  Ankle dorsiflexion     Ankle plantarflexion     Ankle inversion     Ankle eversion      (Blank rows = not tested)    BERG: 05/16/23 (see flowsheet for full test): 40/56 7/22: 44/56 (see flowsheet for full test)  TREATMENT:                                                                                                                               DATE: 7/29 -Nu-step L4 UE and Les -Manual stretching of R piriformis and R ITB -DKTC- 30sec x3 LTR- 5" x10ea  LAQ 3#- 3" hold 2x10R Seated marching 3lbs 2x20 Sidelying hip abduction 2x10 bil  Heel raises 2x10 Staggered sit to stands x10  Stair climbing- full flight- reciprocal- single rail use (SBA)   DATE: 7/15 -Nu-step L4 UE and LEs -Bridge in DF 2x10 Supine SLR x20ea ` LAQ 3#- 3" hold 2x10R Seated marching 3lbs x20R only Sidelying hip abduction 3x10 bil  Heel raises 2x10 Staggered sit to stands  x10  Step ups 6" step 2x10R (single UE support) Stair climbing- 1/2 flight- reciprocal- single rail use (SBA)   DATE: 7/9 -Nu-step L4 UE and LEs -Bridge in DF 2x10 Supine SLR x20ea  LAQ 3#- 3" hold 2x10R Seated marching 3lbs x20R only Sidelying hip abduction 2x10 R  Heel raises 2x10 Standing hip abduction  Step ups 6" step 2x10R (single UE support)   PATIENT EDUCATION:  Education details: Anatomy of condition, POC, HEP, exercise form/rationale Person educated: Patient Education method: Explanation, Demonstration, Tactile cues, Verbal cues, and Handouts Education comprehension: verbalized understanding, returned demonstration, verbal cues required, tactile cues required, and needs further education  HOME EXERCISE PROGRAM: [id number]   ASSESSMENT:  CLINICAL IMPRESSION: Pt reported relief from LTR and DKTC, so prescribed these for HEP to relieve lumbar discomfort. Improved back pain at end of session. Good tolerance for stair climbing, though pt apprehensive and requires SBA. Pt would like to improve in her stair climbing ability for performance outside of clinic.  Reviewed HEP with pt for when she is travelling to [place].     OBJECTIVE IMPAIRMENTS: decreased activity tolerance, decreased balance, difficulty walking, decreased ROM, decreased strength, increased muscle spasms, improper body mechanics, postural dysfunction, and pain.   ACTIVITY LIMITATIONS: carrying, lifting, bending, standing, squatting, stairs, and locomotion level  PARTICIPATION LIMITATIONS: shopping and community activity  PERSONAL FACTORS:  see PMH  are also affecting patient's functional outcome.   REHAB POTENTIAL: Good  CLINICAL DECISION MAKING: Evolving/moderate complexity  EVALUATION COMPLEXITY: Moderate   GOALS: Goals reviewed with patient? Yes  SHORT TERM GOALS: Target date: 06/04/23  Able to demo good contraction of hip abductors in [facility] Baseline: Goal status: IN PROGRESS 7/15  2.  Independent with HEP as it has been established Baseline:  Goal status: MET 7/15    LONG TERM GOALS: Target date: POC date  Meet FOTO goal Baseline:  Goal status: deferred  2.  Resolution of trendelenburg  Baseline:  Goal status: ongoing  3.  BERG to improve by MDC Baseline:  Goal status:ongoing  4.  Navigate stairs step over step and with minimal to no discomfort Baseline:  Goal status:ongoing    PLAN:  PT FREQUENCY: 1-2x/week  PT DURATION: 8 weeks  PLANNED INTERVENTIONS: Therapeutic exercises, Therapeutic activity, Neuromuscular re-education, Balance training, Gait training, Patient/Family education, Self Care, Joint mobilization, Stair training, Aquatic Therapy, Dry Needling, Electrical stimulation, Spinal mobilization, Cryotherapy, Moist heat, Taping, Traction, Ultrasound, Ionotophoresis 4mg /ml Dexamethasone, Manual therapy, and Re-evaluation.  PLAN FOR NEXT SESSION: continue to progress lumbopelvic strength  Riki Altes, PTA  06/27/23 3:12 PM

## 2023-06-29 ENCOUNTER — Other Ambulatory Visit: Payer: Medicare Other

## 2023-06-29 ENCOUNTER — Encounter (INDEPENDENT_AMBULATORY_CARE_PROVIDER_SITE_OTHER): Payer: Self-pay

## 2023-07-07 ENCOUNTER — Ambulatory Visit (HOSPITAL_BASED_OUTPATIENT_CLINIC_OR_DEPARTMENT_OTHER): Payer: Medicare Other

## 2023-07-11 ENCOUNTER — Ambulatory Visit (HOSPITAL_BASED_OUTPATIENT_CLINIC_OR_DEPARTMENT_OTHER): Payer: Medicare Other | Attending: Sports Medicine

## 2023-07-11 ENCOUNTER — Telehealth (HOSPITAL_BASED_OUTPATIENT_CLINIC_OR_DEPARTMENT_OTHER): Payer: Self-pay

## 2023-07-11 DIAGNOSIS — M25551 Pain in right hip: Secondary | ICD-10-CM | POA: Insufficient documentation

## 2023-07-11 NOTE — Telephone Encounter (Signed)
Called and left VM regarding missed appt today. Left details regarding next appt day/time and to call if she needs to cancel.

## 2023-07-12 ENCOUNTER — Other Ambulatory Visit: Payer: Medicare Other

## 2023-07-12 DIAGNOSIS — K5901 Slow transit constipation: Secondary | ICD-10-CM

## 2023-07-12 DIAGNOSIS — R3915 Urgency of urination: Secondary | ICD-10-CM

## 2023-07-12 DIAGNOSIS — T148XXA Other injury of unspecified body region, initial encounter: Secondary | ICD-10-CM

## 2023-07-12 LAB — CBC WITH DIFFERENTIAL/PLATELET
Basophils Absolute: 0 10*3/uL (ref 0.0–0.1)
Basophils Relative: 0.6 % (ref 0.0–3.0)
Eosinophils Absolute: 0.1 10*3/uL (ref 0.0–0.7)
Eosinophils Relative: 1.4 % (ref 0.0–5.0)
HCT: 36.6 % (ref 36.0–46.0)
Hemoglobin: 12.1 g/dL (ref 12.0–15.0)
Lymphocytes Relative: 15.5 % (ref 12.0–46.0)
Lymphs Abs: 1.1 10*3/uL (ref 0.7–4.0)
MCHC: 33 g/dL (ref 30.0–36.0)
MCV: 94.1 fl (ref 78.0–100.0)
Monocytes Absolute: 0.8 10*3/uL (ref 0.1–1.0)
Monocytes Relative: 11.1 % (ref 3.0–12.0)
Neutro Abs: 5.1 10*3/uL (ref 1.4–7.7)
Neutrophils Relative %: 71.4 % (ref 43.0–77.0)
Platelets: 356 10*3/uL (ref 150.0–400.0)
RBC: 3.89 Mil/uL (ref 3.87–5.11)
RDW: 13.5 % (ref 11.5–15.5)
WBC: 7.1 10*3/uL (ref 4.0–10.5)

## 2023-07-12 LAB — COMPREHENSIVE METABOLIC PANEL
ALT: 15 U/L (ref 0–35)
AST: 19 U/L (ref 0–37)
Albumin: 4.1 g/dL (ref 3.5–5.2)
Alkaline Phosphatase: 41 U/L (ref 39–117)
BUN: 10 mg/dL (ref 6–23)
CO2: 28 mEq/L (ref 19–32)
Calcium: 9.2 mg/dL (ref 8.4–10.5)
Chloride: 98 mEq/L (ref 96–112)
Creatinine, Ser: 0.64 mg/dL (ref 0.40–1.20)
GFR: 80.81 mL/min (ref >60.00)
Glucose, Bld: 94 mg/dL (ref 70–99)
Potassium: 3.9 mEq/L (ref 3.5–5.1)
Sodium: 133 mEq/L — ABNORMAL LOW (ref 135–145)
Total Bilirubin: 0.5 mg/dL (ref 0.2–1.2)
Total Protein: 7.1 g/dL (ref 6.0–8.3)

## 2023-07-13 ENCOUNTER — Encounter: Payer: Self-pay | Admitting: Internal Medicine

## 2023-07-13 ENCOUNTER — Ambulatory Visit (INDEPENDENT_AMBULATORY_CARE_PROVIDER_SITE_OTHER): Payer: Medicare Other | Admitting: Internal Medicine

## 2023-07-13 VITALS — BP 110/60 | HR 80 | Temp 98.6°F | Ht 60.0 in | Wt 113.0 lb

## 2023-07-13 DIAGNOSIS — E034 Atrophy of thyroid (acquired): Secondary | ICD-10-CM | POA: Diagnosis not present

## 2023-07-13 DIAGNOSIS — M545 Low back pain, unspecified: Secondary | ICD-10-CM | POA: Diagnosis not present

## 2023-07-13 DIAGNOSIS — L98499 Non-pressure chronic ulcer of skin of other sites with unspecified severity: Secondary | ICD-10-CM

## 2023-07-13 DIAGNOSIS — I959 Hypotension, unspecified: Secondary | ICD-10-CM | POA: Diagnosis not present

## 2023-07-13 DIAGNOSIS — G8929 Other chronic pain: Secondary | ICD-10-CM

## 2023-07-13 DIAGNOSIS — L98491 Non-pressure chronic ulcer of skin of other sites limited to breakdown of skin: Secondary | ICD-10-CM | POA: Diagnosis not present

## 2023-07-13 DIAGNOSIS — F4321 Adjustment disorder with depressed mood: Secondary | ICD-10-CM | POA: Diagnosis not present

## 2023-07-13 HISTORY — DX: Hypotension, unspecified: I95.9

## 2023-07-13 HISTORY — DX: Non-pressure chronic ulcer of skin of other sites with unspecified severity: L98.499

## 2023-07-13 NOTE — Assessment & Plan Note (Signed)
Soft pads on the nose Derm appt

## 2023-07-13 NOTE — Assessment & Plan Note (Addendum)
Worse. Starting Psychology visits

## 2023-07-13 NOTE — Assessment & Plan Note (Signed)
Stop Amlodipine 

## 2023-07-13 NOTE — Assessment & Plan Note (Signed)
Chronic On Levothroid brand

## 2023-07-13 NOTE — Assessment & Plan Note (Signed)
Tylenol, Tramadol prnWorse - R sciaticaWorse - R sciatica F/u w/Dr Darrick Penna

## 2023-07-13 NOTE — Progress Notes (Signed)
Subjective:  Patient ID: [name], female    DOB: 01-Feb-1936  Age: 87 y.o. MRN: [id number]  CC: Follow-up (3 mnth f/u)   HPI [name] presents for OA, [name], HTN, dyslipidemia C/o low BP, dizzy at times L nose ulcer  Outpatient Medications Prior to Visit  Medication Sig Dispense Refill   acetaminophen (TYLENOL) 500 MG tablet Take 1,000 mg by mouth every 6 (six) hours as needed for mild pain.     albuterol (VENTOLIN HFA) 108 (90 Base) MCG/ACT inhaler Take 2 puffs every 4-6 hours as needed     Azelastine HCl 0.15 % SOLN Place into both nostrils. Place into both nostrils.     Calcium Carbonate-Vitamin D (CALCIUM CARBONATE W/VITAMIN D PO) Take 1 tablet by mouth daily.     cefUROXime (CEFTIN) 250 MG tablet Take 1 tablet (250 mg total) by mouth 2 (two) times daily with a meal. 14 tablet 0   desonide (DESOWEN) 0.05 % cream Apply 1 application topically as needed.     diclofenac Sodium (VOLTAREN) 1 % GEL APPLY 4 GRAMS 4 TIMES DAILY. 200 g 3   EPINEPHrine (EPIPEN 2-PAK) 0.3 mg/0.3 mL IJ SOAJ injection Inject 0.3 mLs (0.3 mg total) into the muscle as needed. 1 Device 1   erythromycin ophthalmic ointment Place 1 Application into both eyes at bedtime. 3.5 g 0   famotidine (PEPCID) 40 MG tablet Take 1 tablet (40 mg total) by mouth at bedtime. NEEDS OFFICE VISIT FOR ADDITIONAL REFILLS 30 tablet 2   ipratropium (ATROVENT) 0.06 % nasal spray Place 1 spray into the nose 3 (three) times daily as needed for rhinitis. 15 mL 0   levocetirizine (XYZAL) 5 MG tablet Take 5 mg by mouth every evening.     levothyroxine (SYNTHROID) 50 MCG tablet TAKE ONE TABLET BY MOUTH DAILY BEFORE BREAKFAST 30 tablet 5   loratadine (CLARITIN) 10 MG tablet Take 1 tablet (10 mg total) by mouth daily. 100 tablet 3   LORazepam (ATIVAN) 1 MG tablet Take 2-3 tablets at bedtime and 1-2 tablets in the daytime as needed. 150 tablet 2   memantine (NAMENDA) 5 MG tablet Take 5 mg by mouth 2 (two) times daily.     montelukast  (SINGULAIR) 10 MG tablet Take 1 tablet (10 mg total) by mouth at bedtime. 90 tablet 1   oxymetazoline (AFRIN 12 HOUR) 0.05 % nasal spray Place 1 spray into both nostrils daily at 2 am.     polyethylene glycol powder (GLYCOLAX/MIRALAX) 17 GM/SCOOP powder Take 17 g by mouth 2 (two) times daily as needed for moderate constipation. 500 g 3   predniSONE (DELTASONE) 20 MG tablet Take 1 tablet (20 mg total) by mouth 2 (two) times daily. 10 tablet 0   psyllium (METAMUCIL SMOOTH TEXTURE) 58.6 % powder 1 scoop daily 283 g 6   simvastatin (ZOCOR) 40 MG tablet TAKE ONE TABLET ONCE DAILY 90 tablet 3   solifenacin (VESICARE) 10 MG tablet Take 1 tablet (10 mg total) by mouth daily as needed (bladder). 90 tablet 3   traMADol (ULTRAM) 50 MG tablet Take 1 tablet (50 mg total) by mouth every 6 (six) hours as needed for severe pain. 120 tablet 2   triamcinolone (NASACORT) 55 MCG/ACT AERO nasal inhaler Place 2 sprays into the nose as needed.     amLODipine (NORVASC) 2.5 MG tablet Take 1 tablet (2.5 mg total) by mouth daily. 30 tablet 11   methylPREDNISolone (MEDROL DOSEPAK) 4 MG TBPK tablet As directed 21 tablet  0   Gauze Pads & Dressings (TELFA ADHESIVE DRESSING) 3"X4" PADS Use qd (Patient not taking: Reported on 04/06/2023) 20 each 0   No facility-administered medications prior to visit.    ROS: Review of Systems  Constitutional:  Positive for fatigue. Negative for activity change, appetite change, chills and unexpected weight change.  HENT:  Negative for congestion, mouth sores and sinus pressure.   Eyes:  Negative for visual disturbance.  Respiratory:  Negative for cough and chest tightness.   Gastrointestinal:  Negative for abdominal pain and nausea.  Genitourinary:  Negative for difficulty urinating, frequency and vaginal pain.  Musculoskeletal:  Positive for arthralgias and back pain. Negative for gait problem.  Skin:  Negative for pallor and rash.  Neurological:  Negative for dizziness, tremors, weakness,  numbness and headaches.  Hematological:  Bruises/bleeds easily.  Psychiatric/Behavioral:  Positive for dysphoric mood and sleep disturbance. Negative for confusion and suicidal ideas. The patient is nervous/anxious.     Objective:  BP 110/60 (BP Location: Left Arm, Patient Position: Sitting, Cuff Size: Normal)   Pulse 80   Temp 98.6 F (37 C) (Oral)   Ht 5' (1.524 m)   Wt 113 lb (51.3 kg)   SpO2 95%   BMI 22.07 kg/m   BP Readings from Last 3 Encounters:  07/13/23 110/60  06/22/23 130/68  05/26/23 128/80    Wt Readings from Last 3 Encounters:  07/13/23 113 lb (51.3 kg)  06/22/23 116 lb (52.6 kg)  04/26/23 113 lb (51.3 kg)    Physical Exam Constitutional:      General: She is not in acute distress.    Appearance: Normal appearance. She is well-developed.  HENT:     Head: Normocephalic.     Right Ear: External ear normal.     Left Ear: External ear normal.     Nose: Nose normal.  Eyes:     General:        Right eye: No discharge.        Left eye: No discharge.     Conjunctiva/sclera: Conjunctivae normal.     Pupils: Pupils are equal, round, and reactive to light.  Neck:     Thyroid: No thyromegaly.     Vascular: No JVD.     Trachea: No tracheal deviation.  Cardiovascular:     Rate and Rhythm: Normal rate and regular rhythm.     Heart sounds: Normal heart sounds.  Pulmonary:     Effort: No respiratory distress.     Breath sounds: No stridor. No wheezing.  Abdominal:     General: Bowel sounds are normal. There is no distension.     Palpations: Abdomen is soft. There is no mass.     Tenderness: There is no abdominal tenderness. There is no guarding or rebound.  Musculoskeletal:        General: Tenderness present.     Cervical back: Normal range of motion and neck supple. No rigidity.     Right lower leg: No edema.     Left lower leg: No edema.  Lymphadenopathy:     Cervical: No cervical adenopathy.  Skin:    Findings: No erythema or rash.  Neurological:      Cranial Nerves: No cranial nerve deficit.     Motor: No abnormal muscle tone.     Coordination: Coordination abnormal.     Gait: Gait abnormal.     Deep Tendon Reflexes: Reflexes normal.  Psychiatric:        Behavior: Behavior normal.  Thought Content: Thought content normal.        Judgment: Judgment normal.   Antalgic gait.  Using a cane. Side effect  Lab Results  Component Value Date   WBC 7.1 07/12/2023   HGB 12.1 07/12/2023   HCT 36.6 07/12/2023   PLT 356.0 07/12/2023   GLUCOSE 94 07/12/2023   CHOL 164 04/01/2023   TRIG 114.0 04/01/2023   HDL 51.60 04/01/2023   LDLDIRECT 98.0 08/13/2021   LDLCALC 90 04/01/2023   ALT 15 07/12/2023   AST 19 07/12/2023   NA 133 (L) 07/12/2023   K 3.9 07/12/2023   CL 98 07/12/2023   CREATININE 0.64 07/12/2023   BUN 10 07/12/2023   CO2 28 07/12/2023   TSH 1.29 03/16/2023   HGBA1C 5.5 06/13/2020    ECHOCARDIOGRAM COMPLETE  Result Date: 02/11/2023    ECHOCARDIOGRAM REPORT   Patient Name:   ARMANIE SOLECKI Date of Exam: 02/11/2023 Medical Rec #:  [id number]     Height:       60.5 in Accession #:    [id number]    Weight:       116.0 lb Date of Birth:  05-30-1936      BSA:          1.490 m Patient Age:    86 years      BP:           133/76 mmHg Patient Gender: F             HR:           73 bpm. Exam Location:  Outpatient Procedure: 2D Echo, Color Doppler and Cardiac Doppler Indications:    I50.9* Heart failure (unspecified)  History:        Patient has prior history of Echocardiogram examinations, most                 recent 06/25/2022. Risk Factors:Dyslipidemia.  Sonographer:    Irving Burton Senior RDCS Referring Phys: [id number] DANIEL R BENSIMHON IMPRESSIONS  1. Left ventricular ejection fraction, by estimation, is 55 to 60%. The left ventricle has normal function. The left ventricle has no regional wall motion abnormalities. Left ventricular diastolic parameters are consistent with Grade I diastolic dysfunction (impaired relaxation).  2. Right ventricular  systolic function is normal. The right ventricular size is normal. There is normal pulmonary artery systolic pressure.  3. The mitral valve is normal in structure. Mild mitral valve regurgitation. No evidence of mitral stenosis.  4. The aortic valve is tricuspid. There is mild calcification of the aortic valve. Aortic valve regurgitation is mild. Aortic valve sclerosis/calcification is present, without any evidence of aortic stenosis. Aortic regurgitation PHT measures 436 msec. Aortic valve area, by VTI measures 1.81 cm. Aortic valve mean gradient measures 5.0 mmHg. Aortic valve Vmax measures 1.45 m/s.  5. The inferior vena cava is normal in size with greater than 50% respiratory variability, suggesting right atrial pressure of 3 mmHg. FINDINGS  Left Ventricle: Left ventricular ejection fraction, by estimation, is 55 to 60%. The left ventricle has normal function. The left ventricle has no regional wall motion abnormalities. The left ventricular internal cavity size was normal in size. There is  no left ventricular hypertrophy. Left ventricular diastolic parameters are consistent with Grade I diastolic dysfunction (impaired relaxation). Right Ventricle: The right ventricular size is normal. No increase in right ventricular wall thickness. Right ventricular systolic function is normal. There is normal pulmonary artery systolic pressure. The tricuspid regurgitant velocity is 2.35 m/s, and  with an assumed right atrial pressure of 3 mmHg, the estimated right ventricular systolic pressure is 25.1 mmHg. Left Atrium: Left atrial size was normal in size. Right Atrium: Right atrial size was normal in size. Pericardium: There is no evidence of pericardial effusion. Mitral Valve: The mitral valve is normal in structure. Mild mitral valve regurgitation. No evidence of mitral valve stenosis. Tricuspid Valve: The tricuspid valve is normal in structure. Tricuspid valve regurgitation is not demonstrated. No evidence of tricuspid  stenosis. Aortic Valve: The aortic valve is tricuspid. There is mild calcification of the aortic valve. Aortic valve regurgitation is mild. Aortic regurgitation PHT measures 436 msec. Aortic valve sclerosis/calcification is present, without any evidence of aortic stenosis. Aortic valve mean gradient measures 5.0 mmHg. Aortic valve peak gradient measures 8.4 mmHg. Aortic valve area, by VTI measures 1.81 cm. Pulmonic Valve: The pulmonic valve was normal in structure. Pulmonic valve regurgitation is trivial. No evidence of pulmonic stenosis. Aorta: The aortic root is normal in size and structure. Venous: The inferior vena cava is normal in size with greater than 50% respiratory variability, suggesting right atrial pressure of 3 mmHg. IAS/Shunts: No atrial level shunt detected by color flow Doppler.  LEFT VENTRICLE PLAX 2D LVIDd:         4.00 cm   Diastology LVIDs:         2.90 cm   LV e' medial:    5.44 cm/s LV PW:         0.80 cm   LV E/e' medial:  8.4 LV IVS:        0.70 cm   LV e' lateral:   4.90 cm/s LVOT diam:     1.95 cm   LV E/e' lateral: 9.3 LV SV:         51 LV SV Index:   34 LVOT Area:     2.99 cm  RIGHT VENTRICLE RV S prime:     13.50 cm/s TAPSE (M-mode): 1.6 cm LEFT ATRIUM             Index        RIGHT ATRIUM           Index LA diam:        2.60 cm 1.74 cm/m   RA Area:     13.40 cm LA Vol (A2C):   43.7 ml 29.33 ml/m  RA Volume:   30.70 ml  20.60 ml/m LA Vol (A4C):   25.6 ml 17.18 ml/m LA Biplane Vol: 33.1 ml 22.21 ml/m  AORTIC VALVE AV Area (Vmax):    1.78 cm AV Area (Vmean):   1.81 cm AV Area (VTI):     1.81 cm AV Vmax:           145.00 cm/s AV Vmean:          101.000 cm/s AV VTI:            0.282 m AV Peak Grad:      8.4 mmHg AV Mean Grad:      5.0 mmHg LVOT Vmax:         86.60 cm/s LVOT Vmean:        61.200 cm/s LVOT VTI:          0.171 m LVOT/AV VTI ratio: 0.61 AI PHT:            436 msec  AORTA Ao Root diam: 2.90 cm Ao Asc diam:  2.90 cm MITRAL VALVE  TRICUSPID VALVE MV Area  (PHT): 3.48 cm       TR Peak grad:   22.1 mmHg MV Decel Time: 218 msec       TR Vmax:        235.00 cm/s MR Peak grad:    114.5 mmHg MR Mean grad:    64.0 mmHg    SHUNTS MR Vmax:         535.00 cm/s  Systemic VTI:  0.17 m MR Vmean:        360.0 cm/s   Systemic Diam: 1.95 cm MR PISA:         1.01 cm MR PISA Eff ROA: 7 mm MR PISA Radius:  0.40 cm MV E velocity: 45.50 cm/s MV A velocity: 85.10 cm/s MV E/A ratio:  0.53 Arvilla Meres MD Electronically signed by Arvilla Meres MD Signature Date/Time: 02/11/2023/2:06:19 PM    Final     Assessment & Plan:   Problem List Items Addressed This Visit     Hypothyroidism    Chronic On Levothroid brand      Relevant Orders   CBC with Differential/Platelet   Comprehensive metabolic panel   TSH   Lipid panel   Low back pain - Primary    Tylenol, Tramadol prnWorse - R sciaticaWorse - R sciatica F/u w/Dr Darrick Penna        Relevant Orders   CBC with Differential/Platelet   Comprehensive metabolic panel   TSH   Lipid panel   Grief    Worse. Starting Psychology visits      Hypotension    Stop Amlodipine      Relevant Orders   CBC with Differential/Platelet   Comprehensive metabolic panel   TSH   Lipid panel   Skin ulcer of nose (HCC)    Soft pads on the nose Derm appt         No orders of the defined types were placed in this encounter.     Follow-up: Return in about 3 months (around 10/13/2023) for a follow-up visit.  Sonda Primes, MD

## 2023-07-15 ENCOUNTER — Encounter (HOSPITAL_BASED_OUTPATIENT_CLINIC_OR_DEPARTMENT_OTHER): Payer: Self-pay

## 2023-07-15 ENCOUNTER — Ambulatory Visit (HOSPITAL_BASED_OUTPATIENT_CLINIC_OR_DEPARTMENT_OTHER): Payer: Medicare Other

## 2023-07-15 DIAGNOSIS — M25551 Pain in right hip: Secondary | ICD-10-CM | POA: Diagnosis not present

## 2023-07-15 NOTE — Therapy (Signed)
OUTPATIENT PHYSICAL THERAPY TREATMENT   Patient Name: [name] MRN: [id number] DOB:May 07, 1936, 87 y.o., female Today's Date: 07/15/2023  END OF SESSION:  PT End of Session - 07/15/23 1304     Visit Number 9    Number of Visits 23    Date for PT Re-Evaluation 08/20/23    PT Start Time 1305    PT Stop Time 1346    PT Time Calculation (min) 41 min    Equipment Utilized During Treatment Gait belt    Activity Tolerance Patient tolerated treatment well    Behavior During Therapy WFL for tasks assessed/performed                   Past Medical History:  Diagnosis Date   Allergic rhinitis    Asthma    Atrophic vaginitis    Baker's cyst    Left-Dr. Aluisio   Cataract    Dr. Dione Booze   CHF (congestive heart failure) (HCC)    Colon polyps    Fibroid    Gastritis    chronic   GERD (gastroesophageal reflux disease)    Heart murmur    Hemorrhoids    Hyperlipidemia    Hypothyroidism    IBS (irritable bowel syndrome)    Lumbar spondylosis    MVP (mitral valve prolapse)    Antibiotics required for dental procedures   OA (osteoarthritis)    Osteoporosis 06/2018   T score -2.2 stable on Prolia   Overactive bladder    Sacroiliitis (HCC)    Thyroid disease    Thyroid nodule    Torn meniscus    bilateral   Past Surgical History:  Procedure Laterality Date   CATARACT EXTRACTION, BILATERAL     EYE SURGERY     POSTERIOR CERVICAL FUSION/FORAMINOTOMY N/A 05/15/2021   Procedure: Cervical One Laminectomy with resection of cyst, Fixation from Occiput to Cervical Four;  Surgeon: [name], MD;  Location: [facility] OR;  Service: Neurosurgery;  Laterality: N/A;   TONSILLECTOMY     Patient Active Problem List   Diagnosis Date Noted   Hypotension 07/13/2023   Skin ulcer of nose (HCC) 07/13/2023   Chalazion left upper eyelid 04/18/2023   Bruising 04/06/2023   Leg wound, right, initial encounter 01/17/2023   Pain of right sacroiliac joint 01/14/2023   Lumbar degenerative disc  disease 12/21/2022   Chronic left SI joint pain 11/30/2022   Inflammation of sacroiliac joint (HCC) 11/16/2022   Stool incontinence 11/16/2022   Memory impairment 11/08/2022   Grief 10/06/2022   History of basal cell carcinoma 09/29/2022   Open forehead wound 07/21/2022   Stress at home 07/21/2022   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   Gait disorder 02/28/2022   History of malignant neoplasm of skin 02/22/2022   Melanocytic nevi of trunk 02/22/2022   Seborrheic dermatitis 02/22/2022   Urticaria 02/22/2022   Irregular bowel habits 11/18/2021   Scalp laceration, sequela 11/02/2021   Concussion 10/29/2021   Scalp itch 08/19/2021   Allergic rhinitis due to animal (cat) (dog) hair and dander 08/07/2021   Allergic rhinitis due to pollen 08/07/2021   Mild intermittent asthma 08/07/2021   Closed fracture of first cervical vertebra (HCC) 08/07/2021   Closed fracture of second cervical vertebra (HCC) 08/07/2021   Cervical spine instability 05/15/2021   Closed 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 05/13/2021   Cervical spinal cord compression (HCC) 05/04/2021   Fall against object 03/25/2021   Hematoma of face, initial encounter 03/25/2021   Degenerative disc disease,  cervical 03/11/2021   Spondylolisthesis, cervical region 03/11/2021   Trochanteric bursitis of right hip 09/22/2020   Elevated blood-pressure reading, without diagnosis of hypertension 07/14/2020   Meningioma (HCC) 03/25/2020   Arthralgia 03/18/2020   RUQ pain 01/02/2020   Cholelithiasis 09/09/2019   Ear pain, bilateral 03/21/2019   Cervical spondylosis 01/05/2019   Neck pain 12/26/2018   Acute pain of left shoulder 12/26/2018   Anxiety 08/22/2018   Contact dermatitis and eczema 06/14/2018   Constipation 11/11/2016   Low back pain 11/10/2016   Leg abrasion 08/12/2016   Contusion of right knee 08/12/2016   Sinusitis, chronic 08/06/2016   Rash and nonspecific skin eruption 04/20/2016    Aortic valve sclerosis 03/12/2015   Contusion of left chest wall 01/09/2015   Fatigue 09/08/2014   Bladder pain 08/19/2014   Increased frequency of urination 08/19/2014   Urinary urgency 08/19/2014   Postherpetic neuralgia 10/02/2013   Impacted cerumen of right ear 07/16/2013   Headache 07/10/2013   Aortic stenosis 04/19/2013   Insomnia 01/24/2013   Cough 08/07/2012   Upper respiratory infection 08/03/2012   Pruritus of skin 03/11/2012   MVP (mitral valve prolapse)    Multiple thyroid nodules 10/04/2011   Herpes zoster 09/07/2011   Essential hypertension, benign 07/27/2011   Atrophic vaginitis    Well adult exam 03/15/2011   Hyperlipemia, mixed 03/15/2011   OAB (overactive bladder) 09/02/2010   Hypothyroidism 03/03/2010   CALF PAIN, LEFT 10/27/2009   Belching 07/21/2009   EAR PAIN 12/19/2008   Allergic rhinitis 12/19/2008   Irritable bowel syndrome 09/04/2008   PERSONAL HX COLONIC POLYPS 09/04/2008   GASTRITIS, CHRONIC 09/03/2008   DUODENITIS, WITHOUT HEMORRHAGE 09/03/2008   TIBIALIS TENDINITIS 03/20/2008   Osteoarthritis 02/25/2008   SWEATING 02/21/2008   LACTOSE INTOLERANCE 11/20/2007   Dyslipidemia 11/20/2007   GERD 11/20/2007   Osteoporosis 11/20/2007    REFERRING PROVIDER:  Enid Baas, MD    REFERRING DIAG:  [id number] (ICD-10-CM) - Right hip pain  M53.3 (ICD-10-CM) - Pain of right sacroiliac joint    Rationale for Evaluation and Treatment: Rehabilitation  THERAPY DIAG:  Right hip pain  ONSET DATE: months ago started at lumbar spine and worked its way to toes   SUBJECTIVE:                                                                                                                                                                                           SUBJECTIVE STATEMENT:  Pt reports her trip went well and she stayed on top of her exercises. She has minimal hip tightness and reports central low back pain at nighttime.    PERTINENT  HISTORY:   Osteoperosis, h/o cervical fx, chronic SIJ pain, poor balance  PAIN:  Are you having pain? Yes 3-4/10 in low back  PRECAUTIONS:  Fall  WEIGHT BEARING RESTRICTIONS:  No  FALLS:  Has patient fallen in last 6 months? Yes. Number of falls 1  LIVING ENVIRONMENT: Lives with: lives alone   OCCUPATION:  retired  PLOF:  Independent  PATIENT GOALS:  Improve balance, decr leg heaviness   OBJECTIVE:   DIAGNOSTIC FINDINGS:  DG 02/01/23 Lumbar:  Grade 1 anterolisthesis of L4 versus L5. Grade 1 retrolisthesis of L1 versus L2 and L2 versus L3. No other malalignment. Multilevel degenerative disc disease with loss of disc height at multiple levels and vacuum disc phenomena at multiple levels. Severe lower lumbar facet degenerative changes are noted. Calcified atherosclerotic changes in the abdominal aorta and iliac vessels. Hip:. Visualized pelvic ring is intact. Degenerative changes of the right hip joint are noted. No acute fracture or dislocation is seen. No soft tissue abnormality is noted.   PATIENT SURVEYS:  FOTO 28 FOTO closed on 7/22  COGNITIVE STATUS: Within functional limits for tasks assessed  7/22: pt states that this is only her 2nd visit to PT and does not recall the other appointments when I advise her that today is visit 7.   RED FLAGS: None    SENSATION: WFL   GAIT: Assistive device utilized: Walker - 4 wheeled Level of assistance: Modified independence Comments: Rt trendelenburg  7/22: ambulating with SPC, no trunk mobility or rotation in gait, slow cadence, very minimal trendelenburg.   Body Part #1 Lumbar   LE MMT LOWER EXTREMITY MMT:    MMT (lb) Right eval Left eval Rt/Lt 7/22  Hip flexion 19.3 16.6   Hip extension     Hip abduction 5.1 11.5 7.2/13.0  Hip adduction     Hip internal rotation     Hip external rotation     Knee flexion 16.7 13.3 16.2/16.2  Knee extension 17.2 12.3 24.0/21.7  Ankle dorsiflexion     Ankle plantarflexion      Ankle inversion     Ankle eversion      (Blank rows = not tested)   BERG: 05/16/23 (see flowsheet for full test): 40/56 7/22: 44/56 (see flowsheet for full test)  TREATMENT:                                                                                                                                DATE: 8/16 -Nu-step L4 UE and Les LAQ 3#- 3" hold 2x10bil Seated marching 3lbs 2x10ea  Heel raises 2x10 Side stepping at rail with RTB at ankles x 1lap Staggered sit to stands 2x10  Stair climbing- full flight- reciprocal- single rail use (SBA) Marching 1/2 hall, then retro walking 1/2 hall x2 (SBA/CGA with gait belt)   DATE: 7/29 -Nu-step L4 UE and Les -Manual stretching of R piriformis and R ITB -DKTC- 30sec x3 LTR- 5"  x10ea  LAQ 3#- 3" hold 2x10R Seated marching 3lbs 2x20 Sidelying hip abduction 2x10 bil  Heel raises 2x10 Staggered sit to stands x10  Stair climbing- full flight- reciprocal- single rail use (SBA)     PATIENT EDUCATION:  Education details: Anatomy of condition, POC, HEP, exercise form/rationale Person educated: Patient Education method: Explanation, Demonstration, Tactile cues, Verbal cues, and Handouts Education comprehension: verbalized understanding, returned demonstration, verbal cues required, tactile cues required, and needs further education  HOME EXERCISE PROGRAM: [id number]   ASSESSMENT:  CLINICAL IMPRESSION: Pt again able to climb full flight of stairs, though requires use of L UE on rail in order to complete. Some "pulling" in low back with seated marches, but this resolved after cues to decrease range and to engage core. She was able to complete increased repetitions with staggered sit to stands today. Reported that resisted sidestepping was more challenging when leading with L LE vs R. Encouraged pt to utilize nu-step at [facility] for added exercise outside of PT clinic. Will continue to progress as tolerated with core and  hip strengthening to improve ability for functional tasks.    OBJECTIVE IMPAIRMENTS: decreased activity tolerance, decreased balance, difficulty walking, decreased ROM, decreased strength, increased muscle spasms, improper body mechanics, postural dysfunction, and pain.   ACTIVITY LIMITATIONS: carrying, lifting, bending, standing, squatting, stairs, and locomotion level  PARTICIPATION LIMITATIONS: shopping and community activity  PERSONAL FACTORS:  see PMH  are also affecting patient's functional outcome.   REHAB POTENTIAL: Good  CLINICAL DECISION MAKING: Evolving/moderate complexity  EVALUATION COMPLEXITY: Moderate   GOALS: Goals reviewed with patient? Yes  SHORT TERM GOALS: Target date: 06/04/23  Able to demo good contraction of hip abductors in [facility] Baseline: Goal status: IN PROGRESS 7/15  2.  Independent with HEP as it has been established Baseline:  Goal status: MET 7/15    LONG TERM GOALS: Target date: POC date  Meet FOTO goal Baseline:  Goal status: deferred  2.  Resolution of trendelenburg  Baseline:  Goal status: ongoing  3.  BERG to improve by MDC Baseline:  Goal status:ongoing  4.  Navigate stairs step over step and with minimal to no discomfort Baseline:  Goal status:ongoing    PLAN:  PT FREQUENCY: 1-2x/week  PT DURATION: 8 weeks  PLANNED INTERVENTIONS: Therapeutic exercises, Therapeutic activity, Neuromuscular re-education, Balance training, Gait training, Patient/Family education, Self Care, Joint mobilization, Stair training, Aquatic Therapy, Dry Needling, Electrical stimulation, Spinal mobilization, Cryotherapy, Moist heat, Taping, Traction, Ultrasound, Ionotophoresis 4mg /ml Dexamethasone, Manual therapy, and Re-evaluation.  PLAN FOR NEXT SESSION: continue to progress lumbopelvic strength  Riki Altes, PTA  07/15/23 4:21 PM

## 2023-07-18 ENCOUNTER — Encounter: Payer: Self-pay | Admitting: Internal Medicine

## 2023-07-18 ENCOUNTER — Ambulatory Visit (HOSPITAL_BASED_OUTPATIENT_CLINIC_OR_DEPARTMENT_OTHER): Payer: Medicare Other

## 2023-07-19 NOTE — Telephone Encounter (Signed)
Message left to return call to Emily at [phone].   

## 2023-07-21 ENCOUNTER — Ambulatory Visit (HOSPITAL_BASED_OUTPATIENT_CLINIC_OR_DEPARTMENT_OTHER): Payer: Medicare Other | Admitting: Physical Therapy

## 2023-07-24 ENCOUNTER — Telehealth: Payer: Self-pay | Admitting: Physician Assistant

## 2023-07-24 NOTE — Telephone Encounter (Signed)
Thanks [employer], thanks in advance for checking on her

## 2023-07-24 NOTE — Telephone Encounter (Signed)
Telephone call to on call service today  Felt like had to move bowels yesterday but couldn't go- manually disimpacted a little yesterday but still feels the same- friends WIth Dr. Corinda Gubler who sad she may be impacted.  Last night took two senna laxatives, this morning took some metamucil.  Recommend that she buy two fleets enemas and use those now, followed by miralax two doses after that.  Discussed that if she starts with abdominal distension or nausea/ vomiting she would need to come to the ER for manual disimpaction.   Patient will call and let us know how she is doing tomorrow.   [facility], please follow up with her and make sure she is feeling better)  Hyacinth Meeker, PA-C

## 2023-07-25 ENCOUNTER — Encounter: Payer: Self-pay | Admitting: Nurse Practitioner

## 2023-07-25 ENCOUNTER — Encounter: Payer: Self-pay | Admitting: Physician Assistant

## 2023-07-25 ENCOUNTER — Telehealth: Payer: Self-pay | Admitting: Nurse Practitioner

## 2023-07-25 ENCOUNTER — Ambulatory Visit (HOSPITAL_BASED_OUTPATIENT_CLINIC_OR_DEPARTMENT_OTHER): Payer: Medicare Other

## 2023-07-25 ENCOUNTER — Ambulatory Visit (INDEPENDENT_AMBULATORY_CARE_PROVIDER_SITE_OTHER): Payer: Medicare Other | Admitting: Nurse Practitioner

## 2023-07-25 VITALS — BP 126/80 | HR 92 | Ht 60.0 in | Wt 113.0 lb

## 2023-07-25 DIAGNOSIS — K5900 Constipation, unspecified: Secondary | ICD-10-CM

## 2023-07-25 DIAGNOSIS — K648 Other hemorrhoids: Secondary | ICD-10-CM | POA: Diagnosis not present

## 2023-07-25 MED ORDER — HYDROCORTISONE (PERIANAL) 2.5 % EX CREA: 1.0000 | TOPICAL_CREAM | Freq: Two times a day (BID) | CUTANEOUS | 1 refills | Status: DC

## 2023-07-25 NOTE — Telephone Encounter (Signed)
Patient called into the office this morning. Pt reports that she was able to have a small BM after following Jennifer's recommendations below. Patient reports that her rectum is sore due to attempting to manually disimpact herself. Pt requested to be seen this week by Dr. Marina Goodell, no availability. Pt has been scheduled to see Willette Cluster, NP today at 11:30 am. Pt is aware that Dr. Marina Goodell is still her provider and will review Paula's note from today. Pt verbalized understanding and had no concerns at the end of the call.

## 2023-07-25 NOTE — Telephone Encounter (Signed)
Patient called states she was seen today but forgot to ask if she is to continue the Fleet enema [name] Meeker PA-C recommended she take or just do the Miralax. Please advise. Thanks

## 2023-07-25 NOTE — Progress Notes (Signed)
Noted. Thanks.

## 2023-07-25 NOTE — Progress Notes (Signed)
Primary GI:  [name] Flemings, MD   ASSESSMENT & PLAN   87 y.o. yo female with a past medical history consisting of, but not necessarily limited to GERD, IBS with chronic constipation, colon polyps, osteoarthritis, HTN, hypothyroidism, memory loss  Acute on chronic constipation.  Last meaningful BM was 3 days ago. Etiology unclear. Abdominal exam is reassuring. No stool in vault on DRE. Was unsuccessful at inserting fleets enemas yesterday .  --She did have a small BM this am after one dose of miralax yesterday so hopefully constipation starting to resolve. Would hold off on additional stimulants for now. Also, hold off on re-attempt at [facility] enema for now since she has anorectal discomfort from trying to disimpact herself.   --Increase Miralax to BID --Drink 6-8 glasses of water daily. --When bowels moving well she can decrease Miralax to once daily as needed.  --Asked her to contact office in a couple of days with a condition update. If not much improvement will consider bowel purge. Right now that may be a little aggressive since it has only been ~ 72 hours since last meaningful BM   Perianal discomfort. Started after her attempt to manually disimpact herself. Perianally there is a small area of bruising. No obvious fissure.  -Recommend she not try to disimpact herself.   Internal hemorrhoids. Small protruding, reducible internal hemorrhoids. On DRE the posterior wall has some areas of thickening suggesting hemorrhoidal tissue.   --Anusol cream PR BID x 7 days --I can bring her back in for an anoscopy at some point when she is feeling better   HPI   Brief GI History   Patient was last seen here in Feb 2021 for constipation, chronic RUQ pain and belching  Chief complaint : Constipation Patient called the answering service yesterday (Sunday) with complaints of constipation. She typically has a BM every day. She had a normal BM on Friday. She was unable to have a BM on Saturday  She took 2  Sennas laxatives and a dose of metamucil without relief. She tried to manually disimpact herself without much success. On Sunday our on call provider recommended Fleets enema and miralax. She was unable to insert the Fleet's enema tip but did take one dose of miralax and had a small BM this am. She called the office this am with complaints of rectal discomfort and was worked in for this appt.   Patient has had anorectal discomfort since trying to disimpact herself. She hasn't had any rectal bleeding.  No abdominal pain.  No nausea or vomiting . She doesn't know why she developed acute constipation. Denies recent medication or diet changes. I reviewed PCP' 8/14 office note. Norvasc stopped but not other apparent med changes.   Labs 07/12/23:  normal CBC, normal renal function, normal serum Ca+, normal LFTs  Last TSH was normal at 1.29 in April 2024.    Previous GI Studies   **May not be a complete list of studies  Colonoscopy July 2013 -good prep. A few scattered diverticula, small internal hemorrhoids, otherwise normal.   Labs      Latest Ref Rng & Units 07/12/2023   12:29 PM 03/16/2023   10:22 AM 12/29/2022   10:00 AM  CBC  WBC 4.0 - 10.5 K/uL 7.1  6.0  6.3   Hemoglobin 12.0 - 15.0 g/dL 16.1  09.6  04.5   Hematocrit 36.0 - 46.0 % 36.6  39.1  37.0   Platelets 150.0 - 400.0 K/uL 356.0  342.0  324.0  Latest Ref Rng & Units 07/12/2023   12:29 PM 04/01/2023   10:17 AM 03/16/2023   10:22 AM  CMP  Glucose 70 - 99 mg/dL 94  90  95   BUN 6 - 23 mg/dL 10  16  13    Creatinine 0.40 - 1.20 mg/dL 1.61  0.96  0.45   Sodium 135 - 145 mEq/L 133  132  132   Potassium 3.5 - 5.1 mEq/L 3.9  4.2  4.4   Chloride 96 - 112 mEq/L 98  96  96   CO2 19 - 32 mEq/L 28  28  26    Calcium 8.4 - 10.5 mg/dL 9.2  9.0  9.6   Total Protein 6.0 - 8.3 g/dL 7.1  7.2  8.1   Total Bilirubin 0.2 - 1.2 mg/dL 0.5  0.6  0.6   Alkaline Phos 39 - 117 U/L 41  33  36   AST 0 - 37 U/L 19  20  20    ALT 0 - 35 U/L 15  17   26      Past Medical History:  Diagnosis Date   Allergic rhinitis    Asthma    Atrophic vaginitis    Baker's cyst    Left-Dr. Aluisio   Cataract    Dr. Dione Booze   CHF (congestive heart failure) (HCC)    Colon polyps    Fibroid    Gastritis    chronic   GERD (gastroesophageal reflux disease)    Heart murmur    Hemorrhoids    Hyperlipidemia    Hypothyroidism    IBS (irritable bowel syndrome)    Lumbar spondylosis    MVP (mitral valve prolapse)    Antibiotics required for dental procedures   OA (osteoarthritis)    Osteoporosis 06/2018   T score -2.2 stable on Prolia   Overactive bladder    Sacroiliitis (HCC)    Thyroid disease    Thyroid nodule    Torn meniscus    bilateral   Past Surgical History:  Procedure Laterality Date   CATARACT EXTRACTION, BILATERAL     EYE SURGERY     POSTERIOR CERVICAL FUSION/FORAMINOTOMY N/A 05/15/2021   Procedure: Cervical One Laminectomy with resection of cyst, Fixation from Occiput to Cervical Four;  Surgeon: [name], MD;  Location: [facility] OR;  Service: Neurosurgery;  Laterality: N/A;   TONSILLECTOMY     Family History  Problem Relation Age of Onset   Heart disease Mother    Hypertension Mother    Osteoporosis Mother    Congestive Heart Failure Mother    Pancreatic cancer Father    Heart attack Brother    Drug abuse Brother        over dose    Healthy Son    Healthy Son    Colon cancer Neg Hx    Colon polyps Neg Hx    Rectal cancer Neg Hx    Stomach cancer Neg Hx    Social History   Tobacco Use   Smoking status: Former    Current packs/day: 0.00    Types: Cigarettes    Start date: 1963    Quit date: 1973    Years since quitting: 51.6    Passive exposure: Never   Smokeless tobacco: Never   Tobacco comments:    Former smoker 06/25/22  Vaping Use   Vaping status: Never Used  Substance Use Topics   Alcohol use: Yes    Comment: Socially   Drug use: No   Current Outpatient  Medications  Medication Sig Dispense Refill    acetaminophen (TYLENOL) 500 MG tablet Take 1,000 mg by mouth every 6 (six) hours as needed for mild pain.     Azelastine HCl 0.15 % SOLN Place into both nostrils. Place into both nostrils.     Calcium Carbonate-Vitamin D (CALCIUM CARBONATE W/VITAMIN D PO) Take 1 tablet by mouth daily.     cefUROXime (CEFTIN) 250 MG tablet Take 1 tablet (250 mg total) by mouth 2 (two) times daily with a meal. 14 tablet 0   desonide (DESOWEN) 0.05 % cream Apply 1 application topically as needed.     diclofenac Sodium (VOLTAREN) 1 % GEL APPLY 4 GRAMS 4 TIMES DAILY. 200 g 3   EPINEPHrine (EPIPEN 2-PAK) 0.3 mg/0.3 mL IJ SOAJ injection Inject 0.3 mLs (0.3 mg total) into the muscle as needed. 1 Device 1   erythromycin ophthalmic ointment Place 1 Application into both eyes at bedtime. 3.5 g 0   famotidine (PEPCID) 40 MG tablet Take 1 tablet (40 mg total) by mouth at bedtime. NEEDS OFFICE VISIT FOR ADDITIONAL REFILLS 30 tablet 2   Gauze Pads & Dressings (TELFA ADHESIVE DRESSING) 3"X4" PADS Use qd 20 each 0   ipratropium (ATROVENT) 0.06 % nasal spray Place 1 spray into the nose 3 (three) times daily as needed for rhinitis. 15 mL 0   levocetirizine (XYZAL) 5 MG tablet Take 5 mg by mouth every evening.     levothyroxine (SYNTHROID) 50 MCG tablet TAKE ONE TABLET BY MOUTH DAILY BEFORE BREAKFAST 30 tablet 5   loratadine (CLARITIN) 10 MG tablet Take 1 tablet (10 mg total) by mouth daily. 100 tablet 3   LORazepam (ATIVAN) 1 MG tablet Take 2-3 tablets at bedtime and 1-2 tablets in the daytime as needed. 150 tablet 2   memantine (NAMENDA) 5 MG tablet Take 5 mg by mouth 2 (two) times daily.     montelukast (SINGULAIR) 10 MG tablet Take 1 tablet (10 mg total) by mouth at bedtime. 90 tablet 1   oxymetazoline (AFRIN 12 HOUR) 0.05 % nasal spray Place 1 spray into both nostrils daily at 2 am.     polyethylene glycol powder (GLYCOLAX/MIRALAX) 17 GM/SCOOP powder Take 17 g by mouth 2 (two) times daily as needed for moderate constipation.  500 g 3   predniSONE (DELTASONE) 20 MG tablet Take 1 tablet (20 mg total) by mouth 2 (two) times daily. 10 tablet 0   psyllium (METAMUCIL SMOOTH TEXTURE) 58.6 % powder 1 scoop daily 283 g 6   simvastatin (ZOCOR) 40 MG tablet TAKE ONE TABLET ONCE DAILY 90 tablet 3   solifenacin (VESICARE) 10 MG tablet Take 1 tablet (10 mg total) by mouth daily as needed (bladder). 90 tablet 3   traMADol (ULTRAM) 50 MG tablet Take 1 tablet (50 mg total) by mouth every 6 (six) hours as needed for severe pain. 120 tablet 2   triamcinolone (NASACORT) 55 MCG/ACT AERO nasal inhaler Place 2 sprays into the nose as needed.     albuterol (VENTOLIN HFA) 108 (90 Base) MCG/ACT inhaler Take 2 puffs every 4-6 hours as needed (Patient not taking: Reported on 07/25/2023)     No current facility-administered medications for this visit.   Allergies  Allergen Reactions   Bee Venom Itching, Swelling and Rash    Itching, swelling and rash with bee stings, patient has epi pen   Bacitracin-Polymyxin B Other (See Comments)   Clarithromycin Other (See Comments)   Doxycycline     ??   Neosporin [  Neomycin-Bacitracin Zn-Polymyx]    Hydrocortisone Rash    Review of Systems: All systems reviewed and negative except where noted in HPI.   Wt Readings from Last 3 Encounters:  07/25/23 113 lb (51.3 kg)  07/13/23 113 lb (51.3 kg)  06/22/23 116 lb (52.6 kg)    Physical Exam:  Ht 5' (1.524 m)   Wt 113 lb (51.3 kg)   BMI 22.07 kg/m  Constitutional:  Pleasant, generally well appearing female in no acute distress. Psychiatric:  Normal mood and affect. Behavior is normal. EENT: Pupils normal.  Conjunctivae are normal. No scleral icterus. Neck supple.  Cardiovascular: Normal rate, regular rhythm.  Pulmonary/chest: Effort normal and breath sounds normal. No wheezing, rales or rhonchi. Abdominal: Soft, nondistended, nontender. Bowel sounds active throughout. There are no masses palpable. No hepatomegaly. Rectal : small protruding (  reducible) internal hemorrhoids. No stool in vault. No masses felt. Posterior wall with areas of thickening suggestive of internal hemorrhoidal tissue. DRE caused discomfort in posterior midline area but no fissure was seen. Anoscopy not attempted. Perianally there is a small area of ecchymosis.  Neurological: Alert and oriented to person place and time.   Willette Cluster, NP  07/25/2023, 11:30 AM

## 2023-07-25 NOTE — Patient Instructions (Addendum)
Please start taking Miralax twice daily until bowels start moving then resume to once daily. Please call us Wednesday for a update  _______________________________________________________  If your blood pressure at your visit was 140/90 or greater, please contact your primary care physician to follow up on this.  _______________________________________________________  If you are age 85 or older, your body mass index should be between 23-30. Your Body mass index is 22.07 kg/m. If this is out of the aforementioned range listed, please consider follow up with your Primary Care Provider.  If you are age 85 or younger, your body mass index should be between 19-25. Your Body mass index is 22.07 kg/m. If this is out of the aformentioned range listed, please consider follow up with your Primary Care Provider.   ________________________________________________________  The [facility] providers would like to encourage you to use [facility] to communicate with providers for non-urgent requests or questions.  Due to long hold times on the telephone, sending your provider a message by [facility] may be a faster and more efficient way to get a response.  Please allow 48 business hours for a response.  Please remember that this is for non-urgent requests.  _______________________________________________________ It was a pleasure to see you today!  Thank you for trusting me with your gastrointestinal care!

## 2023-07-25 NOTE — Telephone Encounter (Signed)
Returned call to patient. Per Paula's recommendations from today, pt is to hold-off on Fleet enemas at this time since patient is having anorectal discomfort. Pt has been advised to increased Miralax to BID, drink 6-8 glasses of water daily. Pt states that Anusol cream will be delivered to her home by her pharmacy. Pt knows to apply a pea sized amount per rectum BID. Pt verbalized understanding and had no concerns at the end of the call.

## 2023-07-26 MED ORDER — DENOSUMAB 60 MG/ML ~~LOC~~ SOSY
60.0000 mg | PREFILLED_SYRINGE | Freq: Once | SUBCUTANEOUS | Status: AC
  Administered 2023-07-27: 60 mg via SUBCUTANEOUS

## 2023-07-26 NOTE — Telephone Encounter (Signed)
Call to patient. Patient states she had a dental cleaning done over the summer, no procedures. Okay to proceed with prolia as scheduled for 8/28. Benefits reviewed with patient again and address provided to office.   Future order placed for prolia and summary of benefits scanned into Epic.   Encounter closed.

## 2023-07-27 ENCOUNTER — Telehealth: Payer: Self-pay | Admitting: Internal Medicine

## 2023-07-27 ENCOUNTER — Ambulatory Visit (INDEPENDENT_AMBULATORY_CARE_PROVIDER_SITE_OTHER): Payer: Medicare Other | Admitting: *Deleted

## 2023-07-27 DIAGNOSIS — L905 Scar conditions and fibrosis of skin: Secondary | ICD-10-CM | POA: Diagnosis not present

## 2023-07-27 DIAGNOSIS — M810 Age-related osteoporosis without current pathological fracture: Secondary | ICD-10-CM

## 2023-07-27 DIAGNOSIS — L570 Actinic keratosis: Secondary | ICD-10-CM | POA: Diagnosis not present

## 2023-07-27 DIAGNOSIS — D485 Neoplasm of uncertain behavior of skin: Secondary | ICD-10-CM | POA: Diagnosis not present

## 2023-07-27 NOTE — Telephone Encounter (Signed)
[name] the son called asking to speak with Dr. Macario Golds with his mom about memory loss plot is aware and mention something about a conference call can we please advise to see how he wants this set up to speak to the mom and the 2 brothers.

## 2023-07-27 NOTE — Telephone Encounter (Signed)
Inbound call from patient stating she is feeling better today. States yesterday was not a good day for her. States she had 2 bowel movements yesterday and a small movement this morning. Patient also requesting to know how long she should be taking miralax. Requesting a call back. Please advise, thank you.

## 2023-07-27 NOTE — Telephone Encounter (Signed)
Called patient in reference to her question about how long should she be taking the Miralax. Patient was seen yesterday via [name]. Patient was informed to take the Miralax BID and to drink 6-8 glasses of water daily until her stools were normal. Patient was also informed to take Miralax daily when no longer had constipation issues. Patient understood and agreed.

## 2023-07-28 ENCOUNTER — Ambulatory Visit (HOSPITAL_BASED_OUTPATIENT_CLINIC_OR_DEPARTMENT_OTHER): Payer: Medicare Other

## 2023-07-29 NOTE — Telephone Encounter (Signed)
We can.  However, I think, it would be best if they meet with Marlowe Kays, PA with [facility] to discuss Rozalia's memory issues and future plans with the memory expert. Thank you

## 2023-07-29 NOTE — Telephone Encounter (Signed)
Spoke with [name] and was able to inform her of Dr. Loren Racer advice. Pt stated she did inform her son to reach out to the PA as well. Pt states she is doing well other wise.

## 2023-08-02 ENCOUNTER — Encounter (HOSPITAL_BASED_OUTPATIENT_CLINIC_OR_DEPARTMENT_OTHER): Payer: Self-pay

## 2023-08-02 ENCOUNTER — Ambulatory Visit (HOSPITAL_BASED_OUTPATIENT_CLINIC_OR_DEPARTMENT_OTHER): Payer: Medicare Other | Attending: Sports Medicine

## 2023-08-02 DIAGNOSIS — M25551 Pain in right hip: Secondary | ICD-10-CM | POA: Insufficient documentation

## 2023-08-02 DIAGNOSIS — H10413 Chronic giant papillary conjunctivitis, bilateral: Secondary | ICD-10-CM | POA: Diagnosis not present

## 2023-08-02 NOTE — Therapy (Addendum)
OUTPATIENT PHYSICAL THERAPY TREATMENT   Patient Name: [name] MRN: [id number] DOB:04-20-1936, 87 y.o., female Today's Date: 08/02/2023  END OF SESSION:  PT End of Session - 08/02/23 1315     Visit Number 10    Number of Visits 23    Date for PT Re-Evaluation 08/20/23    PT Start Time 1306    PT Stop Time 1332    PT Time Calculation (min) 26 min    Activity Tolerance Patient tolerated treatment well    Behavior During Therapy WFL for tasks assessed/performed                    Past Medical History:  Diagnosis Date   Allergic rhinitis    Asthma    Atrophic vaginitis    Baker's cyst    Left-Dr. Aluisio   Cataract    Dr. Dione Booze   CHF (congestive heart failure) (HCC)    Colon polyps    Fibroid    Gastritis    chronic   GERD (gastroesophageal reflux disease)    Heart murmur    Hemorrhoids    Hyperlipidemia    Hypothyroidism    IBS (irritable bowel syndrome)    Lumbar spondylosis    MVP (mitral valve prolapse)    Antibiotics required for dental procedures   OA (osteoarthritis)    Osteoporosis 06/2018   T score -2.2 stable on Prolia   Overactive bladder    Sacroiliitis (HCC)    Thyroid disease    Thyroid nodule    Torn meniscus    bilateral   Past Surgical History:  Procedure Laterality Date   CATARACT EXTRACTION, BILATERAL     EYE SURGERY     POSTERIOR CERVICAL FUSION/FORAMINOTOMY N/A 05/15/2021   Procedure: Cervical One Laminectomy with resection of cyst, Fixation from Occiput to Cervical Four;  Surgeon: [name], MD;  Location: [facility] OR;  Service: Neurosurgery;  Laterality: N/A;   TONSILLECTOMY     Patient Active Problem List   Diagnosis Date Noted   Hypotension 07/13/2023   Skin ulcer of nose (HCC) 07/13/2023   Chalazion left upper eyelid 04/18/2023   Bruising 04/06/2023   Leg wound, right, initial encounter 01/17/2023   Pain of right sacroiliac joint 01/14/2023   Lumbar degenerative disc disease 12/21/2022   Chronic left SI joint pain  11/30/2022   Inflammation of sacroiliac joint (HCC) 11/16/2022   Stool incontinence 11/16/2022   Memory impairment 11/08/2022   Grief 10/06/2022   History of basal cell carcinoma 09/29/2022   Open forehead wound 07/21/2022   Stress at home 07/21/2022   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   Gait disorder 02/28/2022   History of malignant neoplasm of skin 02/22/2022   Melanocytic nevi of trunk 02/22/2022   Seborrheic dermatitis 02/22/2022   Urticaria 02/22/2022   Irregular bowel habits 11/18/2021   Scalp laceration, sequela 11/02/2021   Concussion 10/29/2021   Scalp itch 08/19/2021   Allergic rhinitis due to animal (cat) (dog) hair and dander 08/07/2021   Allergic rhinitis due to pollen 08/07/2021   Mild intermittent asthma 08/07/2021   Closed fracture of first cervical vertebra (HCC) 08/07/2021   Closed fracture of second cervical vertebra (HCC) 08/07/2021   Cervical spine instability 05/15/2021   Closed 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 05/13/2021   Cervical spinal cord compression (HCC) 05/04/2021   Fall against object 03/25/2021   Hematoma of face, initial encounter 03/25/2021   Degenerative disc disease, cervical 03/11/2021   Spondylolisthesis, cervical region 03/11/2021  Trochanteric bursitis of right hip 09/22/2020   Elevated blood-pressure reading, without diagnosis of hypertension 07/14/2020   Meningioma (HCC) 03/25/2020   Arthralgia 03/18/2020   RUQ pain 01/02/2020   Cholelithiasis 09/09/2019   Ear pain, bilateral 03/21/2019   Cervical spondylosis 01/05/2019   Neck pain 12/26/2018   Acute pain of left shoulder 12/26/2018   Anxiety 08/22/2018   Contact dermatitis and eczema 06/14/2018   Constipation 11/11/2016   Low back pain 11/10/2016   Leg abrasion 08/12/2016   Contusion of right knee 08/12/2016   Sinusitis, chronic 08/06/2016   Rash and nonspecific skin eruption 04/20/2016   Aortic valve sclerosis 03/12/2015   Contusion of  left chest wall 01/09/2015   Fatigue 09/08/2014   Bladder pain 08/19/2014   Increased frequency of urination 08/19/2014   Urinary urgency 08/19/2014   Postherpetic neuralgia 10/02/2013   Impacted cerumen of right ear 07/16/2013   Headache 07/10/2013   Aortic stenosis 04/19/2013   Insomnia 01/24/2013   Cough 08/07/2012   Upper respiratory infection 08/03/2012   Pruritus of skin 03/11/2012   MVP (mitral valve prolapse)    Multiple thyroid nodules 10/04/2011   Herpes zoster 09/07/2011   Essential hypertension, benign 07/27/2011   Atrophic vaginitis    Well adult exam 03/15/2011   Hyperlipemia, mixed 03/15/2011   OAB (overactive bladder) 09/02/2010   Hypothyroidism 03/03/2010   CALF PAIN, LEFT 10/27/2009   Belching 07/21/2009   EAR PAIN 12/19/2008   Allergic rhinitis 12/19/2008   Irritable bowel syndrome 09/04/2008   PERSONAL HX COLONIC POLYPS 09/04/2008   GASTRITIS, CHRONIC 09/03/2008   DUODENITIS, WITHOUT HEMORRHAGE 09/03/2008   TIBIALIS TENDINITIS 03/20/2008   Osteoarthritis 02/25/2008   SWEATING 02/21/2008   LACTOSE INTOLERANCE 11/20/2007   Dyslipidemia 11/20/2007   GERD 11/20/2007   Osteoporosis 11/20/2007    REFERRING PROVIDER:  Enid Baas, MD    REFERRING DIAG:  [phone] (ICD-10-CM) - Right hip pain  M53.3 (ICD-10-CM) - Pain of right sacroiliac joint    Rationale for Evaluation and Treatment: Rehabilitation  THERAPY DIAG:  Right hip pain  ONSET DATE: months ago started at lumbar spine and worked its way to toes   SUBJECTIVE:                                                                                                                                                                                           SUBJECTIVE STATEMENT:  Pt reports she is feeling "so/so" today. She has missed the last couple PT appts due to feeling sick. Reports mild regression in progress. "I have to leave early today."   PERTINENT HISTORY:  Osteoperosis, h/o cervical fx,  chronic  SIJ pain, poor balance  PAIN:  Are you having pain? 1/10  PRECAUTIONS:  Fall  WEIGHT BEARING RESTRICTIONS:  No  FALLS:  Has patient fallen in last 6 months? Yes. Number of falls 1  LIVING ENVIRONMENT: Lives with: lives alone   OCCUPATION:  retired  PLOF:  Independent  PATIENT GOALS:  Improve balance, decr leg heaviness   OBJECTIVE:   DIAGNOSTIC FINDINGS:  DG 02/01/23 Lumbar:  Grade 1 anterolisthesis of L4 versus L5. Grade 1 retrolisthesis of L1 versus L2 and L2 versus L3. No other malalignment. Multilevel degenerative disc disease with loss of disc height at multiple levels and vacuum disc phenomena at multiple levels. Severe lower lumbar facet degenerative changes are noted. Calcified atherosclerotic changes in the abdominal aorta and iliac vessels. Hip:. Visualized pelvic ring is intact. Degenerative changes of the right hip joint are noted. No acute fracture or dislocation is seen. No soft tissue abnormality is noted.   PATIENT SURVEYS:  FOTO 22 FOTO closed on 7/22  COGNITIVE STATUS: Within functional limits for tasks assessed  7/22: pt states that this is only her 2nd visit to PT and does not recall the other appointments when I advise her that today is visit 7.   RED FLAGS: None    SENSATION: WFL   GAIT: Assistive device utilized: Walker - 4 wheeled Level of assistance: Modified independence Comments: Rt trendelenburg  7/22: ambulating with SPC, no trunk mobility or rotation in gait, slow cadence, very minimal trendelenburg.   Body Part #1 Lumbar   LE MMT LOWER EXTREMITY MMT:    MMT (lb) Right eval Left eval Rt/Lt 7/22 Rt/lt  Hip flexion 19.3 16.6  17.9/26.1  Hip extension      Hip abduction 5.1 11.5 7.2/13.0 29.4/25.6  Hip adduction      Hip internal rotation      Hip external rotation      Knee flexion 16.7 13.3 16.2/16.2 22.4/20.2  Knee extension 17.2 12.3 24.0/21.7 25.6/23.7  Ankle dorsiflexion      Ankle plantarflexion       Ankle inversion      Ankle eversion       (Blank rows = not tested)   BERG: 05/16/23 (see flowsheet for full test): 40/56 7/22: 44/56 (see flowsheet for full test)  TREATMENT:                                                                                                                               DATE: 9/3 -Nu-step L4 UE and Les Goal review Updated strength Heel raises 2x10 Stair climbing- full flight- reciprocal- single rail use (SBA) Gait training     DATE: 8/16 -Nu-step L4 UE and Les LAQ 3#- 3" hold 2x10bil Seated marching 3lbs 2x10ea  Heel raises 2x10 Side stepping at rail with RTB at ankles x 1lap Staggered sit to stands 2x10  Stair climbing- full flight- reciprocal- single rail use (SBA) Marching 1/2 hall, then retro walking  1/2 hall x2 (SBA/CGA with gait belt)   DATE: 7/29 -Nu-step L4 UE and Les -Manual stretching of R piriformis and R ITB -DKTC- 30sec x3 LTR- 5" x10ea  LAQ 3#- 3" hold 2x10R Seated marching 3lbs 2x20 Sidelying hip abduction 2x10 bil  Heel raises 2x10 Staggered sit to stands x10  Stair climbing- full flight- reciprocal- single rail use (SBA)     PATIENT EDUCATION:  Education details: Teacher, music of condition, POC, HEP, exercise form/rationale Person educated: Patient Education method: Explanation, Demonstration, Tactile cues, Verbal cues, and Handouts Education comprehension: verbalized understanding, returned demonstration, verbal cues required, tactile cues required, and needs further education  HOME EXERCISE PROGRAM: [email or web address]   ASSESSMENT:  CLINICAL IMPRESSION: Pt has attended 10 visits of PT thus far and is making steady progress. Objective measurements show significant improvement in strength. Decreased trendelenburg with gait, though pt demonstrates minimal arm swing. Uses rollator most of time, but can ambulate without AD for short distances safely. Pt able to demonstrate reciprocal stair climbing with  single rail use safely on steps in [place]. Pt has met 1/2 STG and 1/3 LTG. She will benefit from continued gait training and strengthening program. Will complete BERG test next visit, deferred due to time constraints.    OBJECTIVE IMPAIRMENTS: decreased activity tolerance, decreased balance, difficulty walking, decreased ROM, decreased strength, increased muscle spasms, improper body mechanics, postural dysfunction, and pain.   ACTIVITY LIMITATIONS: carrying, lifting, bending, standing, squatting, stairs, and locomotion level  PARTICIPATION LIMITATIONS: shopping and community activity  PERSONAL FACTORS:  see PMH  are also affecting patient's functional outcome.   REHAB POTENTIAL: Good  CLINICAL DECISION MAKING: Evolving/moderate complexity  EVALUATION COMPLEXITY: Moderate   GOALS: Goals reviewed with patient? Yes  SHORT TERM GOALS: Target date: 06/04/23  Able to demo good contraction of hip abductors in [facility] Baseline: Goal status: IN PROGRESS 9/3 2.  Independent with HEP as it has been established Baseline:  Goal status: MET 7/15    LONG TERM GOALS: Target date: POC date  Meet FOTO goal Baseline:  Goal status: deferred  2.  Resolution of trendelenburg  Baseline:  Goal status: ongoing (minimal trendelenburg 9/3)  3.  BERG to improve by MDC Baseline:  Goal status:ongoing  4.  Navigate stairs step over step and with minimal to no discomfort Baseline:  Goal status:MET (9/3)    PLAN:  PT FREQUENCY: 1-2x/week  PT DURATION: 8 weeks  PLANNED INTERVENTIONS: Therapeutic exercises, Therapeutic activity, Neuromuscular re-education, Balance training, Gait training, Patient/Family education, Self Care, Joint mobilization, Stair training, Aquatic Therapy, Dry Needling, Electrical stimulation, Spinal mobilization, Cryotherapy, Moist heat, Taping, Traction, Ultrasound, Ionotophoresis 4mg /ml Dexamethasone, Manual therapy, and Re-evaluation.  PLAN FOR NEXT SESSION: continue to  progress lumbopelvic strength  Riki Altes, PTA  08/02/23 2:01 PM   I agree with all tests/measures performed today and support continued PT through POC.  Jessica C. Hightower PT, DPT 08/02/23 4:34 PM

## 2023-08-04 ENCOUNTER — Encounter (HOSPITAL_BASED_OUTPATIENT_CLINIC_OR_DEPARTMENT_OTHER): Payer: Self-pay

## 2023-08-04 ENCOUNTER — Ambulatory Visit (HOSPITAL_BASED_OUTPATIENT_CLINIC_OR_DEPARTMENT_OTHER): Payer: Medicare Other

## 2023-08-04 DIAGNOSIS — M25551 Pain in right hip: Secondary | ICD-10-CM | POA: Diagnosis not present

## 2023-08-04 NOTE — Therapy (Signed)
OUTPATIENT PHYSICAL THERAPY TREATMENT   Patient Name: [name] MRN: [id number] DOB:[birth year], 87 y.o., female Today's Date: 08/04/2023  END OF SESSION:  PT End of Session - 08/04/23 1622     Visit Number 11    Number of Visits 23    Date for PT Re-Evaluation 08/20/23    PT Start Time 1345    PT Stop Time 1415    PT Time Calculation (min) 30 min    Equipment Utilized During Treatment Gait belt    Activity Tolerance Patient tolerated treatment well    Behavior During Therapy WFL for tasks assessed/performed                     Past Medical History:  Diagnosis Date   Allergic rhinitis    Asthma    Atrophic vaginitis    Baker's cyst    Left-Dr. Aluisio   Cataract    Dr. Dione Booze   CHF (congestive heart failure) (HCC)    Colon polyps    Fibroid    Gastritis    chronic   GERD (gastroesophageal reflux disease)    Heart murmur    Hemorrhoids    Hyperlipidemia    Hypothyroidism    IBS (irritable bowel syndrome)    Lumbar spondylosis    MVP (mitral valve prolapse)    Antibiotics required for dental procedures   OA (osteoarthritis)    Osteoporosis 06/2018   T score -2.2 stable on Prolia   Overactive bladder    Sacroiliitis (HCC)    Thyroid disease    Thyroid nodule    Torn meniscus    bilateral   Past Surgical History:  Procedure Laterality Date   CATARACT EXTRACTION, BILATERAL     EYE SURGERY     POSTERIOR CERVICAL FUSION/FORAMINOTOMY N/A 05/15/2021   Procedure: Cervical One Laminectomy with resection of cyst, Fixation from Occiput to Cervical Four;  Surgeon: [name], MD;  Location: [facility] OR;  Service: Neurosurgery;  Laterality: N/A;   TONSILLECTOMY     Patient Active Problem List   Diagnosis Date Noted   Hypotension 07/13/2023   Skin ulcer of nose (HCC) 07/13/2023   Chalazion left upper eyelid 04/18/2023   Bruising 04/06/2023   Leg wound, right, initial encounter 01/17/2023   Pain of right sacroiliac joint 01/14/2023   Lumbar degenerative  disc disease 12/21/2022   Chronic left SI joint pain 11/30/2022   Inflammation of sacroiliac joint (HCC) 11/16/2022   Stool incontinence 11/16/2022   Memory impairment 11/08/2022   Grief 10/06/2022   History of basal cell carcinoma 09/29/2022   Open forehead wound 07/21/2022   Stress at home 07/21/2022   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   Gait disorder 02/28/2022   History of malignant neoplasm of skin 02/22/2022   Melanocytic nevi of trunk 02/22/2022   Seborrheic dermatitis 02/22/2022   Urticaria 02/22/2022   Irregular bowel habits 11/18/2021   Scalp laceration, sequela 11/02/2021   Concussion 10/29/2021   Scalp itch 08/19/2021   Allergic rhinitis due to animal (cat) (dog) hair and dander 08/07/2021   Allergic rhinitis due to pollen 08/07/2021   Mild intermittent asthma 08/07/2021   Closed fracture of first cervical vertebra (HCC) 08/07/2021   Closed fracture of second cervical vertebra (HCC) 08/07/2021   Cervical spine instability 05/15/2021   Closed 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 05/13/2021   Cervical spinal cord compression (HCC) 05/04/2021   Fall against object 03/25/2021   Hematoma of face, initial encounter 03/25/2021   Degenerative  disc disease, cervical 03/11/2021   Spondylolisthesis, cervical region 03/11/2021   Trochanteric bursitis of right hip 09/22/2020   Elevated blood-pressure reading, without diagnosis of hypertension 07/14/2020   Meningioma (HCC) 03/25/2020   Arthralgia 03/18/2020   RUQ pain 01/02/2020   Cholelithiasis 09/09/2019   Ear pain, bilateral 03/21/2019   Cervical spondylosis 01/05/2019   Neck pain 12/26/2018   Acute pain of left shoulder 12/26/2018   Anxiety 08/22/2018   Contact dermatitis and eczema 06/14/2018   Constipation 11/11/2016   Low back pain 11/10/2016   Leg abrasion 08/12/2016   Contusion of right knee 08/12/2016   Sinusitis, chronic 08/06/2016   Rash and nonspecific skin eruption 04/20/2016    Aortic valve sclerosis 03/12/2015   Contusion of left chest wall 01/09/2015   Fatigue 09/08/2014   Bladder pain 08/19/2014   Increased frequency of urination 08/19/2014   Urinary urgency 08/19/2014   Postherpetic neuralgia 10/02/2013   Impacted cerumen of right ear 07/16/2013   Headache 07/10/2013   Aortic stenosis 04/19/2013   Insomnia 01/24/2013   Cough 08/07/2012   Upper respiratory infection 08/03/2012   Pruritus of skin 03/11/2012   MVP (mitral valve prolapse)    Multiple thyroid nodules 10/04/2011   Herpes zoster 09/07/2011   Essential hypertension, benign 07/27/2011   Atrophic vaginitis    Well adult exam 03/15/2011   Hyperlipemia, mixed 03/15/2011   OAB (overactive bladder) 09/02/2010   Hypothyroidism 03/03/2010   CALF PAIN, LEFT 10/27/2009   Belching 07/21/2009   EAR PAIN 12/19/2008   Allergic rhinitis 12/19/2008   Irritable bowel syndrome 09/04/2008   PERSONAL HX COLONIC POLYPS 09/04/2008   GASTRITIS, CHRONIC 09/03/2008   DUODENITIS, WITHOUT HEMORRHAGE 09/03/2008   TIBIALIS TENDINITIS 03/20/2008   Osteoarthritis 02/25/2008   SWEATING 02/21/2008   LACTOSE INTOLERANCE 11/20/2007   Dyslipidemia 11/20/2007   GERD 11/20/2007   Osteoporosis 11/20/2007    REFERRING PROVIDER:  Enid Baas, MD    REFERRING DIAG:  [phone] (ICD-10-CM) - Right hip pain  M53.3 (ICD-10-CM) - Pain of right sacroiliac joint    Rationale for Evaluation and Treatment: Rehabilitation  THERAPY DIAG:  Right hip pain  ONSET DATE: months ago started at lumbar spine and worked its way to toes   SUBJECTIVE:                                                                                                                                                                                           SUBJECTIVE STATEMENT:  Pt reports no complaints at entry. "I have to leave in 30 mins today for a doctor's appt."    PERTINENT HISTORY:  Osteoperosis, h/o cervical fx, chronic  SIJ pain, poor  balance  PAIN:  Are you having pain? 0/10  PRECAUTIONS:  Fall  WEIGHT BEARING RESTRICTIONS:  No  FALLS:  Has patient fallen in last 6 months? Yes. Number of falls 1  LIVING ENVIRONMENT: Lives with: lives alone   OCCUPATION:  retired  PLOF:  Independent  PATIENT GOALS:  Improve balance, decr leg heaviness   OBJECTIVE:   DIAGNOSTIC FINDINGS:  DG 02/01/23 Lumbar:  Grade 1 anterolisthesis of L4 versus L5. Grade 1 retrolisthesis of L1 versus L2 and L2 versus L3. No other malalignment. Multilevel degenerative disc disease with loss of disc height at multiple levels and vacuum disc phenomena at multiple levels. Severe lower lumbar facet degenerative changes are noted. Calcified atherosclerotic changes in the abdominal aorta and iliac vessels. Hip:. Visualized pelvic ring is intact. Degenerative changes of the right hip joint are noted. No acute fracture or dislocation is seen. No soft tissue abnormality is noted.   PATIENT SURVEYS:  FOTO 75 FOTO closed on 7/22  COGNITIVE STATUS: Within functional limits for tasks assessed  7/22: pt states that this is only her 2nd visit to PT and does not recall the other appointments when I advise her that today is visit 7.   RED FLAGS: None    SENSATION: WFL   GAIT: Assistive device utilized: Walker - 4 wheeled Level of assistance: Modified independence Comments: Rt trendelenburg  7/22: ambulating with SPC, no trunk mobility or rotation in gait, slow cadence, very minimal trendelenburg.   Body Part #1 Lumbar   LE MMT LOWER EXTREMITY MMT:    MMT (lb) Right eval Left eval Rt/Lt 7/22 Rt/lt 9/3  Hip flexion 19.3 16.6  17.9/26.1  Hip extension      Hip abduction 5.1 11.5 7.2/13.0 29.4/25.6  Hip adduction      Hip internal rotation      Hip external rotation      Knee flexion 16.7 13.3 16.2/16.2 22.4/20.2  Knee extension 17.2 12.3 24.0/21.7 25.6/23.7  Ankle dorsiflexion      Ankle plantarflexion      Ankle  inversion      Ankle eversion       (Blank rows = not tested)   BERG: 05/16/23 (see flowsheet for full test): 40/56 7/22: 44/56 (see flowsheet for full test)  TREATMENT:                                                                                                                               DATE: 9/5 -Nu-step L4 UE and Les -BERG blaance test-53/56 Gait training without AD- light CGA with gait belt- 254ft   DATE: 9/3 -Nu-step L4 UE and Les Goal review Updated strength Heel raises 2x10 Stair climbing- full flight- reciprocal- single rail use (SBA) Gait training     DATE: 8/16 -Nu-step L4 UE and Les LAQ 3#- 3" hold 2x10bil Seated marching 3lbs 2x10ea  Heel raises 2x10 Side stepping at rail with RTB  at ankles x 1lap Staggered sit to stands 2x10  Stair climbing- full flight- reciprocal- single rail use (SBA) Marching 1/2 hall, then retro walking 1/2 hall x2 (SBA/CGA with gait belt)   DATE: 7/29 -Nu-step L4 UE and Les -Manual stretching of R piriformis and R ITB -DKTC- 30sec x3 LTR- 5" x10ea  LAQ 3#- 3" hold 2x10R Seated marching 3lbs 2x20 Sidelying hip abduction 2x10 bil  Heel raises 2x10 Staggered sit to stands x10  Stair climbing- full flight- reciprocal- single rail use (SBA)     PATIENT EDUCATION:  Education details: Teacher, music of condition, POC, HEP, exercise form/rationale Person educated: Patient Education method: Explanation, Demonstration, Tactile cues, Verbal cues, and Handouts Education comprehension: verbalized understanding, returned demonstration, verbal cues required, tactile cues required, and needs further education  HOME EXERCISE PROGRAM: [id number]   ASSESSMENT:  CLINICAL IMPRESSION: Completed BERG test today with pt scoring 53/56, a significant improvement form previously measured. Pt has met LTG for this. She was able to ambulate full lap in clinic with light CGA as a precaution without AD while receiving cuing  for reciprocal arm swing. Pt prefers to use rollator outside of her home as she feels safer. Pt has demonstrated improvements in functional strength and ability in clinic since starting PT. She is approaching discharge and may benefit from transition to exercise program in gym setting.    OBJECTIVE IMPAIRMENTS: decreased activity tolerance, decreased balance, difficulty walking, decreased ROM, decreased strength, increased muscle spasms, improper body mechanics, postural dysfunction, and pain.   ACTIVITY LIMITATIONS: carrying, lifting, bending, standing, squatting, stairs, and locomotion level  PARTICIPATION LIMITATIONS: shopping and community activity  PERSONAL FACTORS:  see PMH  are also affecting patient's functional outcome.   REHAB POTENTIAL: Good  CLINICAL DECISION MAKING: Evolving/moderate complexity  EVALUATION COMPLEXITY: Moderate   GOALS: Goals reviewed with patient? Yes  SHORT TERM GOALS: Target date: 06/04/23  Able to demo good contraction of hip abductors in CKC Baseline: Goal status: IN PROGRESS 9/3 2.  Independent with HEP as it has been established Baseline:  Goal status: MET 7/15    LONG TERM GOALS: Target date: POC date  Meet FOTO goal Baseline:  Goal status: deferred  2.  Resolution of trendelenburg  Baseline:  Goal status: ongoing (minimal trendelenburg 9/3)  3.  BERG to improve by MDC Baseline:  Goal status:MET 9/5  4.  Navigate stairs step over step and with minimal to no discomfort Baseline:  Goal status:MET (9/3)    PLAN:  PT FREQUENCY: 1-2x/week  PT DURATION: 8 weeks  PLANNED INTERVENTIONS: Therapeutic exercises, Therapeutic activity, Neuromuscular re-education, Balance training, Gait training, Patient/Family education, Self Care, Joint mobilization, Stair training, Aquatic Therapy, Dry Needling, Electrical stimulation, Spinal mobilization, Cryotherapy, Moist heat, Taping, Traction, Ultrasound, Ionotophoresis 4mg /ml Dexamethasone,  Manual therapy, and Re-evaluation.  PLAN FOR NEXT SESSION: continue to progress lumbopelvic strength  Riki Altes, PTA  08/04/23 4:26 PM

## 2023-08-08 ENCOUNTER — Ambulatory Visit (HOSPITAL_BASED_OUTPATIENT_CLINIC_OR_DEPARTMENT_OTHER): Payer: Medicare Other | Admitting: Physical Therapy

## 2023-08-08 ENCOUNTER — Telehealth (HOSPITAL_BASED_OUTPATIENT_CLINIC_OR_DEPARTMENT_OTHER): Payer: Self-pay | Admitting: Physical Therapy

## 2023-08-08 DIAGNOSIS — H903 Sensorineural hearing loss, bilateral: Secondary | ICD-10-CM | POA: Diagnosis not present

## 2023-08-08 NOTE — Telephone Encounter (Signed)
LVM regarding no show today and advised of attendance policy that her next no show would result in cancelling appts. Requested call back to confirm next appt.  Tilman Mcclaren C. Shammara Jarrett PT, DPT 08/08/23 1:41 PM

## 2023-08-11 ENCOUNTER — Ambulatory Visit (HOSPITAL_BASED_OUTPATIENT_CLINIC_OR_DEPARTMENT_OTHER): Payer: Medicare Other

## 2023-08-11 ENCOUNTER — Encounter (HOSPITAL_BASED_OUTPATIENT_CLINIC_OR_DEPARTMENT_OTHER): Payer: Self-pay

## 2023-08-11 DIAGNOSIS — M25551 Pain in right hip: Secondary | ICD-10-CM | POA: Diagnosis not present

## 2023-08-11 NOTE — Therapy (Signed)
OUTPATIENT PHYSICAL THERAPY TREATMENT   Patient Name: [name] MRN: [id number] DOB:[birth year], 87 y.o., female Today's Date: 08/11/2023  END OF SESSION:  PT End of Session - 08/11/23 1306     Visit Number 12    Number of Visits 23    Date for PT Re-Evaluation 08/20/23    PT Start Time 1302    PT Stop Time 1340    PT Time Calculation (min) 38 min    Equipment Utilized During Treatment Gait belt    Activity Tolerance Patient tolerated treatment well    Behavior During Therapy WFL for tasks assessed/performed                      Past Medical History:  Diagnosis Date   Allergic rhinitis    Asthma    Atrophic vaginitis    Baker's cyst    Left-Dr. Aluisio   Cataract    Dr. Dione Booze   CHF (congestive heart failure) (HCC)    Colon polyps    Fibroid    Gastritis    chronic   GERD (gastroesophageal reflux disease)    Heart murmur    Hemorrhoids    Hyperlipidemia    Hypothyroidism    IBS (irritable bowel syndrome)    Lumbar spondylosis    MVP (mitral valve prolapse)    Antibiotics required for dental procedures   OA (osteoarthritis)    Osteoporosis 06/2018   T score -2.2 stable on Prolia   Overactive bladder    Sacroiliitis (HCC)    Thyroid disease    Thyroid nodule    Torn meniscus    bilateral   Past Surgical History:  Procedure Laterality Date   CATARACT EXTRACTION, BILATERAL     EYE SURGERY     POSTERIOR CERVICAL FUSION/FORAMINOTOMY N/A 05/15/2021   Procedure: Cervical One Laminectomy with resection of cyst, Fixation from Occiput to Cervical Four;  Surgeon: [name], MD;  Location: [facility] OR;  Service: Neurosurgery;  Laterality: N/A;   TONSILLECTOMY     Patient Active Problem List   Diagnosis Date Noted   Hypotension 07/13/2023   Skin ulcer of nose (HCC) 07/13/2023   Chalazion left upper eyelid 04/18/2023   Bruising 04/06/2023   Leg wound, right, initial encounter 01/17/2023   Pain of right sacroiliac joint 01/14/2023   Lumbar  degenerative disc disease 12/21/2022   Chronic left SI joint pain 11/30/2022   Inflammation of sacroiliac joint (HCC) 11/16/2022   Stool incontinence 11/16/2022   Memory impairment 11/08/2022   Grief 10/06/2022   History of basal cell carcinoma 09/29/2022   Open forehead wound 07/21/2022   Stress at home 07/21/2022   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   Gait disorder 02/28/2022   History of malignant neoplasm of skin 02/22/2022   Melanocytic nevi of trunk 02/22/2022   Seborrheic dermatitis 02/22/2022   Urticaria 02/22/2022   Irregular bowel habits 11/18/2021   Scalp laceration, sequela 11/02/2021   Concussion 10/29/2021   Scalp itch 08/19/2021   Allergic rhinitis due to animal (cat) (dog) hair and dander 08/07/2021   Allergic rhinitis due to pollen 08/07/2021   Mild intermittent asthma 08/07/2021   Closed fracture of first cervical vertebra (HCC) 08/07/2021   Closed fracture of second cervical vertebra (HCC) 08/07/2021   Cervical spine instability 05/15/2021   Closed 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 05/13/2021   Cervical spinal cord compression (HCC) 05/04/2021   Fall against object 03/25/2021   Hematoma of face, initial encounter 03/25/2021  Degenerative disc disease, cervical 03/11/2021   Spondylolisthesis, cervical region 03/11/2021   Trochanteric bursitis of right hip 09/22/2020   Elevated blood-pressure reading, without diagnosis of hypertension 07/14/2020   Meningioma (HCC) 03/25/2020   Arthralgia 03/18/2020   RUQ pain 01/02/2020   Cholelithiasis 09/09/2019   Ear pain, bilateral 03/21/2019   Cervical spondylosis 01/05/2019   Neck pain 12/26/2018   Acute pain of left shoulder 12/26/2018   Anxiety 08/22/2018   Contact dermatitis and eczema 06/14/2018   Constipation 11/11/2016   Low back pain 11/10/2016   Leg abrasion 08/12/2016   Contusion of right knee 08/12/2016   Sinusitis, chronic 08/06/2016   Rash and nonspecific skin eruption  04/20/2016   Aortic valve sclerosis 03/12/2015   Contusion of left chest wall 01/09/2015   Fatigue 09/08/2014   Bladder pain 08/19/2014   Increased frequency of urination 08/19/2014   Urinary urgency 08/19/2014   Postherpetic neuralgia 10/02/2013   Impacted cerumen of right ear 07/16/2013   Headache 07/10/2013   Aortic stenosis 04/19/2013   Insomnia 01/24/2013   Cough 08/07/2012   Upper respiratory infection 08/03/2012   Pruritus of skin 03/11/2012   MVP (mitral valve prolapse)    Multiple thyroid nodules 10/04/2011   Herpes zoster 09/07/2011   Essential hypertension, benign 07/27/2011   Atrophic vaginitis    Well adult exam 03/15/2011   Hyperlipemia, mixed 03/15/2011   OAB (overactive bladder) 09/02/2010   Hypothyroidism 03/03/2010   CALF PAIN, LEFT 10/27/2009   Belching 07/21/2009   EAR PAIN 12/19/2008   Allergic rhinitis 12/19/2008   Irritable bowel syndrome 09/04/2008   PERSONAL HX COLONIC POLYPS 09/04/2008   GASTRITIS, CHRONIC 09/03/2008   DUODENITIS, WITHOUT HEMORRHAGE 09/03/2008   TIBIALIS TENDINITIS 03/20/2008   Osteoarthritis 02/25/2008   SWEATING 02/21/2008   LACTOSE INTOLERANCE 11/20/2007   Dyslipidemia 11/20/2007   GERD 11/20/2007   Osteoporosis 11/20/2007    REFERRING PROVIDER:  Enid Baas, MD    REFERRING DIAG:  [phone] (ICD-10-CM) - Right hip pain  M53.3 (ICD-10-CM) - Pain of right sacroiliac joint    Rationale for Evaluation and Treatment: Rehabilitation  THERAPY DIAG:  Right hip pain  ONSET DATE: months ago started at lumbar spine and worked its way to toes   SUBJECTIVE:                                                                                                                                                                                           SUBJECTIVE STATEMENT:  Pt reports no complaints at entry. "I have to leave in 30 mins today for a doctor's appt."    PERTINENT HISTORY:  Osteoperosis, h/o cervical fx,  chronic SIJ  pain, poor balance  PAIN:  Are you having pain? 0/10  PRECAUTIONS:  Fall  WEIGHT BEARING RESTRICTIONS:  No  FALLS:  Has patient fallen in last 6 months? Yes. Number of falls 1  LIVING ENVIRONMENT: Lives with: lives alone   OCCUPATION:  retired  PLOF:  Independent  PATIENT GOALS:  Improve balance, decr leg heaviness   OBJECTIVE:   DIAGNOSTIC FINDINGS:  DG 02/01/23 Lumbar:  Grade 1 anterolisthesis of L4 versus L5. Grade 1 retrolisthesis of L1 versus L2 and L2 versus L3. No other malalignment. Multilevel degenerative disc disease with loss of disc height at multiple levels and vacuum disc phenomena at multiple levels. Severe lower lumbar facet degenerative changes are noted. Calcified atherosclerotic changes in the abdominal aorta and iliac vessels. Hip:. Visualized pelvic ring is intact. Degenerative changes of the right hip joint are noted. No acute fracture or dislocation is seen. No soft tissue abnormality is noted.   PATIENT SURVEYS:  FOTO 28 FOTO closed on 7/22  COGNITIVE STATUS: Within functional limits for tasks assessed  7/22: pt states that this is only her 2nd visit to PT and does not recall the other appointments when I advise her that today is visit 7.   RED FLAGS: None    SENSATION: WFL   GAIT: Assistive device utilized: Walker - 4 wheeled Level of assistance: Modified independence Comments: Rt trendelenburg  7/22: ambulating with SPC, no trunk mobility or rotation in gait, slow cadence, very minimal trendelenburg.   9/12: L foot dragging Decreased arm swing  Body Part #1 Lumbar   LE MMT LOWER EXTREMITY MMT:    MMT (lb) Right eval Left eval Rt/Lt 7/22 Rt/lt 9/3  Hip flexion 19.3 16.6  17.9/26.1  Hip extension      Hip abduction 5.1 11.5 7.2/13.0 29.4/25.6  Hip adduction      Hip internal rotation      Hip external rotation      Knee flexion 16.7 13.3 16.2/16.2 22.4/20.2  Knee extension 17.2 12.3 24.0/21.7 25.6/23.7  Ankle  dorsiflexion      Ankle plantarflexion      Ankle inversion      Ankle eversion       (Blank rows = not tested)   BERG: 05/16/23 (see flowsheet for full test): 40/56 7/22: 44/56 (see flowsheet for full test)  TREATMENT:                                                                                                                                DATE: 9/12 Gait training without AD- light CGA with gait belt- 224ft -Walking marching at rail -Hip hikes 1x10L, 2x10R from 2" step -Resisted side stepping RTB at ankles x2 laps at rail (cues for increased step length and pelvic orientation) -Stairs- full flight reciprocal- single rail light CGA -HEP consolidation/review   DATE: 9/5 -Nu-step L4 UE and Les -BERG blaance test-53/56 Gait training without AD- light CGA  with gait belt- 273ft   DATE: 9/3 -Nu-step L4 UE and Les Goal review Updated strength Heel raises 2x10 Stair climbing- full flight- reciprocal- single rail use (SBA) Gait training     DATE: 8/16 -Nu-step L4 UE and Les LAQ 3#- 3" hold 2x10bil Seated marching 3lbs 2x10ea  Heel raises 2x10 Side stepping at rail with RTB at ankles x 1lap Staggered sit to stands 2x10  Stair climbing- full flight- reciprocal- single rail use (SBA) Marching 1/2 hall, then retro walking 1/2 hall x2 (SBA/CGA with gait belt)   DATE: 7/29 -Nu-step L4 UE and Les -Manual stretching of R piriformis and R ITB -DKTC- 30sec x3 LTR- 5" x10ea  LAQ 3#- 3" hold 2x10R Seated marching 3lbs 2x20 Sidelying hip abduction 2x10 bil  Heel raises 2x10 Staggered sit to stands x10  Stair climbing- full flight- reciprocal- single rail use (SBA)     PATIENT EDUCATION:  Education details: Teacher, music of condition, POC, HEP, exercise form/rationale Person educated: Patient Education method: Explanation, Demonstration, Tactile cues, Verbal cues, and Handouts Education comprehension: verbalized understanding, returned demonstration,  verbal cues required, tactile cues required, and needs further education  HOME EXERCISE PROGRAM: [email or web address]   ASSESSMENT:  CLINICAL IMPRESSION: Pt demonstrates trendelenburg with gait while not using AD, resulting in L foot dragging. Provided pt condensed HEP with abduction strengthening focus. Will plan to d/c at end of POC on 9/2 due to her excellent progress.    OBJECTIVE IMPAIRMENTS: decreased activity tolerance, decreased balance, difficulty walking, decreased ROM, decreased strength, increased muscle spasms, improper body mechanics, postural dysfunction, and pain.   ACTIVITY LIMITATIONS: carrying, lifting, bending, standing, squatting, stairs, and locomotion level  PARTICIPATION LIMITATIONS: shopping and community activity  PERSONAL FACTORS:  see PMH  are also affecting patient's functional outcome.   REHAB POTENTIAL: Good  CLINICAL DECISION MAKING: Evolving/moderate complexity  EVALUATION COMPLEXITY: Moderate   GOALS: Goals reviewed with patient? Yes  SHORT TERM GOALS: Target date: 06/04/23  Able to demo good contraction of hip abductors in CKC Baseline: Goal status: IN PROGRESS 9/3 2.  Independent with HEP as it has been established Baseline:  Goal status: MET 7/15    LONG TERM GOALS: Target date: POC date  Meet FOTO goal Baseline:  Goal status: deferred  2.  Resolution of trendelenburg  Baseline:  Goal status: ongoing (minimal trendelenburg 9/3)  3.  BERG to improve by MDC Baseline:  Goal status:MET 9/5  4.  Navigate stairs step over step and with minimal to no discomfort Baseline:  Goal status:MET (9/3)    PLAN:  PT FREQUENCY: 1-2x/week  PT DURATION: 8 weeks  PLANNED INTERVENTIONS: Therapeutic exercises, Therapeutic activity, Neuromuscular re-education, Balance training, Gait training, Patient/Family education, Self Care, Joint mobilization, Stair training, Aquatic Therapy, Dry Needling, Electrical stimulation, Spinal mobilization, Cryotherapy,  Moist heat, Taping, Traction, Ultrasound, Ionotophoresis 4mg /ml Dexamethasone, Manual therapy, and Re-evaluation.  PLAN FOR NEXT SESSION: continue to progress lumbopelvic strength  Riki Altes, PTA  08/11/23 1:46 PM

## 2023-08-12 ENCOUNTER — Other Ambulatory Visit: Payer: Self-pay | Admitting: Internal Medicine

## 2023-08-12 DIAGNOSIS — H01111 Allergic dermatitis of right upper eyelid: Secondary | ICD-10-CM | POA: Diagnosis not present

## 2023-08-12 DIAGNOSIS — H01114 Allergic dermatitis of left upper eyelid: Secondary | ICD-10-CM | POA: Diagnosis not present

## 2023-08-14 NOTE — Progress Notes (Signed)
Assessment/Plan:   Dementia likely due to Alzheimer's Disease, late onset without behavioral disturbance  [name] is a very pleasant 87 y.o. RH female with a history of hypertension, CHF, h/o posterior L frontal meningioma (16 x 11 by 17 mmm on 03/2021 MRI), hyperlipidemia, situational depression, anxiety, hypothyroidism, GERD, IBS, OA, seen today in follow up for memory loss. Patient is currently on memantine 5 mg twice daily (unable to tolerate donepezil).  A neuropsychological evaluation is planned for October 2024 for clarity of the diagnosis.  In the interim, the patient reports a slight worsening of memory.  In addition, she is still dealing with situational depression after the death of her husband last year.  She is about to start psychotherapy which is very likely to be helpful to her.  She tries to remain active, probably and is participating in many activities as per independent living facility.  Recommendations    Follow up in 6  months. Continue B12 supplements Increase memantine to 10 mg twice daily for better coverage, side effects discussed Patient has an appointment for neuropsychological testing in October 2024, for clarity of the diagnosis and trajectory of the disease. Recommend good control of her cardiovascular risk factors Continue to control mood as per PCP.  She is starting psychology visits for situational depression and grief Repeat MRI of the brain for evaluation of meningioma as per protocol.    Subjective:    This patient is here alone.  Previous records as well as any outside records available were reviewed prior to todays visit. Patient was last seen on 02/09/2023, last MoCA 11/08/2022 of 22/30.   Any changes in memory since last visit? " A little worse, takes me time to remember but usually pops in"-she says. Patient has some difficulty remembering recent conversations and people names. They are starting a memory class and I signed in for it".   repeats oneself?  Endorsed.  Today's visit, she told me that her husband died, an event that took place last year and that it was already known to [place]. Disoriented when walking into a room?  Patient denies    Leaving objects in unusual places?  May misplace things but not in unusual places   Wandering behavior?  denies   Any personality changes since last visit?  denies   Any worsening depression?:  Dealing with the loss of her husband.  She is about to start psychotherapy regarding this. Hallucinations or paranoia?  Denies.   Seizures? denies    Any sleep changes?  Sleeps well. Denies vivid dreams, REM behavior or sleepwalking   Sleep apnea?   Denies.   Any hygiene concerns? Denies.  Independent of bathing and dressing?  Endorsed  Does the patient needs help with medications? Patient  is in charge   Who is in charge of the finances?  Patient  is in charge, the sons help with the additional bills and paperwork.     Any changes in appetite?  Denies.     Patient have trouble swallowing? Denies.   Does the patient cook? No Any headaches?   denies   Chronic back pain she has issues with sciatica, and is followed by neurosurgery. Ambulates with difficulty?  She does physical therapy for sciatica, gets shots by Dr. Lorrine Kin, "and that helps ". Recent falls or head injuries? Denies.     Unilateral weakness, numbness or tingling? denies   Any tremors?  Denies   Any anosmia?  Denies   Any incontinence of urine?  Endorsed, especially at night. Any bowel dysfunction?  She has chronic constipation and internal hemorrhoids followed by GI. Patient lives at [facility]  Does the patient drive? No longer drives. Has a driver at [facility]    MRI of the brain personally reviewed remarkable for a known calcified meningioma on the posterior left frontal lobe measuring 1.7 x 1.6 cm and change since prior MRI in May 2022.  No parenchymal edema was noted.  There is mild chronic small vessel ischemic  changes within the cerebral white matter, slightly progressed from prior MRI, as well as mild to moderate generalized cerebral atrophy, which is stable.   Initial evaluation 11/08/2022 How long did patient have memory difficulties? [name] , her daughter told her that for the last 6 months she was "forgetting things 10 minutes later" so now she writes what she needs to remember in a pad. However, she never forgets meds or appt's, but s he may experience some difficulties with recent conversation someone's names. For a few weeks she was not active, trying to adjust to her loss, but recently" I just started to read again, watch Netflix."    repeats oneself? Denies  Disoriented when walking into a room?  Patient denies   Leaving objects in unusual places? " I am always leaving my phone everywhere" but not in unusual places Ambulates  with difficulty?   She has neuropathy and radiculopathy, so she is "a little wobbly", she was on gabapentin, now on Lyrica. She is doing PT and balance therapy "and it helps"  Recent falls? Recently she had a mechanical fall after tripping over an object and sustaining a head injury with 1 inch laceration above the L eye and some skin tear on the R knee, no LOC, "just a little concussion".  History of seizures?   Patient denies   Wandering behavior?  Patient denies   Patient drives?   Patient no longer drives Any personality changes?  She reports more anxiety, I know he (her husband [name])  is not here, but I talk to him good morning because it is comforting " Any history of depression?: She is experiencing grief depression as her husband of 11 years died on 09-12-2023. She is starting therapy [employer] libr[employer]) and will start grief group at [facility] this week.  Hallucinations or paranoia?  Patient denies   She has mild insomnia after her husband death. She falls asleep with the TV on. Denies vivid dreams except one time she dreamt about [name] "it was nice", denies REM behavior or  sleepwalking . History of sleep apnea?  Patient denies   Any hygiene concerns?  Patient denies   Independent of bathing and dressing?  Endorsed  Does the patient needs help with medications? Patient is in charge  Who is in charge of the finances?  Patient is in charge   Any changes in appetite?  Patient denies   Patient have trouble swallowing? Patient denies, I live in [facility], I don't have to Does the patient cook? Any kitchen accidents such as leaving the stove on?  Patient denies   Any headaches?  Patient denies   Double vision? Patient denies   Any focal numbness or tingling? She has chronic paresthesias.  Chronic pain?  She has some R  buttock, RLE pain due to radiculopathy, on LS injections by NS, gabapentin and tramadol prn..  She has a history of C spine fusion as well.  Unilateral weakness?  Patient denies   Any tremors?  Patient denies   Any history of anosmia?  Patient denies   Any incontinence of urine?  She reports nocturia  Any bowel dysfunction?   Patient denies    History of heavy alcohol intake?  Patient denies   History of heavy tobacco use?  Patient denies   Family history of dementia?   Negative for  Alzheimer's disease   Patient lives:  I. L at [facility], her 2 sons live in [place]   Pertinent studies 2 D echo  EF 60-65, mild AI, no sign AS B12 229 (December 2023)  Personally reviewed MRI of the brain without contrast, remarkable for mild to moderate generalized cerebral atrophy, chronic small vessel ischemic disease, redemonstrated punctate chronic microhemorrhage within the right frontoparietal lobe white matter, and a 1.7 x 1.6 x 1.1 PREVIOUS MEDICATIONS:   CURRENT MEDICATIONS:  Outpatient Encounter Medications as of 08/15/2023  Medication Sig   acetaminophen (TYLENOL) 500 MG tablet Take 1,000 mg by mouth every 6 (six) hours as needed for mild pain.   Azelastine HCl 0.15 % SOLN Place into both nostrils. Place into both nostrils.   Calcium Carbonate-Vitamin  D (CALCIUM CARBONATE W/VITAMIN D PO) Take 1 tablet by mouth daily.   cefUROXime (CEFTIN) 250 MG tablet Take 1 tablet (250 mg total) by mouth 2 (two) times daily with a meal.   desonide (DESOWEN) 0.05 % cream Apply 1 application topically as needed.   diclofenac Sodium (VOLTAREN) 1 % GEL APPLY 4 GRAMS 4 TIMES DAILY.   EPINEPHrine (EPIPEN 2-PAK) 0.3 mg/0.3 mL IJ SOAJ injection Inject 0.3 mLs (0.3 mg total) into the muscle as needed.   erythromycin ophthalmic ointment Place 1 Application into both eyes at bedtime.   famotidine (PEPCID) 40 MG tablet Take 1 tablet (40 mg total) by mouth at bedtime. NEEDS OFFICE VISIT FOR ADDITIONAL REFILLS   Gauze Pads & Dressings (TELFA ADHESIVE DRESSING) 3"X4" PADS Use qd   hydrocortisone (ANUSOL-HC) 2.5 % rectal cream Place 1 Application rectally 2 (two) times daily. Insert per rectum   ipratropium (ATROVENT) 0.06 % nasal spray Place 1 spray into the nose 3 (three) times daily as needed for rhinitis.   levocetirizine (XYZAL) 5 MG tablet Take 5 mg by mouth every evening.   levothyroxine (SYNTHROID) 50 MCG tablet TAKE ONE TABLET BY MOUTH DAILY BEFORE BREAKFAST   loratadine (CLARITIN) 10 MG tablet Take 1 tablet (10 mg total) by mouth daily.   LORazepam (ATIVAN) 1 MG tablet Take 2-3 tablets at bedtime and 1-2 tablets in the daytime as needed.   montelukast (SINGULAIR) 10 MG tablet Take 1 tablet (10 mg total) by mouth at bedtime.   oxymetazoline (AFRIN 12 HOUR) 0.05 % nasal spray Place 1 spray into both nostrils daily at 2 am.   polyethylene glycol powder (GLYCOLAX/MIRALAX) 17 GM/SCOOP powder Take 17 g by mouth 2 (two) times daily as needed for moderate constipation.   predniSONE (DELTASONE) 20 MG tablet Take 1 tablet (20 mg total) by mouth 2 (two) times daily.   psyllium (METAMUCIL SMOOTH TEXTURE) 58.6 % powder 1 scoop daily   simvastatin (ZOCOR) 40 MG tablet TAKE ONE TABLET ONCE DAILY   solifenacin (VESICARE) 10 MG tablet Take 1 tablet (10 mg total) by mouth daily as  needed (bladder).   traMADol (ULTRAM) 50 MG tablet Take 1 tablet (50 mg total) by mouth every 6 (six) hours as needed for severe pain.   triamcinolone (NASACORT) 55 MCG/ACT AERO nasal inhaler Place 2 sprays into the nose as needed.   [DISCONTINUED]  memantine (NAMENDA) 5 MG tablet Take 5 mg by mouth 2 (two) times daily.   albuterol (VENTOLIN HFA) 108 (90 Base) MCG/ACT inhaler Take 2 puffs every 4-6 hours as needed (Patient not taking: Reported on 08/15/2023)   memantine (NAMENDA) 10 MG tablet Take 1 tablet (10 mg total) by mouth 2 (two) times daily.   No facility-administered encounter medications on file as of 08/15/2023.       02/10/2017    1:39 PM  MMSE - Mini Mental State Exam  Orientation to time 5  Orientation to Place 5  Registration 3  Attention/ Calculation 5  Recall 2  Language- name 2 objects 2  Language- repeat 1  Language- follow 3 step command 3  Language- read & follow direction 1  Write a sentence 1  Copy design 1  Total score 29      11/08/2022    7:00 PM  Montreal Cognitive Assessment   Visuospatial/ Executive (0/5) 1  Naming (0/3) 1  Attention: Read list of digits (0/2) 2  Attention: Read list of letters (0/1) 1  Attention: Serial 7 subtraction starting at 100 (0/3) 3  Language: Repeat phrase (0/2) 2  Language : Fluency (0/1) 1  Abstraction (0/2) 1  Delayed Recall (0/5) 3  Orientation (0/6) 6  Total 21  Adjusted Score (based on education) 22    Objective:     PHYSICAL EXAMINATION:    VITALS:   Vitals:   08/15/23 1410  BP: (!) 145/77  Pulse: 75  Resp: 18  SpO2: 98%  Weight: 115 lb (52.2 kg)  Height: 5' (1.524 m)    GEN:  The patient appears stated age and is in NAD. HEENT:  Normocephalic, atraumatic.   Neurological examination:  General: NAD, well-groomed, appears stated age. Orientation: The patient is alert. Oriented to person, place and date Cranial nerves: There is good facial symmetry.The speech is fluent and clear. No aphasia or  dysarthria. Fund of knowledge is appropriate. Recent and remote memory are impaired. Attention and concentration are reduced.  Able to name objects and repeat phrases.  Hearing is intact to conversational tone. Sensation: Sensation is intact to light touch throughout Motor: Strength is at least antigravity x4. DTR's 2/4 in UE/LE     Movement examination: Tone: There is normal tone in the UE/LE Abnormal movements:  no tremor.  No myoclonus.  No asterixis.   Coordination:  There is no decremation with RAM's. Normal finger to nose  Gait and Station: The patient has no difficulty arising out of a deep-seated chair without the use of the hands but needs a walker to ambulate for stability only.. The patient's stride length is good.  Gait is cautious and narrow.    Thank you for allowing [place] the opportunity to participate in the care of this nice patient. Please do not hesitate to contact us for any questions or concerns.   Total time spent on today's visit was 40 minutes dedicated to this patient today, preparing to see patient, examining the patient, ordering tests and/or medications and counseling the patient, documenting clinical information in the EHR or other health record, independently interpreting results and communicating results to the patient/family, discussing treatment and goals, answering patient's questions and coordinating care.  Cc:  Plotnikov, Georgina Quint, MD  Marlowe Kays 08/15/2023 5:12 PM

## 2023-08-15 ENCOUNTER — Ambulatory Visit (INDEPENDENT_AMBULATORY_CARE_PROVIDER_SITE_OTHER): Payer: Medicare Other | Admitting: Physician Assistant

## 2023-08-15 ENCOUNTER — Encounter: Payer: Self-pay | Admitting: Physician Assistant

## 2023-08-15 VITALS — BP 145/77 | HR 75 | Resp 18 | Ht 60.0 in | Wt 115.0 lb

## 2023-08-15 DIAGNOSIS — R413 Other amnesia: Secondary | ICD-10-CM

## 2023-08-15 DIAGNOSIS — D329 Benign neoplasm of meninges, unspecified: Secondary | ICD-10-CM | POA: Diagnosis not present

## 2023-08-15 MED ORDER — MEMANTINE HCL 10 MG PO TABS: 10.0000 mg | ORAL_TABLET | Freq: Two times a day (BID) | ORAL | 11 refills | Status: DC

## 2023-08-15 NOTE — Patient Instructions (Signed)
It was a pleasure to see you today at our office. You have memory impairment with cause being unknown but we are working  on it but the workup is in progress as listed below.   Recommendations:  Neurocognitive evaluation at our office  Increase memantine to 10 mg two times a day  MRI of brain in Jan or Feb for following  the meningioma Follow up in 6 months   Whom to call:  Memory  decline, memory medications: Call our office [phone]   For psychiatric meds, mood meds: Please have your primary care physician manage these medications.    For assessment of decision of mental capacity and competency:  Call Dr. Erick Blinks, geriatric psychiatrist at [phone]  For guidance in geriatric dementia issues please call [facility] [phone]     If you have any severe symptoms of a stroke, or other severe issues such as confusion,severe chills or fever, etc call 911 or go to the ER as you may need to be evaluated further         RECOMMENDATIONS FOR ALL PATIENTS WITH MEMORY PROBLEMS: 1. Continue to exercise (Recommend 30 minutes of walking everyday, or 3 hours every week) 2. Increase social interactions - continue going to [place] and enjoy social gatherings with friends and family 3. Eat healthy, avoid fried foods and eat more fruits and vegetables 4. Maintain adequate blood pressure, blood sugar, and blood cholesterol level. Reducing the risk of stroke and cardiovascular disease also helps promoting better memory. 5. Avoid stressful situations. Live a simple life and avoid aggravations. Organize your time and prepare for the next day in anticipation. 6. Sleep well, avoid any interruptions of sleep and avoid any distractions in the bedroom that may interfere with adequate sleep quality 7. Avoid sugar, avoid sweets as there is a strong link between excessive sugar intake, diabetes, and cognitive impairment We discussed the Mediterranean diet, which has been shown to  help patients reduce the risk of progressive memory disorders and reduces cardiovascular risk. This includes eating fish, eat fruits and green leafy vegetables, nuts like almonds and hazelnuts, walnuts, and also use olive oil. Avoid fast foods and fried foods as much as possible. Avoid sweets and sugar as sugar use has been linked to worsening of memory function.  There is always a concern of gradual progression of memory problems. If this is the case, then we may need to adjust level of care according to patient needs. Support, both to the patient and caregiver, should then be put into place.      You have been referred for a neuropsychological evaluation (i.e., evaluation of memory and thinking abilities). Please bring someone with you to this appointment if possible, as it is helpful for the doctor to hear from both you and another adult who knows you well. Please bring eyeglasses and hearing aids if you wear them.    The evaluation will take approximately 3 hours and has two parts:   The first part is a clinical interview with the neuropsychologist (Dr. Milbert Coulter or Dr. Roseanne Reno). During the interview, the neuropsychologist will speak with you and the individual you brought to the appointment.    The second part of the evaluation is testing with the doctor's technician Annabelle Harman or Selena Batten). During the testing, the technician will ask you to remember different types of material, solve problems, and answer some questionnaires. Your family member will not be present for this portion of the evaluation.   Please note: We must  reserve several hours of the neuropsychologist's time and the psychometrician's time for your evaluation appointment. As such, there is a No-Show fee of $100. If you are unable to attend any of your appointments, please contact our office as soon as possible to reschedule.    FALL PRECAUTIONS: Be cautious when walking. Scan the area for obstacles that may increase the risk of trips and  falls. When getting up in the mornings, sit up at the edge of the bed for a few minutes before getting out of bed. Consider elevating the bed at the head end to avoid drop of blood pressure when getting up. Walk always in a well-lit room (use night lights in the walls). Avoid area rugs or power cords from appliances in the middle of the walkways. Use a walker or a cane if necessary and consider physical therapy for balance exercise. Get your eyesight checked regularly.  FINANCIAL OVERSIGHT: Supervision, especially oversight when making financial decisions or transactions is also recommended.  HOME SAFETY: Consider the safety of the kitchen when operating appliances like stoves, microwave oven, and blender. Consider having supervision and share cooking responsibilities until no longer able to participate in those. Accidents with firearms and other hazards in the house should be identified and addressed as well.   ABILITY TO BE LEFT ALONE: If patient is unable to contact 911 operator, consider using [facility], or when the need is there, arrange for someone to stay with patients. Smoking is a fire hazard, consider supervision or cessation. Risk of wandering should be assessed by caregiver and if detected at any point, supervision and safe proof recommendations should be instituted.  MEDICATION SUPERVISION: Inability to self-administer medication needs to be constantly addressed. Implement a mechanism to ensure safe administration of the medications.   DRIVING: Regarding driving, in patients with progressive memory problems, driving will be impaired. We advise to have someone else do the driving if trouble finding directions or if minor accidents are reported. Independent driving assessment is available to determine safety of driving.   If you are interested in the driving assessment, you can contact the following:  [facility] [employer] [facility] in [place] [phone]  [facility]  [phone]  [facility] [phone] or [phone]    Mediterranean Diet A Mediterranean diet refers to food and lifestyle choices that are based on the traditions of countries located on the [employer]. This way of eating has been shown to help prevent certain conditions and improve outcomes for people who have chronic diseases, like kidney disease and heart disease. What are tips for following this plan? Lifestyle  Cook and eat meals together with your family, when possible. Drink enough fluid to keep your urine clear or pale yellow. Be physically active every day. This includes: Aerobic exercise like running or swimming. Leisure activities like gardening, walking, or housework. Get 7-8 hours of sleep each night. If recommended by your health care provider, drink red wine in moderation. This means 1 glass a day for nonpregnant women and 2 glasses a day for men. A glass of wine equals 5 oz (150 mL). Reading food labels  Check the serving size of packaged foods. For foods such as rice and pasta, the serving size refers to the amount of cooked product, not dry. Check the total fat in packaged foods. Avoid foods that have saturated fat or trans fats. Check the ingredients list for added sugars, such as corn syrup. Shopping  At the grocery store, buy most of  your food from the areas near the walls of the store. This includes: Fresh fruits and vegetables (produce). Grains, beans, nuts, and seeds. Some of these may be available in unpackaged forms or large amounts (in bulk). Fresh seafood. Poultry and eggs. Low-fat dairy products. Buy whole ingredients instead of prepackaged foods. Buy fresh fruits and vegetables in-season from local farmers markets. Buy frozen fruits and vegetables in resealable bags. If you do not have access to quality fresh seafood, buy precooked frozen shrimp or canned fish, such as tuna, salmon, or sardines. Buy  small amounts of raw or cooked vegetables, salads, or olives from the deli or salad bar at your store. Stock your pantry so you always have certain foods on hand, such as olive oil, canned tuna, canned tomatoes, rice, pasta, and beans. Cooking  Cook foods with extra-virgin olive oil instead of using butter or other vegetable oils. Have meat as a side dish, and have vegetables or grains as your main dish. This means having meat in small portions or adding small amounts of meat to foods like pasta or stew. Use beans or vegetables instead of meat in common dishes like chili or lasagna. Experiment with different cooking methods. Try roasting or broiling vegetables instead of steaming or sauteing them. Add frozen vegetables to soups, stews, pasta, or rice. Add nuts or seeds for added healthy fat at each meal. You can add these to yogurt, salads, or vegetable dishes. Marinate fish or vegetables using olive oil, lemon juice, garlic, and fresh herbs. Meal planning  Plan to eat 1 vegetarian meal one day each week. Try to work up to 2 vegetarian meals, if possible. Eat seafood 2 or more times a week. Have healthy snacks readily available, such as: Vegetable sticks with hummus. Greek yogurt. Fruit and nut trail mix. Eat balanced meals throughout the week. This includes: Fruit: 2-3 servings a day Vegetables: 4-5 servings a day Low-fat dairy: 2 servings a day Fish, poultry, or lean meat: 1 serving a day Beans and legumes: 2 or more servings a week Nuts and seeds: 1-2 servings a day Whole grains: 6-8 servings a day Extra-virgin olive oil: 3-4 servings a day Limit red meat and sweets to only a few servings a month What are my food choices? Mediterranean diet Recommended Grains: Whole-grain pasta. Brown rice. Bulgar wheat. Polenta. Couscous. Whole-wheat bread. Orpah Cobb. Vegetables: Artichokes. Beets. Broccoli. Cabbage. Carrots. Eggplant. Green beans. Chard. Kale. Spinach. Onions. Leeks. Peas.  Squash. Tomatoes. Peppers. Radishes. Fruits: Apples. Apricots. Avocado. Berries. Bananas. Cherries. Dates. Figs. Grapes. Lemons. Melon. Oranges. Peaches. Plums. Pomegranate. Meats and other protein foods: Beans. Almonds. Sunflower seeds. Pine nuts. Peanuts. Cod. Salmon. Scallops. Shrimp. Tuna. Tilapia. Clams. Oysters. Eggs. Dairy: Low-fat milk. Cheese. Greek yogurt. Beverages: Water. Red wine. Herbal tea. Fats and oils: Extra virgin olive oil. Avocado oil. Grape seed oil. Sweets and desserts: [place] yogurt with honey. Baked apples. Poached pears. Trail mix. Seasoning and other foods: Basil. Cilantro. Coriander. Cumin. Mint. Parsley. Sage. Rosemary. Tarragon. Garlic. Oregano. Thyme. Pepper. Balsalmic vinegar. Tahini. Hummus. Tomato sauce. Olives. Mushrooms. Limit these Grains: Prepackaged pasta or rice dishes. Prepackaged cereal with added sugar. Vegetables: Deep fried potatoes (french fries). Fruits: Fruit canned in syrup. Meats and other protein foods: Beef. Pork. Lamb. Poultry with skin. Hot dogs. Tomasa Blase. Dairy: Ice cream. Sour cream. Whole milk. Beverages: Juice. Sugar-sweetened soft drinks. Beer. Liquor and spirits. Fats and oils: Butter. Canola oil. Vegetable oil. Beef fat (tallow). Lard. Sweets and desserts: Cookies. Cakes. Pies. Candy. Seasoning and other foods:  Mayonnaise. Premade sauces and marinades. The items listed may not be a complete list. Talk with your dietitian about what dietary choices are right for you. Summary The Mediterranean diet includes both food and lifestyle choices. Eat a variety of fresh fruits and vegetables, beans, nuts, seeds, and whole grains. Limit the amount of red meat and sweets that you eat. Talk with your health care provider about whether it is safe for you to drink red wine in moderation. This means 1 glass a day for nonpregnant women and 2 glasses a day for men. A glass of wine equals 5 oz (150 mL). This information is not intended to replace advice  given to you by your health care provider. Make sure you discuss any questions you have with your health care provider. Document Released: 07/08/2016 Document Revised: 09/12/2 Your provider has requested that you have labwork completed today. Please go to [facility] ([id number]) on the second floor of this building before leaving the office today. You do not need to check in. If you are not called within 15 minutes please check with the front desk.  Your Mri will be At [facility]

## 2023-08-16 ENCOUNTER — Ambulatory Visit (HOSPITAL_BASED_OUTPATIENT_CLINIC_OR_DEPARTMENT_OTHER): Payer: Medicare Other

## 2023-08-16 NOTE — Progress Notes (Signed)
treated

## 2023-08-17 ENCOUNTER — Ambulatory Visit (INDEPENDENT_AMBULATORY_CARE_PROVIDER_SITE_OTHER): Payer: Medicare Other | Admitting: Podiatry

## 2023-08-17 ENCOUNTER — Encounter: Payer: Self-pay | Admitting: Podiatry

## 2023-08-17 DIAGNOSIS — M2042 Other hammer toe(s) (acquired), left foot: Secondary | ICD-10-CM

## 2023-08-17 DIAGNOSIS — M7752 Other enthesopathy of left foot: Secondary | ICD-10-CM

## 2023-08-17 DIAGNOSIS — H00034 Abscess of left upper eyelid: Secondary | ICD-10-CM | POA: Diagnosis not present

## 2023-08-17 MED ORDER — TRIAMCINOLONE ACETONIDE 10 MG/ML IJ SUSP
10.0000 mg | Freq: Once | INTRAMUSCULAR | Status: AC
  Administered 2023-08-17: 10 mg via INTRA_ARTICULAR

## 2023-08-17 NOTE — Progress Notes (Signed)
Subjective:   Patient ID: [name], female   DOB: 87 y.o.   MRN: [id number]   HPI Patient presents with a lot of inflammation of the second digit left foot fluid buildup around the joint with rigid contracture of the digit also noted   ROS      Objective:  Physical Exam  Inflammatory capsulitis inner phalangeal joint fluid buildup digit to left with pain lesion formation     Assessment:  Capsulitis of the second digit left foot proximal to phalangeal joint     Plan:  H&P sterile prep injected the inner phalangeal joint 2 mg dexamethasone Kenalog debrided the lesion applied cushioning reappoint as needed may require digital amputation if symptoms get severe

## 2023-08-18 ENCOUNTER — Encounter (HOSPITAL_BASED_OUTPATIENT_CLINIC_OR_DEPARTMENT_OTHER): Payer: Self-pay

## 2023-08-18 ENCOUNTER — Ambulatory Visit (HOSPITAL_BASED_OUTPATIENT_CLINIC_OR_DEPARTMENT_OTHER): Payer: Medicare Other

## 2023-08-18 DIAGNOSIS — M25551 Pain in right hip: Secondary | ICD-10-CM

## 2023-08-18 NOTE — Therapy (Addendum)
OUTPATIENT PHYSICAL THERAPY TREATMENT/DISCHARGE  PHYSICAL THERAPY DISCHARGE SUMMARY  Visits from Start of Care: 13  Current functional level related to goals / functional outcomes: Indep using AD   Remaining deficits: pain   Education / Equipment: Management of condition; HEP   Patient agrees to discharge. Patient goals were partially met. Patient is being discharged due to being pleased with the current functional level.  Addend Corrie Dandy Tomma Lightning) Ziemba MPT 01/19/24 1:08 PM [facility] [address] [place], [place], [address] Phone: [phone]   Fax:  [phone]     Patient Name: [name] MRN: [id number] DOB:06-11-1936, 87 y.o., female Today's Date: 08/18/2023  END OF SESSION:  PT End of Session - 08/18/23 1308     Visit Number 13    Number of Visits 23    Date for PT Re-Evaluation 08/20/23    PT Start Time 1303    PT Stop Time 1340    PT Time Calculation (min) 37 min    Activity Tolerance Treatment limited secondary to medical complications (Comment)   Recent foot procedure   Behavior During Therapy WFL for tasks assessed/performed                       Past Medical History:  Diagnosis Date   Allergic rhinitis    Asthma    Atrophic vaginitis    Baker's cyst    Left-Dr. Aluisio   Cataract    Dr. Dione Booze   CHF (congestive heart failure) (HCC)    Colon polyps    Fibroid    Gastritis    chronic   GERD (gastroesophageal reflux disease)    Heart murmur    Hemorrhoids    Hyperlipidemia    Hypothyroidism    IBS (irritable bowel syndrome)    Lumbar spondylosis    MVP (mitral valve prolapse)    Antibiotics required for dental procedures   OA (osteoarthritis)    Osteoporosis 06/2018   T score -2.2 stable on Prolia   Overactive bladder    Sacroiliitis (HCC)    Thyroid disease    Thyroid nodule    Torn meniscus    bilateral   Past Surgical History:  Procedure Laterality Date    CATARACT EXTRACTION, BILATERAL     EYE SURGERY     POSTERIOR CERVICAL FUSION/FORAMINOTOMY N/A 05/15/2021   Procedure: Cervical One Laminectomy with resection of cyst, Fixation from Occiput to Cervical Four;  Surgeon: [name], MD;  Location: [facility] OR;  Service: Neurosurgery;  Laterality: N/A;   TONSILLECTOMY     Patient Active Problem List   Diagnosis Date Noted   Hypotension 07/13/2023   Skin ulcer of nose (HCC) 07/13/2023   Chalazion left upper eyelid 04/18/2023   Bruising 04/06/2023   Leg wound, right, initial encounter 01/17/2023   Pain of right sacroiliac joint 01/14/2023   Lumbar degenerative disc disease 12/21/2022   Chronic left SI joint pain 11/30/2022   Inflammation of sacroiliac joint (HCC) 11/16/2022   Stool incontinence 11/16/2022   Memory impairment 11/08/2022   Grief 10/06/2022   History of basal cell carcinoma 09/29/2022   Open forehead wound 07/21/2022   Stress at home 07/21/2022   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   Gait disorder 02/28/2022   History of malignant neoplasm of skin 02/22/2022   Melanocytic nevi of trunk 02/22/2022   Seborrheic dermatitis 02/22/2022   Urticaria 02/22/2022   Irregular bowel habits 11/18/2021   Scalp laceration, sequela 11/02/2021   Concussion 10/29/2021  Scalp itch 08/19/2021   Allergic rhinitis due to animal (cat) (dog) hair and dander 08/07/2021   Allergic rhinitis due to pollen 08/07/2021   Mild intermittent asthma 08/07/2021   Closed fracture of first cervical vertebra (HCC) 08/07/2021   Closed fracture of second cervical vertebra (HCC) 08/07/2021   Cervical spine instability 05/15/2021   Closed 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 05/13/2021   Cervical spinal cord compression (HCC) 05/04/2021   Fall against object 03/25/2021   Hematoma of face, initial encounter 03/25/2021   Degenerative disc disease, cervical 03/11/2021   Spondylolisthesis, cervical region 03/11/2021   Trochanteric  bursitis of right hip 09/22/2020   Elevated blood-pressure reading, without diagnosis of hypertension 07/14/2020   Meningioma (HCC) 03/25/2020   Arthralgia 03/18/2020   RUQ pain 01/02/2020   Cholelithiasis 09/09/2019   Ear pain, bilateral 03/21/2019   Cervical spondylosis 01/05/2019   Neck pain 12/26/2018   Acute pain of left shoulder 12/26/2018   Anxiety 08/22/2018   Contact dermatitis and eczema 06/14/2018   Constipation 11/11/2016   Low back pain 11/10/2016   Leg abrasion 08/12/2016   Contusion of right knee 08/12/2016   Sinusitis, chronic 08/06/2016   Rash and nonspecific skin eruption 04/20/2016   Aortic valve sclerosis 03/12/2015   Contusion of left chest wall 01/09/2015   Fatigue 09/08/2014   Bladder pain 08/19/2014   Increased frequency of urination 08/19/2014   Urinary urgency 08/19/2014   Postherpetic neuralgia 10/02/2013   Impacted cerumen of right ear 07/16/2013   Headache 07/10/2013   Aortic stenosis 04/19/2013   Insomnia 01/24/2013   Cough 08/07/2012   Upper respiratory infection 08/03/2012   Pruritus of skin 03/11/2012   MVP (mitral valve prolapse)    Multiple thyroid nodules 10/04/2011   Herpes zoster 09/07/2011   Essential hypertension, benign 07/27/2011   Atrophic vaginitis    Well adult exam 03/15/2011   Hyperlipemia, mixed 03/15/2011   OAB (overactive bladder) 09/02/2010   Hypothyroidism 03/03/2010   CALF PAIN, LEFT 10/27/2009   Belching 07/21/2009   EAR PAIN 12/19/2008   Allergic rhinitis 12/19/2008   Irritable bowel syndrome 09/04/2008   PERSONAL HX COLONIC POLYPS 09/04/2008   GASTRITIS, CHRONIC 09/03/2008   DUODENITIS, WITHOUT HEMORRHAGE 09/03/2008   TIBIALIS TENDINITIS 03/20/2008   Osteoarthritis 02/25/2008   SWEATING 02/21/2008   LACTOSE INTOLERANCE 11/20/2007   Dyslipidemia 11/20/2007   GERD 11/20/2007   Osteoporosis 11/20/2007    REFERRING PROVIDER:  Enid Baas, MD    REFERRING DIAG:  [id number] (ICD-10-CM) - Right hip pain   M53.3 (ICD-10-CM) - Pain of right sacroiliac joint    Rationale for Evaluation and Treatment: Rehabilitation  THERAPY DIAG:  Right hip pain  ONSET DATE: months ago started at lumbar spine and worked its way to toes   SUBJECTIVE:  SUBJECTIVE STATEMENT:  Pt reports she had a procedure done on her R foot yesterday which hurts. Notes some R hip pain that started last night, likely due to change in weight bearing following recent procedure.   PERTINENT HISTORY:  Osteoperosis, h/o cervical fx, chronic SIJ pain, poor balance  PAIN:  Are you having pain? 0/10  PRECAUTIONS:  Fall  WEIGHT BEARING RESTRICTIONS:  No  FALLS:  Has patient fallen in last 6 months? Yes. Number of falls 1  LIVING ENVIRONMENT: Lives with: lives alone   OCCUPATION:  retired  PLOF:  Independent  PATIENT GOALS:  Improve balance, decr leg heaviness   OBJECTIVE:   DIAGNOSTIC FINDINGS:  DG 02/01/23 Lumbar:  Grade 1 anterolisthesis of L4 versus L5. Grade 1 retrolisthesis of L1 versus L2 and L2 versus L3. No other malalignment. Multilevel degenerative disc disease with loss of disc height at multiple levels and vacuum disc phenomena at multiple levels. Severe lower lumbar facet degenerative changes are noted. Calcified atherosclerotic changes in the abdominal aorta and iliac vessels. Hip:. Visualized pelvic ring is intact. Degenerative changes of the right hip joint are noted. No acute fracture or dislocation is seen. No soft tissue abnormality is noted.   PATIENT SURVEYS:  FOTO 80 FOTO closed on 7/22  COGNITIVE STATUS: Within functional limits for tasks assessed  7/22: pt states that this is only her 2nd visit to PT and does not recall the other appointments when I advise her that today is visit 7.   RED  FLAGS: None    SENSATION: WFL   GAIT: Assistive device utilized: Walker - 4 wheeled Level of assistance: Modified independence Comments: Rt trendelenburg  7/22: ambulating with SPC, no trunk mobility or rotation in gait, slow cadence, very minimal trendelenburg.   9/12: L foot dragging Decreased arm swing  Body Part #1 Lumbar   LE MMT LOWER EXTREMITY MMT:    MMT (lb) Right eval Left eval Rt/Lt 7/22 Rt/lt 9/3 Rt/Lt  Hip flexion 19.3 16.6  17.9/26.1 22.3/25.8  Hip extension       Hip abduction 5.1 11.5 7.2/13.0 29.4/25.6 22.1/23.0  Hip adduction       Hip internal rotation       Hip external rotation       Knee flexion 16.7 13.3 16.2/16.2 22.4/20.2 21.1/18.7  Knee extension 17.2 12.3 24.0/21.7 25.6/23.7 25.9/21.2  Ankle dorsiflexion       Ankle plantarflexion       Ankle inversion       Ankle eversion        (Blank rows = not tested)   BERG: 05/16/23 (see flowsheet for full test): 40/56 7/22: 44/56 (see flowsheet for full test)  TREATMENT:                                                                                                                               DATE: 9/12 Updated MMT Piriformis stretch 30sec x3ea SKTC 30sec x3ea -HEP final update/review  DATE: 9/12 Gait training without AD- light CGA with gait belt- 28ft -Walking marching at rail -Hip hikes 1x10L, 2x10R from 2" step -Resisted side stepping RTB at ankles x2 laps at rail (cues for increased step length and pelvic orientation) -Stairs- full flight reciprocal- single rail light CGA -HEP consolidation/review   DATE: 9/5 -Nu-step L4 UE and Les -BERG blaance test-53/56 Gait training without AD- light CGA with gait belt- 218ft   DATE: 9/3 -Nu-step L4 UE and Les Goal review Updated strength Heel raises 2x10 Stair climbing- full flight- reciprocal- single rail use (SBA) Gait training     DATE: 8/16 -Nu-step L4 UE and Les LAQ 3#- 3" hold 2x10bil Seated marching  3lbs 2x10ea  Heel raises 2x10 Side stepping at rail with RTB at ankles x 1lap Staggered sit to stands 2x10  Stair climbing- full flight- reciprocal- single rail use (SBA) Marching 1/2 hall, then retro walking 1/2 hall x2 (SBA/CGA with gait belt)   DATE: 7/29 -Nu-step L4 UE and Les -Manual stretching of R piriformis and R ITB -DKTC- 30sec x3 LTR- 5" x10ea  LAQ 3#- 3" hold 2x10R Seated marching 3lbs 2x20 Sidelying hip abduction 2x10 bil  Heel raises 2x10 Staggered sit to stands x10  Stair climbing- full flight- reciprocal- single rail use (SBA)     PATIENT EDUCATION:  Education details: Teacher, music of condition, POC, HEP, exercise form/rationale Person educated: Patient Education method: Explanation, Demonstration, Tactile cues, Verbal cues, and Handouts Education comprehension: verbalized understanding, returned demonstration, verbal cues required, tactile cues required, and needs further education  HOME EXERCISE PROGRAM: [email or web address]   ASSESSMENT:  CLINICAL IMPRESSION: Pt has attended 13 visits of PT and has Pt limited with WB tolerance today due to recent L foot procedure. Pt will be d/c from PT due to meeting goals and reaching MMI. See objective section for recent measurements.Spent time discussing exercise plan with pt for HEP and gym at [facility]. She also is thinking about joint [facility]. Pt sees MD in a few weeks. Instructed pt to continue with exercise plan regularly. Provided additional hip stretching for HEP to address recent flare up.    OBJECTIVE IMPAIRMENTS: decreased activity tolerance, decreased balance, difficulty walking, decreased ROM, decreased strength, increased muscle spasms, improper body mechanics, postural dysfunction, and pain.   ACTIVITY LIMITATIONS: carrying, lifting, bending, standing, squatting, stairs, and locomotion level  PARTICIPATION LIMITATIONS: shopping and community activity  PERSONAL FACTORS:  see PMH  are also affecting  patient's functional outcome.   REHAB POTENTIAL: Good  CLINICAL DECISION MAKING: Evolving/moderate complexity  EVALUATION COMPLEXITY: Moderate   GOALS: Goals reviewed with patient? Yes  SHORT TERM GOALS: Target date: 06/04/23  Able to demo good contraction of hip abductors in [facility] Baseline: Goal status: MET 9/19 2.  Independent with HEP as it has been established Baseline:  Goal status: MET 7/15    LONG TERM GOALS: Target date: POC date  Meet FOTO goal Baseline:  Goal status: deferred  2.  Resolution of trendelenburg  Baseline:  Goal status: ongoing (minimal trendelenburg 9/3)  3.  BERG to improve by MDC Baseline:  Goal status:MET 9/5  4.  Navigate stairs step over step and with minimal to no discomfort Baseline:  Goal status:MET (9/3)    PLAN:  PT FREQUENCY: 1-2x/week  PT DURATION: 8 weeks  PLANNED INTERVENTIONS: Therapeutic exercises, Therapeutic activity, Neuromuscular re-education, Balance training, Gait training, Patient/Family education, Self Care, Joint mobilization, Stair training, Aquatic Therapy, Dry Needling, Electrical stimulation, Spinal mobilization, Cryotherapy, Moist heat, Taping,  Traction, Ultrasound, Ionotophoresis 4mg /ml Dexamethasone, Manual therapy, and Re-evaluation.  PLAN FOR NEXT SESSION: Discharge to HEP.  Riki Altes, PTA  08/18/23 2:49 PM

## 2023-08-22 ENCOUNTER — Ambulatory Visit (HOSPITAL_BASED_OUTPATIENT_CLINIC_OR_DEPARTMENT_OTHER): Payer: Medicare Other

## 2023-08-23 ENCOUNTER — Other Ambulatory Visit: Payer: Self-pay | Admitting: Internal Medicine

## 2023-08-24 ENCOUNTER — Telehealth: Payer: Self-pay | Admitting: Internal Medicine

## 2023-08-24 ENCOUNTER — Ambulatory Visit (INDEPENDENT_AMBULATORY_CARE_PROVIDER_SITE_OTHER): Payer: Medicare Other | Admitting: Sports Medicine

## 2023-08-24 ENCOUNTER — Other Ambulatory Visit: Payer: Self-pay | Admitting: Physician Assistant

## 2023-08-24 VITALS — BP 134/82 | Ht 60.0 in | Wt 116.0 lb

## 2023-08-24 DIAGNOSIS — M5136 Other intervertebral disc degeneration, lumbar region: Secondary | ICD-10-CM

## 2023-08-24 MED ORDER — MEMANTINE HCL 10 MG PO TABS: 10.0000 mg | ORAL_TABLET | Freq: Two times a day (BID) | ORAL | 11 refills | Status: DC

## 2023-08-24 NOTE — Progress Notes (Signed)
Chief complaint low back pain  We sent this patient to [facility] for aqua therapy She was reluctant to do so but says since that time her back pain has diminished 90% She is walking around her apartment at [facility] without using her walker She does use a walker when she goes out on longer walks but says she is using it more for stability and safety Currently she is having no pain radiating into her buttocks or lower extremities  Physical exam Pleasant elderly female in no acute distress BP 134/82   Ht 5' (1.524 m)   Wt 116 lb (52.6 kg)   BMI 22.65 kg/m   Back motion shows that she can do 30 to 40 degrees of lumbar flexion with no pain She can do 10 degrees of extension with no pain No pain on lateral bending to either side or on rotation No palpable tenderness over the lumbar spine  Walking gait without the walker reveals that she seems pretty steady

## 2023-08-24 NOTE — Assessment & Plan Note (Signed)
She has had a significant improvement in symptoms by doing aqua therapy  We hope to keep her on an ongoing activity program including some of the exercise classes at [facility]  Continue to walk around her apartment without a walker but on longer walks she is probably safer to use either a cane or her walker  She will recheck with me as needed

## 2023-08-24 NOTE — Telephone Encounter (Signed)
Pt keep calling about getting appt for tomorrow. I have told pt numerous times she can see someone pt refuse.

## 2023-08-24 NOTE — Telephone Encounter (Signed)
FYI.Marland KitchenMarland KitchenMarland KitchenMarland KitchenDisregard last message Dr. Macario Golds had an opening for tomorrow so we got her scheduled at 10:20.

## 2023-08-25 ENCOUNTER — Encounter: Payer: Self-pay | Admitting: Internal Medicine

## 2023-08-25 ENCOUNTER — Ambulatory Visit: Payer: Medicare Other | Admitting: Internal Medicine

## 2023-08-25 ENCOUNTER — Encounter (HOSPITAL_BASED_OUTPATIENT_CLINIC_OR_DEPARTMENT_OTHER): Payer: Medicare Other

## 2023-08-25 VITALS — BP 132/80 | Ht 60.0 in | Wt 116.0 lb

## 2023-08-25 DIAGNOSIS — R350 Frequency of micturition: Secondary | ICD-10-CM

## 2023-08-25 DIAGNOSIS — N3281 Overactive bladder: Secondary | ICD-10-CM | POA: Diagnosis not present

## 2023-08-25 DIAGNOSIS — R413 Other amnesia: Secondary | ICD-10-CM

## 2023-08-25 DIAGNOSIS — E785 Hyperlipidemia, unspecified: Secondary | ICD-10-CM | POA: Diagnosis not present

## 2023-08-25 DIAGNOSIS — M26621 Arthralgia of right temporomandibular joint: Secondary | ICD-10-CM

## 2023-08-25 DIAGNOSIS — E034 Atrophy of thyroid (acquired): Secondary | ICD-10-CM | POA: Diagnosis not present

## 2023-08-25 DIAGNOSIS — M545 Low back pain, unspecified: Secondary | ICD-10-CM

## 2023-08-25 DIAGNOSIS — G8929 Other chronic pain: Secondary | ICD-10-CM

## 2023-08-25 LAB — POC URINALSYSI DIPSTICK (AUTOMATED)
Bilirubin, UA: NEGATIVE
Blood, UA: NEGATIVE
Glucose, UA: NEGATIVE
Ketones, UA: NEGATIVE
Leukocytes, UA: NEGATIVE
Nitrite, UA: NEGATIVE
Protein, UA: NEGATIVE
Spec Grav, UA: 1.015 (ref 1.010–1.025)
Urobilinogen, UA: 0.2 E.U./dL
pH, UA: 6 (ref 5.0–8.0)

## 2023-08-25 MED ORDER — VITAMIN B-12 1000 MCG PO TABS: 1000.0000 ug | ORAL_TABLET | Freq: Every day | ORAL | 3 refills | Status: AC

## 2023-08-25 NOTE — Assessment & Plan Note (Signed)
Check TSH 

## 2023-08-25 NOTE — Progress Notes (Signed)
Office Visit Note  Patient: [name]             Date of Birth: 03-Oct-1936           MRN: [id number]             PCP: [name], MD Referring: [name], MD Visit Date: 09/07/2023 Occupation: @GUAROCC @  Subjective:  Pain in neck and lower back.   History of Present Illness: [name] is a 87 y.o. female with to arthritis, degenerative disc disease and osteoporosis.  She states that she had an epidural injection about 4 months ago by Dr. Lorrine Kin which relieved her lower back pain.  She went for physical therapy [facility] which was very helpful.  She states she did really well for a while and now recently she has been having pain and discomfort in her neck, which radiates into her shoulders.  She also describes discomfort in her bilateral hips.  She continues to be on Prolia injections for osteoporosis by her GYN.  She takes calcium and vitamin D on a regular basis.  She also does exercises at [facility].    Activities of Daily Living:  Patient reports morning stiffness for 0  none .   Patient Denies nocturnal pain.  Difficulty dressing/grooming: Denies Difficulty climbing stairs: Denies Difficulty getting out of chair: Reports Difficulty using hands for taps, buttons, cutlery, and/or writing: Denies  Review of Systems  Constitutional:  Negative for fatigue.  HENT:  Negative for mouth sores and mouth dryness.   Eyes:  Negative for dryness.  Respiratory:  Negative for shortness of breath.   Cardiovascular:  Negative for chest pain and palpitations.  Gastrointestinal:  Negative for blood in stool, constipation and diarrhea.  Endocrine: Negative for increased urination.  Genitourinary:  Negative for involuntary urination.  Musculoskeletal:  Positive for joint pain and joint pain. Negative for gait problem, joint swelling, myalgias, muscle weakness, morning stiffness, muscle tenderness and myalgias.  Skin:  Positive for sensitivity to sunlight.  Negative for color change, rash and hair loss.  Allergic/Immunologic: Negative for susceptible to infections.  Neurological:  Negative for dizziness and headaches.  Hematological:  Negative for swollen glands.  Psychiatric/Behavioral:  Negative for depressed mood and sleep disturbance. The patient is not nervous/anxious.     PMFS History:  Patient Active Problem List   Diagnosis Date Noted   Hypotension 07/13/2023   Skin ulcer of nose (HCC) 07/13/2023   Chalazion left upper eyelid 04/18/2023   Bruising 04/06/2023   Leg wound, right, initial encounter 01/17/2023   Pain of right sacroiliac joint 01/14/2023   Lumbar degenerative disc disease 12/21/2022   Chronic left SI joint pain 11/30/2022   Inflammation of sacroiliac joint (HCC) 11/16/2022   Stool incontinence 11/16/2022   Memory impairment 11/08/2022   Grief 10/06/2022   History of basal cell carcinoma 09/29/2022   Open forehead wound 07/21/2022   Stress at home 07/21/2022   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   Gait disorder 02/28/2022   History of malignant neoplasm of skin 02/22/2022   Melanocytic nevi of trunk 02/22/2022   Seborrheic dermatitis 02/22/2022   Urticaria 02/22/2022   Irregular bowel habits 11/18/2021   Scalp laceration, sequela 11/02/2021   Concussion 10/29/2021   Scalp itch 08/19/2021   Allergic rhinitis due to animal (cat) (dog) hair and dander 08/07/2021   Allergic rhinitis due to pollen 08/07/2021   Mild intermittent asthma 08/07/2021   Closed fracture of first cervical vertebra (HCC) 08/07/2021   Closed  fracture of second cervical vertebra (HCC) 08/07/2021   Cervical spine instability 05/15/2021   Closed 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 05/13/2021   Cervical spinal cord compression (HCC) 05/04/2021   Fall against object 03/25/2021   Hematoma of face, initial encounter 03/25/2021   Degenerative disc disease, cervical 03/11/2021   Spondylolisthesis, cervical region  03/11/2021   Trochanteric bursitis of right hip 09/22/2020   Elevated blood-pressure reading, without diagnosis of hypertension 07/14/2020   Meningioma (HCC) 03/25/2020   Arthralgia 03/18/2020   RUQ pain 01/02/2020   Cholelithiasis 09/09/2019   Ear pain, bilateral 03/21/2019   Cervical spondylosis 01/05/2019   Neck pain 12/26/2018   Acute pain of left shoulder 12/26/2018   Anxiety 08/22/2018   Contact dermatitis and eczema 06/14/2018   Constipation 11/11/2016   Low back pain 11/10/2016   Leg abrasion 08/12/2016   Contusion of right knee 08/12/2016   Sinusitis, chronic 08/06/2016   Rash and nonspecific skin eruption 04/20/2016   Aortic valve sclerosis 03/12/2015   Contusion of left chest wall 01/09/2015   Fatigue 09/08/2014   Bladder pain 08/19/2014   Increased frequency of urination 08/19/2014   Urinary urgency 08/19/2014   Postherpetic neuralgia 10/02/2013   Impacted cerumen of right ear 07/16/2013   Headache 07/10/2013   Aortic stenosis 04/19/2013   Insomnia 01/24/2013   Cough 08/07/2012   Upper respiratory infection 08/03/2012   Pruritus of skin 03/11/2012   MVP (mitral valve prolapse)    Multiple thyroid nodules 10/04/2011   Herpes zoster 09/07/2011   Essential hypertension, benign 07/27/2011   Atrophic vaginitis    Well adult exam 03/15/2011   Hyperlipemia, mixed 03/15/2011   OAB (overactive bladder) 09/02/2010   Hypothyroidism 03/03/2010   CALF PAIN, LEFT 10/27/2009   Belching 07/21/2009   EAR PAIN 12/19/2008   Allergic rhinitis 12/19/2008   Irritable bowel syndrome 09/04/2008   History of colonic polyps 09/04/2008   GASTRITIS, CHRONIC 09/03/2008   DUODENITIS, WITHOUT HEMORRHAGE 09/03/2008   TIBIALIS TENDINITIS 03/20/2008   Osteoarthritis 02/25/2008   SWEATING 02/21/2008   LACTOSE INTOLERANCE 11/20/2007   Dyslipidemia 11/20/2007   GERD 11/20/2007   Osteoporosis 11/20/2007    Past Medical History:  Diagnosis Date   Allergic rhinitis    Asthma     Atrophic vaginitis    Baker's cyst    Left-Dr. Aluisio   Cataract    Dr. Dione Booze   CHF (congestive heart failure) (HCC)    Colon polyps    Fibroid    Gastritis    chronic   GERD (gastroesophageal reflux disease)    Heart murmur    Hemorrhoids    Hyperlipidemia    Hypothyroidism    IBS (irritable bowel syndrome)    Lumbar spondylosis    MVP (mitral valve prolapse)    Antibiotics required for dental procedures   OA (osteoarthritis)    Osteoporosis 06/2018   T score -2.2 stable on Prolia   Overactive bladder    Sacroiliitis (HCC)    Thyroid disease    Thyroid nodule    Torn meniscus    bilateral    Family History  Problem Relation Age of Onset   Heart disease Mother    Hypertension Mother    Osteoporosis Mother    Congestive Heart Failure Mother    Pancreatic cancer Father    Heart attack Brother    Drug abuse Brother        over dose    Healthy Son    Healthy Son  Colon cancer Neg Hx    Colon polyps Neg Hx    Rectal cancer Neg Hx    Stomach cancer Neg Hx    Past Surgical History:  Procedure Laterality Date   CATARACT EXTRACTION, BILATERAL     EYE SURGERY     POSTERIOR CERVICAL FUSION/FORAMINOTOMY N/A 05/15/2021   Procedure: Cervical One Laminectomy with resection of cyst, Fixation from Occiput to Cervical Four;  Surgeon: [name], MD;  Location: [facility] OR;  Service: Neurosurgery;  Laterality: N/A;   TONSILLECTOMY     Social History   Social History Narrative   Regular exercise-yesDaily caffeine use   Right handed   Lives alone   retired   Immunization History  Administered Date(s) Administered   Fluad Quad(high Dose 65+) 08/04/2019, 09/16/2021, 09/12/2022   Influenza Whole 09/29/2007, 08/21/2008, 09/02/2010, 07/30/2012   Influenza, High Dose Seasonal PF 09/03/2013, 02/19/2015, 11/12/2015, 08/06/2016, 11/11/2016, 08/20/2017, 08/31/2017, 09/20/2018, 09/12/2019   Influenza,inj,Quad PF,6+ Mos 12/17/2014, 07/30/2015   PFIZER(Purple Top)SARS-COV-2  Vaccination 12/12/2019, 12/31/2019, 07/29/2020, 07/29/2020, 07/31/2020, 09/10/2020   Pfizer Covid-19 Vaccine Bivalent Booster 9yrs & up 08/21/2021   Pfizer(Comirnaty)Fall Seasonal Vaccine 12 years and older 09/29/2022   Pneumococcal Conjugate-13 01/09/2014   Pneumococcal Polysaccharide-23 09/26/2006, 08/12/2015, 11/11/2016, 08/31/2017, 09/10/2020, 09/10/2021   Td 04/14/2010   Tdap 06/18/2020   Typhoid Inactivated 02/23/2013   Zoster, Live 12/09/2006     Objective: Vital Signs: BP 125/78 (BP Location: Right Arm, Patient Position: Sitting, Cuff Size: Normal)   Pulse 82   Resp 16   Ht 5' (1.524 m)   Wt 115 lb (52.2 kg)   BMI 22.46 kg/m    Physical Exam Vitals and nursing note reviewed.  Constitutional:      Appearance: She is well-developed.  HENT:     Head: Normocephalic and atraumatic.  Eyes:     Conjunctiva/sclera: Conjunctivae normal.  Cardiovascular:     Rate and Rhythm: Normal rate and regular rhythm.     Heart sounds: Normal heart sounds.  Pulmonary:     Effort: Pulmonary effort is normal.     Breath sounds: Normal breath sounds.  Abdominal:     General: Bowel sounds are normal.     Palpations: Abdomen is soft.  Musculoskeletal:     Cervical back: Normal range of motion.  Lymphadenopathy:     Cervical: No cervical adenopathy.  Skin:    General: Skin is warm and dry.     Capillary Refill: Capillary refill takes less than 2 seconds.  Neurological:     Mental Status: She is alert and oriented to person, place, and time.  Psychiatric:        Behavior: Behavior normal.      Musculoskeletal Exam: Very limited range of motion was noted in the cervical spine with lateral rotation, flexion and extension.  She had limited range of motion of the thoracic and lumbar spine with discomfort.  She had bilateral trapezius spasm.  She also had tenderness over bilateral piriformis region.  There was no tenderness over SI joints or lumbar spine.  Shoulders, elbows, wrist joints,  MCPs PIPs and DIPs with good range of motion.  Bilateral PIP and DIP thickening with no synovitis was noted.  Hip joints were in good range of motion.  There was no tenderness over trochanteric bursa.  Knee joints were in good range of motion without any warmth swelling or effusion.  There was no tenderness over ankles or MTPs.  Overcrowding of toes was noted.  CDAI Exam: CDAI Score: -- Patient Global: --;  Provider Global: -- Swollen: --; Tender: -- Joint Exam 09/07/2023   No joint exam has been documented for this visit   There is currently no information documented on the homunculus. Go to the Rheumatology activity and complete the homunculus joint exam.  Investigation: No additional findings.  Imaging: No results found.  Recent Labs: Lab Results  Component Value Date   WBC 7.1 07/12/2023   HGB 12.1 07/12/2023   PLT 356.0 07/12/2023   NA 133 (L) 07/12/2023   K 3.9 07/12/2023   CL 98 07/12/2023   CO2 28 07/12/2023   GLUCOSE 94 07/12/2023   BUN 10 07/12/2023   CREATININE 0.64 07/12/2023   BILITOT 0.5 07/12/2023   ALKPHOS 41 07/12/2023   AST 19 07/12/2023   ALT 15 07/12/2023   PROT 7.1 07/12/2023   ALBUMIN 4.1 07/12/2023   CALCIUM 9.2 07/12/2023   GFRAA 87 07/17/2020    Speciality Comments: No specialty comments available.  Procedures:  Trigger Point Inj  Date/Time: 09/07/2023 1:39 PM  Performed by: Pollyann Savoy, MD Authorized by: Pollyann Savoy, MD   Consent Given by:  Patient Site marked: the procedure site was marked   Timeout: prior to procedure the correct patient, procedure, and site was verified   Indications:  Muscle spasm and pain Total # of Trigger Points:  2 Location: neck   Needle Size:  27 G Approach:  Dorsal Medications #1:  0.5 mL lidocaine 1 %; 20 mg triamcinolone acetonide 40 MG/ML Medications #2:  0.5 mL lidocaine 1 %; 20 mg triamcinolone acetonide 40 MG/ML Patient tolerance:  Patient tolerated the procedure well with no immediate  complications  Allergies: Bee venom, Bacitracin-polymyxin b, Clarithromycin, Doxycycline, Neosporin [neomycin-bacitracin zn-polymyx], and Hydrocortisone   Assessment / Plan:     Visit Diagnoses: Primary osteoarthritis of both hands-she continues to have pain and stiffness in her bilateral hands.  Bilateral PIP DIP and CMC thickening was noted.  No synovitis was noted.  Primary osteoarthritis of both feet-overcrowding of toes was noted.  Proper fitting shoes were advised.  Spasm of both trapezius muscles-she has been having pain and stiffness in her cervical spine and had spasm in the bilateral trapezius region.  Patient has tried topical NSAIDs and Tylenol without much relief.  After side effects were discussed bilateral trapezius area were injected with lidocaine and Kenalog as described above.  Patient tolerated the procedure well.  Postprocedure instructions were given.  DDD (degenerative disc disease), cervical -she had limited range of motion of the cervical spine.  She had fusion in the past by Dr. Venetia Maxon.  She is followed by Dr. Jake Samples.  Piriformis syndrome of both sides-she has been having discomfort in bilateral piriformis region.  Stretching exercises were demonstrated in the office.  I advised her to come back if her symptoms persist for cortisone injection.  DDD (degenerative disc disease), lumbar -patient states she was having increased right sided radiculopathy which responded to the epidural injection done by Dr. Karmen Stabs.  Grade 1 anterolisthesis of L4 versus L5 and grade 1 retrolisthesis of L1 versus L2 and L2 versus L3.  She had multilevel spondylosis and facet joint arthropathy  Chronic SI joint pain-she had no SI joint tenderness on the examination today.  Age-related osteoporosis without current pathological fracture - she is on Prolia by her GYN.  Use of calcium rich diet and vitamin D was discussed.  Regular exercise was emphasized.  Further medical problems are listed as  follows:  Gait instability  History of hyperlipidemia  History of  IBS  History of gastroesophageal reflux (GERD)  Essential hypertension, benign-blood pressure was normal today.  Hypothyroidism due to acquired atrophy of thyroid  Grieving - patient lost her husband in October 2023.  There were married for 64 years.  She has been going through grieving process.  Orders: Orders Placed This Encounter  Procedures   Trigger Point Inj   No orders of the defined types were placed in this encounter.    Follow-Up Instructions: Return for Osteoarthritis.   Pollyann Savoy, MD  Note - This record has been created using Animal nutritionist.  Chart creation errors have been sought, but may not always  have been located. Such creation errors do not reflect on  the standard of medical care.

## 2023-08-25 NOTE — Assessment & Plan Note (Signed)
[name] finished PT

## 2023-08-25 NOTE — Assessment & Plan Note (Signed)
[facility] appt tomorrow

## 2023-08-25 NOTE — Assessment & Plan Note (Signed)
Cont on Simvastatin  ?

## 2023-08-25 NOTE — Progress Notes (Signed)
Subjective:  Patient ID: [name], female    DOB: March 19, 1936  Age: 87 y.o. MRN: [id number]  CC: Follow-up (Concerns about BP.[name] Pt also is having concerns about urinary frequency.)   HPI [name] presents for HTN. [name] was lightheaded w/SBP 128 the other day. [name] re-started Amlodipine 2 days ago - feeling better. [name] increased Memantine to 10 mg bid a month ago  Outpatient Medications Prior to Visit  Medication Sig Dispense Refill   acetaminophen (TYLENOL) 500 MG tablet Take 1,000 mg by mouth every 6 (six) hours as needed for mild pain.     albuterol (VENTOLIN HFA) 108 (90 Base) MCG/ACT inhaler Take 2 puffs every 4-6 hours as needed     amLODipine (NORVASC) 2.5 MG tablet Take 2.5 mg by mouth daily.     amoxicillin-clavulanate (AUGMENTIN) 500-125 MG tablet Take 1 tablet by mouth 2 (two) times daily.     Azelastine HCl 0.15 % SOLN Place into both nostrils. Place into both nostrils.     Calcium Carbonate-Vitamin D (CALCIUM CARBONATE W/VITAMIN D PO) Take 1 tablet by mouth daily.     desonide (DESOWEN) 0.05 % cream Apply 1 application topically as needed.     diclofenac Sodium (VOLTAREN) 1 % GEL APPLY 4 GRAMS 4 TIMES DAILY. 200 g 3   EPINEPHrine (EPIPEN 2-PAK) 0.3 mg/0.3 mL IJ SOAJ injection Inject 0.3 mLs (0.3 mg total) into the muscle as needed. 1 Device 1   erythromycin ophthalmic ointment Place 1 Application into both eyes at bedtime. 3.5 g 0   famotidine (PEPCID) 40 MG tablet Take 1 tablet (40 mg total) by mouth at bedtime. NEEDS OFFICE VISIT FOR ADDITIONAL REFILLS 30 tablet 2   Gauze Pads & Dressings (TELFA ADHESIVE DRESSING) 3"X4" PADS Use qd 20 each 0   hydrocortisone (ANUSOL-HC) 2.5 % rectal cream Place 1 Application rectally 2 (two) times daily. Insert per rectum 14 g 1   ipratropium (ATROVENT) 0.06 % nasal spray Place 1 spray into the nose 3 (three) times daily as needed for rhinitis. 15 mL 0   levocetirizine (XYZAL) 5 MG tablet Take 5 mg by mouth every evening.      levothyroxine (SYNTHROID) 50 MCG tablet TAKE ONE TABLET BY MOUTH DAILY BEFORE BREAKFAST 30 tablet 5   loratadine (CLARITIN) 10 MG tablet Take 1 tablet (10 mg total) by mouth daily. 100 tablet 3   LORazepam (ATIVAN) 1 MG tablet Take 2-3 tablets at bedtime and 1-2 tablets in the daytime as needed. 150 tablet 2   memantine (NAMENDA) 10 MG tablet Take 1 tablet (10 mg total) by mouth 2 (two) times daily. 60 tablet 11   montelukast (SINGULAIR) 10 MG tablet Take 1 tablet (10 mg total) by mouth at bedtime. 90 tablet 1   oxymetazoline (AFRIN 12 HOUR) 0.05 % nasal spray Place 1 spray into both nostrils daily at 2 am.     polyethylene glycol powder (GLYCOLAX/MIRALAX) 17 GM/SCOOP powder Take 17 g by mouth 2 (two) times daily as needed for moderate constipation. 500 g 3   predniSONE (DELTASONE) 20 MG tablet Take 1 tablet (20 mg total) by mouth 2 (two) times daily. 10 tablet 0   psyllium (METAMUCIL SMOOTH TEXTURE) 58.6 % powder 1 scoop daily 283 g 6   simvastatin (ZOCOR) 40 MG tablet TAKE ONE TABLET ONCE DAILY 90 tablet 3   solifenacin (VESICARE) 10 MG tablet Take 1 tablet (10 mg total) by mouth daily as needed (bladder). 90 tablet 3   traMADol (ULTRAM) 50  MG tablet Take 1 tablet (50 mg total) by mouth every 6 (six) hours as needed for severe pain. 120 tablet 2   triamcinolone (NASACORT) 55 MCG/ACT AERO nasal inhaler Place 2 sprays into the nose as needed.     cefUROXime (CEFTIN) 250 MG tablet Take 1 tablet (250 mg total) by mouth 2 (two) times daily with a meal. 14 tablet 0   No facility-administered medications prior to visit.    ROS: Review of Systems  Constitutional:  Negative for activity change, appetite change, chills, fatigue and unexpected weight change.  HENT:  Negative for congestion, mouth sores and sinus pressure.   Eyes:  Negative for visual disturbance.  Respiratory:  Negative for cough and chest tightness.   Gastrointestinal:  Negative for abdominal pain and nausea.  Genitourinary:   Negative for difficulty urinating, frequency and vaginal pain.  Musculoskeletal:  Positive for arthralgias. Negative for back pain and gait problem.  Skin:  Negative for pallor and rash.  Neurological:  Negative for dizziness, tremors, weakness, numbness and headaches.  Psychiatric/Behavioral:  Negative for confusion and sleep disturbance.     Objective:  BP 132/80 (BP Location: Left Arm, Patient Position: Sitting, Cuff Size: Normal)   Ht 5' (1.524 m)   Wt 116 lb (52.6 kg)   BMI 22.65 kg/m   BP Readings from Last 3 Encounters:  08/25/23 132/80  08/24/23 134/82  08/15/23 (!) 145/77    Wt Readings from Last 3 Encounters:  08/25/23 116 lb (52.6 kg)  08/24/23 116 lb (52.6 kg)  08/15/23 115 lb (52.2 kg)    Physical Exam Constitutional:      General: [name] is not in acute distress.    Appearance: Normal appearance. [name] is well-developed. [name] is not ill-appearing.  HENT:     Head: Normocephalic.     Right Ear: External ear normal.     Left Ear: External ear normal.     Nose: Nose normal.  Eyes:     General:        Right eye: No discharge.        Left eye: No discharge.     Conjunctiva/sclera: Conjunctivae normal.     Pupils: Pupils are equal, round, and reactive to light.  Neck:     Thyroid: No thyromegaly.     Vascular: No JVD.     Trachea: No tracheal deviation.  Cardiovascular:     Rate and Rhythm: Normal rate and regular rhythm.     Heart sounds: Normal heart sounds.  Pulmonary:     Effort: No respiratory distress.     Breath sounds: No stridor. No wheezing.  Abdominal:     General: Bowel sounds are normal. There is no distension.     Palpations: Abdomen is soft. There is no mass.     Tenderness: There is no abdominal tenderness. There is no guarding or rebound.  Musculoskeletal:        General: No tenderness.     Cervical back: Normal range of motion and neck supple. No rigidity.  Lymphadenopathy:     Cervical: No cervical adenopathy.  Skin:    Findings: No  erythema or rash.  Neurological:     Cranial Nerves: No cranial nerve deficit.     Motor: No abnormal muscle tone.     Coordination: Coordination normal.     Gait: Gait abnormal.     Deep Tendon Reflexes: Reflexes normal.  Psychiatric:        Behavior: Behavior normal.  Thought Content: Thought content normal.        Judgment: Judgment normal.    LS w/pain Using a walker  Lab Results  Component Value Date   WBC 7.1 07/12/2023   HGB 12.1 07/12/2023   HCT 36.6 07/12/2023   PLT 356.0 07/12/2023   GLUCOSE 94 07/12/2023   CHOL 164 04/01/2023   TRIG 114.0 04/01/2023   HDL 51.60 04/01/2023   LDLDIRECT 98.0 08/13/2021   LDLCALC 90 04/01/2023   ALT 15 07/12/2023   AST 19 07/12/2023   NA 133 (L) 07/12/2023   K 3.9 07/12/2023   CL 98 07/12/2023   CREATININE 0.64 07/12/2023   BUN 10 07/12/2023   CO2 28 07/12/2023   TSH 1.29 03/16/2023   HGBA1C 5.5 06/13/2020    ECHOCARDIOGRAM COMPLETE  Result Date: 02/11/2023    ECHOCARDIOGRAM REPORT   Patient Name:   SHAIANN HEINISCH Date of Exam: 02/11/2023 Medical Rec #:  [id number]     Height:       60.5 in Accession #:    [id number]    Weight:       116.0 lb Date of Birth:  02/20/1936      BSA:          1.490 m Patient Age:    86 years      BP:           133/76 mmHg Patient Gender: F             HR:           73 bpm. Exam Location:  Outpatient Procedure: 2D Echo, Color Doppler and Cardiac Doppler Indications:    I50.9* Heart failure (unspecified)  History:        Patient has prior history of Echocardiogram examinations, most                 recent 06/25/2022. Risk Factors:Dyslipidemia.  Sonographer:    Irving Burton Senior RDCS Referring Phys: [id number] DANIEL R BENSIMHON IMPRESSIONS  1. Left ventricular ejection fraction, by estimation, is 55 to 60%. The left ventricle has normal function. The left ventricle has no regional wall motion abnormalities. Left ventricular diastolic parameters are consistent with Grade I diastolic dysfunction (impaired relaxation).   2. Right ventricular systolic function is normal. The right ventricular size is normal. There is normal pulmonary artery systolic pressure.  3. The mitral valve is normal in structure. Mild mitral valve regurgitation. No evidence of mitral stenosis.  4. The aortic valve is tricuspid. There is mild calcification of the aortic valve. Aortic valve regurgitation is mild. Aortic valve sclerosis/calcification is present, without any evidence of aortic stenosis. Aortic regurgitation PHT measures 436 msec. Aortic valve area, by VTI measures 1.81 cm. Aortic valve mean gradient measures 5.0 mmHg. Aortic valve Vmax measures 1.45 m/s.  5. The inferior vena cava is normal in size with greater than 50% respiratory variability, suggesting right atrial pressure of 3 mmHg. FINDINGS  Left Ventricle: Left ventricular ejection fraction, by estimation, is 55 to 60%. The left ventricle has normal function. The left ventricle has no regional wall motion abnormalities. The left ventricular internal cavity size was normal in size. There is  no left ventricular hypertrophy. Left ventricular diastolic parameters are consistent with Grade I diastolic dysfunction (impaired relaxation). Right Ventricle: The right ventricular size is normal. No increase in right ventricular wall thickness. Right ventricular systolic function is normal. There is normal pulmonary artery systolic pressure. The tricuspid regurgitant velocity is 2.35 m/s, and  with an  assumed right atrial pressure of 3 mmHg, the estimated right ventricular systolic pressure is 25.1 mmHg. Left Atrium: Left atrial size was normal in size. Right Atrium: Right atrial size was normal in size. Pericardium: There is no evidence of pericardial effusion. Mitral Valve: The mitral valve is normal in structure. Mild mitral valve regurgitation. No evidence of mitral valve stenosis. Tricuspid Valve: The tricuspid valve is normal in structure. Tricuspid valve regurgitation is not demonstrated. No  evidence of tricuspid stenosis. Aortic Valve: The aortic valve is tricuspid. There is mild calcification of the aortic valve. Aortic valve regurgitation is mild. Aortic regurgitation PHT measures 436 msec. Aortic valve sclerosis/calcification is present, without any evidence of aortic stenosis. Aortic valve mean gradient measures 5.0 mmHg. Aortic valve peak gradient measures 8.4 mmHg. Aortic valve area, by VTI measures 1.81 cm. Pulmonic Valve: The pulmonic valve was normal in structure. Pulmonic valve regurgitation is trivial. No evidence of pulmonic stenosis. Aorta: The aortic root is normal in size and structure. Venous: The inferior vena cava is normal in size with greater than 50% respiratory variability, suggesting right atrial pressure of 3 mmHg. IAS/Shunts: No atrial level shunt detected by color flow Doppler.  LEFT VENTRICLE PLAX 2D LVIDd:         4.00 cm   Diastology LVIDs:         2.90 cm   LV e' medial:    5.44 cm/s LV PW:         0.80 cm   LV E/e' medial:  8.4 LV IVS:        0.70 cm   LV e' lateral:   4.90 cm/s LVOT diam:     1.95 cm   LV E/e' lateral: 9.3 LV SV:         51 LV SV Index:   34 LVOT Area:     2.99 cm  RIGHT VENTRICLE RV S prime:     13.50 cm/s TAPSE (M-mode): 1.6 cm LEFT ATRIUM             Index        RIGHT ATRIUM           Index LA diam:        2.60 cm 1.74 cm/m   RA Area:     13.40 cm LA Vol (A2C):   43.7 ml 29.33 ml/m  RA Volume:   30.70 ml  20.60 ml/m LA Vol (A4C):   25.6 ml 17.18 ml/m LA Biplane Vol: 33.1 ml 22.21 ml/m  AORTIC VALVE AV Area (Vmax):    1.78 cm AV Area (Vmean):   1.81 cm AV Area (VTI):     1.81 cm AV Vmax:           145.00 cm/s AV Vmean:          101.000 cm/s AV VTI:            0.282 m AV Peak Grad:      8.4 mmHg AV Mean Grad:      5.0 mmHg LVOT Vmax:         86.60 cm/s LVOT Vmean:        61.200 cm/s LVOT VTI:          0.171 m LVOT/AV VTI ratio: 0.61 AI PHT:            436 msec  AORTA Ao Root diam: 2.90 cm Ao Asc diam:  2.90 cm MITRAL VALVE  TRICUSPID VALVE MV Area (PHT): 3.48 cm       TR Peak grad:   22.1 mmHg MV Decel Time: 218 msec       TR Vmax:        235.00 cm/s MR Peak grad:    114.5 mmHg MR Mean grad:    64.0 mmHg    SHUNTS MR Vmax:         535.00 cm/s  Systemic VTI:  0.17 m MR Vmean:        360.0 cm/s   Systemic Diam: 1.95 cm MR PISA:         1.01 cm MR PISA Eff ROA: 7 mm MR PISA Radius:  0.40 cm MV E velocity: 45.50 cm/s MV A velocity: 85.10 cm/s MV E/A ratio:  0.53 Arvilla Meres MD Electronically signed by Arvilla Meres MD Signature Date/Time: 02/11/2023/2:06:19 PM    Final     Assessment & Plan:   Problem List Items Addressed This Visit     Hypothyroidism    Check TSH      Dyslipidemia    Cont on Simvastatin       OAB (overactive bladder)    Worse - ?multifactorial UA was normal [name] increased Memantine to 10 mg bid a month ago      Low back pain    Karena Addison finished PT      Memory impairment    [name] increased Memantine to 10 mg bid a month ago      Other Visit Diagnoses     Urinary frequency    -  Primary   Relevant Orders   POCT Urinalysis Dipstick (Automated) (Completed)         Meds ordered this encounter  Medications   cyanocobalamin (VITAMIN B12) 1000 MCG tablet    Sig: Take 1 tablet (1,000 mcg total) by mouth daily.    Dispense:  100 tablet    Refill:  3      Follow-up: Return in about 4 weeks (around 09/22/2023) for a follow-up visit.  Sonda Primes, MD

## 2023-08-25 NOTE — Assessment & Plan Note (Signed)
Huntley Dec increased Memantine to 10 mg bid a month ago

## 2023-08-25 NOTE — Assessment & Plan Note (Signed)
Worse - ?multifactorial UA was normal Huntley Dec increased Memantine to 10 mg bid a month ago

## 2023-08-30 ENCOUNTER — Ambulatory Visit: Payer: Medicare Other | Admitting: Internal Medicine

## 2023-08-31 ENCOUNTER — Ambulatory Visit: Payer: Medicare Other | Admitting: Internal Medicine

## 2023-08-31 DIAGNOSIS — L570 Actinic keratosis: Secondary | ICD-10-CM | POA: Diagnosis not present

## 2023-08-31 DIAGNOSIS — R238 Other skin changes: Secondary | ICD-10-CM | POA: Diagnosis not present

## 2023-09-01 DIAGNOSIS — Z23 Encounter for immunization: Secondary | ICD-10-CM | POA: Diagnosis not present

## 2023-09-04 ENCOUNTER — Other Ambulatory Visit: Payer: Self-pay | Admitting: Internal Medicine

## 2023-09-05 NOTE — Telephone Encounter (Signed)
Patient called and states that she only has 1 pill left.

## 2023-09-07 ENCOUNTER — Ambulatory Visit: Payer: Medicare Other | Attending: Sports Medicine | Admitting: Rheumatology

## 2023-09-07 ENCOUNTER — Encounter: Payer: Self-pay | Admitting: Rheumatology

## 2023-09-07 VITALS — BP 125/78 | HR 82 | Resp 16 | Ht 60.0 in | Wt 115.0 lb

## 2023-09-07 DIAGNOSIS — M25511 Pain in right shoulder: Secondary | ICD-10-CM | POA: Diagnosis not present

## 2023-09-07 DIAGNOSIS — M62830 Muscle spasm of back: Secondary | ICD-10-CM | POA: Diagnosis not present

## 2023-09-07 DIAGNOSIS — M79642 Pain in left hand: Secondary | ICD-10-CM | POA: Insufficient documentation

## 2023-09-07 DIAGNOSIS — M810 Age-related osteoporosis without current pathological fracture: Secondary | ICD-10-CM

## 2023-09-07 DIAGNOSIS — E034 Atrophy of thyroid (acquired): Secondary | ICD-10-CM

## 2023-09-07 DIAGNOSIS — G5703 Lesion of sciatic nerve, bilateral lower limbs: Secondary | ICD-10-CM | POA: Diagnosis not present

## 2023-09-07 DIAGNOSIS — Z8719 Personal history of other diseases of the digestive system: Secondary | ICD-10-CM

## 2023-09-07 DIAGNOSIS — M5030 Other cervical disc degeneration, unspecified cervical region: Secondary | ICD-10-CM

## 2023-09-07 DIAGNOSIS — I10 Essential (primary) hypertension: Secondary | ICD-10-CM | POA: Diagnosis not present

## 2023-09-07 DIAGNOSIS — M25642 Stiffness of left hand, not elsewhere classified: Secondary | ICD-10-CM | POA: Insufficient documentation

## 2023-09-07 DIAGNOSIS — M19042 Primary osteoarthritis, left hand: Secondary | ICD-10-CM

## 2023-09-07 DIAGNOSIS — M25512 Pain in left shoulder: Secondary | ICD-10-CM | POA: Insufficient documentation

## 2023-09-07 DIAGNOSIS — M51369 Other intervertebral disc degeneration, lumbar region without mention of lumbar back pain or lower extremity pain: Secondary | ICD-10-CM | POA: Diagnosis not present

## 2023-09-07 DIAGNOSIS — M25551 Pain in right hip: Secondary | ICD-10-CM | POA: Insufficient documentation

## 2023-09-07 DIAGNOSIS — M19041 Primary osteoarthritis, right hand: Secondary | ICD-10-CM | POA: Insufficient documentation

## 2023-09-07 DIAGNOSIS — G8929 Other chronic pain: Secondary | ICD-10-CM | POA: Diagnosis not present

## 2023-09-07 DIAGNOSIS — F4321 Adjustment disorder with depressed mood: Secondary | ICD-10-CM | POA: Diagnosis not present

## 2023-09-07 DIAGNOSIS — R2681 Unsteadiness on feet: Secondary | ICD-10-CM

## 2023-09-07 DIAGNOSIS — M19072 Primary osteoarthritis, left ankle and foot: Secondary | ICD-10-CM | POA: Insufficient documentation

## 2023-09-07 DIAGNOSIS — M19071 Primary osteoarthritis, right ankle and foot: Secondary | ICD-10-CM

## 2023-09-07 DIAGNOSIS — M533 Sacrococcygeal disorders, not elsewhere classified: Secondary | ICD-10-CM | POA: Insufficient documentation

## 2023-09-07 DIAGNOSIS — M25552 Pain in left hip: Secondary | ICD-10-CM | POA: Diagnosis not present

## 2023-09-07 DIAGNOSIS — M25641 Stiffness of right hand, not elsewhere classified: Secondary | ICD-10-CM | POA: Insufficient documentation

## 2023-09-07 DIAGNOSIS — Z8639 Personal history of other endocrine, nutritional and metabolic disease: Secondary | ICD-10-CM

## 2023-09-07 DIAGNOSIS — M79641 Pain in right hand: Secondary | ICD-10-CM | POA: Insufficient documentation

## 2023-09-07 DIAGNOSIS — M5382 Other specified dorsopathies, cervical region: Secondary | ICD-10-CM | POA: Diagnosis not present

## 2023-09-07 DIAGNOSIS — M5136 Other intervertebral disc degeneration, lumbar region: Secondary | ICD-10-CM

## 2023-09-07 MED ORDER — LIDOCAINE HCL 1 % IJ SOLN
0.5000 mL | INTRAMUSCULAR | Status: AC | PRN
Start: 2023-09-07 — End: 2023-09-07
  Administered 2023-09-07: .5 mL

## 2023-09-07 MED ORDER — TRIAMCINOLONE ACETONIDE 40 MG/ML IJ SUSP
20.0000 mg | INTRAMUSCULAR | Status: AC | PRN
Start: 2023-09-07 — End: 2023-09-07
  Administered 2023-09-07: 20 mg via INTRAMUSCULAR

## 2023-09-09 ENCOUNTER — Encounter: Payer: Medicare Other | Admitting: Psychology

## 2023-09-12 ENCOUNTER — Other Ambulatory Visit (INDEPENDENT_AMBULATORY_CARE_PROVIDER_SITE_OTHER): Payer: Medicare Other

## 2023-09-12 DIAGNOSIS — M545 Low back pain, unspecified: Secondary | ICD-10-CM

## 2023-09-12 DIAGNOSIS — F4321 Adjustment disorder with depressed mood: Secondary | ICD-10-CM | POA: Diagnosis not present

## 2023-09-12 DIAGNOSIS — G8929 Other chronic pain: Secondary | ICD-10-CM | POA: Diagnosis not present

## 2023-09-12 DIAGNOSIS — E034 Atrophy of thyroid (acquired): Secondary | ICD-10-CM

## 2023-09-12 DIAGNOSIS — I959 Hypotension, unspecified: Secondary | ICD-10-CM

## 2023-09-12 LAB — CBC WITH DIFFERENTIAL/PLATELET
Basophils Absolute: 0 10*3/uL (ref 0.0–0.1)
Basophils Relative: 0.6 % (ref 0.0–3.0)
Eosinophils Absolute: 0.1 10*3/uL (ref 0.0–0.7)
Eosinophils Relative: 1 % (ref 0.0–5.0)
HCT: 38.9 % (ref 36.0–46.0)
Hemoglobin: 12.7 g/dL (ref 12.0–15.0)
Lymphocytes Relative: 20.2 % (ref 12.0–46.0)
Lymphs Abs: 1.6 10*3/uL (ref 0.7–4.0)
MCHC: 32.6 g/dL (ref 30.0–36.0)
MCV: 93.2 fL (ref 78.0–100.0)
Monocytes Absolute: 0.8 10*3/uL (ref 0.1–1.0)
Monocytes Relative: 10.3 % (ref 3.0–12.0)
Neutro Abs: 5.5 10*3/uL (ref 1.4–7.7)
Neutrophils Relative %: 67.9 % (ref 43.0–77.0)
Platelets: 341 10*3/uL (ref 150.0–400.0)
RBC: 4.18 Mil/uL (ref 3.87–5.11)
RDW: 14.6 % (ref 11.5–15.5)
WBC: 8 10*3/uL (ref 4.0–10.5)

## 2023-09-12 LAB — COMPREHENSIVE METABOLIC PANEL
ALT: 31 U/L (ref 0–35)
AST: 17 U/L (ref 0–37)
Albumin: 4.3 g/dL (ref 3.5–5.2)
Alkaline Phosphatase: 49 U/L (ref 39–117)
BUN: 15 mg/dL (ref 6–23)
CO2: 25 meq/L (ref 19–32)
Calcium: 9.1 mg/dL (ref 8.4–10.5)
Chloride: 101 meq/L (ref 96–112)
Creatinine, Ser: 0.64 mg/dL (ref 0.40–1.20)
GFR: 80.71 mL/min (ref >60.00)
Glucose, Bld: 94 mg/dL (ref 70–99)
Potassium: 3.8 meq/L (ref 3.5–5.1)
Sodium: 135 meq/L (ref 135–145)
Total Bilirubin: 0.6 mg/dL (ref 0.2–1.2)
Total Protein: 7.5 g/dL (ref 6.0–8.3)

## 2023-09-12 LAB — LIPID PANEL
Cholesterol: 184 mg/dL (ref 0–200)
HDL: 59.9 mg/dL (ref >39.00)
LDL Cholesterol: 96 mg/dL (ref 0–99)
NonHDL: 123.73
Total CHOL/HDL Ratio: 3
Triglycerides: 139 mg/dL (ref 0.0–149.0)
VLDL: 27.8 mg/dL (ref 0.0–40.0)

## 2023-09-12 LAB — T4, FREE: Free T4: 1.16 ng/dL (ref 0.60–1.60)

## 2023-09-12 LAB — TSH: TSH: 1.7 u[IU]/mL (ref 0.35–5.50)

## 2023-09-14 DIAGNOSIS — H10413 Chronic giant papillary conjunctivitis, bilateral: Secondary | ICD-10-CM | POA: Diagnosis not present

## 2023-09-15 ENCOUNTER — Encounter: Payer: Self-pay | Admitting: Psychology

## 2023-09-15 ENCOUNTER — Ambulatory Visit (INDEPENDENT_AMBULATORY_CARE_PROVIDER_SITE_OTHER): Payer: Medicare Other | Admitting: Psychology

## 2023-09-15 ENCOUNTER — Ambulatory Visit: Payer: Medicare Other | Admitting: Psychology

## 2023-09-15 DIAGNOSIS — R4189 Other symptoms and signs involving cognitive functions and awareness: Secondary | ICD-10-CM

## 2023-09-15 DIAGNOSIS — G3184 Mild cognitive impairment, so stated: Secondary | ICD-10-CM | POA: Diagnosis not present

## 2023-09-15 DIAGNOSIS — I509 Heart failure, unspecified: Secondary | ICD-10-CM | POA: Insufficient documentation

## 2023-09-15 DIAGNOSIS — F0280 Dementia in other diseases classified elsewhere without behavioral disturbance: Secondary | ICD-10-CM | POA: Insufficient documentation

## 2023-09-15 HISTORY — DX: Mild cognitive impairment of uncertain or unknown etiology: G31.84

## 2023-09-15 NOTE — Progress Notes (Signed)
NEUROPSYCHOLOGICAL EVALUATION [name] Department of Neurology  Date of Evaluation: September 15, 2023  Reason for Referral:   [name] is a 87 y.o. right-handed Caucasian female referred by Marlowe Kays, PA-C, to characterize her current cognitive functioning and assist with diagnostic clarity and treatment planning in the context of concern surrounding progressive cognitive decline.   Assessment and Plan:   Clinical Impression(s): [name] pattern of performance is suggestive of a primary weakness surrounding delayed retrieval and recognition/consolidation aspects of memory. Further performance variability was exhibited across processing speed and visuospatial abilities. Performances were appropriate relative to age-matched peers across basic attention, cognitive flexibility, receptive language and expressive language, and encoding (i.e, learning) aspects of memory. Ms. Blizard denied difficulties completing instrumental activities of daily living (ADLs) independently. As such, given evidence for cognitive dysfunction described above, she meets criteria for a Mild Neurocognitive Disorder ("mild cognitive impairment") at the present time.  The etiology for ongoing memory dysfunction is unclear at the present time. Across memory testing, [name] demonstrated an adequate ability to learn novel information initially. However, after brief delays, retention rates ranged from 17% to 55%. While variable, performance across yes/no recognition trials was further poor across 2/3 memory tasks. Taken together, this could suggest very early concerns for rapid forgetting and an evolving storage impairment. This would subsequently raise concern for very early stages of an underlying neurodegenerative illness such as Alzheimer's disease. It is important to highlight that performances across all non-memory areas commonly implicated by the latter illness were appropriate. I  cannot rule out the presence of this illness in extremely early stages. However, it remains inappropriate to state the presence of this illness with any degree of confidence.   Other potential culprits for cognitive dysfunction would surround bereavement and other depressive symptoms, her myriad of medical conditions and chronic pain, and medication side effects (especially surrounding Ativan, tramadol, and prednisone). However, current memory performances align somewhat poorly with these potential culprits in my present opinion. Continued medical monitoring will be important moving forward to assess potential progressive decline.  Recommendations: A repeat neuropsychological evaluation in 12-18 months (or sooner if functional decline is noted) is recommended to assess the trajectory of future cognitive decline should it occur. This will also aid in future efforts towards improved diagnostic clarity.  [name] has already been prescribed a medication aimed to address memory loss and concerns surrounding Alzheimer's disease (i.e., memantine/Namenda). She is encouraged to continue taking this medication as prescribed. It is important to highlight that this medication has been shown to slow functional decline in some individuals. There is no current treatment which can stop or reverse cognitive decline when caused by a neurodegenerative illness.   Performance across neurocognitive testing is not a strong predictor of an individual's safety operating a motor vehicle. Should her family wish to pursue a formalized driving evaluation, they could reach out to the following agencies: [employer] in [place]: [phone] [facility]: [phone] [facility]: [phone] [facility]: [phone] or [phone]  Should there be progression of current deficits over time, [name] is unlikely to regain any independent living skills lost. Therefore, it is  recommended that she remain as involved as possible in all aspects of household chores, finances, and medication management, with supervision to ensure adequate performance. She will likely benefit from the establishment and maintenance of a routine in order to maximize her functional abilities over time.  It will be important for [name]  to have another person with her when in situations where she may need to process information, weigh the pros and cons of different options, and make decisions, in order to ensure that she fully understands and recalls all information to be considered.  If not already done, [name] and her family may want to discuss her wishes regarding durable power of attorney and medical decision making, so that she can have input into these choices. If they require legal assistance with this, long-term care resource access, or other aspects of estate planning, they could reach out to [employer] at [phone] for a free consultation.   [name] is encouraged to attend to lifestyle factors for brain health (e.g., regular physical exercise, good nutrition habits and consideration of the MIND-DASH diet, regular participation in cognitively-stimulating activities, and general stress management techniques), which are likely to have benefits for both emotional adjustment and cognition. In fact, in addition to promoting good general health, regular exercise incorporating aerobic activities (e.g., brisk walking, jogging, cycling, etc.) has been demonstrated to be a very effective treatment for depression and stress, with similar efficacy rates to both antidepressant medication and psychotherapy. Optimal control of vascular risk factors (including safe cardiovascular exercise and adherence to dietary recommendations) is encouraged. Continued participation in activities which provide mental stimulation and social interaction is also recommended.   Memory can be improved using internal  strategies such as rehearsal, repetition, chunking, mnemonics, association, and imagery. External strategies such as written notes in a consistently used memory journal, visual and nonverbal auditory cues such as a calendar on the refrigerator or appointments with alarm, such as on a cell phone, can also help maximize recall.    Because she shows better recall for structured information, she will likely understand and retain new information better if it is presented to her in a meaningful or well-organized manner at the outset, such as grouping items into meaningful categories or presenting information in an outlined, bulleted, or story format.  To address problems with processing speed, she may wish to consider:   -Ensuring that she is alerted when essential material or instructions are being presented   -Adjusting the speed at which new information is presented   -Allowing for more time in comprehending, processing, and responding in conversation   -Repeating and paraphrasing instructions or conversations aloud  Review of Records:   Ms. Gossman was seen by [facility] Marlowe Kays, PA-C) on 11/08/2022 for an evaluation of memory loss. At that time, family expressed concern surrounding rapid forgetting (i.e., her daughter noted that Ms. Whittenburg may forget things she was presented with 10 minutes prior). Her daughter reported memory concerns ongoing for the prior six months. Ms. Kaut did not appear to agree, stating that she did not feel she has trouble with remembering medications or appointments. Her husband passed in October 2023 which has created additional grief and stress. Performance across a brief cognitive screening instrument (MOCA) was 21/30. Ultimately, Ms. Weary was referred for a comprehensive neuropsychological evaluation to characterize her cognitive abilities and to assist with diagnostic clarity and treatment planning.   Neuroimaging Brain MRI on 07/12/2019 revealed a 16 mm  calcified meningioma in the left frontal convexity without significant brain edema. No age advanced atrophy or microvascular ischemic disease was noted. Brain MRI on 03/07/2020 was stable. Brain MRI on 04/26/2021 suggested mild atrophy and mild microvascular ischemic disease. Her meningioma was said to be stable. Brain MRI on 12/06/2022 suggested a chronic punctate microhemorrhage in the right frontoparietal  white matter but was otherwise stable.   Past Medical History:  Diagnosis Date   Allergic rhinitis due to animal (cat) (dog) hair and dander 08/07/2021   Allergic rhinitis due to pollen 08/07/2021   Aortic valve sclerosis 03/12/2015   Atrophic gastritis 09/03/2008   Atrophic vaginitis    Baker's cyst    Left-Dr. Aluisio   Bladder pain 08/19/2014   Chronic  Take Vesicare 10 mg/d  UA was ok  On Premarin cream PV per GYN  On Vesicare  F/u w/Dr Monico Blitz     Cataract    Dr. Dione Booze   Cervical spinal cord compression 05/04/2021   Cervical spine instability 05/15/2021   Dr. Venetia Maxon: plan -  posterior C1-2 decompression and instrumented fusion.  MRI: Progressive soft tissue pannus at C1-2 is now creating mass  effect on the craniocervical junction with distortion of the upper  spinal cord. This is likely related rheumatoid arthritis  C spine CT: Fracture of the anterior arch of C1 with 12 mm separation. Nondisplaced fracture posterior arch of C1. There is later   Cervical spondylosis 01/05/2019   Chalazion left upper eyelid 04/18/2023   X2  Keflex po - too big... Will use Ceftin  F/u w/Ophthalmology   CHF (congestive heart failure)    Cholelithiasis 09/09/2019   Per Dr Marina Goodell: "At this point we mutually decided to watch her abdominal complaints.  If symptoms accelerate or become more classic for symptomatic cholelithiasis, then she is agreeable to surgical referral.  She will keep me posted"   Chronic left SI joint pain 11/30/2022   Closed fracture of first cervical vertebra 08/07/2021   Closed fracture  of second cervical vertebra 08/07/2021   Concussion 10/29/2021   s/p a hard fall after she rushed and slipped on the wet marble floor while visiting family in [place] a couple week ago.  She hit the side of her skull, had a skin laceration bled quite a bit.  She was dazed and according to her son Lorin Picket had a loss of consciousness of under a minute duration.  She felt dazed.  She was taken to ER.  2 staples were applied to her skin laceration on the left o   Constipation 11/11/2016   Chronic, multifactorial  Miralax prn  7/22 Postop constipation related to oxycodone and Robaxin.  We can discontinue oxycodone Robaxin.  The patient will use tramadol and Tylenol instead.  Use MiraLAX or Senokot as to produce soft regular stools.  Discontinue Pepto-Bismol.  I think this should help with abdominal bloating complaint.      Take Metamucil daily  Senokot as to produce soft regular sto   Contact dermatitis and eczema 06/14/2018   Eczema vs other - 7/19  Cortaid prn  Depo-medrol IM 80 mg  Remove nail polish     Contusion of left chest wall 01/09/2015   Vannah fell and broke a rib #8 on the L last week, she had a rib X ray and a CT (Dr Thurston Hole).   Contusion of right knee 08/12/2016   R knee   Degenerative disc disease, cervical 03/11/2021   Duodenitis 09/03/2008   Essential hypertension, benign 07/27/2011   Chronic, mild  NAS diet  Cardiac CT scan for calcium scoring offered 1/20  1/24 HTN and s/p ER visit on 12/08/22 for SBP 180. Per Dr Lynelle Doctor: " Discussed with the patient that her restarting donepezil could have impacted her symptoms today.  However, her blood pressure continues to be mildly elevated.  Will place  her on 2.5 mg amlodipine to take if her blood pressure is over 170/100.  Nursing aide at   Fatigue 09/08/2014   10/15, 2/16, 9/17, 2/18  No other sx  Jahnice is managing it with short rest periods 1-2/d     Fibroid    Gait disorder 02/28/2022   Worse  Multifactorial  Cont w/PT  In the balance class   Balance class q 1 week     [name] resolved after doing exercises by Dr Darrick Penna 11/2021     Generalized anxiety disorder 08/22/2018   Chronic   Lexapro - not taking  Weighted blanket  Lorazepam prn, - tolerance has developed   Potential benefits of a long term benzodiazepines  use as well as potential risks  and complications were explained to the patient and were aknowledged.     GERD (gastroesophageal reflux disease) 11/20/2007   Chronic. Better on Gluten free diet  Dexilant   Potential benefits of a long term PPI  use as well as potential risks  and complications were explained to the patient and were aknowledged.  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     History of basal cell carcinoma 09/29/2022   History of colonic polyps 09/04/2008   Hyperlipemia, mixed 03/15/2011   Chronic  On Simvastatin     Hypotension 07/13/2023   Stop Amlodipine     Hypothyroidism    Impacted cerumen of right ear 07/16/2013   Increased frequency of urination 08/19/2014   Inflammation of sacroiliac joint 11/16/2022   Insomnia 01/24/2013   Lorazepam prn   Potential benefits of a long term benzodiazepines  use as well as potential risks  and complications were explained to the patient and were aknowledged.        Irritable bowel syndrome 09/04/2008   Chronic   Marina Goodell MD, Wilhemina Bonito  2013 - much better on gluten free diet              Lactose intolerance 11/20/2007   Lactaid        Lumbar degenerative disc disease 12/21/2022   Based on MRI and her exam there is significant DDD in lumbar spine     Lumbar spondylosis    Melanocytic nevi of trunk 02/22/2022   Meningioma 03/25/2020   Dr Venetia Maxon     Mild intermittent asthma 08/07/2021   Multiple thyroid nodules 10/04/2011   2013 she was started on Synthroid - tol well  2017, 2022 [place]      MVP (mitral valve prolapse)    Antibiotics required for dental procedures   Neck pain 12/26/2018   Chronic  PT, traction, TENs unit was offered  Tramadol prn   Potential benefits of a long term opioids use as well as potential risks (i.e. addiction risk, apnea etc) and complications (i.e. Somnolence, constipation and others) were explained to the patient and were aknowledged.        OAB (overactive bladder) 09/02/2010   Dr Earlene Plater  Use Solifenacin prn  Worse - ?multifactorial  UA was normal  Huntley Dec increased Memantine to 10 mg bid a month ago     Osteoarthritis 02/25/2008   Dr Ethelene Hal  Blue-Emu cream was recommended to use 2-3 times a  day        Osteoporosis 11/20/2007   Chronic  Per GYN        Overactive bladder    Pain of right sacroiliac joint 01/14/2023   Postherpetic neuralgia 10/02/2013   Pruritus of skin 03/11/2012   S/p H zoster vaccination  Relapsing  8/12 - on face  8/14 - R ear/face  10/14 ?     Sacroiliitis    Seborrheic dermatitis 02/22/2022   Sinusitis, chronic 08/06/2016   Worse  Flonase, Atrovent nasal, Singulair, Claritin  ENT ref Dr Dorma Russell  CT sinuses     Skin ulcer of nose 07/13/2023   Soft pads on the nose  Derm appt     Spondylolisthesis, cervical region 03/11/2021   Stool incontinence 11/16/2022   12/23  Worse.  Stool leakage - new  D/c Aricept  Abd X ray to rule out constipation     Tibialis tendinitis 03/20/2008   Torn meniscus    bilateral   Trochanteric bursitis of right hip 09/22/2020   Upper respiratory infection 08/03/2012   9/13 - 3 wks  2/17  11/18 - asthmatic bronchitis     Urinary urgency 08/19/2014   2023 worse.  Obtain urinalysis.  D/c Aricept  UA was ok  On Premarin cream PV per GYN  On Vesicare  F/u w/Dr Monico Blitz     Urticaria 02/22/2022    Past Surgical History:  Procedure Laterality Date   CATARACT EXTRACTION, BILATERAL     EYE SURGERY     POSTERIOR CERVICAL FUSION/FORAMINOTOMY N/A 05/15/2021   Procedure: Cervical One Laminectomy with resection of  cyst, Fixation from Occiput to Cervical Four;  Surgeon: [name], MD;  Location: [facility] OR;  Service: Neurosurgery;  Laterality: N/A;   TONSILLECTOMY      Current Outpatient Medications:    acetaminophen (TYLENOL) 500 MG tablet, Take 1,000 mg by mouth every 6 (six) hours as needed for mild pain., Disp: , Rfl:    albuterol (VENTOLIN HFA) 108 (90 Base) MCG/ACT inhaler, Take 2 puffs every 4-6 hours as needed, Disp: , Rfl:    amLODipine (NORVASC) 2.5 MG tablet, Take 2.5 mg by mouth daily., Disp: , Rfl:    amoxicillin-clavulanate (AUGMENTIN) 500-125 MG tablet, Take 1 tablet by mouth 2 (two) times daily., Disp: , Rfl:    Azelastine HCl 0.15 % SOLN, Place into both nostrils. Place into both nostrils., Disp: , Rfl:    Calcium Carbonate-Vitamin D (CALCIUM CARBONATE W/VITAMIN D PO), Take 1 tablet by mouth daily., Disp: , Rfl:    cyanocobalamin (VITAMIN B12) 1000 MCG tablet, Take 1 tablet (1,000 mcg total) by mouth daily., Disp: 100 tablet, Rfl: 3   desonide (DESOWEN) 0.05 % cream, Apply 1 application topically as needed., Disp: , Rfl:    diclofenac Sodium (VOLTAREN) 1 % GEL, APPLY 4 GRAMS 4 TIMES DAILY., Disp: 200 g, Rfl: 3   EPINEPHrine (EPIPEN 2-PAK) 0.3 mg/0.3 mL IJ SOAJ injection, Inject 0.3 mLs (0.3 mg total) into the muscle as needed., Disp: 1 Device, Rfl: 1   erythromycin ophthalmic ointment, Place 1 Application into both eyes at bedtime., Disp: 3.5 g, Rfl: 0   famotidine (PEPCID) 40 MG tablet, Take 1 tablet (40 mg total) by mouth at bedtime. NEEDS OFFICE VISIT FOR ADDITIONAL REFILLS, Disp: 30 tablet, Rfl: 2   Gauze Pads & Dressings (TELFA ADHESIVE DRESSING) 3"X4" PADS, Use qd, Disp: 20 each, Rfl: 0   hydrocortisone (ANUSOL-HC) 2.5 % rectal cream, Place 1 Application rectally 2 (two) times daily. Insert per  rectum, Disp: 14 g, Rfl: 1   ipratropium (ATROVENT) 0.06 % nasal spray, Place 1 spray into the nose 3 (three) times daily as needed for rhinitis., Disp: 15 mL, Rfl: 0   levocetirizine (XYZAL) 5  MG tablet, Take 5 mg by mouth every evening., Disp: , Rfl:    levothyroxine (SYNTHROID) 50 MCG tablet, TAKE ONE TABLET BY MOUTH DAILY BEFORE BREAKFAST, Disp: 30 tablet, Rfl: 5   loratadine (CLARITIN) 10 MG tablet, Take 1 tablet (10 mg total) by mouth daily., Disp: 100 tablet, Rfl: 3   LORazepam (ATIVAN) 1 MG tablet, Take 2-3 tablets at bedtime and 1-2 tablets in the daytime as needed., Disp: 150 tablet, Rfl: 2   memantine (NAMENDA) 10 MG tablet, Take 1 tablet (10 mg total) by mouth 2 (two) times daily., Disp: 60 tablet, Rfl: 11   montelukast (SINGULAIR) 10 MG tablet, Take 1 tablet (10 mg total) by mouth at bedtime., Disp: 90 tablet, Rfl: 1   oxymetazoline (AFRIN 12 HOUR) 0.05 % nasal spray, Place 1 spray into both nostrils daily at 2 am., Disp: , Rfl:    polyethylene glycol powder (GLYCOLAX/MIRALAX) 17 GM/SCOOP powder, Take 17 g by mouth 2 (two) times daily as needed for moderate constipation. (Patient not taking: Reported on 09/07/2023), Disp: 500 g, Rfl: 3   predniSONE (DELTASONE) 20 MG tablet, Take 1 tablet (20 mg total) by mouth 2 (two) times daily., Disp: 10 tablet, Rfl: 0   psyllium (METAMUCIL SMOOTH TEXTURE) 58.6 % powder, 1 scoop daily, Disp: 283 g, Rfl: 6   simvastatin (ZOCOR) 40 MG tablet, TAKE ONE TABLET ONCE DAILY, Disp: 90 tablet, Rfl: 3   solifenacin (VESICARE) 10 MG tablet, Take 1 tablet (10 mg total) by mouth daily as needed (bladder)., Disp: 90 tablet, Rfl: 3   traMADol (ULTRAM) 50 MG tablet, Take 1 tablet (50 mg total) by mouth every 6 (six) hours as needed for severe pain. (Patient not taking: Reported on 09/07/2023), Disp: 120 tablet, Rfl: 2   triamcinolone (NASACORT) 55 MCG/ACT AERO nasal inhaler, Place 2 sprays into the nose as needed., Disp: , Rfl:   Clinical Interview:   The following information was obtained during a clinical interview with Ms. Channing prior to cognitive testing.  Cognitive Symptoms: Decreased short-term memory: Denied. She did acknowledge instances where she  might have trouble recalling a name or specific detail. However, she stated that information generally comes with time and emphasized her belief that this is age appropriate. She did acknowledge that her family has expressed concern surrounding rapid forgetting and increased repetition in day-to-day conversation over the past year or so.  Decreased long-term memory: Denied. Decreased attention/concentration: Denied. Reduced processing speed: Denied. Difficulties with executive functions: Denied. She also denied trouble with impulsivity or any significant personality changes.  Difficulties with emotion regulation: Denied. Difficulties with receptive language: Denied. Difficulties with word finding: Denied. Decreased visuoperceptual ability: Denied.  Difficulties completing ADLs: She lives alone at [facility] and denied trouble with medication management, financial management, or bill paying responsibilities. She does not drive. However, this is due to prior neck surgery and her having a limited range of motion rather than cognitive dysfunction.   Additional Medical History: History of traumatic brain injury/concussion: She appeared unclear if she had ever been diagnosed with a concussion. Medical records suggest that she slipped and fell in November 2022 resulting in a brief loss in consciousness under one minute in length and a skin laceration which was repaired at a local ED. No more recent  falls were described. History of stroke: Denied. History of seizure activity: Denied. History of known exposure to toxins: Denied. Symptoms of chronic pain: Denied. Experience of frequent headaches/migraines: Denied. Frequent instances of dizziness/vertigo: Denied.  Sensory changes: She wears glasses with benefit. Other sensory changes/difficulties (e.g., hearing, taste, smell) were not reported.  Balance/coordination difficulties: Some reported some instability, largely attributed to  bilateral hip pain. She ambulates with a cane or walker while outside of her home primarily as a precaution to prevent future falls. She does work with an unspecified staff member at [employer] on improving her strength and balance several times per week. This was described as helpful.  Other motor difficulties: Denied.  Sleep History: Estimated hours obtained each night: 8-9 hours.  Difficulties falling asleep: Denied. Difficulties staying asleep: Endorsed. She reported waking to use the restroom several times per night but is generally able to fall back asleep quickly.  Feels rested and refreshed upon awakening: Endorsed.  History of snoring: Denied. History of waking up gasping for air: Denied. Witnessed breath cessation while asleep: Denied.  History of vivid dreaming: Denied. Excessive movement while asleep: Denied. Instances of acting out her dreams: Denied.  Psychiatric/Behavioral Health History: Depression: She described herself as "a little depressed," attributed to the passing of her husband in October 2023 and her being isolated from her family who live in other parts of the country. Prior to her husband's passing, she did not describe prominent psychiatric concerns or formal diagnoses. Current or remote suicidal ideation, intent, or plan was denied.  Anxiety: Denied. Mania: Denied. Trauma History: Denied. Visual/auditory hallucinations: Denied. Delusional thoughts: Denied.  Tobacco: Denied. Alcohol: She reported rare alcohol consumption in social settings and denied a history of problematic alcohol abuse or dependence.  Recreational drugs: Denied.  Family History: Problem Relation Age of Onset   Heart disease Mother    Hypertension Mother    Osteoporosis Mother    Congestive Heart Failure Mother    Pancreatic cancer Father    Heart attack Brother    Drug abuse Brother        over dose    Healthy Son    Healthy Son    Colon cancer Neg Hx    Colon polyps Neg Hx     Rectal cancer Neg Hx    Stomach cancer Neg Hx    This information was confirmed by Ms. Soter.  Academic/Vocational History: Highest level of educational attainment: 15 years. She graduated from high school and completed a three year business college program in [place]. She described herself as a good (A/B) student in academic settings. No relative weaknesses were identified.  History of developmental delay: Denied. History of grade repetition: Denied. Enrollment in special education courses: Denied. History of LD/ADHD: Denied.  Employment: Retired. 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   Evaluation Results:   Behavioral Observations: Ms. Wyss was unaccompanied, arrived to her appointment on time, and was appropriately dressed and groomed. She appeared alert and oriented. She ambulated with the assistance of a rolling walker and maneuvered this device well. No frank instability was observed. Gross motor functioning appeared intact upon informal observation and no abnormal movements (e.g., tremors) were noted. Her affect was generally relaxed and positive. Spontaneous speech was fluent and word finding difficulties were not observed during the clinical interview. Thought processes were coherent, organized, and normal in content. Insight into her cognitive difficulties appeared somewhat poor in that she denied all cognitive  concerns yet objective testing did reveal some ongoing impairments.   During testing, sustained attention was appropriate. Task engagement was adequate and she persisted when challenged. Significant repetition was exhibited across verbal fluency tasks. She fatigued as the evaluation progressed and exhibited diminishing testing tolerance. Due to these factors, the current evaluation did require some abbreviation. Overall, Ms. Baltes was cooperative with the clinical interview and subsequent testing procedures.    Adequacy of Effort: The validity of neuropsychological testing is limited by the extent to which the individual being tested may be assumed to have exerted adequate effort during testing. Ms. Gumina expressed her intention to perform to the best of her abilities and exhibited adequate task engagement and persistence. Scores across stand-alone and embedded performance validity measures were within expectation. As such, the results of the current evaluation are believed to be a valid representation of Ms. Ballman's current cognitive functioning.  Test Results: Ms. Conliffe was fully oriented at the time of the current evaluation.  Intellectual abilities based upon educational and vocational attainment were estimated to be in the average range. Premorbid abilities were estimated to be within the average range based upon a single-word reading test.   Processing speed was variable, ranging from the well below average to average normative ranges. Basic attention was exceptionally high. More complex attention (e.g., working memory) was unable to be assessed. Cognitive flexibility was average. Other aspects of executive functioning were unable to be assessed.  Receptive language abilities were unable to be assessed directly. Ms. Gair did not exhibit any difficulties comprehending task instructions and answered all questions asked of her appropriately. Assessed expressive language (e.g., verbal fluency and confrontation naming) was mildly variable but overall appropriate, ranging from the below average to above average normative ranges.    Assessed visuospatial/visuoconstructional abilities were variable, ranging from the well below average to average normative ranges.    Learning (i.e., encoding) of novel verbal information was below average to average. Spontaneous delayed recall (i.e., retrieval) of previously learned information was also below average to average. Retention rates were 55% across a story  learning task, 17% across a list learning task, and 35% across a figure drawing task. Performance across recognition tasks was variable, ranging from the well below average to average normative ranges, suggesting some evidence for information consolidation.   Results of emotional screening instruments suggested that recent symptoms of generalized anxiety were in the minimal range, while symptoms of depression were within normal limits. A screening instrument assessing recent sleep quality suggested the presence of minimal sleep dysfunction.  Tables of Scores:   Note: This summary of test scores accompanies the interpretive report and should not be considered in isolation without reference to the appropriate sections in the text. Descriptors are based on appropriate normative data and may be adjusted based on clinical judgment. Terms such as "Within Normal Limits" and "Outside Normal Limits" are used when a more specific description of the test score cannot be determined.       Percentile - Normative Descriptor > 98 - Exceptionally High 91-97 - Well Above Average 75-90 - Above Average 25-74 - Average 9-24 - Below Average 2-8 - Well Below Average < 2 - Exceptionally Low       Validity:   DESCRIPTOR       DCT: --- --- Within Normal Limits  RBANS EI: --- --- Within Normal Limits       Orientation:      Raw Score Percentile   NAB Orientation, Form 1 29/29 --- ---  Cognitive Screening:      Raw Score Percentile   SLUMS: 20/30 --- ---       RBANS, Form A: Standard Score/ Scaled Score Percentile   Total Score 90 25 Average  Immediate Memory 90 25 Average    List Learning 7 16 Below Average    Story Memory 10 50 Average  Visuospatial/Constructional 87 19 Below Average    Figure Copy 10 50 Average    Line Orientation 12/20 3-9 Well Below Average  Language 103 58 Average    Picture Naming 9/10 26-50 Average    Semantic Fluency 10 50 Average  Attention 115 84 Above Average     Digit Span 17 99 Exceptionally High    Coding 8 25 Average  Delayed Memory 71 3 Well Below Average    List Recall 1/10 10-16 Below Average    List Recognition 16/20 3-9 Well Below Average    Story Recall 8 25 Average    Story Recognition 10/12 53-67 Average    Figure Recall 6 9 Below Average    Figure Recognition 3/8 6-20 Well Below Average to Below Average        Intellectual Functioning:      Standard Score Percentile   Test of Premorbid Functioning: 107 68 Average       Attention/Executive Function:     Trail Making Test (TMT): Raw Score (T Score) Percentile     Part A 66 secs.,  1 error (34) 5 Well Below Average    Part B 121 secs.,  0 errors (48) 42 Average        Language:     Verbal Fluency Test: Raw Score (Scaled Score) Percentile     Phonemic Fluency (CFL) 28 (9) 37 Average    Category Fluency 40 (12) 75 Above Average  *Based on [employer] Older Normative Studies (MOANS)          NAB Language Module, Form 1: T Score Percentile     Naming 25/31 (37) 9 Below Average       Visuospatial/Visuoconstruction:      Raw Score Percentile   Clock Drawing: 9/10 --- Within Normal Limits       Mood and Personality:      Raw Score Percentile   Geriatric Depression Scale: 7 --- Within Normal Limits  Geriatric Anxiety Scale: 10 --- Minimal    Somatic 4 --- Minimal    Cognitive 3 --- Mild    Affective 3 --- Minimal       Additional Questionnaires:      Raw Score Percentile   PROMIS Sleep Disturbance Questionnaire: 14 --- None to Slight   Informed Consent and Coding/Compliance:   The current evaluation represents a clinical evaluation for the purposes previously outlined by the referral source and is in no way reflective of a forensic evaluation.   Ms. Maloney was provided with a verbal description of the nature and purpose of the present neuropsychological evaluation. Also reviewed were the foreseeable risks and/or discomforts and benefits of the procedure, limits of  confidentiality, and mandatory reporting requirements of this provider. The patient was given the opportunity to ask questions and receive answers about the evaluation. Oral consent to participate was provided by the patient.   This evaluation was conducted by Newman Nickels, Ph.D., ABPP-CN, board certified clinical neuropsychologist. Ms. Caraveo completed a clinical interview with Dr. Milbert Coulter, billed as one unit [id number], and 105 minutes of cognitive testing and scoring, billed as one unit [phone] and three additional units  [id number]. Psychometrist Wallace Keller, B.S. assisted Dr. Milbert Coulter with test administration and scoring procedures. As a separate and discrete service, one unit [id number] and two units [phone] were billed for Dr. Tammy Sours time spent in interpretation and report writing.

## 2023-09-15 NOTE — Progress Notes (Signed)
   Psychometrician Note   Cognitive testing was administered to [name] by Wallace Keller, B.S. (psychometrist) under the supervision of Dr. Newman Nickels, Ph.D., licensed psychologist on 09/15/2023. Ms. Biermann did not appear overtly distressed by the testing session per behavioral observation or responses across self-report questionnaires. Rest breaks were offered.    The battery of tests administered was selected by Dr. Newman Nickels, Ph.D. with consideration to Ms. Arrighi's current level of functioning, the nature of her symptoms, emotional and behavioral responses during interview, level of literacy, observed level of motivation/effort, and the nature of the referral question. This battery was communicated to the psychometrist. Communication between Dr. Newman Nickels, Ph.D. and the psychometrist was ongoing throughout the evaluation and Dr. Newman Nickels, Ph.D. was immediately accessible at all times. Dr. Newman Nickels, Ph.D. provided supervision to the psychometrist on the date of this service to the extent necessary to assure the quality of all services provided.    MAGIE CIAMPA will return within approximately 1-2 weeks for an interactive feedback session with Dr. Milbert Coulter at which time her test performances, clinical impressions, and treatment recommendations will be reviewed in detail. Ms. Moseman understands she can contact our office should she require our assistance before this time.  A total of 105 minutes of billable time were spent face-to-face with Ms. Weinreb by the psychometrist. This includes both test administration and scoring time. Billing for these services is reflected in the clinical report generated by Dr. Newman Nickels, Ph.D.  This note reflects time spent with the psychometrician and does not include test scores or any clinical interpretations made by Dr. Milbert Coulter. The full report will follow in a separate note.

## 2023-09-19 DIAGNOSIS — R3989 Other symptoms and signs involving the genitourinary system: Secondary | ICD-10-CM | POA: Diagnosis not present

## 2023-09-19 DIAGNOSIS — R3915 Urgency of urination: Secondary | ICD-10-CM | POA: Diagnosis not present

## 2023-09-19 DIAGNOSIS — R350 Frequency of micturition: Secondary | ICD-10-CM | POA: Diagnosis not present

## 2023-09-19 DIAGNOSIS — N3281 Overactive bladder: Secondary | ICD-10-CM | POA: Diagnosis not present

## 2023-09-20 ENCOUNTER — Ambulatory Visit: Payer: Medicare Other | Admitting: Internal Medicine

## 2023-09-20 ENCOUNTER — Encounter: Payer: Self-pay | Admitting: Internal Medicine

## 2023-09-20 VITALS — Ht 60.0 in

## 2023-09-20 DIAGNOSIS — R269 Unspecified abnormalities of gait and mobility: Secondary | ICD-10-CM | POA: Diagnosis not present

## 2023-09-20 DIAGNOSIS — N3281 Overactive bladder: Secondary | ICD-10-CM | POA: Diagnosis not present

## 2023-09-20 DIAGNOSIS — H6123 Impacted cerumen, bilateral: Secondary | ICD-10-CM

## 2023-09-20 DIAGNOSIS — F4321 Adjustment disorder with depressed mood: Secondary | ICD-10-CM

## 2023-09-20 MED ORDER — NEOMYCIN-POLYMYXIN-HC 3.5-10000-1 OT SOLN
3.0000 [drp] | Freq: Three times a day (TID) | OTIC | 0 refills | Status: AC
Start: 2023-09-20 — End: 2023-12-29

## 2023-09-20 NOTE — Assessment & Plan Note (Signed)
[name] passed away on Sep 04, 2022

## 2023-09-20 NOTE — Patient Instructions (Signed)
Debrox wax removing kit  [employer] - [employer]

## 2023-09-20 NOTE — Assessment & Plan Note (Signed)
[name] - [name] is seeing Dr Oneida Alar - appt tomorrow Started PT at [facility]. [name] is worse after PT. Pt did not sleep last night. Pain irradiated down the R foot. Using a walker now.

## 2023-09-20 NOTE — Progress Notes (Signed)
Subjective:  Patient ID: [name], female    DOB: 1936-02-26  Age: 87 y.o. MRN: [id number]  CC: Follow-up (4 week f/u)   HPI [name] presents for OAB, wax, hypothyroidism C/ R ear wax > L  Outpatient Medications Prior to Visit  Medication Sig Dispense Refill   acetaminophen (TYLENOL) 500 MG tablet Take 1,000 mg by mouth every 6 (six) hours as needed for mild pain.     albuterol (VENTOLIN HFA) 108 (90 Base) MCG/ACT inhaler Take 2 puffs every 4-6 hours as needed     amLODipine (NORVASC) 2.5 MG tablet Take 2.5 mg by mouth daily.     Azelastine HCl 0.15 % SOLN Place into both nostrils. Place into both nostrils.     Calcium Carbonate-Vitamin D (CALCIUM CARBONATE W/VITAMIN D PO) Take 1 tablet by mouth daily.     cyanocobalamin (VITAMIN B12) 1000 MCG tablet Take 1 tablet (1,000 mcg total) by mouth daily. 100 tablet 3   desonide (DESOWEN) 0.05 % cream Apply 1 application topically as needed.     diclofenac Sodium (VOLTAREN) 1 % GEL APPLY 4 GRAMS 4 TIMES DAILY. 200 g 3   EPINEPHrine (EPIPEN 2-PAK) 0.3 mg/0.3 mL IJ SOAJ injection Inject 0.3 mLs (0.3 mg total) into the muscle as needed. 1 Device 1   erythromycin ophthalmic ointment Place 1 Application into both eyes at bedtime. 3.5 g 0   famotidine (PEPCID) 40 MG tablet Take 1 tablet (40 mg total) by mouth at bedtime. NEEDS OFFICE VISIT FOR ADDITIONAL REFILLS 30 tablet 2   Gauze Pads & Dressings (TELFA ADHESIVE DRESSING) 3"X4" PADS Use qd 20 each 0   hydrocortisone (ANUSOL-HC) 2.5 % rectal cream Place 1 Application rectally 2 (two) times daily. Insert per rectum 14 g 1   ipratropium (ATROVENT) 0.06 % nasal spray Place 1 spray into the nose 3 (three) times daily as needed for rhinitis. 15 mL 0   levocetirizine (XYZAL) 5 MG tablet Take 5 mg by mouth every evening.     levothyroxine (SYNTHROID) 50 MCG tablet TAKE ONE TABLET BY MOUTH DAILY BEFORE BREAKFAST 30 tablet 5   loratadine (CLARITIN) 10 MG tablet Take 1 tablet (10 mg total) by mouth  daily. 100 tablet 3   LORazepam (ATIVAN) 1 MG tablet Take 2-3 tablets at bedtime and 1-2 tablets in the daytime as needed. 150 tablet 2   memantine (NAMENDA) 10 MG tablet Take 1 tablet (10 mg total) by mouth 2 (two) times daily. 60 tablet 11   montelukast (SINGULAIR) 10 MG tablet Take 1 tablet (10 mg total) by mouth at bedtime. 90 tablet 1   oxymetazoline (AFRIN 12 HOUR) 0.05 % nasal spray Place 1 spray into both nostrils daily at 2 am.     pantoprazole (PROTONIX) 40 MG tablet Take 40 mg by mouth 2 (two) times daily.     prednisoLONE acetate (PRED FORTE) 1 % ophthalmic suspension 1 drop 3 (three) times daily.     predniSONE (DELTASONE) 20 MG tablet Take 1 tablet (20 mg total) by mouth 2 (two) times daily. 10 tablet 0   psyllium (METAMUCIL SMOOTH TEXTURE) 58.6 % powder 1 scoop daily 283 g 6   simvastatin (ZOCOR) 40 MG tablet TAKE ONE TABLET ONCE DAILY 90 tablet 3   solifenacin (VESICARE) 10 MG tablet Take 1 tablet (10 mg total) by mouth daily as needed (bladder). 90 tablet 3   triamcinolone (NASACORT) 55 MCG/ACT AERO nasal inhaler Place 2 sprays into the nose as needed.  Motor: No abnormal muscle tone.     Coordination: Coordination normal.     Deep Tendon Reflexes: Reflexes normal.  Psychiatric:        Behavior: Behavior normal.        Thought Content: Thought content normal.        Judgment: Judgment normal.   Using a walker  Will try to remove cerumen impaction out of the right ear with a plastic loop.  It was painful and we stopped. No bleeding Cortisporin eardrops prescribed just in case.  Lab Results  Component Value Date   WBC 8.0 09/12/2023   HGB 12.7 09/12/2023   HCT 38.9 09/12/2023   PLT 341.0 09/12/2023   GLUCOSE 94 09/12/2023   CHOL 184 09/12/2023   TRIG 139.0 09/12/2023   HDL 59.90 09/12/2023   LDLDIRECT 98.0 08/13/2021   LDLCALC 96 09/12/2023   ALT 31 09/12/2023   AST 17 09/12/2023   NA 135 09/12/2023   K 3.8 09/12/2023   CL 101 09/12/2023   CREATININE 0.64 09/12/2023   BUN 15 09/12/2023   CO2 25 09/12/2023   TSH 1.70 09/12/2023   HGBA1C 5.5 06/13/2020    ECHOCARDIOGRAM COMPLETE  Result Date: 02/11/2023    ECHOCARDIOGRAM REPORT   Patient Name:   [name] Date of Exam: 02/11/2023 Medical Rec #:  [id number]     Height:       60.5 in Accession #:    [id number]    Weight:       116.0 lb Date of Birth:  1936/09/29      BSA:          1.490 m Patient Age:    86 years      BP:           133/76 mmHg Patient Gender: F             HR:           73 bpm. Exam Location:  Outpatient Procedure: 2D Echo, Color Doppler and Cardiac Doppler Indications:    I50.9* Heart failure (unspecified)  History:        Patient has prior history of Echocardiogram examinations, most                 recent 06/25/2022. Risk Factors:Dyslipidemia.  Sonographer:     Irving Burton Senior RDCS Referring Phys: [id number] DANIEL R BENSIMHON IMPRESSIONS  1. Left ventricular ejection fraction, by estimation, is 55 to 60%. The left ventricle has normal function. The left ventricle has no regional wall motion abnormalities. Left ventricular diastolic parameters are consistent with Grade I diastolic dysfunction (impaired relaxation).  2. Right ventricular systolic function is normal. The right ventricular size is normal. There is normal pulmonary artery systolic pressure.  3. The mitral valve is normal in structure. Mild mitral valve regurgitation. No evidence of mitral stenosis.  4. The aortic valve is tricuspid. There is mild calcification of the aortic valve. Aortic valve regurgitation is mild. Aortic valve sclerosis/calcification is present, without any evidence of aortic stenosis. Aortic regurgitation PHT measures 436 msec. Aortic valve area, by VTI measures 1.81 cm. Aortic valve mean gradient measures 5.0 mmHg. Aortic valve Vmax measures 1.45 m/s.  5. The inferior vena cava is normal in size with greater than 50% respiratory variability, suggesting right atrial pressure of 3 mmHg. FINDINGS  Left Ventricle: Left ventricular ejection fraction, by estimation, is 55 to 60%. The left ventricle has normal function. The left ventricle has no regional wall motion abnormalities. The left  Motor: No abnormal muscle tone.     Coordination: Coordination normal.     Deep Tendon Reflexes: Reflexes normal.  Psychiatric:        Behavior: Behavior normal.        Thought Content: Thought content normal.        Judgment: Judgment normal.   Using a walker  Will try to remove cerumen impaction out of the right ear with a plastic loop.  It was painful and we stopped. No bleeding Cortisporin eardrops prescribed just in case.  Lab Results  Component Value Date   WBC 8.0 09/12/2023   HGB 12.7 09/12/2023   HCT 38.9 09/12/2023   PLT 341.0 09/12/2023   GLUCOSE 94 09/12/2023   CHOL 184 09/12/2023   TRIG 139.0 09/12/2023   HDL 59.90 09/12/2023   LDLDIRECT 98.0 08/13/2021   LDLCALC 96 09/12/2023   ALT 31 09/12/2023   AST 17 09/12/2023   NA 135 09/12/2023   K 3.8 09/12/2023   CL 101 09/12/2023   CREATININE 0.64 09/12/2023   BUN 15 09/12/2023   CO2 25 09/12/2023   TSH 1.70 09/12/2023   HGBA1C 5.5 06/13/2020    ECHOCARDIOGRAM COMPLETE  Result Date: 02/11/2023    ECHOCARDIOGRAM REPORT   Patient Name:   [name] Date of Exam: 02/11/2023 Medical Rec #:  [id number]     Height:       60.5 in Accession #:    [id number]    Weight:       116.0 lb Date of Birth:  1936/09/29      BSA:          1.490 m Patient Age:    86 years      BP:           133/76 mmHg Patient Gender: F             HR:           73 bpm. Exam Location:  Outpatient Procedure: 2D Echo, Color Doppler and Cardiac Doppler Indications:    I50.9* Heart failure (unspecified)  History:        Patient has prior history of Echocardiogram examinations, most                 recent 06/25/2022. Risk Factors:Dyslipidemia.  Sonographer:     Irving Burton Senior RDCS Referring Phys: [id number] DANIEL R BENSIMHON IMPRESSIONS  1. Left ventricular ejection fraction, by estimation, is 55 to 60%. The left ventricle has normal function. The left ventricle has no regional wall motion abnormalities. Left ventricular diastolic parameters are consistent with Grade I diastolic dysfunction (impaired relaxation).  2. Right ventricular systolic function is normal. The right ventricular size is normal. There is normal pulmonary artery systolic pressure.  3. The mitral valve is normal in structure. Mild mitral valve regurgitation. No evidence of mitral stenosis.  4. The aortic valve is tricuspid. There is mild calcification of the aortic valve. Aortic valve regurgitation is mild. Aortic valve sclerosis/calcification is present, without any evidence of aortic stenosis. Aortic regurgitation PHT measures 436 msec. Aortic valve area, by VTI measures 1.81 cm. Aortic valve mean gradient measures 5.0 mmHg. Aortic valve Vmax measures 1.45 m/s.  5. The inferior vena cava is normal in size with greater than 50% respiratory variability, suggesting right atrial pressure of 3 mmHg. FINDINGS  Left Ventricle: Left ventricular ejection fraction, by estimation, is 55 to 60%. The left ventricle has normal function. The left ventricle has no regional wall motion abnormalities. The left  Motor: No abnormal muscle tone.     Coordination: Coordination normal.     Deep Tendon Reflexes: Reflexes normal.  Psychiatric:        Behavior: Behavior normal.        Thought Content: Thought content normal.        Judgment: Judgment normal.   Using a walker  Will try to remove cerumen impaction out of the right ear with a plastic loop.  It was painful and we stopped. No bleeding Cortisporin eardrops prescribed just in case.  Lab Results  Component Value Date   WBC 8.0 09/12/2023   HGB 12.7 09/12/2023   HCT 38.9 09/12/2023   PLT 341.0 09/12/2023   GLUCOSE 94 09/12/2023   CHOL 184 09/12/2023   TRIG 139.0 09/12/2023   HDL 59.90 09/12/2023   LDLDIRECT 98.0 08/13/2021   LDLCALC 96 09/12/2023   ALT 31 09/12/2023   AST 17 09/12/2023   NA 135 09/12/2023   K 3.8 09/12/2023   CL 101 09/12/2023   CREATININE 0.64 09/12/2023   BUN 15 09/12/2023   CO2 25 09/12/2023   TSH 1.70 09/12/2023   HGBA1C 5.5 06/13/2020    ECHOCARDIOGRAM COMPLETE  Result Date: 02/11/2023    ECHOCARDIOGRAM REPORT   Patient Name:   [name] Date of Exam: 02/11/2023 Medical Rec #:  [id number]     Height:       60.5 in Accession #:    [id number]    Weight:       116.0 lb Date of Birth:  1936/09/29      BSA:          1.490 m Patient Age:    86 years      BP:           133/76 mmHg Patient Gender: F             HR:           73 bpm. Exam Location:  Outpatient Procedure: 2D Echo, Color Doppler and Cardiac Doppler Indications:    I50.9* Heart failure (unspecified)  History:        Patient has prior history of Echocardiogram examinations, most                 recent 06/25/2022. Risk Factors:Dyslipidemia.  Sonographer:     Irving Burton Senior RDCS Referring Phys: [id number] DANIEL R BENSIMHON IMPRESSIONS  1. Left ventricular ejection fraction, by estimation, is 55 to 60%. The left ventricle has normal function. The left ventricle has no regional wall motion abnormalities. Left ventricular diastolic parameters are consistent with Grade I diastolic dysfunction (impaired relaxation).  2. Right ventricular systolic function is normal. The right ventricular size is normal. There is normal pulmonary artery systolic pressure.  3. The mitral valve is normal in structure. Mild mitral valve regurgitation. No evidence of mitral stenosis.  4. The aortic valve is tricuspid. There is mild calcification of the aortic valve. Aortic valve regurgitation is mild. Aortic valve sclerosis/calcification is present, without any evidence of aortic stenosis. Aortic regurgitation PHT measures 436 msec. Aortic valve area, by VTI measures 1.81 cm. Aortic valve mean gradient measures 5.0 mmHg. Aortic valve Vmax measures 1.45 m/s.  5. The inferior vena cava is normal in size with greater than 50% respiratory variability, suggesting right atrial pressure of 3 mmHg. FINDINGS  Left Ventricle: Left ventricular ejection fraction, by estimation, is 55 to 60%. The left ventricle has normal function. The left ventricle has no regional wall motion abnormalities. The left  Motor: No abnormal muscle tone.     Coordination: Coordination normal.     Deep Tendon Reflexes: Reflexes normal.  Psychiatric:        Behavior: Behavior normal.        Thought Content: Thought content normal.        Judgment: Judgment normal.   Using a walker  Will try to remove cerumen impaction out of the right ear with a plastic loop.  It was painful and we stopped. No bleeding Cortisporin eardrops prescribed just in case.  Lab Results  Component Value Date   WBC 8.0 09/12/2023   HGB 12.7 09/12/2023   HCT 38.9 09/12/2023   PLT 341.0 09/12/2023   GLUCOSE 94 09/12/2023   CHOL 184 09/12/2023   TRIG 139.0 09/12/2023   HDL 59.90 09/12/2023   LDLDIRECT 98.0 08/13/2021   LDLCALC 96 09/12/2023   ALT 31 09/12/2023   AST 17 09/12/2023   NA 135 09/12/2023   K 3.8 09/12/2023   CL 101 09/12/2023   CREATININE 0.64 09/12/2023   BUN 15 09/12/2023   CO2 25 09/12/2023   TSH 1.70 09/12/2023   HGBA1C 5.5 06/13/2020    ECHOCARDIOGRAM COMPLETE  Result Date: 02/11/2023    ECHOCARDIOGRAM REPORT   Patient Name:   [name] Date of Exam: 02/11/2023 Medical Rec #:  [id number]     Height:       60.5 in Accession #:    [id number]    Weight:       116.0 lb Date of Birth:  1936/09/29      BSA:          1.490 m Patient Age:    86 years      BP:           133/76 mmHg Patient Gender: F             HR:           73 bpm. Exam Location:  Outpatient Procedure: 2D Echo, Color Doppler and Cardiac Doppler Indications:    I50.9* Heart failure (unspecified)  History:        Patient has prior history of Echocardiogram examinations, most                 recent 06/25/2022. Risk Factors:Dyslipidemia.  Sonographer:     Irving Burton Senior RDCS Referring Phys: [id number] DANIEL R BENSIMHON IMPRESSIONS  1. Left ventricular ejection fraction, by estimation, is 55 to 60%. The left ventricle has normal function. The left ventricle has no regional wall motion abnormalities. Left ventricular diastolic parameters are consistent with Grade I diastolic dysfunction (impaired relaxation).  2. Right ventricular systolic function is normal. The right ventricular size is normal. There is normal pulmonary artery systolic pressure.  3. The mitral valve is normal in structure. Mild mitral valve regurgitation. No evidence of mitral stenosis.  4. The aortic valve is tricuspid. There is mild calcification of the aortic valve. Aortic valve regurgitation is mild. Aortic valve sclerosis/calcification is present, without any evidence of aortic stenosis. Aortic regurgitation PHT measures 436 msec. Aortic valve area, by VTI measures 1.81 cm. Aortic valve mean gradient measures 5.0 mmHg. Aortic valve Vmax measures 1.45 m/s.  5. The inferior vena cava is normal in size with greater than 50% respiratory variability, suggesting right atrial pressure of 3 mmHg. FINDINGS  Left Ventricle: Left ventricular ejection fraction, by estimation, is 55 to 60%. The left ventricle has normal function. The left ventricle has no regional wall motion abnormalities. The left

## 2023-09-20 NOTE — Assessment & Plan Note (Addendum)
Use Solifenacin 10 mg/d, estrace cream vaginally

## 2023-09-22 ENCOUNTER — Ambulatory Visit (INDEPENDENT_AMBULATORY_CARE_PROVIDER_SITE_OTHER): Payer: Medicare Other | Admitting: Psychology

## 2023-09-22 DIAGNOSIS — G3184 Mild cognitive impairment, so stated: Secondary | ICD-10-CM

## 2023-09-22 NOTE — Progress Notes (Signed)
   Neuropsychology Feedback Session [name]. [facility] [place] Department of Neurology  Reason for Referral:   [name] is a 87 y.o. right-handed Caucasian female referred by Marlowe Kays, PA-C, to characterize her current cognitive functioning and assist with diagnostic clarity and treatment planning in the context of concern surrounding progressive cognitive decline.   Feedback:   [name] completed a comprehensive neuropsychological evaluation on 09/15/2023. Please refer to that encounter for the full report and recommendations. Briefly, results suggested a primary weakness surrounding delayed retrieval and recognition/consolidation aspects of memory. Further performance variability was exhibited across processing speed and visuospatial abilities. The etiology for ongoing memory dysfunction is unclear at the present time. Across memory testing, [name] demonstrated an adequate ability to learn novel information initially. However, after brief delays, retention rates ranged from 17% to 55%. While variable, performance across yes/no recognition trials was further poor across 2/3 memory tasks. Taken together, this could suggest very early concerns for rapid forgetting and an evolving storage impairment. This would subsequently raise concern for very early stages of an underlying neurodegenerative illness such as Alzheimer's disease. It is important to highlight that performances across all non-memory areas commonly implicated by the latter illness were appropriate. I cannot rule out the presence of this illness in extremely early stages. However, it remains inappropriate to state the presence of this illness with any degree of confidence. Other potential culprits for cognitive dysfunction would surround bereavement and other depressive symptoms, her myriad of medical conditions and chronic pain, and medication side effects (especially surrounding Ativan, tramadol, and  prednisone).  [name] was accompanied during the feedback session by her two sons via speakerphone. Content of the current session focused on the results of her neuropsychological evaluation. [name] was given the opportunity to ask questions and her questions were answered. She was encouraged to reach out should additional questions arise. A copy of her report was provided at the conclusion of the visit.      One unit [phone] was billed for Dr. Tammy Sours time spent preparing for, conducting, and documenting the current feedback session with [name].

## 2023-09-23 NOTE — Assessment & Plan Note (Signed)
I tried to remove cerumen impaction out of the right ear with a plastic loop.  It was painful and we stopped. No bleeding Cortisporin eardrops prescribed just in case. Use Debrox wax removing At home She ENT if needed

## 2023-09-25 ENCOUNTER — Other Ambulatory Visit: Payer: Medicare Other

## 2023-10-04 DIAGNOSIS — L910 Hypertrophic scar: Secondary | ICD-10-CM | POA: Diagnosis not present

## 2023-10-04 DIAGNOSIS — T148XXA Other injury of unspecified body region, initial encounter: Secondary | ICD-10-CM | POA: Diagnosis not present

## 2023-10-05 ENCOUNTER — Other Ambulatory Visit (INDEPENDENT_AMBULATORY_CARE_PROVIDER_SITE_OTHER): Payer: Medicare Other

## 2023-10-05 DIAGNOSIS — E034 Atrophy of thyroid (acquired): Secondary | ICD-10-CM

## 2023-10-05 DIAGNOSIS — G8929 Other chronic pain: Secondary | ICD-10-CM | POA: Diagnosis not present

## 2023-10-05 DIAGNOSIS — M545 Low back pain, unspecified: Secondary | ICD-10-CM | POA: Diagnosis not present

## 2023-10-05 DIAGNOSIS — I959 Hypotension, unspecified: Secondary | ICD-10-CM | POA: Diagnosis not present

## 2023-10-05 LAB — COMPREHENSIVE METABOLIC PANEL
ALT: 23 U/L (ref 0–35)
AST: 22 U/L (ref 0–37)
Albumin: 4.2 g/dL (ref 3.5–5.2)
Alkaline Phosphatase: 40 U/L (ref 39–117)
BUN: 15 mg/dL (ref 6–23)
CO2: 25 meq/L (ref 19–32)
Calcium: 9.1 mg/dL (ref 8.4–10.5)
Chloride: 101 meq/L (ref 96–112)
Creatinine, Ser: 0.8 mg/dL (ref 0.40–1.20)
GFR: 67.26 mL/min (ref >60.00)
Glucose, Bld: 94 mg/dL (ref 70–99)
Potassium: 4 meq/L (ref 3.5–5.1)
Sodium: 134 meq/L — ABNORMAL LOW (ref 135–145)
Total Bilirubin: 0.5 mg/dL (ref 0.2–1.2)
Total Protein: 7.4 g/dL (ref 6.0–8.3)

## 2023-10-05 LAB — CBC WITH DIFFERENTIAL/PLATELET
Basophils Absolute: 0 10*3/uL (ref 0.0–0.1)
Basophils Relative: 0.5 % (ref 0.0–3.0)
Eosinophils Absolute: 0.1 10*3/uL (ref 0.0–0.7)
Eosinophils Relative: 1.1 % (ref 0.0–5.0)
HCT: 39.6 % (ref 36.0–46.0)
Hemoglobin: 12.8 g/dL (ref 12.0–15.0)
Lymphocytes Relative: 15.7 % (ref 12.0–46.0)
Lymphs Abs: 1.2 10*3/uL (ref 0.7–4.0)
MCHC: 32.3 g/dL (ref 30.0–36.0)
MCV: 93.6 fL (ref 78.0–100.0)
Monocytes Absolute: 0.7 10*3/uL (ref 0.1–1.0)
Monocytes Relative: 9.4 % (ref 3.0–12.0)
Neutro Abs: 5.5 10*3/uL (ref 1.4–7.7)
Neutrophils Relative %: 73.3 % (ref 43.0–77.0)
Platelets: 267 10*3/uL (ref 150.0–400.0)
RBC: 4.23 Mil/uL (ref 3.87–5.11)
RDW: 15.4 % (ref 11.5–15.5)
WBC: 7.5 10*3/uL (ref 4.0–10.5)

## 2023-10-12 ENCOUNTER — Ambulatory Visit: Payer: Medicare Other | Admitting: Internal Medicine

## 2023-10-12 ENCOUNTER — Encounter: Payer: Self-pay | Admitting: Internal Medicine

## 2023-10-12 VITALS — BP 128/88 | HR 70 | Temp 97.8°F | Ht 60.0 in | Wt 114.0 lb

## 2023-10-12 DIAGNOSIS — I10 Essential (primary) hypertension: Secondary | ICD-10-CM | POA: Diagnosis not present

## 2023-10-12 DIAGNOSIS — F411 Generalized anxiety disorder: Secondary | ICD-10-CM | POA: Diagnosis not present

## 2023-10-12 DIAGNOSIS — G4700 Insomnia, unspecified: Secondary | ICD-10-CM | POA: Diagnosis not present

## 2023-10-12 MED ORDER — TRIAMCINOLONE ACETONIDE 0.1 % EX OINT: 1.0000 | TOPICAL_OINTMENT | Freq: Three times a day (TID) | CUTANEOUS | 2 refills | Status: DC

## 2023-10-12 NOTE — Assessment & Plan Note (Signed)
Elevated BP Reduce salt intake Take Norvasc 2.5 mg daily  SBP 140-150 at home

## 2023-10-12 NOTE — Assessment & Plan Note (Signed)
Gabapentin should help [name] to sleep better.  Stress reduction discussed.  She will continue with lorazepam.

## 2023-10-12 NOTE — Assessment & Plan Note (Signed)
Lorazepam prn, - tolerance has developed  Potential benefits of a long term benzodiazepines  use as well as potential risks  and complications were explained to the patient and were aknowledged. 

## 2023-10-12 NOTE — Patient Instructions (Signed)
Arnica gel for bruises

## 2023-10-12 NOTE — Progress Notes (Signed)
Subjective:  [name] ID: [name], female    DOB: Apr 18, 1936  Age: 87 y.o. MRN: [id number]  CC: Follow-up (3 month f/u /Pt wants to know if its ok to use A&D ointment for itching )   HPI [name] presents for [name], HTN, memory issues f/u  Outpatient Medications Prior to Visit  Medication Sig Dispense Refill   acetaminophen (TYLENOL) 500 MG tablet Take 1,000 mg by mouth every 6 (six) hours as needed for mild pain.     albuterol (VENTOLIN HFA) 108 (90 Base) MCG/ACT inhaler Take 2 puffs every 4-6 hours as needed     amLODipine (NORVASC) 2.5 MG tablet Take 2.5 mg by mouth daily.     Azelastine HCl 0.15 % SOLN Place into both nostrils. Place into both nostrils.     Calcium Carbonate-Vitamin D (CALCIUM CARBONATE W/VITAMIN D PO) Take 1 tablet by mouth daily.     cyanocobalamin (VITAMIN B12) 1000 MCG tablet Take 1 tablet (1,000 mcg total) by mouth daily. 100 tablet 3   desonide (DESOWEN) 0.05 % cream Apply 1 application topically as needed.     diclofenac Sodium (VOLTAREN) 1 % GEL APPLY 4 GRAMS 4 TIMES DAILY. 200 g 3   EPINEPHrine (EPIPEN 2-PAK) 0.3 mg/0.3 mL IJ SOAJ injection Inject 0.3 mLs (0.3 mg total) into the muscle as needed. 1 Device 1   erythromycin ophthalmic ointment Place 1 Application into both eyes at bedtime. 3.5 g 0   famotidine (PEPCID) 40 MG tablet Take 1 tablet (40 mg total) by mouth at bedtime. NEEDS OFFICE VISIT FOR ADDITIONAL REFILLS 30 tablet 2   Gauze Pads & Dressings (TELFA ADHESIVE DRESSING) 3"X4" PADS Use qd 20 each 0   hydrocortisone (ANUSOL-HC) 2.5 % rectal cream Place 1 Application rectally 2 (two) times daily. Insert per rectum 14 g 1   ipratropium (ATROVENT) 0.06 % nasal spray Place 1 spray into the nose 3 (three) times daily as needed for rhinitis. 15 mL 0   levocetirizine (XYZAL) 5 MG tablet Take 5 mg by mouth every evening.     levothyroxine (SYNTHROID) 50 MCG tablet TAKE ONE TABLET BY MOUTH DAILY BEFORE BREAKFAST 30 tablet 5   loratadine (CLARITIN) 10  MG tablet Take 1 tablet (10 mg total) by mouth daily. 100 tablet 3   LORazepam (ATIVAN) 1 MG tablet Take 2-3 tablets at bedtime and 1-2 tablets in the daytime as needed. 150 tablet 2   memantine (NAMENDA) 10 MG tablet Take 1 tablet (10 mg total) by mouth 2 (two) times daily. 60 tablet 11   montelukast (SINGULAIR) 10 MG tablet Take 1 tablet (10 mg total) by mouth at bedtime. 90 tablet 1   neomycin-polymyxin-hydrocortisone (CORTISPORIN) OTIC solution Place 3 drops into both ears 3 (three) times daily. 10 mL 0   oxymetazoline (AFRIN 12 HOUR) 0.05 % nasal spray Place 1 spray into both nostrils daily at 2 am.     pantoprazole (PROTONIX) 40 MG tablet Take 40 mg by mouth 2 (two) times daily.     polyethylene glycol powder (GLYCOLAX/MIRALAX) 17 GM/SCOOP powder Take 17 g by mouth 2 (two) times daily as needed for moderate constipation. 500 g 3   prednisoLONE acetate (PRED FORTE) 1 % ophthalmic suspension 1 drop 3 (three) times daily.     predniSONE (DELTASONE) 20 MG tablet Take 1 tablet (20 mg total) by mouth 2 (two) times daily. 10 tablet 0   psyllium (METAMUCIL SMOOTH TEXTURE) 58.6 % powder 1 scoop daily 283 g 6  simvastatin (ZOCOR) 40 MG tablet TAKE ONE TABLET ONCE DAILY 90 tablet 3   solifenacin (VESICARE) 10 MG tablet Take 1 tablet (10 mg total) by mouth daily as needed (bladder). 90 tablet 3   traMADol (ULTRAM) 50 MG tablet Take 1 tablet (50 mg total) by mouth every 6 (six) hours as needed for severe pain. 120 tablet 2   triamcinolone (NASACORT) 55 MCG/ACT AERO nasal inhaler Place 2 sprays into the nose as needed.     No facility-administered medications prior to visit.    ROS: Review of Systems  Constitutional:  Negative for activity change, appetite change, chills, fatigue and unexpected weight change.  HENT:  Negative for congestion, mouth sores and sinus pressure.   Eyes:  Negative for visual disturbance.  Respiratory:  Negative for cough and chest tightness.   Gastrointestinal:  Negative  for abdominal pain and nausea.  Genitourinary:  Negative for difficulty urinating, frequency and vaginal pain.  Musculoskeletal:  Positive for back pain. Negative for gait problem.  Skin:  Negative for pallor and rash.  Neurological:  Negative for dizziness, tremors, weakness, numbness and headaches.  Psychiatric/Behavioral:  Positive for decreased concentration. Negative for confusion and sleep disturbance.     Objective:  BP 128/88 (BP Location: Left Arm, [name] Position: Sitting, Cuff Size: Normal)   Pulse 70   Temp 97.8 F (36.6 C) (Oral)   Ht 5' (1.524 m)   Wt 114 lb (51.7 kg)   SpO2 91%   BMI 22.26 kg/m   BP Readings from Last 3 Encounters:  10/12/23 128/88  09/07/23 125/78  08/25/23 132/80    Wt Readings from Last 3 Encounters:  10/12/23 114 lb (51.7 kg)  09/07/23 115 lb (52.2 kg)  08/25/23 116 lb (52.6 kg)    Physical Exam Constitutional:      General: [name] is not in acute distress.    Appearance: Normal appearance. [name] is well-developed.  HENT:     Head: Normocephalic.     Right Ear: External ear normal.     Left Ear: External ear normal.     Nose: Nose normal.  Eyes:     General:        Right eye: No discharge.        Left eye: No discharge.     Conjunctiva/sclera: Conjunctivae normal.     Pupils: Pupils are equal, round, and reactive to light.  Neck:     Thyroid: No thyromegaly.     Vascular: No JVD.     Trachea: No tracheal deviation.  Cardiovascular:     Rate and Rhythm: Normal rate and regular rhythm.     Heart sounds: Normal heart sounds.  Pulmonary:     Effort: No respiratory distress.     Breath sounds: No stridor. No wheezing.  Abdominal:     General: Bowel sounds are normal. There is no distension.     Palpations: Abdomen is soft. There is no mass.     Tenderness: There is no abdominal tenderness. There is no guarding or rebound.  Musculoskeletal:        General: No tenderness.     Cervical back: Normal range of motion and neck supple.  No rigidity.  Lymphadenopathy:     Cervical: No cervical adenopathy.  Skin:    Findings: No erythema or rash.  Neurological:     Mental Status: Mental status is at baseline.     Cranial Nerves: No cranial nerve deficit.     Motor: No abnormal muscle tone.  Coordination: Coordination normal.     Deep Tendon Reflexes: Reflexes normal.  Psychiatric:        Behavior: Behavior normal.        Thought Content: Thought content normal.        Judgment: Judgment normal.     Lab Results  Component Value Date   WBC 7.5 10/05/2023   HGB 12.8 10/05/2023   HCT 39.6 10/05/2023   PLT 267.0 10/05/2023   GLUCOSE 94 10/05/2023   CHOL 184 09/12/2023   TRIG 139.0 09/12/2023   HDL 59.90 09/12/2023   LDLDIRECT 98.0 08/13/2021   LDLCALC 96 09/12/2023   ALT 23 10/05/2023   AST 22 10/05/2023   NA 134 (L) 10/05/2023   K 4.0 10/05/2023   CL 101 10/05/2023   CREATININE 0.80 10/05/2023   BUN 15 10/05/2023   CO2 25 10/05/2023   TSH 1.70 09/12/2023   HGBA1C 5.5 06/13/2020    ECHOCARDIOGRAM COMPLETE  Result Date: 02/11/2023    ECHOCARDIOGRAM REPORT   [name] Name:   JULIET MOHAN Date of Exam: 02/11/2023 Medical Rec #:  [id number]     Height:       60.5 in Accession #:    [id number]    Weight:       116.0 lb Date of Birth:  [birth year]      BSA:          1.490 m [name] Age:    86 years      BP:           133/76 mmHg [name] Gender: F             HR:           73 bpm. Exam Location:  Outpatient Procedure: 2D Echo, Color Doppler and Cardiac Doppler Indications:    I50.9* Heart failure (unspecified)  History:        [name] has prior history of Echocardiogram examinations, most                 recent 06/25/2022. Risk Factors:Dyslipidemia.  Sonographer:    Irving Burton Senior RDCS Referring Phys: [id number] DANIEL R BENSIMHON IMPRESSIONS  1. Left ventricular ejection fraction, by estimation, is 55 to 60%. The left ventricle has normal function. The left ventricle has no regional wall motion abnormalities. Left ventricular  diastolic parameters are consistent with Grade I diastolic dysfunction (impaired relaxation).  2. Right ventricular systolic function is normal. The right ventricular size is normal. There is normal pulmonary artery systolic pressure.  3. The mitral valve is normal in structure. Mild mitral valve regurgitation. No evidence of mitral stenosis.  4. The aortic valve is tricuspid. There is mild calcification of the aortic valve. Aortic valve regurgitation is mild. Aortic valve sclerosis/calcification is present, without any evidence of aortic stenosis. Aortic regurgitation PHT measures 436 msec. Aortic valve area, by VTI measures 1.81 cm. Aortic valve mean gradient measures 5.0 mmHg. Aortic valve Vmax measures 1.45 m/s.  5. The inferior vena cava is normal in size with greater than 50% respiratory variability, suggesting right atrial pressure of 3 mmHg. FINDINGS  Left Ventricle: Left ventricular ejection fraction, by estimation, is 55 to 60%. The left ventricle has normal function. The left ventricle has no regional wall motion abnormalities. The left ventricular internal cavity size was normal in size. There is  no left ventricular hypertrophy. Left ventricular diastolic parameters are consistent with Grade I diastolic dysfunction (impaired relaxation). Right Ventricle: The right ventricular size is normal. No increase in right ventricular  wall thickness. Right ventricular systolic function is normal. There is normal pulmonary artery systolic pressure. The tricuspid regurgitant velocity is 2.35 m/s, and  with an assumed right atrial pressure of 3 mmHg, the estimated right ventricular systolic pressure is 25.1 mmHg. Left Atrium: Left atrial size was normal in size. Right Atrium: Right atrial size was normal in size. Pericardium: There is no evidence of pericardial effusion. Mitral Valve: The mitral valve is normal in structure. Mild mitral valve regurgitation. No evidence of mitral valve stenosis. Tricuspid Valve: The  tricuspid valve is normal in structure. Tricuspid valve regurgitation is not demonstrated. No evidence of tricuspid stenosis. Aortic Valve: The aortic valve is tricuspid. There is mild calcification of the aortic valve. Aortic valve regurgitation is mild. Aortic regurgitation PHT measures 436 msec. Aortic valve sclerosis/calcification is present, without any evidence of aortic stenosis. Aortic valve mean gradient measures 5.0 mmHg. Aortic valve peak gradient measures 8.4 mmHg. Aortic valve area, by VTI measures 1.81 cm. Pulmonic Valve: The pulmonic valve was normal in structure. Pulmonic valve regurgitation is trivial. No evidence of pulmonic stenosis. Aorta: The aortic root is normal in size and structure. Venous: The inferior vena cava is normal in size with greater than 50% respiratory variability, suggesting right atrial pressure of 3 mmHg. IAS/Shunts: No atrial level shunt detected by color flow Doppler.  LEFT VENTRICLE PLAX 2D LVIDd:         4.00 cm   Diastology LVIDs:         2.90 cm   LV e' medial:    5.44 cm/s LV PW:         0.80 cm   LV E/e' medial:  8.4 LV IVS:        0.70 cm   LV e' lateral:   4.90 cm/s LVOT diam:     1.95 cm   LV E/e' lateral: 9.3 LV SV:         51 LV SV Index:   34 LVOT Area:     2.99 cm  RIGHT VENTRICLE RV S prime:     13.50 cm/s TAPSE (M-mode): 1.6 cm LEFT ATRIUM             Index        RIGHT ATRIUM           Index LA diam:        2.60 cm 1.74 cm/m   RA Area:     13.40 cm LA Vol (A2C):   43.7 ml 29.33 ml/m  RA Volume:   30.70 ml  20.60 ml/m LA Vol (A4C):   25.6 ml 17.18 ml/m LA Biplane Vol: 33.1 ml 22.21 ml/m  AORTIC VALVE AV Area (Vmax):    1.78 cm AV Area (Vmean):   1.81 cm AV Area (VTI):     1.81 cm AV Vmax:           145.00 cm/s AV Vmean:          101.000 cm/s AV VTI:            0.282 m AV Peak Grad:      8.4 mmHg AV Mean Grad:      5.0 mmHg LVOT Vmax:         86.60 cm/s LVOT Vmean:        61.200 cm/s LVOT VTI:          0.171 m LVOT/AV VTI ratio: 0.61 AI PHT:             436 msec  AORTA Ao Root diam: 2.90 cm Ao Asc diam:  2.90 cm MITRAL VALVE                  TRICUSPID VALVE MV Area (PHT): 3.48 cm       TR Peak grad:   22.1 mmHg MV Decel Time: 218 msec       TR Vmax:        235.00 cm/s MR Peak grad:    114.5 mmHg MR Mean grad:    64.0 mmHg    SHUNTS MR Vmax:         535.00 cm/s  Systemic VTI:  0.17 m MR Vmean:        360.0 cm/s   Systemic Diam: 1.95 cm MR PISA:         1.01 cm MR PISA Eff ROA: 7 mm MR PISA Radius:  0.40 cm MV E velocity: 45.50 cm/s MV A velocity: 85.10 cm/s MV E/A ratio:  0.53 Arvilla Meres MD Electronically signed by Arvilla Meres MD Signature Date/Time: 02/11/2023/2:06:19 PM    Final     Assessment & Plan:   Problem List Items Addressed This Visit     Essential hypertension, benign    Elevated BP Reduce salt intake Take Norvasc 2.5 mg daily  SBP 140-150 at home      Insomnia - Primary    Gabapentin should help [name] to sleep better.  Stress reduction discussed.  [name] will continue with lorazepam.      Generalized anxiety disorder    Lorazepam prn, - tolerance has developed  Potential benefits of a long term benzodiazepines  use as well as potential risks  and complications were explained to the [name] and were aknowledged.         Meds ordered this encounter  Medications   triamcinolone ointment (KENALOG) 0.1 %    Sig: Apply 1 Application topically 3 (three) times daily. As needed for itching    Dispense:  80 g    Refill:  2      Follow-up: Return in about 3 months (around 01/12/2024) for a follow-up visit.  Sonda Primes, MD

## 2023-10-17 DIAGNOSIS — H0015 Chalazion left lower eyelid: Secondary | ICD-10-CM | POA: Diagnosis not present

## 2023-10-17 DIAGNOSIS — M5416 Radiculopathy, lumbar region: Secondary | ICD-10-CM | POA: Diagnosis not present

## 2023-10-26 ENCOUNTER — Ambulatory Visit: Payer: Medicare Other | Admitting: Physician Assistant

## 2023-10-31 ENCOUNTER — Ambulatory Visit
Admission: RE | Admit: 2023-10-31 | Discharge: 2023-10-31 | Disposition: A | Discharge status: Home / Self Care | Payer: Medicare Other | Source: Ambulatory Visit | Attending: Physician Assistant | Admitting: Physician Assistant

## 2023-10-31 DIAGNOSIS — I6782 Cerebral ischemia: Secondary | ICD-10-CM | POA: Diagnosis not present

## 2023-10-31 DIAGNOSIS — D320 Benign neoplasm of cerebral meninges: Secondary | ICD-10-CM | POA: Diagnosis not present

## 2023-10-31 DIAGNOSIS — G319 Degenerative disease of nervous system, unspecified: Secondary | ICD-10-CM | POA: Diagnosis not present

## 2023-10-31 MED ORDER — GADOPICLENOL 0.5 MMOL/ML IV SOLN
5.0000 mL | Freq: Once | INTRAVENOUS | Status: AC | PRN
Start: 2023-10-31 — End: 2023-10-31
  Administered 2023-10-31: 5 mL via INTRAVENOUS

## 2023-11-02 ENCOUNTER — Telehealth: Payer: Self-pay | Admitting: Pharmacy Technician

## 2023-11-02 ENCOUNTER — Other Ambulatory Visit (HOSPITAL_COMMUNITY): Payer: Self-pay

## 2023-11-02 NOTE — Telephone Encounter (Signed)
Pharmacy Patient Advocate Encounter   Received notification from CoverMyMeds that prior authorization for Memantine HCl 10MG  tablets  is required/requested.   Insurance verification completed.   The patient is insured through [facility] Medicare Part D .   Per test claim: PA required; PA started via CoverMyMeds. KEY [id number] . Please see clinical question(s) below that I am not finding the answer to in her chart and advise.  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

## 2023-11-03 NOTE — Telephone Encounter (Signed)
 Clinical questions have been answered and PA submitted. PA currently Pending.

## 2023-11-08 ENCOUNTER — Telehealth: Payer: Self-pay | Admitting: Physician Assistant

## 2023-11-08 NOTE — Telephone Encounter (Signed)
Hasn't been read yet, will call reading room [phone]

## 2023-11-08 NOTE — Telephone Encounter (Signed)
Pt calling about MRI results. Very anxious to know

## 2023-11-08 NOTE — Telephone Encounter (Signed)
I left detailed message ,that results are not available. I did request MRI to be read.

## 2023-11-08 NOTE — Progress Notes (Deleted)
   Procedure Note  Patient: [name]             Date of Birth: 06-21-1936           MRN: [id number]             Visit Date: 11/15/2023  Procedures: Visit Diagnoses:  1. Piriformis syndrome of both sides     No procedures performed

## 2023-11-14 ENCOUNTER — Other Ambulatory Visit (HOSPITAL_COMMUNITY): Payer: Self-pay

## 2023-11-14 NOTE — Telephone Encounter (Signed)
Pharmacy Patient Advocate Encounter  Received notification from [facility] Medicare that Prior Authorization for Memantine HCl 10MG  tablets has been APPROVED from 11/03/23 to 11/02/24. Ran test claim, Copay is $4.47. This test claim was processed through [facility]- copay amounts may vary at other pharmacies due to pharmacy/plan contracts, or as the patient moves through the different stages of their insurance plan.   PA #/Case ID/Reference #: PA Case ID #: [id number]

## 2023-11-15 ENCOUNTER — Encounter: Payer: Self-pay | Admitting: Rheumatology

## 2023-11-15 ENCOUNTER — Ambulatory Visit: Payer: Medicare Other | Attending: Sports Medicine | Admitting: Rheumatology

## 2023-11-15 VITALS — BP 134/81 | HR 85 | Resp 13 | Ht 60.0 in | Wt 115.0 lb

## 2023-11-15 DIAGNOSIS — M62830 Muscle spasm of back: Secondary | ICD-10-CM | POA: Diagnosis not present

## 2023-11-15 DIAGNOSIS — M19041 Primary osteoarthritis, right hand: Secondary | ICD-10-CM | POA: Diagnosis not present

## 2023-11-15 DIAGNOSIS — G5703 Lesion of sciatic nerve, bilateral lower limbs: Secondary | ICD-10-CM | POA: Insufficient documentation

## 2023-11-15 DIAGNOSIS — M7918 Myalgia, other site: Secondary | ICD-10-CM | POA: Diagnosis present

## 2023-11-15 DIAGNOSIS — M533 Sacrococcygeal disorders, not elsewhere classified: Secondary | ICD-10-CM | POA: Diagnosis not present

## 2023-11-15 DIAGNOSIS — M5030 Other cervical disc degeneration, unspecified cervical region: Secondary | ICD-10-CM | POA: Insufficient documentation

## 2023-11-15 DIAGNOSIS — G8929 Other chronic pain: Secondary | ICD-10-CM | POA: Insufficient documentation

## 2023-11-15 DIAGNOSIS — F4321 Adjustment disorder with depressed mood: Secondary | ICD-10-CM | POA: Insufficient documentation

## 2023-11-15 DIAGNOSIS — I10 Essential (primary) hypertension: Secondary | ICD-10-CM | POA: Insufficient documentation

## 2023-11-15 DIAGNOSIS — E034 Atrophy of thyroid (acquired): Secondary | ICD-10-CM | POA: Diagnosis not present

## 2023-11-15 DIAGNOSIS — M19071 Primary osteoarthritis, right ankle and foot: Secondary | ICD-10-CM | POA: Diagnosis not present

## 2023-11-15 DIAGNOSIS — R2681 Unsteadiness on feet: Secondary | ICD-10-CM | POA: Diagnosis not present

## 2023-11-15 DIAGNOSIS — Z8719 Personal history of other diseases of the digestive system: Secondary | ICD-10-CM | POA: Insufficient documentation

## 2023-11-15 DIAGNOSIS — M19072 Primary osteoarthritis, left ankle and foot: Secondary | ICD-10-CM | POA: Diagnosis not present

## 2023-11-15 DIAGNOSIS — Z8639 Personal history of other endocrine, nutritional and metabolic disease: Secondary | ICD-10-CM | POA: Insufficient documentation

## 2023-11-15 DIAGNOSIS — M51369 Other intervertebral disc degeneration, lumbar region without mention of lumbar back pain or lower extremity pain: Secondary | ICD-10-CM | POA: Diagnosis not present

## 2023-11-15 DIAGNOSIS — M810 Age-related osteoporosis without current pathological fracture: Secondary | ICD-10-CM | POA: Diagnosis not present

## 2023-11-15 DIAGNOSIS — M19042 Primary osteoarthritis, left hand: Secondary | ICD-10-CM | POA: Diagnosis not present

## 2023-11-15 MED ORDER — LIDOCAINE HCL 1 % IJ SOLN
0.5000 mL | INTRAMUSCULAR | Status: AC | PRN
  Administered 2023-11-15: .5 mL

## 2023-11-15 MED ORDER — TRIAMCINOLONE ACETONIDE 40 MG/ML IJ SUSP
20.0000 mg | INTRAMUSCULAR | Status: AC | PRN
  Administered 2023-11-15: 20 mg via INTRAMUSCULAR

## 2023-11-15 NOTE — Progress Notes (Signed)
Office Visit Note  Patient: [name]             Date of Birth: [birth year]           MRN: [id number]             PCP: [name], MD Referring: [name], MD Visit Date: 11/15/2023 Occupation: @GUAROCC @  Subjective:  Pain in multiple joints  History of Present Illness: [name] is a 87 y.o. female with osteoarthritis, degenerative disc disease and osteoporosis.  She states that she has been having increased pain and discomfort with the weather change.  She had an epidural injection by Dr. Lorrine Kin in June which was very helpful.  She states that she went to see him again 4 weeks ago and had an injection in the gluteal region at that visit.  She states she felt better after the shot but gradually the pain is coming back.  She is having more discomfort in the neck, right shoulder and in the gluteal region.  She has been taking Tylenol for pain relief.  He also uses Voltaren gel.  And you been exercising    Activities of Daily Living:  Patient reports morning stiffness for 0 minutes.   Patient Reports nocturnal pain.  Difficulty dressing/grooming: Denies Difficulty climbing stairs: Denies Difficulty getting out of chair: Denies Difficulty using hands for taps, buttons, cutlery, and/or writing: Denies  Review of Systems  Constitutional:  Positive for fatigue.  HENT:  Positive for mouth dryness. Negative for mouth sores.   Eyes:  Negative for dryness.  Respiratory:  Negative for shortness of breath.   Cardiovascular:  Negative for chest pain and palpitations.  Gastrointestinal:  Negative for blood in stool, constipation and diarrhea.  Endocrine: Positive for increased urination.  Genitourinary:  Negative for involuntary urination.  Musculoskeletal:  Positive for gait problem, myalgias, muscle tenderness and myalgias. Negative for joint pain, joint pain, joint swelling, muscle weakness and morning stiffness.  Skin:  Negative for color change, rash, hair  loss and sensitivity to sunlight.  Allergic/Immunologic: Negative for susceptible to infections.  Neurological:  Positive for light-headedness. Negative for dizziness and headaches.  Hematological:  Negative for swollen glands.  Psychiatric/Behavioral:  Negative for depressed mood and sleep disturbance. The patient is not nervous/anxious.     PMFS History:  Patient Active Problem List   Diagnosis Date Noted   Mild cognitive impairment with memory loss 09/15/2023   CHF (congestive heart failure)    Hypotension 07/13/2023   Skin ulcer of nose 07/13/2023   Chalazion left upper eyelid 04/18/2023   Pain of right sacroiliac joint 01/14/2023   Lumbar degenerative disc disease 12/21/2022   Chronic left SI joint pain 11/30/2022   Inflammation of sacroiliac joint 11/16/2022   Stool incontinence 11/16/2022   Grief 10/06/2022   History of basal cell carcinoma 09/29/2022   Stress at home 07/21/2022   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   Gait disorder 02/28/2022   Melanocytic nevi of trunk 02/22/2022   Seborrheic dermatitis 02/22/2022   Urticaria 02/22/2022   Concussion 10/29/2021   Allergic rhinitis due to animal (cat) (dog) hair and dander 08/07/2021   Allergic rhinitis due to pollen 08/07/2021   Mild intermittent asthma 08/07/2021   Cervical spine instability 05/15/2021   Cervical spinal cord compression 05/04/2021   Degenerative disc disease, cervical 03/11/2021   Spondylolisthesis, cervical region 03/11/2021   Trochanteric bursitis of right hip 09/22/2020   Meningioma 03/25/2020   Cholelithiasis 09/09/2019   Cervical spondylosis 01/05/2019  Generalized anxiety disorder 08/22/2018   Contact dermatitis and eczema 06/14/2018   Constipation 11/11/2016   Sinusitis, chronic 08/06/2016   Aortic valve sclerosis 03/12/2015   Fatigue 09/08/2014   Increased frequency of urination 08/19/2014   Urinary urgency 08/19/2014   Postherpetic neuralgia 10/02/2013   Cerumen impaction  07/16/2013   Insomnia 01/24/2013   Pruritus of skin 03/11/2012   MVP (mitral valve prolapse)    Multiple thyroid nodules 10/04/2011   Herpes zoster 09/07/2011   Essential hypertension, benign 07/27/2011   Atrophic vaginitis    Hyperlipemia, mixed 03/15/2011   OAB (overactive bladder) 09/02/2010   Hypothyroidism 03/03/2010   Irritable bowel syndrome 09/04/2008   History of colonic polyps 09/04/2008   Atrophic gastritis 09/03/2008   Duodenitis 09/03/2008   Tibialis tendinitis 03/20/2008   Osteoarthritis 02/25/2008   Lactose intolerance 11/20/2007   GERD (gastroesophageal reflux disease) 11/20/2007   Osteoporosis 11/20/2007    Past Medical History:  Diagnosis Date   Allergic rhinitis due to animal (cat) (dog) hair and dander 08/07/2021   Allergic rhinitis due to pollen 08/07/2021   Aortic valve sclerosis 03/12/2015   Atrophic gastritis 09/03/2008   Atrophic vaginitis    Baker's cyst    Left-Dr. Lequita Halt   Bladder pain 08/19/2014   Chronic  Take Vesicare 10 mg/d  UA was ok  On Premarin cream PV per GYN  On Vesicare  F/u w/Dr Monico Blitz     Cataract    Dr. Dione Booze   Cervical spinal cord compression 05/04/2021   Cervical spine instability 05/15/2021   Dr. Venetia Maxon: plan -  posterior C1-2 decompression and instrumented fusion.  MRI: Progressive soft tissue pannus at C1-2 is now creating mass  effect on the craniocervical junction with distortion of the upper  spinal cord. This is likely related rheumatoid arthritis  C spine CT: Fracture of the anterior arch of C1 with 12 mm separation. Nondisplaced fracture posterior arch of C1. There is later   Cervical spondylosis 01/05/2019   Chalazion left upper eyelid 04/18/2023   X2  Keflex po - too big... Will use Ceftin  F/u w/Ophthalmology   CHF (congestive heart failure)    Cholelithiasis 09/09/2019   Per Dr Marina Goodell: "At this point we mutually decided to watch her abdominal complaints.  If symptoms accelerate or become more classic for symptomatic  cholelithiasis, then she is agreeable to surgical referral.  She will keep me posted"   Chronic left SI joint pain 11/30/2022   Closed fracture of first cervical vertebra 08/07/2021   Closed fracture of second cervical vertebra 08/07/2021   Concussion 10/29/2021   s/p a hard fall after she rushed and slipped on the wet marble floor while visiting family in [place] a couple week ago.  She hit the side of her skull, had a skin laceration bled quite a bit.  She was dazed and according to her son Lorin Picket had a loss of consciousness of under a minute duration.  She felt dazed.  She was taken to ER.  2 staples were applied to her skin laceration on the left o   Constipation 11/11/2016   Chronic, multifactorial  Miralax prn  7/22 Postop constipation related to oxycodone and Robaxin.  We can discontinue oxycodone Robaxin.  The patient will use tramadol and Tylenol instead.  Use MiraLAX or Senokot as to produce soft regular stools.  Discontinue Pepto-Bismol.  I think this should help with abdominal bloating complaint.      Take Metamucil daily  Senokot as to produce soft regular  sto   Contact dermatitis and eczema 06/14/2018   Eczema vs other - 7/19  Cortaid prn  Depo-medrol IM 80 mg  Remove nail polish     Contusion of left chest wall 01/09/2015   Derinda fell and broke a rib #8 on the L last week, she had a rib X ray and a CT (Dr Thurston Hole).   Contusion of right knee 08/12/2016   R knee   Degenerative disc disease, cervical 03/11/2021   Duodenitis 09/03/2008   Essential hypertension, benign 07/27/2011   Chronic, mild  NAS diet  Cardiac CT scan for calcium scoring offered 1/20  1/24 HTN and s/p ER visit on 12/08/22 for SBP 180. Per Dr Lynelle Doctor: " Discussed with the patient that her restarting donepezil could have impacted her symptoms today.  However, her blood pressure continues to be mildly elevated.  Will place her on 2.5 mg amlodipine to take if her blood pressure is over 170/100.  Nursing aide at   Fatigue  09/08/2014   10/15, 2/16, 9/17, 2/18  No other sx  Daela is managing it with short rest periods 1-2/d     Fibroid    Gait disorder 02/28/2022   Worse  Multifactorial  Cont w/PT  In the balance class  Balance class q 1 week     [name] resolved after doing exercises by Dr Darrick Penna 11/2021     Generalized anxiety disorder 08/22/2018   Chronic   Lexapro - not taking  Weighted blanket  Lorazepam prn, - tolerance has developed   Potential benefits of a long term benzodiazepines  use as well as potential risks  and complications were explained to the patient and were aknowledged.     GERD (gastroesophageal reflux disease) 11/20/2007   Chronic. Better on Gluten free diet  Dexilant   Potential benefits of a long term PPI  use as well as potential risks  and complications were explained to the patient and were aknowledged.  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     History of basal cell carcinoma 09/29/2022   History of colonic polyps 09/04/2008   Hyperlipemia, mixed 03/15/2011   Chronic  On Simvastatin     Hypotension 07/13/2023   Stop Amlodipine     Hypothyroidism    Impacted cerumen of right ear 07/16/2013   Increased frequency of urination 08/19/2014   Inflammation of sacroiliac joint 11/16/2022   Insomnia 01/24/2013   Lorazepam prn   Potential benefits of a long term benzodiazepines  use as well as potential risks  and complications were explained to the patient and were aknowledged.        Irritable bowel syndrome 09/04/2008   Chronic   Marina Goodell MD, Wilhemina Bonito  2013 - much better on gluten free diet              Lactose intolerance 11/20/2007   Lactaid        Lumbar degenerative disc disease 12/21/2022   Based on MRI and her exam there is significant DDD in lumbar spine     Lumbar spondylosis    Melanocytic nevi of  trunk 02/22/2022   Meningioma 03/25/2020   Dr Venetia Maxon     Mild cognitive impairment with  memory loss 09/15/2023   Mild intermittent asthma 08/07/2021   Multiple thyroid nodules 10/04/2011   2013 she was started on Synthroid - tol well  2017, 2022 [place]     MVP (mitral valve prolapse)    Antibiotics required for dental procedures   Neck pain 12/26/2018   Chronic  PT, traction, TENs unit was offered  Tramadol prn   Potential benefits of a long term opioids use as well as potential risks (i.e. addiction risk, apnea etc) and complications (i.e. Somnolence, constipation and others) were explained to the patient and were aknowledged.        OAB (overactive bladder) 09/02/2010   Dr Earlene Plater  Use Solifenacin prn  Worse - ?multifactorial  UA was normal  Huntley Dec increased Memantine to 10 mg bid a month ago     Osteoarthritis 02/25/2008   Dr Ethelene Hal  Blue-Emu cream was recommended to use 2-3 times a day        Osteoporosis 11/20/2007   Chronic  Per GYN        Overactive bladder    Pain of right sacroiliac joint 01/14/2023   Postherpetic neuralgia 10/02/2013   Pruritus of skin 03/11/2012   S/p H zoster vaccination  Relapsing  8/12 - on face  8/14 - R ear/face  10/14 ?     Sacroiliitis    Seborrheic dermatitis 02/22/2022   Sinusitis, chronic 08/06/2016   Worse  Flonase, Atrovent nasal, Singulair, Claritin  ENT ref Dr Dorma Russell  CT sinuses     Skin ulcer of nose 07/13/2023   Soft pads on the nose  Derm appt     Spondylolisthesis, cervical region 03/11/2021   Stool incontinence 11/16/2022   12/23  Worse.  Stool leakage - new  D/c Aricept  Abd X ray to rule out constipation     Tibialis tendinitis 03/20/2008   Torn meniscus    bilateral   Trochanteric bursitis of right hip 09/22/2020   Upper respiratory infection 08/03/2012   9/13 - 3 wks  2/17  11/18 - asthmatic bronchitis     Urinary urgency 08/19/2014   2023 worse.  Obtain urinalysis.  D/c Aricept  UA was ok  On Premarin cream PV per GYN  On Vesicare  F/u  w/Dr Monico Blitz     Urticaria 02/22/2022    Family History  Problem Relation Age of Onset   Heart disease Mother    Hypertension Mother    Osteoporosis Mother    Congestive Heart Failure Mother    Pancreatic cancer Father    Heart attack Brother    Drug abuse Brother        over dose    Healthy Son    Healthy Son    Colon cancer Neg Hx    Colon polyps Neg Hx    Rectal cancer Neg Hx    Stomach cancer Neg Hx    Past Surgical History:  Procedure Laterality Date   CATARACT EXTRACTION, BILATERAL     EYE SURGERY     POSTERIOR CERVICAL FUSION/FORAMINOTOMY N/A 05/15/2021   Procedure: Cervical One Laminectomy with resection of cyst, Fixation from Occiput to Cervical Four;  Surgeon: [name], MD;  Location: [facility] OR;  Service: Neurosurgery;  Laterality: N/A;   TONSILLECTOMY     Social History   Social History Narrative   Regular exercise-yesDaily caffeine use   Right handed   Lives alone   retired   Immunization History  Administered Date(s) Administered   Fluad Quad(high Dose 65+) 08/04/2019,  09/16/2021, 09/12/2022   Influenza Whole 09/29/2007, 08/21/2008, 09/02/2010, 07/30/2012   Influenza, High Dose Seasonal PF 09/03/2013, 02/19/2015, 11/12/2015, 08/06/2016, 11/11/2016, 08/20/2017, 08/31/2017, 09/20/2018, 09/12/2019   Influenza,inj,Quad PF,6+ Mos 12/17/2014, 07/30/2015   PFIZER(Purple Top)SARS-COV-2 Vaccination 12/12/2019, 12/31/2019, 07/29/2020, 07/29/2020, 07/31/2020, 09/10/2020   Pfizer Covid-19 Vaccine Bivalent Booster 31yrs & up 08/21/2021   Pfizer(Comirnaty)Fall Seasonal Vaccine 12 years and older 09/29/2022   Pneumococcal Conjugate-13 01/09/2014   Pneumococcal Polysaccharide-23 09/26/2006, 08/12/2015, 11/11/2016, 08/31/2017, 09/10/2020, 09/10/2021   Td 04/14/2010   Tdap 06/18/2020   Typhoid Inactivated 02/23/2013   Zoster, Live 12/09/2006     Objective: Vital Signs: BP 134/81 (BP Location: Left Arm, Patient Position: Sitting, Cuff Size: Normal)   Pulse 85   Resp  13   Ht 5' (1.524 m)   Wt 115 lb (52.2 kg)   BMI 22.46 kg/m    Physical Exam Vitals and nursing note reviewed.  Constitutional:      Appearance: She is well-developed.  HENT:     Head: Normocephalic and atraumatic.  Eyes:     Conjunctiva/sclera: Conjunctivae normal.  Cardiovascular:     Rate and Rhythm: Normal rate and regular rhythm.     Heart sounds: Normal heart sounds.  Pulmonary:     Effort: Pulmonary effort is normal.     Breath sounds: Normal breath sounds.  Abdominal:     General: Bowel sounds are normal.     Palpations: Abdomen is soft.  Musculoskeletal:     Cervical back: Normal range of motion.  Lymphadenopathy:     Cervical: No cervical adenopathy.  Skin:    General: Skin is warm and dry.     Capillary Refill: Capillary refill takes less than 2 seconds.  Neurological:     Mental Status: She is alert and oriented to person, place, and time.  Psychiatric:        Behavior: Behavior normal.      Musculoskeletal Exam: Patient had very limited range of motion of the cervical spine.  She had limited range of motion of thoracic and lumbar spine.  She had bilateral trapezius spasm and discomfort.  She had good range of motion of bilateral shoulders.  Elbow joints, wrist joints were in good range of motion.  She had bilateral CMC PIP and DIP thickening with no synovitis.  Hip joints were in good range of motion.  She had no discomfort with range of motion of her knee joints without any warmth swelling or effusion.  There was no tenderness over ankles or MTPs.  CDAI Exam: CDAI Score: -- Patient Global: --; Provider Global: -- Swollen: --; Tender: -- Joint Exam 11/15/2023   No joint exam has been documented for this visit   There is currently no information documented on the homunculus. Go to the Rheumatology activity and complete the homunculus joint exam.  Investigation: No additional findings.  Imaging: MR BRAIN W WO CONTRAST Result Date: 11/08/2023 CLINICAL  DATA:  Provided history: Meningioma. EXAM: MRI HEAD WITHOUT AND WITH CONTRAST TECHNIQUE: Multiplanar, multiecho pulse sequences of the brain and surrounding structures were obtained without and with intravenous contrast. CONTRAST:  5 mL Vueway intravenous contrast. COMPARISON:  Brain MRI 12/04/2022. FINDINGS: Brain: Mild generalized cerebral atrophy. 17 x 16 x 11 mm enhancing and partially calcified dural-based mass overlying the posterior left frontal lobe, unchanged in size as compared to prior brain MRI of 12/04/2022. As before, the mass indents the underlying brain parenchyma without underlying parenchymal edema. Multifocal T2 FLAIR hyperintense signal abnormality within the cerebral white matter,  nonspecific but compatible with mild chronic small vessel ischemic disease. No cortical encephalomalacia is identified. There is no acute infarct. No extra-axial fluid collection. No midline shift. Vascular: Maintained flow voids within the proximal large arterial vessels. Small left frontoparietal developmental venous anomaly medially (anatomic variant). Skull and upper cervical spine: No focal worrisome marrow lesion. Susceptibility artifact arising from craniocervical fusion hardware. Incompletely assessed cervical spondylosis. Ligamentous hypertrophy along the posterior aspect of the dens. Sinuses/Orbits: No mass or acute finding within the imaged orbits. Prior bilateral ocular lens replacement. Small-volume secretions within the right sphenoid sinus. IMPRESSION: 1. 17 x 16 mm partially calcified meningioma overlying the posterior left frontal lobe, unchanged in size from the brain MRI of 12/04/2022. As before, the mass indents the underlying brain parenchyma without underlying parenchymal edema. 2. Mild chronic small vessel ischemic changes within the cerebral white matter, similar to the prior MRI. 3. Mild generalized cerebral atrophy. 4. Mild right sphenoid sinusitis. Electronically Signed   By: Jackey Loge  D.O.   On: 11/08/2023 11:14    Recent Labs: Lab Results  Component Value Date   WBC 7.5 10/05/2023   HGB 12.8 10/05/2023   PLT 267.0 10/05/2023   NA 134 (L) 10/05/2023   K 4.0 10/05/2023   CL 101 10/05/2023   CO2 25 10/05/2023   GLUCOSE 94 10/05/2023   BUN 15 10/05/2023   CREATININE 0.80 10/05/2023   BILITOT 0.5 10/05/2023   ALKPHOS 40 10/05/2023   AST 22 10/05/2023   ALT 23 10/05/2023   PROT 7.4 10/05/2023   ALBUMIN 4.2 10/05/2023   CALCIUM 9.1 10/05/2023   GFRAA 87 07/17/2020    Speciality Comments: No specialty comments available.  Procedures:  Trigger Point Inj  Date/Time: 11/15/2023 3:28 PM  Performed by: Pollyann Savoy, MD Authorized by: Pollyann Savoy, MD   Consent Given by:  Patient Site marked: the procedure site was marked   Timeout: prior to procedure the correct patient, procedure, and site was verified   Indications:  Muscle spasm and pain Total # of Trigger Points:  2 Location: neck   Needle Size:  27 G Approach:  Dorsal Medications #1:  0.5 mL lidocaine 1 %; 20 mg triamcinolone acetonide 40 MG/ML Medications #2:  0.5 mL lidocaine 1 %; 20 mg triamcinolone acetonide 40 MG/ML Patient tolerance:  Patient tolerated the procedure well with no immediate complications  Allergies: Bee venom, Bacitracin-polymyxin b, Clarithromycin, Doxycycline, Neosporin [neomycin-bacitracin zn-polymyx], and Hydrocortisone   Assessment / Plan:     Visit Diagnoses: Primary osteoarthritis of both hands-she continues to have pain and discomfort in bilateral hands.  Bilateral CMC PIP and DIP thickening with no synovitis was noted.  Joint protection muscle strengthening was discussed.  Primary osteoarthritis of both feet-proper fitting shoes were advised.  Spasm of both trapezius muscles -she had bilateral trapezius spasm and discomfort.  Patient had good response to previous trigger point injections.  She requested repeat trigger point injections.  After informed consent  was obtained bilateral trapezius region was injected with lidocaine and Kenalog.  Patient tolerated the procedure well.  Postprocedure instructions were given.  DDD (degenerative disc disease), cervical - She had fusion in the past by Dr. Venetia Maxon.  She is followed by Dr. Jake Samples.  She continues to have very limited range of motion.  Piriformis syndrome of both sides-she has been experiencing discomfort in the piriformis region.  She had injection by Dr. Lorrine Kin recently.  Stretching exercises were advised.  Degeneration of intervertebral disc of lumbar region without discogenic back  pain or lower extremity pain -she continues to have lower back pain.  Grade 1 anterolisthesis of L4 versus L5 and grade 1 retrolisthesis of L1 versus L2 and L2 versus L3.  She had multilevel spondylosis and facet joint arthropathy  Chronic SI joint pain-chronic discomfort.  Age-related osteoporosis without current pathological fracture - she is on Prolia by her GYN.  Other medical problems are listed as follows:  Gait instability  History of IBS  History of hyperlipidemia  Essential hypertension, benign  History of gastroesophageal reflux (GERD)  Hypothyroidism due to acquired atrophy of thyroid  Grieving - patient lost her husband in October 2023.  There were married for 64 years.  Orders: Orders Placed This Encounter  Procedures   Trigger Point Inj   No orders of the defined types were placed in this encounter.   Follow-Up Instructions: Return in about 3 months (around 02/13/2024) for Osteoarthritis.   Pollyann Savoy, MD  Note - This record has been created using Animal nutritionist.  Chart creation errors have been sought, but may not always  have been located. Such creation errors do not reflect on  the standard of medical care.

## 2023-11-24 ENCOUNTER — Encounter: Payer: Self-pay | Admitting: Internal Medicine

## 2023-11-24 ENCOUNTER — Telehealth: Payer: Self-pay | Admitting: Radiology

## 2023-11-24 DIAGNOSIS — G8929 Other chronic pain: Secondary | ICD-10-CM

## 2023-11-24 NOTE — Telephone Encounter (Signed)
Copied from CRM [id number]. Topic: Clinical - Request for Lab/Test Order >> Nov 24, 2023 10:40 AM Fonda Kinder J wrote: Reason for CRM: Nurse Leavy Cella from [facility] called in stating that the pt has been complaining of lower back pain and wants to know if Dr. Posey Rea could send in an order for the pt to get an X-Ray of her back. She said that you can give the pt a call back to follow up

## 2023-11-25 ENCOUNTER — Telehealth: Payer: Self-pay | Admitting: Internal Medicine

## 2023-11-25 ENCOUNTER — Ambulatory Visit (HOSPITAL_BASED_OUTPATIENT_CLINIC_OR_DEPARTMENT_OTHER)
Admission: RE | Admit: 2023-11-25 | Discharge: 2023-11-25 | Disposition: A | Discharge status: Home / Self Care | Payer: Medicare Other | Source: Ambulatory Visit | Attending: Internal Medicine | Admitting: Internal Medicine

## 2023-11-25 DIAGNOSIS — M4316 Spondylolisthesis, lumbar region: Secondary | ICD-10-CM | POA: Diagnosis not present

## 2023-11-25 DIAGNOSIS — M5126 Other intervertebral disc displacement, lumbar region: Secondary | ICD-10-CM | POA: Diagnosis not present

## 2023-11-25 DIAGNOSIS — M25511 Pain in right shoulder: Secondary | ICD-10-CM

## 2023-11-25 DIAGNOSIS — M438X6 Other specified deforming dorsopathies, lumbar region: Secondary | ICD-10-CM | POA: Diagnosis not present

## 2023-11-25 DIAGNOSIS — M545 Low back pain, unspecified: Secondary | ICD-10-CM | POA: Diagnosis not present

## 2023-11-25 DIAGNOSIS — M47816 Spondylosis without myelopathy or radiculopathy, lumbar region: Secondary | ICD-10-CM | POA: Diagnosis not present

## 2023-11-25 DIAGNOSIS — G8929 Other chronic pain: Secondary | ICD-10-CM | POA: Diagnosis not present

## 2023-11-25 NOTE — Addendum Note (Signed)
Addended by: [name] on: 11/25/2023 07:59 AM   Modules accepted: Orders

## 2023-11-25 NOTE — Telephone Encounter (Signed)
I will send the x-ray ordered to [facility]  location.  Thank you

## 2023-11-25 NOTE — Telephone Encounter (Signed)
Spoke with [name] and was able to inform [name] that her x-ray orders are in and she does not need an apptmnt to have x-ray's done as pts can walk-in before 5:30 pm and have hr x-rays done. I have also informed [name] her apptmnt is here with Dr. Loren Racer on 11/28/2023.  [name] has stated understanding.

## 2023-11-25 NOTE — Telephone Encounter (Signed)
Copied from CRM [id number]. Topic: Clinical - Medical Advice >> Nov 25, 2023  9:38 AM Lorin Glass B wrote:  Reason for CRM: Patient states that Dr Posey Rea or his nurse contacted her regarding a shoulder xray at [facility] but did not give a date or time. Did not see any appts for xray nor any recent communications in patients chart regarding this. I explained her appt on Monday is regarding her shoulder and a confirmation text was sent. Patient insists she received an xray appt message and that she is supposed to have this xray done before her Monday appt at [facility]. Patient wants clarification from Dr Plotnikov's nurse. Callback [phone]

## 2023-11-25 NOTE — Telephone Encounter (Unsigned)
Copied from CRM [id number]. Topic: Clinical - Request for Lab/Test Order >> Nov 25, 2023  3:16 PM Samuel Jester B wrote: Reason for CRM: Pt is requesting a callback, she stated that she got an x-ray on her back however she is also needing an x-ray on her right shoulder as well.

## 2023-11-25 NOTE — Telephone Encounter (Signed)
Pt was informed of these orders via MyChart.

## 2023-11-27 ENCOUNTER — Encounter: Payer: Self-pay | Admitting: Physician Assistant

## 2023-11-28 ENCOUNTER — Ambulatory Visit (INDEPENDENT_AMBULATORY_CARE_PROVIDER_SITE_OTHER): Payer: Medicare Other | Admitting: Internal Medicine

## 2023-11-28 ENCOUNTER — Encounter: Payer: Self-pay | Admitting: Internal Medicine

## 2023-11-28 ENCOUNTER — Ambulatory Visit (INDEPENDENT_AMBULATORY_CARE_PROVIDER_SITE_OTHER): Payer: Medicare Other

## 2023-11-28 VITALS — BP 110/70 | HR 84 | Temp 98.3°F | Ht 60.0 in | Wt 115.0 lb

## 2023-11-28 DIAGNOSIS — M545 Low back pain, unspecified: Secondary | ICD-10-CM | POA: Insufficient documentation

## 2023-11-28 DIAGNOSIS — M542 Cervicalgia: Secondary | ICD-10-CM

## 2023-11-28 DIAGNOSIS — E785 Hyperlipidemia, unspecified: Secondary | ICD-10-CM | POA: Insufficient documentation

## 2023-11-28 DIAGNOSIS — G8929 Other chronic pain: Secondary | ICD-10-CM | POA: Insufficient documentation

## 2023-11-28 DIAGNOSIS — M4802 Spinal stenosis, cervical region: Secondary | ICD-10-CM | POA: Diagnosis not present

## 2023-11-28 DIAGNOSIS — M25511 Pain in right shoulder: Secondary | ICD-10-CM

## 2023-11-28 DIAGNOSIS — S12300A Unspecified displaced fracture of fourth cervical vertebra, initial encounter for closed fracture: Secondary | ICD-10-CM | POA: Diagnosis not present

## 2023-11-28 DIAGNOSIS — M19011 Primary osteoarthritis, right shoulder: Secondary | ICD-10-CM | POA: Diagnosis not present

## 2023-11-28 DIAGNOSIS — M778 Other enthesopathies, not elsewhere classified: Secondary | ICD-10-CM | POA: Diagnosis not present

## 2023-11-28 DIAGNOSIS — S12101A Unspecified nondisplaced fracture of second cervical vertebra, initial encounter for closed fracture: Secondary | ICD-10-CM | POA: Diagnosis not present

## 2023-11-28 DIAGNOSIS — S12000A Unspecified displaced fracture of first cervical vertebra, initial encounter for closed fracture: Secondary | ICD-10-CM | POA: Diagnosis not present

## 2023-11-28 MED ORDER — METHYLPREDNISOLONE 4 MG PO TBPK: ORAL_TABLET | ORAL | 0 refills | Status: DC

## 2023-11-28 NOTE — Patient Instructions (Addendum)
Use rice sock heating pad  Hold Simvastatin 2 weeks (large round pink pill)  Blue-Emu cream -use 2-3 times a day

## 2023-11-28 NOTE — Assessment & Plan Note (Signed)
Voltaren gel X ray R shoulder Medrol pack OK CBD use

## 2023-11-28 NOTE — Progress Notes (Signed)
Subjective:  Patient ID: [name], female    DOB: 1936/01/06  Age: 87 y.o. MRN: [id number]  CC: Medical Management of Chronic Issues (Discuss x-rays and rt shoulder pain, review medications)   HPI [name] presents for [name], R shoulder pain Epidural shot did not help much  C/o R shoulder and neck pain over the trap Trying CBD 15 mg bid   Outpatient Medications Prior to Visit  Medication Sig Dispense Refill   acetaminophen (TYLENOL) 500 MG tablet Take 1,000 mg by mouth every 6 (six) hours as needed for mild pain.     albuterol (VENTOLIN HFA) 108 (90 Base) MCG/ACT inhaler Take 2 puffs every 4-6 hours as needed     amLODipine (NORVASC) 2.5 MG tablet Take 2.5 mg by mouth as needed.     Azelastine HCl 0.15 % SOLN Place into both nostrils. Place into both nostrils.     Calcium Carbonate-Vitamin D (CALCIUM CARBONATE W/VITAMIN D PO) Take 1 tablet by mouth daily.     cyanocobalamin (VITAMIN B12) 1000 MCG tablet Take 1 tablet (1,000 mcg total) by mouth daily. 100 tablet 3   desonide (DESOWEN) 0.05 % cream Apply 1 application topically as needed.     diclofenac Sodium (VOLTAREN) 1 % GEL APPLY 4 GRAMS 4 TIMES DAILY. 200 g 3   EPINEPHrine (EPIPEN 2-PAK) 0.3 mg/0.3 mL IJ SOAJ injection Inject 0.3 mLs (0.3 mg total) into the muscle as needed. 1 Device 1   erythromycin ophthalmic ointment Place 1 Application into both eyes at bedtime. (Patient not taking: Reported on 11/15/2023) 3.5 g 0   famotidine (PEPCID) 40 MG tablet Take 1 tablet (40 mg total) by mouth at bedtime. NEEDS OFFICE VISIT FOR ADDITIONAL REFILLS (Patient taking differently: Take 40 mg by mouth as needed. NEEDS OFFICE VISIT FOR ADDITIONAL REFILLS) 30 tablet 2   Gauze Pads & Dressings (TELFA ADHESIVE DRESSING) 3"X4" PADS Use qd (Patient not taking: Reported on 11/15/2023) 20 each 0   hydrocortisone (ANUSOL-HC) 2.5 % rectal cream Place 1 Application rectally 2 (two) times daily. Insert per rectum 14 g 1   ipratropium (ATROVENT) 0.06  % nasal spray Place 1 spray into the nose 3 (three) times daily as needed for rhinitis. 15 mL 0   levocetirizine (XYZAL) 5 MG tablet Take 5 mg by mouth every evening.     levothyroxine (SYNTHROID) 50 MCG tablet TAKE ONE TABLET BY MOUTH DAILY BEFORE BREAKFAST 30 tablet 5   loratadine (CLARITIN) 10 MG tablet Take 1 tablet (10 mg total) by mouth daily. 100 tablet 3   LORazepam (ATIVAN) 1 MG tablet Take 2-3 tablets at bedtime and 1-2 tablets in the daytime as needed. 150 tablet 2   memantine (NAMENDA) 10 MG tablet Take 1 tablet (10 mg total) by mouth 2 (two) times daily. 60 tablet 11   montelukast (SINGULAIR) 10 MG tablet Take 1 tablet (10 mg total) by mouth at bedtime. 90 tablet 1   neomycin-polymyxin-hydrocortisone (CORTISPORIN) OTIC solution Place 3 drops into both ears 3 (three) times daily. (Patient not taking: Reported on 11/15/2023) 10 mL 0   oxymetazoline (AFRIN 12 HOUR) 0.05 % nasal spray Place 1 spray into both nostrils as needed.     pantoprazole (PROTONIX) 40 MG tablet Take 40 mg by mouth 2 (two) times daily.     polyethylene glycol powder (GLYCOLAX/MIRALAX) 17 GM/SCOOP powder Take 17 g by mouth 2 (two) times daily as needed for moderate constipation. 500 g 3   prednisoLONE acetate (PRED FORTE) 1 %  ophthalmic suspension 1 drop 3 (three) times daily. (Patient not taking: Reported on 11/15/2023)     predniSONE (DELTASONE) 20 MG tablet Take 1 tablet (20 mg total) by mouth 2 (two) times daily. (Patient not taking: Reported on 11/15/2023) 10 tablet 0   psyllium (METAMUCIL SMOOTH TEXTURE) 58.6 % powder 1 scoop daily 283 g 6   simvastatin (ZOCOR) 40 MG tablet TAKE ONE TABLET ONCE DAILY 90 tablet 3   solifenacin (VESICARE) 10 MG tablet Take 1 tablet (10 mg total) by mouth daily as needed (bladder). (Patient taking differently: Take 10 mg by mouth at bedtime.) 90 tablet 3   traMADol (ULTRAM) 50 MG tablet Take 1 tablet (50 mg total) by mouth every 6 (six) hours as needed for severe pain. 120 tablet 2    triamcinolone (NASACORT) 55 MCG/ACT AERO nasal inhaler Place 2 sprays into the nose as needed.     triamcinolone ointment (KENALOG) 0.1 % Apply 1 Application topically 3 (three) times daily. As needed for itching 80 g 2   No facility-administered medications prior to visit.    ROS: Review of Systems  Constitutional:  Negative for activity change, appetite change, chills, fatigue and unexpected weight change.  HENT:  Negative for congestion, mouth sores and sinus pressure.   Eyes:  Negative for visual disturbance.  Respiratory:  Negative for cough and chest tightness.   Gastrointestinal:  Negative for abdominal pain and nausea.  Genitourinary:  Negative for difficulty urinating, frequency and vaginal pain.  Musculoskeletal:  Positive for arthralgias, back pain, neck pain and neck stiffness. Negative for gait problem.  Skin:  Negative for pallor and rash.  Neurological:  Negative for dizziness, tremors, weakness, numbness and headaches.  Psychiatric/Behavioral:  Negative for confusion, sleep disturbance and suicidal ideas.     Objective:  BP 110/70 (BP Location: Left Arm, Patient Position: Sitting, Cuff Size: Normal)   Pulse 84   Temp 98.3 F (36.8 C) (Oral)   Ht 5' (1.524 m)   Wt 115 lb (52.2 kg)   SpO2 93%   BMI 22.46 kg/m   BP Readings from Last 3 Encounters:  11/28/23 110/70  11/15/23 134/81  10/12/23 128/88    Wt Readings from Last 3 Encounters:  11/28/23 115 lb (52.2 kg)  11/15/23 115 lb (52.2 kg)  10/12/23 114 lb (51.7 kg)    Physical Exam Constitutional:      General: She is not in acute distress.    Appearance: Normal appearance. She is well-developed.  HENT:     Head: Normocephalic.     Right Ear: External ear normal.     Left Ear: External ear normal.     Nose: Nose normal.  Eyes:     General:        Right eye: No discharge.        Left eye: No discharge.     Conjunctiva/sclera: Conjunctivae normal.     Pupils: Pupils are equal, round, and reactive  to light.  Neck:     Thyroid: No thyromegaly.     Vascular: No JVD.     Trachea: No tracheal deviation.  Cardiovascular:     Rate and Rhythm: Normal rate and regular rhythm.     Heart sounds: Normal heart sounds.  Pulmonary:     Effort: No respiratory distress.     Breath sounds: No stridor. No wheezing.  Abdominal:     General: Bowel sounds are normal. There is no distension.     Palpations: Abdomen is soft. There is no  mass.     Tenderness: There is no abdominal tenderness. There is no guarding or rebound.  Musculoskeletal:        General: Tenderness present.     Cervical back: Normal range of motion and neck supple. No rigidity.     Right lower leg: No edema.     Left lower leg: No edema.  Lymphadenopathy:     Cervical: No cervical adenopathy.  Skin:    Findings: No erythema or rash.  Neurological:     Mental Status: Mental status is at baseline.     Cranial Nerves: No cranial nerve deficit.     Motor: No abnormal muscle tone.     Coordination: Coordination normal.     Deep Tendon Reflexes: Reflexes normal.  Psychiatric:        Behavior: Behavior normal.        Thought Content: Thought content normal.        Judgment: Judgment normal.   R trap is very tender. Neck is stiff LS w/pain  Lab Results  Component Value Date   WBC 7.5 10/05/2023   HGB 12.8 10/05/2023   HCT 39.6 10/05/2023   PLT 267.0 10/05/2023   GLUCOSE 94 10/05/2023   CHOL 184 09/12/2023   TRIG 139.0 09/12/2023   HDL 59.90 09/12/2023   LDLDIRECT 98.0 08/13/2021   LDLCALC 96 09/12/2023   ALT 23 10/05/2023   AST 22 10/05/2023   NA 134 (L) 10/05/2023   K 4.0 10/05/2023   CL 101 10/05/2023   CREATININE 0.80 10/05/2023   BUN 15 10/05/2023   CO2 25 10/05/2023   TSH 1.70 09/12/2023   HGBA1C 5.5 06/13/2020    DG Lumbar Spine 2-3 Views Result Date: 11/25/2023 CLINICAL DATA:  Chronic bilateral low back pain. No history of trauma reported EXAM: LUMBAR SPINE - 3 VIEW COMPARISON:  02/01/2023 x-ray.  FINDINGS: Five lumbar-type vertebral bodies. Preserved vertebral body heights. Global osteopenia. Scattered mild-to-moderate endplate osteophytes identified diffusely. Multilevel severe disc height loss greatest at L5-S1, T12-L1. Grade 1 anterolisthesis of L4 on L5 and trace L5 on S1. There is also some retrolisthesis at L3-4, L2-3 and L1-2. Multilevel severe facet degenerative changes. Slight levoconvex curvature of the mid lumbar spine on the frontal view. Degenerative changes of the sacroiliac joints. Presumed vascular calcifications. IMPRESSION: Severe degenerative changes with curvature and multilevel listhesis. Electronically Signed   By: Karen Kays M.D.   On: 11/25/2023 16:03    Assessment & Plan:   Problem List Items Addressed This Visit     Neck pain   Voltaren gel X ray C spine Medrol pack OK CBD use      Relevant Orders   DG Cervical Spine Complete   Chronic low back pain - Primary   Voltaren gel X ray LS spine Medrol pack OK CBD use      Relevant Medications   methylPREDNISolone (MEDROL DOSEPAK) 4 MG TBPK tablet   Chronic right shoulder pain   Voltaren gel X ray R shoulder Medrol pack OK CBD use      Relevant Medications   methylPREDNISolone (MEDROL DOSEPAK) 4 MG TBPK tablet   Other Relevant Orders   DG Cervical Spine Complete   Dyslipidemia   Hold simvastatin x 2 wks due to arthralgias         Meds ordered this encounter  Medications   DISCONTD: methylPREDNISolone (MEDROL DOSEPAK) 4 MG TBPK tablet    Sig: As directed    Dispense:  21 tablet    Refill:  0   methylPREDNISolone (MEDROL DOSEPAK) 4 MG TBPK tablet    Sig: As directed    Dispense:  21 tablet    Refill:  0    Please deliver today      Follow-up: Return in about 6 weeks (around 01/09/2024) for a follow-up visit.  Sonda Primes, MD

## 2023-11-28 NOTE — Assessment & Plan Note (Signed)
Voltaren gel X ray LS spine Medrol pack OK CBD use

## 2023-11-28 NOTE — Assessment & Plan Note (Signed)
Voltaren gel X ray C spine Medrol pack OK CBD use

## 2023-11-28 NOTE — Telephone Encounter (Signed)
Okay. Thank you.

## 2023-11-28 NOTE — Assessment & Plan Note (Signed)
Hold simvastatin x 2 wks due to arthralgias

## 2023-11-29 ENCOUNTER — Encounter: Payer: Self-pay | Admitting: Physician Assistant

## 2023-11-29 ENCOUNTER — Ambulatory Visit (INDEPENDENT_AMBULATORY_CARE_PROVIDER_SITE_OTHER): Payer: Medicare Other | Admitting: Physician Assistant

## 2023-11-29 VITALS — BP 130/76 | HR 92 | Ht 60.0 in | Wt 113.0 lb

## 2023-11-29 DIAGNOSIS — G3184 Mild cognitive impairment, so stated: Secondary | ICD-10-CM | POA: Diagnosis not present

## 2023-11-29 MED ORDER — MEMANTINE HCL ER 14 MG PO CP24: 14.0000 mg | ORAL_CAPSULE | Freq: Every evening | ORAL | 11 refills | Status: DC

## 2023-11-29 NOTE — Patient Instructions (Addendum)
 It was a pleasure to see you today at our office.   Recommendations:  Memantine  XR  14 mg at night     Follow up in 6 months  OK with second opinion at [facility]. If ok with that plan will order a PET amyloid scan to look for plaques   Whom to call:  Memory  decline, memory medications: Call our office [phone]   For psychiatric meds, mood meds: Please have your primary care physician manage these medications.    For assessment of decision of mental capacity and competency:  Call Dr. Rosaline Nine, geriatric psychiatrist at [phone]  For guidance in geriatric dementia issues please call [facility] [phone]     If you have any severe symptoms of a stroke, or other severe issues such as confusion,severe chills or fever, etc call 911 or go to the ER as you may need to be evaluated further         RECOMMENDATIONS FOR ALL PATIENTS WITH MEMORY PROBLEMS: 1. Continue to exercise (Recommend 30 minutes of walking everyday, or 3 hours every week) 2. Increase social interactions - continue going to [place] and enjoy social gatherings with friends and family 3. Eat healthy, avoid fried foods and eat more fruits and vegetables 4. Maintain adequate blood pressure, blood sugar, and blood cholesterol level. Reducing the risk of stroke and cardiovascular disease also helps promoting better memory. 5. Avoid stressful situations. Live a simple life and avoid aggravations. Organize your time and prepare for the next day in anticipation. 6. Sleep well, avoid any interruptions of sleep and avoid any distractions in the bedroom that may interfere with adequate sleep quality 7. Avoid sugar, avoid sweets as there is a strong link between excessive sugar intake, diabetes, and cognitive impairment We discussed the Mediterranean diet, which has been shown to help patients reduce the risk of progressive memory disorders and reduces cardiovascular risk. This includes eating fish, eat fruits  and green leafy vegetables, nuts like almonds and hazelnuts, walnuts, and also use olive oil. Avoid fast foods and fried foods as much as possible. Avoid sweets and sugar as sugar use has been linked to worsening of memory function.  There is always a concern of gradual progression of memory problems. If this is the case, then we may need to adjust level of care according to patient needs. Support, both to the patient and caregiver, should then be put into place.      You have been referred for a neuropsychological evaluation (i.e., evaluation of memory and thinking abilities). Please bring someone with you to this appointment if possible, as it is helpful for the doctor to hear from both you and another adult who knows you well. Please bring eyeglasses and hearing aids if you wear them.    The evaluation will take approximately 3 hours and has two parts:   The first part is a clinical interview with the neuropsychologist (Dr. Richie or Dr. Jackquline). During the interview, the neuropsychologist will speak with you and the individual you brought to the appointment.    The second part of the evaluation is testing with the doctor's technician Neal or Luke). During the testing, the technician will ask you to remember different types of material, solve problems, and answer some questionnaires. Your family member will not be present for this portion of the evaluation.   Please note: We must reserve several hours of the neuropsychologist's time and the psychometrician's time for your evaluation appointment. As such, there is  a No-Show fee of $100. If you are unable to attend any of your appointments, please contact our office as soon as possible to reschedule.    FALL PRECAUTIONS: Be cautious when walking. Scan the area for obstacles that may increase the risk of trips and falls. When getting up in the mornings, sit up at the edge of the bed for a few minutes before getting out of bed. Consider elevating the  bed at the head end to avoid drop of blood pressure when getting up. Walk always in a well-lit room (use night lights in the walls). Avoid area rugs or power cords from appliances in the middle of the walkways. Use a walker or a cane if necessary and consider physical therapy for balance exercise. Get your eyesight checked regularly.  FINANCIAL OVERSIGHT: Supervision, especially oversight when making financial decisions or transactions is also recommended.  HOME SAFETY: Consider the safety of the kitchen when operating appliances like stoves, microwave oven, and blender. Consider having supervision and share cooking responsibilities until no longer able to participate in those. Accidents with firearms and other hazards in the house should be identified and addressed as well.   ABILITY TO BE LEFT ALONE: If patient is unable to contact 911 operator, consider using LifeLine, or when the need is there, arrange for someone to stay with patients. Smoking is a fire hazard, consider supervision or cessation. Risk of wandering should be assessed by caregiver and if detected at any point, supervision and safe proof recommendations should be instituted.  MEDICATION SUPERVISION: Inability to self-administer medication needs to be constantly addressed. Implement a mechanism to ensure safe administration of the medications.   DRIVING: Regarding driving, in patients with progressive memory problems, driving will be impaired. We advise to have someone else do the driving if trouble finding directions or if minor accidents are reported. Independent driving assessment is available to determine safety of driving.   If you are interested in the driving assessment, you can contact the following:  [employer] in [place] [phone]  [facility] [phone]  [facility] [phone] or [phone]    Mediterranean Diet A  Mediterranean diet refers to food and lifestyle choices that are based on the traditions of countries located on the [employer]. This way of eating has been shown to help prevent certain conditions and improve outcomes for people who have chronic diseases, like kidney disease and heart disease. What are tips for following this plan? Lifestyle  Cook and eat meals together with your family, when possible. Drink enough fluid to keep your urine clear or pale yellow. Be physically active every day. This includes: Aerobic exercise like running or swimming. Leisure activities like gardening, walking, or housework. Get 7-8 hours of sleep each night. If recommended by your health care provider, drink red wine in moderation. This means 1 glass a day for nonpregnant women and 2 glasses a day for men. A glass of wine equals 5 oz (150 mL). Reading food labels  Check the serving size of packaged foods. For foods such as rice and pasta, the serving size refers to the amount of cooked product, not dry. Check the total fat in packaged foods. Avoid foods that have saturated fat or trans fats. Check the ingredients list for added sugars, such as corn syrup. Shopping  At the grocery store, buy most of your food from the areas near the walls of the store. This includes: Fresh fruits and vegetables (produce). Grains,  beans, nuts, and seeds. Some of these may be available in unpackaged forms or large amounts (in bulk). Fresh seafood. Poultry and eggs. Low-fat dairy products. Buy whole ingredients instead of prepackaged foods. Buy fresh fruits and vegetables in-season from local farmers markets. Buy frozen fruits and vegetables in resealable bags. If you do not have access to quality fresh seafood, buy precooked frozen shrimp or canned fish, such as tuna, salmon, or sardines. Buy small amounts of raw or cooked vegetables, salads, or olives from the deli or salad bar at your store. Stock your pantry so you  always have certain foods on hand, such as olive oil, canned tuna, canned tomatoes, rice, pasta, and beans. Cooking  Cook foods with extra-virgin olive oil instead of using butter or other vegetable oils. Have meat as a side dish, and have vegetables or grains as your main dish. This means having meat in small portions or adding small amounts of meat to foods like pasta or stew. Use beans or vegetables instead of meat in common dishes like chili or lasagna. Experiment with different cooking methods. Try roasting or broiling vegetables instead of steaming or sauteing them. Add frozen vegetables to soups, stews, pasta, or rice. Add nuts or seeds for added healthy fat at each meal. You can add these to yogurt, salads, or vegetable dishes. Marinate fish or vegetables using olive oil, lemon juice, garlic, and fresh herbs. Meal planning  Plan to eat 1 vegetarian meal one day each week. Try to work up to 2 vegetarian meals, if possible. Eat seafood 2 or more times a week. Have healthy snacks readily available, such as: Vegetable sticks with hummus. Greek yogurt. Fruit and nut trail mix. Eat balanced meals throughout the week. This includes: Fruit: 2-3 servings a day Vegetables: 4-5 servings a day Low-fat dairy: 2 servings a day Fish, poultry, or lean meat: 1 serving a day Beans and legumes: 2 or more servings a week Nuts and seeds: 1-2 servings a day Whole grains: 6-8 servings a day Extra-virgin olive oil: 3-4 servings a day Limit red meat and sweets to only a few servings a month What are my food choices? Mediterranean diet Recommended Grains: Whole-grain pasta. Brown rice. Bulgar wheat. Polenta. Couscous. Whole-wheat bread. [place] Made[place]. Vegetables: Artichokes. Beets. Broccoli. Cabbage. Carrots. Eggplant. Green beans. Chard. Kale. Spinach. Onions. Leeks. Peas. Squash. Tomatoes. Peppers. Radishes. Fruits: Apples. Apricots. Avocado. Berries. Bananas. Cherries. Dates. Figs. Grapes. Lemons.  Melon. Oranges. Peaches. Plums. Pomegranate. Meats and other protein foods: Beans. Almonds. Sunflower seeds. Pine nuts. Peanuts. Cod. Salmon. Scallops. Shrimp. Tuna. Tilapia. Clams. Oysters. Eggs. Dairy: Low-fat milk. Cheese. Greek yogurt. Beverages: Water. Red wine. Herbal tea. Fats and oils: Extra virgin olive oil. Avocado oil. Grape seed oil. Sweets and desserts: Greek yogurt with honey. Baked apples. Poached pears. Trail mix. Seasoning and other foods: Basil. Cilantro. Coriander. Cumin. Mint. Parsley. Sage. Rosemary. Tarragon. Garlic. Oregano. Thyme. Pepper. Balsalmic vinegar. Tahini. Hummus. Tomato sauce. Olives. Mushrooms. Limit these Grains: Prepackaged pasta or rice dishes. Prepackaged cereal with added sugar. Vegetables: Deep fried potatoes (french fries). Fruits: Fruit canned in syrup. Meats and other protein foods: Beef. Pork. Lamb. Poultry with skin. Hot dogs. [place]. Dairy: Ice cream. Sour cream. Whole milk. Beverages: Juice. Sugar-sweetened soft drinks. Beer. Liquor and spirits. Fats and oils: Butter. Canola oil. Vegetable oil. Beef fat (tallow). Lard. Sweets and desserts: Cookies. Cakes. Pies. Candy. Seasoning and other foods: Mayonnaise. Premade sauces and marinades. The items listed may not be a complete list. Talk with your dietitian about  what dietary choices are right for you. Summary The Mediterranean diet includes both food and lifestyle choices. Eat a variety of fresh fruits and vegetables, beans, nuts, seeds, and whole grains. Limit the amount of red meat and sweets that you eat. Talk with your health care provider about whether it is safe for you to drink red wine in moderation. This means 1 glass a day for nonpregnant women and 2 glasses a day for men. A glass of wine equals 5 oz (150 mL). This information is not intended to replace advice given to you by your health care provider. Make sure you discuss any questions you have with your health care provider. Document  Released: 07/08/2016 Document Revised: 09/12/2 Your provider has requested that you have labwork completed today. Please go to [facility] ([id number]) on the second floor of this building before leaving the office today. You do not need to check in. If you are not called within 15 minutes please check with the front desk.  Your Mri will be At [facility]

## 2023-11-29 NOTE — Progress Notes (Signed)
 Assessment/Plan:   Mild cognitive impairment with memory loss, concern for Alzheimer's disease  [name] is a very pleasant 87 y.o. RH female with a history of hypertension, CHF, h/o posterior L frontal meningioma (16 x 11 by 17 mmm on 03/2021 MRI), hyperlipidemia, situational depression, anxiety, hypothyroidism, GERD, IBS, OA,  presenting today in follow-up for evaluation of memory loss. Patient is on memantine  10 mg , although she has been taking only the evening dose ash she was experiencing increased sleepiness with bid, tolerating it well. She reports that her memory is stable. Discussed with patient starting memantine  XR 14 mg nightly instead  for better, 24 h coverage, she is interested in trying. Her son [name] wanted to discuss her status, but he is not in the contact list, unable to discuss for HIPAA reasons. Her son Ubaldo is on the designated party list and contact, HCPOA  but he is out of town, will return next week, he can contact us  then. Children are interested in  second opinion at [facility] for other therapeutic options, although she prefers this current therapy. Given her advanced age and risk factors,agree with the patient that drugs such as lecanemab or Donanemab may provoke undesirable side effects, risks outweighing the benefits. If after discussion with son Ubaldo they would want to seek second opinion, will arrange that, preceded with plans for further testing, which would include likely a PET amyloid and/or LP as confirming diagnostic tool. Mood is stable, controlled with current meds.Patient is able to participate on his ADLs, no longer drives.           Recommendations:   Follow up in  6 months. Start memantine  XR 14 mg nightly, side effects discussed, discontinue memantine  10 mg bid.  Repeat neuropsych evaluation in 10-16 months Continue psychotherapy for situational depression Recommend good control of cardiovascular risk factors Continue to control mood as per  PCP    Subjective:   This patient is here alone.  Previous records as well as any outside records available were reviewed prior to todays visit.   Patient was last seen on  08/15/23     Any changes in memory since last visit? About the samePatient has some difficulty remembering recent conversations and names of people. II just need a little time, that is it, I am not young.  repeats oneself?  Endorsed Disoriented when walking into a room?  Patient denies    Misplacing objects?  Patient denies   Wandering behavior?   denies   Any personality changes since last visit?   denies   Any worsening depression?: denies. She still is grieving the death of her husband, she does psychotherapy.   Hallucinations or paranoia? As a coping mechanism, she talks to him daily, and sometimes she sees him sitting at the lounge chair. She knows it is a hallucination, she find is very comforting.I know it may just be  wishful thinking.  Seizures?   denies    Any sleep changes? Sleeps well  Denies vivid dreams sometimes I dream of him, REM behavior or sleepwalking   Sleep apnea?   denies    Any hygiene concerns?   denies   Independent of bathing and dressing?  Endorsed  Does the patient needs help with medications? Patient is in charge    Who is in charge of the finances?  Patient is in charge, sons monitor.     Any changes in appetite?  denies     Patient have trouble swallowing?  denies  Does the patient cook?  Any kitchen accidents such as leaving the stove on?   denies   Any headaches?    denies   Vision changes? denies Chronic pain? She has issues with neck and sciatic pain, followed by neurosurgery.   Ambulates with difficulty?    denies    Recent falls or head injuries?    denies      Unilateral weakness, numbness or tingling?   denies   Any tremors?  denies   Any anosmia?    denies   Any incontinence of urine?  At night she may need to get up to urinate.   Any bowel dysfunction?   denies      Patient lives alone at [place].      Does the patient drive?no longer drives   Neuropsych evaluation 08/2023 Briefly, results suggested a primary weakness surrounding delayed retrieval and recognition/consolidation aspects of memory. Further performance variability was exhibited across processing speed and visuospatial abilities. The etiology for ongoing memory dysfunction is unclear at the present time. Across memory testing, Ms. Capozzoli demonstrated an adequate ability to learn novel information initially. However, after brief delays, retention rates ranged from 17% to 55%. While variable, performance across yes/no recognition trials was further poor across 2/3 memory tasks. Taken together, this could suggest very early concerns for rapid forgetting and an evolving storage impairment. This would subsequently raise concern for very early stages of an underlying neurodegenerative illness such as Alzheimer's disease. It is important to highlight that performances across all non-memory areas commonly implicated by the latter illness were appropriate. I cannot rule out the presence of this illness in extremely early stages. However, it remains inappropriate to state the presence of this illness with any degree of confidence. Other potential culprits for cognitive dysfunction would surround bereavement and other depressive symptoms, her myriad of medical conditions and chronic pain, and medication side effects (especially surrounding Ativan , tramadol , and prednisone ).    Initial evaluation 11/08/2022 How long did patient have memory difficulties? [name] , her daughter told her that for the last 6 months she was forgetting things 10 minutes later so now she writes what she needs to remember in a pad. However, she never forgets meds or appt's, but s he may experience some difficulties with recent conversation someone's names. For a few weeks she was not active, trying to adjust to her loss, but  recently I just started to read again, watch Netflix.    repeats oneself? Denies  Disoriented when walking into a room?  Patient denies   Leaving objects in unusual places?  I am always leaving my phone everywhere but not in unusual places Ambulates  with difficulty?   She has neuropathy and radiculopathy, so she is a little wobbly, she was on gabapentin , now on Lyrica. She is doing PT and balance therapy and it helps  Recent falls? Recently she had a mechanical fall after tripping over an object and sustaining a head injury with 1 inch laceration above the L eye and some skin tear on the R knee, no LOC, just a little concussion.  History of seizures?   Patient denies   Wandering behavior?  Patient denies   Patient drives?   Patient no longer drives Any personality changes?  She reports more anxiety, I know he (her husband Seabron)  is not here, but I talk to him good morning because it is comforting  Any history of depression?: She is experiencing grief depression as her husband of 53  years died on Oct 7 . She is starting therapy [employer]) and will start grief group at [facility] this week.  Hallucinations or paranoia?  Patient denies   She has mild insomnia after her husband death. She falls asleep with the TV on. Denies vivid dreams except one time she dreamt about Seabron it was nice, denies REM behavior or sleepwalking . History of sleep apnea?  Patient denies   Any hygiene concerns?  Patient denies   Independent of bathing and dressing?  Endorsed  Does the patient needs help with medications? Patient is in charge  Who is in charge of the finances?  Patient is in charge   Any changes in appetite?  Patient denies   Patient have trouble swallowing? Patient denies, I live in [facility], I don't have to Does the patient cook? Any kitchen accidents such as leaving the stove on?  Patient denies   Any headaches?  Patient denies   Double vision? Patient denies   Any focal  numbness or tingling? She has chronic paresthesias.  Chronic pain?  She has some R  buttock, RLE pain due to radiculopathy, on LS injections by NS, gabapentin  and tramadol  prn..  She has a history of C spine fusion as well.  Unilateral weakness?  Patient denies   Any tremors?  Patient denies   Any history of anosmia?  Patient denies   Any incontinence of urine?  She reports nocturia  Any bowel dysfunction?   Patient denies    History of heavy alcohol intake?  Patient denies   History of heavy tobacco use?  Patient denies   Family history of dementia?   Negative for  Alzheimer's disease   Patient lives:  [place] [place] at [facility], her 2 sons live in [place]   Pertinent studies 2 D echo  EF 60-65, mild AI, no sign AS B12 229 (December 2023)    MRI of the brain personally reviewed remarkable for a known calcified meningioma on the posterior left frontal lobe measuring 1.7 x 1.6 cm and change since prior MRI in May 2022.  No parenchymal edema was noted.  There is mild chronic small vessel ischemic changes within the cerebral white matter, slightly progressed from prior MRI, as well as mild to moderate generalized cerebral atrophy, which is stable.    Initial evaluation 11/08/2022 How long did patient have memory difficulties? [name] , her daughter told her that for the last 6 months she was forgetting things 10 minutes later so now she writes what she needs to remember in a pad. However, she never forgets meds or appt's, but s he may experience some difficulties with recent conversation someone's names. For a few weeks she was not active, trying to adjust to her loss, but recently I just started to read again, watch Netflix.    repeats oneself? Denies  Disoriented when walking into a room?  Patient denies   Leaving objects in unusual places?  I am always leaving my phone everywhere but not in unusual places Ambulates  with difficulty?   She has neuropathy and radiculopathy, so she is a little  wobbly, she was on gabapentin , now on Lyrica. She is doing PT and balance therapy and it helps  Recent falls? Recently she had a mechanical fall after tripping over an object and sustaining a head injury with 1 inch laceration above the L eye and some skin tear on the R knee, no LOC, just a little concussion.  History of seizures?  Patient denies   Wandering behavior?  Patient denies   Patient drives?   Patient no longer drives Any personality changes?  She reports more anxiety, I know he (her husband Seabron)  is not here, but I talk to him good morning because it is comforting  Any history of depression?: She is experiencing grief depression as her husband of 59 years died on 09/22/2024. She is starting therapy [employer]) and will start grief group at [facility] this week.  Hallucinations or paranoia?  Patient denies   She has mild insomnia after her husband death. She falls asleep with the TV on. Denies vivid dreams except one time she dreamt about Seabron it was nice, denies REM behavior or sleepwalking . History of sleep apnea?  Patient denies   Any hygiene concerns?  Patient denies   Independent of bathing and dressing?  Endorsed  Does the patient needs help with medications? Patient is in charge  Who is in charge of the finances?  Patient is in charge   Any changes in appetite?  Patient denies   Patient have trouble swallowing? Patient denies, I live in [facility], I don't have to Does the patient cook? Any kitchen accidents such as leaving the stove on?  Patient denies   Any headaches?  Patient denies   Double vision? Patient denies   Any focal numbness or tingling? She has chronic paresthesias.  Chronic pain?  She has some R  buttock, RLE pain due to radiculopathy, on LS injections by NS, gabapentin  and tramadol  prn..  She has a history of C spine fusion as well.  Unilateral weakness?  Patient denies   Any tremors?  Patient denies   Any history of anosmia?  Patient denies    Any incontinence of urine?  She reports nocturia  Any bowel dysfunction?   Patient denies    History of heavy alcohol intake?  Patient denies   History of heavy tobacco use?  Patient denies   Family history of dementia?   Negative for  Alzheimer's disease   Patient lives:  [place] [place] [facility], her 2 sons live in [place]   Pertinent studies 2 D echo  EF 60-65, mild AI, no sign AS B12 229 (December 2023)  MRI brain personally reviewed 10/31/23 remarkable for known,  17 x 16 mm partially calcified meningioma overlying the posterior left frontal lobe, unchanged in size from the brain MRI of 12/04/2022. As before, the mass indents the underlying brain parenchyma without underlying parenchymal edema. 2. Mild chronic small vessel ischemic changes within the cerebral white matter, similar to the prior MRI. 3. Mild generalized cerebral atrophy. 4. Mild right sphenoid sinusitis.  Past Medical History:  Diagnosis Date   Allergic rhinitis due to animal (cat) (dog) hair and dander 08/07/2021   Allergic rhinitis due to pollen 08/07/2021   Aortic valve sclerosis 03/12/2015   Atrophic gastritis 09/03/2008   Atrophic vaginitis    Baker's cyst    Left-Dr. Aluisio   Bladder pain 08/19/2014   Chronic  Take Vesicare  10 mg/d  UA was ok  On Premarin cream PV per GYN  On Vesicare   F/u w/Dr JONELLE Moats     Cataract    Dr. Octavia   Cervical spinal cord compression 05/04/2021   Cervical spine instability 05/15/2021   Dr. Unice: plan -  posterior C1-2 decompression and instrumented fusion.  MRI: Progressive soft tissue pannus at C1-2 is now creating mass  effect on the craniocervical junction with distortion  of the upper  spinal cord. This is likely related rheumatoid arthritis  C spine CT: Fracture of the anterior arch of C1 with 12 mm separation. Nondisplaced fracture posterior arch of C1. There is later   Cervical spondylosis 01/05/2019   Chalazion left upper eyelid 04/18/2023   X2  Keflex  po - too big... Will use  Ceftin   F/u w/Ophthalmology   CHF (congestive heart failure)    Cholelithiasis 09/09/2019   Per Dr Abran: At this point we mutually decided to watch her abdominal complaints.  If symptoms accelerate or become more classic for symptomatic cholelithiasis, then she is agreeable to surgical referral.  She will keep me posted   Chronic left SI joint pain 11/30/2022   Closed fracture of first cervical vertebra 08/07/2021   Closed fracture of second cervical vertebra 08/07/2021   Concussion 10/29/2021   s/p a hard fall after she rushed and slipped on the wet marble floor while visiting family in [place]  a couple week ago.  She hit the side of her skull, had a skin laceration bled quite a bit.  She was dazed and according to her son [name] had a loss of consciousness of under a minute duration.  She felt dazed.  She was taken to ER.  2 staples were applied to her skin laceration on the left o   Constipation 11/11/2016   Chronic, multifactorial  Miralax  prn  7/22 Postop constipation related to oxycodone  and Robaxin .  We can discontinue oxycodone  Robaxin .  The patient will use tramadol  and Tylenol  instead.  Use MiraLAX  or Senokot as to produce soft regular stools.  Discontinue Pepto-Bismol.  I think this should help with abdominal bloating complaint.      Take Metamucil daily  Senokot as to produce soft regular sto   Contact dermatitis and eczema 06/14/2018   Eczema vs other - 7/19  Cortaid prn  Depo-medrol  IM 80 mg  Remove nail polish     Contusion of left chest wall 01/09/2015   Tranice fell and broke a rib #8 on the L last week, she had a rib X ray and a CT (Dr Jane).   Contusion of right knee 08/12/2016   R knee   Degenerative disc disease, cervical 03/11/2021   Duodenitis 09/03/2008   Essential hypertension, benign 07/27/2011   Chronic, mild  NAS diet  Cardiac CT scan for calcium  scoring offered 1/20  1/24 HTN and s/p ER visit on 12/08/22 for SBP 180. Per Dr Randol:  Discussed with the patient that  her restarting donepezil  could have impacted her symptoms today.  However, her blood pressure continues to be mildly elevated.  Will place her on 2.5 mg amlodipine  to take if her blood pressure is over 170/100.  Nursing aide at   Fatigue 09/08/2014   10/15, 2/16, 9/17, 2/18  No other sx  Vickki is managing it with short rest periods 1-2/d     Fibroid    Gait disorder 02/28/2022   Worse  Multifactorial  Cont w/PT  In the balance class  Balance class q 1 week     [name] resolved after doing exercises by Dr Harvey 11/2021     Generalized anxiety disorder 08/22/2018   Chronic   Lexapro  - not taking  Weighted blanket  Lorazepam  prn, - tolerance has developed   Potential benefits of a long term benzodiazepines  use as well as potential risks  and complications were explained to the patient and were aknowledged.     GERD (gastroesophageal reflux disease)  11/20/2007   Chronic. Better on Gluten free diet  Dexilant    Potential benefits of a long term PPI  use as well as potential risks  and complications were explained to the patient and were aknowledged.  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     History of basal cell carcinoma 09/29/2022   History of colonic polyps 09/04/2008   Hyperlipemia, mixed 03/15/2011   Chronic  On Simvastatin      Hypotension 07/13/2023   Stop Amlodipine      Hypothyroidism    Impacted cerumen of right ear 07/16/2013   Increased frequency of urination 08/19/2014   Inflammation of sacroiliac joint 11/16/2022   Insomnia 01/24/2013   Lorazepam  prn   Potential benefits of a long term benzodiazepines  use as well as potential risks  and complications were explained to the patient and were aknowledged.        Irritable bowel syndrome 09/04/2008   Chronic   Abran MD, Norleen SAILOR  2013 - much better on  gluten free diet              Lactose intolerance 11/20/2007   Lactaid        Lumbar degenerative disc disease 12/21/2022   Based on MRI and her exam there is significant DDD in lumbar spine     Lumbar spondylosis    Melanocytic nevi of trunk 02/22/2022   Meningioma 03/25/2020   Dr Unice     Mild cognitive impairment with memory loss 09/15/2023   Mild intermittent asthma 08/07/2021   Multiple thyroid  nodules 10/04/2011   2013 she was started on Synthroid  - tol well  2017, 2022 US  OK     MVP (mitral valve prolapse)    Antibiotics required for dental procedures   Neck pain 12/26/2018   Chronic  PT, traction, TENs unit was offered  Tramadol  prn   Potential benefits of a long term opioids use as well as potential risks (i.e. addiction risk, apnea etc) and complications (i.e. Somnolence, constipation and others) were explained to the patient and were aknowledged.        OAB (overactive bladder) 09/02/2010   Dr Nicholaus  Use Solifenacin  prn  Worse - ?multifactorial  UA was normal  Camie increased Memantine  to 10 mg bid a month ago     Osteoarthritis 02/25/2008   Dr Bonner  Blue-Emu cream was recommended to use 2-3 times a day        Osteoporosis 11/20/2007   Chronic  Per GYN        Overactive bladder    Pain of right sacroiliac joint 01/14/2023   Postherpetic neuralgia 10/02/2013   Pruritus of skin 03/11/2012   S/p H zoster vaccination  Relapsing  8/12 - on face  8/14 - R ear/face  10/14 ?     Sacroiliitis    Seborrheic dermatitis 02/22/2022   Sinusitis, chronic 08/06/2016   Worse  Flonase , Atrovent  nasal, Singulair , Claritin   ENT ref Dr Thaddeus  CT sinuses     Skin ulcer of nose 07/13/2023   Soft pads on the nose  Derm appt     Spondylolisthesis, cervical region 03/11/2021   Stool incontinence 11/16/2022   12/23  Worse.  Stool leakage -  new  D/c Aricept   Abd X ray to rule out constipation     Tibialis tendinitis 03/20/2008   Torn meniscus    bilateral   Trochanteric bursitis of right hip  09/22/2020   Upper respiratory infection 08/03/2012   9/13 - 3 wks  2/17  11/18 - asthmatic bronchitis     Urinary urgency 08/19/2014   2023 worse.  Obtain urinalysis.  D/c Aricept   UA was ok  On Premarin cream PV per GYN  On Vesicare   F/u w/Dr JONELLE Moats     Urticaria 02/22/2022     Past Surgical History:  Procedure Laterality Date   CATARACT EXTRACTION, BILATERAL     EYE SURGERY     POSTERIOR CERVICAL FUSION/FORAMINOTOMY N/A 05/15/2021   Procedure: Cervical One Laminectomy with resection of cyst, Fixation from Occiput to Cervical Four;  Surgeon: Unice Pac, MD;  Location: [facility] OR;  Service: Neurosurgery;  Laterality: N/A;   TONSILLECTOMY       PREVIOUS MEDICATIONS:   CURRENT MEDICATIONS:  Outpatient Encounter Medications as of 11/29/2023  Medication Sig   acetaminophen  (TYLENOL ) 500 MG tablet Take 1,000 mg by mouth every 6 (six) hours as needed for mild pain.   albuterol  (VENTOLIN  HFA) 108 (90 Base) MCG/ACT inhaler Take 2 puffs every 4-6 hours as needed   amLODipine  (NORVASC ) 2.5 MG tablet Take 2.5 mg by mouth as needed.   Azelastine  HCl 0.15 % SOLN Place into both nostrils. Place into both nostrils.   Calcium  Carbonate-Vitamin D  (CALCIUM  CARBONATE W/VITAMIN D  PO) Take 1 tablet by mouth daily.   cyanocobalamin  (VITAMIN B12) 1000 MCG tablet Take 1 tablet (1,000 mcg total) by mouth daily.   desonide  (DESOWEN ) 0.05 % cream Apply 1 application topically as needed.   diclofenac  Sodium (VOLTAREN ) 1 % GEL APPLY 4 GRAMS 4 TIMES DAILY.   EPINEPHrine  (EPIPEN  2-PAK) 0.3 mg/0.3 mL IJ SOAJ injection Inject 0.3 mLs (0.3 mg total) into the muscle as needed.   famotidine  (PEPCID ) 40 MG tablet Take 1 tablet (40 mg total) by mouth at bedtime. NEEDS OFFICE VISIT FOR ADDITIONAL REFILLS (Patient taking differently: Take 40 mg by mouth as needed. NEEDS OFFICE VISIT FOR ADDITIONAL REFILLS)   hydrocortisone  (ANUSOL -HC) 2.5 % rectal cream Place 1 Application rectally 2 (two) times daily. Insert per rectum    ipratropium (ATROVENT ) 0.06 % nasal spray Place 1 spray into the nose 3 (three) times daily as needed for rhinitis.   levocetirizine (XYZAL ) 5 MG tablet Take 5 mg by mouth every evening.   levothyroxine  (SYNTHROID ) 50 MCG tablet TAKE ONE TABLET BY MOUTH DAILY BEFORE BREAKFAST   loratadine  (CLARITIN ) 10 MG tablet Take 1 tablet (10 mg total) by mouth daily.   LORazepam  (ATIVAN ) 1 MG tablet Take 2-3 tablets at bedtime and 1-2 tablets in the daytime as needed.   memantine  (NAMENDA  XR) 14 MG CP24 24 hr capsule Take 1 capsule (14 mg total) by mouth at bedtime.   methylPREDNISolone  (MEDROL  DOSEPAK) 4 MG TBPK tablet As directed   montelukast  (SINGULAIR ) 10 MG tablet Take 1 tablet (10 mg total) by mouth at bedtime.   oxymetazoline (AFRIN 12 HOUR) 0.05 % nasal spray Place 1 spray into both nostrils as needed.   polyethylene glycol powder (GLYCOLAX /MIRALAX ) 17 GM/SCOOP powder Take 17 g by mouth 2 (two) times daily as needed for moderate constipation.   psyllium (METAMUCIL SMOOTH TEXTURE) 58.6 % powder 1 scoop daily   simvastatin  (ZOCOR ) 40 MG tablet TAKE ONE TABLET ONCE DAILY   solifenacin  (VESICARE ) 10 MG  tablet Take 1 tablet (10 mg total) by mouth daily as needed (bladder). (Patient taking differently: Take 10 mg by mouth at bedtime.)   triamcinolone  (NASACORT ) 55 MCG/ACT AERO nasal inhaler Place 2 sprays into the nose as needed.   triamcinolone  ointment (KENALOG ) 0.1 % Apply 1 Application topically 3 (three) times daily. As needed for itching   [DISCONTINUED] memantine  (NAMENDA ) 10 MG tablet Take 1 tablet (10 mg total) by mouth 2 (two) times daily. (Patient taking differently: Take 10 mg by mouth daily.)   erythromycin  ophthalmic ointment Place 1 Application into both eyes at bedtime. (Patient not taking: Reported on 11/29/2023)   Gauze Pads & Dressings (TELFA ADHESIVE DRESSING) 3X4 PADS Use qd (Patient not taking: Reported on 11/29/2023)   neomycin -polymyxin-hydrocortisone  (CORTISPORIN) OTIC solution  Place 3 drops into both ears 3 (three) times daily. (Patient not taking: Reported on 11/29/2023)   pantoprazole  (PROTONIX ) 40 MG tablet Take 40 mg by mouth 2 (two) times daily. (Patient not taking: Reported on 11/29/2023)   prednisoLONE acetate (PRED FORTE) 1 % ophthalmic suspension 1 drop 3 (three) times daily. (Patient not taking: Reported on 11/29/2023)   predniSONE  (DELTASONE ) 20 MG tablet Take 1 tablet (20 mg total) by mouth 2 (two) times daily. (Patient not taking: Reported on 11/29/2023)   traMADol  (ULTRAM ) 50 MG tablet Take 1 tablet (50 mg total) by mouth every 6 (six) hours as needed for severe pain. (Patient not taking: Reported on 11/29/2023)   No facility-administered encounter medications on file as of 11/29/2023.     Objective:     PHYSICAL EXAMINATION:    VITALS:   Vitals:   11/29/23 1115  BP: 130/76  Pulse: 92  SpO2: 95%  Weight: 113 lb (51.3 kg)  Height: 5' (1.524 m)    GEN:  The patient appears stated age and is in NAD. HEENT:  Normocephalic, atraumatic.   Neurological examination:  General: NAD, well-groomed, appears stated age. Orientation: The patient is alert. Oriented to person, place and date Cranial nerves: There is good facial symmetry.The speech is fluent and clear. No aphasia or dysarthria. Fund of knowledge is appropriate. Recent  and remote memory is impaired.  Attention and concentration are reduced.   Able to name objects and repeat phrases.  Hearing is intact to conversational tone .    Sensation: Sensation is intact to light touch throughout Motor: Strength is at least antigravity x4. DTR's 2/4 in UE/LE      11/08/2022    7:00 PM  Montreal Cognitive Assessment   Visuospatial/ Executive (0/5) 1  Naming (0/3) 1  Attention: Read list of digits (0/2) 2  Attention: Read list of letters (0/1) 1  Attention: Serial 7 subtraction starting at 100 (0/3) 3  Language: Repeat phrase (0/2) 2  Language : Fluency (0/1) 1  Abstraction (0/2) 1  Delayed  Recall (0/5) 3  Orientation (0/6) 6  Total 21  Adjusted Score (based on education) 22       02/10/2017    1:39 PM  MMSE - Mini Mental State Exam  Orientation to time 5  Orientation to Place 5  Registration 3  Attention/ Calculation 5  Recall 2  Language- name 2 objects 2  Language- repeat 1  Language- follow 3 step command 3  Language- read & follow direction 1  Write a sentence 1  Copy design 1  Total score 29       Movement examination: Tone: There is normal tone in the UE/LE Abnormal movements:  no tremor.  No  myoclonus.  No asterixis.   Coordination:  There is no decremation with RAM's. Normal finger to nose  Gait and Station: The patient has no difficulty arising out of a deep-seated chair without the use of the hands. The patient's stride length is good.  Gait is cautious and narrow.   Thank you for allowing us  the opportunity to participate in the care of this nice patient. Please do not hesitate to contact us  for any questions or concerns.   Total time spent on today's visit was 40 minutes dedicated to this patient today, preparing to see patient, examining the patient, ordering tests and/or medications and counseling the patient, documenting clinical information in the EHR or other health record, independently interpreting results and communicating results to the patient/family, discussing treatment and goals, answering patient's questions and coordinating care.  Cc:  Plotnikov, Karlynn GAILS, MD  Camie Sevin 11/29/2023 12:39 PM

## 2023-12-01 ENCOUNTER — Encounter: Payer: Self-pay | Admitting: Internal Medicine

## 2023-12-05 DIAGNOSIS — D2339 Other benign neoplasm of skin of other parts of face: Secondary | ICD-10-CM | POA: Diagnosis not present

## 2023-12-05 DIAGNOSIS — L570 Actinic keratosis: Secondary | ICD-10-CM | POA: Diagnosis not present

## 2023-12-05 DIAGNOSIS — D485 Neoplasm of uncertain behavior of skin: Secondary | ICD-10-CM | POA: Diagnosis not present

## 2023-12-06 DIAGNOSIS — R351 Nocturia: Secondary | ICD-10-CM | POA: Diagnosis not present

## 2023-12-06 DIAGNOSIS — K5900 Constipation, unspecified: Secondary | ICD-10-CM | POA: Diagnosis not present

## 2023-12-06 DIAGNOSIS — R3915 Urgency of urination: Secondary | ICD-10-CM | POA: Diagnosis not present

## 2023-12-06 DIAGNOSIS — N3281 Overactive bladder: Secondary | ICD-10-CM | POA: Diagnosis not present

## 2023-12-06 DIAGNOSIS — R350 Frequency of micturition: Secondary | ICD-10-CM | POA: Diagnosis not present

## 2023-12-07 DIAGNOSIS — M542 Cervicalgia: Secondary | ICD-10-CM | POA: Diagnosis not present

## 2023-12-08 ENCOUNTER — Ambulatory Visit: Payer: Medicare Other | Admitting: Physician Assistant

## 2023-12-08 ENCOUNTER — Ambulatory Visit (INDEPENDENT_AMBULATORY_CARE_PROVIDER_SITE_OTHER): Payer: Medicare Other | Admitting: Sports Medicine

## 2023-12-08 VITALS — BP 146/74 | Ht 60.0 in

## 2023-12-08 DIAGNOSIS — M5030 Other cervical disc degeneration, unspecified cervical region: Secondary | ICD-10-CM

## 2023-12-08 MED ORDER — TRAMADOL HCL 50 MG PO TABS: 50.0000 mg | ORAL_TABLET | Freq: Two times a day (BID) | ORAL | 2 refills | Status: DC

## 2023-12-08 NOTE — Progress Notes (Signed)
 CC: neck pain radiating to fingers and trapezius  Patient has a history of unstable degenerative disk and arthritis changes to her neck.  Dr. Unice ultimately had to fuse her upper cervical spine to her skull to stabilize the spine and lessen her symptoms in June 2022.  She had to use tylenol  and tramadol  to control pain at first.  However, after acute phase she generally uses only tylenol .  She has had some periodic pain but recently starting get a sharp pain that radiates to her RT trapezius, RT shoulder and sometimes radiates to hand and fingers.  On some episodes this is burning and tingling even into her fingernails. She has at some point in past required gabapentin  for radicular sxs but does not recall dates. She did get relief with tramadol  early after her fusion procedure.  Yesterday saw neurosurgery who noted some screw fractures but no instability from her cervical fusion.  Trial of trigger point injections to trapezius did not relieve pain.  PE Pleasant elderly F in NAD who is alert and well oriented. BP (!) 146/74   Ht 5' (1.524 m)   BMI 22.07 kg/m   Neck shows no extension from resting position. Minimal rotation or flexion No lateral bending.  ROM of right shoulder is equal to left and she can get to 160 deg of elevation and forward flexion of 140.  No abnormal neuro testing or pain with testing RT upper extremity.  I reviewed her cervical spine films and noted that in addition ot upper fusion she has minimal disk space left at C4/5 and C5/6 with generalized changes throughout cervical spine.  Lower foramina are narrow RT and Lt.

## 2023-12-08 NOTE — Patient Instructions (Addendum)
 It was great to see you today!  We've sent in tramadol  for you today. You have been on this before so you should tolerate it without any issues. If you are having any adverse side effects from it please call us . Take the tramadol  as follows: -A.M dose: take 2 tylenol  and 1 tramadol  -P.M (around 6 pm) dose: take 2 tylenol  and 1 tramadol  This is safer for you to take than gabapentin . We are treating the radicular pain you are having from the previous cervical fusion you had that has a fracture in the screw. You also have degenerative disc disease of your cervical spine that is worse at C4-5 and C5-6 level with noticeable disc loss.

## 2023-12-08 NOTE — Assessment & Plan Note (Signed)
 She now has sharp pain and intermittent radicular pain worse in C4/5 and C5/6 distribution.  This is well below level of her cervical stabilization.  I think the most conservative approach would be to use some tramadol  with her tylenol  to see if se can control her pain.  Is unsuccesful I think I need to try gabapentin  treatment for her again with caution about sedation and falling.  Tramadol  bid/ tylenol  bid to qid  Reck 1 month

## 2023-12-12 ENCOUNTER — Other Ambulatory Visit: Payer: Medicare Other

## 2023-12-13 ENCOUNTER — Other Ambulatory Visit (INDEPENDENT_AMBULATORY_CARE_PROVIDER_SITE_OTHER): Payer: Medicare Other

## 2023-12-13 DIAGNOSIS — I10 Essential (primary) hypertension: Secondary | ICD-10-CM | POA: Diagnosis not present

## 2023-12-13 DIAGNOSIS — G4700 Insomnia, unspecified: Secondary | ICD-10-CM

## 2023-12-13 LAB — LIPID PANEL
Cholesterol: 228 mg/dL — ABNORMAL HIGH (ref 0–200)
HDL: 51.8 mg/dL (ref >39.00)
LDL Cholesterol: 144 mg/dL — ABNORMAL HIGH (ref 0–99)
NonHDL: 176.06
Total CHOL/HDL Ratio: 4
Triglycerides: 159 mg/dL — ABNORMAL HIGH (ref 0.0–149.0)
VLDL: 31.8 mg/dL (ref 0.0–40.0)

## 2023-12-13 LAB — COMPREHENSIVE METABOLIC PANEL
ALT: 17 U/L (ref 0–35)
AST: 17 U/L (ref 0–37)
Albumin: 4.2 g/dL (ref 3.5–5.2)
Alkaline Phosphatase: 29 U/L — ABNORMAL LOW (ref 39–117)
BUN: 14 mg/dL (ref 6–23)
CO2: 26 meq/L (ref 19–32)
Calcium: 9.3 mg/dL (ref 8.4–10.5)
Chloride: 98 meq/L (ref 96–112)
Creatinine, Ser: 0.76 mg/dL (ref 0.40–1.20)
GFR: 71.44 mL/min (ref >60.00)
Glucose, Bld: 89 mg/dL (ref 70–99)
Potassium: 3.7 meq/L (ref 3.5–5.1)
Sodium: 134 meq/L — ABNORMAL LOW (ref 135–145)
Total Bilirubin: 0.5 mg/dL (ref 0.2–1.2)
Total Protein: 7.1 g/dL (ref 6.0–8.3)

## 2023-12-14 DIAGNOSIS — H0015 Chalazion left lower eyelid: Secondary | ICD-10-CM | POA: Diagnosis not present

## 2023-12-14 NOTE — Telephone Encounter (Signed)
 Copied from CRM [id number]. Topic: Clinical - Medical Advice >> Dec 14, 2023 11:40 AM Artemio Larry wrote: Reason for CRM: Patient having a lot of neck pain, says Dr. Georgia Kipper recommended a shot in muscle area if pain continues. Patient request call back to discuss.

## 2023-12-18 ENCOUNTER — Encounter: Payer: Self-pay | Admitting: Internal Medicine

## 2023-12-20 ENCOUNTER — Encounter: Payer: Self-pay | Admitting: Family Medicine

## 2023-12-20 ENCOUNTER — Ambulatory Visit (INDEPENDENT_AMBULATORY_CARE_PROVIDER_SITE_OTHER): Payer: Medicare Other | Admitting: Family Medicine

## 2023-12-20 VITALS — BP 138/73 | Ht 60.0 in | Wt 113.0 lb

## 2023-12-20 DIAGNOSIS — M5030 Other cervical disc degeneration, unspecified cervical region: Secondary | ICD-10-CM | POA: Diagnosis not present

## 2023-12-20 DIAGNOSIS — M542 Cervicalgia: Secondary | ICD-10-CM | POA: Diagnosis not present

## 2023-12-20 NOTE — Progress Notes (Signed)
PCP: Plotnikov, Georgina Quint, MD  Chief Complaint: f/u radicular pain right  Subjective:   HPI: Patient is a 88 y.o. female here for follow-up of right sided radicular pain.  Patient was seen approximately 2 and half weeks ago and at that time plan was made to start taking tramadol and Tylenol in the morning for some pain relief.  Patient states that her kids had a discussion with her and felt that he was unsafe to take the medication with some of her other medications due to interactions.  Patient not been taking the tramadol and states that because of that the pain has been worse so she decided to make an appointment to be seen sooner to see if there is something else to discuss.  Patient has been on tramadol and gabapentin in the past after previous cervical surgery.  Past Medical History:  Diagnosis Date   Allergic rhinitis due to animal (cat) (dog) hair and dander 08/07/2021   Allergic rhinitis due to pollen 08/07/2021   Aortic valve sclerosis 03/12/2015   Atrophic gastritis 09/03/2008   Atrophic vaginitis    Baker's cyst    Left-Dr. Aluisio   Bladder pain 08/19/2014   Chronic  Take Vesicare 10 mg/d  UA was ok  On Premarin cream PV per GYN  On Vesicare  F/u w/Dr Monico Blitz     Cataract    Dr. Dione Booze   Cervical spinal cord compression 05/04/2021   Cervical spine instability 05/15/2021   Dr. Venetia Maxon: plan -  posterior C1-2 decompression and instrumented fusion.  MRI: Progressive soft tissue pannus at C1-2 is now creating mass  effect on the craniocervical junction with distortion of the upper  spinal cord. This is likely related rheumatoid arthritis  C spine CT: Fracture of the anterior arch of C1 with 12 mm separation. Nondisplaced fracture posterior arch of C1. There is later   Cervical spondylosis 01/05/2019   Chalazion left upper eyelid 04/18/2023   X2  Keflex po - too big... Will use Ceftin  F/u w/Ophthalmology   CHF (congestive heart failure)    Cholelithiasis 09/09/2019   Per Dr Marina Goodell:  "At this point we mutually decided to watch her abdominal complaints.  If symptoms accelerate or become more classic for symptomatic cholelithiasis, then she is agreeable to surgical referral.  She will keep me posted"   Chronic left SI joint pain 11/30/2022   Closed fracture of first cervical vertebra 08/07/2021   Closed fracture of second cervical vertebra 08/07/2021   Concussion 10/29/2021   s/p a hard fall after she rushed and slipped on the wet marble floor while visiting family in [place] a couple week ago.  She hit the side of her skull, had a skin laceration bled quite a bit.  She was dazed and according to her son Lorin Picket had a loss of consciousness of under a minute duration.  She felt dazed.  She was taken to ER.  2 staples were applied to her skin laceration on the left o   Constipation 11/11/2016   Chronic, multifactorial  Miralax prn  7/22 Postop constipation related to oxycodone and Robaxin.  We can discontinue oxycodone Robaxin.  The patient will use tramadol and Tylenol instead.  Use MiraLAX or Senokot as to produce soft regular stools.  Discontinue Pepto-Bismol.  I think this should help with abdominal bloating complaint.      Take Metamucil daily  Senokot as to produce soft regular sto   Contact dermatitis and eczema 06/14/2018   Eczema vs  other - 7/19  Cortaid prn  Depo-medrol IM 80 mg  Remove nail polish     Contusion of left chest wall 01/09/2015   Karena Addison fell and broke a rib #8 on the L last week, she had a rib X ray and a CT (Dr Thurston Hole).   Contusion of right knee 08/12/2016   R knee   Degenerative disc disease, cervical 03/11/2021   Duodenitis 09/03/2008   Essential hypertension, benign 07/27/2011   Chronic, mild  NAS diet  Cardiac CT scan for calcium scoring offered 1/20  1/24 HTN and s/p ER visit on 12/08/22 for SBP 180. Per Dr Lynelle Doctor: " Discussed with the patient that her restarting donepezil could have impacted her symptoms today.  However, her blood pressure continues to be  mildly elevated.  Will place her on 2.5 mg amlodipine to take if her blood pressure is over 170/100.  Nursing aide at   Fatigue 09/08/2014   10/15, 2/16, 9/17, 2/18  No other sx  Doneta is managing it with short rest periods 1-2/d     Fibroid    Gait disorder 02/28/2022   Worse  Multifactorial  Cont w/PT  In the balance class  Balance class q 1 week     [name] resolved after doing exercises by Dr Darrick Penna 11/2021     Generalized anxiety disorder 08/22/2018   Chronic   Lexapro - not taking  Weighted blanket  Lorazepam prn, - tolerance has developed   Potential benefits of a long term benzodiazepines  use as well as potential risks  and complications were explained to the patient and were aknowledged.     GERD (gastroesophageal reflux disease) 11/20/2007   Chronic. Better on Gluten free diet  Dexilant   Potential benefits of a long term PPI  use as well as potential risks  and complications were explained to the patient and were aknowledged.  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     History of basal cell carcinoma 09/29/2022   History of colonic polyps 09/04/2008   Hyperlipemia, mixed 03/15/2011   Chronic  On Simvastatin     Hypotension 07/13/2023   Stop Amlodipine     Hypothyroidism    Impacted cerumen of right ear 07/16/2013   Increased frequency of urination 08/19/2014   Inflammation of sacroiliac joint 11/16/2022   Insomnia 01/24/2013   Lorazepam prn   Potential benefits of a long term benzodiazepines  use as well as potential risks  and complications were explained to the patient and were aknowledged.        Irritable bowel syndrome 09/04/2008   Chronic   Marina Goodell MD, Wilhemina Bonito  2013 - much better on gluten free diet              Lactose intolerance 11/20/2007   Lactaid        Lumbar degenerative disc disease  12/21/2022   Based on MRI and her exam there is significant DDD in lumbar spine     Lumbar spondylosis    Melanocytic nevi of trunk 02/22/2022   Meningioma 03/25/2020   Dr Venetia Maxon     Mild cognitive impairment with memory loss 09/15/2023   Mild intermittent asthma 08/07/2021   Multiple  thyroid nodules 10/04/2011   2013 she was started on Synthroid - tol well  2017, 2022 [place]     MVP (mitral valve prolapse)    Antibiotics required for dental procedures   Neck pain 12/26/2018   Chronic  PT, traction, TENs unit was offered  Tramadol prn   Potential benefits of a long term opioids use as well as potential risks (i.e. addiction risk, apnea etc) and complications (i.e. Somnolence, constipation and others) were explained to the patient and were aknowledged.        OAB (overactive bladder) 09/02/2010   Dr Earlene Plater  Use Solifenacin prn  Worse - ?multifactorial  UA was normal  [place] Dec increased Memantine to 10 mg bid a month ago     Osteoarthritis 02/25/2008   Dr Ethelene Hal  Blue-Emu cream was recommended to use 2-3 times a day        Osteoporosis 11/20/2007   Chronic  Per GYN        Overactive bladder    Pain of right sacroiliac joint 01/14/2023   Postherpetic neuralgia 10/02/2013   Pruritus of skin 03/11/2012   S/p H zoster vaccination  Relapsing  8/12 - on face  8/14 - R ear/face  10/14 ?     Sacroiliitis    Seborrheic dermatitis 02/22/2022   Sinusitis, chronic 08/06/2016   Worse  Flonase, Atrovent nasal, Singulair, Claritin  ENT ref Dr Dorma Russell  CT sinuses     Skin ulcer of nose 07/13/2023   Soft pads on the nose  Derm appt     Spondylolisthesis, cervical region 03/11/2021   Stool incontinence 11/16/2022   12/23  Worse.  Stool leakage - new  D/c Aricept  Abd X ray to rule out constipation     Tibialis tendinitis 03/20/2008   Torn meniscus    bilateral   Trochanteric bursitis of right hip 09/22/2020   Upper respiratory infection 08/03/2012   9/13 - 3 wks  2/17  11/18 - asthmatic bronchitis      Urinary urgency 08/19/2014   2023 worse.  Obtain urinalysis.  D/c Aricept  UA was ok  On Premarin cream PV per GYN  On Vesicare  F/u w/Dr Monico Blitz     Urticaria 02/22/2022    Current Outpatient Medications on File Prior to Visit  Medication Sig Dispense Refill   acetaminophen (TYLENOL) 500 MG tablet Take 1,000 mg by mouth every 6 (six) hours as needed for mild pain.     albuterol (VENTOLIN HFA) 108 (90 Base) MCG/ACT inhaler Take 2 puffs every 4-6 hours as needed     amLODipine (NORVASC) 2.5 MG tablet Take 2.5 mg by mouth as needed.     Azelastine HCl 0.15 % SOLN Place into both nostrils. Place into both nostrils.     Calcium Carbonate-Vitamin D (CALCIUM CARBONATE W/VITAMIN D PO) Take 1 tablet by mouth daily.     cyanocobalamin (VITAMIN B12) 1000 MCG tablet Take 1 tablet (1,000 mcg total) by mouth daily. 100 tablet 3   desonide (DESOWEN) 0.05 % cream Apply 1 application topically as needed.     diclofenac Sodium (VOLTAREN) 1 % GEL APPLY 4 GRAMS 4 TIMES DAILY. 200 g 3   EPINEPHrine (EPIPEN 2-PAK) 0.3 mg/0.3 mL IJ SOAJ injection Inject 0.3 mLs (0.3 mg total) into the muscle as needed. 1 Device 1   erythromycin ophthalmic ointment Place 1 Application into both eyes at bedtime. (Patient not taking: Reported on 11/29/2023) 3.5 g 0   famotidine (PEPCID) 40 MG tablet Take  1 tablet (40 mg total) by mouth at bedtime. NEEDS OFFICE VISIT FOR ADDITIONAL REFILLS (Patient taking differently: Take 40 mg by mouth as needed. NEEDS OFFICE VISIT FOR ADDITIONAL REFILLS) 30 tablet 2   Gauze Pads & Dressings (TELFA ADHESIVE DRESSING) 3"X4" PADS Use qd (Patient not taking: Reported on 11/29/2023) 20 each 0   hydrocortisone (ANUSOL-HC) 2.5 % rectal cream Place 1 Application rectally 2 (two) times daily. Insert per rectum 14 g 1   ipratropium (ATROVENT) 0.06 % nasal spray Place 1 spray into the nose 3 (three) times daily as needed for rhinitis. 15 mL 0   levocetirizine (XYZAL) 5 MG tablet Take 5 mg by mouth every evening.      levothyroxine (SYNTHROID) 50 MCG tablet TAKE ONE TABLET BY MOUTH DAILY BEFORE BREAKFAST 30 tablet 5   loratadine (CLARITIN) 10 MG tablet Take 1 tablet (10 mg total) by mouth daily. 100 tablet 3   LORazepam (ATIVAN) 1 MG tablet Take 2-3 tablets at bedtime and 1-2 tablets in the daytime as needed. 150 tablet 2   memantine (NAMENDA XR) 14 MG CP24 24 hr capsule Take 1 capsule (14 mg total) by mouth at bedtime. 90 capsule 11   methylPREDNISolone (MEDROL DOSEPAK) 4 MG TBPK tablet As directed 21 tablet 0   montelukast (SINGULAIR) 10 MG tablet Take 1 tablet (10 mg total) by mouth at bedtime. 90 tablet 1   neomycin-polymyxin-hydrocortisone (CORTISPORIN) OTIC solution Place 3 drops into both ears 3 (three) times daily. (Patient not taking: Reported on 11/29/2023) 10 mL 0   oxymetazoline (AFRIN 12 HOUR) 0.05 % nasal spray Place 1 spray into both nostrils as needed.     pantoprazole (PROTONIX) 40 MG tablet Take 40 mg by mouth 2 (two) times daily. (Patient not taking: Reported on 11/29/2023)     polyethylene glycol powder (GLYCOLAX/MIRALAX) 17 GM/SCOOP powder Take 17 g by mouth 2 (two) times daily as needed for moderate constipation. 500 g 3   prednisoLONE acetate (PRED FORTE) 1 % ophthalmic suspension 1 drop 3 (three) times daily. (Patient not taking: Reported on 11/29/2023)     predniSONE (DELTASONE) 20 MG tablet Take 1 tablet (20 mg total) by mouth 2 (two) times daily. (Patient not taking: Reported on 11/29/2023) 10 tablet 0   psyllium (METAMUCIL SMOOTH TEXTURE) 58.6 % powder 1 scoop daily 283 g 6   simvastatin (ZOCOR) 40 MG tablet TAKE ONE TABLET ONCE DAILY 90 tablet 3   solifenacin (VESICARE) 10 MG tablet Take 1 tablet (10 mg total) by mouth daily as needed (bladder). (Patient taking differently: Take 10 mg by mouth at bedtime.) 90 tablet 3   traMADol (ULTRAM) 50 MG tablet Take 1 tablet (50 mg total) by mouth every 6 (six) hours as needed for severe pain. (Patient not taking: Reported on 11/29/2023) 120  tablet 2   traMADol (ULTRAM) 50 MG tablet Take 1 tablet (50 mg total) by mouth 2 (two) times daily. 60 tablet 2   triamcinolone (NASACORT) 55 MCG/ACT AERO nasal inhaler Place 2 sprays into the nose as needed.     triamcinolone ointment (KENALOG) 0.1 % Apply 1 Application topically 3 (three) times daily. As needed for itching 80 g 2   No current facility-administered medications on file prior to visit.    Past Surgical History:  Procedure Laterality Date   CATARACT EXTRACTION, BILATERAL     EYE SURGERY     POSTERIOR CERVICAL FUSION/FORAMINOTOMY N/A 05/15/2021   Procedure: Cervical One Laminectomy with resection of cyst, Fixation from Occiput to Cervical  Four;  Surgeon: [name], MD;  Location[facility] OR;  Service: Neurosurgery;  Laterality: N/A;   TONSILLECTOMY      Allergies  Allergen Reactions   Bee Venom Itching, Swelling and Rash    Itching, swelling and rash with bee stings, patient has epi pen   Bacitracin-Polymyxin B Other (See Comments)   Clarithromycin Other (See Comments)   Doxycycline     ??   Neosporin [Neomycin-Bacitracin Zn-Polymyx]    Hydrocortisone Rash    BP 138/73   Ht 5' (1.524 m)   Wt 113 lb (51.3 kg)   BMI 22.07 kg/m       No data to display              No data to display              Objective:  Physical Exam:  Gen: NAD, comfortable in exam room  Cervical spine:   - Inspection: no gross deformity or asymmetry, swelling or ecchymosis. No skin changes.  - Palpation: No TTP over the spinous processes, there is paraspinal muscles on the right   - ROM: Range of motion of the right shoulder is equal to the left, patient has difficulty with neck extension.  There are some pain with neck flexion.  No lateral bending.  - Strength: 5/5 strength of upper extremity in C5-T1 nerve root distributions b/l  *C5: Shoulder abduction  *C6: Elbow flexion, Wrist extension  *C7: Elbow extension  *C8: Thumb extension, Wrist Ulnar deviation  *T1: Finger  abduction    - Neuro: sensation intact in the C5-T1 nerve root distribution b/l   Assessment & Plan:  1. 1. Degenerative disc disease, cervical (Primary) -I had a lengthy discussion with patient and patient's son regarding use of tramadol.  Given that patient has been on tramadol in the past and will have help at home, patient will trial short course of tramadol for pain relief.  Patient has a standing an appointment with Dr. Darrick Penna in 2 weeks, patient will start taking 1 tramadol a day and if she finds that that is not sufficient will increase to 2 a day.  In 2 weeks, will let Dr. Ashley Mariner if there has been any improvement in her pain.  If not, will consider trialing gabapentin again at that time for the neuropathy.  Both patient and son understanding and agreeable with plan.   Brenton Grills MD, PGY-4  Sports Medicine Fellow [facility]

## 2023-12-21 ENCOUNTER — Ambulatory Visit: Payer: Medicare Other | Admitting: Internal Medicine

## 2023-12-21 ENCOUNTER — Encounter: Payer: Self-pay | Admitting: Internal Medicine

## 2023-12-21 VITALS — BP 120/70 | HR 91 | Temp 98.0°F | Ht 60.0 in | Wt 113.0 lb

## 2023-12-21 DIAGNOSIS — R21 Rash and other nonspecific skin eruption: Secondary | ICD-10-CM

## 2023-12-21 DIAGNOSIS — M542 Cervicalgia: Secondary | ICD-10-CM

## 2023-12-21 DIAGNOSIS — R269 Unspecified abnormalities of gait and mobility: Secondary | ICD-10-CM

## 2023-12-21 DIAGNOSIS — M532X2 Spinal instabilities, cervical region: Secondary | ICD-10-CM | POA: Diagnosis not present

## 2023-12-21 DIAGNOSIS — G3184 Mild cognitive impairment, so stated: Secondary | ICD-10-CM

## 2023-12-21 MED ORDER — TRIAMCINOLONE ACETONIDE 0.1 % EX OINT: 1.0000 | TOPICAL_OINTMENT | Freq: Three times a day (TID) | CUTANEOUS | 2 refills | Status: DC

## 2023-12-21 MED ORDER — TRAMADOL HCL 50 MG PO TABS: 50.0000 mg | ORAL_TABLET | Freq: Two times a day (BID) | ORAL | 2 refills | Status: DC

## 2023-12-21 NOTE — Assessment & Plan Note (Addendum)
Neck and R shoulder pain  Tramadol prn (OK w/pt and family to use)  Potential benefits of a long term opioids use as well as potential risks (i.e. addiction risk, apnea etc) and complications (i.e. Somnolence, constipation and others) were explained to the patient and were aknowledged. Voltaren gel - d/c Use Trolamine X ray C spine

## 2023-12-21 NOTE — Progress Notes (Signed)
Subjective:  Patient ID: [name], female    DOB: 1936-01-06  Age: 88 y.o. MRN: [id number]  CC: Medical Management of Chronic Issues (3 MNTH F/U)   HPI [name] presents for R arm, shoulder pain Neck pain     Outpatient Medications Prior to Visit  Medication Sig Dispense Refill   acetaminophen (TYLENOL) 500 MG tablet Take 1,000 mg by mouth every 6 (six) hours as needed for mild pain.     albuterol (VENTOLIN HFA) 108 (90 Base) MCG/ACT inhaler Take 2 puffs every 4-6 hours as needed     amLODipine (NORVASC) 2.5 MG tablet Take 2.5 mg by mouth as needed.     Azelastine HCl 0.15 % SOLN Place into both nostrils. Place into both nostrils.     Calcium Carbonate-Vitamin D (CALCIUM CARBONATE W/VITAMIN D PO) Take 1 tablet by mouth daily.     cyanocobalamin (VITAMIN B12) 1000 MCG tablet Take 1 tablet (1,000 mcg total) by mouth daily. 100 tablet 3   desonide (DESOWEN) 0.05 % cream Apply 1 application topically as needed.     EPINEPHrine (EPIPEN 2-PAK) 0.3 mg/0.3 mL IJ SOAJ injection Inject 0.3 mLs (0.3 mg total) into the muscle as needed. 1 Device 1   levocetirizine (XYZAL) 5 MG tablet Take 5 mg by mouth every evening.     levothyroxine (SYNTHROID) 50 MCG tablet TAKE ONE TABLET BY MOUTH DAILY BEFORE BREAKFAST 30 tablet 5   loratadine (CLARITIN) 10 MG tablet Take 1 tablet (10 mg total) by mouth daily. 100 tablet 3   LORazepam (ATIVAN) 1 MG tablet Take 2-3 tablets at bedtime and 1-2 tablets in the daytime as needed. 150 tablet 2   memantine (NAMENDA XR) 14 MG CP24 24 hr capsule Take 1 capsule (14 mg total) by mouth at bedtime. 90 capsule 11   montelukast (SINGULAIR) 10 MG tablet Take 1 tablet (10 mg total) by mouth at bedtime. 90 tablet 1   oxymetazoline (AFRIN 12 HOUR) 0.05 % nasal spray Place 1 spray into both nostrils as needed.     polyethylene glycol powder (GLYCOLAX/MIRALAX) 17 GM/SCOOP powder Take 17 g by mouth 2 (two) times daily as needed for moderate constipation. 500 g 3    solifenacin (VESICARE) 10 MG tablet Take 1 tablet (10 mg total) by mouth daily as needed (bladder). 90 tablet 3   triamcinolone (NASACORT) 55 MCG/ACT AERO nasal inhaler Place 2 sprays into the nose as needed.     diclofenac Sodium (VOLTAREN) 1 % GEL APPLY 4 GRAMS 4 TIMES DAILY. 200 g 3   famotidine (PEPCID) 40 MG tablet Take 1 tablet (40 mg total) by mouth at bedtime. NEEDS OFFICE VISIT FOR ADDITIONAL REFILLS (Patient taking differently: Take 40 mg by mouth as needed. NEEDS OFFICE VISIT FOR ADDITIONAL REFILLS) 30 tablet 2   Gauze Pads & Dressings (TELFA ADHESIVE DRESSING) 3"X4" PADS Use qd 20 each 0   hydrocortisone (ANUSOL-HC) 2.5 % rectal cream Place 1 Application rectally 2 (two) times daily. Insert per rectum 14 g 1   ipratropium (ATROVENT) 0.06 % nasal spray Place 1 spray into the nose 3 (three) times daily as needed for rhinitis. 15 mL 0   methylPREDNISolone (MEDROL DOSEPAK) 4 MG TBPK tablet As directed 21 tablet 0   psyllium (METAMUCIL SMOOTH TEXTURE) 58.6 % powder 1 scoop daily 283 g 6   simvastatin (ZOCOR) 40 MG tablet TAKE ONE TABLET ONCE DAILY 90 tablet 3   traMADol (ULTRAM) 50 MG tablet Take 1 tablet (50 mg total) by  mouth 2 (two) times daily. 60 tablet 2   triamcinolone ointment (KENALOG) 0.1 % Apply 1 Application topically 3 (three) times daily. As needed for itching 80 g 2   neomycin-polymyxin-hydrocortisone (CORTISPORIN) OTIC solution Place 3 drops into both ears 3 (three) times daily. (Patient not taking: Reported on 11/15/2023) 10 mL 0   erythromycin ophthalmic ointment Place 1 Application into both eyes at bedtime. (Patient not taking: Reported on 11/15/2023) 3.5 g 0   pantoprazole (PROTONIX) 40 MG tablet Take 40 mg by mouth 2 (two) times daily. (Patient not taking: Reported on 12/21/2023)     prednisoLONE acetate (PRED FORTE) 1 % ophthalmic suspension 1 drop 3 (three) times daily. (Patient not taking: Reported on 11/15/2023)     predniSONE (DELTASONE) 20 MG tablet Take 1 tablet (20  mg total) by mouth 2 (two) times daily. (Patient not taking: Reported on 11/15/2023) 10 tablet 0   traMADol (ULTRAM) 50 MG tablet Take 1 tablet (50 mg total) by mouth every 6 (six) hours as needed for severe pain. (Patient not taking: Reported on 12/21/2023) 120 tablet 2   No facility-administered medications prior to visit.    ROS: Review of Systems  Constitutional:  Positive for fatigue. Negative for activity change, appetite change, chills and unexpected weight change.  HENT:  Negative for congestion, mouth sores and sinus pressure.   Eyes:  Negative for visual disturbance.  Respiratory:  Negative for cough and chest tightness.   Gastrointestinal:  Negative for abdominal pain and nausea.  Genitourinary:  Negative for difficulty urinating, frequency and vaginal pain.  Musculoskeletal:  Positive for arthralgias, back pain, gait problem, myalgias, neck pain and neck stiffness. Negative for joint swelling.  Skin:  Negative for pallor and rash.  Neurological:  Negative for dizziness, tremors, weakness, numbness and headaches.  Hematological:  Bruises/bleeds easily.  Psychiatric/Behavioral:  Positive for decreased concentration. Negative for confusion, sleep disturbance and suicidal ideas. The patient is nervous/anxious.     Objective:  BP 120/70 (BP Location: Left Arm, Patient Position: Sitting, Cuff Size: Normal)   Pulse 91   Temp 98 F (36.7 C) (Oral)   Ht 5' (1.524 m)   Wt 113 lb (51.3 kg)   SpO2 92%   BMI 22.07 kg/m   BP Readings from Last 3 Encounters:  01/09/24 120/70  01/03/24 132/60  01/02/24 (!) 140/70    Wt Readings from Last 3 Encounters:  01/09/24 113 lb (51.3 kg)  01/03/24 113 lb (51.3 kg)  01/02/24 115 lb 3.2 oz (52.3 kg)    Physical Exam Constitutional:      General: She is not in acute distress.    Appearance: Normal appearance. She is well-developed.  HENT:     Head: Normocephalic.     Right Ear: External ear normal.     Left Ear: External ear normal.      Nose: Nose normal.  Eyes:     General:        Right eye: No discharge.        Left eye: No discharge.     Conjunctiva/sclera: Conjunctivae normal.     Pupils: Pupils are equal, round, and reactive to light.  Neck:     Thyroid: No thyromegaly.     Vascular: No JVD.     Trachea: No tracheal deviation.  Cardiovascular:     Rate and Rhythm: Normal rate and regular rhythm.     Heart sounds: Normal heart sounds.  Pulmonary:     Effort: No respiratory distress.  Breath sounds: No stridor. No wheezing.  Abdominal:     General: Bowel sounds are normal. There is no distension.     Palpations: Abdomen is soft. There is no mass.     Tenderness: There is no abdominal tenderness. There is no guarding or rebound.  Musculoskeletal:        General: Tenderness present.     Cervical back: Normal range of motion and neck supple. No rigidity.  Lymphadenopathy:     Cervical: No cervical adenopathy.  Skin:    Findings: No erythema or rash.  Neurological:     Mental Status: Mental status is at baseline.     Cranial Nerves: No cranial nerve deficit.     Motor: No abnormal muscle tone.     Coordination: Coordination abnormal.     Gait: Gait abnormal.     Deep Tendon Reflexes: Reflexes normal.  Psychiatric:        Behavior: Behavior normal.        Thought Content: Thought content normal.        Judgment: Judgment normal.   Using a walker Neck is stiff  Lab Results  Component Value Date   WBC 7.5 10/05/2023   HGB 12.8 10/05/2023   HCT 39.6 10/05/2023   PLT 267.0 10/05/2023   GLUCOSE 89 12/13/2023   CHOL 228 (H) 12/13/2023   TRIG 159.0 (H) 12/13/2023   HDL 51.80 12/13/2023   LDLDIRECT 98.0 08/13/2021   LDLCALC 144 (H) 12/13/2023   ALT 17 12/13/2023   AST 17 12/13/2023   NA 134 (L) 12/13/2023   K 3.7 12/13/2023   CL 98 12/13/2023   CREATININE 0.76 12/13/2023   BUN 14 12/13/2023   CO2 26 12/13/2023   TSH 1.70 09/12/2023   HGBA1C 5.5 06/13/2020    DG Lumbar Spine 2-3  Views Result Date: 11/25/2023 CLINICAL DATA:  Chronic bilateral low back pain. No history of trauma reported EXAM: LUMBAR SPINE - 3 VIEW COMPARISON:  02/01/2023 x-ray. FINDINGS: Five lumbar-type vertebral bodies. Preserved vertebral body heights. Global osteopenia. Scattered mild-to-moderate endplate osteophytes identified diffusely. Multilevel severe disc height loss greatest at L5-S1, T12-L1. Grade 1 anterolisthesis of L4 on L5 and trace L5 on S1. There is also some retrolisthesis at L3-4, L2-3 and L1-2. Multilevel severe facet degenerative changes. Slight levoconvex curvature of the mid lumbar spine on the frontal view. Degenerative changes of the sacroiliac joints. Presumed vascular calcifications. IMPRESSION: Severe degenerative changes with curvature and multilevel listhesis. Electronically Signed   By: Karen Kays M.D.   On: 11/25/2023 16:03    Assessment & Plan:   Problem List Items Addressed This Visit     Rash   Triamc cream prn D/c voltaren gel      Neck pain - Primary   Neck and R shoulder pain  Tramadol prn (OK w/pt and family to use)  Potential benefits of a long term opioids use as well as potential risks (i.e. addiction risk, apnea etc) and complications (i.e. Somnolence, constipation and others) were explained to the patient and were aknowledged. Voltaren gel - d/c Use Trolamine X ray C spine      Cervical spine instability   C spine CT: Fracture of the anterior arch of C1 with 12 mm separation. Nondisplaced fracture posterior arch of C1. There is lateral subluxation of the lateral mass of C1 on C2 bilaterally. No other cervical spine fracture  S/p titanium rods/screws fusion. Should be ok for an MRI      Gait disorder  Using a walker Balance class      Mild cognitive impairment with memory loss   Use electric toothbrush         Meds ordered this encounter  Medications   triamcinolone ointment (KENALOG) 0.1 %    Sig: Apply 1 Application topically 3  (three) times daily. As needed for itching    Dispense:  80 g    Refill:  2   DISCONTD: traMADol (ULTRAM) 50 MG tablet    Sig: Take 1 tablet (50 mg total) by mouth 2 (two) times daily.    Dispense:  60 tablet    Refill:  2      Follow-up: Return in about 2 months (around 02/18/2024) for a follow-up visit.  Sonda Primes, MD

## 2023-12-21 NOTE — Assessment & Plan Note (Signed)
Use electric toothbrush

## 2023-12-21 NOTE — Patient Instructions (Signed)
Use electric toothbrush

## 2023-12-21 NOTE — Assessment & Plan Note (Signed)
Using a walker Balance class

## 2023-12-21 NOTE — Assessment & Plan Note (Signed)
C spine CT: Fracture of the anterior arch of C1 with 12 mm separation. Nondisplaced fracture posterior arch of C1. There is lateral subluxation of the lateral mass of C1 on C2 bilaterally. No other cervical spine fracture  S/p titanium rods/screws fusion. Should be ok for an MRI

## 2023-12-21 NOTE — Assessment & Plan Note (Signed)
Triamc cream prn D/c voltaren gel

## 2023-12-28 DIAGNOSIS — L905 Scar conditions and fibrosis of skin: Secondary | ICD-10-CM | POA: Diagnosis not present

## 2023-12-28 DIAGNOSIS — L821 Other seborrheic keratosis: Secondary | ICD-10-CM | POA: Diagnosis not present

## 2023-12-28 DIAGNOSIS — D485 Neoplasm of uncertain behavior of skin: Secondary | ICD-10-CM | POA: Diagnosis not present

## 2023-12-28 DIAGNOSIS — L820 Inflamed seborrheic keratosis: Secondary | ICD-10-CM | POA: Diagnosis not present

## 2023-12-30 DIAGNOSIS — H53143 Visual discomfort, bilateral: Secondary | ICD-10-CM | POA: Diagnosis not present

## 2023-12-30 DIAGNOSIS — Z961 Presence of intraocular lens: Secondary | ICD-10-CM | POA: Diagnosis not present

## 2023-12-30 DIAGNOSIS — Z9849 Cataract extraction status, unspecified eye: Secondary | ICD-10-CM | POA: Diagnosis not present

## 2024-01-02 ENCOUNTER — Ambulatory Visit (HOSPITAL_BASED_OUTPATIENT_CLINIC_OR_DEPARTMENT_OTHER)
Admission: RE | Admit: 2024-01-02 | Discharge: 2024-01-02 | Disposition: A | Discharge status: Home / Self Care | Payer: Medicare Other | Source: Ambulatory Visit | Attending: Internal Medicine | Admitting: Internal Medicine

## 2024-01-02 ENCOUNTER — Encounter (HOSPITAL_COMMUNITY): Payer: Self-pay | Admitting: Internal Medicine

## 2024-01-02 ENCOUNTER — Ambulatory Visit (HOSPITAL_COMMUNITY)
Admission: RE | Admit: 2024-01-02 | Discharge: 2024-01-02 | Disposition: A | Discharge status: Home / Self Care | Payer: Medicare Other | Source: Ambulatory Visit | Attending: Internal Medicine | Admitting: Internal Medicine

## 2024-01-02 VITALS — BP 140/70 | HR 80 | Wt 115.2 lb

## 2024-01-02 DIAGNOSIS — I341 Nonrheumatic mitral (valve) prolapse: Secondary | ICD-10-CM | POA: Diagnosis not present

## 2024-01-02 DIAGNOSIS — I10 Essential (primary) hypertension: Secondary | ICD-10-CM | POA: Diagnosis not present

## 2024-01-02 DIAGNOSIS — E785 Hyperlipidemia, unspecified: Secondary | ICD-10-CM | POA: Diagnosis not present

## 2024-01-02 DIAGNOSIS — I083 Combined rheumatic disorders of mitral, aortic and tricuspid valves: Secondary | ICD-10-CM | POA: Insufficient documentation

## 2024-01-02 DIAGNOSIS — I509 Heart failure, unspecified: Secondary | ICD-10-CM | POA: Diagnosis not present

## 2024-01-02 DIAGNOSIS — I351 Nonrheumatic aortic (valve) insufficiency: Secondary | ICD-10-CM | POA: Diagnosis not present

## 2024-01-02 DIAGNOSIS — I110 Hypertensive heart disease with heart failure: Secondary | ICD-10-CM | POA: Insufficient documentation

## 2024-01-02 DIAGNOSIS — I350 Nonrheumatic aortic (valve) stenosis: Secondary | ICD-10-CM | POA: Diagnosis not present

## 2024-01-02 LAB — ECHOCARDIOGRAM COMPLETE
AR max vel: 1.83 cm2
AV Area VTI: 2.06 cm2
AV Area mean vel: 1.81 cm2
AV Mean grad: 6 mmHg
AV Peak grad: 11.4 mmHg
Ao pk vel: 1.69 m/s
Area-P 1/2: 3.7 cm2
Calc EF: 70.5 %
MV VTI: 3.32 cm2
P 1/2 time: 357 ms
S' Lateral: 2.8 cm
Single Plane A2C EF: 71.5 %
Single Plane A4C EF: 70.6 %

## 2024-01-02 NOTE — Patient Instructions (Signed)
Follow-Up in: 1 year with Echo PLEASE CALL OUR OFFICE AROUND DECEMBER 2025 TO GET SCHEDULED FOR YOUR APPOINTMENT. PHONE NUMBER IS [phone] OPTION 2   At the [facility], you and your health needs are our priority. We have a designated team specialized in the treatment of Heart Failure. This Care Team includes your primary Heart Failure Specialized Cardiologist (physician), Advanced Practice Providers (APPs- Physician Assistants and Nurse Practitioners), and Pharmacist who all work together to provide you with the care you need, when you need it.   You may see any of the following providers on your designated Care Team at your next follow up:  Dr. Arvilla Meres Dr. Marca Ancona Dr. Dorthula Nettles Dr. Theresia Bough Tonye Becket, NP Robbie Lis, [place] [facility] [place], [place] Brynda Peon, NP [place] Lee, NP Karle Plumber, PharmD   Please be sure to bring in all your medications bottles to every appointment.   Need to Contact us:  If you have any questions or concerns before your next appointment please send [place] a message through [place] or call our office at [phone].    TO LEAVE A MESSAGE FOR THE NURSE SELECT OPTION 2, PLEASE LEAVE A MESSAGE INCLUDING: YOUR NAME DATE OF BIRTH CALL BACK NUMBER REASON FOR CALL**this is important as we prioritize the call backs  YOU WILL RECEIVE A CALL BACK THE SAME DAY AS LONG AS YOU CALL BEFORE 4:00 PM

## 2024-01-02 NOTE — Progress Notes (Signed)
CARDIOLOGY CLINIC NOTE  Patient ID: MICHEALE [name], female   DOB: 1935-12-26, 88 y.o.   MRN: [id number]  HPI:  [name] is a 88 year old woman with a history of hyperlipidemia, mild mitral valve prolapse with mild mitral regurgitation (echo 12/11).  She has undergone CT angiogram at the [facility] about 10 years ago which was totally normal.   Here for f/u. Usually very active with her trainer. But recently less active with shoulder pain. No CP or SOB. No edema. SBP runs ~140.   Echo today 01/01/23 EF 60-65 Mild AS Mild to mod AI  Echo 02/11/23 EF 60-65%  G1DD. Mild AI Mild MR  Echo 02/02/21: EF 60-65% G 1 DD AoV moderately calcified. Mild AS Mean gradient 7mm HG Mild AI.  Echo 8/20 EF 60-65 grade I DD Mild AI. No significant AS  Echo 02/2017: EF 55%Trivial MVP Mild MR Calcified Aov Moves well. Mild AI. No AS Echo 02/2018: EF 60% Mild MR Calcified AoV Very mild AS mean gradient 4 Mild AI Grade I DD Personally reviewed   Lab Results  Component Value Date   CHOL 228 (H) 12/13/2023   HDL 51.80 12/13/2023   LDLCALC 144 (H) 12/13/2023   LDLDIRECT 98.0 08/13/2021   TRIG 159.0 (H) 12/13/2023   CHOLHDL 4 12/13/2023    ROS: All systems negative except as listed in HPI, PMH and Problem List.  Past Medical History:  Diagnosis Date   Allergic rhinitis due to animal (cat) (dog) hair and dander 08/07/2021   Allergic rhinitis due to pollen 08/07/2021   Aortic valve sclerosis 03/12/2015   Atrophic gastritis 09/03/2008   Atrophic vaginitis    Baker's cyst    Left-Dr. Aluisio   Bladder pain 08/19/2014   Chronic  Take Vesicare 10 mg/d  UA was ok  On Premarin cream PV per GYN  On Vesicare  F/u w/Dr Monico Blitz     Cataract    Dr. Dione Booze   Cervical spinal cord compression 05/04/2021   Cervical spine instability 05/15/2021   Dr. Venetia Maxon: plan -  posterior C1-2 decompression and instrumented fusion.  MRI: Progressive soft tissue pannus at C1-2 is now creating mass  effect on the craniocervical junction with  distortion of the upper  spinal cord. This is likely related rheumatoid arthritis  C spine CT: Fracture of the anterior arch of C1 with 12 mm separation. Nondisplaced fracture posterior arch of C1. There is later   Cervical spondylosis 01/05/2019   Chalazion left upper eyelid 04/18/2023   X2  Keflex po - too big... Will use Ceftin  F/u w/Ophthalmology   CHF (congestive heart failure)    Cholelithiasis 09/09/2019   Per Dr Marina Goodell: "At this point we mutually decided to watch her abdominal complaints.  If symptoms accelerate or become more classic for symptomatic cholelithiasis, then she is agreeable to surgical referral.  She will keep me posted"   Chronic left SI joint pain 11/30/2022   Closed fracture of first cervical vertebra 08/07/2021   Closed fracture of second cervical vertebra 08/07/2021   Concussion 10/29/2021   s/p a hard fall after she rushed and slipped on the wet marble floor while visiting family in [place] a couple week ago.  She hit the side of her skull, had a skin laceration bled quite a bit.  She was dazed and according to her son Lorin Picket had a loss of consciousness of under a minute duration.  She felt dazed.  She was taken to ER.  2 staples were  applied to her skin laceration on the left o   Constipation 11/11/2016   Chronic, multifactorial  Miralax prn  7/22 Postop constipation related to oxycodone and Robaxin.  We can discontinue oxycodone Robaxin.  The patient will use tramadol and Tylenol instead.  Use MiraLAX or Senokot as to produce soft regular stools.  Discontinue Pepto-Bismol.  I think this should help with abdominal bloating complaint.      Take Metamucil daily  Senokot as to produce soft regular sto   Contact dermatitis and eczema 06/14/2018   Eczema vs other - 7/19  Cortaid prn  Depo-medrol IM 80 mg  Remove nail polish     Contusion of left chest wall 01/09/2015   Rebel fell and broke a rib #8 on the L last week, she had a rib X ray and a CT (Dr Thurston Hole).   Contusion of  right knee 08/12/2016   R knee   Degenerative disc disease, cervical 03/11/2021   Duodenitis 09/03/2008   Essential hypertension, benign 07/27/2011   Chronic, mild  NAS diet  Cardiac CT scan for calcium scoring offered 1/20  1/24 HTN and s/p ER visit on 12/08/22 for SBP 180. Per Dr Lynelle Doctor: " Discussed with the patient that her restarting donepezil could have impacted her symptoms today.  However, her blood pressure continues to be mildly elevated.  Will place her on 2.5 mg amlodipine to take if her blood pressure is over 170/100.  Nursing aide at   Fatigue 09/08/2014   10/15, 2/16, 9/17, 2/18  No other sx  Bellamie is managing it with short rest periods 1-2/d     Fibroid    Gait disorder 02/28/2022   Worse  Multifactorial  Cont w/PT  In the balance class  Balance class q 1 week     [name] resolved after doing exercises by Dr Darrick Penna 11/2021     Generalized anxiety disorder 08/22/2018   Chronic   Lexapro - not taking  Weighted blanket  Lorazepam prn, - tolerance has developed   Potential benefits of a long term benzodiazepines  use as well as potential risks  and complications were explained to the patient and were aknowledged.     GERD (gastroesophageal reflux disease) 11/20/2007   Chronic. Better on Gluten free diet  Dexilant   Potential benefits of a long term PPI  use as well as potential risks  and complications were explained to the patient and were aknowledged.  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     History of basal cell carcinoma 09/29/2022   History of colonic polyps 09/04/2008   Hyperlipemia, mixed 03/15/2011   Chronic  On Simvastatin     Hypotension 07/13/2023   Stop Amlodipine     Hypothyroidism    Impacted cerumen of right ear 07/16/2013   Increased frequency of urination 08/19/2014   Inflammation  of sacroiliac joint 11/16/2022   Insomnia 01/24/2013   Lorazepam prn   Potential benefits of a long term benzodiazepines  use as well as potential risks  and complications were explained to the patient and were aknowledged.        Irritable bowel syndrome 09/04/2008   Chronic   Marina Goodell MD, Wilhemina Bonito  2013 -  much better on gluten free diet              Lactose intolerance 11/20/2007   Lactaid        Lumbar degenerative disc disease 12/21/2022   Based on MRI and her exam there is significant DDD in lumbar spine     Lumbar spondylosis    Melanocytic nevi of trunk 02/22/2022   Meningioma 03/25/2020   Dr Venetia Maxon     Mild cognitive impairment with memory loss 09/15/2023   Mild intermittent asthma 08/07/2021   Multiple thyroid nodules 10/04/2011   2013 she was started on Synthroid - tol well  2017, 2022 [place]     MVP (mitral valve prolapse)    Antibiotics required for dental procedures   Neck pain 12/26/2018   Chronic  PT, traction, TENs unit was offered  Tramadol prn   Potential benefits of a long term opioids use as well as potential risks (i.e. addiction risk, apnea etc) and complications (i.e. Somnolence, constipation and others) were explained to the patient and were aknowledged.        OAB (overactive bladder) 09/02/2010   Dr Earlene Plater  Use Solifenacin prn  Worse - ?multifactorial  UA was normal  [place] Dec increased Memantine to 10 mg bid a month ago     Osteoarthritis 02/25/2008   Dr Ethelene Hal  Blue-Emu cream was recommended to use 2-3 times a day        Osteoporosis 11/20/2007   Chronic  Per GYN        Overactive bladder    Pain of right sacroiliac joint 01/14/2023   Postherpetic neuralgia 10/02/2013   Pruritus of skin 03/11/2012   S/p H zoster vaccination  Relapsing  8/12 - on face  8/14 - R ear/face  10/14 ?     Sacroiliitis    Seborrheic dermatitis 02/22/2022   Sinusitis, chronic 08/06/2016   Worse  Flonase, Atrovent nasal, Singulair, Claritin  ENT ref Dr Dorma Russell  CT sinuses     Skin ulcer of nose  07/13/2023   Soft pads on the nose  Derm appt     Spondylolisthesis, cervical region 03/11/2021   Stool incontinence 11/16/2022   12/23  Worse.  Stool leakage - new  D/c Aricept  Abd X ray to rule out constipation     Tibialis tendinitis 03/20/2008   Torn meniscus    bilateral   Trochanteric bursitis of right hip 09/22/2020   Upper respiratory infection 08/03/2012   9/13 - 3 wks  2/17  11/18 - asthmatic bronchitis     Urinary urgency 08/19/2014   2023 worse.  Obtain urinalysis.  D/c Aricept  UA was ok  On Premarin cream PV per GYN  On Vesicare  F/u w/Dr Monico Blitz     Urticaria 02/22/2022    Current Outpatient Medications  Medication Sig Dispense Refill   acetaminophen (TYLENOL) 500 MG tablet Take 1,000 mg by mouth every 6 (six) hours as needed for mild pain.     albuterol (VENTOLIN HFA) 108 (90 Base) MCG/ACT inhaler Take 2 puffs every 4-6 hours as needed     amLODipine (NORVASC) 2.5 MG tablet Take 2.5 mg by mouth as needed.     Azelastine HCl 0.15 % SOLN Place into both nostrils. Place into both nostrils.     Calcium Carbonate-Vitamin D (CALCIUM CARBONATE W/VITAMIN D PO) Take 1 tablet by mouth daily.     cyanocobalamin (VITAMIN B12) 1000 MCG tablet Take 1 tablet (1,000 mcg total) by mouth daily. 100  tablet 3   desonide (DESOWEN) 0.05 % cream Apply 1 application topically as needed.     EPINEPHrine (EPIPEN 2-PAK) 0.3 mg/0.3 mL IJ SOAJ injection Inject 0.3 mLs (0.3 mg total) into the muscle as needed. 1 Device 1   erythromycin ophthalmic ointment Place 1 Application into both eyes as needed.     famotidine (PEPCID) 40 MG tablet Take 40 mg by mouth as needed for heartburn or indigestion.     hydrocortisone (ANUSOL-HC) 2.5 % rectal cream Place 1 Application rectally as needed for hemorrhoids or anal itching.     ipratropium (ATROVENT) 0.03 % nasal spray Place 1 spray into both nostrils at bedtime.     levocetirizine (XYZAL) 5 MG tablet Take 5 mg by mouth every evening.     levothyroxine  (SYNTHROID) 50 MCG tablet TAKE ONE TABLET BY MOUTH DAILY BEFORE BREAKFAST 30 tablet 5   loratadine (CLARITIN) 10 MG tablet Take 1 tablet (10 mg total) by mouth daily. 100 tablet 3   LORazepam (ATIVAN) 1 MG tablet Take 2-3 tablets at bedtime and 1-2 tablets in the daytime as needed. 150 tablet 2   memantine (NAMENDA XR) 14 MG CP24 24 hr capsule Take 1 capsule (14 mg total) by mouth at bedtime. 90 capsule 11   montelukast (SINGULAIR) 10 MG tablet Take 1 tablet (10 mg total) by mouth at bedtime. 90 tablet 1   oxymetazoline (AFRIN 12 HOUR) 0.05 % nasal spray Place 1 spray into both nostrils as needed.     pantoprazole (PROTONIX) 40 MG tablet Take 40 mg by mouth as needed.     polyethylene glycol powder (GLYCOLAX/MIRALAX) 17 GM/SCOOP powder Take 17 g by mouth 2 (two) times daily as needed for moderate constipation. 500 g 3   psyllium (METAMUCIL) 58.6 % packet Take 1 packet by mouth as needed.     solifenacin (VESICARE) 10 MG tablet Take 1 tablet (10 mg total) by mouth daily as needed (bladder). 90 tablet 3   triamcinolone (NASACORT) 55 MCG/ACT AERO nasal inhaler Place 2 sprays into the nose as needed.     triamcinolone ointment (KENALOG) 0.1 % Apply 1 Application topically 3 (three) times daily. As needed for itching 80 g 2   No current facility-administered medications for this encounter.    Filed Weights   01/02/24 1516  Weight: 52.3 kg (115 lb 3.2 oz)     PHYSICAL EXAM: Vitals:   01/02/24 1516  BP: (!) 140/70  Pulse: 80  SpO2: 97%    General:  Eldelry. No resp difficulty HEENT: normal Neck: supple. no JVD. Carotids 2+ bilat; no bruits. No lymphadenopathy or thryomegaly appreciated. Cor: PMI nondisplaced. Regular rate & rhythm. Very soft AS murmur Lungs: clear Abdomen: soft, nontender, nondistended. No hepatosplenomegaly. No bruits or masses. Good bowel sounds. Extremities: no cyanosis, clubbing, rash, edema Neuro: alert & orientedx3, cranial nerves grossly intact. moves all 4  extremities w/o difficulty. Affect pleasant  NSR 82 + LVH Personally reviewed   ASSESSMENT & PLAN:  1. Aortic valve thickening with mild AS/mild AI - Echo  02/02/21 EF 60-65 grade I DD Mild AI. No significant AS  - Echo 02/11/23 EF 60-65%  G1DD. Mild AI Mild MR  - Echo today 01/01/23 EF 60-65 Mild AS Mild to mod AI Personally reviewed - Sable follow with yearly echos  2. MVP, mild -  stable mild MR on echo today  3. Hyperlipidemia - Followed by PCP - On simva 40   4. HTN - Blood pressure well controlled. Continue  current regimen.   Arvilla Meres, MD 3:47 PM

## 2024-01-02 NOTE — Progress Notes (Signed)
  Echocardiogram 2D Echocardiogram has been performed.  Ocie Doyne RDCS 01/02/2024, 2:40 PM

## 2024-01-02 NOTE — Addendum Note (Signed)
Encounter addended by: Baird Cancer, RN on: 01/02/2024 3:55 PM  Actions taken: Clinical Note Signed, Order list changed, Diagnosis association updated

## 2024-01-03 ENCOUNTER — Encounter: Payer: Self-pay | Admitting: Sports Medicine

## 2024-01-03 ENCOUNTER — Ambulatory Visit (INDEPENDENT_AMBULATORY_CARE_PROVIDER_SITE_OTHER): Payer: Medicare Other | Admitting: Sports Medicine

## 2024-01-03 VITALS — BP 132/60 | Ht 60.0 in | Wt 113.0 lb

## 2024-01-03 DIAGNOSIS — M5030 Other cervical disc degeneration, unspecified cervical region: Secondary | ICD-10-CM

## 2024-01-03 NOTE — Progress Notes (Signed)
 PCP: Plotnikov, Karlynn GAILS, MD  Chief Complaint: Right shoulder pain Subjective:   HPI: Patient is a 88 y.o. female here for follow-up of her right shoulder pain and cervical radiculopathy.  Patient states that she has not been taking her tramadol  after previous appointment because her sons do not want her to because they feared for any side effects that may cause her to fall or have worsening dementia.  Patient notes that her pain has improved slightly but she still having significant pain starting in the neck going down to the trapezius and sometimes into the left humeral head/shoulder area.  Patient has no pain or numbness tingling down her arm anymore.  Patient states that that part has improved significantly..   Past Medical History:  Diagnosis Date   Allergic rhinitis due to animal (cat) (dog) hair and dander 08/07/2021   Allergic rhinitis due to pollen 08/07/2021   Aortic valve sclerosis 03/12/2015   Atrophic gastritis 09/03/2008   Atrophic vaginitis    Baker's cyst    Left-Dr. Aluisio   Bladder pain 08/19/2014   Chronic  Take Vesicare  10 mg/d  UA was ok  On Premarin cream PV per GYN  On Vesicare   F/u w/Dr JONELLE Moats     Cataract    Dr. Octavia   Cervical spinal cord compression 05/04/2021   Cervical spine instability 05/15/2021   Dr. Unice: plan -  posterior C1-2 decompression and instrumented fusion.  MRI: Progressive soft tissue pannus at C1-2 is now creating mass  effect on the craniocervical junction with distortion of the upper  spinal cord. This is likely related rheumatoid arthritis  C spine CT: Fracture of the anterior arch of C1 with 12 mm separation. Nondisplaced fracture posterior arch of C1. There is later   Cervical spondylosis 01/05/2019   Chalazion left upper eyelid 04/18/2023   X2  Keflex  po - too big... Will use Ceftin   F/u w/Ophthalmology   CHF (congestive heart failure)    Cholelithiasis 09/09/2019   Per Dr Abran: At this point we mutually decided to watch her  abdominal complaints.  If symptoms accelerate or become more classic for symptomatic cholelithiasis, then she is agreeable to surgical referral.  She will keep me posted   Chronic left SI joint pain 11/30/2022   Closed fracture of first cervical vertebra 08/07/2021   Closed fracture of second cervical vertebra 08/07/2021   Concussion 10/29/2021   s/p a hard fall after she rushed and slipped on the wet marble floor while visiting family in [place]  a couple week ago.  She hit the side of her skull, had a skin laceration bled quite a bit.  She was dazed and according to her son [name] had a loss of consciousness of under a minute duration.  She felt dazed.  She was taken to ER.  2 staples were applied to her skin laceration on the left o   Constipation 11/11/2016   Chronic, multifactorial  Miralax  prn  7/22 Postop constipation related to oxycodone  and Robaxin .  We can discontinue oxycodone  Robaxin .  The patient will use tramadol  and Tylenol  instead.  Use MiraLAX  or Senokot as to produce soft regular stools.  Discontinue Pepto-Bismol.  I think this should help with abdominal bloating complaint.      Take Metamucil daily  Senokot as to produce soft regular sto   Contact dermatitis and eczema 06/14/2018   Eczema vs other - 7/19  Cortaid prn  Depo-medrol  IM 80 mg  Remove nail polish  Contusion of left chest wall 01/09/2015   Penni fell and broke a rib #8 on the L last week, she had a rib X ray and a CT (Dr Jane).   Contusion of right knee 08/12/2016   R knee   Degenerative disc disease, cervical 03/11/2021   Duodenitis 09/03/2008   Essential hypertension, benign 07/27/2011   Chronic, mild  NAS diet  Cardiac CT scan for calcium  scoring offered 1/20  1/24 HTN and s/p ER visit on 12/08/22 for SBP 180. Per Dr Randol:  Discussed with the patient that her restarting donepezil  could have impacted her symptoms today.  However, her blood pressure continues to be mildly elevated.  Will place her on 2.5 mg  amlodipine  to take if her blood pressure is over 170/100.  Nursing aide at   Fatigue 09/08/2014   10/15, 2/16, 9/17, 2/18  No other sx  Sherrita is managing it with short rest periods 1-2/d     Fibroid    Gait disorder 02/28/2022   Worse  Multifactorial  Cont w/PT  In the balance class  Balance class q 1 week     [name] resolved after doing exercises by Dr Harvey 11/2021     Generalized anxiety disorder 08/22/2018   Chronic   Lexapro  - not taking  Weighted blanket  Lorazepam  prn, - tolerance has developed   Potential benefits of a long term benzodiazepines  use as well as potential risks  and complications were explained to the patient and were aknowledged.     GERD (gastroesophageal reflux disease) 11/20/2007   Chronic. Better on Gluten free diet  Dexilant    Potential benefits of a long term PPI  use as well as potential risks  and complications were explained to the patient and were aknowledged.  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     History of basal cell carcinoma 09/29/2022   History of colonic polyps 09/04/2008   Hyperlipemia, mixed 03/15/2011   Chronic  On Simvastatin      Hypotension 07/13/2023   Stop Amlodipine      Hypothyroidism    Impacted cerumen of right ear 07/16/2013   Increased frequency of urination 08/19/2014   Inflammation of sacroiliac joint 11/16/2022   Insomnia 01/24/2013   Lorazepam  prn   Potential benefits of a long term benzodiazepines  use as well as potential risks  and complications were explained to the patient and were aknowledged.        Irritable bowel syndrome 09/04/2008   Chronic   Abran MD, Norleen SAILOR  2013 - much better on gluten free diet              Lactose intolerance 11/20/2007   Lactaid        Lumbar degenerative disc disease 12/21/2022   Based on MRI and her exam  there is significant DDD in lumbar spine     Lumbar spondylosis    Melanocytic nevi of trunk 02/22/2022   Meningioma 03/25/2020   Dr Unice     Mild cognitive impairment with memory loss 09/15/2023   Mild intermittent asthma 08/07/2021   Multiple thyroid  nodules 10/04/2011   2013 she was started on Synthroid  - tol well  2017, 2022 US  OK  MVP (mitral valve prolapse)    Antibiotics required for dental procedures   Neck pain 12/26/2018   Chronic  PT, traction, TENs unit was offered  Tramadol  prn   Potential benefits of a long term opioids use as well as potential risks (i.e. addiction risk, apnea etc) and complications (i.e. Somnolence, constipation and others) were explained to the patient and were aknowledged.        OAB (overactive bladder) 09/02/2010   Dr Nicholaus  Use Solifenacin  prn  Worse - ?multifactorial  UA was normal  Camie increased Memantine  to 10 mg bid a month ago     Osteoarthritis 02/25/2008   Dr Bonner  Blue-Emu cream was recommended to use 2-3 times a day        Osteoporosis 11/20/2007   Chronic  Per GYN        Overactive bladder    Pain of right sacroiliac joint 01/14/2023   Postherpetic neuralgia 10/02/2013   Pruritus of skin 03/11/2012   S/p H zoster vaccination  Relapsing  8/12 - on face  8/14 - R ear/face  10/14 ?     Sacroiliitis    Seborrheic dermatitis 02/22/2022   Sinusitis, chronic 08/06/2016   Worse  Flonase , Atrovent  nasal, Singulair , Claritin   ENT ref Dr Thaddeus  CT sinuses     Skin ulcer of nose 07/13/2023   Soft pads on the nose  Derm appt     Spondylolisthesis, cervical region 03/11/2021   Stool incontinence 11/16/2022   12/23  Worse.  Stool leakage - new  D/c Aricept   Abd X ray to rule out constipation     Tibialis tendinitis 03/20/2008   Torn meniscus    bilateral   Trochanteric bursitis of right hip 09/22/2020   Upper respiratory infection 08/03/2012   9/13 - 3 wks  2/17  11/18 - asthmatic bronchitis     Urinary urgency 08/19/2014   2023 worse.   Obtain urinalysis.  D/c Aricept   UA was ok  On Premarin cream PV per GYN  On Vesicare   F/u w/Dr JONELLE Nicholaus     Urticaria 02/22/2022    Current Outpatient Medications on File Prior to Visit  Medication Sig Dispense Refill   acetaminophen  (TYLENOL ) 500 MG tablet Take 1,000 mg by mouth every 6 (six) hours as needed for mild pain.     albuterol  (VENTOLIN  HFA) 108 (90 Base) MCG/ACT inhaler Take 2 puffs every 4-6 hours as needed     amLODipine  (NORVASC ) 2.5 MG tablet Take 2.5 mg by mouth as needed.     Azelastine  HCl 0.15 % SOLN Place into both nostrils. Place into both nostrils.     Calcium  Carbonate-Vitamin D  (CALCIUM  CARBONATE W/VITAMIN D  PO) Take 1 tablet by mouth daily.     cyanocobalamin  (VITAMIN B12) 1000 MCG tablet Take 1 tablet (1,000 mcg total) by mouth daily. 100 tablet 3   desonide  (DESOWEN ) 0.05 % cream Apply 1 application topically as needed.     EPINEPHrine  (EPIPEN  2-PAK) 0.3 mg/0.3 mL IJ SOAJ injection Inject 0.3 mLs (0.3 mg total) into the muscle as needed. 1 Device 1   erythromycin  ophthalmic ointment Place 1 Application into both eyes as needed.     famotidine  (PEPCID ) 40 MG tablet Take 40 mg by mouth as needed for heartburn or indigestion.     hydrocortisone  (ANUSOL -HC) 2.5 % rectal cream Place 1 Application rectally as needed for hemorrhoids or anal itching.     ipratropium (ATROVENT ) 0.03 % nasal spray Place 1 spray into both nostrils  at bedtime.     levocetirizine (XYZAL ) 5 MG tablet Take 5 mg by mouth every evening.     levothyroxine  (SYNTHROID ) 50 MCG tablet TAKE ONE TABLET BY MOUTH DAILY BEFORE BREAKFAST 30 tablet 5   loratadine  (CLARITIN ) 10 MG tablet Take 1 tablet (10 mg total) by mouth daily. 100 tablet 3   LORazepam  (ATIVAN ) 1 MG tablet Take 2-3 tablets at bedtime and 1-2 tablets in the daytime as needed. 150 tablet 2   memantine  (NAMENDA  XR) 14 MG CP24 24 hr capsule Take 1 capsule (14 mg total) by mouth at bedtime. 90 capsule 11   montelukast  (SINGULAIR ) 10 MG tablet Take  1 tablet (10 mg total) by mouth at bedtime. 90 tablet 1   oxymetazoline (AFRIN 12 HOUR) 0.05 % nasal spray Place 1 spray into both nostrils as needed.     pantoprazole  (PROTONIX ) 40 MG tablet Take 40 mg by mouth as needed.     polyethylene glycol powder (GLYCOLAX /MIRALAX ) 17 GM/SCOOP powder Take 17 g by mouth 2 (two) times daily as needed for moderate constipation. 500 g 3   psyllium (METAMUCIL) 58.6 % packet Take 1 packet by mouth as needed.     solifenacin  (VESICARE ) 10 MG tablet Take 1 tablet (10 mg total) by mouth daily as needed (bladder). 90 tablet 3   triamcinolone  (NASACORT ) 55 MCG/ACT AERO nasal inhaler Place 2 sprays into the nose as needed.     triamcinolone  ointment (KENALOG ) 0.1 % Apply 1 Application topically 3 (three) times daily. As needed for itching 80 g 2   No current facility-administered medications on file prior to visit.    Past Surgical History:  Procedure Laterality Date   CATARACT EXTRACTION, BILATERAL     EYE SURGERY     POSTERIOR CERVICAL FUSION/FORAMINOTOMY N/A 05/15/2021   Procedure: Cervical One Laminectomy with resection of cyst, Fixation from Occiput to Cervical Four;  Surgeon: Unice Pac, MD;  Location: [facility] OR;  Service: Neurosurgery;  Laterality: N/A;   TONSILLECTOMY      Allergies  Allergen Reactions   Bee Venom Itching, Swelling and Rash    Itching, swelling and rash with bee stings, patient has epi pen   Bacitracin -Polymyxin B Other (See Comments)   Clarithromycin Other (See Comments)   Diclofenac      ??rash from Voltaren  gel   Doxycycline      ??   Neosporin [Neomycin -Bacitracin  Zn-Polymyx]    Hydrocortisone  Rash    BP 132/60   Ht 5' (1.524 m)   Wt 113 lb (51.3 kg)   BMI 22.07 kg/m       No data to display              No data to display              Objective:  Physical Exam:  Gen: NAD, comfortable in exam room  Inspection no gross deformity or asymmetry. Palpation there is some tenderness over the trapezius as well  as over the [facility] joint and humeral head.  Range of motion has increased compared to previously, patient has increased range of motion with flexion and abduction.  Strength is decreased due to pain.   Assessment & Plan:  1. 1. Degenerative disc disease, cervical (Primary) -Discussed with patient that at this time options would be to continue to as is without any medication, or to try tramadol  and if no improvement with that can go to gabapentin  though tramadol  would be a shorter duration of medication versus the gabapentin .   Another  option could benefit patient that is shockwave therapy, we can trial a few episodes of shockwave therapy over the trapezius area to see if that would help with the spasming. -Patient would like to trial the shockwave therapy and if there is no improvement she would like to start the tramadol . -Patient noted good relief from shockwave therapy right after, will see how patient does over the next few days, patient made appointment for shockwave therapy for 1 week from now.  Procedure: ECSWT Indications:  Trapezius spasm   Procedure Details Consent: Risks of procedure as well as the alternatives and risks of each were explained to the patient.  Verbal consent for procedure obtained. Time Out: Verified patient identification, verified procedure, site was marked, verified correct patient position. The area was cleaned with alcohol swab.     The right trapezius was targeted for Extracorporeal shockwave therapy.    Preset: None Power Level: 60 Frequency: 9 hz Impulse/cycles: 1000 Head size: Regular   Patient tolerated procedure well without immediate complications. Can increase level at next appointment. Will continue to be aware to avoid the bony prominences near the trapezius such as the [facility] joint and the humeral head    Reyne Bustle MD, PGY-4  Sports Medicine Fellow [facility]  I observed and examined the patient with the [facility] resident and agree  with assessment and plan.  Note reviewed and modified by me. Patient had enough immediate relief that she would like to try ESWT.  This is preferable to adding tramadol  or gabapentin  if we can get her enough pain relief. PATRICE Haddock, MD

## 2024-01-06 ENCOUNTER — Ambulatory Visit (INDEPENDENT_AMBULATORY_CARE_PROVIDER_SITE_OTHER): Payer: Self-pay | Admitting: Family Medicine

## 2024-01-06 ENCOUNTER — Ambulatory Visit: Payer: Self-pay | Admitting: Family Medicine

## 2024-01-06 ENCOUNTER — Encounter: Payer: Self-pay | Admitting: Family Medicine

## 2024-01-06 DIAGNOSIS — M5030 Other cervical disc degeneration, unspecified cervical region: Secondary | ICD-10-CM

## 2024-01-06 NOTE — Progress Notes (Addendum)
 This is patients second session, noted improvement from previous session and has had slight increased ROM.  Procedure: ECSWT Indications:  Trapezius spasm    Procedure Details Consent: Risks of procedure as well as the alternatives and risks of each were explained to the patient.  Verbal consent for procedure obtained. Time Out: Verified patient identification, verified procedure, site was marked, verified correct patient position. The area was cleaned with alcohol swab.     The right trapezius was targeted for Extracorporeal shockwave therapy.    Preset: None Power Level: 80  Frequency: 10 hz Impulse/cycles: 1500 Head size: Regular   Patient tolerated procedure well without immediate complications. Can increase level at next appointment. Will continue to be aware to avoid the bony prominences near the trapezius such as the [facility] joint and the humeral head

## 2024-01-09 ENCOUNTER — Telehealth: Payer: Self-pay | Admitting: *Deleted

## 2024-01-09 ENCOUNTER — Encounter: Payer: Self-pay | Admitting: Internal Medicine

## 2024-01-09 ENCOUNTER — Ambulatory Visit: Payer: Medicare Other | Admitting: Internal Medicine

## 2024-01-09 ENCOUNTER — Telehealth: Payer: Self-pay | Admitting: Internal Medicine

## 2024-01-09 VITALS — BP 120/70 | HR 91 | Temp 98.3°F | Ht 60.0 in | Wt 113.0 lb

## 2024-01-09 DIAGNOSIS — M542 Cervicalgia: Secondary | ICD-10-CM | POA: Diagnosis not present

## 2024-01-09 DIAGNOSIS — I10 Essential (primary) hypertension: Secondary | ICD-10-CM | POA: Diagnosis not present

## 2024-01-09 DIAGNOSIS — M810 Age-related osteoporosis without current pathological fracture: Secondary | ICD-10-CM

## 2024-01-09 DIAGNOSIS — R350 Frequency of micturition: Secondary | ICD-10-CM

## 2024-01-09 DIAGNOSIS — Z789 Other specified health status: Secondary | ICD-10-CM

## 2024-01-09 DIAGNOSIS — R269 Unspecified abnormalities of gait and mobility: Secondary | ICD-10-CM | POA: Diagnosis not present

## 2024-01-09 DIAGNOSIS — E785 Hyperlipidemia, unspecified: Secondary | ICD-10-CM

## 2024-01-09 DIAGNOSIS — E034 Atrophy of thyroid (acquired): Secondary | ICD-10-CM

## 2024-01-09 DIAGNOSIS — J301 Allergic rhinitis due to pollen: Secondary | ICD-10-CM | POA: Diagnosis not present

## 2024-01-09 MED ORDER — METHYLPREDNISOLONE ACETATE 80 MG/ML IJ SUSP
80.0000 mg | Freq: Once | INTRAMUSCULAR | Status: AC
Start: 2024-01-09 — End: 2024-01-09
  Administered 2024-01-09: 80 mg via INTRAMUSCULAR

## 2024-01-09 MED ORDER — VITAMIN C 100 MG PO TABS: 100.0000 mg | ORAL_TABLET | Freq: Every day | ORAL | 1 refills | Status: AC

## 2024-01-09 MED ORDER — MEGARED OMEGA-3 KRILL OIL 500 MG PO CAPS: 1.0000 | ORAL_CAPSULE | Freq: Every morning | ORAL | 3 refills | Status: AC

## 2024-01-09 NOTE — Telephone Encounter (Signed)
 Patient would like to have labs before her follow up appointment.  Please put labs in system and call patient and let her know.

## 2024-01-09 NOTE — Telephone Encounter (Signed)
 Okay to take 500 mg one half tablet a day.  Thank you

## 2024-01-09 NOTE — Assessment & Plan Note (Signed)
Use cane

## 2024-01-09 NOTE — Telephone Encounter (Signed)
 Copied from CRM [id number]. Topic: Clinical - Prescription Issue >> Jan 09, 2024  3:59 PM Artemio Larry wrote: [facility] called to let Dr. Georgia Kipper know that the vitamin c  that was sent today does not come in 100mg , they only make it for 500mg 

## 2024-01-09 NOTE — Telephone Encounter (Signed)
Insurance information submitted to [employer] portal. Will await summary of benefits for prolia.    

## 2024-01-09 NOTE — Progress Notes (Signed)
 Subjective:  Patient ID: [name], female    DOB: 1936/10/07  Age: 88 y.o. MRN: [id number]  CC: Medical Management of Chronic Issues (6 week f/u, pt would like to discuss if she needs to restart Simvastatin  or not as she has been off of it over 20 days.)   HPI [name] presents for R trap pain and R arm pain is better  C/o post-nasal drip - bad, needs a shot Pt stopped Tramadol  Off statin  Patient noted good relief from shockwave therapy right after  at Dr Nolene Baumgarten' office Makailey's son wanted her not to take Tramadol  - she stopped  Outpatient Medications Prior to Visit  Medication Sig Dispense Refill   acetaminophen  (TYLENOL ) 500 MG tablet Take 1,000 mg by mouth every 6 (six) hours as needed for mild pain.     albuterol  (VENTOLIN  HFA) 108 (90 Base) MCG/ACT inhaler Take 2 puffs every 4-6 hours as needed     amLODipine  (NORVASC ) 2.5 MG tablet Take 2.5 mg by mouth as needed.     Azelastine  HCl 0.15 % SOLN Place into both nostrils. Place into both nostrils.     Calcium  Carbonate-Vitamin D  (CALCIUM  CARBONATE W/VITAMIN D  PO) Take 1 tablet by mouth daily.     ciprofloxacin  (CILOXAN ) 0.3 % ophthalmic solution INSTILL ONE DROP IN THE LEFT EYE FOUR TIMES DAILY through monday, Jan 27. THEN USE IN THE LEFT EYE TWICE DAILY through friday, January 31, THEN STOP.     cyanocobalamin  (VITAMIN B12) 1000 MCG tablet Take 1 tablet (1,000 mcg total) by mouth daily. 100 tablet 3   desonide  (DESOWEN ) 0.05 % cream Apply 1 application topically as needed.     EPINEPHrine  (EPIPEN  2-PAK) 0.3 mg/0.3 mL IJ SOAJ injection Inject 0.3 mLs (0.3 mg total) into the muscle as needed. 1 Device 1   erythromycin  ophthalmic ointment Place 1 Application into both eyes as needed.     famotidine  (PEPCID ) 40 MG tablet Take 40 mg by mouth as needed for heartburn or indigestion.     hydrocortisone  (ANUSOL -HC) 2.5 % rectal cream Place 1 Application rectally as needed for hemorrhoids or anal itching.     ipratropium (ATROVENT )  0.03 % nasal spray Place 1 spray into both nostrils at bedtime.     levocetirizine (XYZAL ) 5 MG tablet Take 5 mg by mouth every evening.     levothyroxine  (SYNTHROID ) 50 MCG tablet TAKE ONE TABLET BY MOUTH DAILY BEFORE BREAKFAST 30 tablet 5   loratadine  (CLARITIN ) 10 MG tablet Take 1 tablet (10 mg total) by mouth daily. 100 tablet 3   LORazepam  (ATIVAN ) 1 MG tablet Take 2-3 tablets at bedtime and 1-2 tablets in the daytime as needed. 150 tablet 2   memantine  (NAMENDA  XR) 14 MG CP24 24 hr capsule Take 1 capsule (14 mg total) by mouth at bedtime. 90 capsule 11   montelukast  (SINGULAIR ) 10 MG tablet Take 1 tablet (10 mg total) by mouth at bedtime. 90 tablet 1   oxymetazoline (AFRIN 12 HOUR) 0.05 % nasal spray Place 1 spray into both nostrils as needed.     pantoprazole  (PROTONIX ) 40 MG tablet Take 40 mg by mouth as needed.     polyethylene glycol powder (GLYCOLAX /MIRALAX ) 17 GM/SCOOP powder Take 17 g by mouth 2 (two) times daily as needed for moderate constipation. 500 g 3   psyllium (METAMUCIL) 58.6 % packet Take 1 packet by mouth as needed.     solifenacin  (VESICARE ) 10 MG tablet Take 1 tablet (10 mg total) by  mouth daily as needed (bladder). 90 tablet 3   triamcinolone  (NASACORT ) 55 MCG/ACT AERO nasal inhaler Place 2 sprays into the nose as needed.     triamcinolone  ointment (KENALOG ) 0.1 % Apply 1 Application topically 3 (three) times daily. As needed for itching 80 g 2   No facility-administered medications prior to visit.    ROS: Review of Systems  Constitutional:  Negative for activity change, appetite change, chills, fatigue and unexpected weight change.  HENT:  Negative for congestion, mouth sores and sinus pressure.   Eyes:  Negative for visual disturbance.  Respiratory:  Negative for cough and chest tightness.   Gastrointestinal:  Negative for abdominal pain and nausea.  Genitourinary:  Negative for difficulty urinating, frequency and vaginal pain.  Musculoskeletal:  Positive for  arthralgias, back pain, gait problem and myalgias.  Skin:  Negative for pallor and rash.  Neurological:  Negative for dizziness, tremors, weakness, numbness and headaches.  Psychiatric/Behavioral:  Negative for confusion and sleep disturbance.     Objective:  BP 120/70 (BP Location: Left Arm, Patient Position: Sitting, Cuff Size: Normal)   Pulse 91   Temp 98.3 F (36.8 C) (Oral)   Ht 5' (1.524 m)   Wt 113 lb (51.3 kg)   SpO2 94%   BMI 22.07 kg/m   BP Readings from Last 3 Encounters:  01/09/24 120/70  01/03/24 132/60  01/02/24 (!) 140/70    Wt Readings from Last 3 Encounters:  01/09/24 113 lb (51.3 kg)  01/03/24 113 lb (51.3 kg)  01/02/24 115 lb 3.2 oz (52.3 kg)    Physical Exam Constitutional:      General: She is not in acute distress.    Appearance: Normal appearance. She is well-developed.  HENT:     Head: Normocephalic.     Right Ear: External ear normal.     Left Ear: External ear normal.     Nose: Nose normal.  Eyes:     General:        Right eye: No discharge.        Left eye: No discharge.     Conjunctiva/sclera: Conjunctivae normal.     Pupils: Pupils are equal, round, and reactive to light.  Neck:     Thyroid : No thyromegaly.     Vascular: No JVD.     Trachea: No tracheal deviation.  Cardiovascular:     Rate and Rhythm: Normal rate and regular rhythm.     Heart sounds: Normal heart sounds.  Pulmonary:     Effort: No respiratory distress.     Breath sounds: No stridor. No wheezing.  Abdominal:     General: Bowel sounds are normal. There is no distension.     Palpations: Abdomen is soft. There is no mass.     Tenderness: There is no abdominal tenderness. There is no guarding or rebound.  Musculoskeletal:        General: No tenderness.     Cervical back: Normal range of motion and neck supple. No rigidity.  Lymphadenopathy:     Cervical: No cervical adenopathy.  Skin:    Findings: No erythema or rash.  Neurological:     Cranial Nerves: No  cranial nerve deficit.     Motor: No abnormal muscle tone.     Coordination: Coordination normal.     Deep Tendon Reflexes: Reflexes normal.  Psychiatric:        Behavior: Behavior normal.        Thought Content: Thought content normal.  Judgment: Judgment normal.   R trap w/pain Using a cane  Lab Results  Component Value Date   WBC 7.5 10/05/2023   HGB 12.8 10/05/2023   HCT 39.6 10/05/2023   PLT 267.0 10/05/2023   GLUCOSE 89 12/13/2023   CHOL 228 (H) 12/13/2023   TRIG 159.0 (H) 12/13/2023   HDL 51.80 12/13/2023   LDLDIRECT 98.0 08/13/2021   LDLCALC 144 (H) 12/13/2023   ALT 17 12/13/2023   AST 17 12/13/2023   NA 134 (L) 12/13/2023   K 3.7 12/13/2023   CL 98 12/13/2023   CREATININE 0.76 12/13/2023   BUN 14 12/13/2023   CO2 26 12/13/2023   TSH 1.70 09/12/2023   HGBA1C 5.5 06/13/2020    ECHOCARDIOGRAM COMPLETE Result Date: 01/02/2024    ECHOCARDIOGRAM REPORT   Patient Name:   [name] Date of Exam: 01/02/2024 Medical Rec #:  [id number]     Height:       60.0 in Accession #:    [id number]    Weight:       113.0 lb Date of Birth:  [birth year]      BSA:          1.464 m Patient Age:    87 years      BP:           120/70 mmHg Patient Gender: F             HR:           83 bpm. Exam Location:  Outpatient Procedure: 2D Echo, Cardiac Doppler and Color Doppler Indications:    CHF  History:        Patient has prior history of Echocardiogram examinations, most                 recent 02/11/2023. CHF, Signs/Symptoms:Hypotension and Fatigue;                 Risk Factors:Dyslipidemia.  Sonographer:    Aldon Ambrosia Referring Phys: [id number] DANIEL R BENSIMHON IMPRESSIONS  1. Left ventricular ejection fraction, by estimation, is 55 to 60%. The left ventricle has normal function. The left ventricle has no regional wall motion abnormalities. Left ventricular diastolic parameters are consistent with Grade I diastolic dysfunction (impaired relaxation).  2. Right ventricular systolic function is normal.  The right ventricular size is normal. There is normal pulmonary artery systolic pressure.  3. The mitral valve is abnormal. Mild mitral valve regurgitation. No evidence of mitral stenosis. Moderate mitral annular calcification.  4. The aortic valve is tricuspid. There is moderate calcification of the aortic valve. There is moderate thickening of the aortic valve. Aortic valve regurgitation is moderate. Mild aortic valve stenosis.  5. The inferior vena cava is normal in size with greater than 50% respiratory variability, suggesting right atrial pressure of 3 mmHg. FINDINGS  Left Ventricle: Left ventricular ejection fraction, by estimation, is 55 to 60%. The left ventricle has normal function. The left ventricle has no regional wall motion abnormalities. The left ventricular internal cavity size was normal in size. There is  no left ventricular hypertrophy. Left ventricular diastolic parameters are consistent with Grade I diastolic dysfunction (impaired relaxation). Right Ventricle: The right ventricular size is normal. No increase in right ventricular wall thickness. Right ventricular systolic function is normal. There is normal pulmonary artery systolic pressure. The tricuspid regurgitant velocity is 2.60 m/s, and  with an assumed right atrial pressure of 3 mmHg, the estimated right ventricular systolic pressure is 30.0 mmHg. Left Atrium:  Left atrial size was normal in size. Right Atrium: Right atrial size was normal in size. Pericardium: There is no evidence of pericardial effusion. Mitral Valve: The mitral valve is abnormal. There is mild thickening of the mitral valve leaflet(s). There is mild calcification of the mitral valve leaflet(s). Moderate mitral annular calcification. Mild mitral valve regurgitation. No evidence of mitral  valve stenosis. MV peak gradient, 2.5 mmHg. The mean mitral valve gradient is 1.0 mmHg. Tricuspid Valve: The tricuspid valve is normal in structure. Tricuspid valve regurgitation is  mild . No evidence of tricuspid stenosis. Aortic Valve: The aortic valve is tricuspid. There is moderate calcification of the aortic valve. There is moderate thickening of the aortic valve. Aortic valve regurgitation is moderate. Aortic regurgitation PHT measures 357 msec. Mild aortic stenosis is present. Aortic valve mean gradient measures 6.0 mmHg. Aortic valve peak gradient measures 11.4 mmHg. Aortic valve area, by VTI measures 2.06 cm. Pulmonic Valve: The pulmonic valve was normal in structure. Pulmonic valve regurgitation is not visualized. No evidence of pulmonic stenosis. Aorta: The aortic root is normal in size and structure. Venous: The inferior vena cava is normal in size with greater than 50% respiratory variability, suggesting right atrial pressure of 3 mmHg. IAS/Shunts: No atrial level shunt detected by color flow Doppler.  LEFT VENTRICLE PLAX 2D LVIDd:         4.20 cm      Diastology LVIDs:         2.80 cm      LV e' medial:    9.57 cm/s LV PW:         0.70 cm      LV E/e' medial:  5.5 LV IVS:        0.70 cm      LV e' lateral:   9.14 cm/s LVOT diam:     2.00 cm      LV E/e' lateral: 5.7 LV SV:         57 LV SV Index:   39 LVOT Area:     3.14 cm  LV Volumes (MOD) LV vol d, MOD A2C: 107.0 ml LV vol d, MOD A4C: 84.4 ml LV vol s, MOD A2C: 30.5 ml LV vol s, MOD A4C: 24.8 ml LV SV MOD A2C:     76.5 ml LV SV MOD A4C:     84.4 ml LV SV MOD BP:      68.1 ml RIGHT VENTRICLE             IVC RV Basal diam:  3.70 cm     IVC diam: 1.30 cm RV Mid diam:    2.50 cm RV S prime:     15.50 cm/s TAPSE (M-mode): 2.3 cm LEFT ATRIUM             Index        RIGHT ATRIUM          Index LA diam:        3.20 cm 2.19 cm/m   RA Area:     9.05 cm LA Vol (A2C):   23.2 ml 15.84 ml/m  RA Volume:   17.50 ml 11.95 ml/m LA Vol (A4C):   24.2 ml 16.53 ml/m LA Biplane Vol: 26.1 ml 17.82 ml/m  AORTIC VALVE                     PULMONIC VALVE AV Area (Vmax):    1.83 cm      PV Vmax:  0.70 m/s AV Area (Vmean):   1.81 cm      PV  Peak grad:  2.0 mmHg AV Area (VTI):     2.06 cm AV Vmax:           169.00 cm/s AV Vmean:          110.000 cm/s AV VTI:            0.279 m AV Peak Grad:      11.4 mmHg AV Mean Grad:      6.0 mmHg LVOT Vmax:         98.20 cm/s LVOT Vmean:        63.400 cm/s LVOT VTI:          0.183 m LVOT/AV VTI ratio: 0.66 AI PHT:            357 msec  AORTA Ao Root diam: 2.80 cm Ao Asc diam:  2.40 cm MITRAL VALVE               TRICUSPID VALVE MV Area (PHT): 3.70 cm    TR Peak grad:   27.0 mmHg MV Area VTI:   3.32 cm    TR Vmax:        260.00 cm/s MV Peak grad:  2.5 mmHg MV Mean grad:  1.0 mmHg    SHUNTS MV Vmax:       0.80 m/s    Systemic VTI:  0.18 m MV Vmean:      53.8 cm/s   Systemic Diam: 2.00 cm MV Decel Time: 205 msec MV E velocity: 52.40 cm/s MV A velocity: 83.80 cm/s MV E/A ratio:  0.63 Janelle Mediate MD Electronically signed by Janelle Mediate MD Signature Date/Time: 01/02/2024/2:52:03 PM    Final     Assessment & Plan:   Problem List Items Addressed This Visit     Essential hypertension, benign   Low salt intake Take Norvasc  2.5 mg daily  SBP 140-150 at home      Neck pain - Primary   Patient noted good relief from shockwave therapy right after  at Dr Nolene Baumgarten' office Charlynn's son wanted her not to take Tramadol  - she stopped.        Allergic rhinitis due to pollen   Worse Will give Depo 80 mg Im  Potential benefits/risks  of a steroid use were discussed       Gait disorder   Use cane      Statin intolerance   We d/c'd Simvastatin  Start Krill oil         Meds ordered this encounter  Medications   MegaRed Omega-3 Krill Oil 500 MG CAPS    Sig: Take 1 capsule by mouth every morning.    Dispense:  100 capsule    Refill:  3   Ascorbic Acid (VITAMIN C ) 100 MG tablet    Sig: Take 1 tablet (100 mg total) by mouth daily.    Dispense:  100 tablet    Refill:  1   methylPREDNISolone  acetate (DEPO-MEDROL ) injection 80 mg      Follow-up: Return in about 3 months (around 04/07/2024) for a  follow-up visit.  Anitra Barn, MD

## 2024-01-09 NOTE — Assessment & Plan Note (Addendum)
 Patient noted good relief from shockwave therapy right after  at Dr Nolene Baumgarten' office Akasia's son wanted her not to take Tramadol  - she stopped.

## 2024-01-09 NOTE — Assessment & Plan Note (Signed)
 Low salt intake Take Norvasc 2.5 mg daily  SBP 140-150 at home

## 2024-01-09 NOTE — Patient Instructions (Addendum)
 You had a shockwave therapy  Blue-Emu cream -  use 2-3 times a day

## 2024-01-09 NOTE — Assessment & Plan Note (Addendum)
 We d/c'd Simvastatin  Start Krill oil

## 2024-01-09 NOTE — Assessment & Plan Note (Signed)
 Worse Will give Depo 80 mg Im  Potential benefits/risks  of a steroid use were discussed

## 2024-01-10 ENCOUNTER — Ambulatory Visit: Payer: Self-pay | Admitting: Sports Medicine

## 2024-01-10 MED ORDER — DENOSUMAB 60 MG/ML ~~LOC~~ SOSY
60.0000 mg | PREFILLED_SYRINGE | Freq: Once | SUBCUTANEOUS | Status: AC
  Administered 2024-02-01: 60 mg via SUBCUTANEOUS

## 2024-01-10 NOTE — Telephone Encounter (Signed)
No, OK to go ahead and schedule Prolia. Thanks for checking.

## 2024-01-10 NOTE — Telephone Encounter (Signed)
Deductible:  $257 ($257 met)   OOP MAX: none   Annual exam: 09/29/22 JJ   Calcium:  9.3          Date: 12/13/23  Upcoming dental procedures: No   Hx of Kidney Disease: No   Last Bone Density Scan: 11/02/22   Is Prior Authorization needed: no  Pt estimated Cost: $0    Coverage Details: For the secondary MD purchase access option, this is a Medicare Supplement Plan J and it covers the Medicare Part B deductible, coinsurance and 100% of excess charges.

## 2024-01-10 NOTE — Telephone Encounter (Signed)
Call to patient. Benefits reviewed for prolia with patient and she verbalized understanding. Nurse visit scheduled for 02/01/24 at 1145. Patient agreeable to date and time of appointment. Order for prolia placed and summary of benefits scanned into Epic.   Encounter closed.

## 2024-01-10 NOTE — Telephone Encounter (Signed)
Does patient need breast and pelvic exam prior to scheduling prolia injection? Last AEX was 09/29/22.   Routing to provider for review.

## 2024-01-11 DIAGNOSIS — H0015 Chalazion left lower eyelid: Secondary | ICD-10-CM | POA: Diagnosis not present

## 2024-01-11 DIAGNOSIS — Z961 Presence of intraocular lens: Secondary | ICD-10-CM | POA: Diagnosis not present

## 2024-01-11 DIAGNOSIS — H532 Diplopia: Secondary | ICD-10-CM | POA: Diagnosis not present

## 2024-01-11 DIAGNOSIS — H524 Presbyopia: Secondary | ICD-10-CM | POA: Diagnosis not present

## 2024-01-12 ENCOUNTER — Ambulatory Visit: Payer: Medicare Other | Admitting: Internal Medicine

## 2024-01-12 ENCOUNTER — Ambulatory Visit: Payer: Self-pay | Admitting: Sports Medicine

## 2024-01-12 ENCOUNTER — Ambulatory Visit (INDEPENDENT_AMBULATORY_CARE_PROVIDER_SITE_OTHER): Payer: Self-pay | Admitting: Sports Medicine

## 2024-01-12 DIAGNOSIS — M5030 Other cervical disc degeneration, unspecified cervical region: Secondary | ICD-10-CM

## 2024-01-12 NOTE — Telephone Encounter (Signed)
OK. Thx

## 2024-01-12 NOTE — Progress Notes (Signed)
ESWT #3  2/4 1000 impulses 2/7  1500 impulses  Location RT Trapezius Impulses: 2200 Freq: 12 Power 80 mJ (pain at 90 mJ) Large Head size  Patient tolerated procedure well Now able to use her RT arm again Still ROM with RT arm is somewhat less than left Warned about possible bruising  Repeat in 1 week at same level  Sterling Big, MD

## 2024-01-12 NOTE — Assessment & Plan Note (Signed)
Responding well to ESWT Plan is for 6 and then as needed #3 today

## 2024-01-17 ENCOUNTER — Ambulatory Visit: Payer: Self-pay | Admitting: Sports Medicine

## 2024-01-19 ENCOUNTER — Ambulatory Visit: Payer: Self-pay | Admitting: Sports Medicine

## 2024-01-19 ENCOUNTER — Ambulatory Visit (INDEPENDENT_AMBULATORY_CARE_PROVIDER_SITE_OTHER): Payer: Self-pay | Admitting: Sports Medicine

## 2024-01-19 VITALS — BP 141/69 | Ht 60.0 in | Wt 113.0 lb

## 2024-01-19 DIAGNOSIS — M5030 Other cervical disc degeneration, unspecified cervical region: Secondary | ICD-10-CM

## 2024-01-19 NOTE — Patient Instructions (Addendum)
We discussed adding tramadol in for some pain relief.  Start with the 1 tablet (50mg ) in the morning along with tylenol 2 extra strength tablets (1000mg ). If you tolerate the Tramadol without any sedation or dizziness you can add a dose in the evening with another dose of tylenol 2 extra strength tabs.  [Max daily tylenol is tylenol 1000mg  every 8 hours]  Can start some very lightweight dumbbell work for your shoulder (flexion, internal rotation, external rotation).  Let's follow-up in 3 weeks.

## 2024-01-19 NOTE — Assessment & Plan Note (Signed)
Known extensive cervical DDD s/p fusion from occiput to C4 in 04/2021. Reviewed with the patient her x-rays from 10/2023. She had resolution of her more pronounced radicular sxs and R shoulder pain with extracorporeal shockwave therapy, though still getting significant pain and functional limitation along the right side of her neck and across her trapezius.  Plan: -Add on low-dose Tramadol for pain relief, start 50mg  in the AM and can go up to BID as tolerate. Has supply at home, will message [place] once current supply is gone. -Continue Tylenol 1000mg  TID and Aspercreme up to QID -Encouraged adding some light weight for R shoulder exercises with her trainer. -F/u in 3 weeks

## 2024-01-19 NOTE — Progress Notes (Signed)
PCP: Plotnikov, Georgina Quint, MD  SUBJECTIVE:   HPI:  Patient is a 88 y.o. female here for follow-up of right neck and radicular pain.  She has recently had 3 shockwave sessions targeting her right trapezius. She notes resolution of the radicular pain down the right arm and limited shoulder ROM, though notes persistent pain and stiffness across the right side of her neck, along her trapezius, and on the right shoulder blade. She has been using Tylenol 1000mg  TID and Aspercreme with modest relief over the past few days.   She has known severe DDD in c-spine and had posterior cervical laminectomy with fusion from occiput to C4 in 04/2021. Reports taking Tramadol postoperatively without issues.   Pertinent ROS were reviewed with the patient and found to be negative unless otherwise specified above in HPI.   PERTINENT  PMH / PSH / FH / SH:  Past Medical, Surgical, Social, and Family History Reviewed & Updated in the EMR.  Pertinent findings include:  Severe Cervical DDD as above.  Current Outpatient Medications on File Prior to Visit  Medication Sig Dispense Refill   acetaminophen (TYLENOL) 500 MG tablet Take 1,000 mg by mouth every 6 (six) hours as needed for mild pain.     albuterol (VENTOLIN HFA) 108 (90 Base) MCG/ACT inhaler Take 2 puffs every 4-6 hours as needed     amLODipine (NORVASC) 2.5 MG tablet Take 2.5 mg by mouth as needed.     Ascorbic Acid (VITAMIN C) 100 MG tablet Take 1 tablet (100 mg total) by mouth daily. 100 tablet 1   Azelastine HCl 0.15 % SOLN Place into both nostrils. Place into both nostrils.     Calcium Carbonate-Vitamin D (CALCIUM CARBONATE W/VITAMIN D PO) Take 1 tablet by mouth daily.     ciprofloxacin (CILOXAN) 0.3 % ophthalmic solution INSTILL ONE DROP IN THE LEFT EYE FOUR TIMES DAILY through monday, Jan 27. THEN USE IN THE LEFT EYE TWICE DAILY through friday, January 31, THEN STOP.     cyanocobalamin (VITAMIN B12) 1000 MCG tablet Take 1 tablet (1,000 mcg total) by  mouth daily. 100 tablet 3   desonide (DESOWEN) 0.05 % cream Apply 1 application topically as needed.     EPINEPHrine (EPIPEN 2-PAK) 0.3 mg/0.3 mL IJ SOAJ injection Inject 0.3 mLs (0.3 mg total) into the muscle as needed. 1 Device 1   erythromycin ophthalmic ointment Place 1 Application into both eyes as needed.     famotidine (PEPCID) 40 MG tablet Take 40 mg by mouth as needed for heartburn or indigestion.     hydrocortisone (ANUSOL-HC) 2.5 % rectal cream Place 1 Application rectally as needed for hemorrhoids or anal itching.     ipratropium (ATROVENT) 0.03 % nasal spray Place 1 spray into both nostrils at bedtime.     levocetirizine (XYZAL) 5 MG tablet Take 5 mg by mouth every evening.     levothyroxine (SYNTHROID) 50 MCG tablet TAKE ONE TABLET BY MOUTH DAILY BEFORE BREAKFAST 30 tablet 5   loratadine (CLARITIN) 10 MG tablet Take 1 tablet (10 mg total) by mouth daily. 100 tablet 3   LORazepam (ATIVAN) 1 MG tablet Take 2-3 tablets at bedtime and 1-2 tablets in the daytime as needed. 150 tablet 2   MegaRed Omega-3 Krill Oil 500 MG CAPS Take 1 capsule by mouth every morning. 100 capsule 3   memantine (NAMENDA XR) 14 MG CP24 24 hr capsule Take 1 capsule (14 mg total) by mouth at bedtime. 90 capsule 11   montelukast (  SINGULAIR) 10 MG tablet Take 1 tablet (10 mg total) by mouth at bedtime. 90 tablet 1   oxymetazoline (AFRIN 12 HOUR) 0.05 % nasal spray Place 1 spray into both nostrils as needed.     pantoprazole (PROTONIX) 40 MG tablet Take 40 mg by mouth as needed.     polyethylene glycol powder (GLYCOLAX/MIRALAX) 17 GM/SCOOP powder Take 17 g by mouth 2 (two) times daily as needed for moderate constipation. 500 g 3   psyllium (METAMUCIL) 58.6 % packet Take 1 packet by mouth as needed.     solifenacin (VESICARE) 10 MG tablet Take 1 tablet (10 mg total) by mouth daily as needed (bladder). 90 tablet 3   triamcinolone (NASACORT) 55 MCG/ACT AERO nasal inhaler Place 2 sprays into the nose as needed.      triamcinolone ointment (KENALOG) 0.1 % Apply 1 Application topically 3 (three) times daily. As needed for itching 80 g 2   Current Facility-Administered Medications on File Prior to Visit  Medication Dose Route Frequency Provider Last Rate Last Admin   [START ON 02/01/2024] denosumab (PROLIA) injection 60 mg  60 mg Subcutaneous Once Earlene Plater, Tiffany A, NP        Allergies  Allergen Reactions   Bee Venom Itching, Swelling and Rash    Itching, swelling and rash with bee stings, patient has epi pen   Bacitracin-Polymyxin B Other (See Comments)   Clarithromycin Other (See Comments)   Diclofenac     ??rash from Voltaren gel   Doxycycline     ??   Neosporin [Neomycin-Bacitracin Zn-Polymyx]    Hydrocortisone Rash    OBJECTIVE:  BP (!) 141/69   Ht 5' (1.524 m)   Wt 113 lb (51.3 kg)   BMI 22.07 kg/m   PHYSICAL EXAM:  GEN: Alert and Oriented, NAD, comfortable in exam room RESP: Unlabored respirations, symmetric chest rise PSY: normal mood, congruent affect   MSK EXAM: Markedly limited ROM of c-spine. Cannot extend from rest. Minimal rotation or side flexion. She has notable scar from occiput down to lower cervical vertebra posteriorly with some dry skin. She is TTP along the right paracervical m, trapezius out to the acromion, along the medial border of right scapula. Mildly TTP left trap.  Right shoulder ROM improved. Flexion 100d, ER 40d, IR L4. Weakness with all RTC testing. Negative impingement testing.   Assessment & Plan Degenerative disc disease, cervical Known extensive cervical DDD s/p fusion from occiput to C4 in 04/2021. Reviewed with the patient her x-rays from 10/2023. She had resolution of her more pronounced radicular sxs and R shoulder pain with extracorporeal shockwave therapy, though still getting significant pain and functional limitation along the right side of her neck and across her trapezius.  Plan: -Add on low-dose Tramadol for pain relief, start 50mg  in the AM  and can go up to BID as tolerate. Has supply at home, will message [place] once current supply is gone. -Continue Tylenol 1000mg  TID and Aspercreme up to QID -Encouraged adding some light weight for R shoulder exercises with her trainer. -F/u in 3 weeks   Glean Salen, MD PGY-4, Sports Medicine Fellow [facility]  I observed and examined the patient with the [facility] resident and agree with assessment and plan.  Note reviewed and modified by me.  We tried the ESWT to avoid using tramadol but now she is asking to try tramadol as her pain has returned and is worse.  We advised caution on using this even though she has  used in the past.  Stop if any dizziness or sedation. We may be able to try ESWT again but need to keep energy level to 60 mJ as the last session I did caused her to have soreness for 2 days.  Sterling Big, MD

## 2024-01-23 ENCOUNTER — Ambulatory Visit: Payer: Self-pay | Admitting: Internal Medicine

## 2024-01-23 ENCOUNTER — Telehealth: Payer: Self-pay

## 2024-01-23 NOTE — Telephone Encounter (Signed)
  Chief Complaint: chest pain with post nasal drip Symptoms: pain, congestion, post nasal drip Frequency: constant Pertinent Negatives: Patient denies "heart pain" chest pain, difficulty breathing, cough Disposition: [] ED /[] Urgent Care (no appt availability in office) / [x] Appointment(In office/virtual)/ []  [facility]/ [] Home Care/ [] Refused Recommended Disposition /[] [facility]/ []  Follow-up with PCP Additional Notes: apt made for tomorrow. Would like to be seen today.  States she has had this before and this is not "heart" chest pain but pain in the chest due to "post nasal drip".  Apt made for tomorrow.  Instructed to go to the er if becomes worse.   Reason for Disposition  [1] Sinus congestion (pressure, fullness) AND [2] present > 10 days  Answer Assessment - Initial Assessment Questions 1. ONSET: "When did the nasal discharge start?"      A week ago 2. AMOUNT: "How much discharge is there?"      Post nasal drip 3. COUGH: "Do you have a cough?" If Yes, ask: "Describe the color of your sputum" (clear, white, yellow, green)     denies 4. RESPIRATORY DISTRESS: "Describe your breathing."      denies 5. FEVER: "Do you have a fever?" If Yes, ask: "What is your temperature, how was it measured, and when did it start?"     denies 6. SEVERITY: "Overall, how bad are you feeling right now?" (e.g., doesn't interfere with normal activities, staying home from school/work, staying in bed)      Post nasal drip 7. OTHER SYMPTOMS: "Do you have any other symptoms?" (e.g., sore throat, earache, wheezing, vomiting)     Chest pain states from post nasal drip 8. PREGNANCY: "Is there any chance you are pregnant?" "When was your last menstrual period?"     Na.  Protocols used: Common Cold-A-AH

## 2024-01-23 NOTE — Telephone Encounter (Signed)
 Called and advised pt Dr. Macario Golds is not in office today but can be seen by another provider, pt states she will wait to see Dr. Macario Golds tomorrow

## 2024-01-23 NOTE — Telephone Encounter (Signed)
 Called and advised [name] Dr. Macario Golds is off today and tomorrow is his soonest appt. [name] verbalizes she will wait to see Dr. Macario Golds tomorrow

## 2024-01-23 NOTE — Telephone Encounter (Signed)
 Copied from CRM [id number]. Topic: Clinical - Medical Advice >> Jan 23, 2024  8:41 AM Almira Coaster wrote:  Reason for CRM: Patient is calling to speak with Dr.Plotnikov or his nurse regarding possible bronchitis, she was scheduled for an appointment tomorrow at 4 pm; however, she would like to come in and see Dr.Plotnikov. Best call back number is [phone]

## 2024-01-23 NOTE — Telephone Encounter (Signed)
 Unable to hear patient during warm transfer from call agent, attempted 1st call back. Left voicemail for patient to return call for triage.  Copied from CRM [id number]. Topic: Clinical - Red Word Triage >> Jan 23, 2024  8:03 AM [name] wrote: Red Word that prompted transfer to Nurse Triage: Chest Pain

## 2024-01-24 ENCOUNTER — Encounter: Payer: Self-pay | Admitting: Internal Medicine

## 2024-01-24 ENCOUNTER — Ambulatory Visit (INDEPENDENT_AMBULATORY_CARE_PROVIDER_SITE_OTHER): Payer: Medicare Other | Admitting: Internal Medicine

## 2024-01-24 VITALS — BP 118/68 | HR 81 | Temp 98.6°F | Ht 60.0 in | Wt 113.0 lb

## 2024-01-24 DIAGNOSIS — I10 Essential (primary) hypertension: Secondary | ICD-10-CM

## 2024-01-24 DIAGNOSIS — Z789 Other specified health status: Secondary | ICD-10-CM

## 2024-01-24 DIAGNOSIS — M25511 Pain in right shoulder: Secondary | ICD-10-CM | POA: Diagnosis not present

## 2024-01-24 DIAGNOSIS — E034 Atrophy of thyroid (acquired): Secondary | ICD-10-CM | POA: Diagnosis not present

## 2024-01-24 DIAGNOSIS — J309 Allergic rhinitis, unspecified: Secondary | ICD-10-CM | POA: Insufficient documentation

## 2024-01-24 DIAGNOSIS — R062 Wheezing: Secondary | ICD-10-CM | POA: Insufficient documentation

## 2024-01-24 MED ORDER — METHYLPREDNISOLONE ACETATE 80 MG/ML IJ SUSP
80.0000 mg | Freq: Once | INTRAMUSCULAR | Status: AC
Start: 2024-01-24 — End: 2024-01-24
  Administered 2024-01-24: 80 mg via INTRAMUSCULAR

## 2024-01-24 MED ORDER — AZITHROMYCIN 250 MG PO TABS: ORAL_TABLET | ORAL | 0 refills | Status: DC

## 2024-01-24 NOTE — Progress Notes (Signed)
 Subjective:  Patient ID: [name], female    DOB: 1936-09-24  Age: 88 y.o. MRN: [id number]  CC: Medical Management of Chronic Issues (Pt is not feeling well and having chest pains due to post nasal drip and cough)   HPI [name] presents for URI sx's x 1 week C/o R shoulder pain - tripped and fell on the R arm on Sunday - MJ show  Outpatient Medications Prior to Visit  Medication Sig Dispense Refill   acetaminophen (TYLENOL) 500 MG tablet Take 1,000 mg by mouth every 6 (six) hours as needed for mild pain.     albuterol (VENTOLIN HFA) 108 (90 Base) MCG/ACT inhaler Take 2 puffs every 4-6 hours as needed     amLODipine (NORVASC) 2.5 MG tablet Take 2.5 mg by mouth as needed.     Ascorbic Acid (VITAMIN C) 100 MG tablet Take 1 tablet (100 mg total) by mouth daily. 100 tablet 1   Azelastine HCl 0.15 % SOLN Place into both nostrils. Place into both nostrils.     Calcium Carbonate-Vitamin D (CALCIUM CARBONATE W/VITAMIN D PO) Take 1 tablet by mouth daily.     ciprofloxacin (CILOXAN) 0.3 % ophthalmic solution INSTILL ONE DROP IN THE LEFT EYE FOUR TIMES DAILY through monday, Jan 27. THEN USE IN THE LEFT EYE TWICE DAILY through friday, January 31, THEN STOP.     cyanocobalamin (VITAMIN B12) 1000 MCG tablet Take 1 tablet (1,000 mcg total) by mouth daily. 100 tablet 3   desonide (DESOWEN) 0.05 % cream Apply 1 application topically as needed.     EPINEPHrine (EPIPEN 2-PAK) 0.3 mg/0.3 mL IJ SOAJ injection Inject 0.3 mLs (0.3 mg total) into the muscle as needed. 1 Device 1   erythromycin ophthalmic ointment Place 1 Application into both eyes as needed.     famotidine (PEPCID) 40 MG tablet Take 40 mg by mouth as needed for heartburn or indigestion.     hydrocortisone (ANUSOL-HC) 2.5 % rectal cream Place 1 Application rectally as needed for hemorrhoids or anal itching.     ipratropium (ATROVENT) 0.03 % nasal spray Place 1 spray into both nostrils at bedtime.     levocetirizine (XYZAL) 5 MG tablet  Take 5 mg by mouth every evening.     levothyroxine (SYNTHROID) 50 MCG tablet TAKE ONE TABLET BY MOUTH DAILY BEFORE BREAKFAST 30 tablet 5   loratadine (CLARITIN) 10 MG tablet Take 1 tablet (10 mg total) by mouth daily. 100 tablet 3   LORazepam (ATIVAN) 1 MG tablet Take 2-3 tablets at bedtime and 1-2 tablets in the daytime as needed. 150 tablet 2   MegaRed Omega-3 Krill Oil 500 MG CAPS Take 1 capsule by mouth every morning. 100 capsule 3   memantine (NAMENDA XR) 14 MG CP24 24 hr capsule Take 1 capsule (14 mg total) by mouth at bedtime. 90 capsule 11   montelukast (SINGULAIR) 10 MG tablet Take 1 tablet (10 mg total) by mouth at bedtime. 90 tablet 1   oxymetazoline (AFRIN 12 HOUR) 0.05 % nasal spray Place 1 spray into both nostrils as needed.     pantoprazole (PROTONIX) 40 MG tablet Take 40 mg by mouth as needed.     polyethylene glycol powder (GLYCOLAX/MIRALAX) 17 GM/SCOOP powder Take 17 g by mouth 2 (two) times daily as needed for moderate constipation. 500 g 3   psyllium (METAMUCIL) 58.6 % packet Take 1 packet by mouth as needed.     solifenacin (VESICARE) 10 MG tablet Take 1 tablet (10  mg total) by mouth daily as needed (bladder). 90 tablet 3   triamcinolone (NASACORT) 55 MCG/ACT AERO nasal inhaler Place 2 sprays into the nose as needed.     triamcinolone ointment (KENALOG) 0.1 % Apply 1 Application topically 3 (three) times daily. As needed for itching 80 g 2   Facility-Administered Medications Prior to Visit  Medication Dose Route Frequency Provider Last Rate Last Admin   [START ON 02/01/2024] denosumab (PROLIA) injection 60 mg  60 mg Subcutaneous Once Wyline Beady A, NP        ROS: Review of Systems  Constitutional:  Positive for chills. Negative for activity change, appetite change, fatigue and unexpected weight change.  HENT:  Positive for postnasal drip and sinus pressure. Negative for congestion and mouth sores.   Eyes:  Negative for visual disturbance.  Respiratory:  Positive for  cough and chest tightness. Negative for shortness of breath and wheezing.   Gastrointestinal:  Negative for abdominal pain and nausea.  Genitourinary:  Negative for difficulty urinating, frequency and vaginal pain.  Musculoskeletal:  Positive for arthralgias and back pain. Negative for gait problem.  Skin:  Negative for pallor and rash.  Neurological:  Negative for dizziness, tremors, weakness, numbness and headaches.  Psychiatric/Behavioral:  Negative for confusion, sleep disturbance and suicidal ideas.     Objective:  BP 118/68   Pulse 81   Temp 98.6 F (37 C) (Oral)   Ht 5' (1.524 m)   Wt 113 lb (51.3 kg)   SpO2 96%   BMI 22.07 kg/m   BP Readings from Last 3 Encounters:  01/24/24 118/68  01/19/24 (!) 141/69  01/09/24 120/70    Wt Readings from Last 3 Encounters:  01/24/24 113 lb (51.3 kg)  01/19/24 113 lb (51.3 kg)  01/09/24 113 lb (51.3 kg)    Physical Exam Constitutional:      General: She is not in acute distress.    Appearance: She is well-developed.  HENT:     Head: Normocephalic.     Right Ear: External ear normal.     Left Ear: External ear normal.     Nose: Nose normal.  Eyes:     General:        Right eye: No discharge.        Left eye: No discharge.     Conjunctiva/sclera: Conjunctivae normal.     Pupils: Pupils are equal, round, and reactive to light.  Neck:     Thyroid: No thyromegaly.     Vascular: No JVD.     Trachea: No tracheal deviation.  Cardiovascular:     Rate and Rhythm: Normal rate and regular rhythm.     Heart sounds: Normal heart sounds.  Pulmonary:     Effort: No respiratory distress.     Breath sounds: No stridor. No wheezing.  Abdominal:     General: Bowel sounds are normal. There is no distension.     Palpations: Abdomen is soft. There is no mass.     Tenderness: There is no abdominal tenderness. There is no guarding or rebound.  Musculoskeletal:        General: No tenderness.     Cervical back: Normal range of motion and  neck supple. No rigidity.     Right lower leg: No edema.     Left lower leg: No edema.  Lymphadenopathy:     Cervical: No cervical adenopathy.  Skin:    Findings: No erythema or rash.  Neurological:     Mental Status: Mental  status is at baseline.     Cranial Nerves: No cranial nerve deficit.     Motor: No abnormal muscle tone.     Coordination: Coordination normal.     Deep Tendon Reflexes: Reflexes normal.  Psychiatric:        Behavior: Behavior normal.        Thought Content: Thought content normal.        Judgment: Judgment normal.     Lab Results  Component Value Date   WBC 7.5 10/05/2023   HGB 12.8 10/05/2023   HCT 39.6 10/05/2023   PLT 267.0 10/05/2023   GLUCOSE 89 12/13/2023   CHOL 228 (H) 12/13/2023   TRIG 159.0 (H) 12/13/2023   HDL 51.80 12/13/2023   LDLDIRECT 98.0 08/13/2021   LDLCALC 144 (H) 12/13/2023   ALT 17 12/13/2023   AST 17 12/13/2023   NA 134 (L) 12/13/2023   K 3.7 12/13/2023   CL 98 12/13/2023   CREATININE 0.76 12/13/2023   BUN 14 12/13/2023   CO2 26 12/13/2023   TSH 1.70 09/12/2023   HGBA1C 5.5 06/13/2020    ECHOCARDIOGRAM COMPLETE Result Date: 01/02/2024    ECHOCARDIOGRAM REPORT   Patient Name:   XCARET MORAD Date of Exam: 01/02/2024 Medical Rec #:  [id number]     Height:       60.0 in Accession #:    [id number]    Weight:       113.0 lb Date of Birth:  Apr 27, 1936      BSA:          1.464 m Patient Age:    87 years      BP:           120/70 mmHg Patient Gender: F             HR:           83 bpm. Exam Location:  Outpatient Procedure: 2D Echo, Cardiac Doppler and Color Doppler Indications:    CHF  History:        Patient has prior history of Echocardiogram examinations, most                 recent 02/11/2023. CHF, Signs/Symptoms:Hypotension and Fatigue;                 Risk Factors:Dyslipidemia.  Sonographer:    Vern Claude Referring Phys: [id number] DANIEL R BENSIMHON IMPRESSIONS  1. Left ventricular ejection fraction, by estimation, is 55 to 60%. The left  ventricle has normal function. The left ventricle has no regional wall motion abnormalities. Left ventricular diastolic parameters are consistent with Grade I diastolic dysfunction (impaired relaxation).  2. Right ventricular systolic function is normal. The right ventricular size is normal. There is normal pulmonary artery systolic pressure.  3. The mitral valve is abnormal. Mild mitral valve regurgitation. No evidence of mitral stenosis. Moderate mitral annular calcification.  4. The aortic valve is tricuspid. There is moderate calcification of the aortic valve. There is moderate thickening of the aortic valve. Aortic valve regurgitation is moderate. Mild aortic valve stenosis.  5. The inferior vena cava is normal in size with greater than 50% respiratory variability, suggesting right atrial pressure of 3 mmHg. FINDINGS  Left Ventricle: Left ventricular ejection fraction, by estimation, is 55 to 60%. The left ventricle has normal function. The left ventricle has no regional wall motion abnormalities. The left ventricular internal cavity size was normal in size. There is  no left ventricular hypertrophy. Left ventricular diastolic parameters are  consistent with Grade I diastolic dysfunction (impaired relaxation). Right Ventricle: The right ventricular size is normal. No increase in right ventricular wall thickness. Right ventricular systolic function is normal. There is normal pulmonary artery systolic pressure. The tricuspid regurgitant velocity is 2.60 m/s, and  with an assumed right atrial pressure of 3 mmHg, the estimated right ventricular systolic pressure is 30.0 mmHg. Left Atrium: Left atrial size was normal in size. Right Atrium: Right atrial size was normal in size. Pericardium: There is no evidence of pericardial effusion. Mitral Valve: The mitral valve is abnormal. There is mild thickening of the mitral valve leaflet(s). There is mild calcification of the mitral valve leaflet(s). Moderate mitral annular  calcification. Mild mitral valve regurgitation. No evidence of mitral  valve stenosis. MV peak gradient, 2.5 mmHg. The mean mitral valve gradient is 1.0 mmHg. Tricuspid Valve: The tricuspid valve is normal in structure. Tricuspid valve regurgitation is mild . No evidence of tricuspid stenosis. Aortic Valve: The aortic valve is tricuspid. There is moderate calcification of the aortic valve. There is moderate thickening of the aortic valve. Aortic valve regurgitation is moderate. Aortic regurgitation PHT measures 357 msec. Mild aortic stenosis is present. Aortic valve mean gradient measures 6.0 mmHg. Aortic valve peak gradient measures 11.4 mmHg. Aortic valve area, by VTI measures 2.06 cm. Pulmonic Valve: The pulmonic valve was normal in structure. Pulmonic valve regurgitation is not visualized. No evidence of pulmonic stenosis. Aorta: The aortic root is normal in size and structure. Venous: The inferior vena cava is normal in size with greater than 50% respiratory variability, suggesting right atrial pressure of 3 mmHg. IAS/Shunts: No atrial level shunt detected by color flow Doppler.  LEFT VENTRICLE PLAX 2D LVIDd:         4.20 cm      Diastology LVIDs:         2.80 cm      LV e' medial:    9.57 cm/s LV PW:         0.70 cm      LV E/e' medial:  5.5 LV IVS:        0.70 cm      LV e' lateral:   9.14 cm/s LVOT diam:     2.00 cm      LV E/e' lateral: 5.7 LV SV:         57 LV SV Index:   39 LVOT Area:     3.14 cm  LV Volumes (MOD) LV vol d, MOD A2C: 107.0 ml LV vol d, MOD A4C: 84.4 ml LV vol s, MOD A2C: 30.5 ml LV vol s, MOD A4C: 24.8 ml LV SV MOD A2C:     76.5 ml LV SV MOD A4C:     84.4 ml LV SV MOD BP:      68.1 ml RIGHT VENTRICLE             IVC RV Basal diam:  3.70 cm     IVC diam: 1.30 cm RV Mid diam:    2.50 cm RV S prime:     15.50 cm/s TAPSE (M-mode): 2.3 cm LEFT ATRIUM             Index        RIGHT ATRIUM          Index LA diam:        3.20 cm 2.19 cm/m   RA Area:     9.05 cm LA Vol (A2C):   23.2 ml 15.84  ml/m  RA Volume:   17.50 ml 11.95 ml/m LA Vol (A4C):   24.2 ml 16.53 ml/m LA Biplane Vol: 26.1 ml 17.82 ml/m  AORTIC VALVE                     PULMONIC VALVE AV Area (Vmax):    1.83 cm      PV Vmax:       0.70 m/s AV Area (Vmean):   1.81 cm      PV Peak grad:  2.0 mmHg AV Area (VTI):     2.06 cm AV Vmax:           169.00 cm/s AV Vmean:          110.000 cm/s AV VTI:            0.279 m AV Peak Grad:      11.4 mmHg AV Mean Grad:      6.0 mmHg LVOT Vmax:         98.20 cm/s LVOT Vmean:        63.400 cm/s LVOT VTI:          0.183 m LVOT/AV VTI ratio: 0.66 AI PHT:            357 msec  AORTA Ao Root diam: 2.80 cm Ao Asc diam:  2.40 cm MITRAL VALVE               TRICUSPID VALVE MV Area (PHT): 3.70 cm    TR Peak grad:   27.0 mmHg MV Area VTI:   3.32 cm    TR Vmax:        260.00 cm/s MV Peak grad:  2.5 mmHg MV Mean grad:  1.0 mmHg    SHUNTS MV Vmax:       0.80 m/s    Systemic VTI:  0.18 m MV Vmean:      53.8 cm/s   Systemic Diam: 2.00 cm MV Decel Time: 205 msec MV E velocity: 52.40 cm/s MV A velocity: 83.80 cm/s MV E/A ratio:  0.63 Charlton Haws MD Electronically signed by Charlton Haws MD Signature Date/Time: 01/02/2024/2:52:03 PM    Final     Assessment & Plan:   Problem List Items Addressed This Visit     Hypothyroidism   Check TSH      Essential hypertension, benign   Low salt intake Take Norvasc 2.5 mg daily  SBP 140-150 at home      Acute pain of right shoulder - Primary   Relevant Orders   DG Shoulder Right   Statin intolerance   Off statins      Wheezing   URI Rx - Zpack Medrol pack         Meds ordered this encounter  Medications   azithromycin (ZITHROMAX Z-PAK) 250 MG tablet    Sig: As directed    Dispense:  6 tablet    Refill:  0   methylPREDNISolone acetate (DEPO-MEDROL) injection 80 mg      Follow-up: Return in about 3 months (around 04/22/2024) for a follow-up visit.  Sonda Primes, MD

## 2024-01-24 NOTE — Assessment & Plan Note (Signed)
 Low salt intake Take Norvasc 2.5 mg daily  SBP 140-150 at home

## 2024-01-24 NOTE — Assessment & Plan Note (Signed)
Off statins 

## 2024-01-24 NOTE — Assessment & Plan Note (Signed)
 URI Rx - Zpack Medrol pack

## 2024-01-24 NOTE — Assessment & Plan Note (Signed)
 Check TSH

## 2024-01-25 ENCOUNTER — Encounter: Payer: Self-pay | Admitting: Internal Medicine

## 2024-01-25 ENCOUNTER — Telehealth: Payer: Self-pay | Admitting: Internal Medicine

## 2024-01-25 NOTE — Telephone Encounter (Signed)
 Copied from CRM [id number]. Topic: Clinical - Medication Question >> Jan 25, 2024 11:43 AM Hector Shade B wrote: Reason for CRM: Received a call from the patient with some concerns about reaction to the medication which was given by provider on yesterday. Patient stated she received the zpack and injection, last night her family as well as the nurse stated something was wrong she didn't seem normal, patient stated the only thing that she as gotten was the medication at apt. Patient requested the name of the medication which I provided so she could give it to the nurse. However, the nurse, Herbert Seta is requesting a callback.  She is the patient's nurse at [facility]. The nuber is [phone]

## 2024-01-25 NOTE — Telephone Encounter (Signed)
 Copied from CRM [id number]. Topic: Clinical - Medical Advice >> Jan 25, 2024 11:13 AM Josefa Half C wrote: Reason for CRM: Heather(Clinical nurse at [facility]) called to inform the provider about the behavior the patient displayed after receiving her injection on 01/24/2024. On the night of 01/24/2024 the patient was talking to people that were not there and not conscious of where she was. The nurse would like to know if those behaviors could have been caused by the injection. Please follow up with the nurse, Herbert Seta, before she leaves at 5:30p, if possible. [phone]

## 2024-01-27 ENCOUNTER — Other Ambulatory Visit: Payer: Self-pay | Admitting: Internal Medicine

## 2024-01-27 NOTE — Telephone Encounter (Signed)
 Last Fill: 08/24/23 150 tabs/2 RF  Last OV: 01/24/24 Next OV: 04/09/24  Routing to provider for review/authorization.

## 2024-01-27 NOTE — Telephone Encounter (Signed)
 Copied from CRM [id number]. Topic: Clinical - Medication Refill >> Jan 27, 2024 10:36 AM Pascal Lux wrote: Most Recent Primary Care Visit:  Provider: [name]  Department: [facility]  Visit Type: OFFICE VISIT  Date: 01/09/2024  Medication: LORazepam (ATIVAN) 1 MG tablet [[id number]]  Has the patient contacted their pharmacy? Yes (Agent: If no, request that the patient contact the pharmacy for the refill. If patient does not wish to contact the pharmacy document the reason why and proceed with request.) (Agent: If yes, when and what did the pharmacy advise?) Provider said no refills  Is this the correct pharmacy for this prescription? Yes If no, delete pharmacy and type the correct one.  This is the patient's preferred pharmacy:  [facility] [place], [place] - [address] [place] [address] Phone: [phone] Fax: [phone]   Has the prescription been filled recently? No  Is the patient out of the medication? Yes  Has the patient been seen for an appointment in the last year OR does the patient have an upcoming appointment? Yes  Can we respond through MyChart? Yes  Agent: Please be advised that Rx refills may take up to 3 business days. We ask that you follow-up with your pharmacy.

## 2024-01-28 MED ORDER — LORAZEPAM 1 MG PO TABS: ORAL_TABLET | ORAL | 2 refills | Status: DC

## 2024-01-30 NOTE — Telephone Encounter (Signed)
 Lvm for the [facility] nurse in regards to the message left for PCP and providers response is as follows "I am sorry.  Zithromax should not cause a problem with confusion. Steroid injection that I really received may have contributed, however, she has received steroid injection in the past with no problem. She should stop taking tramadol if she has been taking tramadol. Thank you"

## 2024-01-30 NOTE — Telephone Encounter (Signed)
 I am sorry.  Zithromax should not cause a problem with confusion. Steroid injection that I really received may have contributed, however, she has received steroid injection in the past with no problem. She should stop taking tramadol if she has been taking tramadol. Thank you

## 2024-01-31 ENCOUNTER — Other Ambulatory Visit: Payer: Self-pay | Admitting: Internal Medicine

## 2024-02-01 ENCOUNTER — Ambulatory Visit: Payer: Medicare Other

## 2024-02-01 DIAGNOSIS — M810 Age-related osteoporosis without current pathological fracture: Secondary | ICD-10-CM

## 2024-02-01 NOTE — Progress Notes (Deleted)
 Office Visit Note  Patient: [name]             Date of Birth: 05/09/1936           MRN: [id number]             PCP: [name], MD Referring: [name], MD Visit Date: 02/14/2024 Occupation: @GUAROCC @  Subjective:  No chief complaint on file.   History of Present Illness: [name] is a 88 y.o. female ***     Activities of Daily Living:  Patient reports morning stiffness for *** {minute/hour:19697}.   Patient {ACTIONS;DENIES/REPORTS:21021675::"Denies"} nocturnal pain.  Difficulty dressing/grooming: {ACTIONS;DENIES/REPORTS:21021675::"Denies"} Difficulty climbing stairs: {ACTIONS;DENIES/REPORTS:21021675::"Denies"} Difficulty getting out of chair: {ACTIONS;DENIES/REPORTS:21021675::"Denies"} Difficulty using hands for taps, buttons, cutlery, and/or writing: {ACTIONS;DENIES/REPORTS:21021675::"Denies"}  No Rheumatology ROS completed.   PMFS History:  Patient Active Problem List   Diagnosis Date Noted   Allergic rhinitis 01/24/2024   Wheezing 01/24/2024   Statin intolerance 01/09/2024   Chronic low back pain 11/28/2023   Acute pain of right shoulder 11/28/2023   Dyslipidemia 11/28/2023   Mild cognitive impairment with memory loss 09/15/2023   CHF (congestive heart failure)    Hypotension 07/13/2023   Skin ulcer of nose 07/13/2023   Chalazion left upper eyelid 04/18/2023   Pain of right sacroiliac joint 01/14/2023   Lumbar degenerative disc disease 12/21/2022   Chronic left SI joint pain 11/30/2022   Inflammation of sacroiliac joint 11/16/2022   Stool incontinence 11/16/2022   Grief 10/06/2022   History of basal cell carcinoma 09/29/2022   Stress at home 07/21/2022   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   Gait disorder 02/28/2022   Melanocytic nevi of trunk 02/22/2022   Seborrheic dermatitis 02/22/2022   Urticaria 02/22/2022   Concussion 10/29/2021   Allergic rhinitis due to animal (cat) (dog) hair and dander 08/07/2021   Allergic  rhinitis due to pollen 08/07/2021   Mild intermittent asthma 08/07/2021   Cervical spine instability 05/15/2021   Cervical spinal cord compression 05/04/2021   Degenerative disc disease, cervical 03/11/2021   Spondylolisthesis, cervical region 03/11/2021   Trochanteric bursitis of right hip 09/22/2020   Meningioma 03/25/2020   Cholelithiasis 09/09/2019   Cervical spondylosis 01/05/2019   Neck pain 12/26/2018   Generalized anxiety disorder 08/22/2018   Contact dermatitis and eczema 06/14/2018   Constipation 11/11/2016   Sinusitis, chronic 08/06/2016   Rash 04/20/2016   Aortic valve sclerosis 03/12/2015   Fatigue 09/08/2014   Increased frequency of urination 08/19/2014   Urinary urgency 08/19/2014   Postherpetic neuralgia 10/02/2013   Cerumen impaction 07/16/2013   Insomnia 01/24/2013   Pruritus of skin 03/11/2012   MVP (mitral valve prolapse)    Multiple thyroid nodules 10/04/2011   Herpes zoster 09/07/2011   Essential hypertension, benign 07/27/2011   Atrophic vaginitis    Hyperlipemia, mixed 03/15/2011   OAB (overactive bladder) 09/02/2010   Hypothyroidism 03/03/2010   Irritable bowel syndrome 09/04/2008   History of colonic polyps 09/04/2008   Atrophic gastritis 09/03/2008   Duodenitis 09/03/2008   Tibialis tendinitis 03/20/2008   Osteoarthritis 02/25/2008   Lactose intolerance 11/20/2007   GERD (gastroesophageal reflux disease) 11/20/2007   Osteoporosis 11/20/2007    Past Medical History:  Diagnosis Date   Allergic rhinitis due to animal (cat) (dog) hair and dander 08/07/2021   Allergic rhinitis due to pollen 08/07/2021   Aortic valve sclerosis 03/12/2015   Atrophic gastritis 09/03/2008   Atrophic vaginitis    Baker's cyst    Left-Dr. Lequita Halt   Bladder pain  08/19/2014   Chronic  Take Vesicare 10 mg/d  UA was ok  On Premarin cream PV per GYN  On Vesicare  F/u w/Dr Monico Blitz     Cataract    Dr. Dione Booze   Cervical spinal cord compression 05/04/2021   Cervical spine  instability 05/15/2021   Dr. Venetia Maxon: plan -  posterior C1-2 decompression and instrumented fusion.  MRI: Progressive soft tissue pannus at C1-2 is now creating mass  effect on the craniocervical junction with distortion of the upper  spinal cord. This is likely related rheumatoid arthritis  C spine CT: Fracture of the anterior arch of C1 with 12 mm separation. Nondisplaced fracture posterior arch of C1. There is later   Cervical spondylosis 01/05/2019   Chalazion left upper eyelid 04/18/2023   X2  Keflex po - too big... Will use Ceftin  F/u w/Ophthalmology   CHF (congestive heart failure)    Cholelithiasis 09/09/2019   Per Dr Marina Goodell: "At this point we mutually decided to watch her abdominal complaints.  If symptoms accelerate or become more classic for symptomatic cholelithiasis, then she is agreeable to surgical referral.  She will keep me posted"   Chronic left SI joint pain 11/30/2022   Closed fracture of first cervical vertebra 08/07/2021   Closed fracture of second cervical vertebra 08/07/2021   Concussion 10/29/2021   s/p a hard fall after she rushed and slipped on the wet marble floor while visiting family in [place] a couple week ago.  She hit the side of her skull, had a skin laceration bled quite a bit.  She was dazed and according to her son Lorin Picket had a loss of consciousness of under a minute duration.  She felt dazed.  She was taken to ER.  2 staples were applied to her skin laceration on the left o   Constipation 11/11/2016   Chronic, multifactorial  Miralax prn  7/22 Postop constipation related to oxycodone and Robaxin.  We can discontinue oxycodone Robaxin.  The patient will use tramadol and Tylenol instead.  Use MiraLAX or Senokot as to produce soft regular stools.  Discontinue Pepto-Bismol.  I think this should help with abdominal bloating complaint.      Take Metamucil daily  Senokot as to produce soft regular sto   Contact dermatitis and eczema 06/14/2018   Eczema vs other - 7/19   Cortaid prn  Depo-medrol IM 80 mg  Remove nail polish     Contusion of left chest wall 01/09/2015   Airabella fell and broke a rib #8 on the L last week, she had a rib X ray and a CT (Dr Thurston Hole).   Contusion of right knee 08/12/2016   R knee   Degenerative disc disease, cervical 03/11/2021   Duodenitis 09/03/2008   Essential hypertension, benign 07/27/2011   Chronic, mild  NAS diet  Cardiac CT scan for calcium scoring offered 1/20  1/24 HTN and s/p ER visit on 12/08/22 for SBP 180. Per Dr Lynelle Doctor: " Discussed with the patient that her restarting donepezil could have impacted her symptoms today.  However, her blood pressure continues to be mildly elevated.  Will place her on 2.5 mg amlodipine to take if her blood pressure is over 170/100.  Nursing aide at   Fatigue 09/08/2014   10/15, 2/16, 9/17, 2/18  No other sx  Anysa is managing it with short rest periods 1-2/d     Fibroid    Gait disorder 02/28/2022   Worse  Multifactorial  Cont w/PT  In  the balance class  Balance class q 1 week     [name] resolved after doing exercises by Dr Darrick Penna 11/2021     Generalized anxiety disorder 08/22/2018   Chronic   Lexapro - not taking  Weighted blanket  Lorazepam prn, - tolerance has developed   Potential benefits of a long term benzodiazepines  use as well as potential risks  and complications were explained to the patient and were aknowledged.     GERD (gastroesophageal reflux disease) 11/20/2007   Chronic. Better on Gluten free diet  Dexilant   Potential benefits of a long term PPI  use as well as potential risks  and complications were explained to the patient and were aknowledged.  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     History of basal cell carcinoma 09/29/2022   History of colonic polyps 09/04/2008   Hyperlipemia, mixed  03/15/2011   Chronic  On Simvastatin     Hypotension 07/13/2023   Stop Amlodipine     Hypothyroidism    Impacted cerumen of right ear 07/16/2013   Increased frequency of urination 08/19/2014   Inflammation of sacroiliac joint 11/16/2022   Insomnia 01/24/2013   Lorazepam prn   Potential benefits of a long term benzodiazepines  use as well as potential risks  and complications were explained to the patient and were aknowledged.        Irritable bowel syndrome 09/04/2008   Chronic   Marina Goodell MD, Wilhemina Bonito  2013 - much better on gluten free diet              Lactose intolerance 11/20/2007   Lactaid        Lumbar degenerative disc disease 12/21/2022   Based on MRI and her exam there is significant DDD in lumbar spine     Lumbar spondylosis    Melanocytic nevi of trunk 02/22/2022   Meningioma 03/25/2020   Dr Venetia Maxon     Mild cognitive impairment with memory loss 09/15/2023   Mild intermittent asthma 08/07/2021   Multiple thyroid nodules 10/04/2011   2013 she was started on Synthroid - tol well  2017, 2022 [place]     MVP (mitral valve prolapse)    Antibiotics required for dental procedures   Neck pain 12/26/2018   Chronic  PT, traction, TENs unit was offered  Tramadol prn   Potential benefits of a long term opioids use as well as potential risks (i.e. addiction risk, apnea etc) and complications (i.e. Somnolence, constipation and others) were explained to the patient and were aknowledged.        OAB (overactive bladder) 09/02/2010   Dr Earlene Plater  Use Solifenacin prn  Worse - ?multifactorial  UA was normal  Huntley Dec increased Memantine to 10 mg bid a month ago     Osteoarthritis 02/25/2008   Dr Ethelene Hal  Blue-Emu cream was recommended to use 2-3 times a day        Osteoporosis 11/20/2007   Chronic  Per GYN        Overactive bladder    Pain of right sacroiliac joint 01/14/2023   Postherpetic neuralgia 10/02/2013   Pruritus of skin 03/11/2012   S/p H zoster vaccination  Relapsing  8/12 -  on face  8/14 - R  ear/face  10/14 ?     Sacroiliitis    Seborrheic dermatitis 02/22/2022   Sinusitis, chronic 08/06/2016   Worse  Flonase, Atrovent nasal, Singulair, Claritin  ENT ref Dr Dorma Russell  CT sinuses     Skin ulcer of nose 07/13/2023   Soft pads on the nose  Derm appt     Spondylolisthesis, cervical region 03/11/2021   Stool incontinence 11/16/2022   12/23  Worse.  Stool leakage - new  D/c Aricept  Abd X ray to rule out constipation     Tibialis tendinitis 03/20/2008   Torn meniscus    bilateral   Trochanteric bursitis of right hip 09/22/2020   Upper respiratory infection 08/03/2012   9/13 - 3 wks  2/17  11/18 - asthmatic bronchitis     Urinary urgency 08/19/2014   2023 worse.  Obtain urinalysis.  D/c Aricept  UA was ok  On Premarin cream PV per GYN  On Vesicare  F/u w/Dr Monico Blitz     Urticaria 02/22/2022    Family History  Problem Relation Age of Onset   Heart disease Mother    Hypertension Mother    Osteoporosis Mother    Congestive Heart Failure Mother    Pancreatic cancer Father    Heart attack Brother    Drug abuse Brother        over dose    Healthy Son    Healthy Son    Colon cancer Neg Hx    Colon polyps Neg Hx    Rectal cancer Neg Hx    Stomach cancer Neg Hx    Past Surgical History:  Procedure Laterality Date   CATARACT EXTRACTION, BILATERAL     EYE SURGERY     POSTERIOR CERVICAL FUSION/FORAMINOTOMY N/A 05/15/2021   Procedure: Cervical One Laminectomy with resection of cyst, Fixation from Occiput to Cervical Four;  Surgeon: [name], MD;  Location: [facility];  Service: Neurosurgery;  Laterality: N/A;   TONSILLECTOMY     Social History   Social History Narrative   Regular exercise-yesDaily caffeine use   Right handed   Lives alone   retired   Immunization History  Administered Date(s) Administered   Fluad Quad(high Dose 65+) 08/04/2019, 09/16/2021, 09/12/2022   Fluad Trivalent(High Dose 65+) 09/01/2023   Influenza Whole 09/29/2007, 08/21/2008, 09/02/2010,  07/30/2012   Influenza, High Dose Seasonal PF 09/03/2013, 02/19/2015, 11/12/2015, 08/06/2016, 11/11/2016, 08/20/2017, 08/31/2017, 09/20/2018, 09/12/2019   Influenza,inj,Quad PF,6+ Mos 12/17/2014, 07/30/2015   PFIZER(Purple Top)SARS-COV-2 Vaccination 12/12/2019, 12/31/2019, 07/29/2020, 07/29/2020, 07/31/2020, 09/10/2020   Pfizer Covid-19 Vaccine Bivalent Booster 40yrs & up 08/21/2021   Pfizer(Comirnaty)Fall Seasonal Vaccine 12 years and older 09/29/2022   Pneumococcal Conjugate-13 01/09/2014   Pneumococcal Polysaccharide-23 09/26/2006, 08/12/2015, 11/11/2016, 08/31/2017, 09/10/2020, 09/10/2021   Td 04/14/2010   Tdap 06/18/2020   Typhoid Inactivated 02/23/2013   Zoster, Live 12/09/2006     Objective: Vital Signs: There were no vitals taken for this visit.   Physical Exam   Musculoskeletal Exam: ***  CDAI Exam: CDAI Score: -- Patient Global: --; Provider Global: -- Swollen: --; Tender: -- Joint Exam 02/14/2024   No joint exam has been documented for this visit   There is currently no information documented on the homunculus. Go to the Rheumatology activity and complete the homunculus joint exam.  Investigation: No additional findings.  Imaging: ECHOCARDIOGRAM COMPLETE Result Date: 01/02/2024    ECHOCARDIOGRAM REPORT   Patient Name:   [name] Date of Exam: 01/02/2024 Medical Rec #:  [id number]     Height:       60.0 in Accession #:    [id number]    Weight:       113.0 lb Date of Birth:  Oct 04, 1936      BSA:          1.464 m Patient Age:    87 years      BP:           120/70 mmHg Patient Gender: F             HR:           83 bpm. Exam Location:  Outpatient Procedure: 2D Echo, Cardiac Doppler and Color Doppler Indications:    CHF  History:        Patient has prior history of Echocardiogram examinations, most                 recent 02/11/2023. CHF, Signs/Symptoms:Hypotension and Fatigue;                 Risk Factors:Dyslipidemia.  Sonographer:    Vern Claude Referring Phys: [id number] DANIEL  R BENSIMHON IMPRESSIONS  1. Left ventricular ejection fraction, by estimation, is 55 to 60%. The left ventricle has normal function. The left ventricle has no regional wall motion abnormalities. Left ventricular diastolic parameters are consistent with Grade I diastolic dysfunction (impaired relaxation).  2. Right ventricular systolic function is normal. The right ventricular size is normal. There is normal pulmonary artery systolic pressure.  3. The mitral valve is abnormal. Mild mitral valve regurgitation. No evidence of mitral stenosis. Moderate mitral annular calcification.  4. The aortic valve is tricuspid. There is moderate calcification of the aortic valve. There is moderate thickening of the aortic valve. Aortic valve regurgitation is moderate. Mild aortic valve stenosis.  5. The inferior vena cava is normal in size with greater than 50% respiratory variability, suggesting right atrial pressure of 3 mmHg. FINDINGS  Left Ventricle: Left ventricular ejection fraction, by estimation, is 55 to 60%. The left ventricle has normal function. The left ventricle has no regional wall motion abnormalities. The left ventricular internal cavity size was normal in size. There is  no left ventricular hypertrophy. Left ventricular diastolic parameters are consistent with Grade I diastolic dysfunction (impaired relaxation). Right Ventricle: The right ventricular size is normal. No increase in right ventricular wall thickness. Right ventricular systolic function is normal. There is normal pulmonary artery systolic pressure. The tricuspid regurgitant velocity is 2.60 m/s, and  with an assumed right atrial pressure of 3 mmHg, the estimated right ventricular systolic pressure is 30.0 mmHg. Left Atrium: Left atrial size was normal in size. Right Atrium: Right atrial size was normal in size. Pericardium: There is no evidence of pericardial effusion. Mitral Valve: The mitral valve is abnormal. There is mild thickening of the mitral  valve leaflet(s). There is mild calcification of the mitral valve leaflet(s). Moderate mitral annular calcification. Mild mitral valve regurgitation. No evidence of mitral  valve stenosis. MV peak gradient, 2.5 mmHg. The mean mitral valve gradient is 1.0 mmHg. Tricuspid Valve: The tricuspid valve is normal in structure. Tricuspid valve regurgitation is mild . No evidence of tricuspid stenosis. Aortic Valve: The aortic valve is tricuspid. There is moderate calcification of the aortic valve. There is moderate thickening of the aortic valve. Aortic valve regurgitation is moderate. Aortic regurgitation PHT measures 357 msec. Mild aortic stenosis is present. Aortic valve mean gradient measures 6.0 mmHg. Aortic valve peak gradient measures 11.4 mmHg. Aortic  valve area, by VTI measures 2.06 cm. Pulmonic Valve: The pulmonic valve was normal in structure. Pulmonic valve regurgitation is not visualized. No evidence of pulmonic stenosis. Aorta: The aortic root is normal in size and structure. Venous: The inferior vena cava is normal in size with greater than 50% respiratory variability, suggesting right atrial pressure of 3 mmHg. IAS/Shunts: No atrial level shunt detected by color flow Doppler.  LEFT VENTRICLE PLAX 2D LVIDd:         4.20 cm      Diastology LVIDs:         2.80 cm      LV e' medial:    9.57 cm/s LV PW:         0.70 cm      LV E/e' medial:  5.5 LV IVS:        0.70 cm      LV e' lateral:   9.14 cm/s LVOT diam:     2.00 cm      LV E/e' lateral: 5.7 LV SV:         57 LV SV Index:   39 LVOT Area:     3.14 cm  LV Volumes (MOD) LV vol d, MOD A2C: 107.0 ml LV vol d, MOD A4C: 84.4 ml LV vol s, MOD A2C: 30.5 ml LV vol s, MOD A4C: 24.8 ml LV SV MOD A2C:     76.5 ml LV SV MOD A4C:     84.4 ml LV SV MOD BP:      68.1 ml RIGHT VENTRICLE             IVC RV Basal diam:  3.70 cm     IVC diam: 1.30 cm RV Mid diam:    2.50 cm RV S prime:     15.50 cm/s TAPSE (M-mode): 2.3 cm LEFT ATRIUM             Index        RIGHT ATRIUM           Index LA diam:        3.20 cm 2.19 cm/m   RA Area:     9.05 cm LA Vol (A2C):   23.2 ml 15.84 ml/m  RA Volume:   17.50 ml 11.95 ml/m LA Vol (A4C):   24.2 ml 16.53 ml/m LA Biplane Vol: 26.1 ml 17.82 ml/m  AORTIC VALVE                     PULMONIC VALVE AV Area (Vmax):    1.83 cm      PV Vmax:       0.70 m/s AV Area (Vmean):   1.81 cm      PV Peak grad:  2.0 mmHg AV Area (VTI):     2.06 cm AV Vmax:           169.00 cm/s AV Vmean:          110.000 cm/s AV VTI:            0.279 m AV Peak Grad:      11.4 mmHg AV Mean Grad:      6.0 mmHg LVOT Vmax:         98.20 cm/s LVOT Vmean:        63.400 cm/s LVOT VTI:          0.183 m LVOT/AV VTI ratio: 0.66 AI PHT:            357 msec  AORTA Ao Root diam: 2.80 cm Ao Asc diam:  2.40 cm MITRAL VALVE               TRICUSPID VALVE MV Area (PHT): 3.70 cm    TR Peak grad:   27.0 mmHg MV Area VTI:   3.32 cm    TR Vmax:        260.00 cm/s MV Peak grad:  2.5 mmHg MV Mean grad:  1.0 mmHg    SHUNTS MV Vmax:       0.80 m/s    Systemic VTI:  0.18 m MV Vmean:      53.8 cm/s   Systemic Diam: 2.00 cm MV Decel Time: 205 msec MV E velocity: 52.40 cm/s MV A velocity: 83.80 cm/s MV E/A ratio:  0.63 Charlton Haws MD Electronically signed by Charlton Haws MD Signature Date/Time: 01/02/2024/2:52:03 PM    Final     Recent Labs: Lab Results  Component Value Date   WBC 7.5 10/05/2023   HGB 12.8 10/05/2023   PLT 267.0 10/05/2023   NA 134 (L) 12/13/2023   K 3.7 12/13/2023   CL 98 12/13/2023   CO2 26 12/13/2023   GLUCOSE 89 12/13/2023   BUN 14 12/13/2023   CREATININE 0.76 12/13/2023   BILITOT 0.5 12/13/2023   ALKPHOS 29 (L) 12/13/2023   AST 17 12/13/2023   ALT 17 12/13/2023   PROT 7.1 12/13/2023   ALBUMIN 4.2 12/13/2023   CALCIUM 9.3 12/13/2023   GFRAA 87 07/17/2020    Speciality Comments: No specialty comments available.  Procedures:  No procedures performed Allergies: Bee venom, Bacitracin-polymyxin b, Clarithromycin, Diclofenac, Doxycycline, Neosporin  [neomycin-bacitracin zn-polymyx], and Hydrocortisone   Assessment / Plan:     Visit Diagnoses: No diagnosis found.  Orders: No orders of the defined types were placed in this encounter.  No orders of the defined types were placed in this encounter.   Face-to-face time spent with patient was *** minutes. Greater than 50% of time was spent in counseling and coordination of care.  Follow-Up Instructions: No follow-ups on file.   Ellen Henri, CMA  Note - This record has been created using Animal nutritionist.  Chart creation errors have been sought, but may not always  have been located. Such creation errors do not reflect on  the standard of medical care.

## 2024-02-09 ENCOUNTER — Ambulatory Visit (INDEPENDENT_AMBULATORY_CARE_PROVIDER_SITE_OTHER): Payer: Medicare Other | Admitting: Sports Medicine

## 2024-02-09 VITALS — BP 137/68 | Ht 60.0 in | Wt 113.0 lb

## 2024-02-09 DIAGNOSIS — M5030 Other cervical disc degeneration, unspecified cervical region: Secondary | ICD-10-CM

## 2024-02-09 NOTE — Progress Notes (Signed)
 CC: trapezius and neck pain  PT with fused cervical spine Problems with persistent trapezius spasm RT Improved with 3 ESWT so that she can now lift rt arm almost as well as left Now good pain control with tylenol and topical arthritis cream Did not have to use any tramadol  PE Pleasant elderly F in NAD BP 137/68   Ht 5' (1.524 m)   Wt 113 lb (51.3 kg)   BMI 22.07 kg/m   Neck : motion very limited in rotation, bending or extension; moderately limited in flexion  Good strength on neuro testing C5 to T1 Grip strength good  Much less spasm over RT trapezius  Shoulders with good IR and ER Flexion to 130 RT and 140 LT Abduction / elevation - 120 RT and 130 LT

## 2024-02-09 NOTE — Assessment & Plan Note (Signed)
 Pain and radicular sxs improved  Much better function of RT arm  Trapezius spasm is less  Continue conservative care  Reck prn or monthly as needed

## 2024-02-09 NOTE — Patient Instructions (Signed)
 Try therawrap or salon pas or a similar heat wrap to see if it helps your right trapezius/ shoulder  Keep up tylenol for pain  Keep up topical arthritis cream  CBD is OK  You have tramadol if needed for some severe pain  Good idea to start with some light weights  You might benefit from a gentle Pilates class  Shepard General PT at [facility] can advise you after she does an assessment

## 2024-02-13 ENCOUNTER — Telehealth: Payer: Self-pay | Admitting: Internal Medicine

## 2024-02-13 ENCOUNTER — Ambulatory Visit: Payer: Medicare Other | Admitting: Physician Assistant

## 2024-02-13 NOTE — Telephone Encounter (Unsigned)
 Copied from CRM [id number]. Topic: General - Other >> Feb 13, 2024 11:04 AM Truddie Crumble wrote: Reason for CRM: patient called stating when she took her pills last night and she thew up. Patient did not know if she need to take her pills this morning

## 2024-02-14 ENCOUNTER — Ambulatory Visit: Payer: Medicare Other | Admitting: Rheumatology

## 2024-02-14 DIAGNOSIS — Z8639 Personal history of other endocrine, nutritional and metabolic disease: Secondary | ICD-10-CM

## 2024-02-14 DIAGNOSIS — G5703 Lesion of sciatic nerve, bilateral lower limbs: Secondary | ICD-10-CM

## 2024-02-14 DIAGNOSIS — M62830 Muscle spasm of back: Secondary | ICD-10-CM

## 2024-02-14 DIAGNOSIS — M19042 Primary osteoarthritis, left hand: Secondary | ICD-10-CM

## 2024-02-14 DIAGNOSIS — G8929 Other chronic pain: Secondary | ICD-10-CM

## 2024-02-14 DIAGNOSIS — Z8719 Personal history of other diseases of the digestive system: Secondary | ICD-10-CM

## 2024-02-14 DIAGNOSIS — M5030 Other cervical disc degeneration, unspecified cervical region: Secondary | ICD-10-CM

## 2024-02-14 DIAGNOSIS — E034 Atrophy of thyroid (acquired): Secondary | ICD-10-CM

## 2024-02-14 DIAGNOSIS — M810 Age-related osteoporosis without current pathological fracture: Secondary | ICD-10-CM

## 2024-02-14 DIAGNOSIS — F4321 Adjustment disorder with depressed mood: Secondary | ICD-10-CM

## 2024-02-14 DIAGNOSIS — M51369 Other intervertebral disc degeneration, lumbar region without mention of lumbar back pain or lower extremity pain: Secondary | ICD-10-CM

## 2024-02-14 DIAGNOSIS — R2681 Unsteadiness on feet: Secondary | ICD-10-CM

## 2024-02-14 DIAGNOSIS — M19071 Primary osteoarthritis, right ankle and foot: Secondary | ICD-10-CM

## 2024-02-14 DIAGNOSIS — I10 Essential (primary) hypertension: Secondary | ICD-10-CM

## 2024-02-14 NOTE — Telephone Encounter (Signed)
 Are there any new medications in the diet and morning meds?

## 2024-02-15 ENCOUNTER — Ambulatory Visit: Payer: Self-pay | Admitting: Internal Medicine

## 2024-02-15 ENCOUNTER — Other Ambulatory Visit (INDEPENDENT_AMBULATORY_CARE_PROVIDER_SITE_OTHER)

## 2024-02-15 DIAGNOSIS — R350 Frequency of micturition: Secondary | ICD-10-CM

## 2024-02-15 DIAGNOSIS — E785 Hyperlipidemia, unspecified: Secondary | ICD-10-CM | POA: Diagnosis not present

## 2024-02-15 DIAGNOSIS — E034 Atrophy of thyroid (acquired): Secondary | ICD-10-CM | POA: Diagnosis not present

## 2024-02-15 LAB — LIPID PANEL
Cholesterol: 274 mg/dL — ABNORMAL HIGH (ref 0–200)
HDL: 72 mg/dL (ref >39.00)
LDL Cholesterol: 166 mg/dL — ABNORMAL HIGH (ref 0–99)
NonHDL: 202.47
Total CHOL/HDL Ratio: 4
Triglycerides: 181 mg/dL — ABNORMAL HIGH (ref 0.0–149.0)
VLDL: 36.2 mg/dL (ref 0.0–40.0)

## 2024-02-15 LAB — URINALYSIS
Bilirubin Urine: NEGATIVE
Hgb urine dipstick: NEGATIVE
Ketones, ur: NEGATIVE
Leukocytes,Ua: NEGATIVE
Nitrite: NEGATIVE
Specific Gravity, Urine: 1.01 (ref 1.000–1.030)
Total Protein, Urine: NEGATIVE
Urine Glucose: NEGATIVE
Urobilinogen, UA: 0.2 (ref 0.0–1.0)
pH: 6.5 (ref 5.0–8.0)

## 2024-02-15 LAB — COMPREHENSIVE METABOLIC PANEL
ALT: 45 U/L — ABNORMAL HIGH (ref 0–35)
AST: 23 U/L (ref 0–37)
Albumin: 4.3 g/dL (ref 3.5–5.2)
Alkaline Phosphatase: 136 U/L — ABNORMAL HIGH (ref 39–117)
BUN: 12 mg/dL (ref 6–23)
CO2: 26 meq/L (ref 19–32)
Calcium: 9.3 mg/dL (ref 8.4–10.5)
Chloride: 95 meq/L — ABNORMAL LOW (ref 96–112)
Creatinine, Ser: 0.52 mg/dL (ref 0.40–1.20)
GFR: 84.6 mL/min (ref >60.00)
Glucose, Bld: 109 mg/dL — ABNORMAL HIGH (ref 70–99)
Potassium: 3.9 meq/L (ref 3.5–5.1)
Sodium: 129 meq/L — ABNORMAL LOW (ref 135–145)
Total Bilirubin: 0.5 mg/dL (ref 0.2–1.2)
Total Protein: 7.6 g/dL (ref 6.0–8.3)

## 2024-02-15 LAB — CBC WITH DIFFERENTIAL/PLATELET
Basophils Absolute: 0 10*3/uL (ref 0.0–0.1)
Basophils Relative: 0.5 % (ref 0.0–3.0)
Eosinophils Absolute: 0.1 10*3/uL (ref 0.0–0.7)
Eosinophils Relative: 1 % (ref 0.0–5.0)
HCT: 39.2 % (ref 36.0–46.0)
Hemoglobin: 13.3 g/dL (ref 12.0–15.0)
Lymphocytes Relative: 15.3 % (ref 12.0–46.0)
Lymphs Abs: 1.4 10*3/uL (ref 0.7–4.0)
MCHC: 33.9 g/dL (ref 30.0–36.0)
MCV: 97.6 fl (ref 78.0–100.0)
Monocytes Absolute: 0.8 10*3/uL (ref 0.1–1.0)
Monocytes Relative: 9.4 % (ref 3.0–12.0)
Neutro Abs: 6.6 10*3/uL (ref 1.4–7.7)
Neutrophils Relative %: 73.8 % (ref 43.0–77.0)
Platelets: 282 10*3/uL (ref 150.0–400.0)
RBC: 4.02 Mil/uL (ref 3.87–5.11)
RDW: 12.8 % (ref 11.5–15.5)
WBC: 8.9 10*3/uL (ref 4.0–10.5)

## 2024-02-15 LAB — TSH: TSH: 1.96 u[IU]/mL (ref 0.35–5.50)

## 2024-02-15 MED ORDER — AZITHROMYCIN 250 MG PO TABS: ORAL_TABLET | ORAL | 0 refills | Status: DC

## 2024-02-15 NOTE — Telephone Encounter (Signed)
 Copied from CRM [id number]. Topic: Clinical - Red Word Triage >> Feb 15, 2024  9:40 AM Lennart Pall wrote: Red Word that prompted transfer to Nurse Triage: Patient was throwing up Sunday night after taking a medication. Has not been feeling well since then. She has chest congestion and her lower back is hurting too.     Chief Complaint: Cough  Additional Notes: Patient reports a cough that began 2-3 days ago. She states that with her cough she is also experiencing chest congestion and lower back pain. She states that her chest congestions is similar to previous instances of bronchitis. She states she also had some vomiting Sunday night that is now resolved.    Patient has an appointment scheduled for tomorrow but would like a prescription for an antibiotic sent to her pharmacy today if possible. She has requested a call back with a response to her request.

## 2024-02-15 NOTE — Telephone Encounter (Signed)
 OK Z pack - Rx emailed Keep [facility] tomorrow Thx

## 2024-02-15 NOTE — Addendum Note (Signed)
 Addended by: [name] on: 02/15/2024 01:30 PM   Modules accepted: Orders

## 2024-02-15 NOTE — Telephone Encounter (Signed)
 Copied from CRM [id number]. Topic: Appointments - Scheduling Inquiry for Clinic >> Feb 15, 2024  8:37 AM Isabell A wrote: Reason for CRM: Patient is requesting to speak to Dr.Plotnikov's nurse - in regard to her being seen today instead of tomorrow. Advised there was no availability for today.

## 2024-02-15 NOTE — Telephone Encounter (Signed)
 copied From CRM [id number]. Reason for Triage: Heather (clinic nurse) stated that the patient is experiencing lower back pain, a low grade fever of 99.1, and frequent urination. She is wondering if a urinalysis needs to be done. Please contact patient back.

## 2024-02-15 NOTE — Telephone Encounter (Addendum)
 Attempted to call pt no answer/ Lvm for pt to call the clinic. Provider is asking "Are there any new medications in the diet and morning meds?"

## 2024-02-15 NOTE — Telephone Encounter (Signed)
 Copied from CRM [id number]. Topic: Clinical - Red Word Triage >> Feb 15, 2024  9:40 AM Lennart Pall wrote: Red Word that prompted transfer to Nurse Triage: Patient was throwing up Sunday night after taking a medication. Has not been feeling well since then. She has chest congestion and her lower back is hurting too.   Chief Complaint: Cough Symptoms: Cough, chest congestion, lower back pain  Frequency: Frequent  Pertinent Negatives: Patient denies fever, vomiting  Disposition: [] ED /[] Urgent Care (no appt availability in office) / [x] Appointment(In office/virtual)/ []  [facility]/ [] Home Care/ [] Refused Recommended Disposition /[] [facility]/ []  Follow-up with PCP Additional Notes: Patient reports a cough that began 2-3 days ago. She states that with her cough she is also experiencing chest congestion and lower back pain. She states that her chest congestions is similar to previous instances of bronchitis. She states she also had some vomiting Sunday night that is now resolved.   Patient has an appointment scheduled for tomorrow but would like a prescription for an antibiotic sent to her pharmacy today if possible. She has requested a call back with a response to her request.     Reason for Disposition  [1] Continuous (nonstop) coughing interferes with work or school AND [2] no improvement using cough treatment per Care Advice  Answer Assessment - Initial Assessment Questions 1. ONSET: "When did the cough begin?"      2-3 days ago  2. SEVERITY: "How bad is the cough today?"      Moderate  3. SPUTUM: "Describe the color of your sputum" (none, dry cough; clear, white, yellow, green)     No 4. HEMOPTYSIS: "Are you coughing up any blood?" If so ask: "How much?" (flecks, streaks, tablespoons, etc.)     No 5. DIFFICULTY BREATHING: "Are you having difficulty breathing?" If Yes, ask: "How bad is it?" (e.g., mild, moderate, severe)    - MILD: No SOB at rest, mild SOB with  walking, speaks normally in sentences, can lie down, no retractions, pulse < 100.    - MODERATE: SOB at rest, SOB with minimal exertion and prefers to sit, cannot lie down flat, speaks in phrases, mild retractions, audible wheezing, pulse 100-120.    - SEVERE: Very SOB at rest, speaks in single words, struggling to breathe, sitting hunched forward, retractions, pulse > 120      No 6. FEVER: "Do you have a fever?" If Yes, ask: "What is your temperature, how was it measured, and when did it start?"     No 7. CARDIAC HISTORY: "Do you have any history of heart disease?" (e.g., heart attack, congestive heart failure)      CHF 8. LUNG HISTORY: "Do you have any history of lung disease?"  (e.g., pulmonary embolus, asthma, emphysema)     No 9. PE RISK FACTORS: "Do you have a history of blood clots?" (or: recent major surgery, recent prolonged travel, bedridden)     No 10. OTHER SYMPTOMS: "Do you have any other symptoms?" (e.g., runny nose, wheezing, chest pain)       Lower back pain  Protocols used: Cough - Acute Non-Productive-A-AH

## 2024-02-15 NOTE — Progress Notes (Signed)
 Office Visit Note  Patient: [name]             Date of Birth: 1936/03/23           MRN: [id number]             PCP: [name], MD Referring: [name], MD Visit Date: 02/29/2024 Occupation: @GUAROCC @  Subjective:  Neck pain   History of Present Illness: [name] is a 88 y.o. female with osteoarthritis, degenerative disc disease and osteoporosis.  She returns today after her last visit in December 2024.  She states in mid March she started having increased pain in her neck which was radiating to her right arm.  She was evaluated by Dr. Charlean Sanfilippo.  She was referred to physical therapy.  She has been using topical arthritis relief which has been helpful.  She states that gradually symptoms have been improving.  She does not have gluteal pain now.  She has intermittent discomfort at the region.  None of the other joints are painful.    Activities of Daily Living:  Patient reports morning stiffness for 15-20 minutes.   Patient Denies nocturnal pain.  Difficulty dressing/grooming: Denies Difficulty climbing stairs: Denies Difficulty getting out of chair: Denies Difficulty using hands for taps, buttons, cutlery, and/or writing: Denies  Review of Systems  Constitutional:  Negative for fatigue.  HENT:  Positive for mouth dryness. Negative for mouth sores.   Eyes:  Negative for dryness.  Respiratory:  Negative for shortness of breath.   Cardiovascular:  Negative for chest pain and palpitations.  Gastrointestinal:  Positive for constipation. Negative for blood in stool and diarrhea.  Endocrine: Positive for increased urination.  Genitourinary:  Positive for involuntary urination.  Musculoskeletal:  Positive for joint pain, gait problem, joint pain and morning stiffness. Negative for joint swelling, myalgias, muscle weakness, muscle tenderness and myalgias.  Skin:  Negative for color change, rash, hair loss and sensitivity to sunlight.  Allergic/Immunologic:  Negative for susceptible to infections.  Neurological:  Negative for dizziness and headaches.  Hematological:  Negative for swollen glands.  Psychiatric/Behavioral:  Positive for depressed mood. Negative for sleep disturbance. The patient is not nervous/anxious.     PMFS History:  Patient Active Problem List   Diagnosis Date Noted   Allergic rhinitis 01/24/2024   Wheezing 01/24/2024   Statin intolerance 01/09/2024   Chronic low back pain 11/28/2023   Acute pain of right shoulder 11/28/2023   Dyslipidemia 11/28/2023   Mild cognitive impairment with memory loss 09/15/2023   CHF (congestive heart failure)    Hypotension 07/13/2023   Skin ulcer of nose 07/13/2023   Chalazion left upper eyelid 04/18/2023   Pain of right sacroiliac joint 01/14/2023   Lumbar degenerative disc disease 12/21/2022   Chronic left SI joint pain 11/30/2022   Inflammation of sacroiliac joint 11/16/2022   Stool incontinence 11/16/2022   Grief 10/06/2022   History of basal cell carcinoma 09/29/2022   Stress at home 07/21/2022   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   Gait disorder 02/28/2022   Melanocytic nevi of trunk 02/22/2022   Seborrheic dermatitis 02/22/2022   Urticaria 02/22/2022   Concussion 10/29/2021   Allergic rhinitis due to animal (cat) (dog) hair and dander 08/07/2021   Allergic rhinitis due to pollen 08/07/2021   Mild intermittent asthma 08/07/2021   Cervical spine instability 05/15/2021   Cervical spinal cord compression 05/04/2021   Degenerative disc disease, cervical 03/11/2021   Spondylolisthesis, cervical region 03/11/2021   Trochanteric bursitis of  right hip 09/22/2020   Meningioma 03/25/2020   Cholelithiasis 09/09/2019   Cervical spondylosis 01/05/2019   Neck pain 12/26/2018   Generalized anxiety disorder 08/22/2018   Contact dermatitis and eczema 06/14/2018   Constipation 11/11/2016   Sinusitis, chronic 08/06/2016   Rash 04/20/2016   Aortic valve sclerosis 03/12/2015    Fatigue 09/08/2014   Increased frequency of urination 08/19/2014   Urinary urgency 08/19/2014   Postherpetic neuralgia 10/02/2013   Cerumen impaction 07/16/2013   Insomnia 01/24/2013   Upper respiratory infection 08/03/2012   Pruritus of skin 03/11/2012   MVP (mitral valve prolapse)    Multiple thyroid nodules 10/04/2011   Herpes zoster 09/07/2011   Essential hypertension, benign 07/27/2011   Atrophic vaginitis    Hyperlipemia, mixed 03/15/2011   OAB (overactive bladder) 09/02/2010   Hypothyroidism 03/03/2010   Irritable bowel syndrome 09/04/2008   History of colonic polyps 09/04/2008   Atrophic gastritis 09/03/2008   Duodenitis 09/03/2008   Tibialis tendinitis 03/20/2008   Osteoarthritis 02/25/2008   Lactose intolerance 11/20/2007   GERD (gastroesophageal reflux disease) 11/20/2007   Osteoporosis 11/20/2007    Past Medical History:  Diagnosis Date   Allergic rhinitis due to animal (cat) (dog) hair and dander 08/07/2021   Allergic rhinitis due to pollen 08/07/2021   Aortic valve sclerosis 03/12/2015   Atrophic gastritis 09/03/2008   Atrophic vaginitis    Baker's cyst    Left-Dr. Lequita Halt   Bladder pain 08/19/2014   Chronic  Take Vesicare 10 mg/d  UA was ok  On Premarin cream PV per GYN  On Vesicare  F/u w/Dr Monico Blitz     Cataract    Dr. Dione Booze   Cervical spinal cord compression 05/04/2021   Cervical spine instability 05/15/2021   Dr. Venetia Maxon: plan -  posterior C1-2 decompression and instrumented fusion.  MRI: Progressive soft tissue pannus at C1-2 is now creating mass  effect on the craniocervical junction with distortion of the upper  spinal cord. This is likely related rheumatoid arthritis  C spine CT: Fracture of the anterior arch of C1 with 12 mm separation. Nondisplaced fracture posterior arch of C1. There is later   Cervical spondylosis 01/05/2019   Chalazion left upper eyelid 04/18/2023   X2  Keflex po - too big... Will use Ceftin  F/u w/Ophthalmology   CHF (congestive  heart failure)    Cholelithiasis 09/09/2019   Per Dr Marina Goodell: "At this point we mutually decided to watch her abdominal complaints.  If symptoms accelerate or become more classic for symptomatic cholelithiasis, then she is agreeable to surgical referral.  She will keep me posted"   Chronic left SI joint pain 11/30/2022   Closed fracture of first cervical vertebra 08/07/2021   Closed fracture of second cervical vertebra 08/07/2021   Concussion 10/29/2021   s/p a hard fall after she rushed and slipped on the wet marble floor while visiting family in [place] a couple week ago.  She hit the side of her skull, had a skin laceration bled quite a bit.  She was dazed and according to her son Lorin Picket had a loss of consciousness of under a minute duration.  She felt dazed.  She was taken to ER.  2 staples were applied to her skin laceration on the left o   Constipation 11/11/2016   Chronic, multifactorial  Miralax prn  7/22 Postop constipation related to oxycodone and Robaxin.  We can discontinue oxycodone Robaxin.  The patient will use tramadol and Tylenol instead.  Use MiraLAX or Senokot as  to produce soft regular stools.  Discontinue Pepto-Bismol.  I think this should help with abdominal bloating complaint.      Take Metamucil daily  Senokot as to produce soft regular sto   Contact dermatitis and eczema 06/14/2018   Eczema vs other - 7/19  Cortaid prn  Depo-medrol IM 80 mg  Remove nail polish     Contusion of left chest wall 01/09/2015   Demaris fell and broke a rib #8 on the L last week, she had a rib X ray and a CT (Dr Thurston Hole).   Contusion of right knee 08/12/2016   R knee   Degenerative disc disease, cervical 03/11/2021   Duodenitis 09/03/2008   Essential hypertension, benign 07/27/2011   Chronic, mild  NAS diet  Cardiac CT scan for calcium scoring offered 1/20  1/24 HTN and s/p ER visit on 12/08/22 for SBP 180. Per Dr Lynelle Doctor: " Discussed with the patient that her restarting donepezil could have impacted her  symptoms today.  However, her blood pressure continues to be mildly elevated.  Will place her on 2.5 mg amlodipine to take if her blood pressure is over 170/100.  Nursing aide at   Fatigue 09/08/2014   10/15, 2/16, 9/17, 2/18  No other sx  Anishka is managing it with short rest periods 1-2/d     Fibroid    Gait disorder 02/28/2022   Worse  Multifactorial  Cont w/PT  In the balance class  Balance class q 1 week     [name] resolved after doing exercises by Dr Darrick Penna 11/2021     Generalized anxiety disorder 08/22/2018   Chronic   Lexapro - not taking  Weighted blanket  Lorazepam prn, - tolerance has developed   Potential benefits of a long term benzodiazepines  use as well as potential risks  and complications were explained to the patient and were aknowledged.     GERD (gastroesophageal reflux disease) 11/20/2007   Chronic. Better on Gluten free diet  Dexilant   Potential benefits of a long term PPI  use as well as potential risks  and complications were explained to the patient and were aknowledged.  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     History of basal cell carcinoma 09/29/2022   History of colonic polyps 09/04/2008   Hyperlipemia, mixed 03/15/2011   Chronic  On Simvastatin     Hypotension 07/13/2023   Stop Amlodipine     Hypothyroidism    Impacted cerumen of right ear 07/16/2013   Increased frequency of urination 08/19/2014   Inflammation of sacroiliac joint 11/16/2022   Insomnia 01/24/2013   Lorazepam prn   Potential benefits of a long term benzodiazepines  use as well as potential risks  and complications were explained to the patient and were aknowledged.        Irritable bowel syndrome 09/04/2008   Chronic   Marina Goodell MD, Wilhemina Bonito  2013 - much better on gluten free diet              Lactose intolerance  11/20/2007   Lactaid        Lumbar degenerative disc disease 12/21/2022   Based on MRI and her exam there is significant DDD  in lumbar spine     Lumbar spondylosis    Melanocytic nevi of trunk 02/22/2022   Meningioma 03/25/2020   Dr Venetia Maxon     Mild cognitive impairment with memory loss 09/15/2023   Mild intermittent asthma 08/07/2021   Multiple thyroid nodules 10/04/2011   2013 she was started on Synthroid - tol well  2017, 2022 [place]     MVP (mitral valve prolapse)    Antibiotics required for dental procedures   Neck pain 12/26/2018   Chronic  PT, traction, TENs unit was offered  Tramadol prn   Potential benefits of a long term opioids use as well as potential risks (i.e. addiction risk, apnea etc) and complications (i.e. Somnolence, constipation and others) were explained to the patient and were aknowledged.        OAB (overactive bladder) 09/02/2010   Dr Earlene Plater  Use Solifenacin prn  Worse - ?multifactorial  UA was normal  [place] Dec increased Memantine to 10 mg bid a month ago     Osteoarthritis 02/25/2008   Dr Ethelene Hal  Blue-Emu cream was recommended to use 2-3 times a day        Osteoporosis 11/20/2007   Chronic  Per GYN        Overactive bladder    Pain of right sacroiliac joint 01/14/2023   Postherpetic neuralgia 10/02/2013   Pruritus of skin 03/11/2012   S/p H zoster vaccination  Relapsing  8/12 - on face  8/14 - R ear/face  10/14 ?     Sacroiliitis    Seborrheic dermatitis 02/22/2022   Sinusitis, chronic 08/06/2016   Worse  Flonase, Atrovent nasal, Singulair, Claritin  ENT ref Dr Dorma Russell  CT sinuses     Skin ulcer of nose 07/13/2023   Soft pads on the nose  Derm appt     Spondylolisthesis, cervical region 03/11/2021   Stool incontinence 11/16/2022   12/23  Worse.  Stool leakage - new  D/c Aricept  Abd X ray to rule out constipation     Tibialis tendinitis 03/20/2008   Torn meniscus    bilateral   Trochanteric bursitis of right hip 09/22/2020   Upper respiratory infection  08/03/2012   9/13 - 3 wks  2/17  11/18 - asthmatic bronchitis     Urinary urgency 08/19/2014   2023 worse.  Obtain urinalysis.  D/c Aricept  UA was ok  On Premarin cream PV per GYN  On Vesicare  F/u w/Dr Monico Blitz     Urticaria 02/22/2022    Family History  Problem Relation Age of Onset   Heart disease Mother    Hypertension Mother    Osteoporosis Mother    Congestive Heart Failure Mother    Pancreatic cancer Father    Heart attack Brother    Drug abuse Brother        over dose    Healthy Son    Healthy Son    Colon cancer Neg Hx    Colon polyps Neg Hx    Rectal cancer Neg Hx    Stomach cancer Neg Hx    Past Surgical History:  Procedure Laterality Date   CATARACT EXTRACTION, BILATERAL     EYE SURGERY     POSTERIOR CERVICAL FUSION/FORAMINOTOMY N/A 05/15/2021   Procedure: Cervical One Laminectomy with resection of cyst, Fixation from Occiput to Cervical Four;  Surgeon: [name], MD;  Location: [facility] OR;  Service: Neurosurgery;  Laterality: N/A;   TONSILLECTOMY     Social History   Social History  Narrative   Regular exercise-yesDaily caffeine use   Right handed   Lives alone   retired   Immunization History  Administered Date(s) Administered   Fluad Quad(high Dose 65+) 08/04/2019, 09/16/2021, 09/12/2022   Fluad Trivalent(High Dose 65+) 09/01/2023   Influenza Whole 09/29/2007, 08/21/2008, 09/02/2010, 07/30/2012   Influenza, High Dose Seasonal PF 09/03/2013, 02/19/2015, 11/12/2015, 08/06/2016, 11/11/2016, 08/20/2017, 08/31/2017, 09/20/2018, 09/12/2019   Influenza,inj,Quad PF,6+ Mos 12/17/2014, 07/30/2015   PFIZER(Purple Top)SARS-COV-2 Vaccination 12/12/2019, 12/31/2019, 07/29/2020, 07/29/2020, 07/31/2020, 09/10/2020   Pfizer Covid-19 Vaccine Bivalent Booster 68yrs & up 08/21/2021   Pfizer(Comirnaty)Fall Seasonal Vaccine 12 years and older 09/29/2022   Pneumococcal Conjugate-13 01/09/2014   Pneumococcal Polysaccharide-23 09/26/2006, 08/12/2015, 11/11/2016, 08/31/2017,  09/10/2020, 09/10/2021   Td 04/14/2010   Tdap 06/18/2020   Typhoid Inactivated 02/23/2013   Zoster, Live 12/09/2006     Objective: Vital Signs: BP 128/75 (BP Location: Left Arm, Patient Position: Sitting, Cuff Size: Normal)   Pulse 83   Resp 14   Ht 5' (1.524 m)   Wt 109 lb (49.4 kg)   BMI 21.29 kg/m    Physical Exam Vitals and nursing note reviewed.  Constitutional:      Appearance: She is well-developed.  HENT:     Head: Normocephalic and atraumatic.  Eyes:     Conjunctiva/sclera: Conjunctivae normal.  Cardiovascular:     Rate and Rhythm: Normal rate and regular rhythm.     Heart sounds: Normal heart sounds.  Pulmonary:     Effort: Pulmonary effort is normal.     Breath sounds: Normal breath sounds.  Abdominal:     General: Bowel sounds are normal.     Palpations: Abdomen is soft.  Musculoskeletal:     Cervical back: Normal range of motion.  Lymphadenopathy:     Cervical: No cervical adenopathy.  Skin:    General: Skin is warm and dry.     Capillary Refill: Capillary refill takes less than 2 seconds.  Neurological:     Mental Status: She is alert and oriented to person, place, and time.  Psychiatric:        Behavior: Behavior normal.      Musculoskeletal Exam: Patient had limited range of motion of the cervical spine due to previous fusion.  She had limited range of motion of the thoracic and lumbar spine with some discomfort in the lower lumbar region.  She had bilateral trapezius spasm without discomfort.  Shoulder joints and elbow joints were in good range of motion.  Wrist joints were in good range of motion.  Bilateral CMP, PIP and DIP thickening with no synovitis was noted.  Hip joints and knee joints with good range of motion.  No warmth swelling or effusion was noted.  There was no tenderness over ankles.  Bilateral bunions were noted.  CDAI Exam: CDAI Score: -- Patient Global: --; Provider Global: -- Swollen: --; Tender: -- Joint Exam 02/29/2024   No  joint exam has been documented for this visit   There is currently no information documented on the homunculus. Go to the Rheumatology activity and complete the homunculus joint exam.  Investigation: No additional findings.  Imaging: No results found.  Recent Labs: Lab Results  Component Value Date   WBC 8.9 02/15/2024   HGB 13.3 02/15/2024   PLT 282.0 02/15/2024   NA 129 (L) 02/15/2024   K 3.9 02/15/2024   CL 95 (L) 02/15/2024   CO2 26 02/15/2024   GLUCOSE 109 (H) 02/15/2024   BUN 12 02/15/2024   CREATININE 0.52  02/15/2024   BILITOT 0.5 02/15/2024   ALKPHOS 136 (H) 02/15/2024   AST 23 02/15/2024   ALT 45 (H) 02/15/2024   PROT 7.6 02/15/2024   ALBUMIN 4.3 02/15/2024   CALCIUM 9.3 02/15/2024   GFRAA 87 07/17/2020    Speciality Comments: No specialty comments available.  Procedures:  No procedures performed Allergies: Bee venom, Bacitracin-polymyxin b, Clarithromycin, Diclofenac, Doxycycline, Neosporin [neomycin-bacitracin zn-polymyx], and Hydrocortisone   Assessment / Plan:     Visit Diagnoses: Primary osteoarthritis of both hands-she has bilateral PIP and DIP and CMC thickening.  Joint protection muscle strengthening was discussed.  She denies any discomfort in her hands today.  Primary osteoarthritis of both feet-patient denies any discomfort in her feet.  Spasm of both trapezius muscles-she had bilateral trapezius spasm.  She states the pain has been gradually getting better with the topical agents.  She has been using arthritis cream.  DDD (degenerative disc disease), cervical - She had fusion in the past by Dr. Venetia Maxon.  She is followed by Dr. Jake Samples.  She was recently evaluated by Dr. Darrick Penna for right-sided radiculopathy.  The symptoms are gradually getting better.  She is going to a Systems analyst.  Piriformis syndrome of both sides - She had injection by Dr. Lorrine Kin.  The pain has improved after the injection.  She has intermittent discomfort.  Degeneration of  intervertebral disc of lumbar region without discogenic back pain or lower extremity pain - Grade 1 anterolisthesis of L4 versus L5 and grade 1 retrolisthesis of L1 versus L2 and L2 versus L3.  She had multilevel spondylosis and facet joint arthropathy  Chronic SI joint pain-chronic discomfort.  Age-related osteoporosis without current pathological fracture - she is on Prolia by her GYN.  Other medical problems listed as follows:  Gait instability  History of IBS  Essential hypertension, benign  History of hyperlipidemia  History of gastroesophageal reflux (GERD)  Hypothyroidism due to acquired atrophy of thyroid  Grieving - patient lost her husband in October 2023.  There were married for 64 years.  Orders: No orders of the defined types were placed in this encounter.  No orders of the defined types were placed in this encounter.    Follow-Up Instructions: Return in about 6 months (around 08/30/2024) for Osteoarthritis.   Pollyann Savoy, MD  Note - This record has been created using Animal nutritionist.  Chart creation errors have been sought, but may not always  have been located. Such creation errors do not reflect on  the standard of medical care.

## 2024-02-16 ENCOUNTER — Ambulatory Visit (INDEPENDENT_AMBULATORY_CARE_PROVIDER_SITE_OTHER): Admitting: Internal Medicine

## 2024-02-16 ENCOUNTER — Encounter: Payer: Self-pay | Admitting: Internal Medicine

## 2024-02-16 VITALS — BP 116/70 | HR 84 | Temp 97.9°F | Ht 60.0 in | Wt 108.8 lb

## 2024-02-16 DIAGNOSIS — J069 Acute upper respiratory infection, unspecified: Secondary | ICD-10-CM

## 2024-02-16 DIAGNOSIS — E034 Atrophy of thyroid (acquired): Secondary | ICD-10-CM | POA: Diagnosis not present

## 2024-02-16 DIAGNOSIS — E782 Mixed hyperlipidemia: Secondary | ICD-10-CM

## 2024-02-16 DIAGNOSIS — K219 Gastro-esophageal reflux disease without esophagitis: Secondary | ICD-10-CM | POA: Diagnosis not present

## 2024-02-16 LAB — POC INFLUENZA A&B (BINAX/QUICKVUE)
Influenza A, POC: NEGATIVE
Influenza B, POC: NEGATIVE

## 2024-02-16 NOTE — Assessment & Plan Note (Signed)
Z pac Rx 

## 2024-02-16 NOTE — Assessment & Plan Note (Signed)
 Check TSH

## 2024-02-16 NOTE — Assessment & Plan Note (Signed)
 Krill oil - too large Try Nordic Naturals Omega-3, Lemon Flavor - 16 oz - 1560 mg Omega-3 - Fish Oil - EPA & DHA - Immune Support, Brain & Heart Health, Optimal Wellness - Non-GMO - 96 Servings  Simvastatin d/c due to arthralgia

## 2024-02-16 NOTE — Telephone Encounter (Signed)
 Pt has an appointment with PCP today at 4pm.

## 2024-02-16 NOTE — Patient Instructions (Signed)
 Nordic Naturals Omega-3, Lemon Flavor - 16 oz - 1560 mg Omega-3 - Fish Oil - EPA & DHA - Immune Support, Brain & Heart Health, Optimal Wellness - Non-GMO - 96 Servings

## 2024-02-16 NOTE — Telephone Encounter (Signed)
 Attempted to call pt to inform of providers response as follows "OK Z pack - Rx emailed Keep [facility] tomorrow Thx"

## 2024-02-16 NOTE — Progress Notes (Signed)
 Subjective:  Patient ID: [name], female    DOB: [birth year]  Age: 88 y.o. MRN: [id number]  CC: GI Problem (Patient has been feeling ill since Sunday night. Patient had been experiencing diarrhea, emesis, body aches (lower back, legs, shoulders) Since then has completely stopped taking medications (except for solifenacin 5mg  last night). Patient has been eating bland foods as well as electrolyte packet.), Bleeding/Bruising (Noticeable bruising on arms. Right arm bruise present around wrist, just popped up over night), and Chest Pain   HPI [name] presents for URI x 5 day, HTN, [name], neck pain,OAB Feeling much better  Outpatient Medications Prior to Visit  Medication Sig Dispense Refill  . acetaminophen (TYLENOL) 500 MG tablet Take 1,000 mg by mouth every 6 (six) hours as needed for mild pain.    [name] albuterol (VENTOLIN HFA) 108 (90 Base) MCG/ACT inhaler Take 2 puffs every 4-6 hours as needed    . amLODipine (NORVASC) 2.5 MG tablet Take 2.5 mg by mouth as needed.    . Ascorbic Acid (VITAMIN C) 100 MG tablet Take 1 tablet (100 mg total) by mouth daily. 100 tablet 1  . Azelastine HCl 0.15 % SOLN Place into both nostrils. Place into both nostrils.    [name] azithromycin (ZITHROMAX Z-PAK) 250 MG tablet As directed 6 tablet 0  . Calcium Carbonate-Vitamin D (CALCIUM CARBONATE W/VITAMIN D PO) Take 1 tablet by mouth daily.    . ciprofloxacin (CILOXAN) 0.3 % ophthalmic solution INSTILL ONE DROP IN THE LEFT EYE FOUR TIMES DAILY through monday, Jan 27. THEN USE IN THE LEFT EYE TWICE DAILY through friday, January 31, THEN STOP.    [name] cyanocobalamin (VITAMIN B12) 1000 MCG tablet Take 1 tablet (1,000 mcg total) by mouth daily. 100 tablet 3  . desonide (DESOWEN) 0.05 % cream Apply 1 application topically as needed.    [name] EPINEPHrine (EPIPEN 2-PAK) 0.3 mg/0.3 mL IJ SOAJ injection Inject 0.3 mLs (0.3 mg total) into the muscle as needed. 1 Device 1  . erythromycin ophthalmic ointment Place 1 Application into  both eyes as needed.    . famotidine (PEPCID) 40 MG tablet Take 40 mg by mouth as needed for heartburn or indigestion.    . hydrocortisone (ANUSOL-HC) 2.5 % rectal cream Place 1 Application rectally as needed for hemorrhoids or anal itching.    [name] ipratropium (ATROVENT) 0.03 % nasal spray Place 1 spray into both nostrils at bedtime.    [name] levocetirizine (XYZAL) 5 MG tablet Take 5 mg by mouth every evening.    [name] levothyroxine (SYNTHROID) 50 MCG tablet TAKE ONE TABLET BY MOUTH DAILY BEFORE BREAKFAST 30 tablet 5  . loratadine (CLARITIN) 10 MG tablet Take 1 tablet (10 mg total) by mouth daily. 100 tablet 3  . LORazepam (ATIVAN) 1 MG tablet Take 2-3 tablets at bedtime and 1-2 tablets in the daytime as needed. 150 tablet 2  . MegaRed Omega-3 Krill Oil 500 MG CAPS Take 1 capsule by mouth every morning. 100 capsule 3  . memantine (NAMENDA XR) 14 MG CP24 24 hr capsule Take 1 capsule (14 mg total) by mouth at bedtime. 90 capsule 11  . montelukast (SINGULAIR) 10 MG tablet Take 1 tablet (10 mg total) by mouth at bedtime. 90 tablet 1  . oxymetazoline (AFRIN 12 HOUR) 0.05 % nasal spray Place 1 spray into both nostrils as needed.    . pantoprazole (PROTONIX) 40 MG tablet TAKE ONE TABLET BY MOUTH TWICE DAILY BEFORE MEALS 60 tablet 5  . polyethylene glycol  powder (GLYCOLAX/MIRALAX) 17 GM/SCOOP powder Take 17 g by mouth 2 (two) times daily as needed for moderate constipation. 500 g 3  . psyllium (METAMUCIL) 58.6 % packet Take 1 packet by mouth as needed.    . solifenacin (VESICARE) 10 MG tablet Take 1 tablet (10 mg total) by mouth daily as needed (bladder). 90 tablet 3  . triamcinolone (NASACORT) 55 MCG/ACT AERO nasal inhaler Place 2 sprays into the nose as needed.    . triamcinolone ointment (KENALOG) 0.1 % Apply 1 Application topically 3 (three) times daily. As needed for itching 80 g 2   No facility-administered medications prior to visit.    ROS: Review of Systems  Constitutional:  Positive for chills and  fatigue. Negative for activity change, appetite change and unexpected weight change.  HENT:  Positive for rhinorrhea and sore throat. Negative for congestion, mouth sores, sinus pressure, trouble swallowing and voice change.   Eyes:  Negative for visual disturbance.  Respiratory:  Negative for cough and chest tightness.   Gastrointestinal:  Negative for abdominal distention, abdominal pain, nausea and vomiting.  Genitourinary:  Negative for difficulty urinating, frequency and vaginal pain.  Musculoskeletal:  Negative for back pain and gait problem.  Skin:  Negative for pallor and rash.  Neurological:  Negative for dizziness, tremors, weakness, numbness and headaches.  Hematological:  Bruises/bleeds easily.  Psychiatric/Behavioral:  Negative for confusion and sleep disturbance.     Objective:  BP 116/70   Pulse 84   Temp 97.9 F (36.6 C)   Ht 5' (1.524 m)   Wt 108 lb 12.8 oz (49.4 kg)   SpO2 95%   BMI 21.25 kg/m   BP Readings from Last 3 Encounters:  02/16/24 116/70  02/09/24 137/68  01/24/24 118/68    Wt Readings from Last 3 Encounters:  02/16/24 108 lb 12.8 oz (49.4 kg)  02/09/24 113 lb (51.3 kg)  01/24/24 113 lb (51.3 kg)    Physical Exam Constitutional:      General: She is not in acute distress.    Appearance: She is well-developed. She is not toxic-appearing.  HENT:     Head: Normocephalic.     Right Ear: External ear normal.     Left Ear: External ear normal.     Nose: Nose normal.  Eyes:     General:        Right eye: No discharge.        Left eye: No discharge.     Conjunctiva/sclera: Conjunctivae normal.     Pupils: Pupils are equal, round, and reactive to light.  Neck:     Thyroid: No thyromegaly.     Vascular: No JVD.     Trachea: No tracheal deviation.  Cardiovascular:     Rate and Rhythm: Normal rate and regular rhythm.     Heart sounds: Normal heart sounds.  Pulmonary:     Effort: No respiratory distress.     Breath sounds: No stridor. No  wheezing.  Abdominal:     General: Bowel sounds are normal. There is no distension.     Palpations: Abdomen is soft. There is no mass.     Tenderness: There is no abdominal tenderness. There is no guarding or rebound.  Musculoskeletal:        General: No tenderness.     Cervical back: Normal range of motion and neck supple. No rigidity.  Lymphadenopathy:     Cervical: No cervical adenopathy.  Skin:    Findings: No erythema or rash.  Neurological:  Cranial Nerves: No cranial nerve deficit.     Motor: No abnormal muscle tone.     Coordination: Coordination normal.     Deep Tendon Reflexes: Reflexes normal.  Psychiatric:        Behavior: Behavior normal.        Thought Content: Thought content normal.        Judgment: Judgment normal.    Lab Results  Component Value Date   WBC 8.9 02/15/2024   HGB 13.3 02/15/2024   HCT 39.2 02/15/2024   PLT 282.0 02/15/2024   GLUCOSE 109 (H) 02/15/2024   CHOL 274 (H) 02/15/2024   TRIG 181.0 (H) 02/15/2024   HDL 72.00 02/15/2024   LDLDIRECT 98.0 08/13/2021   LDLCALC 166 (H) 02/15/2024   ALT 45 (H) 02/15/2024   AST 23 02/15/2024   NA 129 (L) 02/15/2024   K 3.9 02/15/2024   CL 95 (L) 02/15/2024   CREATININE 0.52 02/15/2024   BUN 12 02/15/2024   CO2 26 02/15/2024   TSH 1.96 02/15/2024   HGBA1C 5.5 06/13/2020    ECHOCARDIOGRAM COMPLETE Result Date: 01/02/2024    ECHOCARDIOGRAM REPORT   Patient Name:   NATAKI MCCRUMB Date of Exam: 01/02/2024 Medical Rec #:  [id number]     Height:       60.0 in Accession #:    [id number]    Weight:       113.0 lb Date of Birth:  [birth year]      BSA:          1.464 m Patient Age:    87 years      BP:           120/70 mmHg Patient Gender: F             HR:           83 bpm. Exam Location:  Outpatient Procedure: 2D Echo, Cardiac Doppler and Color Doppler Indications:    CHF  History:        Patient has prior history of Echocardiogram examinations, most                 recent 02/11/2023. CHF, Signs/Symptoms:Hypotension  and Fatigue;                 Risk Factors:Dyslipidemia.  Sonographer:    Vern Claude Referring Phys: [id number] DANIEL R BENSIMHON IMPRESSIONS  1. Left ventricular ejection fraction, by estimation, is 55 to 60%. The left ventricle has normal function. The left ventricle has no regional wall motion abnormalities. Left ventricular diastolic parameters are consistent with Grade I diastolic dysfunction (impaired relaxation).  2. Right ventricular systolic function is normal. The right ventricular size is normal. There is normal pulmonary artery systolic pressure.  3. The mitral valve is abnormal. Mild mitral valve regurgitation. No evidence of mitral stenosis. Moderate mitral annular calcification.  4. The aortic valve is tricuspid. There is moderate calcification of the aortic valve. There is moderate thickening of the aortic valve. Aortic valve regurgitation is moderate. Mild aortic valve stenosis.  5. The inferior vena cava is normal in size with greater than 50% respiratory variability, suggesting right atrial pressure of 3 mmHg. FINDINGS  Left Ventricle: Left ventricular ejection fraction, by estimation, is 55 to 60%. The left ventricle has normal function. The left ventricle has no regional wall motion abnormalities. The left ventricular internal cavity size was normal in size. There is  no left ventricular hypertrophy. Left ventricular diastolic parameters are consistent with Grade I diastolic dysfunction (  impaired relaxation). Right Ventricle: The right ventricular size is normal. No increase in right ventricular wall thickness. Right ventricular systolic function is normal. There is normal pulmonary artery systolic pressure. The tricuspid regurgitant velocity is 2.60 m/s, and  with an assumed right atrial pressure of 3 mmHg, the estimated right ventricular systolic pressure is 30.0 mmHg. Left Atrium: Left atrial size was normal in size. Right Atrium: Right atrial size was normal in size. Pericardium: There is no  evidence of pericardial effusion. Mitral Valve: The mitral valve is abnormal. There is mild thickening of the mitral valve leaflet(s). There is mild calcification of the mitral valve leaflet(s). Moderate mitral annular calcification. Mild mitral valve regurgitation. No evidence of mitral  valve stenosis. MV peak gradient, 2.5 mmHg. The mean mitral valve gradient is 1.0 mmHg. Tricuspid Valve: The tricuspid valve is normal in structure. Tricuspid valve regurgitation is mild . No evidence of tricuspid stenosis. Aortic Valve: The aortic valve is tricuspid. There is moderate calcification of the aortic valve. There is moderate thickening of the aortic valve. Aortic valve regurgitation is moderate. Aortic regurgitation PHT measures 357 msec. Mild aortic stenosis is present. Aortic valve mean gradient measures 6.0 mmHg. Aortic valve peak gradient measures 11.4 mmHg. Aortic valve area, by VTI measures 2.06 cm. Pulmonic Valve: The pulmonic valve was normal in structure. Pulmonic valve regurgitation is not visualized. No evidence of pulmonic stenosis. Aorta: The aortic root is normal in size and structure. Venous: The inferior vena cava is normal in size with greater than 50% respiratory variability, suggesting right atrial pressure of 3 mmHg. IAS/Shunts: No atrial level shunt detected by color flow Doppler.  LEFT VENTRICLE PLAX 2D LVIDd:         4.20 cm      Diastology LVIDs:         2.80 cm      LV e' medial:    9.57 cm/s LV PW:         0.70 cm      LV E/e' medial:  5.5 LV IVS:        0.70 cm      LV e' lateral:   9.14 cm/s LVOT diam:     2.00 cm      LV E/e' lateral: 5.7 LV SV:         57 LV SV Index:   39 LVOT Area:     3.14 cm  LV Volumes (MOD) LV vol d, MOD A2C: 107.0 ml LV vol d, MOD A4C: 84.4 ml LV vol s, MOD A2C: 30.5 ml LV vol s, MOD A4C: 24.8 ml LV SV MOD A2C:     76.5 ml LV SV MOD A4C:     84.4 ml LV SV MOD BP:      68.1 ml RIGHT VENTRICLE             IVC RV Basal diam:  3.70 cm     IVC diam: 1.30 cm RV Mid  diam:    2.50 cm RV S prime:     15.50 cm/s TAPSE (M-mode): 2.3 cm LEFT ATRIUM             Index        RIGHT ATRIUM          Index LA diam:        3.20 cm 2.19 cm/m   RA Area:     9.05 cm LA Vol (A2C):   23.2 ml 15.84 ml/m  RA Volume:   17.50 ml  11.95 ml/m LA Vol (A4C):   24.2 ml 16.53 ml/m LA Biplane Vol: 26.1 ml 17.82 ml/m  AORTIC VALVE                     PULMONIC VALVE AV Area (Vmax):    1.83 cm      PV Vmax:       0.70 m/s AV Area (Vmean):   1.81 cm      PV Peak grad:  2.0 mmHg AV Area (VTI):     2.06 cm AV Vmax:           169.00 cm/s AV Vmean:          110.000 cm/s AV VTI:            0.279 m AV Peak Grad:      11.4 mmHg AV Mean Grad:      6.0 mmHg LVOT Vmax:         98.20 cm/s LVOT Vmean:        63.400 cm/s LVOT VTI:          0.183 m LVOT/AV VTI ratio: 0.66 AI PHT:            357 msec  AORTA Ao Root diam: 2.80 cm Ao Asc diam:  2.40 cm MITRAL VALVE               TRICUSPID VALVE MV Area (PHT): 3.70 cm    TR Peak grad:   27.0 mmHg MV Area VTI:   3.32 cm    TR Vmax:        260.00 cm/s MV Peak grad:  2.5 mmHg MV Mean grad:  1.0 mmHg    SHUNTS MV Vmax:       0.80 m/s    Systemic VTI:  0.18 m MV Vmean:      53.8 cm/s   Systemic Diam: 2.00 cm MV Decel Time: 205 msec MV E velocity: 52.40 cm/s MV A velocity: 83.80 cm/s MV E/A ratio:  0.63 Charlton Haws MD Electronically signed by Charlton Haws MD Signature Date/Time: 01/02/2024/2:52:03 PM    Final     Assessment & Plan:   Problem List Items Addressed This Visit     Hypothyroidism   Check TSH      Relevant Orders   Comprehensive metabolic panel   CBC with Differential/Platelet   Hyperlipemia, mixed - Primary   Krill oil - too large Try [employer] Omega-3, Lemon Flavor - 16 oz - 1560 mg Omega-3 - Fish Oil - EPA & DHA - Immune Support, Brain & Heart Health, [employer] - Non-GMO - 96 Servings  Simvastatin d/c due to arthralgia      Relevant Orders   TSH   T4, free   Upper respiratory infection   Zpac Rx      Relevant Orders    Comprehensive metabolic panel   CBC with Differential/Platelet      No orders of the defined types were placed in this encounter.     Follow-up: Return in about 2 months (around 04/17/2024) for a follow-up visit.  Sonda Primes, MD

## 2024-02-20 NOTE — Assessment & Plan Note (Signed)
Chronic. Better on Gluten free diet Dexilant  Potential benefits of a long term PPI  use as well as potential risks  and complications were explained to the patient and were aknowledged. GERD wedge

## 2024-02-24 ENCOUNTER — Ambulatory Visit: Payer: Self-pay

## 2024-02-24 ENCOUNTER — Ambulatory Visit: Admitting: Family

## 2024-02-24 VITALS — BP 138/72 | HR 80 | Temp 98.2°F | Ht 61.0 in

## 2024-02-24 DIAGNOSIS — J069 Acute upper respiratory infection, unspecified: Secondary | ICD-10-CM

## 2024-02-24 DIAGNOSIS — R051 Acute cough: Secondary | ICD-10-CM

## 2024-02-24 MED ORDER — METHYLPREDNISOLONE 4 MG PO TBPK: ORAL_TABLET | ORAL | 0 refills | Status: DC

## 2024-02-24 NOTE — Telephone Encounter (Signed)
 Chief Complaint: Cough, post nasal drip Symptoms: cough-patient reports coughing up mucus with blood, post nasal drip Frequency: coughing up mucus with blood happened today Pertinent Negatives: Patient denies SOB Disposition: [] ED /[] Urgent Care (no appt availability in office) / [] Appointment(In office/virtual)/ []  [facility]/ [] Home Care/ [] Refused Recommended Disposition /[] [facility]/ [x]  Follow-up with PCP Additional Notes: Patient called with concerns for productive cough with blood in sputum. Patient reports post nasal drip that has continued since she last saw PCP. Patient is concerned with the blood in her sputum. Patient reports only having one episode of blood in sputum. Patient states blood is bright red. Attempted to get more information but patient wasn't able to describe anything more. Patient is recommended to see HCP today. Patient is highly requesting to only see her PCP-no appointments available today with PCP or in PCP office. Patient is wanting to see if PCP can fit her in today. Patient is requesting a phone call this morning from PCP staff. Please call [phone]   Copied from CRM [id number]. Topic: Clinical - Red Word Triage >> Feb 24, 2024  9:29 AM Gurney Maxin H wrote: [facility] that prompted transfer to Nurse Triage: Coughed up mucus from throat and it was blood, chest tightness. Took zpack a week ago didn't help. Reason for Disposition  [1] Coughed up blood AND [2] > 1 tablespoon (15 ml)   (Exception: Blood-tinged sputum.)  Answer Assessment - Initial Assessment Questions 1. ONSET: "When did the cough begin?"      Prior to seeing MD on 3/20 2. SEVERITY: "How bad is the cough today?"      Not bad today 3. SPUTUM: "Describe the color of your sputum" (none, dry cough; clear, white, yellow, green)     clear 4. HEMOPTYSIS: "Are you coughing up any blood?" If so ask: "How much?" (flecks, streaks, tablespoons, etc.)     Patient wouldn't given any more  information about the blood she coughing up. She said she coughed up mucus but that there was blood 5. DIFFICULTY BREATHING: "Are you having difficulty breathing?" If Yes, ask: "How bad is it?" (e.g., mild, moderate, severe)    - MILD: No SOB at rest, mild SOB with walking, speaks normally in sentences, can lie down, no retractions, pulse < 100.    - MODERATE: SOB at rest, SOB with minimal exertion and prefers to sit, cannot lie down flat, speaks in phrases, mild retractions, audible wheezing, pulse 100-120.    - SEVERE: Very SOB at rest, speaks in single words, struggling to breathe, sitting hunched forward, retractions, pulse > 120      no 6. FEVER: "Do you have a fever?" If Yes, ask: "What is your temperature, how was it measured, and when did it start?"     no 7. CARDIAC HISTORY: "Do you have any history of heart disease?" (e.g., heart attack, congestive heart failure)      No 8. LUNG HISTORY: "Do you have any history of lung disease?"  (e.g., pulmonary embolus, asthma, emphysema)     No 9. PE RISK FACTORS: "Do you have a history of blood clots?" (or: recent major surgery, recent prolonged travel, bedridden)     No 10. OTHER SYMPTOMS: "Do you have any other symptoms?" (e.g., runny nose, wheezing, chest pain)       Post nasal drip 12. TRAVEL: "Have you traveled out of the country in the last month?" (e.g., travel history, exposures)       no  Protocols used:  Cough - Acute Productive-A-AH

## 2024-02-24 NOTE — Telephone Encounter (Signed)
 Called and scheduled acute visit w Carole Civil as PCP is not in office

## 2024-02-26 ENCOUNTER — Encounter: Payer: Self-pay | Admitting: Family

## 2024-02-26 NOTE — Progress Notes (Signed)
 Acute Office Visit  Subjective:     Patient ID: [name], female    DOB: 18-May-1936, 88 y.o.   MRN: [id number]  Chief Complaint  Patient presents with  . Cough    HPI Patient is an 88 year old female patient of Dr. Posey Rea in with c/o cough, congestion, clear phlegm x 3 days. Has a tickle in her throat. Is concerned that she has blood in her phlegm today after blowing her nose. Has not taken any medication for relief.   Review of Systems  Constitutional: Negative.  Negative for chills and fever.  HENT:  Positive for congestion.   Respiratory:  Positive for cough and sputum production. Negative for shortness of breath and wheezing.   Cardiovascular: Negative.   Gastrointestinal: Negative.   Musculoskeletal: Negative.   Skin: Negative.   Neurological: Negative.   Psychiatric/Behavioral: Negative.    Past Medical History:  Diagnosis Date  . Allergic rhinitis due to animal (cat) (dog) hair and dander 08/07/2021  . Allergic rhinitis due to pollen 08/07/2021  . Aortic valve sclerosis 03/12/2015  . 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 Atrophic vaginitis   . Baker's cyst    Left-Dr. Aluisio  . Bladder pain 08/19/2014   Chronic  Take Vesicare 10 mg/d  UA was ok  On Premarin cream PV per GYN  On Vesicare  F/u w/Dr Monico Blitz    . Cataract    Dr. Dione Booze  . Cervical spinal cord compression 05/04/2021  . Cervical spine instability 05/15/2021   Dr. Venetia Maxon: plan -  posterior C1-2 decompression and instrumented fusion.  MRI: Progressive soft tissue pannus at C1-2 is now creating mass  effect on the craniocervical junction with distortion of the upper  spinal cord. This is likely related rheumatoid arthritis  C spine CT: Fracture of the anterior arch of C1 with 12 mm separation. Nondisplaced fracture posterior arch of C1. There is later  . Cervical spondylosis 01/05/2019  . Chalazion left upper eyelid 04/18/2023   X2  Keflex po - too big... Will use Ceftin  F/u w/Ophthalmology  . CHF  (congestive heart failure)   . Cholelithiasis 09/09/2019   Per Dr Marina Goodell: "At this point we mutually decided to watch her abdominal complaints.  If symptoms accelerate or become more classic for symptomatic cholelithiasis, then she is agreeable to surgical referral.  She will keep me posted"  . Chronic left SI joint pain 11/30/2022  . Closed fracture of first cervical vertebra 08/07/2021  . Closed fracture of second cervical vertebra 08/07/2021  . Concussion 10/29/2021   s/p a hard fall after she rushed and slipped on the wet marble floor while visiting family in [place] a couple week ago.  She hit the side of her skull, had a skin laceration bled quite a bit.  She was dazed and according to her son Lorin Picket had a loss of consciousness of under a minute duration.  She felt dazed.  She was taken to ER.  2 staples were applied to her skin laceration on the left o  . Constipation 11/11/2016   Chronic, multifactorial  Miralax prn  7/22 Postop constipation related to oxycodone and Robaxin.  We can discontinue oxycodone Robaxin.  The patient will use tramadol and Tylenol instead.  Use MiraLAX or Senokot as to produce soft regular stools.  Discontinue Pepto-Bismol.  I think this should help with abdominal bloating complaint.      Take Metamucil daily  Senokot as to produce soft regular sto  . Contact  dermatitis and eczema 06/14/2018   Eczema vs other - 7/19  Cortaid prn  Depo-medrol IM 80 mg  Remove nail polish    . Contusion of left chest wall 01/09/2015   Akiera fell and broke a rib #8 on the L last week, she had a rib X ray and a CT (Dr Thurston Hole).  . Contusion of right knee 08/12/2016   R knee  . Degenerative disc disease, cervical 03/11/2021  . Duodenitis 09/03/2008  . Essential hypertension, benign 07/27/2011   Chronic, mild  NAS diet  Cardiac CT scan for calcium scoring offered 1/20  1/24 HTN and s/p ER visit on 12/08/22 for SBP 180. Per Dr Lynelle Doctor: " Discussed with the patient that her restarting donepezil  could have impacted her symptoms today.  However, her blood pressure continues to be mildly elevated.  Will place her on 2.5 mg amlodipine to take if her blood pressure is over 170/100.  Nursing aide at  . Fatigue 09/08/2014   10/15, 2/16, 9/17, 2/18  No other sx  Latoya is managing it with short rest periods 1-2/d    . Fibroid   . Gait disorder 02/28/2022   Worse  Multifactorial  Cont w/PT  In the balance class  Balance class q 1 week     [name] resolved after doing exercises by Dr Darrick Penna 11/2021    . Generalized anxiety disorder 08/22/2018   Chronic   Lexapro - not taking  Weighted blanket  Lorazepam prn, - tolerance has developed   Potential benefits of a long term benzodiazepines  use as well as potential risks  and complications were explained to the patient and were aknowledged.    [name] GERD (gastroesophageal reflux disease) 11/20/2007   Chronic. Better on Gluten free diet  Dexilant   Potential benefits of a long term PPI  use as well as potential risks  and complications were explained to the patient and were aknowledged.  GERD wedge             . 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  . Headache 07/10/2013  . Heart murmur   . Hemorrhoids   . Herpes zoster 09/07/2011   S/p H zoster vaccination  Relapsing  8/12 - on face  8/14 - R ear/face  10/14 ?    [name] History of basal cell carcinoma 09/29/2022  . History of colonic polyps 09/04/2008  . Hyperlipemia, mixed 03/15/2011   Chronic  On Simvastatin    . Hypotension 07/13/2023   Stop Amlodipine    . Hypothyroidism   . Impacted cerumen of right ear 07/16/2013  . Increased frequency of urination 08/19/2014  . Inflammation of sacroiliac joint 11/16/2022  . Insomnia 01/24/2013   Lorazepam prn   Potential benefits of a long term benzodiazepines  use as well as potential risks  and complications were explained to the patient and were aknowledged.       . Irritable bowel syndrome 09/04/2008   Chronic   Marina Goodell MD, Wilhemina Bonito  2013 - much better on gluten  free diet             . Lactose intolerance 11/20/2007   Lactaid       . Lumbar degenerative disc disease 12/21/2022   Based on MRI and her exam there is significant DDD in lumbar spine    . Lumbar spondylosis   . Melanocytic nevi of trunk 02/22/2022  . Meningioma 03/25/2020   Dr Venetia Maxon    . Mild cognitive impairment with memory loss 09/15/2023  .  Mild intermittent asthma 08/07/2021  . Multiple thyroid nodules 10/04/2011   2013 she was started on Synthroid - tol well  2017, 2022 [place]    . MVP (mitral valve prolapse)    Antibiotics required for dental procedures  . Neck pain 12/26/2018   Chronic  PT, traction, TENs unit was offered  Tramadol prn   Potential benefits of a long term opioids use as well as potential risks (i.e. addiction risk, apnea etc) and complications (i.e. Somnolence, constipation and others) were explained to the patient and were aknowledged.       [name] OAB (overactive bladder) 09/02/2010   Dr Earlene Plater  Use Solifenacin prn  Worse - ?multifactorial  UA was normal  [place] Dec increased Memantine to 10 mg bid a month ago    . Osteoarthritis 02/25/2008   Dr Ethelene Hal  Blue-Emu cream was recommended to use 2-3 times a day       . Osteoporosis 11/20/2007   Chronic  Per GYN       . Overactive bladder   . Pain of right sacroiliac joint 01/14/2023  . Postherpetic neuralgia 10/02/2013  . Pruritus of skin 03/11/2012   S/p H zoster vaccination  Relapsing  8/12 - on face  8/14 - R ear/face  10/14 ?    [name] Sacroiliitis   . Seborrheic dermatitis 02/22/2022  . Sinusitis, chronic 08/06/2016   Worse  Flonase, Atrovent nasal, Singulair, Claritin  ENT ref Dr Dorma Russell  CT sinuses    . Skin ulcer of nose 07/13/2023   Soft pads on the nose  Derm appt    . Spondylolisthesis, cervical region 03/11/2021  . Stool incontinence 11/16/2022   12/23  Worse.  Stool leakage - new  D/c Aricept  Abd X ray to rule out constipation    . Tibialis tendinitis 03/20/2008  . Torn meniscus    bilateral  . Trochanteric  bursitis of right hip 09/22/2020  . Upper respiratory infection 08/03/2012   9/13 - 3 wks  2/17  11/18 - asthmatic bronchitis    . Urinary urgency 08/19/2014   2023 worse.  Obtain urinalysis.  D/c Aricept  UA was ok  On Premarin cream PV per GYN  On Vesicare  F/u w/Dr Monico Blitz    . Urticaria 02/22/2022    Social History   Socioeconomic History  . Marital status: Married    Spouse name: Not on file  . Number of children: 2  . Years of education: 37  . Highest education level: Some college, no degree  Occupational History  . Occupation: retired    Associate Professor: RETIRED  Tobacco Use  . Smoking status: Former    Current packs/day: 0.00    Types: Cigarettes    Start date: 1963    Quit date: 1973    Years since quitting: 52.2    Passive exposure: Never  . Smokeless tobacco: Never  . Tobacco comments:    Former smoker 06/25/22  Vaping Use  . Vaping status: Never Used  Substance and Sexual Activity  . Alcohol use: Yes    Comment: Socially  . Drug use: No  . Sexual activity: Yes    Birth control/protection: Post-menopausal, None    Comment: 1st intercourse 88 yo-Fewer than 5 partners  Other Topics Concern  . Not on file  Social History Narrative   Regular exercise-yesDaily caffeine use   Right handed   Lives alone   retired   Social Drivers of Corporate investment banker Strain: Low Risk  (  03/02/2023)   Overall Financial Resource Strain (CARDIA)   . Difficulty of Paying Living Expenses: Not hard at all  Food Insecurity: No Food Insecurity (03/02/2023)   Hunger Vital Sign   . Worried About Programme researcher, broadcasting/film/video in the Last Year: Never true   . Ran Out of Food in the Last Year: Never true  Transportation Needs: No Transportation Needs (03/02/2023)   PRAPARE - Transportation   . Lack of Transportation (Medical): No   . Lack of Transportation (Non-Medical): No  Physical Activity: Sufficiently Active (03/02/2023)   Exercise Vital Sign   . Days of Exercise per Week: 5 days   .  Minutes of Exercise per Session: 40 min  Stress: No Stress Concern Present (03/02/2023)   [employer] of Occupational Health - Occupational Stress Questionnaire   . Feeling of Stress : Not at all  Social Connections: Moderately Integrated (03/02/2023)   Social Connection and Isolation Panel [NHANES]   . Frequency of Communication with Friends and Family: Twice a week   . Frequency of Social Gatherings with Friends and Family: Twice a week   . Attends Religious Services: More than 4 times per year   . Active Member of Clubs or Organizations: Yes   . Attends Banker Meetings: More than 4 times per year   . Marital Status: Widowed  Intimate Partner Violence: Not At Risk (03/02/2023)   Humiliation, Afraid, Rape, and Kick questionnaire   . Fear of Current or Ex-Partner: No   . Emotionally Abused: No   . Physically Abused: No   . Sexually Abused: No    Past Surgical History:  Procedure Laterality Date  . CATARACT EXTRACTION, BILATERAL    . EYE SURGERY    . POSTERIOR CERVICAL FUSION/FORAMINOTOMY N/A 05/15/2021   Procedure: Cervical One Laminectomy with resection of cyst, Fixation from Occiput to Cervical Four;  Surgeon: [name], MD;  Location: [facility] OR;  Service: Neurosurgery;  Laterality: N/A;  . TONSILLECTOMY      Family History  Problem Relation Age of Onset  . Heart disease Mother   . Hypertension Mother   . Osteoporosis Mother   . Congestive Heart Failure Mother   . Pancreatic cancer Father   . Heart attack Brother   . Drug abuse Brother        over dose   . Healthy Son   . Healthy Son   . Colon cancer Neg Hx   . Colon polyps Neg Hx   . Rectal cancer Neg Hx   . Stomach cancer Neg Hx     Allergies  Allergen Reactions  . Bee Venom Itching, Swelling and Rash    Itching, swelling and rash with bee stings, patient has epi pen  . Bacitracin-Polymyxin B Other (See Comments)  . Clarithromycin Other (See Comments)  . Diclofenac     ??rash from Voltaren gel   . Doxycycline     ??  . Neosporin [Neomycin-Bacitracin Zn-Polymyx]   . Hydrocortisone Rash    Current Outpatient Medications on File Prior to Visit  Medication Sig Dispense Refill  . acetaminophen (TYLENOL) 500 MG tablet Take 1,000 mg by mouth every 6 (six) hours as needed for mild pain.    [name] albuterol (VENTOLIN HFA) 108 (90 Base) MCG/ACT inhaler Take 2 puffs every 4-6 hours as needed    . amLODipine (NORVASC) 2.5 MG tablet Take 2.5 mg by mouth as needed.    . Ascorbic Acid (VITAMIN C) 100 MG tablet Take 1 tablet (100 mg  total) by mouth daily. 100 tablet 1  . Azelastine HCl 0.15 % SOLN Place into both nostrils. Place into both nostrils.    [name] azithromycin (ZITHROMAX Z-PAK) 250 MG tablet As directed 6 tablet 0  . Calcium Carbonate-Vitamin D (CALCIUM CARBONATE W/VITAMIN D PO) Take 1 tablet by mouth daily.    . ciprofloxacin (CILOXAN) 0.3 % ophthalmic solution INSTILL ONE DROP IN THE LEFT EYE FOUR TIMES DAILY through monday, Jan 27. THEN USE IN THE LEFT EYE TWICE DAILY through friday, January 31, THEN STOP.    [name] cyanocobalamin (VITAMIN B12) 1000 MCG tablet Take 1 tablet (1,000 mcg total) by mouth daily. 100 tablet 3  . desonide (DESOWEN) 0.05 % cream Apply 1 application topically as needed.    [name] EPINEPHrine (EPIPEN 2-PAK) 0.3 mg/0.3 mL IJ SOAJ injection Inject 0.3 mLs (0.3 mg total) into the muscle as needed. 1 Device 1  . erythromycin ophthalmic ointment Place 1 Application into both eyes as needed.    . famotidine (PEPCID) 40 MG tablet Take 40 mg by mouth as needed for heartburn or indigestion.    . hydrocortisone (ANUSOL-HC) 2.5 % rectal cream Place 1 Application rectally as needed for hemorrhoids or anal itching.    [name] ipratropium (ATROVENT) 0.03 % nasal spray Place 1 spray into both nostrils at bedtime.    [name] levocetirizine (XYZAL) 5 MG tablet Take 5 mg by mouth every evening.    [name] levothyroxine (SYNTHROID) 50 MCG tablet TAKE ONE TABLET BY MOUTH DAILY BEFORE BREAKFAST 30 tablet 5  .  loratadine (CLARITIN) 10 MG tablet Take 1 tablet (10 mg total) by mouth daily. 100 tablet 3  . LORazepam (ATIVAN) 1 MG tablet Take 2-3 tablets at bedtime and 1-2 tablets in the daytime as needed. 150 tablet 2  . MegaRed Omega-3 Krill Oil 500 MG CAPS Take 1 capsule by mouth every morning. 100 capsule 3  . memantine (NAMENDA XR) 14 MG CP24 24 hr capsule Take 1 capsule (14 mg total) by mouth at bedtime. 90 capsule 11  . montelukast (SINGULAIR) 10 MG tablet Take 1 tablet (10 mg total) by mouth at bedtime. 90 tablet 1  . oxymetazoline (AFRIN 12 HOUR) 0.05 % nasal spray Place 1 spray into both nostrils as needed.    . pantoprazole (PROTONIX) 40 MG tablet TAKE ONE TABLET BY MOUTH TWICE DAILY BEFORE MEALS 60 tablet 5  . polyethylene glycol powder (GLYCOLAX/MIRALAX) 17 GM/SCOOP powder Take 17 g by mouth 2 (two) times daily as needed for moderate constipation. 500 g 3  . psyllium (METAMUCIL) 58.6 % packet Take 1 packet by mouth as needed.    . solifenacin (VESICARE) 10 MG tablet Take 1 tablet (10 mg total) by mouth daily as needed (bladder). 90 tablet 3  . triamcinolone (NASACORT) 55 MCG/ACT AERO nasal inhaler Place 2 sprays into the nose as needed.    . triamcinolone ointment (KENALOG) 0.1 % Apply 1 Application topically 3 (three) times daily. As needed for itching 80 g 2   No current facility-administered medications on file prior to visit.    BP 138/72 (BP Location: Left Arm, Patient Position: Sitting, Cuff Size: Normal)   Pulse 80   Temp 98.2 F (36.8 C) (Oral)   Ht 5\' 1"  (1.549 m)   SpO2 97%   BMI 20.56 kg/m chart      Objective:    BP 138/72 (BP Location: Left Arm, Patient Position: Sitting, Cuff Size: Normal)   Pulse 80   Temp 98.2 F (36.8 C) (Oral)  Ht 5\' 1"  (1.549 m)   SpO2 97%   BMI 20.56 kg/m    Physical Exam Vitals reviewed.  Constitutional:      Appearance: Normal appearance. She is normal weight.  HENT:     Right Ear: Tympanic membrane, ear canal and external ear  normal. There is no impacted cerumen.     Left Ear: Tympanic membrane, ear canal and external ear normal.     Nose: Congestion present.     Mouth/Throat:     Mouth: Mucous membranes are moist.  Cardiovascular:     Rate and Rhythm: Normal rate and regular rhythm.  Pulmonary:     Effort: Pulmonary effort is normal. No respiratory distress.     Breath sounds: Normal breath sounds. No wheezing or rales.  Musculoskeletal:        General: Normal range of motion.     Cervical back: Normal range of motion and neck supple.  Skin:    General: Skin is warm and dry.  Neurological:     General: No focal deficit present.     Mental Status: She is alert and oriented to person, place, and time. Mental status is at baseline.  Psychiatric:        Mood and Affect: Mood normal.        Behavior: Behavior normal.   No results found for any visits on 02/24/24.      Assessment & Plan:   Problem List Items Addressed This Visit     Upper respiratory infection - Primary   Other Visit Diagnoses       Acute cough           Meds ordered this encounter  Medications  . methylPREDNISolone (MEDROL DOSEPAK) 4 MG TBPK tablet    Sig: As directed    Dispense:  21 tablet    Refill:  0   Patient admitted to tolerating prednisone well in the past despite hydrocortisone allergy. Call with any questions or concern, if symptoms worsen or persist. Recheck as scheduled and sooner as needled.  No follow-ups on file.  Eulis Foster, FNP

## 2024-02-29 ENCOUNTER — Ambulatory Visit: Attending: Rheumatology | Admitting: Rheumatology

## 2024-02-29 ENCOUNTER — Encounter: Payer: Self-pay | Admitting: Rheumatology

## 2024-02-29 VITALS — BP 128/75 | HR 83 | Resp 14 | Ht 60.0 in | Wt 109.0 lb

## 2024-02-29 DIAGNOSIS — M5030 Other cervical disc degeneration, unspecified cervical region: Secondary | ICD-10-CM | POA: Diagnosis not present

## 2024-02-29 DIAGNOSIS — I10 Essential (primary) hypertension: Secondary | ICD-10-CM | POA: Diagnosis not present

## 2024-02-29 DIAGNOSIS — M62830 Muscle spasm of back: Secondary | ICD-10-CM

## 2024-02-29 DIAGNOSIS — G5703 Lesion of sciatic nerve, bilateral lower limbs: Secondary | ICD-10-CM

## 2024-02-29 DIAGNOSIS — G8929 Other chronic pain: Secondary | ICD-10-CM

## 2024-02-29 DIAGNOSIS — M533 Sacrococcygeal disorders, not elsewhere classified: Secondary | ICD-10-CM | POA: Diagnosis not present

## 2024-02-29 DIAGNOSIS — M19041 Primary osteoarthritis, right hand: Secondary | ICD-10-CM | POA: Diagnosis not present

## 2024-02-29 DIAGNOSIS — M19042 Primary osteoarthritis, left hand: Secondary | ICD-10-CM | POA: Insufficient documentation

## 2024-02-29 DIAGNOSIS — E034 Atrophy of thyroid (acquired): Secondary | ICD-10-CM

## 2024-02-29 DIAGNOSIS — M51369 Other intervertebral disc degeneration, lumbar region without mention of lumbar back pain or lower extremity pain: Secondary | ICD-10-CM

## 2024-02-29 DIAGNOSIS — Z8719 Personal history of other diseases of the digestive system: Secondary | ICD-10-CM | POA: Diagnosis not present

## 2024-02-29 DIAGNOSIS — F4321 Adjustment disorder with depressed mood: Secondary | ICD-10-CM

## 2024-02-29 DIAGNOSIS — M19071 Primary osteoarthritis, right ankle and foot: Secondary | ICD-10-CM

## 2024-02-29 DIAGNOSIS — M810 Age-related osteoporosis without current pathological fracture: Secondary | ICD-10-CM | POA: Diagnosis not present

## 2024-02-29 DIAGNOSIS — R2681 Unsteadiness on feet: Secondary | ICD-10-CM | POA: Diagnosis not present

## 2024-02-29 DIAGNOSIS — Z8639 Personal history of other endocrine, nutritional and metabolic disease: Secondary | ICD-10-CM

## 2024-02-29 DIAGNOSIS — M19072 Primary osteoarthritis, left ankle and foot: Secondary | ICD-10-CM | POA: Insufficient documentation

## 2024-03-01 ENCOUNTER — Other Ambulatory Visit: Payer: Self-pay | Admitting: Internal Medicine

## 2024-03-02 ENCOUNTER — Ambulatory Visit: Payer: Medicare Other

## 2024-03-06 ENCOUNTER — Ambulatory Visit (INDEPENDENT_AMBULATORY_CARE_PROVIDER_SITE_OTHER): Admitting: Sports Medicine

## 2024-03-06 VITALS — BP 134/59 | Ht 60.0 in | Wt 109.0 lb

## 2024-03-06 DIAGNOSIS — M542 Cervicalgia: Secondary | ICD-10-CM

## 2024-03-06 NOTE — Assessment & Plan Note (Signed)
 I think [name] flared her neck and trapezius pain by trying to do the physical therapy exercises using her right arm.  She was not used to this activity and I think it simply was a little bit too much.  I suggested we continue with the Tylenol and the topical arthritis cream since they both seem to help  She will continue with PT but not use any resistance on the right arm Continue with arm swings to break trapezius spasm  I reassured her that she can continue the other activities and simply not overdo anything with the right arm  I will recheck her in 1 month

## 2024-03-06 NOTE — Progress Notes (Signed)
 Chief complaint right sided trapezius pain  The patient has almost complete fusion of her cervical spine.  We have been dealing with significant trapezius spasm particularly on the right side. However with treatment she had improved dramatically She is not using any tramadol She uses Tylenol and a topical arthritis cream and that has been giving her good relief  In addition the physical therapy that she is getting is making her feel like she is stronger and more able to do things  However because she was feeling a good bit better she tried using her right arm on exercise equipment at physical therapy.  After that she got trapezius spasm back on the right side again.  She has stayed out of of that activity until I could check her today. She says the spasm is milder now but still enough that she needs her Tylenol and arthritis cream.  Physical exam Pleasant elderly female in no acute distress BP (!) 134/59   Ht 5' (1.524 m)   Wt 109 lb (49.4 kg)   BMI 21.29 kg/m  Neck range of motion is severely limited with almost no extension Trapezius muscles today show the right to be slightly tighter but no severe spasm like noted in the past Strength testing and neurologic testing of the upper extremities was unremarkable

## 2024-03-12 ENCOUNTER — Other Ambulatory Visit: Payer: Medicare Other

## 2024-03-15 ENCOUNTER — Ambulatory Visit: Admitting: Sports Medicine

## 2024-03-19 ENCOUNTER — Ambulatory Visit (INDEPENDENT_AMBULATORY_CARE_PROVIDER_SITE_OTHER)

## 2024-03-19 ENCOUNTER — Ambulatory Visit (INDEPENDENT_AMBULATORY_CARE_PROVIDER_SITE_OTHER): Admitting: Family Medicine

## 2024-03-19 ENCOUNTER — Ambulatory Visit: Payer: Medicare Other | Admitting: Internal Medicine

## 2024-03-19 ENCOUNTER — Encounter: Payer: Self-pay | Admitting: Family Medicine

## 2024-03-19 VITALS — BP 122/64 | HR 84 | Temp 98.1°F | Ht 60.0 in | Wt 110.0 lb

## 2024-03-19 DIAGNOSIS — H6121 Impacted cerumen, right ear: Secondary | ICD-10-CM

## 2024-03-19 DIAGNOSIS — N3941 Urge incontinence: Secondary | ICD-10-CM

## 2024-03-19 DIAGNOSIS — M25511 Pain in right shoulder: Secondary | ICD-10-CM | POA: Diagnosis not present

## 2024-03-19 DIAGNOSIS — G8929 Other chronic pain: Secondary | ICD-10-CM

## 2024-03-19 DIAGNOSIS — M542 Cervicalgia: Secondary | ICD-10-CM

## 2024-03-19 DIAGNOSIS — J310 Chronic rhinitis: Secondary | ICD-10-CM | POA: Diagnosis not present

## 2024-03-19 DIAGNOSIS — M19011 Primary osteoarthritis, right shoulder: Secondary | ICD-10-CM | POA: Diagnosis not present

## 2024-03-19 DIAGNOSIS — M25711 Osteophyte, right shoulder: Secondary | ICD-10-CM | POA: Diagnosis not present

## 2024-03-19 DIAGNOSIS — M85811 Other specified disorders of bone density and structure, right shoulder: Secondary | ICD-10-CM | POA: Diagnosis not present

## 2024-03-19 MED ORDER — MIRABEGRON ER 25 MG PO TB24: 25.0000 mg | ORAL_TABLET | Freq: Every day | ORAL | 0 refills | Status: DC

## 2024-03-19 MED ORDER — METHOCARBAMOL 500 MG PO TABS: 500.0000 mg | ORAL_TABLET | Freq: Three times a day (TID) | ORAL | 0 refills | Status: DC | PRN

## 2024-03-19 MED ORDER — LEVOCETIRIZINE DIHYDROCHLORIDE 5 MG PO TABS: 5.0000 mg | ORAL_TABLET | Freq: Every evening | ORAL | 2 refills | Status: DC

## 2024-03-19 MED ORDER — AZELASTINE-FLUTICASONE 137-50 MCG/ACT NA SUSP
1.0000 | Freq: Two times a day (BID) | NASAL | 0 refills | Status: DC
Start: 2024-03-19 — End: 2024-04-26

## 2024-03-19 NOTE — Patient Instructions (Addendum)
 Bladder - stop solifenacin  and start Mybetriq.   Nasal drainage - stop Azelastine  and Claritin  and start Dymista  and Xyzal . Continue ipratropium bromide  nasal spray and Singulair  at night.   Neck/Shoulder pain - Robaxin  is a muscle relaxer. Recommend Lidoderm  or Salonpas patches, as well as Biofreeze (this comes in a gel, roll on, or patch). Heating pad.   Purchase Debrox wax removal kit over the counter. Drops (3-4) should be instilled twice daily x3 days, then the ear flushed with warm water on the 4th day.

## 2024-03-19 NOTE — Progress Notes (Signed)
 Assessment & Plan:  1. Chronic rhinitis (Primary) Uncontrolled. Stop Azelastine  and Claritin  and start Dymista  and Xyzal . Continue ipratropium bromide  nasal spray and Singulair  at night. Keep upcoming appointment with allergist.  - Azelastine -Fluticasone  (DYMISTA ) 137-50 MCG/ACT SUSP; Place 1 spray into the nose every 12 (twelve) hours.  Dispense: 23 g; Refill: 0 - levocetirizine (XYZAL ) 5 MG tablet; Take 1 tablet (5 mg total) by mouth every evening.  Dispense: 30 tablet; Refill: 2  2. Chronic neck pain Encouraged Lidoderm  or Salonpas patches, as well as Biofreeze (gel, roll on, or patch). Heating pad.  - methocarbamol  (ROBAXIN ) 500 MG tablet; Take 1 tablet (500 mg total) by mouth every 8 (eight) hours as needed.  Dispense: 30 tablet; Refill: 0  3. Urge incontinence Uncontrolled on current dosage and experiencing side effects. Stop Vesicare  and start Myrbetriq .  - mirabegron  ER (MYRBETRIQ ) 25 MG TB24 tablet; Take 1 tablet (25 mg total) by mouth daily.  Dispense: 90 tablet; Refill: 0  4. Impacted cerumen of right ear Encouraged to purchase Debrox wax removal kit over the counter. Drops (3-4) should be instilled twice daily x3 days, then the ear flushed with warm water on the 4th day.     Follow up plan: Return for as scheduled with PCP.  Hershel Los, MSN, APRN, FNP-C  Subjective:  HPI: [name] is a 88 y.o. female presenting on 03/19/2024 for nasal drainage (Mucus drains to throat x several weeks - clear drainage (using netty pot) ) and Neck Pain (Fall yesterday - cont neck and right shoulder pain - hurts a little more since fall. )  Patient reports clear drainage for several weeks. She has been using a netty pot regularly. She is taking Claritin  and Singulair , as well as using Azelastine  and Atrovent  daily which she has been taking for years. She has been seeing a pulmonary allergist who recently passed away and has an upcoming appointment to reestablish with another allergies.    Additionally, she had a fall yesterday and is experiencing worsening neck and shoulder pain. She was eating outside at a restaurant and was sitting where the patio meets the grass; when she stood up, her heel went into the grass causing her to fall backwards. She has been taking Tylenol  and applying arthritis cream.  Lastly, patient is concerned about her dry mouth and if any of her medications are causing this. She is taking Vesicare  due to urinary urge incontinence. She reports she could use a higher dosage.     ROS: Negative unless specifically indicated above in HPI.   Relevant past medical history reviewed and updated as indicated.   Allergies and medications reviewed and updated.   Current Outpatient Medications:    acetaminophen  (TYLENOL ) 500 MG tablet, Take 1,000 mg by mouth every 6 (six) hours as needed for mild pain., Disp: , Rfl:    albuterol  (VENTOLIN  HFA) 108 (90 Base) MCG/ACT inhaler, Take 2 puffs every 4-6 hours as needed, Disp: , Rfl:    amLODipine  (NORVASC ) 2.5 MG tablet, Take 2.5 mg by mouth as needed., Disp: , Rfl:    Ascorbic Acid (VITAMIN C ) 100 MG tablet, Take 1 tablet (100 mg total) by mouth daily., Disp: 100 tablet, Rfl: 1   Azelastine  HCl 0.15 % SOLN, Place into both nostrils. Place into both nostrils., Disp: , Rfl:    Calcium  Carbonate-Vitamin D  (CALCIUM  CARBONATE W/VITAMIN D  PO), Take 1 tablet by mouth daily., Disp: , Rfl:    cyanocobalamin  (VITAMIN B12) 1000 MCG tablet, Take 1 tablet (  1,000 mcg total) by mouth daily., Disp: 100 tablet, Rfl: 3   desonide  (DESOWEN ) 0.05 % cream, Apply 1 application topically as needed., Disp: , Rfl:    famotidine  (PEPCID ) 40 MG tablet, Take 40 mg by mouth as needed for heartburn or indigestion., Disp: , Rfl:    hydrocortisone  (ANUSOL -HC) 2.5 % rectal cream, Place 1 Application rectally as needed for hemorrhoids or anal itching., Disp: , Rfl:    ipratropium (ATROVENT ) 0.06 % nasal spray, Place 2 sprays into both nostrils 2 (two)  times daily as needed for rhinitis., Disp: , Rfl:    levocetirizine (XYZAL ) 5 MG tablet, Take 5 mg by mouth every evening., Disp: , Rfl:    levothyroxine  (SYNTHROID ) 50 MCG tablet, TAKE ONE TABLET BY MOUTH DAILY BEFORE BREAKFAST, Disp: 30 tablet, Rfl: 5   loratadine  (CLARITIN ) 10 MG tablet, Take 1 tablet (10 mg total) by mouth daily., Disp: 100 tablet, Rfl: 3   LORazepam  (ATIVAN ) 1 MG tablet, Take 2-3 tablets at bedtime and 1-2 tablets in the daytime as needed., Disp: 150 tablet, Rfl: 2   memantine  (NAMENDA  XR) 14 MG CP24 24 hr capsule, Take 1 capsule (14 mg total) by mouth at bedtime., Disp: 90 capsule, Rfl: 11   polyethylene glycol powder (GLYCOLAX /MIRALAX ) 17 GM/SCOOP powder, Take 17 g by mouth 2 (two) times daily as needed for moderate constipation., Disp: 500 g, Rfl: 3   psyllium (METAMUCIL) 58.6 % packet, Take 1 packet by mouth as needed., Disp: , Rfl:    solifenacin  (VESICARE ) 10 MG tablet, Take 1 tablet (10 mg total) by mouth daily as needed (bladder)., Disp: 90 tablet, Rfl: 3   UNABLE TO FIND, Med Name: CBD oil - under tongue, Disp: , Rfl:    EPINEPHrine  (EPIPEN  2-PAK) 0.3 mg/0.3 mL IJ SOAJ injection, Inject 0.3 mLs (0.3 mg total) into the muscle as needed. (Patient not taking: Reported on 02/29/2024), Disp: 1 Device, Rfl: 1   erythromycin  ophthalmic ointment, Place 1 Application into both eyes as needed. (Patient not taking: Reported on 03/19/2024), Disp: , Rfl:    MegaRed Omega-3 Krill Oil 500 MG CAPS, Take 1 capsule by mouth every morning. (Patient not taking: Reported on 03/19/2024), Disp: 100 capsule, Rfl: 3   montelukast  (SINGULAIR ) 10 MG tablet, Take 1 tablet (10 mg total) by mouth at bedtime., Disp: 90 tablet, Rfl: 1   pantoprazole  (PROTONIX ) 40 MG tablet, TAKE ONE TABLET BY MOUTH TWICE DAILY BEFORE MEALS (Patient not taking: Reported on 03/19/2024), Disp: 60 tablet, Rfl: 5   triamcinolone  ointment (KENALOG ) 0.1 %, Apply 1 Application topically 3 (three) times daily. As needed for itching,  Disp: 80 g, Rfl: 2  Allergies  Allergen Reactions   Bee Venom Itching, Swelling and Rash    Itching, swelling and rash with bee stings, patient has epi pen   Bacitracin -Polymyxin B Other (See Comments)   Clarithromycin Other (See Comments)   Diclofenac      ??rash from Voltaren  gel   Doxycycline      ??   Neosporin [Neomycin -Bacitracin  Zn-Polymyx]    Hydrocortisone  Rash    Objective:   BP 122/64   Pulse 84   Temp 98.1 F (36.7 C)   Ht 5' (1.524 m)   Wt 110 lb (49.9 kg)   SpO2 95%   BMI 21.48 kg/m    Physical Exam Vitals reviewed.  Constitutional:      General: She is not in acute distress.    Appearance: Normal appearance. She is not ill-appearing, toxic-appearing or diaphoretic.  HENT:  Head: Normocephalic and atraumatic.     Right Ear: Ear canal and external ear normal. There is impacted cerumen.     Left Ear: Tympanic membrane, ear canal and external ear normal. There is no impacted cerumen.     Nose: Rhinorrhea (clear) present. No congestion.     Mouth/Throat:     Mouth: Mucous membranes are moist.     Pharynx: Oropharynx is clear. No oropharyngeal exudate or posterior oropharyngeal erythema.  Eyes:     General: No scleral icterus.       Right eye: No discharge.        Left eye: No discharge.     Conjunctiva/sclera: Conjunctivae normal.  Cardiovascular:     Rate and Rhythm: Normal rate and regular rhythm.     Heart sounds: Normal heart sounds. No murmur heard.    No friction rub. No gallop.  Pulmonary:     Effort: Pulmonary effort is normal. No respiratory distress.     Breath sounds: Normal breath sounds. No stridor. No wheezing, rhonchi or rales.  Musculoskeletal:        General: Normal range of motion.     Cervical back: Normal range of motion.  Skin:    General: Skin is warm and dry.     Capillary Refill: Capillary refill takes less than 2 seconds.  Neurological:     General: No focal deficit present.     Mental Status: She is alert and oriented  to person, place, and time. Mental status is at baseline.  Psychiatric:        Mood and Affect: Mood normal.        Behavior: Behavior normal.        Thought Content: Thought content normal.        Judgment: Judgment normal.

## 2024-03-20 ENCOUNTER — Other Ambulatory Visit: Payer: Self-pay | Admitting: Allergy and Immunology

## 2024-03-20 ENCOUNTER — Encounter: Payer: Self-pay | Admitting: Internal Medicine

## 2024-03-20 ENCOUNTER — Ambulatory Visit
Admission: RE | Admit: 2024-03-20 | Discharge: 2024-03-20 | Disposition: A | Discharge status: Home / Self Care | Source: Ambulatory Visit | Attending: Allergy and Immunology | Admitting: Allergy and Immunology

## 2024-03-20 DIAGNOSIS — J4520 Mild intermittent asthma, uncomplicated: Secondary | ICD-10-CM | POA: Diagnosis not present

## 2024-03-20 DIAGNOSIS — J3089 Other allergic rhinitis: Secondary | ICD-10-CM | POA: Diagnosis not present

## 2024-03-20 DIAGNOSIS — R052 Subacute cough: Secondary | ICD-10-CM

## 2024-03-20 DIAGNOSIS — J301 Allergic rhinitis due to pollen: Secondary | ICD-10-CM | POA: Diagnosis not present

## 2024-03-20 DIAGNOSIS — J3081 Allergic rhinitis due to animal (cat) (dog) hair and dander: Secondary | ICD-10-CM | POA: Diagnosis not present

## 2024-03-20 DIAGNOSIS — R059 Cough, unspecified: Secondary | ICD-10-CM | POA: Diagnosis not present

## 2024-03-22 ENCOUNTER — Encounter: Payer: Self-pay | Admitting: Physician Assistant

## 2024-03-25 ENCOUNTER — Emergency Department (HOSPITAL_BASED_OUTPATIENT_CLINIC_OR_DEPARTMENT_OTHER): Admission: EM | Admit: 2024-03-25 | Discharge: 2024-03-25 | Disposition: A | Discharge status: Home / Self Care

## 2024-03-25 ENCOUNTER — Emergency Department (HOSPITAL_BASED_OUTPATIENT_CLINIC_OR_DEPARTMENT_OTHER): Admitting: Radiology

## 2024-03-25 ENCOUNTER — Other Ambulatory Visit: Payer: Self-pay

## 2024-03-25 DIAGNOSIS — R051 Acute cough: Secondary | ICD-10-CM

## 2024-03-25 DIAGNOSIS — R0789 Other chest pain: Secondary | ICD-10-CM | POA: Diagnosis not present

## 2024-03-25 DIAGNOSIS — R059 Cough, unspecified: Secondary | ICD-10-CM | POA: Diagnosis not present

## 2024-03-25 LAB — RESP PANEL BY RT-PCR (RSV, FLU A&B, COVID)  RVPGX2
Influenza A by PCR: NEGATIVE
Influenza B by PCR: NEGATIVE
Resp Syncytial Virus by PCR: NEGATIVE
SARS Coronavirus 2 by RT PCR: NEGATIVE

## 2024-03-25 MED ORDER — BENZONATATE 100 MG PO CAPS: 100.0000 mg | ORAL_CAPSULE | Freq: Three times a day (TID) | ORAL | 0 refills | Status: DC | PRN

## 2024-03-25 NOTE — Discharge Instructions (Addendum)
 Your Xray did not show a pneumonia. You may take over-the-counter Tylenol  alternating with ibuprofen for pain.  Please follow-up with your primary doctor.  We are prescribing you medication to help with your cough you may take it as needed.  Please return if develop fevers, chills, worsening shortness of breath, chest pain.  You may also return to follow-up any new or worsening symptoms that are concerning to you.

## 2024-03-25 NOTE — ED Triage Notes (Signed)
 Pt reports cough, congestion + chest pain when coughing for 4-5 days, worsening as the days pass and with yellow phlegm.  Pt denies fever/n/v or other sx.  Pt AAOx4, NAD in triage.

## 2024-03-25 NOTE — ED Provider Notes (Signed)
 [place] EMERGENCY DEPARTMENT AT [facility] Provider Note   CSN: [id number] Arrival date & time: 03/25/24  1759     History  Chief Complaint  Patient presents with   Cough    [name] is a 88 y.o. female.  88 year old presented with cough for the past 4 days.  Reports some discomfort with coughing.  Is bringing up yellow phlegm.  No fevers, chills nausea vomiting diarrhea.  No shortness of breath.  No dyspnea on exertion.   Cough      Home Medications Prior to Admission medications   Medication Sig Start Date End Date Taking? Authorizing Provider  acetaminophen  (TYLENOL ) 500 MG tablet Take 1,000 mg by mouth every 6 (six) hours as needed for mild pain.    [provider]  albuterol  (VENTOLIN  HFA) 108 (90 Base) MCG/ACT inhaler Take 2 puffs every 4-6 hours as needed 10/26/21   [provider]  amLODipine  (NORVASC ) 2.5 MG tablet Take 2.5 mg by mouth as needed. 08/20/23   [provider]  Ascorbic Acid (VITAMIN C ) 100 MG tablet Take 1 tablet (100 mg total) by mouth daily. 01/09/24   Plotnikov, Aleksei V, MD  Azelastine -Fluticasone  (DYMISTA ) 137-50 MCG/ACT SUSP Place 1 spray into the nose every 12 (twelve) hours. 03/19/24   Zorita Hiss, FNP  Calcium  Carbonate-Vitamin D  (CALCIUM  CARBONATE W/VITAMIN D  PO) Take 1 tablet by mouth daily.    [provider]  cyanocobalamin  (VITAMIN B12) 1000 MCG tablet Take 1 tablet (1,000 mcg total) by mouth daily. 08/25/23   Plotnikov, Oakley Bellman, MD  desonide  (DESOWEN ) 0.05 % cream Apply 1 application topically as needed.    [provider]  EPINEPHrine  (EPIPEN  2-PAK) 0.3 mg/0.3 mL IJ SOAJ injection Inject 0.3 mLs (0.3 mg total) into the muscle as needed. Patient not taking: Reported on 02/29/2024 12/20/18   Plotnikov, Oakley Bellman, MD  erythromycin  ophthalmic ointment Place 1 Application into both eyes as needed. Patient not taking: Reported on 03/19/2024    [provider]  famotidine   (PEPCID ) 40 MG tablet Take 40 mg by mouth as needed for heartburn or indigestion.    [provider]  hydrocortisone  (ANUSOL -HC) 2.5 % rectal cream Place 1 Application rectally as needed for hemorrhoids or anal itching.    [provider]  ipratropium (ATROVENT ) 0.06 % nasal spray Place 2 sprays into both nostrils 2 (two) times daily as needed for rhinitis. 02/06/24   [provider]  levocetirizine (XYZAL ) 5 MG tablet Take 1 tablet (5 mg total) by mouth every evening. 03/19/24   Zorita Hiss, FNP  levothyroxine  (SYNTHROID ) 50 MCG tablet TAKE ONE TABLET BY MOUTH DAILY BEFORE BREAKFAST 03/02/24   Colene Dauphin, MD  LORazepam  (ATIVAN ) 1 MG tablet Take 2-3 tablets at bedtime and 1-2 tablets in the daytime as needed. 01/28/24   Plotnikov, Aleksei V, MD  MegaRed Omega-3 Krill Oil 500 MG CAPS Take 1 capsule by mouth every morning. Patient not taking: Reported on 03/19/2024 01/09/24   Plotnikov, Oakley Bellman, MD  memantine  (NAMENDA  XR) 14 MG CP24 24 hr capsule Take 1 capsule (14 mg total) by mouth at bedtime. 11/29/23   Wertman, Sara E, PA-C  methocarbamol  (ROBAXIN ) 500 MG tablet Take 1 tablet (500 mg total) by mouth every 8 (eight) hours as needed. 03/19/24   Zorita Hiss, FNP  mirabegron  ER (MYRBETRIQ ) 25 MG TB24 tablet Take 1 tablet (25 mg total) by mouth daily. 03/19/24   Zorita Hiss, FNP  montelukast  (SINGULAIR ) 10  MG tablet Take 1 tablet (10 mg total) by mouth at bedtime. 03/30/21   Plotnikov, Aleksei V, MD  pantoprazole  (PROTONIX ) 40 MG tablet TAKE ONE TABLET BY MOUTH TWICE DAILY BEFORE MEALS Patient not taking: Reported on 03/19/2024 01/31/24   Plotnikov, Aleksei V, MD  polyethylene glycol powder (GLYCOLAX /MIRALAX ) 17 GM/SCOOP powder Take 17 g by mouth 2 (two) times daily as needed for moderate constipation. 03/29/22   Plotnikov, Aleksei V, MD  psyllium (METAMUCIL) 58.6 % packet Take 1 packet by mouth as needed.    [provider]  triamcinolone  ointment (KENALOG ) 0.1 %  Apply 1 Application topically 3 (three) times daily. As needed for itching 12/21/23   Plotnikov, Oakley Bellman, MD  UNABLE TO FIND Med Name: CBD oil - under tongue    [provider]      Allergies    Bee venom, Bacitracin -polymyxin b, Clarithromycin, Diclofenac , Doxycycline , Neosporin [neomycin -bacitracin  zn-polymyx], and Hydrocortisone     Review of Systems   Review of Systems  Respiratory:  Positive for cough.     Physical Exam Updated Vital Signs BP (!) 162/59 (BP Location: Right Arm)   Pulse 90   Temp 98 F (36.7 C)   Resp 20   SpO2 98%  Physical Exam Vitals and nursing note reviewed.  Constitutional:      General: She is not in acute distress.    Appearance: She is not toxic-appearing.  HENT:     Head: Normocephalic.     Nose: Nose normal.  Eyes:     Conjunctiva/sclera: Conjunctivae normal.  Cardiovascular:     Rate and Rhythm: Normal rate and regular rhythm.  Pulmonary:     Effort: Pulmonary effort is normal.     Comments: Some adventitious lung sounds in the left lower lung Abdominal:     General: Abdomen is flat. There is no distension.  Skin:    General: Skin is warm and dry.     Capillary Refill: Capillary refill takes less than 2 seconds.  Neurological:     Mental Status: She is alert and oriented to person, place, and time.  Psychiatric:        Mood and Affect: Mood normal.        Behavior: Behavior normal.     ED Results / Procedures / Treatments   Labs (all labs ordered are listed, but only abnormal results are displayed) Labs Reviewed  RESP PANEL BY RT-PCR (RSV, FLU A&B, COVID)  [id number]    EKG None  Radiology No results found.  Procedures Procedures    Medications Ordered in ED Medications - No data to display  ED Course/ Medical Decision Making/ A&P                                 Medical Decision Making This is a well-appearing 88 year old female presents emergency department with URI symptoms for the past 4 days with  congestion, productive cough.  Afebrile nontachycardic, maintaining oxygen saturation on room air.  Well-appearing does not appear to be in distress.  Lungs largely clear, some minor adventitious sounds in her left lung base.  X-ray independently reviewed by myself with possible infiltrate on the left, but no pneumothorax.  Awaiting radiology final read.  Will discharge with Tessalon Perles plus or minus antibiotics.  See ED course for final MDM  Amount and/or Complexity of Data Reviewed Radiology: ordered.          Final Clinical Impression(s) /  ED Diagnoses Final diagnoses:  None    Rx / DC Orders ED Discharge Orders     None         Rolinda Climes, DO 03/25/24 2312

## 2024-03-27 DIAGNOSIS — N3281 Overactive bladder: Secondary | ICD-10-CM | POA: Diagnosis not present

## 2024-03-27 DIAGNOSIS — R399 Unspecified symptoms and signs involving the genitourinary system: Secondary | ICD-10-CM | POA: Diagnosis not present

## 2024-03-27 DIAGNOSIS — R350 Frequency of micturition: Secondary | ICD-10-CM | POA: Diagnosis not present

## 2024-03-27 DIAGNOSIS — R3915 Urgency of urination: Secondary | ICD-10-CM | POA: Diagnosis not present

## 2024-03-29 ENCOUNTER — Inpatient Hospital Stay: Admitting: Internal Medicine

## 2024-04-02 ENCOUNTER — Ambulatory Visit: Payer: Self-pay

## 2024-04-02 ENCOUNTER — Ambulatory Visit (INDEPENDENT_AMBULATORY_CARE_PROVIDER_SITE_OTHER): Admitting: Internal Medicine

## 2024-04-02 ENCOUNTER — Encounter: Payer: Self-pay | Admitting: Internal Medicine

## 2024-04-02 VITALS — BP 134/76 | HR 90 | Temp 97.9°F | Ht 60.0 in | Wt 108.2 lb

## 2024-04-02 DIAGNOSIS — J069 Acute upper respiratory infection, unspecified: Secondary | ICD-10-CM | POA: Diagnosis not present

## 2024-04-02 DIAGNOSIS — E034 Atrophy of thyroid (acquired): Secondary | ICD-10-CM | POA: Diagnosis not present

## 2024-04-02 DIAGNOSIS — R410 Disorientation, unspecified: Secondary | ICD-10-CM | POA: Diagnosis not present

## 2024-04-02 DIAGNOSIS — R269 Unspecified abnormalities of gait and mobility: Secondary | ICD-10-CM

## 2024-04-02 MED ORDER — CEFUROXIME AXETIL 250 MG PO TABS: 250.0000 mg | ORAL_TABLET | Freq: Two times a day (BID) | ORAL | 0 refills | Status: AC

## 2024-04-02 NOTE — Assessment & Plan Note (Addendum)
 Chronic On Levothroid brand Check TSH 2-3/year

## 2024-04-02 NOTE — Progress Notes (Signed)
 Subjective:  Patient ID: [name], female    DOB: 05-19-1936  Age: 88 y.o. MRN: [id number]  CC: URI (Chest congestion Pain when taking deep breaths, productive cough for a week and a half. Patient does stay in a living facility with others having similar symptoms. Patient also notes some lightheadedness but believes this is from their medication. Some memory issues within the last couple of days (long term, notes she had forgotten her husband hand passed during this period).)   HPI KATYRA MECKES presents for URI (Chest congestion Pain when taking deep breaths, productive cough for a week and a half. Patient does stay in a living facility with others having similar symptoms. Patient also notes some lightheadedness but believes this is from their medication. Some memory issues within the last couple of days (long term, notes she had forgotten her husband had passed during this period).   Outpatient Medications Prior to Visit  Medication Sig Dispense Refill   acetaminophen  (TYLENOL ) 500 MG tablet Take 1,000 mg by mouth every 6 (six) hours as needed for mild pain.     albuterol  (VENTOLIN  HFA) 108 (90 Base) MCG/ACT inhaler Take 2 puffs every 4-6 hours as needed     amLODipine  (NORVASC ) 2.5 MG tablet Take 2.5 mg by mouth as needed.     Ascorbic Acid (VITAMIN C ) 100 MG tablet Take 1 tablet (100 mg total) by mouth daily. 100 tablet 1   Azelastine -Fluticasone  (DYMISTA ) 137-50 MCG/ACT SUSP Place 1 spray into the nose every 12 (twelve) hours. 23 g 0   benzonatate  (TESSALON ) 100 MG capsule Take 1 capsule (100 mg total) by mouth 3 (three) times daily as needed for cough. 14 capsule 0   Calcium  Carbonate-Vitamin D  (CALCIUM  CARBONATE W/VITAMIN D  PO) Take 1 tablet by mouth daily.     cyanocobalamin  (VITAMIN B12) 1000 MCG tablet Take 1 tablet (1,000 mcg total) by mouth daily. 100 tablet 3   desonide  (DESOWEN ) 0.05 % cream Apply 1 application topically as needed.     famotidine  (PEPCID ) 40 MG tablet Take 40  mg by mouth as needed for heartburn or indigestion.     hydrocortisone  (ANUSOL -HC) 2.5 % rectal cream Place 1 Application rectally as needed for hemorrhoids or anal itching.     ipratropium (ATROVENT ) 0.06 % nasal spray Place 2 sprays into both nostrils 2 (two) times daily as needed for rhinitis.     levocetirizine (XYZAL ) 5 MG tablet Take 1 tablet (5 mg total) by mouth every evening. 30 tablet 2   levothyroxine  (SYNTHROID ) 50 MCG tablet TAKE ONE TABLET BY MOUTH DAILY BEFORE BREAKFAST 30 tablet 5   LORazepam  (ATIVAN ) 1 MG tablet Take 2-3 tablets at bedtime and 1-2 tablets in the daytime as needed. 150 tablet 2   MegaRed Omega-3 Krill Oil 500 MG CAPS Take 1 capsule by mouth every morning. 100 capsule 3   memantine  (NAMENDA  XR) 14 MG CP24 24 hr capsule Take 1 capsule (14 mg total) by mouth at bedtime. 90 capsule 11   mirabegron  ER (MYRBETRIQ ) 25 MG TB24 tablet Take 1 tablet (25 mg total) by mouth daily. 90 tablet 0   montelukast  (SINGULAIR ) 10 MG tablet Take 1 tablet (10 mg total) by mouth at bedtime. 90 tablet 1   polyethylene glycol powder (GLYCOLAX /MIRALAX ) 17 GM/SCOOP powder Take 17 g by mouth 2 (two) times daily as needed for moderate constipation. 500 g 3   psyllium (METAMUCIL) 58.6 % packet Take 1 packet by mouth as needed.  triamcinolone  ointment (KENALOG ) 0.1 % Apply 1 Application topically 3 (three) times daily. As needed for itching 80 g 2   UNABLE TO FIND Med Name: CBD oil - under tongue     methocarbamol  (ROBAXIN ) 500 MG tablet Take 1 tablet (500 mg total) by mouth every 8 (eight) hours as needed. 30 tablet 0   EPINEPHrine  (EPIPEN  2-PAK) 0.3 mg/0.3 mL IJ SOAJ injection Inject 0.3 mLs (0.3 mg total) into the muscle as needed. (Patient not taking: Reported on 02/29/2024) 1 Device 1   erythromycin  ophthalmic ointment Place 1 Application into both eyes as needed. (Patient not taking: Reported on 04/02/2024)     pantoprazole  (PROTONIX ) 40 MG tablet TAKE ONE TABLET BY MOUTH TWICE DAILY BEFORE  MEALS (Patient not taking: Reported on 04/02/2024) 60 tablet 5   No facility-administered medications prior to visit.    ROS: Review of Systems  Constitutional:  Negative for activity change, appetite change, chills, fatigue and unexpected weight change.  HENT:  Positive for congestion and sinus pressure. Negative for mouth sores.   Eyes:  Negative for visual disturbance.  Respiratory:  Positive for cough. Negative for chest tightness.   Cardiovascular:  Negative for chest pain.  Gastrointestinal:  Negative for abdominal pain and nausea.  Genitourinary:  Negative for difficulty urinating, frequency and vaginal pain.  Musculoskeletal:  Negative for back pain and gait problem.  Skin:  Negative for pallor and rash.  Neurological:  Negative for dizziness, tremors, weakness, numbness and headaches.  Psychiatric/Behavioral:  Negative for confusion and sleep disturbance.     Objective:  BP 134/76   Pulse 90   Temp 97.9 F (36.6 C)   Ht 5' (1.524 m)   Wt 108 lb 3.2 oz (49.1 kg)   SpO2 95%   BMI 21.13 kg/m   BP Readings from Last 3 Encounters:  04/02/24 134/76  03/25/24 (!) 158/61  03/19/24 122/64    Wt Readings from Last 3 Encounters:  04/02/24 108 lb 3.2 oz (49.1 kg)  03/19/24 110 lb (49.9 kg)  03/06/24 109 lb (49.4 kg)    Physical Exam Constitutional:      General: She is not in acute distress.    Appearance: Normal appearance. She is well-developed.  HENT:     Head: Normocephalic.     Right Ear: External ear normal.     Left Ear: External ear normal.     Nose: Nose normal.  Eyes:     General:        Right eye: No discharge.        Left eye: No discharge.     Conjunctiva/sclera: Conjunctivae normal.     Pupils: Pupils are equal, round, and reactive to light.  Neck:     Thyroid : No thyromegaly.     Vascular: No JVD.     Trachea: No tracheal deviation.  Cardiovascular:     Rate and Rhythm: Normal rate and regular rhythm.     Heart sounds: Normal heart sounds.   Pulmonary:     Effort: No respiratory distress.     Breath sounds: No stridor. No wheezing.  Abdominal:     General: Bowel sounds are normal. There is no distension.     Palpations: Abdomen is soft. There is no mass.     Tenderness: There is no abdominal tenderness. There is no guarding or rebound.  Musculoskeletal:        General: No tenderness.     Cervical back: Normal range of motion and neck supple. No rigidity.  Lymphadenopathy:     Cervical: No cervical adenopathy.  Skin:    Findings: No erythema or rash.  Neurological:     Mental Status: Mental status is at baseline.     Cranial Nerves: No cranial nerve deficit.     Motor: No abnormal muscle tone.     Coordination: Coordination normal.     Gait: Gait abnormal.     Deep Tendon Reflexes: Reflexes normal.  Psychiatric:        Behavior: Behavior normal.        Thought Content: Thought content normal.        Judgment: Judgment normal.   Using a walker  Lab Results  Component Value Date   WBC 8.9 02/15/2024   HGB 13.3 02/15/2024   HCT 39.2 02/15/2024   PLT 282.0 02/15/2024   GLUCOSE 109 (H) 02/15/2024   CHOL 274 (H) 02/15/2024   TRIG 181.0 (H) 02/15/2024   HDL 72.00 02/15/2024   LDLDIRECT 98.0 08/13/2021   LDLCALC 166 (H) 02/15/2024   ALT 45 (H) 02/15/2024   AST 23 02/15/2024   NA 129 (L) 02/15/2024   K 3.9 02/15/2024   CL 95 (L) 02/15/2024   CREATININE 0.52 02/15/2024   BUN 12 02/15/2024   CO2 26 02/15/2024   TSH 1.96 02/15/2024   HGBA1C 5.5 06/13/2020    DG Chest 2 View Result Date: 03/25/2024 CLINICAL DATA:  Cough EXAM: CHEST - 2 VIEW COMPARISON:  03/20/2024 FINDINGS: Lungs are well expanded, symmetric, and clear. No pneumothorax or pleural effusion. Cardiac size within normal limits. Pulmonary vascularity is normal. Osseous structures are age-appropriate. No acute bone abnormality. IMPRESSION: No active cardiopulmonary disease. Electronically Signed   By: Worthy Heads M.D.   On: 03/25/2024 20:45     Assessment & Plan:   Problem List Items Addressed This Visit     Hypothyroidism   Chronic On Levothroid brand Check TSH 2-3/year      Upper respiratory infection   Ceftin  po      Relevant Medications   cefUROXime  (CEFTIN ) 250 MG tablet   Gait disorder   Use a walker      Episode of confusion - Primary   F/u w/S Wertman D/c Robaxin          Meds ordered this encounter  Medications   cefUROXime  (CEFTIN ) 250 MG tablet    Sig: Take 1 tablet (250 mg total) by mouth 2 (two) times daily with a meal for 10 days.    Dispense:  20 tablet    Refill:  0      Follow-up: Return in about 6 weeks (around 05/14/2024) for a follow-up visit.  Anitra Barn, MD

## 2024-04-02 NOTE — Assessment & Plan Note (Addendum)
Ceftin po 

## 2024-04-02 NOTE — Assessment & Plan Note (Addendum)
 F/u w/S Alane Allen D/c Robaxin 

## 2024-04-02 NOTE — Assessment & Plan Note (Signed)
Use a walker 

## 2024-04-02 NOTE — Telephone Encounter (Signed)
 Copied from CRM [id number]. Topic: Clinical - Red Word Triage >> Apr 02, 2024  8:37 AM Alysia Jumbo S wrote: [facility] that prompted transfer to Nurse Triage: Increased cough w/ mucous, body ached   Chief Complaint: Increased productive cough Symptoms: Cough with yellowish mucous,  Frequency: x over a week and feeling worse Pertinent Negatives: Patient denies fever, difficulty breathing, abdominal pain, dizziness, vomiting Disposition: [] ED /[] Urgent Care (no appt availability in office) / [] Appointment(In office/virtual)/ []  [place] [facility]/ [] Home Care/ [x] Refused Recommended Disposition /[]  Mobile Bus/ []  Follow-up with PCP Additional Notes: Patient called and advised that she is feeling worse from a recent cough. Patient denies fever, difficulty breathing, abdominal pain, dizziness, vomiting.  Due to patient stating that she has chest pressure worse with coughing--it is recommended at this time that the patient goes to the Emergency Room. Patient states that she is not going to the Emergency Room and that she had just been to the Emergency Room on 03/25/2024.  Patient states that she only wants to see her PCP at this time. This RN called the [facility] and advised them that the patient refused to go back to the Emergency Room at this time and wants to see her PCP.  Patient is connected with the CAL at her PCP office and they further assisted her with getting her in to see her PCP. She is still advised at this time that if she worsens to seek immediate medical attention at the Emergency Room. Patient verbalized understanding.   Reason for Disposition  Taking a deep breath makes pain worse  Answer Assessment - Initial Assessment Questions 1. ONSET: "When did the cough begin?"      A week or so ago 2. SEVERITY: "How bad is the cough today?"      Increased mucous 3. SPUTUM: "Describe the color of your sputum" (none, dry cough; clear, white, yellow, green)     Sometimes light yellow and  sometimes clear 4. HEMOPTYSIS: "Are you coughing up any blood?" If so ask: "How much?" (flecks, streaks, tablespoons, etc.)     No 5. DIFFICULTY BREATHING: "Are you having difficulty breathing?" If Yes, ask: "How bad is it?" (e.g., mild, moderate, severe)    - MILD: No SOB at rest, mild SOB with walking, speaks normally in sentences, can lie down, no retractions, pulse < 100.    - MODERATE: SOB at rest, SOB with minimal exertion and prefers to sit, cannot lie down flat, speaks in phrases, mild retractions, audible wheezing, pulse 100-120.    - SEVERE: Very SOB at rest, speaks in single words, struggling to breathe, sitting hunched forward, retractions, pulse > 120      Sometimes at night--but nasal spray helps 6. FEVER: "Do you have a fever?" If Yes, ask: "What is your temperature, how was it measured, and when did it start?"     No 7. CARDIAC HISTORY: "Do you have any history of heart disease?" (e.g., heart attack, congestive heart failure)      No 8. LUNG HISTORY: "Do you have any history of lung disease?"  (e.g., pulmonary embolus, asthma, emphysema)     No 9. PE RISK FACTORS: "Do you have a history of blood clots?" (or: recent major surgery, recent prolonged travel, bedridden)     No 10. OTHER SYMPTOMS: "Do you have any other symptoms?" (e.g., runny nose, wheezing, chest pain)       Body aches 12. TRAVEL: "Have you traveled out of the country in the last month?" (e.g.,  travel history, exposures)       ---  Answer Assessment - Initial Assessment Questions 1. LOCATION: "Where does it hurt?"       "Feels heavy" 2. RADIATION: "Does the pain go anywhere else?" (e.g., into neck, jaw, arms, back)     ---- 3. ONSET: "When did the chest pain begin?" (Minutes, hours or days)      All along--over a week  felt a little better then felt worse 4. PATTERN: "Does the pain come and go, or has it been constant since it started?"  "Does it get worse with exertion?"       felt a little better then felt  worse 5. DURATION: "How long does it last" (e.g., seconds, minutes, hours)     Feels heavy the entire and coughs make it worse 6. SEVERITY: "How bad is the pain?"  (e.g., Scale 1-10; mild, moderate, or severe)    - MILD (1-3): doesn't interfere with normal activities     - MODERATE (4-7): interferes with normal activities or awakens from sleep    - SEVERE (8-10): excruciating pain, unable to do any normal activities       8 7. CARDIAC RISK FACTORS: "Do you have any history of heart problems or risk factors for heart disease?" (e.g., angina, prior heart attack; diabetes, high blood pressure, high cholesterol, smoker, or strong family history of heart disease)     No 8. PULMONARY RISK FACTORS: "Do you have any history of lung disease?"  (e.g., blood clots in lung, asthma, emphysema, birth control pills)     No 9. CAUSE: "What do you think is causing the chest pain?"     ---- 10. OTHER SYMPTOMS: "Do you have any other symptoms?" (e.g., dizziness, nausea, vomiting, sweating, fever, difficulty breathing, cough)       nausea  Protocols used: Cough - Acute Productive-A-AH, Chest Pain-A-AH

## 2024-04-04 ENCOUNTER — Telehealth: Payer: Self-pay | Admitting: Internal Medicine

## 2024-04-04 NOTE — Telephone Encounter (Unsigned)
 Copied from CRM [id number]. Topic: Clinical - Medication Question >> Apr 04, 2024 11:49 AM Adonis Hoot wrote: Reason for CRM: Patient is requesting a phone call with a question regarding a few of her medications.

## 2024-04-05 ENCOUNTER — Ambulatory Visit: Payer: Self-pay

## 2024-04-05 NOTE — Telephone Encounter (Signed)
 Patient called  and she reports she is feeling better and her confusion is better- she is confirming her appointment tomorrow and states she will be at office for that appointment.

## 2024-04-05 NOTE — Telephone Encounter (Signed)
 Copied from CRM [id number]. Topic: Clinical - Red Word Triage >> Apr 05, 2024  9:56 AM Magdalene School wrote: Red Word that prompted transfer to Nurse Triage: Confusion.   Chief Complaint: Confusion  Symptoms: Increased confusion  Frequency: Constant Disposition: [] ED /[] Urgent Care (no appt availability in office) / [x] Appointment(In office/virtual)/ []  [place] [facility]/ [] Home Care/ [] Refused Recommended Disposition /[] [place] [facility]/ []  Follow-up with PCP Additional Notes: Heather, clinic nurse at [facility], called to report that yesterday the patient's confusion was worse than normal. She states that the patient's confusion has been worsening since she started taking Myrbetriq . She wanted to make sure Dr. Georgia Kipper was aware of her increased confusion. Please advise.   Patient has an appointment scheduled in the office tomorrow and was seen for confusion 3 days ago.    Reason for Disposition  [1] Longstanding confusion (e.g., dementia, stroke) AND [2] worsening    Patient has appointment tomorrow  Answer Assessment - Initial Assessment Questions 1. LEVEL OF CONSCIOUSNESS: "How is he (she, the patient) acting right now?" (e.g., alert-oriented, confused, lethargic, stuporous, comatose)     Awake and alert  2. ONSET: "When did the confusion start?"  (minutes, hours, days)     Yesterday  3. PATTERN "Does this come and go, or has it been constant since it started?"  "Is it present now?"     Constant  4. ALCOHOL or DRUGS: "Has he been drinking alcohol or taking any drugs?"      No 5. NARCOTIC MEDICINES: "Has he been receiving any narcotic medications?" (e.g., morphine, Vicodin)     No 6. CAUSE: "What do you think is causing the confusion?"      Unsure  7. OTHER SYMPTOMS: "Are there any other symptoms?" (e.g., difficulty breathing, headache, fever, weakness)     No  Protocols used: Confusion - Delirium-A-AH

## 2024-04-05 NOTE — Telephone Encounter (Signed)
 Attempted to reach out to pt for more details on questions in regards to medication. However, pt has an apptmnt with PCP at 4 pm tomorrow 04/06/2024.

## 2024-04-06 ENCOUNTER — Encounter: Payer: Self-pay | Admitting: Internal Medicine

## 2024-04-06 ENCOUNTER — Ambulatory Visit (INDEPENDENT_AMBULATORY_CARE_PROVIDER_SITE_OTHER): Admitting: Internal Medicine

## 2024-04-06 VITALS — BP 158/86 | HR 86 | Temp 97.8°F | Ht 60.0 in

## 2024-04-06 DIAGNOSIS — R410 Disorientation, unspecified: Secondary | ICD-10-CM

## 2024-04-06 DIAGNOSIS — J069 Acute upper respiratory infection, unspecified: Secondary | ICD-10-CM

## 2024-04-06 DIAGNOSIS — T148XXA Other injury of unspecified body region, initial encounter: Secondary | ICD-10-CM | POA: Diagnosis not present

## 2024-04-06 DIAGNOSIS — G301 Alzheimer's disease with late onset: Secondary | ICD-10-CM

## 2024-04-06 DIAGNOSIS — F02A4 Dementia in other diseases classified elsewhere, mild, with anxiety: Secondary | ICD-10-CM

## 2024-04-06 MED ORDER — MUPIROCIN 2 % EX OINT: TOPICAL_OINTMENT | CUTANEOUS | 0 refills | Status: DC

## 2024-04-06 NOTE — Progress Notes (Unsigned)
 Subjective:  Patient ID: [name], female    DOB: 20-Mar-1936  Age: 88 y.o. MRN: [id number]  CC: Skin Problem (Increase in red sots on arms and legs. Open wound on left and right leg)   HPI [name] presents for confusion episodes, recent URI - better, neck pain [name], [name] is on a video call  [name]  Outpatient Medications Prior to Visit  Medication Sig Dispense Refill   acetaminophen  (TYLENOL ) 500 MG tablet Take 1,000 mg by mouth every 6 (six) hours as needed for mild pain.     albuterol  (VENTOLIN  HFA) 108 (90 Base) MCG/ACT inhaler Take 2 puffs every 4-6 hours as needed     Ascorbic Acid (VITAMIN C ) 100 MG tablet Take 1 tablet (100 mg total) by mouth daily. 100 tablet 1   Azelastine -Fluticasone  (DYMISTA ) 137-50 MCG/ACT SUSP Place 1 spray into the nose every 12 (twelve) hours. 23 g 0   benzonatate  (TESSALON ) 100 MG capsule Take 1 capsule (100 mg total) by mouth 3 (three) times daily as needed for cough. 14 capsule 0   Calcium  Carbonate-Vitamin D  (CALCIUM  CARBONATE W/VITAMIN D  PO) Take 1 tablet by mouth daily.     cefUROXime  (CEFTIN ) 250 MG tablet Take 1 tablet (250 mg total) by mouth 2 (two) times daily with a meal for 10 days. 20 tablet 0   cyanocobalamin  (VITAMIN B12) 1000 MCG tablet Take 1 tablet (1,000 mcg total) by mouth daily. 100 tablet 3   desonide  (DESOWEN ) 0.05 % cream Apply 1 application topically as needed.     famotidine  (PEPCID ) 40 MG tablet Take 40 mg by mouth as needed for heartburn or indigestion.     hydrocortisone  (ANUSOL -HC) 2.5 % rectal cream Place 1 Application rectally as needed for hemorrhoids or anal itching.     ipratropium (ATROVENT ) 0.06 % nasal spray Place 2 sprays into both nostrils 2 (two) times daily as needed for rhinitis.     levocetirizine (XYZAL ) 5 MG tablet Take 1 tablet (5 mg total) by mouth every evening. 30 tablet 2   levothyroxine  (SYNTHROID ) 50 MCG tablet TAKE ONE TABLET BY MOUTH DAILY BEFORE BREAKFAST 30 tablet 5    LORazepam  (ATIVAN ) 1 MG tablet Take 2-3 tablets at bedtime and 1-2 tablets in the daytime as needed. 150 tablet 2   MegaRed Omega-3 Krill Oil 500 MG CAPS Take 1 capsule by mouth every morning. 100 capsule 3   memantine  (NAMENDA  XR) 14 MG CP24 24 hr capsule Take 1 capsule (14 mg total) by mouth at bedtime. 90 capsule 11   mirabegron  ER (MYRBETRIQ ) 25 MG TB24 tablet Take 1 tablet (25 mg total) by mouth daily. 90 tablet 0   montelukast  (SINGULAIR ) 10 MG tablet Take 1 tablet (10 mg total) by mouth at bedtime. 90 tablet 1   polyethylene glycol powder (GLYCOLAX /MIRALAX ) 17 GM/SCOOP powder Take 17 g by mouth 2 (two) times daily as needed for moderate constipation. 500 g 3   psyllium (METAMUCIL) 58.6 % packet Take 1 packet by mouth as needed.     triamcinolone  ointment (KENALOG ) 0.1 % Apply 1 Application topically 3 (three) times daily. As needed for itching 80 g 2   UNABLE TO FIND Med Name: CBD oil - under tongue     amLODipine  (NORVASC ) 2.5 MG tablet Take 2.5 mg by mouth as needed. (Patient not taking: Reported on 04/06/2024)     EPINEPHrine  (EPIPEN  2-PAK) 0.3 mg/0.3 mL IJ SOAJ injection Inject 0.3 mLs (0.3 mg total) into the muscle  as needed. (Patient not taking: Reported on 02/29/2024) 1 Device 1   erythromycin  ophthalmic ointment Place 1 Application into both eyes as needed. (Patient not taking: Reported on 03/19/2024)     pantoprazole  (PROTONIX ) 40 MG tablet TAKE ONE TABLET BY MOUTH TWICE DAILY BEFORE MEALS (Patient not taking: Reported on 04/06/2024) 60 tablet 5   No facility-administered medications prior to visit.    ROS: Review of Systems  Constitutional:  Negative for activity change, appetite change, chills, fatigue and unexpected weight change.  HENT:  Negative for congestion, mouth sores and sinus pressure.   Eyes:  Negative for visual disturbance.  Respiratory:  Negative for cough and chest tightness.   Gastrointestinal:  Negative for abdominal pain and nausea.  Genitourinary:  Negative for  difficulty urinating, frequency and vaginal pain.  Musculoskeletal:  Positive for neck pain and neck stiffness. Negative for back pain and gait problem.  Skin:  Positive for color change and wound. Negative for pallor and rash.  Neurological:  Negative for dizziness, tremors, weakness, numbness and headaches.  Hematological:  Bruises/bleeds easily.  Psychiatric/Behavioral:  Positive for confusion and decreased concentration. Negative for sleep disturbance.     Objective:  BP (!) 158/86   Pulse 86   Temp 97.8 F (36.6 C)   Ht 5' (1.524 m)   SpO2 98%   BMI 21.13 kg/m   BP Readings from Last 3 Encounters:  04/06/24 (!) 158/86  04/02/24 134/76  03/25/24 (!) 158/61    Wt Readings from Last 3 Encounters:  04/02/24 108 lb 3.2 oz (49.1 kg)  03/19/24 110 lb (49.9 kg)  03/06/24 109 lb (49.4 kg)    Physical Exam Constitutional:      General: She is not in acute distress.    Appearance: She is well-developed.  HENT:     Head: Normocephalic.     Right Ear: External ear normal.     Left Ear: External ear normal.     Nose: Nose normal.  Eyes:     General:        Right eye: No discharge.        Left eye: No discharge.     Conjunctiva/sclera: Conjunctivae normal.     Pupils: Pupils are equal, round, and reactive to light.  Neck:     Thyroid : No thyromegaly.     Vascular: No JVD.     Trachea: No tracheal deviation.  Cardiovascular:     Rate and Rhythm: Normal rate and regular rhythm.     Heart sounds: Normal heart sounds.  Pulmonary:     Effort: No respiratory distress.     Breath sounds: No stridor. No wheezing.  Abdominal:     General: Bowel sounds are normal. There is no distension.     Palpations: Abdomen is soft. There is no mass.     Tenderness: There is no abdominal tenderness. There is no guarding or rebound.  Musculoskeletal:        General: Tenderness present.     Cervical back: Normal range of motion and neck supple. No rigidity.     Right lower leg: No edema.      Left lower leg: No edema.  Lymphadenopathy:     Cervical: No cervical adenopathy.  Skin:    Findings: Bruising and lesion present. No erythema or rash.  Neurological:     Cranial Nerves: No cranial nerve deficit.     Motor: No abnormal muscle tone.     Coordination: Coordination normal.     Deep  Tendon Reflexes: Reflexes normal.  Psychiatric:        Behavior: Behavior normal.        Thought Content: Thought content normal.        Judgment: Judgment normal.   Short-term memory issues are obvious during the interview, [name] would repeat the same question or may have the same statement repeatedly A little irritated when confronted The neck is stiff Bruised skin on the upper and lower extremities A couple of skin breakdowns on the lower shins (dressed with antibiotic ointment and Band-Aid here)  Lab Results  Component Value Date   WBC 8.9 02/15/2024   HGB 13.3 02/15/2024   HCT 39.2 02/15/2024   PLT 282.0 02/15/2024   GLUCOSE 109 (H) 02/15/2024   CHOL 274 (H) 02/15/2024   TRIG 181.0 (H) 02/15/2024   HDL 72.00 02/15/2024   LDLDIRECT 98.0 08/13/2021   LDLCALC 166 (H) 02/15/2024   ALT 45 (H) 02/15/2024   AST 23 02/15/2024   NA 129 (L) 02/15/2024   K 3.9 02/15/2024   CL 95 (L) 02/15/2024   CREATININE 0.52 02/15/2024   BUN 12 02/15/2024   CO2 26 02/15/2024   TSH 1.96 02/15/2024   HGBA1C 5.5 06/13/2020    DG Chest 2 View Result Date: 03/25/2024 CLINICAL DATA:  Cough EXAM: CHEST - 2 VIEW COMPARISON:  03/20/2024 FINDINGS: Lungs are well expanded, symmetric, and clear. No pneumothorax or pleural effusion. Cardiac size within normal limits. Pulmonary vascularity is normal. Osseous structures are age-appropriate. No acute bone abnormality. IMPRESSION: No active cardiopulmonary disease. Electronically Signed   By: Worthy Heads M.D.   On: 03/25/2024 20:45    Assessment & Plan:   Problem List Items Addressed This Visit     Upper respiratory infection   Resolving symptoms       Relevant Medications   mupirocin  ointment (BACTROBAN ) 2 %   Alzheimer's dementia (HCC)   Worsening dementia.  Recent illness, cold meds contributed.  Short-term memory issues are obvious during the interview, [name] would repeat the same question or may have the same statement repeatedly A little irritated when confronted We will continue on current meds. Neurology f/u w/Sarah Alane Allen       Episode of confusion - Primary   Worsening dementia.  Recent illness, cold meds contributed.  Short-term memory issues are obvious during the interview, [name] would repeat the same question or may have the same statement repeatedly A little irritated when confronted We will continue on current meds. Neurology f/u w/Sarah Alane Allen       Relevant Orders   Ambulatory referral to Neurology   Abrasion of skin   Fragile skin Use long pants, longsleeve tops Diabetic knee-high socks for skin protection Wounds were dressed with Band-Aid/antibiotic ointment         Meds ordered this encounter  Medications   mupirocin  ointment (BACTROBAN ) 2 %    Sig: On leg wound w/dressing change qd or bid    Dispense:  30 g    Refill:  0      Follow-up: Return in about 2 months (around 06/06/2024) for a follow-up visit.  Anitra Barn, MD

## 2024-04-09 ENCOUNTER — Encounter: Payer: Self-pay | Admitting: Internal Medicine

## 2024-04-09 ENCOUNTER — Other Ambulatory Visit: Payer: Self-pay | Admitting: Internal Medicine

## 2024-04-09 ENCOUNTER — Ambulatory Visit: Payer: Medicare Other | Admitting: Internal Medicine

## 2024-04-09 DIAGNOSIS — Z1231 Encounter for screening mammogram for malignant neoplasm of breast: Secondary | ICD-10-CM

## 2024-04-09 DIAGNOSIS — T148XXA Other injury of unspecified body region, initial encounter: Secondary | ICD-10-CM | POA: Insufficient documentation

## 2024-04-09 NOTE — Assessment & Plan Note (Signed)
 Fragile skin Use long pants, longsleeve tops Diabetic knee-high socks for skin protection Wounds were dressed with Band-Aid/antibiotic ointment

## 2024-04-09 NOTE — Assessment & Plan Note (Signed)
Resolving symptoms

## 2024-04-09 NOTE — Assessment & Plan Note (Signed)
 Worsening dementia.  Recent illness, cold meds contributed.  Short-term memory issues are obvious during the interview, [place] would repeat the same question or may have the same statement repeatedly A little irritated when confronted We will continue on current meds. Neurology f/u w/Sarah Wertman

## 2024-04-09 NOTE — Assessment & Plan Note (Signed)
 Worsening dementia.  Recent illness, cold meds contributed.  Short-term memory issues are obvious during the interview, [name] would repeat the same question or may have the same statement repeatedly A little irritated when confronted We will continue on current meds. Neurology f/u w/Sarah Wertman

## 2024-04-13 ENCOUNTER — Ambulatory Visit: Payer: Self-pay

## 2024-04-13 NOTE — Telephone Encounter (Signed)
 Chief Complaint: post nasal drip Symptoms: post nasal drip Frequency:  1 week Pertinent Negatives: Patient denies cough Disposition: [] ED /[] Urgent Care (no appt availability in office) / [] Appointment(In office/virtual)/ []  [place] [facility]/ [x] Home Care/ [] Refused Recommended Disposition /[] [place] [facility]/ [x]  Follow-up with PCP Additional Notes: pt states that she has been having some post nasal drip draining down her throat for about a week. States that she has done the nasal spray with no relief. State that she is not taking anything else. Pt instructed that she does have a prescription for xyzal , states that she has not been taking the medication. Instructed pt to try medication and give a call back if symptom worsen.   Copied from CRM [id number]. Topic: Clinical - Medical Advice >> Apr 13, 2024 12:59 PM Jim Motts C wrote: Reason for CRM: Patient called in to ask if Dr. Georgia Kipper has any recommendations for severe post nasal drip. Patient said she can be contacted at [phone]. Reason for Disposition  [1] Nasal allergies AND [2] only certain times of year (hay fever)  Protocols used: Nasal Allergies (Hay Fever)-A-AH

## 2024-04-15 ENCOUNTER — Other Ambulatory Visit: Payer: Self-pay | Admitting: Physician Assistant

## 2024-04-15 NOTE — Progress Notes (Signed)
 Assessment/Plan:   Mild Cognitive Impairment with memory loss, likely due to Alzheimer disease   This is a very pleasant 88 y.o. RH female with a history of hypertension, CHF, h/o posterior L frontal meningioma (16 x 11 by 17 mmm on 03/2021 MRI), hyperlipidemia, situational depression, anxiety, hypothyroidism, GERD, IBS, OA and a prior diagnosis of mild cognitive impairment with memory loss, with concern for Alzheimer's disease per neuropsych evaluation in 2024 seen today in follow up for memory loss. MMSE today is stable at 28/30.  She reports that her temporary increased confusion and memory decline will immediately related to an antibiotic taken during an upper respiratory infection and "it resolved completely, I am doing fine ".  Will continue to observe, the patient currently has been preparing to eat, but also has been "fixated " on her blood pressure medication, initially arrived more than once during the visit.  He has an appointment tomorrow to discuss that with her PCP.  Patient is currently on memantine  XR 14 mg nightly, we discussed increasing her dose to 21 mg nightly for better coverage but she adamantly declines " She is able to participate on her ADLs, no longer drives.  Mood is anxious today.   Follow up December 9 at 11:30 AM Continue memantine  XR, 14 mg nightly, at some point would like to increase to at least 21 mg nightly  repeat neuropsych evaluation in 12 months for diagnostic clarity and disease trajectory Continue psychotherapy for situational depression. Repeat MRI of the brain after December 2025 to follow-up meningioma. Recommend good control of her cardiovascular risk factors Continue to control mood as per PCP     Subjective:    This patient is here alone.  Previous records as well as any outside records available were reviewed prior to todays visit. Patient was last seen on 11/29/2023    Any changes in memory since last visit? "A few weeks ago it was worse,  when I went into an antibiotic to treat my cold, but now is better, I am more lucid, I know I was confused".  She has some difficulty remembering recent conversations and names.  "I just need a little time ". repeats oneself?  Endorsed, she is repetitive during this visit asking several times about her blood pressure medications "should I take it or not?".. Disoriented when walking into a room? Denies    Leaving objects?  May misplace things but not in unusual places   Wandering behavior?  denies   Any personality changes since last visit?  Denies.   Any worsening depression?:  She still coping with the death of her husband and she says that she is on psychotherapy. Hallucinations or paranoia?  Denies.   Seizures? denies    Any sleep changes?  "Sometimes I dream of him "-she says.  She denies, REM behavior or sleepwalking   Sleep apnea?   Denies.   Any hygiene concerns? Denies.  Independent of bathing and dressing?  Endorsed  Does the patient needs help with medications?  Son is in charge.   Who is in charge of the finances?  Son is in charge.   Any changes in appetite?  Denies.     Patient have trouble swallowing? Denies.   Does the patient cook? No Any headaches?  She has a history of severe arthritis of the neck, mood "since cervicogenic headaches, these are resolved with Tylenol  or Aleve. Any vision changes? Denies  Chronic back pain endorsed, due to arthritis.  She has some  issues with her neck and sciatica, this is followed by neurosurgery. Ambulates with difficulty? Denies.    Recent falls or head injuries? Denies.     Unilateral weakness, numbness or tingling? denies   Any tremors?  Denies   Any anosmia?  Denies   Any incontinence of urine?  She has some nocturia.   Any bowel dysfunction?   Denies      Patient lives alone at [facility]     Does the patient drive? No longer drives    Neuropsych evaluation 08/2023 Briefly, results suggested a primary weakness  surrounding delayed retrieval and recognition/consolidation aspects of memory. Further performance variability was exhibited across processing speed and visuospatial abilities. The etiology for ongoing memory dysfunction is unclear at the present time. Across memory testing, Ms. Villacis demonstrated an adequate ability to learn novel information initially. However, after brief delays, retention rates ranged from 17% to 55%. While variable, performance across yes/no recognition trials was further poor across 2/3 memory tasks. Taken together, this could suggest very early concerns for rapid forgetting and an evolving storage impairment. This would subsequently raise concern for very early stages of an underlying neurodegenerative illness such as Alzheimer's disease. It is important to highlight that performances across all non-memory areas commonly implicated by the latter illness were appropriate. I cannot rule out the presence of this illness in extremely early stages. However, it remains inappropriate to state the presence of this illness with any degree of confidence. Other potential culprits for cognitive dysfunction would surround bereavement and other depressive symptoms, her myriad of medical conditions and chronic pain, and medication side effects (especially surrounding Ativan , tramadol , and prednisone ).      Initial evaluation 11/08/2022 How long did patient have memory difficulties? [name] , her daughter told her that for the last 6 months she was "forgetting things 10 minutes later" so now she writes what she needs to remember in a pad. However, she never forgets meds or appt's, but s he may experience some difficulties with recent conversation someone's names. For a few weeks she was not active, trying to adjust to her loss, but recently" I just started to read again, watch Netflix."    repeats oneself? Denies  Disoriented when walking into a room?  Patient denies   Leaving objects in unusual  places? " I am always leaving my phone everywhere" but not in unusual places Ambulates  with difficulty?   She has neuropathy and radiculopathy, so she is "a little wobbly", she was on gabapentin , now on Lyrica. She is doing PT and balance therapy "and it helps"  Recent falls? Recently she had a mechanical fall after tripping over an object and sustaining a head injury with 1 inch laceration above the L eye and some skin tear on the R knee, no LOC, "just a little concussion".  History of seizures?   Patient denies   Wandering behavior?  Patient denies   Patient drives?   Patient no longer drives Any personality changes?  She reports more anxiety, I know he (her husband Carmelina Chinchilla)  is not here, but I talk to him good morning because it is comforting " Any history of depression?: She is experiencing grief depression as her husband of 51 years died on 11-Sep-2024. She is starting therapy Academic librarian) and will start grief group at [facility] this week.  Hallucinations or paranoia?  Patient denies   She has mild insomnia after her husband death. She falls asleep with the TV on. Denies  vivid dreams except one time she dreamt about Carmelina Chinchilla "it was nice", denies REM behavior or sleepwalking . History of sleep apnea?  Patient denies   Any hygiene concerns?  Patient denies   Independent of bathing and dressing?  Endorsed  Does the patient needs help with medications? Patient is in charge  Who is in charge of the finances?  Patient is in charge   Any changes in appetite?  Patient denies   Patient have trouble swallowing? Patient denies, I live in [facility], I don't have to Does the patient cook? Any kitchen accidents such as leaving the stove on?  Patient denies   Any headaches?  Patient denies   Double vision? Patient denies   Any focal numbness or tingling? She has chronic paresthesias.  Chronic pain?  She has some R  buttock, RLE pain due to radiculopathy, on LS injections by NS, gabapentin  and tramadol   prn..  She has a history of C spine fusion as well.  Unilateral weakness?  Patient denies   Any tremors?  Patient denies   Any history of anosmia?  Patient denies   Any incontinence of urine?  She reports nocturia  Any bowel dysfunction?   Patient denies    History of heavy alcohol intake?  Patient denies   History of heavy tobacco use?  Patient denies   Family history of dementia?   Negative for  Alzheimer's disease   Patient lives:  [facility] at [facility], her 2 sons live in [place]   Pertinent studies 2 D echo  EF 60-65, mild AI, no sign AS B12 229 (December 2023)    MRI of the brain personally reviewed remarkable for a known calcified meningioma on the posterior left frontal lobe measuring 1.7 x 1.6 cm and change since prior MRI in May 2022.  No parenchymal edema was noted.  There is mild chronic small vessel ischemic changes within the cerebral white matter, slightly progressed from prior MRI, as well as mild to moderate generalized cerebral atrophy, which is stable.    Initial evaluation 11/08/2022 How long did patient have memory difficulties? [name] , her daughter told her that for the last 6 months she was "forgetting things 10 minutes later" so now she writes what she needs to remember in a pad. However, she never forgets meds or appt's, but s he may experience some difficulties with recent conversation someone's names. For a few weeks she was not active, trying to adjust to her loss, but recently" I just started to read again, watch Netflix."    repeats oneself? Denies  Disoriented when walking into a room?  Patient denies   Leaving objects in unusual places? " I am always leaving my phone everywhere" but not in unusual places Ambulates  with difficulty?   She has neuropathy and radiculopathy, so she is "a little wobbly", she was on gabapentin , now on Lyrica. She is doing PT and balance therapy "and it helps"  Recent falls? Recently she had a mechanical fall after tripping over an object  and sustaining a head injury with 1 inch laceration above the L eye and some skin tear on the R knee, no LOC, "just a little concussion".  History of seizures?   Patient denies   Wandering behavior?  Patient denies   Patient drives?   Patient no longer drives Any personality changes?  She reports more anxiety, I know he (her husband Carmelina Chinchilla)  is not here, but I talk to him good morning because it  is comforting " Any history of depression?: She is experiencing grief depression as her husband of 45 years died on 10-01-24. She is starting therapy Academic librarian) and will start grief group at [facility] this week.  Hallucinations or paranoia?  Patient denies   She has mild insomnia after her husband death. She falls asleep with the TV on. Denies vivid dreams except one time she dreamt about Carmelina Chinchilla "it was nice", denies REM behavior or sleepwalking . History of sleep apnea?  Patient denies   Any hygiene concerns?  Patient denies   Independent of bathing and dressing?  Endorsed  Does the patient needs help with medications? Patient is in charge  Who is in charge of the finances?  Patient is in charge   Any changes in appetite?  Patient denies   Patient have trouble swallowing? Patient denies, I live in [facility], I don't have to Does the patient cook? Any kitchen accidents such as leaving the stove on?  Patient denies   Any headaches?  Patient denies   Double vision? Patient denies   Any focal numbness or tingling? She has chronic paresthesias.  Chronic pain?  She has some R  buttock, RLE pain due to radiculopathy, on LS injections by NS, gabapentin  and tramadol  prn..  She has a history of C spine fusion as well.  Unilateral weakness?  Patient denies   Any tremors?  Patient denies   Any history of anosmia?  Patient denies   Any incontinence of urine?  She reports nocturia  Any bowel dysfunction?   Patient denies    History of heavy alcohol intake?  Patient denies   History of heavy tobacco use?   Patient denies   Family history of dementia?   Negative for  Alzheimer's disease   Patient lives:  [facility] at [facility], her 2 sons live in [place]   Pertinent studies 2 D echo  EF 60-65, mild AI, no sign AS B12 229 (December 2023)   MRI brain personally reviewed 10/31/23 remarkable for known,  17 x 16 mm partially calcified meningioma overlying the posterior left frontal lobe, unchanged in size from the brain MRI of 12/04/2022. As before, the mass indents the underlying brain parenchyma without underlying parenchymal edema. 2. Mild chronic small vessel ischemic changes within the cerebral white matter, similar to the prior MRI. 3. Mild generalized cerebral atrophy. 4. Mild right sphenoid sinusitis.  PREVIOUS MEDICATIONS:   CURRENT MEDICATIONS:  Outpatient Encounter Medications as of 04/16/2024  Medication Sig   acetaminophen  (TYLENOL ) 500 MG tablet Take 1,000 mg by mouth every 6 (six) hours as needed for mild pain.   albuterol  (VENTOLIN  HFA) 108 (90 Base) MCG/ACT inhaler Take 2 puffs every 4-6 hours as needed   amLODipine  (NORVASC ) 2.5 MG tablet Take 2.5 mg by mouth as needed. (Patient not taking: Reported on 04/06/2024)   Ascorbic Acid (VITAMIN C ) 100 MG tablet Take 1 tablet (100 mg total) by mouth daily.   Azelastine -Fluticasone  (DYMISTA ) 137-50 MCG/ACT SUSP Place 1 spray into the nose every 12 (twelve) hours.   benzonatate  (TESSALON ) 100 MG capsule Take 1 capsule (100 mg total) by mouth 3 (three) times daily as needed for cough.   Calcium  Carbonate-Vitamin D  (CALCIUM  CARBONATE W/VITAMIN D  PO) Take 1 tablet by mouth daily.   cyanocobalamin  (VITAMIN B12) 1000 MCG tablet Take 1 tablet (1,000 mcg total) by mouth daily.   desonide  (DESOWEN ) 0.05 % cream Apply 1 application topically as needed.   EPINEPHrine  (EPIPEN  2-PAK) 0.3  mg/0.3 mL IJ SOAJ injection Inject 0.3 mLs (0.3 mg total) into the muscle as needed. (Patient not taking: Reported on 02/29/2024)   erythromycin  ophthalmic ointment Place 1  Application into both eyes as needed. (Patient not taking: Reported on 03/19/2024)   famotidine  (PEPCID ) 40 MG tablet Take 40 mg by mouth as needed for heartburn or indigestion.   hydrocortisone  (ANUSOL -HC) 2.5 % rectal cream Place 1 Application rectally as needed for hemorrhoids or anal itching.   ipratropium (ATROVENT ) 0.06 % nasal spray Place 2 sprays into both nostrils 2 (two) times daily as needed for rhinitis.   levocetirizine (XYZAL ) 5 MG tablet Take 1 tablet (5 mg total) by mouth every evening.   levothyroxine  (SYNTHROID ) 50 MCG tablet TAKE ONE TABLET BY MOUTH DAILY BEFORE BREAKFAST   LORazepam  (ATIVAN ) 1 MG tablet Take 2-3 tablets at bedtime and 1-2 tablets in the daytime as needed.   MegaRed Omega-3 Krill Oil 500 MG CAPS Take 1 capsule by mouth every morning.   memantine  (NAMENDA  XR) 14 MG CP24 24 hr capsule Take 1 capsule (14 mg total) by mouth at bedtime.   mirabegron  ER (MYRBETRIQ ) 25 MG TB24 tablet Take 1 tablet (25 mg total) by mouth daily.   montelukast  (SINGULAIR ) 10 MG tablet Take 1 tablet (10 mg total) by mouth at bedtime.   mupirocin  ointment (BACTROBAN ) 2 % On leg wound w/dressing change qd or bid   pantoprazole  (PROTONIX ) 40 MG tablet TAKE ONE TABLET BY MOUTH TWICE DAILY BEFORE MEALS (Patient not taking: Reported on 04/06/2024)   polyethylene glycol powder (GLYCOLAX /MIRALAX ) 17 GM/SCOOP powder Take 17 g by mouth 2 (two) times daily as needed for moderate constipation.   psyllium (METAMUCIL) 58.6 % packet Take 1 packet by mouth as needed.   triamcinolone  ointment (KENALOG ) 0.1 % Apply 1 Application topically 3 (three) times daily. As needed for itching   UNABLE TO FIND Med Name: CBD oil - under tongue   No facility-administered encounter medications on file as of 04/16/2024.       04/16/2024   12:00 PM 02/10/2017    1:39 PM  MMSE - Mini Mental State Exam  Orientation to time 4 5  Orientation to Place 5 5  Registration 3 3  Attention/ Calculation 5 5  Recall 2 2   Language- name 2 objects 2 2  Language- repeat 1 1  Language- follow 3 step command 3 3  Language- read & follow direction 1 1  Write a sentence 1 1  Copy design 1 1  Total score 28 29      11/08/2022    7:00 PM  Montreal Cognitive Assessment   Visuospatial/ Executive (0/5) 1  Naming (0/3) 1  Attention: Read list of digits (0/2) 2  Attention: Read list of letters (0/1) 1  Attention: Serial 7 subtraction starting at 100 (0/3) 3  Language: Repeat phrase (0/2) 2  Language : Fluency (0/1) 1  Abstraction (0/2) 1  Delayed Recall (0/5) 3  Orientation (0/6) 6  Total 21  Adjusted Score (based on education) 22    Objective:     PHYSICAL EXAMINATION:    VITALS:   Vitals:   04/16/24 1104  BP: 128/74  Pulse: 93  Resp: 20  SpO2: 95%  Weight: 111 lb (50.3 kg)  Height: 5' (1.524 m)    GEN:  The patient appears stated age and is in NAD. HEENT:  Normocephalic, atraumatic.   Neurological examination:  General: NAD, well-groomed, appears stated age. Orientation: The patient is alert. Oriented  to person, place and date Cranial nerves: There is good facial symmetry.The speech is fluent and clear. No aphasia or dysarthria. Fund of knowledge is appropriate. Recent and remote memory are impaired. Attention and concentration are reduced. Able to name objects and repeat phrases.  Hearing is intact to conversational tone.  Delayed recall 2/3 Sensation: Sensation is intact to light touch throughout Motor: Strength is at least antigravity x4. DTR's 2/4 in UE/LE     Movement examination: Tone: There is normal tone in the UE/LE Abnormal movements:  no tremor.  No myoclonus.  No asterixis.   Coordination:  There is no decremation with RAM's. Normal finger to nose  Gait and Station: The patient has no difficulty arising out of a deep-seated chair without the use of the hands. The patient's stride length is good.  Gait is cautious and narrow.    Thank you for allowing us  the opportunity  to participate in the care of this nice patient. Please do not hesitate to contact us  for any questions or concerns.   Total time spent on today's visit was 40 minutes dedicated to this patient today, preparing to see patient, examining the patient, ordering tests and/or medications and counseling the patient, documenting clinical information in the EHR or other health record, independently interpreting results and communicating results to the patient/family, discussing treatment and goals, answering patient's questions and coordinating care.  Cc:  Plotnikov, Oakley Bellman, MD  Tex Filbert 04/16/2024 12:34 PM

## 2024-04-16 ENCOUNTER — Ambulatory Visit: Payer: Self-pay

## 2024-04-16 ENCOUNTER — Encounter: Payer: Self-pay | Admitting: Physician Assistant

## 2024-04-16 ENCOUNTER — Encounter: Payer: Self-pay | Admitting: Internal Medicine

## 2024-04-16 ENCOUNTER — Telehealth: Payer: Self-pay | Admitting: Internal Medicine

## 2024-04-16 ENCOUNTER — Ambulatory Visit
Admission: RE | Admit: 2024-04-16 | Discharge: 2024-04-16 | Disposition: A | Discharge status: Home / Self Care | Source: Ambulatory Visit | Attending: Internal Medicine | Admitting: Internal Medicine

## 2024-04-16 ENCOUNTER — Ambulatory Visit (INDEPENDENT_AMBULATORY_CARE_PROVIDER_SITE_OTHER): Admitting: Physician Assistant

## 2024-04-16 VITALS — BP 128/74 | HR 93 | Resp 20 | Ht 60.0 in | Wt 111.0 lb

## 2024-04-16 DIAGNOSIS — Z1231 Encounter for screening mammogram for malignant neoplasm of breast: Secondary | ICD-10-CM

## 2024-04-16 DIAGNOSIS — G3184 Mild cognitive impairment, so stated: Secondary | ICD-10-CM | POA: Diagnosis not present

## 2024-04-16 DIAGNOSIS — D329 Benign neoplasm of meninges, unspecified: Secondary | ICD-10-CM

## 2024-04-16 NOTE — Telephone Encounter (Signed)
 Attempted to call patient regarding message below. Also attempted to call pt son, [name] based on 5/19 message regarding pt symptoms.  No response noted, unable to leave VM.  Please note, BP parameters needed for medications.     Copied from CRM [id number]. Topic: Clinical - Medication Question >> Apr 16, 2024 12:44 PM [name] wrote: Reason for CRM: Patient needs to confirm if she needs to be taking blood pressure amLODipine  (NORVASC ) 2.5 MG tablet consistently or only when pressure goes up or not at all, has only been taking as needed when pressure is up.  [name]

## 2024-04-16 NOTE — Telephone Encounter (Unsigned)
 Copied from CRM [id number]. Topic: Appointments - Scheduling Inquiry for Clinic >> Apr 16, 2024  9:38 AM [name] wrote: Reason for CRM: Pt appointment was somehow cancel in error, pt wanted to keep follow up appt for 05/20 3:40pm as she been having BP issues- Pt state she did not cancel the appt, pt will like to be contacted at [phone]

## 2024-04-16 NOTE — Patient Instructions (Signed)
 It was a pleasure to see you today at our office.   Recommendations:  Memantine  XR  14 mg at night     Follow up in Dec 9 at 11:30     Whom to call:  Memory  decline, memory medications: Call our office [phone]   For psychiatric meds, mood meds: Please have your primary care physician manage these medications.    For assessment of decision of mental capacity and competency:  Call Dr. Laverne Potter, geriatric psychiatrist at [phone]  For guidance in geriatric dementia issues please call [facility] [phone]     If you have any severe symptoms of a stroke, or other severe issues such as confusion,severe chills or fever, etc call 911 or go to the ER as you may need to be evaluated further         RECOMMENDATIONS FOR ALL PATIENTS WITH MEMORY PROBLEMS: 1. Continue to exercise (Recommend 30 minutes of walking everyday, or 3 hours every week) 2. Increase social interactions - continue going to [place] and enjoy social gatherings with friends and family 3. Eat healthy, avoid fried foods and eat more fruits and vegetables 4. Maintain adequate blood pressure, blood sugar, and blood cholesterol level. Reducing the risk of stroke and cardiovascular disease also helps promoting better memory. 5. Avoid stressful situations. Live a simple life and avoid aggravations. Organize your time and prepare for the next day in anticipation. 6. Sleep well, avoid any interruptions of sleep and avoid any distractions in the bedroom that may interfere with adequate sleep quality 7. Avoid sugar, avoid sweets as there is a strong link between excessive sugar intake, diabetes, and cognitive impairment We discussed the Mediterranean diet, which has been shown to help patients reduce the risk of progressive memory disorders and reduces cardiovascular risk. This includes eating fish, eat fruits and green leafy vegetables, nuts like almonds and hazelnuts, walnuts, and also use olive oil.  Avoid fast foods and fried foods as much as possible. Avoid sweets and sugar as sugar use has been linked to worsening of memory function.  There is always a concern of gradual progression of memory problems. If this is the case, then we may need to adjust level of care according to patient needs. Support, both to the patient and caregiver, should then be put into place.     FALL PRECAUTIONS: Be cautious when walking. Scan the area for obstacles that may increase the risk of trips and falls. When getting up in the mornings, sit up at the edge of the bed for a few minutes before getting out of bed. Consider elevating the bed at the head end to avoid drop of blood pressure when getting up. Walk always in a well-lit room (use night lights in the walls). Avoid area rugs or power cords from appliances in the middle of the walkways. Use a walker or a cane if necessary and consider physical therapy for balance exercise. Get your eyesight checked regularly.  FINANCIAL OVERSIGHT: Supervision, especially oversight when making financial decisions or transactions is also recommended.  HOME SAFETY: Consider the safety of the kitchen when operating appliances like stoves, microwave oven, and blender. Consider having supervision and share cooking responsibilities until no longer able to participate in those. Accidents with firearms and other hazards in the house should be identified and addressed as well.   ABILITY TO BE LEFT ALONE: If patient is unable to contact 911 operator, consider using LifeLine, or when the need is there, arrange for someone  to stay with patients. Smoking is a fire hazard, consider supervision or cessation. Risk of wandering should be assessed by caregiver and if detected at any point, supervision and safe proof recommendations should be instituted.  MEDICATION SUPERVISION: Inability to self-administer medication needs to be constantly addressed. Implement a mechanism to ensure safe  administration of the medications.   DRIVING: Regarding driving, in patients with progressive memory problems, driving will be impaired. We advise to have someone else do the driving if trouble finding directions or if minor accidents are reported. Independent driving assessment is available to determine safety of driving.   If you are interested in the driving assessment, you can contact the following:  [employer] in [place] [phone]  [facility] [phone]  [facility] [phone] or [phone]    Mediterranean Diet A Mediterranean diet refers to food and lifestyle choices that are based on the traditions of countries located on the [employer]. This way of eating has been shown to help prevent certain conditions and improve outcomes for people who have chronic diseases, like kidney disease and heart disease. What are tips for following this plan? Lifestyle  Cook and eat meals together with your family, when possible. Drink enough fluid to keep your urine clear or pale yellow. Be physically active every day. This includes: Aerobic exercise like running or swimming. Leisure activities like gardening, walking, or housework. Get 7-8 hours of sleep each night. If recommended by your health care provider, drink red wine in moderation. This means 1 glass a day for nonpregnant women and 2 glasses a day for men. A glass of wine equals 5 oz (150 mL). Reading food labels  Check the serving size of packaged foods. For foods such as rice and pasta, the serving size refers to the amount of cooked product, not dry. Check the total fat in packaged foods. Avoid foods that have saturated fat or trans fats. Check the ingredients list for added sugars, such as corn syrup. Shopping  At the grocery store, buy most of your food from the areas near the walls of the store. This includes: Fresh fruits and  vegetables (produce). Grains, beans, nuts, and seeds. Some of these may be available in unpackaged forms or large amounts (in bulk). Fresh seafood. Poultry and eggs. Low-fat dairy products. Buy whole ingredients instead of prepackaged foods. Buy fresh fruits and vegetables in-season from local farmers markets. Buy frozen fruits and vegetables in resealable bags. If you do not have access to quality fresh seafood, buy precooked frozen shrimp or canned fish, such as tuna, salmon, or sardines. Buy small amounts of raw or cooked vegetables, salads, or olives from the deli or salad bar at your store. Stock your pantry so you always have certain foods on hand, such as olive oil, canned tuna, canned tomatoes, rice, pasta, and beans. Cooking  Cook foods with extra-virgin olive oil instead of using butter or other vegetable oils. Have meat as a side dish, and have vegetables or grains as your main dish. This means having meat in small portions or adding small amounts of meat to foods like pasta or stew. Use beans or vegetables instead of meat in common dishes like chili or lasagna. Experiment with different cooking methods. Try roasting or broiling vegetables instead of steaming or sauteing them. Add frozen vegetables to soups, stews, pasta, or rice. Add nuts or seeds for added healthy fat at each meal. You can add these to yogurt, salads, or  vegetable dishes. Marinate fish or vegetables using olive oil, lemon juice, garlic, and fresh herbs. Meal planning  Plan to eat 1 vegetarian meal one day each week. Try to work up to 2 vegetarian meals, if possible. Eat seafood 2 or more times a week. Have healthy snacks readily available, such as: Vegetable sticks with hummus. Greek yogurt. Fruit and nut trail mix. Eat balanced meals throughout the week. This includes: Fruit: 2-3 servings a day Vegetables: 4-5 servings a day Low-fat dairy: 2 servings a day Fish, poultry, or lean meat: 1 serving a  day Beans and legumes: 2 or more servings a week Nuts and seeds: 1-2 servings a day Whole grains: 6-8 servings a day Extra-virgin olive oil: 3-4 servings a day Limit red meat and sweets to only a few servings a month What are my food choices? Mediterranean diet Recommended Grains: Whole-grain pasta. Brown rice. Bulgar wheat. Polenta. Couscous. Whole-wheat bread. Dwyane Glad. Vegetables: Artichokes. Beets. Broccoli. Cabbage. Carrots. Eggplant. Green beans. Chard. Kale. Spinach. Onions. Leeks. Peas. Squash. Tomatoes. Peppers. Radishes. Fruits: Apples. Apricots. Avocado. Berries. Bananas. Cherries. Dates. Figs. Grapes. Lemons. Melon. Oranges. Peaches. Plums. Pomegranate. Meats and other protein foods: Beans. Almonds. Sunflower seeds. Pine nuts. Peanuts. Cod. Salmon. Scallops. Shrimp. Tuna. Tilapia. Clams. Oysters. Eggs. Dairy: Low-fat milk. Cheese. Greek yogurt. Beverages: Water. Red wine. Herbal tea. Fats and oils: Extra virgin olive oil. Avocado oil. Grape seed oil. Sweets and desserts: [place] yogurt with honey. Baked apples. Poached pears. Trail mix. Seasoning and other foods: Basil. Cilantro. Coriander. Cumin. Mint. Parsley. Sage. Rosemary. Tarragon. Garlic. Oregano. Thyme. Pepper. Balsalmic vinegar. Tahini. Hummus. Tomato sauce. Olives. Mushrooms. Limit these Grains: Prepackaged pasta or rice dishes. Prepackaged cereal with added sugar. Vegetables: Deep fried potatoes (french fries). Fruits: Fruit canned in syrup. Meats and other protein foods: Beef. Pork. Lamb. Poultry with skin. Hot dogs. Helene Loader. Dairy: Ice cream. Sour cream. Whole milk. Beverages: Juice. Sugar-sweetened soft drinks. Beer. Liquor and spirits. Fats and oils: Butter. Canola oil. Vegetable oil. Beef fat (tallow). Lard. Sweets and desserts: Cookies. Cakes. Pies. Candy. Seasoning and other foods: Mayonnaise. Premade sauces and marinades. The items listed may not be a complete list. Talk with your dietitian about what  dietary choices are right for you. Summary The Mediterranean diet includes both food and lifestyle choices. Eat a variety of fresh fruits and vegetables, beans, nuts, seeds, and whole grains. Limit the amount of red meat and sweets that you eat. Talk with your health care provider about whether it is safe for you to drink red wine in moderation. This means 1 glass a day for nonpregnant women and 2 glasses a day for men. A glass of wine equals 5 oz (150 mL). This information is not intended to replace advice given to you by your health care provider. Make sure you discuss any questions you have with your health care provider. Document Released: 07/08/2016 Document Revised: 09/12/2 Your provider has requested that you have labwork completed today. Please go to [facility] ([id number]) on the second floor of this building before leaving the office today. You do not need to check in. If you are not called within 15 minutes please check with the front desk.  Your Mri will be At [facility]

## 2024-04-17 ENCOUNTER — Ambulatory Visit: Admitting: Internal Medicine

## 2024-04-19 NOTE — Telephone Encounter (Signed)
 [name] can restart Amlodipine  2.5 mg daily if her blood pressure was consistently elevated >150 for several days.  Thank you

## 2024-04-19 NOTE — Telephone Encounter (Signed)
 Tried to reach pt and inform her of providers instructions as follows "Ryen can restart Amlodipine  2.5 mg daily if her blood pressure was consistently elevated >150 for several days.  Thank you"  Pt did nt answer and I am unable to LVM at this time. Please inform pt or her son if they call back of the above information.

## 2024-04-20 ENCOUNTER — Other Ambulatory Visit: Payer: Self-pay | Admitting: Internal Medicine

## 2024-04-23 ENCOUNTER — Other Ambulatory Visit: Payer: Self-pay | Admitting: Internal Medicine

## 2024-04-24 ENCOUNTER — Other Ambulatory Visit: Payer: Self-pay | Admitting: Internal Medicine

## 2024-04-26 ENCOUNTER — Other Ambulatory Visit: Payer: Self-pay | Admitting: Family Medicine

## 2024-04-26 DIAGNOSIS — J310 Chronic rhinitis: Secondary | ICD-10-CM

## 2024-04-30 ENCOUNTER — Encounter: Payer: Self-pay | Admitting: Internal Medicine

## 2024-04-30 ENCOUNTER — Ambulatory Visit (INDEPENDENT_AMBULATORY_CARE_PROVIDER_SITE_OTHER): Admitting: Internal Medicine

## 2024-04-30 VITALS — BP 118/70 | HR 78 | Temp 98.2°F | Ht 60.0 in | Wt 110.0 lb

## 2024-04-30 DIAGNOSIS — G301 Alzheimer's disease with late onset: Secondary | ICD-10-CM | POA: Diagnosis not present

## 2024-04-30 DIAGNOSIS — F02A4 Dementia in other diseases classified elsewhere, mild, with anxiety: Secondary | ICD-10-CM

## 2024-04-30 DIAGNOSIS — T148XXA Other injury of unspecified body region, initial encounter: Secondary | ICD-10-CM

## 2024-04-30 DIAGNOSIS — R269 Unspecified abnormalities of gait and mobility: Secondary | ICD-10-CM

## 2024-04-30 DIAGNOSIS — R0982 Postnasal drip: Secondary | ICD-10-CM | POA: Diagnosis not present

## 2024-04-30 NOTE — Assessment & Plan Note (Signed)
Use a walker 

## 2024-04-30 NOTE — Progress Notes (Signed)
 Subjective:  Patient ID: [name], female    DOB: [birth year]  Age: 88 y.o. MRN: [id number]  CC: Medical Management of Chronic Issues (6 week f/u, /Pt is wanting to know when should she take her Amlodipine )   HPI [name] presents for post nasal drip F/u on cough - no moe C/o neck pain, bruises Not taking Tramadol   Outpatient Medications Prior to Visit  Medication Sig Dispense Refill   acetaminophen  (TYLENOL ) 500 MG tablet Take 1,000 mg by mouth every 6 (six) hours as needed for mild pain.     albuterol  (VENTOLIN  HFA) 108 (90 Base) MCG/ACT inhaler Take 2 puffs every 4-6 hours as needed     Ascorbic Acid (VITAMIN C ) 100 MG tablet Take 1 tablet (100 mg total) by mouth daily. 100 tablet 1   azelastine  (ASTELIN ) 0.1 % nasal spray 1 spray in each nostril Nasally Once a day in the evening     Azelastine -Fluticasone  137-50 MCG/ACT SUSP USE ONE SPRAY IN EACH NOSTRIL EVERY TWELVE HOURS 23 g 0   benzonatate  (TESSALON ) 100 MG capsule Take 1 capsule (100 mg total) by mouth 3 (three) times daily as needed for cough. 14 capsule 0   Calcium  Carbonate-Vitamin D  (CALCIUM  CARBONATE W/VITAMIN D  PO) Take 1 tablet by mouth daily.     cyanocobalamin  (VITAMIN B12) 1000 MCG tablet Take 1 tablet (1,000 mcg total) by mouth daily. 100 tablet 3   desonide  (DESOWEN ) 0.05 % cream Apply 1 application topically as needed.     famotidine  (PEPCID ) 40 MG tablet Take 40 mg by mouth as needed for heartburn or indigestion.     hydrocortisone  (ANUSOL -HC) 2.5 % rectal cream Place 1 Application rectally as needed for hemorrhoids or anal itching.     ipratropium (ATROVENT ) 0.06 % nasal spray Place 2 sprays into both nostrils 2 (two) times daily as needed for rhinitis.     levocetirizine (XYZAL ) 5 MG tablet Take 1 tablet (5 mg total) by mouth every evening. 30 tablet 2   levothyroxine  (SYNTHROID ) 50 MCG tablet TAKE ONE TABLET BY MOUTH DAILY BEFORE BREAKFAST 30 tablet 5   LORazepam  (ATIVAN ) 1 MG tablet Take 2-3 tablets at  bedtime and 1-2 tablets in the daytime as needed. 150 tablet 2   MegaRed Omega-3 Krill Oil 500 MG CAPS Take 1 capsule by mouth every morning. 100 capsule 3   memantine  (NAMENDA  XR) 14 MG CP24 24 hr capsule Take 1 capsule (14 mg total) by mouth at bedtime. 90 capsule 11   mirabegron  ER (MYRBETRIQ ) 25 MG TB24 tablet Take 1 tablet (25 mg total) by mouth daily. 90 tablet 0   montelukast  (SINGULAIR ) 10 MG tablet Take 1 tablet (10 mg total) by mouth at bedtime. 90 tablet 1   mupirocin  ointment (BACTROBAN ) 2 % apply to leg wound with dressing change once or twice a day 30 g 0   polyethylene glycol powder (GLYCOLAX /MIRALAX ) 17 GM/SCOOP powder Take 17 g by mouth 2 (two) times daily as needed for moderate constipation. 500 g 3   psyllium (METAMUCIL) 58.6 % packet Take 1 packet by mouth as needed.     triamcinolone  ointment (KENALOG ) 0.1 % Apply 1 Application topically 3 (three) times daily. As needed for itching 80 g 2   UNABLE TO FIND Med Name: CBD oil - under tongue     amLODipine  (NORVASC ) 2.5 MG tablet Take 2.5 mg by mouth as needed. (Patient not taking: Reported on 04/30/2024)     EPINEPHrine  (EPIPEN  2-PAK) 0.3 mg/0.3 mL  IJ SOAJ injection Inject 0.3 mLs (0.3 mg total) into the muscle as needed. (Patient not taking: Reported on 02/29/2024) 1 Device 1   erythromycin  ophthalmic ointment Place 1 Application into both eyes as needed. (Patient not taking: Reported on 04/30/2024)     pantoprazole  (PROTONIX ) 40 MG tablet TAKE ONE TABLET BY MOUTH TWICE DAILY BEFORE MEALS (Patient not taking: Reported on 04/30/2024) 60 tablet 5   No facility-administered medications prior to visit.    ROS: Review of Systems  Constitutional:  Positive for fatigue. Negative for activity change, appetite change, chills and unexpected weight change.  HENT:  Negative for congestion, mouth sores and sinus pressure.   Eyes:  Negative for visual disturbance.  Respiratory:  Negative for cough and chest tightness.   Gastrointestinal:   Negative for abdominal pain, nausea and vomiting.  Genitourinary:  Negative for difficulty urinating, frequency and vaginal pain.  Musculoskeletal:  Positive for arthralgias, back pain, gait problem, neck pain and neck stiffness.  Skin:  Negative for pallor and rash.  Neurological:  Negative for dizziness, tremors, weakness, numbness and headaches.  Psychiatric/Behavioral:  Positive for decreased concentration. Negative for confusion, self-injury, sleep disturbance and suicidal ideas. The patient is nervous/anxious.     Objective:  BP 118/70   Pulse 78   Temp 98.2 F (36.8 C) (Oral)   Ht 5' (1.524 m)   Wt 110 lb (49.9 kg)   SpO2 96%   BMI 21.48 kg/m   BP Readings from Last 3 Encounters:  04/30/24 118/70  04/16/24 128/74  04/06/24 (!) 158/86    Wt Readings from Last 3 Encounters:  04/30/24 110 lb (49.9 kg)  04/16/24 111 lb (50.3 kg)  04/02/24 108 lb 3.2 oz (49.1 kg)    Physical Exam Constitutional:      General: She is not in acute distress.    Appearance: Normal appearance. She is well-developed.  HENT:     Head: Normocephalic.     Right Ear: External ear normal.     Left Ear: External ear normal.     Nose: Nose normal.  Eyes:     General:        Right eye: No discharge.        Left eye: No discharge.     Conjunctiva/sclera: Conjunctivae normal.     Pupils: Pupils are equal, round, and reactive to light.  Neck:     Thyroid : No thyromegaly.     Vascular: No JVD.     Trachea: No tracheal deviation.  Cardiovascular:     Rate and Rhythm: Normal rate and regular rhythm.     Heart sounds: Normal heart sounds.  Pulmonary:     Effort: No respiratory distress.     Breath sounds: No stridor. No wheezing.  Abdominal:     General: Bowel sounds are normal. There is no distension.     Palpations: Abdomen is soft. There is no mass.     Tenderness: There is no abdominal tenderness. There is no guarding or rebound.  Musculoskeletal:        General: No tenderness.      Cervical back: Normal range of motion and neck supple. No rigidity.     Right lower leg: No edema.     Left lower leg: No edema.  Lymphadenopathy:     Cervical: No cervical adenopathy.  Skin:    Findings: Bruising present. No erythema or rash.  Neurological:     Mental Status: She is oriented to person, place, and time.  Cranial Nerves: No cranial nerve deficit.     Motor: No abnormal muscle tone.     Coordination: Coordination normal.     Gait: Gait abnormal.     Deep Tendon Reflexes: Reflexes normal.  Psychiatric:        Behavior: Behavior normal.        Thought Content: Thought content normal.        Judgment: Judgment normal.     Lab Results  Component Value Date   WBC 8.9 02/15/2024   HGB 13.3 02/15/2024   HCT 39.2 02/15/2024   PLT 282.0 02/15/2024   GLUCOSE 109 (H) 02/15/2024   CHOL 274 (H) 02/15/2024   TRIG 181.0 (H) 02/15/2024   HDL 72.00 02/15/2024   LDLDIRECT 98.0 08/13/2021   LDLCALC 166 (H) 02/15/2024   ALT 45 (H) 02/15/2024   AST 23 02/15/2024   NA 129 (L) 02/15/2024   K 3.9 02/15/2024   CL 95 (L) 02/15/2024   CREATININE 0.52 02/15/2024   BUN 12 02/15/2024   CO2 26 02/15/2024   TSH 1.96 02/15/2024   HGBA1C 5.5 06/13/2020    MM 3D SCREENING MAMMOGRAM BILATERAL BREAST Result Date: 04/19/2024 CLINICAL DATA:  Screening. EXAM: DIGITAL SCREENING BILATERAL MAMMOGRAM WITH TOMOSYNTHESIS AND CAD TECHNIQUE: Bilateral screening digital craniocaudal and mediolateral oblique mammograms were obtained. Bilateral screening digital breast tomosynthesis was performed. The images were evaluated with computer-aided detection. COMPARISON:  Previous exam(s). ACR Breast Density Category b: There are scattered areas of fibroglandular density. FINDINGS: There are no findings suspicious for malignancy. IMPRESSION: No mammographic evidence of malignancy. A result letter of this screening mammogram will be mailed directly to the patient. RECOMMENDATION: Screening mammogram in one  year. (Code:SM-B-01Y) BI-RADS CATEGORY  1: Negative. Electronically Signed   By: Anna Barnes M.D.   On: 04/19/2024 15:16    Assessment & Plan:   Problem List Items Addressed This Visit     Gait disorder   Use a walker      Bruising   Arnicare cream for bruises      Alzheimer's dementia (HCC)   We will continue on current meds. Neurology f/u w/Sarah Alane Allen  Not taking Tramadol       Postnasal drip - Primary   Afrin is the only one that works well - use prn          No orders of the defined types were placed in this encounter.     Follow-up: Return in about 2 months (around 06/30/2024) for a follow-up visit.  Anitra Barn, MD

## 2024-04-30 NOTE — Assessment & Plan Note (Signed)
 Arnicare cream for bruises

## 2024-04-30 NOTE — Assessment & Plan Note (Addendum)
 We will continue on current meds. Neurology f/u w/Sarah Alane Allen  Not taking Tramadol 

## 2024-04-30 NOTE — Patient Instructions (Signed)
 Arnicare cream for bruises

## 2024-04-30 NOTE — Assessment & Plan Note (Signed)
 Afrin is the only one that works well - use prn

## 2024-05-01 ENCOUNTER — Ambulatory Visit: Admitting: Sports Medicine

## 2024-05-03 ENCOUNTER — Ambulatory Visit: Admitting: Sports Medicine

## 2024-05-03 VITALS — BP 134/70 | Ht 60.0 in | Wt 110.0 lb

## 2024-05-03 DIAGNOSIS — M62830 Muscle spasm of back: Secondary | ICD-10-CM | POA: Diagnosis not present

## 2024-05-03 NOTE — Progress Notes (Signed)
 CC: chronic trapezius spasm on RT (here with son)  Patient with a fused cervical spine Periodically gets neck pain but usually more pain over trapezius primarily on RT ESWT has helped Now using tylenol  bid and that helps Voltaren  cream helps some as well Heat is helpful but ice is not  Pain level now is at mild to moderate level for her  No radicular sxs into the arm  ROS Walking much better and feels pretty stable [name] has resolved (we used ESWT)  PE Pleasant elderly F NAD BP 134/70   Ht 5' (1.524 m)   Wt 110 lb (49.9 kg)   BMI 21.48 kg/m   Neck is rigid with no lateral bend or rotation Only a few deg of extension but full flexion No sxs with this  Moderate spasm in RT trapezius  No scapular dysfunction Full ROM of shoulders  Normal UE neuro testing

## 2024-05-03 NOTE — Patient Instructions (Signed)
 We gave you 3 exercises today: -Shoulder swings, shoulder shrugs and diagonal arm passes -Continue using tylenol , heat and arnica as you have been. -Follow up as needed.

## 2024-05-07 ENCOUNTER — Ambulatory Visit (INDEPENDENT_AMBULATORY_CARE_PROVIDER_SITE_OTHER)

## 2024-05-07 VITALS — BP 110/62 | HR 74 | Ht 60.0 in | Wt 110.2 lb

## 2024-05-07 DIAGNOSIS — Z0000 Encounter for general adult medical examination without abnormal findings: Secondary | ICD-10-CM

## 2024-05-07 NOTE — Progress Notes (Addendum)
 Subjective:   [name] is a 88 y.o. who presents for a Medicare Wellness preventive visit.  As a reminder, Annual Wellness Visits don't include a physical exam, and some assessments may be limited, especially if this visit is performed virtually. We may recommend an in-person follow-up visit with your provider if needed.  Visit Complete: In person  Persons Participating in Visit: Patient.  AWV Questionnaire: No: Patient Medicare AWV questionnaire was not completed prior to this visit.  Cardiac Risk Factors include: advanced age (>80men, >19 women);hypertension;dyslipidemia     Objective:     Today's Vitals   05/07/24 1343  BP: 110/62  Pulse: 74  Weight: 110 lb 3.2 oz (50 kg)  Height: 5' (1.524 m)   Body mass index is 21.52 kg/m.     05/07/2024    1:40 PM 04/16/2024   11:09 AM 03/25/2024    6:09 PM 11/29/2023   11:17 AM 08/15/2023    2:16 PM 05/16/2023    1:52 PM 03/02/2023    9:06 AM  Advanced Directives  Does Patient Have a Medical Advance Directive? Yes Yes No Yes Yes Yes Yes  Type of Estate agent of [place];Living will Healthcare Power of [employer] of [place];Living will Healthcare Power of [employer] of [place];Living will Healthcare Power of [place];Living will  Does patient want to make changes to medical advance directive? No - Patient declined No - Patient declined   No - Patient declined  No - Patient declined  Copy of Healthcare Power of Attorney in Chart? Yes - validated most recent copy scanned in chart (See row information) No - copy requested   No - copy requested  Yes - validated most recent copy scanned in chart (See row information)    Current Medications (verified) Outpatient Encounter Medications as of 05/07/2024  Medication Sig   acetaminophen  (TYLENOL ) 500 MG tablet Take 1,000 mg by mouth every 6 (six) hours as needed for mild pain.   albuterol  (VENTOLIN  HFA) 108 (90 Base) MCG/ACT inhaler Take  2 puffs every 4-6 hours as needed   Ascorbic Acid (VITAMIN C ) 100 MG tablet Take 1 tablet (100 mg total) by mouth daily.   azelastine  (ASTELIN ) 0.1 % nasal spray 1 spray in each nostril Nasally Once a day in the evening   Azelastine -Fluticasone  137-50 MCG/ACT SUSP USE ONE SPRAY IN EACH NOSTRIL EVERY TWELVE HOURS   Calcium  Carbonate-Vitamin D  (CALCIUM  CARBONATE W/VITAMIN D  PO) Take 1 tablet by mouth daily.   cyanocobalamin  (VITAMIN B12) 1000 MCG tablet Take 1 tablet (1,000 mcg total) by mouth daily.   desonide  (DESOWEN ) 0.05 % cream Apply 1 application topically as needed.   EPINEPHrine  (EPIPEN  2-PAK) 0.3 mg/0.3 mL IJ SOAJ injection Inject 0.3 mLs (0.3 mg total) into the muscle as needed.   erythromycin  ophthalmic ointment Place 1 Application into both eyes as needed.   famotidine  (PEPCID ) 40 MG tablet Take 40 mg by mouth as needed for heartburn or indigestion.   hydrocortisone  (ANUSOL -HC) 2.5 % rectal cream Place 1 Application rectally as needed for hemorrhoids or anal itching.   ipratropium (ATROVENT ) 0.06 % nasal spray Place 2 sprays into both nostrils 2 (two) times daily as needed for rhinitis.   levocetirizine (XYZAL ) 5 MG tablet Take 1 tablet (5 mg total) by mouth every evening.   levothyroxine  (SYNTHROID ) 50 MCG tablet TAKE ONE TABLET BY MOUTH DAILY BEFORE BREAKFAST   LORazepam  (ATIVAN ) 1 MG tablet Take 2-3 tablets at bedtime and 1-2 tablets in  the daytime as needed.   MegaRed Omega-3 Krill Oil 500 MG CAPS Take 1 capsule by mouth every morning.   memantine  (NAMENDA  XR) 14 MG CP24 24 hr capsule Take 1 capsule (14 mg total) by mouth at bedtime.   mirabegron  ER (MYRBETRIQ ) 25 MG TB24 tablet Take 1 tablet (25 mg total) by mouth daily.   montelukast  (SINGULAIR ) 10 MG tablet Take 1 tablet (10 mg total) by mouth at bedtime.   pantoprazole  (PROTONIX ) 40 MG tablet TAKE ONE TABLET BY MOUTH TWICE DAILY BEFORE MEALS   polyethylene glycol powder (GLYCOLAX /MIRALAX ) 17 GM/SCOOP powder Take 17 g by mouth 2  (two) times daily as needed for moderate constipation.   psyllium (METAMUCIL) 58.6 % packet Take 1 packet by mouth as needed.   triamcinolone  ointment (KENALOG ) 0.1 % Apply 1 Application topically 3 (three) times daily. As needed for itching   UNABLE TO FIND Med Name: CBD oil - under tongue   amLODipine  (NORVASC ) 2.5 MG tablet Take 2.5 mg by mouth as needed. (Patient not taking: Reported on 04/06/2024)   [DISCONTINUED] benzonatate  (TESSALON ) 100 MG capsule Take 1 capsule (100 mg total) by mouth 3 (three) times daily as needed for cough.   [DISCONTINUED] mupirocin  ointment (BACTROBAN ) 2 % apply to leg wound with dressing change once or twice a day   No facility-administered encounter medications on file as of 05/07/2024.    Allergies (verified) Bee venom, Bacitracin -polymyxin b, Clarithromycin, Diclofenac , Doxycycline , Neosporin [neomycin -bacitracin  zn-polymyx], and Hydrocortisone    History: Past Medical History:  Diagnosis Date   Allergic rhinitis due to animal (cat) (dog) hair and dander 08/07/2021   Allergic rhinitis due to pollen 08/07/2021   Aortic valve sclerosis 03/12/2015   Atrophic gastritis 09/03/2008   Atrophic vaginitis    Baker's cyst    Left-Dr. Aluisio   Bladder pain 08/19/2014   Chronic  Take Vesicare  10 mg/d  UA was ok  On Premarin cream PV per GYN  On Vesicare   F/u w/Dr Haywood Lisle     Cataract    Dr. Candi Chafe   Cervical spinal cord compression 05/04/2021   Cervical spine instability 05/15/2021   Dr. Nigel Bart: plan -  posterior C1-2 decompression and instrumented fusion.  MRI: Progressive soft tissue pannus at C1-2 is now creating mass  effect on the craniocervical junction with distortion of the upper  spinal cord. This is likely related rheumatoid arthritis  C spine CT: Fracture of the anterior arch of C1 with 12 mm separation. Nondisplaced fracture posterior arch of C1. There is later   Cervical spondylosis 01/05/2019   Chalazion left upper eyelid 04/18/2023   X2  Keflex  po -  too big... Will use Ceftin   F/u w/Ophthalmology   CHF (congestive heart failure)    Cholelithiasis 09/09/2019   Per Dr Elvin Hammer: At this point we mutually decided to watch her abdominal complaints.  If symptoms accelerate or become more classic for symptomatic cholelithiasis, then she is agreeable to surgical referral.  She will keep me posted   Chronic left SI joint pain 11/30/2022   Closed fracture of first cervical vertebra 08/07/2021   Closed fracture of second cervical vertebra 08/07/2021   Concussion 10/29/2021   s/p a hard fall after she rushed and slipped on the wet marble floor while visiting family in [place]  a couple week ago.  She hit the side of her skull, had a skin laceration bled quite a bit.  She was dazed and according to her son [name] had a loss of consciousness of under a  minute duration.  She felt dazed.  She was taken to ER.  2 staples were applied to her skin laceration on the left o   Constipation 11/11/2016   Chronic, multifactorial  Miralax  prn  7/22 Postop constipation related to oxycodone  and Robaxin .  We can discontinue oxycodone  Robaxin .  The patient will use tramadol  and Tylenol  instead.  Use MiraLAX  or Senokot as to produce soft regular stools.  Discontinue Pepto-Bismol.  I think this should help with abdominal bloating complaint.      Take Metamucil daily  Senokot as to produce soft regular sto   Contact dermatitis and eczema 06/14/2018   Eczema vs other - 7/19  Cortaid prn  Depo-medrol  IM 80 mg  Remove nail polish     Contusion of left chest wall 01/09/2015   Delores Fester fell and broke a rib #8 on the L last week, she had a rib X ray and a CT (Dr Jinger Mount).   Contusion of right knee 08/12/2016   R knee   Degenerative disc disease, cervical 03/11/2021   Duodenitis 09/03/2008   Essential hypertension, benign 07/27/2011   Chronic, mild  NAS diet  Cardiac CT scan for calcium  scoring offered 1/20  1/24 HTN and s/p ER visit on 12/08/22 for SBP 180. Per Dr Monnie Anthony:  Discussed with  the patient that her restarting donepezil  could have impacted her symptoms today.  However, her blood pressure continues to be mildly elevated.  Will place her on 2.5 mg amlodipine  to take if her blood pressure is over 170/100.  Nursing aide at   Fatigue 09/08/2014   10/15, 2/16, 9/17, 2/18  No other sx  Evalin is managing it with short rest periods 1-2/d     Fibroid    Gait disorder 02/28/2022   Worse  Multifactorial  Cont w/PT  In the balance class  Balance class q 1 week     [name] resolved after doing exercises by Dr Nolene Baumgarten 11/2021     Generalized anxiety disorder 08/22/2018   Chronic   Lexapro  - not taking  Weighted blanket  Lorazepam  prn, - tolerance has developed   Potential benefits of a long term benzodiazepines  use as well as potential risks  and complications were explained to the patient and were aknowledged.     GERD (gastroesophageal reflux disease) 11/20/2007   Chronic. Better on Gluten free diet  Dexilant    Potential benefits of a long term PPI  use as well as potential risks  and complications were explained to the patient and were aknowledged.  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     History of basal cell carcinoma 09/29/2022   History of colonic polyps 09/04/2008   Hyperlipemia, mixed 03/15/2011   Chronic  On Simvastatin      Hypotension 07/13/2023   Stop Amlodipine      Hypothyroidism    Impacted cerumen of right ear 07/16/2013   Increased frequency of urination 08/19/2014   Inflammation of sacroiliac joint 11/16/2022   Insomnia 01/24/2013   Lorazepam  prn   Potential benefits of a long term benzodiazepines  use as well as potential risks  and complications were explained to the patient and were aknowledged.  Irritable bowel syndrome 09/04/2008   Chronic   Elvin Hammer MD, [facility] [place]  2013 -  much better on gluten free diet              Lactose intolerance 11/20/2007   Lactaid        Lumbar degenerative disc disease 12/21/2022   Based on MRI and her exam there is significant DDD in lumbar spine     Lumbar spondylosis    Melanocytic nevi of trunk 02/22/2022   Meningioma 03/25/2020   Dr Nigel Bart     Mild cognitive impairment with memory loss 09/15/2023   Mild intermittent asthma 08/07/2021   Multiple thyroid  nodules 10/04/2011   2013 she was started on Synthroid  - tol well  2017, 2022 US  OK     MVP (mitral valve prolapse)    Antibiotics required for dental procedures   Neck pain 12/26/2018   Chronic  PT, traction, TENs unit was offered  Tramadol  prn   Potential benefits of a long term opioids use as well as potential risks (i.e. addiction risk, apnea etc) and complications (i.e. Somnolence, constipation and others) were explained to the patient and were aknowledged.        OAB (overactive bladder) 09/02/2010   Dr Nolon Baxter  Use Solifenacin  prn  Worse - ?multifactorial  UA was normal  Abraham Hoffmann increased Memantine  to 10 mg bid a month ago     Osteoarthritis 02/25/2008   Dr Rexanne Catalina  Blue-Emu cream was recommended to use 2-3 times a day        Osteoporosis 11/20/2007   Chronic  Per GYN        Overactive bladder    Pain of right sacroiliac joint 01/14/2023   Postherpetic neuralgia 10/02/2013   Pruritus of skin 03/11/2012   S/p H zoster vaccination  Relapsing  8/12 - on face  8/14 - R ear/face  10/14 ?     Sacroiliitis    Seborrheic dermatitis 02/22/2022   Sinusitis, chronic 08/06/2016   Worse  Flonase , Atrovent  nasal, Singulair , Claritin   ENT ref Dr Jodelle Mungo  CT sinuses     Skin ulcer of nose 07/13/2023   Soft pads on the nose  Derm appt     Spondylolisthesis, cervical region 03/11/2021   Stool incontinence 11/16/2022   12/23  Worse.  Stool leakage - new  D/c Aricept   Abd X ray to rule out constipation     Tibialis tendinitis 03/20/2008   Torn meniscus    bilateral   Trochanteric  bursitis of right hip 09/22/2020   Upper respiratory infection 08/03/2012   9/13 - 3 wks  2/17  11/18 - asthmatic bronchitis     Urinary urgency 08/19/2014   2023 worse.  Obtain urinalysis.  D/c Aricept   UA was ok  On Premarin cream PV per GYN  On Vesicare   F/u w/Dr Haywood Lisle     Urticaria 02/22/2022   Past Surgical History:  Procedure Laterality Date   CATARACT EXTRACTION, BILATERAL     EYE SURGERY     POSTERIOR CERVICAL FUSION/FORAMINOTOMY N/A 05/15/2021   Procedure: Cervical One Laminectomy with resection of cyst, Fixation from Occiput to Cervical Four;  Surgeon: Manya Sells, MD;  Location[facility];  Service: Neurosurgery;  Laterality: N/A;   TONSILLECTOMY     Family History  Problem Relation Age of Onset   Heart disease Mother    Hypertension Mother    Osteoporosis Mother    Congestive Heart Failure Mother    Pancreatic cancer Father  Heart attack Brother    Drug abuse Brother        over dose    Healthy Son    Healthy Son    Colon cancer Neg Hx    Colon polyps Neg Hx    Rectal cancer Neg Hx    Stomach cancer Neg Hx    Breast cancer Neg Hx    Social History   Socioeconomic History   Marital status: Married    Spouse name: Not on file   Number of children: 2   Years of education: 15   Highest education level: Some college, no degree  Occupational History   Occupation: retired    Associate Professor: RETIRED  Tobacco Use   Smoking status: Former    Current packs/day: 0.00    Types: Cigarettes    Start date: 1963    Quit date: 1973    Years since quitting: 52.4    Passive exposure: Never   Smokeless tobacco: Never   Tobacco comments:    Former smoker 06/25/22  Vaping Use   Vaping status: Never Used  Substance and Sexual Activity   Alcohol use: Yes    Alcohol/week: 1.0 standard drink of alcohol    Types: 1 Glasses of wine per week    Comment: Socially   Drug use: No   Sexual activity: Not Currently    Birth control/protection: Post-menopausal, None    Comment: 1st  intercourse 88 yo-Fewer than 5 partners  Other Topics Concern   Not on file  Social History Narrative   Regular exercise-yesDaily caffeine use   Right handed   Lives alone   retired   Social Drivers of Corporate investment banker Strain: Low Risk  (05/07/2024)   Overall Financial Resource Strain (CARDIA)    Difficulty of Paying Living Expenses: Not hard at all  Food Insecurity: No Food Insecurity (05/07/2024)   Hunger Vital Sign    Worried About Running Out of Food in the Last Year: Never true    Ran Out of Food in the Last Year: Never true  Transportation Needs: No Transportation Needs (05/07/2024)   PRAPARE - Administrator, Civil Service (Medical): No    Lack of Transportation (Non-Medical): No  Physical Activity: Sufficiently Active (05/07/2024)   Exercise Vital Sign    Days of Exercise per Week: 5 days    Minutes of Exercise per Session: 30 min  Stress: No Stress Concern Present (05/07/2024)   [employer] of Occupational Health - Occupational Stress Questionnaire    Feeling of Stress : Not at all  Social Connections: Moderately Integrated (05/07/2024)   Social Connection and Isolation Panel [NHANES]    Frequency of Communication with Friends and Family: Twice a week    Frequency of Social Gatherings with Friends and Family: Twice a week    Attends Religious Services: More than 4 times per year    Active Member of [employer] or Organizations: Yes    Attends Banker Meetings: More than 4 times per year    Marital Status: Widowed    Tobacco Counseling Counseling given: No Tobacco comments: Former smoker 06/25/22    Clinical Intake:  Pre-visit preparation completed: Yes  Pain : No/denies pain     BMI - recorded: 21.52 Nutritional Status: BMI of 19-24  Normal Nutritional Risks: None Diabetes: No  Lab Results  Component Value Date   HGBA1C 5.5 06/13/2020   HGBA1C 5.6 03/14/2020   HGBA1C 6.0 07/08/2016     How often  do you need to have  someone help you when you read instructions, pamphlets, or other written materials from your doctor or pharmacy?: 1 - Never  Interpreter Needed?: No  Information entered by :: Kandy Orris, CMA   Activities of Daily Living     05/07/2024    1:45 PM  In your present state of health, do you have any difficulty performing the following activities:  Hearing? 0  Vision? 0  Difficulty concentrating or making decisions? 0  Walking or climbing stairs? 0  Dressing or bathing? 0  Doing errands, shopping? 0  Preparing Food and eating ? N  Using the Toilet? N  In the past six months, have you accidently leaked urine? N  Do you have problems with loss of bowel control? N  Managing your Medications? N  Managing your Finances? N  Housekeeping or managing your Housekeeping? N    Patient Care Team: Plotnikov, Oakley Bellman, MD as PCP - General (Internal Medicine) Bensimhon, Rheta Celestine, MD as PCP - Advanced Heart Failure (Cardiology) Tobin Forts, MD (Gastroenterology) Zula Hitch, MD as Consulting Physician (Ophthalmology) Nicholas Bari, MD as Consulting Physician (Rheumatology) Manya Sells, MD as Consulting Physician (Neurosurgery) Dawley, Colby Daub, DO as Consulting Physician Thais Fill, MD as Consulting Physician (Dermatology)  I have updated your Care Teams any recent Medical Services you may have received from other providers in the past year.     Assessment:    This is a routine wellness examination for [name].  Hearing/Vision screen Hearing Screening - Comments:: Wears hearing aids Vision Screening - Comments:: Wears rx glasses - up to date with routine eye exams with Dr Joanne Muckle   Goals Addressed               This Visit's Progress     Patient Stated (pt-stated)        Patient stated she plans to continue to exercise and manage the arthritis discomfort       Depression Screen     05/07/2024    1:47 PM 04/02/2024    3:05 PM 03/19/2024   10:59 AM 02/24/2024   11:39 AM  12/21/2023   10:35 AM 09/20/2023    1:54 PM 08/25/2023   10:23 AM  PHQ 2/9 Scores  PHQ - 2 Score 0 2 0 0 0 0 0  PHQ- 9 Score 3 11         Fall Risk     05/07/2024    1:46 PM 04/16/2024   11:08 AM 03/19/2024   10:59 AM 02/24/2024   11:39 AM 12/21/2023   10:34 AM  Fall Risk   Falls in the past year? 0 0 1 0 1  Number falls in past yr: 0 0 0 0 1  Injury with Fall? 0 0 0 0 1  Risk for fall due to : No Fall Risks  History of fall(s) No Fall Risks Impaired balance/gait;Impaired vision  Follow up Falls evaluation completed;Falls prevention discussed Falls evaluation completed Falls evaluation completed Falls evaluation completed Falls evaluation completed    MEDICARE RISK AT HOME:  Medicare Risk at Home Any stairs in or around the home?: No If so, are there any without handrails?: No Home free of loose throw rugs in walkways, pet beds, electrical cords, etc?: Yes Adequate lighting in your home to reduce risk of falls?: Yes Life alert?: Yes Use of a cane, walker or w/c?: Yes (cane/walker) Grab bars in the bathroom?: Yes Shower chair or bench in shower?: Yes Elevated toilet  seat or a handicapped toilet?: Yes  TIMED UP AND GO:  Was the test performed?  No  Cognitive Function: 6CIT completed    04/16/2024   12:00 PM 02/10/2017    1:39 PM  MMSE - Mini Mental State Exam  Orientation to time 4 5  Orientation to Place 5 5  Registration 3 3  Attention/ Calculation 5 5  Recall 2 2  Language- name 2 objects 2 2  Language- repeat 1 1  Language- follow 3 step command 3 3  Language- read & follow direction 1 1  Write a sentence 1 1  Copy design 1 1  Total score 28 29      11/08/2022    7:00 PM  Montreal Cognitive Assessment   Visuospatial/ Executive (0/5) 1  Naming (0/3) 1  Attention: Read list of digits (0/2) 2  Attention: Read list of letters (0/1) 1  Attention: Serial 7 subtraction starting at 100 (0/3) 3  Language: Repeat phrase (0/2) 2  Language : Fluency (0/1) 1   Abstraction (0/2) 1  Delayed Recall (0/5) 3  Orientation (0/6) 6  Total 21  Adjusted Score (based on education) 22      05/07/2024    1:51 PM 03/02/2023    9:09 AM 06/18/2020   10:21 AM  6CIT Screen  What Year? 0 points 0 points 0 points  What month? 0 points 0 points 0 points  What time? 0 points 0 points 0 points  Count back from 20 0 points 0 points 0 points  Months in reverse 0 points 0 points 0 points  Repeat phrase 2 points 0 points 0 points  Total Score 2 points 0 points 0 points    Immunizations Immunization History  Administered Date(s) Administered   Fluad Quad(high Dose 65+) 08/04/2019, 09/16/2021, 09/12/2022   Fluad Trivalent(High Dose 65+) 09/01/2023   Influenza Whole 09/29/2007, 08/21/2008, 09/02/2010, 07/30/2012   Influenza, High Dose Seasonal PF 09/03/2013, 02/19/2015, 11/12/2015, 08/06/2016, 11/11/2016, 08/20/2017, 08/31/2017, 09/20/2018, 09/12/2019   Influenza,inj,Quad PF,6+ Mos 12/17/2014, 07/30/2015   PFIZER(Purple Top)SARS-COV-2 Vaccination 12/12/2019, 12/31/2019, 07/29/2020, 07/29/2020, 07/31/2020, 09/10/2020   Pfizer Covid-19 Vaccine Bivalent Booster 69yrs & up 08/21/2021   Pfizer(Comirnaty)Fall Seasonal Vaccine 12 years and older 09/29/2022   Pneumococcal Conjugate-13 01/09/2014   Pneumococcal Polysaccharide-23 09/26/2006, 08/12/2015, 11/11/2016, 08/31/2017, 09/10/2020, 09/10/2021   Td 04/14/2010   Tdap 06/18/2020   Typhoid Inactivated 02/23/2013   Zoster, Live 12/09/2006    Screening Tests Health Maintenance  Topic Date Due   COVID-19 Vaccine (9 - 2024-25 season) 07/31/2023   Cervical Cancer Screening (Pap smear)  08/08/2023   INFLUENZA VACCINE  06/29/2024   Medicare Annual Wellness (AWV)  05/07/2025   DTaP/Tdap/Td (3 - Td or Tdap) 06/18/2030   Pneumonia Vaccine 67+ Years old  Completed   DEXA SCAN  Completed   HPV VACCINES  Aged Out   Meningococcal B Vaccine  Aged Out   Zoster Vaccines- Shingrix  Discontinued    Health  Maintenance  Health Maintenance Due  Topic Date Due   COVID-19 Vaccine (9 - 2024-25 season) 07/31/2023   Cervical Cancer Screening (Pap smear)  08/08/2023   Health Maintenance Items Addressed: 05/07/2024   Additional Screening:  Vision Screening: Recommended annual ophthalmology exams for early detection of glaucoma and other disorders of the eye. Would you like a referral to an eye doctor? No    Dental Screening: Recommended annual dental exams for proper oral hygiene  Community Resource Referral / Chronic Care Management: CRR required this visit?  No  CCM required this visit?  No   Plan:    I have personally reviewed and noted the following in the patient's chart:   Medical and social history Use of alcohol, tobacco or illicit drugs  Current medications and supplements including opioid prescriptions. Patient is not currently taking opioid prescriptions. Functional ability and status Nutritional status Physical activity Advanced directives List of other physicians Hospitalizations, surgeries, and ER visits in previous 12 months Vitals Screenings to include cognitive, depression, and falls Referrals and appointments  In addition, I have reviewed and discussed with patient certain preventive protocols, quality metrics, and best practice recommendations. A written personalized care plan for preventive services as well as general preventive health recommendations were provided to patient.   Patria Bookbinder, CMA   05/07/2024   After Visit Summary: (In Person-Printed) AVS printed and given to the patient  Notes: Nothing significant to report at this time.   Medical screening examination/treatment/procedure(s) were performed by non-physician practitioner and as supervising physician I was immediately available for consultation/collaboration.  I agree with above. Adelaide Holy, MD

## 2024-05-07 NOTE — Patient Instructions (Signed)
 [name] , Thank you for taking time out of your busy schedule to complete your Annual Wellness Visit with me. I enjoyed our conversation and look forward to speaking with you again next year. I, as well as your care team,  appreciate your ongoing commitment to your health goals. Please review the following plan we discussed and let me know if I can assist you in the future. Your Game plan/ To Do List   Follow up Visits: Next Medicare AWV with our clinical staff: 05/13/2025  at 1:00pm Have you seen your provider in the last 6 months (3 months if uncontrolled diabetes)? Yes Next Office Visit with your provider: 07/02/2024 at 3:20p  Clinician Recommendations:  Aim for 30 minutes of exercise or brisk walking, 6-8 glasses of water, and 5 servings of fruits and vegetables each day.       This is a list of the screening recommended for you and due dates:  Health Maintenance  Topic Date Due   COVID-19 Vaccine (9 - 2024-25 season) 07/31/2023   Pap Smear  08/08/2023   Flu Shot  06/29/2024   Medicare Annual Wellness Visit  05/07/2025   DTaP/Tdap/Td vaccine (3 - Td or Tdap) 06/18/2030   Pneumonia Vaccine  Completed   DEXA scan (bone density measurement)  Completed   HPV Vaccine  Aged Out   Meningitis B Vaccine  Aged Out   Zoster (Shingles) Vaccine  Discontinued    Advanced directives: (In Chart) A copy of your advanced directives are scanned into your chart should your provider ever need it. Advance Care Planning is important because it:  [x]  Makes sure you receive the medical care that is consistent with your values, goals, and preferences  [x]  It provides guidance to your family and loved ones and reduces their decisional burden about whether or not they are making the right decisions based on your wishes.  Follow the link provided in your after visit summary or read over the paperwork we have mailed to you to help you started getting your Advance Directives in place. If you need assistance in  completing these, please reach out to us  so that we can help you!

## 2024-05-08 DIAGNOSIS — R351 Nocturia: Secondary | ICD-10-CM | POA: Diagnosis not present

## 2024-05-08 DIAGNOSIS — K5900 Constipation, unspecified: Secondary | ICD-10-CM | POA: Diagnosis not present

## 2024-05-08 DIAGNOSIS — R3915 Urgency of urination: Secondary | ICD-10-CM | POA: Diagnosis not present

## 2024-05-09 NOTE — Progress Notes (Signed)
 Office Visit Note  Patient: [name]             Date of Birth: 01/09/1936           MRN: [id number]             PCP: Genia Kettering, MD Referring: Genia Kettering, MD Visit Date: 05/16/2024 Occupation: @GUAROCC @  Subjective:  Pain in multiple joints  History of Present Illness: [name] is a 88 y.o. female with osteoarthritis, degenerative disc disease and osteoporosis.  She states that she has been experiencing increased pain and discomfort over the last 2 weeks.  She describes discomfort in her neck and trapezius region.  She also has discomfort in her lower back in the right trochanteric region.  She has discomfort in other joints but it is not as severe.  Has been taking Tylenol  on as needed basis.  She states that the rainy weather the pain has increased.  She uses Voltaren  gel as needed.    Activities of Daily Living:  Patient reports morning stiffness for 30 minutes.   Patient Reports nocturnal pain.  Difficulty dressing/grooming: Denies Difficulty climbing stairs: Denies Difficulty getting out of chair: Denies Difficulty using hands for taps, buttons, cutlery, and/or writing: Denies  Review of Systems  Constitutional:  Negative for fatigue.  HENT:  Positive for mouth dryness. Negative for mouth sores.   Eyes:  Negative for dryness.  Respiratory:  Negative for shortness of breath.   Cardiovascular:  Negative for chest pain and palpitations.  Gastrointestinal:  Negative for blood in stool, constipation and diarrhea.  Endocrine: Positive for increased urination.  Genitourinary:  Negative for involuntary urination.  Musculoskeletal:  Positive for joint pain, joint pain, joint swelling, myalgias, morning stiffness, muscle tenderness and myalgias. Negative for gait problem and muscle weakness.  Skin:  Negative for color change, rash, hair loss and sensitivity to sunlight.  Allergic/Immunologic: Negative for susceptible to infections.  Neurological:   Negative for dizziness and headaches.  Hematological:  Negative for swollen glands.  Psychiatric/Behavioral:  Negative for depressed mood and sleep disturbance. The patient is not nervous/anxious.     PMFS History:  Patient Active Problem List   Diagnosis Date Noted   Spasm of right trapezius muscle 05/03/2024   Postnasal drip 04/30/2024   Mild cognitive impairment with memory loss [G31.84] 04/16/2024   Abrasion of skin 04/09/2024   Episode of confusion 04/02/2024   Allergic rhinitis 01/24/2024   Wheezing 01/24/2024   Statin intolerance 01/09/2024   Chronic low back pain 11/28/2023   Acute pain of right shoulder 11/28/2023   Dyslipidemia 11/28/2023   Alzheimer's dementia (HCC) 09/15/2023   CHF (congestive heart failure)    Hypotension 07/13/2023   Skin ulcer of nose 07/13/2023   Chalazion left upper eyelid 04/18/2023   Bruising 04/06/2023   Pain of right sacroiliac joint 01/14/2023   Lumbar degenerative disc disease 12/21/2022   Chronic left SI joint pain 11/30/2022   Inflammation of sacroiliac joint 11/16/2022   Stool incontinence 11/16/2022   Grief 10/06/2022   History of basal cell carcinoma 09/29/2022   Stress at home 07/21/2022   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   Gait disorder 02/28/2022   Melanocytic nevi of trunk 02/22/2022   Seborrheic dermatitis 02/22/2022   Urticaria 02/22/2022   Concussion 10/29/2021   Allergic rhinitis due to animal (cat) (dog) hair and dander 08/07/2021   Allergic rhinitis due to pollen 08/07/2021   Mild intermittent asthma 08/07/2021   Cervical spine instability 05/15/2021  Cervical spinal cord compression 05/04/2021   Degenerative disc disease, cervical 03/11/2021   Spondylolisthesis, cervical region 03/11/2021   Trochanteric bursitis of right hip 09/22/2020   Meningioma 03/25/2020   Cholelithiasis 09/09/2019   Cervical spondylosis 01/05/2019   Neck pain 12/26/2018   Generalized anxiety disorder 08/22/2018   Contact dermatitis  and eczema 06/14/2018   Constipation 11/11/2016   Sinusitis, chronic 08/06/2016   Rash 04/20/2016   Aortic valve sclerosis 03/12/2015   Fatigue 09/08/2014   Increased frequency of urination 08/19/2014   Urinary urgency 08/19/2014   Postherpetic neuralgia 10/02/2013   Cerumen impaction 07/16/2013   Insomnia 01/24/2013   Upper respiratory infection 08/03/2012   Pruritus of skin 03/11/2012   MVP (mitral valve prolapse)    Multiple thyroid  nodules 10/04/2011   Herpes zoster 09/07/2011   Essential hypertension, benign 07/27/2011   Atrophic vaginitis    Hyperlipemia, mixed 03/15/2011   OAB (overactive bladder) 09/02/2010   Hypothyroidism 03/03/2010   Irritable bowel syndrome 09/04/2008   History of colonic polyps 09/04/2008   Atrophic gastritis 09/03/2008   Duodenitis 09/03/2008   Tibialis tendinitis 03/20/2008   Osteoarthritis 02/25/2008   Lactose intolerance 11/20/2007   GERD (gastroesophageal reflux disease) 11/20/2007   Osteoporosis 11/20/2007    Past Medical History:  Diagnosis Date   Allergic rhinitis due to animal (cat) (dog) hair and dander 08/07/2021   Allergic rhinitis due to pollen 08/07/2021   Aortic valve sclerosis 03/12/2015   Atrophic gastritis 09/03/2008   Atrophic vaginitis    Baker's cyst    Left-Dr. France Ina   Bladder pain 08/19/2014   Chronic  Take Vesicare  10 mg/d  UA was ok  On Premarin cream PV per GYN  On Vesicare   F/u w/Dr Haywood Lisle     Cataract    Dr. Candi Chafe   Cervical spinal cord compression 05/04/2021   Cervical spine instability 05/15/2021   Dr. Nigel Bart: plan -  posterior C1-2 decompression and instrumented fusion.  MRI: Progressive soft tissue pannus at C1-2 is now creating mass  effect on the craniocervical junction with distortion of the upper  spinal cord. This is likely related rheumatoid arthritis  C spine CT: Fracture of the anterior arch of C1 with 12 mm separation. Nondisplaced fracture posterior arch of C1. There is later   Cervical  spondylosis 01/05/2019   Chalazion left upper eyelid 04/18/2023   X2  Keflex  po - too big... Will use Ceftin   F/u w/Ophthalmology   CHF (congestive heart failure)    Cholelithiasis 09/09/2019   Per Dr Elvin Hammer: At this point we mutually decided to watch her abdominal complaints.  If symptoms accelerate or become more classic for symptomatic cholelithiasis, then she is agreeable to surgical referral.  She will keep me posted   Chronic left SI joint pain 11/30/2022   Closed fracture of first cervical vertebra 08/07/2021   Closed fracture of second cervical vertebra 08/07/2021   Concussion 10/29/2021   s/p a hard fall after she rushed and slipped on the wet marble floor while visiting family in [place]  a couple week ago.  She hit the side of her skull, had a skin laceration bled quite a bit.  She was dazed and according to her son [name] had a loss of consciousness of under a minute duration.  She felt dazed.  She was taken to ER.  2 staples were applied to her skin laceration on the left o   Constipation 11/11/2016   Chronic, multifactorial  Miralax  prn  7/22 Postop constipation related to oxycodone   and Robaxin .  We can discontinue oxycodone  Robaxin .  The patient will use tramadol  and Tylenol  instead.  Use MiraLAX  or Senokot as to produce soft regular stools.  Discontinue Pepto-Bismol.  I think this should help with abdominal bloating complaint.      Take Metamucil daily  Senokot as to produce soft regular sto   Contact dermatitis and eczema 06/14/2018   Eczema vs other - 7/19  Cortaid prn  Depo-medrol  IM 80 mg  Remove nail polish     Contusion of left chest wall 01/09/2015   Delores Fester fell and broke a rib #8 on the L last week, she had a rib X ray and a CT (Dr Jinger Mount).   Contusion of right knee 08/12/2016   R knee   Degenerative disc disease, cervical 03/11/2021   Duodenitis 09/03/2008   Essential hypertension, benign 07/27/2011   Chronic, mild  NAS diet  Cardiac CT scan for calcium  scoring offered  1/20  1/24 HTN and s/p ER visit on 12/08/22 for SBP 180. Per Dr Monnie Anthony:  Discussed with the patient that her restarting donepezil  could have impacted her symptoms today.  However, her blood pressure continues to be mildly elevated.  Will place her on 2.5 mg amlodipine  to take if her blood pressure is over 170/100.  Nursing aide at   Fatigue 09/08/2014   10/15, 2/16, 9/17, 2/18  No other sx  Khushbu is managing it with short rest periods 1-2/d     Fibroid    Gait disorder 02/28/2022   Worse  Multifactorial  Cont w/PT  In the balance class  Balance class q 1 week     [name] resolved after doing exercises by Dr Nolene Baumgarten 11/2021     Generalized anxiety disorder 08/22/2018   Chronic   Lexapro  - not taking  Weighted blanket  Lorazepam  prn, - tolerance has developed   Potential benefits of a long term benzodiazepines  use as well as potential risks  and complications were explained to the patient and were aknowledged.     GERD (gastroesophageal reflux disease) 11/20/2007   Chronic. Better on Gluten free diet  Dexilant    Potential benefits of a long term PPI  use as well as potential risks  and complications were explained to the patient and were aknowledged.  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     History of basal cell carcinoma 09/29/2022   History of colonic polyps 09/04/2008   Hyperlipemia, mixed 03/15/2011   Chronic  On Simvastatin      Hypotension 07/13/2023   Stop Amlodipine      Hypothyroidism    Impacted cerumen of right ear 07/16/2013   Increased frequency of urination 08/19/2014   Inflammation of sacroiliac joint 11/16/2022   Insomnia 01/24/2013   Lorazepam  prn   Potential benefits of a long term benzodiazepines  use as well as potential risks  and complications were explained to the patient and were  aknowledged.        Irritable bowel syndrome 09/04/2008   Chronic   Elvin Hammer MD, [place]  2013 - much better on gluten free diet              Lactose intolerance 11/20/2007   Lactaid  Lumbar degenerative disc disease 12/21/2022   Based on MRI and her exam there is significant DDD in lumbar spine     Lumbar spondylosis    Melanocytic nevi of trunk 02/22/2022   Meningioma 03/25/2020   Dr Nigel Bart     Mild cognitive impairment with memory loss 09/15/2023   Mild intermittent asthma 08/07/2021   Multiple thyroid  nodules 10/04/2011   2013 she was started on Synthroid  - tol well  2017, 2022 US  OK     MVP (mitral valve prolapse)    Antibiotics required for dental procedures   Neck pain 12/26/2018   Chronic  PT, traction, TENs unit was offered  Tramadol  prn   Potential benefits of a long term opioids use as well as potential risks (i.e. addiction risk, apnea etc) and complications (i.e. Somnolence, constipation and others) were explained to the patient and were aknowledged.        OAB (overactive bladder) 09/02/2010   Dr Nolon Baxter  Use Solifenacin  prn  Worse - ?multifactorial  UA was normal  Abraham Hoffmann increased Memantine  to 10 mg bid a month ago     Osteoarthritis 02/25/2008   Dr Rexanne Catalina  Blue-Emu cream was recommended to use 2-3 times a day        Osteoporosis 11/20/2007   Chronic  Per GYN        Overactive bladder    Pain of right sacroiliac joint 01/14/2023   Postherpetic neuralgia 10/02/2013   Pruritus of skin 03/11/2012   S/p H zoster vaccination  Relapsing  8/12 - on face  8/14 - R ear/face  10/14 ?     Sacroiliitis    Seborrheic dermatitis 02/22/2022   Sinusitis, chronic 08/06/2016   Worse  Flonase , Atrovent  nasal, Singulair , Claritin   ENT ref Dr Jodelle Mungo  CT sinuses     Skin ulcer of nose 07/13/2023   Soft pads on the nose  Derm appt     Spondylolisthesis, cervical region 03/11/2021   Stool incontinence 11/16/2022   12/23  Worse.  Stool leakage - new  D/c Aricept   Abd X ray to rule out  constipation     Tibialis tendinitis 03/20/2008   Torn meniscus    bilateral   Trochanteric bursitis of right hip 09/22/2020   Upper respiratory infection 08/03/2012   9/13 - 3 wks  2/17  11/18 - asthmatic bronchitis     Urinary urgency 08/19/2014   2023 worse.  Obtain urinalysis.  D/c Aricept   UA was ok  On Premarin cream PV per GYN  On Vesicare   F/u w/Dr Haywood Lisle     Urticaria 02/22/2022    Family History  Problem Relation Age of Onset   Heart disease Mother    Hypertension Mother    Osteoporosis Mother    Congestive Heart Failure Mother    Pancreatic cancer Father    Heart attack Brother    Drug abuse Brother        over dose    Healthy Son    Healthy Son    Colon cancer Neg Hx    Colon polyps Neg Hx    Rectal cancer Neg Hx    Stomach cancer Neg Hx    Breast cancer Neg Hx    Past Surgical History:  Procedure Laterality Date   CATARACT EXTRACTION, BILATERAL     EYE SURGERY     POSTERIOR CERVICAL FUSION/FORAMINOTOMY N/A 05/15/2021   Procedure: Cervical One Laminectomy with resection of cyst, Fixation from Occiput to Cervical Four;  Surgeon: Manya Sells,  MD;  Location: [facility] OR;  Service: Neurosurgery;  Laterality: N/A;   TONSILLECTOMY     Social History   Social History Narrative   Regular exercise-yesDaily caffeine use   Right handed   Lives alone   retired   Immunization History  Administered Date(s) Administered   Fluad Quad(high Dose 65+) 08/04/2019, 09/16/2021, 09/12/2022   Fluad Trivalent(High Dose 65+) 09/01/2023   Influenza Whole 09/29/2007, 08/21/2008, 09/02/2010, 07/30/2012   Influenza, High Dose Seasonal PF 09/03/2013, 02/19/2015, 11/12/2015, 08/06/2016, 11/11/2016, 08/20/2017, 08/31/2017, 09/20/2018, 09/12/2019   Influenza,inj,Quad PF,6+ Mos 12/17/2014, 07/30/2015   PFIZER(Purple Top)SARS-COV-2 Vaccination 12/12/2019, 12/31/2019, 07/29/2020, 07/29/2020, 07/31/2020, 09/10/2020   Pfizer Covid-19 Vaccine Bivalent Booster 42yrs & up 08/21/2021    Pfizer(Comirnaty)Fall Seasonal Vaccine 12 years and older 09/29/2022   Pneumococcal Conjugate-13 01/09/2014   Pneumococcal Polysaccharide-23 09/26/2006, 08/12/2015, 11/11/2016, 08/31/2017, 09/10/2020, 09/10/2021   Td 04/14/2010   Tdap 06/18/2020   Typhoid Inactivated 02/23/2013   Zoster, Live 12/09/2006     Objective: Vital Signs: BP 135/77 (BP Location: Left Arm, Patient Position: Sitting, Cuff Size: Normal)   Pulse 81   Resp 16   Ht 5' (1.524 m)   Wt 113 lb 6.4 oz (51.4 kg)   BMI 22.15 kg/m    Physical Exam Vitals and nursing note reviewed.  Constitutional:      Appearance: She is well-developed.  HENT:     Head: Normocephalic and atraumatic.   Eyes:     Conjunctiva/sclera: Conjunctivae normal.    Cardiovascular:     Rate and Rhythm: Normal rate and regular rhythm.     Heart sounds: Normal heart sounds.  Pulmonary:     Effort: Pulmonary effort is normal.     Breath sounds: Normal breath sounds.  Abdominal:     General: Bowel sounds are normal.     Palpations: Abdomen is soft.   Musculoskeletal:     Cervical back: Normal range of motion.  Lymphadenopathy:     Cervical: No cervical adenopathy.   Skin:    General: Skin is warm and dry.     Capillary Refill: Capillary refill takes less than 2 seconds.   Neurological:     Mental Status: She is alert and oriented to person, place, and time.   Psychiatric:        Behavior: Behavior normal.      Musculoskeletal Exam: Patient had limited lateral rotation of the cervical spine.  She had limited range of motion of her thoracic and lumbar spine.  She had tenderness over bilateral SI joints and over the right trochanteric bursa.  Shoulders, elbows, wrist joints with good range of motion.  She had bilateral CMC PIP and DIP thickening.  No synovitis was noted.  Hip joints and knee joints with good range of motion.  There was no tenderness over ankles or MTPs.  CDAI Exam: CDAI Score: -- Patient Global: --; Provider  Global: -- Swollen: --; Tender: -- Joint Exam 05/16/2024   No joint exam has been documented for this visit   There is currently no information documented on the homunculus. Go to the Rheumatology activity and complete the homunculus joint exam.  Investigation: No additional findings.  Imaging: MM 3D SCREENING MAMMOGRAM BILATERAL BREAST Result Date: 04/19/2024 CLINICAL DATA:  Screening. EXAM: DIGITAL SCREENING BILATERAL MAMMOGRAM WITH TOMOSYNTHESIS AND CAD TECHNIQUE: Bilateral screening digital craniocaudal and mediolateral oblique mammograms were obtained. Bilateral screening digital breast tomosynthesis was performed. The images were evaluated with computer-aided detection. COMPARISON:  Previous exam(s). ACR Breast Density Category b: There are  scattered areas of fibroglandular density. FINDINGS: There are no findings suspicious for malignancy. IMPRESSION: No mammographic evidence of malignancy. A result letter of this screening mammogram will be mailed directly to the patient. RECOMMENDATION: Screening mammogram in one year. (Code:SM-B-01Y) BI-RADS CATEGORY  1: Negative. Electronically Signed   By: Anna Barnes M.D.   On: 04/19/2024 15:16    Recent Labs: Lab Results  Component Value Date   WBC 8.9 02/15/2024   HGB 13.3 02/15/2024   PLT 282.0 02/15/2024   NA 129 (L) 02/15/2024   K 3.9 02/15/2024   CL 95 (L) 02/15/2024   CO2 26 02/15/2024   GLUCOSE 109 (H) 02/15/2024   BUN 12 02/15/2024   CREATININE 0.52 02/15/2024   BILITOT 0.5 02/15/2024   ALKPHOS 136 (H) 02/15/2024   AST 23 02/15/2024   ALT 45 (H) 02/15/2024   PROT 7.6 02/15/2024   ALBUMIN  4.3 02/15/2024   CALCIUM  9.3 02/15/2024   GFRAA 87 07/17/2020    Speciality Comments: No specialty comments available.  Procedures:  Trigger Point Inj  Date/Time: 05/16/2024 2:07 PM  Performed by: Nicholas Bari, MD Authorized by: Nicholas Bari, MD   Consent Given by:  Patient Site marked: the procedure site was marked    Timeout: prior to procedure the correct patient, procedure, and site was verified   Indications:  Muscle spasm and pain Total # of Trigger Points:  2 Location: neck   Needle Size:  27 G Approach:  Dorsal Medications #1:  0.5 mL lidocaine  1 %; 20 mg triamcinolone  acetonide 40 MG/ML Medications #2:  0.5 mL lidocaine  1 %; 20 mg triamcinolone  acetonide 40 MG/ML Patient tolerance:  Patient tolerated the procedure well with no immediate complications Comments: Risk of infection, tendon injury, nerve injury, dermal atrophy and hypopigmentation were discussed.  Allergies: Bee venom, Bacitracin -polymyxin b, Clarithromycin, Diclofenac , Doxycycline , Neosporin [neomycin -bacitracin  zn-polymyx], and Hydrocortisone    Assessment / Plan:     Visit Diagnoses: Primary osteoarthritis of both hands-she has severe osteoarthritis in bilateral hands.  Bilateral PIP DIP and CMC thickening was noted.  Primary osteoarthritis of both feet-proper fitting shoes were advised.  Spasm of both trapezius muscles-she has been experiencing increased pain and discomfort in the bilateral trapezius region.  Last trigger point point injections were in December 2024 which gave her relief.  She requested repeat injections today.  After informed consent was obtained bilateral trapezius region was injected with lidocaine  and Kenalog  which she tolerated well.  Postprocedure instructions were given.  DDD (degenerative disc disease), cervical - Status post fusion by Dr. Nigel Bart.  She had very limited range of motion of the cervical spine.  Piriformis syndrome of both sides -she had some tenderness over the right piriformis region today.  She states that she will try doing the stretches.  If her pain persist she will contact us  for cortisone injection.  Degeneration of intervertebral disc of lumbar region without discogenic back pain or lower extremity pain - Grade 1 anterolisthesis of L4 versus L5 and grade 1 retrolisthesis of L1 versus L2  and L2 versus L3.  She had multilevel spondylosis and facet joint arthropathy  Chronic SI joint pain-she continues to have pain and discomfort in the SI joints.  Age-related osteoporosis without current pathological fracture-she continues to be on Prolia  by her GYN.  Gait instability-she has been ambulating with the help of a walker.  Other medical problems listed as follows:  History of hyperlipidemia  Essential hypertension, benign  History of gastroesophageal reflux (GERD)  History of IBS  Hypothyroidism due to acquired atrophy of thyroid   Orders: No orders of the defined types were placed in this encounter.  No orders of the defined types were placed in this encounter.    Follow-Up Instructions: Return in about 6 months (around 11/15/2024) for Osteoarthritis.   Nicholas Bari, MD  Note - This record has been created using Animal nutritionist.  Chart creation errors have been sought, but may not always  have been located. Such creation errors do not reflect on  the standard of medical care.

## 2024-05-14 ENCOUNTER — Ambulatory Visit: Admitting: Internal Medicine

## 2024-05-16 ENCOUNTER — Ambulatory Visit: Admitting: Rheumatology

## 2024-05-16 ENCOUNTER — Encounter: Payer: Self-pay | Admitting: Rheumatology

## 2024-05-16 VITALS — BP 135/77 | HR 81 | Resp 16 | Ht 60.0 in | Wt 113.4 lb

## 2024-05-16 DIAGNOSIS — M51369 Other intervertebral disc degeneration, lumbar region without mention of lumbar back pain or lower extremity pain: Secondary | ICD-10-CM

## 2024-05-16 DIAGNOSIS — R2681 Unsteadiness on feet: Secondary | ICD-10-CM

## 2024-05-16 DIAGNOSIS — M19042 Primary osteoarthritis, left hand: Secondary | ICD-10-CM | POA: Diagnosis not present

## 2024-05-16 DIAGNOSIS — Z8719 Personal history of other diseases of the digestive system: Secondary | ICD-10-CM | POA: Diagnosis not present

## 2024-05-16 DIAGNOSIS — M5030 Other cervical disc degeneration, unspecified cervical region: Secondary | ICD-10-CM | POA: Insufficient documentation

## 2024-05-16 DIAGNOSIS — G5703 Lesion of sciatic nerve, bilateral lower limbs: Secondary | ICD-10-CM | POA: Insufficient documentation

## 2024-05-16 DIAGNOSIS — M19041 Primary osteoarthritis, right hand: Secondary | ICD-10-CM | POA: Diagnosis not present

## 2024-05-16 DIAGNOSIS — M19071 Primary osteoarthritis, right ankle and foot: Secondary | ICD-10-CM | POA: Insufficient documentation

## 2024-05-16 DIAGNOSIS — G8929 Other chronic pain: Secondary | ICD-10-CM | POA: Diagnosis not present

## 2024-05-16 DIAGNOSIS — E034 Atrophy of thyroid (acquired): Secondary | ICD-10-CM | POA: Diagnosis not present

## 2024-05-16 DIAGNOSIS — M533 Sacrococcygeal disorders, not elsewhere classified: Secondary | ICD-10-CM | POA: Insufficient documentation

## 2024-05-16 DIAGNOSIS — Z8639 Personal history of other endocrine, nutritional and metabolic disease: Secondary | ICD-10-CM

## 2024-05-16 DIAGNOSIS — M62830 Muscle spasm of back: Secondary | ICD-10-CM | POA: Diagnosis not present

## 2024-05-16 DIAGNOSIS — I10 Essential (primary) hypertension: Secondary | ICD-10-CM

## 2024-05-16 DIAGNOSIS — M19072 Primary osteoarthritis, left ankle and foot: Secondary | ICD-10-CM

## 2024-05-16 DIAGNOSIS — M810 Age-related osteoporosis without current pathological fracture: Secondary | ICD-10-CM

## 2024-05-16 DIAGNOSIS — M7918 Myalgia, other site: Secondary | ICD-10-CM | POA: Diagnosis present

## 2024-05-16 MED ORDER — LIDOCAINE HCL 1 % IJ SOLN
0.5000 mL | INTRAMUSCULAR | Status: AC | PRN
Start: 2024-05-16 — End: 2024-05-16
  Administered 2024-05-16: .5 mL

## 2024-05-16 MED ORDER — TRIAMCINOLONE ACETONIDE 40 MG/ML IJ SUSP
20.0000 mg | INTRAMUSCULAR | Status: AC | PRN
  Administered 2024-05-16: 20 mg via INTRAMUSCULAR

## 2024-05-16 MED ORDER — LIDOCAINE HCL 1 % IJ SOLN
0.5000 mL | INTRAMUSCULAR | Status: AC | PRN
  Administered 2024-05-16: .5 mL

## 2024-05-22 ENCOUNTER — Ambulatory Visit: Payer: Self-pay | Admitting: *Deleted

## 2024-05-22 ENCOUNTER — Encounter: Payer: Self-pay | Admitting: Internal Medicine

## 2024-05-22 ENCOUNTER — Ambulatory Visit (INDEPENDENT_AMBULATORY_CARE_PROVIDER_SITE_OTHER): Admitting: Internal Medicine

## 2024-05-22 VITALS — BP 130/70 | HR 80 | Temp 98.1°F | Ht 60.0 in | Wt 108.0 lb

## 2024-05-22 DIAGNOSIS — L03012 Cellulitis of left finger: Secondary | ICD-10-CM | POA: Insufficient documentation

## 2024-05-22 MED ORDER — MAGNESIUM SULFATE POWD: 0 refills | Status: AC

## 2024-05-22 MED ORDER — CEPHALEXIN 500 MG PO CAPS: 500.0000 mg | ORAL_CAPSULE | Freq: Three times a day (TID) | ORAL | 0 refills | Status: AC

## 2024-05-22 NOTE — Progress Notes (Signed)
 Subjective:    Patient ID: [name], female    DOB: 1936-02-11, 88 y.o.   MRN: [id number]      HPI [name] is here for  Chief Complaint  Patient presents with   Hand Pain    Left middle finger nail infection    Infection left middle finger-started 3-4 days ago.  The pain was worse yesterday than it is today, but it is still painful.  She denies any injury.  She denies any drainage.  She denies any fevers or chills.      Medications and allergies reviewed with patient and updated if appropriate.  Current Outpatient Medications on File Prior to Visit  Medication Sig Dispense Refill   acetaminophen  (TYLENOL ) 500 MG tablet Take 1,000 mg by mouth every 6 (six) hours as needed for mild pain.     albuterol  (VENTOLIN  HFA) 108 (90 Base) MCG/ACT inhaler Take 2 puffs every 4-6 hours as needed     amLODipine  (NORVASC ) 2.5 MG tablet Take 2.5 mg by mouth as needed.     Ascorbic Acid (VITAMIN C ) 100 MG tablet Take 1 tablet (100 mg total) by mouth daily. 100 tablet 1   azelastine  (ASTELIN ) 0.1 % nasal spray 1 spray in each nostril Nasally Once a day in the evening     Azelastine -Fluticasone  137-50 MCG/ACT SUSP USE ONE SPRAY IN EACH NOSTRIL EVERY TWELVE HOURS 23 g 0   Calcium  Carbonate-Vitamin D  (CALCIUM  CARBONATE W/VITAMIN D  PO) Take 1 tablet by mouth daily.     cyanocobalamin  (VITAMIN B12) 1000 MCG tablet Take 1 tablet (1,000 mcg total) by mouth daily. 100 tablet 3   desonide  (DESOWEN ) 0.05 % cream Apply 1 application topically as needed.     EPINEPHrine  (EPIPEN  2-PAK) 0.3 mg/0.3 mL IJ SOAJ injection Inject 0.3 mLs (0.3 mg total) into the muscle as needed. 1 Device 1   erythromycin  ophthalmic ointment Place 1 Application into both eyes as needed.     famotidine  (PEPCID ) 40 MG tablet Take 40 mg by mouth as needed for heartburn or indigestion.     hydrocortisone  (ANUSOL -HC) 2.5 % rectal cream Place 1 Application rectally as needed for hemorrhoids or anal itching.     ipratropium  (ATROVENT ) 0.06 % nasal spray Place 2 sprays into both nostrils 2 (two) times daily as needed for rhinitis.     Lactase (LACTAID PO) Take by mouth.     levocetirizine (XYZAL ) 5 MG tablet Take 1 tablet (5 mg total) by mouth every evening. 30 tablet 2   levothyroxine  (SYNTHROID ) 50 MCG tablet TAKE ONE TABLET BY MOUTH DAILY BEFORE BREAKFAST 30 tablet 5   LORazepam  (ATIVAN ) 1 MG tablet Take 2-3 tablets at bedtime and 1-2 tablets in the daytime as needed. 150 tablet 2   MegaRed Omega-3 Krill Oil 500 MG CAPS Take 1 capsule by mouth every morning. 100 capsule 3   memantine  (NAMENDA  XR) 14 MG CP24 24 hr capsule Take 1 capsule (14 mg total) by mouth at bedtime. 90 capsule 11   mirabegron  ER (MYRBETRIQ ) 25 MG TB24 tablet Take 1 tablet (25 mg total) by mouth daily. 90 tablet 0   montelukast  (SINGULAIR ) 10 MG tablet Take 1 tablet (10 mg total) by mouth at bedtime. 90 tablet 1   pantoprazole  (PROTONIX ) 40 MG tablet TAKE ONE TABLET BY MOUTH TWICE DAILY BEFORE MEALS 60 tablet 5   polyethylene glycol powder (GLYCOLAX /MIRALAX ) 17 GM/SCOOP powder Take 17 g by mouth 2 (two) times daily as needed for moderate constipation. 500  g 3   psyllium (METAMUCIL) 58.6 % packet Take 1 packet by mouth as needed.     triamcinolone  ointment (KENALOG ) 0.1 % Apply 1 Application topically 3 (three) times daily. As needed for itching 80 g 2   UNABLE TO FIND Med Name: CBD oil - under tongue     No current facility-administered medications on file prior to visit.    Review of Systems     Objective:   Vitals:   05/22/24 1405  BP: 130/70  Pulse: 80  Temp: 98.1 F (36.7 C)  SpO2: 95%   BP Readings from Last 3 Encounters:  05/22/24 130/70  05/16/24 135/77  05/07/24 110/62   Wt Readings from Last 3 Encounters:  05/22/24 108 lb (49 kg)  05/16/24 113 lb 6.4 oz (51.4 kg)  05/07/24 110 lb 3.2 oz (50 kg)   Body mass index is 21.09 kg/m.    Physical Exam Constitutional:      General: She is not in acute distress.     Appearance: Normal appearance. She is not ill-appearing.  HENT:     Head: Normocephalic and atraumatic.   Skin:    General: Skin is warm and dry.     Comments: Left middle finger-mid medial aspect of nail there is a small abscess that is very tender-no active drainage or bleeding.  Appears to be pus filled.  Surrounding erythema and the skin that is also slightly swollen and tender.  No redness extending down towards the DIP joint   Neurological:     Mental Status: She is alert.            Assessment & Plan:    Encounter Diagnosis  Name Primary?   Paronychia of left middle finger Yes   Acute Symptoms started 3-4 days ago She does have a small abscess there and deferred I&D Advised warm water Epsom salts soaks Start Keflex  500 mg 3 times daily x 7 days Monitor closely and call or return if no improvement

## 2024-05-22 NOTE — Telephone Encounter (Signed)
  FYI Only or Action Required?: FYI only for provider.  Patient was last seen in primary care on 04/30/2024 by Plotnikov, Karlynn GAILS, MD. Called Nurse Triage reporting No chief complaint on file.. Symptoms began several days ago. Interventions attempted: OTC medications: antibiotic ointment. Symptoms are: gradually worsening.  Triage Disposition: See HCP Within 4 Hours (Or PCP Triage)  Patient/caregiver understands and will follow disposition?: yes   Reason for Disposition  [1] SEVERE pain (excruciating) AND [2] not improved 2 hours after pain medicine and antibiotic ointment  Answer Assessment - Initial Assessment Questions 1. LOCATION: Which finger?      Left middle finger- cuticle 2. APPEARANCE: What does it look like?      swelling 3. ONSET: When did it start?       3 days 4. PAIN: Is there any pain? If so, ask: How bad is the pain?   (Mild, Moderate, Severe)     Painful to touch 5. REDNESS: Is there any redness of the skin? If so, ask: How much of the finger is red?     yes 6. PUS: Is there a pocket of pus? If so, ask: How big is it?     No drainage  Protocols used: Fingernail Infection-P-AH    Copied from CRM [id number]. Topic: Clinical - Red Word Triage >> May 22, 2024 10:31 AM Drema MATSU wrote: Red Word that prompted transfer to Nurse Triage: Patient has an infection in one of her fingers around the nail area. She stated that she is in alot of pain.

## 2024-05-22 NOTE — Patient Instructions (Addendum)
 Medications changes include :   keflex  three times a day x 7 days.   Do warm epsom salt soaks     Return if symptoms worsen or fail to improve.     Paronychia Paronychia is an infection of the skin that surrounds a nail. It usually affects the skin around a fingernail, but it may also occur near a toenail. It often causes pain and swelling around the nail. In some cases, a collection of pus (abscess) can form near or under the nail.  This condition may develop suddenly, or it may develop gradually over a longer period. In most cases, paronychia is not serious, and it will clear up with treatment. What are the causes? This condition may be caused by bacteria or a fungus, such as yeast. The bacteria or fungus can enter the body through an opening in the skin, such as a cut or a hangnail, and cause an infection in your fingernail or toenail. Other causes may include: Recurrent injury to the fingernail or toenail area. Irritation of the base and sides of the nail (cuticle). Injury and irritation can result in inflammation, swelling, and thickened skin around the nail. What increases the risk? This condition is more likely to develop in people who: Get their hands wet often, such as those who work as Fish farm manager, bartenders, or housekeepers. Bite their fingernails or cuticles. Have underlying skin conditions. Have hangnails or injured fingertips. Are exposed to irritants like detergents and other chemicals. Have diabetes. What are the signs or symptoms? Symptoms of this condition include: Redness and swelling of the skin near the nail. Tenderness around the nail when you touch the area. Pus-filled bumps under the cuticle. Fluid or pus under the nail. Throbbing pain in the area. How is this diagnosed? This condition is diagnosed with a physical exam. In some cases, a sample of pus may be tested to determine what type of bacteria or fungus is causing the condition. How is this  treated? Treatment depends on the cause and severity of your condition. If your condition is mild, it may clear up on its own in a few days or after soaking in warm water. If needed, treatment may include: Antibiotic medicine, if your infection is caused by bacteria. Antifungal medicine, if your infection is caused by a fungus. A procedure to drain pus from an abscess. Anti-inflammatory medicine (corticosteroids). Removal of part of an ingrown toenail. A bandage (dressing) may be placed over the affected area if an abscess or part of a nail has been removed. Follow these instructions at home: Wound care Keep the affected area clean. Soak the affected area in warm water if told to do so by your health care provider. You may be told to do this for 20 minutes, 2-3 times a day. Keep the area dry when you are not soaking it. Do not try to drain an abscess yourself. Follow instructions from your health care provider about how to take care of the affected area. Make sure you: Wash your hands with soap and water for at least 20 seconds before and after you change your dressing. If soap and water are not available, use hand sanitizer. Change your dressing as told by your health care provider. If you had an abscess drained, check the area every day for signs of infection. Check for: Redness, swelling, or pain. Fluid or blood. Warmth. Pus or a bad smell. Medicines  Take over-the-counter and prescription medicines only as told by your health care  provider. If you were prescribed an antibiotic medicine, take it as told by your health care provider. Do not stop taking the antibiotic even if you start to feel better. General instructions Avoid contact with any skin irritants or allergens. Do not pick at the affected area. Keep all follow-up visits as told. This is important. Prevention To prevent this condition from happening again: Wear rubber gloves when washing dishes or doing other tasks that  require your hands to get wet. Wear gloves if your hands might come in contact with cleaners or other chemicals. Avoid injuring your nails or fingertips. Do not bite your nails or tear hangnails. Do not cut your nails very short. Do not cut your cuticles. Use clean nail clippers or scissors when trimming nails. Contact a health care provider if: Your symptoms get worse or do not improve with treatment. You have continued or increased fluid, blood, or pus coming from the affected area. Your affected finger, toe, or joint becomes swollen or difficult to move. You have a fever or chills. There is redness spreading away from the affected area. Summary Paronychia is an infection of the skin that surrounds a nail. It often causes pain and swelling around the nail. In some cases, a collection of pus (abscess) can form near or under the nail. This condition may be caused by bacteria or a fungus. These germs can enter the body through an opening in the skin, such as a cut or a hangnail. If your condition is mild, it may clear up on its own in a few days. If needed, treatment may include medicine or a procedure to drain pus from an abscess. To prevent this condition from happening again, wear gloves if doing tasks that require your hands to get wet or to come in contact with chemicals. Also avoid injuring your nails or fingertips. This information is not intended to replace advice given to you by your health care provider. Make sure you discuss any questions you have with your health care provider. Document Revised: 02/16/2021 Document Reviewed: 02/16/2021 Elsevier Patient Education  2024 [employer].

## 2024-05-29 ENCOUNTER — Ambulatory Visit: Payer: Medicare Other | Admitting: Physician Assistant

## 2024-06-18 DIAGNOSIS — R3915 Urgency of urination: Secondary | ICD-10-CM | POA: Diagnosis not present

## 2024-06-18 DIAGNOSIS — R351 Nocturia: Secondary | ICD-10-CM | POA: Diagnosis not present

## 2024-06-29 ENCOUNTER — Other Ambulatory Visit: Payer: Self-pay | Admitting: Internal Medicine

## 2024-06-29 DIAGNOSIS — J310 Chronic rhinitis: Secondary | ICD-10-CM

## 2024-07-02 ENCOUNTER — Ambulatory Visit: Admitting: Physician Assistant

## 2024-07-02 ENCOUNTER — Encounter: Payer: Self-pay | Admitting: Internal Medicine

## 2024-07-02 ENCOUNTER — Telehealth: Payer: Self-pay

## 2024-07-02 ENCOUNTER — Other Ambulatory Visit (HOSPITAL_COMMUNITY): Payer: Self-pay

## 2024-07-02 ENCOUNTER — Ambulatory Visit (INDEPENDENT_AMBULATORY_CARE_PROVIDER_SITE_OTHER): Admitting: Internal Medicine

## 2024-07-02 VITALS — BP 136/78 | HR 79 | Temp 97.8°F | Ht 60.0 in | Wt 106.0 lb

## 2024-07-02 DIAGNOSIS — J310 Chronic rhinitis: Secondary | ICD-10-CM

## 2024-07-02 DIAGNOSIS — K219 Gastro-esophageal reflux disease without esophagitis: Secondary | ICD-10-CM

## 2024-07-02 DIAGNOSIS — J069 Acute upper respiratory infection, unspecified: Secondary | ICD-10-CM | POA: Diagnosis not present

## 2024-07-02 DIAGNOSIS — E034 Atrophy of thyroid (acquired): Secondary | ICD-10-CM

## 2024-07-02 DIAGNOSIS — J3089 Other allergic rhinitis: Secondary | ICD-10-CM

## 2024-07-02 DIAGNOSIS — E782 Mixed hyperlipidemia: Secondary | ICD-10-CM

## 2024-07-02 MED ORDER — AZITHROMYCIN 250 MG PO TABS
ORAL_TABLET | ORAL | 0 refills | Status: DC
Start: 2024-07-02 — End: 2024-07-11

## 2024-07-02 MED ORDER — AZELASTINE-FLUTICASONE 137-50 MCG/ACT NA SUSP: NASAL | 3 refills | Status: DC

## 2024-07-02 MED ORDER — METHYLPREDNISOLONE ACETATE 80 MG/ML IJ SUSP
80.0000 mg | Freq: Once | INTRAMUSCULAR | Status: AC
  Administered 2024-07-02: 80 mg via INTRAMUSCULAR

## 2024-07-02 NOTE — Progress Notes (Unsigned)
 Subjective:  Patient ID: [name], female    DOB: 08-Jun-1936  Age: 88 y.o. MRN: [id number]  CC: Medical Management of Chronic Issues (2 week f/u)   HPI [name] presents for chest tightness, post-nasal drip, not feeling well.  [place] thinks she has a cold.  She is planning to fly to [place]  in the end of this week for her birthday.  No exertional symptoms. Follow-up on allergies, GERD   Outpatient Medications Prior to Visit  Medication Sig Dispense Refill   acetaminophen  (TYLENOL ) 500 MG tablet Take 1,000 mg by mouth every 6 (six) hours as needed for mild pain.     albuterol  (VENTOLIN  HFA) 108 (90 Base) MCG/ACT inhaler Take 2 puffs every 4-6 hours as needed     amLODipine  (NORVASC ) 2.5 MG tablet Take 2.5 mg by mouth as needed.     Ascorbic Acid (VITAMIN C ) 100 MG tablet Take 1 tablet (100 mg total) by mouth daily. 100 tablet 1   azelastine  (ASTELIN ) 0.1 % nasal spray 1 spray in each nostril Nasally Once a day in the evening     Calcium  Carbonate-Vitamin D  (CALCIUM  CARBONATE W/VITAMIN D  PO) Take 1 tablet by mouth daily.     cyanocobalamin  (VITAMIN B12) 1000 MCG tablet Take 1 tablet (1,000 mcg total) by mouth daily. 100 tablet 3   desonide  (DESOWEN ) 0.05 % cream Apply 1 application topically as needed.     EPINEPHrine  (EPIPEN  2-PAK) 0.3 mg/0.3 mL IJ SOAJ injection Inject 0.3 mLs (0.3 mg total) into the muscle as needed. 1 Device 1   erythromycin  ophthalmic ointment Place 1 Application into both eyes as needed.     famotidine  (PEPCID ) 40 MG tablet Take 40 mg by mouth as needed for heartburn or indigestion.     hydrocortisone  (ANUSOL -HC) 2.5 % rectal cream Place 1 Application rectally as needed for hemorrhoids or anal itching.     ipratropium (ATROVENT ) 0.06 % nasal spray Place 2 sprays into both nostrils 2 (two) times daily as needed for rhinitis.     Lactase (LACTAID PO) Take by mouth.     levocetirizine (XYZAL ) 5 MG tablet Take 1 tablet (5 mg total) by mouth every evening. 30  tablet 2   levothyroxine  (SYNTHROID ) 50 MCG tablet TAKE ONE TABLET BY MOUTH DAILY BEFORE BREAKFAST 30 tablet 5   LORazepam  (ATIVAN ) 1 MG tablet Take 2-3 tablets at bedtime and 1-2 tablets in the daytime as needed. 150 tablet 2   Magnesium  Sulfate (EPSOM SALT) POWD UAD for warm water soaks 500 g 0   MegaRed Omega-3 Krill Oil 500 MG CAPS Take 1 capsule by mouth every morning. 100 capsule 3   memantine  (NAMENDA  XR) 14 MG CP24 24 hr capsule Take 1 capsule (14 mg total) by mouth at bedtime. 90 capsule 11   mirabegron  ER (MYRBETRIQ ) 25 MG TB24 tablet Take 1 tablet (25 mg total) by mouth daily. 90 tablet 0   montelukast  (SINGULAIR ) 10 MG tablet Take 1 tablet (10 mg total) by mouth at bedtime. 90 tablet 1   pantoprazole  (PROTONIX ) 40 MG tablet TAKE ONE TABLET BY MOUTH TWICE DAILY BEFORE MEALS 60 tablet 5   polyethylene glycol powder (GLYCOLAX /MIRALAX ) 17 GM/SCOOP powder Take 17 g by mouth 2 (two) times daily as needed for moderate constipation. 500 g 3   psyllium (METAMUCIL) 58.6 % packet Take 1 packet by mouth as needed.     triamcinolone  ointment (KENALOG ) 0.1 % Apply 1 Application topically 3 (three) times daily. As needed for  itching 80 g 2   UNABLE TO FIND Med Name: CBD oil - under tongue     Azelastine -Fluticasone  137-50 MCG/ACT SUSP USE ONE SPRAY IN EACH NOSTRIL EVERY TWELVE HOURS 23 g 0   No facility-administered medications prior to visit.    ROS: Review of Systems  Constitutional:  Positive for chills. Negative for activity change, appetite change, fatigue and unexpected weight change.  HENT:  Positive for congestion, postnasal drip, rhinorrhea, sinus pressure, sinus pain and sore throat. Negative for mouth sores.   Eyes:  Negative for visual disturbance.  Respiratory:  Positive for chest tightness. Negative for cough and shortness of breath.   Cardiovascular:  Negative for chest pain and palpitations.  Gastrointestinal:  Negative for abdominal pain and nausea.  Genitourinary:  Negative  for difficulty urinating, frequency and vaginal pain.  Musculoskeletal:  Negative for back pain and gait problem.  Skin:  Negative for pallor and rash.  Neurological:  Negative for dizziness, tremors, weakness, numbness and headaches.  Psychiatric/Behavioral:  Negative for confusion and sleep disturbance.     Objective:  BP 136/78   Pulse 79   Temp 97.8 F (36.6 C) (Oral)   Ht 5' (1.524 m)   Wt 106 lb (48.1 kg)   SpO2 97%   BMI 20.70 kg/m   BP Readings from Last 3 Encounters:  07/02/24 136/78  05/22/24 130/70  05/16/24 135/77    Wt Readings from Last 3 Encounters:  07/02/24 106 lb (48.1 kg)  05/22/24 108 lb (49 kg)  05/16/24 113 lb 6.4 oz (51.4 kg)    Physical Exam Constitutional:      General: She is not in acute distress.    Appearance: She is well-developed. She is not ill-appearing.  HENT:     Head: Normocephalic.     Right Ear: External ear normal.     Left Ear: External ear normal.     Nose: Nose normal.     Mouth/Throat:     Pharynx: Posterior oropharyngeal erythema present. No oropharyngeal exudate.  Eyes:     General:        Right eye: No discharge.        Left eye: No discharge.     Conjunctiva/sclera: Conjunctivae normal.     Pupils: Pupils are equal, round, and reactive to light.  Neck:     Thyroid : No thyromegaly.     Vascular: No JVD.     Trachea: No tracheal deviation.  Cardiovascular:     Rate and Rhythm: Normal rate and regular rhythm.     Heart sounds: Normal heart sounds.  Pulmonary:     Effort: No respiratory distress.     Breath sounds: No stridor. No wheezing.  Abdominal:     General: Bowel sounds are normal. There is no distension.     Palpations: Abdomen is soft. There is no mass.     Tenderness: There is no abdominal tenderness. There is no guarding or rebound.  Musculoskeletal:        General: No tenderness.     Cervical back: Normal range of motion and neck supple. No rigidity.  Lymphadenopathy:     Cervical: No cervical  adenopathy.  Skin:    Findings: No erythema or rash.  Neurological:     Cranial Nerves: No cranial nerve deficit.     Motor: No abnormal muscle tone.     Coordination: Coordination normal.     Gait: Gait abnormal.     Deep Tendon Reflexes: Reflexes normal.  Psychiatric:  Behavior: Behavior normal.        Thought Content: Thought content normal.        Judgment: Judgment normal.   No acute distress.  Erythematous throat and nasal passages  Lab Results  Component Value Date   WBC 8.9 02/15/2024   HGB 13.3 02/15/2024   HCT 39.2 02/15/2024   PLT 282.0 02/15/2024   GLUCOSE 109 (H) 02/15/2024   CHOL 274 (H) 02/15/2024   TRIG 181.0 (H) 02/15/2024   HDL 72.00 02/15/2024   LDLDIRECT 98.0 08/13/2021   LDLCALC 166 (H) 02/15/2024   ALT 45 (H) 02/15/2024   AST 23 02/15/2024   NA 129 (L) 02/15/2024   K 3.9 02/15/2024   CL 95 (L) 02/15/2024   CREATININE 0.52 02/15/2024   BUN 12 02/15/2024   CO2 26 02/15/2024   TSH 1.96 02/15/2024   HGBA1C 5.5 06/13/2020    MM 3D SCREENING MAMMOGRAM BILATERAL BREAST Result Date: 04/19/2024 CLINICAL DATA:  Screening. EXAM: DIGITAL SCREENING BILATERAL MAMMOGRAM WITH TOMOSYNTHESIS AND CAD TECHNIQUE: Bilateral screening digital craniocaudal and mediolateral oblique mammograms were obtained. Bilateral screening digital breast tomosynthesis was performed. The images were evaluated with computer-aided detection. COMPARISON:  Previous exam(s). ACR Breast Density Category b: There are scattered areas of fibroglandular density. FINDINGS: There are no findings suspicious for malignancy. IMPRESSION: No mammographic evidence of malignancy. A result letter of this screening mammogram will be mailed directly to the patient. RECOMMENDATION: Screening mammogram in one year. (Code:SM-B-01Y) BI-RADS CATEGORY  1: Negative. Electronically Signed   By: Craig Farr M.D.   On: 04/19/2024 15:16    Assessment & Plan:   Problem List Items Addressed This Visit      Allergic rhinitis - Primary   Worse.  Depo-Medrol  80 mg IM.  Use Afrin as needed.  Continue with the Dymista       GERD (gastroesophageal reflux disease)   Chronic. Continue Protonix   Potential benefits of a long term PPI  use as well as potential risks  and complications were explained to the patient and were aknowledged. GERD wedge      Hyperlipemia, mixed   Hypothyroidism   Upper respiratory infection   New.  Z-Pak if worse.  Prescription provided      Relevant Medications   azithromycin  (ZITHROMAX  Z-PAK) 250 MG tablet   Other Visit Diagnoses       Chronic rhinitis       Relevant Medications   Azelastine -Fluticasone  137-50 MCG/ACT SUSP         Meds ordered this encounter  Medications   azithromycin  (ZITHROMAX  Z-PAK) 250 MG tablet    Sig: As directed    Dispense:  6 tablet    Refill:  0   Azelastine -Fluticasone  137-50 MCG/ACT SUSP    Sig: USE ONE SPRAY IN EACH NOSTRIL BID    Dispense:  23 g    Refill:  3   methylPREDNISolone  acetate (DEPO-MEDROL ) injection 80 mg      Follow-up: No follow-ups on file.  Marolyn Noel, MD

## 2024-07-02 NOTE — Patient Instructions (Signed)
 USEFUL THINGS FOR ARTHRITIS and musculoskeletal pains:    A "rice sock heating pad" refers to a homemade heating pad created by filling a sock with uncooked rice, which can be heated in a microwave to provide a warm compress for sore muscles, pain relief, or other applications; essentially, it's a simple way to generate heat using readily available materials.  Key points about rice sock heat: How to make it: Fill a clean sock (preferably a tube sock) about 2/3 full with uncooked rice, tie a knot at the top to secure the rice inside.  Heating it up: Place the rice sock in the microwave and heat in short intervals (usually around 30 seconds at a time) until it reaches the desired warmth.  Important considerations: Check temperature before applying: Always test the temperature of the rice sock before applying it to your skin to avoid burns.  Use a towel to protect skin: Wrap the rice sock in a thin towel to distribute the heat evenly and protect your skin.  Uses: Muscle aches and pains  Menstrual cramps  Neck pain  Arthritis discomfort

## 2024-07-02 NOTE — Telephone Encounter (Signed)
 Pharmacy Patient Advocate Encounter   Received notification from CoverMyMeds that prior authorization for Azelastine -Fluticasone  137-50MCG/ACT suspension is required/requested.   Insurance verification completed.   The patient is insured through [employer] .   Per test claim: PA required; PA started via CoverMyMeds. KEY [id number] . Waiting for clinical questions to populate.

## 2024-07-03 ENCOUNTER — Other Ambulatory Visit (HOSPITAL_COMMUNITY): Payer: Self-pay

## 2024-07-03 ENCOUNTER — Encounter: Payer: Self-pay | Admitting: Internal Medicine

## 2024-07-03 ENCOUNTER — Telehealth: Payer: Self-pay | Admitting: Internal Medicine

## 2024-07-03 LAB — CBC WITH DIFFERENTIAL/PLATELET
Basophils Absolute: 0.1 K/uL (ref 0.0–0.1)
Basophils Relative: 0.9 % (ref 0.0–3.0)
Eosinophils Absolute: 0.1 K/uL (ref 0.0–0.7)
Eosinophils Relative: 1.1 % (ref 0.0–5.0)
HCT: 41.5 % (ref 36.0–46.0)
Hemoglobin: 13.9 g/dL (ref 12.0–15.0)
Lymphocytes Relative: 19 % (ref 12.0–46.0)
Lymphs Abs: 1.3 K/uL (ref 0.7–4.0)
MCHC: 33.5 g/dL (ref 30.0–36.0)
MCV: 95.3 fl (ref 78.0–100.0)
Monocytes Absolute: 0.8 K/uL (ref 0.1–1.0)
Monocytes Relative: 10.9 % (ref 3.0–12.0)
Neutro Abs: 4.8 K/uL (ref 1.4–7.7)
Neutrophils Relative %: 68.1 % (ref 43.0–77.0)
Platelets: 216 K/uL (ref 150.0–400.0)
RBC: 4.35 Mil/uL (ref 3.87–5.11)
RDW: 13.2 % (ref 11.5–15.5)
WBC: 7 K/uL (ref 4.0–10.5)

## 2024-07-03 LAB — COMPREHENSIVE METABOLIC PANEL WITH GFR
ALT: 23 U/L (ref 0–35)
AST: 28 U/L (ref 0–37)
Albumin: 4.5 g/dL (ref 3.5–5.2)
Alkaline Phosphatase: 58 U/L (ref 39–117)
BUN: 11 mg/dL (ref 6–23)
CO2: 18 meq/L — ABNORMAL LOW (ref 19–32)
Calcium: 9.6 mg/dL (ref 8.4–10.5)
Chloride: 97 meq/L (ref 96–112)
Creatinine, Ser: 0.76 mg/dL (ref 0.40–1.20)
GFR: 71.16 mL/min (ref >60.00)
Glucose, Bld: 94 mg/dL (ref 70–99)
Potassium: 4.4 meq/L (ref 3.5–5.1)
Sodium: 132 meq/L — ABNORMAL LOW (ref 135–145)
Total Bilirubin: 0.3 mg/dL (ref 0.2–1.2)
Total Protein: 7.8 g/dL (ref 6.0–8.3)

## 2024-07-03 LAB — TSH: TSH: 1.63 u[IU]/mL (ref 0.35–5.50)

## 2024-07-03 LAB — T4, FREE: Free T4: 1.21 ng/dL (ref 0.60–1.60)

## 2024-07-03 NOTE — Telephone Encounter (Signed)
 PLEASE BE ADVISED Clinical questions have been answered and PA submitted.TO PLAN. PA currently Pending.

## 2024-07-03 NOTE — Telephone Encounter (Signed)
 Pharmacy Patient Advocate Encounter  Received notification from [employer] that Prior Authorization for Azelastine -Fluticasone  137-50MCG/ACT suspension has been APPROVED from 12/24/2023 to 11/28/2024   PA #/Case ID/Reference #: PA Case: [id number]

## 2024-07-03 NOTE — Assessment & Plan Note (Signed)
 Chronic. Continue Protonix   Potential benefits of a long term PPI  use as well as potential risks  and complications were explained to the patient and were aknowledged. GERD wedge

## 2024-07-03 NOTE — Telephone Encounter (Unsigned)
 Copied from CRM #[id number]. Topic: Clinical - Medication Question >> Jul 03, 2024 12:34 PM Burnard DEL wrote: Reason for CRM: Patient would like to know if it would be okay for her to go ahead and start to take  azithromycin  (ZITHROMAX  Z-PAK) 250 MG tablet because she isnt feeling too well today.  And if maybe a medication could be sent to the pharmacy for her.She also stated that she has some itching in her rectum that she would ike advice on what she can use OTC for it?

## 2024-07-03 NOTE — Assessment & Plan Note (Signed)
 Worse.  Depo-Medrol  80 mg IM.  Use Afrin as needed.  Continue with the Dymista 

## 2024-07-03 NOTE — Assessment & Plan Note (Signed)
 New.  Z-Pak if worse.  Prescription provided

## 2024-07-04 ENCOUNTER — Ambulatory Visit: Payer: Self-pay | Admitting: Internal Medicine

## 2024-07-05 NOTE — Telephone Encounter (Signed)
 I already replied to the patient.  She knows to take a Z-Pak.

## 2024-07-11 ENCOUNTER — Encounter: Payer: Self-pay | Admitting: Internal Medicine

## 2024-07-11 ENCOUNTER — Ambulatory Visit (INDEPENDENT_AMBULATORY_CARE_PROVIDER_SITE_OTHER): Admitting: Internal Medicine

## 2024-07-11 ENCOUNTER — Ambulatory Visit (INDEPENDENT_AMBULATORY_CARE_PROVIDER_SITE_OTHER)

## 2024-07-11 ENCOUNTER — Ambulatory Visit: Payer: Self-pay | Admitting: Internal Medicine

## 2024-07-11 VITALS — BP 130/78 | HR 83 | Temp 97.9°F | Ht 60.0 in

## 2024-07-11 DIAGNOSIS — J069 Acute upper respiratory infection, unspecified: Secondary | ICD-10-CM | POA: Diagnosis not present

## 2024-07-11 DIAGNOSIS — I700 Atherosclerosis of aorta: Secondary | ICD-10-CM | POA: Diagnosis not present

## 2024-07-11 DIAGNOSIS — R059 Cough, unspecified: Secondary | ICD-10-CM | POA: Diagnosis not present

## 2024-07-11 DIAGNOSIS — I10 Essential (primary) hypertension: Secondary | ICD-10-CM | POA: Diagnosis not present

## 2024-07-11 DIAGNOSIS — J301 Allergic rhinitis due to pollen: Secondary | ICD-10-CM | POA: Diagnosis not present

## 2024-07-11 MED ORDER — METHYLPREDNISOLONE ACETATE 80 MG/ML IJ SUSP
80.0000 mg | Freq: Once | INTRAMUSCULAR | Status: AC
  Administered 2024-07-11 (×2): 80 mg via INTRAMUSCULAR

## 2024-07-11 MED ORDER — AZITHROMYCIN 250 MG PO TABS: ORAL_TABLET | ORAL | 0 refills | Status: DC

## 2024-07-11 MED ORDER — GUAIFENESIN-DM 100-10 MG/5ML PO SYRP: 5.0000 mL | ORAL_SOLUTION | ORAL | 0 refills | Status: AC | PRN

## 2024-07-11 NOTE — Patient Instructions (Signed)
 Stop steroid nasal spray x 1 week Use Afrin x 1 week

## 2024-07-11 NOTE — Assessment & Plan Note (Addendum)
 Worse Stop steroid nasal spray x 1 week Use Afrin x 1 week Depo-medrol  80 mg IM

## 2024-07-11 NOTE — Assessment & Plan Note (Signed)
  Take Norvasc  2.5 mg daily  SBP ok at home

## 2024-07-11 NOTE — Assessment & Plan Note (Addendum)
 Will get a CXR Stop steroid nasal spray x 1 week Use Afrin x 1 week Depo-medrol  80 mg IM Repeat Z pack if worse

## 2024-07-11 NOTE — Addendum Note (Signed)
 Addended by: LEAR, Elridge Stemm P on: 07/11/2024 04:57 PM   Modules accepted: Orders

## 2024-07-11 NOTE — Progress Notes (Signed)
 Subjective:  Patient ID: Johnston VEAR Schneider, female    DOB: 11-24-1936  Age: 88 y.o. MRN: [id number]  CC: Medical Management of Chronic Issues   HPI MILDRETH REEK presents for URI - not better Perri spent a week in [place], took a Z-pack last week C/o congestion in the chest, cough C/o nasal drainage - Dymista  is not helping  Outpatient Medications Prior to Visit  Medication Sig Dispense Refill   acetaminophen  (TYLENOL ) 500 MG tablet Take 1,000 mg by mouth every 6 (six) hours as needed for mild pain.     albuterol  (VENTOLIN  HFA) 108 (90 Base) MCG/ACT inhaler Take 2 puffs every 4-6 hours as needed     amLODipine  (NORVASC ) 2.5 MG tablet Take 2.5 mg by mouth as needed.     Ascorbic Acid (VITAMIN C ) 100 MG tablet Take 1 tablet (100 mg total) by mouth daily. 100 tablet 1   azelastine  (ASTELIN ) 0.1 % nasal spray 1 spray in each nostril Nasally Once a day in the evening     Azelastine -Fluticasone  137-50 MCG/ACT SUSP USE ONE SPRAY IN EACH NOSTRIL BID 23 g 3   Calcium  Carbonate-Vitamin D  (CALCIUM  CARBONATE W/VITAMIN D  PO) Take 1 tablet by mouth daily.     cyanocobalamin  (VITAMIN B12) 1000 MCG tablet Take 1 tablet (1,000 mcg total) by mouth daily. 100 tablet 3   desonide  (DESOWEN ) 0.05 % cream Apply 1 application topically as needed.     EPINEPHrine  (EPIPEN  2-PAK) 0.3 mg/0.3 mL IJ SOAJ injection Inject 0.3 mLs (0.3 mg total) into the muscle as needed. 1 Device 1   erythromycin  ophthalmic ointment Place 1 Application into both eyes as needed.     famotidine  (PEPCID ) 40 MG tablet Take 40 mg by mouth as needed for heartburn or indigestion.     hydrocortisone  (ANUSOL -HC) 2.5 % rectal cream Place 1 Application rectally as needed for hemorrhoids or anal itching.     ipratropium (ATROVENT ) 0.06 % nasal spray Place 2 sprays into both nostrils 2 (two) times daily as needed for rhinitis.     Lactase (LACTAID PO) Take by mouth.     levocetirizine (XYZAL ) 5 MG tablet Take 1 tablet (5 mg total) by mouth every evening.  30 tablet 2   levothyroxine  (SYNTHROID ) 50 MCG tablet TAKE ONE TABLET BY MOUTH DAILY BEFORE BREAKFAST 30 tablet 5   LORazepam  (ATIVAN ) 1 MG tablet Take 2-3 tablets at bedtime and 1-2 tablets in the daytime as needed. 150 tablet 2   Magnesium  Sulfate (EPSOM SALT) POWD UAD for warm water soaks 500 g 0   MegaRed Omega-3 Krill Oil 500 MG CAPS Take 1 capsule by mouth every morning. 100 capsule 3   memantine  (NAMENDA  XR) 14 MG CP24 24 hr capsule Take 1 capsule (14 mg total) by mouth at bedtime. 90 capsule 11   mirabegron  ER (MYRBETRIQ ) 25 MG TB24 tablet Take 1 tablet (25 mg total) by mouth daily. 90 tablet 0   montelukast  (SINGULAIR ) 10 MG tablet Take 1 tablet (10 mg total) by mouth at bedtime. 90 tablet 1   pantoprazole  (PROTONIX ) 40 MG tablet TAKE ONE TABLET BY MOUTH TWICE DAILY BEFORE MEALS 60 tablet 5   polyethylene glycol powder (GLYCOLAX /MIRALAX ) 17 GM/SCOOP powder Take 17 g by mouth 2 (two) times daily as needed for moderate constipation. 500 g 3   psyllium (METAMUCIL) 58.6 % packet Take 1 packet by mouth as needed.     triamcinolone  ointment (KENALOG ) 0.1 % Apply 1 Application topically 3 (three) times daily. As needed  for itching 80 g 2   UNABLE TO FIND Med Name: CBD oil - under tongue     azithromycin  (ZITHROMAX  Z-PAK) 250 MG tablet As directed 6 tablet 0   No facility-administered medications prior to visit.    ROS: Review of Systems  Constitutional:  Negative for activity change, appetite change, chills, diaphoresis, fatigue and unexpected weight change.  HENT:  Positive for congestion and sinus pressure. Negative for ear discharge, facial swelling, mouth sores, sore throat, trouble swallowing and voice change.   Eyes:  Negative for visual disturbance.  Respiratory:  Positive for cough and chest tightness. Negative for shortness of breath and wheezing.   Cardiovascular:  Negative for chest pain, palpitations and leg swelling.  Gastrointestinal:  Negative for abdominal pain and nausea.   Genitourinary:  Negative for difficulty urinating, frequency and vaginal pain.  Musculoskeletal:  Positive for neck stiffness. Negative for back pain and gait problem.  Skin:  Negative for pallor and rash.  Neurological:  Negative for dizziness, tremors, weakness, numbness and headaches.  Psychiatric/Behavioral:  Positive for decreased concentration. Negative for confusion and sleep disturbance. The patient is nervous/anxious.     Objective:  BP 130/78   Pulse 83   Temp 97.9 F (36.6 C) (Oral)   Ht 5' (1.524 m)   SpO2 96%   BMI 20.70 kg/m   BP Readings from Last 3 Encounters:  07/11/24 130/78  07/02/24 136/78  05/22/24 130/70    Wt Readings from Last 3 Encounters:  07/02/24 106 lb (48.1 kg)  05/22/24 108 lb (49 kg)  05/16/24 113 lb 6.4 oz (51.4 kg)    Physical Exam Constitutional:      General: She is not in acute distress.    Appearance: Normal appearance. She is well-developed.  HENT:     Head: Normocephalic.     Right Ear: External ear normal.     Left Ear: External ear normal.     Nose: Nose normal.  Eyes:     General:        Right eye: No discharge.        Left eye: No discharge.     Conjunctiva/sclera: Conjunctivae normal.     Pupils: Pupils are equal, round, and reactive to light.  Neck:     Thyroid : No thyromegaly.     Vascular: No JVD.     Trachea: No tracheal deviation.  Cardiovascular:     Rate and Rhythm: Normal rate and regular rhythm.     Heart sounds: Normal heart sounds.  Pulmonary:     Effort: No respiratory distress.     Breath sounds: No stridor. No wheezing.  Abdominal:     General: Bowel sounds are normal. There is no distension.     Palpations: Abdomen is soft. There is no mass.     Tenderness: There is no abdominal tenderness. There is no guarding or rebound.  Musculoskeletal:        General: No tenderness.     Cervical back: Normal range of motion and neck supple. No rigidity.     Right lower leg: No edema.     Left lower leg: No  edema.  Lymphadenopathy:     Cervical: No cervical adenopathy.  Skin:    Findings: No erythema or rash.  Neurological:     Mental Status: Mental status is at baseline.     Cranial Nerves: No cranial nerve deficit.     Motor: No abnormal muscle tone.     Coordination: Coordination normal.  Deep Tendon Reflexes: Reflexes normal.  Psychiatric:        Behavior: Behavior normal.        Thought Content: Thought content normal.        Judgment: Judgment normal.    L ear partial wax, TM is ok R ear - impacted   Lab Results  Component Value Date   WBC 7.0 07/02/2024   HGB 13.9 07/02/2024   HCT 41.5 07/02/2024   PLT 216.0 07/02/2024   GLUCOSE 94 07/02/2024   CHOL 274 (H) 02/15/2024   TRIG 181.0 (H) 02/15/2024   HDL 72.00 02/15/2024   LDLDIRECT 98.0 08/13/2021   LDLCALC 166 (H) 02/15/2024   ALT 23 07/02/2024   AST 28 07/02/2024   NA 132 (L) 07/02/2024   K 4.4 07/02/2024   CL 97 07/02/2024   CREATININE 0.76 07/02/2024   BUN 11 07/02/2024   CO2 18 (L) 07/02/2024   TSH 1.63 07/02/2024   HGBA1C 5.5 06/13/2020    MM 3D SCREENING MAMMOGRAM BILATERAL BREAST Result Date: 04/19/2024 CLINICAL DATA:  Screening. EXAM: DIGITAL SCREENING BILATERAL MAMMOGRAM WITH TOMOSYNTHESIS AND CAD TECHNIQUE: Bilateral screening digital craniocaudal and mediolateral oblique mammograms were obtained. Bilateral screening digital breast tomosynthesis was performed. The images were evaluated with computer-aided detection. COMPARISON:  Previous exam(s). ACR Breast Density Category b: There are scattered areas of fibroglandular density. FINDINGS: There are no findings suspicious for malignancy. IMPRESSION: No mammographic evidence of malignancy. A result letter of this screening mammogram will be mailed directly to the patient. RECOMMENDATION: Screening mammogram in one year. (Code:SM-B-01Y) BI-RADS CATEGORY  1: Negative. Electronically Signed   By: Craig Farr M.D.   On: 04/19/2024 15:16    Assessment &  Plan:   Problem List Items Addressed This Visit     Allergic rhinitis due to pollen   Worse Stop steroid nasal spray x 1 week Use Afrin x 1 week Depo-medrol  80 mg IM      Essential hypertension, benign    Take Norvasc  2.5 mg daily  SBP ok at home      Upper respiratory infection - Primary   Will get a CXR Stop steroid nasal spray x 1 week Use Afrin x 1 week Depo-medrol  80 mg IM Repeat Z pack if worse      Relevant Medications   azithromycin  (ZITHROMAX  Z-PAK) 250 MG tablet   Other Relevant Orders   DG Chest 2 View (Completed)      Meds ordered this encounter  Medications   guaiFENesin -dextromethorphan (ROBITUSSIN DM) 100-10 MG/5ML syrup    Sig: Take 5-10 mLs by mouth every 4 (four) hours as needed for cough (chest congestion).    Dispense:  240 mL    Refill:  0   azithromycin  (ZITHROMAX  Z-PAK) 250 MG tablet    Sig: As directed    Dispense:  6 tablet    Refill:  0      Follow-up: No follow-ups on file.  Marolyn Noel, MD

## 2024-07-16 ENCOUNTER — Encounter: Payer: Self-pay | Admitting: Internal Medicine

## 2024-07-16 ENCOUNTER — Other Ambulatory Visit: Payer: Self-pay | Admitting: Family Medicine

## 2024-07-16 ENCOUNTER — Ambulatory Visit (INDEPENDENT_AMBULATORY_CARE_PROVIDER_SITE_OTHER): Admitting: Internal Medicine

## 2024-07-16 VITALS — BP 126/79 | HR 83 | Temp 98.6°F | Ht 60.0 in | Wt 116.0 lb

## 2024-07-16 DIAGNOSIS — J3089 Other allergic rhinitis: Secondary | ICD-10-CM

## 2024-07-16 DIAGNOSIS — E785 Hyperlipidemia, unspecified: Secondary | ICD-10-CM | POA: Diagnosis not present

## 2024-07-16 DIAGNOSIS — F02A4 Dementia in other diseases classified elsewhere, mild, with anxiety: Secondary | ICD-10-CM

## 2024-07-16 DIAGNOSIS — J310 Chronic rhinitis: Secondary | ICD-10-CM

## 2024-07-16 DIAGNOSIS — G301 Alzheimer's disease with late onset: Secondary | ICD-10-CM

## 2024-07-16 DIAGNOSIS — T148XXA Other injury of unspecified body region, initial encounter: Secondary | ICD-10-CM

## 2024-07-16 DIAGNOSIS — M47812 Spondylosis without myelopathy or radiculopathy, cervical region: Secondary | ICD-10-CM

## 2024-07-16 NOTE — Patient Instructions (Addendum)
 [employer] com: Qunol Liquid Turmeric Curcumin with Black Pepper, Turmeric Supplement 1000mg , Extra Strength, Joint Health, 40 Servings, 20.3 fl oz Visit the [employer] 4.6 4.6 out of 5 stars    15,952 ratings 3K+ bought in past month Limited time deal on first delivery $22.48 with 25 percent savings-25% $22.48 $1.11 per Fl Oz($1.11 / Fl Oz)  Put Arnica cream on the hematoma on the buttock  We can do trigger point injections in your neck muscles   AERLANG Massage Gun with Heat Deep Tissue Back Massager Neck Massager for Pain Relief,Muscle Percussion Massage Gun, Scientist, forensic for Men Women Dad him Child psychotherapist Gun with 7Heads&Silent Visit the [facility] Store 4.4 4.4 out of 5 stars    20,787 ratings [employer]  3 sustainability features  7K+ bought in past month $28.99 with 26 percent savings-26% $28.99 $28.99 per count($28.99 / count) List Price: $39.00List Price: $39.00  Get Fast, Free Shipping with [employer]

## 2024-07-16 NOTE — Progress Notes (Signed)
 New  Subjective:  Patient ID: [name], female    DOB: Jun 17, 1936  Age: 88 y.o. MRN: [id number]  CC: Medical Management of Chronic Issues (Pt states her chest congestion and cough has returned )   HPI [name] presents for cold sx's came back after her trip to [place]: chest hurts, achy shoulders, neck  C/o pain in the steroid shot area C/o OA and pain  Outpatient Medications Prior to Visit  Medication Sig Dispense Refill   acetaminophen  (TYLENOL ) 500 MG tablet Take 1,000 mg by mouth every 6 (six) hours as needed for mild pain.     albuterol  (VENTOLIN  HFA) 108 (90 Base) MCG/ACT inhaler Take 2 puffs every 4-6 hours as needed     amLODipine  (NORVASC ) 2.5 MG tablet Take 2.5 mg by mouth as needed.     Ascorbic Acid (VITAMIN C ) 100 MG tablet Take 1 tablet (100 mg total) by mouth daily. 100 tablet 1   azelastine  (ASTELIN ) 0.1 % nasal spray 1 spray in each nostril Nasally Once a day in the evening     Azelastine -Fluticasone  137-50 MCG/ACT SUSP USE ONE SPRAY IN EACH NOSTRIL BID 23 g 3   azithromycin  (ZITHROMAX  Z-PAK) 250 MG tablet As directed 6 tablet 0   Calcium  Carbonate-Vitamin D  (CALCIUM  CARBONATE W/VITAMIN D  PO) Take 1 tablet by mouth daily.     cyanocobalamin  (VITAMIN B12) 1000 MCG tablet Take 1 tablet (1,000 mcg total) by mouth daily. 100 tablet 3   desonide  (DESOWEN ) 0.05 % cream Apply 1 application topically as needed.     EPINEPHrine  (EPIPEN  2-PAK) 0.3 mg/0.3 mL IJ SOAJ injection Inject 0.3 mLs (0.3 mg total) into the muscle as needed. 1 Device 1   erythromycin  ophthalmic ointment Place 1 Application into both eyes as needed.     famotidine  (PEPCID ) 40 MG tablet Take 40 mg by mouth as needed for heartburn or indigestion.     guaiFENesin -dextromethorphan (ROBITUSSIN DM) 100-10 MG/5ML syrup Take 5-10 mLs by mouth every 4 (four) hours as needed for cough (chest congestion). 240 mL 0   hydrocortisone  (ANUSOL -HC) 2.5 % rectal cream Place 1 Application rectally as needed for hemorrhoids or  anal itching.     ipratropium (ATROVENT ) 0.06 % nasal spray Place 2 sprays into both nostrils 2 (two) times daily as needed for rhinitis.     Lactase (LACTAID PO) Take by mouth.     levothyroxine  (SYNTHROID ) 50 MCG tablet TAKE ONE TABLET BY MOUTH DAILY BEFORE BREAKFAST 30 tablet 5   LORazepam  (ATIVAN ) 1 MG tablet Take 2-3 tablets at bedtime and 1-2 tablets in the daytime as needed. 150 tablet 2   Magnesium  Sulfate (EPSOM SALT) POWD UAD for warm water soaks 500 g 0   MegaRed Omega-3 Krill Oil 500 MG CAPS Take 1 capsule by mouth every morning. 100 capsule 3   memantine  (NAMENDA  XR) 14 MG CP24 24 hr capsule Take 1 capsule (14 mg total) by mouth at bedtime. 90 capsule 11   mirabegron  ER (MYRBETRIQ ) 25 MG TB24 tablet Take 1 tablet (25 mg total) by mouth daily. 90 tablet 0   montelukast  (SINGULAIR ) 10 MG tablet Take 1 tablet (10 mg total) by mouth at bedtime. 90 tablet 1   pantoprazole  (PROTONIX ) 40 MG tablet TAKE ONE TABLET BY MOUTH TWICE DAILY BEFORE MEALS 60 tablet 5   polyethylene glycol powder (GLYCOLAX /MIRALAX ) 17 GM/SCOOP powder Take 17 g by mouth 2 (two) times daily as needed for moderate constipation. 500 g 3   psyllium (METAMUCIL) 58.6 %  packet Take 1 packet by mouth as needed.     triamcinolone  ointment (KENALOG ) 0.1 % Apply 1 Application topically 3 (three) times daily. As needed for itching 80 g 2   UNABLE TO FIND Med Name: CBD oil - under tongue     levocetirizine (XYZAL ) 5 MG tablet Take 1 tablet (5 mg total) by mouth every evening. 30 tablet 2   No facility-administered medications prior to visit.    ROS: Review of Systems  Constitutional:  Positive for fatigue. Negative for activity change, appetite change, chills and unexpected weight change.  HENT:  Positive for congestion, postnasal drip and rhinorrhea. Negative for mouth sores and sinus pressure.   Eyes:  Negative for visual disturbance.  Respiratory:  Positive for cough. Negative for chest tightness, shortness of breath and  wheezing.   Gastrointestinal:  Negative for abdominal pain and nausea.  Genitourinary:  Negative for difficulty urinating, frequency and vaginal pain.  Musculoskeletal:  Negative for back pain and gait problem.  Skin:  Negative for pallor and rash.  Neurological:  Negative for dizziness, tremors, weakness, numbness and headaches.  Psychiatric/Behavioral:  Negative for confusion and sleep disturbance.     Objective:  BP 126/79   Pulse 83   Temp 98.6 F (37 C) (Oral)   Ht 5' (1.524 m)   Wt 116 lb (52.6 kg)   SpO2 96%   BMI 22.65 kg/m   BP Readings from Last 3 Encounters:  07/16/24 126/79  07/11/24 130/78  07/02/24 136/78    Wt Readings from Last 3 Encounters:  07/16/24 116 lb (52.6 kg)  07/02/24 106 lb (48.1 kg)  05/22/24 108 lb (49 kg)    Physical Exam Constitutional:      General: She is not in acute distress.    Appearance: Normal appearance. She is well-developed.  HENT:     Head: Normocephalic.     Right Ear: External ear normal.     Left Ear: External ear normal.     Nose: Congestion and rhinorrhea present.     Mouth/Throat:     Pharynx: Posterior oropharyngeal erythema present. No oropharyngeal exudate.  Eyes:     General:        Right eye: No discharge.        Left eye: No discharge.     Conjunctiva/sclera: Conjunctivae normal.     Pupils: Pupils are equal, round, and reactive to light.  Neck:     Thyroid : No thyromegaly.     Vascular: No JVD.     Trachea: No tracheal deviation.  Cardiovascular:     Rate and Rhythm: Normal rate and regular rhythm.     Heart sounds: Normal heart sounds.  Pulmonary:     Effort: No respiratory distress.     Breath sounds: No stridor. No wheezing.  Abdominal:     General: Bowel sounds are normal. There is no distension.     Palpations: Abdomen is soft. There is no mass.     Tenderness: There is no abdominal tenderness. There is no guarding or rebound.  Musculoskeletal:        General: Tenderness present.     Cervical  back: Normal range of motion and neck supple. No rigidity.  Lymphadenopathy:     Cervical: No cervical adenopathy.  Skin:    Findings: No erythema or rash.  Neurological:     Cranial Nerves: No cranial nerve deficit.     Motor: No abnormal muscle tone.     Coordination: Coordination normal.  Deep Tendon Reflexes: Reflexes normal.  Psychiatric:        Behavior: Behavior normal.        Thought Content: Thought content normal.        Judgment: Judgment normal.   Neck is stiff, very tense traps  Lab Results  Component Value Date   WBC 7.0 07/02/2024   HGB 13.9 07/02/2024   HCT 41.5 07/02/2024   PLT 216.0 07/02/2024   GLUCOSE 94 07/02/2024   CHOL 274 (H) 02/15/2024   TRIG 181.0 (H) 02/15/2024   HDL 72.00 02/15/2024   LDLDIRECT 98.0 08/13/2021   LDLCALC 166 (H) 02/15/2024   ALT 23 07/02/2024   AST 28 07/02/2024   NA 132 (L) 07/02/2024   K 4.4 07/02/2024   CL 97 07/02/2024   CREATININE 0.76 07/02/2024   BUN 11 07/02/2024   CO2 18 (L) 07/02/2024   TSH 1.63 07/02/2024   HGBA1C 5.5 06/13/2020    MM 3D SCREENING MAMMOGRAM BILATERAL BREAST Result Date: 04/19/2024 CLINICAL DATA:  Screening. EXAM: DIGITAL SCREENING BILATERAL MAMMOGRAM WITH TOMOSYNTHESIS AND CAD TECHNIQUE: Bilateral screening digital craniocaudal and mediolateral oblique mammograms were obtained. Bilateral screening digital breast tomosynthesis was performed. The images were evaluated with computer-aided detection. COMPARISON:  Previous exam(s). ACR Breast Density Category b: There are scattered areas of fibroglandular density. FINDINGS: There are no findings suspicious for malignancy. IMPRESSION: No mammographic evidence of malignancy. A result letter of this screening mammogram will be mailed directly to the patient. RECOMMENDATION: Screening mammogram in one year. (Code:SM-B-01Y) BI-RADS CATEGORY  1: Negative. Electronically Signed   By: Craig Farr M.D.   On: 04/19/2024 15:16    Assessment & Plan:   Problem  List Items Addressed This Visit     Allergic rhinitis - Primary   Worse. .  Use Afrin as needed.  Continue with the Dymista       Alzheimer's dementia [facility])   We will continue on current meds. Neurology f/u w/Sarah Dina  Not taking Tramadol       Bruising   Use Arnica cream for bruises      Cervical spondylosis   Consider a trigger point injection in the tendon strap muscles.  Use heating rice pad Try massage gun.  Try turmeric liquid      Dyslipidemia   Hold simvastatin  due to arthralgias         No orders of the defined types were placed in this encounter.     Follow-up: No follow-ups on file.  Marolyn Noel, MD

## 2024-07-20 DIAGNOSIS — H00014 Hordeolum externum left upper eyelid: Secondary | ICD-10-CM | POA: Diagnosis not present

## 2024-07-22 NOTE — Assessment & Plan Note (Addendum)
 Consider a trigger point injection in the tendon strap muscles.  Use heating rice pad Try massage gun.  Try turmeric liquid

## 2024-07-22 NOTE — Assessment & Plan Note (Signed)
 Worse. .  Use Afrin as needed.  Continue with the Dymista 

## 2024-07-22 NOTE — Assessment & Plan Note (Signed)
 We will continue on current meds. Neurology f/u w/Sarah Alane Allen  Not taking Tramadol 

## 2024-07-22 NOTE — Assessment & Plan Note (Addendum)
 Hold simvastatin  due to arthralgias

## 2024-07-22 NOTE — Assessment & Plan Note (Signed)
 Use Arnica cream for bruises

## 2024-07-25 ENCOUNTER — Other Ambulatory Visit: Payer: Self-pay | Admitting: *Deleted

## 2024-07-25 DIAGNOSIS — M810 Age-related osteoporosis without current pathological fracture: Secondary | ICD-10-CM

## 2024-07-25 MED ORDER — DENOSUMAB 60 MG/ML ~~LOC~~ SOSY
60.0000 mg | PREFILLED_SYRINGE | SUBCUTANEOUS | Status: AC
  Administered 2024-08-06: 60 mg via SUBCUTANEOUS

## 2024-07-31 ENCOUNTER — Encounter: Payer: Self-pay | Admitting: Internal Medicine

## 2024-07-31 ENCOUNTER — Ambulatory Visit (INDEPENDENT_AMBULATORY_CARE_PROVIDER_SITE_OTHER): Admitting: Internal Medicine

## 2024-07-31 VITALS — BP 132/72 | HR 74 | Temp 98.2°F | Ht 60.0 in | Wt 116.0 lb

## 2024-07-31 DIAGNOSIS — M47812 Spondylosis without myelopathy or radiculopathy, cervical region: Secondary | ICD-10-CM | POA: Diagnosis not present

## 2024-07-31 DIAGNOSIS — R6889 Other general symptoms and signs: Secondary | ICD-10-CM | POA: Diagnosis not present

## 2024-07-31 DIAGNOSIS — M5030 Other cervical disc degeneration, unspecified cervical region: Secondary | ICD-10-CM

## 2024-07-31 DIAGNOSIS — J3089 Other allergic rhinitis: Secondary | ICD-10-CM

## 2024-07-31 DIAGNOSIS — M542 Cervicalgia: Secondary | ICD-10-CM

## 2024-07-31 MED ORDER — METHYLPREDNISOLONE ACETATE 80 MG/ML IJ SUSP
80.0000 mg | Freq: Once | INTRAMUSCULAR | Status: AC
  Administered 2024-07-31: 80 mg via INTRAMUSCULAR

## 2024-07-31 NOTE — Patient Instructions (Signed)
 [employer] com: Qunol Liquid Turmeric Curcumin with Black Pepper, Turmeric Supplement 1000mg , Extra Strength, Joint Health, 40 Servings, 20.3 fl oz Visit the [employer] 4.6 4.6 out of 5 stars    15,952 ratings 3K+ bought in past month Limited time deal on first delivery $22.48 with 25 percent savings-25% $22.48 $1.11 per Fl Oz($1.11 / Fl Oz)      AERLANG Massage Gun with Heat Deep Tissue Back Massager Neck Massager for Pain Relief,Muscle Percussion Massage Gun, Scientist, forensic for Men Women Dad him Child psychotherapist Gun with 7Heads&Silent Visit the [facility] 4.4 4.4 out of 5 stars    20,787 ratings [employer]  3 sustainability features  7K+ bought in past month $28.99 with 26 percent savings-26% $28.99 $28.99 per count($28.99 / count) List Price: $39.00List Price: $39.00  Get Fast, Free Shipping with [employer]

## 2024-07-31 NOTE — Progress Notes (Signed)
 Subjective:  Patient ID: [name], female    DOB: Feb 22, 1936  Age: 88 y.o. MRN: [id number]  CC: Acute Visit (Arthritis has been bothering her the last 2 weeks or so. Tylenol  is no longer giving relief) and Nasal Congestion (Has been having some PND and chest congestion. Denies SOB and chest pains. Dry cough, 1 week)   HPI [name] presents for B trap and neck pain, nasal congestion  Outpatient Medications Prior to Visit  Medication Sig Dispense Refill   acetaminophen  (TYLENOL ) 500 MG tablet Take 1,000 mg by mouth every 6 (six) hours as needed for mild pain.     albuterol  (VENTOLIN  HFA) 108 (90 Base) MCG/ACT inhaler Take 2 puffs every 4-6 hours as needed     amLODipine  (NORVASC ) 2.5 MG tablet Take 2.5 mg by mouth as needed.     Ascorbic Acid (VITAMIN C ) 100 MG tablet Take 1 tablet (100 mg total) by mouth daily. 100 tablet 1   azelastine  (ASTELIN ) 0.1 % nasal spray 1 spray in each nostril Nasally Once a day in the evening     Azelastine -Fluticasone  137-50 MCG/ACT SUSP USE ONE SPRAY IN EACH NOSTRIL BID 23 g 3   azithromycin  (ZITHROMAX  Z-PAK) 250 MG tablet As directed 6 tablet 0   Calcium  Carbonate-Vitamin D  (CALCIUM  CARBONATE W/VITAMIN D  PO) Take 1 tablet by mouth daily.     cyanocobalamin  (VITAMIN B12) 1000 MCG tablet Take 1 tablet (1,000 mcg total) by mouth daily. 100 tablet 3   desonide  (DESOWEN ) 0.05 % cream Apply 1 application topically as needed.     EPINEPHrine  (EPIPEN  2-PAK) 0.3 mg/0.3 mL IJ SOAJ injection Inject 0.3 mLs (0.3 mg total) into the muscle as needed. 1 Device 1   erythromycin  ophthalmic ointment Place 1 Application into both eyes as needed.     famotidine  (PEPCID ) 40 MG tablet Take 40 mg by mouth as needed for heartburn or indigestion.     guaiFENesin -dextromethorphan (ROBITUSSIN DM) 100-10 MG/5ML syrup Take 5-10 mLs by mouth every 4 (four) hours as needed for cough (chest congestion). 240 mL 0   hydrocortisone  (ANUSOL -HC) 2.5 % rectal cream Place 1 Application  rectally as needed for hemorrhoids or anal itching.     ipratropium (ATROVENT ) 0.06 % nasal spray Place 2 sprays into both nostrils 2 (two) times daily as needed for rhinitis.     Lactase (LACTAID PO) Take by mouth.     levocetirizine (XYZAL ) 5 MG tablet TAKE ONE TABLET BY MOUTH IN THE EVENING 30 tablet 2   Magnesium  Sulfate (EPSOM SALT) POWD UAD for warm water soaks 500 g 0   MegaRed Omega-3 Krill Oil 500 MG CAPS Take 1 capsule by mouth every morning. 100 capsule 3   memantine  (NAMENDA  XR) 14 MG CP24 24 hr capsule Take 1 capsule (14 mg total) by mouth at bedtime. 90 capsule 11   mirabegron  ER (MYRBETRIQ ) 25 MG TB24 tablet Take 1 tablet (25 mg total) by mouth daily. 90 tablet 0   montelukast  (SINGULAIR ) 10 MG tablet Take 1 tablet (10 mg total) by mouth at bedtime. 90 tablet 1   pantoprazole  (PROTONIX ) 40 MG tablet TAKE ONE TABLET BY MOUTH TWICE DAILY BEFORE MEALS 60 tablet 5   polyethylene glycol powder (GLYCOLAX /MIRALAX ) 17 GM/SCOOP powder Take 17 g by mouth 2 (two) times daily as needed for moderate constipation. 500 g 3   psyllium (METAMUCIL) 58.6 % packet Take 1 packet by mouth as needed.     triamcinolone  ointment (KENALOG ) 0.1 % Apply 1  Application topically 3 (three) times daily. As needed for itching 80 g 2   UNABLE TO FIND Med Name: CBD oil - under tongue     levothyroxine  (SYNTHROID ) 50 MCG tablet TAKE ONE TABLET BY MOUTH DAILY BEFORE BREAKFAST 30 tablet 5   LORazepam  (ATIVAN ) 1 MG tablet Take 2-3 tablets at bedtime and 1-2 tablets in the daytime as needed. 150 tablet 2   denosumab  (PROLIA ) injection 60 mg      No facility-administered medications prior to visit.    ROS: Review of Systems  Constitutional:  Positive for fatigue.  HENT:  Negative for ear discharge.   Cardiovascular:  Negative for leg swelling.  Musculoskeletal:  Positive for arthralgias, back pain, gait problem, neck pain and neck stiffness.  Hematological:  Bruises/bleeds easily.  Psychiatric/Behavioral:   Positive for decreased concentration and sleep disturbance. Negative for confusion. The patient is nervous/anxious.     Objective:  BP 132/72 (BP Location: Left Arm, Patient Position: Sitting)   Pulse 74   Temp 98.2 F (36.8 C) (Temporal)   Ht 5' (1.524 m)   Wt 116 lb (52.6 kg)   SpO2 95%   BMI 22.65 kg/m   BP Readings from Last 3 Encounters:  07/31/24 132/72  07/16/24 126/79  07/11/24 130/78    Wt Readings from Last 3 Encounters:  07/31/24 116 lb (52.6 kg)  07/16/24 116 lb (52.6 kg)  07/02/24 106 lb (48.1 kg)    Physical Exam Constitutional:      Appearance: Normal appearance. She is not toxic-appearing.  Musculoskeletal:        General: Tenderness present.  Neurological:     Mental Status: Mental status is at baseline.     Coordination: Coordination abnormal.     Gait: Gait abnormal.   Using a walker B traps and paraspinal muscles over neck and upper thor spine area were     Procedure Note :    Trigger Point Injection:   Indication : Focal tender area identifiable by the location without other identifiable neurologic or musculoskeletal finding or pathology.   Risks including unsuccessful procedure , bleeding, infection, bruising, skin atrophy and others were explained to the patient in detail as well as the benefits. Informed consent was obtained and signed.   Tthe patient was placed in a comfortable position.   6 points of maximum tenderness over paraspinal and trapezius muscles were marked and  the skin was prepped with Betadine and alcohol. 1 inch 25-gauge needle was used. The needle was advanced perpendicular to the skin. Each trigger point was injected with 1 mL of 2% lidocaine  and about 10 mg of Depo-Medrol  in a usual fashion.  Band-Aids applied.   Tolerated well. Complications: None. Good pain relief following the procedure.    Lab Results  Component Value Date   WBC 7.0 07/02/2024   HGB 13.9 07/02/2024   HCT 41.5 07/02/2024   PLT 216.0 07/02/2024    GLUCOSE 94 07/02/2024   CHOL 274 (H) 02/15/2024   TRIG 181.0 (H) 02/15/2024   HDL 72.00 02/15/2024   LDLDIRECT 98.0 08/13/2021   LDLCALC 166 (H) 02/15/2024   ALT 23 07/02/2024   AST 28 07/02/2024   NA 132 (L) 07/02/2024   K 4.4 07/02/2024   CL 97 07/02/2024   CREATININE 0.76 07/02/2024   BUN 11 07/02/2024   CO2 18 (L) 07/02/2024   TSH 1.63 07/02/2024   HGBA1C 5.5 06/13/2020    MM 3D SCREENING MAMMOGRAM BILATERAL BREAST Result Date: 04/19/2024 CLINICAL DATA:  Screening.  EXAM: DIGITAL SCREENING BILATERAL MAMMOGRAM WITH TOMOSYNTHESIS AND CAD TECHNIQUE: Bilateral screening digital craniocaudal and mediolateral oblique mammograms were obtained. Bilateral screening digital breast tomosynthesis was performed. The images were evaluated with computer-aided detection. COMPARISON:  Previous exam(s). ACR Breast Density Category b: There are scattered areas of fibroglandular density. FINDINGS: There are no findings suspicious for malignancy. IMPRESSION: No mammographic evidence of malignancy. A result letter of this screening mammogram will be mailed directly to the patient. RECOMMENDATION: Screening mammogram in one year. (Code:SM-B-01Y) BI-RADS CATEGORY  1: Negative. Electronically Signed   By: Craig Farr M.D.   On: 04/19/2024 15:16    Assessment & Plan:   Problem List Items Addressed This Visit     Allergic rhinitis   Worse. .  Use Afrin as needed.  Continue with the Dymista       Cervical spondylosis - Primary   Relevant Orders   Ambulatory referral to Rheumatology   Degenerative disc disease, cervical   Relevant Orders   Ambulatory referral to Rheumatology   Neck pain   See procedure        Other Visit Diagnoses       Flu-like symptoms             Meds ordered this encounter  Medications   methylPREDNISolone  acetate (DEPO-MEDROL ) injection 80 mg      Follow-up: Return in about 6 weeks (around 09/11/2024) for a follow-up visit.  Marolyn Noel, MD

## 2024-07-31 NOTE — Assessment & Plan Note (Signed)
 See procedure

## 2024-07-31 NOTE — Assessment & Plan Note (Signed)
 Worse. .  Use Afrin as needed.  Continue with the Dymista 

## 2024-08-06 ENCOUNTER — Ambulatory Visit (INDEPENDENT_AMBULATORY_CARE_PROVIDER_SITE_OTHER)

## 2024-08-06 DIAGNOSIS — M810 Age-related osteoporosis without current pathological fracture: Secondary | ICD-10-CM | POA: Diagnosis not present

## 2024-08-06 NOTE — Progress Notes (Signed)
 Prolia  injection even in the right SQ arm. Pt tolerated it well.

## 2024-08-07 ENCOUNTER — Other Ambulatory Visit: Payer: Self-pay | Admitting: Internal Medicine

## 2024-08-07 NOTE — Addendum Note (Signed)
 Addended by: Shelvie Salsberry on: 08/07/2024 10:33 AM   Modules accepted: Orders

## 2024-08-09 ENCOUNTER — Other Ambulatory Visit: Payer: Self-pay | Admitting: Internal Medicine

## 2024-08-09 MED ORDER — LORAZEPAM 1 MG PO TABS: ORAL_TABLET | ORAL | 2 refills | Status: AC

## 2024-08-09 NOTE — Telephone Encounter (Signed)
 Pt called back for update on her refill. She asked to speak directly with cma because the request has been pending. Please call and advise.

## 2024-08-09 NOTE — Telephone Encounter (Signed)
 Copied from CRM [id number]. Topic: Clinical - Prescription Issue >> Aug 09, 2024  2:02 PM Terri MATSU wrote: Reason for CRM: Brooke from [employer] is calling regarding medication LORazepam  (ATIVAN ) 1 MG for patient. They submitted it on 9/9 (keep saying there is an active order and is getting denied but they dont see anything on their end. Patient is out of the medication and needs it ASAP. Callback number [phone]

## 2024-08-22 ENCOUNTER — Other Ambulatory Visit: Payer: Self-pay | Admitting: Internal Medicine

## 2024-08-24 ENCOUNTER — Ambulatory Visit: Payer: Self-pay

## 2024-08-24 NOTE — Telephone Encounter (Signed)
 FYI Only or Action Required?: Action required by provider: unsure.  Patient was last seen in primary care on 07/31/2024 by Plotnikov, Karlynn GAILS, MD.  Called Nurse Triage reporting Vaginal Pain.  Symptoms began several days ago.  Interventions attempted: estradioL  (ESTRACE ) 0.01 % (0.1 mg/gram) vaginal cream  .  Symptoms are: unchanged.  Triage Disposition: See Physician Within 24 Hours  Patient/caregiver understands and will follow disposition?: Yes - pt is going to call urology and may call back for an appt with Dr. Garald.                         Copied from CRM #[id number]. Topic: Clinical - Red Word Triage >> Aug 24, 2024 12:49 PM Harlene ORN wrote: Red Word that prompted transfer to Nurse Triage: Would like to speak to Dr. Garald. Extreme discomfort in her vaginal area for two days. Reason for Disposition  [1] MILD to MODERATE vaginal pain AND [2] present > 24 hours  (Exception: Chronic pain.)  Answer Assessment - Initial Assessment Questions 1. SYMPTOM: What's the main symptom you're concerned about? (e.g., pain, itching, dryness)     discomfort 2. LOCATION: Where is the  discomfort located? (e.g., inside/outside, left/right)     Vaginal area 3. ONSET: When did the  discomfort  start?     2 days 4. PAIN: Is there any pain? If Yes, ask: How bad is it? (Scale: 1-10; mild, moderate, severe)     no 5. ITCHING: Is there any itching? If Yes, ask: How bad is it? (Scale: 1-10; mild, moderate, severe)     no 6. CAUSE: What do you think is causing the discharge? Have you had the same problem before? What happened then?     unsure 7. OTHER SYMPTOMS: Do you have any other symptoms? (e.g., fever, itching, vaginal bleeding, pain with urination, injury to genital area, vaginal foreign body)     no  Protocols used: Vaginal Symptoms-A-AH

## 2024-08-27 ENCOUNTER — Telehealth: Payer: Self-pay

## 2024-08-27 NOTE — Telephone Encounter (Signed)
 Pt call triage line leaving VM stating she is having  a problem and would like a call back.   I called pt back to get more information however, I had to LM asking her to call our office back.   I did ask pt to leave a detailed message on the VM to let us  know what kind of problem she is having.

## 2024-09-02 ENCOUNTER — Encounter: Payer: Self-pay | Admitting: Internal Medicine

## 2024-09-03 ENCOUNTER — Other Ambulatory Visit (HOSPITAL_COMMUNITY)
Admission: RE | Admit: 2024-09-03 | Discharge: 2024-09-03 | Disposition: A | Discharge status: Home / Self Care | Source: Ambulatory Visit | Attending: Obstetrics and Gynecology | Admitting: Obstetrics and Gynecology

## 2024-09-03 ENCOUNTER — Ambulatory Visit (INDEPENDENT_AMBULATORY_CARE_PROVIDER_SITE_OTHER): Admitting: Obstetrics and Gynecology

## 2024-09-03 ENCOUNTER — Encounter: Payer: Self-pay | Admitting: Obstetrics and Gynecology

## 2024-09-03 VITALS — BP 126/84 | HR 83

## 2024-09-03 DIAGNOSIS — M810 Age-related osteoporosis without current pathological fracture: Secondary | ICD-10-CM | POA: Diagnosis not present

## 2024-09-03 DIAGNOSIS — N760 Acute vaginitis: Secondary | ICD-10-CM

## 2024-09-03 DIAGNOSIS — N3281 Overactive bladder: Secondary | ICD-10-CM | POA: Diagnosis not present

## 2024-09-03 DIAGNOSIS — N952 Postmenopausal atrophic vaginitis: Secondary | ICD-10-CM | POA: Diagnosis not present

## 2024-09-03 DIAGNOSIS — N949 Unspecified condition associated with female genital organs and menstrual cycle: Secondary | ICD-10-CM | POA: Diagnosis not present

## 2024-09-03 MED ORDER — ESTRADIOL 0.1 MG/GM VA CREA: TOPICAL_CREAM | VAGINAL | 0 refills | Status: DC

## 2024-09-03 NOTE — Patient Instructions (Signed)
 Phone number for scheduling bone density at [place], [phone].

## 2024-09-03 NOTE — Progress Notes (Unsigned)
 GYNECOLOGY  VISIT   HPI: 88 y.o.   Widow  Caucasian female   [id number] with No LMP recorded. Patient is postmenopausal.   here for: Vaginal discomfort, itching, and some pain, started 1 week ago, used a vaginal estradiol  cream but has not helped.    Using vaginal estrogen cream 2 grams pv at hs twice a week at bedtime.    Using for a couple of weeks.   Some vaginal irritation and itching.   No odor.  No bleeding.    No dysuria.    On myrbetriq .    Bowel movements daily.   Not sexually active.     GYNECOLOGIC HISTORY: No LMP recorded. Patient is postmenopausal. Contraception:  PMP Menopausal hormone therapy:  Estrace   Last 2 paps:  08/07/21 neg, 07/03/20 neg  History of abnormal Pap or positive HPV:  no Mammogram:  04/16/24 Breast Density Cat B, BIRADS Cat 1 neg         OB History     Gravida  3   Para  2   Term  2   Preterm      AB  1   Living  2      SAB  1   IAB      Ectopic      Multiple      Live Births                 Patient Active Problem List   Diagnosis Date Noted   Paronychia of left middle finger 05/22/2024   Spasm of right trapezius muscle 05/03/2024   Postnasal drip 04/30/2024   Mild cognitive impairment with memory loss [G31.84] 04/16/2024   Abrasion of skin 04/09/2024   Episode of confusion 04/02/2024   Allergic rhinitis 01/24/2024   Wheezing 01/24/2024   Statin intolerance 01/09/2024   Chronic low back pain 11/28/2023   Acute pain of right shoulder 11/28/2023   Dyslipidemia 11/28/2023   Alzheimer's dementia (HCC) 09/15/2023   CHF (congestive heart failure)    Hypotension 07/13/2023   Skin ulcer of nose 07/13/2023   Chalazion left upper eyelid 04/18/2023   Bruising 04/06/2023   Pain of right sacroiliac joint 01/14/2023   Lumbar degenerative disc disease 12/21/2022   Chronic left SI joint pain 11/30/2022   Inflammation of sacroiliac joint 11/16/2022   Stool incontinence 11/16/2022   Grief 10/06/2022   History of basal  cell carcinoma 09/29/2022   Stress at home 07/21/2022   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   Gait disorder 02/28/2022   Melanocytic nevi of trunk 02/22/2022   Seborrheic dermatitis 02/22/2022   Urticaria 02/22/2022   Concussion 10/29/2021   Allergic rhinitis due to animal (cat) (dog) hair and dander 08/07/2021   Allergic rhinitis due to pollen 08/07/2021   Mild intermittent asthma 08/07/2021   Cervical spine instability 05/15/2021   Cervical spinal cord compression 05/04/2021   Degenerative disc disease, cervical 03/11/2021   Spondylolisthesis, cervical region 03/11/2021   Trochanteric bursitis of right hip 09/22/2020   Meningioma 03/25/2020   Cholelithiasis 09/09/2019   Cervical spondylosis 01/05/2019   Neck pain 12/26/2018   Generalized anxiety disorder 08/22/2018   Contact dermatitis and eczema 06/14/2018   Constipation 11/11/2016   Sinusitis, chronic 08/06/2016   Rash 04/20/2016   Aortic valve sclerosis 03/12/2015   Fatigue 09/08/2014   Increased frequency of urination 08/19/2014   Urinary urgency 08/19/2014   Postherpetic neuralgia 10/02/2013   Cerumen impaction 07/16/2013   Insomnia 01/24/2013   Upper respiratory infection 08/03/2012  Pruritus of skin 03/11/2012   MVP (mitral valve prolapse)    Multiple thyroid  nodules 10/04/2011   Herpes zoster 09/07/2011   Essential hypertension, benign 07/27/2011   Atrophic vaginitis    Hyperlipemia, mixed 03/15/2011   OAB (overactive bladder) 09/02/2010   Hypothyroidism 03/03/2010   Irritable bowel syndrome 09/04/2008   History of colonic polyps 09/04/2008   Atrophic gastritis 09/03/2008   Duodenitis 09/03/2008   Tibialis tendinitis 03/20/2008   Osteoarthritis 02/25/2008   Lactose intolerance 11/20/2007   GERD (gastroesophageal reflux disease) 11/20/2007   Osteoporosis 11/20/2007    Past Medical History:  Diagnosis Date   Allergic rhinitis due to animal (cat) (dog) hair and dander 08/07/2021   Allergic rhinitis  due to pollen 08/07/2021   Aortic valve sclerosis 03/12/2015   Atrophic gastritis 09/03/2008   Atrophic vaginitis    Baker's cyst    Left-Dr. Melodi   Bladder pain 08/19/2014   Chronic  Take Vesicare  10 mg/d  UA was ok  On Premarin cream PV per GYN  On Vesicare   F/u w/Dr JONELLE Moats     Cataract    Dr. Octavia   Cervical spinal cord compression 05/04/2021   Cervical spine instability 05/15/2021   Dr. Unice: plan -  posterior C1-2 decompression and instrumented fusion.  MRI: Progressive soft tissue pannus at C1-2 is now creating mass  effect on the craniocervical junction with distortion of the upper  spinal cord. This is likely related rheumatoid arthritis  C spine CT: Fracture of the anterior arch of C1 with 12 mm separation. Nondisplaced fracture posterior arch of C1. There is later   Cervical spondylosis 01/05/2019   Chalazion left upper eyelid 04/18/2023   X2  Keflex  po - too big... Will use Ceftin   F/u w/Ophthalmology   CHF (congestive heart failure)    Cholelithiasis 09/09/2019   Per Dr Abran: At this point we mutually decided to watch her abdominal complaints.  If symptoms accelerate or become more classic for symptomatic cholelithiasis, then she is agreeable to surgical referral.  She will keep me posted   Chronic left SI joint pain 11/30/2022   Closed fracture of first cervical vertebra 08/07/2021   Closed fracture of second cervical vertebra 08/07/2021   Concussion 10/29/2021   s/p a hard fall after she rushed and slipped on the wet marble floor while visiting family in [place]  a couple week ago.  She hit the side of her skull, had a skin laceration bled quite a bit.  She was dazed and according to her son [name] had a loss of consciousness of under a minute duration.  She felt dazed.  She was taken to ER.  2 staples were applied to her skin laceration on the left o   Constipation 11/11/2016   Chronic, multifactorial  Miralax  prn  7/22 Postop constipation related to oxycodone  and  Robaxin .  We can discontinue oxycodone  Robaxin .  The patient will use tramadol  and Tylenol  instead.  Use MiraLAX  or Senokot as to produce soft regular stools.  Discontinue Pepto-Bismol.  I think this should help with abdominal bloating complaint.      Take Metamucil daily  Senokot as to produce soft regular sto   Contact dermatitis and eczema 06/14/2018   Eczema vs other - 7/19  Cortaid prn  Depo-medrol  IM 80 mg  Remove nail polish     Contusion of left chest wall 01/09/2015   Kaydon fell and broke a rib #8 on the L last week, she had a rib X ray and  a CT (Dr Jane).   Contusion of right knee 08/12/2016   R knee   Degenerative disc disease, cervical 03/11/2021   Duodenitis 09/03/2008   Essential hypertension, benign 07/27/2011   Chronic, mild  NAS diet  Cardiac CT scan for calcium  scoring offered 1/20  1/24 HTN and s/p ER visit on 12/08/22 for SBP 180. Per Dr Randol:  Discussed with the patient that her restarting donepezil  could have impacted her symptoms today.  However, her blood pressure continues to be mildly elevated.  Will place her on 2.5 mg amlodipine  to take if her blood pressure is over 170/100.  Nursing aide at   Fatigue 09/08/2014   10/15, 2/16, 9/17, 2/18  No other sx  Shyla is managing it with short rest periods 1-2/d     Fibroid    Gait disorder 02/28/2022   Worse  Multifactorial  Cont w/PT  In the balance class  Balance class q 1 week     [name] resolved after doing exercises by Dr Harvey 11/2021     Generalized anxiety disorder 08/22/2018   Chronic   Lexapro  - not taking  Weighted blanket  Lorazepam  prn, - tolerance has developed   Potential benefits of a long term benzodiazepines  use as well as potential risks  and complications were explained to the patient and were aknowledged.     GERD (gastroesophageal reflux disease) 11/20/2007   Chronic. Better on Gluten free diet  Dexilant    Potential benefits of a long term PPI  use as well as potential risks  and complications were  explained to the patient and were aknowledged.  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     History of basal cell carcinoma 09/29/2022   History of colonic polyps 09/04/2008   Hyperlipemia, mixed 03/15/2011   Chronic  On Simvastatin      Hypotension 07/13/2023   Stop Amlodipine      Hypothyroidism    Impacted cerumen of right ear 07/16/2013   Increased frequency of urination 08/19/2014   Inflammation of sacroiliac joint 11/16/2022   Insomnia 01/24/2013   Lorazepam  prn   Potential benefits of a long term benzodiazepines  use as well as potential risks  and complications were explained to the patient and were aknowledged.        Irritable bowel syndrome 09/04/2008   Chronic   Abran MD, Norleen SAILOR  2013 - much better on gluten free diet              Lactose intolerance 11/20/2007   Lactaid        Lumbar degenerative disc disease 12/21/2022   Based on MRI and her exam there is significant DDD in lumbar spine     Lumbar spondylosis    Melanocytic nevi of trunk 02/22/2022   Meningioma 03/25/2020   Dr Unice     Mild cognitive impairment with memory loss 09/15/2023   Mild intermittent asthma 08/07/2021   Multiple thyroid  nodules 10/04/2011   2013 she was started on Synthroid  - tol well  2017, 2022 US  OK     MVP (mitral valve prolapse)    Antibiotics required for dental procedures   Neck pain 12/26/2018   Chronic  PT, traction, TENs  unit was offered  Tramadol  prn   Potential benefits of a long term opioids use as well as potential risks (i.e. addiction risk, apnea etc) and complications (i.e. Somnolence, constipation and others) were explained to the patient and were aknowledged.        OAB (overactive bladder) 09/02/2010   Dr Nicholaus  Use Solifenacin  prn  Worse - ?multifactorial  UA was normal  Camie increased  Memantine  to 10 mg bid a month ago     Osteoarthritis 02/25/2008   Dr Bonner  Blue-Emu cream was recommended to use 2-3 times a day        Osteoporosis 11/20/2007   Chronic  Per GYN        Overactive bladder    Pain of right sacroiliac joint 01/14/2023   Postherpetic neuralgia 10/02/2013   Pruritus of skin 03/11/2012   S/p H zoster vaccination  Relapsing  8/12 - on face  8/14 - R ear/face  10/14 ?     Sacroiliitis    Seborrheic dermatitis 02/22/2022   Sinusitis, chronic 08/06/2016   Worse  Flonase , Atrovent  nasal, Singulair , Claritin   ENT ref Dr Thaddeus  CT sinuses     Skin ulcer of nose 07/13/2023   Soft pads on the nose  Derm appt     Spondylolisthesis, cervical region 03/11/2021   Stool incontinence 11/16/2022   12/23  Worse.  Stool leakage - new  D/c Aricept   Abd X ray to rule out constipation     Tibialis tendinitis 03/20/2008   Torn meniscus    bilateral   Trochanteric bursitis of right hip 09/22/2020   Upper respiratory infection 08/03/2012   9/13 - 3 wks  2/17  11/18 - asthmatic bronchitis     Urinary urgency 08/19/2014   2023 worse.  Obtain urinalysis.  D/c Aricept   UA was ok  On Premarin cream PV per GYN  On Vesicare   F/u w/Dr JONELLE Nicholaus     Urticaria 02/22/2022    Past Surgical History:  Procedure Laterality Date   CATARACT EXTRACTION, BILATERAL     EYE SURGERY     POSTERIOR CERVICAL FUSION/FORAMINOTOMY N/A 05/15/2021   Procedure: Cervical One Laminectomy with resection of cyst, Fixation from Occiput to Cervical Four;  Surgeon: Unice Pac, MD;  Location: [facility] OR;  Service: Neurosurgery;  Laterality: N/A;   TONSILLECTOMY      Current Outpatient Medications  Medication Sig Dispense Refill   acetaminophen  (TYLENOL ) 500 MG tablet Take 1,000 mg by mouth every 6 (six) hours as needed for mild pain.     albuterol  (VENTOLIN  HFA) 108 (90 Base) MCG/ACT inhaler Take 2 puffs every 4-6 hours as needed     amLODipine  (NORVASC ) 2.5 MG tablet Take 2.5 mg by mouth as needed.      Ascorbic Acid (VITAMIN C ) 100 MG tablet Take 1 tablet (100 mg total) by mouth daily. 100 tablet 1   azelastine  (ASTELIN ) 0.1 % nasal spray 1 spray in each nostril Nasally Once a day in the evening     Azelastine -Fluticasone  137-50 MCG/ACT SUSP USE ONE SPRAY IN EACH NOSTRIL BID 23 g 3   Calcium  Carbonate-Vitamin D  (CALCIUM  CARBONATE W/VITAMIN D  PO) Take 1 tablet by mouth daily.     cyanocobalamin  (VITAMIN B12) 1000 MCG tablet Take 1 tablet (1,000 mcg total) by mouth daily. 100 tablet 3   desonide  (DESOWEN ) 0.05 % cream Apply 1 application topically as needed.     EPINEPHrine  (EPIPEN  2-PAK) 0.3 mg/0.3 mL IJ SOAJ injection Inject 0.3 mLs (0.3  mg total) into the muscle as needed. 1 Device 1   famotidine  (PEPCID ) 40 MG tablet Take 40 mg by mouth as needed for heartburn or indigestion.     guaiFENesin -dextromethorphan (ROBITUSSIN DM) 100-10 MG/5ML syrup Take 5-10 mLs by mouth every 4 (four) hours as needed for cough (chest congestion). 240 mL 0   hydrocortisone  (ANUSOL -HC) 2.5 % rectal cream Place 1 Application rectally as needed for hemorrhoids or anal itching.     ipratropium (ATROVENT ) 0.06 % nasal spray Place 2 sprays into both nostrils 2 (two) times daily as needed for rhinitis.     Lactase (LACTAID PO) Take by mouth.     levocetirizine (XYZAL ) 5 MG tablet TAKE ONE TABLET BY MOUTH IN THE EVENING 30 tablet 2   levothyroxine  (SYNTHROID ) 50 MCG tablet TAKE ONE TABLET BY MOUTH DAILY BEFORE BREAKFAST 30 tablet 5   LORazepam  (ATIVAN ) 1 MG tablet Take 2-3 tablets at bedtime and 1-2 tablets in the daytime as needed. 150 tablet 2   Magnesium  Sulfate (EPSOM SALT) POWD UAD for warm water soaks 500 g 0   MegaRed Omega-3 Krill Oil 500 MG CAPS Take 1 capsule by mouth every morning. 100 capsule 3   memantine  (NAMENDA  XR) 14 MG CP24 24 hr capsule Take 1 capsule (14 mg total) by mouth at bedtime. 90 capsule 11   mirabegron  ER (MYRBETRIQ ) 25 MG TB24 tablet Take 1 tablet (25 mg total) by mouth daily. 90 tablet 0    montelukast  (SINGULAIR ) 10 MG tablet Take 1 tablet (10 mg total) by mouth at bedtime. 90 tablet 1   pantoprazole  (PROTONIX ) 40 MG tablet TAKE ONE TABLET BY MOUTH TWICE DAILY BEFORE MEALS 60 tablet 5   polyethylene glycol powder (GLYCOLAX /MIRALAX ) 17 GM/SCOOP powder Take 17 g by mouth 2 (two) times daily as needed for moderate constipation. 500 g 3   psyllium (METAMUCIL) 58.6 % packet Take 1 packet by mouth as needed.     triamcinolone  ointment (KENALOG ) 0.1 % Apply 1 Application topically 3 (three) times daily. As needed for itching 80 g 2   UNABLE TO FIND Med Name: CBD oil - under tongue     azithromycin  (ZITHROMAX  Z-PAK) 250 MG tablet As directed (Patient not taking: Reported on 09/03/2024) 6 tablet 0   erythromycin  ophthalmic ointment Place 1 Application into both eyes as needed. (Patient not taking: Reported on 09/03/2024)     estradiol  (ESTRACE ) 0.1 MG/GM vaginal cream Place 2 grams per vagina at bedtime twice a week. 42.5 g 0   No current facility-administered medications for this visit.     ALLERGIES: Bee venom, Bacitracin -polymyxin b, Clarithromycin, Diclofenac , Doxycycline , Neosporin [neomycin -bacitracin  zn-polymyx], and Hydrocortisone   Family History  Problem Relation Age of Onset   Heart disease Mother    Hypertension Mother    Osteoporosis Mother    Congestive Heart Failure Mother    Pancreatic cancer Father    Heart attack Brother    Drug abuse Brother        over dose    Healthy Son    Healthy Son    Colon cancer Neg Hx    Colon polyps Neg Hx    Rectal cancer Neg Hx    Stomach cancer Neg Hx    Breast cancer Neg Hx     Social History   Socioeconomic History   Marital status: Married    Spouse name: Not on file   Number of children: 2   Years of education: 15   Highest education level: Some college, no  degree  Occupational History   Occupation: retired    Associate Professor: RETIRED  Tobacco Use   Smoking status: Former    Current packs/day: 0.00    Types: Cigarettes     Start date: 1963    Quit date: 1973    Years since quitting: 52.7    Passive exposure: Never   Smokeless tobacco: Never   Tobacco comments:    Former smoker 06/25/22  Vaping Use   Vaping status: Never Used  Substance and Sexual Activity   Alcohol use: Yes    Alcohol/week: 1.0 standard drink of alcohol    Types: 1 Glasses of wine per week    Comment: Socially   Drug use: No   Sexual activity: Not Currently    Birth control/protection: Post-menopausal, None    Comment: 1st intercourse 88 yo-Fewer than 5 partners  Other Topics Concern   Not on file  Social History Narrative   Regular exercise-yesDaily caffeine use   Right handed   Lives alone   retired   Social Drivers of Corporate investment banker Strain: Low Risk  (05/07/2024)   Overall Financial Resource Strain (CARDIA)    Difficulty of Paying Living Expenses: Not hard at all  Food Insecurity: Low Risk  (06/04/2024)   Received from [facility]   Hunger Vital Sign    Within the past 12 months, you worried that your food would run out before you got money to buy more: Never true    Within the past 12 months, the food you bought just didn't last and you didn't have money to get more. : Never true  Transportation Needs: No Transportation Needs (06/04/2024)   Received from [facility]    In the past 12 months, has lack of reliable transportation kept you from medical appointments, meetings, work or from getting things needed for daily living? : No  Physical Activity: Sufficiently Active (05/07/2024)   Exercise Vital Sign    Days of Exercise per Week: 5 days    Minutes of Exercise per Session: 30 min  Stress: No Stress Concern Present (05/07/2024)   [employer] of Occupational Health - Occupational Stress Questionnaire    Feeling of Stress : Not at all  Social Connections: Moderately Integrated (05/07/2024)   Social Connection and Isolation Panel    Frequency of Communication with Friends and Family:  Twice a week    Frequency of Social Gatherings with Friends and Family: Twice a week    Attends Religious Services: More than 4 times per year    Active Member of [employer] or Organizations: Yes    Attends Banker Meetings: More than 4 times per year    Marital Status: Widowed  Intimate Partner Violence: Not At Risk (05/07/2024)   Humiliation, Afraid, Rape, and Kick questionnaire    Fear of Current or Ex-Partner: No    Emotionally Abused: No    Physically Abused: No    Sexually Abused: No    Review of Systems  See HPI.  PHYSICAL EXAMINATION:   BP 126/84 (BP Location: Left Arm, Patient Position: Sitting)   Pulse 83   SpO2 97%     General appearance: alert, cooperative and appears stated age    Pelvic: External genitalia:  no lesions              Urethra:  normal appearing urethra with no masses, tenderness or lesions              Bartholins and  Skenes: normal                 Vagina: normal appearing vagina with normal color and discharge, no lesions              Cervix: no lesions                Bimanual Exam:  Uterus:  normal size, contour, position, consistency, mobility, non-tender              Adnexa: no mass, fullness, tenderness              Rectal exam: yes.  Confirms.              Anus:  normal sphincter tone, no lesions  Chaperone was present for exam:  Kari HERO, CMA  ASSESSMENT:  Vaginitis. Vaginal discomfort.  Overactive bladder.  Osteoporosis.  On Prolia .   PLAN:  Urinalysis:  sg 1.015, ph 6.5, negative.  No UC sent.  Nuswab for vaginitis testing.  Rx for Estrace  cream 2 grams pv at hs twice weekly.  Disp:  42.5 gram, RF 0.  BMD at [facility].  Follow up for annual exam and follow up of bone density.   {LABS (Optional):23779}  ***  total time was spent for this patient encounter, including preparation, face-to-face counseling with the patient, coordination of care, and documentation of the encounter.

## 2024-09-04 ENCOUNTER — Telehealth: Payer: Self-pay

## 2024-09-04 DIAGNOSIS — Z23 Encounter for immunization: Secondary | ICD-10-CM | POA: Diagnosis not present

## 2024-09-04 DIAGNOSIS — N949 Unspecified condition associated with female genital organs and menstrual cycle: Secondary | ICD-10-CM | POA: Diagnosis not present

## 2024-09-04 DIAGNOSIS — N3281 Overactive bladder: Secondary | ICD-10-CM | POA: Diagnosis not present

## 2024-09-04 LAB — URINALYSIS, COMPLETE W/RFL CULTURE
Bacteria, UA: NONE SEEN /HPF
Bilirubin Urine: NEGATIVE
Glucose, UA: NEGATIVE
Hgb urine dipstick: NEGATIVE
Hyaline Cast: NONE SEEN /LPF
Ketones, ur: NEGATIVE
Leukocyte Esterase: NEGATIVE
Nitrites, Initial: NEGATIVE
Protein, ur: NEGATIVE
RBC / HPF: NONE SEEN /HPF (ref 0–2)
Specific Gravity, Urine: 1.015 (ref 1.001–1.035)
WBC, UA: NONE SEEN /HPF (ref 0–5)
pH: 6.5 (ref 5.0–8.0)

## 2024-09-04 LAB — NO CULTURE INDICATED

## 2024-09-04 NOTE — Telephone Encounter (Signed)
 Copied from CRM #[id number]. Topic: General - Other >> Sep 04, 2024 10:49 AM Berneda FALCON wrote: Reason for CRM: Patient would like a call back from nurse or PCP. She states she was told by PCP that she does not need to get the COVID Vaccine, but should get the flu shot. When she told her family this information they are all upset and would like to know why this is.  Please call patient back at [phone]

## 2024-09-05 ENCOUNTER — Other Ambulatory Visit: Payer: Self-pay | Admitting: Internal Medicine

## 2024-09-05 ENCOUNTER — Ambulatory Visit: Admitting: Rheumatology

## 2024-09-05 ENCOUNTER — Ambulatory Visit: Payer: Self-pay | Admitting: Obstetrics and Gynecology

## 2024-09-05 DIAGNOSIS — K219 Gastro-esophageal reflux disease without esophagitis: Secondary | ICD-10-CM

## 2024-09-05 LAB — CERVICOVAGINAL ANCILLARY ONLY
Bacterial Vaginitis (gardnerella): NEGATIVE
Candida Glabrata: NEGATIVE
Candida Vaginitis: POSITIVE — AB
Comment: NEGATIVE
Comment: NEGATIVE
Comment: NEGATIVE
Comment: NEGATIVE
Trichomonas: NEGATIVE

## 2024-09-05 NOTE — Telephone Encounter (Signed)
 Patient left message on triage line requesting update on estradiol  vaginal cream Rx.   Spoke with Sari at [facility], was advised RX was received. I was advised that same Rx was filled on 08/23/24, written by another provider, same medication and instructions, too early to refill.   Call placed to patient, left message as seen above. Return call to office if any additional assistance is needed.   Encounter closed.

## 2024-09-05 NOTE — Telephone Encounter (Signed)
 [name] saw Dr. Nikki on 06/03/2024 for vaginitis.  Thanks

## 2024-09-06 MED ORDER — TERCONAZOLE 0.4 % VA CREA: 1.0000 | TOPICAL_CREAM | Freq: Every day | VAGINAL | 0 refills | Status: DC

## 2024-09-07 NOTE — Telephone Encounter (Signed)
 Spoke to patient and informed her that Dr. Garald tends to not push the covid vaccine unless patients actually want it.

## 2024-09-11 ENCOUNTER — Other Ambulatory Visit (HOSPITAL_BASED_OUTPATIENT_CLINIC_OR_DEPARTMENT_OTHER): Payer: Self-pay

## 2024-09-11 DIAGNOSIS — Z23 Encounter for immunization: Secondary | ICD-10-CM | POA: Diagnosis not present

## 2024-09-11 MED ORDER — COMIRNATY 30 MCG/0.3ML IM SUSY
0.3000 mL | PREFILLED_SYRINGE | Freq: Once | INTRAMUSCULAR | 0 refills | Status: AC
  Filled 2024-09-11: qty 0.3, 1d supply, fill #0

## 2024-09-12 ENCOUNTER — Telehealth: Payer: Self-pay | Admitting: Internal Medicine

## 2024-09-12 ENCOUNTER — Telehealth: Payer: Self-pay

## 2024-09-12 ENCOUNTER — Other Ambulatory Visit: Payer: Self-pay | Admitting: Internal Medicine

## 2024-09-12 ENCOUNTER — Other Ambulatory Visit (HOSPITAL_COMMUNITY): Payer: Self-pay

## 2024-09-12 ENCOUNTER — Ambulatory Visit: Admitting: Internal Medicine

## 2024-09-12 ENCOUNTER — Encounter: Payer: Self-pay | Admitting: Internal Medicine

## 2024-09-12 VITALS — BP 132/74 | HR 82 | Temp 98.1°F | Ht 60.0 in | Wt 104.5 lb

## 2024-09-12 DIAGNOSIS — F02A4 Dementia in other diseases classified elsewhere, mild, with anxiety: Secondary | ICD-10-CM | POA: Diagnosis not present

## 2024-09-12 DIAGNOSIS — G301 Alzheimer's disease with late onset: Secondary | ICD-10-CM

## 2024-09-12 DIAGNOSIS — N761 Subacute and chronic vaginitis: Secondary | ICD-10-CM | POA: Diagnosis not present

## 2024-09-12 DIAGNOSIS — J310 Chronic rhinitis: Secondary | ICD-10-CM

## 2024-09-12 LAB — CBC WITH DIFFERENTIAL/PLATELET
Basophils Absolute: 0.1 K/uL (ref 0.0–0.1)
Basophils Relative: 0.9 % (ref 0.0–3.0)
Eosinophils Absolute: 0.1 K/uL (ref 0.0–0.7)
Eosinophils Relative: 1.4 % (ref 0.0–5.0)
HCT: 38.1 % (ref 36.0–46.0)
Hemoglobin: 13 g/dL (ref 12.0–15.0)
Lymphocytes Relative: 11.6 % — ABNORMAL LOW (ref 12.0–46.0)
Lymphs Abs: 0.7 K/uL (ref 0.7–4.0)
MCHC: 34 g/dL (ref 30.0–36.0)
MCV: 95.7 fl (ref 78.0–100.0)
Monocytes Absolute: 0.6 K/uL (ref 0.1–1.0)
Monocytes Relative: 9.9 % (ref 3.0–12.0)
Neutro Abs: 4.8 K/uL (ref 1.4–7.7)
Neutrophils Relative %: 76.2 % (ref 43.0–77.0)
Platelets: 314 K/uL (ref 150.0–400.0)
RBC: 3.99 Mil/uL (ref 3.87–5.11)
RDW: 14.3 % (ref 11.5–15.5)
WBC: 6.4 K/uL (ref 4.0–10.5)

## 2024-09-12 LAB — COMPREHENSIVE METABOLIC PANEL WITH GFR
ALT: 15 U/L (ref 0–35)
AST: 20 U/L (ref 0–37)
Albumin: 4.4 g/dL (ref 3.5–5.2)
Alkaline Phosphatase: 38 U/L — ABNORMAL LOW (ref 39–117)
BUN: 12 mg/dL (ref 6–23)
CO2: 29 meq/L (ref 19–32)
Calcium: 9.3 mg/dL (ref 8.4–10.5)
Chloride: 96 meq/L (ref 96–112)
Creatinine, Ser: 0.62 mg/dL (ref 0.40–1.20)
GFR: 80.76 mL/min (ref >60.00)
Glucose, Bld: 93 mg/dL (ref 70–99)
Potassium: 3.9 meq/L (ref 3.5–5.1)
Sodium: 132 meq/L — ABNORMAL LOW (ref 135–145)
Total Bilirubin: 0.6 mg/dL (ref 0.2–1.2)
Total Protein: 7.4 g/dL (ref 6.0–8.3)

## 2024-09-12 LAB — T4, FREE: Free T4: 1.27 ng/dL (ref 0.60–1.60)

## 2024-09-12 MED ORDER — TERCONAZOLE 0.4 % VA CREA: 1.0000 | TOPICAL_CREAM | Freq: Every day | VAGINAL | 0 refills | Status: AC

## 2024-09-12 MED ORDER — ESTRADIOL 0.01 % VA CREA: TOPICAL_CREAM | VAGINAL | 12 refills | Status: DC

## 2024-09-12 MED ORDER — BECLOMETHASONE DIPROP MONOHYD 42 MCG/SPRAY NA SUSP: 2.0000 | Freq: Two times a day (BID) | NASAL | 11 refills | Status: DC

## 2024-09-12 MED ORDER — LEVOCETIRIZINE DIHYDROCHLORIDE 5 MG PO TABS
5.0000 mg | ORAL_TABLET | Freq: Every day | ORAL | 3 refills | Status: AC
Start: 2024-09-12 — End: ?

## 2024-09-12 NOTE — Telephone Encounter (Signed)
 Pt wanted to know why she bruise up so quick is that a health issue that she has to worry about pt is concern and would like a call from someone in clinical.. Please advise, Thanks

## 2024-09-12 NOTE — Progress Notes (Signed)
 Subjective:  Patient ID: [name], female    DOB: [birth year]  Age: 88 y.o. MRN: [id number]  CC: No chief complaint on file.   HPI ESLI CLEMENTS presents for postnasal drip - not better C/o vag dryness - Dr Nikki recommended Terazol 7 nightly in the vagina for 7 nights.  F/u on dementia  Outpatient Medications Prior to Visit  Medication Sig Dispense Refill   acetaminophen  (TYLENOL ) 500 MG tablet Take 1,000 mg by mouth every 6 (six) hours as needed for mild pain.     albuterol  (VENTOLIN  HFA) 108 (90 Base) MCG/ACT inhaler Take 2 puffs every 4-6 hours as needed     amLODipine  (NORVASC ) 2.5 MG tablet Take 2.5 mg by mouth as needed.     Ascorbic Acid (VITAMIN C ) 100 MG tablet Take 1 tablet (100 mg total) by mouth daily. 100 tablet 1   azelastine  (ASTELIN ) 0.1 % nasal spray 1 spray in each nostril Nasally Once a day in the evening     Calcium  Carbonate-Vitamin D  (CALCIUM  CARBONATE W/VITAMIN D  PO) Take 1 tablet by mouth daily.     COVID-19 mRNA vaccine, Pfizer, (COMIRNATY) syringe Inject 0.3 mLs into the muscle once for 1 dose. 0.3 mL 0   cyanocobalamin  (VITAMIN B12) 1000 MCG tablet Take 1 tablet (1,000 mcg total) by mouth daily. 100 tablet 3   desonide  (DESOWEN ) 0.05 % cream Apply 1 application topically as needed.     EPINEPHrine  (EPIPEN  2-PAK) 0.3 mg/0.3 mL IJ SOAJ injection Inject 0.3 mLs (0.3 mg total) into the muscle as needed. 1 Device 1   famotidine  (PEPCID ) 40 MG tablet Take 40 mg by mouth as needed for heartburn or indigestion.     guaiFENesin -dextromethorphan (ROBITUSSIN DM) 100-10 MG/5ML syrup Take 5-10 mLs by mouth every 4 (four) hours as needed for cough (chest congestion). 240 mL 0   ipratropium (ATROVENT ) 0.06 % nasal spray Place 2 sprays into both nostrils 2 (two) times daily as needed for rhinitis.     Lactase (LACTAID PO) Take by mouth.     levothyroxine  (SYNTHROID ) 50 MCG tablet TAKE ONE TABLET BY MOUTH DAILY BEFORE BREAKFAST 30 tablet 5   LORazepam  (ATIVAN ) 1 MG tablet  Take 2-3 tablets at bedtime and 1-2 tablets in the daytime as needed. 150 tablet 2   Magnesium  Sulfate (EPSOM SALT) POWD UAD for warm water soaks 500 g 0   MegaRed Omega-3 Krill Oil 500 MG CAPS Take 1 capsule by mouth every morning. 100 capsule 3   memantine  (NAMENDA  XR) 14 MG CP24 24 hr capsule Take 1 capsule (14 mg total) by mouth at bedtime. 90 capsule 11   mirabegron  ER (MYRBETRIQ ) 25 MG TB24 tablet Take 1 tablet (25 mg total) by mouth daily. 90 tablet 0   pantoprazole  (PROTONIX ) 40 MG tablet TAKE ONE TABLET BY MOUTH TWICE DAILY BEFORE MEALS 60 tablet 5   polyethylene glycol powder (GLYCOLAX /MIRALAX ) 17 GM/SCOOP powder Take 17 g by mouth 2 (two) times daily as needed for moderate constipation. 500 g 3   psyllium (METAMUCIL) 58.6 % packet Take 1 packet by mouth as needed.     triamcinolone  ointment (KENALOG ) 0.1 % Apply 1 Application topically 3 (three) times daily. As needed for itching 80 g 2   UNABLE TO FIND Med Name: CBD oil - under tongue     Azelastine -Fluticasone  137-50 MCG/ACT SUSP USE ONE SPRAY IN EACH NOSTRIL BID 23 g 3   estradiol  (ESTRACE ) 0.1 MG/GM vaginal cream Place 2 grams per vagina at  bedtime twice a week. 42.5 g 0   levocetirizine (XYZAL ) 5 MG tablet TAKE ONE TABLET BY MOUTH IN THE EVENING 30 tablet 2   montelukast  (SINGULAIR ) 10 MG tablet Take 1 tablet (10 mg total) by mouth at bedtime. 90 tablet 1   azithromycin  (ZITHROMAX  Z-PAK) 250 MG tablet As directed (Patient not taking: Reported on 09/12/2024) 6 tablet 0   erythromycin  ophthalmic ointment Place 1 Application into both eyes as needed. (Patient not taking: Reported on 09/12/2024)     hydrocortisone  (ANUSOL -HC) 2.5 % rectal cream Place 1 Application rectally as needed for hemorrhoids or anal itching. (Patient not taking: Reported on 09/12/2024)     terconazole (TERAZOL 7) 0.4 % vaginal cream Place 1 applicator vaginally at bedtime for 7 days. (Patient not taking: Reported on 09/12/2024) 45 g 0   No facility-administered  medications prior to visit.    ROS: Review of Systems  Constitutional:  Negative for activity change, appetite change, chills, fatigue and unexpected weight change.  HENT:  Negative for congestion, mouth sores and sinus pressure.   Eyes:  Negative for visual disturbance.  Respiratory:  Negative for cough and chest tightness.   Gastrointestinal:  Negative for abdominal pain and nausea.  Genitourinary:  Negative for difficulty urinating, frequency and vaginal pain.  Musculoskeletal:  Negative for back pain and gait problem.  Skin:  Negative for pallor and rash.  Neurological:  Negative for dizziness, tremors, weakness, numbness and headaches.  Psychiatric/Behavioral:  Negative for confusion, sleep disturbance and suicidal ideas.     Objective:  BP 132/74   Pulse 82   Temp 98.1 F (36.7 C) (Temporal)   Ht 5' (1.524 m)   Wt 104 lb 8 oz (47.4 kg)   SpO2 96%   BMI 20.41 kg/m   BP Readings from Last 3 Encounters:  09/12/24 132/74  09/03/24 126/84  07/31/24 132/72    Wt Readings from Last 3 Encounters:  09/12/24 104 lb 8 oz (47.4 kg)  07/31/24 116 lb (52.6 kg)  07/16/24 116 lb (52.6 kg)    Physical Exam Constitutional:      General: She is not in acute distress.    Appearance: Normal appearance. She is well-developed.  HENT:     Head: Normocephalic.     Right Ear: External ear normal.     Left Ear: External ear normal.     Nose: Nose normal.  Eyes:     General:        Right eye: No discharge.        Left eye: No discharge.     Conjunctiva/sclera: Conjunctivae normal.     Pupils: Pupils are equal, round, and reactive to light.  Neck:     Thyroid : No thyromegaly.     Vascular: No JVD.     Trachea: No tracheal deviation.  Cardiovascular:     Rate and Rhythm: Normal rate and regular rhythm.     Heart sounds: Normal heart sounds.  Pulmonary:     Effort: No respiratory distress.     Breath sounds: No stridor. No wheezing.  Abdominal:     General: Bowel sounds are  normal. There is no distension.     Palpations: Abdomen is soft. There is no mass.     Tenderness: There is no abdominal tenderness. There is no guarding or rebound.  Musculoskeletal:        General: No tenderness.     Cervical back: Normal range of motion and neck supple. No rigidity.  Lymphadenopathy:  Cervical: No cervical adenopathy.  Skin:    Findings: No erythema or rash.  Neurological:     Mental Status: Mental status is at baseline.     Cranial Nerves: No cranial nerve deficit.     Motor: No abnormal muscle tone.     Coordination: Coordination normal.     Deep Tendon Reflexes: Reflexes normal.  Psychiatric:        Behavior: Behavior normal.        Thought Content: Thought content normal.        Judgment: Judgment normal.   Pale nasal lining Using a cane  Lab Results  Component Value Date   WBC 7.0 07/02/2024   HGB 13.9 07/02/2024   HCT 41.5 07/02/2024   PLT 216.0 07/02/2024   GLUCOSE 94 07/02/2024   CHOL 274 (H) 02/15/2024   TRIG 181.0 (H) 02/15/2024   HDL 72.00 02/15/2024   LDLDIRECT 98.0 08/13/2021   LDLCALC 166 (H) 02/15/2024   ALT 23 07/02/2024   AST 28 07/02/2024   NA 132 (L) 07/02/2024   K 4.4 07/02/2024   CL 97 07/02/2024   CREATININE 0.76 07/02/2024   BUN 11 07/02/2024   CO2 18 (L) 07/02/2024   TSH 1.63 07/02/2024   HGBA1C 5.5 06/13/2020    No results found.  Assessment & Plan:   Problem List Items Addressed This Visit     Alzheimer's dementia [facility])   We will continue on current meds. Neurology f/u w/Sara Dina  On Namenda       Relevant Orders   CBC with Differential/Platelet   T4, free   Chronic rhinitis   Restart Xazal, Beconase nasal. Cont Astelin  spray Get a humidifier, ultrasonic      Relevant Medications   levocetirizine (XYZAL ) 5 MG tablet   Vaginitis - Primary   C/o vag dryness - Dr Nikki recommended Terazol 7 nightly in the vagina for 7 nights.  Estra cream prn      Relevant Orders   Comprehensive metabolic panel  with GFR   CBC with Differential/Platelet   T4, free      Meds ordered this encounter  Medications   beclomethasone (BECONASE-AQ) 42 MCG/SPRAY nasal spray    Sig: Place 2 sprays into both nostrils 2 (two) times daily. Dose is for each nostril.    Dispense:  25 g    Refill:  11   estradiol  (ESTRACE ) 0.01 % CREA vaginal cream    Sig: Use at bedtime x 1 week PV, then 1-2 times per week PV    Dispense:  42.5 g    Refill:  12   levocetirizine (XYZAL ) 5 MG tablet    Sig: Take 1 tablet (5 mg total) by mouth daily.    Dispense:  90 tablet    Refill:  3    This prescription was filled on 06/28/2024. Any refills authorized will be placed on file.   terconazole (TERAZOL 7) 0.4 % vaginal cream    Sig: Place 1 applicator vaginally at bedtime for 7 days.    Dispense:  45 g    Refill:  0    Jamera says she never got it - it was prescribed by Dr Nikki. Thank you      Follow-up: Return in about 2 months (around 11/12/2024) for a follow-up visit.  Marolyn Noel, MD

## 2024-09-12 NOTE — Assessment & Plan Note (Signed)
 We will continue on current meds. Neurology f/u w/Sara Dina  On Namenda 

## 2024-09-12 NOTE — Assessment & Plan Note (Addendum)
 Restart Xazal, Beconase nasal. Cont Astelin  spray Get a humidifier, ultrasonic

## 2024-09-12 NOTE — Patient Instructions (Addendum)
 FOR DRYNESS    Get a humidifier, ultrasonic  Humidifier large room 16L/4.2Gal - [place] Whole house Humidifier for Home1500 sq.ft. Ultrasonic Cool Mist Humidifiers for Bedroom with Extension Tube & Aroma Box & Night light, White Visit the [employer] 4.4 4.4 out of 5 stars    548 ratings

## 2024-09-12 NOTE — Telephone Encounter (Signed)
 Pharmacy Patient Advocate Encounter   Received notification from Patient Pharmacy that prior authorization for Beconase AQ 42mcg nasal spray is required/requested.   Insurance verification completed.   The patient is insured through [employer].   Per test claim:  Fluticasone  50mcg nasal spray is preferred by the insurance.  If suggested medication is appropriate, Please send in a new RX and discontinue this one. If not, please advise as to why it's not appropriate so that we may request a Prior Authorization. Please note, some preferred medications may still require a PA.  If the suggested medications have not been trialed and there are no contraindications to their use, the PA will not be submitted, as it will not be approved.   Beconase AQ has been discontinued and Qnasl is not on the formulary.   Please advise.

## 2024-09-12 NOTE — Assessment & Plan Note (Signed)
 C/o vag dryness - Dr Nikki recommended Terazol 7 nightly in the vagina for 7 nights.  Estra cream prn

## 2024-09-13 ENCOUNTER — Ambulatory Visit: Payer: Self-pay | Admitting: Internal Medicine

## 2024-09-14 ENCOUNTER — Telehealth: Payer: Self-pay

## 2024-09-14 MED ORDER — FLUTICASONE PROPIONATE 50 MCG/ACT NA SUSP: 2.0000 | Freq: Every day | NASAL | 6 refills | Status: DC

## 2024-09-14 NOTE — Telephone Encounter (Signed)
 I've tried to reach pt twice to inform her of providers response to bruising concerns as follows Bruising is a common issue. Please see 1 of us  if worse. Thank you

## 2024-09-14 NOTE — Telephone Encounter (Signed)
 Per pts PCP Bruising is a common issue. Please see 1 of us  if worse. Thank you

## 2024-09-14 NOTE — Telephone Encounter (Signed)
 Copied from CRM #[id number]. Topic: Clinical - Medication Question >> Sep 13, 2024  9:24 AM Martinique E wrote: Reason for CRM: Patient called in stating that [facility] does not have beclomethasone (BECONASE-AQ) 42 MCG/SPRAY nasal spray, patient questioning if a different nasal spray could get called in for her.

## 2024-09-14 NOTE — Telephone Encounter (Signed)
 Bruising is a common issue.  Please see 1 of us  if worse.  Thank you

## 2024-09-14 NOTE — Telephone Encounter (Signed)
 It has been addressed.  I changed the prescription to Flonase .  Thank you

## 2024-09-14 NOTE — Telephone Encounter (Signed)
 Okay to try Flonase .  Thank you

## 2024-09-17 NOTE — Telephone Encounter (Signed)
Order changed to Flonase.

## 2024-09-25 ENCOUNTER — Telehealth: Payer: Self-pay

## 2024-09-25 NOTE — Telephone Encounter (Signed)
 Pt called in stating that she used Terconazole vaginal cream 0.4% cream today. She wanted to know if that was ok. She states that she feels Funny down there when I asked what does she mean she just said she just said it feels uncomfortable. She denies itching. Is it ok for the pt to use the Terconazole cream? Should be come in for a visit to be evaluated?   Please advise

## 2024-09-25 NOTE — Telephone Encounter (Signed)
 I recommend an office visit.  Needs urinalysis and reflex culture and examination.

## 2024-09-26 ENCOUNTER — Encounter: Payer: Self-pay | Admitting: Radiology

## 2024-09-26 ENCOUNTER — Ambulatory Visit: Admitting: Radiology

## 2024-09-26 VITALS — BP 130/72 | HR 85 | Ht 60.0 in | Wt 104.0 lb

## 2024-09-26 DIAGNOSIS — N949 Unspecified condition associated with female genital organs and menstrual cycle: Secondary | ICD-10-CM

## 2024-09-26 DIAGNOSIS — N898 Other specified noninflammatory disorders of vagina: Secondary | ICD-10-CM | POA: Diagnosis not present

## 2024-09-26 DIAGNOSIS — R351 Nocturia: Secondary | ICD-10-CM | POA: Diagnosis not present

## 2024-09-26 MED ORDER — ESTRADIOL 0.01 % VA CREA: TOPICAL_CREAM | VAGINAL | 12 refills | Status: DC

## 2024-09-26 NOTE — Telephone Encounter (Signed)
 Spoke with patient, advised per Dr. Nikki. Patient agreeable to OV with first available provider. Scheduled for today at 1330 with Jami.   Routing to provider for final review. Patient is agreeable to disposition. Will close encounter.  Cc: Jami

## 2024-09-26 NOTE — Telephone Encounter (Signed)
 Left message to call [facility] Triage at [phone], option 4.

## 2024-09-26 NOTE — Progress Notes (Signed)
 Subjective: [name] is a 88 y.o. female who complains of continued discomfort in vagina, using estradiol  cream also used terconazole cream for just a few days prescribed by PCP. States 'vagina doesn't feel right, it is uncomfortable'.    Review of Systems  All other systems reviewed and are negative.   Past Medical History:  Diagnosis Date   Allergic rhinitis due to animal (cat) (dog) hair and dander 08/07/2021   Allergic rhinitis due to pollen 08/07/2021   Aortic valve sclerosis 03/12/2015   Atrophic gastritis 09/03/2008   Atrophic vaginitis    Baker's cyst    Left-Dr. Aluisio   Bladder pain 08/19/2014   Chronic  Take Vesicare  10 mg/d  UA was ok  On Premarin cream PV per GYN  On Vesicare   F/u w/Dr JONELLE Moats     Cataract    Dr. Octavia   Cervical spinal cord compression 05/04/2021   Cervical spine instability 05/15/2021   Dr. Unice: plan -  posterior C1-2 decompression and instrumented fusion.  MRI: Progressive soft tissue pannus at C1-2 is now creating mass  effect on the craniocervical junction with distortion of the upper  spinal cord. This is likely related rheumatoid arthritis  C spine CT: Fracture of the anterior arch of C1 with 12 mm separation. Nondisplaced fracture posterior arch of C1. There is later   Cervical spondylosis 01/05/2019   Chalazion left upper eyelid 04/18/2023   X2  Keflex  po - too big... Will use Ceftin   F/u w/Ophthalmology   CHF (congestive heart failure)    Cholelithiasis 09/09/2019   Per Dr Abran: At this point we mutually decided to watch her abdominal complaints.  If symptoms accelerate or become more classic for symptomatic cholelithiasis, then she is agreeable to surgical referral.  She will keep me posted   Chronic left SI joint pain 11/30/2022   Closed fracture of first cervical vertebra 08/07/2021   Closed fracture of second cervical vertebra 08/07/2021   Concussion 10/29/2021   s/p a hard fall after she rushed and slipped on the wet  marble floor while visiting family in [place]  a couple week ago.  She hit the side of her skull, had a skin laceration bled quite a bit.  She was dazed and according to her son [name] had a loss of consciousness of under a minute duration.  She felt dazed.  She was taken to ER.  2 staples were applied to her skin laceration on the left o   Constipation 11/11/2016   Chronic, multifactorial  Miralax  prn  7/22 Postop constipation related to oxycodone  and Robaxin .  We can discontinue oxycodone  Robaxin .  The patient will use tramadol  and Tylenol  instead.  Use MiraLAX  or Senokot as to produce soft regular stools.  Discontinue Pepto-Bismol.  I think this should help with abdominal bloating complaint.      Take Metamucil daily  Senokot as to produce soft regular sto   Contact dermatitis and eczema 06/14/2018   Eczema vs other - 7/19  Cortaid prn  Depo-medrol  IM 80 mg  Remove nail polish     Contusion of left chest wall 01/09/2015   Shellye fell and broke a rib #8 on the L last week, she had a rib X ray and a CT (Dr Jane).   Contusion of right knee 08/12/2016   R knee   Degenerative disc disease, cervical 03/11/2021   Duodenitis 09/03/2008   Essential hypertension, benign 07/27/2011   Chronic, mild  NAS diet  Cardiac CT scan for calcium  scoring offered 1/20  1/24 HTN and s/p ER visit on 12/08/22 for SBP 180. Per Dr Randol:  Discussed with the patient that her restarting donepezil  could have impacted her symptoms today.  However, her blood pressure continues to be mildly elevated.  Will place her on 2.5 mg amlodipine  to take if her blood pressure is over 170/100.  Nursing aide at   Fatigue 09/08/2014   10/15, 2/16, 9/17, 2/18  No other sx  Amylia is managing it with short rest periods 1-2/d     Fibroid    Gait disorder 02/28/2022   Worse  Multifactorial  Cont w/PT  In the balance class  Balance class q 1 week     [name] resolved after doing exercises by Dr Harvey 11/2021     Generalized anxiety disorder 08/22/2018    Chronic   Lexapro  - not taking  Weighted blanket  Lorazepam  prn, - tolerance has developed   Potential benefits of a long term benzodiazepines  use as well as potential risks  and complications were explained to the patient and were aknowledged.     GERD (gastroesophageal reflux disease) 11/20/2007   Chronic. Better on Gluten free diet  Dexilant    Potential benefits of a long term PPI  use as well as potential risks  and complications were explained to the patient and were aknowledged.  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     History of basal cell carcinoma 09/29/2022   History of colonic polyps 09/04/2008   Hyperlipemia, mixed 03/15/2011   Chronic  On Simvastatin      Hypotension 07/13/2023   Stop Amlodipine      Hypothyroidism    Impacted cerumen of right ear 07/16/2013   Increased frequency of urination 08/19/2014   Inflammation of sacroiliac joint 11/16/2022   Insomnia 01/24/2013   Lorazepam  prn   Potential benefits of a long term benzodiazepines  use as well as potential risks  and complications were explained to the patient and were aknowledged.        Irritable bowel syndrome 09/04/2008   Chronic   Abran MD, Norleen SAILOR  2013 - much better on gluten free diet              Lactose intolerance 11/20/2007   Lactaid        Lumbar degenerative disc disease 12/21/2022   Based on MRI and her exam there is significant DDD in lumbar spine     Lumbar spondylosis    Melanocytic nevi of trunk 02/22/2022   Meningioma 03/25/2020   Dr Unice     Mild cognitive impairment with memory loss 09/15/2023   Mild intermittent asthma 08/07/2021   Multiple thyroid  nodules 10/04/2011   2013 she was started on Synthroid  - tol well  2017, 2022 US  OK     MVP (mitral valve prolapse)    Antibiotics required for dental  procedures   Neck pain 12/26/2018   Chronic  PT, traction, TENs unit was offered  Tramadol  prn   Potential benefits of a long term opioids use as well as potential risks (i.e. addiction risk, apnea etc) and complications (i.e. Somnolence, constipation and others) were explained to the patient and  were aknowledged.        OAB (overactive bladder) 09/02/2010   Dr Nicholaus  Use Solifenacin  prn  Worse - ?multifactorial  UA was normal  Camie increased Memantine  to 10 mg bid a month ago     Osteoarthritis 02/25/2008   Dr Bonner  Blue-Emu cream was recommended to use 2-3 times a day        Osteoporosis 11/20/2007   Chronic  Per GYN        Overactive bladder    Pain of right sacroiliac joint 01/14/2023   Postherpetic neuralgia 10/02/2013   Pruritus of skin 03/11/2012   S/p H zoster vaccination  Relapsing  8/12 - on face  8/14 - R ear/face  10/14 ?     Sacroiliitis    Seborrheic dermatitis 02/22/2022   Sinusitis, chronic 08/06/2016   Worse  Flonase , Atrovent  nasal, Singulair , Claritin   ENT ref Dr Thaddeus  CT sinuses     Skin ulcer of nose 07/13/2023   Soft pads on the nose  Derm appt     Spondylolisthesis, cervical region 03/11/2021   Stool incontinence 11/16/2022   12/23  Worse.  Stool leakage - new  D/c Aricept   Abd X ray to rule out constipation     Tibialis tendinitis 03/20/2008   Torn meniscus    bilateral   Trochanteric bursitis of right hip 09/22/2020   Upper respiratory infection 08/03/2012   9/13 - 3 wks  2/17  11/18 - asthmatic bronchitis     Urinary urgency 08/19/2014   2023 worse.  Obtain urinalysis.  D/c Aricept   UA was ok  On Premarin cream PV per GYN  On Vesicare   F/u w/Dr JONELLE Nicholaus     Urticaria 02/22/2022      Objective:  Today's Vitals   09/26/24 1329  BP: 130/72  Pulse: 85  SpO2: 98%  Weight: 104 lb (47.2 kg)  Height: 5' (1.524 m)   Body mass index is 20.31 kg/m.   Physical Exam Vitals and nursing note reviewed. Exam conducted with a chaperone present.   Constitutional:      Appearance: Normal appearance. She is well-developed.  Pulmonary:     Effort: Pulmonary effort is normal.  Abdominal:     General: Abdomen is flat.     Palpations: Abdomen is soft.  Genitourinary:    General: Normal vulva.     Vagina: No vaginal discharge, erythema (moderately atrophic), bleeding or lesions.     Cervix: Normal. No discharge, friability, lesion or erythema.     Uterus: Normal.      Adnexa: Right adnexa normal and left adnexa normal.  Neurological:     Mental Status: She is alert.  Psychiatric:        Mood and Affect: Mood normal.        Thought Content: Thought content normal.        Judgment: Judgment normal.      Dereck Keas, CMA present for exam  Assessment:/Plan:  1. Vaginal discomfort (Primary) - estradiol  (ESTRACE ) 0.01 % CREA vaginal cream; Use 1 gram vaginally 3 times a week at bedtime  Dispense: 42.5 g; Refill: 12 - SureSwab Advanced Vaginitis, TMA  2. Nocturia - Urinalysis,Complete w/RFL Culture   Will contact patient with results of testing completed today. Avoid the use of soaps or perfumed products in the peri area. Avoid tub baths and sitting in sweaty or wet clothing for prolonged periods of time.     Maury Groninger B, NP 2:02 PM

## 2024-09-27 LAB — URINE CULTURE
MICRO NUMBER:: 17163690
SPECIMEN QUALITY:: ADEQUATE

## 2024-09-27 LAB — URINALYSIS, COMPLETE W/RFL CULTURE
Glucose, UA: NEGATIVE
Hgb urine dipstick: NEGATIVE
Hyaline Cast: NONE SEEN /LPF
Ketones, ur: NEGATIVE
Leukocyte Esterase: NEGATIVE
Nitrites, Initial: NEGATIVE
Protein, ur: NEGATIVE
RBC / HPF: NONE SEEN /HPF (ref 0–2)
Specific Gravity, Urine: 1.015 (ref 1.001–1.035)
pH: 6 (ref 5.0–8.0)

## 2024-09-27 LAB — CULTURE INDICATED

## 2024-09-27 LAB — SURESWAB® ADVANCED VAGINITIS,TMA
CANDIDA SPECIES: NOT DETECTED
Candida glabrata: NOT DETECTED
SURESWAB(R) ADV BACTERIAL VAGINOSIS(BV),TMA: NEGATIVE
TRICHOMONAS VAGINALIS (TV),TMA: NOT DETECTED

## 2024-09-27 NOTE — Telephone Encounter (Signed)
 Spoke with patient, advised sureswab and urine culture pending. Advised 48 hrs on the Sureswab and culture 72 hrs. Advised she will be called when results are back.   Patient denies any new symptoms or worsening symptoms. Patient aware to call if any changes.

## 2024-09-27 NOTE — Telephone Encounter (Signed)
 Patient left message on triage line requesting results from OV on 10/29, states she has reviewed on MyChart.

## 2024-09-27 NOTE — Telephone Encounter (Signed)
 Sureswab can take up to 48hrs

## 2024-09-28 ENCOUNTER — Ambulatory Visit: Payer: Self-pay | Admitting: Radiology

## 2024-10-01 NOTE — Telephone Encounter (Signed)
 Call returned to patient. Advised of results per Jami.   Patient states she has been using estradiol  vaginal cream, still feeling uncomfortable in the the vaginal area.   Patient has received RX for estradiol  from GYN and PCP, she needs clarification for use.   Reviewed current regimen with patient. States she is using 2 grams few times a week and applying externally during the day. States she has used a whole tube since RX filled on 09/05/24. Patient reports discomfort improves when using estradiol  at night. Patient also has previous Rx for Terconazole, which she states she has only used once.   Patient denies any other symptoms. I advised may be too early to fill estradiol , since less than 30 days since last Rx.   Advised patient to hold off on using estradiol  unutil I can review with Dr. Nikki. I will review plan of care with Dr. Nikki and f/u. Patient agreeable.

## 2024-10-02 NOTE — Telephone Encounter (Signed)
 Spoke with patient. She is having a lot of vaginal burning. She is scheduled for Ultrasound on Thursday 11/6 at 11am Patient also requested an office visit with Dr. Nikki for ASAP due to discomfort. Patient is concerned she may have a vaginitis. Soonest appointment is 11/5 at 2pm. Patient is seeking advise for what to do in the mean time for the discomfort until she can be seen. Please advise.

## 2024-10-02 NOTE — Progress Notes (Unsigned)
 GYNECOLOGY  VISIT   HPI: 88 y.o.   Married  Caucasian female   [phone] with No LMP recorded. Patient is postmenopausal.   here for: Vaginal discomfort and irritation, started few days ago. No itching, no discharge, no other symptoms.     Feels uncomfortable in the vagina.  Denies pain.   No itching.  No bleeding.    Seen 09/03/24 for vaginitis.  Dx with yeast.  Rx for Terazol 7.    Seen 10/29 for vaginal discomfort.   Negative vaginitis testing and negative urine culture.   She has been using vaginal estradiol  cream initially prescribed by provider outside this office.  She has been using it even daily to treat discomfort.   She takes Myrbetriq  for OAB.  Does not drive.    GYNECOLOGIC HISTORY: No LMP recorded. Patient is postmenopausal. Contraception:  PMP Menopausal hormone therapy:  Estrace   Last 2 paps:  08/07/21 neg, 07/03/20 neg  History of abnormal Pap or positive HPV:  no Mammogram:  04/16/24 Breast Density Cat B, BIRADS Cat 1 neg         OB History     Gravida  3   Para  2   Term  2   Preterm      AB  1   Living  2      SAB  1   IAB      Ectopic      Multiple      Live Births                 Patient Active Problem List   Diagnosis Date Noted   Chronic rhinitis 09/12/2024   Paronychia of left middle finger 05/22/2024   Spasm of right trapezius muscle 05/03/2024   Postnasal drip 04/30/2024   Mild cognitive impairment with memory loss [G31.84] 04/16/2024   Abrasion of skin 04/09/2024   Episode of confusion 04/02/2024   Allergic rhinitis 01/24/2024   Wheezing 01/24/2024   Statin intolerance 01/09/2024   Chronic low back pain 11/28/2023   Acute pain of right shoulder 11/28/2023   Dyslipidemia 11/28/2023   Alzheimer's dementia (HCC) 09/15/2023   CHF (congestive heart failure)    Hypotension 07/13/2023   Skin ulcer of nose 07/13/2023   Chalazion left upper eyelid 04/18/2023   Bruising 04/06/2023   Pain of right sacroiliac joint  01/14/2023   Lumbar degenerative disc disease 12/21/2022   Chronic left SI joint pain 11/30/2022   Inflammation of sacroiliac joint 11/16/2022   Stool incontinence 11/16/2022   Grief 10/06/2022   History of basal cell carcinoma 09/29/2022   Stress at home 07/21/2022   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   Gait disorder 02/28/2022   Melanocytic nevi of trunk 02/22/2022   Seborrheic dermatitis 02/22/2022   Urticaria 02/22/2022   Concussion 10/29/2021   Allergic rhinitis due to animal (cat) (dog) hair and dander 08/07/2021   Allergic rhinitis due to pollen 08/07/2021   Mild intermittent asthma 08/07/2021   Cervical spine instability 05/15/2021   Cervical spinal cord compression 05/04/2021   Degenerative disc disease, cervical 03/11/2021   Spondylolisthesis, cervical region 03/11/2021   Trochanteric bursitis of right hip 09/22/2020   Meningioma 03/25/2020   Cholelithiasis 09/09/2019   Cervical spondylosis 01/05/2019   Neck pain 12/26/2018   Generalized anxiety disorder 08/22/2018   Contact dermatitis and eczema 06/14/2018   Constipation 11/11/2016   Sinusitis, chronic 08/06/2016   Rash 04/20/2016   Aortic valve sclerosis 03/12/2015   Fatigue 09/08/2014   Increased frequency  of urination 08/19/2014   Urinary urgency 08/19/2014   Postherpetic neuralgia 10/02/2013   Cerumen impaction 07/16/2013   Insomnia 01/24/2013   Upper respiratory infection 08/03/2012   Pruritus of skin 03/11/2012   MVP (mitral valve prolapse)    Multiple thyroid  nodules 10/04/2011   Herpes zoster 09/07/2011   Essential hypertension, benign 07/27/2011   Vaginitis    Hyperlipemia, mixed 03/15/2011   OAB (overactive bladder) 09/02/2010   Hypothyroidism 03/03/2010   Irritable bowel syndrome 09/04/2008   History of colonic polyps 09/04/2008   Atrophic gastritis 09/03/2008   Duodenitis 09/03/2008   Tibialis tendinitis 03/20/2008   Osteoarthritis 02/25/2008   Lactose intolerance 11/20/2007   GERD  (gastroesophageal reflux disease) 11/20/2007   Osteoporosis 11/20/2007    Past Medical History:  Diagnosis Date   Allergic rhinitis due to animal (cat) (dog) hair and dander 08/07/2021   Allergic rhinitis due to pollen 08/07/2021   Aortic valve sclerosis 03/12/2015   Atrophic gastritis 09/03/2008   Atrophic vaginitis    Baker's cyst    Left-Dr. Melodi   Bladder pain 08/19/2014   Chronic  Take Vesicare  10 mg/d  UA was ok  On Premarin cream PV per GYN  On Vesicare   F/u w/Dr JONELLE Moats     Cataract    Dr. Octavia   Cervical spinal cord compression 05/04/2021   Cervical spine instability 05/15/2021   Dr. Unice: plan -  posterior C1-2 decompression and instrumented fusion.  MRI: Progressive soft tissue pannus at C1-2 is now creating mass  effect on the craniocervical junction with distortion of the upper  spinal cord. This is likely related rheumatoid arthritis  C spine CT: Fracture of the anterior arch of C1 with 12 mm separation. Nondisplaced fracture posterior arch of C1. There is later   Cervical spondylosis 01/05/2019   Chalazion left upper eyelid 04/18/2023   X2  Keflex  po - too big... Will use Ceftin   F/u w/Ophthalmology   CHF (congestive heart failure)    Cholelithiasis 09/09/2019   Per Dr Abran: At this point we mutually decided to watch her abdominal complaints.  If symptoms accelerate or become more classic for symptomatic cholelithiasis, then she is agreeable to surgical referral.  She will keep me posted   Chronic left SI joint pain 11/30/2022   Closed fracture of first cervical vertebra 08/07/2021   Closed fracture of second cervical vertebra 08/07/2021   Concussion 10/29/2021   s/p a hard fall after she rushed and slipped on the wet marble floor while visiting family in [place]  a couple week ago.  She hit the side of her skull, had a skin laceration bled quite a bit.  She was dazed and according to her son [name] had a loss of consciousness of under a minute duration.  She felt  dazed.  She was taken to ER.  2 staples were applied to her skin laceration on the left o   Constipation 11/11/2016   Chronic, multifactorial  Miralax  prn  7/22 Postop constipation related to oxycodone  and Robaxin .  We can discontinue oxycodone  Robaxin .  The patient will use tramadol  and Tylenol  instead.  Use MiraLAX  or Senokot as to produce soft regular stools.  Discontinue Pepto-Bismol.  I think this should help with abdominal bloating complaint.      Take Metamucil daily  Senokot as to produce soft regular sto   Contact dermatitis and eczema 06/14/2018   Eczema vs other - 7/19  Cortaid prn  Depo-medrol  IM 80 mg  Remove nail polish  Contusion of left chest wall 01/09/2015   Maisey fell and broke a rib #8 on the L last week, she had a rib X ray and a CT (Dr Jane).   Contusion of right knee 08/12/2016   R knee   Degenerative disc disease, cervical 03/11/2021   Duodenitis 09/03/2008   Essential hypertension, benign 07/27/2011   Chronic, mild  NAS diet  Cardiac CT scan for calcium  scoring offered 1/20  1/24 HTN and s/p ER visit on 12/08/22 for SBP 180. Per Dr Randol:  Discussed with the patient that her restarting donepezil  could have impacted her symptoms today.  However, her blood pressure continues to be mildly elevated.  Will place her on 2.5 mg amlodipine  to take if her blood pressure is over 170/100.  Nursing aide at   Fatigue 09/08/2014   10/15, 2/16, 9/17, 2/18  No other sx  Brie is managing it with short rest periods 1-2/d     Fibroid    Gait disorder 02/28/2022   Worse  Multifactorial  Cont w/PT  In the balance class  Balance class q 1 week     [name] resolved after doing exercises by Dr Harvey 11/2021     Generalized anxiety disorder 08/22/2018   Chronic   Lexapro  - not taking  Weighted blanket  Lorazepam  prn, - tolerance has developed   Potential benefits of a long term benzodiazepines  use as well as potential risks  and complications were explained to the patient and were aknowledged.      GERD (gastroesophageal reflux disease) 11/20/2007   Chronic. Better on Gluten free diet  Dexilant    Potential benefits of a long term PPI  use as well as potential risks  and complications were explained to the patient and were aknowledged.  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     History of basal cell carcinoma 09/29/2022   History of colonic polyps 09/04/2008   Hyperlipemia, mixed 03/15/2011   Chronic  On Simvastatin      Hypotension 07/13/2023   Stop Amlodipine      Hypothyroidism    Impacted cerumen of right ear 07/16/2013   Increased frequency of urination 08/19/2014   Inflammation of sacroiliac joint 11/16/2022   Insomnia 01/24/2013   Lorazepam  prn   Potential benefits of a long term benzodiazepines  use as well as potential risks  and complications were explained to the patient and were aknowledged.        Irritable bowel syndrome 09/04/2008   Chronic   Abran MD, Norleen SAILOR  2013 - much better on gluten free diet              Lactose intolerance 11/20/2007   Lactaid        Lumbar degenerative disc disease 12/21/2022   Based on MRI and her exam there is significant DDD in lumbar spine     Lumbar spondylosis    Melanocytic nevi of trunk 02/22/2022   Meningioma 03/25/2020   Dr Unice     Mild cognitive impairment with memory loss 09/15/2023   Mild intermittent asthma 08/07/2021   Multiple thyroid  nodules 10/04/2011   2013 she was started on Synthroid  - tol well  2017, 2022 US  OK  MVP (mitral valve prolapse)    Antibiotics required for dental procedures   Neck pain 12/26/2018   Chronic  PT, traction, TENs unit was offered  Tramadol  prn   Potential benefits of a long term opioids use as well as potential risks (i.e. addiction risk, apnea etc) and complications (i.e. Somnolence, constipation  and others) were explained to the patient and were aknowledged.        OAB (overactive bladder) 09/02/2010   Dr Nicholaus  Use Solifenacin  prn  Worse - ?multifactorial  UA was normal  Camie increased Memantine  to 10 mg bid a month ago     Osteoarthritis 02/25/2008   Dr Bonner  Blue-Emu cream was recommended to use 2-3 times a day        Osteoporosis 11/20/2007   Chronic  Per GYN        Overactive bladder    Pain of right sacroiliac joint 01/14/2023   Postherpetic neuralgia 10/02/2013   Pruritus of skin 03/11/2012   S/p H zoster vaccination  Relapsing  8/12 - on face  8/14 - R ear/face  10/14 ?     Sacroiliitis    Seborrheic dermatitis 02/22/2022   Sinusitis, chronic 08/06/2016   Worse  Flonase , Atrovent  nasal, Singulair , Claritin   ENT ref Dr Thaddeus  CT sinuses     Skin ulcer of nose 07/13/2023   Soft pads on the nose  Derm appt     Spondylolisthesis, cervical region 03/11/2021   Stool incontinence 11/16/2022   12/23  Worse.  Stool leakage - new  D/c Aricept   Abd X ray to rule out constipation     Tibialis tendinitis 03/20/2008   Torn meniscus    bilateral   Trochanteric bursitis of right hip 09/22/2020   Upper respiratory infection 08/03/2012   9/13 - 3 wks  2/17  11/18 - asthmatic bronchitis     Urinary urgency 08/19/2014   2023 worse.  Obtain urinalysis.  D/c Aricept   UA was ok  On Premarin cream PV per GYN  On Vesicare   F/u w/Dr JONELLE Nicholaus     Urticaria 02/22/2022    Past Surgical History:  Procedure Laterality Date   CATARACT EXTRACTION, BILATERAL     EYE SURGERY     POSTERIOR CERVICAL FUSION/FORAMINOTOMY N/A 05/15/2021   Procedure: Cervical One Laminectomy with resection of cyst, Fixation from Occiput to Cervical Four;  Surgeon: Unice Pac, MD;  Location: [facility] OR;  Service: Neurosurgery;  Laterality: N/A;   TONSILLECTOMY      Current Outpatient Medications  Medication Sig Dispense Refill   acetaminophen  (TYLENOL ) 500 MG tablet Take 1,000 mg by mouth every 6 (six) hours as needed  for mild pain.     albuterol  (VENTOLIN  HFA) 108 (90 Base) MCG/ACT inhaler Take 2 puffs every 4-6 hours as needed     amLODipine  (NORVASC ) 2.5 MG tablet Take 2.5 mg by mouth as needed.     Ascorbic Acid (VITAMIN C ) 100 MG tablet Take 1 tablet (100 mg total) by mouth daily. 100 tablet 1   azelastine  (ASTELIN ) 0.1 % nasal spray 1 spray in each nostril Nasally Once a day in the evening     Calcium  Carbonate-Vitamin D  (CALCIUM  CARBONATE W/VITAMIN D  PO) Take 1 tablet by mouth daily.     cyanocobalamin  (VITAMIN B12) 1000 MCG tablet Take 1 tablet (1,000 mcg total) by mouth daily. 100 tablet 3   EPINEPHrine  (EPIPEN  2-PAK) 0.3 mg/0.3 mL IJ SOAJ injection Inject 0.3 mLs (0.3 mg total) into the muscle as needed.  1 Device 1   erythromycin  ophthalmic ointment Place 1 Application into both eyes as needed.     estradiol  (ESTRACE ) 0.01 % CREA vaginal cream Use 1 gram vaginally 3 times a week at bedtime 42.5 g 12   famotidine  (PEPCID ) 40 MG tablet Take 40 mg by mouth as needed for heartburn or indigestion.     fluticasone  (FLONASE ) 50 MCG/ACT nasal spray Place 2 sprays into both nostrils daily. 16 g 6   guaiFENesin -dextromethorphan (ROBITUSSIN DM) 100-10 MG/5ML syrup Take 5-10 mLs by mouth every 4 (four) hours as needed for cough (chest congestion). 240 mL 0   ipratropium (ATROVENT ) 0.06 % nasal spray Place 2 sprays into both nostrils 2 (two) times daily as needed for rhinitis.     Lactase (LACTAID PO) Take by mouth.     levocetirizine (XYZAL ) 5 MG tablet Take 1 tablet (5 mg total) by mouth daily. 90 tablet 3   levothyroxine  (SYNTHROID ) 50 MCG tablet TAKE ONE TABLET BY MOUTH DAILY BEFORE BREAKFAST 30 tablet 5   LORazepam  (ATIVAN ) 1 MG tablet Take 2-3 tablets at bedtime and 1-2 tablets in the daytime as needed. 150 tablet 2   memantine  (NAMENDA  XR) 14 MG CP24 24 hr capsule Take 1 capsule (14 mg total) by mouth at bedtime. 90 capsule 11   mirabegron  ER (MYRBETRIQ ) 25 MG TB24 tablet Take 1 tablet (25 mg total) by  mouth daily. 90 tablet 0   polyethylene glycol powder (GLYCOLAX /MIRALAX ) 17 GM/SCOOP powder Take 17 g by mouth 2 (two) times daily as needed for moderate constipation. 500 g 3   UNABLE TO FIND Med Name: CBD oil - under tongue     desonide  (DESOWEN ) 0.05 % cream Apply 1 application topically as needed. (Patient not taking: Reported on 10/03/2024)     hydrocortisone  (ANUSOL -HC) 2.5 % rectal cream Place 1 Application rectally as needed for hemorrhoids or anal itching. (Patient not taking: Reported on 10/03/2024)     Magnesium  Sulfate (EPSOM SALT) POWD UAD for warm water soaks (Patient not taking: Reported on 10/03/2024) 500 g 0   MegaRed Omega-3 Krill Oil 500 MG CAPS Take 1 capsule by mouth every morning. (Patient not taking: Reported on 10/03/2024) 100 capsule 3   pantoprazole  (PROTONIX ) 40 MG tablet TAKE ONE TABLET BY MOUTH TWICE DAILY BEFORE MEALS (Patient not taking: Reported on 10/03/2024) 60 tablet 5   psyllium (METAMUCIL) 58.6 % packet Take 1 packet by mouth as needed. (Patient not taking: Reported on 10/03/2024)     triamcinolone  ointment (KENALOG ) 0.1 % Apply 1 Application topically 3 (three) times daily. As needed for itching (Patient not taking: Reported on 10/03/2024) 80 g 2   No current facility-administered medications for this visit.     ALLERGIES: Bee venom, Bacitracin -polymyxin b, Clarithromycin, Diclofenac , Neosporin [neomycin -bacitracin  zn-polymyx], and Hydrocortisone   Family History  Problem Relation Age of Onset   Heart disease Mother    Hypertension Mother    Osteoporosis Mother    Congestive Heart Failure Mother    Pancreatic cancer Father    Heart attack Brother    Drug abuse Brother        over dose    Healthy Son    Healthy Son    Colon cancer Neg Hx    Colon polyps Neg Hx    Rectal cancer Neg Hx    Stomach cancer Neg Hx    Breast cancer Neg Hx     Social History   Socioeconomic History   Marital status: Married    Spouse  name: Not on file   Number of children:  2   Years of education: 15   Highest education level: Some college, no degree  Occupational History   Occupation: retired    Associate Professor: RETIRED  Tobacco Use   Smoking status: Former    Current packs/day: 0.00    Types: Cigarettes    Start date: 1963    Quit date: 1973    Years since quitting: 52.8    Passive exposure: Never   Smokeless tobacco: Never   Tobacco comments:    Former smoker 06/25/22  Vaping Use   Vaping status: Never Used  Substance and Sexual Activity   Alcohol use: Yes    Alcohol/week: 1.0 standard drink of alcohol    Types: 1 Glasses of wine per week    Comment: Socially   Drug use: No   Sexual activity: Not Currently    Birth control/protection: Post-menopausal, None    Comment: 1st intercourse 88 yo-Fewer than 5 partners  Other Topics Concern   Not on file  Social History Narrative   Regular exercise-yesDaily caffeine use   Right handed   Lives alone   retired   Social Drivers of Corporate Investment Banker Strain: Low Risk  (05/07/2024)   Overall Financial Resource Strain (CARDIA)    Difficulty of Paying Living Expenses: Not hard at all  Food Insecurity: Low Risk  (06/04/2024)   Received from [facility]   Hunger Vital Sign    Within the past 12 months, you worried that your food would run out before you got money to buy more: Never true    Within the past 12 months, the food you bought just didn't last and you didn't have money to get more. : Never true  Transportation Needs: No Transportation Needs (06/04/2024)   Received from [facility]    In the past 12 months, has lack of reliable transportation kept you from medical appointments, meetings, work or from getting things needed for daily living? : No  Physical Activity: Sufficiently Active (05/07/2024)   Exercise Vital Sign    Days of Exercise per Week: 5 days    Minutes of Exercise per Session: 30 min  Stress: No Stress Concern Present (05/07/2024)   [employer] of  Occupational Health - Occupational Stress Questionnaire    Feeling of Stress : Not at all  Social Connections: Moderately Integrated (05/07/2024)   Social Connection and Isolation Panel    Frequency of Communication with Friends and Family: Twice a week    Frequency of Social Gatherings with Friends and Family: Twice a week    Attends Religious Services: More than 4 times per year    Active Member of [employer] or Organizations: Yes    Attends Banker Meetings: More than 4 times per year    Marital Status: Widowed  Intimate Partner Violence: Not At Risk (05/07/2024)   Humiliation, Afraid, Rape, and Kick questionnaire    Fear of Current or Ex-Partner: No    Emotionally Abused: No    Physically Abused: No    Sexually Abused: No    Review of Systems  All other systems reviewed and are negative.   PHYSICAL EXAMINATION:   BP 122/76 (BP Location: Left Arm, Patient Position: Sitting)   Pulse 83   SpO2 96%     General appearance: alert, cooperative and appears stated age   Pelvic: External genitalia:  no lesions  Urethra:  normal appearing urethra with no masses, tenderness or lesions              Bartholins and Skenes: normal                 Vagina: normal appearing vagina with normal color and discharge, no lesions              Cervix: no lesions.  External os is open.                 Bimanual Exam:  Uterus:  normal size, contour, position, consistency, mobility, non-tender              Adnexa: no mass, fullness, tenderness               Chaperone was present for exam:  Kari HERO, CMA  Pelvic US :  Uterus 7.10 cm x 4.58 cm x 4.42 cm.  Fibroids - intramural, subserosal:   2.09 cm, 1.66 cm.  EMS 4.40 mm with fluid in the endometrial canal.  Avascular. Left ovary 1.39 x 0.79 x 0.81 cm.  Atrophic.  Right ovary 1.84 x 0.97 x 0.72 cm.   Atrophic.  No adnexal masses.  No free fluid.   ASSESSMENT:  Endometrial thickening.  Fluid in the endometrial canal.   Fibroids.  PLAN:  US  images and report reviewed.  Reduce use of vaginal estradiol  cream to 1 gram vaginally twice a week.  Aquaphor prn.  Tylenol  2 tabs every 6 hours prn discomfort.  Return for endometrial biopsy.  Procedure and rationale explained.   She will have a driver with her the day of her procedure.   30 min  total time was spent for this patient encounter, including preparation, face-to-face counseling with the patient, coordination of care, and documentation of the encounter.

## 2024-10-03 ENCOUNTER — Other Ambulatory Visit: Payer: Self-pay | Admitting: Obstetrics and Gynecology

## 2024-10-03 ENCOUNTER — Ambulatory Visit (INDEPENDENT_AMBULATORY_CARE_PROVIDER_SITE_OTHER): Admitting: Obstetrics and Gynecology

## 2024-10-03 ENCOUNTER — Encounter: Payer: Self-pay | Admitting: Obstetrics and Gynecology

## 2024-10-03 ENCOUNTER — Ambulatory Visit

## 2024-10-03 VITALS — BP 122/76 | HR 83

## 2024-10-03 DIAGNOSIS — N949 Unspecified condition associated with female genital organs and menstrual cycle: Secondary | ICD-10-CM | POA: Diagnosis not present

## 2024-10-03 DIAGNOSIS — D219 Benign neoplasm of connective and other soft tissue, unspecified: Secondary | ICD-10-CM

## 2024-10-03 DIAGNOSIS — N859 Noninflammatory disorder of uterus, unspecified: Secondary | ICD-10-CM

## 2024-10-03 DIAGNOSIS — R9389 Abnormal findings on diagnostic imaging of other specified body structures: Secondary | ICD-10-CM

## 2024-10-03 NOTE — Patient Instructions (Addendum)
 You may use your vaginal estradiol  cream 1 gram in the vagina at bedtime twice a week.   Endometrial Biopsy  An endometrial biopsy is a procedure where a tissue sample is removed from the lining of the uterus. This lining is called the endometrium. The tissue sample is then sent to a lab for testing. You may have this type of biopsy to check for: Cancer. Infection. Growths called polyps. Uterine bleeding that can't be explained. Tell a health care provider about: Any allergies you have. All medicines you're taking including vitamins, herbs, eye drops, creams, and over-the-counter medicines. Any problems you or family members have had with anesthesia. Any bleeding problems you have. Any surgeries you have had. Any medical problems you have. Whether you're pregnant or may be pregnant. What are the risks? Your health care provider will talk with you about risks. These may include: Bleeding. Infection. Allergic reactions to medicines. Damage to the wall of the uterus. This is rare. What happens before the procedure? Keep track of your period. You may need to have this biopsy when you're not having your period. Ask your provider about: Changing or stopping your regular medicines. These include any diabetes medicines or blood thinners you take. Taking medicines such as aspirin  and ibuprofen. These medicines can thin your blood. Do not take them unless your provider tells you to. Taking over-the-counter medicines, vitamins, herbs, and supplements. Bring a pad with you. You may need to wear one after the biopsy. Plan to have a responsible adult take you home from the hospital or clinic. You won't be allowed to drive. What happens during the procedure? A tool will be put into your vagina to hold it open. This helps your provider see the cervix. The cervix is the lowest part of the uterus. Your cervix will be cleaned with a solution that kills germs. You will be given anesthesia. This keeps  you from feeling pain. It will numb your cervix. A tool called forceps will be used to hold your cervix steady. A thin tool called a uterine sound will be put through your cervix. It will be used to: Find the length of your uterus. Find where to take the sample from. A soft tube called a catheter will be put into your uterus. The catheter will remove a tissue sample. The tube and tools will be removed. The sample will be sent to a lab for testing. The procedure may vary among providers and hospitals. What happens after the procedure? Your blood pressure, heart rate, breathing rate, and blood oxygen level will be monitored until you leave the hospital or clinic. It's up to you to get the results of your procedure. Ask your provider, or the department that is doing the procedure, when your results will be ready. This information is not intended to replace advice given to you by your health care provider. Make sure you discuss any questions you have with your health care provider. Document Revised: 01/25/2023 Document Reviewed: 01/25/2023 Elsevier Patient Education  2024 [employer].

## 2024-10-04 ENCOUNTER — Other Ambulatory Visit

## 2024-10-04 ENCOUNTER — Telehealth: Payer: Self-pay

## 2024-10-04 NOTE — Telephone Encounter (Signed)
 Patient left message to confirm that she is to use Aquaphor prn.  Tried to return patients call  to advise her that per her visit note from 10/03/24 that is what Dr. Nikki had recommended, but mail box is full and unable to leave message.

## 2024-10-08 ENCOUNTER — Other Ambulatory Visit: Payer: Self-pay | Admitting: Internal Medicine

## 2024-10-08 NOTE — Telephone Encounter (Signed)
 Patient left message about vaginal discomfort. She asked for directions about Aquaphor prn use and how often she could use estrogen. Explained to her that she could use the Aquaphor as she needed it on the outside of the vaginal area but do not put inside of vagina and that she is to only use 1g estrogen cream 2x a week inside the vagina. Patient repeated instructs back to me and voiced understanding.

## 2024-10-08 NOTE — Telephone Encounter (Unsigned)
 Copied from CRM [id number]. Topic: Clinical - Medication Refill >> Oct 08, 2024  8:55 AM Thersia C wrote: Medication: fluticasone  (FLONASE ) 50 MCG/ACT nasal spray  Has the patient contacted their pharmacy? Yes (Agent: If no, request that the patient contact the pharmacy for the refill. If patient does not wish to contact the pharmacy document the reason why and proceed with request.) (Agent: If yes, when and what did the pharmacy advise?)  This is the patient's preferred pharmacy:  [facility] [place], [place] - [address] [place] [address] Phone: [phone] Fax: [phone]   Is this the correct pharmacy for this prescription? Yes If no, delete pharmacy and type the correct one.   Has the prescription been filled recently? No  Is the patient out of the medication? Yes  Has the patient been seen for an appointment in the last year OR does the patient have an upcoming appointment? Yes  Can we respond through MyChart? Yes  Agent: Please be advised that Rx refills may take up to 3 business days. We ask that you follow-up with your pharmacy.

## 2024-10-09 MED ORDER — FLUTICASONE PROPIONATE 50 MCG/ACT NA SUSP: 2.0000 | Freq: Every day | NASAL | 6 refills | Status: AC

## 2024-10-09 NOTE — Telephone Encounter (Signed)
 Patient left message on triage line stating that she is having a problem with the vaginal discomfort still. She requested call back from Dr. Nikki or a nurse.

## 2024-10-09 NOTE — Telephone Encounter (Signed)
 Multiple calls received from patient over past few weeks, patient asking the same questions and reporting same concerns.   Spoke with patient. A/O x4. I asked patient if she has anyone that lives in the home with her or caregiver that we could discuss plan of care with? Patient states Maryann is with her during the day until 3pm, her children live in [place] . Patient states Stacy communicates with her children. Patient advised ok to review plan of care with Maryann.   Patient placed call on speaker phone so plan of care could be reviewed. Patient reports continued vulvar discomfort, denies any new symptoms.   Reviewed Dr. Cyrilla recommendations per OV 10/03/24. Patient last used estradiol  vaginal cream on 11/10, instructed ti not use again until 10/11/24. Instructed on Aquaphor use. Maryann read back instructions.   Patient currently scheduled for EMB on 12/19, this has been rescheduled to 10/10/24 at 1400 per patients request. Maryann will accompany patient to this appointment. Questions answered. Advised I will provide an update to Dr. Nikki and our office will f/u if any additional questions. Patient and Maryann verbalized understanding.    Dr. Nikki- please review and let me know if any additional assistance is needed following visit on 10/10/24.   Cc: [facility]

## 2024-10-09 NOTE — Telephone Encounter (Signed)
 Patient will have vaginal bleeding and some discomfort following the endometrial biopsy.   I expect her to have vaginal bleeding for up to one week.  She can use a pad or a panty shield for this.   The cramping should resolve the same day.  She may take Tylenol  for this.

## 2024-10-10 ENCOUNTER — Encounter: Payer: Self-pay | Admitting: Obstetrics and Gynecology

## 2024-10-10 ENCOUNTER — Ambulatory Visit: Admitting: Obstetrics and Gynecology

## 2024-10-10 ENCOUNTER — Other Ambulatory Visit (HOSPITAL_COMMUNITY)
Admission: RE | Admit: 2024-10-10 | Discharge: 2024-10-10 | Disposition: A | Discharge status: Home / Self Care | Source: Ambulatory Visit | Attending: Obstetrics and Gynecology | Admitting: Obstetrics and Gynecology

## 2024-10-10 VITALS — BP 114/62 | HR 88

## 2024-10-10 DIAGNOSIS — N859 Noninflammatory disorder of uterus, unspecified: Secondary | ICD-10-CM

## 2024-10-10 DIAGNOSIS — R9389 Abnormal findings on diagnostic imaging of other specified body structures: Secondary | ICD-10-CM | POA: Insufficient documentation

## 2024-10-10 NOTE — Patient Instructions (Signed)
 Endometrial Biopsy  An endometrial biopsy is a procedure where a tissue sample is removed from the lining of the uterus. This lining is called the endometrium. The tissue sample is then sent to a lab for testing. You may have this type of biopsy to check for: Cancer. Infection. Growths called polyps. Uterine bleeding that can't be explained. Tell a health care provider about: Any allergies you have. All medicines you're taking including vitamins, herbs, eye drops, creams, and over-the-counter medicines. Any problems you or family members have had with anesthesia. Any bleeding problems you have. Any surgeries you have had. Any medical problems you have. Whether you're pregnant or may be pregnant. What are the risks? Your health care provider will talk with you about risks. These may include: Bleeding. Infection. Allergic reactions to medicines. Damage to the wall of the uterus. This is rare. What happens before the procedure? Keep track of your period. You may need to have this biopsy when you're not having your period. Ask your provider about: Changing or stopping your regular medicines. These include any diabetes medicines or blood thinners you take. Taking medicines such as aspirin and ibuprofen. These medicines can thin your blood. Do not take them unless your provider tells you to. Taking over-the-counter medicines, vitamins, herbs, and supplements. Bring a pad with you. You may need to wear one after the biopsy. Plan to have a responsible adult take you home from the hospital or clinic. You won't be allowed to drive. What happens during the procedure? A tool will be put into your vagina to hold it open. This helps your provider see the cervix. The cervix is the lowest part of the uterus. Your cervix will be cleaned with a solution that kills germs. You will be given anesthesia. This keeps you from feeling pain. It will numb your cervix. A tool called forceps will be used to  hold your cervix steady. A thin tool called a uterine sound will be put through your cervix. It will be used to: Find the length of your uterus. Find where to take the sample from. A soft tube called a catheter will be put into your uterus. The catheter will remove a tissue sample. The tube and tools will be removed. The sample will be sent to a lab for testing. The procedure may vary among providers and hospitals. What happens after the procedure? Your blood pressure, heart rate, breathing rate, and blood oxygen level will be monitored until you leave the hospital or clinic. It's up to you to get the results of your procedure. Ask your provider, or the department that is doing the procedure, when your results will be ready. This information is not intended to replace advice given to you by your health care provider. Make sure you discuss any questions you have with your health care provider. Document Revised: 01/25/2023 Document Reviewed: 01/25/2023 Elsevier Patient Education  2024 [employer].

## 2024-10-10 NOTE — Telephone Encounter (Signed)
 Patient has the endometrial biopsy today. Dr Nikki can you review this with her today

## 2024-10-10 NOTE — Telephone Encounter (Signed)
 Yes

## 2024-10-10 NOTE — Progress Notes (Signed)
 GYNECOLOGY  VISIT   HPI: 88 y.o.   Married  Caucasian female   [phone] with No LMP recorded. Patient is postmenopausal.   here for: Endometrial biopsy for thickened endometrium, fluid in the endometrial canal and pelvic discomfort.     Caregiver, Orlene, is present today for the talking portion of the visit.   Patient resides at [facility].  GYNECOLOGIC HISTORY: No LMP recorded. Patient is postmenopausal. Contraception:  PMP Menopausal hormone therapy:  estrace   Last 2 paps:  08/07/21 neg, 07/03/20 neg  History of abnormal Pap or positive HPV:  no Mammogram:   04/16/24 Breast Density Cat B, BIRADS Cat 1 neg         OB History     Gravida  3   Para  2   Term  2   Preterm      AB  1   Living  2      SAB  1   IAB      Ectopic      Multiple      Live Births                 Patient Active Problem List   Diagnosis Date Noted   Chronic rhinitis 09/12/2024   Paronychia of left middle finger 05/22/2024   Spasm of right trapezius muscle 05/03/2024   Postnasal drip 04/30/2024   Mild cognitive impairment with memory loss [G31.84] 04/16/2024   Abrasion of skin 04/09/2024   Episode of confusion 04/02/2024   Allergic rhinitis 01/24/2024   Wheezing 01/24/2024   Statin intolerance 01/09/2024   Chronic low back pain 11/28/2023   Acute pain of right shoulder 11/28/2023   Dyslipidemia 11/28/2023   Alzheimer's dementia (HCC) 09/15/2023   CHF (congestive heart failure)    Hypotension 07/13/2023   Skin ulcer of nose 07/13/2023   Chalazion left upper eyelid 04/18/2023   Bruising 04/06/2023   Pain of right sacroiliac joint 01/14/2023   Lumbar degenerative disc disease 12/21/2022   Chronic left SI joint pain 11/30/2022   Inflammation of sacroiliac joint 11/16/2022   Stool incontinence 11/16/2022   Grief 10/06/2022   History of basal cell carcinoma 09/29/2022   Stress at home 07/21/2022   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   Gait disorder 02/28/2022    Melanocytic nevi of trunk 02/22/2022   Seborrheic dermatitis 02/22/2022   Urticaria 02/22/2022   Concussion 10/29/2021   Allergic rhinitis due to animal (cat) (dog) hair and dander 08/07/2021   Allergic rhinitis due to pollen 08/07/2021   Mild intermittent asthma 08/07/2021   Cervical spine instability 05/15/2021   Cervical spinal cord compression 05/04/2021   Degenerative disc disease, cervical 03/11/2021   Spondylolisthesis, cervical region 03/11/2021   Trochanteric bursitis of right hip 09/22/2020   Meningioma 03/25/2020   Cholelithiasis 09/09/2019   Cervical spondylosis 01/05/2019   Neck pain 12/26/2018   Generalized anxiety disorder 08/22/2018   Contact dermatitis and eczema 06/14/2018   Constipation 11/11/2016   Sinusitis, chronic 08/06/2016   Rash 04/20/2016   Aortic valve sclerosis 03/12/2015   Fatigue 09/08/2014   Increased frequency of urination 08/19/2014   Urinary urgency 08/19/2014   Postherpetic neuralgia 10/02/2013   Cerumen impaction 07/16/2013   Insomnia 01/24/2013   Upper respiratory infection 08/03/2012   Pruritus of skin 03/11/2012   MVP (mitral valve prolapse)    Multiple thyroid  nodules 10/04/2011   Herpes zoster 09/07/2011   Essential hypertension, benign 07/27/2011   Vaginitis    Hyperlipemia, mixed 03/15/2011   OAB (overactive  bladder) 09/02/2010   Hypothyroidism 03/03/2010   Irritable bowel syndrome 09/04/2008   History of colonic polyps 09/04/2008   Atrophic gastritis 09/03/2008   Duodenitis 09/03/2008   Tibialis tendinitis 03/20/2008   Osteoarthritis 02/25/2008   Lactose intolerance 11/20/2007   GERD (gastroesophageal reflux disease) 11/20/2007   Osteoporosis 11/20/2007    Past Medical History:  Diagnosis Date   Allergic rhinitis due to animal (cat) (dog) hair and dander 08/07/2021   Allergic rhinitis due to pollen 08/07/2021   Aortic valve sclerosis 03/12/2015   Atrophic gastritis 09/03/2008   Atrophic vaginitis    Baker's cyst     Left-Dr. Melodi   Bladder pain 08/19/2014   Chronic  Take Vesicare  10 mg/d  UA was ok  On Premarin cream PV per GYN  On Vesicare   F/u w/Dr JONELLE Moats     Cataract    Dr. Octavia   Cervical spinal cord compression 05/04/2021   Cervical spine instability 05/15/2021   Dr. Unice: plan -  posterior C1-2 decompression and instrumented fusion.  MRI: Progressive soft tissue pannus at C1-2 is now creating mass  effect on the craniocervical junction with distortion of the upper  spinal cord. This is likely related rheumatoid arthritis  C spine CT: Fracture of the anterior arch of C1 with 12 mm separation. Nondisplaced fracture posterior arch of C1. There is later   Cervical spondylosis 01/05/2019   Chalazion left upper eyelid 04/18/2023   X2  Keflex  po - too big... Will use Ceftin   F/u w/Ophthalmology   CHF (congestive heart failure)    Cholelithiasis 09/09/2019   Per Dr Abran: At this point we mutually decided to watch her abdominal complaints.  If symptoms accelerate or become more classic for symptomatic cholelithiasis, then she is agreeable to surgical referral.  She will keep me posted   Chronic left SI joint pain 11/30/2022   Closed fracture of first cervical vertebra 08/07/2021   Closed fracture of second cervical vertebra 08/07/2021   Concussion 10/29/2021   s/p a hard fall after she rushed and slipped on the wet marble floor while visiting family in [place]  a couple week ago.  She hit the side of her skull, had a skin laceration bled quite a bit.  She was dazed and according to her son [name] had a loss of consciousness of under a minute duration.  She felt dazed.  She was taken to ER.  2 staples were applied to her skin laceration on the left o   Constipation 11/11/2016   Chronic, multifactorial  Miralax  prn  7/22 Postop constipation related to oxycodone  and Robaxin .  We can discontinue oxycodone  Robaxin .  The patient will use tramadol  and Tylenol  instead.  Use MiraLAX  or Senokot as to produce soft  regular stools.  Discontinue Pepto-Bismol.  I think this should help with abdominal bloating complaint.      Take Metamucil daily  Senokot as to produce soft regular sto   Contact dermatitis and eczema 06/14/2018   Eczema vs other - 7/19  Cortaid prn  Depo-medrol  IM 80 mg  Remove nail polish     Contusion of left chest wall 01/09/2015   Johnston fell and broke a rib #8 on the L last week, she had a rib X ray and a CT (Dr Jane).   Contusion of right knee 08/12/2016   R knee   Degenerative disc disease, cervical 03/11/2021   Duodenitis 09/03/2008   Essential hypertension, benign 07/27/2011   Chronic, mild  NAS diet  Cardiac CT  scan for calcium  scoring offered 1/20  1/24 HTN and s/p ER visit on 12/08/22 for SBP 180. Per Dr Randol:  Discussed with the patient that her restarting donepezil  could have impacted her symptoms today.  However, her blood pressure continues to be mildly elevated.  Will place her on 2.5 mg amlodipine  to take if her blood pressure is over 170/100.  Nursing aide at   Fatigue 09/08/2014   10/15, 2/16, 9/17, 2/18  No other sx  Zada is managing it with short rest periods 1-2/d     Fibroid    Gait disorder 02/28/2022   Worse  Multifactorial  Cont w/PT  In the balance class  Balance class q 1 week     [name] resolved after doing exercises by Dr Harvey 11/2021     Generalized anxiety disorder 08/22/2018   Chronic   Lexapro  - not taking  Weighted blanket  Lorazepam  prn, - tolerance has developed   Potential benefits of a long term benzodiazepines  use as well as potential risks  and complications were explained to the patient and were aknowledged.     GERD (gastroesophageal reflux disease) 11/20/2007   Chronic. Better on Gluten free diet  Dexilant    Potential benefits of a long term PPI  use as well as potential risks  and complications were explained to the patient and were aknowledged.  GERD wedge              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   Headache 07/10/2013   Heart murmur     Hemorrhoids    Herpes zoster 09/07/2011   S/p H zoster vaccination  Relapsing  8/12 - on face  8/14 - R ear/face  10/14 ?     History of basal cell carcinoma 09/29/2022   History of colonic polyps 09/04/2008   Hyperlipemia, mixed 03/15/2011   Chronic  On Simvastatin      Hypotension 07/13/2023   Stop Amlodipine      Hypothyroidism    Impacted cerumen of right ear 07/16/2013   Increased frequency of urination 08/19/2014   Inflammation of sacroiliac joint 11/16/2022   Insomnia 01/24/2013   Lorazepam  prn   Potential benefits of a long term benzodiazepines  use as well as potential risks  and complications were explained to the patient and were aknowledged.        Irritable bowel syndrome 09/04/2008   Chronic   Abran MD, Norleen SAILOR  2013 - much better on gluten free diet              Lactose intolerance 11/20/2007   Lactaid        Lumbar degenerative disc disease 12/21/2022   Based on MRI and her exam there is significant DDD in lumbar spine     Lumbar spondylosis    Melanocytic nevi of trunk 02/22/2022   Meningioma 03/25/2020   Dr Unice     Mild cognitive impairment with memory loss 09/15/2023   Mild intermittent asthma 08/07/2021   Multiple thyroid  nodules 10/04/2011   2013 she was started on Synthroid  - tol well  2017, 2022 US  OK     MVP (mitral valve prolapse)    Antibiotics required for dental procedures   Neck pain 12/26/2018   Chronic  PT, traction, TENs unit was offered  Tramadol  prn   Potential benefits of a long term opioids use as well as potential risks (i.e. addiction risk, apnea etc) and complications (i.e. Somnolence, constipation and others) were explained to the patient and were aknowledged.  OAB (overactive bladder) 09/02/2010   Dr Nicholaus  Use Solifenacin  prn  Worse - ?multifactorial  UA was normal  Camie increased Memantine  to 10 mg bid a month ago     Osteoarthritis 02/25/2008   Dr Bonner  Blue-Emu cream was recommended to use 2-3 times a day        Osteoporosis  11/20/2007   Chronic  Per GYN        Overactive bladder    Pain of right sacroiliac joint 01/14/2023   Postherpetic neuralgia 10/02/2013   Pruritus of skin 03/11/2012   S/p H zoster vaccination  Relapsing  8/12 - on face  8/14 - R ear/face  10/14 ?     Sacroiliitis    Seborrheic dermatitis 02/22/2022   Sinusitis, chronic 08/06/2016   Worse  Flonase , Atrovent  nasal, Singulair , Claritin   ENT ref Dr Thaddeus  CT sinuses     Skin ulcer of nose 07/13/2023   Soft pads on the nose  Derm appt     Spondylolisthesis, cervical region 03/11/2021   Stool incontinence 11/16/2022   12/23  Worse.  Stool leakage - new  D/c Aricept   Abd X ray to rule out constipation     Tibialis tendinitis 03/20/2008   Torn meniscus    bilateral   Trochanteric bursitis of right hip 09/22/2020   Upper respiratory infection 08/03/2012   9/13 - 3 wks  2/17  11/18 - asthmatic bronchitis     Urinary urgency 08/19/2014   2023 worse.  Obtain urinalysis.  D/c Aricept   UA was ok  On Premarin cream PV per GYN  On Vesicare   F/u w/Dr JONELLE Nicholaus     Urticaria 02/22/2022    Past Surgical History:  Procedure Laterality Date   CATARACT EXTRACTION, BILATERAL     EYE SURGERY     POSTERIOR CERVICAL FUSION/FORAMINOTOMY N/A 05/15/2021   Procedure: Cervical One Laminectomy with resection of cyst, Fixation from Occiput to Cervical Four;  Surgeon: Unice Pac, MD;  Location: [facility];  Service: Neurosurgery;  Laterality: N/A;   TONSILLECTOMY      Current Outpatient Medications  Medication Sig Dispense Refill   acetaminophen  (TYLENOL ) 500 MG tablet Take 1,000 mg by mouth every 6 (six) hours as needed for mild pain.     albuterol  (VENTOLIN  HFA) 108 (90 Base) MCG/ACT inhaler Take 2 puffs every 4-6 hours as needed     amLODipine  (NORVASC ) 2.5 MG tablet Take 2.5 mg by mouth as needed.     Ascorbic Acid (VITAMIN C ) 100 MG tablet Take 1 tablet (100 mg total) by mouth daily. 100 tablet 1   azelastine  (ASTELIN ) 0.1 % nasal spray 1 spray in each  nostril Nasally Once a day in the evening     Calcium  Carbonate-Vitamin D  (CALCIUM  CARBONATE W/VITAMIN D  PO) Take 1 tablet by mouth daily.     cyanocobalamin  (VITAMIN B12) 1000 MCG tablet Take 1 tablet (1,000 mcg total) by mouth daily. 100 tablet 3   EPINEPHrine  (EPIPEN  2-PAK) 0.3 mg/0.3 mL IJ SOAJ injection Inject 0.3 mLs (0.3 mg total) into the muscle as needed. 1 Device 1   erythromycin  ophthalmic ointment Place 1 Application into both eyes as needed.     estradiol  (ESTRACE ) 0.01 % CREA vaginal cream Use 1 gram vaginally 3 times a week at bedtime 42.5 g 12   famotidine  (PEPCID ) 40 MG tablet Take 40 mg by mouth as needed for heartburn or indigestion.     fluticasone  (FLONASE ) 50 MCG/ACT nasal spray Place 2 sprays into  both nostrils daily. 16 g 6   guaiFENesin -dextromethorphan (ROBITUSSIN DM) 100-10 MG/5ML syrup Take 5-10 mLs by mouth every 4 (four) hours as needed for cough (chest congestion). 240 mL 0   ipratropium (ATROVENT ) 0.06 % nasal spray Place 2 sprays into both nostrils 2 (two) times daily as needed for rhinitis.     Lactase (LACTAID PO) Take by mouth.     levocetirizine (XYZAL ) 5 MG tablet Take 1 tablet (5 mg total) by mouth daily. 90 tablet 3   levothyroxine  (SYNTHROID ) 50 MCG tablet TAKE ONE TABLET BY MOUTH DAILY BEFORE BREAKFAST 30 tablet 5   LORazepam  (ATIVAN ) 1 MG tablet Take 2-3 tablets at bedtime and 1-2 tablets in the daytime as needed. 150 tablet 2   memantine  (NAMENDA  XR) 14 MG CP24 24 hr capsule Take 1 capsule (14 mg total) by mouth at bedtime. 90 capsule 11   mirabegron  ER (MYRBETRIQ ) 25 MG TB24 tablet Take 1 tablet (25 mg total) by mouth daily. 90 tablet 0   polyethylene glycol powder (GLYCOLAX /MIRALAX ) 17 GM/SCOOP powder Take 17 g by mouth 2 (two) times daily as needed for moderate constipation. 500 g 3   UNABLE TO FIND Med Name: CBD oil - under tongue     desonide  (DESOWEN ) 0.05 % cream Apply 1 application topically as needed. (Patient not taking: Reported on 10/10/2024)      hydrocortisone  (ANUSOL -HC) 2.5 % rectal cream Place 1 Application rectally as needed for hemorrhoids or anal itching. (Patient not taking: Reported on 10/10/2024)     Magnesium  Sulfate (EPSOM SALT) POWD UAD for warm water soaks (Patient not taking: Reported on 10/10/2024) 500 g 0   MegaRed Omega-3 Krill Oil 500 MG CAPS Take 1 capsule by mouth every morning. (Patient not taking: Reported on 10/10/2024) 100 capsule 3   pantoprazole  (PROTONIX ) 40 MG tablet TAKE ONE TABLET BY MOUTH TWICE DAILY BEFORE MEALS (Patient not taking: Reported on 10/10/2024) 60 tablet 5   psyllium (METAMUCIL) 58.6 % packet Take 1 packet by mouth as needed. (Patient not taking: Reported on 10/10/2024)     triamcinolone  ointment (KENALOG ) 0.1 % Apply 1 Application topically 3 (three) times daily. As needed for itching (Patient not taking: Reported on 10/10/2024) 80 g 2   No current facility-administered medications for this visit.     ALLERGIES: Bee venom, Bacitracin -polymyxin b, Clarithromycin, Diclofenac , Neosporin [neomycin -bacitracin  zn-polymyx], and Hydrocortisone   Family History  Problem Relation Age of Onset   Heart disease Mother    Hypertension Mother    Osteoporosis Mother    Congestive Heart Failure Mother    Pancreatic cancer Father    Heart attack Brother    Drug abuse Brother        over dose    Healthy Son    Healthy Son    Colon cancer Neg Hx    Colon polyps Neg Hx    Rectal cancer Neg Hx    Stomach cancer Neg Hx    Breast cancer Neg Hx     Social History   Socioeconomic History   Marital status: Married    Spouse name: Not on file   Number of children: 2   Years of education: 15   Highest education level: Some college, no degree  Occupational History   Occupation: retired    Associate Professor: RETIRED  Tobacco Use   Smoking status: Former    Current packs/day: 0.00    Types: Cigarettes    Start date: 1963    Quit date: 1973    Years  since quitting: 52.8    Passive exposure: Never    Smokeless tobacco: Never   Tobacco comments:    Former smoker 06/25/22  Vaping Use   Vaping status: Never Used  Substance and Sexual Activity   Alcohol use: Yes    Alcohol/week: 1.0 standard drink of alcohol    Types: 1 Glasses of wine per week    Comment: Socially   Drug use: No   Sexual activity: Not Currently    Birth control/protection: Post-menopausal, None    Comment: 1st intercourse 88 yo-Fewer than 5 partners  Other Topics Concern   Not on file  Social History Narrative   Regular exercise-yesDaily caffeine use   Right handed   Lives alone   retired   Social Drivers of Corporate Investment Banker Strain: Low Risk  (05/07/2024)   Overall Financial Resource Strain (CARDIA)    Difficulty of Paying Living Expenses: Not hard at all  Food Insecurity: Low Risk  (06/04/2024)   Received from [facility]   Hunger Vital Sign    Within the past 12 months, you worried that your food would run out before you got money to buy more: Never true    Within the past 12 months, the food you bought just didn't last and you didn't have money to get more. : Never true  Transportation Needs: No Transportation Needs (06/04/2024)   Received from [facility]    In the past 12 months, has lack of reliable transportation kept you from medical appointments, meetings, work or from getting things needed for daily living? : No  Physical Activity: Sufficiently Active (05/07/2024)   Exercise Vital Sign    Days of Exercise per Week: 5 days    Minutes of Exercise per Session: 30 min  Stress: No Stress Concern Present (05/07/2024)   [employer] of Occupational Health - Occupational Stress Questionnaire    Feeling of Stress : Not at all  Social Connections: Moderately Integrated (05/07/2024)   Social Connection and Isolation Panel    Frequency of Communication with Friends and Family: Twice a week    Frequency of Social Gatherings with Friends and Family: Twice a week    Attends  Religious Services: More than 4 times per year    Active Member of [employer] or Organizations: Yes    Attends Banker Meetings: More than 4 times per year    Marital Status: Widowed  Intimate Partner Violence: Not At Risk (05/07/2024)   Humiliation, Afraid, Rape, and Kick questionnaire    Fear of Current or Ex-Partner: No    Emotionally Abused: No    Physically Abused: No    Sexually Abused: No    Review of Systems  See HPI  PHYSICAL EXAMINATION:   BP 114/62 (BP Location: Left Arm, Patient Position: Sitting)   Pulse 88   SpO2 96%     General appearance: alert, cooperative and appears stated age   EMB: Consent for procedure. Time out done. Sterile prep with Hibiclens  Paracervical block with 10 cc 1% lidocaine , lot [id number], exp Feb. 2028  Tenaculum to anterior cervical lip. Pipelle passed to     7     cm three times.   Tissue to pathology.  Minimal EBL. No complications.   Chaperone was present for exam:  Kari HERO, CMA  ASSESSMENT:  Endometrial thickening.  Fluid in the endometrial canal.  Pelvic discomfort.   PLAN:  Follow up endometrial biopsy.  Final plan to follow.  Skip  estrogen dose this week and restart next week.  Ok to use Aquaphor to external vulva as needed.

## 2024-10-11 ENCOUNTER — Telehealth: Payer: Self-pay

## 2024-10-11 NOTE — Telephone Encounter (Signed)
 Patient notified of recommendations.

## 2024-10-11 NOTE — Telephone Encounter (Signed)
 Patient called & said she was having some discomfort  from the biopsy & wanted to know if this was normal and what she could take. Pt is aware this is normal and for her to take otc tylenol . She was appreciative of call.

## 2024-10-11 NOTE — Telephone Encounter (Signed)
 Patient left message on triage line stating she is experiencing discomfort, requesting recommendations.   Call returned to patient, left detailed message advising ok to OTC Tylenol . Return call to [facility] triage at [phone], opt 4 to provide update.

## 2024-10-12 LAB — SURGICAL PATHOLOGY

## 2024-10-15 ENCOUNTER — Ambulatory Visit: Payer: Self-pay | Admitting: Obstetrics and Gynecology

## 2024-10-15 NOTE — Telephone Encounter (Signed)
 Patient called and left a message on the triage line. She states that she still has a lot of vaginal discomfort. Not pelvic pain but external pain of the vulva area. She is requesting something to make her feel better. Please advise. She does states that is thankful her endo bx results are normal.

## 2024-10-16 ENCOUNTER — Telehealth: Payer: Self-pay

## 2024-10-16 ENCOUNTER — Ambulatory Visit: Payer: Self-pay

## 2024-10-16 DIAGNOSIS — R102 Pelvic and perineal pain unspecified side: Secondary | ICD-10-CM

## 2024-10-16 NOTE — Telephone Encounter (Signed)
 FYI Only or Action Required?: FYI only for provider: appointment scheduled on 10/18/24.  Patient was last seen in primary care on 09/12/2024 by Plotnikov, Karlynn GAILS, MD.  Called Nurse Triage reporting Vaginal Pain.  Symptoms began several days ago.  Interventions attempted: Nothing.  Symptoms are: unchanged.  Triage Disposition: Call Specialist Today, See Physician Within 24 Hours  Patient/caregiver understands and will follow disposition?: Yes        Reason for Disposition  [1] MILD to MODERATE vaginal pain AND [2] present > 24 hours  (Exception: Chronic pain.)    Pt already has appt scheduled on 11/20 with PCP.  Pt mistakenly thought she had called [facility] [place] for a sooner appt. Triager did confirm PCP appt on 11/20 was the soonest she can be seen, and encouraged her to reach out to GYN for appt tomorrow.  Triager provided correct GYN of  number. Patient verbalized understanding.  Answer Assessment - Initial Assessment Questions 1. SYMPTOM: What's the main symptom you're concerned about? (e.g., pain, itching, dryness)     Uncomfortable 2. LOCATION: Where is the  sx located? (e.g., inside/outside, left/right)     inside 3. ONSET: When did the  sx  start?     10 days 4. PAIN: Is there any pain? If Yes, ask: How bad is it? (Scale: 1-10; mild, moderate, severe)     Yes, uncomfortable 5. ITCHING: Is there any itching? If Yes, ask: How bad is it? (Scale: 1-10; mild, moderate, severe)     denies 6. CAUSE: What do you think is causing the discharge? Have you had the same problem before? What happened then?     Occasional burning with urination 7. OTHER SYMPTOMS: Do you have any other symptoms? (e.g., fever, itching, vaginal bleeding, pain with urination, injury to genital area, vaginal foreign body)     *No Answer* 8. PREGNANCY: Is there any chance you are pregnant? When was your last menstrual period?     N/a  Protocols used:  Vaginal Symptoms-A-AH

## 2024-10-16 NOTE — Telephone Encounter (Signed)
 Patient spoke to nursing supervisor ealier today. Patient just called back twice in the past 20 mins complaining of the symptoms she mentioned this morning. She said she was having vaginal discomfort and she wants to know what to do.  I called her back but had to leave a message.

## 2024-10-16 NOTE — Telephone Encounter (Signed)
 Patient called and left 2 messages on triage voicemail within 1hr. She states she feels uncomfortable in the vaginal area and thinks she may need to come in. She would like a call back.

## 2024-10-16 NOTE — Telephone Encounter (Signed)
 Spoke with patient and Maryanna. Patient reports no changes in pelvic discomfort, denies any new symptoms. Started vaginal estrogen again last night. Advised per Dr. Cyrilla recommendations dated 10/15/24.   Patient is scheduled to see PCP on 10/18/24. Patient will call if she develops any bleeding.  Patient will call if any additional f/u needed after PCP visit.   Patient and Victoriano verbalize understanding.   Routing to provider for final review. Patient is agreeable to disposition. Will close encounter.

## 2024-10-16 NOTE — Telephone Encounter (Signed)
 Copied from CRM [id number]. Topic: Appointments - Scheduling Inquiry for Clinic >> Oct 16, 2024 11:14 AM China J wrote: Reason for CRM: The patient is wondering if Dr. Garald had something sooner on his schedule to see her. She has been sick for 10 days now and says that she's been very uncomfortable. She is scheduled for Thursday but does not want to wait that long or see someone else.

## 2024-10-17 ENCOUNTER — Ambulatory Visit: Payer: Self-pay

## 2024-10-17 DIAGNOSIS — H0011 Chalazion right upper eyelid: Secondary | ICD-10-CM | POA: Diagnosis not present

## 2024-10-17 DIAGNOSIS — H0014 Chalazion left upper eyelid: Secondary | ICD-10-CM | POA: Diagnosis not present

## 2024-10-17 NOTE — Telephone Encounter (Signed)
 F/U from triage yesterday.  Reason for Disposition  Question about upcoming scheduled surgery, procedure or test, no triage required, and triager able to answer question  Answer Assessment - Initial Assessment Questions 1. REASON FOR CALL: What is the main reason for your call? or How can I best help you?  Pt triaged yesterday, was requesting earlier apt. Offered pt same day with an alternate provider. She refused, stating she would prefer to wait until tomorrow to see her PCP. Pt did contact GYN and states they told her to see her PCP.  Protocols used: Information Only Call - No Triage-A-AH   Copied from CRM [id number]. Topic: Clinical - Red Word Triage >> Oct 17, 2024 12:00 PM Alfonso ORN wrote: Red Word that prompted transfer to Nurse Triage: extreme discomfort in pelvic/vaginal area (appt scheduled for it tomorrow)

## 2024-10-18 ENCOUNTER — Encounter: Payer: Self-pay | Admitting: Internal Medicine

## 2024-10-18 ENCOUNTER — Ambulatory Visit (INDEPENDENT_AMBULATORY_CARE_PROVIDER_SITE_OTHER): Admitting: Internal Medicine

## 2024-10-18 VITALS — BP 128/76 | HR 80 | Ht 60.0 in | Wt 103.2 lb

## 2024-10-18 DIAGNOSIS — N949 Unspecified condition associated with female genital organs and menstrual cycle: Secondary | ICD-10-CM | POA: Diagnosis not present

## 2024-10-18 DIAGNOSIS — G301 Alzheimer's disease with late onset: Secondary | ICD-10-CM

## 2024-10-18 DIAGNOSIS — I10 Essential (primary) hypertension: Secondary | ICD-10-CM | POA: Diagnosis not present

## 2024-10-18 DIAGNOSIS — F02A4 Dementia in other diseases classified elsewhere, mild, with anxiety: Secondary | ICD-10-CM | POA: Diagnosis not present

## 2024-10-18 DIAGNOSIS — J3089 Other allergic rhinitis: Secondary | ICD-10-CM

## 2024-10-18 NOTE — Progress Notes (Signed)
 Subjective:  Patient ID: [name], female    DOB: September 09, 1936  Age: 88 y.o. MRN: [id number]  CC: Pelvic Pain   HPI [name] presents for vaginal discomfort.  Armella denies vaginal or pelvic pain.  There is no vaginal burning per se.  No bladder spasms.  No dysuria.  No vaginal discharge She has been on estradiol  cream vaginally twice a week-no effect Dr Nikki r/o gynecologic issues.  Recent pelvic ultrasound showed endometrial thickening.  Fluid in the endometrial canal.  Fibroids.  She was then advised to see her primary care physician. [name] is here with her aide Omega who helps with history. Follow-up on allergies, dementia   Outpatient Medications Prior to Visit  Medication Sig Dispense Refill   acetaminophen  (TYLENOL ) 500 MG tablet Take 1,000 mg by mouth every 6 (six) hours as needed for mild pain.     albuterol  (VENTOLIN  HFA) 108 (90 Base) MCG/ACT inhaler Take 2 puffs every 4-6 hours as needed     amLODipine  (NORVASC ) 2.5 MG tablet Take 2.5 mg by mouth as needed.     Ascorbic Acid (VITAMIN C ) 100 MG tablet Take 1 tablet (100 mg total) by mouth daily. 100 tablet 1   azelastine  (ASTELIN ) 0.1 % nasal spray 1 spray in each nostril Nasally Once a day in the evening     Calcium  Carbonate-Vitamin D  (CALCIUM  CARBONATE W/VITAMIN D  PO) Take 1 tablet by mouth daily.     cyanocobalamin  (VITAMIN B12) 1000 MCG tablet Take 1 tablet (1,000 mcg total) by mouth daily. 100 tablet 3   EPINEPHrine  (EPIPEN  2-PAK) 0.3 mg/0.3 mL IJ SOAJ injection Inject 0.3 mLs (0.3 mg total) into the muscle as needed. 1 Device 1   erythromycin  ophthalmic ointment Place 1 Application into both eyes as needed.     estradiol  (ESTRACE ) 0.01 % CREA vaginal cream Use 1 gram vaginally 3 times a week at bedtime 42.5 g 12   famotidine  (PEPCID ) 40 MG tablet Take 40 mg by mouth as needed for heartburn or indigestion.     fluticasone  (FLONASE ) 50 MCG/ACT nasal spray Place 2 sprays into both nostrils daily. 16 g 6    guaiFENesin -dextromethorphan (ROBITUSSIN DM) 100-10 MG/5ML syrup Take 5-10 mLs by mouth every 4 (four) hours as needed for cough (chest congestion). 240 mL 0   ipratropium (ATROVENT ) 0.06 % nasal spray Place 2 sprays into both nostrils 2 (two) times daily as needed for rhinitis.     Lactase (LACTAID PO) Take by mouth.     levocetirizine (XYZAL ) 5 MG tablet Take 1 tablet (5 mg total) by mouth daily. 90 tablet 3   levothyroxine  (SYNTHROID ) 50 MCG tablet TAKE ONE TABLET BY MOUTH DAILY BEFORE BREAKFAST 30 tablet 5   LORazepam  (ATIVAN ) 1 MG tablet Take 2-3 tablets at bedtime and 1-2 tablets in the daytime as needed. 150 tablet 2   memantine  (NAMENDA  XR) 14 MG CP24 24 hr capsule Take 1 capsule (14 mg total) by mouth at bedtime. 90 capsule 11   mirabegron  ER (MYRBETRIQ ) 25 MG TB24 tablet Take 1 tablet (25 mg total) by mouth daily. 90 tablet 0   polyethylene glycol powder (GLYCOLAX /MIRALAX ) 17 GM/SCOOP powder Take 17 g by mouth 2 (two) times daily as needed for moderate constipation. 500 g 3   UNABLE TO FIND Med Name: CBD oil - under tongue     desonide  (DESOWEN ) 0.05 % cream Apply 1 application topically as needed. (Patient not taking: Reported on 10/18/2024)     hydrocortisone  (ANUSOL -HC)  2.5 % rectal cream Place 1 Application rectally as needed for hemorrhoids or anal itching. (Patient not taking: Reported on 10/18/2024)     Magnesium  Sulfate (EPSOM SALT) POWD UAD for warm water soaks (Patient not taking: Reported on 10/18/2024) 500 g 0   MegaRed Omega-3 Krill Oil 500 MG CAPS Take 1 capsule by mouth every morning. (Patient not taking: Reported on 10/18/2024) 100 capsule 3   pantoprazole  (PROTONIX ) 40 MG tablet TAKE ONE TABLET BY MOUTH TWICE DAILY BEFORE MEALS (Patient not taking: Reported on 10/18/2024) 60 tablet 5   psyllium (METAMUCIL) 58.6 % packet Take 1 packet by mouth as needed. (Patient not taking: Reported on 10/18/2024)     triamcinolone  ointment (KENALOG ) 0.1 % Apply 1 Application topically 3  (three) times daily. As needed for itching (Patient not taking: Reported on 10/18/2024) 80 g 2   No facility-administered medications prior to visit.    ROS: Review of Systems  Constitutional:  Negative for activity change, appetite change, chills, fatigue and unexpected weight change.  HENT:  Positive for congestion. Negative for mouth sores and sinus pressure.   Eyes:  Negative for visual disturbance.  Respiratory:  Negative for cough and chest tightness.   Gastrointestinal:  Negative for abdominal pain and nausea.  Genitourinary:  Positive for frequency and urgency. Negative for difficulty urinating, dysuria and vaginal pain.  Musculoskeletal:  Positive for arthralgias, back pain, neck pain and neck stiffness. Negative for gait problem.  Skin:  Negative for pallor and rash.  Neurological:  Negative for dizziness, tremors, weakness, numbness and headaches.  Hematological:  Negative for adenopathy. Bruises/bleeds easily.  Psychiatric/Behavioral:  Positive for decreased concentration. Negative for confusion, sleep disturbance and suicidal ideas. The patient is nervous/anxious.     Objective:  BP 128/76   Pulse 80   Ht 5' (1.524 m)   Wt 103 lb 3.2 oz (46.8 kg)   SpO2 94%   BMI 20.15 kg/m   BP Readings from Last 3 Encounters:  10/18/24 128/76  10/10/24 114/62  10/03/24 122/76    Wt Readings from Last 3 Encounters:  10/18/24 103 lb 3.2 oz (46.8 kg)  09/26/24 104 lb (47.2 kg)  09/12/24 104 lb 8 oz (47.4 kg)    Physical Exam Constitutional:      General: She is not in acute distress.    Appearance: Normal appearance. She is well-developed.  HENT:     Head: Normocephalic.     Right Ear: External ear normal.     Left Ear: External ear normal.     Nose: Nose normal.  Eyes:     General:        Right eye: No discharge.        Left eye: No discharge.     Conjunctiva/sclera: Conjunctivae normal.     Pupils: Pupils are equal, round, and reactive to light.  Neck:      Thyroid : No thyromegaly.     Vascular: No JVD.     Trachea: No tracheal deviation.  Cardiovascular:     Rate and Rhythm: Normal rate and regular rhythm.     Heart sounds: Normal heart sounds.  Pulmonary:     Effort: No respiratory distress.     Breath sounds: No stridor. No wheezing.  Abdominal:     General: Bowel sounds are normal. There is no distension.     Palpations: Abdomen is soft. There is no mass.     Tenderness: There is no abdominal tenderness. There is no guarding or rebound.  Musculoskeletal:  General: No tenderness.     Cervical back: Normal range of motion and neck supple. No rigidity.  Lymphadenopathy:     Cervical: No cervical adenopathy.  Skin:    Findings: No erythema or rash.  Neurological:     Mental Status: Mental status is at baseline.     Cranial Nerves: No cranial nerve deficit.     Motor: No abnormal muscle tone.     Coordination: Coordination abnormal.     Gait: Gait abnormal.     Deep Tendon Reflexes: Reflexes normal.  Psychiatric:        Behavior: Behavior normal.        Thought Content: Thought content normal.        Judgment: Judgment normal.   Mikaella is using a walker  Lab Results  Component Value Date   WBC 6.4 09/12/2024   HGB 13.0 09/12/2024   HCT 38.1 09/12/2024   PLT 314.0 09/12/2024   GLUCOSE 93 09/12/2024   CHOL 274 (H) 02/15/2024   TRIG 181.0 (H) 02/15/2024   HDL 72.00 02/15/2024   LDLDIRECT 98.0 08/13/2021   LDLCALC 166 (H) 02/15/2024   ALT 15 09/12/2024   AST 20 09/12/2024   NA 132 (L) 09/12/2024   K 3.9 09/12/2024   CL 96 09/12/2024   CREATININE 0.62 09/12/2024   BUN 12 09/12/2024   CO2 29 09/12/2024   TSH 1.63 07/02/2024   HGBA1C 5.5 06/13/2020    No results found.  Assessment & Plan:   Problem List Items Addressed This Visit     Allergic rhinitis   Seems to be doing better on current regimen      Alzheimer's dementia (HCC)   Stable.  We will continue on current meds. Neurology f/u w/Sara Dina   On Namenda       Essential hypertension, benign   Chronic Take Norvasc  2.5 mg daily  SBP ok at home      Vaginal discomfort - Primary   Unclear etiology.  Differential diagnosis includes painful bladder, atrophic vaginitis, etc.  Isley denies vaginal or pelvic pain.  There is no vaginal burning per se.  No bladder spasms.  No dysuria.  No vaginal discharge She has been on estradiol  cream vaginally twice a week-no effect Dr Nikki r/o gynecologic issues.  Recent pelvic ultrasound showed endometrial thickening.  Fluid in the endometrial canal.  Fibroids.  She was then advised to see her primary care physician. Kynslie is here with her aide Omega who helps with history. Will try empiric diazepam vaginal suppositories.  Use estradiol  cream vaginally once a day for a couple weeks then reduce down to twice a week if effective          No orders of the defined types were placed in this encounter.     Follow-up: Return in about 4 weeks (around 11/15/2024) for a follow-up visit.  Marolyn Noel, MD

## 2024-10-18 NOTE — Patient Instructions (Addendum)
 Overactive/painfil bladder  Boxers for women Use estrogen cream daily Heated rice sock between the legs 3-4 times a day Use Lidocaine  vaginal cream

## 2024-10-19 ENCOUNTER — Telehealth: Payer: Self-pay

## 2024-10-19 NOTE — Telephone Encounter (Unsigned)
 Copied from CRM [id number]. Topic: Clinical - Medical Advice >> Oct 19, 2024 11:07 AM [name] wrote: Reason for CRM: Patient called in with home health aide to verify how to use estradiol  (ESTRACE ) 0.01 % CREA vaginal cream on a daily basis Patient is requesting a call back to verify how the medication is suppose to be administered >> Oct 19, 2024 12:22 PM Franky GRADE wrote: Patient is calling to follow up on this request, she would also like to know if there is a specific Lidocaine  vaginal cream that she should get? >> Oct 19, 2024 11:40 AM Alfonso ORN wrote: Pt called back to f/u on medication question. Advised cma is awaiting response . Please contact pt with update

## 2024-10-19 NOTE — Telephone Encounter (Signed)
 Copied from CRM [id number]. Topic: Clinical - Medical Advice >> Oct 19, 2024 11:07 AM [name] wrote: Reason for CRM: Patient called in with home health aide to verify how to use estradiol  (ESTRACE ) 0.01 % CREA vaginal cream on a daily basis Patient is requesting a call back to verify how the medication is suppose to be administered

## 2024-10-21 DIAGNOSIS — N949 Unspecified condition associated with female genital organs and menstrual cycle: Secondary | ICD-10-CM | POA: Insufficient documentation

## 2024-10-21 NOTE — Assessment & Plan Note (Signed)
 Seems to be doing better on current regimen

## 2024-10-21 NOTE — Assessment & Plan Note (Signed)
 Unclear etiology.  Differential diagnosis includes painful bladder, atrophic vaginitis, etc.  [name] denies vaginal or pelvic pain.  There is no vaginal burning per se.  No bladder spasms.  No dysuria.  No vaginal discharge She has been on estradiol  cream vaginally twice a week-no effect Dr Nikki r/o gynecologic issues.  Recent pelvic ultrasound showed endometrial thickening.  Fluid in the endometrial canal.  Fibroids.  She was then advised to see her primary care physician. [name] is here with her aide Omega who helps with history. Will try empiric diazepam vaginal suppositories.  Use estradiol  cream vaginally once a day for a couple weeks then reduce down to twice a week if effective

## 2024-10-21 NOTE — Assessment & Plan Note (Signed)
 Chronic Take Norvasc  2.5 mg daily  SBP ok at home

## 2024-10-21 NOTE — Assessment & Plan Note (Signed)
 Stable.  We will continue on current meds. Neurology f/u w/Sara Dina  On Namenda 

## 2024-10-22 ENCOUNTER — Ambulatory Visit: Payer: Self-pay

## 2024-10-22 ENCOUNTER — Telehealth: Payer: Self-pay

## 2024-10-22 DIAGNOSIS — R102 Pelvic and perineal pain unspecified side: Secondary | ICD-10-CM

## 2024-10-22 DIAGNOSIS — N949 Unspecified condition associated with female genital organs and menstrual cycle: Secondary | ICD-10-CM

## 2024-10-22 NOTE — Telephone Encounter (Signed)
 Please make an appointment to see me.  I recommend a pap.  I would also recommend a pelvic and abdominal CT scan with contrast as the patient's symptoms of vaginal/pelvic pain are ongoing.

## 2024-10-22 NOTE — Telephone Encounter (Signed)
 Good morning!  Please advise on triage note below. Separate telephone note sent regarding patient issue also

## 2024-10-22 NOTE — Telephone Encounter (Signed)
 Spoke with patient and Omega.  Patient reports increasing pelvic discomfort. Denies vaginal bleeding, odor, discharge or urinary symptoms.   I inquired about her visit with PCP last week, patient states no changes. Advised per review of PCP 10/18/24 OV note, Will try empiric diazepam vaginal suppositories. Use estradiol  cream vaginally once a day for a couple weeks then reduce down to twice a week if effective .   Patient does not recall conversation. Omega said that the medication could not be found in the system at that time of visit to be ordered, PCP was going to f/u. Instructed patient to f/u with PCP on RX.   Advised I will provide update to Dr. Nikki and return call with any additional recommendations. Patient and Omega agreeable.  Routing to Dr. Nikki

## 2024-10-22 NOTE — Telephone Encounter (Signed)
 FYI Only or Action Required?: Action required by provider: request for appointment.  Patient was last seen in primary care on 10/18/2024 by Plotnikov, Karlynn GAILS, MD.  Called Nurse Triage reporting Vaginal Pain.  Symptoms began a week ago.  Interventions attempted: Prescription medications:  SABRA  Symptoms are: gradually worsening.Vaginal pain is worsening. Requests to be worked in with PCP only. Asking to be worked in today. Please advise pt.  Triage Disposition: See HCP Within 4 Hours (Or PCP Triage)  Patient/caregiver understands and will follow disposition?: Yes      Copied from CRM [id number]. Topic: Clinical - Red Word Triage >> Oct 22, 2024  9:44 AM Mesmerise C wrote: [facility] that prompted transfer to Nurse Triage: Patient stated she's not feeling well states she's having vaginal pain states she used the cream and it burned very badly would like to see Dr. Volney today Reason for Disposition  [1] SEVERE vaginal pain AND [2] not improved 2 hours after pain medicine  Answer Assessment - Initial Assessment Questions 1. SYMPTOM: What's the main symptom you're concerned about? (e.g., pain, itching, dryness)     pain 2. LOCATION: Where is the   located? (e.g., inside/outside, left/right)     inside 3. ONSET: When did the    start?     Last week 4. PAIN: Is there any pain? If Yes, ask: How bad is it? (Scale: 1-10; mild, moderate, severe)     moderate 5. ITCHING: Is there any itching? If Yes, ask: How bad is it? (Scale: 1-10; mild, moderate, severe)     no 6. CAUSE: What do you think is causing the discharge? Have you had the same problem before? What happened then?     unsure 7. OTHER SYMPTOMS: Do you have any other symptoms? (e.g., fever, itching, vaginal bleeding, pain with urination, injury to genital area, vaginal foreign body)     Low back pain 8. PREGNANCY: Is there any chance you are pregnant? When was your last menstrual period?      no  Protocols used: Vaginal Symptoms-A-AH

## 2024-10-22 NOTE — Telephone Encounter (Signed)
 [name] called and left a message on triage voicemail saying she needed to see Dr Nikki because she is not feeling well. She said the scheduling department tried to offer her an appt with another provider due to no openings with Dr Nikki but she feels this is unacceptable due to her always seeing Dr Nikki. She would like a call back.

## 2024-10-22 NOTE — Telephone Encounter (Signed)
 Patient called triage line and left a message for Kate to call her back regarding needing help.

## 2024-10-22 NOTE — Telephone Encounter (Signed)
 Thank you for the update.  I will be happy to see her on 10/31/24.

## 2024-10-22 NOTE — Telephone Encounter (Signed)
 Copied from CRM [id number]. Topic: Clinical - Medication Question >> Oct 22, 2024 11:16 AM [name] wrote: Reason for CRM: Patient is requesting vaginal suppositories because Dr. Garald stated that he would prescribed some. She is requesting a follow up and call back with an answer and to go over how often she is to use the estradiol  (ESTRACE ) 0.01 % CREA vaginal cream >> Oct 22, 2024  2:12 PM [name] wrote: Patient is calling back in regarding this, wanting to followup on this would like a callback

## 2024-10-22 NOTE — Telephone Encounter (Signed)
 Spoke with patient. Advised waiting to hear back from Dr. Nikki. Will f/u once I have a response. Patient agreeable.   Per review of EPIC, patient has reached out to PCP, awaiting response regarding suppositories.

## 2024-10-22 NOTE — Telephone Encounter (Signed)
 Copied from CRM [id number]. Topic: Clinical - Medication Question >> Oct 22, 2024 11:16 AM [name] wrote: Reason for CRM: Patient is requesting vaginal suppositories because Dr. Garald stated that he would prescribed some. She is requesting a follow up and call back with an answer and to go over how often she is to use the estradiol  (ESTRACE ) 0.01 % CREA vaginal cream

## 2024-10-22 NOTE — Telephone Encounter (Signed)
 Spoke with patient. Patient advised Omega has already left for the day. Patient requested recommendations. Advised per Dr. Nikki. Patient request to proceed with OV, declines CT scan at this time. Patient request to wait and further discuss with Dr. Nikki first. OV scheduled for 10/31/24. Patient will call back with [facility] to review Dr. Cyrilla recommendations.   Order placed for CT as written per Dr. Nikki, if patient changes her mind.   Will leave in [facility] Triage Rico for return call.   To Dr. Nikki LIPS.

## 2024-10-23 NOTE — Telephone Encounter (Signed)
 Patient called and left a message on triage line. She states Omega is not with her right now. Patient states she did go ahead and schedule the CT scan. Patient will callback to go over the information regarding Dr Ike recommendations and when her appt is in our office when Omega is with her.

## 2024-10-23 NOTE — Telephone Encounter (Signed)
 [name] called and stated she was called by [facility] regarding her CT scan that she has scheduled. She said they told her she needed to come by to get the contrast that she will need to drink at home. [name] states that she has a hard time drinking stuff at home and is worried about it messing up her stomach. She said in the past they gave her stuff at the imaging center to do before the imaging. She said they told her she needed to call us  and have us  change her order. Please review and advise.

## 2024-10-23 NOTE — Telephone Encounter (Signed)
 I recommend she keep her appointment with me in December for us  to review this further.   I would not do the CT scan before I see her in the office.   I recommend that her Omega comes with her to her next office visit with me.

## 2024-10-24 NOTE — Telephone Encounter (Signed)
 PCP has stated I ordered pelvic MRI scan Continue with current meds Thanks  Pt did not answer my call.. Please inform pt of the above upon her call back to the clinic

## 2024-10-24 NOTE — Telephone Encounter (Signed)
 Spoke with the pt and was able to inform her that she is to go have an MRI done. Pt has stated understanding.

## 2024-10-24 NOTE — Telephone Encounter (Signed)
 I ordered pelvic MRI scan Continue with current meds Thanks

## 2024-10-24 NOTE — Telephone Encounter (Signed)
 Spoke with patient. Patients son [name] is with her, call placed on speaker to discuss with son and patient. Advised per Dr. Nikki. Patient is currently scheduled for CT scan on 12/1, she reports GI concerns with contrast. Advised I will call to cancel scan currently scheduled for 12/1, keep OV with Dr. Nikki on 10/31/24.  Patient became agitated, did not want to discuss care with son. I asked if someone would be accompanying her to her appt with Dr. Nikki on 12/3, she advised Omega would be with her. No additional questions. Patient read back appt with Dr. Nikki, 12/3 at 1130.   Call placed to GI, spoke with Jenella, CT scan cancelled.   Per review of EPIC, PCP has also ordered MRI pelvis.   Routing to Dr. Silva FYI

## 2024-10-24 NOTE — Telephone Encounter (Unsigned)
 Copied from CRM [id number]. Topic: Clinical - Medical Advice >> Oct 24, 2024 10:29 AM Thersia BROCKS wrote: Reason for CRM: Patient called in stated she wanted to know if she needs to do the CT and MRI  , Patient stated her Gyno did CT and Dr.Plotnikov did MRI wanted to know if both are necessary

## 2024-10-24 NOTE — Telephone Encounter (Signed)
 Issue addressed in separate telephone note

## 2024-10-24 NOTE — Telephone Encounter (Signed)
 Call returned to patient, spoke with patients Son [name], ok per dpr.   Advised per Dr. Nikki. Was advised they have reached out to [facility] to schedule, will update once scheduled. Scott advised Omega will accompany Ms. Bebout to her appointments, Omega is in contact with [facility] and [place] daily to provide updates. [name] states they are currently working to address memory concerns. Scott states estradiol  is not working for pelvic discomfort, is requesting alternative medication or to discontinue. Advised this will be discussed at upcoming appt. Scott states PCP has requested GYN manage ongoing pelvic discomfort. I advised Dr. Nikki has evaluated patient, so far negative GYN work up,  recommended patient return to see PCP to determine if any anything additional was going on with bladder, bowels or musculoskeletal system. Continue with MRI as ordered by PCP. Scott verbalizes understanding and appreciative of update.   Routing FISERV

## 2024-10-24 NOTE — Telephone Encounter (Signed)
 I already addressed the issue.  I ordered pelvic MRI scan.  Given instructions how to use estrogen vaginal cream.  Take Tylenol  for pain.  Use heating pad to relieve discomfort.  Thank you

## 2024-10-24 NOTE — Addendum Note (Signed)
 Addended by: Kevan Prouty V on: 10/24/2024 07:47 AM   Modules accepted: Orders

## 2024-10-24 NOTE — Telephone Encounter (Signed)
 [name] should use vaginal estrogen once a day at night/bedtime for now.  When her vaginal symptoms are better, she can switch to 3 times a week x 4 weeks, then once or twice a week for maintenance.  Thank you

## 2024-10-24 NOTE — Telephone Encounter (Signed)
 Thank you for the update!

## 2024-10-24 NOTE — Telephone Encounter (Signed)
 I would recommend proceeding first with the pelvic MRI as ordered by her PCP.

## 2024-10-29 ENCOUNTER — Inpatient Hospital Stay: Admission: RE | Admit: 2024-10-29 | Source: Ambulatory Visit

## 2024-10-31 ENCOUNTER — Encounter: Payer: Self-pay | Admitting: Obstetrics and Gynecology

## 2024-10-31 ENCOUNTER — Ambulatory Visit: Admitting: Obstetrics and Gynecology

## 2024-10-31 ENCOUNTER — Other Ambulatory Visit (HOSPITAL_COMMUNITY)
Admission: RE | Admit: 2024-10-31 | Discharge: 2024-10-31 | Disposition: A | Discharge status: Home / Self Care | Source: Ambulatory Visit | Attending: Obstetrics and Gynecology | Admitting: Obstetrics and Gynecology

## 2024-10-31 ENCOUNTER — Telehealth: Payer: Self-pay | Admitting: Internal Medicine

## 2024-10-31 VITALS — BP 114/66 | HR 73

## 2024-10-31 DIAGNOSIS — Z124 Encounter for screening for malignant neoplasm of cervix: Secondary | ICD-10-CM | POA: Diagnosis not present

## 2024-10-31 DIAGNOSIS — N94818 Other vulvodynia: Secondary | ICD-10-CM

## 2024-10-31 DIAGNOSIS — N949 Unspecified condition associated with female genital organs and menstrual cycle: Secondary | ICD-10-CM | POA: Diagnosis not present

## 2024-10-31 NOTE — Progress Notes (Unsigned)
 GYNECOLOGY  VISIT   HPI: 88 y.o.   Married  Caucasian female   [phone] with No LMP recorded. Patient is postmenopausal.   here for: Pelvic discomfort & pap smear. Stopped estrace , only using Aquaphor.  Vaginal estrogen cream has caused burning.    Son Ubaldo is on the phone.  [name], her housekeeper, is not with her today.    She states she is uncomfortable and not in pain.  Discomfort is near the opening of the vagina.  Discomfort is a 5 or 6 out of 10.   It is annoying. She states can live with it.    No bleeding.    She had a pelvic ultrasound 10/03/24 showing endometrial stripe of 4.40 mm and fluid in the endometrial canal. The endometrium was avascular.  She has 2 fibroids:  2.09 cm and 1.66 cm.  Her ovaries were normal.  An endometrial biopsy 10/10/24 showed predominant endocervical mucosa with limited lower uterine segment endometrium negative for hyperplasia, atypia, or malignancy.   Sees urology for overactive bladder and is treated with Myrbetriq .  She is treated with Prolia  for osteoporosis.    PCP has ordered a pelvic MRI, which has not been scheduled yet.   GYNECOLOGIC HISTORY: No LMP recorded. Patient is postmenopausal. Contraception:  PMP Menopausal hormone therapy:  estrace  Last 2 paps:  08/07/21 neg, 07/03/20 neg  History of abnormal Pap or positive HPV:  no Mammogram:  04/16/24 Breast Density Cat B, BIRADS Cat 1 neg          OB History     Gravida  3   Para  2   Term  2   Preterm      AB  1   Living  2      SAB  1   IAB      Ectopic      Multiple      Live Births                 Patient Active Problem List   Diagnosis Date Noted   Vaginal discomfort 10/21/2024   Chronic rhinitis 09/12/2024   Paronychia of left middle finger 05/22/2024   Spasm of right trapezius muscle 05/03/2024   Postnasal drip 04/30/2024   Mild cognitive impairment with memory loss [G31.84] 04/16/2024   Abrasion of skin 04/09/2024   Episode of confusion  04/02/2024   Allergic rhinitis 01/24/2024   Wheezing 01/24/2024   Statin intolerance 01/09/2024   Chronic low back pain 11/28/2023   Acute pain of right shoulder 11/28/2023   Dyslipidemia 11/28/2023   Alzheimer's dementia (HCC) 09/15/2023   CHF (congestive heart failure)    Hypotension 07/13/2023   Skin ulcer of nose 07/13/2023   Chalazion left upper eyelid 04/18/2023   Bruising 04/06/2023   Pain of right sacroiliac joint 01/14/2023   Lumbar degenerative disc disease 12/21/2022   Chronic left SI joint pain 11/30/2022   Inflammation of sacroiliac joint 11/16/2022   Stool incontinence 11/16/2022   Grief 10/06/2022   History of basal cell carcinoma 09/29/2022   Stress at home 07/21/2022   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   Gait disorder 02/28/2022   Melanocytic nevi of trunk 02/22/2022   Seborrheic dermatitis 02/22/2022   Urticaria 02/22/2022   Concussion 10/29/2021   Allergic rhinitis due to animal (cat) (dog) hair and dander 08/07/2021   Allergic rhinitis due to pollen 08/07/2021   Mild intermittent asthma 08/07/2021   Cervical spine instability 05/15/2021   Cervical spinal cord compression 05/04/2021  Degenerative disc disease, cervical 03/11/2021   Spondylolisthesis, cervical region 03/11/2021   Trochanteric bursitis of right hip 09/22/2020   Meningioma 03/25/2020   Cholelithiasis 09/09/2019   Cervical spondylosis 01/05/2019   Neck pain 12/26/2018   Generalized anxiety disorder 08/22/2018   Contact dermatitis and eczema 06/14/2018   Constipation 11/11/2016   Sinusitis, chronic 08/06/2016   Rash 04/20/2016   Aortic valve sclerosis 03/12/2015   Fatigue 09/08/2014   Increased frequency of urination 08/19/2014   Urinary urgency 08/19/2014   Postherpetic neuralgia 10/02/2013   Cerumen impaction 07/16/2013   Insomnia 01/24/2013   Upper respiratory infection 08/03/2012   Pruritus of skin 03/11/2012   MVP (mitral valve prolapse)    Multiple thyroid  nodules  10/04/2011   Herpes zoster 09/07/2011   Essential hypertension, benign 07/27/2011   Vaginitis    Hyperlipemia, mixed 03/15/2011   OAB (overactive bladder) 09/02/2010   Hypothyroidism 03/03/2010   Irritable bowel syndrome 09/04/2008   History of colonic polyps 09/04/2008   Atrophic gastritis 09/03/2008   Duodenitis 09/03/2008   Tibialis tendinitis 03/20/2008   Osteoarthritis 02/25/2008   Lactose intolerance 11/20/2007   GERD (gastroesophageal reflux disease) 11/20/2007   Osteoporosis 11/20/2007    Past Medical History:  Diagnosis Date   Allergic rhinitis due to animal (cat) (dog) hair and dander 08/07/2021   Allergic rhinitis due to pollen 08/07/2021   Aortic valve sclerosis 03/12/2015   Atrophic gastritis 09/03/2008   Atrophic vaginitis    Baker's cyst    Left-Dr. Melodi   Bladder pain 08/19/2014   Chronic  Take Vesicare  10 mg/d  UA was ok  On Premarin cream PV per GYN  On Vesicare   F/u w/Dr JONELLE Moats     Cataract    Dr. Octavia   Cervical spinal cord compression 05/04/2021   Cervical spine instability 05/15/2021   Dr. Unice: plan -  posterior C1-2 decompression and instrumented fusion.  MRI: Progressive soft tissue pannus at C1-2 is now creating mass  effect on the craniocervical junction with distortion of the upper  spinal cord. This is likely related rheumatoid arthritis  C spine CT: Fracture of the anterior arch of C1 with 12 mm separation. Nondisplaced fracture posterior arch of C1. There is later   Cervical spondylosis 01/05/2019   Chalazion left upper eyelid 04/18/2023   X2  Keflex  po - too big... Will use Ceftin   F/u w/Ophthalmology   CHF (congestive heart failure)    Cholelithiasis 09/09/2019   Per Dr Abran: At this point we mutually decided to watch her abdominal complaints.  If symptoms accelerate or become more classic for symptomatic cholelithiasis, then she is agreeable to surgical referral.  She will keep me posted   Chronic left SI joint pain 11/30/2022    Closed fracture of first cervical vertebra 08/07/2021   Closed fracture of second cervical vertebra 08/07/2021   Concussion 10/29/2021   s/p a hard fall after she rushed and slipped on the wet marble floor while visiting family in [place]  a couple week ago.  She hit the side of her skull, had a skin laceration bled quite a bit.  She was dazed and according to her son [name] had a loss of consciousness of under a minute duration.  She felt dazed.  She was taken to ER.  2 staples were applied to her skin laceration on the left o   Constipation 11/11/2016   Chronic, multifactorial  Miralax  prn  7/22 Postop constipation related to oxycodone  and Robaxin .  We can discontinue oxycodone  Robaxin .  The patient will use tramadol  and Tylenol  instead.  Use MiraLAX  or Senokot as to produce soft regular stools.  Discontinue Pepto-Bismol.  I think this should help with abdominal bloating complaint.      Take Metamucil daily  Senokot as to produce soft regular sto   Contact dermatitis and eczema 06/14/2018   Eczema vs other - 7/19  Cortaid prn  Depo-medrol  IM 80 mg  Remove nail polish     Contusion of left chest wall 01/09/2015   Johnston fell and broke a rib #8 on the L last week, she had a rib X ray and a CT (Dr Jane).   Contusion of right knee 08/12/2016   R knee   Degenerative disc disease, cervical 03/11/2021   Duodenitis 09/03/2008   Essential hypertension, benign 07/27/2011   Chronic, mild  NAS diet  Cardiac CT scan for calcium  scoring offered 1/20  1/24 HTN and s/p ER visit on 12/08/22 for SBP 180. Per Dr Randol:  Discussed with the patient that her restarting donepezil  could have impacted her symptoms today.  However, her blood pressure continues to be mildly elevated.  Will place her on 2.5 mg amlodipine  to take if her blood pressure is over 170/100.  Nursing aide at   Fatigue 09/08/2014   10/15, 2/16, 9/17, 2/18  No other sx  Erina is managing it with short rest periods 1-2/d     Fibroid    Gait disorder  02/28/2022   Worse  Multifactorial  Cont w/PT  In the balance class  Balance class q 1 week     [name] resolved after doing exercises by Dr Harvey 11/2021     Generalized anxiety disorder 08/22/2018   Chronic   Lexapro  - not taking  Weighted blanket  Lorazepam  prn, - tolerance has developed   Potential benefits of a long term benzodiazepines  use as well as potential risks  and complications were explained to the patient and were aknowledged.     GERD (gastroesophageal reflux disease) 11/20/2007   Chronic. Better on Gluten free diet  Dexilant    Potential benefits of a long term PPI  use as well as potential risks  and complications were explained to the patient and were aknowledged.  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     History of basal cell carcinoma 09/29/2022   History of colonic polyps 09/04/2008   Hyperlipemia, mixed 03/15/2011   Chronic  On Simvastatin      Hypotension 07/13/2023   Stop Amlodipine      Hypothyroidism    Impacted cerumen of right ear 07/16/2013   Increased frequency of urination 08/19/2014   Inflammation of sacroiliac joint 11/16/2022   Insomnia 01/24/2013   Lorazepam  prn   Potential benefits of a long term benzodiazepines  use as well as potential risks  and complications were explained to the patient and were aknowledged.        Irritable bowel syndrome 09/04/2008   Chronic   Abran MD, Norleen SAILOR  2013 - much better on gluten free diet              Lactose intolerance 11/20/2007   Lactaid        Lumbar degenerative disc disease  12/21/2022   Based on MRI and her exam there is significant DDD in lumbar spine     Lumbar spondylosis    Melanocytic nevi of trunk 02/22/2022   Meningioma 03/25/2020   Dr Unice     Mild cognitive impairment with memory loss 09/15/2023   Mild intermittent  asthma 08/07/2021   Multiple thyroid  nodules 10/04/2011   2013 she was started on Synthroid  - tol well  2017, 2022 US  OK     MVP (mitral valve prolapse)    Antibiotics required for dental procedures   Neck pain 12/26/2018   Chronic  PT, traction, TENs unit was offered  Tramadol  prn   Potential benefits of a long term opioids use as well as potential risks (i.e. addiction risk, apnea etc) and complications (i.e. Somnolence, constipation and others) were explained to the patient and were aknowledged.        OAB (overactive bladder) 09/02/2010   Dr Nicholaus  Use Solifenacin  prn  Worse - ?multifactorial  UA was normal  Camie increased Memantine  to 10 mg bid a month ago     Osteoarthritis 02/25/2008   Dr Bonner  Blue-Emu cream was recommended to use 2-3 times a day        Osteoporosis 11/20/2007   Chronic  Per GYN        Overactive bladder    Pain of right sacroiliac joint 01/14/2023   Postherpetic neuralgia 10/02/2013   Pruritus of skin 03/11/2012   S/p H zoster vaccination  Relapsing  8/12 - on face  8/14 - R ear/face  10/14 ?     Sacroiliitis    Seborrheic dermatitis 02/22/2022   Sinusitis, chronic 08/06/2016   Worse  Flonase , Atrovent  nasal, Singulair , Claritin   ENT ref Dr Thaddeus  CT sinuses     Skin ulcer of nose 07/13/2023   Soft pads on the nose  Derm appt     Spondylolisthesis, cervical region 03/11/2021   Stool incontinence 11/16/2022   12/23  Worse.  Stool leakage - new  D/c Aricept   Abd X ray to rule out constipation     Tibialis tendinitis 03/20/2008   Torn meniscus    bilateral   Trochanteric bursitis of right hip 09/22/2020   Upper respiratory infection 08/03/2012   9/13 - 3 wks  2/17  11/18 - asthmatic bronchitis     Urinary urgency 08/19/2014   2023 worse.  Obtain urinalysis.  D/c Aricept   UA was ok  On Premarin cream PV per GYN  On Vesicare   F/u w/Dr JONELLE Nicholaus     Urticaria 02/22/2022    Past Surgical History:  Procedure Laterality Date   CATARACT EXTRACTION, BILATERAL      EYE SURGERY     POSTERIOR CERVICAL FUSION/FORAMINOTOMY N/A 05/15/2021   Procedure: Cervical One Laminectomy with resection of cyst, Fixation from Occiput to Cervical Four;  Surgeon: Unice Pac, MD;  Location[facility];  Service: Neurosurgery;  Laterality: N/A;   TONSILLECTOMY      Current Outpatient Medications  Medication Sig Dispense Refill   acetaminophen  (TYLENOL ) 500 MG tablet Take 1,000 mg by mouth every 6 (six) hours as needed for mild pain.     albuterol  (VENTOLIN  HFA) 108 (90 Base) MCG/ACT inhaler Take 2 puffs every 4-6 hours as needed     amLODipine  (NORVASC ) 2.5 MG tablet Take 2.5 mg by mouth as needed.     Ascorbic Acid (VITAMIN C ) 100 MG tablet Take 1 tablet (100 mg total) by mouth daily. 100 tablet 1  azelastine  (ASTELIN ) 0.1 % nasal spray 1 spray in each nostril Nasally Once a day in the evening     Calcium  Carbonate-Vitamin D  (CALCIUM  CARBONATE W/VITAMIN D  PO) Take 1 tablet by mouth daily.     cyanocobalamin  (VITAMIN B12) 1000 MCG tablet Take 1 tablet (1,000 mcg total) by mouth daily. 100 tablet 3   desonide  (DESOWEN ) 0.05 % cream Apply 1 application topically as needed.     EPINEPHrine  (EPIPEN  2-PAK) 0.3 mg/0.3 mL IJ SOAJ injection Inject 0.3 mLs (0.3 mg total) into the muscle as needed. 1 Device 1   erythromycin  ophthalmic ointment Place 1 Application into both eyes as needed.     famotidine  (PEPCID ) 40 MG tablet Take 40 mg by mouth as needed for heartburn or indigestion.     fluticasone  (FLONASE ) 50 MCG/ACT nasal spray Place 2 sprays into both nostrils daily. 16 g 6   guaiFENesin -dextromethorphan (ROBITUSSIN DM) 100-10 MG/5ML syrup Take 5-10 mLs by mouth every 4 (four) hours as needed for cough (chest congestion). 240 mL 0   hydrocortisone  (ANUSOL -HC) 2.5 % rectal cream Place 1 Application rectally as needed for hemorrhoids or anal itching.     ipratropium (ATROVENT ) 0.06 % nasal spray Place 2 sprays into both nostrils 2 (two) times daily as needed for rhinitis.      Lactase (LACTAID PO) Take by mouth.     levocetirizine (XYZAL ) 5 MG tablet Take 1 tablet (5 mg total) by mouth daily. 90 tablet 3   levothyroxine  (SYNTHROID ) 50 MCG tablet TAKE ONE TABLET BY MOUTH DAILY BEFORE BREAKFAST 30 tablet 5   LORazepam  (ATIVAN ) 1 MG tablet Take 2-3 tablets at bedtime and 1-2 tablets in the daytime as needed. 150 tablet 2   memantine  (NAMENDA  XR) 14 MG CP24 24 hr capsule Take 1 capsule (14 mg total) by mouth at bedtime. 90 capsule 11   MYRBETRIQ  50 MG TB24 tablet SMARTSIG:1 Tablet(s) By Mouth     polyethylene glycol powder (GLYCOLAX /MIRALAX ) 17 GM/SCOOP powder Take 17 g by mouth 2 (two) times daily as needed for moderate constipation. 500 g 3   UNABLE TO FIND Med Name: CBD oil - under tongue     estradiol  (ESTRACE ) 0.01 % CREA vaginal cream Use 1 gram vaginally 3 times a week at bedtime (Patient not taking: Reported on 10/31/2024) 42.5 g 12   Magnesium  Sulfate (EPSOM SALT) POWD UAD for warm water soaks (Patient not taking: Reported on 10/31/2024) 500 g 0   MegaRed Omega-3 Krill Oil 500 MG CAPS Take 1 capsule by mouth every morning. (Patient not taking: Reported on 10/31/2024) 100 capsule 3   pantoprazole  (PROTONIX ) 40 MG tablet TAKE ONE TABLET BY MOUTH TWICE DAILY BEFORE MEALS (Patient not taking: Reported on 10/31/2024) 60 tablet 5   psyllium (METAMUCIL) 58.6 % packet Take 1 packet by mouth as needed. (Patient not taking: Reported on 10/31/2024)     triamcinolone  ointment (KENALOG ) 0.1 % Apply 1 Application topically 3 (three) times daily. As needed for itching (Patient not taking: Reported on 10/31/2024) 80 g 2   No current facility-administered medications for this visit.     ALLERGIES: Bee venom, Bacitracin -polymyxin b, Clarithromycin, Diclofenac , Neosporin [neomycin -bacitracin  zn-polymyx], and Hydrocortisone   Family History  Problem Relation Age of Onset   Heart disease Mother    Hypertension Mother    Osteoporosis Mother    Congestive Heart Failure Mother     Pancreatic cancer Father    Heart attack Brother    Drug abuse Brother  over dose    Healthy Son    Healthy Son    Colon cancer Neg Hx    Colon polyps Neg Hx    Rectal cancer Neg Hx    Stomach cancer Neg Hx    Breast cancer Neg Hx     Social History   Socioeconomic History   Marital status: Married    Spouse name: Not on file   Number of children: 2   Years of education: 15   Highest education level: Some college, no degree  Occupational History   Occupation: retired    Associate Professor: RETIRED  Tobacco Use   Smoking status: Former    Current packs/day: 0.00    Types: Cigarettes    Start date: 1963    Quit date: 1973    Years since quitting: 52.9    Passive exposure: Never   Smokeless tobacco: Never   Tobacco comments:    Former smoker 06/25/22  Vaping Use   Vaping status: Never Used  Substance and Sexual Activity   Alcohol use: Yes    Alcohol/week: 1.0 standard drink of alcohol    Types: 1 Glasses of wine per week    Comment: Socially   Drug use: No   Sexual activity: Not Currently    Birth control/protection: Post-menopausal, None    Comment: 1st intercourse 88 yo-Fewer than 5 partners  Other Topics Concern   Not on file  Social History Narrative   Regular exercise-yesDaily caffeine use   Right handed   Lives alone   retired   Social Drivers of Corporate Investment Banker Strain: Low Risk  (05/07/2024)   Overall Financial Resource Strain (CARDIA)    Difficulty of Paying Living Expenses: Not hard at all  Food Insecurity: Low Risk  (06/04/2024)   Received from [facility]   Hunger Vital Sign    Within the past 12 months, you worried that your food would run out before you got money to buy more: Never true    Within the past 12 months, the food you bought just didn't last and you didn't have money to get more. : Never true  Transportation Needs: No Transportation Needs (06/04/2024)   Received from [facility]    In the past 12 months,  has lack of reliable transportation kept you from medical appointments, meetings, work or from getting things needed for daily living? : No  Physical Activity: Sufficiently Active (05/07/2024)   Exercise Vital Sign    Days of Exercise per Week: 5 days    Minutes of Exercise per Session: 30 min  Stress: No Stress Concern Present (05/07/2024)   [employer] of Occupational Health - Occupational Stress Questionnaire    Feeling of Stress : Not at all  Social Connections: Moderately Integrated (05/07/2024)   Social Connection and Isolation Panel    Frequency of Communication with Friends and Family: Twice a week    Frequency of Social Gatherings with Friends and Family: Twice a week    Attends Religious Services: More than 4 times per year    Active Member of [employer] or Organizations: Yes    Attends Banker Meetings: More than 4 times per year    Marital Status: Widowed  Intimate Partner Violence: Not At Risk (05/07/2024)   Humiliation, Afraid, Rape, and Kick questionnaire    Fear of Current or Ex-Partner: No    Emotionally Abused: No    Physically Abused: No    Sexually Abused: No  Review of Systems  See HPI.   PHYSICAL EXAMINATION:   BP 114/66 (BP Location: Left Arm, Patient Position: Sitting)   Pulse 73   SpO2 99%     General appearance: alert, cooperative and appears stated age   Pelvic: External genitalia:  no lesions              Urethra:  normal appearing urethra with no masses, tenderness or lesions              Bartholins and Skenes: normal                 Vagina: normal appearing vagina with normal color and discharge, no lesions.  Discomfort at introitus, left vaginal wall, then midline suprapubically.               Cervix: no lesions.  No CMT.                 Bimanual Exam:  Uterus:  normal size, contour, position, consistency, mobility, non-tender              Adnexa: no mass, fullness, tenderness        Chaperone was present for exam:  Kari HERO,  CMA  ASSESSMENT:  Vaginal/vulvar discomfort.  Cervical cancer screening.   PLAN:  Pap and HR HPV today.   Awaiting pelvic MRI through PCP.  Stop vaginal estrogen.  Ok for [employer] at bedtime. Rx for gabapentin  cream 6% to vulva once a day.  Patient instructed where to use on the vulva.  [employer].   Follow up here in 4 weeks.  Will send office note to patient's PCP.   30 min  total time was spent for this patient encounter, including preparation, face-to-face counseling with the patient, coordination of care, and documentation of the encounter.

## 2024-10-31 NOTE — Patient Instructions (Signed)
 Place a thin layer of gabapentin  cream to the vulva in the morning after a shower.  (Do not place this in the vaginal canal.)  Place the Aquafor at night to the vulva if desired. (Do not place this in the vaginal canal.)

## 2024-10-31 NOTE — Telephone Encounter (Unsigned)
 Copied from CRM #[id number]. Topic: Clinical - Medication Refill >> Oct 31, 2024 10:27 AM Jasmin G wrote: Medication: fluticasone  (FLONASE ) 50 MCG/ACT nasal spray  Has the patient contacted their pharmacy? Yes (Agent: If no, request that the patient contact the pharmacy for the refill. If patient does not wish to contact the pharmacy document the reason why and proceed with request.) (Agent: If yes, when and what did the pharmacy advise?)  This is the patient's preferred pharmacy:  [facility] [place], [place] - [address] [facility] [place] [address] Phone: [phone] Fax: [phone]  Is this the correct pharmacy for this prescription? Yes If no, delete pharmacy and type the correct one.   Has the prescription been filled recently? Yes  Is the patient out of the medication? Yes  Has the patient been seen for an appointment in the last year OR does the patient have an upcoming appointment? Yes  Can we respond through MyChart? No  Agent: Please be advised that Rx refills may take up to 3 business days. We ask that you follow-up with your pharmacy.

## 2024-11-01 ENCOUNTER — Telehealth: Payer: Self-pay | Admitting: *Deleted

## 2024-11-01 ENCOUNTER — Ambulatory Visit: Payer: Self-pay | Admitting: Obstetrics and Gynecology

## 2024-11-01 LAB — CYTOLOGY - PAP: Diagnosis: NEGATIVE

## 2024-11-01 MED ORDER — NONFORMULARY OR COMPOUNDED ITEM: 1 refills | Status: DC

## 2024-11-01 NOTE — Telephone Encounter (Signed)
 Patient left message on triage line. States she was seen in the office yesterday, Rx was supposed to be called into [facility]. Patient states she called [facility] and no Rx available.   Per review of 10/31/24 OV, patient to consider gabapentin  cream to vulva once a day.   Dr. Nikki -please advise on RX

## 2024-11-01 NOTE — Telephone Encounter (Signed)
 Gabapentin  rx was faxed in to [facility]. I also called them to verify they had gotten the rx. I called the patient to let her know that they will call her once the rx is ready. She is aware to stop vaginal estrogen, that she can use aquafor at bedtime and to use gabapentin  cream to vulva once a day.

## 2024-11-01 NOTE — Telephone Encounter (Signed)
 Compounded gabapentin  cream prescription printed.  Will give to triage in office to fax to [facility].

## 2024-11-05 ENCOUNTER — Ambulatory Visit: Admitting: Internal Medicine

## 2024-11-05 NOTE — Progress Notes (Deleted)
 Office Visit Note  Patient: [name]             Date of Birth: Sep 25, 1936           MRN: [id number]             PCP: Garald Karlynn GAILS, MD Referring: Garald Karlynn GAILS, MD Visit Date: 11/15/2024 Occupation: Data Unavailable  Subjective:  No chief complaint on file.   History of Present Illness: [name] is a 88 y.o. female ***     Activities of Daily Living:  Patient reports morning stiffness for *** {minute/hour:19697}.   Patient {ACTIONS;DENIES/REPORTS:21021675::Denies} nocturnal pain.  Difficulty dressing/grooming: {ACTIONS;DENIES/REPORTS:21021675::Denies} Difficulty climbing stairs: {ACTIONS;DENIES/REPORTS:21021675::Denies} Difficulty getting out of chair: {ACTIONS;DENIES/REPORTS:21021675::Denies} Difficulty using hands for taps, buttons, cutlery, and/or writing: {ACTIONS;DENIES/REPORTS:21021675::Denies}  No Rheumatology ROS completed.   PMFS History:  Patient Active Problem List   Diagnosis Date Noted   Vaginal discomfort 10/21/2024   Chronic rhinitis 09/12/2024   Paronychia of left middle finger 05/22/2024   Spasm of right trapezius muscle 05/03/2024   Postnasal drip 04/30/2024   Mild cognitive impairment with memory loss [G31.84] 04/16/2024   Abrasion of skin 04/09/2024   Episode of confusion 04/02/2024   Allergic rhinitis 01/24/2024   Wheezing 01/24/2024   Statin intolerance 01/09/2024   Chronic low back pain 11/28/2023   Acute pain of right shoulder 11/28/2023   Dyslipidemia 11/28/2023   Alzheimer's dementia (HCC) 09/15/2023   CHF (congestive heart failure)    Hypotension 07/13/2023   Skin ulcer of nose 07/13/2023   Chalazion left upper eyelid 04/18/2023   Bruising 04/06/2023   Pain of right sacroiliac joint 01/14/2023   Lumbar degenerative disc disease 12/21/2022   Chronic left SI joint pain 11/30/2022   Inflammation of sacroiliac joint 11/16/2022   Stool incontinence 11/16/2022   Grief 10/06/2022   History of basal cell  carcinoma 09/29/2022   Stress at home 07/21/2022   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   Gait disorder 02/28/2022   Melanocytic nevi of trunk 02/22/2022   Seborrheic dermatitis 02/22/2022   Urticaria 02/22/2022   Concussion 10/29/2021   Allergic rhinitis due to animal (cat) (dog) hair and dander 08/07/2021   Allergic rhinitis due to pollen 08/07/2021   Mild intermittent asthma 08/07/2021   Cervical spine instability 05/15/2021   Cervical spinal cord compression 05/04/2021   Degenerative disc disease, cervical 03/11/2021   Spondylolisthesis, cervical region 03/11/2021   Trochanteric bursitis of right hip 09/22/2020   Meningioma 03/25/2020   Cholelithiasis 09/09/2019   Cervical spondylosis 01/05/2019   Neck pain 12/26/2018   Generalized anxiety disorder 08/22/2018   Contact dermatitis and eczema 06/14/2018   Constipation 11/11/2016   Sinusitis, chronic 08/06/2016   Rash 04/20/2016   Aortic valve sclerosis 03/12/2015   Fatigue 09/08/2014   Increased frequency of urination 08/19/2014   Urinary urgency 08/19/2014   Postherpetic neuralgia 10/02/2013   Cerumen impaction 07/16/2013   Insomnia 01/24/2013   Upper respiratory infection 08/03/2012   Pruritus of skin 03/11/2012   MVP (mitral valve prolapse)    Multiple thyroid  nodules 10/04/2011   Herpes zoster 09/07/2011   Essential hypertension, benign 07/27/2011   Vaginitis    Hyperlipemia, mixed 03/15/2011   OAB (overactive bladder) 09/02/2010   Hypothyroidism 03/03/2010   Irritable bowel syndrome 09/04/2008   History of colonic polyps 09/04/2008   Atrophic gastritis 09/03/2008   Duodenitis 09/03/2008   Tibialis tendinitis 03/20/2008   Osteoarthritis 02/25/2008   Lactose intolerance 11/20/2007   GERD (gastroesophageal reflux disease) 11/20/2007   Osteoporosis  11/20/2007    Past Medical History:  Diagnosis Date   Allergic rhinitis due to animal (cat) (dog) hair and dander 08/07/2021   Allergic rhinitis due to pollen  08/07/2021   Aortic valve sclerosis 03/12/2015   Atrophic gastritis 09/03/2008   Atrophic vaginitis    Baker's cyst    Left-Dr. Aluisio   Bladder pain 08/19/2014   Chronic  Take Vesicare  10 mg/d  UA was ok  On Premarin cream PV per GYN  On Vesicare   F/u w/Dr JONELLE Moats     Cataract    Dr. Octavia   Cervical spinal cord compression 05/04/2021   Cervical spine instability 05/15/2021   Dr. Unice: plan -  posterior C1-2 decompression and instrumented fusion.  MRI: Progressive soft tissue pannus at C1-2 is now creating mass  effect on the craniocervical junction with distortion of the upper  spinal cord. This is likely related rheumatoid arthritis  C spine CT: Fracture of the anterior arch of C1 with 12 mm separation. Nondisplaced fracture posterior arch of C1. There is later   Cervical spondylosis 01/05/2019   Chalazion left upper eyelid 04/18/2023   X2  Keflex  po - too big... Will use Ceftin   F/u w/Ophthalmology   CHF (congestive heart failure)    Cholelithiasis 09/09/2019   Per Dr Abran: At this point we mutually decided to watch her abdominal complaints.  If symptoms accelerate or become more classic for symptomatic cholelithiasis, then she is agreeable to surgical referral.  She will keep me posted   Chronic left SI joint pain 11/30/2022   Closed fracture of first cervical vertebra 08/07/2021   Closed fracture of second cervical vertebra 08/07/2021   Concussion 10/29/2021   s/p a hard fall after she rushed and slipped on the wet marble floor while visiting family in [place]  a couple week ago.  She hit the side of her skull, had a skin laceration bled quite a bit.  She was dazed and according to her son [name] had a loss of consciousness of under a minute duration.  She felt dazed.  She was taken to ER.  2 staples were applied to her skin laceration on the left o   Constipation 11/11/2016   Chronic, multifactorial  Miralax  prn  7/22 Postop constipation related to oxycodone  and Robaxin .  We can  discontinue oxycodone  Robaxin .  The patient will use tramadol  and Tylenol  instead.  Use MiraLAX  or Senokot as to produce soft regular stools.  Discontinue Pepto-Bismol.  I think this should help with abdominal bloating complaint.      Take Metamucil daily  Senokot as to produce soft regular sto   Contact dermatitis and eczema 06/14/2018   Eczema vs other - 7/19  Cortaid prn  Depo-medrol  IM 80 mg  Remove nail polish     Contusion of left chest wall 01/09/2015   Johnston fell and broke a rib #8 on the L last week, she had a rib X ray and a CT (Dr Jane).   Contusion of right knee 08/12/2016   R knee   Degenerative disc disease, cervical 03/11/2021   Duodenitis 09/03/2008   Essential hypertension, benign 07/27/2011   Chronic, mild  NAS diet  Cardiac CT scan for calcium  scoring offered 1/20  1/24 HTN and s/p ER visit on 12/08/22 for SBP 180. Per Dr Randol:  Discussed with the patient that her restarting donepezil  could have impacted her symptoms today.  However, her blood pressure continues to be mildly elevated.  Will place her on 2.5  mg amlodipine  to take if her blood pressure is over 170/100.  Nursing aide at   Fatigue 09/08/2014   10/15, 2/16, 9/17, 2/18  No other sx  Takya is managing it with short rest periods 1-2/d     Fibroid    Gait disorder 02/28/2022   Worse  Multifactorial  Cont w/PT  In the balance class  Balance class q 1 week     [name] resolved after doing exercises by Dr Harvey 11/2021     Generalized anxiety disorder 08/22/2018   Chronic   Lexapro  - not taking  Weighted blanket  Lorazepam  prn, - tolerance has developed   Potential benefits of a long term benzodiazepines  use as well as potential risks  and complications were explained to the patient and were aknowledged.     GERD (gastroesophageal reflux disease) 11/20/2007   Chronic. Better on Gluten free diet  Dexilant    Potential benefits of a long term PPI  use as well as potential risks  and complications were explained to the patient  and were aknowledged.  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     History of basal cell carcinoma 09/29/2022   History of colonic polyps 09/04/2008   Hyperlipemia, mixed 03/15/2011   Chronic  On Simvastatin      Hypotension 07/13/2023   Stop Amlodipine      Hypothyroidism    Impacted cerumen of right ear 07/16/2013   Increased frequency of urination 08/19/2014   Inflammation of sacroiliac joint 11/16/2022   Insomnia 01/24/2013   Lorazepam  prn   Potential benefits of a long term benzodiazepines  use as well as potential risks  and complications were explained to the patient and were aknowledged.        Irritable bowel syndrome 09/04/2008   Chronic   Abran MD, Norleen SAILOR  2013 - much better on gluten free diet              Lactose intolerance 11/20/2007   Lactaid        Lumbar degenerative disc disease 12/21/2022   Based on MRI and her exam there is significant DDD in lumbar spine     Lumbar spondylosis    Melanocytic nevi of trunk 02/22/2022   Meningioma 03/25/2020   Dr Unice     Mild cognitive impairment with memory loss 09/15/2023   Mild intermittent asthma 08/07/2021   Multiple thyroid  nodules 10/04/2011   2013 she was started on Synthroid  - tol well  2017, 2022 US  OK     MVP (mitral valve prolapse)    Antibiotics required for dental procedures   Neck pain 12/26/2018   Chronic  PT, traction, TENs unit was offered  Tramadol  prn   Potential benefits of a long term opioids use as well as potential risks (i.e. addiction risk, apnea etc) and complications (i.e. Somnolence, constipation and others) were explained to the patient and were aknowledged.        OAB (overactive bladder) 09/02/2010   Dr Nicholaus  Use Solifenacin  prn  Worse - ?multifactorial  UA was normal  Camie increased Memantine  to 10 mg bid a  month ago     Osteoarthritis 02/25/2008   Dr Bonner  Blue-Emu cream was  recommended to use 2-3 times a day        Osteoporosis 11/20/2007   Chronic  Per GYN        Overactive bladder    Pain of right sacroiliac joint 01/14/2023   Postherpetic neuralgia 10/02/2013   Pruritus of skin 03/11/2012   S/p H zoster vaccination  Relapsing  8/12 - on face  8/14 - R ear/face  10/14 ?     Sacroiliitis    Seborrheic dermatitis 02/22/2022   Sinusitis, chronic 08/06/2016   Worse  Flonase , Atrovent  nasal, Singulair , Claritin   ENT ref Dr Thaddeus  CT sinuses     Skin ulcer of nose 07/13/2023   Soft pads on the nose  Derm appt     Spondylolisthesis, cervical region 03/11/2021   Stool incontinence 11/16/2022   12/23  Worse.  Stool leakage - new  D/c Aricept   Abd X ray to rule out constipation     Tibialis tendinitis 03/20/2008   Torn meniscus    bilateral   Trochanteric bursitis of right hip 09/22/2020   Upper respiratory infection 08/03/2012   9/13 - 3 wks  2/17  11/18 - asthmatic bronchitis     Urinary urgency 08/19/2014   2023 worse.  Obtain urinalysis.  D/c Aricept   UA was ok  On Premarin cream PV per GYN  On Vesicare   F/u w/Dr JONELLE Moats     Urticaria 02/22/2022    Family History  Problem Relation Age of Onset   Heart disease Mother    Hypertension Mother    Osteoporosis Mother    Congestive Heart Failure Mother    Pancreatic cancer Father    Heart attack Brother    Drug abuse Brother        over dose    Healthy Son    Healthy Son    Colon cancer Neg Hx    Colon polyps Neg Hx    Rectal cancer Neg Hx    Stomach cancer Neg Hx    Breast cancer Neg Hx    Past Surgical History:  Procedure Laterality Date   CATARACT EXTRACTION, BILATERAL     EYE SURGERY     POSTERIOR CERVICAL FUSION/FORAMINOTOMY N/A 05/15/2021   Procedure: Cervical One Laminectomy with resection of cyst, Fixation from Occiput to Cervical Four;  Surgeon: Unice Pac, MD;  Location[facility] OR;  Service: Neurosurgery;  Laterality:  N/A;   TONSILLECTOMY     Social History   Tobacco Use   Smoking status: Former    Current packs/day: 0.00    Types: Cigarettes    Start date: 1963    Quit date: 1973    Years since quitting: 52.9    Passive exposure: Never   Smokeless tobacco: Never   Tobacco comments:    Former smoker 06/25/22  Vaping Use   Vaping status: Never Used  Substance Use Topics   Alcohol use: Yes    Alcohol/week: 1.0 standard drink of alcohol    Types: 1 Glasses of wine per week    Comment: Socially   Drug use: No   Social History   Social History Narrative   Regular exercise-yesDaily caffeine use   Right handed   Lives alone   retired     Immunization History  Administered Date(s) Administered   Fluad Quad(high Dose 65+) 08/04/2019, 09/16/2021, 09/12/2022   Fluad Trivalent(High Dose 65+) 09/01/2023   INFLUENZA, HIGH DOSE SEASONAL PF 09/03/2013, 02/19/2015, 11/12/2015, 08/06/2016, 11/11/2016, 08/20/2017, 08/31/2017, 09/20/2018, 09/12/2019   Influenza Whole 09/29/2007, 08/21/2008, 09/02/2010,  07/30/2012   Influenza,inj,Quad PF,6+ Mos 12/17/2014, 07/30/2015   PFIZER(Purple Top)SARS-COV-2 Vaccination 12/12/2019, 12/31/2019, 07/29/2020, 07/29/2020, 07/31/2020, 09/10/2020   Pfizer Covid-19 Vaccine Bivalent Booster 98yrs & up 08/21/2021   Pfizer(Comirnaty )Fall Seasonal Vaccine 12 years and older 09/29/2022, 09/11/2024   Pneumococcal Conjugate-13 01/09/2014   Pneumococcal Polysaccharide-23 09/26/2006, 08/12/2015, 11/11/2016, 08/31/2017, 09/10/2020, 09/10/2021   Td 04/14/2010   Tdap 06/18/2020   Typhoid Inactivated 02/23/2013   Zoster, Live 12/09/2006     Objective: Vital Signs: There were no vitals taken for this visit.   Physical Exam   Musculoskeletal Exam: ***  CDAI Exam: CDAI Score: -- Patient Global: --; Provider Global: -- Swollen: --; Tender: -- Joint Exam 11/15/2024   No joint exam has been documented for this visit   There is currently no information documented on the  homunculus. Go to the Rheumatology activity and complete the homunculus joint exam.  Investigation: No additional findings.  Imaging: No results found.  Recent Labs: Lab Results  Component Value Date   WBC 6.4 09/12/2024   HGB 13.0 09/12/2024   PLT 314.0 09/12/2024   NA 132 (L) 09/12/2024   K 3.9 09/12/2024   CL 96 09/12/2024   CO2 29 09/12/2024   GLUCOSE 93 09/12/2024   BUN 12 09/12/2024   CREATININE 0.62 09/12/2024   BILITOT 0.6 09/12/2024   ALKPHOS 38 (L) 09/12/2024   AST 20 09/12/2024   ALT 15 09/12/2024   PROT 7.4 09/12/2024   ALBUMIN  4.4 09/12/2024   CALCIUM  9.3 09/12/2024   GFRAA 87 07/17/2020    Speciality Comments: No specialty comments available.  Procedures:  No procedures performed Allergies: Bee venom, Bacitracin -polymyxin b, Clarithromycin, Diclofenac , Neosporin [neomycin -bacitracin  zn-polymyx], and Hydrocortisone    Assessment / Plan:     Visit Diagnoses: No diagnosis found.  Orders: No orders of the defined types were placed in this encounter.  No orders of the defined types were placed in this encounter.   Face-to-face time spent with patient was *** minutes. Greater than 50% of time was spent in counseling and coordination of care.  Follow-Up Instructions: No follow-ups on file.   Daved JAYSON Gavel, CMA  Note - This record has been created using Animal nutritionist.  Chart creation errors have been sought, but may not always  have been located. Such creation errors do not reflect on  the standard of medical care.

## 2024-11-06 ENCOUNTER — Encounter: Payer: Self-pay | Admitting: Physician Assistant

## 2024-11-06 ENCOUNTER — Ambulatory Visit: Admitting: Physician Assistant

## 2024-11-06 VITALS — BP 121/70 | HR 72 | Resp 20 | Wt 105.0 lb

## 2024-11-06 DIAGNOSIS — R413 Other amnesia: Secondary | ICD-10-CM | POA: Insufficient documentation

## 2024-11-06 DIAGNOSIS — G3184 Mild cognitive impairment, so stated: Secondary | ICD-10-CM

## 2024-11-06 MED ORDER — MEMANTINE HCL ER 21 MG PO CP24: 21.0000 mg | ORAL_CAPSULE | Freq: Every evening | ORAL | 3 refills | Status: AC

## 2024-11-06 NOTE — Progress Notes (Signed)
 Assessment & Plan  Mild cognitive impairment with memory loss   [name] is a very pleasant 88 y.o. RH female with a history of hypertension, CHF, h/o posterior L frontal meningioma (16 x 11 by 17 mmm on 03/2021)  hyperlipidemia, situational depression, anxiety, hypothyroidism, GERD, IBS, OA and a prior diagnosis of mild cognitive impairment with memory loss, with concern for Alzheimer's disease per neuropsych evaluation in 2024 seen today in follow up for memory loss. Patient is currently on memantine  XR 14 mg nightly.   Patient was last seen on 04/16/2024 .  MMSE today is  27/30. Patient is able to participate on ADLs, no longer drives.  Mood is chronically anxious. In the past, with discussed increasing the dose to 21 mg nightly but she politely declined, however, after today's performance,we discussed increasing it and she now agrees.  Memory affected by mood, particularly on days when she feels unwell. No disorientation, personality changes, hallucinations, seizures, or sleep disturbances. No recent falls or head injuries. No signs of stroke. Sons report potential medication adherence issues when she feels unwell. This patient is accompanied in the office by her caregiver [name] who supplements the history.  Previous records as well as any outside records available were reviewed prior to todays visit    -Follow-up May 23, 2025 at 1130 Continue memantine  XR, consider increasing it to 21 mg nightly, currently at 14 mg nightly Repeat neuropsych evaluation prior to the next visit around May 2026 Repeat MRI of the brain to follow-up on her known meningioma Recommend good control of her cardiovascular risk factors Continue to control mood as per PCP    Discussed the use of AI scribe software for clinical note transcription with the patient, who gave verbal consent to proceed.   History of Present Illness    Since May, she has experienced worsening short-term memory, while her  long-term memory remains intact. She has difficulty recalling recent conversations, names, and new information, although these often come back to her after some time, sometimes in the middle of the night. No disorientation or placing items in unusual locations. There are no hallucinations, seizures, or significant changes in sleep patterns, although she experiences vivid dreams occasionally. No hygiene concerns, as she keeps track of her appointments and daily activities using her iPhone.  She has a history of arthritis in her neck, which is currently causing shoulder pain. She is not undergoing physical therapy or neurosurgery at present but works with a trainer twice a week and tries to walk more on her own. She remains active socially, participating in activities like Mahjong, bridge, and attending lectures and shows at [employer].  She lives independently at [facility] and manages her medications with the help of her son, who prepares them for her. She sometimes experiences confusion about whether she has taken her medication, especially when she is not feeling well. Her caregiver notes that her memory issues seem to worsen when she is in pain or not feeling well, but improve when her mood is better.  No recent falls, head injuries, or symptoms of stroke. There are no tremors or signs of Parkinson's disease. She gets up in the middle of the night to urinate but denies any changes in incontinence or bowel dysfunction.     Neuropsych evaluation 08/2023 Briefly, results suggested a primary weakness surrounding delayed retrieval and recognition/consolidation aspects of memory. Further performance variability was exhibited across processing speed and visuospatial abilities. The etiology for ongoing memory dysfunction is  unclear at the present time. Across memory testing, [name] demonstrated an adequate ability to learn novel information initially. However, after brief delays, retention rates ranged from  17% to 55%. While variable, performance across yes/no recognition trials was further poor across 2/3 memory tasks. Taken together, this could suggest very early concerns for rapid forgetting and an evolving storage impairment. This would subsequently raise concern for very early stages of an underlying neurodegenerative illness such as Alzheimer's disease. It is important to highlight that performances across all non-memory areas commonly implicated by the latter illness were appropriate. I cannot rule out the presence of this illness in extremely early stages. However, it remains inappropriate to state the presence of this illness with any degree of confidence. Other potential culprits for cognitive dysfunction would surround bereavement and other depressive symptoms, her myriad of medical conditions and chronic pain, and medication side effects (especially surrounding Ativan , tramadol , and prednisone ).      Initial evaluation 11/08/2022 How long did patient have memory difficulties? [name] , her daughter told her that for the last 6 months she was forgetting things 10 minutes later so now she writes what she needs to remember in a pad. However, she never forgets meds or appt's, but s he may experience some difficulties with recent conversation someone's names. For a few weeks she was not active, trying to adjust to her loss, but recently I just started to read again, watch Netflix.    repeats oneself? Denies  Disoriented when walking into a room?  Patient denies   Leaving objects in unusual places?  I am always leaving my phone everywhere but not in unusual places Ambulates  with difficulty?   She has neuropathy and radiculopathy, so she is a little wobbly, she was on gabapentin , now on Lyrica. She is doing PT and balance therapy and it helps  Recent falls? Recently she had a mechanical fall after tripping over an object and sustaining a head injury with 1 inch laceration above the L eye and  some skin tear on the R knee, no LOC, just a little concussion.  History of seizures?   Patient denies   Wandering behavior?  Patient denies   Patient drives?   Patient no longer drives Any personality changes?  She reports more anxiety, I know he (her husband Seabron)  is not here, but I talk to him good morning because it is comforting  Any history of depression?: She is experiencing grief depression as her husband of 33 years died on 2024-10-02. She is starting therapy [employer]) and will start grief group at [facility] this week.  Hallucinations or paranoia?  Patient denies   She has mild insomnia after her husband death. She falls asleep with the TV on. Denies vivid dreams except one time she dreamt about Seabron it was nice, denies REM behavior or sleepwalking . History of sleep apnea?  Patient denies   Any hygiene concerns?  Patient denies   Independent of bathing and dressing?  Endorsed  Does the patient needs help with medications? Patient is in charge  Who is in charge of the finances?  Patient is in charge   Any changes in appetite?  Patient denies   Patient have trouble swallowing? Patient denies, I live in [facility], I don't have to Does the patient cook? Any kitchen accidents such as leaving the stove on?  Patient denies   Any headaches?  Patient denies   Double vision? Patient denies   Any focal  numbness or tingling? She has chronic paresthesias.  Chronic pain?  She has some R  buttock, RLE pain due to radiculopathy, on LS injections by NS, gabapentin  and tramadol  prn..  She has a history of C spine fusion as well.  Unilateral weakness?  Patient denies   Any tremors?  Patient denies   Any history of anosmia?  Patient denies   Any incontinence of urine?  She reports nocturia  Any bowel dysfunction?   Patient denies    History of heavy alcohol intake?  Patient denies   History of heavy tobacco use?  Patient denies   Family history of dementia?   Negative for  Alzheimer's  disease   Patient lives:  [facility]. L at [facility], her 2 sons live in [place]   Pertinent studies 2 D echo  EF 60-65, mild AI, no sign AS B12 229 (December 2023)    MRI of the brain personally reviewed remarkable for a known calcified meningioma on the posterior left frontal lobe measuring 1.7 x 1.6 cm and change since prior MRI in May 2022.  No parenchymal edema was noted.  There is mild chronic small vessel ischemic changes within the cerebral white matter, slightly progressed from prior MRI, as well as mild to moderate generalized cerebral atrophy, which is stable.    Initial evaluation 11/08/2022 How long did patient have memory difficulties? [name] , her daughter told her that for the last 6 months she was forgetting things 10 minutes later so now she writes what she needs to remember in a pad. However, she never forgets meds or appt's, but s he may experience some difficulties with recent conversation someone's names. For a few weeks she was not active, trying to adjust to her loss, but recently I just started to read again, watch Netflix.    repeats oneself? Denies  Disoriented when walking into a room?  Patient denies   Leaving objects in unusual places?  I am always leaving my phone everywhere but not in unusual places Ambulates  with difficulty?   She has neuropathy and radiculopathy, so she is a little wobbly, she was on gabapentin , now on Lyrica. She is doing PT and balance therapy and it helps  Recent falls? Recently she had a mechanical fall after tripping over an object and sustaining a head injury with 1 inch laceration above the L eye and some skin tear on the R knee, no LOC, just a little concussion.  History of seizures?   Patient denies   Wandering behavior?  Patient denies   Patient drives?   Patient no longer drives Any personality changes?  She reports more anxiety, I know he (her husband Seabron)  is not here, but I talk to him good morning because it is comforting   Any history of depression?: She is experiencing grief depression as her husband of 49 years died on Oct 03, 2024. She is starting therapy [employer]) and will start grief group at [facility] this week.  Hallucinations or paranoia?  Patient denies   She has mild insomnia after her husband death. She falls asleep with the TV on. Denies vivid dreams except one time she dreamt about Seabron it was nice, denies REM behavior or sleepwalking . History of sleep apnea?  Patient denies   Any hygiene concerns?  Patient denies   Independent of bathing and dressing?  Endorsed  Does the patient needs help with medications? Patient is in charge  Who is in charge of the finances?  Patient is in charge   Any changes in appetite?  Patient denies   Patient have trouble swallowing? Patient denies, I live in [facility], I don't have to Does the patient cook? Any kitchen accidents such as leaving the stove on?  Patient denies   Any headaches?  Patient denies   Double vision? Patient denies   Any focal numbness or tingling? She has chronic paresthesias.  Chronic pain?  She has some R  buttock, RLE pain due to radiculopathy, on LS injections by NS, gabapentin  and tramadol  prn..  She has a history of C spine fusion as well.  Unilateral weakness?  Patient denies   Any tremors?  Patient denies   Any history of anosmia?  Patient denies   Any incontinence of urine?  She reports nocturia  Any bowel dysfunction?   Patient denies    History of heavy alcohol intake?  Patient denies   History of heavy tobacco use?  Patient denies   Family history of dementia?   Negative for  Alzheimer's disease   Patient lives:  [facility] at [facility], her 2 sons live in [place]   Pertinent studies 2 D echo  EF 60-65, mild AI, no sign AS B12 229 (December 2023)   MRI brain personally reviewed 10/31/23 remarkable for known,  17 x 16 mm partially calcified meningioma overlying the posterior left frontal lobe, unchanged in size from the brain MRI  of 12/04/2022. As before, the mass indents the underlying brain parenchyma without underlying parenchymal edema. 2. Mild chronic small vessel ischemic changes within the cerebral white matter, similar to the prior MRI. 3. Mild generalized cerebral atrophy. 4. Mild right sphenoid sinusitis.           11/06/2024   12:00 PM 04/16/2024   12:00 PM 02/10/2017    1:39 PM  MMSE - Mini Mental State Exam  Orientation to time 3 4 5    Orientation to Place 5 5 5    Registration 3 3 3    Attention/ Calculation 5 5 5    Recall 2 2 2    Language- name 2 objects 2 2 2    Language- repeat 1 1 1   Language- follow 3 step command 3 3 3    Language- read & follow direction 1 1 1    Write a sentence 1 1 1    Copy design 1 1 1    Total score 27 28 29       Data saved with a previous flowsheet row definition      11/08/2022    7:00 PM  Montreal Cognitive Assessment   Visuospatial/ Executive (0/5) 1  Naming (0/3) 1  Attention: Read list of digits (0/2) 2  Attention: Read list of letters (0/1) 1  Attention: Serial 7 subtraction starting at 100 (0/3) 3  Language: Repeat phrase (0/2) 2  Language : Fluency (0/1) 1  Abstraction (0/2) 1  Delayed Recall (0/5) 3  Orientation (0/6) 6  Total 21  Adjusted Score (based on education) 22      Objective:    Neurological Exam:    VITALS:   Vitals:   11/06/24 1139 11/06/24 1214  BP: 134/65 121/70  Pulse: 72   Resp: 20   SpO2: 96%   Weight: 105 lb (47.6 kg)     GEN:  The patient appears stated age and is in NAD. HEENT:  Normocephalic, atraumatic.   Neurological examination:  General: NAD, well-groomed, appears stated age. Orientation: The patient is alert. Oriented to person, place and date Cranial nerves:  There is good facial symmetry.The speech is fluent and clear. No aphasia or dysarthria. Fund of knowledge is appropriate. Recent and remote memory are impaired. Attention and concentration are reduced. Able to name objects and repeat phrases.  Hearing  is intact to conversational tone.   Sensation: Sensation is intact to light touch throughout Motor: Strength is at least antigravity x4. DTR's 2/4 in UE/LE     Movement examination:  Tone: There is normal tone in the UE/LE Abnormal movements:  no tremor.  No myoclonus.  No asterixis.   Coordination:  There is no decremation with RAM's. Normal FTN Gait and Station: The patient has no difficulty arising out of a deep-seated chair without the use of the hands. The patient's stride length is good.  Gait is cautious and narrow.    Thank you for allowing us  the opportunity to participate in the care of this nice patient. Please do not hesitate to contact us  for any questions or concerns.   Total time spent on today's visit was 35 minutes dedicated to this patient today, preparing to see patient, examining the patient, ordering tests and/or medications and counseling the patient, documenting clinical information in the EHR or other health record, independently interpreting results and communicating results to the patient/family, discussing treatment and goals, answering patient's questions and coordinating care.  Cc:  Plotnikov, Karlynn GAILS, MD  Camie Sevin 11/06/2024 12:43 PM

## 2024-11-06 NOTE — Patient Instructions (Addendum)
 It was a pleasure to see you today at our office.   Recommendations:  Memantine  XR  21 mg at night     Follow up in June 25  at 11:30  Repeat the neurocognitive testing prior to my appointment     Whom to call:  Memory  decline, memory medications: Call our office [phone]   For psychiatric meds, mood meds: Please have your primary care physician manage these medications.    For assessment of decision of mental capacity and competency:  Call Dr. Rosaline Nine, geriatric psychiatrist at [phone]  For guidance in geriatric dementia issues please call [facility] [phone]     If you have any severe symptoms of a stroke, or other severe issues such as confusion,severe chills or fever, etc call 911 or go to the ER as you may need to be evaluated further         RECOMMENDATIONS FOR ALL PATIENTS WITH MEMORY PROBLEMS: 1. Continue to exercise (Recommend 30 minutes of walking everyday, or 3 hours every week) 2. Increase social interactions - continue going to [place] and enjoy social gatherings with friends and family 3. Eat healthy, avoid fried foods and eat more fruits and vegetables 4. Maintain adequate blood pressure, blood sugar, and blood cholesterol level. Reducing the risk of stroke and cardiovascular disease also helps promoting better memory. 5. Avoid stressful situations. Live a simple life and avoid aggravations. Organize your time and prepare for the next day in anticipation. 6. Sleep well, avoid any interruptions of sleep and avoid any distractions in the bedroom that may interfere with adequate sleep quality 7. Avoid sugar, avoid sweets as there is a strong link between excessive sugar intake, diabetes, and cognitive impairment We discussed the Mediterranean diet, which has been shown to help patients reduce the risk of progressive memory disorders and reduces cardiovascular risk. This includes eating fish, eat fruits and green leafy vegetables, nuts  like almonds and hazelnuts, walnuts, and also use olive oil. Avoid fast foods and fried foods as much as possible. Avoid sweets and sugar as sugar use has been linked to worsening of memory function.  There is always a concern of gradual progression of memory problems. If this is the case, then we may need to adjust level of care according to patient needs. Support, both to the patient and caregiver, should then be put into place.     FALL PRECAUTIONS: Be cautious when walking. Scan the area for obstacles that may increase the risk of trips and falls. When getting up in the mornings, sit up at the edge of the bed for a few minutes before getting out of bed. Consider elevating the bed at the head end to avoid drop of blood pressure when getting up. Walk always in a well-lit room (use night lights in the walls). Avoid area rugs or power cords from appliances in the middle of the walkways. Use a walker or a cane if necessary and consider physical therapy for balance exercise. Get your eyesight checked regularly.  FINANCIAL OVERSIGHT: Supervision, especially oversight when making financial decisions or transactions is also recommended.  HOME SAFETY: Consider the safety of the kitchen when operating appliances like stoves, microwave oven, and blender. Consider having supervision and share cooking responsibilities until no longer able to participate in those. Accidents with firearms and other hazards in the house should be identified and addressed as well.   ABILITY TO BE LEFT ALONE: If patient is unable to contact 911 operator, consider using  [facility], or when the need is there, arrange for someone to stay with patients. Smoking is a fire hazard, consider supervision or cessation. Risk of wandering should be assessed by caregiver and if detected at any point, supervision and safe proof recommendations should be instituted.  MEDICATION SUPERVISION: Inability to self-administer medication needs to be  constantly addressed. Implement a mechanism to ensure safe administration of the medications.   DRIVING: Regarding driving, in patients with progressive memory problems, driving will be impaired. We advise to have someone else do the driving if trouble finding directions or if minor accidents are reported. Independent driving assessment is available to determine safety of driving.   If you are interested in the driving assessment, you can contact the following:  [facility] [employer] in [place] [phone]  [facility] [phone]  [facility] [phone] or [phone]    Mediterranean Diet A Mediterranean diet refers to food and lifestyle choices that are based on the traditions of countries located on the [employer]. This way of eating has been shown to help prevent certain conditions and improve outcomes for people who have chronic diseases, like kidney disease and heart disease. What are tips for following this plan? Lifestyle  Cook and eat meals together with your family, when possible. Drink enough fluid to keep your urine clear or pale yellow. Be physically active every day. This includes: Aerobic exercise like running or swimming. Leisure activities like gardening, walking, or housework. Get 7-8 hours of sleep each night. If recommended by your health care provider, drink red wine in moderation. This means 1 glass a day for nonpregnant women and 2 glasses a day for men. A glass of wine equals 5 oz (150 mL). Reading food labels  Check the serving size of packaged foods. For foods such as rice and pasta, the serving size refers to the amount of cooked product, not dry. Check the total fat in packaged foods. Avoid foods that have saturated fat or trans fats. Check the ingredients list for added sugars, such as corn syrup. Shopping  At the grocery store, buy most of your food from the areas near the  walls of the store. This includes: Fresh fruits and vegetables (produce). Grains, beans, nuts, and seeds. Some of these may be available in unpackaged forms or large amounts (in bulk). Fresh seafood. Poultry and eggs. Low-fat dairy products. Buy whole ingredients instead of prepackaged foods. Buy fresh fruits and vegetables in-season from local farmers markets. Buy frozen fruits and vegetables in resealable bags. If you do not have access to quality fresh seafood, buy precooked frozen shrimp or canned fish, such as tuna, salmon, or sardines. Buy small amounts of raw or cooked vegetables, salads, or olives from the deli or salad bar at your store. Stock your pantry so you always have certain foods on hand, such as olive oil, canned tuna, canned tomatoes, rice, pasta, and beans. Cooking  Cook foods with extra-virgin olive oil instead of using butter or other vegetable oils. Have meat as a side dish, and have vegetables or grains as your main dish. This means having meat in small portions or adding small amounts of meat to foods like pasta or stew. Use beans or vegetables instead of meat in common dishes like chili or lasagna. Experiment with different cooking methods. Try roasting or broiling vegetables instead of steaming or sauteing them. Add frozen vegetables to soups, stews, pasta, or rice. Add nuts or seeds for added healthy fat at  each meal. You can add these to yogurt, salads, or vegetable dishes. Marinate fish or vegetables using olive oil, lemon juice, garlic, and fresh herbs. Meal planning  Plan to eat 1 vegetarian meal one day each week. Try to work up to 2 vegetarian meals, if possible. Eat seafood 2 or more times a week. Have healthy snacks readily available, such as: Vegetable sticks with hummus. Greek yogurt. Fruit and nut trail mix. Eat balanced meals throughout the week. This includes: Fruit: 2-3 servings a day Vegetables: 4-5 servings a day Low-fat dairy: 2 servings a  day Fish, poultry, or lean meat: 1 serving a day Beans and legumes: 2 or more servings a week Nuts and seeds: 1-2 servings a day Whole grains: 6-8 servings a day Extra-virgin olive oil: 3-4 servings a day Limit red meat and sweets to only a few servings a month What are my food choices? Mediterranean diet Recommended Grains: Whole-grain pasta. Brown rice. Bulgar wheat. Polenta. Couscous. Whole-wheat bread. [place] Madeira. Vegetables: Artichokes. Beets. Broccoli. Cabbage. Carrots. Eggplant. Green beans. Chard. Kale. Spinach. Onions. Leeks. Peas. Squash. Tomatoes. Peppers. Radishes. Fruits: Apples. Apricots. Avocado. Berries. Bananas. Cherries. Dates. Figs. Grapes. Lemons. Melon. Oranges. Peaches. Plums. Pomegranate. Meats and other protein foods: Beans. Almonds. Sunflower seeds. Pine nuts. Peanuts. Cod. Salmon. Scallops. Shrimp. Tuna. Tilapia. Clams. Oysters. Eggs. Dairy: Low-fat milk. Cheese. Greek yogurt. Beverages: Water. Red wine. Herbal tea. Fats and oils: Extra virgin olive oil. Avocado oil. Grape seed oil. Sweets and desserts: Greek yogurt with honey. Baked apples. Poached pears. Trail mix. Seasoning and other foods: Basil. Cilantro. Coriander. Cumin. Mint. Parsley. Sage. Rosemary. Tarragon. Garlic. Oregano. Thyme. Pepper. Balsalmic vinegar. Tahini. Hummus. Tomato sauce. Olives. Mushrooms. Limit these Grains: Prepackaged pasta or rice dishes. Prepackaged cereal with added sugar. Vegetables: Deep fried potatoes (french fries). Fruits: Fruit canned in syrup. Meats and other protein foods: Beef. Pork. Lamb. Poultry with skin. Hot dogs. [place]. Dairy: Ice cream. Sour cream. Whole milk. Beverages: Juice. Sugar-sweetened soft drinks. Beer. Liquor and spirits. Fats and oils: Butter. Canola oil. Vegetable oil. Beef fat (tallow). Lard. Sweets and desserts: Cookies. Cakes. Pies. Candy. Seasoning and other foods: Mayonnaise. Premade sauces and marinades. The items listed may not be a complete  list. Talk with your dietitian about what dietary choices are right for you. Summary The Mediterranean diet includes both food and lifestyle choices. Eat a variety of fresh fruits and vegetables, beans, nuts, seeds, and whole grains. Limit the amount of red meat and sweets that you eat. Talk with your health care provider about whether it is safe for you to drink red wine in moderation. This means 1 glass a day for nonpregnant women and 2 glasses a day for men. A glass of wine equals 5 oz (150 mL). This information is not intended to replace advice given to you by your health care provider. Make sure you discuss any questions you have with your health care provider. Document Released: 07/08/2016 Document Revised: 09/12/2 Your provider has requested that you have labwork completed today. Please go to [facility] ([phone]) on the second floor of this building before leaving the office today. You do not need to check in. If you are not called within 15 minutes please check with the front desk.  Your Mri will be At [facility]

## 2024-11-07 ENCOUNTER — Telehealth: Payer: Self-pay

## 2024-11-07 NOTE — Telephone Encounter (Signed)
 Copied from CRM #[id number]. Topic: Referral - Status >> Nov 07, 2024  9:20 AM Donna BRAVO wrote: Reason for CRM: patient asking for update on referral  MRI pelvis   Patient request it be done without contrast   Referral # Creation Date Referral Status Status Update  [id number] 10/24/2024 Authorized 11/05/2024: Status History  Provided patient with address and phone number of  [address] [facility]

## 2024-11-09 NOTE — Telephone Encounter (Signed)
 Pelvic MRI ordered on 11/26. Pls see if it is scheduled Thx

## 2024-11-09 NOTE — Telephone Encounter (Signed)
 MRI was ordered on 10/24/2024.

## 2024-11-10 ENCOUNTER — Inpatient Hospital Stay: Admission: RE | Admit: 2024-11-10 | Discharge: 2024-11-10 | Attending: Internal Medicine | Admitting: Internal Medicine

## 2024-11-10 DIAGNOSIS — R102 Pelvic and perineal pain unspecified side: Secondary | ICD-10-CM

## 2024-11-10 DIAGNOSIS — N949 Unspecified condition associated with female genital organs and menstrual cycle: Secondary | ICD-10-CM

## 2024-11-10 DIAGNOSIS — D259 Leiomyoma of uterus, unspecified: Secondary | ICD-10-CM | POA: Diagnosis not present

## 2024-11-10 MED ORDER — GADOPICLENOL 0.5 MMOL/ML IV SOLN
5.0000 mL | Freq: Once | INTRAVENOUS | Status: AC | PRN
  Administered 2024-11-10: 5 mL via INTRAVENOUS

## 2024-11-12 ENCOUNTER — Telehealth: Payer: Self-pay

## 2024-11-12 NOTE — Telephone Encounter (Signed)
 Copied from CRM #[id number]. Topic: Clinical - Lab/Test Results >> Nov 12, 2024  3:50 PM Harlene ORN wrote: Reason for CRM: Patient called to follow up about her pelvic MRI done in 11/10/2024. Please call back the patient when the results are in to discuss with her directly.

## 2024-11-13 ENCOUNTER — Ambulatory Visit: Payer: Self-pay | Admitting: Internal Medicine

## 2024-11-13 ENCOUNTER — Ambulatory Visit: Admitting: Obstetrics and Gynecology

## 2024-11-13 NOTE — Telephone Encounter (Signed)
 At time message was received, results had not been reviewed. Since then patient has since received and viewed results via MyChart

## 2024-11-14 ENCOUNTER — Ambulatory Visit: Admitting: Psychology

## 2024-11-14 ENCOUNTER — Ambulatory Visit

## 2024-11-14 ENCOUNTER — Encounter: Payer: Self-pay | Admitting: Psychology

## 2024-11-14 DIAGNOSIS — R4189 Other symptoms and signs involving cognitive functions and awareness: Secondary | ICD-10-CM

## 2024-11-14 DIAGNOSIS — F0670 Mild neurocognitive disorder due to known physiological condition without behavioral disturbance: Secondary | ICD-10-CM

## 2024-11-14 NOTE — Progress Notes (Signed)
° °  Psychometrician Note   Cognitive testing was administered to [name] by [name], B.S. (psychometrist) under the supervision of Dr. Arthea KYM Maryland, Ph.D., ABPP, licensed psychologist on 11/14/2024. [name] did not appear overtly distressed by the testing session per behavioral observation or responses across self-report questionnaires. Rest breaks were offered.   The battery of tests administered was selected by Dr. Zachary C. Merz, Ph.D., ABPP with consideration to [name]'s current level of functioning, the nature of her symptoms, emotional and behavioral responses during interview, level of literacy, observed level of motivation/effort, and the nature of the referral question. This battery was communicated to the psychometrist. Communication between Dr. Arthea KYM Maryland, Ph.D., ABPP and the psychometrist was ongoing throughout the evaluation and Dr. Arthea KYM Maryland, Ph.D., ABPP was immediately accessible at all times. Dr. Zachary C. Merz, Ph.D., ABPP provided supervision to the psychometrist on the date of this service to the extent necessary to assure the quality of all services provided.    [name] will return within approximately 1-2 weeks for an interactive feedback session with Dr. Maryland at which time her test performances, clinical impressions, and treatment recommendations will be reviewed in detail. [name] understands she can contact our office should she require our assistance before this time.  A total of 127 minutes of billable time were spent face-to-face with [name] by the psychometrist. This includes both test administration and scoring time. Billing for these services is reflected in the clinical report generated by Dr. Arthea KYM Maryland, Ph.D., ABPP  This note reflects time spent with the psychometrician and does not include test scores or any clinical interpretations made by Dr. Maryland. The full report will follow in a separate note.

## 2024-11-14 NOTE — Progress Notes (Unsigned)
 NEUROPSYCHOLOGICAL EVALUATION [facility]. [facility] Department of Neurology  Date of Evaluation: November 14, 2024  Reason for Referral:   [name] is a 88 y.o. right-handed Caucasian female referred by Camie Sevin, PA-C, to characterize her current cognitive functioning and assist with diagnostic clarity and treatment planning in the context of subjective cognitive decline.   Assessment and Plan:   Clinical Impression(s): [name] pattern of performance is suggestive of a primary deficit surrounding delayed retrieval aspects of memory. Variability was further noted across recognition/consolidation aspects of memory. Isolated impairments were exhibited across a rapid visuomotor sequencing task and clock drawing; however, performance across all other tasks assessing processing speed and visuospatial abilities were appropriate respectively. Performances were also appropriate relative to age-matched peers across basic attention, cognitive flexibility, receptive and expressive language, and encoding (i.e., learning) aspects of memory. Functionally, [name] denied all concerns. However, medical records suggest that she does have assistance surrounding medication and financial management. Given somewhat benign testing patterns, I do not believe that current scores warrant a formal dementia diagnosis. As such, she continues to best meet diagnostic criteria for a Mild Neurocognitive Disorder (mild cognitive impairment) in my opinion.   Relative to her previous evaluation in October 2024, mild decline was exhibited across delayed retrieval aspects of memory. This can be primarily seen across retention rates. Where [name] exhibited retention rates of 17% across a list learning task and 35% across a figure drawing task in 2024, these rates were currently 0% and 12% respectively. She also had greater difficulty drawing a clock presently. Outside of this, performance across all  other assessed domains were stable over time.  The cause for memory dysfunction continues to be unclear presently. Given diminished retention rates across memory testing and some further weakness across recognition trials, there is some concern surrounding rapid forgetting and an evolving storage impairment. This, in turn, could elevate concerns for an underlying neurodegenerative illness. However, delayed retention of previously learned story-based content remained strong (70% presently). Other non-memory areas additionally saw no appreciable decline over the past 14 months. As stated in 2024, while the earlier stages of a neurodegenerative illness cannot be ruled out, one cannot be ruled in with confidence based on testing patterns alone. Continued medical monitoring will be important moving forward.  Recommendations: Ms. Beahm has already been prescribed a medication aimed to address memory loss and concerns surrounding a neurodegenerative illness (i.e., memantine /Namenda ). She is encouraged to continue taking this medication as prescribed. It is important to highlight that this medication has been shown to slow functional decline in some individuals. There is no current treatment which can stop or reverse cognitive decline when caused by a neurodegenerative illness.   Performance across neurocognitive testing is not a strong predictor of an individual's safety operating a motor vehicle. Should her family wish to pursue a formalized driving evaluation, they could reach out to the following agencies: [employer] in [place]: [phone] [facility]: [phone] [facility]: [phone] [facility]: [phone] or [phone]  Should there be progression of current deficits over time, [name] is unlikely to regain any independent living skills lost. Therefore, it is recommended that she remain as involved as possible in all aspects of household  chores, finances, and medication management, with supervision to ensure adequate performance. She will likely benefit from the establishment and maintenance of a routine in order to maximize her functional abilities over time.  It will be important for [name] to have  another person with her when in situations where she may need to process information, weigh the pros and cons of different options, and make decisions, in order to ensure that she fully understands and recalls all information to be considered.  If not already done, [name] and her family may want to discuss her wishes regarding durable power of attorney and medical decision making, so that she can have input into these choices. If they require legal assistance with this, long-term care resource access, or other aspects of estate planning, they could reach out to [employer] at [phone] for a free consultation.  [name] is encouraged to attend to lifestyle factors for brain health (e.g., regular physical exercise, good nutrition habits and consideration of the MIND-DASH diet, regular participation in cognitively-stimulating activities, and general stress management techniques), which are likely to have benefits for both emotional adjustment and cognition. Optimal control of vascular risk factors (including safe cardiovascular exercise and adherence to dietary recommendations) is encouraged. Continued participation in activities which provide mental stimulation and social interaction is also recommended.   Important information should be provided to [name] in written format in all instances. This information should be placed in a highly frequented and easily visible location within her home to promote recall. External strategies such as written notes in a consistently used memory journal, visual and nonverbal auditory cues such as a calendar on the refrigerator or appointments with alarm, such as on a cell phone, can also help  maximize recall.  Because she shows better recall for structured information, she may understand and retain new information better if it is presented to her in a meaningful or well-organized manner at the outset, such as grouping items into meaningful categories or presenting information in an outlined, bulleted, or story format.  Review of Records:   Ms. Hofbauer completed a comprehensive neuropsychological evaluation with myself on 09/15/2023. Results suggested a primary weakness surrounding delayed retrieval and recognition/consolidation aspects of memory. Further performance variability was exhibited across processing speed and visuospatial abilities. The cause was unclear and repeat testing was recommended. She was ultimately diagnosed with a mild neurocognitive disorder.  Past Medical History:  Diagnosis Date   Acute pain of right shoulder 11/28/2023   Voltaren  gel  X ray R shoulder  Medrol  pack  OK CBD use     Allergic rhinitis due to animal (cat) (dog) hair and dander 08/07/2021   Allergic rhinitis due to pollen 08/07/2021   Aortic valve sclerosis 03/12/2015   Atrophic gastritis 09/03/2008   Atrophic vaginitis    Baker's cyst    Left-Dr. Aluisio   Bladder pain 08/19/2014   Chronic  Take Vesicare  10 mg/d  UA was ok  On Premarin cream PV per GYN  On Vesicare   F/u w/Dr JONELLE Moats     Cataract    Dr. Octavia   Cerumen impaction 07/16/2013   8/14  08/2023  I tried to remove cerumen impaction out of the right ear with a plastic loop.  It was painful and we stopped.  No bleeding  Cortisporin eardrops prescribed just in case.  Use Debrox wax removing At home  She ENT if needed     Cervical spinal cord compression 05/04/2021   Cervical spine instability 05/15/2021   Dr. Unice: plan -  posterior C1-2 decompression and instrumented fusion.  MRI: Progressive soft tissue pannus at C1-2 is now creating mass  effect on the craniocervical junction with distortion of the upper  spinal cord. This is likely  related rheumatoid arthritis  C spine CT: Fracture of the anterior arch of C1 with 12 mm separation. Nondisplaced fracture posterior arch of C1. There is later   Cervical spondylosis 01/05/2019   Chalazion left upper eyelid 04/18/2023   X2  Keflex  po - too big... Will use Ceftin   F/u w/Ophthalmology   CHF (congestive heart failure)    Cholelithiasis 09/09/2019   Per Dr Abran: At this point we mutually decided to watch her abdominal complaints.  If symptoms accelerate or become more classic for symptomatic cholelithiasis, then she is agreeable to surgical referral.  She will keep me posted   Chronic left SI joint pain 11/30/2022   Closed fracture of first cervical vertebra 08/07/2021   Closed fracture of second cervical vertebra 08/07/2021   Concussion 10/29/2021   s/p a hard fall after she rushed and slipped on the wet marble floor while visiting family in [place]  a couple week ago.  She hit the side of her skull, had a skin laceration bled quite a bit.  She was dazed and according to her son [name] had a loss of consciousness of under a minute duration.  She felt dazed.  She was taken to ER.  2 staples were applied to her skin laceration on the left o   Constipation 11/11/2016   Chronic, multifactorial  Miralax  prn  7/22 Postop constipation related to oxycodone  and Robaxin .  We can discontinue oxycodone  Robaxin .  The patient will use tramadol  and Tylenol  instead.  Use MiraLAX  or Senokot as to produce soft regular stools.  Discontinue Pepto-Bismol.  I think this should help with abdominal bloating complaint.      Take Metamucil daily  Senokot as to produce soft regular sto   Contact dermatitis and eczema 06/14/2018   Eczema vs other - 7/19  Cortaid prn  Depo-medrol  IM 80 mg  Remove nail polish     Contusion of left chest wall 01/09/2015   Johnston fell and broke a rib #8 on the L last week, she had a rib X ray and a CT (Dr Jane).   Contusion of right knee 08/12/2016   R knee   Degenerative disc  disease, cervical 03/11/2021   Duodenitis 09/03/2008   Essential hypertension, benign 07/27/2011   Chronic, mild  NAS diet  Cardiac CT scan for calcium  scoring offered 1/20  1/24 HTN and s/p ER visit on 12/08/22 for SBP 180. Per Dr Randol:  Discussed with the patient that her restarting donepezil  could have impacted her symptoms today.  However, her blood pressure continues to be mildly elevated.  Will place her on 2.5 mg amlodipine  to take if her blood pressure is over 170/100.  Nursing aide at   Fatigue 09/08/2014   10/15, 2/16, 9/17, 2/18  No other sx  Zamiya is managing it with [name] rest periods 1-2/d     Fibroid    Gait disorder 02/28/2022   Worse  Multifactorial  Cont w/PT  In the balance class  Balance class q 1 week     [name] resolved after doing exercises by Dr Harvey 11/2021     Generalized anxiety disorder 08/22/2018   Chronic   Lexapro  - not taking  Weighted blanket  Lorazepam  prn, - tolerance has developed   Potential benefits of a long term benzodiazepines  use as well as potential risks  and complications were explained to the patient and were aknowledged.     GERD (gastroesophageal reflux disease) 11/20/2007   Chronic. Better on Gluten free diet  Dexilant    Potential benefits of a long term PPI  use as well as potential risks  and complications were explained to the patient and were aknowledged.  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     History of basal cell carcinoma 09/29/2022   History of colonic polyps 09/04/2008   Hyperlipemia, mixed 03/15/2011   Chronic  On Simvastatin      Hypotension 07/13/2023   Stop Amlodipine      Hypothyroidism    Impacted cerumen of right ear 07/16/2013   Increased frequency of urination 08/19/2014   Inflammation of sacroiliac joint 11/16/2022   Insomnia 01/24/2013    Lorazepam  prn   Potential benefits of a long term benzodiazepines  use as well as potential risks  and complications were explained to the patient and were aknowledged.        Irritable bowel syndrome 09/04/2008   Chronic   Abran MD, Norleen SAILOR  2013 - much better on gluten free diet              Lactose intolerance 11/20/2007   Lactaid        Lumbar degenerative disc disease 12/21/2022   Based on MRI and her exam there is significant DDD in lumbar spine     Lumbar spondylosis    Melanocytic nevi of trunk 02/22/2022   Meningioma 03/25/2020   Dr Unice     Mild cognitive impairment with memory loss 09/15/2023   Mild intermittent asthma 08/07/2021   Multiple thyroid  nodules 10/04/2011   2013 she was started on Synthroid  - tol well  2017, 2022 US  OK     MVP (mitral valve prolapse)    Antibiotics required for dental procedures   Neck pain 12/26/2018   Chronic  PT, traction, TENs unit was offered  Tramadol  prn   Potential benefits of a long term opioids use as well as potential risks (i.e. addiction risk, apnea etc) and complications (i.e. Somnolence, constipation and others) were explained to the patient and were aknowledged.        OAB (overactive bladder) 09/02/2010   Dr Nicholaus  Use Solifenacin  prn  Worse - ?multifactorial  UA was normal  Camie increased Memantine  to 10 mg bid a month ago     Osteoarthritis 02/25/2008   Dr Bonner  Blue-Emu cream was recommended to use 2-3 times a day        Osteoporosis 11/20/2007   Chronic  Per GYN        Overactive bladder    Pain of right sacroiliac joint 01/14/2023   Postherpetic neuralgia 10/02/2013   Pruritus of skin 03/11/2012   S/p H zoster vaccination  Relapsing  8/12 - on face  8/14 - R ear/face  10/14 ?     Sacroiliitis    Seborrheic dermatitis 02/22/2022   Sinusitis, chronic 08/06/2016   Worse  Flonase , Atrovent  nasal, Singulair , Claritin   ENT ref Dr Thaddeus  CT sinuses     Skin ulcer of nose 07/13/2023   Soft pads on the nose  Derm appt      Spondylolisthesis, cervical region 03/11/2021   Stool incontinence 11/16/2022   12/23  Worse.  Stool leakage - new  D/c Aricept   Abd X ray  to rule out constipation     Tibialis tendinitis 03/20/2008   Torn meniscus    bilateral   Trochanteric bursitis of right hip 09/22/2020   Upper respiratory infection 08/03/2012   9/13 - 3 wks  2/17  11/18 - asthmatic bronchitis     Urinary urgency 08/19/2014   2023 worse.  Obtain urinalysis.  D/c Aricept   UA was ok  On Premarin cream PV per GYN  On Vesicare   F/u w/Dr JONELLE Moats     Urticaria 02/22/2022    Past Surgical History:  Procedure Laterality Date   CATARACT EXTRACTION, BILATERAL     EYE SURGERY     POSTERIOR CERVICAL FUSION/FORAMINOTOMY N/A 05/15/2021   Procedure: Cervical One Laminectomy with resection of cyst, Fixation from Occiput to Cervical Four;  Surgeon: Unice Pac, MD;  Location: [facility] OR;  Service: Neurosurgery;  Laterality: N/A;   TONSILLECTOMY      Current Outpatient Medications:    acetaminophen  (TYLENOL ) 500 MG tablet, Take 1,000 mg by mouth every 6 (six) hours as needed for mild pain., Disp: , Rfl:    albuterol  (VENTOLIN  HFA) 108 (90 Base) MCG/ACT inhaler, Take 2 puffs every 4-6 hours as needed, Disp: , Rfl:    amLODipine  (NORVASC ) 2.5 MG tablet, Take 2.5 mg by mouth as needed., Disp: , Rfl:    Ascorbic Acid (VITAMIN C ) 100 MG tablet, Take 1 tablet (100 mg total) by mouth daily., Disp: 100 tablet, Rfl: 1   azelastine  (ASTELIN ) 0.1 % nasal spray, 1 spray in each nostril Nasally Once a day in the evening, Disp: , Rfl:    Calcium  Carbonate-Vitamin D  (CALCIUM  CARBONATE W/VITAMIN D  PO), Take 1 tablet by mouth daily., Disp: , Rfl:    cyanocobalamin  (VITAMIN B12) 1000 MCG tablet, Take 1 tablet (1,000 mcg total) by mouth daily., Disp: 100 tablet, Rfl: 3   desonide  (DESOWEN ) 0.05 % cream, Apply 1 application topically as needed., Disp: , Rfl:    EPINEPHrine  (EPIPEN  2-PAK) 0.3 mg/0.3 mL IJ SOAJ injection, Inject 0.3 mLs (0.3 mg total) into  the muscle as needed., Disp: 1 Device, Rfl: 1   erythromycin  ophthalmic ointment, Place 1 Application into both eyes as needed., Disp: , Rfl:    estradiol  (ESTRACE ) 0.01 % CREA vaginal cream, Use 1 gram vaginally 3 times a week at bedtime (Patient not taking: Reported on 10/31/2024), Disp: 42.5 g, Rfl: 12   famotidine  (PEPCID ) 40 MG tablet, Take 40 mg by mouth as needed for heartburn or indigestion., Disp: , Rfl:    fluticasone  (FLONASE ) 50 MCG/ACT nasal spray, Place 2 sprays into both nostrils daily., Disp: 16 g, Rfl: 6   guaiFENesin -dextromethorphan (ROBITUSSIN DM) 100-10 MG/5ML syrup, Take 5-10 mLs by mouth every 4 (four) hours as needed for cough (chest congestion)., Disp: 240 mL, Rfl: 0   hydrocortisone  (ANUSOL -HC) 2.5 % rectal cream, Place 1 Application rectally as needed for hemorrhoids or anal itching., Disp: , Rfl:    ipratropium (ATROVENT ) 0.06 % nasal spray, Place 2 sprays into both nostrils 2 (two) times daily as needed for rhinitis., Disp: , Rfl:    Lactase (LACTAID PO), Take by mouth., Disp: , Rfl:    levocetirizine (XYZAL ) 5 MG tablet, Take 1 tablet (5 mg total) by mouth daily., Disp: 90 tablet, Rfl: 3   levothyroxine  (SYNTHROID ) 50 MCG tablet, TAKE ONE TABLET BY MOUTH DAILY BEFORE BREAKFAST, Disp: 30 tablet, Rfl: 5   LORazepam  (ATIVAN ) 1 MG tablet, Take 2-3 tablets at bedtime and 1-2 tablets in the daytime as needed., Disp:  150 tablet, Rfl: 2   Magnesium  Sulfate (EPSOM SALT) POWD, UAD for warm water soaks (Patient not taking: Reported on 10/31/2024), Disp: 500 g, Rfl: 0   MegaRed Omega-3 Krill Oil 500 MG CAPS, Take 1 capsule by mouth every morning. (Patient not taking: Reported on 10/31/2024), Disp: 100 capsule, Rfl: 3   memantine  (NAMENDA  XR) 21 MG CP24 24 hr capsule, Take 1 capsule (21 mg total) by mouth at bedtime., Disp: 90 capsule, Rfl: 3   MYRBETRIQ  50 MG TB24 tablet, SMARTSIG:1 Tablet(s) By Mouth, Disp: , Rfl:    NONFORMULARY OR COMPOUNDED ITEM, Gabapentin  6% cream in neutral  base, apply in a thin layer to vulva once a day in the morning as needed.  Disp:  30 grams, RF one.  [employer]., Disp: 30 each, Rfl: 1   pantoprazole  (PROTONIX ) 40 MG tablet, TAKE ONE TABLET BY MOUTH TWICE DAILY BEFORE MEALS (Patient not taking: Reported on 10/31/2024), Disp: 60 tablet, Rfl: 5   polyethylene glycol powder (GLYCOLAX /MIRALAX ) 17 GM/SCOOP powder, Take 17 g by mouth 2 (two) times daily as needed for moderate constipation., Disp: 500 g, Rfl: 3   psyllium (METAMUCIL) 58.6 % packet, Take 1 packet by mouth as needed. (Patient not taking: Reported on 10/31/2024), Disp: , Rfl:    triamcinolone  ointment (KENALOG ) 0.1 %, Apply 1 Application topically 3 (three) times daily. As needed for itching (Patient not taking: Reported on 10/31/2024), Disp: 80 g, Rfl: 2   UNABLE TO FIND, Med Name: CBD oil - under tongue, Disp: , Rfl:      11/06/2024   12:00 PM 04/16/2024   12:00 PM 02/10/2017    1:39 PM  MMSE - Mini Mental State Exam  Orientation to time 3 4 5    Orientation to Place 5 5 5    Registration 3 3 3    Attention/ Calculation 5 5 5    Recall 2 2 2    Language- name 2 objects 2 2 2    Language- repeat 1 1 1   Language- follow 3 step command 3 3 3    Language- read & follow direction 1 1 1    Write a sentence 1 1 1    Copy design 1 1 1    Total score 27 28 29        11/08/2022    7:00 PM  Montreal Cognitive Assessment   Visuospatial/ Executive (0/5) 1  Naming (0/3) 1  Attention: Read list of digits (0/2) 2  Attention: Read list of letters (0/1) 1  Attention: Serial 7 subtraction starting at 100 (0/3) 3  Language: Repeat phrase (0/2) 2  Language : Fluency (0/1) 1  Abstraction (0/2) 1  Delayed Recall (0/5) 3  Orientation (0/6) 6  Total 21  Adjusted Score (based on education) 22   Neuroimaging: Brain MRI on 07/11/2019 revealed a 16 mm calcified meningioma in the left frontal convexity without significant brain edema. No age advanced atrophy or microvascular ischemic disease was  noted. Brain MRI on 03/06/2020 was stable. Brain MRI on 04/25/2021 suggested mild atrophy and mild microvascular ischemic disease. Her meningioma was said to be stable. Brain MRI on 12/04/2022 suggested a chronic punctate microhemorrhage in the right frontoparietal white matter but was otherwise stable. Brain MRI on 10/31/2023 was stable.  Clinical Interview:   The following information was obtained during a clinical interview with [name] and her caretaker [name] prior to cognitive testing.  Cognitive Symptoms: Decreased [name]-term memory: Endorsed. [name] acknowledged generalized [name]-term memory dysfunction. She was vague when providing examples, noting some trouble  recalling names. She generally denied trouble misplacing things or recalling details of conversations. [name] noted that she and family have observed more pronounced memory dysfunction, with concerns largely surrounding rapid forgetting. [name] noted that [name] has recently been calling the pharmacy to inquire about a medication, and then calling back shortly after asking the same questions due to forgetfulness.  Decreased long-term memory: Denied. Decreased attention/concentration: Denied. Reduced processing speed: Endorsed a little bit. Difficulties with executive functions: Denied. Difficulties with emotion regulation: Denied. [name] alluded to [name] verbally lashing out more often than what is typical. No other prominent personality changes were reported.  Difficulties with receptive language: Denied. Difficulties with word finding: Denied. Decreased visuoperceptual ability: Denied.  Trajectory of deficits: [name] denied all concerns during her previous October 2024 evaluation. Since that time, she did acknowledge concerns for mildly progressive memory loss. In October 2024, family had noted concern for memory decline for the past year or so. [name] noted her and family's perception that memory decline has  continued to worsen over time.   Difficulties completing ADLs: [name] continues to live in an independent living capacity at [facility]. She denied difficulties with daily functioning. However, medical records suggest that [name] does experience confusion surrounding her medications and can have poor adherence. Her son prepares her medications for her. He has also taken over financial management. [name] no longer drives.  Additional Medical History: History of traumatic brain injury/concussion: She appeared unclear if she had ever been diagnosed with a concussion. Medical records suggest that she slipped and fell in November 2022 resulting in a brief loss in consciousness under one minute in length and a skin laceration which was repaired at a local ED. No more recent falls were described. History of stroke: Denied. History of seizure activity: Denied. History of known exposure to toxins: Denied. Symptoms of chronic pain: Denied. Experience of frequent headaches/migraines: Denied. Frequent instances of dizziness/vertigo: Denied.   Sensory changes: She wears glasses with benefit. Other sensory changes/difficulties (e.g., hearing, taste, smell) were not reported.  Balance/coordination difficulties: She reported some instability, largely attributed to bilateral hip pain. She ambulates with a cane or walker while outside of her home primarily as a precaution to prevent future falls. She continues to work with an unspecified staff member at [facility] on improving her strength and balance several times per week. This was described as helpful.  Other motor difficulties: Denied.  Sleep History: Estimated hours obtained each night: 8-9 hours.  Difficulties falling asleep: Denied. Difficulties staying asleep: Endorsed. She reported waking to use the restroom several times per night but is generally able to fall back asleep quickly.  Feels rested and refreshed upon awakening: Endorsed.    History of snoring: Denied. History of waking up gasping for air: Denied. Witnessed breath cessation while asleep: Denied.   History of vivid dreaming: Denied. Excessive movement while asleep: Denied. Instances of acting out her dreams: Denied.  Psychiatric/Behavioral Health History: Depression: She described her current mood as okay, attributed to her feeling isolated from her family who live in other parts of the country. Her husband also passed in October 2023. Prior to her husband's passing, she did not describe prominent psychiatric concerns or formal diagnoses. Current or remote suicidal ideation, intent, or plan was denied.  Anxiety: Denied. Mania: Denied. Trauma History: Denied. Visual/auditory hallucinations: Denied. Delusional thoughts: Denied.   Tobacco: Denied. Alcohol: She reported rare alcohol consumption in social settings and denied a history of problematic alcohol abuse or  dependence.  Recreational drugs: Denied.  Family History: Problem Relation Age of Onset   Heart disease Mother    Hypertension Mother    Osteoporosis Mother    Congestive Heart Failure Mother    Pancreatic cancer Father    Heart attack Brother    Drug abuse Brother        over dose    Healthy Son    Healthy Son    Colon cancer Neg Hx    Colon polyps Neg Hx    Rectal cancer Neg Hx    Stomach cancer Neg Hx    Breast cancer Neg Hx    This information was confirmed by Ms. Drumwright.  Academic/Vocational History: Highest level of educational attainment: 15 years. She graduated from high school and completed a three year business college program in [place] . She described herself as a good (A/B) student in academic settings. No relative weaknesses were identified.  History of developmental delay: Denied. History of grade repetition: Denied. Enrollment in special education courses: Denied. History of LD/ADHD: Denied.   Employment: Retired. 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   Evaluation Results:   Behavioral Observations: [name] was accompanied by her caretaker [name], arrived to her appointment on time, and was appropriately dressed and groomed. She appeared alert. She ambulated with a cane for precautionary purposes. Observed gait and station were within normal limits. Gross motor functioning appeared intact upon informal observation and no abnormal movements (e.g., tremors) were noted. Her affect was generally relaxed and positive. Spontaneous speech was fluent and word finding difficulties were not observed during the clinical interview. Thought processes were coherent, organized, and normal in content. Insight into her cognitive difficulties appeared adequate.   During testing, sustained attention was appropriate. Task engagement was adequate and she persisted when challenged. She fatigued as the evaluation progressed. It was abbreviated in response. Overall, [name] was cooperative with the clinical interview and subsequent testing procedures.   Adequacy of Effort: The validity of neuropsychological testing is limited by the extent to which the individual being tested may be assumed to have exerted adequate effort during testing. Ms. Dobesh expressed her intention to perform to the best of her abilities and exhibited adequate task engagement and persistence. Scores across stand-alone and embedded performance validity measures were within expectation. As such, the results of the current evaluation are believed to be a valid representation of Ms. Presswood's current cognitive functioning.  Test Results: Ms. Whitelock was oriented at the time of the current evaluation. She was two days off when stating the current date.  Intellectual abilities based upon educational and vocational attainment were estimated to be in the average range. Premorbid abilities were estimated to be within the average range based upon  a single-word reading test.   Processing speed was average outside of an isolated impairment across a visuomotor sequencing task (TMT A). Basic attention was above average. More complex attention (e.g., working memory) was unable to be assessed. Cognitive flexibility was average. Other aspects of executive functioning were unable to be assessed.  Receptive language abilities were unable to be assessed. Ms. Lacour did not exhibit pronounced difficulties comprehending task instructions and answered all questions asked of her appropriately. Assessed expressive language (e.g., verbal fluency and confrontation naming) was mildly variable but overall appropriate, ranging from the below average to above average normative ranges.     Assessed visuospatial/visuoconstructional abilities were below average to average outside of an  impairment when drawing a clock. When drawing her clock, the numbers were placed in a counter-clockwise fashion. She also incorrectly placed the clock hands.    Learning (i.e., encoding) of novel verbal information was average. Spontaneous delayed recall (i.e., retrieval) of previously learned information was average across a story-based task but exceptionally low across list and figure-based tasks. Retention rates were 0% across a list learning task, 70% across a story learning task, and 12% across a figure drawing task. Performance across recognition tasks was variable, ranging from the well below average to average normative ranges, suggesting some evidence for information consolidation.   Results of emotional screening instruments suggested that recent symptoms of generalized anxiety were in the minimal range, while symptoms of depression were within normal limits. A screening instrument assessing recent sleep quality suggested the presence of minimal sleep dysfunction.  Table of Scores:   Note: This summary of test scores accompanies the interpretive report and should not be  considered in isolation without reference to the appropriate sections in the text. Descriptors are based on appropriate normative data and may be adjusted based on clinical judgment. Terms such as Within Normal Limits and Outside Normal Limits are used when a more specific description of the test score cannot be determined. Descriptors refer to the current evaluation only.        Percentile - Normative Descriptor > 98 - Exceptionally High 91-97 - Well Above Average 75-90 - Above Average 25-74 - Average 9-24 - Below Average 2-8 - Well Below Average < 2 - Exceptionally Low        Validity: October 2024 Current  DESCRIPTOR        DCT: --- --- --- Within Normal Limits  RBANS EI: --- --- --- Within Normal Limits        Orientation:       Raw Score Raw Score Percentile   NAB Orientation, Form 1 29/29 27/29 --- ---        Cognitive Screening:       Raw Score Raw Score Percentile   SLUMS: 20/30 20/30 --- ---        RBANS, Form A: Standard Score/ Scaled Score Standard Score/ Scaled Score Percentile   Total Score 90 89 23 Below Average  Immediate Memory 90 97 42 Average    List Learning 7 9 37 Average    Story Memory 10 10 50 Average  Visuospatial/Constructional 87 89 23 Below Average    Figure Copy 10 10 50 Average    Line Orientation 12/20 13/20 10-16 Below Average  Language 103 110 75 Above Average    Picture Naming 9/10 9/10 26-50 Average    Semantic Fluency 10 12 75 Above Average  Attention 115 103 58 Average    Digit Span 17 12 75 Above Average    Coding 8 9 37 Average  Delayed Memory 71 64 1 Exceptionally Low    List Recall 1/10 0/10 <2 Exceptionally Low    List Recognition 16/20 16/20 3-9 Well Below Average    Story Recall 8 9 37 Average    Story Recognition 10/12 9/12 29-52 Average    Figure Recall 6 3 1  Exceptionally Low    Figure Recognition 3/8 4/8 21-38 Below Average to Average         Intellectual Functioning:       Standard Score Standard Score Percentile    Test of Premorbid Functioning: 107 99 47 Average        Attention/Executive Function:  Trail Making Test (TMT): Raw Score (T Score) Raw Score (T Score) Percentile     Part A 66 secs.,  1 error (34) 71 secs.,  0 errors (29) 2 Exceptionally Low    Part B 121 secs.,  0 errors (48) 90 secs.,  0 errors (52) 58 Average          Scaled Score Scaled Score Percentile   WAIS-5 Coding: --- 10 50 Average  WAIS-5 Naming Speed Quantity: --- 11 63 Average        Language:      Verbal Fluency Test: Raw Score (Scaled Score) Raw Score (Scaled Score) Percentile     Phonemic Fluency (CFL) 28 (9) 30 (10) 50 Average    Category Fluency 40 (12) 42 (12) 75 Above Average  *Based on [employer] Older Normative Studies (MOANS)            NAB Language Module, Form 1: T Score T Score Percentile     Naming 25/31 (37) 27/31 (42) 21 Below Average        Visuospatial/Visuoconstruction:       Raw Score Raw Score Percentile   Clock Drawing: 9/10 6/10 --- Impaired        Mood and Personality:       Raw Score Raw Score Percentile   Geriatric Depression Scale: 7 5 --- Within Normal Limits  Geriatric Anxiety Scale: 10 2 --- Minimal    Somatic 4 1 --- Minimal    Cognitive 3 1 --- Minimal    Affective 3 0 --- Minimal        Additional Questionnaires:       Raw Score Raw Score Percentile   PROMIS Sleep Disturbance Questionnaire: 14 15 --- None to Slight   Informed Consent and Coding/Compliance:   The current evaluation represents a clinical evaluation for the purposes previously outlined by the referral source and is in no way reflective of a forensic evaluation.   Ms. Myer was provided with a verbal description of the nature and purpose of the present neuropsychological evaluation. Also reviewed were the foreseeable risks and/or discomforts and benefits of the procedure, limits of confidentiality, and mandatory reporting requirements of this provider. The patient was given the opportunity to ask questions and  receive answers about the evaluation. Oral consent to participate was provided by the patient.   This evaluation was conducted by Arthea KYM Maryland, Ph.D., ABPP-CN, board certified clinical neuropsychologist. Ms. Rundell completed a clinical interview with Dr. Maryland, billed as one unit [phone], and 127 minutes of cognitive testing and scoring, billed as one unit [phone] and three additional units [id number]. Psychometrist Evalene Pizza, B.S. assisted Dr. Maryland with test administration and scoring procedures. As a separate and discrete service, one unit [phone] and two units 96133 (171 minutes) were billed for Dr. Loralee time spent in interpretation and report writing.

## 2024-11-15 ENCOUNTER — Ambulatory Visit: Admitting: Rheumatology

## 2024-11-15 DIAGNOSIS — Z8639 Personal history of other endocrine, nutritional and metabolic disease: Secondary | ICD-10-CM

## 2024-11-15 DIAGNOSIS — M810 Age-related osteoporosis without current pathological fracture: Secondary | ICD-10-CM

## 2024-11-15 DIAGNOSIS — M62830 Muscle spasm of back: Secondary | ICD-10-CM

## 2024-11-15 DIAGNOSIS — M51369 Other intervertebral disc degeneration, lumbar region without mention of lumbar back pain or lower extremity pain: Secondary | ICD-10-CM

## 2024-11-15 DIAGNOSIS — M5030 Other cervical disc degeneration, unspecified cervical region: Secondary | ICD-10-CM

## 2024-11-15 DIAGNOSIS — M19071 Primary osteoarthritis, right ankle and foot: Secondary | ICD-10-CM

## 2024-11-15 DIAGNOSIS — E034 Atrophy of thyroid (acquired): Secondary | ICD-10-CM

## 2024-11-15 DIAGNOSIS — R2681 Unsteadiness on feet: Secondary | ICD-10-CM

## 2024-11-15 DIAGNOSIS — G8929 Other chronic pain: Secondary | ICD-10-CM

## 2024-11-15 DIAGNOSIS — I10 Essential (primary) hypertension: Secondary | ICD-10-CM

## 2024-11-15 DIAGNOSIS — G5703 Lesion of sciatic nerve, bilateral lower limbs: Secondary | ICD-10-CM

## 2024-11-15 DIAGNOSIS — M19041 Primary osteoarthritis, right hand: Secondary | ICD-10-CM

## 2024-11-15 DIAGNOSIS — Z8719 Personal history of other diseases of the digestive system: Secondary | ICD-10-CM

## 2024-11-26 ENCOUNTER — Ambulatory Visit: Admitting: Internal Medicine

## 2024-11-26 ENCOUNTER — Encounter: Payer: Self-pay | Admitting: Internal Medicine

## 2024-11-26 ENCOUNTER — Telehealth: Payer: Self-pay | Admitting: Obstetrics and Gynecology

## 2024-11-26 VITALS — BP 114/68 | HR 80 | Temp 97.9°F | Ht 60.0 in | Wt 104.0 lb

## 2024-11-26 DIAGNOSIS — K589 Irritable bowel syndrome without diarrhea: Secondary | ICD-10-CM | POA: Diagnosis not present

## 2024-11-26 DIAGNOSIS — M51369 Other intervertebral disc degeneration, lumbar region without mention of lumbar back pain or lower extremity pain: Secondary | ICD-10-CM

## 2024-11-26 DIAGNOSIS — N761 Subacute and chronic vaginitis: Secondary | ICD-10-CM

## 2024-11-26 DIAGNOSIS — N949 Unspecified condition associated with female genital organs and menstrual cycle: Secondary | ICD-10-CM

## 2024-11-26 MED ORDER — SACCHAROMYCES BOULARDII 250 MG PO CAPS: 250.0000 mg | ORAL_CAPSULE | Freq: Two times a day (BID) | ORAL | 2 refills | Status: AC

## 2024-11-26 NOTE — Patient Instructions (Signed)
 Use Aquaphor - rectal and vaginal area,  Use heat

## 2024-11-26 NOTE — Progress Notes (Signed)
 "  Subjective:  Patient ID: [name], female    DOB: 13-Mar-1936  Age: 88 y.o. MRN: [id number]  CC: Medical Management of Chronic Issues   HPI [name] presents for vaginal discomfort, vaginitis, between the legs discomfort, IBS with constipation She is here with her caregiver [facility]  Outpatient Medications Prior to Visit  Medication Sig Dispense Refill   acetaminophen  (TYLENOL ) 500 MG tablet Take 1,000 mg by mouth every 6 (six) hours as needed for mild pain.     albuterol  (VENTOLIN  HFA) 108 (90 Base) MCG/ACT inhaler Take 2 puffs every 4-6 hours as needed     amLODipine  (NORVASC ) 2.5 MG tablet Take 2.5 mg by mouth as needed.     Ascorbic Acid (VITAMIN C ) 100 MG tablet Take 1 tablet (100 mg total) by mouth daily. 100 tablet 1   azelastine  (ASTELIN ) 0.1 % nasal spray 1 spray in each nostril Nasally Once a day in the evening     Calcium  Carbonate-Vitamin D  (CALCIUM  CARBONATE W/VITAMIN D  PO) Take 1 tablet by mouth daily.     cyanocobalamin  (VITAMIN B12) 1000 MCG tablet Take 1 tablet (1,000 mcg total) by mouth daily. 100 tablet 3   desonide  (DESOWEN ) 0.05 % cream Apply 1 application topically as needed.     EPINEPHrine  (EPIPEN  2-PAK) 0.3 mg/0.3 mL IJ SOAJ injection Inject 0.3 mLs (0.3 mg total) into the muscle as needed. 1 Device 1   erythromycin  ophthalmic ointment Place 1 Application into both eyes as needed.     famotidine  (PEPCID ) 40 MG tablet Take 40 mg by mouth as needed for heartburn or indigestion.     fluticasone  (FLONASE ) 50 MCG/ACT nasal spray Place 2 sprays into both nostrils daily. 16 g 6   guaiFENesin -dextromethorphan (ROBITUSSIN DM) 100-10 MG/5ML syrup Take 5-10 mLs by mouth every 4 (four) hours as needed for cough (chest congestion). 240 mL 0   hydrocortisone  (ANUSOL -HC) 2.5 % rectal cream Place 1 Application rectally as needed for hemorrhoids or anal itching.     ipratropium (ATROVENT ) 0.06 % nasal spray Place 2 sprays into both nostrils 2 (two) times daily as needed for  rhinitis.     Lactase (LACTAID PO) Take by mouth.     levocetirizine (XYZAL ) 5 MG tablet Take 1 tablet (5 mg total) by mouth daily. 90 tablet 3   levothyroxine  (SYNTHROID ) 50 MCG tablet TAKE ONE TABLET BY MOUTH DAILY BEFORE BREAKFAST 30 tablet 5   LORazepam  (ATIVAN ) 1 MG tablet Take 2-3 tablets at bedtime and 1-2 tablets in the daytime as needed. 150 tablet 2   memantine  (NAMENDA  XR) 21 MG CP24 24 hr capsule Take 1 capsule (21 mg total) by mouth at bedtime. 90 capsule 3   montelukast  (SINGULAIR ) 10 MG tablet SMARTSIG:1 Tablet(s) By Mouth Every Evening     MYRBETRIQ  50 MG TB24 tablet SMARTSIG:1 Tablet(s) By Mouth     NONFORMULARY OR COMPOUNDED ITEM Gabapentin  6% cream in neutral base, apply in a thin layer to vulva once a day in the morning as needed.  Disp:  30 grams, RF one.  [employer]. 30 each 1   polyethylene glycol powder (GLYCOLAX /MIRALAX ) 17 GM/SCOOP powder Take 17 g by mouth 2 (two) times daily as needed for moderate constipation. 500 g 3   UNABLE TO FIND Med Name: CBD oil - under tongue     estradiol  (ESTRACE ) 0.01 % CREA vaginal cream Use 1 gram vaginally 3 times a week at bedtime (Patient not taking: Reported on 11/26/2024) 42.5 g 12  Magnesium  Sulfate (EPSOM SALT) POWD UAD for warm water soaks (Patient not taking: Reported on 11/26/2024) 500 g 0   MegaRed Omega-3 Krill Oil 500 MG CAPS Take 1 capsule by mouth every morning. (Patient not taking: Reported on 11/26/2024) 100 capsule 3   pantoprazole  (PROTONIX ) 40 MG tablet TAKE ONE TABLET BY MOUTH TWICE DAILY BEFORE MEALS (Patient not taking: Reported on 11/26/2024) 60 tablet 5   psyllium (METAMUCIL) 58.6 % packet Take 1 packet by mouth as needed. (Patient not taking: Reported on 11/26/2024)     triamcinolone  ointment (KENALOG ) 0.1 % Apply 1 Application topically 3 (three) times daily. As needed for itching (Patient not taking: Reported on 11/26/2024) 80 g 2   No facility-administered medications prior to visit.    ROS: Review  of Systems  Constitutional:  Positive for fatigue. Negative for activity change, appetite change, chills and unexpected weight change.  HENT:  Negative for congestion, mouth sores and sinus pressure.   Eyes:  Negative for visual disturbance.  Respiratory:  Negative for cough and chest tightness.   Gastrointestinal:  Negative for abdominal pain and nausea.  Genitourinary:  Negative for difficulty urinating, frequency and vaginal pain.  Musculoskeletal:  Positive for arthralgias, back pain and neck stiffness. Negative for gait problem.  Skin:  Negative for pallor and rash.  Neurological:  Negative for dizziness, tremors, weakness, numbness and headaches.  Hematological:  Bruises/bleeds easily.  Psychiatric/Behavioral:  Positive for decreased concentration. Negative for agitation, confusion, self-injury, sleep disturbance and suicidal ideas. The patient is nervous/anxious.     Objective:  BP 114/68 (BP Location: Left Arm, Patient Position: Sitting, Cuff Size: Normal)   Pulse 80   Temp 97.9 F (36.6 C) (Oral)   Ht 5' (1.524 m)   Wt 104 lb (47.2 kg)   SpO2 96%   BMI 20.31 kg/m   BP Readings from Last 3 Encounters:  11/26/24 114/68  11/06/24 121/70  10/31/24 114/66    Wt Readings from Last 3 Encounters:  11/26/24 104 lb (47.2 kg)  11/06/24 105 lb (47.6 kg)  10/18/24 103 lb 3.2 oz (46.8 kg)    Physical Exam Constitutional:      General: She is not in acute distress.    Appearance: She is well-developed.  HENT:     Head: Normocephalic.     Right Ear: External ear normal.     Left Ear: External ear normal.     Nose: Nose normal.  Eyes:     General:        Right eye: No discharge.        Left eye: No discharge.     Conjunctiva/sclera: Conjunctivae normal.     Pupils: Pupils are equal, round, and reactive to light.  Neck:     Thyroid : No thyromegaly.     Vascular: No JVD.     Trachea: No tracheal deviation.  Cardiovascular:     Rate and Rhythm: Normal rate and regular  rhythm.     Heart sounds: Normal heart sounds.  Pulmonary:     Effort: No respiratory distress.     Breath sounds: No stridor. No wheezing.  Abdominal:     General: Bowel sounds are normal. There is no distension.     Palpations: Abdomen is soft. There is no mass.     Tenderness: There is no abdominal tenderness. There is no guarding or rebound.  Musculoskeletal:        General: No tenderness.     Cervical back: Normal range of motion and  neck supple. No rigidity.  Lymphadenopathy:     Cervical: No cervical adenopathy.  Skin:    Findings: No erythema or rash.  Neurological:     Mental Status: Mental status is at baseline.     Cranial Nerves: No cranial nerve deficit.     Motor: No abnormal muscle tone.     Coordination: Coordination normal.     Deep Tendon Reflexes: Reflexes normal.  Psychiatric:        Behavior: Behavior normal.        Thought Content: Thought content normal.        Judgment: Judgment normal.   Slightly confused at times, repeats herself several times Abdomen is soft nontender  Lab Results  Component Value Date   WBC 6.4 09/12/2024   HGB 13.0 09/12/2024   HCT 38.1 09/12/2024   PLT 314.0 09/12/2024   GLUCOSE 93 09/12/2024   CHOL 274 (H) 02/15/2024   TRIG 181.0 (H) 02/15/2024   HDL 72.00 02/15/2024   LDLDIRECT 98.0 08/13/2021   LDLCALC 166 (H) 02/15/2024   ALT 15 09/12/2024   AST 20 09/12/2024   NA 132 (L) 09/12/2024   K 3.9 09/12/2024   CL 96 09/12/2024   CREATININE 0.62 09/12/2024   BUN 12 09/12/2024   CO2 29 09/12/2024   TSH 1.63 07/02/2024   HGBA1C 5.5 06/13/2020    MR PELVIS W WO CONTRAST Result Date: 11/10/2024 CLINICAL DATA:  Vaginal pain. EXAM: MRI PELVIS WITHOUT AND WITH CONTRAST TECHNIQUE: Multiplanar multisequence MR imaging of the pelvis was performed both before and after administration of intravenous contrast. CONTRAST:  7.5 cc Vueway  COMPARISON:  None Available. FINDINGS: Urinary Tract: Bladder is decompressed, otherwise  unremarkable. No urethral diverticulum. Bowel: Diverticular disease noted in the sigmoid colon without MR evidence for diverticulitis. Visualized small bowel loops of the pelvis are nondilated. Vascular/Lymphatic: No pathologically enlarged lymph nodes. No significant vascular abnormality seen. Reproductive: Uterine fibroids evident measuring up to 2.2 cm in the anterior body region. Thin endometrial stripe on the order of 3 mm. Circumferential low signal of cervical stroma is preserved. Neither ovary is discretely visible.  No adnexal mass. No cystic lesions in the region of the vaginal introitus to suggest Bartholin gland cyst. No lesions higher in the vaginal region to suggest etiology such as Gartner duct cyst. No suspicious abnormal enhancement in the vagina. Other:  No intraperitoneal free fluid. Musculoskeletal: No focal suspicious marrow enhancement within the visualized bony anatomy. IMPRESSION: 1. No acute findings. Specifically, no findings to explain the patient's history of vaginal pain. 2. Uterine fibroids. 3. Sigmoid diverticulosis without MR evidence for diverticulitis. Electronically Signed   By: Camellia Candle M.D.   On: 11/10/2024 13:33    Assessment & Plan:   Problem List Items Addressed This Visit     Vaginitis   Aquaphor, heat      Lumbar degenerative disc disease   Relevant Orders   Ambulatory referral to Physical Therapy   Vaginal discomfort - Primary   Pelvic MRI IMPRESSION: 1. No acute findings. Specifically, no findings to explain the patient's history of vaginal pain. 2. Uterine fibroids. 3. Sigmoid diverticulosis without MR evidence for diverticulitis. Electronically Signed   By: Camellia Candle M.D.   On: 11/10/2024 13:33   Overactive/painfil bladder [employer] for women Use estrogen cream daily Heated rice sock between the legs 3-4 times a day Use Lidocaine  vaginal cream     Use Aquaphor - rectal and vaginal area,  Use heat  Meds ordered this encounter   Medications   saccharomyces boulardii (FLORASTOR) 250 MG capsule    Sig: Take 1 capsule (250 mg total) by mouth 2 (two) times daily.    Dispense:  30 capsule    Refill:  2      Follow-up: Return in about 6 weeks (around 01/07/2025) for a follow-up visit.  Marolyn Noel, MD "

## 2024-11-26 NOTE — Telephone Encounter (Signed)
 Spoke with patient, patient request call back in 30 min.

## 2024-11-26 NOTE — Telephone Encounter (Signed)
 Spoke with patient, advised per Dr. Nikki. Patient states she started the gabapentin  cream, is noticing improvement. OV scheduled for 12/03/24 at 1430 with Dr. Nikki.   Routing to provider for final review. Patient is agreeable to disposition. Will close encounter.

## 2024-11-26 NOTE — Telephone Encounter (Signed)
 Please schedule a recheck appointment with me.   Patient was started on Gabapentin  cream this month with the plan for a follow up visit.   I would like to have her return in January or February.   I see that she had her pelvic MRI done through her PCP and that it was normal.

## 2024-11-26 NOTE — Assessment & Plan Note (Addendum)
 Aquaphor, use rice bag heating pad between the legs Using estrogen cream

## 2024-11-26 NOTE — Assessment & Plan Note (Addendum)
 Pelvic MRI IMPRESSION: 1. No acute findings. Specifically, no findings to explain the patient's history of vaginal pain. 2. Uterine fibroids. 3. Sigmoid diverticulosis without MR evidence for diverticulitis. Electronically Signed   By: Camellia Candle M.D.   On: 11/10/2024 13:33   Overactive/painfil bladder [employer]ers for women Use estrogen cream daily Heated rice sock between the legs 3-4 times a day Use Lidocaine  vaginal cream     Use Aquaphor - rectal and vaginal area,  Use heat

## 2024-11-27 ENCOUNTER — Ambulatory Visit: Admitting: Internal Medicine

## 2024-11-27 ENCOUNTER — Ambulatory Visit (INDEPENDENT_AMBULATORY_CARE_PROVIDER_SITE_OTHER): Admitting: Psychology

## 2024-11-27 DIAGNOSIS — F0670 Mild neurocognitive disorder due to known physiological condition without behavioral disturbance: Secondary | ICD-10-CM

## 2024-11-27 NOTE — Progress Notes (Signed)
"  ° °  Neuropsychology Feedback Session Jolynn DEL. [facility] [place] Department of Neurology  Reason for Referral:   [name] is a 88 y.o. right-handed Caucasian female referred by Camie Sevin, PA-C, to characterize her current cognitive functioning and assist with diagnostic clarity and treatment planning in the context of subjective cognitive decline.   Feedback:   [name] completed a comprehensive neuropsychological evaluation on 11/14/2024. Please refer to that encounter for the full report and recommendations. Briefly, results suggested a primary deficit surrounding delayed retrieval aspects of memory. Variability was further noted across recognition/consolidation aspects of memory. Isolated impairments were exhibited across a rapid visuomotor sequencing task and clock drawing; however, performance across all other tasks assessing processing speed and visuospatial abilities were appropriate respectively. Relative to her previous evaluation in October 2024, mild decline was exhibited across delayed retrieval aspects of memory. This can be primarily seen across retention rates. Where [name] exhibited retention rates of 17% across a list learning task and 35% across a figure drawing task in 2024, these rates were currently 0% and 12% respectively. She also had greater difficulty drawing a clock presently. Outside of this, performance across all other assessed domains were stable over time. The cause for memory dysfunction continues to be unclear presently. Given diminished retention rates across memory testing and some further weakness across recognition trials, there is some concern surrounding rapid forgetting and an evolving storage impairment. This, in turn, could elevate concerns for an underlying neurodegenerative illness. However, delayed retention of previously learned story-based content remained strong (70% presently). Other non-memory areas additionally saw no appreciable  decline over the past 14 months. As stated in 2024, while the earlier stages of a neurodegenerative illness cannot be ruled out, one cannot be ruled in with confidence based on testing patterns alone. Continued medical monitoring will be important moving forward.  [name] was accompanied by her caretaker during the current feedback session. Content of the current session focused on the results of her neuropsychological evaluation. [name] was given the opportunity to ask questions and her questions were answered. She was encouraged to reach out should additional questions arise. A copy of her report was provided at the conclusion of the visit.      One unit 96132 (31 minutes) was billed for Dr. Loralee time spent preparing for, conducting, and documenting the current feedback session with [name]. "

## 2024-12-03 ENCOUNTER — Telehealth: Payer: Self-pay | Admitting: *Deleted

## 2024-12-03 ENCOUNTER — Encounter: Payer: Self-pay | Admitting: Obstetrics and Gynecology

## 2024-12-03 ENCOUNTER — Ambulatory Visit: Admitting: Obstetrics and Gynecology

## 2024-12-03 VITALS — BP 120/72 | HR 81

## 2024-12-03 DIAGNOSIS — R159 Full incontinence of feces: Secondary | ICD-10-CM

## 2024-12-03 DIAGNOSIS — R102 Pelvic and perineal pain unspecified side: Secondary | ICD-10-CM

## 2024-12-03 DIAGNOSIS — K644 Residual hemorrhoidal skin tags: Secondary | ICD-10-CM | POA: Diagnosis not present

## 2024-12-03 DIAGNOSIS — K5900 Constipation, unspecified: Secondary | ICD-10-CM

## 2024-12-03 MED ORDER — METAMUCIL FIBER 2 G PO CHEW: 2.0000 g | CHEWABLE_TABLET | ORAL | 1 refills | Status: DC

## 2024-12-03 NOTE — Telephone Encounter (Signed)
 How about Metamucil capsules? I would recommend Metamucil capsules, 2 by mouth two days per week.  Disp:  42, RF 1.   Patient would need a follow up call to clarify the change.  Her housekeeper Orlene was with her today for the appointment so she could also hear the new instructions.

## 2024-12-03 NOTE — Telephone Encounter (Signed)
 Spoke with Luke at [facility].  Can do metamucil capsules, would be 5 capsules for 2 grams, twice a week.   Fiber gummy would be 2 gummies twice a week.   Dr. Nikki -do you think patient will tolerate 5 capsules?

## 2024-12-03 NOTE — Progress Notes (Signed)
 "  Office Visit Note  Patient: [name]             Date of Birth: 1936-10-06           MRN: [id number]             PCP: Garald Karlynn GAILS, MD Referring: Garald Karlynn GAILS, MD Visit Date: 12/17/2024 Occupation: Data Unavailable Helper[name]   Subjective:  Pain in joints  History of Present Illness: [name] is a 89 y.o. female with osteoarthritis, degenerative disc disease and osteoporosis.  She returns today after her last visit in June 2025.  She continues to have some discomfort in the trapezius region and also lower back.  She does not have much discomfort in her hands and feet.  She continues to have some discomfort in the piriformis region and lower back.  She takes Tylenol  and uses topical diclofenac  gel.    Activities of Daily Living:  Patient reports morning stiffness for 1 hour.   Patient Reports nocturnal pain.  Difficulty dressing/grooming: Denies Difficulty climbing stairs: Denies Difficulty getting out of chair: Denies Difficulty using hands for taps, buttons, cutlery, and/or writing: Denies  Review of Systems  Constitutional:  Negative for fatigue.  HENT:  Positive for mouth dryness. Negative for mouth sores.   Eyes:  Negative for dryness.  Respiratory:  Negative for shortness of breath.   Cardiovascular:  Negative for chest pain and palpitations.  Gastrointestinal:  Negative for blood in stool, constipation and diarrhea.  Endocrine: Negative for increased urination.  Genitourinary:  Negative for involuntary urination.  Musculoskeletal:  Positive for joint pain, joint pain, myalgias, morning stiffness and myalgias. Negative for gait problem, joint swelling, muscle weakness and muscle tenderness.  Skin:  Negative for color change, rash, hair loss and sensitivity to sunlight.  Allergic/Immunologic: Negative for susceptible to infections.  Neurological:  Negative for dizziness and headaches.  Hematological:  Negative for swollen glands.   Psychiatric/Behavioral:  Negative for depressed mood and sleep disturbance. The patient is not nervous/anxious.     PMFS History:  Patient Active Problem List   Diagnosis Date Noted   Vaginal discomfort 10/21/2024   Paronychia of left middle finger 05/22/2024   Postnasal drip 04/30/2024   Episode of confusion 04/02/2024   Wheezing 01/24/2024   Statin intolerance 01/09/2024   Chronic low back pain 11/28/2023   Dyslipidemia 11/28/2023   Mild neurocognitive disorder with memory loss 09/15/2023   CHF (congestive heart failure)    Hypotension 07/13/2023   Chalazion left upper eyelid 04/18/2023   Bruising 04/06/2023   Pain of right sacroiliac joint 01/14/2023   Lumbar degenerative disc disease 12/21/2022   Chronic left SI joint pain 11/30/2022   Inflammation of sacroiliac joint 11/16/2022   Stool incontinence 11/16/2022   Grief 10/06/2022   History of basal cell carcinoma 09/29/2022   Stress at home 07/21/2022   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   Gait disorder 02/28/2022   Melanocytic nevi of trunk 02/22/2022   Seborrheic dermatitis 02/22/2022   Urticaria 02/22/2022   Concussion 10/29/2021   Allergic rhinitis due to animal (cat) (dog) hair and dander 08/07/2021   Allergic rhinitis due to pollen 08/07/2021   Mild intermittent asthma 08/07/2021   Cervical spine instability 05/15/2021   Cervical spinal cord compression 05/04/2021   Degenerative disc disease, cervical 03/11/2021   Spondylolisthesis, cervical region 03/11/2021   Trochanteric bursitis of right hip 09/22/2020   Meningioma 03/25/2020   Cholelithiasis 09/09/2019   Cervical spondylosis 01/05/2019   Neck pain 12/26/2018  Generalized anxiety disorder 08/22/2018   Contact dermatitis and eczema 06/14/2018   Constipation 11/11/2016   Sinusitis, chronic 08/06/2016   Aortic valve sclerosis 03/12/2015   Fatigue 09/08/2014   Increased frequency of urination 08/19/2014   Urinary urgency 08/19/2014   Postherpetic  neuralgia 10/02/2013   Insomnia 01/24/2013   Pruritus of skin 03/11/2012   MVP (mitral valve prolapse)    Multiple thyroid  nodules 10/04/2011   Essential hypertension, benign 07/27/2011   Vaginitis    Hyperlipemia, mixed 03/15/2011   OAB (overactive bladder) 09/02/2010   Hypothyroidism 03/03/2010   Irritable bowel syndrome 09/04/2008   History of colonic polyps 09/04/2008   Atrophic gastritis 09/03/2008   Duodenitis 09/03/2008   Tibialis tendinitis 03/20/2008   Osteoarthritis 02/25/2008   Lactose intolerance 11/20/2007   GERD (gastroesophageal reflux disease) 11/20/2007   Osteoporosis 11/20/2007    Past Medical History:  Diagnosis Date   Acute pain of right shoulder 11/28/2023   Voltaren  gel  X ray R shoulder  Medrol  pack  OK CBD use     Allergic rhinitis due to animal (cat) (dog) hair and dander 08/07/2021   Allergic rhinitis due to pollen 08/07/2021   Aortic valve sclerosis 03/12/2015   Atrophic gastritis 09/03/2008   Atrophic vaginitis    Baker's cyst    Left-Dr. Melodi   Bladder pain 08/19/2014   Chronic  Take Vesicare  10 mg/d  UA was ok  On Premarin cream PV per GYN  On Vesicare   F/u w/Dr JONELLE Moats     Cataract    Dr. Octavia   Cerumen impaction 07/16/2013   8/14  08/2023  I tried to remove cerumen impaction out of the right ear with a plastic loop.  It was painful and we stopped.  No bleeding  Cortisporin eardrops prescribed just in case.  Use Debrox wax removing At home  She ENT if needed     Cervical spinal cord compression 05/04/2021   Cervical spine instability 05/15/2021   Dr. Unice: plan -  posterior C1-2 decompression and instrumented fusion.  MRI: Progressive soft tissue pannus at C1-2 is now creating mass  effect on the craniocervical junction with distortion of the upper  spinal cord. This is likely related rheumatoid arthritis  C spine CT: Fracture of the anterior arch of C1 with 12 mm separation. Nondisplaced fracture posterior arch of C1. There is later    Cervical spondylosis 01/05/2019   Chalazion left upper eyelid 04/18/2023   X2  Keflex  po - too big... Will use Ceftin   F/u w/Ophthalmology   CHF (congestive heart failure)    Cholelithiasis 09/09/2019   Per Dr Abran: At this point we mutually decided to watch her abdominal complaints.  If symptoms accelerate or become more classic for symptomatic cholelithiasis, then she is agreeable to surgical referral.  She will keep me posted   Chronic left SI joint pain 11/30/2022   Closed fracture of first cervical vertebra 08/07/2021   Closed fracture of second cervical vertebra 08/07/2021   Concussion 10/29/2021   s/p a hard fall after she rushed and slipped on the wet marble floor while visiting family in [place]  a couple week ago.  She hit the side of her skull, had a skin laceration bled quite a bit.  She was dazed and according to her son [name] had a loss of consciousness of under a minute duration.  She felt dazed.  She was taken to ER.  2 staples were applied to her skin laceration on the left o  Constipation 11/11/2016   Chronic, multifactorial  Miralax  prn  7/22 Postop constipation related to oxycodone  and Robaxin .  We can discontinue oxycodone  Robaxin .  The patient will use tramadol  and Tylenol  instead.  Use MiraLAX  or Senokot as to produce soft regular stools.  Discontinue Pepto-Bismol.  I think this should help with abdominal bloating complaint.      Take Metamucil daily  Senokot as to produce soft regular sto   Contact dermatitis and eczema 06/14/2018   Eczema vs other - 7/19  Cortaid prn  Depo-medrol  IM 80 mg  Remove nail polish     Contusion of left chest wall 01/09/2015   Johnston fell and broke a rib #8 on the L last week, she had a rib X ray and a CT (Dr Jane).   Contusion of right knee 08/12/2016   R knee   Degenerative disc disease, cervical 03/11/2021   Duodenitis 09/03/2008   Essential hypertension, benign 07/27/2011   Chronic, mild  NAS diet  Cardiac CT scan for calcium  scoring  offered 1/20  1/24 HTN and s/p ER visit on 12/08/22 for SBP 180. Per Dr Randol:  Discussed with the patient that her restarting donepezil  could have impacted her symptoms today.  However, her blood pressure continues to be mildly elevated.  Will place her on 2.5 mg amlodipine  to take if her blood pressure is over 170/100.  Nursing aide at   Fatigue 09/08/2014   10/15, 2/16, 9/17, 2/18  No other sx  Charlsie is managing it with short rest periods 1-2/d     Fibroid    Gait disorder 02/28/2022   Worse  Multifactorial  Cont w/PT  In the balance class  Balance class q 1 week     [name] resolved after doing exercises by Dr Harvey 11/2021     Generalized anxiety disorder 08/22/2018   Chronic   Lexapro  - not taking  Weighted blanket  Lorazepam  prn, - tolerance has developed   Potential benefits of a long term benzodiazepines  use as well as potential risks  and complications were explained to the patient and were aknowledged.     GERD (gastroesophageal reflux disease) 11/20/2007   Chronic. Better on Gluten free diet  Dexilant    Potential benefits of a long term PPI  use as well as potential risks  and complications were explained to the patient and were aknowledged.  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     History of basal cell carcinoma 09/29/2022   History of colonic polyps 09/04/2008   Hyperlipemia, mixed 03/15/2011   Chronic  On Simvastatin      Hypotension 07/13/2023   Stop Amlodipine      Hypothyroidism    Impacted cerumen of right ear 07/16/2013   Increased frequency of urination 08/19/2014   Inflammation of sacroiliac joint 11/16/2022   Insomnia 01/24/2013   Lorazepam  prn   Potential benefits of a long term benzodiazepines  use as well as potential risks  and complications were explained to the patient and were  aknowledged.        Irritable bowel syndrome 09/04/2008   Chronic   Abran MD, Norleen SAILOR  2013 - much better on gluten free diet  Lactose intolerance 11/20/2007   Lactaid        Lumbar degenerative disc disease 12/21/2022   Based on MRI and her exam there is significant DDD in lumbar spine     Lumbar spondylosis    Melanocytic nevi of trunk 02/22/2022   Meningioma 03/25/2020   Dr Unice     Mild intermittent asthma 08/07/2021   Mild neurocognitive disorder with memory loss 09/15/2023   Multiple thyroid  nodules 10/04/2011   2013 she was started on Synthroid  - tol well  2017, 2022 US  OK     MVP (mitral valve prolapse)    Antibiotics required for dental procedures   Neck pain 12/26/2018   Chronic  PT, traction, TENs unit was offered  Tramadol  prn   Potential benefits of a long term opioids use as well as potential risks (i.e. addiction risk, apnea etc) and complications (i.e. Somnolence, constipation and others) were explained to the patient and were aknowledged.        OAB (overactive bladder) 09/02/2010   Dr Nicholaus  Use Solifenacin  prn  Worse - ?multifactorial  UA was normal  Camie increased Memantine  to 10 mg bid a month ago     Osteoarthritis 02/25/2008   Dr Bonner  Blue-Emu cream was recommended to use 2-3 times a day        Osteoporosis 11/20/2007   Chronic  Per GYN        Overactive bladder    Pain of right sacroiliac joint 01/14/2023   Postherpetic neuralgia 10/02/2013   Pruritus of skin 03/11/2012   S/p H zoster vaccination  Relapsing  8/12 - on face  8/14 - R ear/face  10/14 ?     Sacroiliitis    Seborrheic dermatitis 02/22/2022   Sinusitis, chronic 08/06/2016   Worse  Flonase , Atrovent  nasal, Singulair , Claritin   ENT ref Dr Thaddeus  CT sinuses     Skin ulcer of nose 07/13/2023   Soft pads on the nose  Derm appt     Spondylolisthesis, cervical region 03/11/2021   Stool incontinence 11/16/2022   12/23  Worse.  Stool leakage - new  D/c Aricept   Abd X ray to rule out  constipation     Tibialis tendinitis 03/20/2008   Torn meniscus    bilateral   Trochanteric bursitis of right hip 09/22/2020   Upper respiratory infection 08/03/2012   9/13 - 3 wks  2/17  11/18 - asthmatic bronchitis     Urinary urgency 08/19/2014   2023 worse.  Obtain urinalysis.  D/c Aricept   UA was ok  On Premarin cream PV per GYN  On Vesicare   F/u w/Dr JONELLE Nicholaus     Urticaria 02/22/2022    Family History  Problem Relation Age of Onset   Heart disease Mother    Hypertension Mother    Osteoporosis Mother    Congestive Heart Failure Mother    Pancreatic cancer Father    Heart attack Brother    Drug abuse Brother        over dose    Healthy Son    Healthy Son    Colon cancer Neg Hx    Colon polyps Neg Hx    Rectal cancer Neg Hx    Stomach cancer Neg Hx    Breast cancer Neg Hx    Past Surgical History:  Procedure Laterality Date   CATARACT EXTRACTION, BILATERAL     EYE SURGERY     POSTERIOR CERVICAL FUSION/FORAMINOTOMY N/A 05/15/2021   Procedure: Cervical One Laminectomy with  resection of cyst, Fixation from Occiput to Cervical Four;  Surgeon: Unice Pac, MD;  Location: [facility] OR;  Service: Neurosurgery;  Laterality: N/A;   TONSILLECTOMY     Social History   Tobacco Use   Smoking status: Former    Current packs/day: 0.00    Types: Cigarettes    Start date: 1963    Quit date: 1973    Years since quitting: 53.0    Passive exposure: Never   Smokeless tobacco: Never   Tobacco comments:    Former smoker 06/25/22  Vaping Use   Vaping status: Never Used  Substance Use Topics   Alcohol use: Yes    Alcohol/week: 1.0 standard drink of alcohol    Types: 1 Glasses of wine per week    Comment: Socially   Drug use: No   Social History   Social History Narrative   Regular exercise-yesDaily caffeine use   Right handed   Lives alone   retired     Financial Risk Analyst History  Administered Date(s) Administered   Fluad Quad(high Dose 65+) 08/04/2019, 09/16/2021, 09/12/2022    Fluad Trivalent(High Dose 65+) 09/01/2023   INFLUENZA, HIGH DOSE SEASONAL PF 09/03/2013, 02/19/2015, 11/12/2015, 08/06/2016, 11/11/2016, 08/20/2017, 08/31/2017, 09/20/2018, 09/12/2019   Influenza Whole 09/29/2007, 08/21/2008, 09/02/2010, 07/30/2012   Influenza,inj,Quad PF,6+ Mos 12/17/2014, 07/30/2015   PFIZER(Purple Top)SARS-COV-2 Vaccination 12/12/2019, 12/31/2019, 07/29/2020, 07/29/2020, 07/31/2020, 09/10/2020   Pfizer Covid-19 Vaccine Bivalent Booster 63yrs & up 08/21/2021   Pfizer(Comirnaty )Fall Seasonal Vaccine 12 years and older 09/29/2022, 09/11/2024   Pneumococcal Conjugate-13 01/09/2014   Pneumococcal Polysaccharide-23 09/26/2006, 08/12/2015, 11/11/2016, 08/31/2017, 09/10/2020, 09/10/2021   Td 04/14/2010   Tdap 06/18/2020   Typhoid Inactivated 02/23/2013   Zoster, Live 12/09/2006     Objective: Vital Signs: BP 124/76   Pulse 80   Temp 97.8 F (36.6 C)   Resp 13   Ht 5' (1.524 m)   Wt 104 lb 12.8 oz (47.5 kg)   BMI 20.47 kg/m    Physical Exam Vitals and nursing note reviewed.  Constitutional:      Appearance: She is well-developed.  HENT:     Head: Normocephalic and atraumatic.  Eyes:     Conjunctiva/sclera: Conjunctivae normal.  Cardiovascular:     Rate and Rhythm: Normal rate and regular rhythm.     Heart sounds: Normal heart sounds.  Pulmonary:     Effort: Pulmonary effort is normal.     Breath sounds: Normal breath sounds.  Abdominal:     General: Bowel sounds are normal.     Palpations: Abdomen is soft.  Musculoskeletal:     Cervical back: Normal range of motion.  Skin:    General: Skin is warm and dry.     Capillary Refill: Capillary refill takes less than 2 seconds.  Neurological:     Mental Status: She is alert and oriented to person, place, and time.  Psychiatric:        Behavior: Behavior normal.      Musculoskeletal Exam: She had limited lateral rotation, flexion and extension of the cervical spine.  She had bilateral trapezius spasm.  She  had limited range of motion of her thoracic and lumbar spine with tenderness in the paravertebral lumbar region.  Shoulder joints and elbow joints were in good range of motion.  She had bilateral CMC subluxation and thickening.  PIP and DIP thickening with subluxation of some of the DIP joints was noted.  No synovitis was noted.  Hip joints and knee joints in good range of motion.  There was no tenderness over ankles or MTPs.  CDAI Exam: CDAI Score: -- Patient Global: --; Provider Global: -- Swollen: --; Tender: -- Joint Exam 12/17/2024   No joint exam has been documented for this visit   There is currently no information documented on the homunculus. Go to the Rheumatology activity and complete the homunculus joint exam.  Investigation: No additional findings.  Imaging: No results found.   Recent Labs: Lab Results  Component Value Date   WBC 6.4 09/12/2024   HGB 13.0 09/12/2024   PLT 314.0 09/12/2024   NA 132 (L) 09/12/2024   K 3.9 09/12/2024   CL 96 09/12/2024   CO2 29 09/12/2024   GLUCOSE 93 09/12/2024   BUN 12 09/12/2024   CREATININE 0.62 09/12/2024   BILITOT 0.6 09/12/2024   ALKPHOS 38 (L) 09/12/2024   AST 20 09/12/2024   ALT 15 09/12/2024   PROT 7.4 09/12/2024   ALBUMIN  4.4 09/12/2024   CALCIUM  9.3 09/12/2024   GFRAA 87 07/17/2020    Speciality Comments: No specialty comments available.  Procedures:  No procedures performed Allergies: Bee venom, Bacitracin -polymyxin b, Clarithromycin, Diclofenac , Neosporin [neomycin -bacitracin  zn-polymyx], and Hydrocortisone    Assessment / Plan:     Visit Diagnoses: Primary osteoarthritis of both hands-she continues to have some stiffness in her hands but not much discomfort.  She notices decreased grip strength.  Clinical findings are suggestive of osteoarthritis with bilateral CMC PIP and DIP thickening and subluxation of the [facility] and DIP joints.  A handout on hand exercises was given.  Primary osteoarthritis of both feet-she  denies any discomfort in her feet.  There was no tenderness over her ankles or MTPs.  Spasm of both trapezius muscles-she complains to have bilateral trapezius spasm.  She states the pain is manageable.  She had trigger point injections in June 2025.  DDD (degenerative disc disease), cervical - Status post fusion by Dr. Unice.  She she continues to have limited range of motion of the cervical spine.  She denies any radiculopathy.  A handout on cervical spine exercise was given.  Piriformis syndrome of both sides-she reports intermittent discomfort.  Stretching exercises were discussed.  Degeneration of intervertebral disc of lumbar region without discogenic back pain or lower extremity pain - Grade 1 anterolisthesis of L4 versus L5 and grade 1 retrolisthesis of L1 versus L2 and L2 versus L3.  She had multilevel spondylosis and facet joint arthropathy.  She continues to have lower back pain.  A handout on back exercises was given.  Chronic SI joint pain-chronic discomfort.  Age-related osteoporosis without current pathological fracture - she continues to be on Prolia  by her GYN.  She is supposed to get repeat DEXA scan in March 2026.  Gait instability-she ambulates with the help of a cane.  She was accompanied by an assistant named [name] today.  Other medical problems are listed as follows:  History of hyperlipidemia  History of IBS  History of gastroesophageal reflux (GERD)  Essential hypertension, benign  Hypothyroidism due to acquired atrophy of thyroid   Orders: No orders of the defined types were placed in this encounter.  No orders of the defined types were placed in this encounter.   Follow-Up Instructions: Return in about 6 months (around 06/16/2025) for Osteoarthritis, Osteoporosis.   Maya Nash, MD  Note - This record has been created using Animal nutritionist.  Chart creation errors have been sought, but may not always  have been located. Such creation errors do  not reflect on  the standard  of medical care. "

## 2024-12-03 NOTE — Progress Notes (Unsigned)
 "  GYNECOLOGY  VISIT   HPI: 89 y.o.   Married  Caucasian female   [phone] with No LMP recorded. Patient is postmenopausal.   here for: Med f/u - Gabapentin  cream & Aquaphor.  Vaginal discomfort is improved.   [name] her housekeeper is with her today for the visit.   Still some vaginal discomfort, but it is improved.   Feels like she has a little bit of leakage from the rectum.  Instructed by her PCP to use diaper type wipes for her bottom.   Has some constipation.   No vaginal bleeding.   Less discomfort since stopping estrogen cream.     Did not have benefit from gabapentin  use.   Normal pelvic MRI.    GYNECOLOGIC HISTORY: No LMP recorded. Patient is postmenopausal. Contraception:  PMP Menopausal hormone therapy:  estrace   Last 2 paps:  10/31/24 neg, 08/07/21 neg  History of abnormal Pap or positive HPV:  no Mammogram:  04/16/24 Breast Density Cat B, BIRADS Cat 1 neg         OB History     Gravida  3   Para  2   Term  2   Preterm      AB  1   Living  2      SAB  1   IAB      Ectopic      Multiple      Live Births                 Patient Active Problem List   Diagnosis Date Noted   Vaginal discomfort 10/21/2024   Paronychia of left middle finger 05/22/2024   Postnasal drip 04/30/2024   Episode of confusion 04/02/2024   Wheezing 01/24/2024   Statin intolerance 01/09/2024   Chronic low back pain 11/28/2023   Dyslipidemia 11/28/2023   Mild neurocognitive disorder with memory loss 09/15/2023   CHF (congestive heart failure)    Hypotension 07/13/2023   Chalazion left upper eyelid 04/18/2023   Bruising 04/06/2023   Pain of right sacroiliac joint 01/14/2023   Lumbar degenerative disc disease 12/21/2022   Chronic left SI joint pain 11/30/2022   Inflammation of sacroiliac joint 11/16/2022   Stool incontinence 11/16/2022   Grief 10/06/2022   History of basal cell carcinoma 09/29/2022   Stress at home 07/21/2022   Glossopharyngeal neuralgia  07/05/2022   Gait disorder 02/28/2022   Melanocytic nevi of trunk 02/22/2022   Seborrheic dermatitis 02/22/2022   Urticaria 02/22/2022   Concussion 10/29/2021   Allergic rhinitis due to animal (cat) (dog) hair and dander 08/07/2021   Allergic rhinitis due to pollen 08/07/2021   Mild intermittent asthma 08/07/2021   Cervical spine instability 05/15/2021   Cervical spinal cord compression 05/04/2021   Degenerative disc disease, cervical 03/11/2021   Spondylolisthesis, cervical region 03/11/2021   Trochanteric bursitis of right hip 09/22/2020   Meningioma 03/25/2020   Cholelithiasis 09/09/2019   Cervical spondylosis 01/05/2019   Neck pain 12/26/2018   Generalized anxiety disorder 08/22/2018   Contact dermatitis and eczema 06/14/2018   Constipation 11/11/2016   Sinusitis, chronic 08/06/2016   Aortic valve sclerosis 03/12/2015   Fatigue 09/08/2014   Increased frequency of urination 08/19/2014   Urinary urgency 08/19/2014   Postherpetic neuralgia 10/02/2013   Insomnia 01/24/2013   Pruritus of skin 03/11/2012   MVP (mitral valve prolapse)    Multiple thyroid  nodules 10/04/2011   Essential hypertension, benign 07/27/2011   Vaginitis    Hyperlipemia, mixed 03/15/2011   OAB (  overactive bladder) 09/02/2010   Hypothyroidism 03/03/2010   Irritable bowel syndrome 09/04/2008   History of colonic polyps 09/04/2008   Atrophic gastritis 09/03/2008   Duodenitis 09/03/2008   Tibialis tendinitis 03/20/2008   Osteoarthritis 02/25/2008   Lactose intolerance 11/20/2007   GERD (gastroesophageal reflux disease) 11/20/2007   Osteoporosis 11/20/2007    Past Medical History:  Diagnosis Date   Acute pain of right shoulder 11/28/2023   Voltaren  gel  X ray R shoulder  Medrol  pack  OK CBD use     Allergic rhinitis due to animal (cat) (dog) hair and dander 08/07/2021   Allergic rhinitis due to pollen 08/07/2021   Aortic valve sclerosis 03/12/2015   Atrophic gastritis 09/03/2008   Atrophic  vaginitis    Baker's cyst    Left-Dr. Aluisio   Bladder pain 08/19/2014   Chronic  Take Vesicare  10 mg/d  UA was ok  On Premarin cream PV per GYN  On Vesicare   F/u w/Dr JONELLE Moats     Cataract    Dr. Octavia   Cerumen impaction 07/16/2013   8/14  08/2023  I tried to remove cerumen impaction out of the right ear with a plastic loop.  It was painful and we stopped.  No bleeding  Cortisporin eardrops prescribed just in case.  Use Debrox wax removing At home  She ENT if needed     Cervical spinal cord compression 05/04/2021   Cervical spine instability 05/15/2021   Dr. Unice: plan -  posterior C1-2 decompression and instrumented fusion.  MRI: Progressive soft tissue pannus at C1-2 is now creating mass  effect on the craniocervical junction with distortion of the upper  spinal cord. This is likely related rheumatoid arthritis  C spine CT: Fracture of the anterior arch of C1 with 12 mm separation. Nondisplaced fracture posterior arch of C1. There is later   Cervical spondylosis 01/05/2019   Chalazion left upper eyelid 04/18/2023   X2  Keflex  po - too big... Will use Ceftin   F/u w/Ophthalmology   CHF (congestive heart failure)    Cholelithiasis 09/09/2019   Per Dr Abran: At this point we mutually decided to watch her abdominal complaints.  If symptoms accelerate or become more classic for symptomatic cholelithiasis, then she is agreeable to surgical referral.  She will keep me posted   Chronic left SI joint pain 11/30/2022   Closed fracture of first cervical vertebra 08/07/2021   Closed fracture of second cervical vertebra 08/07/2021   Concussion 10/29/2021   s/p a hard fall after she rushed and slipped on the wet marble floor while visiting family in [place]  a couple week ago.  She hit the side of her skull, had a skin laceration bled quite a bit.  She was dazed and according to her son [name] had a loss of consciousness of under a minute duration.  She felt dazed.  She was taken to ER.  2 staples were  applied to her skin laceration on the left o   Constipation 11/11/2016   Chronic, multifactorial  Miralax  prn  7/22 Postop constipation related to oxycodone  and Robaxin .  We can discontinue oxycodone  Robaxin .  The patient will use tramadol  and Tylenol  instead.  Use MiraLAX  or Senokot as to produce soft regular stools.  Discontinue Pepto-Bismol.  I think this should help with abdominal bloating complaint.      Take Metamucil daily  Senokot as to produce soft regular sto   Contact dermatitis and eczema 06/14/2018   Eczema vs other - 7/19  Cortaid prn  Depo-medrol  IM 80 mg  Remove nail polish     Contusion of left chest wall 01/09/2015   Johnston fell and broke a rib #8 on the L last week, she had a rib X ray and a CT (Dr Jane).   Contusion of right knee 08/12/2016   R knee   Degenerative disc disease, cervical 03/11/2021   Duodenitis 09/03/2008   Essential hypertension, benign 07/27/2011   Chronic, mild  NAS diet  Cardiac CT scan for calcium  scoring offered 1/20  1/24 HTN and s/p ER visit on 12/08/22 for SBP 180. Per Dr Randol:  Discussed with the patient that her restarting donepezil  could have impacted her symptoms today.  However, her blood pressure continues to be mildly elevated.  Will place her on 2.5 mg amlodipine  to take if her blood pressure is over 170/100.  Nursing aide at   Fatigue 09/08/2014   10/15, 2/16, 9/17, 2/18  No other sx  Devaney is managing it with short rest periods 1-2/d     Fibroid    Gait disorder 02/28/2022   Worse  Multifactorial  Cont w/PT  In the balance class  Balance class q 1 week     [name] resolved after doing exercises by Dr Harvey 11/2021     Generalized anxiety disorder 08/22/2018   Chronic   Lexapro  - not taking  Weighted blanket  Lorazepam  prn, - tolerance has developed   Potential benefits of a long term benzodiazepines  use as well as potential risks  and complications were explained to the patient and were aknowledged.     GERD (gastroesophageal reflux disease)  11/20/2007   Chronic. Better on Gluten free diet  Dexilant    Potential benefits of a long term PPI  use as well as potential risks  and complications were explained to the patient and were aknowledged.  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     History of basal cell carcinoma 09/29/2022   History of colonic polyps 09/04/2008   Hyperlipemia, mixed 03/15/2011   Chronic  On Simvastatin      Hypotension 07/13/2023   Stop Amlodipine      Hypothyroidism    Impacted cerumen of right ear 07/16/2013   Increased frequency of urination 08/19/2014   Inflammation of sacroiliac joint 11/16/2022   Insomnia 01/24/2013   Lorazepam  prn   Potential benefits of a long term benzodiazepines  use as well as potential risks  and complications were explained to the patient and were aknowledged.        Irritable bowel syndrome 09/04/2008   Chronic   Abran MD, Norleen SAILOR  2013 - much better on gluten free diet              Lactose intolerance 11/20/2007   Lactaid        Lumbar degenerative disc disease 12/21/2022   Based on MRI and her exam there is significant DDD in lumbar spine     Lumbar spondylosis    Melanocytic nevi of trunk 02/22/2022   Meningioma 03/25/2020   Dr Unice     Mild intermittent asthma 08/07/2021   Mild neurocognitive disorder with memory loss 09/15/2023   Multiple thyroid  nodules 10/04/2011  2013 she was started on Synthroid  - tol well  2017, 2022 US  OK     MVP (mitral valve prolapse)    Antibiotics required for dental procedures   Neck pain 12/26/2018   Chronic  PT, traction, TENs unit was offered  Tramadol  prn   Potential benefits of a long term opioids use as well as potential risks (i.e. addiction risk, apnea etc) and complications (i.e. Somnolence, constipation and others) were explained to the patient  and were aknowledged.        OAB (overactive bladder) 09/02/2010   Dr Nicholaus  Use Solifenacin  prn  Worse - ?multifactorial  UA was normal  Camie increased Memantine  to 10 mg bid a month ago     Osteoarthritis 02/25/2008   Dr Bonner  Blue-Emu cream was recommended to use 2-3 times a day        Osteoporosis 11/20/2007   Chronic  Per GYN        Overactive bladder    Pain of right sacroiliac joint 01/14/2023   Postherpetic neuralgia 10/02/2013   Pruritus of skin 03/11/2012   S/p H zoster vaccination  Relapsing  8/12 - on face  8/14 - R ear/face  10/14 ?     Sacroiliitis    Seborrheic dermatitis 02/22/2022   Sinusitis, chronic 08/06/2016   Worse  Flonase , Atrovent  nasal, Singulair , Claritin   ENT ref Dr Thaddeus  CT sinuses     Skin ulcer of nose 07/13/2023   Soft pads on the nose  Derm appt     Spondylolisthesis, cervical region 03/11/2021   Stool incontinence 11/16/2022   12/23  Worse.  Stool leakage - new  D/c Aricept   Abd X ray to rule out constipation     Tibialis tendinitis 03/20/2008   Torn meniscus    bilateral   Trochanteric bursitis of right hip 09/22/2020   Upper respiratory infection 08/03/2012   9/13 - 3 wks  2/17  11/18 - asthmatic bronchitis     Urinary urgency 08/19/2014   2023 worse.  Obtain urinalysis.  D/c Aricept   UA was ok  On Premarin cream PV per GYN  On Vesicare   F/u w/Dr JONELLE Nicholaus     Urticaria 02/22/2022    Past Surgical History:  Procedure Laterality Date   CATARACT EXTRACTION, BILATERAL     EYE SURGERY     POSTERIOR CERVICAL FUSION/FORAMINOTOMY N/A 05/15/2021   Procedure: Cervical One Laminectomy with resection of cyst, Fixation from Occiput to Cervical Four;  Surgeon: Unice Pac, MD;  Location: [facility] OR;  Service: Neurosurgery;  Laterality: N/A;   TONSILLECTOMY      Current Outpatient Medications  Medication Sig Dispense Refill   acetaminophen  (TYLENOL ) 500 MG tablet Take 1,000 mg by mouth every 6 (six) hours as needed for mild pain.     albuterol  (VENTOLIN   HFA) 108 (90 Base) MCG/ACT inhaler Take 2 puffs every 4-6 hours as needed     amLODipine  (NORVASC ) 2.5 MG tablet Take 2.5 mg by mouth as needed.     Ascorbic Acid (VITAMIN C ) 100 MG tablet Take 1 tablet (100 mg total) by mouth daily. 100 tablet 1   azelastine  (ASTELIN ) 0.1 % nasal spray 1 spray in each nostril Nasally Once a day in the evening     Calcium  Carbonate-Vitamin D  (CALCIUM  CARBONATE W/VITAMIN D  PO) Take 1 tablet by mouth daily.     cyanocobalamin  (VITAMIN B12) 1000 MCG tablet Take 1 tablet (1,000 mcg total) by mouth daily. 100 tablet 3   desonide  (DESOWEN )  0.05 % cream Apply 1 application topically as needed.     EPINEPHrine  (EPIPEN  2-PAK) 0.3 mg/0.3 mL IJ SOAJ injection Inject 0.3 mLs (0.3 mg total) into the muscle as needed. 1 Device 1   erythromycin  ophthalmic ointment Place 1 Application into both eyes as needed.     famotidine  (PEPCID ) 40 MG tablet Take 40 mg by mouth as needed for heartburn or indigestion.     fluticasone  (FLONASE ) 50 MCG/ACT nasal spray Place 2 sprays into both nostrils daily. 16 g 6   guaiFENesin -dextromethorphan (ROBITUSSIN DM) 100-10 MG/5ML syrup Take 5-10 mLs by mouth every 4 (four) hours as needed for cough (chest congestion). 240 mL 0   hydrocortisone  (ANUSOL -HC) 2.5 % rectal cream Place 1 Application rectally as needed for hemorrhoids or anal itching.     ipratropium (ATROVENT ) 0.06 % nasal spray Place 2 sprays into both nostrils 2 (two) times daily as needed for rhinitis.     Lactase (LACTAID PO) Take by mouth.     levocetirizine (XYZAL ) 5 MG tablet Take 1 tablet (5 mg total) by mouth daily. 90 tablet 3   levothyroxine  (SYNTHROID ) 50 MCG tablet TAKE ONE TABLET BY MOUTH DAILY BEFORE BREAKFAST 30 tablet 5   LORazepam  (ATIVAN ) 1 MG tablet Take 2-3 tablets at bedtime and 1-2 tablets in the daytime as needed. 150 tablet 2   memantine  (NAMENDA  XR) 21 MG CP24 24 hr capsule Take 1 capsule (21 mg total) by mouth at bedtime. 90 capsule 3   Metamucil Fiber 2 g CHEW  Chew 2 g by mouth 2 (two) times a week. 24 tablet 1   MYRBETRIQ  50 MG TB24 tablet SMARTSIG:1 Tablet(s) By Mouth     polyethylene glycol powder (GLYCOLAX /MIRALAX ) 17 GM/SCOOP powder Take 17 g by mouth 2 (two) times daily as needed for moderate constipation. 500 g 3   UNABLE TO FIND Med Name: CBD oil - under tongue     Magnesium  Sulfate (EPSOM SALT) POWD UAD for warm water soaks (Patient not taking: Reported on 11/26/2024) 500 g 0   MegaRed Omega-3 Krill Oil 500 MG CAPS Take 1 capsule by mouth every morning. (Patient not taking: Reported on 11/26/2024) 100 capsule 3   montelukast  (SINGULAIR ) 10 MG tablet SMARTSIG:1 Tablet(s) By Mouth Every Evening     pantoprazole  (PROTONIX ) 40 MG tablet TAKE ONE TABLET BY MOUTH TWICE DAILY BEFORE MEALS (Patient not taking: Reported on 11/26/2024) 60 tablet 5   psyllium (METAMUCIL) 58.6 % packet Take 1 packet by mouth as needed. (Patient not taking: Reported on 11/26/2024)     saccharomyces boulardii (FLORASTOR) 250 MG capsule Take 1 capsule (250 mg total) by mouth 2 (two) times daily. 30 capsule 2   triamcinolone  ointment (KENALOG ) 0.1 % Apply 1 Application topically 3 (three) times daily. As needed for itching (Patient not taking: Reported on 11/26/2024) 80 g 2   No current facility-administered medications for this visit.     ALLERGIES: Bee venom, Bacitracin -polymyxin b, Clarithromycin, Diclofenac , Neosporin [neomycin -bacitracin  zn-polymyx], and Hydrocortisone   Family History  Problem Relation Age of Onset   Heart disease Mother    Hypertension Mother    Osteoporosis Mother    Congestive Heart Failure Mother    Pancreatic cancer Father    Heart attack Brother    Drug abuse Brother        over dose    Healthy Son    Healthy Son    Colon cancer Neg Hx    Colon polyps Neg Hx    Rectal cancer Neg  Hx    Stomach cancer Neg Hx    Breast cancer Neg Hx     Social History   Socioeconomic History   Marital status: Married    Spouse name: Not on file    Number of children: 2   Years of education: 15   Highest education level: Some college, no degree  Occupational History   Occupation: retired    Associate Professor: RETIRED  Tobacco Use   Smoking status: Former    Current packs/day: 0.00    Types: Cigarettes    Start date: 1963    Quit date: 1973    Years since quitting: 53.0    Passive exposure: Never   Smokeless tobacco: Never   Tobacco comments:    Former smoker 06/25/22  Vaping Use   Vaping status: Never Used  Substance and Sexual Activity   Alcohol use: Yes    Alcohol/week: 1.0 standard drink of alcohol    Types: 1 Glasses of wine per week    Comment: Socially   Drug use: No   Sexual activity: Not Currently    Birth control/protection: Post-menopausal, None    Comment: 1st intercourse 89 yo-Fewer than 5 partners  Other Topics Concern   Not on file  Social History Narrative   Regular exercise-yesDaily caffeine use   Right handed   Lives alone   retired   Social Drivers of Health   Tobacco Use: Medium Risk (12/03/2024)   Patient History    Smoking Tobacco Use: Former    Smokeless Tobacco Use: Never    Passive Exposure: Never  Physicist, Medical Strain: Low Risk (05/07/2024)   Overall Financial Resource Strain (CARDIA)    Difficulty of Paying Living Expenses: Not hard at all  Food Insecurity: Low Risk (06/04/2024)   Received from [facility]   Epic    Within the past 12 months, you worried that your food would run out before you got money to buy more: Never true    Within the past 12 months, the food you bought just didn't last and you didn't have money to get more. : Never true  Transportation Needs: No Transportation Needs (06/04/2024)   Received from [facility]    In the past 12 months, has lack of reliable transportation kept you from medical appointments, meetings, work or from getting things needed for daily living? : No  Physical Activity: Sufficiently Active (05/07/2024)   Exercise Vital Sign     Days of Exercise per Week: 5 days    Minutes of Exercise per Session: 30 min  Stress: No Stress Concern Present (05/07/2024)   [employer] of Occupational Health - Occupational Stress Questionnaire    Feeling of Stress : Not at all  Social Connections: Moderately Integrated (05/07/2024)   Social Connection and Isolation Panel    Frequency of Communication with Friends and Family: Twice a week    Frequency of Social Gatherings with Friends and Family: Twice a week    Attends Religious Services: More than 4 times per year    Active Member of [employer] or Organizations: Yes    Attends Banker Meetings: More than 4 times per year    Marital Status: Widowed  Intimate Partner Violence: Not At Risk (05/07/2024)   Humiliation, Afraid, Rape, and Kick questionnaire    Fear of Current or Ex-Partner: No    Emotionally Abused: No    Physically Abused: No    Sexually Abused: No  Depression (PHQ2-9): Low Risk (05/22/2024)  Depression (PHQ2-9)    PHQ-2 Score: 1  Recent Concern: Depression (PHQ2-9) - High Risk (04/02/2024)   Depression (PHQ2-9)    PHQ-2 Score: 11  Alcohol Screen: Low Risk (05/07/2024)   Alcohol Screen    Last Alcohol Screening Score (AUDIT): 1  Housing: Low Risk (06/04/2024)   Received from [facility]   Epic    What is your living situation today?: I have a steady place to live    Think about the place you live. Do you have problems with any of the following? Choose all that apply:: None/None on this list  Utilities: Low Risk (06/04/2024)   Received from [facility]   Utilities    In the past 12 months has the electric, gas, oil, or water company threatened to shut off services in your home? : No  Health Literacy: Adequate Health Literacy (05/07/2024)   B1300 Health Literacy    Frequency of need for help with medical instructions: Never    Review of Systems  All other systems reviewed and are negative.   PHYSICAL EXAMINATION:   BP 120/72 (BP Location: Right Arm,  Patient Position: Sitting)   Pulse 81   SpO2 97%     General appearance: alert, cooperative and appears stated age   Pelvic: External genitalia:  no lesions              Urethra:  normal appearing urethra with no masses, tenderness or lesions              Bartholins and Skenes: normal                 Vagina: normal appearing vagina with normal color and discharge, no lesions              Cervix: no lesions                Bimanual Exam:  Uterus:  normal size, contour, position, consistency, mobility, non-tender              Adnexa: no mass, fullness, tenderness              Rectal exam: external hemorrhoid.   Chaperone was present for exam:  Kari HERO, CMA  ASSESSMENT:  Vaginal pain.  Improved.  Off vaginal estrogen and gabapentin  use.  External hemorrhoid. Fecal incontinence. Allergy to hydrocortisone .   PLAN:  Stop Gabapentin . Gabapentin  and vaginal estrogen removed from medication list. Use Aquafor as needed. Rx for metamucil chews twice weekly.  FU for Prolia  in March and then breast and pelvic exam in May.  Call for any vaginal bleeding.    30 min  total time was spent for this patient encounter, including preparation, face-to-face counseling with the patient, coordination of care, and documentation of the encounter.    "

## 2024-12-03 NOTE — Telephone Encounter (Signed)
 Message received from [place] at [facility]. Rx received for Metamucil 2 g chew twice weekly.   Advised they do not have Metamucil 2 gram chew. They have Fiber Gummy 5 mg per serving, can this be supplemented twice weekly?   Call back number [phone]

## 2024-12-03 NOTE — Telephone Encounter (Signed)
 Because the Metamucil chews are not available, my plan is to have her take Metamucil (psyllium) 2 capsules by mouth two days per week in order to simplify the dosage of the medication which is intended to treat constipation and fecal incontinence.   I hope this clarifies.

## 2024-12-04 MED ORDER — METAMUCIL 0.36 G PO CAPS: ORAL_CAPSULE | ORAL | 1 refills | Status: DC

## 2024-12-04 NOTE — Telephone Encounter (Signed)
 Spoke with [name] at [facility]. Rx for Metamucil Fiber 2 gram chew discontinued.   Advised of new Rx sent per Dr. Nikki.  Was advised Pharmacist Luke is not in yet, will provide update. Advised I will call patient to notify.   Spoke with patient, was advised [name] is not with her yet. Patient request to go ahead and review recommendations. Advised of medication changes per Dr. Nikki. Patient states she has concerns about the size of the capsule, states she will f/u with the pharmacy to get estimate of size prior to having medication delivered. Patient states if capsule is too large, she would prefer a chew, gummy or powder. Patient aware to call if she needs additional assistance. Patient read back instructions.   Routing FYI.

## 2024-12-04 NOTE — Telephone Encounter (Signed)
 Patient left message on triage line. States she spoke with [facility]. Patient states Metamucil capsules are too large for her to swallow. Patient is asking for recommendations for gummy option or powder.

## 2024-12-05 MED ORDER — FIBER GUMMIES 2 G PO CHEW: 4.0000 g | CHEWABLE_TABLET | ORAL | 1 refills | Status: AC

## 2024-12-05 NOTE — Telephone Encounter (Signed)
 Spoke with [name] at [facility].  Fiber gummies are 5 grams of fiber per 3 gummies. Recommended serving is 1-3 gummies per day.   2 gummies roughly 4 grams of fiber.   Patient has contacted [facility], requested Gummies to go out with her delivery today.

## 2024-12-05 NOTE — Telephone Encounter (Signed)
 Seems best to proceed with fiber gummies twice a week as the recommended option.   Rx for 3 months and one refill.  Please send to [facility] and communicate to patient.

## 2024-12-05 NOTE — Telephone Encounter (Signed)
 Ok for fiber gummies, 2 gummies (approximately 4 grams total) by mouth a day for 2 days per week.   Disp:  24 RF 1.   Thank you.

## 2024-12-05 NOTE — Telephone Encounter (Signed)
 Spoke with [name] at [facility]. Advised per Dr. Nikki. Read back and confirmed.   Spoke with patient. Advised of Fiber chew recommendations per Dr. Nikki. Patient read back and confirmed.   Encounter closed.

## 2024-12-05 NOTE — Telephone Encounter (Signed)
 Dr. Nikki -can you confirm dosage for the fiber gummies?  [facility] has Fiber Gummy 5mg  per serving.

## 2024-12-05 NOTE — Telephone Encounter (Signed)
 Can you confirm the dosage for the gummy? I was expecting it may be 5 grams, and that she would take one by mouth twice a week.   Thank you for assisting!

## 2024-12-10 ENCOUNTER — Telehealth: Payer: Self-pay

## 2024-12-10 NOTE — Telephone Encounter (Signed)
 Copied from CRM #[id number]. Topic: Clinical - Medication Question >> Dec 10, 2024  2:00 PM Aleatha C wrote: Reason for RMF:[name] has been taking the  Aquaphor and Dr Carolynn said to take estradiol  and patient wanted to know if its ok to take both and patient is still having a little discomfort [phone]

## 2024-12-13 ENCOUNTER — Ambulatory Visit: Payer: Self-pay

## 2024-12-13 ENCOUNTER — Institutional Professional Consult (permissible substitution): Admitting: Psychology

## 2024-12-17 ENCOUNTER — Encounter: Payer: Self-pay | Admitting: Rheumatology

## 2024-12-17 ENCOUNTER — Ambulatory Visit: Admitting: Rheumatology

## 2024-12-17 VITALS — BP 124/76 | HR 80 | Temp 97.8°F | Resp 13 | Ht 60.0 in | Wt 104.8 lb

## 2024-12-17 DIAGNOSIS — G8929 Other chronic pain: Secondary | ICD-10-CM | POA: Diagnosis present

## 2024-12-17 DIAGNOSIS — G5703 Lesion of sciatic nerve, bilateral lower limbs: Secondary | ICD-10-CM | POA: Diagnosis not present

## 2024-12-17 DIAGNOSIS — I10 Essential (primary) hypertension: Secondary | ICD-10-CM | POA: Diagnosis not present

## 2024-12-17 DIAGNOSIS — Z8639 Personal history of other endocrine, nutritional and metabolic disease: Secondary | ICD-10-CM | POA: Diagnosis not present

## 2024-12-17 DIAGNOSIS — E034 Atrophy of thyroid (acquired): Secondary | ICD-10-CM | POA: Diagnosis present

## 2024-12-17 DIAGNOSIS — M51369 Other intervertebral disc degeneration, lumbar region without mention of lumbar back pain or lower extremity pain: Secondary | ICD-10-CM | POA: Insufficient documentation

## 2024-12-17 DIAGNOSIS — M19071 Primary osteoarthritis, right ankle and foot: Secondary | ICD-10-CM | POA: Diagnosis not present

## 2024-12-17 DIAGNOSIS — M533 Sacrococcygeal disorders, not elsewhere classified: Secondary | ICD-10-CM | POA: Insufficient documentation

## 2024-12-17 DIAGNOSIS — M19072 Primary osteoarthritis, left ankle and foot: Secondary | ICD-10-CM | POA: Insufficient documentation

## 2024-12-17 DIAGNOSIS — Z8719 Personal history of other diseases of the digestive system: Secondary | ICD-10-CM | POA: Insufficient documentation

## 2024-12-17 DIAGNOSIS — M19041 Primary osteoarthritis, right hand: Secondary | ICD-10-CM | POA: Insufficient documentation

## 2024-12-17 DIAGNOSIS — R2681 Unsteadiness on feet: Secondary | ICD-10-CM | POA: Insufficient documentation

## 2024-12-17 DIAGNOSIS — M5030 Other cervical disc degeneration, unspecified cervical region: Secondary | ICD-10-CM | POA: Insufficient documentation

## 2024-12-17 DIAGNOSIS — M19042 Primary osteoarthritis, left hand: Secondary | ICD-10-CM | POA: Diagnosis present

## 2024-12-17 DIAGNOSIS — M810 Age-related osteoporosis without current pathological fracture: Secondary | ICD-10-CM | POA: Diagnosis not present

## 2024-12-17 DIAGNOSIS — M62830 Muscle spasm of back: Secondary | ICD-10-CM | POA: Insufficient documentation

## 2024-12-17 NOTE — Telephone Encounter (Signed)
 Please inform pt of PCP advise as follows as pts VM is full unable to lvm at this time.  Yes, it is okay to use both. Thanks

## 2024-12-17 NOTE — Patient Instructions (Addendum)
 Hand Exercises Hand exercises can be helpful for almost anyone. They can strengthen your hands and improve flexibility and movement. The exercises can also increase blood flow to the hands. These results can make your work and daily tasks easier for you. Hand exercises can be especially helpful for people who have joint pain from arthritis or nerve damage from using their hands over and over. These exercises can also help people who injure a hand. Exercises Most of these hand exercises are gentle stretching and motion exercises. It is usually safe to do them often throughout the day. Warming up your hands before exercise may help reduce stiffness. You can do this with gentle massage or by placing your hands in warm water for 10-15 minutes. It is normal to feel some stretching, pulling, tightness, or mild discomfort when you begin new exercises. In time, this will improve. Remember to always be careful and stop right away if you feel sudden, very bad pain or your pain gets worse. You want to get better and be safe. Ask your health care provider which exercises are safe for you. Do exercises exactly as told by your provider and adjust them as told. Do not begin these exercises until told by your provider. Knuckle bend or claw fist  Stand or sit with your arm, hand, and all five fingers pointed straight up. Make sure to keep your wrist straight. Gently bend your fingers down toward your palm until the tips of your fingers are touching your palm. Keep your big knuckle straight and only bend the small knuckles in your fingers. Hold this position for 10 seconds. Straighten your fingers back to your starting position. Repeat this exercise 5-10 times with each hand. Full finger fist  Stand or sit with your arm, hand, and all five fingers pointed straight up. Make sure to keep your wrist straight. Gently bend your fingers into your palm until the tips of your fingers are touching the middle of your  palm. Hold this position for 10 seconds. Extend your fingers back to your starting position, stretching every joint fully. Repeat this exercise 5-10 times with each hand. Straight fist  Stand or sit with your arm, hand, and all five fingers pointed straight up. Make sure to keep your wrist straight. Gently bend your fingers at the big knuckle, where your fingers meet your hand, and at the middle knuckle. Keep the knuckle at the tips of your fingers straight and try to touch the bottom of your palm. Hold this position for 10 seconds. Extend your fingers back to your starting position, stretching every joint fully. Repeat this exercise 5-10 times with each hand. Tabletop  Stand or sit with your arm, hand, and all five fingers pointed straight up. Make sure to keep your wrist straight. Gently bend your fingers at the big knuckle, where your fingers meet your hand, as far down as you can. Keep the small knuckles in your fingers straight. Think of forming a tabletop with your fingers. Hold this position for 10 seconds. Extend your fingers back to your starting position, stretching every joint fully. Repeat this exercise 5-10 times with each hand. Finger spread  Place your hand flat on a table with your palm facing down. Make sure your wrist stays straight. Spread your fingers and thumb apart from each other as far as you can until you feel a gentle stretch. Hold this position for 10 seconds. Bring your fingers and thumb tight together again. Hold this position for 10 seconds. Repeat  this exercise 5-10 times with each hand. Making circles  Stand or sit with your arm, hand, and all five fingers pointed straight up. Make sure to keep your wrist straight. Make a circle by touching the tip of your thumb to the tip of your index finger. Hold for 10 seconds. Then open your hand wide. Repeat this motion with your thumb and each of your fingers. Repeat this exercise 5-10 times with each hand. Thumb  motion  Sit with your forearm resting on a table and your wrist straight. Your thumb should be facing up toward the ceiling. Keep your fingers relaxed as you move your thumb. Lift your thumb up as high as you can toward the ceiling. Hold for 10 seconds. Bend your thumb across your palm as far as you can, reaching the tip of your thumb for the small finger (pinkie) side of your palm. Hold for 10 seconds. Repeat this exercise 5-10 times with each hand. Grip strengthening  Hold a stress ball or other soft ball in the middle of your hand. Slowly increase the pressure, squeezing the ball as much as you can without causing pain. Think of bringing the tips of your fingers into the middle of your palm. All of your finger joints should bend when doing this exercise. Hold your squeeze for 10 seconds, then relax. Repeat this exercise 5-10 times with each hand. Contact a health care provider if: Your hand pain or discomfort gets much worse when you do an exercise. Your hand pain or discomfort does not improve within 2 hours after you exercise. If you have either of these problems, stop doing these exercises right away. Do not do them again unless your provider says that you can. Get help right away if: You develop sudden, severe hand pain or swelling. If this happens, stop doing these exercises right away. Do not do them again unless your provider says that you can. This information is not intended to replace advice given to you by your health care provider. Make sure you discuss any questions you have with your health care provider. Document Revised: 11/30/2022 Document Reviewed: 11/30/2022 Elsevier Patient Education  2024 [employer]Low Back Sprain or Strain Rehab Ask your health care provider which exercises are safe for you. Do exercises exactly as told by your health care provider and adjust them as directed. It is normal to feel mild stretching, pulling, tightness, or discomfort as you do these  exercises. Stop right away if you feel sudden pain or your pain gets worse. Do not begin these exercises until told by your health care provider. Stretching and range-of-motion exercises These exercises warm up your muscles and joints and improve the movement and flexibility of your back. These exercises also help to relieve pain, numbness, and tingling. Lumbar rotation  Lie on your back on a firm bed or the floor with your knees bent. Straighten your arms out to your sides so each arm forms a 90-degree angle (right angle) with a side of your body. Slowly move (rotate) both of your knees to one side of your body until you feel a stretch in your lower back (lumbar). Try not to let your shoulders lift off the floor. Hold this position for __________ seconds. Tense your abdominal muscles and slowly move your knees back to the starting position. Repeat this exercise on the other side of your body. Repeat __________ times. Complete this exercise __________ times a day. Single knee to chest  Lie on your back on a firm  bed or the floor with both legs straight. Bend one of your knees. Use your hands to move your knee up toward your chest until you feel a gentle stretch in your lower back and buttock. Hold your leg in this position by holding on to the front of your knee. Keep your other leg as straight as possible. Hold this position for __________ seconds. Slowly return to the starting position. Repeat with your other leg. Repeat __________ times. Complete this exercise __________ times a day. Prone extension on elbows  Lie on your abdomen on a firm bed or the floor (prone position). Prop yourself up on your elbows. Use your arms to help lift your chest up until you feel a gentle stretch in your abdomen and your lower back. This will place some of your body weight on your elbows. If this is uncomfortable, try stacking pillows under your chest. Your hips should stay down, against the surface that  you are lying on. Keep your hip and back muscles relaxed. Hold this position for __________ seconds. Slowly relax your upper body and return to the starting position. Repeat __________ times. Complete this exercise __________ times a day. Strengthening exercises These exercises build strength and endurance in your back. Endurance is the ability to use your muscles for a Langlois time, even after they get tired. Pelvic tilt This exercise strengthens the muscles that lie deep in the abdomen. Lie on your back on a firm bed or the floor with your legs extended. Bend your knees so they are pointing toward the ceiling and your feet are flat on the floor. Tighten your lower abdominal muscles to press your lower back against the floor. This motion will tilt your pelvis so your tailbone points up toward the ceiling instead of pointing to your feet or the floor. To help with this exercise, you may place a small towel under your lower back and try to push your back into the towel. Hold this position for __________ seconds. Let your muscles relax completely before you repeat this exercise. Repeat __________ times. Complete this exercise __________ times a day. Alternating arm and leg raises  Get on your hands and knees on a firm surface. If you are on a hard floor, you may want to use padding, such as an exercise mat, to cushion your knees. Line up your arms and legs. Your hands should be directly below your shoulders, and your knees should be directly below your hips. Lift your left leg behind you. At the same time, raise your right arm and straighten it in front of you. Do not lift your leg higher than your hip. Do not lift your arm higher than your shoulder. Keep your abdominal and back muscles tight. Keep your hips facing the ground. Do not arch your back. Keep your balance carefully, and do not hold your breath. Hold this position for __________ seconds. Slowly return to the starting  position. Repeat with your right leg and your left arm. Repeat __________ times. Complete this exercise __________ times a day. Abdominal set with straight leg raise  Lie on your back on a firm bed or the floor. Bend one of your knees and keep your other leg straight. Tense your abdominal muscles and lift your straight leg up, 4-6 inches (10-15 cm) off the ground. Keep your abdominal muscles tight and hold this position for __________ seconds. Do not hold your breath. Do not arch your back. Keep it flat against the ground. Keep your abdominal muscles tense  as you slowly lower your leg back to the starting position. Repeat with your other leg. Repeat __________ times. Complete this exercise __________ times a day. Single leg lower with bent knees Lie on your back on a firm bed or the floor. Tense your abdominal muscles and lift your feet off the floor, one foot at a time, so your knees and hips are bent in 90-degree angles (right angles). Your knees should be over your hips and your lower legs should be parallel to the floor. Keeping your abdominal muscles tense and your knee bent, slowly lower one of your legs so your toe touches the ground. Lift your leg back up to return to the starting position. Do not hold your breath. Do not let your back arch. Keep your back flat against the ground. Repeat with your other leg. Repeat __________ times. Complete this exercise __________ times a day. Posture and body mechanics Good posture and healthy body mechanics can help to relieve stress in your body's tissues and joints. Body mechanics refers to the movements and positions of your body while you do your daily activities. Posture is part of body mechanics. Good posture means: Your spine is in its natural S-curve position (neutral). Your shoulders are pulled back slightly. Your head is not tipped forward (neutral). Follow these guidelines to improve your posture and body mechanics in your everyday  activities. Standing  When standing, keep your spine neutral and your feet about hip-width apart. Keep a slight bend in your knees. Your ears, shoulders, and hips should line up. When you do a task in which you stand in one place for a Kussman time, place one foot up on a stable object that is 2-4 inches (5-10 cm) high, such as a footstool. This helps keep your spine neutral. Sitting  When sitting, keep your spine neutral and keep your feet flat on the floor. Use a footrest, if necessary, and keep your thighs parallel to the floor. Avoid rounding your shoulders, and avoid tilting your head forward. When working at a desk or a computer, keep your desk at a height where your hands are slightly lower than your elbows. Slide your chair under your desk so you are close enough to maintain good posture. When working at a computer, place your monitor at a height where you are looking straight ahead and you do not have to tilt your head forward or downward to look at the screen. Resting When lying down and resting, avoid positions that are most painful for you. If you have pain with activities such as sitting, bending, stooping, or squatting, lie in a position in which your body does not bend very much. For example, avoid curling up on your side with your arms and knees near your chest (fetal position). If you have pain with activities such as standing for a Mikula time or reaching with your arms, lie with your spine in a neutral position and bend your knees slightly. Try the following positions: Lying on your side with a pillow between your knees. Lying on your back with a pillow under your knees. Lifting  When lifting objects, keep your feet at least shoulder-width apart and tighten your abdominal muscles. Bend your knees and hips and keep your spine neutral. It is important to lift using the strength of your legs, not your back. Do not lock your knees straight out. Always ask for help to lift heavy or  awkward objects. This information is not intended to replace advice given to  you by your health care provider. Make sure you discuss any questions you have with your health care provider. Document Revised: 03/21/2023 Document Reviewed: 02/02/2021 Elsevier Patient Education  2024 [employer].Cervical Strain and Sprain Rehab Ask your health care provider which exercises are safe for you. Do exercises exactly as told by your health care provider and adjust them as directed. It is normal to feel mild stretching, pulling, tightness, or discomfort as you do these exercises. Stop right away if you feel sudden pain or your pain gets worse. Do not begin these exercises until told by your health care provider. Stretching and range-of-motion exercises Cervical side bending  Using good posture, sit on a stable chair or stand up. Without moving your shoulders, slowly tilt your left / right ear to your shoulder until you feel a stretch in the neck muscles on the opposite side. You should be looking straight ahead. Hold for __________ seconds. Repeat with the other side of your neck. Repeat __________ times. Complete this exercise __________ times a day. Cervical rotation  Using good posture, sit on a stable chair or stand up. Slowly turn your head to the side as if you are looking over your left / right shoulder. Keep your eyes level with the ground. Stop when you feel a stretch along the side and the back of your neck. Hold for __________ seconds. Repeat this by turning to your other side. Repeat __________ times. Complete this exercise __________ times a day. Thoracic extension and pectoral stretch  Roll a towel or a small blanket so it is about 4 inches (10 cm) in diameter. Lie down on your back on a firm surface. Put the towel in the middle of your back across your spine. It should not be under your shoulder blades. Put your hands behind your head and let your elbows fall out to your sides. Hold  for __________ seconds. Repeat __________ times. Complete this exercise __________ times a day. Strengthening exercises Upper cervical flexion  Lie on your back with a thin pillow behind your head or a small, rolled-up towel under your neck. Gently tuck your chin toward your chest and nod your head down to look toward your feet. Do not lift your head off the pillow. Hold for __________ seconds. Release the tension slowly. Relax your neck muscles completely before you repeat this exercise. Repeat __________ times. Complete this exercise __________ times a day. Cervical extension  Stand about 6 inches (15 cm) away from a wall, with your back facing the wall. Place a soft object, about 6-8 inches (15-20 cm) in diameter, between the back of your head and the wall. A soft object could be a small pillow, a ball, or a folded towel. Gently tilt your head back and press into the soft object. Keep your jaw and forehead relaxed. Hold for __________ seconds. Release the tension slowly. Relax your neck muscles completely before you repeat this exercise. Repeat __________ times. Complete this exercise __________ times a day. Posture and body mechanics Body mechanics refer to the movements and positions of your body while you do your daily activities. Posture is part of body mechanics. Good posture and healthy body mechanics can help to relieve stress in your body's tissues and joints. Good posture means that your spine is in its natural S-curve position (your spine is neutral), your shoulders are pulled back slightly, and your head is not tipped forward. The following are general guidelines for using improved posture and body mechanics in your everyday activities.  Sitting  When sitting, keep your spine neutral and keep your feet flat on the floor. Use a footrest, if needed, and keep your thighs parallel to the floor. Avoid rounding your shoulders. Avoid tilting your head forward. When working at a desk or  a computer, keep your desk at a height where your hands are slightly lower than your elbows. Slide your chair under your desk so you are close enough to maintain good posture. When working at a computer, place your monitor at a height where you are looking straight ahead and you do not have to tilt your head forward or downward to look at the screen. Standing  When standing, keep your spine neutral and keep your feet about hip-width apart. Keep a slight bend in your knees. Your ears, shoulders, and hips should line up. When you do a task in which you stand in one place for a Beast[place] time, place one foot up on a stable object that is 2-4 inches (5-10 cm) high, such as a footstool. This helps keep your spine neutral. Resting When lying down and resting, avoid positions that are most painful for you. Try to support your neck in a neutral position. You can use a contour pillow or a small rolled-up towel. Your pillow should support your neck but not push on it. This information is not intended to replace advice given to you by your health care provider. Make sure you discuss any questions you have with your health care provider. Document Revised: 03/21/2023 Document Reviewed: 06/07/2022 Elsevier Patient Education  2024 [employer].

## 2024-12-17 NOTE — Telephone Encounter (Signed)
 Yes, it is okay to use both.  Thanks

## 2024-12-19 ENCOUNTER — Encounter: Payer: Self-pay | Admitting: Psychology

## 2024-12-21 ENCOUNTER — Telehealth: Payer: Self-pay

## 2024-12-21 NOTE — Telephone Encounter (Signed)
 Patient left a message on voicemail stating she is having discomfort in the vaginal area. She said she was told to use aquaphor and has been using that with no relief. Pt was last seen 12-03-24 and patients discomfort was improving.

## 2024-12-21 NOTE — Telephone Encounter (Signed)
 Spoke with patient. Patient reports discomfort in vaginal are has returned. Patient reports discomfort. Denies vaginal bleeding, odor, discharge, urinary symptoms, redness or itching.   Currently applying Aquaphor tid and using vaginal estrogen nightly.   Advised per review of 12/03/24 OV with Dr. Nikki, vaginal estrogen was removed from medication list.   Advised to hold off on using any additional vaginal estrogen, continue Aquaphor. Advised I will review plan of care with Dr. Nikki and f/u with recommendations. Advised patient Dr. Nikki is out of the office, response may not be immediate. Patient agreeable.   Routing to Dr. Nikki.

## 2024-12-23 ENCOUNTER — Encounter: Payer: Self-pay | Admitting: Internal Medicine

## 2024-12-23 NOTE — Assessment & Plan Note (Signed)
 Doing well except for constipation which is chronic-being treated

## 2024-12-25 NOTE — Telephone Encounter (Signed)
 Spoke with patient. Patient states she is at her Urology appt right now, unable to talk. Patient request call back. I did request patient to discuss symptoms to check urine. Patient states she would. Patient request call back in 2hrs.

## 2024-12-25 NOTE — Telephone Encounter (Signed)
 Vaginal estrogen caused increased discomfort, so it was discontinued.   Another form of hydration could be coconut oil.    This is coconut oil from the grocery store and is not available as a prescription.   I would want the patient to have a urinalysis and urine culture to be sure she does not have a bladder infection.   Is there any way that a provider at [facility] could do this?  The roads are not good for travel, so I would not recommend an office visit today.

## 2024-12-25 NOTE — Telephone Encounter (Signed)
 Spoke with patient, advised per Dr. Nikki.   Patient read back and wrote down instructions.   Patient states she was seen by urology today, patient is uncertain who she saw. Patient states she left a urine sample and had a pelvic exam, states she was advised everything looked good. Patient states she just woke from a nap, she would call back to provide additional information. Patient states she was accompanied to her appt, but they do not know what testing was completed.   Routing update to Dr. Nikki

## 2024-12-28 ENCOUNTER — Ambulatory Visit: Admitting: Radiology

## 2024-12-28 NOTE — Telephone Encounter (Signed)
 Spoke with patient. Patient arrived In office.  Spoke with patient and Omega.  Patient did not stop estradiol  and Aquaphor.  Did not start coconut oil.   Patient reports vaginal irritation, burning when urine touches the skin. Denies urinary frequency, urgency, flank pain, hematuria, vaginal bleeding, vaginal discharge and/or odor.   OV scheduled for 12/31/24 at 1530 with Dr. Nikki.   Patient seen by [facility], records release completed while on office and faxed to [facility], requested records from visit for appt on 12/31/24  [facility] and patient, not to use estradiol  vaginal cream or aquaphor. Patient will switch to coconut oil. Questions answered. Patient appreciative.   Routing to Dr. Nikki LIPS.

## 2024-12-28 NOTE — Telephone Encounter (Signed)
 Thank you for the update!

## 2024-12-28 NOTE — Telephone Encounter (Signed)
 Please schedule an office visit with me next week.  We can do a vaginitis check and reassess her vaginal pain.    I would like to have a copy of her visit with the urologist for my review.  She may be a candidate for use of vaginal vitamin E or for Intrarosa.

## 2024-12-31 ENCOUNTER — Ambulatory Visit: Admitting: Obstetrics and Gynecology

## 2024-12-31 ENCOUNTER — Ambulatory Visit: Admitting: Internal Medicine

## 2025-01-03 ENCOUNTER — Encounter: Payer: Self-pay | Admitting: Obstetrics and Gynecology

## 2025-01-03 ENCOUNTER — Ambulatory Visit: Admitting: Obstetrics and Gynecology

## 2025-01-03 VITALS — BP 120/68 | HR 90

## 2025-01-03 DIAGNOSIS — R300 Dysuria: Secondary | ICD-10-CM

## 2025-01-03 LAB — URINALYSIS, COMPLETE W/RFL CULTURE
Bacteria, UA: NONE SEEN /HPF
Bilirubin Urine: NEGATIVE
Glucose, UA: NEGATIVE
Hgb urine dipstick: NEGATIVE
Hyaline Cast: NONE SEEN /LPF
Ketones, ur: NEGATIVE
Leukocyte Esterase: NEGATIVE
Nitrites, Initial: NEGATIVE
Protein, ur: NEGATIVE
RBC / HPF: NONE SEEN /HPF (ref 0–2)
Specific Gravity, Urine: 1.015 (ref 1.001–1.035)
WBC, UA: NONE SEEN /HPF (ref 0–5)
pH: 6.5 (ref 5.0–8.0)

## 2025-01-03 LAB — NO CULTURE INDICATED

## 2025-01-03 MED ORDER — TRIAMCINOLONE ACETONIDE 0.1 % EX OINT: 1.0000 | TOPICAL_OINTMENT | Freq: Three times a day (TID) | CUTANEOUS | 1 refills | Status: AC

## 2025-01-03 NOTE — Progress Notes (Unsigned)
 "  GYNECOLOGY  VISIT   HPI: 89 y.o.   Married  Caucasian female   [phone] with No LMP recorded. Patient is postmenopausal.   here for: Vaginal irritation and itching near rectum. States vagina is feeling better. Using Vaseline on rectum.     The vagina is feeling better.    Using coconut oil, helpful for the vagina.  Avoiding irritants.     Triamcinolone  0.1%.   has old Rx.     No vaginal discharge.  Using pads.     No urinary incontinence.   Son, Ubaldo is present today for the visit.   Saw urology who represcribed vaginal estradiol  cream, which the patient is not using.   Patient is prescribed Myrbetriq .   Patient had a urinalysis which was negative.      GYNECOLOGIC HISTORY: No LMP recorded. Patient is postmenopausal. Contraception:  PMP Menopausal hormone therapy:  n/a Last 2 paps:  10/31/24 neg, 08/07/21 neg  History of abnormal Pap or positive HPV:  no Mammogram:  04/16/24 Breast Density Cat B, BIRADS Cat 1 neg         OB History     Gravida  3   Para  2   Term  2   Preterm      AB  1   Living  2      SAB  1   IAB      Ectopic      Multiple      Live Births                 Patient Active Problem List   Diagnosis Date Noted   Vaginal discomfort 10/21/2024   Paronychia of left middle finger 05/22/2024   Postnasal drip 04/30/2024   Episode of confusion 04/02/2024   Wheezing 01/24/2024   Statin intolerance 01/09/2024   Chronic low back pain 11/28/2023   Dyslipidemia 11/28/2023   Mild neurocognitive disorder with memory loss 09/15/2023   CHF (congestive heart failure)    Hypotension 07/13/2023   Chalazion left upper eyelid 04/18/2023   Bruising 04/06/2023   Pain of right sacroiliac joint 01/14/2023   Lumbar degenerative disc disease 12/21/2022   Chronic left SI joint pain 11/30/2022   Inflammation of sacroiliac joint 11/16/2022   Stool incontinence 11/16/2022   Grief 10/06/2022   History of basal cell carcinoma 09/29/2022   Stress  at home 07/21/2022   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   Gait disorder 02/28/2022   Melanocytic nevi of trunk 02/22/2022   Seborrheic dermatitis 02/22/2022   Urticaria 02/22/2022   Concussion 10/29/2021   Allergic rhinitis due to animal (cat) (dog) hair and dander 08/07/2021   Allergic rhinitis due to pollen 08/07/2021   Mild intermittent asthma 08/07/2021   Cervical spine instability 05/15/2021   Cervical spinal cord compression 05/04/2021   Degenerative disc disease, cervical 03/11/2021   Spondylolisthesis, cervical region 03/11/2021   Trochanteric bursitis of right hip 09/22/2020   Meningioma 03/25/2020   Cholelithiasis 09/09/2019   Cervical spondylosis 01/05/2019   Neck pain 12/26/2018   Generalized anxiety disorder 08/22/2018   Contact dermatitis and eczema 06/14/2018   Constipation 11/11/2016   Sinusitis, chronic 08/06/2016   Aortic valve sclerosis 03/12/2015   Fatigue 09/08/2014   Increased frequency of urination 08/19/2014   Urinary urgency 08/19/2014   Postherpetic neuralgia 10/02/2013   Insomnia 01/24/2013   Pruritus of skin 03/11/2012   MVP (mitral valve prolapse)    Multiple thyroid  nodules 10/04/2011   Essential hypertension, benign 07/27/2011  Vaginitis    Hyperlipemia, mixed 03/15/2011   OAB (overactive bladder) 09/02/2010   Hypothyroidism 03/03/2010   Irritable bowel syndrome 09/04/2008   History of colonic polyps 09/04/2008   Atrophic gastritis 09/03/2008   Duodenitis 09/03/2008   Tibialis tendinitis 03/20/2008   Osteoarthritis 02/25/2008   Lactose intolerance 11/20/2007   GERD (gastroesophageal reflux disease) 11/20/2007   Osteoporosis 11/20/2007    Past Medical History:  Diagnosis Date   Acute pain of right shoulder 11/28/2023   Voltaren  gel  X ray R shoulder  Medrol  pack  OK CBD use     Allergic rhinitis due to animal (cat) (dog) hair and dander 08/07/2021   Allergic rhinitis due to pollen 08/07/2021   Aortic valve sclerosis 03/12/2015    Atrophic gastritis 09/03/2008   Atrophic vaginitis    Baker's cyst    Left-Dr. Aluisio   Bladder pain 08/19/2014   Chronic  Take Vesicare  10 mg/d  UA was ok  On Premarin cream PV per GYN  On Vesicare   F/u w/Dr JONELLE Moats     Cataract    Dr. Octavia   Cerumen impaction 07/16/2013   8/14  08/2023  I tried to remove cerumen impaction out of the right ear with a plastic loop.  It was painful and we stopped.  No bleeding  Cortisporin eardrops prescribed just in case.  Use Debrox wax removing At home  She ENT if needed     Cervical spinal cord compression 05/04/2021   Cervical spine instability 05/15/2021   Dr. Unice: plan -  posterior C1-2 decompression and instrumented fusion.  MRI: Progressive soft tissue pannus at C1-2 is now creating mass  effect on the craniocervical junction with distortion of the upper  spinal cord. This is likely related rheumatoid arthritis  C spine CT: Fracture of the anterior arch of C1 with 12 mm separation. Nondisplaced fracture posterior arch of C1. There is later   Cervical spondylosis 01/05/2019   Chalazion left upper eyelid 04/18/2023   X2  Keflex  po - too big... Will use Ceftin   F/u w/Ophthalmology   CHF (congestive heart failure)    Cholelithiasis 09/09/2019   Per Dr Abran: At this point we mutually decided to watch her abdominal complaints.  If symptoms accelerate or become more classic for symptomatic cholelithiasis, then she is agreeable to surgical referral.  She will keep me posted   Chronic left SI joint pain 11/30/2022   Closed fracture of first cervical vertebra 08/07/2021   Closed fracture of second cervical vertebra 08/07/2021   Concussion 10/29/2021   s/p a hard fall after she rushed and slipped on the wet marble floor while visiting family in [place]  a couple week ago.  She hit the side of her skull, had a skin laceration bled quite a bit.  She was dazed and according to her son [name] had a loss of consciousness of under a minute duration.  She felt  dazed.  She was taken to ER.  2 staples were applied to her skin laceration on the left o   Constipation 11/11/2016   Chronic, multifactorial  Miralax  prn  7/22 Postop constipation related to oxycodone  and Robaxin .  We can discontinue oxycodone  Robaxin .  The patient will use tramadol  and Tylenol  instead.  Use MiraLAX  or Senokot as to produce soft regular stools.  Discontinue Pepto-Bismol.  I think this should help with abdominal bloating complaint.      Take Metamucil daily  Senokot as to produce soft regular sto   Contact dermatitis and  eczema 06/14/2018   Eczema vs other - 7/19  Cortaid prn  Depo-medrol  IM 80 mg  Remove nail polish     Contusion of left chest wall 01/09/2015   Shreeya fell and broke a rib #8 on the L last week, she had a rib X ray and a CT (Dr Jane).   Contusion of right knee 08/12/2016   R knee   Degenerative disc disease, cervical 03/11/2021   Duodenitis 09/03/2008   Essential hypertension, benign 07/27/2011   Chronic, mild  NAS diet  Cardiac CT scan for calcium  scoring offered 1/20  1/24 HTN and s/p ER visit on 12/08/22 for SBP 180. Per Dr Randol:  Discussed with the patient that her restarting donepezil  could have impacted her symptoms today.  However, her blood pressure continues to be mildly elevated.  Will place her on 2.5 mg amlodipine  to take if her blood pressure is over 170/100.  Nursing aide at   Fatigue 09/08/2014   10/15, 2/16, 9/17, 2/18  No other sx  Charlotte is managing it with short rest periods 1-2/d     Fibroid    Gait disorder 02/28/2022   Worse  Multifactorial  Cont w/PT  In the balance class  Balance class q 1 week     [name] resolved after doing exercises by Dr Harvey 11/2021     Generalized anxiety disorder 08/22/2018   Chronic   Lexapro  - not taking  Weighted blanket  Lorazepam  prn, - tolerance has developed   Potential benefits of a long term benzodiazepines  use as well as potential risks  and complications were explained to the patient and were aknowledged.      GERD (gastroesophageal reflux disease) 11/20/2007   Chronic. Better on Gluten free diet  Dexilant    Potential benefits of a long term PPI  use as well as potential risks  and complications were explained to the patient and were aknowledged.  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     History of basal cell carcinoma 09/29/2022   History of colonic polyps 09/04/2008   Hyperlipemia, mixed 03/15/2011   Chronic  On Simvastatin      Hypotension 07/13/2023   Stop Amlodipine      Hypothyroidism    Impacted cerumen of right ear 07/16/2013   Increased frequency of urination 08/19/2014   Inflammation of sacroiliac joint 11/16/2022   Insomnia 01/24/2013   Lorazepam  prn   Potential benefits of a long term benzodiazepines  use as well as potential risks  and complications were explained to the patient and were aknowledged.        Irritable bowel syndrome 09/04/2008   Chronic   Abran MD, Norleen SAILOR  2013 - much better on gluten free diet              Lactose intolerance 11/20/2007   Lactaid        Lumbar degenerative disc disease 12/21/2022   Based on MRI and her exam there is significant DDD in lumbar spine     Lumbar spondylosis    Melanocytic nevi of trunk 02/22/2022   Meningioma 03/25/2020   Dr Unice     Mild intermittent asthma 08/07/2021   Mild neurocognitive disorder with memory  loss 09/15/2023   Multiple thyroid  nodules 10/04/2011   2013 she was started on Synthroid  - tol well  2017, 2022 US  OK     MVP (mitral valve prolapse)    Antibiotics required for dental procedures   Neck pain 12/26/2018   Chronic  PT, traction, TENs unit was offered  Tramadol  prn   Potential benefits of a long term opioids use as well as potential risks (i.e. addiction risk, apnea etc) and complications (i.e. Somnolence, constipation  and others) were explained to the patient and were aknowledged.        OAB (overactive bladder) 09/02/2010   Dr Nicholaus  Use Solifenacin  prn  Worse - ?multifactorial  UA was normal  Camie increased Memantine  to 10 mg bid a month ago     Osteoarthritis 02/25/2008   Dr Bonner  Blue-Emu cream was recommended to use 2-3 times a day        Osteoporosis 11/20/2007   Chronic  Per GYN        Overactive bladder    Pain of right sacroiliac joint 01/14/2023   Postherpetic neuralgia 10/02/2013   Pruritus of skin 03/11/2012   S/p H zoster vaccination  Relapsing  8/12 - on face  8/14 - R ear/face  10/14 ?     Sacroiliitis    Seborrheic dermatitis 02/22/2022   Sinusitis, chronic 08/06/2016   Worse  Flonase , Atrovent  nasal, Singulair , Claritin   ENT ref Dr Thaddeus  CT sinuses     Skin ulcer of nose 07/13/2023   Soft pads on the nose  Derm appt     Spondylolisthesis, cervical region 03/11/2021   Stool incontinence 11/16/2022   12/23  Worse.  Stool leakage - new  D/c Aricept   Abd X ray to rule out constipation     Tibialis tendinitis 03/20/2008   Torn meniscus    bilateral   Trochanteric bursitis of right hip 09/22/2020   Upper respiratory infection 08/03/2012   9/13 - 3 wks  2/17  11/18 - asthmatic bronchitis     Urinary urgency 08/19/2014   2023 worse.  Obtain urinalysis.  D/c Aricept   UA was ok  On Premarin cream PV per GYN  On Vesicare   F/u w/Dr JONELLE Nicholaus     Urticaria 02/22/2022    Past Surgical History:  Procedure Laterality Date   CATARACT EXTRACTION, BILATERAL     EYE SURGERY     POSTERIOR CERVICAL FUSION/FORAMINOTOMY N/A 05/15/2021   Procedure: Cervical One Laminectomy with resection of cyst, Fixation from Occiput to Cervical Four;  Surgeon: Unice Pac, MD;  Location: [facility] OR;  Service: Neurosurgery;  Laterality: N/A;   TONSILLECTOMY      Current Outpatient Medications  Medication Sig Dispense Refill   acetaminophen  (TYLENOL ) 500 MG tablet Take 1,000 mg by mouth every 6 (six) hours as needed  for mild pain.     albuterol  (VENTOLIN  HFA) 108 (90 Base) MCG/ACT inhaler Take 2 puffs every 4-6 hours as needed     amLODipine  (NORVASC ) 2.5 MG tablet Take 2.5 mg by mouth as needed.     Ascorbic Acid (VITAMIN C ) 100 MG tablet Take 1 tablet (100 mg total) by mouth daily. 100 tablet 1   azelastine  (ASTELIN ) 0.1 % nasal spray 1 spray in each nostril Nasally Once a day in the evening     cyanocobalamin  (VITAMIN B12) 1000 MCG tablet Take 1 tablet (1,000 mcg total) by mouth daily. 100 tablet 3   EPINEPHrine  (EPIPEN  2-PAK) 0.3 mg/0.3 mL IJ SOAJ injection Inject  0.3 mLs (0.3 mg total) into the muscle as needed. 1 Device 1   erythromycin  ophthalmic ointment Place 1 Application into both eyes as needed.     famotidine  (PEPCID ) 40 MG tablet Take 40 mg by mouth as needed for heartburn or indigestion.     Fiber Gummies 2 g CHEW Chew 4 g by mouth 2 (two) times a week. 2 gummies 24 tablet 1   fluticasone  (FLONASE ) 50 MCG/ACT nasal spray Place 2 sprays into both nostrils daily. 16 g 6   hydrocortisone  (ANUSOL -HC) 2.5 % rectal cream Place 1 Application rectally as needed for hemorrhoids or anal itching.     ipratropium (ATROVENT ) 0.06 % nasal spray Place 2 sprays into both nostrils 2 (two) times daily as needed for rhinitis.     Lactase (LACTAID PO) Take by mouth.     levocetirizine (XYZAL ) 5 MG tablet Take 1 tablet (5 mg total) by mouth daily. 90 tablet 3   levothyroxine  (SYNTHROID ) 50 MCG tablet TAKE ONE TABLET BY MOUTH DAILY BEFORE BREAKFAST 30 tablet 5   LORazepam  (ATIVAN ) 1 MG tablet Take 2-3 tablets at bedtime and 1-2 tablets in the daytime as needed. 150 tablet 2   Magnesium  Sulfate (EPSOM SALT) POWD UAD for warm water soaks 500 g 0   MegaRed Omega-3 Krill Oil 500 MG CAPS Take 1 capsule by mouth every morning. 100 capsule 3   memantine  (NAMENDA  XR) 21 MG CP24 24 hr capsule Take 1 capsule (21 mg total) by mouth at bedtime. 90 capsule 3   montelukast  (SINGULAIR ) 10 MG tablet SMARTSIG:1 Tablet(s) By Mouth  Every Evening     polyethylene glycol powder (GLYCOLAX /MIRALAX ) 17 GM/SCOOP powder Take 17 g by mouth 2 (two) times daily as needed for moderate constipation. 500 g 3   Calcium  Carbonate-Vitamin D  (CALCIUM  CARBONATE W/VITAMIN D  PO) Take 1 tablet by mouth daily. (Patient not taking: Reported on 01/03/2025)     desonide  (DESOWEN ) 0.05 % cream Apply 1 application topically as needed. (Patient not taking: Reported on 01/03/2025)     guaiFENesin -dextromethorphan (ROBITUSSIN DM) 100-10 MG/5ML syrup Take 5-10 mLs by mouth every 4 (four) hours as needed for cough (chest congestion). (Patient not taking: Reported on 01/03/2025) 240 mL 0   MYRBETRIQ  50 MG TB24 tablet SMARTSIG:1 Tablet(s) By Mouth (Patient not taking: Reported on 01/03/2025)     pantoprazole  (PROTONIX ) 40 MG tablet TAKE ONE TABLET BY MOUTH TWICE DAILY BEFORE MEALS (Patient not taking: Reported on 01/03/2025) 60 tablet 5   saccharomyces boulardii (FLORASTOR) 250 MG capsule Take 1 capsule (250 mg total) by mouth 2 (two) times daily. (Patient not taking: Reported on 01/03/2025) 30 capsule 2   triamcinolone  ointment (KENALOG ) 0.1 % Apply 1 Application topically 3 (three) times daily. As needed for itching (Patient not taking: Reported on 01/03/2025) 80 g 2   UNABLE TO FIND Med Name: CBD oil - under tongue (Patient not taking: Reported on 01/03/2025)     No current facility-administered medications for this visit.     ALLERGIES: Bee venom, Bacitracin -polymyxin b, Clarithromycin, Diclofenac , Neosporin [neomycin -bacitracin  zn-polymyx], and Hydrocortisone   Family History  Problem Relation Age of Onset   Heart disease Mother    Hypertension Mother    Osteoporosis Mother    Congestive Heart Failure Mother    Pancreatic cancer Father    Heart attack Brother    Drug abuse Brother        over dose    Healthy Son    Healthy Son    Colon cancer Neg  Hx    Colon polyps Neg Hx    Rectal cancer Neg Hx    Stomach cancer Neg Hx    Breast cancer Neg Hx     Social  History   Socioeconomic History   Marital status: Married    Spouse name: Not on file   Number of children: 2   Years of education: 15   Highest education level: Some college, no degree  Occupational History   Occupation: retired    Associate Professor: RETIRED  Tobacco Use   Smoking status: Former    Current packs/day: 0.00    Types: Cigarettes    Start date: 1963    Quit date: 1973    Years since quitting: 53.1    Passive exposure: Never   Smokeless tobacco: Never   Tobacco comments:    Former smoker 06/25/22  Vaping Use   Vaping status: Never Used  Substance and Sexual Activity   Alcohol use: Yes    Alcohol/week: 1.0 standard drink of alcohol    Types: 1 Glasses of wine per week    Comment: Socially   Drug use: No   Sexual activity: Not Currently    Birth control/protection: Post-menopausal, None    Comment: 1st intercourse 89 yo-Fewer than 5 partners  Other Topics Concern   Not on file  Social History Narrative   Regular exercise-yesDaily caffeine use   Right handed   Lives alone   retired   Social Drivers of Health   Tobacco Use: Medium Risk (01/03/2025)   Patient History    Smoking Tobacco Use: Former    Smokeless Tobacco Use: Never    Passive Exposure: Never  Physicist, Medical Strain: Low Risk (05/07/2024)   Overall Financial Resource Strain (CARDIA)    Difficulty of Paying Living Expenses: Not hard at all  Food Insecurity: Low Risk (06/04/2024)   Received from [facility]   Epic    Within the past 12 months, you worried that your food would run out before you got money to buy more: Never true    Within the past 12 months, the food you bought just didn't last and you didn't have money to get more. : Never true  Transportation Needs: No Transportation Needs (06/04/2024)   Received from [facility]    In the past 12 months, has lack of reliable transportation kept you from medical appointments, meetings, work or from getting things needed for daily  living? : No  Physical Activity: Sufficiently Active (05/07/2024)   Exercise Vital Sign    Days of Exercise per Week: 5 days    Minutes of Exercise per Session: 30 min  Stress: No Stress Concern Present (05/07/2024)   [employer] of Occupational Health - Occupational Stress Questionnaire    Feeling of Stress : Not at all  Social Connections: Moderately Integrated (05/07/2024)   Social Connection and Isolation Panel    Frequency of Communication with Friends and Family: Twice a week    Frequency of Social Gatherings with Friends and Family: Twice a week    Attends Religious Services: More than 4 times per year    Active Member of [employer] or Organizations: Yes    Attends Banker Meetings: More than 4 times per year    Marital Status: Widowed  Intimate Partner Violence: Not At Risk (05/07/2024)   Humiliation, Afraid, Rape, and Kick questionnaire    Fear of Current or Ex-Partner: No    Emotionally Abused: No    Physically Abused:  No    Sexually Abused: No  Depression (PHQ2-9): Low Risk (05/22/2024)   Depression (PHQ2-9)    PHQ-2 Score: 1  Recent Concern: Depression (PHQ2-9) - High Risk (04/02/2024)   Depression (PHQ2-9)    PHQ-2 Score: 11  Alcohol Screen: Low Risk (05/07/2024)   Alcohol Screen    Last Alcohol Screening Score (AUDIT): 1  Housing: Low Risk (06/04/2024)   Received from [facility]   Epic    What is your living situation today?: I have a steady place to live    Think about the place you live. Do you have problems with any of the following? Choose all that apply:: None/None on this list  Utilities: Low Risk (06/04/2024)   Received from [facility]   Utilities    In the past 12 months has the electric, gas, oil, or water company threatened to shut off services in your home? : No  Health Literacy: Adequate Health Literacy (05/07/2024)   B1300 Health Literacy    Frequency of need for help with medical instructions: Never    Review of Systems  All other systems  reviewed and are negative.   PHYSICAL EXAMINATION:   BP 120/68 (BP Location: Right Arm, Patient Position: Sitting)   Pulse 90   SpO2 96%     General appearance: alert, cooperative and appears stated age  Pelvic: External genitalia:  no lesions              Urethra:  normal appearing urethra with no masses, tenderness or lesions              Bartholins and Skenes: normal.  3 mm cyst at right introitus, nontender.                 Vagina: normal appearing vagina with normal color and discharge, no lesions              Cervix: no lesions                Bimanual Exam:  Uterus:  normal size, contour, position, consistency, mobility, non-tender              Adnexa: no mass, fullness, tenderness              Rectal exam: yes.  Confirms.              Anus:  normal sphincter tone, no lesions  Chaperone was present for exam:  Kari HERO, CMA  ASSESSMENT:  Vaginal discomfort.  Improved.  Rectal itching.    PLAN:  Urinalysis:  sg 1.015, ph 6.5, Negative microscopic analysis.   No UC indicated.  Rx for triamcinolone  0.1%, apply tid prn.  We discussed soaps such as Dove Tip to Toe and [employer] Skin option. Use pads that are hypoallergenic, free of perfume or dye, and preferably organic with cotton.   FU for Prolia  in March and for breast and pelvic exam in May.     {LABS (Optional):23779}  ***  total time was spent for this patient encounter, including preparation, face-to-face counseling with the patient, coordination of care, and documentation of the encounter.    "

## 2025-01-07 ENCOUNTER — Ambulatory Visit: Admitting: Internal Medicine

## 2025-01-14 ENCOUNTER — Ambulatory Visit (HOSPITAL_COMMUNITY)

## 2025-01-14 ENCOUNTER — Ambulatory Visit (HOSPITAL_COMMUNITY): Admitting: Internal Medicine

## 2025-01-30 ENCOUNTER — Other Ambulatory Visit (HOSPITAL_BASED_OUTPATIENT_CLINIC_OR_DEPARTMENT_OTHER)

## 2025-02-04 ENCOUNTER — Ambulatory Visit

## 2025-04-17 ENCOUNTER — Encounter: Admitting: Obstetrics and Gynecology

## 2025-05-13 ENCOUNTER — Ambulatory Visit

## 2025-05-23 ENCOUNTER — Ambulatory Visit: Payer: Self-pay | Admitting: Physician Assistant

## 2025-06-19 ENCOUNTER — Ambulatory Visit: Admitting: Rheumatology
